# Patient Record
Sex: Female | Born: 1950 | Race: White | Hispanic: No | Marital: Married | Smoking: Never smoker
Health system: Southern US, Community
[De-identification: ages and names within clinical notes are randomized; demographics above are authoritative.]

## PROBLEM LIST (undated history)

## (undated) DIAGNOSIS — R112 Nausea with vomiting, unspecified: Secondary | ICD-10-CM

## (undated) DIAGNOSIS — Z860100 Personal history of colon polyps, unspecified: Secondary | ICD-10-CM

## (undated) DIAGNOSIS — M419 Scoliosis, unspecified: Secondary | ICD-10-CM

## (undated) DIAGNOSIS — G4733 Obstructive sleep apnea (adult) (pediatric): Secondary | ICD-10-CM

## (undated) DIAGNOSIS — Z9889 Other specified postprocedural states: Secondary | ICD-10-CM

## (undated) DIAGNOSIS — F329 Major depressive disorder, single episode, unspecified: Secondary | ICD-10-CM

## (undated) DIAGNOSIS — Z8619 Personal history of other infectious and parasitic diseases: Secondary | ICD-10-CM

## (undated) DIAGNOSIS — J690 Pneumonitis due to inhalation of food and vomit: Secondary | ICD-10-CM

## (undated) DIAGNOSIS — Z8669 Personal history of other diseases of the nervous system and sense organs: Secondary | ICD-10-CM

## (undated) DIAGNOSIS — K219 Gastro-esophageal reflux disease without esophagitis: Secondary | ICD-10-CM

## (undated) DIAGNOSIS — R519 Headache, unspecified: Secondary | ICD-10-CM

## (undated) DIAGNOSIS — I890 Lymphedema, not elsewhere classified: Secondary | ICD-10-CM

## (undated) DIAGNOSIS — F419 Anxiety disorder, unspecified: Secondary | ICD-10-CM

## (undated) DIAGNOSIS — F32A Depression, unspecified: Secondary | ICD-10-CM

## (undated) DIAGNOSIS — T7840XA Allergy, unspecified, initial encounter: Secondary | ICD-10-CM

## (undated) DIAGNOSIS — I10 Essential (primary) hypertension: Secondary | ICD-10-CM

## (undated) DIAGNOSIS — G35 Multiple sclerosis: Secondary | ICD-10-CM

## (undated) DIAGNOSIS — R51 Headache: Secondary | ICD-10-CM

## (undated) HISTORY — DX: Obstructive sleep apnea (adult) (pediatric): G47.33

## (undated) HISTORY — DX: Personal history of other infectious and parasitic diseases: Z86.19

## (undated) HISTORY — DX: Depression, unspecified: F32.A

## (undated) HISTORY — DX: Personal history of other diseases of the nervous system and sense organs: Z86.69

## (undated) HISTORY — DX: Major depressive disorder, single episode, unspecified: F32.9

## (undated) HISTORY — DX: Allergy, unspecified, initial encounter: T78.40XA

## (undated) HISTORY — DX: Headache, unspecified: R51.9

## (undated) HISTORY — DX: Multiple sclerosis: G35

## (undated) HISTORY — PX: BACK SURGERY: SHX140

## (undated) HISTORY — DX: Personal history of colonic polyps: Z86.010

## (undated) HISTORY — PX: CHOLECYSTECTOMY: SHX55

## (undated) HISTORY — DX: Headache: R51

## (undated) HISTORY — PX: HERNIA REPAIR: SHX51

## (undated) HISTORY — DX: Personal history of colon polyps, unspecified: Z86.0100

---

## 1999-12-18 ENCOUNTER — Ambulatory Visit (HOSPITAL_COMMUNITY): Admission: RE | Admit: 1999-12-18 | Discharge: 1999-12-18 | Payer: Self-pay | Admitting: Gastroenterology

## 2004-08-13 ENCOUNTER — Ambulatory Visit: Payer: Self-pay | Admitting: Unknown Physician Specialty

## 2005-06-26 ENCOUNTER — Ambulatory Visit: Payer: Self-pay | Admitting: Unknown Physician Specialty

## 2005-07-24 ENCOUNTER — Ambulatory Visit: Payer: Self-pay | Admitting: Unknown Physician Specialty

## 2005-09-30 ENCOUNTER — Ambulatory Visit: Payer: Self-pay | Admitting: Unknown Physician Specialty

## 2005-11-14 ENCOUNTER — Ambulatory Visit: Payer: Self-pay | Admitting: Unknown Physician Specialty

## 2006-10-13 HISTORY — PX: GALLBLADDER SURGERY: SHX652

## 2006-12-08 ENCOUNTER — Ambulatory Visit: Payer: Self-pay | Admitting: Unknown Physician Specialty

## 2007-08-13 ENCOUNTER — Ambulatory Visit: Payer: Self-pay | Admitting: Family Medicine

## 2007-08-13 ENCOUNTER — Inpatient Hospital Stay: Payer: Self-pay | Admitting: Surgery

## 2007-12-21 ENCOUNTER — Ambulatory Visit: Payer: Self-pay | Admitting: Family Medicine

## 2008-02-17 ENCOUNTER — Ambulatory Visit: Payer: Self-pay | Admitting: Surgery

## 2009-01-10 ENCOUNTER — Ambulatory Visit: Payer: Self-pay | Admitting: Unknown Physician Specialty

## 2009-10-13 DIAGNOSIS — G35 Multiple sclerosis: Secondary | ICD-10-CM

## 2009-10-13 HISTORY — DX: Multiple sclerosis: G35

## 2010-01-24 ENCOUNTER — Ambulatory Visit: Payer: Self-pay | Admitting: Unknown Physician Specialty

## 2011-03-04 ENCOUNTER — Ambulatory Visit: Payer: Self-pay | Admitting: Unknown Physician Specialty

## 2011-06-04 DIAGNOSIS — M79609 Pain in unspecified limb: Secondary | ICD-10-CM | POA: Insufficient documentation

## 2011-06-04 DIAGNOSIS — M545 Low back pain, unspecified: Secondary | ICD-10-CM | POA: Insufficient documentation

## 2011-06-05 DIAGNOSIS — G35 Multiple sclerosis: Secondary | ICD-10-CM | POA: Insufficient documentation

## 2011-07-15 ENCOUNTER — Ambulatory Visit: Payer: Self-pay | Admitting: Internal Medicine

## 2011-08-27 DIAGNOSIS — F32A Depression, unspecified: Secondary | ICD-10-CM | POA: Insufficient documentation

## 2012-01-28 DIAGNOSIS — Z0000 Encounter for general adult medical examination without abnormal findings: Secondary | ICD-10-CM | POA: Insufficient documentation

## 2012-01-28 DIAGNOSIS — E559 Vitamin D deficiency, unspecified: Secondary | ICD-10-CM | POA: Insufficient documentation

## 2012-01-28 DIAGNOSIS — E7800 Pure hypercholesterolemia, unspecified: Secondary | ICD-10-CM | POA: Insufficient documentation

## 2012-01-28 DIAGNOSIS — N951 Menopausal and female climacteric states: Secondary | ICD-10-CM | POA: Insufficient documentation

## 2012-02-25 LAB — HM PAP SMEAR: HM Pap smear: NEGATIVE

## 2012-03-04 ENCOUNTER — Ambulatory Visit: Payer: Self-pay | Admitting: Internal Medicine

## 2012-03-08 ENCOUNTER — Ambulatory Visit: Payer: Self-pay | Admitting: Internal Medicine

## 2012-03-31 DIAGNOSIS — E559 Vitamin D deficiency, unspecified: Secondary | ICD-10-CM | POA: Insufficient documentation

## 2012-07-28 ENCOUNTER — Ambulatory Visit: Payer: Self-pay | Admitting: Internal Medicine

## 2012-09-06 DIAGNOSIS — M549 Dorsalgia, unspecified: Secondary | ICD-10-CM | POA: Insufficient documentation

## 2012-09-06 DIAGNOSIS — I10 Essential (primary) hypertension: Secondary | ICD-10-CM | POA: Insufficient documentation

## 2012-10-05 ENCOUNTER — Other Ambulatory Visit: Payer: Self-pay | Admitting: Anesthesiology

## 2012-10-11 ENCOUNTER — Other Ambulatory Visit: Payer: Self-pay | Admitting: Neurosurgery

## 2012-10-11 ENCOUNTER — Ambulatory Visit
Admission: RE | Admit: 2012-10-11 | Discharge: 2012-10-11 | Disposition: A | Discharge status: Home / Self Care | Payer: BC Managed Care – PPO | Source: Ambulatory Visit | Attending: Neurosurgery | Admitting: Neurosurgery

## 2012-10-11 DIAGNOSIS — IMO0002 Reserved for concepts with insufficient information to code with codable children: Secondary | ICD-10-CM

## 2012-10-11 MED ORDER — IOHEXOL 300 MG/ML  SOLN
30.0000 mL | Freq: Once | INTRAMUSCULAR | Status: AC | PRN
  Administered 2012-10-11: 30 mL via ORAL

## 2012-10-11 MED ORDER — IOHEXOL 300 MG/ML  SOLN
100.0000 mL | Freq: Once | INTRAMUSCULAR | Status: AC | PRN
  Administered 2012-10-11: 100 mL via INTRAVENOUS

## 2012-10-13 HISTORY — PX: SPINE SURGERY: SHX786

## 2012-10-15 DIAGNOSIS — M4150 Other secondary scoliosis, site unspecified: Secondary | ICD-10-CM | POA: Insufficient documentation

## 2012-10-15 DIAGNOSIS — M51379 Other intervertebral disc degeneration, lumbosacral region without mention of lumbar back pain or lower extremity pain: Secondary | ICD-10-CM | POA: Insufficient documentation

## 2012-10-15 DIAGNOSIS — M5137 Other intervertebral disc degeneration, lumbosacral region: Secondary | ICD-10-CM | POA: Insufficient documentation

## 2012-12-03 DIAGNOSIS — Z419 Encounter for procedure for purposes other than remedying health state, unspecified: Secondary | ICD-10-CM | POA: Insufficient documentation

## 2012-12-31 ENCOUNTER — Encounter: Payer: Self-pay | Admitting: Neurosurgery

## 2013-01-03 ENCOUNTER — Ambulatory Visit: Payer: Self-pay | Admitting: Hematology and Oncology

## 2013-01-04 DIAGNOSIS — Z981 Arthrodesis status: Secondary | ICD-10-CM | POA: Insufficient documentation

## 2013-01-11 ENCOUNTER — Ambulatory Visit: Payer: Self-pay | Admitting: Hematology and Oncology

## 2013-01-11 ENCOUNTER — Encounter: Payer: Self-pay | Admitting: Neurosurgery

## 2013-02-10 ENCOUNTER — Ambulatory Visit: Payer: Self-pay | Admitting: Hematology and Oncology

## 2013-02-10 ENCOUNTER — Encounter: Payer: Self-pay | Admitting: Neurosurgery

## 2013-03-09 ENCOUNTER — Ambulatory Visit: Payer: Self-pay | Admitting: Internal Medicine

## 2013-03-11 ENCOUNTER — Ambulatory Visit: Payer: Self-pay | Admitting: Internal Medicine

## 2013-03-13 ENCOUNTER — Ambulatory Visit: Payer: Self-pay | Admitting: Hematology and Oncology

## 2013-03-13 ENCOUNTER — Encounter: Payer: Self-pay | Admitting: Neurosurgery

## 2013-03-15 DIAGNOSIS — R922 Inconclusive mammogram: Secondary | ICD-10-CM | POA: Insufficient documentation

## 2013-04-12 ENCOUNTER — Ambulatory Visit: Payer: Self-pay | Admitting: Hematology and Oncology

## 2013-04-12 ENCOUNTER — Encounter: Payer: Self-pay | Admitting: Neurosurgery

## 2013-05-13 ENCOUNTER — Ambulatory Visit: Payer: Self-pay | Admitting: Hematology and Oncology

## 2013-06-13 ENCOUNTER — Ambulatory Visit: Payer: Self-pay | Admitting: Hematology and Oncology

## 2013-07-13 ENCOUNTER — Ambulatory Visit: Payer: Self-pay | Admitting: Hematology and Oncology

## 2013-08-13 ENCOUNTER — Ambulatory Visit: Payer: Self-pay | Admitting: Hematology and Oncology

## 2013-09-12 ENCOUNTER — Ambulatory Visit: Payer: Self-pay | Admitting: Hematology and Oncology

## 2013-09-13 ENCOUNTER — Ambulatory Visit: Payer: Self-pay

## 2013-10-13 ENCOUNTER — Ambulatory Visit: Payer: Self-pay | Admitting: Hematology and Oncology

## 2013-10-13 HISTORY — PX: FOOT SURGERY: SHX648

## 2013-11-13 ENCOUNTER — Ambulatory Visit: Payer: Self-pay | Admitting: Hematology and Oncology

## 2013-12-11 ENCOUNTER — Ambulatory Visit: Payer: Self-pay | Admitting: Hematology and Oncology

## 2014-01-11 ENCOUNTER — Ambulatory Visit: Payer: Self-pay | Admitting: Hematology and Oncology

## 2014-01-30 LAB — POTASSIUM: Potassium: 3.7 mmol/L (ref 3.5–5.1)

## 2014-02-10 ENCOUNTER — Ambulatory Visit: Payer: Self-pay | Admitting: Hematology and Oncology

## 2014-03-13 ENCOUNTER — Ambulatory Visit: Payer: Self-pay | Admitting: Hematology and Oncology

## 2014-03-24 ENCOUNTER — Ambulatory Visit: Payer: Self-pay | Admitting: Family Medicine

## 2014-03-24 LAB — TSH: TSH: 1.97 u[IU]/mL (ref 0.41–5.90)

## 2014-03-24 LAB — LIPID PANEL
CHOLESTEROL: 274 mg/dL — AB (ref 0–200)
HDL: 75 mg/dL — AB (ref 35–70)
LDL CALC: 175 mg/dL
Triglycerides: 122 mg/dL (ref 40–160)

## 2014-03-24 LAB — HEPATIC FUNCTION PANEL
ALK PHOS: 72 U/L (ref 25–125)
ALT: 16 U/L (ref 7–35)
AST: 17 U/L (ref 13–35)
BILIRUBIN, TOTAL: 0.5 mg/dL

## 2014-03-24 LAB — BASIC METABOLIC PANEL
BUN: 9 mg/dL (ref 4–21)
Creatinine: 0.8 mg/dL (ref 0.5–1.1)
Glucose: 105 mg/dL
Potassium: 4.7 mmol/L (ref 3.4–5.3)
Sodium: 140 mmol/L (ref 137–147)

## 2014-03-24 LAB — HEMOGLOBIN A1C: Hgb A1c MFr Bld: 5.2 % (ref 4.0–6.0)

## 2014-03-24 LAB — HM MAMMOGRAPHY: HM Mammogram: NORMAL

## 2014-03-25 LAB — BASIC METABOLIC PANEL
BUN: 9 mg/dL (ref 4–21)
Creatinine: 0.8 mg/dL (ref 0.5–1.1)
Glucose: 105 mg/dL
POTASSIUM: 4.7 mmol/L (ref 3.4–5.3)
SODIUM: 140 mmol/L (ref 137–147)

## 2014-03-25 LAB — HEPATIC FUNCTION PANEL
ALK PHOS: 72 U/L (ref 25–125)
ALT: 16 U/L (ref 7–35)
AST: 17 U/L (ref 13–35)
BILIRUBIN, TOTAL: 0.5 mg/dL

## 2014-03-25 LAB — LIPID PANEL
Cholesterol: 274 mg/dL — AB (ref 0–200)
HDL: 75 mg/dL — AB (ref 35–70)
LDL CALC: 175 mg/dL
Triglycerides: 122 mg/dL (ref 40–160)

## 2014-03-25 LAB — HEMOGLOBIN A1C: Hgb A1c MFr Bld: 5.2 % (ref 4.0–6.0)

## 2014-03-25 LAB — TSH: TSH: 1.97 u[IU]/mL (ref 0.41–5.90)

## 2014-04-12 ENCOUNTER — Ambulatory Visit: Payer: Self-pay | Admitting: Hematology and Oncology

## 2014-05-13 ENCOUNTER — Ambulatory Visit: Payer: Self-pay | Admitting: Hematology and Oncology

## 2014-05-24 ENCOUNTER — Ambulatory Visit: Payer: Self-pay | Admitting: Hematology and Oncology

## 2014-06-13 ENCOUNTER — Ambulatory Visit: Payer: Self-pay | Admitting: Hematology and Oncology

## 2014-06-15 ENCOUNTER — Other Ambulatory Visit: Payer: Self-pay | Admitting: Psychiatry

## 2014-06-15 DIAGNOSIS — R413 Other amnesia: Secondary | ICD-10-CM

## 2014-06-16 ENCOUNTER — Ambulatory Visit
Admission: RE | Admit: 2014-06-16 | Discharge: 2014-06-16 | Disposition: A | Discharge status: Home / Self Care | Payer: BC Managed Care – PPO | Source: Ambulatory Visit | Attending: Psychiatry | Admitting: Psychiatry

## 2014-06-16 DIAGNOSIS — R413 Other amnesia: Secondary | ICD-10-CM

## 2014-06-16 MED ORDER — GADOBENATE DIMEGLUMINE 529 MG/ML IV SOLN
15.0000 mL | Freq: Once | INTRAVENOUS | Status: AC | PRN
  Administered 2014-06-16: 15 mL via INTRAVENOUS

## 2014-06-23 ENCOUNTER — Other Ambulatory Visit: Payer: BC Managed Care – PPO

## 2014-07-13 ENCOUNTER — Ambulatory Visit: Payer: Self-pay

## 2014-07-13 ENCOUNTER — Ambulatory Visit: Payer: Self-pay | Admitting: Hematology and Oncology

## 2014-08-02 LAB — BASIC METABOLIC PANEL
BUN: 7 mg/dL (ref 4–21)
Creatinine: 0.7 mg/dL (ref 0.5–1.1)
Glucose: 105 mg/dL
Potassium: 4.5 mmol/L (ref 3.4–5.3)
Sodium: 141 mmol/L (ref 137–147)

## 2014-08-02 LAB — HEPATIC FUNCTION PANEL
ALK PHOS: 80 U/L (ref 25–125)
ALT: 21 U/L (ref 7–35)
AST: 21 U/L (ref 13–35)
Bilirubin, Total: 0.3 mg/dL

## 2014-08-13 ENCOUNTER — Ambulatory Visit: Payer: Self-pay | Admitting: Hematology and Oncology

## 2014-08-13 ENCOUNTER — Ambulatory Visit: Payer: Self-pay

## 2014-08-30 ENCOUNTER — Ambulatory Visit: Payer: BC Managed Care – PPO | Admitting: Internal Medicine

## 2014-09-12 ENCOUNTER — Ambulatory Visit: Payer: Self-pay | Admitting: Hematology and Oncology

## 2014-09-18 LAB — COMPREHENSIVE METABOLIC PANEL
ANION GAP: 10 (ref 7–16)
AST: 23 U/L (ref 15–37)
Albumin: 4 g/dL (ref 3.4–5.0)
Alkaline Phosphatase: 161 U/L — ABNORMAL HIGH
BUN: 9 mg/dL (ref 7–18)
Bilirubin,Total: 0.3 mg/dL (ref 0.2–1.0)
CREATININE: 0.83 mg/dL (ref 0.60–1.30)
Calcium, Total: 9.5 mg/dL (ref 8.5–10.1)
Chloride: 101 mmol/L (ref 98–107)
Co2: 27 mmol/L (ref 21–32)
EGFR (African American): >60
EGFR (Non-African Amer.): >60
GLUCOSE: 88 mg/dL (ref 65–99)
Osmolality: 274 (ref 275–301)
Potassium: 4 mmol/L (ref 3.5–5.1)
SGPT (ALT): 42 U/L
Sodium: 138 mmol/L (ref 136–145)
TOTAL PROTEIN: 7.8 g/dL (ref 6.4–8.2)

## 2014-09-18 LAB — CBC CANCER CENTER
Basophil #: 0 x10 3/mm (ref 0.0–0.1)
Basophil %: 0.6 %
EOS PCT: 4.2 %
Eosinophil #: 0.3 x10 3/mm (ref 0.0–0.7)
HCT: 41.1 % (ref 35.0–47.0)
HGB: 13.6 g/dL (ref 12.0–16.0)
Lymphocyte #: 2.9 x10 3/mm (ref 1.0–3.6)
Lymphocyte %: 38.4 %
MCH: 28.1 pg (ref 26.0–34.0)
MCHC: 33.1 g/dL (ref 32.0–36.0)
MCV: 85 fL (ref 80–100)
MONO ABS: 0.6 x10 3/mm (ref 0.2–0.9)
Monocyte %: 7.8 %
NEUTROS ABS: 3.7 x10 3/mm (ref 1.4–6.5)
Neutrophil %: 49 %
PLATELETS: 264 x10 3/mm (ref 150–440)
RBC: 4.84 10*6/uL (ref 3.80–5.20)
RDW: 15.1 % — ABNORMAL HIGH (ref 11.5–14.5)
WBC: 7.6 x10 3/mm (ref 3.6–11.0)

## 2014-09-18 LAB — CREATININE, SERUM: CREATINE, SERUM: 0.83

## 2014-09-18 LAB — TSH: Thyroid Stimulating Horm: 1.75 u[IU]/mL

## 2014-09-23 LAB — AEROBIC CULTURE

## 2014-10-09 LAB — CULTURE, FUNGUS WITHOUT SMEAR

## 2014-10-13 ENCOUNTER — Ambulatory Visit: Payer: Self-pay | Admitting: Hematology and Oncology

## 2014-11-13 ENCOUNTER — Ambulatory Visit: Payer: Self-pay | Admitting: Hematology and Oncology

## 2014-11-14 ENCOUNTER — Encounter (INDEPENDENT_AMBULATORY_CARE_PROVIDER_SITE_OTHER): Payer: Self-pay

## 2014-11-14 ENCOUNTER — Ambulatory Visit (INDEPENDENT_AMBULATORY_CARE_PROVIDER_SITE_OTHER): Payer: BC Managed Care – PPO | Admitting: Internal Medicine

## 2014-11-14 ENCOUNTER — Encounter: Payer: Self-pay | Admitting: Internal Medicine

## 2014-11-14 VITALS — BP 138/90 | HR 100 | Temp 98.5°F | Ht 63.0 in | Wt 165.0 lb

## 2014-11-14 DIAGNOSIS — G35 Multiple sclerosis: Secondary | ICD-10-CM

## 2014-11-14 DIAGNOSIS — R2681 Unsteadiness on feet: Secondary | ICD-10-CM

## 2014-11-14 DIAGNOSIS — R51 Headache: Secondary | ICD-10-CM

## 2014-11-14 DIAGNOSIS — S42251D Displaced fracture of greater tuberosity of right humerus, subsequent encounter for fracture with routine healing: Secondary | ICD-10-CM

## 2014-11-14 DIAGNOSIS — R519 Headache, unspecified: Secondary | ICD-10-CM

## 2014-11-14 DIAGNOSIS — F32A Depression, unspecified: Secondary | ICD-10-CM

## 2014-11-14 DIAGNOSIS — R42 Dizziness and giddiness: Secondary | ICD-10-CM

## 2014-11-14 DIAGNOSIS — F329 Major depressive disorder, single episode, unspecified: Secondary | ICD-10-CM

## 2014-11-14 NOTE — Progress Notes (Signed)
Pre visit review using our clinic review tool, if applicable. No additional management support is needed unless otherwise documented below in the visit note. 

## 2014-11-18 ENCOUNTER — Encounter: Payer: Self-pay | Admitting: Internal Medicine

## 2014-11-18 ENCOUNTER — Telehealth: Payer: Self-pay | Admitting: Internal Medicine

## 2014-11-18 DIAGNOSIS — R2681 Unsteadiness on feet: Secondary | ICD-10-CM | POA: Insufficient documentation

## 2014-11-18 DIAGNOSIS — G35 Multiple sclerosis: Secondary | ICD-10-CM | POA: Insufficient documentation

## 2014-11-18 DIAGNOSIS — R269 Unspecified abnormalities of gait and mobility: Secondary | ICD-10-CM | POA: Insufficient documentation

## 2014-11-18 DIAGNOSIS — R519 Headache, unspecified: Secondary | ICD-10-CM | POA: Insufficient documentation

## 2014-11-18 DIAGNOSIS — R42 Dizziness and giddiness: Secondary | ICD-10-CM | POA: Insufficient documentation

## 2014-11-18 DIAGNOSIS — S42253A Displaced fracture of greater tuberosity of unspecified humerus, initial encounter for closed fracture: Secondary | ICD-10-CM | POA: Insufficient documentation

## 2014-11-18 DIAGNOSIS — F339 Major depressive disorder, recurrent, unspecified: Secondary | ICD-10-CM | POA: Insufficient documentation

## 2014-11-18 DIAGNOSIS — R51 Headache: Secondary | ICD-10-CM

## 2014-11-18 NOTE — Assessment & Plan Note (Signed)
She relates to the wellbutrin.  States is better since tapering.  Declines any further testing or scanning at this point.  Wants to see how she feels off the wellbutrin.

## 2014-11-18 NOTE — Assessment & Plan Note (Signed)
Recent fracture s/p fall.  Seeing ortho.

## 2014-11-18 NOTE — Telephone Encounter (Signed)
Pt needs a f/u appt with me in 2 months.  (please scheduled her for 01/17/15 at 11:30).  Thanks.

## 2014-11-18 NOTE — Assessment & Plan Note (Signed)
Sees Dr Renford Dills.   With the unsteady gait and recent falls, need to make sure her MS not contributing.  Get an earlier appt with Dr Dellis Filbert for evaluation.  She declines any further testing at this point.

## 2014-11-18 NOTE — Assessment & Plan Note (Signed)
She relates to the wellbutrin.  Discussed at length.  She declines further testing or scanning.  Wants to see if symptoms improve with stopping the wellbutrin.  Will get an earlier appt with Dr Dellis Filbert.

## 2014-11-18 NOTE — Assessment & Plan Note (Signed)
She relates this to the wellbutrin.  She is tapering the dose.  Discussed at length with her today.  Discussed my concern over another possible etiology (i.e., MS, other neurological event, etc).  She declines further w/up or scanning at this point.  Due to stop the wellbutrin today.  Will call Dr Nicolasa Ducking in a few days with an update.  Will get an earlier appt with Dr Dellis Filbert.

## 2014-11-18 NOTE — Progress Notes (Signed)
Patient ID: Carly [name], female   DOB: [birth year], 64 y.o.   MRN: [id number]   Subjective:    Patient ID: Carly [name], female    DOB: 15-May-1951, 64 y.o.   MRN: [id number]  HPI  Patient here to establish care.  She has a history of depression and multiple sclerosis.  Previously followed by Dr Dear.  Also followed by Dr Nicolasa Ducking for her depression.  On zoloft.  Recently had wellbutrin added to  Her medication regimen.  States went up to 300mg  on 10/09/14.  On 10/13/14 - fell.  Fractured her right shoulder (greater tuberosity of humerus).  She also fell Thanksgiving.  [place] again this past week.  States she feels unsteady.  No room spinning dizziness.  Does report problems if she turns fast.  No head injury.  She feels her current symptoms are related to the wellbutrin.  She discussed with Dr Nicolasa Ducking and she is tapering the dose.  This is the last day.  She is supposed to call her with an update in a few days.  She has MS.  Followed by Dr Dellis Filbert.   States she discussed falls (and current symptoms) with him and that he did not feel was related to her MS.  Was initially diagnosed at age 67.  She does report some headache.  Again she attributes to the wellbutrin.  Some nausea, but is eating.  Sees Dr Richardson Landry.     Past Medical History  Diagnosis Date  . Depression   . History of chicken pox   . Frequent headaches     H/O  . Allergy   . Hx of migraines   . History of colon polyps   . Multiple sclerosis     Outpatient Encounter Prescriptions as of 11/14/2014  Medication Sig  . acetaminophen (TYLENOL) 500 MG tablet Take 500 mg by mouth every 6 (six) hours as needed.  . ALPRAZolam (XANAX) 0.5 MG tablet Take by mouth.  . Ascorbic Acid (VITAMIN C PO) Take by mouth daily.  . Calcium Carbonate-Vitamin D 600-400 MG-UNIT per tablet Take by mouth.  . Cholecalciferol (VITAMIN D-3) 1000 UNITS CAPS Take by mouth daily.  . Coenzyme Q10 (CO Q 10) 100 MG CAPS Take by mouth daily.  Marland Kitchen dalfampridine 10 MG  TB12 Take by mouth.  . diphenhydrAMINE (BENADRYL) 25 mg capsule Take 25 mg by mouth as needed.  Marland Kitchen estradiol (VIVELLE-DOT) 0.0375 MG/24HR   . ibuprofen (ADVIL,MOTRIN) 200 MG tablet Take 200 mg by mouth every 6 (six) hours as needed.  . Lactobacillus-Inulin ([place] PO) Take by mouth daily.  . Multiple Vitamins-Minerals (CENTRUM ADULTS PO) Take by mouth.  . natalizumab (TYSABRI) 300 MG/15ML injection Inject into the vein.  . progesterone (PROMETRIUM) 100 MG capsule   . pseudoephedrine (SUDAFED) 30 MG tablet Take 30 mg by mouth every 4 (four) hours as needed for congestion.  . sertraline (ZOLOFT) 100 MG tablet Take 200 mg by mouth daily.  . vitamin B-12 (CYANOCOBALAMIN) 1000 MCG tablet Take 1,000 mcg by mouth daily.    Review of Systems  Constitutional: Positive for fatigue. Negative for appetite change and unexpected weight change.  HENT: Negative for congestion, sinus pressure and sore throat.   Eyes: Negative for pain and visual disturbance.  Respiratory: Negative for cough, chest tightness and shortness of breath.   Cardiovascular: Negative for chest pain, palpitations and leg swelling.  Gastrointestinal: Positive for nausea. Negative for vomiting, abdominal pain, diarrhea and constipation.  States she is eating well.    Genitourinary: Negative for frequency and difficulty urinating.  Musculoskeletal: Negative for joint swelling.       Reports right shoulder pain s/p fall.  S/p fracture.    Skin: Negative for color change and rash.  Neurological: Positive for dizziness (states started when she started the wellbutrin.  recent falls.  ), light-headedness and headaches.  Hematological: Negative for adenopathy. Does not bruise/bleed easily.  Psychiatric/Behavioral: Negative for agitation. The patient is nervous/anxious.        Has a history of depression.  On zoloft.  Recently had wellbutrin added.  Feels currently symptoms related to the wellbutrin.  Tapering.  To  stop today.         Objective:     Blood pressure recheck:  130/86  Physical Exam  Constitutional: She is oriented to person, place, and time. She appears well-developed and well-nourished.  HENT:  Nose: Nose normal.  Mouth/Throat: Oropharynx is clear and moist.  Eyes: Right eye exhibits no discharge. Left eye exhibits no discharge. No scleral icterus.  Neck: Neck supple. No thyromegaly present.  Cardiovascular: Normal rate and regular rhythm.   Pulmonary/Chest: Breath sounds normal. No accessory muscle usage. No tachypnea. No respiratory distress. She has no decreased breath sounds. She has no wheezes. She has no rhonchi. Right breast exhibits no inverted nipple, no mass, no nipple discharge and no tenderness (no axillary adenopathy). Left breast exhibits no inverted nipple, no mass, no nipple discharge and no tenderness (no axilarry adenopathy).  Abdominal: Soft. Bowel sounds are normal. There is no tenderness.  Musculoskeletal: She exhibits no edema or tenderness.  Lymphadenopathy:    She has no cervical adenopathy.  Neurological: She is alert and oriented to person, place, and time.  Unsteady gait.  Motor strength appears to be equal bilateral lower extremities.  No focal weakness found.    Skin: Skin is warm. No rash noted.  Psychiatric: She has a normal mood and affect. Her behavior is normal.    BP 138/90 mmHg  Pulse 100  Temp(Src) 98.5 F (36.9 C) (Oral)  Ht 5\' 3"  (1.6 m)  Wt 165 lb (74.844 kg)  BMI 29.24 kg/m2  SpO2 97% Wt Readings from Last 3 Encounters:  11/14/14 165 lb (74.844 kg)     Lab Results  Component Value Date   CHOL 274* 03/25/2014   TRIG 122 03/25/2014   HDL 75* 03/25/2014   LDLCALC 175 03/25/2014   ALT 16 03/25/2014   AST 17 03/25/2014   NA 140 03/25/2014   K 4.7 03/25/2014   CREATININE 0.8 03/25/2014   BUN 9 03/25/2014   TSH 1.97 03/25/2014   HGBA1C 5.2 03/25/2014    Mr Brain W Wo Contrast  06/17/2014   CLINICAL DATA:  Memory loss.   History of multiple sclerosis.  EXAM: MRI HEAD WITHOUT AND WITH CONTRAST  TECHNIQUE: Multiplanar, multiecho pulse sequences of the brain and surrounding structures were obtained without and with intravenous contrast.  CONTRAST:  69mL MULTIHANCE GADOBENATE DIMEGLUMINE 529 MG/ML IV SOLN  COMPARISON:  01/07/2011  FINDINGS: Partially empty sella is again noted. There is no evidence of acute infarct, intracranial hemorrhage, mass, midline shift, or extra-axial fluid collection. Extensive, confluent T2 hyperintensities throughout the bilateral cerebral white matter, greatest in the periventricular regions, do not appear significantly changed from the prior study. Multiple foci of prominent T1 hypointensity are seen in these regions. Abnormal T2 hyperintensity is also present involving the corpus callosum and along the callososeptal interface. Patchy T2  hyperintensity is present in the brainstem, also not significantly changed. Mild to moderate cerebral atrophy is unchanged. No enhancing lesions are identified.  Orbits are unremarkable. Paranasal sinuses and mastoid air cells are clear. Major intracranial vascular flow voids are preserved.  IMPRESSION: 1. No acute intracranial abnormality or mass. 2. Unchanged, extensive white matter disease consistent with multiple sclerosis. No enhancing lesions seen to suggest active demyelination.   Electronically Signed   By: Logan Bores   On: 06/17/2014 13:33       Assessment & Plan:   Problem List Items Addressed This Visit    Depression    On zoloft.  Seeing Dr Nicolasa Ducking.  Tapering off wellbutrin.  Discussed with her today the above clinical presentation.  She is to call with update in a few days.        Relevant Medications   ALPRAZolam (XANAX) 0.5 MG tablet   sertraline (ZOLOFT) 100 MG tablet   Dizziness - Primary    She relates to the wellbutrin.  Discussed at length.  She declines further testing or scanning.  Wants to see if symptoms improve with stopping the  wellbutrin.  Will get an earlier appt with Dr Dellis Filbert.        Relevant Orders   CBC with Differential/Platelet   Comprehensive metabolic panel   TSH   Ambulatory referral to Neurology   Greater tuberosity of humerus fracture    Recent fracture s/p fall.  Seeing ortho.        Headache    She relates to the wellbutrin.  States is better since tapering.  Declines any further testing or scanning at this point.  Wants to see how she feels off the wellbutrin.        Relevant Medications   sertraline (ZOLOFT) 100 MG tablet   ibuprofen (ADVIL,MOTRIN) 200 MG tablet   acetaminophen (TYLENOL) 500 MG tablet   Multiple sclerosis    Sees Dr Renford Dills.   With the unsteady gait and recent falls, need to make sure her MS not contributing.  Get an earlier appt with Dr Dellis Filbert for evaluation.  She declines any further testing at this point.        Relevant Medications   natalizumab (TYSABRI) 300 MG/15ML injection   dalfampridine 10 MG TB12   Other Relevant Orders   Ambulatory referral to Neurology   Unsteady gait    She relates this to the wellbutrin.  She is tapering the dose.  Discussed at length with her today.  Discussed my concern over another possible etiology (i.e., MS, other neurological event, etc).  She declines further w/up or scanning at this point.  Due to stop the wellbutrin today.  Will call Dr Nicolasa Ducking in a few days with an update.  Will get an earlier appt with Dr Dellis Filbert.        Relevant Orders   Ambulatory referral to Neurology     I spent 45 minutes with the patient and more than 50% of the time was spent in consultation regarding the above.     Einar Pheasant, MD

## 2014-11-18 NOTE — Assessment & Plan Note (Signed)
On zoloft.  Seeing Dr Nicolasa Ducking.  Tapering off wellbutrin.  Discussed with her today the above clinical presentation.  She is to call with update in a few days.

## 2014-12-12 ENCOUNTER — Ambulatory Visit
Admit: 2014-12-12 | Disposition: A | Discharge status: Home / Self Care | Payer: Self-pay | Attending: Hematology and Oncology | Admitting: Hematology and Oncology

## 2015-01-01 ENCOUNTER — Encounter: Payer: Self-pay | Admitting: Internal Medicine

## 2015-01-12 ENCOUNTER — Ambulatory Visit
Admit: 2015-01-12 | Disposition: A | Discharge status: Home / Self Care | Payer: Self-pay | Attending: Family Medicine | Admitting: Family Medicine

## 2015-01-12 ENCOUNTER — Ambulatory Visit
Admit: 2015-01-12 | Disposition: A | Discharge status: Home / Self Care | Payer: Self-pay | Attending: Hematology and Oncology | Admitting: Hematology and Oncology

## 2015-01-17 ENCOUNTER — Ambulatory Visit: Payer: BC Managed Care – PPO | Admitting: Internal Medicine

## 2015-01-25 DIAGNOSIS — K635 Polyp of colon: Secondary | ICD-10-CM | POA: Insufficient documentation

## 2015-01-25 DIAGNOSIS — M949 Disorder of cartilage, unspecified: Secondary | ICD-10-CM

## 2015-01-25 DIAGNOSIS — M899 Disorder of bone, unspecified: Secondary | ICD-10-CM | POA: Insufficient documentation

## 2015-02-14 ENCOUNTER — Inpatient Hospital Stay: Payer: BC Managed Care – PPO | Attending: Hematology and Oncology

## 2015-02-14 ENCOUNTER — Other Ambulatory Visit: Payer: Self-pay | Admitting: Family Medicine

## 2015-02-14 VITALS — BP 116/61 | HR 81 | Temp 97.2°F | Resp 20

## 2015-02-14 DIAGNOSIS — Z79899 Other long term (current) drug therapy: Secondary | ICD-10-CM | POA: Insufficient documentation

## 2015-02-14 DIAGNOSIS — G35 Multiple sclerosis: Secondary | ICD-10-CM | POA: Diagnosis not present

## 2015-02-14 MED ORDER — NATALIZUMAB 300 MG/15ML IV CONC
300.0000 mg | Freq: Once | INTRAVENOUS | Status: AC
  Administered 2015-02-14: 300 mg via INTRAVENOUS
  Filled 2015-02-14: qty 15

## 2015-02-14 MED ORDER — ACETAMINOPHEN 500 MG PO TABS
1000.0000 mg | ORAL_TABLET | Freq: Once | ORAL | Status: AC
  Administered 2015-02-14: 1000 mg via ORAL
  Filled 2015-02-14: qty 2

## 2015-02-14 MED ORDER — SODIUM CHLORIDE 0.9 % IV SOLN
Freq: Once | INTRAVENOUS | Status: AC
  Administered 2015-02-14: 12:00:00 via INTRAVENOUS
  Filled 2015-02-14: qty 250

## 2015-03-06 ENCOUNTER — Telehealth: Payer: Self-pay | Admitting: Internal Medicine

## 2015-03-06 ENCOUNTER — Ambulatory Visit (INDEPENDENT_AMBULATORY_CARE_PROVIDER_SITE_OTHER): Payer: BC Managed Care – PPO | Admitting: Internal Medicine

## 2015-03-06 ENCOUNTER — Encounter: Payer: Self-pay | Admitting: Internal Medicine

## 2015-03-06 VITALS — BP 130/90 | HR 110 | Temp 98.6°F | Ht 63.0 in | Wt 167.1 lb

## 2015-03-06 DIAGNOSIS — J329 Chronic sinusitis, unspecified: Secondary | ICD-10-CM | POA: Diagnosis not present

## 2015-03-06 DIAGNOSIS — F329 Major depressive disorder, single episode, unspecified: Secondary | ICD-10-CM

## 2015-03-06 DIAGNOSIS — F32A Depression, unspecified: Secondary | ICD-10-CM

## 2015-03-06 DIAGNOSIS — G35 Multiple sclerosis: Secondary | ICD-10-CM | POA: Diagnosis not present

## 2015-03-06 NOTE — Telephone Encounter (Signed)
Pt has appt today with Dr. Nicki Reaper @ 2:30.

## 2015-03-06 NOTE — Telephone Encounter (Signed)
Please advise 

## 2015-03-06 NOTE — Patient Instructions (Signed)
Saline nasal spray- flush nose at least 2-3x/day  flonase nasal spray (or nasacort nasal spray) - two sprays each nostril one time per day.  Do this in the evening.    Mucinex in the am and robitussin in the evening.

## 2015-03-06 NOTE — Telephone Encounter (Signed)
Nurse Assessment Nurse: Donalynn Furlong, RN, Myna Hidalgo Date/Time Eilene Ghazi Time): 03/06/2015 9:39:14 AM Confirm and document reason for call. If symptomatic, describe symptoms. ---caller has had a sinus infection - is worse today - has nasal congestion (has had it for 2 weeks) 98.1 orally. Pt has facial pain, and is very pain Has the patient traveled out of the country within the last 30 days? ---No Does the patient require triage? ---Yes Related visit to physician within the last 2 weeks? ---No Does the PT have any chronic conditions? (i.e. diabetes, asthma, etc.) ---Yes List chronic conditions. ---MS, depression Guidelines Guideline Title Affirmed Question Affirmed Notes Nurse Date/Time Eilene Ghazi Time) Sinus Pain or Congestion [1] SEVERE pain AND [2] not improved 2 hours after pain medicine Donalynn Furlong, RN, Myna Hidalgo 03/06/2015 9:38:54 AM Disp. Time Eilene Ghazi Time) Disposition Final User 03/06/2015 9:45:07 AM See Physician within 4 Hours (or PCP triage) Yes Donalynn Furlong, RN, Myna Hidalgo Caller Understands: Yes Disagree/Comply: Comply

## 2015-03-06 NOTE — Progress Notes (Signed)
Pre visit review using our clinic review tool, if applicable. No additional management support is needed unless otherwise documented below in the visit note. 

## 2015-03-12 ENCOUNTER — Encounter: Payer: Self-pay | Admitting: Internal Medicine

## 2015-03-12 DIAGNOSIS — J329 Chronic sinusitis, unspecified: Secondary | ICD-10-CM | POA: Insufficient documentation

## 2015-03-12 NOTE — Progress Notes (Signed)
Patient ID: Carly [name], female   DOB: [birth year], 64 y.o.   MRN: [id number]   Subjective:    Patient ID: Carly [name], female    DOB: 12-02-1950, 64 y.o.   MRN: [id number]  HPI  Patient here as an acute visit with concerns regarding increased sinus congestion.  States symptoms started three weeks ago.  Last week had body aches.  Increased sinus pressure and increased drainage.  Tmax previously 99.  Feels similar to previous sinus infections.  Is some better.  Dizziness is better.  Has been taking sudafed, claritin and benadryl.  Was also questioning if her cymbalta need to be adjusted. We discussed the need to talk with Dr Nicolasa Ducking about changing the dose.  Feels her MS may be a little worse.  Sees Dr Gorden Harms.     Past Medical History  Diagnosis Date  . Depression   . History of chicken pox   . Frequent headaches     H/O  . Allergy   . Hx of migraines   . History of colon polyps   . Multiple sclerosis   . Diabetes mellitus without complication     Current Outpatient Prescriptions on File Prior to Visit  Medication Sig Dispense Refill  . acetaminophen (TYLENOL) 325 MG tablet Take 650 mg by mouth every 4 (four) hours as needed.    . dalfampridine 10 MG TB12 Take 10 mg by mouth every 12 (twelve) hours.    . Diphenhyd-Hydrocort-Nystatin (FIRST-DUKES MOUTHWASH) SUSP Use as directed 1 Dose in the mouth or throat every 6 (six) hours.    . progesterone (PROMETRIUM) 100 MG capsule Take 100 mg by mouth daily.    . pseudoephedrine (SUDAFED) 30 MG tablet Take 30 mg by mouth every 6 (six) hours as needed for congestion.    . sertraline (ZOLOFT) 100 MG tablet Take 200 mg by mouth daily.    . valACYclovir (VALTREX) 500 MG tablet Take 500 mg by mouth 2 (two) times daily.     No current facility-administered medications on file prior to visit.    Review of Systems  Constitutional: Negative for appetite change and unexpected weight change.  HENT: Positive for congestion, postnasal  drip and sinus pressure.   Respiratory: Negative for cough, chest tightness and shortness of breath.   Cardiovascular: Negative for chest pain, palpitations and leg swelling.  Gastrointestinal: Negative for nausea, vomiting, abdominal pain and diarrhea.  Skin: Negative for color change and rash.  Neurological: Negative for headaches.       Light headedness better.   Hematological: Negative for adenopathy. Does not bruise/bleed easily.       Objective:     Pulse 92  Physical Exam  Constitutional: No distress.  HENT:  Mouth/Throat: Oropharynx is clear and moist.  Nares - slightly erythematous turbinates.   Neck: Neck supple. No thyromegaly present.  Cardiovascular: Normal rate and regular rhythm.   Pulmonary/Chest: Breath sounds normal. No respiratory distress. She has no wheezes.  Abdominal: Soft. There is no tenderness.  Musculoskeletal: She exhibits no edema or tenderness.  Lymphadenopathy:    She has no cervical adenopathy.  Skin: No rash noted. No erythema.    BP 130/90 mmHg  Pulse 110  Temp(Src) 98.6 F (37 C) (Oral)  Ht 5\' 3"  (1.6 m)  Wt 167 lb 2 oz (75.807 kg)  BMI 29.61 kg/m2  SpO2 96% Wt Readings from Last 3 Encounters:  03/06/15 167 lb 2 oz (75.807 kg)  11/14/14 165 lb (74.844 kg)  Lab Results  Component Value Date   WBC 7.6 09/18/2014   HGB 13.6 09/18/2014   HCT 41.1 09/18/2014   PLT 264 09/18/2014   GLUCOSE 88 09/18/2014   CHOL 274* 03/25/2014   TRIG 122 03/25/2014   HDL 75* 03/25/2014   LDLCALC 175 03/25/2014   ALT 42 09/18/2014   AST 23 09/18/2014   NA 138 09/18/2014   K 4.0 09/18/2014   CL 101 09/18/2014   CREATININE 0.83 09/18/2014   BUN 9 09/18/2014   CO2 27 09/18/2014   TSH 1.97 03/25/2014   HGBA1C 5.2 03/25/2014       Assessment & Plan:   Problem List Items Addressed This Visit    Depression    Was questioning changing dose of cymbalta.  Also on zoloft.  Informed to discuss with Dr Nicolasa Ducking.        Relevant Medications    DULoxetine (CYMBALTA) 30 MG capsule   Multiple sclerosis - Primary    Sees Dr Gorden Harms.  Encouraged f/u.       Sinusitis    Appears to be some better.  Still with congestion as outlined.  Treat with mucinex/robitussin as outlined.  Saline nasal spray and nasacort nasal spray as outlined.  Follow.  Do not feel abx are warranted at this time.  Follow.            Einar Pheasant, MD

## 2015-03-12 NOTE — Assessment & Plan Note (Signed)
Appears to be some better.  Still with congestion as outlined.  Treat with mucinex/robitussin as outlined.  Saline nasal spray and nasacort nasal spray as outlined.  Follow.  Do not feel abx are warranted at this time.  Follow.

## 2015-03-12 NOTE — Assessment & Plan Note (Signed)
Sees Dr Gorden Harms.  Encouraged f/u.

## 2015-03-12 NOTE — Assessment & Plan Note (Signed)
Was questioning changing dose of cymbalta.  Also on zoloft.  Informed to discuss with Dr Nicolasa Ducking.

## 2015-03-14 ENCOUNTER — Ambulatory Visit: Payer: BC Managed Care – PPO | Admitting: Hematology and Oncology

## 2015-03-14 ENCOUNTER — Ambulatory Visit: Payer: BC Managed Care – PPO

## 2015-03-15 ENCOUNTER — Inpatient Hospital Stay: Payer: BC Managed Care – PPO | Attending: Hematology and Oncology

## 2015-03-15 VITALS — BP 151/72 | HR 89 | Temp 97.0°F | Resp 20

## 2015-03-15 DIAGNOSIS — G35 Multiple sclerosis: Secondary | ICD-10-CM | POA: Diagnosis present

## 2015-03-15 DIAGNOSIS — E119 Type 2 diabetes mellitus without complications: Secondary | ICD-10-CM | POA: Diagnosis not present

## 2015-03-15 DIAGNOSIS — Z5111 Encounter for antineoplastic chemotherapy: Secondary | ICD-10-CM | POA: Diagnosis not present

## 2015-03-15 DIAGNOSIS — F329 Major depressive disorder, single episode, unspecified: Secondary | ICD-10-CM | POA: Insufficient documentation

## 2015-03-15 DIAGNOSIS — Z86010 Personal history of colonic polyps: Secondary | ICD-10-CM | POA: Insufficient documentation

## 2015-03-15 DIAGNOSIS — Z79899 Other long term (current) drug therapy: Secondary | ICD-10-CM | POA: Insufficient documentation

## 2015-03-15 MED ORDER — NATALIZUMAB 300 MG/15ML IV CONC
300.0000 mg | Freq: Once | INTRAVENOUS | Status: AC
  Administered 2015-03-15: 300 mg via INTRAVENOUS
  Filled 2015-03-15: qty 15

## 2015-03-15 MED ORDER — ACETAMINOPHEN 500 MG PO TABS
1000.0000 mg | ORAL_TABLET | Freq: Once | ORAL | Status: DC
  Filled 2015-03-15: qty 2

## 2015-03-15 MED ORDER — SODIUM CHLORIDE 0.9 % IV SOLN
Freq: Once | INTRAVENOUS | Status: AC
  Administered 2015-03-15: 11:00:00 via INTRAVENOUS
  Filled 2015-03-15: qty 1000

## 2015-03-15 NOTE — Progress Notes (Signed)
Pt here for Tysabri today. Pt having severe pain/ inflammation in L leg. 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 Taking Tylenol prn. Cancelled MD appt here yest d/t severity of leg pain.

## 2015-03-22 ENCOUNTER — Ambulatory Visit: Payer: BC Managed Care – PPO | Admitting: Internal Medicine

## 2015-04-09 ENCOUNTER — Other Ambulatory Visit: Payer: BC Managed Care – PPO

## 2015-04-11 ENCOUNTER — Inpatient Hospital Stay: Payer: BC Managed Care – PPO | Attending: Hematology and Oncology

## 2015-04-11 ENCOUNTER — Ambulatory Visit: Payer: BC Managed Care – PPO | Admitting: Hematology and Oncology

## 2015-04-11 ENCOUNTER — Inpatient Hospital Stay: Payer: BC Managed Care – PPO

## 2015-04-11 ENCOUNTER — Inpatient Hospital Stay (HOSPITAL_BASED_OUTPATIENT_CLINIC_OR_DEPARTMENT_OTHER): Payer: BC Managed Care – PPO | Admitting: Hematology and Oncology

## 2015-04-11 ENCOUNTER — Ambulatory Visit: Payer: BC Managed Care – PPO | Admitting: Internal Medicine

## 2015-04-11 ENCOUNTER — Other Ambulatory Visit: Payer: Self-pay | Admitting: *Deleted

## 2015-04-11 VITALS — BP 107/62 | HR 80

## 2015-04-11 VITALS — BP 139/79 | HR 100 | Temp 97.0°F | Wt 164.0 lb

## 2015-04-11 DIAGNOSIS — Z79899 Other long term (current) drug therapy: Secondary | ICD-10-CM | POA: Diagnosis not present

## 2015-04-11 DIAGNOSIS — G35 Multiple sclerosis: Secondary | ICD-10-CM

## 2015-04-11 MED ORDER — ACETAMINOPHEN 500 MG PO TABS
1000.0000 mg | ORAL_TABLET | Freq: Once | ORAL | Status: AC
  Administered 2015-04-11: 1000 mg via ORAL
  Filled 2015-04-11: qty 2

## 2015-04-11 MED ORDER — SODIUM CHLORIDE 0.9 % IV SOLN
300.0000 mg | Freq: Once | INTRAVENOUS | Status: AC
  Administered 2015-04-11: 300 mg via INTRAVENOUS
  Filled 2015-04-11: qty 15

## 2015-04-11 MED ORDER — SODIUM CHLORIDE 0.9 % IV SOLN
Freq: Once | INTRAVENOUS | Status: AC
  Administered 2015-04-11: 11:00:00 via INTRAVENOUS
  Filled 2015-04-11: qty 1000

## 2015-04-11 NOTE — Progress Notes (Signed)
Pt is here for her next tysabri treatment. Using a cane in clinic today. Pt states she usually doesn't need one but carries it in case. Has some mild foot swelling that occurs in the heat and from previous foot surgeries. Her MS affects her L leg. Has L leg weakness. She also has heat intolerance.

## 2015-04-11 NOTE — Progress Notes (Signed)
Carly [name] Clinic day:  04/11/2015  Chief Complaint: Carly [name] is a 64 y.o. female with multiple sclerosis who is seen for reassessment prior to continuation of Tysabri (natalizumab).  HPI:  The patient states that she was diagnosed with multiple sclerosis at the age of 55. She describes getting hot at the beach and couldn't function. She was initially diagnosed with a sciatic nerve issue. She was limping on the left side. She denied any numbness or tingling.  She has been followed by Dr. Gorden Harms in neurology. She sees him every 3 months. She notes that she is JC virus negative. She receives her Tysabri infusions every month.  She denies any problems with her infusions.  Recently she has had foot and back surgery. She fell on her right shoulder and shipped a bone. She has had no infections. She denies any urinary symptoms. She has some constipation.  Past Medical History  Diagnosis Date  . Depression   . History of chicken pox   . Frequent headaches     H/O  . Allergy   . Hx of migraines   . History of colon polyps   . Multiple sclerosis   . Diabetes mellitus without complication     Past Surgical History  Procedure Laterality Date  . Gallbladder surgery  2008  . Spine surgery  2014  . Foot surgery  2015    Family History  Problem Relation Age of Onset  . Arthritis Mother   . Hypertension Mother   . Hypertension Father   . Hyperlipidemia Father   . Heart disease Maternal Grandfather   . Diabetes Maternal Grandfather   . Kidney disease Paternal Grandmother     Social History:  reports that she has never smoked. She has never used smokeless tobacco. She reports that she does not drink alcohol or use illicit drugs.  The patient is alone today.  Allergies:  Allergies  Allergen Reactions  . Asa [Aspirin]   . Buspirone Other (See Comments)  . Levofloxacin Other (See Comments)    "weakness"  . Motrin [Ibuprofen] Swelling   . Penicillins   . Sulfa Antibiotics   . Ceftin [Cefuroxime Axetil] Rash    Current Medications: Current Outpatient Prescriptions  Medication Sig Dispense Refill  . acetaminophen (TYLENOL) 325 MG tablet Take 650 mg by mouth every 4 (four) hours as needed.    . dalfampridine 10 MG TB12 Take 10 mg by mouth every 12 (twelve) hours.    . DULoxetine (CYMBALTA) 30 MG capsule Take 60 mg by mouth daily.  0  . progesterone (PROMETRIUM) 100 MG capsule Take 100 mg by mouth daily.    . pseudoephedrine (SUDAFED) 30 MG tablet Take 30 mg by mouth every 6 (six) hours as needed for congestion.    . sertraline (ZOLOFT) 100 MG tablet Take 200 mg by mouth daily.    . Diphenhyd-Hydrocort-Nystatin (FIRST-DUKES MOUTHWASH) SUSP Use as directed 1 Dose in the mouth or throat every 6 (six) hours.    . valACYclovir (VALTREX) 500 MG tablet Take 500 mg by mouth 2 (two) times daily.     No current facility-administered medications for this visit.    Review of Systems:  GENERAL:  Feels ok.  No fevers, sweats or weight loss. PERFORMANCE STATUS (ECOG):  1-2 HEENT:  No visual changes, runny nose, sore throat, mouth sores or tenderness. Lungs: No shortness of breath or cough.  No hemoptysis. Cardiac:  No chest pain, palpitations, orthopnea, or PND.  Breasts:  Dense.  Denies any lumps or skin changes.  Notes last mammogram 09/2014. GI:  Constipation.  No nausea, vomiting, diarrhea, melena or hematochezia.  Polyps.  Colonoscopy due in 1 year. GU:  No urgency, frequency, dysuria, or hematuria. Musculoskeletal:  No back pain.  No joint pain.  No muscle tenderness. Extremities:  No pain or swelling. Skin:  No rashes or skin changes. Neuro:  No headache, numbness or weakness, balance or coordination issues. Endocrine:  No diabetes, thyroid issues, hot flashes or night sweats. Psych:  No mood changes, depression or anxiety. Pain:  No focal pain. Review of systems:  All other systems reviewed and found to be negative.    Physical Exam: Blood pressure 139/79, pulse 100, temperature 97 F (36.1 C), temperature source Tympanic, weight 164 lb (74.39 kg), SpO2 94 %. GENERAL:  Well developed, well nourished, sitting comfortably in the exam room in no acute distress.  She has a cane at her side. MENTAL STATUS:  Alert and oriented to person, place and time. HEAD:  Short brown hair.  Normocephalic, atraumatic, face symmetric, no Cushingoid features. EYES:  Brown eyes.  Pupils equal round and reactive to light and accomodation.  No conjunctivitis or scleral icterus. ENT:  Oropharynx clear without lesion.  Patchy tongue. Mucous membranes moist.  RESPIRATORY:  Clear to auscultation without rales, wheezes or rhonchi. CARDIOVASCULAR:  Regular rate and rhythm without murmur, rub or gallop. ABDOMEN:  Soft, non-tender, with active bowel sounds, and no hepatosplenomegaly.  No masses. SKIN:  No rashes, ulcers or lesions. EXTREMITIES: Trace bilateral ankle edema.  No skin discoloration or tenderness.  No palpable cords. LYMPH NODES: No palpable cervical, supraclavicular, axillary or inguinal adenopathy  NEUROLOGICAL: Alert & oriented, cranial nerves II-XII intact; motor strength 5/5 throughout except left leg; sensation intact; finger to nose shaky; 2++ bilateral patellar reflexes. PSYCH:  Appropriate.  No visits with results within 3 Day(s) from this visit. Latest known visit with results is:  Abstract on 01/25/2015  Component Date Value Ref Range Status  . Creatine, Serum 09/18/2014 0.83   Final    Assessment:  Carly [name] is a 64 y.o. female with multiple sclerosis for 4 years.  She receives Tysabri monthly.  She is tolerating her treatment well.  Per patient report, she is doing well.  Plan: 1.  Review entire medical history, diagnosis and treatment of multiple sclerosis. 2.  Tysabri today. 3.  RTC monthly for Tysabri. 4.  Follow-up every 3 months with Dr. Gorden Harms. 5.  RTC for MD assessment and Tysabri  every 6 months.   Lequita Asal, MD  04/11/2015, 11:17 PM

## 2015-05-09 ENCOUNTER — Inpatient Hospital Stay: Payer: BC Managed Care – PPO | Attending: Hematology and Oncology

## 2015-05-09 VITALS — BP 119/68 | HR 86 | Temp 97.7°F | Resp 20

## 2015-05-09 DIAGNOSIS — G35 Multiple sclerosis: Secondary | ICD-10-CM | POA: Insufficient documentation

## 2015-05-09 DIAGNOSIS — Z79899 Other long term (current) drug therapy: Secondary | ICD-10-CM | POA: Insufficient documentation

## 2015-05-09 MED ORDER — ACETAMINOPHEN 500 MG PO TABS
1000.0000 mg | ORAL_TABLET | Freq: Once | ORAL | Status: AC
  Administered 2015-05-09: 1000 mg via ORAL
  Filled 2015-05-09: qty 2

## 2015-05-09 MED ORDER — SODIUM CHLORIDE 0.9 % IV SOLN
Freq: Once | INTRAVENOUS | Status: AC
  Administered 2015-05-09: 11:00:00 via INTRAVENOUS
  Filled 2015-05-09: qty 1000

## 2015-05-09 MED ORDER — SODIUM CHLORIDE 0.9 % IV SOLN
300.0000 mg | Freq: Once | INTRAVENOUS | Status: AC
  Administered 2015-05-09: 300 mg via INTRAVENOUS
  Filled 2015-05-09: qty 15

## 2015-06-06 ENCOUNTER — Inpatient Hospital Stay: Payer: BC Managed Care – PPO

## 2015-06-11 ENCOUNTER — Inpatient Hospital Stay: Payer: BC Managed Care – PPO | Attending: Hematology and Oncology

## 2015-06-11 VITALS — BP 132/69 | HR 86 | Temp 97.0°F | Resp 20

## 2015-06-11 DIAGNOSIS — Z79899 Other long term (current) drug therapy: Secondary | ICD-10-CM | POA: Diagnosis not present

## 2015-06-11 DIAGNOSIS — G35 Multiple sclerosis: Secondary | ICD-10-CM | POA: Diagnosis present

## 2015-06-11 MED ORDER — NATALIZUMAB 300 MG/15ML IV CONC
300.0000 mg | Freq: Once | INTRAVENOUS | Status: AC
  Administered 2015-06-11: 300 mg via INTRAVENOUS
  Filled 2015-06-11: qty 15

## 2015-06-11 MED ORDER — SODIUM CHLORIDE 0.9 % IV SOLN
Freq: Once | INTRAVENOUS | Status: AC
  Administered 2015-06-11: 11:00:00 via INTRAVENOUS
  Filled 2015-06-11: qty 1000

## 2015-06-22 LAB — LIPID PANEL
Cholesterol: 217 mg/dL — AB (ref 0–200)
HDL: 97 mg/dL — AB (ref 35–70)
LDL CALC: 96 mg/dL
Triglycerides: 120 mg/dL (ref 40–160)

## 2015-06-22 LAB — HEMOGLOBIN A1C: Hgb A1c MFr Bld: 5.7 % (ref 4.0–6.0)

## 2015-07-04 ENCOUNTER — Inpatient Hospital Stay: Payer: BC Managed Care – PPO

## 2015-07-06 ENCOUNTER — Encounter: Payer: Self-pay | Admitting: Internal Medicine

## 2015-07-09 ENCOUNTER — Encounter: Payer: Self-pay | Admitting: Hematology and Oncology

## 2015-07-09 ENCOUNTER — Ambulatory Visit: Payer: BC Managed Care – PPO

## 2015-07-10 ENCOUNTER — Other Ambulatory Visit: Payer: BC Managed Care – PPO

## 2015-07-11 ENCOUNTER — Inpatient Hospital Stay: Payer: BC Managed Care – PPO | Attending: Hematology and Oncology

## 2015-07-11 VITALS — BP 141/65 | HR 85 | Temp 97.0°F | Resp 20

## 2015-07-11 DIAGNOSIS — G35 Multiple sclerosis: Secondary | ICD-10-CM | POA: Diagnosis not present

## 2015-07-11 DIAGNOSIS — Z79899 Other long term (current) drug therapy: Secondary | ICD-10-CM | POA: Diagnosis not present

## 2015-07-11 MED ORDER — ACETAMINOPHEN 500 MG PO TABS: 1000.0000 mg | ORAL_TABLET | Freq: Once | ORAL | Status: DC

## 2015-07-11 MED ORDER — SODIUM CHLORIDE 0.9 % IV SOLN
300.0000 mg | Freq: Once | INTRAVENOUS | Status: AC
  Administered 2015-07-11: 300 mg via INTRAVENOUS
  Filled 2015-07-11: qty 15

## 2015-07-11 MED ORDER — SODIUM CHLORIDE 0.9 % IV SOLN
Freq: Once | INTRAVENOUS | Status: AC
  Administered 2015-07-11: 10:00:00 via INTRAVENOUS
  Filled 2015-07-11: qty 1000

## 2015-07-12 ENCOUNTER — Ambulatory Visit: Payer: BC Managed Care – PPO | Admitting: Internal Medicine

## 2015-07-17 ENCOUNTER — Ambulatory Visit: Payer: BC Managed Care – PPO | Admitting: Physical Therapy

## 2015-07-23 ENCOUNTER — Other Ambulatory Visit: Payer: BC Managed Care – PPO

## 2015-07-24 ENCOUNTER — Ambulatory Visit: Payer: BC Managed Care – PPO | Admitting: Physical Therapy

## 2015-07-24 ENCOUNTER — Other Ambulatory Visit: Payer: BC Managed Care – PPO

## 2015-07-26 ENCOUNTER — Ambulatory Visit: Payer: BC Managed Care – PPO | Admitting: Physical Therapy

## 2015-07-30 ENCOUNTER — Ambulatory Visit: Payer: BC Managed Care – PPO | Admitting: Physical Therapy

## 2015-08-01 ENCOUNTER — Inpatient Hospital Stay: Payer: Medicare Other

## 2015-08-01 ENCOUNTER — Other Ambulatory Visit: Payer: Medicare Other

## 2015-08-01 ENCOUNTER — Telehealth: Payer: Self-pay | Admitting: Internal Medicine

## 2015-08-01 ENCOUNTER — Ambulatory Visit: Payer: Medicare Other | Admitting: Physical Therapy

## 2015-08-01 NOTE — Telephone Encounter (Signed)
She just had cholesterol and metabolic panel checked in 06/2015.  Too early for labs.  Let me know if there was something specific she was needing.

## 2015-08-01 NOTE — Telephone Encounter (Signed)
Pt would like to get her labs done at [facility] on [place]. Pt would like the lab form to be sent to that location. Thank You!

## 2015-08-06 ENCOUNTER — Ambulatory Visit: Payer: BC Managed Care – PPO

## 2015-08-07 ENCOUNTER — Ambulatory Visit: Payer: BC Managed Care – PPO | Admitting: Physical Therapy

## 2015-08-08 ENCOUNTER — Ambulatory Visit: Payer: Medicare Other

## 2015-08-09 ENCOUNTER — Ambulatory Visit: Payer: BC Managed Care – PPO | Admitting: Physical Therapy

## 2015-08-09 ENCOUNTER — Inpatient Hospital Stay: Payer: Medicare Other | Attending: Hematology and Oncology

## 2015-08-09 VITALS — BP 123/73 | HR 87 | Temp 98.1°F | Resp 18

## 2015-08-09 DIAGNOSIS — Z79899 Other long term (current) drug therapy: Secondary | ICD-10-CM | POA: Insufficient documentation

## 2015-08-09 DIAGNOSIS — G35 Multiple sclerosis: Secondary | ICD-10-CM | POA: Insufficient documentation

## 2015-08-09 MED ORDER — ACETAMINOPHEN 500 MG PO TABS
1000.0000 mg | ORAL_TABLET | Freq: Once | ORAL | Status: AC
  Administered 2015-08-09: 1000 mg via ORAL
  Filled 2015-08-09: qty 2

## 2015-08-09 MED ORDER — SODIUM CHLORIDE 0.9 % IV SOLN
Freq: Once | INTRAVENOUS | Status: AC
  Administered 2015-08-09: 10:00:00 via INTRAVENOUS
  Filled 2015-08-09: qty 1000

## 2015-08-09 MED ORDER — SODIUM CHLORIDE 0.9 % IV SOLN
300.0000 mg | Freq: Once | INTRAVENOUS | Status: AC
  Administered 2015-08-09: 300 mg via INTRAVENOUS
  Filled 2015-08-09: qty 15

## 2015-08-22 ENCOUNTER — Ambulatory Visit: Payer: Medicare Other | Attending: Neurology

## 2015-08-22 DIAGNOSIS — R2681 Unsteadiness on feet: Secondary | ICD-10-CM | POA: Insufficient documentation

## 2015-08-22 DIAGNOSIS — R531 Weakness: Secondary | ICD-10-CM

## 2015-08-23 NOTE — Patient Instructions (Signed)
[email or web address] Heel raises 2x10 Standing hip abduction 2x10

## 2015-08-23 NOTE — Therapy (Signed)
[place] MAIN [facility] SERVICES [address] [place], [place], [address] Phone: [phone]   Fax:  [phone]  Physical Therapy Evaluation  Patient Details  Name: Carly [name] MRN: [id number] Date of Birth: [birth year] Referring Provider: Dr. Manuella Ghazi  Encounter Date: 08/22/2015      PT End of Session - 08/23/15 1125    Visit Number 1   Number of Visits 9   Date for PT Re-Evaluation 09/19/15   Authorization Type 1/10 g codes   PT Start Time 1015   PT Stop Time 1115   PT Time Calculation (min) 60 min   Equipment Utilized During Treatment Gait belt   Activity Tolerance Patient tolerated treatment well   Behavior During Therapy Anxious      Past Medical History  Diagnosis Date  . Depression   . History of chicken pox   . Frequent headaches     H/O  . Allergy   . Hx of migraines   . History of colon polyps   . Multiple sclerosis ([place])   . Diabetes mellitus without complication [facility])     Past Surgical History  Procedure Laterality Date  . Gallbladder surgery  2008  . Spine surgery  2014  . Foot surgery  2015    There were no vitals filed for this visit.  Visit Diagnosis:  Unsteadiness on feet - Plan: PT plan of care cert/re-cert  Weakness generalized - Plan: PT plan of care cert/re-cert      Subjective Assessment - 08/23/15 1117    Subjective Pt presents to PT with chief complaint of fear of falling.  Pt relates she was independent and ambulating without an assistive after having back and foot surgery until January 1st of this year when she fell and fractured her right shoulder.  Since then, she has had severe fear of falling.  She received PT in April for 3 months but was not able to discontinue use of rolling walker due to fear of falling. She transitioned to using cane in June.  Pt had to start using her walker last week after feeling weak and unsteady from acquiring a UTI.  Pt currently feels weaker and unsteady  compared to what she felt a few weeks ago.  Pt notes she feels better with exercise and uses stationary bike at home and pilates machine and goes to yoga.  Pt feels when she doesn't exercise and has not gone to yoga in the past two weeks due to feeling ill from UTI.  Pt currently denies any pain but notes that her left LE is the limb affect by MS.     Patient Stated Goals Pt would like to improve her confidence and be able to walk again without her AD.     Currently in Pain? No/denies   Pain Score 0-No pain            [facility] PT Assessment - 08/23/15 1120    Assessment   Medical Diagnosis Imbalance   Referring Provider Dr. Manuella Ghazi   Onset Date/Surgical Date 10/13/14   Hand Dominance Right   Next MD Visit 09/28/15   Prior Therapy yes in April for 3 months   Precautions   Precautions Fall   Restrictions   Weight Bearing Restrictions No   Balance Screen   Has the patient fallen in the past 6 months No   Has the patient had a decrease in activity level because of a fear of falling?  Yes   Is  the patient reluctant to leave their home because of a fear of falling?  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 for tasks assessed   Sensation   Light Touch Appears Intact   Coordination   Gross Motor Movements are Fluid and Coordinated Yes       PAIN: 0/10  POSTURE: Slumped sitting with forward head posture   AROM: Bilateral LE WNL STRENGTH:  Graded on a 0-5 scale Muscle Group Left Right  Elbow    Wrist/hand    Hip Flex 4- 4-  Hip  Abd 2+ 2+  Hip Add    Hip Ext (modified position with bridge) 3 3  Hip IR/ER    Knee Flex 4+ 5  Knee Ext 5 5  Ankle DF 4 5  Ankle PF 2 2   SENSATION: Bilateral LEs WNL to light touch   Reflex: Knee jerk: Right: 2+ Left: 3+ Ankle jerk:  Right: +1  Left: absent    FUNCTIONAL MOBILITY: Pt hesitant to perform transfers and reaches for assistance due to fear of falling Pt requires extra time to perform bed mobility   BALANCE: Pt is unsteady throughout session and requires CGA for safety   GAIT: Pt ambulates with single point cane on R and with short stride length with minimal foot clearance bilaterally, pt has slow cadence, minimal arm swing and is anxious with gait.   OUTCOME MEASURES: TEST Outcome Interpretation  5 times sit<>stand Unable to complete due  >30 yo, >15 sec indicates increased risk for falls  10 meter walk test               0.55  m/s <1.0 m/s indicates increased risk for falls; limited community ambulator  Timed up and Go          36       sec <14 sec indicates increased risk for falls  6 minute walk test                Feet 1000 feet is community Water quality scientist  <36/56 (100% risk for falls), 37-45 (80% risk for falls); 46-51 (>50% risk for falls); 52-55 (lower risk <25% of falls)  ABC scale 41.25%-100%    There ex: Heel raises 2x10 in //bars Hip abduction 2x10 in //bars Educated pt how decreased physical activity leads to weakness and how that contributes to increase risk of falls                       PT Education - 08/23/15 1125    Education provided Yes   Education Details plan of care and HEP   Person(s) Educated Patient   Methods Explanation   Comprehension Verbalized understanding             PT Long Term Goals - 08/23/15 1136    PT LONG TERM GOAL #1   Title pt's TUG time will improve by 15 seconds with LRAD  indicating decreased risk of falls  Baseline 36 seconds on 11/09 with single point cane    Time 4   Period Weeks   Status New   PT LONG TERM GOAL #2   Title pt's gait speed will be at least 0.8 m/s with LRAD for full community ambulation   Baseline 0.55 m/s with single point cane on eval (11/09)   Time 4   Period Weeks   Status New   PT LONG TERM GOAL #3   Title pt's ABC scale score will improve by at least 10 points indicating improved confidence with functional movements    Baseline 41.25% on eval (11/09)   Time 4   Period Weeks   Status New               Plan - 09/10/2015 1126    Clinical Impression Statement Pt is a pleasant 64 year old female who presents with fear of falling, unsteadiness and LE weakness.  Pt has fallen once in the past 10 months where she fractured her right shoulder and since then has had fear of falling.  Pt is unsteady throughout exam and her performance today indicates she is at risk of falls.  Pt's confidence and performance improves in the //bars versus in the gym.   pt demonstrates impaired LE strength, impaired gait, and balance . pt is motivated to reduce fall risk and become stronger. pt is of high fall risk at this time and would benefit from skilled PT services to improve upon impairments and reduce fall risk to maximize functional mobility.     Pt will benefit from skilled therapeutic intervention in order to improve on the following deficits Abnormal gait;Difficulty walking;Postural dysfunction;Decreased strength;Decreased mobility;Decreased balance   Rehab Potential Fair   PT Frequency 2x / week   PT Duration 4 weeks   PT Treatment/Interventions Aquatic Therapy;Therapeutic exercise;Therapeutic activities;Functional mobility training;Stair training;Gait training;Balance training;Neuromuscular re-education;Manual techniques;Vestibular   PT Next Visit Plan progress HEP          G-Codes - 09-10-15 1154    Functional Assessment Tool Used outcome measures, clinical judgment, history    Functional Limitation Mobility: Walking and moving  around   Mobility: Walking and Moving Around Current Status [phone]) At least 60 percent but less than 80 percent impaired, limited or restricted   Mobility: Walking and Moving Around Goal Status [phone]) At least 40 percent but less than 60 percent impaired, limited or restricted       Problem List Patient Active Problem List   Diagnosis Date Noted  . Sinusitis 03/12/2015  . Colon polyp 01/25/2015  . Bone/cartilage disorder 01/25/2015  . Headache 11/18/2014  . Depression 11/18/2014  . Greater tuberosity of humerus fracture 11/18/2014  . Multiple sclerosis ([place]) 11/18/2014  . Unsteady gait 11/18/2014  . Dizziness 11/18/2014  . Inconclusive mammogram 03/15/2013  . Back ache 09/06/2012  . BP (high blood pressure) 09/06/2012  . HLD (hyperlipidemia) 01/28/2012  . Climacteric 01/28/2012  . Routine general medical examination at a health care facility 01/28/2012  . Avitaminosis D 01/28/2012   Renford Dills, SPT This entire session was performed under direct supervision and direction of a licensed therapist/therapist assistant . I have personally read, edited and approve of the note as written. Gorden Harms. Tortorici, PT, DPT [phone]  Tortorici,Ashley 2015/09/10, 4:42 PM  [place] MAIN [facility] SERVICES [address] [place], [place], [address] Phone: [phone]   Fax:  [phone]  Name: Carly [name] MRN: [id number] Date of Birth: [birth year]

## 2015-08-29 ENCOUNTER — Inpatient Hospital Stay: Payer: BC Managed Care – PPO

## 2015-08-29 ENCOUNTER — Ambulatory Visit: Payer: Medicare Other

## 2015-08-29 DIAGNOSIS — R2681 Unsteadiness on feet: Secondary | ICD-10-CM | POA: Diagnosis not present

## 2015-08-29 DIAGNOSIS — R531 Weakness: Secondary | ICD-10-CM

## 2015-08-30 NOTE — Therapy (Signed)
[place] MAIN [facility] SERVICES [address] [place], [place], [address] Phone: [phone]   Fax:  [phone]  Physical Therapy Treatment  Patient Details  Name: Carly [name] MRN: [id number] Date of Birth: [birth year] Referring Provider: Dr. Manuella Ghazi  Encounter Date: 08/29/2015      PT End of Session - 08/30/15 1319    Visit Number 2   Number of Visits 9   Date for PT Re-Evaluation 09/19/15   Authorization Type 2/10 g codes   PT Start Time 1500   PT Stop Time 1545   PT Time Calculation (min) 45 min   Equipment Utilized During Treatment Gait belt   Activity Tolerance Patient tolerated treatment well   Behavior During Therapy Anxious      Past Medical History  Diagnosis Date  . Depression   . History of chicken pox   . Frequent headaches     H/O  . Allergy   . Hx of migraines   . History of colon polyps   . Multiple sclerosis ([place])   . Diabetes mellitus without complication [facility])     Past Surgical History  Procedure Laterality Date  . Gallbladder surgery  2008  . Spine surgery  2014  . Foot surgery  2015    There were no vitals filed for this visit.  Visit Diagnosis:  Unsteadiness on feet  Weakness generalized      Subjective Assessment - 08/30/15 1317    Subjective pt relates has been able to ambulate better and furhter after performing her HEP.  She feels unsteady today and thinks it is due to taking medication for headache.  pt is excited to progress her HEP   Patient Stated Goals Pt would like to improve her confidence and be able to walk again without her AD.     Currently in Pain? No/denies   Pain Score 0-No pain      There ex: Standing hip flexion/extension/abduction in 2x10 each LE in //bars Side stepping in //bars x2 laps with +1 UE assist  Marching in //bars x2 laps with +1 UE assist Heel raises 2x10 L TKE with red band 2x10, pt required visual and tactile cues for correct technique Quad sets 2x10  with 3 sec hold SLR 3x5, pt required cues to maintain left knee extended throughout exercise Pt required SBA while performing standing exercises and cues for correct exercise technique Pt ambulated around the gym with single point cane and CGA for safety                           PT Education - 08/30/15 1318    Education provided Yes   Education Details new strength exercises   Person(s) Educated Patient   Methods Explanation   Comprehension Verbalized understanding             PT Long Term Goals - 08/23/15 1136    PT LONG TERM GOAL #1   Title pt's TUG time will improve by 15 seconds with LRAD  indicating decreased risk of falls   Baseline 36 seconds on 11/09 with single point cane   Time 4   Period Weeks   Status New   PT LONG TERM GOAL #2   Title pt's gait speed will be at least 0.8 m/s with LRAD for full community ambulation   Baseline 0.55 m/s with single point cane on eval (11/09)   Time 4   Period Weeks  Status New   PT LONG TERM GOAL #3   Title pt's ABC scale score will improve by at least 10 points indicating improved confidence with functional movements    Baseline 41.25% on eval (11/09)   Time 4   Period Weeks   Status New               Plan - 08/30/15 1319    Clinical Impression Statement Pt did really well today and was able to ambulate around the gym with less anxiety and did not experience an increase in pain with strength progression.  Pt demonstrates decreased left quad strength and, session focused on increasing LE strength, especially on the L. pt demonstrates decreased stance time on left and drags her R occasionally when performing the exercises    Pt will benefit from skilled therapeutic intervention in order to improve on the following deficits Abnormal gait;Difficulty walking;Postural dysfunction;Decreased strength;Decreased mobility;Decreased balance   Rehab Potential Fair   PT Frequency 2x / week   PT Duration 4  weeks   PT Treatment/Interventions Aquatic Therapy;Therapeutic exercise;Therapeutic activities;Functional mobility training;Stair training;Gait training;Balance training;Neuromuscular re-education;Manual techniques;Vestibular   PT Next Visit Plan progress HEP        Problem List Patient Active Problem List   Diagnosis Date Noted  . Sinusitis 03/12/2015  . Colon polyp 01/25/2015  . Bone/cartilage disorder 01/25/2015  . Headache 11/18/2014  . Depression 11/18/2014  . Greater tuberosity of humerus fracture 11/18/2014  . Multiple sclerosis ([place]) 11/18/2014  . Unsteady gait 11/18/2014  . Dizziness 11/18/2014  . Inconclusive mammogram 03/15/2013  . Back ache 09/06/2012  . BP (high blood pressure) 09/06/2012  . HLD (hyperlipidemia) 01/28/2012  . Climacteric 01/28/2012  . Routine general medical examination at a health care facility 01/28/2012  . Avitaminosis D 01/28/2012   Renford Dills, SPT This entire session was performed under direct supervision and direction of a licensed therapist/therapist assistant . I have personally read, edited and approve of the note as written. Gorden Harms. Tortorici, PT, DPT [phone]  Tortorici,Ashley 08/30/2015, 4:01 PM  [place] MAIN [facility] SERVICES [address] [place], [place], [address] Phone: [phone]   Fax:  [phone]  Name: Carly [name] MRN: [id number] Date of Birth: [birth year]

## 2015-08-30 NOTE — Patient Instructions (Signed)
[email or web address]  Quad set 2x10 with 3 sec hold SLR 2x10

## 2015-09-03 ENCOUNTER — Ambulatory Visit: Payer: Medicare Other

## 2015-09-03 ENCOUNTER — Ambulatory Visit: Payer: BC Managed Care – PPO

## 2015-09-03 DIAGNOSIS — R2681 Unsteadiness on feet: Secondary | ICD-10-CM

## 2015-09-03 DIAGNOSIS — R531 Weakness: Secondary | ICD-10-CM

## 2015-09-03 NOTE — Patient Instructions (Signed)
[email or web address] Standing lumbar extension over counter top x 10 / hr Prone on elbows 3 min x 3 daily Posture ed

## 2015-09-03 NOTE — Therapy (Signed)
[place] MAIN [facility] SERVICES [address] [place], [place], [address] Phone: [phone]   Fax:  [phone]  Physical Therapy Treatment  Patient Details  Name: Carly [name] MRN: [id number] Date of Birth: 06-11-1951 Referring Provider: Dr. Manuella Ghazi  Encounter Date: 09/03/2015      PT End of Session - 09/03/15 1741    Visit Number 3   Number of Visits 9   Date for PT Re-Evaluation 09/19/15   Authorization Type 3/10   PT Start Time 1545   PT Stop Time 1630   PT Time Calculation (min) 45 min   Equipment Utilized During Treatment Gait belt   Activity Tolerance Patient tolerated treatment well   Behavior During Therapy Anxious      Past Medical History  Diagnosis Date  . Depression   . History of chicken pox   . Frequent headaches     H/O  . Allergy   . Hx of migraines   . History of colon polyps   . Multiple sclerosis ([place])   . Diabetes mellitus without complication [facility])     Past Surgical History  Procedure Laterality Date  . Gallbladder surgery  2008  . Spine surgery  2014  . Foot surgery  2015    There were no vitals filed for this visit.  Visit Diagnosis:  Unsteadiness on feet  Weakness generalized      Subjective Assessment - 09/03/15 1739    Subjective pt reports she is nearly unable to walk today due to LLE weakness with associated lower back pain. pt reports she has these episodes, she is unsure if this is due to the exercises or her MS.    Patient Stated Goals Pt would like to improve her confidence and be able to walk again without her AD.     Currently in Pain? Yes   Pain Score 4    Pain Location --  lower back and LLE hip       PT exam of lower back: mytomes : impaired L 3-4 on the LLE Dermatomes WNL bilaterally + clonus on RLE initially pt required assist to transfer form WC to table due to LLE weakness "feeling like it will give out" Repeated lumbar flexion peripheralized pain from LBP to  the L knee Prone on elbows and mini PPU x 10 yielded no LLE pain and pt was able to ambulate with out assistance and pt reported 75% improvement in "leg stability" Improved myotome motor loss at recheck following treatment Sitting with lumbar roll during pt education and HEP review x 5 min  Prone lying with ice on lower back x 5 min no charge at end of session                          PT Education - 09/03/15 1740    Education provided Yes   Education Details mechanical therapy with extension preference. PT exam findings. HEP   Person(s) Educated Patient   Methods Explanation   Comprehension Verbalized understanding;Returned demonstration             PT Long Term Goals - 08/23/15 1136    PT LONG TERM GOAL #1   Title pt's TUG time will improve by 15 seconds with LRAD  indicating decreased risk of falls   Baseline 36 seconds on 11/09 with single point cane   Time 4   Period Weeks   Status New   PT LONG TERM GOAL #  2   Title pt's gait speed will be at least 0.8 m/s with LRAD for full community ambulation   Baseline 0.55 m/s with single point cane on eval (11/09)   Time 4   Period Weeks   Status New   PT LONG TERM GOAL #3   Title pt's ABC scale score will improve by at least 10 points indicating improved confidence with functional movements    Baseline 41.25% on eval (11/09)   Time 4   Period Weeks   Status New               Plan - 09/03/15 1741    Clinical Impression Statement pt presented today with dramatic increased LLE weakness found to be along L3-4 myotome dramatically reducing her ability to ambulate. PT exam revealed likely lumbar derangement with extension preference. extension based exercises today yielded a great result and return in ability for pt to ambulate out of clinic using SPC. pt advised to continue extension based exercises at home per instructions, but to stop if peripheral symtpoms/ worsen.   Pt will benefit from skilled  therapeutic intervention in order to improve on the following deficits Abnormal gait;Difficulty walking;Postural dysfunction;Decreased strength;Decreased mobility;Decreased balance   Rehab Potential Fair   PT Frequency 2x / week   PT Duration 4 weeks   PT Treatment/Interventions Aquatic Therapy;Therapeutic exercise;Therapeutic activities;Functional mobility training;Stair training;Gait training;Balance training;Neuromuscular re-education;Manual techniques;Vestibular   PT Next Visit Plan progress HEP        Problem List Patient Active Problem List   Diagnosis Date Noted  . Sinusitis 03/12/2015  . Colon polyp 01/25/2015  . Bone/cartilage disorder 01/25/2015  . Headache 11/18/2014  . Depression 11/18/2014  . Greater tuberosity of humerus fracture 11/18/2014  . Multiple sclerosis ([place]) 11/18/2014  . Unsteady gait 11/18/2014  . Dizziness 11/18/2014  . Inconclusive mammogram 03/15/2013  . Back ache 09/06/2012  . BP (high blood pressure) 09/06/2012  . HLD (hyperlipidemia) 01/28/2012  . Climacteric 01/28/2012  . Routine general medical examination at a health care facility 01/28/2012  . Avitaminosis D 01/28/2012   Gorden Harms. Porchea Charrier, PT, DPT [phone]  Amiee Wiley 09/03/2015, 5:44 PM  [place] MAIN [facility] SERVICES [address] [place], [place], [address] Phone: [phone]   Fax:  [phone]  Name: Carly [name] MRN: [id number] Date of Birth: 05-18-1951

## 2015-09-05 ENCOUNTER — Ambulatory Visit: Payer: Medicare Other

## 2015-09-10 ENCOUNTER — Inpatient Hospital Stay: Payer: Medicare Other | Attending: Hematology and Oncology

## 2015-09-10 VITALS — BP 125/74 | HR 83 | Temp 97.5°F | Resp 18

## 2015-09-10 DIAGNOSIS — Z79899 Other long term (current) drug therapy: Secondary | ICD-10-CM | POA: Insufficient documentation

## 2015-09-10 DIAGNOSIS — G35 Multiple sclerosis: Secondary | ICD-10-CM | POA: Insufficient documentation

## 2015-09-10 MED ORDER — ACETAMINOPHEN 500 MG PO TABS: 1000.0000 mg | ORAL_TABLET | Freq: Once | ORAL | Status: DC

## 2015-09-10 MED ORDER — SODIUM CHLORIDE 0.9 % IV SOLN
Freq: Once | INTRAVENOUS | Status: AC
  Administered 2015-09-10: 11:00:00 via INTRAVENOUS
  Filled 2015-09-10: qty 1000

## 2015-09-10 MED ORDER — SODIUM CHLORIDE 0.9 % IV SOLN
300.0000 mg | Freq: Once | INTRAVENOUS | Status: AC
  Administered 2015-09-10: 300 mg via INTRAVENOUS
  Filled 2015-09-10: qty 15

## 2015-09-13 ENCOUNTER — Ambulatory Visit: Payer: Medicare Other

## 2015-09-17 ENCOUNTER — Ambulatory Visit: Payer: Medicare Other

## 2015-09-19 ENCOUNTER — Ambulatory Visit: Payer: Medicare Other | Attending: Neurology

## 2015-09-19 DIAGNOSIS — R2681 Unsteadiness on feet: Secondary | ICD-10-CM | POA: Diagnosis not present

## 2015-09-19 DIAGNOSIS — R531 Weakness: Secondary | ICD-10-CM | POA: Diagnosis present

## 2015-09-19 NOTE — Patient Instructions (Signed)
Walking program: walk with rollator hourly

## 2015-09-19 NOTE — Therapy (Signed)
[place] MAIN [facility] SERVICES [address] [place], [place], [address] Phone: [phone]   Fax:  [phone]  Physical Therapy Treatment  Patient Details  Name: Carly [name] MRN: [id number] Date of Birth: 09-17-1951 Referring Provider: Dr. Manuella Ghazi  Encounter Date: 09/19/2015      PT End of Session - 09/19/15 1531    Visit Number 4   Number of Visits 9   Date for PT Re-Evaluation 09/19/15   Authorization Type 4/10   PT Start Time 1415   PT Stop Time 1455   PT Time Calculation (min) 40 min   Equipment Utilized During Treatment Gait belt   Activity Tolerance Patient tolerated treatment well   Behavior During Therapy Anxious      Past Medical History  Diagnosis Date  . Depression   . History of chicken pox   . Frequent headaches     H/O  . Allergy   . Hx of migraines   . History of colon polyps   . Multiple sclerosis ([place])   . Diabetes mellitus without complication [facility])     Past Surgical History  Procedure Laterality Date  . Gallbladder surgery  2008  . Spine surgery  2014  . Foot surgery  2015    There were no vitals filed for this visit.  Visit Diagnosis:  Unsteadiness on feet  Weakness generalized      Subjective Assessment - 09/19/15 1518    Subjective pt reports she has been very sick with a respiratory infection the past week. she reports the back exercises issued last time helped her a lot and her sciatica is much better. pt reports having trouble getting around because she has been so sick and is now weak.   Patient Stated Goals Pt would like to improve her confidence and be able to walk again without her AD.     Currently in Pain? No/denies         Therex: Nustep L 3 x 3 min no charge LAQ 2x10 Sit to stand 3x10 with max cues and CGA. Pt tends to lean posteriorly  Standing weight shift fwd/retro and side to side 2x2 min each  Gait training: intially with SPC and HHA. Cues for sequencing with SPC  and LLE and increaed step length on the R. Pt was unsteady and requiring assist to keep from falling x 10 min Gait training with rollator 2x264ft cues for increased step length on the R and increased foot clearance on the L.                              PT Long Term Goals - 08/23/15 1136    PT LONG TERM GOAL #1   Title pt's TUG time will improve by 15 seconds with LRAD  indicating decreased risk of falls   Baseline 36 seconds on 11/09 with single point cane   Time 4   Period Weeks   Status New   PT LONG TERM GOAL #2   Title pt's gait speed will be at least 0.8 m/s with LRAD for full community ambulation   Baseline 0.55 m/s with single point cane on eval (11/09)   Time 4   Period Weeks   Status New   PT LONG TERM GOAL #3   Title pt's ABC scale score will improve by at least 10 points indicating improved confidence with functional movements    Baseline 41.25% on eval (11/09)  Time 4   Period Weeks   Status New               Plan - 09/19/15 1531    Clinical Impression Statement pt demonstrates decreased mobility and more unsteadiness following her illness last week. PT recommends pt walk with rollator for safety at home. pt did respond well to lumbar extension exercises with less sciatic symptoms. pt had regression of exercise difficulty due to reduced activity tolerance.    Pt will benefit from skilled therapeutic intervention in order to improve on the following deficits Abnormal gait;Difficulty walking;Postural dysfunction;Decreased strength;Decreased mobility;Decreased balance   Rehab Potential Fair   PT Frequency 2x / week   PT Duration 4 weeks   PT Treatment/Interventions Aquatic Therapy;Therapeutic exercise;Therapeutic activities;Functional mobility training;Stair training;Gait training;Balance training;Neuromuscular re-education;Manual techniques;Vestibular   PT Next Visit Plan progress HEP        Problem List Patient Active Problem List    Diagnosis Date Noted  . Sinusitis 03/12/2015  . Colon polyp 01/25/2015  . Bone/cartilage disorder 01/25/2015  . Headache 11/18/2014  . Depression 11/18/2014  . Greater tuberosity of humerus fracture 11/18/2014  . Multiple sclerosis ([place]) 11/18/2014  . Unsteady gait 11/18/2014  . Dizziness 11/18/2014  . Inconclusive mammogram 03/15/2013  . Back ache 09/06/2012  . BP (high blood pressure) 09/06/2012  . HLD (hyperlipidemia) 01/28/2012  . Climacteric 01/28/2012  . Routine general medical examination at a health care facility 01/28/2012  . Avitaminosis D 01/28/2012   Gorden Harms. Kindle Strohmeier, PT, DPT [phone]  Valeta Paz 09/19/2015, 3:33 PM  [place] MAIN [facility] SERVICES [address] [place], [place], [address] Phone: [phone]   Fax:  [phone]  Name: Carly [name] MRN: [id number] Date of Birth: 07-29-1951

## 2015-09-24 ENCOUNTER — Ambulatory Visit: Payer: Medicare Other

## 2015-09-26 ENCOUNTER — Ambulatory Visit: Payer: Medicare Other

## 2015-09-26 DIAGNOSIS — R531 Weakness: Secondary | ICD-10-CM

## 2015-09-26 DIAGNOSIS — R2681 Unsteadiness on feet: Secondary | ICD-10-CM

## 2015-09-26 NOTE — Therapy (Signed)
[place] MAIN [facility] SERVICES [address] [place], [place], [address] Phone: [phone]   Fax:  [phone]  Physical Therapy Treatment Physical Therapy Progress Note 08/23/15 to 09/26/15  Patient Details  Name: Carly [name] MRN: [id number] Date of Birth: [birth year] Referring Provider: Dr. Manuella Ghazi  Encounter Date: 09/26/2015      PT End of Session - 09/26/15 1533    Visit Number 5   Number of Visits 9   Date for PT Re-Evaluation 10/24/15   Authorization Type 1/10   PT Start Time 1415   PT Stop Time 1505   PT Time Calculation (min) 50 min   Equipment Utilized During Treatment Gait belt   Activity Tolerance Patient tolerated treatment well   Behavior During Therapy Anxious      Past Medical History  Diagnosis Date  . Depression   . History of chicken pox   . Frequent headaches     H/O  . Allergy   . Hx of migraines   . History of colon polyps   . Multiple sclerosis ([place])   . Diabetes mellitus without complication [facility])     Past Surgical History  Procedure Laterality Date  . Gallbladder surgery  2008  . Spine surgery  2014  . Foot surgery  2015    There were no vitals filed for this visit.  Visit Diagnosis:  Unsteadiness on feet - Plan: PT plan of care cert/re-cert  Weakness generalized - Plan: PT plan of care cert/re-cert      Subjective Assessment - 09/26/15 1532    Subjective pt reports she seems to be having more trouble walking due to weakness after illness and sciatica.    Patient Stated Goals Pt would like to improve her confidence and be able to walk again without her AD.     Currently in Pain? Yes   Pain Score 3    Pain Location --  L leg             [facility] PT Assessment - 09/26/15 0001    Standardized Balance Assessment   Standardized Balance Assessment Berg Balance Test   Berg Balance Test   Sit to Stand Able to stand  independently using hands   Standing Unsupported Able to stand safely 2  minutes   Sitting with Back Unsupported but Feet Supported on Floor or Stool Able to sit safely and securely 2 minutes   Stand to Sit Controls descent by using hands   Transfers Able to transfer safely, definite need of hands   Standing Unsupported with Eyes Closed Able to stand 10 seconds with supervision   Standing Ubsupported with Feet Together Needs help to attain position but able to stand for 30 seconds with feet together   From Standing, Reach Forward with Outstretched Arm Can reach forward >5 cm safely (2")   From Standing Position, Pick up Object from Floor Able to pick up shoe, needs supervision   From Standing Position, Turn to Look Behind Over each Shoulder Turn sideways only but maintains balance   Turn 360 Degrees Needs close supervision or verbal cueing   Standing Unsupported, Alternately Place Feet on Step/Stool Able to complete >2 steps/needs minimal assist   Standing Unsupported, One Foot in Front Able to take small step independently and hold 30 seconds   Standing on One Leg Tries to lift leg/unable to hold 3 seconds but remains standing independently   Total Score 33     tug: 32s 56mwalk .2100m  Pt needs mod cues to stay on task during out come measures. Measures had to be repeated due to pt pausing to talk .                         PT Education - Oct 17, 2015 1533    Education provided Yes   Education Details plan of care, regression in mobility, berg balance result    Person(s) Educated Patient   Methods Explanation   Comprehension Verbalized understanding             PT Long Term Goals - 10/17/15 1546    PT LONG TERM GOAL #1   Title pt's TUG time will improve by 15 seconds with LRAD  indicating decreased risk of falls   Baseline 36 seconds on 11/09 with single point cane, 32s   Time 4   Period Weeks   Status Partially Met   PT LONG TERM GOAL #2   Title pt's gait speed will be at least 0.8 m/s with LRAD for full community ambulation    Baseline 0.55 m/s with single point cane on eval (11/09), 0.25s 2023/10/17   Time 6   Period Weeks   Status On-going   PT LONG TERM GOAL #3   Title pt's ABC scale score will improve by at least 10 points indicating improved confidence with functional movements    Baseline 41.25% on eval (11/09), 42% 17-Oct-2023   Time 4   Period Weeks   Status On-going               Plan - 10-17-2015 1540    Clinical Impression Statement pt has been in PT for 1 month, however has missed numerous days due to illness. Pts functional mobility and gait have declined likely due to this illness. pt scored 33/56 on berg balance test indicating a high fall risk. pt would benefit from continued skilled PT services to improve gait, balance, to maximize mobility. If pt does not make progress over next 4 weeks she will be DC.. pt is taking 2 weeks off of therapy per her request, but was instructed to be compliance with HEP and walking program.    Pt will benefit from skilled therapeutic intervention in order to improve on the following deficits Abnormal gait;Difficulty walking;Postural dysfunction;Decreased strength;Decreased mobility;Decreased balance   Rehab Potential Fair   PT Frequency 2x / week   PT Duration 6 weeks   PT Treatment/Interventions Aquatic Therapy;Therapeutic exercise;Therapeutic activities;Functional mobility training;Stair training;Gait training;Balance training;Neuromuscular re-education;Manual techniques;Vestibular   PT Next Visit Plan progress HEP          G-Codes - 10/17/15 1548    Functional Assessment Tool Used outcome measures, clinical judgment, history    Functional Limitation Mobility: Walking and moving around   Mobility: Walking and Moving Around Current Status [phone]) At least 60 percent but less than 80 percent impaired, limited or restricted   Mobility: Walking and Moving Around Goal Status [phone]) At least 40 percent but less than 60 percent impaired, limited or restricted       Problem List Patient Active Problem List   Diagnosis Date Noted  . Sinusitis 03/12/2015  . Colon polyp 01/25/2015  . Bone/cartilage disorder 01/25/2015  . Headache 11/18/2014  . Depression 11/18/2014  . Greater tuberosity of humerus fracture 11/18/2014  . Multiple sclerosis ([place]) 11/18/2014  . Unsteady gait 11/18/2014  . Dizziness 11/18/2014  . Inconclusive mammogram 03/15/2013  . Back ache 09/06/2012  . BP (high blood pressure) 09/06/2012  .  HLD (hyperlipidemia) 01/28/2012  . Climacteric 01/28/2012  . Routine general medical examination at a health care facility 01/28/2012  . Avitaminosis D 01/28/2012   Gorden Harms. Nalaya Wojdyla, PT, DPT [phone]   Finlee Concepcion 09/26/2015, 3:50 PM  [place] MAIN [facility] SERVICES [address] [place], [place], [address] Phone: [phone]   Fax:  [phone]  Name: SULLY MANZI MRN: [id number] Date of Birth: September 29, 1951

## 2015-09-28 ENCOUNTER — Ambulatory Visit: Payer: BC Managed Care – PPO | Admitting: Internal Medicine

## 2015-09-28 ENCOUNTER — Telehealth: Payer: Self-pay | Admitting: *Deleted

## 2015-09-28 DIAGNOSIS — Z0289 Encounter for other administrative examinations: Secondary | ICD-10-CM

## 2015-09-28 NOTE — Telephone Encounter (Signed)
FYI

## 2015-09-28 NOTE — Telephone Encounter (Signed)
-----   Message from Ivonne Andrew, NT sent at 09/28/2015  2:02 PM EST ----- Regarding: Patient Cancellation Patient stated that she took a double dose of gabapentin, and now feels as if she shouldn't come in because of her dizziness.

## 2015-10-01 ENCOUNTER — Ambulatory Visit: Payer: BC Managed Care – PPO | Admitting: Family Medicine

## 2015-10-01 ENCOUNTER — Ambulatory Visit: Payer: BC Managed Care – PPO

## 2015-10-03 ENCOUNTER — Ambulatory Visit: Payer: Medicare Other

## 2015-10-03 ENCOUNTER — Ambulatory Visit: Payer: Medicare Other | Admitting: Hematology and Oncology

## 2015-10-10 ENCOUNTER — Ambulatory Visit: Payer: Medicare Other | Admitting: Hematology and Oncology

## 2015-10-10 ENCOUNTER — Ambulatory Visit: Payer: Medicare Other

## 2015-10-16 ENCOUNTER — Encounter: Payer: Self-pay | Admitting: Internal Medicine

## 2015-10-16 DIAGNOSIS — Z0000 Encounter for general adult medical examination without abnormal findings: Secondary | ICD-10-CM | POA: Insufficient documentation

## 2015-10-16 DIAGNOSIS — N83202 Unspecified ovarian cyst, left side: Secondary | ICD-10-CM

## 2015-10-16 DIAGNOSIS — N83201 Unspecified ovarian cyst, right side: Secondary | ICD-10-CM | POA: Insufficient documentation

## 2015-10-17 ENCOUNTER — Ambulatory Visit: Payer: Medicare Other

## 2015-10-17 ENCOUNTER — Encounter: Payer: Self-pay | Admitting: Hematology and Oncology

## 2015-10-17 ENCOUNTER — Inpatient Hospital Stay: Payer: Medicare Other

## 2015-10-17 ENCOUNTER — Inpatient Hospital Stay: Payer: Medicare Other | Attending: Hematology and Oncology | Admitting: Hematology and Oncology

## 2015-10-17 VITALS — BP 128/67 | HR 87 | Temp 98.6°F | Resp 18 | Ht 63.0 in

## 2015-10-17 VITALS — BP 160/81 | HR 64

## 2015-10-17 DIAGNOSIS — E119 Type 2 diabetes mellitus without complications: Secondary | ICD-10-CM | POA: Insufficient documentation

## 2015-10-17 DIAGNOSIS — Z86010 Personal history of colonic polyps: Secondary | ICD-10-CM | POA: Insufficient documentation

## 2015-10-17 DIAGNOSIS — G35 Multiple sclerosis: Secondary | ICD-10-CM | POA: Diagnosis not present

## 2015-10-17 DIAGNOSIS — F329 Major depressive disorder, single episode, unspecified: Secondary | ICD-10-CM | POA: Diagnosis not present

## 2015-10-17 DIAGNOSIS — Z79899 Other long term (current) drug therapy: Secondary | ICD-10-CM

## 2015-10-17 MED ORDER — SODIUM CHLORIDE 0.9 % IV SOLN
Freq: Once | INTRAVENOUS | Status: AC
  Administered 2015-10-17: 12:00:00 via INTRAVENOUS
  Filled 2015-10-17: qty 1000

## 2015-10-17 MED ORDER — ACETAMINOPHEN 500 MG PO TABS: 1000.0000 mg | ORAL_TABLET | Freq: Once | ORAL | Status: DC

## 2015-10-17 MED ORDER — SODIUM CHLORIDE 0.9 % IV SOLN
300.0000 mg | Freq: Once | INTRAVENOUS | Status: AC
  Administered 2015-10-17: 300 mg via INTRAVENOUS
  Filled 2015-10-17: qty 15

## 2015-10-17 MED ORDER — ACETAMINOPHEN 500 MG PO TABS
1000.0000 mg | ORAL_TABLET | Freq: Once | ORAL | Status: AC
  Administered 2015-10-17: 1000 mg via ORAL
  Filled 2015-10-17: qty 2

## 2015-10-17 NOTE — Progress Notes (Signed)
[place] [facility] day:  10/17/2015  Chief Complaint: Carly [name] is a 65 y.o. female with multiple sclerosis who is seen for 6 month assessment prior to continuation of monthly Tysabri (natalizumab).  HPI:  The patient was last seen by me in the medical oncology clinic on 04/10/2105.  At that time, she was seen for initial evaluation by me.  She was tolerating her Tysabri well.  She was followed by Dr. Gorden Harms, neurologist, who saw her every 3 months. She received her Tysabri infusions every month.  She denies any problems with her infusions.  She has continued her Tysabri infusions monthly (06/29, 07/27, 08/29, 09/28, 10/27, 09/10/2015).  During the interim, she has done well. She noes getting sick with "bronchial stuff" before Thanksgiving.  She has had no other issues with infections.  She notes that she is 10 days late for her Tysabri infusion.  She notes subsequently getting weak.  When she gets weak, she has balance problems.  Past Medical History  Diagnosis Date  . Depression   . History of chicken pox   . Frequent headaches     H/O  . Allergy   . Hx of migraines   . History of colon polyps   . Multiple sclerosis ([place])   . Diabetes mellitus without complication [facility])     Past Surgical History  Procedure Laterality Date  . Gallbladder surgery  2008  . Spine surgery  2014  . Foot surgery  2015    Family History  Problem Relation Age of Onset  . Arthritis Mother   . Hypertension Mother   . Hypertension Father   . Hyperlipidemia Father   . Heart disease Maternal Grandfather   . Diabetes Maternal Grandfather   . Kidney disease Paternal Grandmother     Social History:  reports that she has never smoked. She has never used smokeless tobacco. She reports that she does not drink alcohol or use illicit drugs.  The patient is alone today.  Allergies:  Allergies  Allergen Reactions  . Asa [Aspirin]   . Buspirone Other (See  Comments)  . Levofloxacin Other (See Comments)    "weakness"  . Motrin [Ibuprofen] Swelling  . Penicillins   . Sulfa Antibiotics   . Ceftin [Cefuroxime Axetil] Rash    Current Medications: Current Outpatient Prescriptions  Medication Sig Dispense Refill  . acetaminophen (TYLENOL) 325 MG tablet Take 650 mg by mouth every 4 (four) hours as needed.    . dalfampridine 10 MG TB12 Take 10 mg by mouth every 12 (twelve) hours.    . Diphenhyd-Hydrocort-Nystatin (FIRST-DUKES MOUTHWASH) SUSP Use as directed 1 Dose in the mouth or throat every 6 (six) hours.    . DULoxetine (CYMBALTA) 30 MG capsule Take 60 mg by mouth daily.  0  . progesterone (PROMETRIUM) 100 MG capsule Take 100 mg by mouth daily.    . pseudoephedrine (SUDAFED) 30 MG tablet Take 30 mg by mouth every 6 (six) hours as needed for congestion.    . sertraline (ZOLOFT) 100 MG tablet Take 200 mg by mouth daily.    . valACYclovir (VALTREX) 500 MG tablet Take 500 mg by mouth 2 (two) times daily.     No current facility-administered medications for this visit.    Review of Systems:  GENERAL:  Weak.  No fevers, sweats or weight loss. PERFORMANCE STATUS (ECOG):  1-2 HEENT:  Sinus headache.  No visual changes, runny nose, sore throat, mouth sores or tenderness. Lungs:  No shortness of breath or cough.  No hemoptysis. Cardiac:  No chest pain, palpitations, orthopnea, or PND. Breasts:  Denies any lumps or skin changes.  Notes up to date on mammogram. GI:  No nausea, vomiting, diarrhea, constipation, melena or hematochezia.  Polyps.  Notes up to date on colonoscopy. GU:  No urgency, frequency, dysuria, or hematuria. Musculoskeletal:  No back pain.  No joint pain.  No muscle tenderness. Extremities:  No pain or swelling. Skin:  No rashes or skin changes. Neuro:  General weakness and subsequent balnace issues.  No headache, numbness or focal weakness. Endocrine:  No diabetes, thyroid issues, hot flashes or night sweats. Psych:  No mood  changes, depression or anxiety. Pain:  No focal pain. Review of systems:  All other systems reviewed and found to be negative.   Physical Exam: Blood pressure 128/67, pulse 87, temperature 98.6 F (37 C), temperature source Tympanic, resp. rate 18, height 5\' 3"  (1.6 m). GENERAL:  Well developed, well nourished, sitting comfortably in the infusion area in no acute distress. MENTAL STATUS:  Alert and oriented to person, place and time. HEAD:  Short brown hair.  Normocephalic, atraumatic, face symmetric, no Cushingoid features. EYES:  Brown eyes.  Pupils equal round and reactive to light and accomodation.  No conjunctivitis or scleral icterus. ENT:  Oropharynx clear without lesion.  Patchy tongue. Mucous membranes moist.  RESPIRATORY:  Clear to auscultation without rales, wheezes or rhonchi. CARDIOVASCULAR:  Regular rate and rhythm without murmur, rub or gallop. ABDOMEN:  Soft, non-tender, with active bowel sounds, and no hepatosplenomegaly.  No masses. SKIN:  No rashes, ulcers or lesions. EXTREMITIES: Trace bilateral ankle edema.  No skin discoloration or tenderness.  No palpable cords. LYMPH NODES: No palpable cervical, supraclavicular, axillary or inguinal adenopathy  NEUROLOGICAL: Alert & oriented, cranial nerves II-XII intact; motor strength symmetric; sensation intact; finger to nose shaky; 2++ bilateral patellar reflexes. PSYCH:  Appropriate.  No visits with results within 3 Day(s) from this visit. Latest known visit with results is:  Abstract on 07/06/2015  Component Date Value Ref Range Status  . Triglycerides 06/22/2015 120  40 - 160 mg/dL Final  . Cholesterol 06/22/2015 217* 0 - 200 mg/dL Final  . HDL 06/22/2015 97* 35 - 70 mg/dL Final  . LDL Cholesterol 06/22/2015 96   Final  . Hgb A1c MFr Bld 06/22/2015 5.7  4.0 - 6.0 % Final    Assessment:  Carly [name] is a 65 y.o. female with multiple sclerosis for 4 years.  She receives Tysabri monthly.  She is tolerating her  infusions well.  She is 10 days late for her Tysabri infusion (last 09/10/2015).  She has subsequently experienced generalized weakness.  Plan: 1.  Tysabri today. 2.  RTC monthly for Tysabri. 3.  Discuss health maintenance issues (colonoscopy and mammogram). 4.  Follow-up as scheduled with Dr. Gorden Harms. 5.  RTC in 6 months for MD assessment and Tysabri.   Lequita Asal, MD  10/17/2015, 11:22 AM

## 2015-10-22 ENCOUNTER — Ambulatory Visit: Payer: Medicare Other

## 2015-10-24 ENCOUNTER — Ambulatory Visit: Payer: Medicare Other

## 2015-10-25 ENCOUNTER — Ambulatory Visit: Payer: Medicare Other | Attending: Neurology

## 2015-10-25 DIAGNOSIS — R2681 Unsteadiness on feet: Secondary | ICD-10-CM | POA: Diagnosis not present

## 2015-10-25 DIAGNOSIS — R531 Weakness: Secondary | ICD-10-CM | POA: Diagnosis present

## 2015-10-25 NOTE — Therapy (Signed)
[place] MAIN [facility] SERVICES [address] [place], [place], [address] Phone: [phone]   Fax:  [phone]  Physical Therapy Treatment  Patient Details  Name: Carly [name] MRN: [id number] Date of Birth: 1951/05/06 Referring Provider: Dr. Manuella Ghazi  Encounter Date: 10/25/2015      PT End of Session - 10/25/15 1607    Visit Number 6   Number of Visits 17   Date for PT Re-Evaluation 11/22/15   Authorization Type 1/10   PT Start Time 1430   PT Stop Time 1520   PT Time Calculation (min) 50 min   Equipment Utilized During Treatment Gait belt   Activity Tolerance Patient tolerated treatment well   Behavior During Therapy Anxious      Past Medical History  Diagnosis Date  . Depression   . History of chicken pox   . Frequent headaches     H/O  . Allergy   . Hx of migraines   . History of colon polyps   . Multiple sclerosis ([place])   . Diabetes mellitus without complication [facility])     Past Surgical History  Procedure Laterality Date  . Gallbladder surgery  2008  . Spine surgery  2014  . Foot surgery  2015    There were no vitals filed for this visit.  Visit Diagnosis:  Unsteadiness on feet - Plan: PT plan of care cert/re-cert  Weakness generalized - Plan: PT plan of care cert/re-cert      Subjective Assessment - 10/25/15 1606    Subjective pt reports she feels like she is ready to resume therapy. she had a fall yesterday in the bathroom   Patient Stated Goals Pt would like to improve her confidence and be able to walk again without her AD.     Currently in Pain? Yes   Pain Score 3    Pain Location --  R hip      Therex: SPT performed outcome measures:      [facility] PT Assessment - 10/25/15 1443    Standardized Balance Assessment   Standardized Balance Assessment Timed Up and Go Test;10 meter walk test   10 Meter Walk 0.24   Berg Balance Test   Sit to Stand Able to stand  independently using hands   Standing  Unsupported Able to stand safely 2 minutes   Sitting with Back Unsupported but Feet Supported on Floor or Stool Able to sit safely and securely 2 minutes   Stand to Sit Sits safely with minimal use of hands   Transfers Able to transfer with verbal cueing and /or supervision   Standing Unsupported with Eyes Closed Able to stand 10 seconds safely   Standing Ubsupported with Feet Together Able to place feet together independently and stand for 1 minute with supervision   From Standing, Reach Forward with Outstretched Arm Can reach forward >5 cm safely (2")   From Standing Position, Pick up Object from Floor Able to pick up shoe, needs supervision   From Standing Position, Turn to Look Behind Over each Shoulder Turn sideways only but maintains balance   Turn 360 Degrees Needs close supervision or verbal cueing   Standing Unsupported, Alternately Place Feet on Step/Stool Able to complete >2 steps/needs minimal assist   Standing Unsupported, One Foot in Front Able to plae foot ahead of the other independently and hold 30 seconds   Standing on One Leg Tries to lift leg/unable to hold 3 seconds but remains standing independently   Total  Score 37   Timed Up and Go Test   Normal TUG (seconds) 30                             PT Education - 11/20/2015 1607    Education provided Yes   Education Details results of outcome measures, POC, enforced HEP   Person(s) Educated Patient   Methods Explanation   Comprehension Verbalized understanding             PT Long Term Goals - 20-Nov-2015 1609    PT LONG TERM GOAL #1   Title pt's TUG time will improve by 15 seconds with LRAD  indicating decreased risk of falls   Time 4   Period Weeks   Status Partially Met   PT LONG TERM GOAL #2   Title pt's gait speed will be at least 0.8 m/s with LRAD for full community ambulation   Time 6   Period Weeks   Status On-going   PT LONG TERM GOAL #3   Title pt's ABC scale score will improve by  at least 10 points indicating improved confidence with functional movements    Time 4   Period Weeks   Status On-going   PT LONG TERM GOAL #4   Title pt will score >46/56 on berg to reduce risk of falls.    Time 4   Period Weeks   Status New               Plan - Nov 20, 2015 1608    Clinical Impression Statement pt has returned from her break from PT. she reports she feels she is ready to resume therapy and be compliant with appointments and HEP. pts outcome measure and not significantly different vs 1 month ago. She still demonstrates impaired gait, mobilty and balance and is a high fall risk. pt would benefit from continued skilled PT services to address LE strength, balance gait to maximize mobility and reduce fall risk.    Pt will benefit from skilled therapeutic intervention in order to improve on the following deficits Abnormal gait;Difficulty walking;Postural dysfunction;Decreased strength;Decreased mobility;Decreased balance   Rehab Potential Fair   PT Frequency 2x / week   PT Duration 6 weeks   PT Treatment/Interventions Aquatic Therapy;Therapeutic exercise;Therapeutic activities;Functional mobility training;Stair training;Gait training;Balance training;Neuromuscular re-education;Manual techniques;Vestibular   PT Next Visit Plan progress HEP          G-Codes - 11-20-15 1610    Functional Assessment Tool Used outcome measures, clinical judgment, history    Functional Limitation Mobility: Walking and moving around   Mobility: Walking and Moving Around Current Status [phone]) At least 60 percent but less than 80 percent impaired, limited or restricted   Mobility: Walking and Moving Around Goal Status [phone]) At least 40 percent but less than 60 percent impaired, limited or restricted      Problem List Patient Active Problem List   Diagnosis Date Noted  . Bilateral ovarian cysts 10/16/2015  . Health care maintenance 10/16/2015  . Sinusitis 03/12/2015  . Colon polyp  01/25/2015  . Bone/cartilage disorder 01/25/2015  . Headache 11/18/2014  . Depression 11/18/2014  . Greater tuberosity of humerus fracture 11/18/2014  . Multiple sclerosis ([place]) 11/18/2014  . Unsteady gait 11/18/2014  . Dizziness 11/18/2014  . Inconclusive mammogram 03/15/2013  . Back ache 09/06/2012  . BP (high blood pressure) 09/06/2012  . HLD (hyperlipidemia) 01/28/2012  . Climacteric 01/28/2012  . Routine general medical examination at a  health care facility 01/28/2012  . Avitaminosis D 01/28/2012   Gorden Harms. Jahmarion Popoff, PT, DPT [phone]  Peyten Weare 10/25/2015, 4:11 PM  [place] MAIN [facility] SERVICES [address] [place], [place], [address] Phone: [phone]   Fax:  [phone]  Name: Carly [name] MRN: [id number] Date of Birth: 04/15/1951

## 2015-10-29 ENCOUNTER — Ambulatory Visit: Payer: BC Managed Care – PPO

## 2015-10-29 ENCOUNTER — Ambulatory Visit: Payer: Medicare Other

## 2015-10-29 DIAGNOSIS — R2681 Unsteadiness on feet: Secondary | ICD-10-CM

## 2015-10-29 DIAGNOSIS — R531 Weakness: Secondary | ICD-10-CM

## 2015-10-30 NOTE — Therapy (Signed)
[place] MAIN [facility] SERVICES [address] [place], [place], [address] Phone: [phone]   Fax:  [phone]  Physical Therapy Treatment  Patient Details  Name: Carly [name] MRN: [id number] Date of Birth: 03/12/1951 Referring Provider: Dr. Manuella Ghazi  Encounter Date: 10/29/2015      PT End of Session - 10/30/15 1054    Visit Number 7   Number of Visits 17   Date for PT Re-Evaluation 11/22/15   Authorization Type 2/10   PT Start Time 1434   PT Stop Time 1515   PT Time Calculation (min) 41 min   Equipment Utilized During Treatment Gait belt   Activity Tolerance Patient tolerated treatment well   Behavior During Therapy Anxious      Past Medical History  Diagnosis Date  . Depression   . History of chicken pox   . Frequent headaches     H/O  . Allergy   . Hx of migraines   . History of colon polyps   . Multiple sclerosis ([place])   . Diabetes mellitus without complication [facility])     Past Surgical History  Procedure Laterality Date  . Gallbladder surgery  2008  . Spine surgery  2014  . Foot surgery  2015    There were no vitals filed for this visit.  Visit Diagnosis:  Unsteadiness on feet  Weakness generalized      Subjective Assessment - 10/29/15 1850    Subjective Patient reports she continues to have a high fear of falling, no indiciation of any falls since previous session. She brought HEP with her for modificaitons.    Patient Stated Goals Pt would like to improve her confidence and be able to walk again without her AD.     Currently in Pain? --  Patient reports of chronic R hip problem, does not rate pain or state it limits her whatsoever in this sesison.        Assessed safety and efficacy of  Standing hip abductions 1 set x 10 repetitions with bodyweight (more challenging on L side-down)  Standing heel raises x 10 with 3" holds (no hand hold assistance required, challenging for her). 2 sets   Forward gait  in // bars, retro gait in // bars -- proivded retro gait to increase stride length (noticeable change in stride length in // bars without AD).   Side stepping with yellow t-band 2 sets x2 rounds in // bars, 1 set without use of UEs for balance assistance.   Marching in place with HHA bilaterally progressing to 2 finger assistance. 2 sets x 10  Sit to stand (leaning forward) x 5 for 2 sets with cuing to bring her trunk forward and use hands as needed to complete.                           PT Education - 10/30/15 1054    Education provided Yes   Education Details Educated patient on HEP activities to complete as well as appropriate technique.    Person(s) Educated Patient   Methods Explanation;Demonstration;Verbal cues;Handout   Comprehension Verbalized understanding;Returned demonstration             PT Long Term Goals - 10/25/15 1609    PT LONG TERM GOAL #1   Title pt's TUG time will improve by 15 seconds with LRAD  indicating decreased risk of falls   Time 4   Period Weeks   Status Partially  Met   PT LONG TERM GOAL #2   Title pt's gait speed will be at least 0.8 m/s with LRAD for full community ambulation   Time 6   Period Weeks   Status On-going   PT LONG TERM GOAL #3   Title pt's ABC scale score will improve by at least 10 points indicating improved confidence with functional movements    Time 4   Period Weeks   Status On-going   PT LONG TERM GOAL #4   Title pt will score >46/56 on berg to reduce risk of falls.    Time 4   Period Weeks   Status New               Plan - 10/30/15 1055    Clinical Impression Statement Patient continues to demonstrate deficits in LE strength as reflected by difficulty with sit to stand transfer. She improves stride length with multiple bouts of retro ambulation to encourage increased hip extension. Patient provided with updated HEP and assessed for safety, she would benefit from additional therapy to focus on  static/dynamic balance deficits and stride length deficits.    Pt will benefit from skilled therapeutic intervention in order to improve on the following deficits Abnormal gait;Difficulty walking;Postural dysfunction;Decreased strength;Decreased mobility;Decreased balance   Rehab Potential Fair   PT Frequency 2x / week   PT Duration 6 weeks   PT Treatment/Interventions Aquatic Therapy;Therapeutic exercise;Therapeutic activities;Functional mobility training;Stair training;Gait training;Balance training;Neuromuscular re-education;Manual techniques;Vestibular   PT Next Visit Plan Static/dynamic balance, retro ambulation, LE strengthening.    Consulted and Agree with Plan of Care Patient        Problem List Patient Active Problem List   Diagnosis Date Noted  . Bilateral ovarian cysts 10/16/2015  . Health care maintenance 10/16/2015  . Sinusitis 03/12/2015  . Colon polyp 01/25/2015  . Bone/cartilage disorder 01/25/2015  . Headache 11/18/2014  . Depression 11/18/2014  . Greater tuberosity of humerus fracture 11/18/2014  . Multiple sclerosis ([place]) 11/18/2014  . Unsteady gait 11/18/2014  . Dizziness 11/18/2014  . Inconclusive mammogram 03/15/2013  . Back ache 09/06/2012  . BP (high blood pressure) 09/06/2012  . HLD (hyperlipidemia) 01/28/2012  . Climacteric 01/28/2012  . Routine general medical examination at a health care facility 01/28/2012  . Avitaminosis D 01/28/2012    Kerman Passey, PT, DPT    10/30/2015, 1:05 PM  [place] MAIN [facility] SERVICES [address] [place], [place], [address] Phone: [phone]   Fax:  [phone]  Name: Carly [name] MRN: [id number] Date of Birth: 02/28/1951

## 2015-10-31 ENCOUNTER — Ambulatory Visit: Payer: Medicare Other | Admitting: Physical Therapy

## 2015-10-31 ENCOUNTER — Ambulatory Visit: Payer: Medicare Other

## 2015-11-01 ENCOUNTER — Ambulatory Visit: Payer: Medicare Other

## 2015-11-01 DIAGNOSIS — R2681 Unsteadiness on feet: Secondary | ICD-10-CM

## 2015-11-01 DIAGNOSIS — R531 Weakness: Secondary | ICD-10-CM

## 2015-11-01 NOTE — Therapy (Signed)
[place] MAIN [facility] SERVICES [address] [place], [place], [address] Phone: [phone]   Fax:  [phone]  Physical Therapy Treatment  Patient Details  Name: Carly [name] MRN: [id number] Date of Birth: Dec 20, 1950 Referring Provider: Dr. Manuella Ghazi  Encounter Date: 11/01/2015      PT End of Session - 11/01/15 1620    Visit Number 8   Number of Visits 17   Date for PT Re-Evaluation 11/22/15   Authorization Type 3/10   PT Start Time 1515   PT Stop Time 1600   PT Time Calculation (min) 45 min   Equipment Utilized During Treatment Gait belt   Activity Tolerance Patient tolerated treatment well   Behavior During Therapy Anxious;WFL for tasks assessed/performed      Past Medical History  Diagnosis Date  . Depression   . History of chicken pox   . Frequent headaches     H/O  . Allergy   . Hx of migraines   . History of colon polyps   . Multiple sclerosis ([place])   . Diabetes mellitus without complication [facility])     Past Surgical History  Procedure Laterality Date  . Gallbladder surgery  2008  . Spine surgery  2014  . Foot surgery  2015    There were no vitals filed for this visit.  Visit Diagnosis:  Unsteadiness on feet  Weakness generalized      Subjective Assessment - 11/01/15 1605    Subjective pt reports experiencing continuing L side abdominal pain over past 3 days into today. patient reports being compliant with HEP and being able to move from sit to stand better.   Patient Stated Goals Pt would like to improve her confidence and be able to walk again without her AD.     Currently in Pain? Yes   Pain Score 3    Pain Location Abdomen          Therapeutic exercise: Forward step ups on 6 in step 2 sets x 10 reps  Bilateral side step ups on 6 in step 2 sets x 10 reps  Pt required Mod cues for one hand UE assistance with cues for upright posture  Neuromuscular re ed: Weight shift AP on airex 30 sec bouts x 3  sets Weight shift lateral on airex 30 sec bouts x 3 sets NBOS stance on airex in // bars with ball touch to rails with ball 15 reps x 2 sets with min cues for ball watching NBOS Forward and retro gait in // bars 4 laps x 2 with mod cues in increase step length with forward gaze Pt required mod cues for upright posture            PT Long Term Goals - 10/25/15 1609    PT LONG TERM GOAL #1   Title pt's TUG time will improve by 15 seconds with LRAD  indicating decreased risk of falls   Time 4   Period Weeks   Status Partially Met   PT LONG TERM GOAL #2   Title pt's gait speed will be at least 0.8 m/s with LRAD for full community ambulation   Time 6   Period Weeks   Status On-going   PT LONG TERM GOAL #3   Title pt's ABC scale score will improve by at least 10 points indicating improved confidence with functional movements    Time 4   Period Weeks   Status On-going   PT LONG TERM GOAL #4  Title pt will score >46/56 on berg to reduce risk of falls.    Time 4   Period Weeks   Status New               Plan - 11/01/15 1621    Clinical Impression Statement pt demonstrated improved carry over from previous session in sit to stand transfers. pt still demonstrates uncertainty in ambulation without UE assistance. pt will benefit from continued adherance to HEP and continued therapy.   Pt will benefit from skilled therapeutic intervention in order to improve on the following deficits Abnormal gait;Difficulty walking;Postural dysfunction;Decreased strength;Decreased mobility;Decreased balance   Rehab Potential Fair   PT Frequency 2x / week   PT Duration 6 weeks   PT Treatment/Interventions Aquatic Therapy;Therapeutic exercise;Therapeutic activities;Functional mobility training;Stair training;Gait training;Balance training;Neuromuscular re-education;Manual techniques;Vestibular   PT Next Visit Plan Static/dynamic balance, retro ambulation, LE strengthening.    Consulted and Agree  with Plan of Care Patient        Problem List Patient Active Problem List   Diagnosis Date Noted  . Bilateral ovarian cysts 10/16/2015  . Health care maintenance 10/16/2015  . Sinusitis 03/12/2015  . Colon polyp 01/25/2015  . Bone/cartilage disorder 01/25/2015  . Headache 11/18/2014  . Depression 11/18/2014  . Greater tuberosity of humerus fracture 11/18/2014  . Multiple sclerosis ([place]) 11/18/2014  . Unsteady gait 11/18/2014  . Dizziness 11/18/2014  . Inconclusive mammogram 03/15/2013  . Back ache 09/06/2012  . BP (high blood pressure) 09/06/2012  . HLD (hyperlipidemia) 01/28/2012  . Climacteric 01/28/2012  . Routine general medical examination at a health care facility 01/28/2012  . Avitaminosis D 01/28/2012   Domingo Pulse, SPT This entire session was performed under direct supervision and direction of a licensed therapist/therapist assistant . I have personally read, edited and approve of the note as written. Gorden Harms. Tortorici, PT, DPT [phone]  Tortorici,Ashley 11/01/2015, 5:22 PM  [place] MAIN [facility] SERVICES [address] [place], [place], [address] Phone: [phone]   Fax:  [phone]  Name: Carly [name] MRN: [id number] Date of Birth: 30-Dec-1950

## 2015-11-05 ENCOUNTER — Inpatient Hospital Stay: Payer: Medicare Other

## 2015-11-05 ENCOUNTER — Ambulatory Visit: Payer: Medicare Other

## 2015-11-05 DIAGNOSIS — R531 Weakness: Secondary | ICD-10-CM

## 2015-11-05 DIAGNOSIS — R2681 Unsteadiness on feet: Secondary | ICD-10-CM

## 2015-11-05 NOTE — Therapy (Signed)
[place] MAIN [facility] SERVICES [address] [place], [place], [address] Phone: [phone]   Fax:  [phone]  Physical Therapy Treatment  Patient Details  Name: Carly [name] MRN: [id number] Date of Birth: Jun 10, 1951 Referring Provider: Dr. Manuella Ghazi  Encounter Date: 11/05/2015      PT End of Session - 11/05/15 1704    Visit Number 9   Number of Visits 17   Date for PT Re-Evaluation 11/22/15   Authorization Type 4/10   PT Start Time 6568   PT Stop Time 1517   PT Time Calculation (min) 40 min   Equipment Utilized During Treatment Gait belt   Activity Tolerance Patient tolerated treatment well   Behavior During Therapy Anxious;WFL for tasks assessed/performed      Past Medical History  Diagnosis Date  . Depression   . History of chicken pox   . Frequent headaches     H/O  . Allergy   . Hx of migraines   . History of colon polyps   . Multiple sclerosis ([place])   . Diabetes mellitus without complication [facility])     Past Surgical History  Procedure Laterality Date  . Gallbladder surgery  2008  . Spine surgery  2014  . Foot surgery  2015    There were no vitals filed for this visit.  Visit Diagnosis:  Unsteadiness on feet  Weakness generalized      Subjective Assessment - 11/05/15 1646    Subjective pt reports waking up with slight L leg pain stating she didnt think it was caused by activity. pt reports compliance with HEP and working on hip strengthening.    Patient Stated Goals Pt would like to improve her confidence and be able to walk again without her AD.     Pain Score 2    Pain Location --  L leg   Pain Type Acute pain   Pain Onset Today   Multiple Pain Sites No      Pt performed standing repeated lumbar extension - no charge     Gait training:  Ambulation in // bars without UE assistance x 6 laps pt required cues in maintaining level hip with CGA   Neuromuscular re ed:  NBOS AP weight shift on airex  30 sec x 3 sets pt required min cues for heel and toe shifting with CGA NBOS lateral weight shift on airex 30 sec x 3 sets pt required min cues for full weight shift on each leg with CGA D2 UE pattern with ball in staggered stance on airex R to L and L to R 10 reps x 2 sets pt required min assistance in balance Heel to toe rocker with heel lift on airex 10 reps x 3 sets with 2 finger UE support pt required min cues for full heel lift and 2 finger support with CGA          PT Education - 11/05/15 1703    Education provided Yes   Education Details pt educated in glut med contraction in gait to prevent foot drag   Person(s) Educated Patient   Methods Explanation;Verbal cues;Tactile cues   Comprehension Verbalized understanding;Verbal cues required             PT Long Term Goals - 10/25/15 1609    PT LONG TERM GOAL #1   Title pt's TUG time will improve by 15 seconds with LRAD  indicating decreased risk of falls   Time 4   Period Weeks  Status Partially Met   PT LONG TERM GOAL #2   Title pt's gait speed will be at least 0.8 m/s with LRAD for full community ambulation   Time 6   Period Weeks   Status On-going   PT LONG TERM GOAL #3   Title pt's ABC scale score will improve by at least 10 points indicating improved confidence with functional movements    Time 4   Period Weeks   Status On-going   PT LONG TERM GOAL #4   Title pt will score >46/56 on berg to reduce risk of falls.    Time 4   Period Weeks   Status New               Plan - 11/05/15 1707    Clinical Impression Statement SPT monitired LLE pain throughout session which did not worsen. pt continues to demonstrate uncertainty in ambulation without UE support with short step length and high guarding of the UEs.  pt demonstrates improved balance in static balance activities evidenced by requiring less assist when standing with NBOS.    Pt will benefit from skilled therapeutic intervention in order to improve  on the following deficits Abnormal gait;Difficulty walking;Postural dysfunction;Decreased strength;Decreased mobility;Decreased balance   Rehab Potential Fair   PT Frequency 2x / week   PT Duration 6 weeks   PT Treatment/Interventions Aquatic Therapy;Therapeutic exercise;Therapeutic activities;Functional mobility training;Stair training;Gait training;Balance training;Neuromuscular re-education;Manual techniques;Vestibular   PT Next Visit Plan Static/dynamic balance, retro ambulation, LE strengthening.    Consulted and Agree with Plan of Care Patient        Problem List Patient Active Problem List   Diagnosis Date Noted  . Bilateral ovarian cysts 10/16/2015  . Health care maintenance 10/16/2015  . Sinusitis 03/12/2015  . Colon polyp 01/25/2015  . Bone/cartilage disorder 01/25/2015  . Headache 11/18/2014  . Depression 11/18/2014  . Greater tuberosity of humerus fracture 11/18/2014  . Multiple sclerosis ([place]) 11/18/2014  . Unsteady gait 11/18/2014  . Dizziness 11/18/2014  . Inconclusive mammogram 03/15/2013  . Back ache 09/06/2012  . BP (high blood pressure) 09/06/2012  . HLD (hyperlipidemia) 01/28/2012  . Climacteric 01/28/2012  . Routine general medical examination at a health care facility 01/28/2012  . Avitaminosis D 01/28/2012   Domingo Pulse, SPT This entire session was performed under direct supervision and direction of a licensed therapist/therapist assistant . I have personally read, edited and approve of the note as written. Gorden Harms. Tortorici, PT, DPT [phone]   Tortorici,Ashley 11/05/2015, 6:40 PM  [place] MAIN [facility] SERVICES [address] [place], [place], [address] Phone: [phone]   Fax:  [phone]  Name: LASHALA LASER MRN: [id number] Date of Birth: Jan 06, 1951

## 2015-11-07 ENCOUNTER — Ambulatory Visit: Payer: Medicare Other

## 2015-11-08 ENCOUNTER — Ambulatory Visit: Payer: Medicare Other

## 2015-11-08 DIAGNOSIS — R531 Weakness: Secondary | ICD-10-CM

## 2015-11-08 DIAGNOSIS — R2681 Unsteadiness on feet: Secondary | ICD-10-CM

## 2015-11-08 NOTE — Therapy (Signed)
[place] MAIN [facility] SERVICES [address] [place], [place], [address] Phone: [phone]   Fax:  [phone]  Physical Therapy Treatment  Patient Details  Name: Carly [name] MRN: [id number] Date of Birth: [birth year] Referring Provider: Dr. Manuella Ghazi  Encounter Date: 11/08/2015      PT End of Session - 11/08/15 1706    Visit Number 10   Number of Visits 17   Date for PT Re-Evaluation 11/22/15   Authorization Type 5/10   PT Start Time 1300   PT Stop Time 1346   PT Time Calculation (min) 46 min   Equipment Utilized During Treatment Gait belt   Activity Tolerance Patient tolerated treatment well   Behavior During Therapy Anxious;WFL for tasks assessed/performed      Past Medical History  Diagnosis Date  . Depression   . History of chicken pox   . Frequent headaches     H/O  . Allergy   . Hx of migraines   . History of colon polyps   . Multiple sclerosis ([place])   . Diabetes mellitus without complication [facility])     Past Surgical History  Procedure Laterality Date  . Gallbladder surgery  2008  . Spine surgery  2014  . Foot surgery  2015    There were no vitals filed for this visit.  Visit Diagnosis:  Unsteadiness on feet  Weakness generalized      Subjective Assessment - 11/08/15 1643    Subjective pt reports she had little sleep the last night due to her dog's sickness thus feeling fatigued today.   Patient Stated Goals Pt would like to improve her confidence and be able to walk again without her AD.     Currently in Pain? No/denies   Pain Score 0-No pain   Pain Onset Today      Therapeutic exercise:  6 in froward step ups in // bars from airex 10 x 2 sets pt requires CGA for safety with min cues for proper form 6 in bilateral lateral step ups in // bars from airex 10 x 2 sets pt requires min assist for safety with min cues for proper form Bilateral leg lunges in // bars 10 x 2 sets pt requires CGA for safety with  min cues for form  Neuromuscular re ed:  AP weight shifting on airex in // bars 45 sec x 3 sets Lateral weight shifting on airex // bars 45 sec x sets Ambulation in // bars 8 laps with 1 hand UE support pt requires max cues for longer step length, upright posture and larger BOS with CGA          PT Long Term Goals - 10/25/15 1609    PT LONG TERM GOAL #1   Title pt's TUG time will improve by 15 seconds with LRAD  indicating decreased risk of falls   Time 4   Period Weeks   Status Partially Met   PT LONG TERM GOAL #2   Title pt's gait speed will be at least 0.8 m/s with LRAD for full community ambulation   Time 6   Period Weeks   Status On-going   PT LONG TERM GOAL #3   Title pt's ABC scale score will improve by at least 10 points indicating improved confidence with functional movements    Time 4   Period Weeks   Status On-going   PT LONG TERM GOAL #4   Title pt will score >46/56 on berg to reduce  risk of falls.    Time 4   Period Weeks   Status New               Plan - 11/08/15 1707    Clinical Impression Statement pt demonstrates fair carry over in ambulation with step length and upright posture, needing further instruction and cues. pt demonstrates improved tolerance to activities, needing less rest breaks. pt will benefit from continued physical therapy to maximize functional mobility.    Pt will benefit from skilled therapeutic intervention in order to improve on the following deficits Abnormal gait;Difficulty walking;Postural dysfunction;Decreased strength;Decreased mobility;Decreased balance   Rehab Potential Fair   PT Frequency 2x / week   PT Duration 6 weeks   PT Treatment/Interventions Aquatic Therapy;Therapeutic exercise;Therapeutic activities;Functional mobility training;Stair training;Gait training;Balance training;Neuromuscular re-education;Manual techniques;Vestibular   PT Next Visit Plan Static/dynamic balance, retro ambulation, LE strengthening.     Consulted and Agree with Plan of Care Patient        Problem List Patient Active Problem List   Diagnosis Date Noted  . Bilateral ovarian cysts 10/16/2015  . Health care maintenance 10/16/2015  . Sinusitis 03/12/2015  . Colon polyp 01/25/2015  . Bone/cartilage disorder 01/25/2015  . Headache 11/18/2014  . Depression 11/18/2014  . Greater tuberosity of humerus fracture 11/18/2014  . Multiple sclerosis ([facility]) 11/18/2014  . Unsteady gait 11/18/2014  . Dizziness 11/18/2014  . Inconclusive mammogram 03/15/2013  . Back ache 09/06/2012  . BP (high blood pressure) 09/06/2012  . HLD (hyperlipidemia) 01/28/2012  . Climacteric 01/28/2012  . Routine general medical examination at a health care facility 01/28/2012  . Avitaminosis D 01/28/2012    , SPT This entire session was performed under direct supervision and direction of a licensed therapist/therapist assistant . I have personally read, edited and approve of the note as written. Ashley C. Tortorici, PT, DPT #[id number]  Tortorici,Ashley 11/08/2015, 5:23 PM  [facility] [facility] MAIN REHAB SERVICES [address] , [place], [address] Phone: [phone]   Fax:  [phone]  Name: Ziona M Bosserman MRN: [id number] Date of Birth: 04/10/1951     

## 2015-11-12 ENCOUNTER — Ambulatory Visit: Payer: Medicare Other

## 2015-11-13 ENCOUNTER — Ambulatory Visit: Payer: Medicare Other

## 2015-11-13 DIAGNOSIS — R2681 Unsteadiness on feet: Secondary | ICD-10-CM

## 2015-11-13 DIAGNOSIS — R531 Weakness: Secondary | ICD-10-CM

## 2015-11-13 NOTE — Therapy (Signed)
[place] MAIN [facility] SERVICES [address] [place], [place], [address] Phone: [phone]   Fax:  [phone]  Physical Therapy Treatment  Patient Details  Name: Carly [name] MRN: [id number] Date of Birth: Apr 29, 1951 Referring Provider: Dr. Manuella Ghazi  Encounter Date: 11/13/2015      PT End of Session - 11/13/15 1746    Visit Number 11   Number of Visits 17   Date for PT Re-Evaluation 11/22/15   Authorization Type 6/10   PT Start Time 1646   PT Stop Time 1735   PT Time Calculation (min) 49 min   Equipment Utilized During Treatment Gait belt   Activity Tolerance Patient tolerated treatment well   Behavior During Therapy Anxious;WFL for tasks assessed/performed      Past Medical History  Diagnosis Date  . Depression   . History of chicken pox   . Frequent headaches     H/O  . Allergy   . Hx of migraines   . History of colon polyps   . Multiple sclerosis ([place])   . Diabetes mellitus without complication [facility])     Past Surgical History  Procedure Laterality Date  . Gallbladder surgery  2008  . Spine surgery  2014  . Foot surgery  2015    There were no vitals filed for this visit.  Visit Diagnosis:  Unsteadiness on feet  Weakness generalized      Subjective Assessment - 11/13/15 1739    Subjective pt reports she had been feeling sick the last few days and was unable to do HEP.   Patient Stated Goals Pt would like to improve her confidence and be able to walk again without her AD.     Currently in Pain? No/denies   Pain Score 0-No pain        Therapeutic exercise: Squats in // bars with UE support 10 x 3 sets Lateral band walks in // bars with UE support 5 laps Lunges onto  BOSU ball in // bars with 1 hand UE support pt required min cues for step length and focusing weight through heel   Neuromuscular re ed: Staggered stance on airex with weight shifts 30 sec x 3 sets Staggered stance on airex with eyes closed  30 sec x 3 sets Bilateral and step overs in // bars 1 min x 3 sets Ambulation in // bars x 8 laps pt required mod cues for step length, hip control to prevent foot drag, stepping width and upright posture  Patient required CGA for all activities         PT Long Term Goals - 10/25/15 1609    PT LONG TERM GOAL #1   Title pt's TUG time will improve by 15 seconds with LRAD  indicating decreased risk of falls   Time 4   Period Weeks   Status Partially Met   PT LONG TERM GOAL #2   Title pt's gait speed will be at least 0.8 m/s with LRAD for full community ambulation   Time 6   Period Weeks   Status On-going   PT LONG TERM GOAL #3   Title pt's ABC scale score will improve by at least 10 points indicating improved confidence with functional movements    Time 4   Period Weeks   Status On-going   PT LONG TERM GOAL #4   Title pt will score >46/56 on berg to reduce risk of falls.    Time 4   Period Weeks  Status New               Plan - 11/13/15 1747    Clinical Impression Statement pt fatigued by the end of session due to being sick the past few days. pt demonstrated good carry over in gait but still requires cues in form and prefers to stay in parallel bars with UE assistance. pt needs to continue HEP to improve stength and  endurance   Pt will benefit from skilled therapeutic intervention in order to improve on the following deficits Abnormal gait;Difficulty walking;Postural dysfunction;Decreased strength;Decreased mobility;Decreased balance   Rehab Potential Fair   PT Frequency 2x / week   PT Duration 6 weeks   PT Treatment/Interventions Aquatic Therapy;Therapeutic exercise;Therapeutic activities;Functional mobility training;Stair training;Gait training;Balance training;Neuromuscular re-education;Manual techniques;Vestibular   PT Next Visit Plan Static/dynamic balance, retro ambulation, LE strengthening.    Consulted and Agree with Plan of Care Patient        Problem  List Patient Active Problem List   Diagnosis Date Noted  . Bilateral ovarian cysts 10/16/2015  . Health care maintenance 10/16/2015  . Sinusitis 03/12/2015  . Colon polyp 01/25/2015  . Bone/cartilage disorder 01/25/2015  . Headache 11/18/2014  . Depression 11/18/2014  . Greater tuberosity of humerus fracture 11/18/2014  . Multiple sclerosis ([place]) 11/18/2014  . Unsteady gait 11/18/2014  . Dizziness 11/18/2014  . Inconclusive mammogram 03/15/2013  . Back ache 09/06/2012  . BP (high blood pressure) 09/06/2012  . HLD (hyperlipidemia) 01/28/2012  . Climacteric 01/28/2012  . Routine general medical examination at a health care facility 01/28/2012  . Avitaminosis D 01/28/2012   Domingo Pulse, SPT  Tortorici,Ashley 11/13/2015, 6:31 PM This entire session was performed under direct supervision and direction of a licensed therapist/therapist assistant . I have personally read, edited and approve of the note as written. Gorden Harms. Tortorici, PT, DPT [phone]  [place] MAIN [facility] SERVICES [address] [place], [place], [address] Phone: [phone]   Fax:  [phone]  Name: Carly [name] MRN: [id number] Date of Birth: 1951-04-09

## 2015-11-14 ENCOUNTER — Ambulatory Visit: Payer: Medicare Other | Attending: Neurology

## 2015-11-14 ENCOUNTER — Ambulatory Visit: Payer: Medicare Other

## 2015-11-14 DIAGNOSIS — R2681 Unsteadiness on feet: Secondary | ICD-10-CM | POA: Diagnosis not present

## 2015-11-14 DIAGNOSIS — R531 Weakness: Secondary | ICD-10-CM | POA: Diagnosis present

## 2015-11-15 NOTE — Therapy (Signed)
[place] MAIN [facility] SERVICES [address] [place], [place], [address] Phone: [phone]   Fax:  [phone]  Physical Therapy Treatment  Patient Details  Name: Carly [name] MRN: [id number] Date of Birth: 17-Aug-1951 Referring Provider: Dr. Manuella Ghazi  Encounter Date: 11/14/2015      PT End of Session - 11/15/15 0844    Visit Number 12   Number of Visits 17   Date for PT Re-Evaluation 11/22/15   Authorization Type 7/10   PT Start Time 1745   PT Stop Time 1832   PT Time Calculation (min) 47 min   Equipment Utilized During Treatment Gait belt   Activity Tolerance Patient tolerated treatment well   Behavior During Therapy Anxious;WFL for tasks assessed/performed      Past Medical History  Diagnosis Date  . Depression   . History of chicken pox   . Frequent headaches     H/O  . Allergy   . Hx of migraines   . History of colon polyps   . Multiple sclerosis ([place])   . Diabetes mellitus without complication [facility])     Past Surgical History  Procedure Laterality Date  . Gallbladder surgery  2008  . Spine surgery  2014  . Foot surgery  2015    There were no vitals filed for this visit.  Visit Diagnosis:  Unsteadiness on feet  Weakness generalized      Subjective Assessment - 11/15/15 0834    Subjective pt reports feeling like she can go up and down her stairs with hand use better than before.   Patient Stated Goals Pt would like to improve her confidence and be able to walk again without her AD.     Currently in Pain? No/denies   Pain Score 0-No pain       Therapeutic exercise:  Forward step up on 6 in step in // bars 10 x 2 sets with UE support pt required CGA assist for safety Bilateral step ups on 6 in step ups in // bars 10 x 2 sets with UE support pt required mod cues for left hip flexion and DF for foot elevation and CGA for safety Bilateral hip hikes in // bars on 6 in step for glut med activation reduced to hip  hikes on airex due to difficulty 10 x 2 sets pt required UE support and CGA for safety  Neuromuscular re ed:  AP weight shifting on airex in staggered stance on // bars with no UE support pt required CGA for safety Ambulation in // bars x 10 laps pt required mod cues for no UE use, glut med activation and step width with CGA for safety SLS in // bars with 1 UE support 30 sec x 2 bilaterally pt required max cues for correct form and glut med activation with CGA for safety          PT Long Term Goals - 10/25/15 1609    PT LONG TERM GOAL #1   Title pt's TUG time will improve by 15 seconds with LRAD  indicating decreased risk of falls   Time 4   Period Weeks   Status Partially Met   PT LONG TERM GOAL #2   Title pt's gait speed will be at least 0.8 m/s with LRAD for full community ambulation   Time 6   Period Weeks   Status On-going   PT LONG TERM GOAL #3   Title pt's ABC scale score will improve by at  least 10 points indicating improved confidence with functional movements    Time 4   Period Weeks   Status On-going   PT LONG TERM GOAL #4   Title pt will score >46/56 on berg to reduce risk of falls.    Time 4   Period Weeks   Status New               Plan - 11/15/15 0845    Clinical Impression Statement pt continues to struggle with glut med activation during gait with moderate carry over from each repetition. pt demonstrates more confidence in ambulatory exercises.   Pt will benefit from skilled therapeutic intervention in order to improve on the following deficits Abnormal gait;Difficulty walking;Postural dysfunction;Decreased strength;Decreased mobility;Decreased balance   Rehab Potential Fair   PT Frequency 2x / week   PT Duration 6 weeks   PT Treatment/Interventions Aquatic Therapy;Therapeutic exercise;Therapeutic activities;Functional mobility training;Stair training;Gait training;Balance training;Neuromuscular re-education;Manual techniques;Vestibular   PT Next  Visit Plan Static/dynamic balance, retro ambulation, LE strengthening.    Consulted and Agree with Plan of Care Patient        Problem List Patient Active Problem List   Diagnosis Date Noted  . Bilateral ovarian cysts 10/16/2015  . Health care maintenance 10/16/2015  . Sinusitis 03/12/2015  . Colon polyp 01/25/2015  . Bone/cartilage disorder 01/25/2015  . Headache 11/18/2014  . Depression 11/18/2014  . Greater tuberosity of humerus fracture 11/18/2014  . Multiple sclerosis ([place]) 11/18/2014  . Unsteady gait 11/18/2014  . Dizziness 11/18/2014  . Inconclusive mammogram 03/15/2013  . Back ache 09/06/2012  . BP (high blood pressure) 09/06/2012  . HLD (hyperlipidemia) 01/28/2012  . Climacteric 01/28/2012  . Routine general medical examination at a health care facility 01/28/2012  . Avitaminosis D 01/28/2012   Domingo Pulse, SPT This entire session was performed under direct supervision and direction of a licensed therapist/therapist assistant . I have personally read, edited and approve of the note as written. Gorden Harms. Tortorici, PT, DPT [id number]  Tortorici,Ashley 11/15/2015, 9:15 AM  [place] MAIN [facility] SERVICES [address] [place], [place], [address] Phone: [phone]   Fax:  [phone]  Name: MEENA BARRANTES MRN: [id number] Date of Birth: Mar 25, 1951

## 2015-11-19 ENCOUNTER — Ambulatory Visit: Payer: Medicare Other

## 2015-11-20 ENCOUNTER — Ambulatory Visit: Payer: Medicare Other

## 2015-11-20 DIAGNOSIS — R2681 Unsteadiness on feet: Secondary | ICD-10-CM

## 2015-11-20 DIAGNOSIS — R531 Weakness: Secondary | ICD-10-CM

## 2015-11-20 NOTE — Therapy (Signed)
[place] MAIN [facility] SERVICES [address] [place], [place], [address] Phone: [phone]   Fax:  [phone]  Physical Therapy Treatment  Patient Details  Name: Carly [name] MRN: [id number] Date of Birth: [birth year] Referring Provider: Dr. Manuella Ghazi  Encounter Date: 11/20/2015      PT End of Session - 11/20/15 1840    Visit Number 13   Number of Visits 17   Date for PT Re-Evaluation 11/22/15   Authorization Type 8/10   PT Start Time 1747   PT Stop Time 1832   PT Time Calculation (min) 45 min   Equipment Utilized During Treatment Gait belt   Activity Tolerance Patient tolerated treatment well   Behavior During Therapy Anxious;WFL for tasks assessed/performed      Past Medical History  Diagnosis Date  . Depression   . History of chicken pox   . Frequent headaches     H/O  . Allergy   . Hx of migraines   . History of colon polyps   . Multiple sclerosis ([place])   . Diabetes mellitus without complication [facility])     Past Surgical History  Procedure Laterality Date  . Gallbladder surgery  2008  . Spine surgery  2014  . Foot surgery  2015    There were no vitals filed for this visit.  Visit Diagnosis:  Unsteadiness on feet  Weakness generalized      Subjective Assessment - 11/20/15 1838    Subjective pt reports feeling fatigued due to doing yoga earlier in the day.    Patient Stated Goals Pt would like to improve her confidence and be able to walk again without her AD.     Currently in Pain? No/denies   Pain Score 0-No pain     Gait training:  Pt ambulated 40 ft on flat surface with use of cane pt required mod cues for heel strike and glut activation for foot clearance CGA required for safety Ambulation in // bars with 1 UE support x 10 with mod cues for upright posture, heel strike and foot clearance  Neuromuscular re ed:  Bilateral lateral step overs in // bars with UE support 1 min x 3 sets pt required min cues for  hip flexion for foot clearance with CGA for safety Step ups to airex and weight shift in // bars with UE support bilaterally 10 x 2 sets pt required CGA for safety  Therapeutic exercise: Lateral band walks in // bars with yellow band x 2 laps pt required mod cues for correct form with CGA for safety         PT Long Term Goals - 10/25/15 1609    PT LONG TERM GOAL #1   Title pt's TUG time will improve by 15 seconds with LRAD  indicating decreased risk of falls   Time 4   Period Weeks   Status Partially Met   PT LONG TERM GOAL #2   Title pt's gait speed will be at least 0.8 m/s with LRAD for full community ambulation   Time 6   Period Weeks   Status On-going   PT LONG TERM GOAL #3   Title pt's ABC scale score will improve by at least 10 points indicating improved confidence with functional movements    Time 4   Period Weeks   Status On-going   PT LONG TERM GOAL #4   Title pt will score >46/56 on berg to reduce risk of falls.    Time  4   Period Weeks   Status New               Plan - 11/20/15 1841    Clinical Impression Statement pt ambulated on flat surface with use of cane for 65f without rest and demonstrated improved glut activation and less foot drag. pt had good carryover of correct postural position and gait sequencing. Pt was given more rest between sets due to fatigue from previous activity. pt will continue to benefit from gait training in therapy.   Pt will benefit from skilled therapeutic intervention in order to improve on the following deficits Abnormal gait;Difficulty walking;Postural dysfunction;Decreased strength;Decreased mobility;Decreased balance   Rehab Potential Fair   PT Frequency 2x / week   PT Duration 6 weeks   PT Treatment/Interventions Aquatic Therapy;Therapeutic exercise;Therapeutic activities;Functional mobility training;Stair training;Gait training;Balance training;Neuromuscular re-education;Manual techniques;Vestibular   PT Next Visit Plan  Static/dynamic balance, retro ambulation, LE strengthening.    Consulted and Agree with Plan of Care Patient        Problem List Patient Active Problem List   Diagnosis Date Noted  . Bilateral ovarian cysts 10/16/2015  . Health care maintenance 10/16/2015  . Sinusitis 03/12/2015  . Colon polyp 01/25/2015  . Bone/cartilage disorder 01/25/2015  . Headache 11/18/2014  . Depression 11/18/2014  . Greater tuberosity of humerus fracture 11/18/2014  . [place] ([place] 11/18/2014  . Unsteady gait 11/18/2014  . Dizziness 11/18/2014  . Inconclusive mammogram 03/15/2013  . Back ache 09/06/2012  . BP (high blood pressure) 09/06/2012  . HLD (hyperlipidemia) 01/28/2012  . Climacteric 01/28/2012  . Routine general medical examination at a health care facility 01/28/2012  . Avitaminosis D 01/28/2012   CDomingo Pulse SPT This entire session was performed under direct supervision and direction of a licensed therapist/therapist assistant . I have personally read, edited and approve of the note as written. AGorden Harms Tortorici, PT, DPT #[id number] Tortorici,Ashley 11/21/2015, 8:09 AM  [place] [facility] [address] [address]Phone: [phone]  Fax:  [phone] Name: Carly RENNINGERMRN: [id number] of Birth: 1Apr 04, 1952

## 2015-11-21 ENCOUNTER — Inpatient Hospital Stay: Payer: Medicare Other | Attending: Hematology and Oncology

## 2015-11-21 VITALS — BP 122/85 | HR 78 | Temp 98.1°F | Resp 20

## 2015-11-21 DIAGNOSIS — G35 Multiple sclerosis: Secondary | ICD-10-CM

## 2015-11-21 DIAGNOSIS — Z79899 Other long term (current) drug therapy: Secondary | ICD-10-CM | POA: Insufficient documentation

## 2015-11-21 MED ORDER — SODIUM CHLORIDE 0.9 % IV SOLN
Freq: Once | INTRAVENOUS | Status: AC
  Administered 2015-11-21: 11:00:00 via INTRAVENOUS
  Filled 2015-11-21: qty 1000

## 2015-11-21 MED ORDER — ACETAMINOPHEN 500 MG PO TABS
1000.0000 mg | ORAL_TABLET | Freq: Once | ORAL | Status: DC
  Filled 2015-11-21: qty 2

## 2015-11-21 MED ORDER — SODIUM CHLORIDE 0.9 % IV SOLN
300.0000 mg | Freq: Once | INTRAVENOUS | Status: AC
  Administered 2015-11-21: 300 mg via INTRAVENOUS
  Filled 2015-11-21: qty 15

## 2015-11-22 ENCOUNTER — Ambulatory Visit: Payer: Medicare Other

## 2015-11-22 DIAGNOSIS — R531 Weakness: Secondary | ICD-10-CM

## 2015-11-22 DIAGNOSIS — R2681 Unsteadiness on feet: Secondary | ICD-10-CM | POA: Diagnosis not present

## 2015-11-22 NOTE — Therapy (Signed)
[place] MAIN [facility] SERVICES [address] [place], [place], [address] Phone: [phone]   Fax:  [phone]  Physical Therapy Treatment/Physical Therapy Progress Note 10/25/15 to 11/22/15   Patient Details  Name: Carly [name] MRN: [id number] Date of Birth: 1950-12-30 Referring Provider: Dr. Manuella Ghazi  Encounter Date: 11/22/2015      PT End of Session - 11/22/15 1723    Visit Number 14   Number of Visits 25   Date for PT Re-Evaluation 12/20/15   Authorization Type 1/10   PT Start Time 1515   PT Stop Time 1605   PT Time Calculation (min) 50 min   Equipment Utilized During Treatment Gait belt   Activity Tolerance Patient limited by fatigue   Behavior During Therapy Anxious;WFL for tasks assessed/performed      Past Medical History  Diagnosis Date  . Depression   . History of chicken pox   . Frequent headaches     H/O  . Allergy   . Hx of migraines   . History of colon polyps   . Multiple sclerosis ([place])   . Diabetes mellitus without complication [facility])     Past Surgical History  Procedure Laterality Date  . Gallbladder surgery  2008  . Spine surgery  2014  . Foot surgery  2015    There were no vitals filed for this visit.  Visit Diagnosis:  Unsteadiness on feet - Plan: PT plan of care cert/re-cert  Weakness generalized - Plan: PT plan of care cert/re-cert      Subjective Assessment - 11/22/15 1719    Subjective pt reports feeling "weakness" in muscles which has gotten progressively worse over past 2 days. pt states she is going to doctor tomorrow and will ask to check her blood sugar.   Patient Stated Goals Pt would like to improve her confidence and be able to walk again without her AD.     Currently in Pain? No/denies   Pain Score 0-No pain     Therapeutic exercise:  SPT reassessed outcome measures and pt goals:  Berg balance: 39/56 81mwalk: 0.2 m/s ABC-S: 33/45 - 73%         PT Education - 11/22/15  1722    Education provided Yes   Education Details outcome measure results, HEP importance, plan of care   Person(s) Educated Patient   Methods Explanation   Comprehension Verbalized understanding             PT Long Term Goals - 11/22/15 1814    PT LONG TERM GOAL #1   Title pt's TUG time will improve by 15 seconds with LRAD  indicating decreased risk of falls   Time 4   Period Weeks   Status On-going   PT LONG TERM GOAL #2   Title pt's gait speed will be at least 0.8 m/s with LRAD for full community ambulation   Time 6   Period Weeks   Status On-going   PT LONG TERM GOAL #3   Title pt's ABC scale score will improve by at least 10 points indicating improved confidence with functional movements    Time 4   Period Weeks   Status Achieved   PT LONG TERM GOAL #4   Title pt will score >46/56 on berg to reduce risk of falls.    Time 4   Period Weeks   Status Partially Met               Plan -  12/07/15 1726    Clinical Impression Statement pt was limited by fatigue and weakness with todays progress note likely due to MS. pt required multiple rest breaks and decreased in performance of outcome measures as therapy session progressed.pts outcome measures are likely skewed due to her fatigue. 8mwalk time with cane use recorded as .2 m/s and pt scored a 39/56 on the berg. pt will benefit from continued therapy to continue improvement with balance and gait in order to increase pt independence and reduce fall risk.    Pt will benefit from skilled therapeutic intervention in order to improve on the following deficits Abnormal gait;Difficulty walking;Postural dysfunction;Decreased strength;Decreased mobility;Decreased balance   Rehab Potential Fair   PT Frequency 2x / week   PT Duration 4 weeks   PT Treatment/Interventions Aquatic Therapy;Therapeutic exercise;Therapeutic activities;Functional mobility training;Stair training;Gait training;Balance training;Neuromuscular  re-education;Manual techniques;Vestibular   PT Next Visit Plan Static/dynamic balance, retro ambulation, LE strengthening.    Consulted and Agree with Plan of Care Patient          G-Codes - [id number]    Functional Assessment Tool Used BERG, 161malk   Functional Limitation Mobility: Walking and moving around   Mobility: Walking and Moving Around Current Status ([id number] least 40 percent but less than 60 percent impaired, limited or restricted   Mobility: Walking and Moving Around Goal Status ([id number] least 40 percent but less than 60 percent impaired, limited or restricted      Problem List Patient Active Problem List   Diagnosis Date Noted  . Bilateral ovarian cysts 10/16/2015  . Health care maintenance 10/16/2015  . Sinusitis 03/12/2015  . Colon polyp 01/25/2015  . Bone/cartilage disorder 01/25/2015  . Headache 11/18/2014  . Depression 11/18/2014  . Greater tuberosity of humerus fracture 11/18/2014  . Multiple sclerosis ([place]/03/2015  . Unsteady gait 11/18/2014  . Dizziness 11/18/2014  . Inconclusive mammogram 03/15/2013  . Back ache 09/06/2012  . BP (high blood pressure) 09/06/2012  . HLD (hyperlipidemia) 01/28/2012  . Climacteric 01/28/2012  . Routine general medical examination at a health care facility 01/28/2012  . Avitaminosis D 01/28/2012   This entire session was performed under direct supervision and direction of a licensed therapist/therapist assistant . I have personally read, edited and approve of the note as written. Andrell Bergeson ThMarcello MooresSPT  Ashley C. Tortorici, PT, DPT #[id number],Ashley 2/02-24-176:19 PM  [place] [address]hone: [phone] Fax:  [phone]: Carly [name]: [id number] of Birth: [birth year]

## 2015-11-23 ENCOUNTER — Ambulatory Visit (INDEPENDENT_AMBULATORY_CARE_PROVIDER_SITE_OTHER): Payer: Medicare Other | Admitting: Internal Medicine

## 2015-11-23 ENCOUNTER — Encounter: Payer: Self-pay | Admitting: Internal Medicine

## 2015-11-23 VITALS — BP 120/80 | HR 97 | Temp 98.1°F | Resp 18 | Ht 63.0 in | Wt 167.2 lb

## 2015-11-23 DIAGNOSIS — R2681 Unsteadiness on feet: Secondary | ICD-10-CM | POA: Diagnosis not present

## 2015-11-23 DIAGNOSIS — G35 Multiple sclerosis: Secondary | ICD-10-CM

## 2015-11-23 DIAGNOSIS — K635 Polyp of colon: Secondary | ICD-10-CM | POA: Diagnosis not present

## 2015-11-23 DIAGNOSIS — F329 Major depressive disorder, single episode, unspecified: Secondary | ICD-10-CM

## 2015-11-23 DIAGNOSIS — E785 Hyperlipidemia, unspecified: Secondary | ICD-10-CM

## 2015-11-23 DIAGNOSIS — R739 Hyperglycemia, unspecified: Secondary | ICD-10-CM

## 2015-11-23 DIAGNOSIS — F32A Depression, unspecified: Secondary | ICD-10-CM

## 2015-11-23 DIAGNOSIS — N83202 Unspecified ovarian cyst, left side: Secondary | ICD-10-CM

## 2015-11-23 DIAGNOSIS — N83201 Unspecified ovarian cyst, right side: Secondary | ICD-10-CM | POA: Diagnosis not present

## 2015-11-23 NOTE — Progress Notes (Signed)
Patient ID: Carly [name], female   DOB: 01/29/1951, 65 y.o.   MRN: [id number]   Subjective:    Patient ID: Carly [name], female    DOB: [birth year], 65 y.o.   MRN: [id number]  HPI  Patient with past history of hypercholesterolemia, multiple sclerosis and depression/anxiety.  She comes in today to follow up on these issues.  She states she has been doing ok.  Seeing Dr Mike Gip for her MS treatment.  Feels stable.  Has been working with therapy for her gait.  Eating and drinking well.  No chest pain or tightness.  Breathing stable.  Some minimal constipation.  Better if adjust her diet.  Just saw gyn.  Planning for f/u ultrasound in 6 months.    Past Medical History  Diagnosis Date  . Depression   . History of chicken pox   . Frequent headaches     H/O  . Allergy   . Hx of migraines   . History of colon polyps   . Multiple sclerosis ([place])   . Diabetes mellitus without complication [facility])    Past Surgical History  Procedure Laterality Date  . Gallbladder surgery  2008  . Spine surgery  2014  . Foot surgery  2015   Family History  Problem Relation Age of Onset  . Arthritis Mother   . Hypertension Mother   . Hypertension Father   . Hyperlipidemia Father   . Heart disease Maternal Grandfather   . Diabetes Maternal Grandfather   . Kidney disease Paternal Grandmother    Social History   Social History  . Marital Status: Married    Spouse Name: N/A  . Number of Children: N/A  . Years of Education: N/A   Social History Main Topics  . Smoking status: Never Smoker   . Smokeless tobacco: Never Used  . Alcohol Use: No  . Drug Use: No  . Sexual Activity: Not Asked   Other Topics Concern  . None   Social History Narrative    Outpatient Encounter Prescriptions as of 11/23/2015  Medication Sig  . acetaminophen (TYLENOL) 325 MG tablet Take 650 mg by mouth every 4 (four) hours as needed.  . ARIPiprazole (ABILIFY) 5 MG tablet Take 2.5 mg by mouth daily.  . B Complex  Vitamins (VITAMIN B COMPLEX PO) Take by mouth.  Marland Kitchen CALCIUM PO Take 400 mg by mouth daily.  . Calcium-Phosphorus-Vitamin D (CITRACAL +D3 PO) Take by mouth. 2 tablets twice a day  . Cholecalciferol (D3-1000 PO) Take by mouth.  . Coenzyme Q10 (CO Q10) 100 MG CAPS Take by mouth.  . dalfampridine 10 MG TB12 Take 10 mg by mouth every 12 (twelve) hours.  . DULoxetine (CYMBALTA) 30 MG capsule Take 60 mg by mouth daily.  Marland Kitchen gabapentin (NEURONTIN) 100 MG capsule 2 (two) times daily.  . Multiple Vitamins-Minerals (CENTRUM SILVER ULTRA WOMENS PO) Take by mouth.  . natalizumab (TYSABRI) 300 MG/15ML injection Inject into the vein.  . progesterone (PROMETRIUM) 100 MG capsule Take 100 mg by mouth daily.  . pseudoephedrine (SUDAFED) 30 MG tablet Take 30 mg by mouth every 6 (six) hours as needed for congestion.  . sertraline (ZOLOFT) 100 MG tablet Take 200 mg by mouth daily.  . valACYclovir (VALTREX) 500 MG tablet Take 500 mg by mouth 2 (two) times daily.  . vitamin B-12 (CYANOCOBALAMIN) 1000 MCG tablet Take 1,000 mcg by mouth daily.  . [DISCONTINUED] Diphenhyd-Hydrocort-Nystatin (FIRST-DUKES MOUTHWASH) SUSP Use as directed 1 Dose in the mouth or throat every  6 (six) hours.   No facility-administered encounter medications on file as of 11/23/2015.    Review of Systems  Constitutional: Negative for appetite change and unexpected weight change.  HENT: Negative for congestion and sinus pressure.   Respiratory: Negative for cough, chest tightness and shortness of breath.   Cardiovascular: Negative for chest pain, palpitations and leg swelling.  Gastrointestinal: Positive for constipation. Negative for nausea, vomiting, abdominal pain and diarrhea.  Genitourinary: Negative for dysuria and difficulty urinating.  Musculoskeletal: Negative for back pain and joint swelling.  Skin: Negative for color change and rash.  Neurological: Negative for dizziness, light-headedness and headaches.  Psychiatric/Behavioral:        Increased anxiety.  New to coming to the office.  Gets anxious when coming in.  Feeling better after we talked.        Objective:     Blood pressure rechecked by me:  114/78  Physical Exam  Constitutional: She appears well-developed and well-nourished. She appears distressed.  HENT:  Nose: Nose normal.  Mouth/Throat: Oropharynx is clear and moist.  Eyes: Conjunctivae are normal. Right eye exhibits no discharge. Left eye exhibits no discharge.  Neck: Neck supple. No thyromegaly present.  Cardiovascular: Normal rate and regular rhythm.   Pulmonary/Chest: Breath sounds normal. No respiratory distress. She has no wheezes.  Abdominal: Soft. Bowel sounds are normal. There is no tenderness.  Musculoskeletal: She exhibits no edema or tenderness.  Lymphadenopathy:    She has no cervical adenopathy.  Skin: No rash noted. No erythema.  Psychiatric: She has a normal mood and affect. Her behavior is normal.    BP 120/80 mmHg  Pulse 97  Temp(Src) 98.1 F (36.7 C) (Oral)  Resp 18  Ht 5\' 3"  (1.6 m)  Wt 167 lb 4 oz (75.864 kg)  BMI 29.63 kg/m2  SpO2 95% Wt Readings from Last 3 Encounters:  11/23/15 167 lb 4 oz (75.864 kg)  04/11/15 164 lb (74.39 kg)  03/06/15 167 lb 2 oz (75.807 kg)     Lab Results  Component Value Date   WBC 7.6 09/18/2014   HGB 13.6 09/18/2014   HCT 41.1 09/18/2014   PLT 264 09/18/2014   GLUCOSE 88 09/18/2014   CHOL 217* 06/22/2015   TRIG 120 06/22/2015   HDL 97* 06/22/2015   LDLCALC 96 06/22/2015   ALT 42 09/18/2014   AST 23 09/18/2014   NA 138 09/18/2014   K 4.0 09/18/2014   CL 101 09/18/2014   CREATININE 0.83 09/18/2014   BUN 9 09/18/2014   CO2 27 09/18/2014   TSH 1.75 09/18/2014   HGBA1C 5.7 06/22/2015       Assessment & Plan:   Problem List Items Addressed This Visit    Bilateral ovarian cysts    Followed by gyn.  Had ultrasound 10/25/15.  Recommended 6 month f/u ultrasound off HRT.        Colon polyp - Primary    Last colonoscopy 2012.   Due f/u colonoscopy in 2017.        Depression    Followed by Dr Nicolasa Ducking.  Stable.  Still has some anxiety.  Overall controlled.  Follow.        Relevant Orders   CBC with Differential/Platelet   TSH   HLD (hyperlipidemia)    Low cholesterol diet and exercise.  Follow lipid panel.        Relevant Orders   Lipid panel   Hepatic function panel   Multiple sclerosis ([place])    Seeing Dr Mike Gip.  See her note for details.  Follow.  Overall appears to be stable.        Relevant Medications   natalizumab (TYSABRI) 300 MG/15ML injection   Other Relevant Orders   Basic metabolic panel   Unsteady gait    Undergoing therapy.  Overall stable.  Follow.        Other Visit Diagnoses    Hyperglycemia        Relevant Orders    Hemoglobin A1c        Einar Pheasant, MD

## 2015-11-23 NOTE — Progress Notes (Signed)
Pre-visit discussion using our clinic review tool. No additional management support is needed unless otherwise documented below in the visit note.  

## 2015-11-24 ENCOUNTER — Encounter: Payer: Self-pay | Admitting: Internal Medicine

## 2015-11-24 NOTE — Assessment & Plan Note (Signed)
Seeing Dr Mike Gip.  See her note for details.  Follow.  Overall appears to be stable.

## 2015-11-24 NOTE — Assessment & Plan Note (Signed)
Undergoing therapy.  Overall stable.  Follow.

## 2015-11-24 NOTE — Assessment & Plan Note (Signed)
Last colonoscopy 2012.  Due f/u colonoscopy in 2017.

## 2015-11-24 NOTE — Assessment & Plan Note (Signed)
Followed by gyn.  Had ultrasound 10/25/15.  Recommended 6 month f/u ultrasound off HRT.

## 2015-11-24 NOTE — Assessment & Plan Note (Signed)
Followed by Dr Nicolasa Ducking.  Stable.  Still has some anxiety.  Overall controlled.  Follow.

## 2015-11-24 NOTE — Assessment & Plan Note (Signed)
Low cholesterol diet and exercise.  Follow lipid panel.   

## 2015-11-26 ENCOUNTER — Ambulatory Visit: Payer: BC Managed Care – PPO

## 2015-11-28 ENCOUNTER — Ambulatory Visit: Payer: Medicare Other

## 2015-11-28 DIAGNOSIS — R2681 Unsteadiness on feet: Secondary | ICD-10-CM

## 2015-11-28 DIAGNOSIS — R531 Weakness: Secondary | ICD-10-CM

## 2015-11-28 NOTE — Therapy (Signed)
[place] MAIN [facility] SERVICES [address] [place], [place], [address] Phone: [phone]   Fax:  [phone]  Physical Therapy Treatment  Patient Details  Name: Carly [name] MRN: [id number] Date of Birth: [birth year] Referring Provider: Dr. Manuella Ghazi  Encounter Date: 11/28/2015      PT End of Session - 11/28/15 1706    Visit Number 15   Number of Visits 25   Date for PT Re-Evaluation 12/20/15   Authorization Type 2/10   PT Start Time 1435   PT Stop Time 1515   PT Time Calculation (min) 40 min   Equipment Utilized During Treatment Gait belt   Activity Tolerance Patient limited by fatigue   Behavior During Therapy Anxious;WFL for tasks assessed/performed      Past Medical History  Diagnosis Date  . Depression   . History of chicken pox   . Frequent headaches     H/O  . Allergy   . Hx of migraines   . History of colon polyps   . Multiple sclerosis ([place])   . Diabetes mellitus without complication [facility])     Past Surgical History  Procedure Laterality Date  . Gallbladder surgery  2008  . Spine surgery  2014  . Foot surgery  2015    There were no vitals filed for this visit.  Visit Diagnosis:  Unsteadiness on feet  Weakness generalized      Subjective Assessment - 11/28/15 1704    Subjective pt reports she is feeling better since last week. she has been doing HEP   Patient Stated Goals Pt would like to improve her confidence and be able to walk again without her AD.     Currently in Pain? No/denies   Pain Onset Today      Gait training: Toe taps onto 4inch step no UE 2x20, cues for weight shift, CGA -min A for safety Side stepping yellow band in // bars x 5 laps max cues for weight shift to clear alt foot Heel/toe rock/shift in // bars single and no UE support x 10 min with mod/ max cues for weight shifting Fwd amb in // bars x 5 laps with mod-max cues for weight transfer and step length started with single UE then  to no UE Fwd ambulation in // bars with SPC cues for weight transfer L>R , CGA for safety, pt then began to increase foot drag likely due to fatigue.                             PT Education - 11/28/15 1706    Education provided Yes   Education Details Improving weight transfer espeically to the L   Person(s) Educated Patient   Methods Explanation   Comprehension Verbalized understanding             PT Long Term Goals - 11/22/15 1814    PT LONG TERM GOAL #1   Title pt's TUG time will improve by 15 seconds with LRAD  indicating decreased risk of falls   Time 4   Period Weeks   Status On-going   PT LONG TERM GOAL #2   Title pt's gait speed will be at least 0.8 m/s with LRAD for full community ambulation   Time 6   Period Weeks   Status On-going   PT LONG TERM GOAL #3   Title pt's ABC scale score will improve by at least 10 points indicating  improved confidence with functional movements    Time 4   Period Weeks   Status Achieved   PT LONG TERM GOAL #4   Title pt will score >46/56 on berg to reduce risk of falls.    Time 4   Period Weeks   Status Partially Met               Plan - 11/28/15 1707    Clinical Impression Statement session focused on weight transfer to the LLE in particular in order to reduce foot drag and improve step length. pt did well most of the session, but fatiguing more towards the end of the session. pt needs mod-max cues however to promote weight transfer.    Pt will benefit from skilled therapeutic intervention in order to improve on the following deficits Abnormal gait;Difficulty walking;Postural dysfunction;Decreased strength;Decreased mobility;Decreased balance   Rehab Potential Fair   PT Frequency 2x / week   PT Duration 4 weeks   PT Treatment/Interventions Aquatic Therapy;Therapeutic exercise;Therapeutic activities;Functional mobility training;Stair training;Gait training;Balance training;Neuromuscular  re-education;Manual techniques;Vestibular   PT Next Visit Plan Static/dynamic balance, retro ambulation, LE strengthening.    Consulted and Agree with Plan of Care Patient        Problem List Patient Active Problem List   Diagnosis Date Noted  . Bilateral ovarian cysts 10/16/2015  . Health care maintenance 10/16/2015  . Sinusitis 03/12/2015  . Colon polyp 01/25/2015  . Bone/cartilage disorder 01/25/2015  . Headache 11/18/2014  . Depression 11/18/2014  . Greater tuberosity of humerus fracture 11/18/2014  . Multiple sclerosis ([place]) 11/18/2014  . Unsteady gait 11/18/2014  . Dizziness 11/18/2014  . Inconclusive mammogram 03/15/2013  . Back ache 09/06/2012  . BP (high blood pressure) 09/06/2012  . HLD (hyperlipidemia) 01/28/2012  . Climacteric 01/28/2012  . Routine general medical examination at a health care facility 01/28/2012  . Avitaminosis D 01/28/2012   Gorden Harms. [place]ene [place], PT, DPT [phone]  Garnell Begeman 11/28/2015, 5:11 PM  [place] MAIN [facility] SERVICES [address] [place], [place], [address] Phone: [phone]   Fax:  [phone]  Name: Carly [name] MRN: [id number] Date of Birth: [birth year]

## 2015-12-03 ENCOUNTER — Ambulatory Visit: Payer: Medicare Other

## 2015-12-03 ENCOUNTER — Inpatient Hospital Stay: Payer: Medicare Other

## 2015-12-05 ENCOUNTER — Ambulatory Visit: Payer: Medicare Other

## 2015-12-06 ENCOUNTER — Ambulatory Visit: Payer: Medicare Other

## 2015-12-06 DIAGNOSIS — R2681 Unsteadiness on feet: Secondary | ICD-10-CM | POA: Diagnosis not present

## 2015-12-06 DIAGNOSIS — R531 Weakness: Secondary | ICD-10-CM

## 2015-12-06 NOTE — Therapy (Signed)
[place] MAIN [facility] SERVICES [address] [place], [place], [address] Phone: [phone]   Fax:  [phone]  Physical Therapy Treatment  Patient Details  Name: Carly [name] MRN: [id number] Date of Birth: 1950/10/17 Referring Provider: Dr. Manuella Ghazi  Encounter Date: 12/06/2015      PT End of Session - 12/06/15 1807    Visit Number 16   Number of Visits 25   Date for PT Re-Evaluation 12/20/15   Authorization Type 3/10   PT Start Time 1648   PT Stop Time 1739   PT Time Calculation (min) 51 min   Equipment Utilized During Treatment Gait belt   Activity Tolerance Patient limited by fatigue   Behavior During Therapy Anxious;WFL for tasks assessed/performed      Past Medical History  Diagnosis Date  . Depression   . History of chicken pox   . Frequent headaches     H/O  . Allergy   . Hx of migraines   . History of colon polyps   . Multiple sclerosis ([place])   . Diabetes mellitus without complication [facility])     Past Surgical History  Procedure Laterality Date  . Gallbladder surgery  2008  . Spine surgery  2014  . Foot surgery  2015    There were no vitals filed for this visit.  Visit Diagnosis:  Unsteadiness on feet  Weakness generalized      Subjective Assessment - 12/06/15 1805    Subjective pt reports that she has been feeling sick the last few days and is feeling weak. pt states that she wanted to try using the walk aid to see if it will help.   Patient Stated Goals Pt would like to improve her confidence and be able to walk again without her AD.     Currently in Pain? No/denies   Pain Onset Today      Gait training:  AP weight shifts in // bars on airex 30 sec x 3 sets Lateral weight shifts in // bars on airex 30 sec x sets Weight shifts in stepping pattern in // bars 2 min x 4 bouts  with cues for hip extension, knee flexion and heel strike and to increase step length Ambulation in // bars x 8 laps with UE  support with cues for hip extension, knee flexion and heel strike and to increase step length Retro ambulation in // bars x 5 laps with UE support with cues for hip extension, knee flexion and heel strike and to increase step length        PT Education - 12/06/15 1806    Education provided Yes   Education Details pt educated gait sequence and hip extension during gait and purpose of the walk aid device   Person(s) Educated Patient   Methods Explanation;Demonstration;Tactile cues;Verbal cues;Handout   Comprehension Verbalized understanding;Returned demonstration;Verbal cues required;Tactile cues required             PT Long Term Goals - 11/22/15 1814    PT LONG TERM GOAL #1   Title pt's TUG time will improve by 15 seconds with LRAD  indicating decreased risk of falls   Time 4   Period Weeks   Status On-going   PT LONG TERM GOAL #2   Title pt's gait speed will be at least 0.8 m/s with LRAD for full community ambulation   Time 6   Period Weeks   Status On-going   PT LONG TERM GOAL #3   Title  pt's ABC scale score will improve by at least 10 points indicating improved confidence with functional movements    Time 4   Period Weeks   Status Achieved   PT LONG TERM GOAL #4   Title pt will score >46/56 on berg to reduce risk of falls.    Time 4   Period Weeks   Status Partially Met               Plan - 12/06/15 1809    Clinical Impression Statement pt expressed emotion during session due to feeling overwhelmed with variables related to MS. pt was able to demonstrate improved hip extension, knee flexion and step length by end of session with slowed gait in parallel bars. pt was better able to shift weight in ambuation with verbal and tactile cues. pt will benefit from skilled therapy in order to improve gait, balance and strength.   Pt will benefit from skilled therapeutic intervention in order to improve on the following deficits Abnormal gait;Difficulty walking;Postural  dysfunction;Decreased strength;Decreased mobility;Decreased balance   Rehab Potential Fair   PT Frequency 2x / week   PT Duration 4 weeks   PT Treatment/Interventions Aquatic Therapy;Therapeutic exercise;Therapeutic activities;Functional mobility training;Stair training;Gait training;Balance training;Neuromuscular re-education;Manual techniques;Vestibular   PT Next Visit Plan Static/dynamic balance, retro ambulation, LE strengthening.    Consulted and Agree with Plan of Care Patient        Problem List Patient Active Problem List   Diagnosis Date Noted  . Bilateral ovarian cysts 10/16/2015  . Health care maintenance 10/16/2015  . Sinusitis 03/12/2015  . Colon polyp 01/25/2015  . Bone/cartilage disorder 01/25/2015  . Headache 11/18/2014  . Depression 11/18/2014  . Greater tuberosity of humerus fracture 11/18/2014  . Multiple sclerosis ([place]) 11/18/2014  . Unsteady gait 11/18/2014  . Dizziness 11/18/2014  . Inconclusive mammogram 03/15/2013  . Back ache 09/06/2012  . BP (high blood pressure) 09/06/2012  . HLD (hyperlipidemia) 01/28/2012  . Climacteric 01/28/2012  . Routine general medical examination at a health care facility 01/28/2012  . Avitaminosis D 01/28/2012   Domingo Pulse, SPT This entire session was performed under direct supervision and direction of a licensed therapist/therapist assistant . I have personally read, edited and approve of the note as written. Gorden Harms. Tortorici, PT, DPT [phone]  Tortorici,Ashley 12/06/2015, 6:35 PM  [place] MAIN [facility] SERVICES [address] [place], [place], [address] Phone: [phone]   Fax:  [phone]  Name: Carly [name] MRN: [id number] Date of Birth: July 14, 1951

## 2015-12-11 ENCOUNTER — Ambulatory Visit: Payer: Medicare Other

## 2015-12-11 ENCOUNTER — Other Ambulatory Visit: Payer: Medicare Other

## 2015-12-11 DIAGNOSIS — R531 Weakness: Secondary | ICD-10-CM

## 2015-12-11 DIAGNOSIS — R2681 Unsteadiness on feet: Secondary | ICD-10-CM | POA: Diagnosis not present

## 2015-12-11 NOTE — Therapy (Signed)
[place] MAIN [facility] SERVICES [address] [place], [place], [address] Phone: [phone]   Fax:  [phone]  Physical Therapy Treatment  Patient Details  Name: Carly [name] MRN: [id number] Date of Birth: 11-23-1950 Referring Provider: Dr. Manuella Ghazi  Encounter Date: 12/11/2015      PT End of Session - 12/11/15 1723    Visit Number 17   Number of Visits 25   Date for PT Re-Evaluation 12/20/15   Authorization Type 4/10   PT Start Time 1430   PT Stop Time 1535   PT Time Calculation (min) 65 min   Equipment Utilized During Treatment Gait belt   Activity Tolerance Patient limited by fatigue   Behavior During Therapy Anxious;WFL for tasks assessed/performed      Past Medical History  Diagnosis Date  . Depression   . History of chicken pox   . Frequent headaches     H/O  . Allergy   . Hx of migraines   . History of colon polyps   . Multiple sclerosis ([place])   . Diabetes mellitus without complication [facility])     Past Surgical History  Procedure Laterality Date  . Gallbladder surgery  2008  . Spine surgery  2014  . Foot surgery  2015    There were no vitals filed for this visit.  Visit Diagnosis:  Unsteadiness on feet  Weakness generalized      Subjective Assessment - 12/11/15 1722    Subjective pt reports she is nervous about using the walkaid   Patient Stated Goals Pt would like to improve her confidence and be able to walk again without her AD.     Currently in Pain? No/denies   Pain Onset Today      Gait training: PT performed fitting of walk aid onto the L LE. PT performed gait analysis and walk aid configuration and training using "walkanalyst" soft ware Gait training with walk aid and without alternating 131f x 25 min to compare usage with and without the device, using rollator, CGA for safety. Pt required mod cues to increase step length and hip extension                           PT  Education - 12/11/15 1722    Education provided Yes   Education Details walkaid usage/indications. importance of gait mechanics to maximize benefit of walkaid   Person(s) Educated Patient;Spouse   Methods Explanation   Comprehension Verbalized understanding             PT Long Term Goals - 11/22/15 1814    PT LONG TERM GOAL #1   Title pt's TUG time will improve by 15 seconds with LRAD  indicating decreased risk of falls   Time 4   Period Weeks   Status On-going   PT LONG TERM GOAL #2   Title pt's gait speed will be at least 0.8 m/s with LRAD for full community ambulation   Time 6   Period Weeks   Status On-going   PT LONG TERM GOAL #3   Title pt's ABC scale score will improve by at least 10 points indicating improved confidence with functional movements    Time 4   Period Weeks   Status Achieved   PT LONG TERM GOAL #4   Title pt will score >46/56 on berg to reduce risk of falls.    Time 4   Period Weeks   Status  Partially Met               Plan - 12/11/15 1723    Clinical Impression Statement pt did fair with walk-aid usage today. It did improve her foot clearance and ability to take longer more consistance steps, however pt needs mod cues to keep longer step length bilaterally and CGA for safety. will perform further trial in future visits, however pt needs to improve general gait mechaincs IE safety, hip extension and toe off to maximize benefit of the device.    Pt will benefit from skilled therapeutic intervention in order to improve on the following deficits Abnormal gait;Difficulty walking;Postural dysfunction;Decreased strength;Decreased mobility;Decreased balance   Rehab Potential Fair   PT Frequency 2x / week   PT Duration 4 weeks   PT Treatment/Interventions Aquatic Therapy;Therapeutic exercise;Therapeutic activities;Functional mobility training;Stair training;Gait training;Balance training;Neuromuscular re-education;Manual techniques;Vestibular   PT Next  Visit Plan Static/dynamic balance, retro ambulation, LE strengthening.    Consulted and Agree with Plan of Care Patient        Problem List Patient Active Problem List   Diagnosis Date Noted  . Bilateral ovarian cysts 10/16/2015  . Health care maintenance 10/16/2015  . Sinusitis 03/12/2015  . Colon polyp 01/25/2015  . Bone/cartilage disorder 01/25/2015  . Headache 11/18/2014  . Depression 11/18/2014  . Greater tuberosity of humerus fracture 11/18/2014  . Multiple sclerosis ([place]) 11/18/2014  . Unsteady gait 11/18/2014  . Dizziness 11/18/2014  . Inconclusive mammogram 03/15/2013  . Back ache 09/06/2012  . BP (high blood pressure) 09/06/2012  . HLD (hyperlipidemia) 01/28/2012  . Climacteric 01/28/2012  . Routine general medical examination at a health care facility 01/28/2012  . Avitaminosis D 01/28/2012   Gorden Harms. Freddye Cardamone, PT, DPT [phone]  Shoshana Johal 12/11/2015, 5:25 PM  [place] MAIN [facility] SERVICES [address] [place], [place], [address] Phone: [phone]   Fax:  [phone]  Name: DULCEMARIA BULA MRN: [id number] Date of Birth: [birth year]

## 2015-12-17 ENCOUNTER — Ambulatory Visit: Payer: Medicare Other

## 2015-12-18 ENCOUNTER — Ambulatory Visit: Payer: Medicare Other | Attending: Neurology

## 2015-12-18 DIAGNOSIS — R531 Weakness: Secondary | ICD-10-CM

## 2015-12-18 DIAGNOSIS — R2681 Unsteadiness on feet: Secondary | ICD-10-CM | POA: Diagnosis not present

## 2015-12-19 ENCOUNTER — Inpatient Hospital Stay: Payer: Medicare Other | Attending: Hematology and Oncology

## 2015-12-19 VITALS — BP 128/72 | HR 76 | Temp 98.2°F | Resp 24

## 2015-12-19 DIAGNOSIS — Z5111 Encounter for antineoplastic chemotherapy: Secondary | ICD-10-CM | POA: Diagnosis not present

## 2015-12-19 DIAGNOSIS — G35 Multiple sclerosis: Secondary | ICD-10-CM | POA: Diagnosis not present

## 2015-12-19 MED ORDER — SODIUM CHLORIDE 0.9 % IV SOLN
300.0000 mg | Freq: Once | INTRAVENOUS | Status: AC
  Administered 2015-12-19: 300 mg via INTRAVENOUS
  Filled 2015-12-19: qty 15

## 2015-12-19 MED ORDER — SODIUM CHLORIDE 0.9 % IV SOLN
Freq: Once | INTRAVENOUS | Status: AC
  Administered 2015-12-19: 11:00:00 via INTRAVENOUS
  Filled 2015-12-19: qty 1000

## 2015-12-19 NOTE — Therapy (Signed)
[place] MAIN [facility] SERVICES [address] [place], [place], [address] Phone: [phone]   Fax:  [phone]  Physical Therapy Treatment  Patient Details  Name: Carly [name] MRN: [id number] Date of Birth: Jan 04, 1951 Referring Provider: Dr. Manuella Ghazi  Encounter Date: 12/18/2015      PT End of Session - 12/19/15 0940    Visit Number 18   Number of Visits 25   Date for PT Re-Evaluation 12/20/15   Authorization Type 5/10   PT Start Time 1515   PT Stop Time 1600   PT Time Calculation (min) 45 min   Equipment Utilized During Treatment Gait belt   Activity Tolerance Patient limited by fatigue   Behavior During Therapy Anxious;WFL for tasks assessed/performed      Past Medical History  Diagnosis Date  . Depression   . History of chicken pox   . Frequent headaches     H/O  . Allergy   . Hx of migraines   . History of colon polyps   . Multiple sclerosis ([place])   . Diabetes mellitus without complication [facility])     Past Surgical History  Procedure Laterality Date  . Gallbladder surgery  2008  . Spine surgery  2014  . Foot surgery  2015    There were no vitals filed for this visit.  Visit Diagnosis:  Unsteadiness on feet  Weakness generalized      Subjective Assessment - 12/18/15 1524    Subjective pt reports she went to yoga before coming to PT. she reports her LLE was hurting, but now feels a little numb. she reports she did not feel too fatigued after last session   Patient Stated Goals Pt would like to improve her confidence and be able to walk again without her AD.     Currently in Pain? No/denies   Pain Onset Today      Gait training: Heel/toe rock progressing to no UE in // bars. Cues for promoting hip extension toe off and knee flexion. Pt needed verbal and tactile cues L>R side x 25 min Heel toe rock progressed to using SPC in // bars then progressing to over ground 2x14f. Pt needs mod verbal , tactile cues and  CGA. Pt still quite anxious needing frequent re instruction.                            PT Education - 12/19/15 0940    Education provided Yes   Education Details extensive education on gait mechanics, to promote hip exension, toe off and knee flexion during swing    Person(s) Educated Patient   Methods Explanation   Comprehension Verbalized understanding;Returned demonstration;Verbal cues required;Tactile cues required;Need further instruction             PT Long Term Goals - 11/22/15 1814    PT LONG TERM GOAL #1   Title pt's TUG time will improve by 15 seconds with LRAD  indicating decreased risk of falls   Time 4   Period Weeks   Status On-going   PT LONG TERM GOAL #2   Title pt's gait speed will be at least 0.8 m/s with LRAD for full community ambulation   Time 6   Period Weeks   Status On-going   PT LONG TERM GOAL #3   Title pt's ABC scale score will improve by at least 10 points indicating improved confidence with functional movements    Time 4  Period Weeks   Status Achieved   PT LONG TERM GOAL #4   Title pt will score >46/56 on berg to reduce risk of falls.    Time 4   Period Weeks   Status Partially Met               Plan - 12/19/15 0940    Clinical Impression Statement extensive time spent today wtih weight shifting with education to promote improved hip extension, toe  off and knee flexion during gait, which should help better utalize the walk aid. pt had fair carry over into over ground at end of session, but needs CGA for balance and is still anxious about taking longer strides.    Pt will benefit from skilled therapeutic intervention in order to improve on the following deficits Abnormal gait;Difficulty walking;Postural dysfunction;Decreased strength;Decreased mobility;Decreased balance   Rehab Potential Fair   PT Frequency 2x / week   PT Duration 4 weeks   PT Treatment/Interventions Aquatic Therapy;Therapeutic  exercise;Therapeutic activities;Functional mobility training;Stair training;Gait training;Balance training;Neuromuscular re-education;Manual techniques;Vestibular   PT Next Visit Plan Static/dynamic balance, retro ambulation, LE strengthening.    Consulted and Agree with Plan of Care Patient        Problem List Patient Active Problem List   Diagnosis Date Noted  . Bilateral ovarian cysts 10/16/2015  . Health care maintenance 10/16/2015  . Sinusitis 03/12/2015  . Colon polyp 01/25/2015  . Bone/cartilage disorder 01/25/2015  . Headache 11/18/2014  . Depression 11/18/2014  . Greater tuberosity of humerus fracture 11/18/2014  . Multiple sclerosis ([place]) 11/18/2014  . Unsteady gait 11/18/2014  . Dizziness 11/18/2014  . Inconclusive mammogram 03/15/2013  . Back ache 09/06/2012  . BP (high blood pressure) 09/06/2012  . HLD (hyperlipidemia) 01/28/2012  . Climacteric 01/28/2012  . Routine general medical examination at a health care facility 01/28/2012  . Avitaminosis D 01/28/2012   Gorden Harms. Clarissa Laird, PT, DPT [phone]  Winona Sison 12/19/2015, 9:44 AM  [place] MAIN [facility] SERVICES [address] [place], [place], [address] Phone: [phone]   Fax:  [phone]  Name: RHYANN BERTON MRN: [id number] Date of Birth: Nov 24, 1950

## 2015-12-20 ENCOUNTER — Ambulatory Visit: Payer: Medicare Other

## 2015-12-20 ENCOUNTER — Other Ambulatory Visit (INDEPENDENT_AMBULATORY_CARE_PROVIDER_SITE_OTHER): Payer: Medicare Other

## 2015-12-20 DIAGNOSIS — F329 Major depressive disorder, single episode, unspecified: Secondary | ICD-10-CM

## 2015-12-20 DIAGNOSIS — G35 Multiple sclerosis: Secondary | ICD-10-CM | POA: Diagnosis not present

## 2015-12-20 DIAGNOSIS — R739 Hyperglycemia, unspecified: Secondary | ICD-10-CM | POA: Diagnosis not present

## 2015-12-20 DIAGNOSIS — F32A Depression, unspecified: Secondary | ICD-10-CM

## 2015-12-20 DIAGNOSIS — R2681 Unsteadiness on feet: Secondary | ICD-10-CM

## 2015-12-20 DIAGNOSIS — R531 Weakness: Secondary | ICD-10-CM

## 2015-12-20 DIAGNOSIS — E785 Hyperlipidemia, unspecified: Secondary | ICD-10-CM

## 2015-12-20 LAB — CBC WITH DIFFERENTIAL/PLATELET
BASOS ABS: 0.1 10*3/uL (ref 0.0–0.1)
Basophils Relative: 0.7 % (ref 0.0–3.0)
EOS ABS: 0.3 10*3/uL (ref 0.0–0.7)
Eosinophils Relative: 4.2 % (ref 0.0–5.0)
HCT: 39.7 % (ref 36.0–46.0)
Hemoglobin: 13.6 g/dL (ref 12.0–15.0)
LYMPHS ABS: 2.9 10*3/uL (ref 0.7–4.0)
Lymphocytes Relative: 38.7 % (ref 12.0–46.0)
MCHC: 34.3 g/dL (ref 30.0–36.0)
MCV: 82.2 fl (ref 78.0–100.0)
MONO ABS: 0.5 10*3/uL (ref 0.1–1.0)
Monocytes Relative: 6.1 % (ref 3.0–12.0)
NEUTROS PCT: 50.3 % (ref 43.0–77.0)
Neutro Abs: 3.8 10*3/uL (ref 1.4–7.7)
Platelets: 285 10*3/uL (ref 150.0–400.0)
RBC: 4.82 Mil/uL (ref 3.87–5.11)
RDW: 16.7 % — ABNORMAL HIGH (ref 11.5–15.5)
WBC: 7.6 10*3/uL (ref 4.0–10.5)

## 2015-12-20 LAB — HEPATIC FUNCTION PANEL
ALT: 27 U/L (ref 0–35)
AST: 23 U/L (ref 0–37)
Albumin: 4.7 g/dL (ref 3.5–5.2)
Alkaline Phosphatase: 83 U/L (ref 39–117)
BILIRUBIN TOTAL: 0.5 mg/dL (ref 0.2–1.2)
Bilirubin, Direct: 0.1 mg/dL (ref 0.0–0.3)
Total Protein: 7.1 g/dL (ref 6.0–8.3)

## 2015-12-20 LAB — LIPID PANEL
Cholesterol: 259 mg/dL — ABNORMAL HIGH (ref 0–200)
HDL: 64.3 mg/dL (ref >39.00)
NonHDL: 194.88
TRIGLYCERIDES: 205 mg/dL — AB (ref 0.0–149.0)
Total CHOL/HDL Ratio: 4
VLDL: 41 mg/dL — ABNORMAL HIGH (ref 0.0–40.0)

## 2015-12-20 LAB — BASIC METABOLIC PANEL
BUN: 9 mg/dL (ref 6–23)
CO2: 29 mEq/L (ref 19–32)
CREATININE: 0.69 mg/dL (ref 0.40–1.20)
Calcium: 10.2 mg/dL (ref 8.4–10.5)
Chloride: 104 mEq/L (ref 96–112)
GFR: 90.63 mL/min (ref >60.00)
Glucose, Bld: 104 mg/dL — ABNORMAL HIGH (ref 70–99)
Potassium: 4.6 mEq/L (ref 3.5–5.1)
Sodium: 143 mEq/L (ref 135–145)

## 2015-12-20 LAB — HEMOGLOBIN A1C: HEMOGLOBIN A1C: 5.8 % (ref 4.6–6.5)

## 2015-12-20 LAB — TSH: TSH: 1.31 u[IU]/mL (ref 0.35–4.50)

## 2015-12-20 LAB — LDL CHOLESTEROL, DIRECT: Direct LDL: 138 mg/dL

## 2015-12-20 NOTE — Therapy (Addendum)
[place] MAIN [facility] SERVICES [address] [place], [place], [address] Phone: [phone]   Fax:  [phone]  Physical Therapy Treatment  Patient Details  Name: Carly [name] MRN: [id number] Date of Birth: 1951/06/19 Referring Provider: Dr. Manuella Ghazi  Encounter Date: 12/20/2015      PT End of Session - 12/20/15 1523    Visit Number 19   Number of Visits 25   Date for PT Re-Evaluation 01/17/16   Authorization Type 1/10   PT Start Time 1430   PT Stop Time 1515   PT Time Calculation (min) 45 min   Equipment Utilized During Treatment Gait belt   Activity Tolerance Patient limited by fatigue   Behavior During Therapy Anxious;WFL for tasks assessed/performed      Past Medical History  Diagnosis Date  . Depression   . History of chicken pox   . Frequent headaches     H/O  . Allergy   . Hx of migraines   . History of colon polyps   . Multiple sclerosis ([place])   . Diabetes mellitus without complication [facility])     Past Surgical History  Procedure Laterality Date  . Gallbladder surgery  2008  . Spine surgery  2014  . Foot surgery  2015    There were no vitals filed for this visit.  Visit Diagnosis:  Unsteadiness on feet  Weakness generalized      Subjective Assessment - 12/20/15 1521    Subjective pt reports she is having more anxiety about walking. she reports practicing her weight shift at home.    Patient Stated Goals Pt would like to improve her confidence and be able to walk again without her AD.     Currently in Pain? No/denies   Pain Onset Today       gait training: Heel/toe rock 20 rocks x 5 each leg Over ground gait training with initially SPC x 57f. Pt was quite anxious  Over ground gait training with rollator (no walk aide ) 1073fx 2 With walk aide 10064f 4  Pt required mod cues for increased step length, increased L foot clearance and increased toe off to promote hip extension.  Pt requires CGA with SPC,  SBA with rollator. Pt advised to use rollator for safety at all times.                           PT Education - 12/20/15 1522    Education provided Yes   Education Details need for tibial tilt with walk aide   Person(s) Educated Patient   Methods Explanation   Comprehension Verbalized understanding             PT Long Term Goals - 12/20/15 1526    PT LONG TERM GOAL #1   Title pt's TUG time will improve by 15 seconds with LRAD  indicating decreased risk of falls   Time 4   Period Weeks   Status On-going   PT LONG TERM GOAL #2   Title pt's gait speed will be at least 0.8 m/s with LRAD for full community ambulation   Time 6   Period Weeks   Status On-going   PT LONG TERM GOAL #3   Title pt's ABC scale score will improve by at least 10 points indicating improved confidence with functional movements    Time 4   Period Weeks   Status Achieved   PT LONG TERM GOAL #  4   Title pt will score >46/56 on berg to reduce risk of falls.    Time 4   Period Weeks   Status Partially Met               Plan - 2016/01/02 1523    Clinical Impression Statement pt did somewhat better with carry over of increasing hip extension and toe off during gait. pts gait quaility is much improved when walking with rollator like due to a combination of anxiety and improved stability. Walkaid better facilitates ankle DF more consistantly than without.    Pt will benefit from skilled therapeutic intervention in order to improve on the following deficits Abnormal gait;Difficulty walking;Postural dysfunction;Decreased strength;Decreased mobility;Decreased balance   Rehab Potential Fair   PT Frequency 2x / week   PT Duration 4 weeks   PT Treatment/Interventions Aquatic Therapy;Therapeutic exercise;Therapeutic activities;Functional mobility training;Stair training;Gait training;Balance training;Neuromuscular re-education;Manual techniques;Vestibular   PT Next Visit Plan Static/dynamic  balance, retro ambulation, LE strengthening.    Consulted and Agree with Plan of Care Patient          G-Codes - 2016-01-02 1526    Functional Assessment Tool Used clinical judgement,gait, Berg   Functional Limitation Mobility: Walking and moving around   Mobility: Walking and Moving Around Current Status [phone]) At least 40 percent but less than 60 percent impaired, limited or restricted   Mobility: Walking and Moving Around Goal Status [phone]) At least 20 percent but less than 40 percent impaired, limited or restricted      Problem List Patient Active Problem List   Diagnosis Date Noted  . Bilateral ovarian cysts 10/16/2015  . Health care maintenance 10/16/2015  . Sinusitis 03/12/2015  . Colon polyp 01/25/2015  . Bone/cartilage disorder 01/25/2015  . Headache 11/18/2014  . Depression 11/18/2014  . Greater tuberosity of humerus fracture 11/18/2014  . Multiple sclerosis ([place]) 11/18/2014  . Unsteady gait 11/18/2014  . Dizziness 11/18/2014  . Inconclusive mammogram 03/15/2013  . Back ache 09/06/2012  . BP (high blood pressure) 09/06/2012  . HLD (hyperlipidemia) 01/28/2012  . Climacteric 01/28/2012  . Routine general medical examination at a health care facility 01/28/2012  . Avitaminosis D 01/28/2012   Gorden Harms. Ellarose Brandi, PT, DPT [phone]  [name] 01-02-2016, 3:27 PM  [place] MAIN [facility] SERVICES [address] [place], [place], [address] Phone: [phone]   Fax:  [phone]  Name: Carly [name] MRN: [id number] Date of Birth: [birth year]

## 2015-12-21 ENCOUNTER — Encounter: Payer: Self-pay | Admitting: *Deleted

## 2015-12-24 ENCOUNTER — Ambulatory Visit: Payer: Medicare Other

## 2015-12-24 ENCOUNTER — Ambulatory Visit: Payer: BC Managed Care – PPO

## 2015-12-24 DIAGNOSIS — R2681 Unsteadiness on feet: Secondary | ICD-10-CM | POA: Diagnosis not present

## 2015-12-24 DIAGNOSIS — R531 Weakness: Secondary | ICD-10-CM

## 2015-12-24 NOTE — Addendum Note (Signed)
Addended by: Mariane Masters on: 12/24/2015 10:27 AM   Modules accepted: Orders

## 2015-12-24 NOTE — Therapy (Signed)
[place] MAIN [facility] SERVICES [address] [place], [place], [address] Phone: [phone]   Fax:  [phone]  Physical Therapy Treatment  Patient Details  Name: Carly [name] MRN: [id number] Date of Birth: [birth year] Referring Provider: Dr. Manuella Ghazi  Encounter Date: 12/24/2015      PT End of Session - 12/24/15 1523    Visit Number 20   Number of Visits 25   Date for PT Re-Evaluation 01/17/16   Authorization Type 2/10   PT Start Time 7078   PT Stop Time 1515   PT Time Calculation (min) 38 min   Equipment Utilized During Treatment Gait belt   Activity Tolerance Patient limited by fatigue   Behavior During Therapy Anxious;WFL for tasks assessed/performed      Past Medical History  Diagnosis Date  . Depression   . History of chicken pox   . Frequent headaches     H/O  . Allergy   . Hx of migraines   . History of colon polyps   . Multiple sclerosis ([place])   . Diabetes mellitus without complication [facility])     Past Surgical History  Procedure Laterality Date  . Gallbladder surgery  2008  . Spine surgery  2014  . Foot surgery  2015    There were no vitals filed for this visit.  Visit Diagnosis:  Unsteadiness on feet  Weakness generalized      Subjective Assessment - 12/24/15 1523    Subjective pt reports she has been practicing walking at home with rollator. pt also reports she will be attending counciling for her anxiety about walking and fear of falling.    Patient Stated Goals Pt would like to improve her confidence and be able to walk again without her AD.     Currently in Pain? No/denies   Pain Onset Today      NMR: Over ground ambulation with rollator, min cues for increased step length, pt had good carry over from prev. Sessions Walk aid fitting: PT unable to find motor point with nerve stimulator to facilitate sufficient DF. ( no charge) On [employer]RE[employer]: standing wide BOS 1 min x 3 Standing wide BOS with arms  overhead 2x10 Standing wide BOS with ball toss onto wall 2x15 Standing wide BOS with trunk twists (ball and off) x 10 each side  pt requires CGA for safety on balance exercises                                  PT Long Term Goals - 12/20/15 1526    PT LONG TERM GOAL #1   Title pt's TUG time will improve by 15 seconds with LRAD  indicating decreased risk of falls   Time 4   Period Weeks   Status On-going   PT LONG TERM GOAL #2   Title pt's gait speed will be at least 0.8 m/s with LRAD for full community ambulation   Time 6   Period Weeks   Status On-going   PT LONG TERM GOAL #3   Title pt's ABC scale score will improve by at least 10 points indicating improved confidence with functional movements    Time 4   Period Weeks   Status Achieved   PT LONG TERM GOAL #4   Title pt will score >46/56 on berg to reduce risk of falls.    Time 4   Period Weeks   Status Partially  Met               Plan - 12/24/15 1524    Clinical Impression Statement PT unable to find motor point with walkaid today, may be due to walkaid equipment failure. pt progressed static and dynamic balance today as her balance still is quite poor. she would benefit from imrpoved balance in order to directly have a positive effect on her gait in addition to gait training.    Pt will benefit from skilled therapeutic intervention in order to improve on the following deficits Abnormal gait;Difficulty walking;Postural dysfunction;Decreased strength;Decreased mobility;Decreased balance   Rehab Potential Fair   PT Frequency 2x / week   PT Duration 4 weeks   PT Treatment/Interventions Aquatic Therapy;Therapeutic exercise;Therapeutic activities;Functional mobility training;Stair training;Gait training;Balance training;Neuromuscular re-education;Manual techniques;Vestibular   PT Next Visit Plan Static/dynamic balance, retro ambulation, LE strengthening.    Consulted and Agree with Plan of Care  Patient        Problem List Patient Active Problem List   Diagnosis Date Noted  . Bilateral ovarian cysts 10/16/2015  . Health care maintenance 10/16/2015  . Sinusitis 03/12/2015  . Colon polyp 01/25/2015  . Bone/cartilage disorder 01/25/2015  . Headache 11/18/2014  . Depression 11/18/2014  . Greater tuberosity of humerus fracture 11/18/2014  . Multiple sclerosis ([place]) 11/18/2014  . Unsteady gait 11/18/2014  . Dizziness 11/18/2014  . Inconclusive mammogram 03/15/2013  . Back ache 09/06/2012  . BP (high blood pressure) 09/06/2012  . HLD (hyperlipidemia) 01/28/2012  . Climacteric 01/28/2012  . Routine general medical examination at a health care facility 01/28/2012  . Avitaminosis D 01/28/2012   Gorden Harms. Aylinn Rydberg, PT, DPT [phone]  Isaia Hassell 12/24/2015, 3:25 PM  [place] MAIN [facility] SERVICES [address] [place], [place], [address] Phone: [phone]   Fax:  [phone]  Name: Carly [name] MRN: [id number] Date of Birth: 15-Sep-1951

## 2015-12-26 ENCOUNTER — Ambulatory Visit: Payer: Medicare Other

## 2015-12-27 ENCOUNTER — Ambulatory Visit: Payer: Medicare Other

## 2015-12-31 ENCOUNTER — Inpatient Hospital Stay: Payer: Medicare Other

## 2016-01-02 ENCOUNTER — Ambulatory Visit: Payer: Medicare Other

## 2016-01-02 DIAGNOSIS — R2681 Unsteadiness on feet: Secondary | ICD-10-CM | POA: Diagnosis not present

## 2016-01-02 DIAGNOSIS — R531 Weakness: Secondary | ICD-10-CM

## 2016-01-02 NOTE — Therapy (Signed)
[place] MAIN [facility] SERVICES [address] [place], [place], [address] Phone: [phone]   Fax:  [phone]  Physical Therapy Treatment  Patient Details  Name: Carly [name] MRN: [id number] Date of Birth: Aug 06, 1951 Referring Provider: Dr. Manuella Ghazi  Encounter Date: 01/02/2016      PT End of Session - 01/02/16 1658    Visit Number 21   Number of Visits 25   Date for PT Re-Evaluation 01/17/16   Authorization Type 3/10   PT Start Time 1615   PT Stop Time 1650   PT Time Calculation (min) 35 min   Equipment Utilized During Treatment Gait belt   Activity Tolerance Patient limited by fatigue   Behavior During Therapy Anxious;WFL for tasks assessed/performed      Past Medical History  Diagnosis Date  . Depression   . History of chicken pox   . Frequent headaches     H/O  . Allergy   . Hx of migraines   . History of colon polyps   . Multiple sclerosis ([place])   . Diabetes mellitus without complication [facility])     Past Surgical History  Procedure Laterality Date  . Gallbladder surgery  2008  . Spine surgery  2014  . Foot surgery  2015    There were no vitals filed for this visit.  Visit Diagnosis:  Unsteadiness on feet  Weakness generalized      Subjective Assessment - 01/02/16 1657    Subjective pt reports she took an allergy pill which has made her very sleepy   Patient Stated Goals Pt would like to improve her confidence and be able to walk again without her AD.     Currently in Pain? No/denies   Pain Onset Today         gait training with walk aid x 20 min. Using rollator, with and without walk aide on intensity level 3, cues for toe off , SBA for safety short rest every 17f  NMR  Standing with ball toss on mirror 3x20 Standing NBOS with trunk turns 3x10 Standing balance with EO/EC 10s x 8  pt requires CGA for safety on balance exercises                               PT Long Term  Goals - 12/20/15 1526    PT LONG TERM GOAL #1   Title pt's TUG time will improve by 15 seconds with LRAD  indicating decreased risk of falls   Time 4   Period Weeks   Status On-going   PT LONG TERM GOAL #2   Title pt's gait speed will be at least 0.8 m/s with LRAD for full community ambulation   Time 6   Period Weeks   Status On-going   PT LONG TERM GOAL #3   Title pt's ABC scale score will improve by at least 10 points indicating improved confidence with functional movements    Time 4   Period Weeks   Status Achieved   PT LONG TERM GOAL #4   Title pt will score >46/56 on berg to reduce risk of falls.    Time 4   Period Weeks   Status Partially Met               Plan - 01/02/16 1658    Clinical Impression Statement PT session shortened today due to pts drowsiness. pt has some benefit from using  the walk aid, however at times her gait is so inconsistent that it does not provide much benefit, also at times it produces clonus. pt has been seeing psychologist regarding her anxiety   Pt will benefit from skilled therapeutic intervention in order to improve on the following deficits Abnormal gait;Difficulty walking;Postural dysfunction;Decreased strength;Decreased mobility;Decreased balance   Rehab Potential Fair   PT Frequency 2x / week   PT Duration 4 weeks   PT Treatment/Interventions Aquatic Therapy;Therapeutic exercise;Therapeutic activities;Functional mobility training;Stair training;Gait training;Balance training;Neuromuscular re-education;Manual techniques;Vestibular   PT Next Visit Plan Static/dynamic balance, retro ambulation, LE strengthening.    Consulted and Agree with Plan of Care Patient        Problem List Patient Active Problem List   Diagnosis Date Noted  . Bilateral ovarian cysts 10/16/2015  . Health care maintenance 10/16/2015  . Sinusitis 03/12/2015  . Colon polyp 01/25/2015  . Bone/cartilage disorder 01/25/2015  . Headache 11/18/2014  . Depression  11/18/2014  . Greater tuberosity of humerus fracture 11/18/2014  . Multiple sclerosis ([place]) 11/18/2014  . Unsteady gait 11/18/2014  . Dizziness 11/18/2014  . Inconclusive mammogram 03/15/2013  . Back ache 09/06/2012  . BP (high blood pressure) 09/06/2012  . HLD (hyperlipidemia) 01/28/2012  . Climacteric 01/28/2012  . Routine general medical examination at a health care facility 01/28/2012  . Avitaminosis D 01/28/2012   Gorden Harms. Cainan Trull, PT, DPT [phone] Dacari Beckstrand 01/02/2016, 5:01 PM  [place] MAIN [facility] SERVICES [address] [place], [place], [address] Phone: [phone]   Fax:  [phone]  Name: Carly [name] MRN: [id number] Date of Birth: Nov 12, 1950

## 2016-01-03 ENCOUNTER — Ambulatory Visit: Payer: Medicare Other

## 2016-01-07 ENCOUNTER — Ambulatory Visit (INDEPENDENT_AMBULATORY_CARE_PROVIDER_SITE_OTHER): Payer: Medicare Other | Admitting: Internal Medicine

## 2016-01-07 ENCOUNTER — Encounter: Payer: Self-pay | Admitting: Internal Medicine

## 2016-01-07 ENCOUNTER — Ambulatory Visit: Payer: Medicare Other

## 2016-01-07 VITALS — BP 120/80 | HR 88 | Temp 98.3°F | Resp 18 | Ht 63.0 in | Wt 169.0 lb

## 2016-01-07 DIAGNOSIS — F329 Major depressive disorder, single episode, unspecified: Secondary | ICD-10-CM | POA: Diagnosis not present

## 2016-01-07 DIAGNOSIS — R2681 Unsteadiness on feet: Secondary | ICD-10-CM | POA: Diagnosis not present

## 2016-01-07 DIAGNOSIS — G35 Multiple sclerosis: Secondary | ICD-10-CM

## 2016-01-07 DIAGNOSIS — E785 Hyperlipidemia, unspecified: Secondary | ICD-10-CM | POA: Diagnosis not present

## 2016-01-07 DIAGNOSIS — R600 Localized edema: Secondary | ICD-10-CM

## 2016-01-07 DIAGNOSIS — F32A Depression, unspecified: Secondary | ICD-10-CM

## 2016-01-07 NOTE — Progress Notes (Signed)
Pre-visit discussion using our clinic review tool. No additional management support is needed unless otherwise documented below in the visit note.  

## 2016-01-09 ENCOUNTER — Ambulatory Visit: Payer: Medicare Other

## 2016-01-13 ENCOUNTER — Encounter: Payer: Self-pay | Admitting: Internal Medicine

## 2016-01-13 DIAGNOSIS — R600 Localized edema: Secondary | ICD-10-CM | POA: Insufficient documentation

## 2016-01-13 NOTE — Assessment & Plan Note (Signed)
Low cholesterol diet and exercise.  Follow lipid panel.   

## 2016-01-13 NOTE — Progress Notes (Signed)
Patient ID: Carly [name], female   DOB: 01/19/1951, 65 y.o.   MRN: [id number]   Subjective:    Patient ID: Carly [name], female    DOB: October 01, 1951, 65 y.o.   MRN: [id number]  HPI  Patient here for a scheduled follow up.  She is concerned that the abilify is causing weight gain and swelling.  Felt swelling was worse with 5mg  of abilify.  She spoke to Dr Nicolasa Ducking.  She decreased abilify back down to 2.5mg .  Describes lower extremity swelling.  Worse as the day progresses.  No chest pain.  No sob.  No acid reflux.  No abdominal pain or cramping.  Bowels stable.  Increased anxiety.  Has been doing better.    Past Medical History  Diagnosis Date  . Depression   . History of chicken pox   . Frequent headaches     H/O  . Allergy   . Hx of migraines   . History of colon polyps   . Multiple sclerosis ([place])   . Diabetes mellitus without complication [facility])    Past Surgical History  Procedure Laterality Date  . Gallbladder surgery  2008  . Spine surgery  2014  . Foot surgery  2015   Family History  Problem Relation Age of Onset  . Arthritis Mother   . Hypertension Mother   . Hypertension Father   . Hyperlipidemia Father   . Heart disease Maternal Grandfather   . Diabetes Maternal Grandfather   . Kidney disease Paternal Grandmother    Social History   Social History  . Marital Status: Married    Spouse Name: N/A  . Number of Children: N/A  . Years of Education: N/A   Social History Main Topics  . Smoking status: Never Smoker   . Smokeless tobacco: Never Used  . Alcohol Use: No  . Drug Use: No  . Sexual Activity: Not Asked   Other Topics Concern  . None   Social History Narrative    Outpatient Encounter Prescriptions as of 01/07/2016  Medication Sig  . acetaminophen (TYLENOL) 325 MG tablet Take 650 mg by mouth every 4 (four) hours as needed.  . ARIPiprazole (ABILIFY) 5 MG tablet Take 2.5 mg by mouth daily.  . B Complex Vitamins (VITAMIN B COMPLEX PO) Take by  mouth.  Marland Kitchen CALCIUM PO Take 400 mg by mouth daily.  . Calcium-Phosphorus-Vitamin D (CITRACAL +D3 PO) Take by mouth. 2 tablets twice a day  . Cholecalciferol (D3-1000 PO) Take by mouth.  . Coenzyme Q10 (CO Q10) 100 MG CAPS Take by mouth.  . dalfampridine 10 MG TB12 Take 10 mg by mouth every 12 (twelve) hours.  . DULoxetine (CYMBALTA) 30 MG capsule Take 60 mg by mouth daily.  Marland Kitchen gabapentin (NEURONTIN) 100 MG capsule 2 (two) times daily.  . Multiple Vitamins-Minerals (CENTRUM SILVER ULTRA WOMENS PO) Take by mouth.  . natalizumab (TYSABRI) 300 MG/15ML injection Inject into the vein.  . progesterone (PROMETRIUM) 100 MG capsule Take 100 mg by mouth daily.  . pseudoephedrine (SUDAFED) 30 MG tablet Take 30 mg by mouth every 6 (six) hours as needed for congestion.  . sertraline (ZOLOFT) 100 MG tablet Take 200 mg by mouth daily.  . valACYclovir (VALTREX) 500 MG tablet Take 500 mg by mouth 2 (two) times daily.  . vitamin B-12 (CYANOCOBALAMIN) 1000 MCG tablet Take 1,000 mcg by mouth daily.   No facility-administered encounter medications on file as of 01/07/2016.    Review of Systems  Constitutional: Negative for  appetite change and unexpected weight change.  HENT: Negative for congestion and sinus pressure.   Respiratory: Negative for cough, chest tightness and shortness of breath.   Cardiovascular: Positive for leg swelling. Negative for chest pain and palpitations.  Gastrointestinal: Negative for nausea, vomiting, abdominal pain and diarrhea.  Genitourinary: Negative for dysuria and difficulty urinating.  Musculoskeletal: Negative for back pain and joint swelling.  Skin: Negative for color change and rash.  Neurological: Negative for dizziness, light-headedness and headaches.  Psychiatric/Behavioral: Negative for agitation.       Increased anxiety.         Objective:    Physical Exam  Constitutional: She appears well-developed and well-nourished. No distress.  HENT:  Nose: Nose normal.    Mouth/Throat: Oropharynx is clear and moist.  Neck: Neck supple. No thyromegaly present.  Cardiovascular: Normal rate and regular rhythm.   Pulmonary/Chest: Breath sounds normal. No respiratory distress. She has no wheezes.  Abdominal: Soft. Bowel sounds are normal. There is no tenderness.  Musculoskeletal: She exhibits no edema or tenderness.  Lymphadenopathy:    She has no cervical adenopathy.  Skin: No rash noted. No erythema.  Psychiatric: She has a normal mood and affect. Her behavior is normal.    BP 120/80 mmHg  Pulse 88  Temp(Src) 98.3 F (36.8 C) (Oral)  Resp 18  Ht 5\' 3"  (1.6 m)  Wt 169 lb (76.658 kg)  BMI 29.94 kg/m2  SpO2 96% Wt Readings from Last 3 Encounters:  01/07/16 169 lb (76.658 kg)  11/23/15 167 lb 4 oz (75.864 kg)  04/11/15 164 lb (74.39 kg)     Lab Results  Component Value Date   WBC 7.6 12/20/2015   HGB 13.6 12/20/2015   HCT 39.7 12/20/2015   PLT 285.0 12/20/2015   GLUCOSE 104* 12/20/2015   CHOL 259* 12/20/2015   TRIG 205.0* 12/20/2015   HDL 64.30 12/20/2015   LDLDIRECT 138.0 12/20/2015   LDLCALC 96 06/22/2015   ALT 27 12/20/2015   AST 23 12/20/2015   NA 143 12/20/2015   K 4.6 12/20/2015   CL 104 12/20/2015   CREATININE 0.69 12/20/2015   BUN 9 12/20/2015   CO2 29 12/20/2015   TSH 1.31 12/20/2015   HGBA1C 5.8 12/20/2015       Assessment & Plan:   Problem List Items Addressed This Visit    Depression - Primary    Followed by Dr Nicolasa Ducking.  Still with increased anxiety.  Concerned about abilify contributing to swelling.  She plans to discuss with Dr Nicolasa Ducking.        HLD (hyperlipidemia)    Low cholesterol diet and exercise.  Follow lipid panel.        Lower extremity edema    No significant increased swelling noted on exam today.  Minimal pedal edema.  Compression hose.  Avoid increased salt intake.  Leg elevation.        Multiple sclerosis ([place])    Seeing Dr Mike Gip.  See her note for details.  Overall stable.        Unsteady  gait    Undergoing therapy.  Worse with anxiety.  Follow.            Einar Pheasant, MD

## 2016-01-13 NOTE — Assessment & Plan Note (Signed)
Seeing Dr Mike Gip.  See her note for details.  Overall stable.

## 2016-01-13 NOTE — Assessment & Plan Note (Signed)
No significant increased swelling noted on exam today.  Minimal pedal edema.  Compression hose.  Avoid increased salt intake.  Leg elevation.

## 2016-01-13 NOTE — Assessment & Plan Note (Signed)
Undergoing therapy.  Worse with anxiety.  Follow.

## 2016-01-13 NOTE — Assessment & Plan Note (Signed)
Followed by Dr Nicolasa Ducking.  Still with increased anxiety.  Concerned about abilify contributing to swelling.  She plans to discuss with Dr Nicolasa Ducking.

## 2016-01-14 ENCOUNTER — Ambulatory Visit: Payer: Medicare Other

## 2016-01-16 ENCOUNTER — Inpatient Hospital Stay: Payer: Medicare Other | Attending: Hematology and Oncology

## 2016-01-16 ENCOUNTER — Inpatient Hospital Stay: Payer: Medicare Other

## 2016-01-16 VITALS — BP 134/70 | HR 80 | Temp 98.6°F | Resp 18

## 2016-01-16 DIAGNOSIS — G35 Multiple sclerosis: Secondary | ICD-10-CM | POA: Insufficient documentation

## 2016-01-16 DIAGNOSIS — Z79899 Other long term (current) drug therapy: Secondary | ICD-10-CM | POA: Diagnosis not present

## 2016-01-16 MED ORDER — SODIUM CHLORIDE 0.9 % IV SOLN: 300.0000 mg | Freq: Once | INTRAVENOUS | Status: DC

## 2016-01-16 MED ORDER — SODIUM CHLORIDE 0.9 % IV SOLN
Freq: Once | INTRAVENOUS | Status: AC
  Administered 2016-01-16: 11:00:00 via INTRAVENOUS
  Filled 2016-01-16: qty 1000

## 2016-01-16 MED ORDER — ACETAMINOPHEN 500 MG PO TABS
1000.0000 mg | ORAL_TABLET | Freq: Once | ORAL | Status: DC
  Filled 2016-01-16: qty 2

## 2016-01-16 MED ORDER — SODIUM CHLORIDE 0.9 % IV SOLN
300.0000 mg | Freq: Once | INTRAVENOUS | Status: AC
  Administered 2016-01-16: 300 mg via INTRAVENOUS
  Filled 2016-01-16: qty 15

## 2016-01-17 ENCOUNTER — Ambulatory Visit: Payer: Medicare Other

## 2016-01-21 ENCOUNTER — Ambulatory Visit: Payer: Medicare Other

## 2016-01-23 ENCOUNTER — Ambulatory Visit: Payer: Medicare Other

## 2016-01-28 ENCOUNTER — Inpatient Hospital Stay: Payer: Medicare Other

## 2016-02-11 ENCOUNTER — Ambulatory Visit: Payer: Medicare Other

## 2016-02-12 ENCOUNTER — Encounter (HOSPITAL_BASED_OUTPATIENT_CLINIC_OR_DEPARTMENT_OTHER): Payer: Self-pay | Admitting: *Deleted

## 2016-02-13 ENCOUNTER — Other Ambulatory Visit: Payer: Self-pay | Admitting: Orthopedic Surgery

## 2016-02-13 ENCOUNTER — Inpatient Hospital Stay: Payer: Medicare Other | Attending: Hematology and Oncology

## 2016-02-13 VITALS — BP 123/76 | HR 73 | Temp 97.7°F | Resp 20

## 2016-02-13 DIAGNOSIS — G35 Multiple sclerosis: Secondary | ICD-10-CM | POA: Diagnosis not present

## 2016-02-13 DIAGNOSIS — Z79899 Other long term (current) drug therapy: Secondary | ICD-10-CM | POA: Diagnosis not present

## 2016-02-13 MED ORDER — SODIUM CHLORIDE 0.9 % IV SOLN: 300.0000 mg | Freq: Once | INTRAVENOUS | Status: DC

## 2016-02-13 MED ORDER — SODIUM CHLORIDE 0.9 % IV SOLN
Freq: Once | INTRAVENOUS | Status: AC
  Administered 2016-02-13: 12:00:00 via INTRAVENOUS
  Filled 2016-02-13: qty 1000

## 2016-02-13 MED ORDER — NATALIZUMAB 300 MG/15ML IV CONC
300.0000 mg | Freq: Once | INTRAVENOUS | Status: AC
  Administered 2016-02-13: 300 mg via INTRAVENOUS
  Filled 2016-02-13: qty 15

## 2016-02-14 ENCOUNTER — Ambulatory Visit (HOSPITAL_BASED_OUTPATIENT_CLINIC_OR_DEPARTMENT_OTHER)
Admission: RE | Admit: 2016-02-14 | Discharge: 2016-02-14 | Disposition: A | Discharge status: Home / Self Care | Payer: Medicare Other | Source: Ambulatory Visit | Attending: Orthopedic Surgery | Admitting: Orthopedic Surgery

## 2016-02-14 ENCOUNTER — Encounter (HOSPITAL_BASED_OUTPATIENT_CLINIC_OR_DEPARTMENT_OTHER)
Admission: RE | Disposition: A | Discharge status: Home / Self Care | Payer: Self-pay | Source: Ambulatory Visit | Attending: Orthopedic Surgery

## 2016-02-14 ENCOUNTER — Encounter (HOSPITAL_BASED_OUTPATIENT_CLINIC_OR_DEPARTMENT_OTHER): Payer: Self-pay | Admitting: Anesthesiology

## 2016-02-14 ENCOUNTER — Ambulatory Visit (HOSPITAL_BASED_OUTPATIENT_CLINIC_OR_DEPARTMENT_OTHER): Payer: Medicare Other | Admitting: Certified Registered"

## 2016-02-14 DIAGNOSIS — Z880 Allergy status to penicillin: Secondary | ICD-10-CM | POA: Insufficient documentation

## 2016-02-14 DIAGNOSIS — M79672 Pain in left foot: Secondary | ICD-10-CM | POA: Insufficient documentation

## 2016-02-14 DIAGNOSIS — T8484XA Pain due to internal orthopedic prosthetic devices, implants and grafts, initial encounter: Secondary | ICD-10-CM | POA: Diagnosis not present

## 2016-02-14 DIAGNOSIS — Z9889 Other specified postprocedural states: Secondary | ICD-10-CM

## 2016-02-14 DIAGNOSIS — G35 Multiple sclerosis: Secondary | ICD-10-CM | POA: Insufficient documentation

## 2016-02-14 DIAGNOSIS — Y838 Other surgical procedures as the cause of abnormal reaction of the patient, or of later complication, without mention of misadventure at the time of the procedure: Secondary | ICD-10-CM | POA: Diagnosis not present

## 2016-02-14 HISTORY — DX: Other specified postprocedural states: R11.2

## 2016-02-14 HISTORY — PX: HARDWARE REMOVAL: SHX979

## 2016-02-14 HISTORY — DX: Other specified postprocedural states: Z98.890

## 2016-02-14 SURGERY — REMOVAL, HARDWARE
Anesthesia: General | Site: Foot | Laterality: Left

## 2016-02-14 MED ORDER — VANCOMYCIN HCL IN DEXTROSE 1-5 GM/200ML-% IV SOLN
INTRAVENOUS | Status: AC
  Filled 2016-02-14: qty 200

## 2016-02-14 MED ORDER — SCOPOLAMINE 1 MG/3DAYS TD PT72: 1.0000 | MEDICATED_PATCH | Freq: Once | TRANSDERMAL | Status: DC | PRN

## 2016-02-14 MED ORDER — GLYCOPYRROLATE 0.2 MG/ML IJ SOLN: 0.2000 mg | Freq: Once | INTRAMUSCULAR | Status: DC | PRN

## 2016-02-14 MED ORDER — LACTATED RINGERS IV SOLN: INTRAVENOUS | Status: DC

## 2016-02-14 MED ORDER — BUPIVACAINE-EPINEPHRINE 0.5% -1:200000 IJ SOLN
INTRAMUSCULAR | Status: DC | PRN
  Administered 2016-02-14: 1.5 mL

## 2016-02-14 MED ORDER — VANCOMYCIN HCL IN DEXTROSE 1-5 GM/200ML-% IV SOLN
1000.0000 mg | INTRAVENOUS | Status: AC
  Administered 2016-02-14: 1000 mg via INTRAVENOUS

## 2016-02-14 MED ORDER — PROPOFOL 10 MG/ML IV BOLUS
INTRAVENOUS | Status: AC
  Filled 2016-02-14: qty 20

## 2016-02-14 MED ORDER — 0.9 % SODIUM CHLORIDE (POUR BTL) OPTIME
TOPICAL | Status: DC | PRN
  Administered 2016-02-14: 120 mL

## 2016-02-14 MED ORDER — LIDOCAINE 2% (20 MG/ML) 5 ML SYRINGE
INTRAMUSCULAR | Status: AC
  Filled 2016-02-14: qty 5

## 2016-02-14 MED ORDER — ONDANSETRON HCL 4 MG/2ML IJ SOLN
INTRAMUSCULAR | Status: AC
  Filled 2016-02-14: qty 2

## 2016-02-14 MED ORDER — MIDAZOLAM HCL 2 MG/2ML IJ SOLN
INTRAMUSCULAR | Status: AC
  Filled 2016-02-14: qty 2

## 2016-02-14 MED ORDER — MIDAZOLAM HCL 2 MG/2ML IJ SOLN
1.0000 mg | INTRAMUSCULAR | Status: DC | PRN
  Administered 2016-02-14: 1 mg via INTRAVENOUS

## 2016-02-14 MED ORDER — FENTANYL CITRATE (PF) 100 MCG/2ML IJ SOLN: 25.0000 ug | INTRAMUSCULAR | Status: DC | PRN

## 2016-02-14 MED ORDER — FENTANYL CITRATE (PF) 100 MCG/2ML IJ SOLN
50.0000 ug | INTRAMUSCULAR | Status: AC | PRN
Start: 2016-02-14 — End: 2016-02-14
  Administered 2016-02-14: 50 ug via INTRAVENOUS
  Administered 2016-02-14 (×2): 25 ug via INTRAVENOUS

## 2016-02-14 MED ORDER — CHLORHEXIDINE GLUCONATE 4 % EX LIQD: 60.0000 mL | Freq: Once | CUTANEOUS | Status: DC

## 2016-02-14 MED ORDER — BUPIVACAINE-EPINEPHRINE (PF) 0.5% -1:200000 IJ SOLN
INTRAMUSCULAR | Status: AC
  Filled 2016-02-14: qty 30

## 2016-02-14 MED ORDER — PROPOFOL 10 MG/ML IV BOLUS
INTRAVENOUS | Status: DC | PRN
  Administered 2016-02-14 (×2): 20 mg via INTRAVENOUS
  Administered 2016-02-14: 50 mg via INTRAVENOUS

## 2016-02-14 MED ORDER — SODIUM CHLORIDE 0.9 % IV SOLN
INTRAVENOUS | Status: DC
Start: 2016-02-14 — End: 2016-02-14

## 2016-02-14 MED ORDER — LACTATED RINGERS IV SOLN
INTRAVENOUS | Status: DC
  Administered 2016-02-14: 11:00:00 via INTRAVENOUS

## 2016-02-14 MED ORDER — FENTANYL CITRATE (PF) 100 MCG/2ML IJ SOLN
INTRAMUSCULAR | Status: AC
  Filled 2016-02-14: qty 2

## 2016-02-14 MED ORDER — LIDOCAINE HCL (CARDIAC) 20 MG/ML IV SOLN
INTRAVENOUS | Status: DC | PRN
  Administered 2016-02-14: 50 mg via INTRAVENOUS

## 2016-02-14 SURGICAL SUPPLY — 69 items
APL SKNCLS STERI-STRIP NONHPOA (GAUZE/BANDAGES/DRESSINGS)
BANDAGE ACE 4X5 VEL STRL LF (GAUZE/BANDAGES/DRESSINGS) IMPLANT
BANDAGE ESMARK 6X9 LF (GAUZE/BANDAGES/DRESSINGS) IMPLANT
BENZOIN TINCTURE PRP APPL 2/3 (GAUZE/BANDAGES/DRESSINGS) IMPLANT
BLADE SURG 15 STRL LF DISP TIS (BLADE) ×2 IMPLANT
BLADE SURG 15 STRL SS (BLADE) ×4
BNDG CMPR 9X4 STRL LF SNTH (GAUZE/BANDAGES/DRESSINGS) ×1
BNDG CMPR 9X6 STRL LF SNTH (GAUZE/BANDAGES/DRESSINGS)
BNDG COHESIVE 4X5 TAN STRL (GAUZE/BANDAGES/DRESSINGS) ×2 IMPLANT
BNDG COHESIVE 6X5 TAN STRL LF (GAUZE/BANDAGES/DRESSINGS) IMPLANT
BNDG CONFORM 2 STRL LF (GAUZE/BANDAGES/DRESSINGS) ×2 IMPLANT
BNDG ESMARK 4X9 LF (GAUZE/BANDAGES/DRESSINGS) ×2 IMPLANT
BNDG ESMARK 6X9 LF (GAUZE/BANDAGES/DRESSINGS)
CHLORAPREP W/TINT 26ML (MISCELLANEOUS) ×2 IMPLANT
COVER BACK TABLE 60X90IN (DRAPES) ×2 IMPLANT
CUFF TOURNIQUET SINGLE 34IN LL (TOURNIQUET CUFF) IMPLANT
DECANTER SPIKE VIAL GLASS SM (MISCELLANEOUS) IMPLANT
DRAPE EXTREMITY T 121X128X90 (DRAPE) ×2 IMPLANT
DRAPE OEC MINIVIEW 54X84 (DRAPES) ×2 IMPLANT
DRAPE SURG 17X23 STRL (DRAPES) IMPLANT
DRAPE U-SHAPE 47X51 STRL (DRAPES) ×2 IMPLANT
DRSG MEPITEL 4X7.2 (GAUZE/BANDAGES/DRESSINGS) ×2 IMPLANT
DRSG PAD ABDOMINAL 8X10 ST (GAUZE/BANDAGES/DRESSINGS) IMPLANT
ELECT REM PT RETURN 9FT ADLT (ELECTROSURGICAL) ×2
ELECTRODE REM PT RTRN 9FT ADLT (ELECTROSURGICAL) ×1 IMPLANT
GAUZE SPONGE 4X4 12PLY STRL (GAUZE/BANDAGES/DRESSINGS) ×2 IMPLANT
GLOVE BIO SURGEON STRL SZ8 (GLOVE) ×2 IMPLANT
GLOVE BIOGEL M STRL SZ7.5 (GLOVE) ×2 IMPLANT
GLOVE BIOGEL PI IND STRL 7.0 (GLOVE) ×1 IMPLANT
GLOVE BIOGEL PI IND STRL 8 (GLOVE) ×3 IMPLANT
GLOVE BIOGEL PI INDICATOR 7.0 (GLOVE) ×1
GLOVE BIOGEL PI INDICATOR 8 (GLOVE) ×3
GLOVE ECLIPSE 6.5 STRL STRAW (GLOVE) ×2 IMPLANT
GLOVE ECLIPSE 7.5 STRL STRAW (GLOVE) ×2 IMPLANT
GLOVE EXAM NITRILE MD LF STRL (GLOVE) ×2 IMPLANT
GOWN STRL REUS W/ TWL LRG LVL3 (GOWN DISPOSABLE) ×1 IMPLANT
GOWN STRL REUS W/ TWL XL LVL3 (GOWN DISPOSABLE) ×3 IMPLANT
GOWN STRL REUS W/TWL LRG LVL3 (GOWN DISPOSABLE) ×2
GOWN STRL REUS W/TWL XL LVL3 (GOWN DISPOSABLE) ×6
NEEDLE HYPO 22GX1.5 SAFETY (NEEDLE) IMPLANT
NEEDLE HYPO 25X1 1.5 SAFETY (NEEDLE) ×2 IMPLANT
PACK BASIN DAY SURGERY FS (CUSTOM PROCEDURE TRAY) ×2 IMPLANT
PAD CAST 4YDX4 CTTN HI CHSV (CAST SUPPLIES) ×1 IMPLANT
PADDING CAST ABS 4INX4YD NS (CAST SUPPLIES)
PADDING CAST ABS COTTON 4X4 ST (CAST SUPPLIES) IMPLANT
PADDING CAST COTTON 4X4 STRL (CAST SUPPLIES) ×2
PADDING CAST COTTON 6X4 STRL (CAST SUPPLIES) IMPLANT
PENCIL BUTTON HOLSTER BLD 10FT (ELECTRODE) ×2 IMPLANT
SANITIZER HAND PURELL 535ML FO (MISCELLANEOUS) ×2 IMPLANT
SHEET MEDIUM DRAPE 40X70 STRL (DRAPES) ×2 IMPLANT
SLEEVE SCD COMPRESS KNEE MED (MISCELLANEOUS) ×2 IMPLANT
SPLINT FAST PLASTER 5X30 (CAST SUPPLIES)
SPLINT PLASTER CAST FAST 5X30 (CAST SUPPLIES) IMPLANT
SPONGE LAP 18X18 X RAY DECT (DISPOSABLE) ×2 IMPLANT
STOCKINETTE 6  STRL (DRAPES) ×1
STOCKINETTE 6 STRL (DRAPES) ×1 IMPLANT
STRIP CLOSURE SKIN 1/2X4 (GAUZE/BANDAGES/DRESSINGS) IMPLANT
SUCTION FRAZIER HANDLE 10FR (MISCELLANEOUS)
SUCTION TUBE FRAZIER 10FR DISP (MISCELLANEOUS) IMPLANT
SUT ETHILON 3 0 PS 1 (SUTURE) ×2 IMPLANT
SUT MNCRL AB 3-0 PS2 18 (SUTURE) ×2 IMPLANT
SUT VIC AB 0 SH 27 (SUTURE) IMPLANT
SUT VIC AB 2-0 SH 27 (SUTURE)
SUT VIC AB 2-0 SH 27XBRD (SUTURE) IMPLANT
SYR BULB 3OZ (MISCELLANEOUS) ×2 IMPLANT
SYR CONTROL 10ML LL (SYRINGE) ×2 IMPLANT
TOWEL OR 17X24 6PK STRL BLUE (TOWEL DISPOSABLE) ×4 IMPLANT
TUBE CONNECTING 20X1/4 (TUBING) IMPLANT
UNDERPAD 30X30 (UNDERPADS AND DIAPERS) ×2 IMPLANT

## 2016-02-14 NOTE — H&P (Signed)
Carly [name] is an 65 y.o. female.   Chief Complaint: left hallux pain HPI: 65 y/o female with left hallux pain from hardware after IP joint fusion in 2015.  SHe presents today for removal of the deep implant.  Past Medical History  Diagnosis Date  . Depression   . History of chicken pox   . Frequent headaches     H/O  . Allergy   . Hx of migraines   . History of colon polyps   . Multiple sclerosis ([place])   . PONV (postoperative nausea and vomiting)     Past Surgical History  Procedure Laterality Date  . Gallbladder surgery  2008  . Spine surgery  2014  . Foot surgery  2015    Family History  Problem Relation Age of Onset  . Arthritis Mother   . Hypertension Mother   . Hypertension Father   . Hyperlipidemia Father   . Heart disease Maternal Grandfather   . Diabetes Maternal Grandfather   . Kidney disease Paternal Grandmother    Social History:  reports that she has never smoked. She has never used smokeless tobacco. She reports that she does not drink alcohol or use illicit drugs.  Allergies:  Allergies  Allergen Reactions  . Asa [Aspirin] Swelling  . Buspirone Other (See Comments)    Headaches    . Levofloxacin Other (See Comments)    "weakness"  . Lexapro [Escitalopram Oxalate] Other (See Comments)    Weakness   . Motrin [Ibuprofen] Swelling  . Ceftin [Cefuroxime Axetil] Rash  . Penicillins Rash  . Sulfa Antibiotics Rash    Medications Prior to Admission  Medication Sig Dispense Refill  . acetaminophen (TYLENOL) 325 MG tablet Take 650 mg by mouth every 4 (four) hours as needed.    . ARIPiprazole (ABILIFY) 5 MG tablet Take 2.5 mg by mouth daily.  3  . B Complex Vitamins (VITAMIN B COMPLEX PO) Take by mouth.    Marland Kitchen CALCIUM PO Take 400 mg by mouth daily.    . Calcium-Phosphorus-Vitamin D (CITRACAL +D3 PO) Take by mouth. 2 tablets twice a day    . Cholecalciferol (D3-1000 PO) Take by mouth.    . Coenzyme Q10 (CO Q10) 100 MG CAPS Take by mouth.    .  dalfampridine 10 MG TB12 Take 10 mg by mouth every 12 (twelve) hours.    . DULoxetine (CYMBALTA) 30 MG capsule Take 60 mg by mouth daily.  0  . gabapentin (NEURONTIN) 100 MG capsule 2 (two) times daily.    . Multiple Vitamins-Minerals (CENTRUM SILVER ULTRA WOMENS PO) Take by mouth.    . natalizumab (TYSABRI) 300 MG/15ML injection Inject into the vein.    . pseudoephedrine (SUDAFED) 30 MG tablet Take 30 mg by mouth every 6 (six) hours as needed for congestion.    . sertraline (ZOLOFT) 100 MG tablet Take 200 mg by mouth daily.    . vitamin B-12 (CYANOCOBALAMIN) 1000 MCG tablet Take 1,000 mcg by mouth daily.      No results found for this or any previous visit (from the past 48 hour(s)). No results found.  ROS  No recent f/c/n/v/wt loss.  Blood pressure 141/74, pulse 90, temperature 98.2 F (36.8 C), temperature source Oral, resp. rate 18, height 5\' 3"  (1.6 m), weight 74.844 kg (165 lb), SpO2 97 %. Physical Exam  wn wd woman in nad.  A and O x 4.  Mood and affect normal.  EOMI.  resp unlabored.  L hallux with healed  surgical incisions.  No signs of infection.  Brisk cap refill at the toes.  No lymphadenopathy.  5/5 strength in PF and DF of the ankle.  Assessment/Plan L hallux painful hardware - to OR for removal of deep implants.  The risks and benefits of the alternative treatment options have been discussed in detail.  The patient wishes to proceed with surgery and specifically understands risks of bleeding, infection, nerve damage, blood clots, need for additional surgery, amputation and death.   [name], MD 02/28/2016, 12:20 PM

## 2016-02-14 NOTE — Anesthesia Preprocedure Evaluation (Addendum)
Anesthesia Evaluation  Patient identified by MRN, date of birth, ID band Patient awake    Reviewed: Allergy & Precautions, H&P , NPO status , Patient's Chart, lab work & pertinent test results  History of Anesthesia Complications (+) PONV  Airway Mallampati: II  TM Distance: >3 FB Neck ROM: full    Dental no notable dental hx. (+) Dental Advisory Given, Teeth Intact   Pulmonary neg pulmonary ROS,    Pulmonary exam normal breath sounds clear to auscultation       Cardiovascular Exercise Tolerance: Good negative cardio ROS Normal cardiovascular exam Rhythm:regular Rate:Normal     Neuro/Psych  Headaches, Depression Multiple sclerosis negative psych ROS   GI/Hepatic negative GI ROS, Neg liver ROS,   Endo/Other  negative endocrine ROS  Renal/GU negative Renal ROS  negative genitourinary   Musculoskeletal   Abdominal   Peds  Hematology negative hematology ROS (+)   Anesthesia Other Findings   Reproductive/Obstetrics negative OB ROS                             Anesthesia Physical Anesthesia Plan  ASA: III  Anesthesia Plan: MAC   Post-op Pain Management:    Induction:   Airway Management Planned:   Additional Equipment:   Intra-op Plan:   Post-operative Plan:   Informed Consent: I have reviewed the patients History and Physical, chart, labs and discussed the procedure including the risks, benefits and alternatives for the proposed anesthesia with the patient or authorized representative who has indicated his/her understanding and acceptance.   Dental Advisory Given  Plan Discussed with: CRNA and Surgeon  Anesthesia Plan Comments:        Anesthesia Quick Evaluation

## 2016-02-14 NOTE — Transfer of Care (Signed)
Immediate Anesthesia Transfer of Care Note  Patient: Carly [name]  Procedure(s) Performed: Procedure(s): LEFT FOOT REMOVAL DEEP IMPLANT (Left)  Patient Location: PACU  Anesthesia Type:MAC  Level of Consciousness: awake, sedated and patient cooperative  Airway & Oxygen Therapy: Patient Spontanous Breathing and Patient connected to face mask oxygen  Post-op Assessment: Report given to RN and Post -op Vital signs reviewed and stable  Post vital signs: Reviewed and stable  Last Vitals:  Filed Vitals:   02/14/16 1050  BP: 141/74  Pulse: 90  Temp: 36.8 C  Resp: 18    Last Pain:  Filed Vitals:   02/14/16 1053  PainSc: 6       Patients Stated Pain Goal: 3 ([id number])  Complications: No apparent anesthesia complications

## 2016-02-14 NOTE — Discharge Instructions (Addendum)
Carly Simmer, MD [place]  Please read the following information regarding your care after surgery.  Medications  You only need a prescription for the narcotic pain medicine (ex. oxycodone, Percocet, Norco).  All of the other medicines listed below are available over the counter. X acetominophen (Tylenol) 650 mg every 4-6 hours as you need for minor pain  Weight Bearing X Bear weight when you are able on your operated leg or foot.   Cast / Splint / Dressing X Remove your dressing 3 days after surgery and cover the incisions with dry dressings.    After your dressing, cast or splint is removed; you may shower, but do not soak or scrub the wound.  Allow the water to run over it, and then gently pat it dry.  Swelling It is normal for you to have swelling where you had surgery.  To reduce swelling and pain, keep your toes above your nose for at least 3 days after surgery.  It may be necessary to keep your foot or leg elevated for several weeks.  If it hurts, it should be elevated.  Follow Up Call my office at [phone] when you are discharged from the hospital or surgery center to schedule an appointment to be seen two weeks after surgery.  Call my office at [phone] if you develop a fever >101.5 F, nausea, vomiting, bleeding from the surgical site or severe pain.     Post Anesthesia Home Care Instructions  Activity: Get plenty of rest for the remainder of the day. A responsible adult should stay with you for 24 hours following the procedure.  For the next 24 hours, DO NOT: -Drive a car -Paediatric nurse -Drink alcoholic beverages -Take any medication unless instructed by your physician -Make any legal decisions or sign important papers.  Meals: Start with liquid foods such as gelatin or soup. Progress to regular foods as tolerated. Avoid greasy, spicy, heavy foods. If nausea and/or vomiting occur, drink only clear liquids until the nausea and/or vomiting  subsides. Call your physician if vomiting continues.  Special Instructions/Symptoms: Your throat may feel dry or sore from the anesthesia or the breathing tube placed in your throat during surgery. If this causes discomfort, gargle with warm salt water. The discomfort should disappear within 24 hours.  If you had a scopolamine patch placed behind your ear for the management of post- operative nausea and/or vomiting:  1. The medication in the patch is effective for 72 hours, after which it should be removed.  Wrap patch in a tissue and discard in the trash. Wash hands thoroughly with soap and water. 2. You may remove the patch earlier than 72 hours if you experience unpleasant side effects which may include dry mouth, dizziness or visual disturbances. 3. Avoid touching the patch. Wash your hands with soap and water after contact with the patch.

## 2016-02-14 NOTE — Op Note (Signed)
Carly [name], BAIRD           ACCOUNT NO.:  [id number]  MEDICAL RECORD NO.:  [id number]  LOCATION:                                 FACILITY:  PHYSICIAN:  [name], MD        DATE OF BIRTH:  11/01/1950  DATE OF PROCEDURE:  02/14/2016 DATE OF DISCHARGE:                              OPERATIVE REPORT   PREOPERATIVE DIAGNOSIS:  Painful hardware, left hallux.  POSTOPERATIVE DIAGNOSIS:  Painful hardware, left hallux.  PROCEDURE: 1. Left hallux removal of deep implant. 2. AP and lateral radiographs of the left foot.  SURGEON:  [name], MD.  ASSISTANT:  Mechele Claude, PA-C.  ANESTHESIA:  Local, MAC.  ESTIMATED BLOOD LOSS:  Minimal.  TOURNIQUET TIME:  Zero.  COMPLICATIONS:  None apparent.  DISPOSITION:  Extubated, awake, and stable to recovery.  INDICATION FOR PROCEDURE:  The patient is a 65 year old woman with past medical history significant for multiple sclerosis.  She underwent hallux IP joint arthrodesis in 2015.  She complains of pain at the hallux and desires removal of the screw from that toe.  She has failed nonoperative treatment to date including activity modification, oral anti-inflammatories, and shoe wear modification.  She understands the risks and benefits, the alternative treatment options, and elects surgical treatment.  She specifically understands risks of bleeding, infection, nerve damage, blood clots, need for additional surgery, amputation, and death.  PROCEDURE IN DETAIL:  After preoperative consent was obtained and the correct operative site was identified, the patient was brought to the operating room and placed supine on the operating table.  IV antibiotics were administered.  Surgical time-out was taken.  IV sedation was administered.  A digital block was then performed at the left hallux with 0.5% Marcaine with epinephrine.  The left lower extremity was prepped and draped in standard sterile fashion.  A guide pin was inserted at the  tip of the toe and the head of the screw was localized. A small transverse incision was made, and a screwdriver was used to remove the screw in its entirety.  After clearing the head of all soft tissues.  AP and lateral radiographs confirmed complete removal of the screw.  The wound was irrigated and closed with nylon.  Sterile dressings were applied.  The patient was awakened from anesthesia and transported to the recovery room in stable condition.  FOLLOWUP PLAN:  The patient will be weightbearing as tolerated on her left foot in a regular shoe.  She will follow up with me in the office in 2 weeks for suture removal.  RADIOGRAPHS:  AP and lateral radiographs of the left foot were obtained intraoperatively.  These show interval removal of the cannulated lag screw from the left hallux.  No other acute injuries are noted.     [name], MD     JH/MEDQ  D:  02/14/2016  T:  02/14/2016  Job:  [id number]

## 2016-02-14 NOTE — Anesthesia Postprocedure Evaluation (Signed)
Anesthesia Post Note  Patient: Carly [name]  Procedure(s) Performed: Procedure(s) (LRB): LEFT FOOT REMOVAL DEEP IMPLANT (Left)  Patient location during evaluation: PACU Anesthesia Type: General Level of consciousness: awake and alert Pain management: pain level controlled Vital Signs Assessment: post-procedure vital signs reviewed and stable Respiratory status: spontaneous breathing, nonlabored ventilation and respiratory function stable Cardiovascular status: blood pressure returned to baseline and stable Postop Assessment: no signs of nausea or vomiting Anesthetic complications: no Comments: Pt may have aspirated a small amount of gastric liquid during the case to cause a laryngospasm even without general anesthesia.  She was had a non productive cough in the PACU with a feeling of rattling at first.  Prior to discharge she had improved but still had a mild cough.  I didn't think obtaining a CXR would show anything that was not evident clinically.  She wanted to go home but was reminded to call us or her surgeon if she developed any fever or chills or if her respiratory symptoms increased in severity. Her husband understood and agreed with my plan.    Last Vitals:  Filed Vitals:   02/14/16 1430 02/14/16 1509  BP: 139/62 143/75  Pulse: 91 96  Temp:  37.3 C  Resp: 22 20    Last Pain:  Filed Vitals:   02/14/16 1531  PainSc: 0-No pain                 Mellony Danziger A

## 2016-02-14 NOTE — Brief Op Note (Signed)
02/14/2016  1:15 PM  PATIENT:  Carly [name]  65 y.o. female  PRE-OPERATIVE DIAGNOSIS:  LEFT HALLUX RETAINED HARDWARE  POST-OPERATIVE DIAGNOSIS:  same  Procedure(s): 1.  LEFT FOOT REMOVAL DEEP IMPLANT 2.  AP and lateral xrays of the left foot  SURGEON:  [name], MD  ASSISTANT: Mechele Claude, PA-C  ANESTHESIA:   Local, MAC  EBL:  minimal   TOURNIQUET:  none  COMPLICATIONS:  None apparent  DISPOSITION:  Extubated, awake and stable to recovery.  DICTATION ID:  [id number]

## 2016-02-17 ENCOUNTER — Encounter (HOSPITAL_BASED_OUTPATIENT_CLINIC_OR_DEPARTMENT_OTHER): Payer: Self-pay | Admitting: Orthopedic Surgery

## 2016-02-20 ENCOUNTER — Ambulatory Visit: Payer: Medicare Other

## 2016-02-25 ENCOUNTER — Inpatient Hospital Stay: Payer: Medicare Other

## 2016-03-04 ENCOUNTER — Other Ambulatory Visit: Payer: Self-pay | Admitting: Obstetrics and Gynecology

## 2016-03-04 ENCOUNTER — Other Ambulatory Visit: Payer: Self-pay | Admitting: Psychiatry

## 2016-03-04 DIAGNOSIS — G35 Multiple sclerosis: Secondary | ICD-10-CM

## 2016-03-04 DIAGNOSIS — Z1231 Encounter for screening mammogram for malignant neoplasm of breast: Secondary | ICD-10-CM

## 2016-03-06 ENCOUNTER — Other Ambulatory Visit: Payer: Self-pay | Admitting: Obstetrics and Gynecology

## 2016-03-06 ENCOUNTER — Ambulatory Visit
Admission: RE | Admit: 2016-03-06 | Discharge: 2016-03-06 | Disposition: A | Discharge status: Home / Self Care | Payer: Medicare Other | Source: Ambulatory Visit | Attending: Obstetrics and Gynecology | Admitting: Obstetrics and Gynecology

## 2016-03-06 DIAGNOSIS — Z1231 Encounter for screening mammogram for malignant neoplasm of breast: Secondary | ICD-10-CM

## 2016-03-10 ENCOUNTER — Ambulatory Visit: Payer: Medicare Other

## 2016-03-12 ENCOUNTER — Ambulatory Visit
Admission: RE | Admit: 2016-03-12 | Discharge: 2016-03-12 | Disposition: A | Discharge status: Home / Self Care | Payer: Medicare Other | Source: Ambulatory Visit | Attending: Psychiatry | Admitting: Psychiatry

## 2016-03-12 ENCOUNTER — Inpatient Hospital Stay: Payer: Medicare Other

## 2016-03-12 DIAGNOSIS — G35 Multiple sclerosis: Secondary | ICD-10-CM

## 2016-03-12 MED ORDER — GADOBENATE DIMEGLUMINE 529 MG/ML IV SOLN
10.0000 mL | Freq: Once | INTRAVENOUS | Status: AC | PRN
  Administered 2016-03-12: 10 mL via INTRAVENOUS

## 2016-03-14 ENCOUNTER — Ambulatory Visit: Payer: Medicare Other

## 2016-03-17 ENCOUNTER — Telehealth: Payer: Self-pay | Admitting: Internal Medicine

## 2016-03-17 NOTE — Telephone Encounter (Signed)
Pt called and was dx with shingles and wanted to know if she should still come in on 06/08?   Call pt @  [phone]. Thank you!

## 2016-03-18 ENCOUNTER — Telehealth: Payer: Self-pay | Admitting: Internal Medicine

## 2016-03-18 DIAGNOSIS — E2839 Other primary ovarian failure: Secondary | ICD-10-CM

## 2016-03-18 NOTE — Telephone Encounter (Signed)
I called pt and she cancelled her appt, pt stated her feet was feeling better but she isn't.

## 2016-03-18 NOTE — Telephone Encounter (Signed)
Pt called stating she needs to have a bone density test and wants to go to [facility] across from [facility]   Call pt @ [phone]. Thank you!

## 2016-03-18 NOTE — Telephone Encounter (Signed)
If she has been being treating it is fine to keep her appt. thanks

## 2016-03-18 NOTE — Telephone Encounter (Signed)
Per the chart last screening was in 2013.  Please advise for order? thanks

## 2016-03-19 ENCOUNTER — Ambulatory Visit: Payer: Medicare Other

## 2016-03-19 NOTE — Telephone Encounter (Signed)
Order placed for bone density.  See her phone message.  Please schedule.  Thanks

## 2016-03-20 ENCOUNTER — Ambulatory Visit: Payer: Medicare Other | Admitting: Internal Medicine

## 2016-03-24 ENCOUNTER — Inpatient Hospital Stay: Payer: Medicare Other

## 2016-03-31 ENCOUNTER — Telehealth: Payer: Self-pay | Admitting: Internal Medicine

## 2016-03-31 NOTE — Telephone Encounter (Signed)
Pt is getting over the shingles. She is suppose to get an infusion. She is wondering if she can have one. Please call her at home.

## 2016-03-31 NOTE — Telephone Encounter (Signed)
Patient came down with the shingles June 2.  Patients infusion is scheduled 30June17.  Patients states that the lesions have dried up and is having some residual pain. Is it safe to receive the infusion this soon. Please advise

## 2016-03-31 NOTE — Telephone Encounter (Signed)
She gets her infusions through oncology.  She needs to contact them to see if they recommend holding on infusion.  Tell her to let us know what they say and if any problems.

## 2016-04-01 NOTE — Telephone Encounter (Signed)
Pt.notified

## 2016-04-07 ENCOUNTER — Ambulatory Visit: Payer: Medicare Other

## 2016-04-09 ENCOUNTER — Inpatient Hospital Stay: Payer: Medicare Other | Attending: Hematology and Oncology | Admitting: Hematology and Oncology

## 2016-04-09 ENCOUNTER — Encounter: Payer: Self-pay | Admitting: Hematology and Oncology

## 2016-04-09 ENCOUNTER — Inpatient Hospital Stay: Payer: Medicare Other

## 2016-04-09 VITALS — BP 130/88 | HR 101 | Temp 97.2°F | Resp 17 | Ht 63.0 in | Wt 168.3 lb

## 2016-04-09 DIAGNOSIS — Z5112 Encounter for antineoplastic immunotherapy: Secondary | ICD-10-CM | POA: Insufficient documentation

## 2016-04-09 DIAGNOSIS — Z79899 Other long term (current) drug therapy: Secondary | ICD-10-CM | POA: Diagnosis not present

## 2016-04-09 DIAGNOSIS — G35 Multiple sclerosis: Secondary | ICD-10-CM

## 2016-04-09 MED ORDER — ACETAMINOPHEN 325 MG PO TABS: 650.0000 mg | ORAL_TABLET | Freq: Once | ORAL | Status: DC

## 2016-04-09 MED ORDER — METHYLPREDNISOLONE SODIUM SUCC 125 MG IJ SOLR
100.0000 mg | Freq: Once | INTRAMUSCULAR | Status: DC
  Filled 2016-04-09: qty 2

## 2016-04-09 MED ORDER — SODIUM CHLORIDE 0.9 % IV SOLN: 300.0000 mg | Freq: Once | INTRAVENOUS | Status: DC

## 2016-04-09 MED ORDER — DIPHENHYDRAMINE HCL 50 MG/ML IJ SOLN: 25.0000 mg | Freq: Once | INTRAMUSCULAR | Status: DC

## 2016-04-09 NOTE — Progress Notes (Addendum)
[facility] day:  04/09/2016  Chief Complaint: Carly [name] is a 65 y.o. female with relapsing multiple sclerosis who is seen for 6 month assessment prior to initiation of Ocrevus (ocrelizumb).  HPI:  The patient was last seen by me in the medical oncology clinic on 10/27/2015.  At that time, she was doing well.  She received Tysabri.  She has continued to receive Tysabri every month (last 02/13/2016).  JC virus testing was negative in 11/2015.  She notes getting the shingles on 06/702/2017. She was on Valtrex for 7 days.  She notes the decision to change to Ocrevus per Dr. Effie Shy.  She was last seen in clinic on 03/03/2016.  Labs on 03/03/2016 revealed a hematocrit of 42.3, hemoglobin 14.0, platelets 357,000, WBC 8200 with an ANC of 3800.  CD4 helper was 1612 with a 37.4%  CD4 positive lymphocytes.  CD4/CD8 ratio was 2.01 (0.92-3.72).  Hepatitis C virus antibody was 0.1 (negative).  Hepatitis B surface antigen screen was negative.  Hepatitis B surface antibody was negative.  Hepatitis B core antibody total and core IgM was negative.  CMP was normal with a creatinine of 0.68.  Liver function tests were normal.  Symptomatically, she denies any loss of function.   Past Medical History  Diagnosis Date  . Depression   . History of chicken pox   . Frequent headaches     H/O  . Allergy   . Hx of migraines   . History of colon polyps   . Multiple sclerosis ([place])   . PONV (postoperative nausea and vomiting)     Past Surgical History  Procedure Laterality Date  . Gallbladder surgery  2008  . Spine surgery  2014  . Foot surgery  2015  . Hardware removal Left 02/14/2016    Procedure: LEFT FOOT REMOVAL DEEP IMPLANT;  Surgeon: [name], MD;  Location: [facility];  Service: Orthopedics;  Laterality: Left;    Family History  Problem Relation Age of Onset  . Arthritis Mother   . Hypertension Mother   .  Hypertension Father   . Hyperlipidemia Father   . Heart disease Maternal Grandfather   . Diabetes Maternal Grandfather   . Kidney disease Paternal Grandmother     Social History:  reports that she has never smoked. She has never used smokeless tobacco. She reports that she does not drink alcohol or use illicit drugs.  The patient is accompanied by her husband, Abe People, today.  Allergies:  Allergies  Allergen Reactions  . Asa [Aspirin] Swelling  . Buspirone Other (See Comments)    Headaches    . Levofloxacin Other (See Comments)    "weakness"  . Lexapro [Escitalopram Oxalate] Other (See Comments)    Weakness   . Motrin [Ibuprofen] Swelling  . Ceftin [Cefuroxime Axetil] Rash  . Penicillins Rash  . Sulfa Antibiotics Rash    Current Medications: Current Outpatient Prescriptions  Medication Sig Dispense Refill  . acetaminophen (TYLENOL) 325 MG tablet Take 650 mg by mouth every 4 (four) hours as needed.    . ARIPiprazole (ABILIFY) 5 MG tablet Take 2.5 mg by mouth daily.  3  . B Complex Vitamins (VITAMIN B COMPLEX PO) Take by mouth.    Marland Kitchen CALCIUM PO Take 400 mg by mouth daily.    . Calcium-Phosphorus-Vitamin D (CITRACAL +D3 PO) Take by mouth. 2 tablets twice a day    . Cholecalciferol (D3-1000 PO) Take by mouth.    Marland Kitchen  Coenzyme Q10 (CO Q10) 100 MG CAPS Take by mouth.    . dalfampridine 10 MG TB12 Take 10 mg by mouth every 12 (twelve) hours.    . DULoxetine (CYMBALTA) 30 MG capsule Take 60 mg by mouth daily.  0  . fluticasone (FLONASE) 50 MCG/ACT nasal spray     . Multiple Vitamins-Minerals (CENTRUM SILVER ULTRA WOMENS PO) Take by mouth.    . pseudoephedrine (SUDAFED) 30 MG tablet Take 30 mg by mouth every 6 (six) hours as needed for congestion.    . sertraline (ZOLOFT) 100 MG tablet Take 200 mg by mouth daily.    . sodium chloride (V-R NASAL SPRAY SALINE) 0.65 % nasal spray Place into the nose.    . vitamin B-12 (CYANOCOBALAMIN) 1000 MCG tablet Take 1,000 mcg by mouth daily.    Marland Kitchen  ALPRAZolam (XANAX) 0.5 MG tablet Take by mouth. Reported on 04/09/2016     No current facility-administered medications for this visit.    Review of Systems:  GENERAL:  Weak and fatigued.  No fevers, sweats or weight loss. PERFORMANCE STATUS (ECOG):  2 HEENT:  Sinus headache.  No visual changes, runny nose, sore throat, mouth sores or tenderness. Lungs: No shortness of breath or cough.  No hemoptysis. Cardiac:  No chest pain, palpitations, orthopnea, or PND. Breasts:  Denies any lumps or skin changes.  Notes up to date on mammogram. GI:  No nausea, vomiting, diarrhea, constipation, melena or hematochezia.  Polyps.  Notes up to date on colonoscopy. GU:  No urgency, frequency, dysuria, or hematuria. Musculoskeletal:  Back problems due to scoliosis.  No joint pain.  No muscle tenderness. Extremities:  No pain or swelling. Skin:  No rashes or skin changes. Neuro:  General weakness and subsequent balance issues.  Ambulates with a cane.  No headache, numbness or focal weakness. Endocrine:  No diabetes, thyroid issues, hot flashes or night sweats. Psych:  Anxiety.  No depression. Pain:  No focal pain. Review of systems:  All other systems reviewed and found to be negative.   Physical Exam: Blood pressure 130/88, pulse 101, temperature 97.2 F (36.2 C), temperature source Tympanic, resp. rate 17, height 5\' 3"  (1.6 m), weight 168 lb 5.1 oz (76.35 kg). GENERAL:  Well developed, well nourished, sitting comfortably in a wheelchair the exam room  in no acute distress.  She needs a 2 prson assist onto the exam table. MENTAL STATUS:  Alert and oriented to person, place and time. HEAD:  Short styled brown hair.  Normocephalic, atraumatic, face symmetric, no Cushingoid features. EYES:  Brown eyes.  Pupils equal round and reactive to light and accomodation.  No conjunctivitis or scleral icterus. ENT:  Oropharynx clear without lesion.  Patchy tongue. Mucous membranes moist.  RESPIRATORY:  Clear to  auscultation without rales, wheezes or rhonchi. CARDIOVASCULAR:  Regular rate and rhythm without murmur, rub or gallop. ABDOMEN:  Soft, non-tender, with active bowel sounds, and no hepatosplenomegaly.  No masses. SKIN:  Right posterior lower thoracic scarring secondary to herpes zoster.  EXTREMITIES: Trace bilateral ankle edema.  No skin discoloration or tenderness.  No palpable cords. LYMPH NODES: No palpable cervical, supraclavicular, axillary or inguinal adenopathy  NEUROLOGICAL: Alert & oriented, cranial nerves II-XII intact; motor strength symmetric; sensation intact; finger to nose shaky; 2++ bilateral patellar reflexes. PSYCH:  Appropriate.  No visits with results within 3 Day(s) from this visit. Latest known visit with results is:  Lab on 12/20/2015  Component Date Value Ref Range Status  . WBC 12/20/2015 7.6  4.0 - 10.5 K/uL Final  . RBC 12/20/2015 4.82  3.87 - 5.11 Mil/uL Final  . Hemoglobin 12/20/2015 13.6  12.0 - 15.0 g/dL Final  . HCT 12/20/2015 39.7  36.0 - 46.0 % Final  . MCV 12/20/2015 82.2  78.0 - 100.0 fl Final  . MCHC 12/20/2015 34.3  30.0 - 36.0 g/dL Final  . RDW 12/20/2015 16.7* 11.5 - 15.5 % Final  . Platelets 12/20/2015 285.0  150.0 - 400.0 K/uL Final  . Neutrophils Relative % 12/20/2015 50.3  43.0 - 77.0 % Final  . Lymphocytes Relative 12/20/2015 38.7  12.0 - 46.0 % Final  . Monocytes Relative 12/20/2015 6.1  3.0 - 12.0 % Final  . Eosinophils Relative 12/20/2015 4.2  0.0 - 5.0 % Final  . Basophils Relative 12/20/2015 0.7  0.0 - 3.0 % Final  . Neutro Abs 12/20/2015 3.8  1.4 - 7.7 K/uL Final  . Lymphs Abs 12/20/2015 2.9  0.7 - 4.0 K/uL Final  . Monocytes Absolute 12/20/2015 0.5  0.1 - 1.0 K/uL Final  . Eosinophils Absolute 12/20/2015 0.3  0.0 - 0.7 K/uL Final  . Basophils Absolute 12/20/2015 0.1  0.0 - 0.1 K/uL Final  . TSH 12/20/2015 1.31  0.35 - 4.50 uIU/mL Final  . Cholesterol 12/20/2015 259* 0 - 200 mg/dL Final   ATP III Classification       Desirable:  <  200 mg/dL               Borderline High:  200 - 239 mg/dL          High:  > = 240 mg/dL  . Triglycerides 12/20/2015 205.0* 0.0 - 149.0 mg/dL Final   Normal:  <150 mg/dLBorderline High:  150 - 199 mg/dL  . HDL 12/20/2015 64.30  >39.00 mg/dL Final  . VLDL 12/20/2015 41.0* 0.0 - 40.0 mg/dL Final  . Total CHOL/HDL Ratio 12/20/2015 4   Final                  Men          Women1/2 Average Risk     3.4          3.3Average Risk          5.0          4.42X Average Risk          9.6          7.13X Average Risk          15.0          11.0                      . NonHDL 12/20/2015 194.88   Final   NOTE:  Non-HDL goal should be 30 mg/dL higher than patient's LDL goal (i.e. LDL goal of < 70 mg/dL, would have non-HDL goal of < 100 mg/dL)  . Total Bilirubin 12/20/2015 0.5  0.2 - 1.2 mg/dL Final  . Bilirubin, Direct 12/20/2015 0.1  0.0 - 0.3 mg/dL Final  . Alkaline Phosphatase 12/20/2015 83  39 - 117 U/L Final  . AST 12/20/2015 23  0 - 37 U/L Final  . ALT 12/20/2015 27  0 - 35 U/L Final  . Total Protein 12/20/2015 7.1  6.0 - 8.3 g/dL Final  . Albumin 12/20/2015 4.7  3.5 - 5.2 g/dL Final  . Sodium 12/20/2015 143  135 - 145 mEq/L Final  . Potassium 12/20/2015 4.6  3.5 - 5.1 mEq/L Final  .  Chloride 12/20/2015 104  96 - 112 mEq/L Final  . CO2 12/20/2015 29  19 - 32 mEq/L Final  . Glucose, Bld 12/20/2015 104* 70 - 99 mg/dL Final  . BUN 12/20/2015 9  6 - 23 mg/dL Final  . Creatinine, Ser 12/20/2015 0.69  0.40 - 1.20 mg/dL Final  . Calcium 12/20/2015 10.2  8.4 - 10.5 mg/dL Final  . GFR 12/20/2015 90.63  >60.00 mL/min Final  . Hgb A1c MFr Bld 12/20/2015 5.8  4.6 - 6.5 % Final   Glycemic Control Guidelines for People with Diabetes:Non Diabetic:  <6%Goal of Therapy: <7%Additional Action Suggested:  >8%   . Direct LDL 12/20/2015 138.0   Final   Optimal:  <100 mg/dLNear or Above Optimal:  100-129 mg/dLBorderline High:  130-159 mg/dLHigh:  160-189 mg/dLVery High:  >190 mg/dL    Assessment:  Carly [name] is a  65 y.o. female with relapsing multiple sclerosis for 4 years.  She received Tysabri monthly (last 02/13/2016).  She has had stability in function.  She developed herpes zoster on 03/14/2016.  The infection has resolved.  Decision has been made to switch to Ocrevus (ocrelizumb).  Hepatitis testing was negative.  Plan: 1. Discuss plan to switch to Ocrevus.  Discuss potential side effects associated with therapy.  Drug is similar to Rituxan (anti-CD20 antibody).  Discuss administration (300 mg day 1 and 15 then 600 mg in 6 months then every 6 months).  Discuss checking hepatitis serologies (risk of reactivation).  Discuss potential infusion reactions and premeds.  Discuss recent herpes zoster infection and reports of increased herpes infection on treatment (consider prophylactic acyclovir/valacyclovir).  Discuss increased risk of malignancy (breast cancer seen in 0.8% patients on clinical trial as compared to placebo).  Discuss ongoing screening mammogram (UTD per patient).  Discuss risk of PML.  Patient consented to treatment.  2.  Obtain copy of labs-done. 3.  Phone follow-up with Dr. Nathaneil Canary Jeffery's office-done. 4.  RTC on 04/11/2016 for day 1 of Ocrevus. 5.  RTC on 04/25/2016 for day 15 Ocrevus 6.  RTC in 6 months for MD assess and Ocrevus.   Lequita Asal, MD  04/09/2016, 10:46 AM

## 2016-04-09 NOTE — Progress Notes (Signed)
Pt reports she is changing medications this visit related to the new medication has promising effects.  Did not have her last Tysabri infusion related to having shingles.  Reports she has more weakness now related to not having her last infusion.  Only complaints of pain is a headache today.

## 2016-04-11 ENCOUNTER — Inpatient Hospital Stay: Payer: Medicare Other

## 2016-04-11 ENCOUNTER — Telehealth: Payer: Self-pay

## 2016-04-11 VITALS — BP 125/82 | HR 82 | Temp 98.9°F | Resp 19

## 2016-04-11 DIAGNOSIS — Z5112 Encounter for antineoplastic immunotherapy: Secondary | ICD-10-CM | POA: Diagnosis not present

## 2016-04-11 DIAGNOSIS — G35 Multiple sclerosis: Secondary | ICD-10-CM

## 2016-04-11 MED ORDER — SODIUM CHLORIDE 0.9 % IV SOLN
300.0000 mg | Freq: Once | INTRAVENOUS | Status: AC
  Administered 2016-04-11: 300 mg via INTRAVENOUS
  Filled 2016-04-11: qty 10

## 2016-04-11 MED ORDER — ACETAMINOPHEN 325 MG PO TABS: 650.0000 mg | ORAL_TABLET | Freq: Once | ORAL | Status: DC

## 2016-04-11 MED ORDER — METHYLPREDNISOLONE SODIUM SUCC 125 MG IJ SOLR
100.0000 mg | Freq: Once | INTRAMUSCULAR | Status: AC
Start: 2016-04-11 — End: 2016-04-11
  Administered 2016-04-11: 100 mg via INTRAVENOUS

## 2016-04-11 MED ORDER — SODIUM CHLORIDE 0.9 % IV SOLN
300.0000 mg | Freq: Once | INTRAVENOUS | Status: DC
  Filled 2016-04-11: qty 10

## 2016-04-11 MED ORDER — DIPHENHYDRAMINE HCL 50 MG/ML IJ SOLN
25.0000 mg | Freq: Once | INTRAMUSCULAR | Status: AC
  Administered 2016-04-11: 25 mg via INTRAVENOUS

## 2016-04-11 MED ORDER — METHYLPREDNISOLONE SODIUM SUCC 125 MG IJ SOLR
100.0000 mg | Freq: Once | INTRAMUSCULAR | Status: DC
  Filled 2016-04-11: qty 2

## 2016-04-11 MED ORDER — DIPHENHYDRAMINE HCL 50 MG/ML IJ SOLN
25.0000 mg | Freq: Once | INTRAMUSCULAR | Status: DC
  Filled 2016-04-11: qty 1

## 2016-04-11 NOTE — Telephone Encounter (Addendum)
Phone call returned from Neurology today.  They will be prescribing prophylactic Vacyclovir and per their office pt aware and script was sent in on 6/29 to pt's pharmacy.  No other concerns noted.  Number to call back if needed [phone] opt 3

## 2016-04-11 NOTE — Progress Notes (Signed)
Patient here for 1st infusion of Ocrevus. She tolerated treatment well. Patient was informed that it is possible to have an infusion reaction up to 24 hours post infusion. She was instructed to call her neurologist for any concerns. Patient is on valtrex prophylactic Post shingles  Outbreak.

## 2016-04-21 ENCOUNTER — Inpatient Hospital Stay: Payer: Medicare Other

## 2016-04-23 ENCOUNTER — Ambulatory Visit: Payer: Medicare Other | Admitting: Hematology and Oncology

## 2016-04-23 ENCOUNTER — Ambulatory Visit: Payer: Medicare Other

## 2016-04-25 ENCOUNTER — Inpatient Hospital Stay: Payer: Medicare Other | Attending: Hematology and Oncology

## 2016-04-25 VITALS — BP 140/80 | HR 90 | Temp 97.0°F | Resp 17

## 2016-04-25 DIAGNOSIS — G35 Multiple sclerosis: Secondary | ICD-10-CM

## 2016-04-25 DIAGNOSIS — Z79899 Other long term (current) drug therapy: Secondary | ICD-10-CM | POA: Diagnosis not present

## 2016-04-25 MED ORDER — SODIUM CHLORIDE 0.9 % IV SOLN
INTRAVENOUS | Status: AC
  Administered 2016-04-25: 09:00:00 via INTRAVENOUS
  Filled 2016-04-25: qty 1000

## 2016-04-25 MED ORDER — SODIUM CHLORIDE 0.9 % IV SOLN
300.0000 mg | Freq: Once | INTRAVENOUS | Status: AC
  Administered 2016-04-25: 300 mg via INTRAVENOUS
  Filled 2016-04-25: qty 10

## 2016-04-25 MED ORDER — ACETAMINOPHEN 325 MG PO TABS: 650.0000 mg | ORAL_TABLET | Freq: Once | ORAL | Status: AC

## 2016-04-25 MED ORDER — DIPHENHYDRAMINE HCL 50 MG/ML IJ SOLN
25.0000 mg | Freq: Once | INTRAMUSCULAR | Status: AC
  Administered 2016-04-25: 25 mg via INTRAVENOUS

## 2016-04-25 MED ORDER — METHYLPREDNISOLONE SODIUM SUCC 125 MG IJ SOLR
100.0000 mg | Freq: Once | INTRAMUSCULAR | Status: AC
  Administered 2016-04-25: 100 mg via INTRAVENOUS

## 2016-04-28 ENCOUNTER — Telehealth: Payer: Self-pay | Admitting: Internal Medicine

## 2016-04-28 NOTE — Telephone Encounter (Signed)
Pt called about needing more explanation regarding her lab results.   Call pt @ [phone]. Thank you!

## 2016-04-28 NOTE — Telephone Encounter (Signed)
If having persistent pain from shingles, needs to be evaluated.  Changing to B12 injections would not make a difference in the pain she is experiencing from shingles.

## 2016-04-28 NOTE — Telephone Encounter (Signed)
Pt is scheduled for 10/5 @ 3pm.

## 2016-04-28 NOTE — Telephone Encounter (Signed)
Can you call and schedule a follow up for her Shingles, thanks

## 2016-04-28 NOTE — Telephone Encounter (Signed)
Patient wanted further explanation of her labs that were mailed to her.  Advised of them and requested a copy of a low cholesterol diet to be mailed to her.    She also wanted to let Dr. Nicki Reaper know that she is on week 5 of shingles and is still miserable, she is taking a OTC vitamin B12 5000mg  and wanted to know if she felt a b12 injection would help?  I explained that she would most likely request labs to check a level to start regardless.  Please advise. thanks

## 2016-05-05 ENCOUNTER — Ambulatory Visit: Payer: Medicare Other

## 2016-05-07 ENCOUNTER — Ambulatory Visit: Payer: Medicare Other

## 2016-05-07 ENCOUNTER — Ambulatory Visit: Payer: Medicare Other | Admitting: Hematology and Oncology

## 2016-05-07 ENCOUNTER — Inpatient Hospital Stay: Payer: Medicare Other

## 2016-05-14 ENCOUNTER — Ambulatory Visit: Payer: Medicare Other

## 2016-05-14 ENCOUNTER — Ambulatory Visit: Payer: Medicare Other | Admitting: Hematology and Oncology

## 2016-05-16 ENCOUNTER — Encounter: Payer: Self-pay | Admitting: Internal Medicine

## 2016-06-02 ENCOUNTER — Ambulatory Visit: Payer: Medicare Other

## 2016-06-06 DIAGNOSIS — Z86010 Personal history of colonic polyps: Secondary | ICD-10-CM | POA: Insufficient documentation

## 2016-06-10 ENCOUNTER — Ambulatory Visit
Admission: RE | Admit: 2016-06-10 | Discharge: 2016-06-10 | Disposition: A | Discharge status: Home / Self Care | Payer: Medicare Other | Source: Ambulatory Visit | Attending: Internal Medicine | Admitting: Internal Medicine

## 2016-06-10 ENCOUNTER — Encounter: Payer: Self-pay | Admitting: Physical Therapy

## 2016-06-10 ENCOUNTER — Ambulatory Visit: Payer: Medicare Other | Attending: Unknown Physician Specialty | Admitting: Physical Therapy

## 2016-06-10 DIAGNOSIS — Z780 Asymptomatic menopausal state: Secondary | ICD-10-CM | POA: Insufficient documentation

## 2016-06-10 DIAGNOSIS — R262 Difficulty in walking, not elsewhere classified: Secondary | ICD-10-CM | POA: Insufficient documentation

## 2016-06-10 DIAGNOSIS — M6281 Muscle weakness (generalized): Secondary | ICD-10-CM | POA: Insufficient documentation

## 2016-06-10 DIAGNOSIS — M8580 Other specified disorders of bone density and structure, unspecified site: Secondary | ICD-10-CM | POA: Insufficient documentation

## 2016-06-10 DIAGNOSIS — Z13820 Encounter for screening for osteoporosis: Secondary | ICD-10-CM | POA: Diagnosis not present

## 2016-06-10 DIAGNOSIS — E2839 Other primary ovarian failure: Secondary | ICD-10-CM

## 2016-06-11 NOTE — Therapy (Signed)
[facility] [address], [place], [address] Phone: [phone]   Fax:  [phone]  Physical Therapy Evaluation  Patient Details  Name: Carly [name] MRN: [id number] Date of Birth: April 12, 1951 Referring Provider: Effie Shy MD  Encounter Date: 06/10/2016      PT End of Session - 06/10/16 1832    Visit Number 1   Number of Visits 12   Date for PT Re-Evaluation 07/22/16   Authorization Type 1   Authorization Time Period 10 (G code)   PT Start Time [id number]   PT Stop Time 1845   PT Time Calculation (min) 50 min   Activity Tolerance Patient limited by fatigue   Behavior During Therapy Anxious;WFL for tasks assessed/performed      Past Medical History:  Diagnosis Date  . Allergy   . Depression   . Frequent headaches    H/O  . History of chicken pox   . History of colon polyps   . Hx of migraines   . Multiple sclerosis ([place])   . PONV (postoperative nausea and vomiting)     Past Surgical History:  Procedure Laterality Date  . FOOT SURGERY  2015  . GALLBLADDER SURGERY  2008  . HARDWARE REMOVAL Left 02/14/2016   Procedure: LEFT FOOT REMOVAL DEEP IMPLANT;  Surgeon: [name], MD;  Location: [facility];  Service: Orthopedics;  Laterality: Left;  . SPINE SURGERY  2014    There were no vitals filed for this visit.       Subjective Assessment - 06/10/16 1816    Subjective Patient reports she feels weak and fatigues easily since having shingles. Currently she feels she is improving slowly with energy levels.    Pertinent History shingles recently, MS x 15 years.    Limitations Standing;Walking   How long can you stand comfortably? 15 min.   How long can you walk comfortably? 10 min.   Patient Stated Goals Patient would like to improve confidence and walk without AD   Currently in Pain? No/denies            [facility] PT Assessment - 06/10/16 1758      Assessment    Medical Diagnosis Multiple Sclerosis   Referring Provider Effie Shy MD   Onset Date/Surgical Date 03/13/16   Hand Dominance Right   Next MD Visit unknown   Prior Therapy none for this episode of weakness     Precautions   Precautions None     Balance Screen   Has the patient fallen in the past 6 months No   Has the patient had a decrease in activity level because of a fear of falling?  No   Is the patient reluctant to leave their home because of a fear of falling?  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   Leisure socialize, read, go out  and travel to [employer]   Overall Cognitive Status Within Functional Limits for tasks assessed     Objective: Gait; ambulating into clinic with support of husband and using SPC for balance Balance standing: unable to standing without support Strength;  LE's: left hip flexion 3-/5, abduction 3-/5, ER 3-/5, knee extension 4-/5 knee flexion 4-/5 (all with decreased endurance and increased difficulty with multiple repetitions Core control decreased for lateral flexion bilaterally, decreased ability to perform static and dynamic stabilization  Outcome measures; LEFS 10MW ABC scale 50% decreased confidence with functional daily activities  Treatment: Therapeutic exercise:patient performed exercises with guidance, verbal and tactile cues and demonstration of  PT: Sitting: Hip adduction with ball and glute sets x 15 reps Hip abduction with resistive band x 15 reps Roll ball under foot x 2 min. Scapular retraction x 10 Side to side reach for weight shifting  Sit to stand with weight shifting, right LE on airex pad 10 reps  Patient response to treatment; patient required moderate constant cuing to perform exercises with correct alignment and technique,technique improved with repetition          PT Education - 06/10/16 1845    Education provided Yes   Education Details HEP: hip adduction with ball glute sets, hip abduction with resistive band sitting, ball under foot roll outs   Person(s) Educated Patient   Methods Demonstration;Explanation;Verbal cues;Handout   Comprehension Verbalized understanding;Verbal cues required;Returned demonstration             PT Long Term Goals - 06/10/16 1855      PT LONG TERM GOAL #1   Title pt's gait speed will be at least 0.8 m/s with LRAD for full community ambulation by 07/22/2016   Baseline to be assessed   Status New     PT LONG TERM GOAL #2   Title pt's ABC scale score will improve by at least 10 points by 07/22/2016 indicating improved confidence with daily tasks at home and in community   Baseline 50%   Status New     PT LONG TERM GOAL #3   Title Patient will be independent with home program for strength and endurance in order to transition to self management once discharged from physical therapy 07/22/2016   Baseline requires maximal cuing and assistance to perform exercises   Status New               Plan - 06/10/16 1850    Clinical Impression Statement Patient is a 65 year old right hand dominant female who presents with weakness and difficulty walking following Shingles. She has weakness left LE >right LE and decreased balance and endurance. Her confidence levle is 50% and she has limitations with household chores, community ambulation. She will benefit from physical therapy  intervention to imrpove strength and endurance for improved function with household chores, community activities.    Rehab Potential Fair   Clinical Impairments Affecting Rehab Potential (+) family support, motivated (-) MS, decreased confidence, co morbidities   PT Frequency 2x / week   PT Duration 6 weeks   PT Treatment/Interventions Patient/family education;Gait training;Neuromuscular re-education;Therapeutic exercise;Manual techniques   PT Next Visit Plan exercises for strength, endurance   PT Home Exercise Plan strengthening exercises for LE's, core control   Consulted and Agree with Plan of Care Patient      Patient will benefit from skilled therapeutic intervention in order to improve the following deficits  and impairments:  Decreased strength, Decreased balance, Decreased activity tolerance, Decreased endurance, Difficulty walking  Visit Diagnosis: Muscle weakness (generalized) - Plan: PT plan of care cert/re-cert  Difficulty in walking, not elsewhere classified - Plan: PT plan of care cert/re-cert      G-Codes - [id number]    Functional Assessment Tool Used ABC scalel, strength deficits, clinical judgment   Functional Limitation Mobility: Walking and moving around   Mobility: Walking and Moving Around Current Status [phone]) At least 40 percent but less than 60 percent impaired, limited or restricted   Mobility: Walking and Moving Around Goal Status [phone]) At least 20 percent but less than 40 percent impaired, limited or restricted       Problem List Patient Active Problem List   Diagnosis Date Noted  . Lower extremity edema 01/13/2016  . Bilateral ovarian cysts 10/16/2015  . Health care maintenance 10/16/2015  . Sinusitis 03/12/2015  . Colon polyp 01/25/2015  . Bone/cartilage disorder 01/25/2015  . Headache 11/18/2014  . Depression 11/18/2014  . Greater tuberosity of humerus fracture 11/18/2014  . Multiple sclerosis ([place]) 11/18/2014  . Unsteady gait 11/18/2014   . Dizziness 11/18/2014  . Inconclusive mammogram 03/15/2013  . Back ache 09/06/2012  . BP (high blood pressure) 09/06/2012  . HLD (hyperlipidemia) 01/28/2012  . Climacteric 01/28/2012  . Routine general medical examination at a health care facility 01/28/2012  . Avitaminosis D 01/28/2012    Jomarie Longs PT 06/11/2016, 10:41 PM  [facility] [address] [place], [address] Phone: [phone]   Fax:  [phone]  Name: Carly [name] MRN: [id number] Date of Birth: 1951-03-21

## 2016-06-17 ENCOUNTER — Encounter: Payer: Self-pay | Admitting: Physical Therapy

## 2016-06-17 ENCOUNTER — Ambulatory Visit: Payer: Medicare Other | Attending: Unknown Physician Specialty | Admitting: Physical Therapy

## 2016-06-17 DIAGNOSIS — M6281 Muscle weakness (generalized): Secondary | ICD-10-CM | POA: Diagnosis not present

## 2016-06-17 DIAGNOSIS — R262 Difficulty in walking, not elsewhere classified: Secondary | ICD-10-CM | POA: Diagnosis present

## 2016-06-17 NOTE — Therapy (Signed)
[name] [address], [place], [address] Phone: [phone]   Fax:  [phone]  Physical Therapy Treatment  Patient Details  Name: Carly [name] MRN: [id number] Date of Birth: 1951-09-13 Referring Provider: Effie Shy MD  Encounter Date: 06/17/2016      PT End of Session - 06/17/16 0953    Visit Number 2   Number of Visits 12   Date for PT Re-Evaluation 07/22/16   Authorization Type 2   Authorization Time Period 10 (G code)   PT Start Time [phone]   PT Stop Time 1031   PT Time Calculation (min) 43 min   Activity Tolerance Patient limited by fatigue   Behavior During Therapy Anxious;WFL for tasks assessed/performed      Past Medical History:  Diagnosis Date  . Allergy   . Depression   . Frequent headaches    H/O  . History of chicken pox   . History of colon polyps   . Hx of migraines   . Multiple sclerosis ([place])   . PONV (postoperative nausea and vomiting)     Past Surgical History:  Procedure Laterality Date  . FOOT SURGERY  2015  . GALLBLADDER SURGERY  2008  . HARDWARE REMOVAL Left 02/14/2016   Procedure: LEFT FOOT REMOVAL DEEP IMPLANT;  Surgeon: [name], MD;  Location: [facility];  Service: Orthopedics;  Laterality: Left;  . SPINE SURGERY  2014    There were no vitals filed for this visit.      Subjective Assessment - 06/17/16 0951    Subjective Patient reports she is sore from exercises. She is sore in abdominal muscles.    Limitations Standing;Walking   Patient Stated Goals Patient would like to improve confidence and walk without AD   Currently in Pain? No/denies      Objective: Gait: ambulating with SPC and assistance of one for support (walking on left side)  Treatment: Therapeutic exercise:patient performed exercises with guidance, verbal and tactile cues and demonstration of PT: Sitting: Roll ball out/back with 2# weight on ankle 25x each  LE Hip flexion 10x right 2-3x 5 reps with assistance of therapist on left Hip abduction with green band 2 x 10 Knee extension 2# ankle weight x 15 reps each LE Knee flexion green resistive band x 15 right LE, 10x partial ROM left LE Sit to stand on airex pad and shift weight side to side and forward/back x 1 min. Each with UE support on chair and contact guard of therapist Tap balance stones x 15 reps each Reach out to side with weight shifting in sitting on treatment table x 15 reps Toss ball x 25 reps sitting on treatment table Sit to stand with right LE on balance stone to encourage weight shift to left LE 3 x 5 reps off high table with contact guard x 1 and use of chair for UE support  Patient response to treatment:  Improved ability to stand from sitting with less difficulty and improve weight shifting, requires assistance and contact guarding for safety, fatigue at end of session required wheelchair to return to car.          PT Education - 06/17/16 [phone]    Education provided Yes   Education Details HEP: re assessed all exercises   Person(s) Educated Patient   Methods Explanation;Demonstration;Verbal cues   Comprehension Returned demonstration;Verbal cues required;Verbalized understanding  PT Long Term Goals - 06/10/16 1855      PT LONG TERM GOAL #1   Title pt's gait speed will be at least 0.8 m/s with LRAD for full community ambulation by 07/22/2016   Baseline to be assessed   Status New     PT LONG TERM GOAL #2   Title pt's ABC scale score will improve by at least 10 points by 07/22/2016 indicating improved confidence with daily tasks at home and in community   Baseline 50%   Status New     PT LONG TERM GOAL #3   Title Patient will be independent with home program for strength and endurance in order to transition to self management once discharged from physical therapy 07/22/2016   Baseline requires maximal cuing and assistance to perform exercises    Status New               Plan - 06/17/16 1052    Clinical Impression Statement Patient demonstrated improved gait and endurance today and was able to tolerate increased intenstiy of exercise. She did fatigue at end of session and was unable to walk to car due to continued weakness in LE's. She will benefit from continued physical therapy intervention to progress exercises and achieve goals.    Rehab Potential Fair   PT Frequency 2x / week   PT Duration 6 weeks   PT Treatment/Interventions Patient/family education;Gait training;Neuromuscular re-education;Therapeutic exercise;Manual techniques   PT Next Visit Plan exercises for strength, endurance   PT Home Exercise Plan strengthening exercises for LE's, core control      Patient will benefit from skilled therapeutic intervention in order to improve the following deficits and impairments:  Decreased strength, Decreased balance, Decreased activity tolerance, Decreased endurance, Difficulty walking  Visit Diagnosis: Muscle weakness (generalized)  Difficulty in walking, not elsewhere classified     Problem List Patient Active Problem List   Diagnosis Date Noted  . Lower extremity edema 01/13/2016  . Bilateral ovarian cysts 10/16/2015  . Health care maintenance 10/16/2015  . Sinusitis 03/12/2015  . Colon polyp 01/25/2015  . Bone/cartilage disorder 01/25/2015  . Headache 11/18/2014  . Depression 11/18/2014  . Greater tuberosity of humerus fracture 11/18/2014  . Multiple sclerosis ([place]) 11/18/2014  . Unsteady gait 11/18/2014  . Dizziness 11/18/2014  . Inconclusive mammogram 03/15/2013  . Back ache 09/06/2012  . BP (high blood pressure) 09/06/2012  . HLD (hyperlipidemia) 01/28/2012  . Climacteric 01/28/2012  . Routine general medical examination at a health care facility 01/28/2012  . Avitaminosis D 01/28/2012    Carly [name] PT 06/17/2016, 4:07 PM  [facility] [address], [place], [address] Phone: [phone]   Fax:  [phone]  Name: Carly [name] MRN: [id number] Date of Birth: June 02, 1951

## 2016-06-23 ENCOUNTER — Ambulatory Visit: Payer: Medicare Other | Admitting: Physical Therapy

## 2016-06-23 ENCOUNTER — Encounter: Payer: Self-pay | Admitting: Physical Therapy

## 2016-06-23 DIAGNOSIS — M6281 Muscle weakness (generalized): Secondary | ICD-10-CM | POA: Diagnosis not present

## 2016-06-23 DIAGNOSIS — R262 Difficulty in walking, not elsewhere classified: Secondary | ICD-10-CM

## 2016-06-23 NOTE — Therapy (Signed)
[facility] [address], [place], [address] Phone: [phone]   Fax:  [phone]  Physical Therapy Treatment  Patient Details  Name: Carly [name] MRN: [id number] Date of Birth: [birth year] Referring Provider: Effie Shy MD  Encounter Date: 06/23/2016      PT End of Session - 06/23/16 1236    Visit Number 3   Number of Visits 12   Date for PT Re-Evaluation 07/22/16   Authorization Type 3   Authorization Time Period 10 (G code)   PT Start Time 1008   PT Stop Time 1050   PT Time Calculation (min) 42 min   Activity Tolerance Patient limited by fatigue   Behavior During Therapy Anxious;WFL for tasks assessed/performed      Past Medical History:  Diagnosis Date  . Allergy   . Depression   . Frequent headaches    H/O  . History of chicken pox   . History of colon polyps   . Hx of migraines   . Multiple sclerosis ([place])   . PONV (postoperative nausea and vomiting)     Past Surgical History:  Procedure Laterality Date  . FOOT SURGERY  2015  . GALLBLADDER SURGERY  2008  . HARDWARE REMOVAL Left 02/14/2016   Procedure: LEFT FOOT REMOVAL DEEP IMPLANT;  Surgeon: [name], MD;  Location: [facility];  Service: Orthopedics;  Laterality: Left;  . SPINE SURGERY  2014    There were no vitals filed for this visit.      Subjective Assessment - 06/23/16 1013    Subjective Patient reports intermittent swelling in left foot. She reports she is noticing improvement in strength which allows her to walk with less difficulty around the home and in familiar places. She still lacks confidence with walking outside or in open spaces.   Limitations Standing;Walking   Patient Stated Goals Patient would like to improve confidence and walk without AD   Currently in Pain? No/denies      Objective: Gait: using SPC on right and support of husband (on left side): forward flexed at hips, knees,  IR at hips  Treatment: Therapeutic exercise:patient performed exercises with guidance, verbal and tactile cues and demonstration of PT: Sitting: Roll ball out/back with 2# weight on ankle 25x each LE Hip flexion with hold end range Left hip with eccentric control 2 sets x 5 reps Hip abduction/ER with green band 2 x 10 Knee extension 2# ankle weight  2 x 15 reps each LE Knee flexion green resistive band  2 x 15 - 25 reps each LE Lumbar extension with red resistive band around trunk x 25 reps with patient seated on table, instructed to perform in chair through partial ROM Reach out to side with weight shifting in sitting on treatment table x 15 reps Sit to stand with right LE on balance pad to encourage weight shift to left LE 3 x 5 reps off high table with close supervision, non CG x 1; then sit to stand without balance pad with controlled lowering x 5 reps and minimal assistance of therapist for balance and support to stand  Patient response to treatment:  Patient demonstrated improved ability to perform sit to stand with equal weight bearing right/left LE, required moderate verbal cuing to perform all exercises with correct alignment of trunk/LE's, she required decreased assistance to walk as compared to previous session       PT Education - 06/23/16 1159  Education provided Yes   Education Details HEP; added lumbar extension with resistive band   Person(s) Educated Patient   Methods Demonstration;Verbal cues;Explanation   Comprehension Verbalized understanding;Returned demonstration;Verbal cues required             PT Long Term Goals - 06/10/16 1855      PT LONG TERM GOAL #1   Title pt's gait speed will be at least 0.8 m/s with LRAD for full community ambulation by 07/22/2016   Baseline to be assessed   Status New     PT LONG TERM GOAL #2   Title pt's ABC scale score will improve by at least 10 points by 07/22/2016 indicating improved confidence with daily tasks at  home and in community   Baseline 50%   Status New     PT LONG TERM GOAL #3   Title Patient will be independent with home program for strength and endurance in order to transition to self management once discharged from physical therapy 07/22/2016   Baseline requires maximal cuing and assistance to perform exercises   Status New               Plan - 06/23/16 1055    Clinical Impression Statement Patient is progressing with improved strength and endurance for exercise and ambulation. She continues with decreased confidence with walking and will benefit from additional physical therapy intervention to improve household and community ambulation.   Rehab Potential Fair   PT Frequency 2x / week   PT Duration 6 weeks   PT Treatment/Interventions Patient/family education;Gait training;Neuromuscular re-education;Therapeutic exercise;Manual techniques   PT Next Visit Plan exercises for strength, endurance   PT Home Exercise Plan strengthening exercises for LE's, core control      Patient will benefit from skilled therapeutic intervention in order to improve the following deficits and impairments:  Decreased strength, Decreased balance, Decreased activity tolerance, Decreased endurance, Difficulty walking  Visit Diagnosis: Difficulty in walking, not elsewhere classified  Muscle weakness (generalized)     Problem List Patient Active Problem List   Diagnosis Date Noted  . Lower extremity edema 01/13/2016  . Bilateral ovarian cysts 10/16/2015  . Health care maintenance 10/16/2015  . Sinusitis 03/12/2015  . Colon polyp 01/25/2015  . Bone/cartilage disorder 01/25/2015  . Headache 11/18/2014  . Depression 11/18/2014  . Greater tuberosity of humerus fracture 11/18/2014  . Multiple sclerosis ([place]) 11/18/2014  . Unsteady gait 11/18/2014  . Dizziness 11/18/2014  . Inconclusive mammogram 03/15/2013  . Back ache 09/06/2012  . BP (high blood pressure) 09/06/2012  . HLD  (hyperlipidemia) 01/28/2012  . Climacteric 01/28/2012  . Routine general medical examination at a health care facility 01/28/2012  . Avitaminosis D 01/28/2012    Jomarie Longs PT 06/24/2016, 10:59 AM  [facility] [address], [place], [address] Phone: [phone]   Fax:  [phone]  Name: AMRIE HUNSTAD MRN: [id number] Date of Birth: 1950/11/29

## 2016-06-25 ENCOUNTER — Encounter: Payer: Self-pay | Admitting: Physical Therapy

## 2016-06-25 ENCOUNTER — Ambulatory Visit: Payer: Medicare Other | Admitting: Physical Therapy

## 2016-06-25 DIAGNOSIS — M6281 Muscle weakness (generalized): Secondary | ICD-10-CM

## 2016-06-25 DIAGNOSIS — R262 Difficulty in walking, not elsewhere classified: Secondary | ICD-10-CM

## 2016-06-25 NOTE — Therapy (Signed)
[facility] [address], [place], [address] Phone: [phone]   Fax:  [phone]  Physical Therapy Treatment  Patient Details  Name: Carly [name] MRN: [id number] Date of Birth: May 02, 1951 Referring Provider: Effie Shy MD  Encounter Date: 06/25/2016      PT End of Session - 06/25/16 1100    Visit Number 4   Number of Visits 12   Date for PT Re-Evaluation 07/22/16   Authorization Type 4   Authorization Time Period 10 (G code)   PT Start Time [id number]   PT Stop Time 1045   PT Time Calculation (min) 43 min   Activity Tolerance Patient limited by fatigue   Behavior During Therapy Anxious;WFL for tasks assessed/performed      Past Medical History:  Diagnosis Date  . Allergy   . Depression   . Frequent headaches    H/O  . History of chicken pox   . History of colon polyps   . Hx of migraines   . Multiple sclerosis ([place])   . PONV (postoperative nausea and vomiting)     Past Surgical History:  Procedure Laterality Date  . FOOT SURGERY  2015  . GALLBLADDER SURGERY  2008  . HARDWARE REMOVAL Left 02/14/2016   Procedure: LEFT FOOT REMOVAL DEEP IMPLANT;  Surgeon: [name], MD;  Location: [facility];  Service: Orthopedics;  Laterality: Left;  . SPINE SURGERY  2014    There were no vitals filed for this visit.      Subjective Assessment - 06/25/16 1004    Subjective exercised on bike at home after 2x/day exercises yesterday and today has increased swelling in left ankle and foot today. "i haven't been this tired until after having Shingles" (July 2017)   Limitations Standing;Walking   Patient Stated Goals Patient would like to improve confidence and walk without AD   Currently in Pain? No/denies      Objective;  Gait; ambulating slow cadence, more erect posture than previous session  Treatment: Therapeutic exercise: patient performed exercises with guidance, verbal  and tactile cues and demonstration of PT: Supine lying; Stretching left LE hip SLR x 3, hip ER/IR x 5 reps with hold end ranges LTR through short arc x 15 reps Core control exercises hook lying: Marching x 10  Bilateral overhead flexion x 10 Bridging x 10  Dead bug exercise opposite arm flexion with marching x 10  Patient response to treatment: improved flexibility in hip and improved ability to walk with less stiffness and improved posture and cadence. Good return demonstration of exercises following instruction and with guidance and assistance and verbal cuing       PT Education - 06/25/16 1045    Education provided Yes   Education Details HEP: supine lying exercises for core control, strengthening LE's   Person(s) Educated Patient   Methods Explanation;Demonstration;Verbal cues;Handout   Comprehension Verbalized understanding;Returned demonstration;Verbal cues required             PT Long Term Goals - 06/10/16 1855      PT LONG TERM GOAL #1   Title pt's gait speed will be at least 0.8 m/s with LRAD for full community ambulation by 07/22/2016   Baseline to be assessed   Status New     PT LONG TERM GOAL #2   Title pt's ABC scale score will improve by at least 10 points by 07/22/2016 indicating improved confidence with daily tasks at home and in  community   Baseline 50%   Status New     PT LONG TERM GOAL #3   Title Patient will be independent with home program for strength and endurance in order to transition to self management once discharged from physical therapy 07/22/2016   Baseline requires maximal cuing and assistance to perform exercises   Status New               Plan - 06/25/16 1200    Clinical Impression Statement Modified exercises to lying down due to excessive fatigue. Patient is progressing slowly with improving strength and balance s/p shingles due to co morbidity of multiple sclerosis. She has been trying to exercise more at home and became  excessively fatigued. She continues with weakness and requires assistance and guidance to perform exercises wtih good technique.   Rehab Potential Fair   PT Frequency 2x / week   PT Duration 6 weeks   PT Treatment/Interventions Patient/family education;Gait training;Neuromuscular re-education;Therapeutic exercise;Manual techniques   PT Next Visit Plan exercises for strength, endurance   PT Home Exercise Plan strengthening exercises for LE's, core control      Patient will benefit from skilled therapeutic intervention in order to improve the following deficits and impairments:  Decreased strength, Decreased balance, Decreased activity tolerance, Decreased endurance, Difficulty walking  Visit Diagnosis: Muscle weakness (generalized)  Difficulty in walking, not elsewhere classified     Problem List Patient Active Problem List   Diagnosis Date Noted  . Lower extremity edema 01/13/2016  . Bilateral ovarian cysts 10/16/2015  . Health care maintenance 10/16/2015  . Sinusitis 03/12/2015  . Colon polyp 01/25/2015  . Bone/cartilage disorder 01/25/2015  . Headache 11/18/2014  . Depression 11/18/2014  . Greater tuberosity of humerus fracture 11/18/2014  . Multiple sclerosis ([place]) 11/18/2014  . Unsteady gait 11/18/2014  . Dizziness 11/18/2014  . Inconclusive mammogram 03/15/2013  . Back ache 09/06/2012  . BP (high blood pressure) 09/06/2012  . HLD (hyperlipidemia) 01/28/2012  . Climacteric 01/28/2012  . Routine general medical examination at a health care facility 01/28/2012  . Avitaminosis D 01/28/2012    Jomarie Longs PT 06/26/2016, 12:46 PM  [facility] [address] [place], [address] Phone: [phone]   Fax:  [phone]  Name: [name] MRN: [id number] Date of Birth: 05/21/1951

## 2016-06-30 ENCOUNTER — Ambulatory Visit: Payer: Medicare Other

## 2016-07-02 ENCOUNTER — Encounter: Payer: Self-pay | Admitting: Physical Therapy

## 2016-07-02 ENCOUNTER — Ambulatory Visit: Payer: Medicare Other | Admitting: Physical Therapy

## 2016-07-02 DIAGNOSIS — R262 Difficulty in walking, not elsewhere classified: Secondary | ICD-10-CM

## 2016-07-02 DIAGNOSIS — M6281 Muscle weakness (generalized): Secondary | ICD-10-CM | POA: Diagnosis not present

## 2016-07-02 NOTE — Therapy (Signed)
[facility] [address] [place], [address] Phone: [phone]   Fax:  [phone]  Physical Therapy Treatment  Patient Details  Name: Carly [name] MRN: [id number] Date of Birth: 09/21/1951 Referring Provider: Effie Shy MD  Encounter Date: 07/02/2016      PT End of Session - 07/02/16 1039    Visit Number 5   Number of Visits 12   Date for PT Re-Evaluation 07/22/16   Authorization Type 5   Authorization Time Period 10 (G code)   PT Start Time [phone]   PT Stop Time 1030   PT Time Calculation (min) 47 min   Activity Tolerance Patient tolerated treatment well   Behavior During Therapy Anxious;WFL for tasks assessed/performed      Past Medical History:  Diagnosis Date  . Allergy   . Depression   . Frequent headaches    H/O  . History of chicken pox   . History of colon polyps   . Hx of migraines   . Multiple sclerosis ([place])   . PONV (postoperative nausea and vomiting)     Past Surgical History:  Procedure Laterality Date  . FOOT SURGERY  2015  . GALLBLADDER SURGERY  2008  . HARDWARE REMOVAL Left 02/14/2016   Procedure: LEFT FOOT REMOVAL DEEP IMPLANT;  Surgeon: [name], MD;  Location: [facility];  Service: Orthopedics;  Laterality: Left;  . SPINE SURGERY  2014    There were no vitals filed for this visit.      Subjective Assessment - 07/02/16 0952    Subjective Less swelling in her left LE and ankle. She is still having anxiety about falling and is seeing someone for this. She reports she is much steadier at home with walking than out in public and is working on not being so anxious when outside her home.   Limitations Standing;Walking   Patient Stated Goals Patient would like to improve confidence and walk without AD   Currently in Pain? No/denies      Objective: Gait: ambulating with SPC on right and assistance of husband for balance, security on left Tone:  increased left LE extensor tone on left with walking, stress/anxiety  Treatment: Therapeutic exercise:patient performed exercises with guidance, verbal and tactile cues and demonstration of PT: Supine lying: Stretching left LE rotations and flexion of hip x 5 reps Hip fall outs left and right hip for ER in hook lying position, 15 reps each Bridging with ball between knees x 10 reps with cuing for stabilization Sitting: Knee extension 3# ankle weight  1 x 15 reps each LE Knee flexion red resistive band  1 x  25 reps each LE Lumbar extension with red resistive band around trunk x 25 reps with patient seated on table Red resistive band for scapular rows low and straight arm pull downs x 15 reps each Toss ball 2 x 15 reps with patient seated with unsupported trunk on treatment table Reach out to side with weight shifting in sitting on treatment table x 15 reps Sit to stand from high table x 5 reps with close supervision, verbal cuing Transfer from treatment table to chair 3' away using SPC and verbal cues, CG as needed for safety to perform with correct sequence and to be safe with sitting; stand in front of chair, don't reach before she gets to chair Standing at back of chair (for support) performed weight shifting: marching, side to side stepping, forward to tapping  3-5 reps each   Patient response to treatment:  Patient demonstrated improved transfers with increased safety awareness with repeated attempts to perform. She requires cuing and demonstration for most exercises to perform with good alignment and proper sequence/technique          PT Education - 07/02/16 1038    Education provided Yes   Education Details sit to stand and transfer weight to each LE, walk to/from chair with good sequencing and safety   Person(s) Educated Patient   Methods Explanation;Demonstration;Verbal cues   Comprehension Verbalized understanding;Returned demonstration;Verbal cues required              PT Long Term Goals - 06/10/16 1855      PT LONG TERM GOAL #1   Title pt's gait speed will be at least 0.8 m/s with LRAD for full community ambulation by 07/22/2016   Baseline to be assessed   Status New     PT LONG TERM GOAL #2   Title pt's ABC scale score will improve by at least 10 points by 07/22/2016 indicating improved confidence with daily tasks at home and in community   Baseline 50%   Status New     PT LONG TERM GOAL #3   Title Patient will be independent with home program for strength and endurance in order to transition to self management once discharged from physical therapy 07/22/2016   Baseline requires maximal cuing and assistance to perform exercises   Status New               Plan - 07/02/16 1040    Clinical Impression Statement Patient's anxiety contributes to decreased ability to walk without fear of falling. Her LE tone increases with her anxiety. She is able to perform exercises for weight shifting and marching while supporting self on chair back or at a counter. She continues to require cuing for safety with standing, walking and to perform exercises with correct posture and technique.   Rehab Potential Fair   PT Frequency 2x / week   PT Duration 6 weeks   PT Treatment/Interventions Patient/family education;Gait training;Neuromuscular re-education;Therapeutic exercise;Manual techniques   PT Next Visit Plan exercises for strength, endurance, walking   PT Home Exercise Plan strengthening exercises for LE's, core control      Patient will benefit from skilled therapeutic intervention in order to improve the following deficits and impairments:  Decreased strength, Decreased balance, Decreased activity tolerance, Decreased endurance, Difficulty walking  Visit Diagnosis: Muscle weakness (generalized)  Difficulty in walking, not elsewhere classified     Problem List Patient Active Problem List   Diagnosis Date Noted  . Lower extremity edema  01/13/2016  . Bilateral ovarian cysts 10/16/2015  . Health care maintenance 10/16/2015  . Sinusitis 03/12/2015  . Colon polyp 01/25/2015  . Bone/cartilage disorder 01/25/2015  . Headache 11/18/2014  . Depression 11/18/2014  . Greater tuberosity of humerus fracture 11/18/2014  . Multiple sclerosis ([place]) 11/18/2014  . Unsteady gait 11/18/2014  . Dizziness 11/18/2014  . Inconclusive mammogram 03/15/2013  . Back ache 09/06/2012  . BP (high blood pressure) 09/06/2012  . HLD (hyperlipidemia) 01/28/2012  . Climacteric 01/28/2012  . Routine general medical examination at a health care facility 01/28/2012  . Avitaminosis D 01/28/2012    Jomarie Longs PT 07/02/2016, 10:44 AM  [facility] [address] [place], [address] Phone: [phone]   Fax:  [phone]  Name: CHERYL-ANN OAKLEY MRN: [id number] Date of Birth: 24-Aug-1951

## 2016-07-07 ENCOUNTER — Ambulatory Visit: Payer: Medicare Other | Admitting: Physical Therapy

## 2016-07-08 ENCOUNTER — Encounter: Payer: Medicare Other | Admitting: Physical Therapy

## 2016-07-09 ENCOUNTER — Ambulatory Visit (INDEPENDENT_AMBULATORY_CARE_PROVIDER_SITE_OTHER): Payer: Medicare Other | Admitting: Internal Medicine

## 2016-07-09 ENCOUNTER — Encounter: Payer: Self-pay | Admitting: Internal Medicine

## 2016-07-09 ENCOUNTER — Encounter: Payer: Medicare Other | Admitting: Physical Therapy

## 2016-07-09 VITALS — BP 132/80 | HR 98 | Temp 98.2°F | Ht 63.0 in | Wt 168.8 lb

## 2016-07-09 DIAGNOSIS — R2681 Unsteadiness on feet: Secondary | ICD-10-CM | POA: Diagnosis not present

## 2016-07-09 DIAGNOSIS — R51 Headache: Secondary | ICD-10-CM | POA: Diagnosis not present

## 2016-07-09 DIAGNOSIS — G35 Multiple sclerosis: Secondary | ICD-10-CM

## 2016-07-09 DIAGNOSIS — F329 Major depressive disorder, single episode, unspecified: Secondary | ICD-10-CM

## 2016-07-09 DIAGNOSIS — R739 Hyperglycemia, unspecified: Secondary | ICD-10-CM

## 2016-07-09 DIAGNOSIS — R519 Headache, unspecified: Secondary | ICD-10-CM

## 2016-07-09 DIAGNOSIS — E7800 Pure hypercholesterolemia, unspecified: Secondary | ICD-10-CM

## 2016-07-09 DIAGNOSIS — R531 Weakness: Secondary | ICD-10-CM

## 2016-07-09 DIAGNOSIS — F32A Depression, unspecified: Secondary | ICD-10-CM

## 2016-07-09 DIAGNOSIS — R197 Diarrhea, unspecified: Secondary | ICD-10-CM

## 2016-07-09 DIAGNOSIS — N898 Other specified noninflammatory disorders of vagina: Secondary | ICD-10-CM

## 2016-07-09 DIAGNOSIS — K635 Polyp of colon: Secondary | ICD-10-CM

## 2016-07-09 MED ORDER — NYSTATIN 100000 UNIT/GM EX CREA: 1.0000 "application " | TOPICAL_CREAM | Freq: Two times a day (BID) | CUTANEOUS | 0 refills | Status: DC

## 2016-07-09 NOTE — Progress Notes (Signed)
Patient ID: Carly [name], female   DOB: Mar 11, 1951, 65 y.o.   MRN: [id number]   Subjective:    Patient ID: Carly [name], female    DOB: 03-08-1951, 65 y.o.   MRN: [id number]  HPI  Patient here for a scheduled follow up.  Recently had shingles.  Weak from this.  Going to physical therapy.  She is seeing oncology for her MS.  Receiving infustions - ocrevus.  She is having some irritation in her vaginal area.  Localized to the outer vaginal area.  Noticed some previous diarrhea.  Will have some abdominal cramping.  Has a bowel movement, cramping resolves.  Was having 2-3 soft stools per day.  Discussed taking a probiotic. Saw GI.  Planning for colonoscopy.  Had to postpone until 09/2016 - since weak.     Past Medical History:  Diagnosis Date  . Allergy   . Depression   . Frequent headaches    H/O  . History of chicken pox   . History of colon polyps   . Hx of migraines   . Multiple sclerosis ([place])   . PONV (postoperative nausea and vomiting)    Past Surgical History:  Procedure Laterality Date  . FOOT SURGERY  2015  . GALLBLADDER SURGERY  2008  . HARDWARE REMOVAL Left 02/14/2016   Procedure: LEFT FOOT REMOVAL DEEP IMPLANT;  Surgeon: [name], MD;  Location: [facility];  Service: Orthopedics;  Laterality: Left;  . SPINE SURGERY  2014   Family History  Problem Relation Age of Onset  . Arthritis Mother   . Hypertension Mother   . Hypertension Father   . Hyperlipidemia Father   . Heart disease Maternal Grandfather   . Diabetes Maternal Grandfather   . Kidney disease Paternal Grandmother    Social History   Social History  . Marital status: Married    Spouse name: N/A  . Number of children: N/A  . Years of education: N/A   Social History Main Topics  . Smoking status: Never Smoker  . Smokeless tobacco: Never Used  . Alcohol use No  . Drug use: No  . Sexual activity: Not Asked   Other Topics Concern  . None   Social History Narrative  .  None    Outpatient Encounter Prescriptions as of 07/09/2016  Medication Sig  . acetaminophen (TYLENOL) 325 MG tablet Take 650 mg by mouth every 4 (four) hours as needed.  . ALPRAZolam (XANAX) 0.5 MG tablet Take by mouth. Reported on 04/09/2016  . ARIPiprazole (ABILIFY) 5 MG tablet Take 2.5 mg by mouth daily.  . B Complex Vitamins (VITAMIN B COMPLEX PO) Take by mouth.  Marland Kitchen CALCIUM PO Take 400 mg by mouth daily.  . Calcium-Phosphorus-Vitamin D (CITRACAL +D3 PO) Take by mouth. 2 tablets twice a day  . Cholecalciferol (D3-1000 PO) Take by mouth.  . Coenzyme Q10 (CO Q10) 100 MG CAPS Take by mouth.  . dalfampridine 10 MG TB12 Take 10 mg by mouth every 12 (twelve) hours.  . DULoxetine (CYMBALTA) 30 MG capsule Take 60 mg by mouth daily.  . fluticasone (FLONASE) 50 MCG/ACT nasal spray   . Multiple Vitamins-Minerals (CENTRUM SILVER ULTRA WOMENS PO) Take by mouth.  . pseudoephedrine (SUDAFED) 30 MG tablet Take 30 mg by mouth every 6 (six) hours as needed for congestion.  . sertraline (ZOLOFT) 100 MG tablet Take 200 mg by mouth daily.  . sodium chloride (V-R NASAL SPRAY SALINE) 0.65 % nasal spray Place into the nose.  Marland Kitchen  vitamin B-12 (CYANOCOBALAMIN) 1000 MCG tablet Take 1,000 mcg by mouth daily.  Marland Kitchen nystatin cream (MYCOSTATIN) Apply 1 application topically 2 (two) times daily.  Marland Kitchen ocrelizumab (OCREVUS) 300 MG/10ML injection Inject into the vein every 6 (six) months.   Facility-Administered Encounter Medications as of 07/09/2016  Medication  . 0.9 %  sodium chloride infusion  . acetaminophen (TYLENOL) tablet 650 mg    Review of Systems  Constitutional: Positive for fatigue. Negative for appetite change and unexpected weight change.  HENT: Negative for congestion and sinus pressure.   Respiratory: Negative for cough, chest tightness and shortness of breath.   Cardiovascular: Negative for chest pain, palpitations and leg swelling.  Gastrointestinal: Positive for diarrhea. Negative for abdominal pain,  nausea and vomiting.  Genitourinary: Negative for difficulty urinating and dysuria.  Musculoskeletal: Negative for back pain and joint swelling.  Skin: Negative for color change and rash.  Neurological: Negative for dizziness, light-headedness and headaches.  Psychiatric/Behavioral: Negative for agitation and dysphoric mood.       Objective:    Physical Exam  Constitutional: She appears well-developed and well-nourished. No distress.  HENT:  Nose: Nose normal.  Mouth/Throat: Oropharynx is clear and moist.  Neck: Neck supple. No thyromegaly present.  Cardiovascular: Normal rate and regular rhythm.   Pulmonary/Chest: Breath sounds normal. No respiratory distress. She has no wheezes.  Abdominal: Soft. Bowel sounds are normal. There is no tenderness.  Genitourinary:  Genitourinary Comments: Some perivaginal erythema - minimal.    Musculoskeletal: She exhibits no edema or tenderness.  Lymphadenopathy:    She has no cervical adenopathy.  Skin: No rash noted. No erythema.  Psychiatric: She has a normal mood and affect.    BP 132/80   Pulse 98   Temp 98.2 F (36.8 C) (Oral)   Ht 5\' 3"  (1.6 m)   Wt 168 lb 12.8 oz (76.6 kg)   SpO2 96%   BMI 29.90 kg/m  Wt Readings from Last 3 Encounters:  07/09/16 168 lb 12.8 oz (76.6 kg)  04/09/16 168 lb 5.1 oz (76.4 kg)  02/14/16 165 lb (74.8 kg)     Lab Results  Component Value Date   WBC 7.6 12/20/2015   HGB 13.6 12/20/2015   HCT 39.7 12/20/2015   PLT 285.0 12/20/2015   GLUCOSE 106 (H) 07/15/2016   CHOL 255 (H) 07/15/2016   TRIG 173.0 (H) 07/15/2016   HDL 73.10 07/15/2016   LDLDIRECT 138.0 12/20/2015   LDLCALC 148 (H) 07/15/2016   ALT 19 07/15/2016   AST 20 07/15/2016   NA 141 07/15/2016   K 4.1 07/15/2016   CL 104 07/15/2016   CREATININE 0.69 07/15/2016   BUN 8 07/15/2016   CO2 27 07/15/2016   TSH 1.31 12/20/2015   HGBA1C 5.6 07/15/2016    Dg Bone Density  Result Date: 06/10/2016 EXAM: DUAL X-RAY ABSORPTIOMETRY (DXA)  FOR BONE MINERAL DENSITY IMPRESSION: Dear Dr.Caroline Longie, Your patient Carly [name] completed a BMD test on 06/10/2016 using the [place] (analysis version: 14.10) manufactured by [employer]. The following summarizes the results of our evaluation. PATIENT BIOGRAPHICAL: Name: Aylee, Vasta Patient ID: [id number] Birth Date: 02-Jul-1951 Height: 62.5 in. Gender: Female Exam Date: 06/10/2016 Weight: 168.8 lbs. Indications: Caucasian, Height Loss, History of Fracture (Adult), History of Spinal Surgery, MS, Postmenopausal, Scoliosis Fractures: Right humerus Treatments: CALCIUM VIT D, Flonase, Multi-Vitamin with calcium, Ocrevus ASSESSMENT: The BMD measured at Femur Neck Right is 0.784 g/cm2 with a T-score of -1.8. This patient is considered osteopenic according to World  Health Organization [facility]) criteria. Lumbar spine was not utilized due to surgical hardware. Site Region Measured Measured WHO Young Adult BMD Date       Age      Classification T-score DualFemur Neck Right 06/10/2016 65.9 Osteopenia -1.8 0.784 g/cm2 DualFemur Neck Right 07/28/2012 62.0 Osteopenia -1.6 0.818 g/cm2 DualFemur Neck Right 07/15/2011 60.9 Osteopenia -1.6 0.820 g/cm2 Left Forearm Radius 33% 06/10/2016 65.9 Normal -0.3 0.850 g/cm2 Left Forearm Radius 33% 07/28/2012 62.0 Normal -0.2 0.857 g/cm2 Left Forearm Radius 33% 07/15/2011 60.9 Normal -0.3 0.852 g/cm2 World Health Organization [facility]) criteria for post-menopausal, Caucasian Women: Normal:       T-score at or above -1 SD Osteopenia:   T-score between -1 and -2.5 SD Osteoporosis: T-score at or below -2.5 SD RECOMMENDATIONS: [facility] recommends that FDA-approved medical therapies be considered in postmenopausal women and men age 81 or older with a: 1. Hip or vertebral (clinical or morphometric) fracture. 2. T-score of < -2.5 at the spine or hip. 3. Ten-year fracture probability by FRAX of 3% or greater for hip fracture or 20% or greater for major  osteoporotic fracture. All treatment decisions require clinical judgment and consideration of individual patient factors, including patient preferences, co-morbidities, previous drug use, risk factors not captured in the FRAX model (e.g. falls, vitamin D deficiency, increased bone turnover, interval significant decline in bone density) and possible under - or over-estimation of fracture risk by FRAX. All patients should ensure an adequate intake of dietary calcium (1200 mg/d) and vitamin D (800 IU daily) unless contraindicated. FOLLOW-UP: People with diagnosed cases of osteoporosis or at high risk for fracture should have regular bone mineral density tests. For patients eligible for Medicare, routine testing is allowed once every 2 years. The testing frequency can be increased to one year for patients who have rapidly progressing disease, those who are receiving or discontinuing medical therapy to restore bone mass, or have additional risk factors. I have reviewed this report, and agree with the above findings. [facility] Dear Dr.Dorreen Valiente, Your patient RAKIYA BUCHBERGER completed a FRAX assessment on 06/10/2016 using the [place] (analysis version: 14.10) manufactured by [employer]. The following summarizes the results of our evaluation. PATIENT BIOGRAPHICAL: Name: Lennox, Kantor Patient ID: [id number] Birth Date: [birth year] Height:    62.5 in. Gender:     Female    [age]       Weight:    168.8 lbs. Ethnicity:  White                            Exam Date: 06/10/2016 FRAX* RESULTS:  (version: 3.5) 10-year Probability of Fracture1 Major Osteoporotic Fracture2 Hip Fracture 16.3% 2.2% Population: [place] (Caucasian) Risk Factors: History of Fracture (Adult) Based on Femur (Right) Neck BMD 1 -The 10-year probability of fracture may be lower than reported if the patient has received treatment. 2 -Major Osteoporotic Fracture: Clinical Spine, Forearm, Hip or Shoulder *FRAX is a Materials engineer  of the [employer] for Metabolic Bone Disease, a [place] (WHO) [facility]. ASSESSMENT: The probability of a major osteoporotic fracture is 16.3% within the next ten years. The probability of a hip fracture is 2.2% within the next ten years. . Electronically Signed   By: Lowella Grip III M.D.   On: 06/10/2016 11:15       Assessment & Plan:   Problem List Items Addressed  This Visit    Colon polyp    Colonoscopy 2012.  Recommended f/u in 2017.  Saw GI.  Planning for colonoscopy in 09/2016.       Depression    Followed by Dr Nicolasa Ducking.  Stable.       Headache    No a significant issue for her now.  Follow.        Hypercholesterolemia    Low cholesterol diet and exercise.  Follow lipid panel.        Relevant Orders   Lipid panel   Multiple sclerosis ([place])    Followed by Dr Mike Gip.  Receiving ocrevus.       Relevant Medications   ocrelizumab (OCREVUS) 300 MG/10ML injection   Unsteady gait    Going to therapy.  Appears to be doing better.  Follow.         Other Visit Diagnoses    Weakness    -  Primary   goinig to therapy.  gait has improved.  check B12.  follow.     Relevant Orders   Hepatic function panel   Basic metabolic panel   Vitamin [id number]   Diarrhea, unspecified type       soft stool as outlined.  probiotic as directed.  follow.  saw GI.  planning for colonoscopy.     Vaginal irritation       nystatin cream as directed.     Hyperglycemia       Relevant Orders   Hemoglobin A1c       Einar Pheasant, MD

## 2016-07-09 NOTE — Patient Instructions (Signed)
Probiotics:  Align, florastor, or culturelle

## 2016-07-09 NOTE — Progress Notes (Signed)
Pre visit review using our clinic review tool, if applicable. No additional management support is needed unless otherwise documented below in the visit note. 

## 2016-07-11 ENCOUNTER — Telehealth: Payer: Self-pay | Admitting: Internal Medicine

## 2016-07-11 ENCOUNTER — Ambulatory Visit: Payer: Medicare Other | Admitting: Physical Therapy

## 2016-07-11 NOTE — Telephone Encounter (Signed)
I would continue the probiotic.  If she is still having diarrhea, I can order stool studies and/or refer to GI.  Let me know if agreeable.

## 2016-07-11 NOTE — Telephone Encounter (Signed)
Patient stated that she believes her stomach issue is viral.  She would also like to know when she should stop the nystatin. Pt contact [phone].  Please return pt call at 4 pm

## 2016-07-11 NOTE — Telephone Encounter (Signed)
Left  Message for patient to return call to office. 

## 2016-07-11 NOTE — Telephone Encounter (Signed)
Patient feels the diarrhea is viral , and would like to wait until Monday, and see if symptoms resolve. Patient ask how long to nystatin advised usually until the rash clears, patient stated rash better but has not completely cleared but was advised to call back on Monday if no better. FYI

## 2016-07-11 NOTE — Telephone Encounter (Signed)
  Patient having 2 -3 times daily loose stools , patient has recently start cultural,  Patient seen by PCP on 07/09/16.  The stools are not watery, and no change in color no change in odor. Patient would like to know what she can take to help patient has been taking Pepto bismol.

## 2016-07-11 NOTE — Telephone Encounter (Signed)
Pt called about now having loose stool and wanted to know what can she take? It started on Monday 09/25. After she eats breakfast it starts but pt stated that it's the same food that she always eats. Pt has a headache today with it. No vomiting.  Please advise?  Call pt @ [phone]. Thank you!

## 2016-07-14 ENCOUNTER — Encounter: Payer: Self-pay | Admitting: Physical Therapy

## 2016-07-14 ENCOUNTER — Telehealth: Payer: Self-pay

## 2016-07-14 ENCOUNTER — Ambulatory Visit: Payer: Medicare Other | Attending: Psychiatry | Admitting: Physical Therapy

## 2016-07-14 DIAGNOSIS — M6281 Muscle weakness (generalized): Secondary | ICD-10-CM | POA: Diagnosis not present

## 2016-07-14 DIAGNOSIS — R262 Difficulty in walking, not elsewhere classified: Secondary | ICD-10-CM

## 2016-07-14 DIAGNOSIS — R739 Hyperglycemia, unspecified: Secondary | ICD-10-CM

## 2016-07-14 DIAGNOSIS — E7800 Pure hypercholesterolemia, unspecified: Secondary | ICD-10-CM

## 2016-07-14 NOTE — Telephone Encounter (Signed)
Pt coming for fasting labs 07/15/2016. Please place future orders. Thank you.

## 2016-07-14 NOTE — Telephone Encounter (Signed)
Orders placed for labs

## 2016-07-14 NOTE — Therapy (Signed)
[facility] [address] [place], [address] Phone: [phone]   Fax:  [phone]  Physical Therapy Treatment  Patient Details  Name: Carly [name] MRN: [id number] Date of Birth: 1950/12/19 Referring Provider: Effie Shy MD  Encounter Date: 07/14/2016      PT End of Session - 07/14/16 1036    Visit Number 6   Number of Visits 12   Authorization Type 6   Authorization Time Period 10 (G code)   PT Start Time 1031   PT Stop Time 1115   PT Time Calculation (min) 44 min   Activity Tolerance Patient tolerated treatment well   Behavior During Therapy Anxious;WFL for tasks assessed/performed      Past Medical History:  Diagnosis Date  . Allergy   . Depression   . Frequent headaches    H/O  . History of chicken pox   . History of colon polyps   . Hx of migraines   . Multiple sclerosis ([place])   . PONV (postoperative nausea and vomiting)     Past Surgical History:  Procedure Laterality Date  . FOOT SURGERY  2015  . GALLBLADDER SURGERY  2008  . HARDWARE REMOVAL Left 02/14/2016   Procedure: LEFT FOOT REMOVAL DEEP IMPLANT;  Surgeon: [name], MD;  Location: [facility];  Service: Orthopedics;  Laterality: Left;  . SPINE SURGERY  2014    There were no vitals filed for this visit.      Subjective Assessment - 07/14/16 1033    Subjective Patient reports she was able to walk without cane all day yesterday at home and then her back was hurting her.    Limitations Standing;Walking   Patient Stated Goals Patient would like to improve confidence and walk without AD   Currently in Pain? No/denies      Objective: Gait: ambulating into clinic using SPC in right UE and husband supporting her on let side Tone: increased tone left LE with effort  Treatment: Therapeutic exercise:patient performed exercises with guidance, verbal and tactile cues and demonstration of PT: Supine  lying: physioball (45 cm)  for hip and knee flexion and LTR with assistance of therapist x 20 reps each Hip fall outs left and right hip for ER in hook lying position, 15 reps each Bridging with ball between knees 2 x 10 reps with cuing for stabilization Bilateral shoulder flexion with 3# weight overhead 2 x 10 Sitting: Hip adduction with glute sets x 10 Hip abduction with manual resistance given by therapist, moderate resistance, x 10 with 5 second holds end range Knee extension 2# ankle weight 1 x 15 reps each LE Knee flexion red resistive band 1 x  25 reps each LE Red resistive band for scapular rows low and straight arm pull downs 2 x 15 reps each Reach out to side with weight shifting in sitting on treatment table x 15 reps Standing at back of chair (for support) performed weight shifting: marching, side to side stepping,  X 1 min  Patient response to treatment:  Patient demonstrated improved posture and technique with repeated VC and demonstration, mild fatigue noted with exercises           PT Education - 07/14/16 1035    Education provided Yes   Education Details modification of walking and using resistive bands at home to not fatigue her muslces to the point she is having increased pain and decreased mobility   Person(s) Educated Patient  Methods Explanation;Demonstration;Verbal cues   Comprehension Verbalized understanding;Verbal cues required;Returned demonstration             PT Long Term Goals - 06/10/16 1855      PT LONG TERM GOAL #1   Title pt's gait speed will be at least 0.8 m/s with LRAD for full community ambulation by 07/22/2016   Baseline to be assessed   Status New     PT LONG TERM GOAL #2   Title pt's ABC scale score will improve by at least 10 points by 07/22/2016 indicating improved confidence with daily tasks at home and in community   Baseline 50%   Status New     PT LONG TERM GOAL #3   Title Patient will be independent with home  program for strength and endurance in order to transition to self management once discharged from physical therapy 07/22/2016   Baseline requires maximal cuing and assistance to perform exercises   Status New               Plan - 07/14/16 1115    Clinical Impression Statement Patient demonstrated improved endurance and strength as indicated with incresaed repetitions and resistance with exercises. She continues with limited strength, increased tone in left LE and will require additional physical therapy intervention to achive goals.    Rehab Potential Fair   PT Frequency 2x / week   PT Duration 6 weeks   PT Treatment/Interventions Patient/family education;Gait training;Neuromuscular re-education;Therapeutic exercise;Manual techniques   PT Next Visit Plan exercises for strength, endurance, walking   PT Home Exercise Plan strengthening exercises for LE's, core control      Patient will benefit from skilled therapeutic intervention in order to improve the following deficits and impairments:  Decreased strength, Decreased balance, Decreased activity tolerance, Decreased endurance, Difficulty walking  Visit Diagnosis: Muscle weakness (generalized)  Difficulty in walking, not elsewhere classified     Problem List Patient Active Problem List   Diagnosis Date Noted  . Lower extremity edema 01/13/2016  . Bilateral ovarian cysts 10/16/2015  . Health care maintenance 10/16/2015  . Sinusitis 03/12/2015  . Colon polyp 01/25/2015  . Bone/cartilage disorder 01/25/2015  . Headache 11/18/2014  . Depression 11/18/2014  . Greater tuberosity of humerus fracture 11/18/2014  . Multiple sclerosis ([place]) 11/18/2014  . Unsteady gait 11/18/2014  . Dizziness 11/18/2014  . Inconclusive mammogram 03/15/2013  . Back ache 09/06/2012  . BP (high blood pressure) 09/06/2012  . HLD (hyperlipidemia) 01/28/2012  . Climacteric 01/28/2012  . Routine general medical examination at a health care facility  01/28/2012  . Avitaminosis D 01/28/2012    Jomarie Longs PT 07/14/2016, 2:35 PM  [facility] [address], [place], [address] Phone: [phone]   Fax:  [phone]  Name: TAYTUM LEGACY MRN: [id number] Date of Birth: 1950-12-17

## 2016-07-15 ENCOUNTER — Other Ambulatory Visit (INDEPENDENT_AMBULATORY_CARE_PROVIDER_SITE_OTHER): Payer: Medicare Other

## 2016-07-15 DIAGNOSIS — E7800 Pure hypercholesterolemia, unspecified: Secondary | ICD-10-CM | POA: Diagnosis not present

## 2016-07-15 DIAGNOSIS — R739 Hyperglycemia, unspecified: Secondary | ICD-10-CM | POA: Diagnosis not present

## 2016-07-15 LAB — LIPID PANEL
CHOL/HDL RATIO: 3
CHOLESTEROL: 255 mg/dL — AB (ref 0–200)
HDL: 73.1 mg/dL (ref >39.00)
LDL CALC: 148 mg/dL — AB (ref 0–99)
NonHDL: 182.21
TRIGLYCERIDES: 173 mg/dL — AB (ref 0.0–149.0)
VLDL: 34.6 mg/dL (ref 0.0–40.0)

## 2016-07-15 LAB — BASIC METABOLIC PANEL
BUN: 8 mg/dL (ref 6–23)
CALCIUM: 9.6 mg/dL (ref 8.4–10.5)
CO2: 27 mEq/L (ref 19–32)
Chloride: 104 mEq/L (ref 96–112)
Creatinine, Ser: 0.69 mg/dL (ref 0.40–1.20)
GFR: 90.47 mL/min (ref >60.00)
Glucose, Bld: 106 mg/dL — ABNORMAL HIGH (ref 70–99)
POTASSIUM: 4.1 meq/L (ref 3.5–5.1)
SODIUM: 141 meq/L (ref 135–145)

## 2016-07-15 LAB — HEPATIC FUNCTION PANEL
ALBUMIN: 4.5 g/dL (ref 3.5–5.2)
ALT: 19 U/L (ref 0–35)
AST: 20 U/L (ref 0–37)
Alkaline Phosphatase: 73 U/L (ref 39–117)
Bilirubin, Direct: 0.1 mg/dL (ref 0.0–0.3)
TOTAL PROTEIN: 7.3 g/dL (ref 6.0–8.3)
Total Bilirubin: 0.4 mg/dL (ref 0.2–1.2)

## 2016-07-15 LAB — HEMOGLOBIN A1C: HEMOGLOBIN A1C: 5.6 % (ref 4.6–6.5)

## 2016-07-16 ENCOUNTER — Encounter: Payer: Self-pay | Admitting: Physical Therapy

## 2016-07-16 ENCOUNTER — Ambulatory Visit: Payer: Medicare Other | Admitting: Physical Therapy

## 2016-07-16 ENCOUNTER — Other Ambulatory Visit: Payer: Self-pay | Admitting: Internal Medicine

## 2016-07-16 ENCOUNTER — Telehealth: Payer: Self-pay | Admitting: Internal Medicine

## 2016-07-16 DIAGNOSIS — M6281 Muscle weakness (generalized): Secondary | ICD-10-CM | POA: Diagnosis not present

## 2016-07-16 DIAGNOSIS — E7800 Pure hypercholesterolemia, unspecified: Secondary | ICD-10-CM

## 2016-07-16 DIAGNOSIS — R262 Difficulty in walking, not elsewhere classified: Secondary | ICD-10-CM

## 2016-07-16 MED ORDER — ROSUVASTATIN CALCIUM 5 MG PO TABS: 5.0000 mg | ORAL_TABLET | Freq: Every day | ORAL | 1 refills | Status: DC

## 2016-07-16 NOTE — Progress Notes (Signed)
Order placed for f/u liver panel.  

## 2016-07-16 NOTE — Telephone Encounter (Signed)
Pt called returning your call. Thank you!  Call pt @ [phone]

## 2016-07-16 NOTE — Progress Notes (Signed)
rx sent in for crestor 5mg  #30 with one refill.

## 2016-07-17 ENCOUNTER — Ambulatory Visit: Payer: Medicare Other | Admitting: Internal Medicine

## 2016-07-17 NOTE — Therapy (Signed)
[facility] [address] [place], [address] Phone: [phone]   Fax:  [phone]  Physical Therapy Treatment  Patient Details  Name: Carly [name] MRN: [id number] Date of Birth: September 04, 1951 Referring Provider: Effie Shy MD  Encounter Date: 07/16/2016      PT End of Session - 07/16/16 1100    Visit Number 7   Number of Visits 12   Date for PT Re-Evaluation 07/22/16   Authorization Type 7   Authorization Time Period 10 (G code)   PT Start Time 1030   PT Stop Time 1100   PT Time Calculation (min) 30 min   Activity Tolerance Patient limited by fatigue   Behavior During Therapy Anxious;WFL for tasks assessed/performed      Past Medical History:  Diagnosis Date  . Allergy   . Depression   . Frequent headaches    H/O  . History of chicken pox   . History of colon polyps   . Hx of migraines   . Multiple sclerosis ([place])   . PONV (postoperative nausea and vomiting)     Past Surgical History:  Procedure Laterality Date  . FOOT SURGERY  2015  . GALLBLADDER SURGERY  2008  . HARDWARE REMOVAL Left 02/14/2016   Procedure: LEFT FOOT REMOVAL DEEP IMPLANT;  Surgeon: [name], MD;  Location: [facility];  Service: Orthopedics;  Laterality: Left;  . SPINE SURGERY  2014    There were no vitals filed for this visit.      Subjective Assessment - 07/16/16 1033    Subjective Patient reports she was sore and tired yesterday, like the flu. She is still not feeling completely well today.    Limitations Standing;Walking   Patient Stated Goals Patient would like to improve confidence and walk without AD   Currently in Pain? No/denies     Objective: Gait; ambulating into clinic, slow cadence, unsteady gait, leaning on husband and using SPC in opposite hand (right) Temperature taken orally pre treatment: 98.5 degrees   Treatment:  Therapeutic exercise: patient performed exercises  with guidance, verbal and tactile cues and demonstration of PT: Sitting;  Hip adduction with ball between knees with glute sets x 10 Roll ball underfoot for AAROM knees 25 x each LE Supine lying: Hook lying: ER short arc each LE x 10 reps (controlled knee fall outs) Hamstring stretch with assistance of PT; 3 x 20 seconds each LE AAROM; each LE hip flexion, ER, IR 10 reps each LTR with and without ball x 20 reps each Isometric ER in hook lying with manual resistance of therapist moderate intensity x 10 reps each LE Ball held between hands: bilateral flexion overhead with TrA contraction, hook lying   Patient response to treatment: improved flexibility in both LE's and patient reported being able to walk with less stiffness and decreased support of therapist at end of session         PT Education - 07/16/16 1055    Education provided Yes   Education Details HEP; educated to work on stretches, ROM when fatigued and just tired. if sick then rest   Person(s) Educated Patient   Methods Explanation;Demonstration;Verbal cues   Comprehension Verbalized understanding;Returned demonstration;Verbal cues required             PT Long Term Goals - 06/10/16 1855      PT LONG TERM GOAL #1   Title pt's gait speed will be at least 0.8 m/s with  LRAD for full community ambulation by 07/22/2016   Baseline to be assessed   Status New     PT LONG TERM GOAL #2   Title pt's ABC scale score will improve by at least 10 points by 07/22/2016 indicating improved confidence with daily tasks at home and in community   Baseline 50%   Status New     PT LONG TERM GOAL #3   Title Patient will be independent with home program for strength and endurance in order to transition to self management once discharged from physical therapy 07/22/2016   Baseline requires maximal cuing and assistance to perform exercises   Status New               Plan - 07/16/16 1102    Clinical Impression Statement  Limited session due to fatigue and patient reporting not feeling well. She was able to complete all exercises with assistance of therapist and demonstrated improved gait pattern at end of session. She verbalized good understanding of modifications to exercise when she is tired versus sick.   Rehab Potential Fair   PT Frequency 2x / week   PT Duration 6 weeks   PT Next Visit Plan exercises for strength, endurance, walking   PT Home Exercise Plan strengthening exercises for LE's, core control      Patient will benefit from skilled therapeutic intervention in order to improve the following deficits and impairments:  Decreased strength, Decreased balance, Decreased activity tolerance, Decreased endurance, Difficulty walking  Visit Diagnosis: Muscle weakness (generalized)  Difficulty in walking, not elsewhere classified     Problem List Patient Active Problem List   Diagnosis Date Noted  . Lower extremity edema 01/13/2016  . Bilateral ovarian cysts 10/16/2015  . Health care maintenance 10/16/2015  . Sinusitis 03/12/2015  . Colon polyp 01/25/2015  . Bone/cartilage disorder 01/25/2015  . Headache 11/18/2014  . Depression 11/18/2014  . Greater tuberosity of humerus fracture 11/18/2014  . Multiple sclerosis ([place]) 11/18/2014  . Unsteady gait 11/18/2014  . Dizziness 11/18/2014  . Inconclusive mammogram 03/15/2013  . Back ache 09/06/2012  . BP (high blood pressure) 09/06/2012  . HLD (hyperlipidemia) 01/28/2012  . Climacteric 01/28/2012  . Routine general medical examination at a health care facility 01/28/2012  . Avitaminosis D 01/28/2012    Jomarie Longs PT 07/17/2016, 2:42 PM  [facility] [address] [place], [address] Phone: [phone]   Fax:  [phone]  Name: Carly [name] MRN: [id number] Date of Birth: 1951-05-11

## 2016-07-18 ENCOUNTER — Telehealth: Payer: Self-pay | Admitting: Internal Medicine

## 2016-07-18 NOTE — Telephone Encounter (Signed)
Pt called and was wondering if Dr. Nicki Reaper would hold off on prescribing Crestor. Pt says that she has been unable to exercise because she has shingles.  Also worried regarding the side effects with the medication causing walking problems. Is there possibly a nature route to go? Please advise. Thank you!  Call pt @ [phone]

## 2016-07-18 NOTE — Telephone Encounter (Signed)
Ok.  Can hold on crestor for now.  Work on diet and exercise.  Ok to cancel rx.  We will follow.

## 2016-07-18 NOTE — Telephone Encounter (Signed)
Patient notified

## 2016-07-18 NOTE — Telephone Encounter (Signed)
Please advise 

## 2016-07-20 ENCOUNTER — Encounter: Payer: Self-pay | Admitting: Internal Medicine

## 2016-07-20 NOTE — Assessment & Plan Note (Signed)
No a significant issue for her now.  Follow.

## 2016-07-20 NOTE — Assessment & Plan Note (Signed)
Low cholesterol diet and exercise.  Follow lipid panel.   

## 2016-07-20 NOTE — Assessment & Plan Note (Signed)
Colonoscopy 2012.  Recommended f/u in 2017.  Saw GI.  Planning for colonoscopy in 09/2016.

## 2016-07-20 NOTE — Assessment & Plan Note (Signed)
Followed by Dr Kapur.  Stable.   

## 2016-07-20 NOTE — Assessment & Plan Note (Signed)
Followed by Dr Corcoran.  Receiving ocrevus.   

## 2016-07-20 NOTE — Assessment & Plan Note (Signed)
Going to therapy.  Appears to be doing better.  Follow.

## 2016-07-21 ENCOUNTER — Encounter: Payer: Medicare Other | Admitting: Physical Therapy

## 2016-07-22 ENCOUNTER — Encounter: Payer: Medicare Other | Admitting: Physical Therapy

## 2016-07-23 ENCOUNTER — Encounter: Payer: Self-pay | Admitting: Physical Therapy

## 2016-07-23 ENCOUNTER — Ambulatory Visit: Payer: Medicare Other | Admitting: Physical Therapy

## 2016-07-23 DIAGNOSIS — M6281 Muscle weakness (generalized): Secondary | ICD-10-CM

## 2016-07-23 DIAGNOSIS — R262 Difficulty in walking, not elsewhere classified: Secondary | ICD-10-CM

## 2016-07-24 NOTE — Therapy (Signed)
[facility] [address], [place], [address] Phone: [phone]   Fax:  [phone]  Physical Therapy Treatment  Patient Details  Name: Carly [name] MRN: [id number] Date of Birth: [birth year] Referring Provider: Effie Shy MD  Encounter Date: 07/23/2016      PT End of Session - 07/23/16 0948    Visit Number 8   Number of Visits 24   Date for PT Re-Evaluation 09/04/16   Authorization Type 8   Authorization Time Period 10 (G code)   PT Start Time 0908   PT Stop Time 0940   PT Time Calculation (min) 32 min   Activity Tolerance Patient limited by fatigue   Behavior During Therapy Anxious;WFL for tasks assessed/performed      Past Medical History:  Diagnosis Date  . Allergy   . Depression   . Frequent headaches    H/O  . History of chicken pox   . History of colon polyps   . Hx of migraines   . Multiple sclerosis ([place])   . PONV (postoperative nausea and vomiting)     Past Surgical History:  Procedure Laterality Date  . FOOT SURGERY  2015  . GALLBLADDER SURGERY  2008  . HARDWARE REMOVAL Left 02/14/2016   Procedure: LEFT FOOT REMOVAL DEEP IMPLANT;  Surgeon: [name], MD;  Location: [facility];  Service: Orthopedics;  Laterality: Left;  . SPINE SURGERY  2014    There were no vitals filed for this visit.      Subjective Assessment - 07/23/16 0909    Subjective hot and humid weather challenges this morning, Has to leave in 30 min. due to husband having appointment at 10 this morning.    Limitations Standing;Walking   Patient Stated Goals Patient would like to improve confidence and walk without AD   Currently in Pain? No/denies      Objective: Gait; ambulating into clinic, slow cadence, unsteady gait, leaning on husband and using SPC in opposite hand (right) Tone: increased left LE    Treatment:  Therapeutic exercise: patient performed exercises with  guidance, verbal and tactile cues and demonstration of PT: Sitting;  Hip adduction with ball between knees with glute sets x 10 Roll ball underfoot for AAROM knees 25 x right LE, 15x left LE with increased tone limiting additional repetitions Supine lying: Hamstring stretch with assistance of PT; 3 x 20 seconds each LE AAROM; each LE hip flexion, ER, IR and hip abduction/adduction 10 reps each LTR with and with ball x 20 reps each Hip and knee flexion with 45cm ball and assistance of therapist Hook lying with ball between knees and pelvic tilts x 10 Isometric ER in hook lying with manual resistance of therapist moderate intensity x 10 reps each LE Ball held between hands: bilateral flexion overhead with TrA contraction, hook lying x 15 reps   Patient response to treatment: Patient demonstrated improved hip flexibility, hamstring flexibility and strength in LE's. She continued with difficulty with walking due to tone changes in left LE with standing and moving        PT Education - 07/23/16 0910    Education provided Yes   Education Details HEP: discussed resting and performing more stretching rather than stregthening when dealing with hot, humid weather   Person(s) Educated Patient   Methods Explanation;Demonstration;Verbal cues   Comprehension Verbalized understanding;Returned demonstration;Verbal cues required             PT Long  Term Goals - 07/23/16 1000      PT LONG TERM GOAL #1   Title pt's gait speed will be at least 0.8 m/s with LRAD for full community ambulation by 09/04/2016   Baseline to be assessed; deferred due to increased fatigue   Status Revised     PT LONG TERM GOAL #2   Title pt's ABC scale score will improve by at least 10 points by 09/04/2016 indicating improved confidence with daily tasks at home and in community   Baseline 50%   Status Revised     PT LONG TERM GOAL #3   Title Patient will be independent with home program for strength and endurance  in order to transition to self management once discharged from physical therapy 09/04/2016   Baseline requires moderate cuing and assistance to perform exercises and guidance for modifications   Status Revised               Plan - 07/23/16 0945    Clinical Impression Statement Session limited due to time contraints, patient with increased fatigue today most likely due to hot, humid weather affecting her MS. Overall she is improving strength and is able to walk better at home and continues to require assistance for safetly and balance when outside the home. She is progressing slowly due to s/p shingles weakness, MS.    Rehab Potential Fair   PT Frequency 2x / week   PT Duration 6 weeks   PT Treatment/Interventions Patient/family education;Gait training;Neuromuscular re-education;Therapeutic exercise;Manual techniques   PT Next Visit Plan exercises for strength, endurance, walking; re assess goals, outcome measures    PT Home Exercise Plan strengthening exercises for LE's, core control      Patient will benefit from skilled therapeutic intervention in order to improve the following deficits and impairments:  Decreased strength, Decreased balance, Decreased activity tolerance, Decreased endurance, Difficulty walking  Visit Diagnosis: Muscle weakness (generalized) - Plan: PT plan of care cert/re-cert  Difficulty in walking, not elsewhere classified - Plan: PT plan of care cert/re-cert     Problem List Patient Active Problem List   Diagnosis Date Noted  . Lower extremity edema 01/13/2016  . Bilateral ovarian cysts 10/16/2015  . Health care maintenance 10/16/2015  . Sinusitis 03/12/2015  . Colon polyp 01/25/2015  . Bone/cartilage disorder 01/25/2015  . Headache 11/18/2014  . Depression 11/18/2014  . Greater tuberosity of humerus fracture 11/18/2014  . Multiple sclerosis ([place]) 11/18/2014  . Unsteady gait 11/18/2014  . Dizziness 11/18/2014  . Inconclusive mammogram 03/15/2013   . Back ache 09/06/2012  . BP (high blood pressure) 09/06/2012  . Hypercholesterolemia 01/28/2012  . Climacteric 01/28/2012  . Routine general medical examination at a health care facility 01/28/2012  . Avitaminosis D 01/28/2012    Jomarie Longs PT 07/24/2016, 4:33 PM  [facility] [address], [place], [address] Phone: [phone]   Fax:  [phone]  Name: NEISA FUGATE MRN: [id number] Date of Birth: 1951-05-26

## 2016-07-25 ENCOUNTER — Ambulatory Visit: Payer: Medicare Other | Admitting: Physical Therapy

## 2016-07-25 ENCOUNTER — Encounter: Payer: Self-pay | Admitting: Physical Therapy

## 2016-07-25 DIAGNOSIS — M6281 Muscle weakness (generalized): Secondary | ICD-10-CM

## 2016-07-25 DIAGNOSIS — R262 Difficulty in walking, not elsewhere classified: Secondary | ICD-10-CM

## 2016-07-25 NOTE — Therapy (Signed)
[facility] [address], [place], [address] Phone: [phone]   Fax:  [phone]  Physical Therapy Treatment  Patient Details  Name: Carly [name] MRN: [id number] Date of Birth: 1950-11-19 Referring Provider: Effie Shy MD  Encounter Date: 07/25/2016      PT End of Session - 07/25/16 1300    Visit Number 9   Number of Visits 24   Date for PT Re-Evaluation 09/04/16   Authorization Type 9   Authorization Time Period 10 (G code)   PT Start Time 1156   PT Stop Time 1243   PT Time Calculation (min) 47 min   Activity Tolerance Patient limited by fatigue   Behavior During Therapy Anxious;WFL for tasks assessed/performed      Past Medical History:  Diagnosis Date  . Allergy   . Depression   . Frequent headaches    H/O  . History of chicken pox   . History of colon polyps   . Hx of migraines   . Multiple sclerosis ([place])   . PONV (postoperative nausea and vomiting)     Past Surgical History:  Procedure Laterality Date  . FOOT SURGERY  2015  . GALLBLADDER SURGERY  2008  . HARDWARE REMOVAL Left 02/14/2016   Procedure: LEFT FOOT REMOVAL DEEP IMPLANT;  Surgeon: [name], MD;  Location: [facility];  Service: Orthopedics;  Laterality: Left;  . SPINE SURGERY  2014    There were no vitals filed for this visit.      Subjective Assessment - 07/25/16 1201    Subjective Patient reports she is having increased swelling in left lower leg and foot today. She feels this may be due to eating salty food yesterday; she will monitor this and discuss with MD as needed    Limitations Standing;Walking   Patient Stated Goals Patient would like to improve confidence and walk without AD   Currently in Pain? Yes   Pain Score 3    Pain Location Foot   Pain Orientation Left   Pain Descriptors / Indicators Aching   Pain Type Acute pain   Pain Onset 1 to 4 weeks ago   Pain Frequency  Intermittent      Objective: Gait; with assistance for balance/support x 1, using SPC right hand, slow cautious speed Tone: increased left LE    Treatment:  Therapeutic exercise: patient performed exercises with verbal, tactile cues and demonstration of therapist: Sitting;  Hip adduction with ball between knees with glute sets x 10 Resistive band scapular rows x 15 reps, straight arm pull downs x 15 reps Supine lying: Hamstring stretch with assistance of PT; 3 x 20 seconds each LE AAROM; each LE hip flexion and extension with isometric knee flexion at end rang x 10 reps each LE LTR  with ball x 20 reps each Hip and knee flexion with 45cm ball and assistance of therapist x 20 reps, with both LE's on ball performed SAQ x 10 each LE Hook lying with ball between knees and pelvic tilts x 10 Isometric ER in hook lying with manual resistance of therapist moderate intensity x 10 reps each LE Hip abduction with isometric hold end range x 10 reps each LE Ball held between hands: bilateral flexion overhead with TrA contraction, hook lying x 15 reps  Patient response to treatment: patient demonstrated improved technique with exercises with minimal VC for correct alignment. Patient with improved ability to walk with decreased assistance at end  of session. Increased tone noted in left LE with repetition of knee flexion, extension exercises. No increased pain reported during session        PT Education - 07/25/16 1203    Education provided Yes   Education Details HEP: continue with core strengthening exercise and ROM exercises   Person(s) Educated Patient   Methods Explanation;Demonstration;Tactile cues   Comprehension Verbalized understanding;Returned demonstration;Verbal cues required             PT Long Term Goals - 07/23/16 1000      PT LONG TERM GOAL #1   Title pt's gait speed will be at least 0.8 m/s with LRAD for full community ambulation by 09/04/2016   Baseline to be  assessed; deferred due to increased fatigue   Status Revised     PT LONG TERM GOAL #2   Title pt's ABC scale score will improve by at least 10 points by 09/04/2016 indicating improved confidence with daily tasks at home and in community   Baseline 50%   Status Revised     PT LONG TERM GOAL #3   Title Patient will be independent with home program for strength and endurance in order to transition to self management once discharged from physical therapy 09/04/2016   Baseline requires moderate cuing and assistance to perform exercises and guidance for modifications   Status Revised               Plan - 07/25/16 1300    Clinical Impression Statement Patient demonstrated improved strength and endurance with exercises. modified exercises for patient tolerance. overall patient demonstrates improved strength and is able to walk with improved steadiness as indicated by using less assistance of husband/therapist.    Rehab Potential Fair   PT Frequency 2x / week   PT Duration 6 weeks   PT Treatment/Interventions Patient/family education;Gait training;Neuromuscular re-education;Therapeutic exercise;Manual techniques   PT Next Visit Plan exercises for strength, endurance, walking   PT Home Exercise Plan strengthening exercises for LE's, core control      Patient will benefit from skilled therapeutic intervention in order to improve the following deficits and impairments:  Decreased strength, Decreased balance, Decreased activity tolerance, Decreased endurance, Difficulty walking  Visit Diagnosis: Muscle weakness (generalized)  Difficulty in walking, not elsewhere classified     Problem List Patient Active Problem List   Diagnosis Date Noted  . Lower extremity edema 01/13/2016  . Bilateral ovarian cysts 10/16/2015  . Health care maintenance 10/16/2015  . Sinusitis 03/12/2015  . Colon polyp 01/25/2015  . Bone/cartilage disorder 01/25/2015  . Headache 11/18/2014  . Depression  11/18/2014  . Greater tuberosity of humerus fracture 11/18/2014  . Multiple sclerosis ([place]) 11/18/2014  . Unsteady gait 11/18/2014  . Dizziness 11/18/2014  . Inconclusive mammogram 03/15/2013  . Back ache 09/06/2012  . BP (high blood pressure) 09/06/2012  . Hypercholesterolemia 01/28/2012  . Climacteric 01/28/2012  . Routine general medical examination at a health care facility 01/28/2012  . Avitaminosis D 01/28/2012    Jomarie Longs PT 07/25/2016, 10:36 PM  [facility] [address] [place], [address] Phone: [phone]   Fax:  [phone]  Name: CAEDENCE WALBORN MRN: [id number] Date of Birth: [birth year]

## 2016-07-28 ENCOUNTER — Ambulatory Visit: Payer: Medicare Other

## 2016-07-29 ENCOUNTER — Ambulatory Visit: Payer: Medicare Other | Admitting: Physical Therapy

## 2016-07-29 ENCOUNTER — Encounter: Payer: Self-pay | Admitting: Physical Therapy

## 2016-07-29 DIAGNOSIS — M6281 Muscle weakness (generalized): Secondary | ICD-10-CM

## 2016-07-29 DIAGNOSIS — R262 Difficulty in walking, not elsewhere classified: Secondary | ICD-10-CM

## 2016-07-29 NOTE — Therapy (Signed)
[facility] [address] [place], [address] Phone: [phone]   Fax:  [phone]  Physical Therapy Treatment/Progress Report  Patient Details  Name: Carly [name] MRN: [id number] Date of Birth: September 12, 1951 Referring Provider: Effie Shy MD  Encounter Date: 07/29/2016      PT End of Session - 07/29/16 1029    Visit Number 10   Number of Visits 24   Date for PT Re-Evaluation 09/04/16   Authorization Type 10   Authorization Time Period 10 (G code)   PT Start Time 0935   PT Stop Time 1020   PT Time Calculation (min) 45 min   Activity Tolerance Patient limited by fatigue   Behavior During Therapy Anxious;WFL for tasks assessed/performed      Past Medical History:  Diagnosis Date  . Allergy   . Depression   . Frequent headaches    H/O  . History of chicken pox   . History of colon polyps   . Hx of migraines   . Multiple sclerosis ([place])   . PONV (postoperative nausea and vomiting)     Past Surgical History:  Procedure Laterality Date  . FOOT SURGERY  2015  . GALLBLADDER SURGERY  2008  . HARDWARE REMOVAL Left 02/14/2016   Procedure: LEFT FOOT REMOVAL DEEP IMPLANT;  Surgeon: [name], MD;  Location: [facility];  Service: Orthopedics;  Laterality: Left;  . SPINE SURGERY  2014    There were no vitals filed for this visit.      Subjective Assessment - 07/29/16 0942    Subjective Patient reports she is stiff today.   Limitations Standing;Walking   Patient Stated Goals Patient would like to improve confidence and walk without AD   Currently in Pain? No/denies        Objective: Gait; ambulating with SPC and min A x 1 using husband/therapist for balance on left side, slow cautious speed Tone: increased left LE extensor tone noted with exercises Strength: right LE 5/5 major muscle groups; left LE hip flexion 4/5, knee extension 4/5, knee flexion 4-/5 with increased  tone noted with repetitions,  Outcome measures: ABC scale 50% confidence level with walking, daily activities 10MW deferred due to fatigue/stiffness (plan next visit)  Treatment:  Therapeutic exercise: patient performed exercises with verbal, tactile cues and demonstration of therapist: Sitting;  Hip adduction with ball between knees with glute sets x 10 Resistive red band scapular rows x 15 reps, straight arm pull downs x 15 reps Hip abduction with resistive band (double red) x 20 reps Knee flexion with red resistive band x 25 right LE, 15 left LE Sit to stand off elevated surface (25") with guided movement to decrease retropulsion and using LE's for support 2 x 5 reps Supine lying: Hamstring stretch with assistance of PT; 3 x 20 seconds each LE LTR with assistance for short arc motion x 20 reps each Hook lying with ball between knees and pelvic tilts/bridges x 10 Isometric ER in hook lying with manual resistance of therapist moderate intensity x 10 reps each LE Hip abduction with isometric hold end range x 10 reps each LE Ball held between hands: bilateral flexion overhead with TrA contraction, hook lying x 15 reps  Patient response to treatment: Patient demonstrated improved endurance with exercises, continues with decreased confidence with sit to stand and walking. Patient required minimal VC and assistance to perform exercises with good technique and hip/trunk alignment. Patient with increased extensor tone  with knee flexion exercises        PT Education - 07/29/16 1027    Education provided Yes   Education Details HEP: continue with exercises as insructed   Person(s) Educated Patient   Methods Explanation;Demonstration;Verbal cues   Comprehension Verbalized understanding;Returned demonstration;Verbal cues required             PT Long Term Goals - 08-22-2016 1135      PT LONG TERM GOAL #1   Title pt's gait speed will be at least 0.8 m/s with LRAD for full community  ambulation by 09/04/2016   Baseline to be assessed; deferred due to increased fatigue   Status On-going     PT LONG TERM GOAL #2   Title pt's ABC scale score will improve by at least 10 points by 09/04/2016 indicating improved confidence with daily tasks at home and in community   Baseline 50%   Status On-going     PT LONG TERM GOAL #3   Title Patient will be independent with home program for strength and endurance in order to transition to self management once discharged from physical therapy 09/04/2016   Baseline requires moderate cuing and assistance to perform exercises and guidance for modifications   Status On-going               Plan - 07/29/16 1028    Clinical Impression Statement Patient demonstrates improvement with strength and endurance with exercises. She requires assistance and cuing to perform exercises with correct technique and alignment. She continues with decreased confidence and functional limitations 50% and will benefit from additional physical therapy intervention to achieve goals.   PT Frequency 2x / week   PT Duration 6 weeks   PT Treatment/Interventions Patient/family education;Gait training;Neuromuscular re-education;Therapeutic exercise;Manual techniques   PT Next Visit Plan exercises for strength, endurance, walking   PT Home Exercise Plan strengthening exercises for LE's, core control      Patient will benefit from skilled therapeutic intervention in order to improve the following deficits and impairments:  Decreased strength, Decreased balance, Decreased activity tolerance, Decreased endurance, Difficulty walking  Visit Diagnosis: Muscle weakness (generalized)  Difficulty in walking, not elsewhere classified       G-Codes - 08/22/2016 1134    Functional Assessment Tool Used ABC scalel, strength deficits, clinical judgment   Functional Limitation Mobility: Walking and moving around   Mobility: Walking and Moving Around Current Status [phone])  At least 40 percent but less than 60 percent impaired, limited or restricted   Mobility: Walking and Moving Around Goal Status [phone]) At least 20 percent but less than 40 percent impaired, limited or restricted      Problem List Patient Active Problem List   Diagnosis Date Noted  . Lower extremity edema 01/13/2016  . Bilateral ovarian cysts 10/16/2015  . Health care maintenance 10/16/2015  . Sinusitis 03/12/2015  . Colon polyp 01/25/2015  . Bone/cartilage disorder 01/25/2015  . Headache 11/18/2014  . Depression 11/18/2014  . Greater tuberosity of humerus fracture 11/18/2014  . Multiple sclerosis ([place]) 11/18/2014  . Unsteady gait 11/18/2014  . Dizziness 11/18/2014  . Inconclusive mammogram 03/15/2013  . Back ache 09/06/2012  . BP (high blood pressure) 09/06/2012  . Hypercholesterolemia 01/28/2012  . Climacteric 01/28/2012  . Routine general medical examination at a health care facility 01/28/2012  . Avitaminosis D 01/28/2012    Jomarie Longs PT 08/22/16, 11:36 AM  [facility] [address], [place], [address] Phone:  [phone]   Fax:  [phone]  Name: Carly [name] MRN: [id number] Date of Birth: 10-21-1950

## 2016-08-11 ENCOUNTER — Ambulatory Visit: Payer: Medicare Other | Admitting: Physical Therapy

## 2016-08-12 ENCOUNTER — Encounter: Payer: Medicare Other | Admitting: Physical Therapy

## 2016-08-14 ENCOUNTER — Ambulatory Visit: Payer: Medicare Other | Admitting: Physical Therapy

## 2016-08-18 ENCOUNTER — Ambulatory Visit: Payer: Medicare Other | Admitting: Physical Therapy

## 2016-08-22 ENCOUNTER — Ambulatory Visit: Payer: Medicare Other | Attending: Psychiatry | Admitting: Physical Therapy

## 2016-08-22 ENCOUNTER — Encounter: Payer: Self-pay | Admitting: Physical Therapy

## 2016-08-22 DIAGNOSIS — R262 Difficulty in walking, not elsewhere classified: Secondary | ICD-10-CM

## 2016-08-22 DIAGNOSIS — M6281 Muscle weakness (generalized): Secondary | ICD-10-CM | POA: Diagnosis not present

## 2016-08-23 NOTE — Therapy (Signed)
[facility] [address] [place], [address] Phone: [phone]   Fax:  [phone]  Physical Therapy Treatment  Patient Details  Name: Carly [name] MRN: [id number] Date of Birth: Oct 16, 1950 Referring Provider: Effie Shy MD  Encounter Date: 08/22/2016      PT End of Session - 08/22/16 1220    Visit Number 11   Number of Visits 24   Date for PT Re-Evaluation 09/04/16   Authorization Type 11   Authorization Time Period 20 (G code)   PT Start Time 1212   PT Stop Time 1255   PT Time Calculation (min) 43 min   Activity Tolerance Patient tolerated treatment well   Behavior During Therapy [facility] for tasks assessed/performed      Past Medical History:  Diagnosis Date  . Allergy   . Depression   . Frequent headaches    H/O  . History of chicken pox   . History of colon polyps   . Hx of migraines   . Multiple sclerosis ([place])   . PONV (postoperative nausea and vomiting)     Past Surgical History:  Procedure Laterality Date  . FOOT SURGERY  2015  . GALLBLADDER SURGERY  2008  . HARDWARE REMOVAL Left 02/14/2016   Procedure: LEFT FOOT REMOVAL DEEP IMPLANT;  Surgeon: [name], MD;  Location: [facility];  Service: Orthopedics;  Laterality: Left;  . SPINE SURGERY  2014    There were no vitals filed for this visit.      Subjective Assessment - 08/22/16 1217    Subjective Patient reports she stopped taking Abilify ~10 days ago and has seen a great difference in her anxiety level and abiltiy to function with less difficulty. She reports her swelling in left foot is improving as well. She is less tired now and has more energy since comming off of Abilify. She has not been exercising regularly due to working through changes in medication and prednisone.   Limitations Standing;Walking   Patient Stated Goals Patient would like to improve confidence and walk without AD   Currently in  Pain? No/denies      Objective: Gait; ambulating with SPC and min A x 1 using husband/therapist for balance on left side, slow cadence with decreased hip/knee flexion, shuffling gait pattern Tone: increased tone in both LE's  Outcome measures: 10MW: 19.5, 23.5 seconds (.52m/s) used rolator for support TUG: 31, 34 seconds (<14 seconds WNL for decreased fall risk)   Treatment:  Therapeutic exercise: patient performed exercises with verbal, tactile cues and demonstration of therapist: Sitting;  Rhythmic stabilization flexion, extension x 10 reps Manual resisted extension x 10 reps mild resistance given Supine lying: Hamstring stretch with assistance of PT; 3 x 20 seconds each LE LTR with assistance and use of 45cm ball x 20 reps Hip and knee flexion with ball x 15  Isometric ER in hook lying with manual resistance of therapist moderate intensity x 10 reps each LE Hip abduction with isometric hold end range x 10 reps each LE Ball held between hands: bilateral flexion overhead with TrA contraction, hook lying x 15 reps Walking with Rolator with concentration and VC for controlled hip and knee flexion and improved step length  Patient response to treatment: Patient demonstrated improved technique with exercises with minimal VC for correct alignment.Patient required assistance for balance with walking exercises, improve gait pattern with controlled motion and VC.  PT Education - 08/22/16 1301    Education provided Yes   Education Details HEP: ROM, LTR, core control exercises   Person(s) Educated Patient   Methods Explanation;Demonstration;Tactile cues   Comprehension Verbalized understanding;Returned demonstration;Verbal cues required             PT Long Term Goals - 07/30/16 1135      PT LONG TERM GOAL #1   Title pt's gait speed will be at least 0.8 m/s with LRAD for full community ambulation by 09/04/2016   Baseline to be assessed; deferred due to increased  fatigue   Status On-going     PT LONG TERM GOAL #2   Title pt's ABC scale score will improve by at least 10 points by 09/04/2016 indicating improved confidence with daily tasks at home and in community   Baseline 50%   Status On-going     PT LONG TERM GOAL #3   Title Patient will be independent with home program for strength and endurance in order to transition to self management once discharged from physical therapy 09/04/2016   Baseline requires moderate cuing and assistance to perform exercises and guidance for modifications   Status On-going               Plan - 08/22/16 1222    Clinical Impression Statement Patient demonstrated improved abiltiy to perform exercises and less fatigue. She demonstrated improved motor control with improved gait pattern; increased hip/knee flexion and step length. Requires assistance to perform all exercises and will benefit from additional physical therapy intervention to achieve maximal gains/function.    Rehab Potential Fair   PT Frequency 2x / week   PT Duration 6 weeks   PT Treatment/Interventions Patient/family education;Gait training;Neuromuscular re-education;Therapeutic exercise;Manual techniques   PT Next Visit Plan exercises for strength, endurance, walking   PT Home Exercise Plan strengthening exercises for LE's, core control      Patient will benefit from skilled therapeutic intervention in order to improve the following deficits and impairments:  Decreased strength, Decreased balance, Decreased activity tolerance, Decreased endurance, Difficulty walking  Visit Diagnosis: Muscle weakness (generalized)  Difficulty in walking, not elsewhere classified     Problem List Patient Active Problem List   Diagnosis Date Noted  . Lower extremity edema 01/13/2016  . Bilateral ovarian cysts 10/16/2015  . Health care maintenance 10/16/2015  . Sinusitis 03/12/2015  . Colon polyp 01/25/2015  . Bone/cartilage disorder 01/25/2015  .  Headache 11/18/2014  . Depression 11/18/2014  . Greater tuberosity of humerus fracture 11/18/2014  . Multiple sclerosis ([place]) 11/18/2014  . Unsteady gait 11/18/2014  . Dizziness 11/18/2014  . Inconclusive mammogram 03/15/2013  . Back ache 09/06/2012  . BP (high blood pressure) 09/06/2012  . Hypercholesterolemia 01/28/2012  . Climacteric 01/28/2012  . Routine general medical examination at a health care facility 01/28/2012  . Avitaminosis D 01/28/2012    Jomarie Longs PT 08/23/2016, 2:04 PM  [facility] [address], [place], [address] Phone: [phone]   Fax:  [phone]  Name: ANALAYA MILKO MRN: [id number] Date of Birth: 04/20/1951

## 2016-08-25 ENCOUNTER — Encounter: Payer: Self-pay | Admitting: Physical Therapy

## 2016-08-25 ENCOUNTER — Ambulatory Visit: Payer: Medicare Other | Admitting: Physical Therapy

## 2016-08-25 DIAGNOSIS — M6281 Muscle weakness (generalized): Secondary | ICD-10-CM

## 2016-08-25 DIAGNOSIS — R262 Difficulty in walking, not elsewhere classified: Secondary | ICD-10-CM

## 2016-08-26 NOTE — Therapy (Signed)
[facility] [address], [place], [address] Phone: [phone]   Fax:  [phone]  Physical Therapy Treatment  Patient Details  Name: Carly [name] MRN: [id number] Date of Birth: [birth year] Referring Provider: Effie Shy MD  Encounter Date: 08/25/2016      PT End of Session - 08/25/16 1238    Visit Number 12   Number of Visits 24   Date for PT Re-Evaluation 09/04/16   Authorization Type 12   Authorization Time Period 20 (G code)   PT Start Time [id number]   PT Stop Time 1214   PT Time Calculation (min) 40 min   Activity Tolerance Patient limited by pain   Behavior During Therapy [facility] for tasks assessed/performed;Anxious      Past Medical History:  Diagnosis Date  . Allergy   . Depression   . Frequent headaches    H/O  . History of chicken pox   . History of colon polyps   . Hx of migraines   . Multiple sclerosis ([place])   . PONV (postoperative nausea and vomiting)     Past Surgical History:  Procedure Laterality Date  . FOOT SURGERY  2015  . GALLBLADDER SURGERY  2008  . HARDWARE REMOVAL Left 02/14/2016   Procedure: LEFT FOOT REMOVAL DEEP IMPLANT;  Surgeon: [name], MD;  Location: [facility];  Service: Orthopedics;  Laterality: Left;  . SPINE SURGERY  2014    There were no vitals filed for this visit.      Subjective Assessment - 08/25/16 1137    Subjective Patient reports she is still having withdrawals from weaning off Abilify and is taking sudafed today, and has a bad headache and is shaking.    Limitations Standing;Walking   Patient Stated Goals Patient would like to improve confidence and walk without AD   Currently in Pain? Yes   Pain Score 5    Pain Location Head   Pain Orientation Other (Comment)  neck into frontal region   Pain Descriptors / Indicators Aching;Spasm   Pain Type Acute pain   Pain Onset In the past 7 days   Pain Frequency Constant      Objective: Observation: patient arrived in clinic with husband's assistance, using SPC and holding onto his arm for balance. Observed hands shaking when held in front of her, eyes partially closed due to headache pain Palpation; moderate pain, tenderness posterior cervical spine, paraspinal muscles and upper trapezius muscles.   Treatment:  Manual therapy: cervical spine, upper trapezius muscles STM with patient seated in chair with back supported, STM superficial techniques: pain control, reduction of spasms Modalities: Moist heat packs applied to both shoulders, upper trapezius muscles with patient seated x 10 min. Prior to exercises; goal: pain, reduce spasms; no adverse reactions noted following moist heat  Therapeutic exercise: patient performed exercises with verbal, tactile cues and demonstration of therapist: Sitting: Hip adduction with glute sets x 10 Hip abduction without resistance x 15 Knee extension active only x 15 reps each LE Hip flexion x 15 reps each LE Ankle DF/PF x 20 reps each LE  Patient response to treatment: patient demonstrated improved technique with exercises with minimal VC for sequence. Patient with decreased pain from  5/10 to  2/10. Patient with decreased spasms by 50% following STM. Improved ability to walk with less assistance at end of session.        PT Education - 08/25/16 1236    Education provided  Yes   Education Details HEP: modified exercises to work in chair with AROM to avoid increased pain, to tolerate some exercise even with headache, spasms    Person(s) Educated Patient   Methods Explanation;Demonstration;Verbal cues   Comprehension Returned demonstration;Verbal cues required;Verbalized understanding             PT Long Term Goals - 07/30/16 1135      PT LONG TERM GOAL #1   Title pt's gait speed will be at least 0.8 m/s with LRAD for full community ambulation by 09/04/2016   Baseline to be assessed; deferred due to increased  fatigue   Status On-going     PT LONG TERM GOAL #2   Title pt's ABC scale score will improve by at least 10 points by 09/04/2016 indicating improved confidence with daily tasks at home and in community   Baseline 50%   Status On-going     PT LONG TERM GOAL #3   Title Patient will be independent with home program for strength and endurance in order to transition to self management once discharged from physical therapy 09/04/2016   Baseline requires moderate cuing and assistance to perform exercises and guidance for modifications   Status On-going               Plan - 08/25/16 1230    Clinical Impression Statement Patient limited by headache, spasms and shaking today, not feeling well in general. She did respond well to STM, heat for cervical spine/headache and was able to perform modified exercises seated in chair. She continues with limited flexibility and strength and will continue to benefit from additional physical therapy in order to achieve maximal function and transition to independent home program.    Rehab Potential Fair   PT Frequency 2x / week   PT Duration 6 weeks   PT Treatment/Interventions Patient/family education;Gait training;Neuromuscular re-education;Therapeutic exercise;Manual techniques   PT Next Visit Plan exercises for strength, endurance, walking   PT Home Exercise Plan strengthening exercises for LE's, core control      Patient will benefit from skilled therapeutic intervention in order to improve the following deficits and impairments:  Decreased strength, Decreased balance, Decreased activity tolerance, Decreased endurance, Difficulty walking  Visit Diagnosis: Muscle weakness (generalized)  Difficulty in walking, not elsewhere classified     Problem List Patient Active Problem List   Diagnosis Date Noted  . Lower extremity edema 01/13/2016  . Bilateral ovarian cysts 10/16/2015  . Health care maintenance 10/16/2015  . Sinusitis 03/12/2015  .  Colon polyp 01/25/2015  . Bone/cartilage disorder 01/25/2015  . Headache 11/18/2014  . Depression 11/18/2014  . Greater tuberosity of humerus fracture 11/18/2014  . Multiple sclerosis ([place]) 11/18/2014  . Unsteady gait 11/18/2014  . Dizziness 11/18/2014  . Inconclusive mammogram 03/15/2013  . Back ache 09/06/2012  . BP (high blood pressure) 09/06/2012  . Hypercholesterolemia 01/28/2012  . Climacteric 01/28/2012  . Routine general medical examination at a health care facility 01/28/2012  . Avitaminosis D 01/28/2012    Jomarie Longs PT 08/26/2016, 2:41 PM  [facility] [address] [place], [address] Phone: [phone]   Fax:  [phone]  Name: Carly [name] MRN: [id number] Date of Birth: 14-Jun-1951

## 2016-08-29 ENCOUNTER — Ambulatory Visit: Payer: Medicare Other | Admitting: Physical Therapy

## 2016-08-29 ENCOUNTER — Encounter: Payer: Self-pay | Admitting: Physical Therapy

## 2016-08-29 DIAGNOSIS — R262 Difficulty in walking, not elsewhere classified: Secondary | ICD-10-CM

## 2016-08-29 DIAGNOSIS — M6281 Muscle weakness (generalized): Secondary | ICD-10-CM

## 2016-08-30 NOTE — Therapy (Signed)
[facility] [address] [place], [address] Phone: [phone]   Fax:  [phone]  Physical Therapy Treatment  Patient Details  Name: Carly [name] MRN: [id number] Date of Birth: 07-Aug-1951 Referring Provider: Effie Shy MD  Encounter Date: 08/29/2016      PT End of Session - 08/29/16 1142    Visit Number 13   Number of Visits 24   Date for PT Re-Evaluation 09/04/16   Authorization Type 13   Authorization Time Period 20 (G code)   PT Start Time 1133   PT Stop Time 1205   PT Time Calculation (min) 32 min   Activity Tolerance Patient limited by pain   Behavior During Therapy [facility] for tasks assessed/performed;Anxious      Past Medical History:  Diagnosis Date  . Allergy   . Depression   . Frequent headaches    H/O  . History of chicken pox   . History of colon polyps   . Hx of migraines   . Multiple sclerosis ([place])   . PONV (postoperative nausea and vomiting)     Past Surgical History:  Procedure Laterality Date  . FOOT SURGERY  2015  . GALLBLADDER SURGERY  2008  . HARDWARE REMOVAL Left 02/14/2016   Procedure: LEFT FOOT REMOVAL DEEP IMPLANT;  Surgeon: [name], MD;  Location: [facility];  Service: Orthopedics;  Laterality: Left;  . SPINE SURGERY  2014    There were no vitals filed for this visit.      Subjective Assessment - 08/29/16 1136    Subjective Paitent reports she is having pain in left leg today. She is having intermittent headaches as well, since weaning off Abilify.    Limitations Standing;Walking   Patient Stated Goals Patient would like to improve confidence and walk without AD   Currently in Pain? Yes   Pain Location Leg   Pain Orientation Left   Pain Descriptors / Indicators Tightness   Pain Type Acute pain   Pain Onset In the past 7 days   Pain Frequency Constant     Objective; Gait: ambulating into clinic using SPC and using minimal  support of one; decreased trunk rotation, decreased hip/knee flexion Tone: increased extensor tone both LE's, left>right  Treatment: Therapeutic exercise: patient performed exercises with verbal, tactile cues and demonstration of therapist: Sitting;  Rhythmic stabilization flexion x 10 reps Manual resisted extension x 10 reps mild resistance given Supine lying: Hamstring stretch with assistance of PT; 3 x 20 seconds each LE LTR with assistance and use of 45cm ball x 20 reps Hip and knee flexion with ball each LE  x 15  Isometric ER in hook lying with manual resistance of therapist moderate intensity x 10 reps each LE Hip abduction with isometric hold end range x 10 reps each LE Ball (2#) held between hands: bilateral flexion overhead with TrA contraction, hook lying x 15 reps   Patient response to treatment: Patient demonstrated improved technique with exercises with minimal VC and assistance for correct alignment and performance. Patient with improved flexibility in LE's with stretching and exercises. Improved ability to walk with less difficulty and improved confidence at end of session.        PT Education - 08/29/16 1200    Education provided Yes   Education Details HEP: ROM, exercises with less intensity to avoid exacerbation of symptoms   Person(s) Educated Patient   Methods Explanation;Demonstration;Verbal cues   Comprehension Verbalized understanding;Returned  demonstration;Verbal cues required             PT Long Term Goals - 07/30/16 1135      PT LONG TERM GOAL #1   Title pt's gait speed will be at least 0.8 m/s with LRAD for full community ambulation by 09/04/2016   Baseline to be assessed; deferred due to increased fatigue   Status On-going     PT LONG TERM GOAL #2   Title pt's ABC scale score will improve by at least 10 points by 09/04/2016 indicating improved confidence with daily tasks at home and in community   Baseline 50%   Status On-going     PT  LONG TERM GOAL #3   Title Patient will be independent with home program for strength and endurance in order to transition to self management once discharged from physical therapy 09/04/2016   Baseline requires moderate cuing and assistance to perform exercises and guidance for modifications   Status On-going               Plan - 08/29/16 1208    Clinical Impression Statement Patient demonstrated improved ability to perform exercises with repetition and guidance with VC and assisance for correct technique and intensity. She continues with decreased confidence with walking and decreased endurance with increased tone in LE's and will benefit from additional physical therapy intervention to achieve goals for maximal function and independent home program.    Rehab Potential Fair   PT Frequency 2x / week   PT Duration 6 weeks   PT Treatment/Interventions Patient/family education;Gait training;Neuromuscular re-education;Therapeutic exercise;Manual techniques   PT Next Visit Plan exercises for strength, endurance, walking   PT Home Exercise Plan strengthening exercises for LE's, core control      Patient will benefit from skilled therapeutic intervention in order to improve the following deficits and impairments:  Decreased strength, Decreased balance, Decreased activity tolerance, Decreased endurance, Difficulty walking  Visit Diagnosis: Muscle weakness (generalized)  Difficulty in walking, not elsewhere classified     Problem List Patient Active Problem List   Diagnosis Date Noted  . Lower extremity edema 01/13/2016  . Bilateral ovarian cysts 10/16/2015  . Health care maintenance 10/16/2015  . Sinusitis 03/12/2015  . Colon polyp 01/25/2015  . Bone/cartilage disorder 01/25/2015  . Headache 11/18/2014  . Depression 11/18/2014  . Greater tuberosity of humerus fracture 11/18/2014  . Multiple sclerosis ([place]) 11/18/2014  . Unsteady gait 11/18/2014  . Dizziness 11/18/2014  .  Inconclusive mammogram 03/15/2013  . Back ache 09/06/2012  . BP (high blood pressure) 09/06/2012  . Hypercholesterolemia 01/28/2012  . Climacteric 01/28/2012  . Routine general medical examination at a health care facility 01/28/2012  . Avitaminosis D 01/28/2012    Jomarie Longs PT 08/30/2016, 5:28 PM  [facility] [address] [place], [address] Phone: [phone]   Fax:  [phone]  Name: ATHENEA SNITKER MRN: [id number] Date of Birth: [birth year]

## 2016-09-02 ENCOUNTER — Ambulatory Visit
Admission: EM | Admit: 2016-09-02 | Discharge: 2016-09-02 | Disposition: A | Discharge status: Home / Self Care | Payer: Medicare Other | Attending: Family Medicine | Admitting: Family Medicine

## 2016-09-02 ENCOUNTER — Encounter: Payer: Self-pay | Admitting: *Deleted

## 2016-09-02 ENCOUNTER — Telehealth: Payer: Self-pay | Admitting: Internal Medicine

## 2016-09-02 DIAGNOSIS — M62838 Other muscle spasm: Secondary | ICD-10-CM

## 2016-09-02 MED ORDER — TIZANIDINE HCL 4 MG PO CAPS: 4.0000 mg | ORAL_CAPSULE | Freq: Three times a day (TID) | ORAL | 0 refills | Status: DC

## 2016-09-02 NOTE — ED Triage Notes (Signed)
Pt here with c/o headache today. Pt has hx of MS, was on Abilify for 14 months. Recently told to stop taking that med. Has had intermittent headache for 10 days. Pain is right parietal, occipital, and neck pain.

## 2016-09-02 NOTE — ED Provider Notes (Signed)
CSN: [id number]     Arrival date & time 09/02/16  1119 History   First MD Initiated Contact with Patient 09/02/16 1307     Chief Complaint  Patient presents with  . Headache   (Consider location/radiation/quality/duration/timing/severity/associated sxs/prior Treatment) HPI  This a 65 year old female accompanied by her husband presents with a right-sided headache that she has had waxing and waning for the last 10 days. States that she was awakened from sleep last night about 3 AM with a headache being hydrogenated and out of 10 in severity. Arriving in our office however it has a decreased to a 3/10. She has multiple comorbidities including MS. She was recently cold - [place] off of medication , Abilify and since that time has noticed increased headaches has had some positive changes with the decrease of her pedal edema. This was stopped about 3 weeks prior. He states that she's been under a great deal of family stress recently which may account for some of her headache. Prominent with her headache is muscle spasm and pain in the back of her neck and radiate towards her occipital area and frontal. She was concerned she was going to have a stroke but has had no other neurological symptoms. She's had no visual disturbances paresthesias numbness other than her normal MS.       Past Medical History:  Diagnosis Date  . Allergy   . Depression   . Frequent headaches    H/O  . History of chicken pox   . History of colon polyps   . Hx of migraines   . Multiple sclerosis ([place])   . PONV (postoperative nausea and vomiting)    Past Surgical History:  Procedure Laterality Date  . FOOT SURGERY  2015  . GALLBLADDER SURGERY  2008  . HARDWARE REMOVAL Left 02/14/2016   Procedure: LEFT FOOT REMOVAL DEEP IMPLANT;  Surgeon: [name], MD;  Location: [facility];  Service: Orthopedics;  Laterality: Left;  . SPINE SURGERY  2014   Family History  Problem Relation Age of Onset  . Arthritis  Mother   . Hypertension Mother   . Hypertension Father   . Hyperlipidemia Father   . Heart disease Maternal Grandfather   . Diabetes Maternal Grandfather   . Kidney disease Paternal Grandmother    Social History  Substance Use Topics  . Smoking status: Never Smoker  . Smokeless tobacco: Never Used  . Alcohol use No   OB History    No data available     Review of Systems  Constitutional: Positive for activity change. Negative for appetite change, chills, fatigue and fever.  Neurological: Positive for headaches. Negative for tremors, seizures, syncope, speech difficulty, weakness and numbness.  Psychiatric/Behavioral: Negative for dysphoric mood. The patient is not nervous/anxious.   All other systems reviewed and are negative.   Allergies  Asa [aspirin]; Buspirone; Levofloxacin; Lexapro [escitalopram oxalate]; Motrin [ibuprofen]; Ceftin [cefuroxime axetil]; Penicillins; and Sulfa antibiotics  Home Medications   Prior to Admission medications   Medication Sig Start Date End Date Taking? Authorizing Provider  acetaminophen (TYLENOL) 325 MG tablet Take 650 mg by mouth every 4 (four) hours as needed.   Yes Historical Provider, MD  ALPRAZolam Duanne Moron) 0.5 MG tablet Take by mouth. Reported on 04/09/2016   Yes Historical Provider, MD  B Complex Vitamins (VITAMIN B COMPLEX PO) Take by mouth.   Yes Historical Provider, MD  CALCIUM PO Take 400 mg by mouth daily.   Yes Historical Provider, MD  Calcium-Phosphorus-Vitamin D (  CITRACAL +D3 PO) Take by mouth. 2 tablets twice a day   Yes Historical Provider, MD  Cholecalciferol (D3-1000 PO) Take by mouth.   Yes Historical Provider, MD  Coenzyme Q10 (CO Q10) 100 MG CAPS Take by mouth.   Yes Historical Provider, MD  dalfampridine 10 MG TB12 Take 10 mg by mouth every 12 (twelve) hours.   Yes Historical Provider, MD  DULoxetine (CYMBALTA) 30 MG capsule Take 60 mg by mouth daily. 02/27/15  Yes Historical Provider, MD  Multiple Vitamins-Minerals  (CENTRUM SILVER ULTRA WOMENS PO) Take by mouth.   Yes Historical Provider, MD  pseudoephedrine (SUDAFED) 30 MG tablet Take 30 mg by mouth every 6 (six) hours as needed for congestion.   Yes Historical Provider, MD  sertraline (ZOLOFT) 100 MG tablet Take 200 mg by mouth daily.   Yes Historical Provider, MD  sodium chloride (V-R NASAL SPRAY SALINE) 0.65 % nasal spray Place into the nose.   Yes Historical Provider, MD  vitamin B-12 (CYANOCOBALAMIN) 1000 MCG tablet Take 1,000 mcg by mouth daily.   Yes Historical Provider, MD  nystatin cream (MYCOSTATIN) Apply 1 application topically 2 (two) times daily. 07/09/16   Einar Pheasant, MD  ocrelizumab (OCREVUS) 300 MG/10ML injection Inject into the vein every 6 (six) months.    Historical Provider, MD  tiZANidine (ZANAFLEX) 4 MG capsule Take 1 capsule (4 mg total) by mouth 3 (three) times daily. 09/02/16   Lorin Picket, PA-C   Meds Ordered and Administered this Visit  Medications - No data to display  BP (!) 146/86 (BP Location: Left Arm)   Pulse 94   Temp 98.6 F (37 C) (Oral)   Resp 16   Ht 5\' 3"  (1.6 m)   Wt 168 lb (76.2 kg)   SpO2 98%   BMI 29.76 kg/m  No data found.   Physical Exam  Constitutional: She is oriented to person, place, and time. She appears well-developed and well-nourished. No distress.  HENT:  Head: Normocephalic and atraumatic.  Eyes: EOM are normal. Pupils are equal, round, and reactive to light.  Neck: Neck supple.  Examination of the cervical spine shows good range of motion with discomfort at the extremes of right rotation and right lateral flexion and extension. There is no occipital tenderness today. Has no rash on the scalp. Cranial nerves are intact to clinical testing. Upper extremity  strength is intact as is upper extremity sensation.  Musculoskeletal: She exhibits no edema, tenderness or deformity.  Lymphadenopathy:    She has no cervical adenopathy.  Neurological: She is alert and oriented to person,  place, and time.  Skin: Skin is warm and dry. She is not diaphoretic.  Psychiatric: She has a normal mood and affect. Her behavior is normal. Judgment and thought content normal.  Nursing note and vitals reviewed.   Urgent Care Course   Clinical Course     Procedures (including critical care time)  Labs Review Labs Reviewed - No data to display  Imaging Review No results found.   Visual Acuity Review  Right Eye Distance:   Left Eye Distance:   Bilateral Distance:    Right Eye Near:   Left Eye Near:    Bilateral Near:         MDM   1. Cervical paraspinous muscle spasm    New Prescriptions   TIZANIDINE (ZANAFLEX) 4 MG CAPSULE    Take 1 capsule (4 mg total) by mouth 3 (three) times daily.  Is decided to take Advil at  home the counter and I recommended 600 mg 3 times a day with food. I also recommended that she use Prilosec or Prevacid as a gastric protective measure. I warned her on the use of these Zanaflex regarding activities requiring concentration and judgment and not to drive while taking the medication. They are leaving to [facility] for the Thanksgiving holidays have recommended that she contact her primary care physician for further evaluation upon her return. The headaches increase or become unbearable she should be seen in the emergency room.    Lorin Picket, PA-C 09/02/16 1323

## 2016-09-02 NOTE — Telephone Encounter (Signed)
Informed patient she would need to be seen, we did not have any open appointments today. Per Dr.Scott advised patient to try [facility] or walk in clinic

## 2016-09-02 NOTE — Telephone Encounter (Signed)
Pt called and stated that she has been having headaches for about a week now and thinks that it may be a migraine and it goes down into her neck. Pt is leaving to go to [facility] tomorrow and wants to know if you could prescribe her anything?  Pharmacy - [id number] - [place], [place]  Call pt @ [phone]

## 2016-09-08 ENCOUNTER — Other Ambulatory Visit: Payer: Medicare Other

## 2016-09-10 ENCOUNTER — Ambulatory Visit: Payer: Medicare Other | Admitting: Physical Therapy

## 2016-09-10 ENCOUNTER — Encounter: Payer: Self-pay | Admitting: Physical Therapy

## 2016-09-10 DIAGNOSIS — R262 Difficulty in walking, not elsewhere classified: Secondary | ICD-10-CM

## 2016-09-10 DIAGNOSIS — M6281 Muscle weakness (generalized): Secondary | ICD-10-CM | POA: Diagnosis not present

## 2016-09-11 NOTE — Therapy (Signed)
[facility] [address] [place], [address] Phone: [phone]   Fax:  [phone]  Physical Therapy Treatment  Patient Details  Name: Carly [name] MRN: [id number] Date of Birth: 1951-06-07 Referring Provider: Effie Shy MD  Encounter Date: 09/10/2016      PT End of Session - 09/10/16 1456    Visit Number 14   Number of Visits 24   Date for PT Re-Evaluation 10/08/16   Authorization Type 14   Authorization Time Period 20 (G code)   PT Start Time 1450   PT Stop Time 1537   PT Time Calculation (min) 47 min   Activity Tolerance Patient tolerated treatment well   Behavior During Therapy [facility] for tasks assessed/performed;Anxious      Past Medical History:  Diagnosis Date  . Allergy   . Depression   . Frequent headaches    H/O  . History of chicken pox   . History of colon polyps   . Hx of migraines   . Multiple sclerosis ([place])   . PONV (postoperative nausea and vomiting)     Past Surgical History:  Procedure Laterality Date  . FOOT SURGERY  2015  . GALLBLADDER SURGERY  2008  . HARDWARE REMOVAL Left 02/14/2016   Procedure: LEFT FOOT REMOVAL DEEP IMPLANT;  Surgeon: [name], MD;  Location: [facility];  Service: Orthopedics;  Laterality: Left;  . SPINE SURGERY  2014    There were no vitals filed for this visit.      Subjective Assessment - 09/10/16 1454    Subjective Patient reports she is walking better and able to walk without cane in the home at times now. She is seeing improvement overall with mobility since coming off Abilify. She still lacks confidence with walking without walker/cane and feels additional physical therapy would help with strength, endurance and confidence to walk in house and community with less difficulty and allow return to yoga.    Limitations Standing;Walking   Patient Stated Goals Patient would like to improve confidence and walk without AD    Currently in Pain? No/denies     Objective: Gait: ambulating with SPC and assist of husband/therapist arm for balance/support; able to walk with Rolator independently with VC Tone: increased left LE>right with decreased hip rotations and abduction Outcome measures: Using Rolator for both:  10MW 19 seconds (.91m/s) TUG 30 seconds (in need of intervention)   Treatment:  Therapeutic exercise: patient performed exercises with verbal, tactile cues and demonstration of therapist: Supine lying: Hamstring stretch with assistance of PT; 3 x 20 seconds each LE LTR with assistance for short arc motion x 20 reps each Hook lying  pelvic tilts/bridges x 10 Isometric ER in hook lying with manual resistance of therapist moderate intensity x 10 reps each LE Hip abduction with isometric hold end range x 10 reps each LE Ball (2#) held between hands: bilateral flexion overhead with TrA contraction, hook lying x 15 reps  Patient response to treatment: patient demonstrated improved technique with exercises with assistance and VC for correct alignment/performance of most exercises/stretches. Patient continues with decreased confidence with sit to stand and walking.        PT Education - 09/10/16 1456    Education provided Yes   Education Details HEP: Stretches, lower trunk rotation, walking with improved pattern with increased step length and controlled motion   Person(s) Educated Patient   Methods Explanation;Demonstration;Verbal cues   Comprehension Verbalized understanding;Returned demonstration;Verbal cues  required             PT Long Term Goals - 09/10/16 1600      PT LONG TERM GOAL #1   Title pt's gait speed will be at least 0.8 m/s with LRAD for full community ambulation by 10/08/2016   Baseline 09/10/2016 = 19 seconds (.69m/s)   Status Revised     PT LONG TERM GOAL #2   Title pt's ABC scale score will improve by at least 10 points by 10/08/2016 indicating improved confidence with  daily tasks at home and in community   Baseline 50%   Status Revised     PT LONG TERM GOAL #3   Title Patient will be independent with home program for strength and endurance in order to transition to self management once discharged from physical therapy 10/08/2016   Baseline requires moderate cuing and assistance to perform exercises and guidance for modifications   Status Revised     PT LONG TERM GOAL #4   Title Patient will score 15 seconds or less on TUG demonstrating decreased fall risk, improved ambulation by 10/08/2016   Baseline TUG 30 seconds   Status New               Plan - 09/10/16 1458    Clinical Impression Statement Patient demonstrates improvement with decreased stiffness and improving strength. She continues with decreased confidence with walking and decreased motor control with exercise and ambulation. She should improve strength, flexibility and gait pattern with additional physical therpay intervention. Her current impairment is 40% based on 10MW 19 seconds, TUG 30 seconds (both decreased for functional ambulation and decreased fall risk: in need of intervention).    Rehab Potential Fair   Clinical Impairments Affecting Rehab Potential (+) family support, motivated (-) MS, decreased confidence, co morbidities   PT Frequency 2x / week   PT Duration 6 weeks   PT Treatment/Interventions Patient/family education;Gait training;Neuromuscular re-education;Therapeutic exercise;Manual techniques   PT Next Visit Plan exercises for strength, endurance, walking   PT Home Exercise Plan strengthening exercises for LE's, core control; walking daily with increased step length and controlled motion   Consulted and Agree with Plan of Care Patient      Patient will benefit from skilled therapeutic intervention in order to improve the following deficits and impairments:  Decreased strength, Decreased balance, Decreased activity tolerance, Decreased endurance, Difficulty  walking  Visit Diagnosis: Muscle weakness (generalized) - Plan: PT plan of care cert/re-cert  Difficulty in walking, not elsewhere classified - Plan: PT plan of care cert/re-cert     Problem List Patient Active Problem List   Diagnosis Date Noted  . Lower extremity edema 01/13/2016  . Bilateral ovarian cysts 10/16/2015  . Health care maintenance 10/16/2015  . Sinusitis 03/12/2015  . Colon polyp 01/25/2015  . Bone/cartilage disorder 01/25/2015  . Headache 11/18/2014  . Depression 11/18/2014  . Greater tuberosity of humerus fracture 11/18/2014  . Multiple sclerosis ([place]) 11/18/2014  . Unsteady gait 11/18/2014  . Dizziness 11/18/2014  . Inconclusive mammogram 03/15/2013  . Back ache 09/06/2012  . BP (high blood pressure) 09/06/2012  . Hypercholesterolemia 01/28/2012  . Climacteric 01/28/2012  . Routine general medical examination at a health care facility 01/28/2012  . Avitaminosis D 01/28/2012    Jomarie Longs PT 09/11/2016, 9:13 PM  [facility] [address], [place], [address] Phone: [phone]   Fax:  [phone]  Name: BREEANN REYES MRN: [id number] Date of  Birth: 1951/01/04

## 2016-09-15 ENCOUNTER — Encounter: Payer: Medicare Other | Admitting: Physical Therapy

## 2016-09-16 ENCOUNTER — Encounter: Payer: Medicare Other | Admitting: Physical Therapy

## 2016-09-17 ENCOUNTER — Ambulatory Visit: Payer: Medicare Other | Admitting: Physical Therapy

## 2016-09-18 ENCOUNTER — Ambulatory Visit: Payer: Medicare Other | Admitting: Physical Therapy

## 2016-09-22 ENCOUNTER — Ambulatory Visit: Payer: Medicare Other | Attending: Psychiatry | Admitting: Physical Therapy

## 2016-09-22 ENCOUNTER — Encounter: Payer: Self-pay | Admitting: Physical Therapy

## 2016-09-22 DIAGNOSIS — M6281 Muscle weakness (generalized): Secondary | ICD-10-CM | POA: Diagnosis not present

## 2016-09-22 DIAGNOSIS — R262 Difficulty in walking, not elsewhere classified: Secondary | ICD-10-CM | POA: Diagnosis present

## 2016-09-23 NOTE — Therapy (Signed)
[facility] [address], [place], [address] Phone: [phone]   Fax:  [phone]  Physical Therapy Treatment  Patient Details  Name: Carly [name] MRN: [id number] Date of Birth: 10-06-1951 Referring Provider: Effie Shy MD  Encounter Date: 09/22/2016      PT End of Session - 09/22/16 1615    Visit Number 15   Number of Visits 24   Date for PT Re-Evaluation 10/08/16   Authorization Type 15   Authorization Time Period 20 (G code)   PT Start Time [id number]   PT Stop Time 1520   PT Time Calculation (min) 35 min   Activity Tolerance Patient tolerated treatment well;Treatment limited secondary to agitation   Behavior During Therapy [facility] for tasks assessed/performed;Anxious      Past Medical History:  Diagnosis Date  . Allergy   . Depression   . Frequent headaches    H/O  . History of chicken pox   . History of colon polyps   . Hx of migraines   . Multiple sclerosis ([place])   . PONV (postoperative nausea and vomiting)     Past Surgical History:  Procedure Laterality Date  . FOOT SURGERY  2015  . GALLBLADDER SURGERY  2008  . HARDWARE REMOVAL Left 02/14/2016   Procedure: LEFT FOOT REMOVAL DEEP IMPLANT;  Surgeon: [name], MD;  Location: [facility];  Service: Orthopedics;  Laterality: Left;  . SPINE SURGERY  2014    There were no vitals filed for this visit.      Subjective Assessment - 09/22/16 1457    Subjective Patient reports she is walking better and able to walk without cane in the home at times now. she is stressed today and is not feeling well (nauseous).    Limitations Standing;Walking   Patient Stated Goals Patient would like to improve confidence and walk without AD   Currently in Pain? No/denies      Objective: Gait: ambulating with SPC and moderate assist of husband/therapist arm for balance/support Tone:increased extensor tone left LE>right    Treatment:  Therapeutic exercise: patient performed exercises with verbal, tactile cues and demonstration of therapist: Supine lying: Hamstring stretch with assistance of PT; 3 x 20 seconds each LE; AAROM both hips flexion, rotations and abduction x 10 reps each with stretch end ranges LTR with assistance for short arc motion x 20 reps each LTR with ball (45cm) x 15 reps  Hook lying  pelvic tilts/bridgesx 15 Isometric ER in hook lying with manual resistance of therapist moderate intensity x 10 reps each LE Supine hip abduction x 10 left LE Sitting rocker board for DF/PF (both feet) x 2 min. Each    Patient response to treatment: Patient anxious throughout session which limited exercises performed. Patient demonstrated improved ROM with improved flexibility in both hips following session.           PT Education - 09/22/16 1600    Education provided Yes   Education Details HEP: relaxation, rotation, gentle stretching, continue with pilates exercises as able   Person(s) Educated Patient   Methods Explanation   Comprehension Verbalized understanding             PT Long Term Goals - 09/10/16 1600      PT LONG TERM GOAL #1   Title pt's gait speed will be at least 0.8 m/s with LRAD for full community ambulation by 10/08/2016   Baseline 09/10/2016 = 19 seconds (.66m/s)  Status Revised     PT LONG TERM GOAL #2   Title pt's ABC scale score will improve by at least 10 points by 10/08/2016 indicating improved confidence with daily tasks at home and in community   Baseline 50%   Status Revised     PT LONG TERM GOAL #3   Title Patient will be independent with home program for strength and endurance in order to transition to self management once discharged from physical therapy 10/08/2016   Baseline requires moderate cuing and assistance to perform exercises and guidance for modifications   Status Revised     PT LONG TERM GOAL #4   Title Patient will score 15 seconds or less on  TUG demonstrating decreased fall risk, improved ambulation by 10/08/2016   Baseline TUG 30 seconds   Status New               Plan - 09/22/16 1400    Clinical Impression Statement Patient limited by anxiousness and reported increased stress today. She is exercising more at home indicating improved endurance and strength. Her increased tone with increased stress limit exercises and ability to walk.    Rehab Potential Fair   PT Frequency 2x / week   PT Duration 6 weeks   PT Treatment/Interventions Patient/family education;Gait training;Neuromuscular re-education;Therapeutic exercise;Manual techniques   PT Next Visit Plan exercises for strength, endurance, walking   PT Home Exercise Plan strengthening exercises for LE's, core control; walking daily with increased step length and controlled motion      Patient will benefit from skilled therapeutic intervention in order to improve the following deficits and impairments:  Decreased strength, Decreased balance, Decreased activity tolerance, Decreased endurance, Difficulty walking  Visit Diagnosis: Muscle weakness (generalized)  Difficulty in walking, not elsewhere classified     Problem List Patient Active Problem List   Diagnosis Date Noted  . Lower extremity edema 01/13/2016  . Bilateral ovarian cysts 10/16/2015  . Health care maintenance 10/16/2015  . Sinusitis 03/12/2015  . Colon polyp 01/25/2015  . Bone/cartilage disorder 01/25/2015  . Headache 11/18/2014  . Depression 11/18/2014  . Greater tuberosity of humerus fracture 11/18/2014  . Multiple sclerosis ([place]) 11/18/2014  . Unsteady gait 11/18/2014  . Dizziness 11/18/2014  . Inconclusive mammogram 03/15/2013  . Back ache 09/06/2012  . BP (high blood pressure) 09/06/2012  . Hypercholesterolemia 01/28/2012  . Climacteric 01/28/2012  . Routine general medical examination at a health care facility 01/28/2012  . Avitaminosis D 01/28/2012    Jomarie Longs  PT 09/23/2016, 12:35 PM  [facility] [address] [place], [address] Phone: [phone]   Fax:  [phone]  Name: Carly [name] MRN: [id number] Date of Birth: December 15, 1950

## 2016-09-24 ENCOUNTER — Encounter: Payer: Medicare Other | Admitting: Physical Therapy

## 2016-09-25 ENCOUNTER — Encounter: Payer: Medicare Other | Admitting: Physical Therapy

## 2016-09-29 ENCOUNTER — Ambulatory Visit: Payer: Medicare Other | Admitting: Physical Therapy

## 2016-10-02 ENCOUNTER — Ambulatory Visit: Payer: Medicare Other | Admitting: Physical Therapy

## 2016-10-08 ENCOUNTER — Ambulatory Visit: Payer: Medicare Other

## 2016-10-08 ENCOUNTER — Ambulatory Visit: Payer: Medicare Other | Admitting: Hematology and Oncology

## 2016-10-14 ENCOUNTER — Encounter: Payer: Medicare Other | Admitting: Internal Medicine

## 2016-10-15 ENCOUNTER — Inpatient Hospital Stay: Payer: Medicare Other

## 2016-10-15 ENCOUNTER — Inpatient Hospital Stay: Payer: Medicare Other | Attending: Hematology and Oncology | Admitting: Hematology and Oncology

## 2016-10-15 ENCOUNTER — Other Ambulatory Visit: Payer: Self-pay | Admitting: Hematology and Oncology

## 2016-10-15 VITALS — BP 132/91 | HR 84 | Temp 98.6°F | Resp 18 | Wt 162.0 lb

## 2016-10-15 VITALS — BP 130/84 | HR 109 | Temp 99.2°F | Resp 18

## 2016-10-15 DIAGNOSIS — Z79899 Other long term (current) drug therapy: Secondary | ICD-10-CM | POA: Diagnosis not present

## 2016-10-15 DIAGNOSIS — G35 Multiple sclerosis: Secondary | ICD-10-CM

## 2016-10-15 DIAGNOSIS — Z86010 Personal history of colonic polyps: Secondary | ICD-10-CM | POA: Diagnosis not present

## 2016-10-15 DIAGNOSIS — F329 Major depressive disorder, single episode, unspecified: Secondary | ICD-10-CM | POA: Diagnosis not present

## 2016-10-15 MED ORDER — DIPHENHYDRAMINE HCL 50 MG/ML IJ SOLN
25.0000 mg | Freq: Once | INTRAMUSCULAR | Status: AC
  Administered 2016-10-15: 25 mg via INTRAVENOUS

## 2016-10-15 MED ORDER — SODIUM CHLORIDE 0.9 % IV SOLN
600.0000 mg | Freq: Once | INTRAVENOUS | Status: AC
  Administered 2016-10-15: 600 mg via INTRAVENOUS
  Filled 2016-10-15: qty 20

## 2016-10-15 MED ORDER — METHYLPREDNISOLONE SODIUM SUCC 125 MG IJ SOLR
100.0000 mg | Freq: Once | INTRAMUSCULAR | Status: AC
  Administered 2016-10-15: 100 mg via INTRAVENOUS

## 2016-10-15 MED ORDER — ACETAMINOPHEN 500 MG PO TABS
1000.0000 mg | ORAL_TABLET | Freq: Once | ORAL | Status: AC
  Administered 2016-10-15: 1000 mg via ORAL

## 2016-10-15 NOTE — Progress Notes (Signed)
[facility] day:  10/15/16  Chief Complaint: Carly [name] is a 66 y.o. female with relapsing multiple sclerosis who is seen for 6 month assessment prior to cycle #2 Ocrevus (ocrelizumb).  HPI:  The patient was last seen by me in the medical oncology clinic on 04/09/2016.  At that time, she was seen for initial assessment prior to initiation of Octrevus.  She denied any loss of function.  She received Octrevus 300 mg IV on 04/11/2016 and 04/25/2016.  Premedications included Solumedrol 100 mg and Benadryl 25 mg.  She tolerated her infusions well.  During the interim, she notes that she has more energy.  She is walking better.  She comments that on Christmas day, she could hardly do anything and hardly walk.  She denies any sinus symptoms.  She has a follow-up appointment with Dr. Effie Shy, her neurologist, on 11/07/2016.   Past Medical History:  Diagnosis Date  . Allergy   . Depression   . Frequent headaches    H/O  . History of chicken pox   . History of colon polyps   . Hx of migraines   . Multiple sclerosis ([place])   . PONV (postoperative nausea and vomiting)     Past Surgical History:  Procedure Laterality Date  . FOOT SURGERY  2015  . GALLBLADDER SURGERY  2008  . HARDWARE REMOVAL Left 02/14/2016   Procedure: LEFT FOOT REMOVAL DEEP IMPLANT;  Surgeon: [name], MD;  Location: [facility];  Service: Orthopedics;  Laterality: Left;  . SPINE SURGERY  2014    Family History  Problem Relation Age of Onset  . Arthritis Mother   . Hypertension Mother   . Hypertension Father   . Hyperlipidemia Father   . Heart disease Maternal Grandfather   . Diabetes Maternal Grandfather   . Kidney disease Paternal Grandmother     Social History:  reports that she has never smoked. She has never used smokeless tobacco. She reports that she does not drink alcohol or use drugs.  She lives in [place].  The patient is alone  today.  Allergies:  Allergies  Allergen Reactions  . Asa [Aspirin] Swelling  . Buspirone Other (See Comments)    Headaches    . Levofloxacin Other (See Comments)    "weakness"  . Lexapro [Escitalopram Oxalate] Other (See Comments)    Weakness   . Motrin [Ibuprofen] Swelling  . Ceftin [Cefuroxime Axetil] Rash  . Penicillins Rash  . Sulfa Antibiotics Rash    Current Medications: Current Outpatient Prescriptions  Medication Sig Dispense Refill  . acetaminophen (TYLENOL) 325 MG tablet Take 650 mg by mouth every 4 (four) hours as needed.    . ALPRAZolam (XANAX) 0.5 MG tablet Take by mouth. Reported on 04/09/2016    . B Complex Vitamins (VITAMIN B COMPLEX PO) Take by mouth.    Marland Kitchen CALCIUM PO Take 400 mg by mouth daily.    . Calcium-Phosphorus-Vitamin D (CITRACAL +D3 PO) Take by mouth. 2 tablets twice a day    . Cholecalciferol (D3-1000 PO) Take by mouth.    . Coenzyme Q10 (CO Q10) 100 MG CAPS Take by mouth.    . dalfampridine 10 MG TB12 Take 10 mg by mouth every 12 (twelve) hours.    . DULoxetine (CYMBALTA) 30 MG capsule Take 60 mg by mouth daily.  0  . Multiple Vitamins-Minerals (CENTRUM SILVER ULTRA WOMENS PO) Take by mouth.    . nystatin cream (MYCOSTATIN) Apply  1 application topically 2 (two) times daily. 30 g 0  . ocrelizumab (OCREVUS) 300 MG/10ML injection Inject into the vein every 6 (six) months.    . pseudoephedrine (SUDAFED) 30 MG tablet Take 30 mg by mouth every 6 (six) hours as needed for congestion.    . sertraline (ZOLOFT) 100 MG tablet Take 200 mg by mouth daily.    . sodium chloride (V-R NASAL SPRAY SALINE) 0.65 % nasal spray Place into the nose.    Marland Kitchen tiZANidine (ZANAFLEX) 4 MG capsule Take 1 capsule (4 mg total) by mouth 3 (three) times daily. 21 capsule 0  . vitamin B-12 (CYANOCOBALAMIN) 1000 MCG tablet Take 1,000 mcg by mouth daily.     No current facility-administered medications for this visit.    Facility-Administered Medications Ordered in Other Visits   Medication Dose Route Frequency Provider Last Rate Last Dose  . 0.9 %  sodium chloride infusion   Intravenous Continuous Lequita Asal, MD 10 mL/hr at 04/25/16 0855    . acetaminophen (TYLENOL) tablet 650 mg  650 mg Oral Once Lequita Asal, MD      . diphenhydrAMINE (BENADRYL) injection 25 mg  25 mg Intravenous Once Lequita Asal, MD      . ocrelizumab (OCREVUS) 600 mg in sodium chloride 0.9 % 500 mL  600 mg Intravenous Once Lequita Asal, MD        Review of Systems:  GENERAL:  More energy.  No fevers or sweats.  Weight loss 6 pounds. PERFORMANCE STATUS (ECOG):  2 HEENT:  No visual changes, runny nose, sore throat, mouth sores or tenderness. Lungs: No shortness of breath or cough.  No hemoptysis. Cardiac:  No chest pain, palpitations, orthopnea, or PND. GI:  Constipation.  No nausea, vomiting, diarrhea, melena or hematochezia.  Up-to-date on colonoscopy. GU:  No urgency, frequency, dysuria, or hematuria. Musculoskeletal:  Back problems due to scoliosis.  Neck pain.  No joint pain.  No muscle tenderness. Extremities:  No pain or swelling. Skin:  No rashes or skin changes. Neuro:  General weakness.  Unsteady gait.  No headache, numbness or focal weakness. Endocrine:  No diabetes, thyroid issues, hot flashes or night sweats. Psych:  Anxiety.  No depression. Pain:  No focal pain. Review of systems:  All other systems reviewed and found to be negative.   Physical Exam: Blood pressure 140/85, pulse 88, temperature 98.6 F (37.0 C), temperature source Tympanic, resp. rate 18, height 5\' 3"  (1.6 m), weight 162 lb (73.5 kg). GENERAL:  Well developed, well nourished, sitting comfortably in the infusion room in no acute distress. MENTAL STATUS:  Alert and oriented to person, place and time. HEAD:  Short styled brown hair.  Normocephalic, atraumatic, face symmetric, no Cushingoid features. EYES:  Brown eyes.  Pupils equal round and reactive to light and accomodation.  No  conjunctivitis or scleral icterus. ENT:  Oropharynx clear without lesion.  Patchy tongue. Mucous membranes moist.  RESPIRATORY:  Clear to auscultation without rales, wheezes or rhonchi. CARDIOVASCULAR:  Regular rate and rhythm without murmur, rub or gallop. ABDOMEN:  Soft, non-tender, with active bowel sounds, and no hepatosplenomegaly.  No masses. SKIN:  No rashes or ulcers.  EXTREMITIES: Trace bilateral ankle edema.  No skin discoloration or tenderness.  No palpable cords. LYMPH NODES: No palpable cervical, supraclavicular, axillary or inguinal adenopathy  NEUROLOGICAL: Alert & oriented, cranial nerves II-XII intact; motor strength symmetric; sensation intact. PSYCH:  Appropriate.  No visits with results within 3 Day(s) from this visit.  Latest known visit with results is:  Lab on 07/15/2016  Component Date Value Ref Range Status  . Cholesterol 07/15/2016 255* 0 - 200 mg/dL Final  . Triglycerides 07/15/2016 173.0* 0.0 - 149.0 mg/dL Final  . HDL 07/15/2016 73.10  >39.00 mg/dL Final  . VLDL 07/15/2016 34.6  0.0 - 40.0 mg/dL Final  . LDL Cholesterol 07/15/2016 148* 0 - 99 mg/dL Final  . Total CHOL/HDL Ratio 07/15/2016 3   Final  . NonHDL 07/15/2016 182.21   Final  . Total Bilirubin 07/15/2016 0.4  0.2 - 1.2 mg/dL Final  . Bilirubin, Direct 07/15/2016 0.1  0.0 - 0.3 mg/dL Final  . Alkaline Phosphatase 07/15/2016 73  39 - 117 U/L Final  . AST 07/15/2016 20  0 - 37 U/L Final  . ALT 07/15/2016 19  0 - 35 U/L Final  . Total Protein 07/15/2016 7.3  6.0 - 8.3 g/dL Final  . Albumin 07/15/2016 4.5  3.5 - 5.2 g/dL Final  . Hgb A1c MFr Bld 07/15/2016 5.6  4.6 - 6.5 % Final  . Sodium 07/15/2016 141  135 - 145 mEq/L Final  . Potassium 07/15/2016 4.1  3.5 - 5.1 mEq/L Final  . Chloride 07/15/2016 104  96 - 112 mEq/L Final  . CO2 07/15/2016 27  19 - 32 mEq/L Final  . Glucose, Bld 07/15/2016 106* 70 - 99 mg/dL Final  . BUN 07/15/2016 8  6 - 23 mg/dL Final  . Creatinine, Ser 07/15/2016 0.69  0.40 -  1.20 mg/dL Final  . Calcium 07/15/2016 9.6  8.4 - 10.5 mg/dL Final  . GFR 07/15/2016 90.47  >60.00 mL/min Final    Assessment:  Charrie TURQUOISE BATTISTA is a 66 y.o. female with relapsing multiple sclerosis for 4 years.  She received Tysabri monthly (last 02/13/2016).  She has had stability in function.  She began Ocrevus on 04/11/2016.  Hepatitis testing was negative.  She developed herpes zoster on 03/14/2016.   Symptomatically, she denies any new complaints.  Exam is stable.  Plan: 1.  Discuss interim events.  Discuss plan to continue Octrevus per Dr. Effie Shy.  She will receive Octrevus every 6 months.  She will receive the same premedications. 2.  Ocrevus 600 mg IV today. 3.  Follow-up as scheduled with Dr. Jacqulynn Cadet. 4.  RTC in 6 months for MD assessment and Ocrevus.   Lequita Asal, MD  10/15/2016, 11:08 AM

## 2016-10-15 NOTE — Patient Instructions (Signed)
Completed Ocrevus infusion without any infusion reaction while in clinic. Instructed to call her neurologist for any side effects or concerns post treatment.

## 2016-10-24 ENCOUNTER — Telehealth: Payer: Self-pay | Admitting: *Deleted

## 2016-10-24 NOTE — Telephone Encounter (Signed)
Called patient back and informed her to notify her neurologist or PCP with her "feeling bad" after her MS infusion as they would be aware of the drug information and side effects and that she may have something else going on.

## 2016-11-17 ENCOUNTER — Telehealth: Payer: Self-pay | Admitting: Internal Medicine

## 2016-11-17 ENCOUNTER — Encounter: Payer: Medicare Other | Admitting: Internal Medicine

## 2016-11-17 NOTE — Telephone Encounter (Signed)
FYI - pt spouse called and cancelled appt. PT has been up all night with some stomach problems.

## 2016-12-09 ENCOUNTER — Encounter: Payer: Self-pay | Admitting: Hematology and Oncology

## 2016-12-12 ENCOUNTER — Ambulatory Visit: Payer: Medicare Other

## 2016-12-15 ENCOUNTER — Ambulatory Visit (INDEPENDENT_AMBULATORY_CARE_PROVIDER_SITE_OTHER): Payer: Medicare Other | Admitting: Internal Medicine

## 2016-12-15 ENCOUNTER — Encounter: Payer: Self-pay | Admitting: Internal Medicine

## 2016-12-15 ENCOUNTER — Other Ambulatory Visit (HOSPITAL_COMMUNITY)
Admission: RE | Admit: 2016-12-15 | Discharge: 2016-12-15 | Disposition: A | Discharge status: Home / Self Care | Payer: Medicare Other | Source: Ambulatory Visit | Attending: Internal Medicine | Admitting: Internal Medicine

## 2016-12-15 VITALS — BP 128/78 | HR 99 | Temp 98.5°F | Wt 165.5 lb

## 2016-12-15 DIAGNOSIS — E7800 Pure hypercholesterolemia, unspecified: Secondary | ICD-10-CM | POA: Diagnosis not present

## 2016-12-15 DIAGNOSIS — Z01419 Encounter for gynecological examination (general) (routine) without abnormal findings: Secondary | ICD-10-CM | POA: Insufficient documentation

## 2016-12-15 DIAGNOSIS — Z23 Encounter for immunization: Secondary | ICD-10-CM

## 2016-12-15 DIAGNOSIS — F32A Depression, unspecified: Secondary | ICD-10-CM

## 2016-12-15 DIAGNOSIS — G35 Multiple sclerosis: Secondary | ICD-10-CM

## 2016-12-15 DIAGNOSIS — Z1151 Encounter for screening for human papillomavirus (HPV): Secondary | ICD-10-CM | POA: Insufficient documentation

## 2016-12-15 DIAGNOSIS — F329 Major depressive disorder, single episode, unspecified: Secondary | ICD-10-CM

## 2016-12-15 DIAGNOSIS — Z0000 Encounter for general adult medical examination without abnormal findings: Secondary | ICD-10-CM

## 2016-12-15 NOTE — Progress Notes (Signed)
Pre visit review using our clinic review tool, if applicable. No additional management support is needed unless otherwise documented below in the visit note. 

## 2016-12-15 NOTE — Assessment & Plan Note (Addendum)
Physical today 12/15/16.  PAP 2013 - negative with negative HPV.  Mammogram 03/07/16 - Birads I.

## 2016-12-15 NOTE — Progress Notes (Signed)
Patient ID: Carly [name], female   DOB: [birth year], 66 y.o.   MRN: [id number]   Subjective:    Patient ID: Carly [name], female    DOB: 08/03/1951, 66 y.o.   MRN: [id number]  HPI  Patient here for her physical exam.  Has MS.  Sees Dr Mike Gip.  On octrevus q 6 momths.  Is evaluated q 6 months. Stable.  She is now seeing [name].  Used to see her for years.  Back to seeing her again.  She is off abilify.  Is in the process of adjusting her medications.  Taking 30mg  of cymbalta.  On zoloft.  No chest pain.  Breathing stable.  States if she could get her psych meds adjusted, she feels she is doing well.  No abdominal pain.  Bowels moving.  Gets anxious when comes in for appts.  More calm now.     Past Medical History:  Diagnosis Date  . Allergy   . Depression   . Frequent headaches    H/O  . History of chicken pox   . History of colon polyps   . Hx of migraines   . Multiple sclerosis ([place])   . PONV (postoperative nausea and vomiting)    Past Surgical History:  Procedure Laterality Date  . FOOT SURGERY  2015  . GALLBLADDER SURGERY  2008  . HARDWARE REMOVAL Left 02/14/2016   Procedure: LEFT FOOT REMOVAL DEEP IMPLANT;  Surgeon: [name], MD;  Location: [facility];  Service: Orthopedics;  Laterality: Left;  . SPINE SURGERY  2014   Family History  Problem Relation Age of Onset  . Arthritis Mother   . Hypertension Mother   . Hypertension Father   . Hyperlipidemia Father   . Heart disease Maternal Grandfather   . Diabetes Maternal Grandfather   . Kidney disease Paternal Grandmother    Social History   Social History  . Marital status: Married    Spouse name: N/A  . Number of children: N/A  . Years of education: N/A   Social History Main Topics  . Smoking status: Never Smoker  . Smokeless tobacco: Never Used  . Alcohol use No  . Drug use: No  . Sexual activity: Not Asked   Other Topics Concern  . None   Social History Narrative  . None     Outpatient Encounter Prescriptions as of 12/15/2016  Medication Sig  . acetaminophen (TYLENOL) 325 MG tablet Take 650 mg by mouth every 4 (four) hours as needed.  . ALPRAZolam (XANAX) 0.5 MG tablet Take by mouth. Reported on 04/09/2016  . ARIPiprazole (ABILIFY) 5 MG tablet   . B Complex Vitamins (VITAMIN B COMPLEX PO) Take by mouth.  Marland Kitchen CALCIUM PO Take 400 mg by mouth daily.  . Calcium-Phosphorus-Vitamin D (CITRACAL +D3 PO) Take by mouth. 2 tablets twice a day  . Cholecalciferol (D3-1000 PO) Take by mouth.  . Coenzyme Q10 (CO Q10) 100 MG CAPS Take by mouth.  . dalfampridine 10 MG TB12 Take 10 mg by mouth every 12 (twelve) hours.  . Diphenhyd-Hydrocort-Nystatin (FIRST-DUKES MOUTHWASH) SUSP SWISH AND SPIT 10 ML 4 TIMES A DAY AS NEEDED FOR MOUTH AND THROAT IRRITATION  . DULoxetine (CYMBALTA) 30 MG capsule Take 60 mg by mouth daily.  . fluticasone (FLONASE) 50 MCG/ACT nasal spray   . Multiple Vitamins-Minerals (CENTRUM SILVER ULTRA WOMENS PO) Take by mouth.  . nystatin cream (MYCOSTATIN) Apply 1 application topically 2 (two) times daily.  Marland Kitchen ocrelizumab (OCREVUS) 300 MG/10ML  injection Inject into the vein every 6 (six) months.  Marland Kitchen omeprazole (PRILOSEC) 20 MG capsule Take 20 mg by mouth daily.  . pseudoephedrine (SUDAFED) 30 MG tablet Take 30 mg by mouth every 6 (six) hours as needed for congestion.  . sertraline (ZOLOFT) 100 MG tablet Take 200 mg by mouth daily.  . sodium chloride (V-R NASAL SPRAY SALINE) 0.65 % nasal spray Place into the nose.  Marland Kitchen tiZANidine (ZANAFLEX) 4 MG capsule Take 1 capsule (4 mg total) by mouth 3 (three) times daily.  . vitamin B-12 (CYANOCOBALAMIN) 1000 MCG tablet Take 1,000 mcg by mouth daily.   Facility-Administered Encounter Medications as of 12/15/2016  Medication  . 0.9 %  sodium chloride infusion  . acetaminophen (TYLENOL) tablet 650 mg    Review of Systems  Constitutional: Negative for appetite change and unexpected weight change.  HENT: Negative for  congestion and sinus pressure.   Eyes: Negative for pain and visual disturbance.  Respiratory: Negative for cough, chest tightness and shortness of breath.   Cardiovascular: Negative for chest pain, palpitations and leg swelling.  Gastrointestinal: Negative for abdominal pain, diarrhea, nausea and vomiting.  Genitourinary: Negative for difficulty urinating and dysuria.  Musculoskeletal: Negative for back pain and joint swelling.  Skin: Negative for color change and rash.  Neurological: Negative for dizziness, light-headedness and headaches.  Hematological: Negative for adenopathy. Does not bruise/bleed easily.  Psychiatric/Behavioral: Negative for agitation and dysphoric mood.        Has anxiety issues as outlined.         Objective:     Blood pressure rechecked by me:  128/78  Physical Exam  Constitutional: She is oriented to person, place, and time. She appears well-developed and well-nourished. No distress.  HENT:  Nose: Nose normal.  Mouth/Throat: Oropharynx is clear and moist.  Eyes: Right eye exhibits no discharge. Left eye exhibits no discharge. No scleral icterus.  Neck: Neck supple. No thyromegaly present.  Cardiovascular: Normal rate and regular rhythm.   Pulmonary/Chest: Breath sounds normal. No accessory muscle usage. No tachypnea. No respiratory distress. She has no decreased breath sounds. She has no wheezes. She has no rhonchi. Right breast exhibits no inverted nipple, no mass, no nipple discharge and no tenderness (no axillary adenopathy). Left breast exhibits no inverted nipple, no mass, no nipple discharge and no tenderness (no axilarry adenopathy).  Abdominal: Soft. Bowel sounds are normal. There is no tenderness.  Musculoskeletal: She exhibits no edema or tenderness.  Lymphadenopathy:    She has no cervical adenopathy.  Neurological: She is alert and oriented to person, place, and time.  Skin: Skin is warm. No rash noted. No erythema.  Psychiatric: She has a  normal mood and affect. Her behavior is normal.    BP 128/78   Pulse 99   Temp 98.5 F (36.9 C) (Oral)   Wt 165 lb 8 oz (75.1 kg)   SpO2 99%   BMI 29.32 kg/m  Wt Readings from Last 3 Encounters:  12/15/16 165 lb 8 oz (75.1 kg)  10/15/16 162 lb (73.5 kg)  09/02/16 168 lb (76.2 kg)     Lab Results  Component Value Date   WBC 7.6 12/20/2015   HGB 13.6 12/20/2015   HCT 39.7 12/20/2015   PLT 285.0 12/20/2015   GLUCOSE 106 (H) 07/15/2016   CHOL 255 (H) 07/15/2016   TRIG 173.0 (H) 07/15/2016   HDL 73.10 07/15/2016   LDLDIRECT 138.0 12/20/2015   LDLCALC 148 (H) 07/15/2016   ALT 19 07/15/2016  AST 20 07/15/2016   NA 141 07/15/2016   K 4.1 07/15/2016   CL 104 07/15/2016   CREATININE 0.69 07/15/2016   BUN 8 07/15/2016   CO2 27 07/15/2016   TSH 1.31 12/20/2015   HGBA1C 5.6 07/15/2016       Assessment & Plan:   Problem List Items Addressed This Visit    Depression    Seeing [name].  In the process of adjusting medication.  Follow.        Health care maintenance    Physical today 12/15/16.  PAP 2013 - negative with negative HPV.  Mammogram 03/07/16 - [facility] I.        Relevant Orders   Cytology - PAP (Completed)   Hypercholesterolemia    Low cholesterol diet and exercise.  Follow lipid panel.        Multiple sclerosis ([place])    Followed by Dr Mike Gip.  Receiving ocrevus.         Other Visit Diagnoses    Need for pneumococcal vaccination    -  Primary       Einar Pheasant, MD

## 2016-12-19 LAB — CYTOLOGY - PAP
DIAGNOSIS: NEGATIVE
HPV: NOT DETECTED

## 2016-12-21 ENCOUNTER — Encounter: Payer: Self-pay | Admitting: Internal Medicine

## 2016-12-21 NOTE — Assessment & Plan Note (Signed)
Followed by Dr Mike Gip.  Receiving ocrevus.

## 2016-12-21 NOTE — Assessment & Plan Note (Signed)
Seeing [name].  In the process of adjusting medication.  Follow.

## 2016-12-21 NOTE — Assessment & Plan Note (Signed)
Low cholesterol diet and exercise.  Follow lipid panel.   

## 2017-01-28 ENCOUNTER — Ambulatory Visit: Payer: Medicare Other

## 2017-01-28 ENCOUNTER — Other Ambulatory Visit: Payer: Medicare Other

## 2017-02-24 ENCOUNTER — Ambulatory Visit: Payer: Medicare Other

## 2017-02-24 ENCOUNTER — Telehealth: Payer: Self-pay | Admitting: Internal Medicine

## 2017-02-24 ENCOUNTER — Emergency Department
Admission: EM | Admit: 2017-02-24 | Discharge: 2017-02-24 | Disposition: A | Discharge status: Home / Self Care | Payer: Medicare Other | Attending: Emergency Medicine | Admitting: Emergency Medicine

## 2017-02-24 ENCOUNTER — Encounter: Payer: Self-pay | Admitting: Emergency Medicine

## 2017-02-24 ENCOUNTER — Emergency Department: Payer: Medicare Other

## 2017-02-24 ENCOUNTER — Other Ambulatory Visit: Payer: Medicare Other

## 2017-02-24 DIAGNOSIS — Z79899 Other long term (current) drug therapy: Secondary | ICD-10-CM | POA: Diagnosis not present

## 2017-02-24 DIAGNOSIS — R51 Headache: Secondary | ICD-10-CM | POA: Diagnosis not present

## 2017-02-24 DIAGNOSIS — R519 Headache, unspecified: Secondary | ICD-10-CM

## 2017-02-24 HISTORY — DX: Anxiety disorder, unspecified: F41.9

## 2017-02-24 LAB — CBC WITH DIFFERENTIAL/PLATELET
BASOS ABS: 0.1 10*3/uL (ref 0–0.1)
BASOS PCT: 1 %
EOS ABS: 0.1 10*3/uL (ref 0–0.7)
EOS PCT: 1 %
HCT: 44 % (ref 35.0–47.0)
Hemoglobin: 14.9 g/dL (ref 12.0–16.0)
Lymphocytes Relative: 17 %
Lymphs Abs: 1.5 10*3/uL (ref 1.0–3.6)
MCH: 28.2 pg (ref 26.0–34.0)
MCHC: 33.9 g/dL (ref 32.0–36.0)
MCV: 83.1 fL (ref 80.0–100.0)
Monocytes Absolute: 0.6 10*3/uL (ref 0.2–0.9)
Monocytes Relative: 7 %
Neutro Abs: 6.5 10*3/uL (ref 1.4–6.5)
Neutrophils Relative %: 74 %
PLATELETS: 314 10*3/uL (ref 150–440)
RBC: 5.3 MIL/uL — ABNORMAL HIGH (ref 3.80–5.20)
RDW: 14.7 % — AB (ref 11.5–14.5)
WBC: 8.8 10*3/uL (ref 3.6–11.0)

## 2017-02-24 LAB — URINALYSIS, COMPLETE (UACMP) WITH MICROSCOPIC
BILIRUBIN URINE: NEGATIVE
Bacteria, UA: NONE SEEN
GLUCOSE, UA: NEGATIVE mg/dL
HGB URINE DIPSTICK: NEGATIVE
KETONES UR: NEGATIVE mg/dL
LEUKOCYTES UA: NEGATIVE
NITRITE: NEGATIVE
PH: 7 (ref 5.0–8.0)
Protein, ur: NEGATIVE mg/dL
Specific Gravity, Urine: 1.004 — ABNORMAL LOW (ref 1.005–1.030)

## 2017-02-24 LAB — COMPREHENSIVE METABOLIC PANEL
ALBUMIN: 4.8 g/dL (ref 3.5–5.0)
ALT: 21 U/L (ref 14–54)
AST: 27 U/L (ref 15–41)
Alkaline Phosphatase: 81 U/L (ref 38–126)
Anion gap: 8 (ref 5–15)
BUN: 7 mg/dL (ref 6–20)
CALCIUM: 10.2 mg/dL (ref 8.9–10.3)
CO2: 26 mmol/L (ref 22–32)
Chloride: 104 mmol/L (ref 101–111)
Creatinine, Ser: 0.64 mg/dL (ref 0.44–1.00)
GFR calc Af Amer: >60 mL/min (ref >60)
GFR calc non Af Amer: >60 mL/min (ref >60)
Glucose, Bld: 108 mg/dL — ABNORMAL HIGH (ref 65–99)
POTASSIUM: 3.9 mmol/L (ref 3.5–5.1)
Sodium: 138 mmol/L (ref 135–145)
TOTAL PROTEIN: 7.6 g/dL (ref 6.5–8.1)
Total Bilirubin: 0.3 mg/dL (ref 0.3–1.2)

## 2017-02-24 NOTE — Telephone Encounter (Signed)
Pt called about needing someone to talk to she feels like she's going to loose her mind. Pt asked if she can be admitted into the hospital to drain out the medication that's in her system. Pt was crying on the phone and she feels like she's going loose it. Medication name is DULoxetine (CYMBALTA) 30 MG capsule and ARIPiprazole (ABILIFY) 5 MG tablet. She stopped taking these meds. Pt states she does not know where to turn to. Please advise? Pt was transferred to [facility].  Call pt @ [phone]. Thank you!

## 2017-02-24 NOTE — Telephone Encounter (Signed)
Patient stat`ed she will see Dr. Caryl Bis, but really would like to see Dr. Nicki Reaper Patient staetd since coming off Abilify and Cymbalta and has had nausea and headache and just  Feels she cannot handle the feelings , she feels full of anxiety, saw Dr. [name] and was prescribed Aplenzin (Buproprion) she feels like she needs to be in the hospital to get these medications out of her.  Patient  Stated she is not going to harm herself but she wants the medications out of her. Right now patient is only taking Zoloft. Started Aplenzin on March 7 th and just stopped 3 days ago. The headaches are like electricity going through her head, and the nausea is separate from the headache.

## 2017-02-24 NOTE — ED Provider Notes (Signed)
[facility] Emergency Department Provider Note  ____________________________________________   First MD Initiated Contact with Patient 02/24/17 1842     (approximate)  I have reviewed the triage vital signs and the nursing notes.   HISTORY  Chief Complaint Headache   HPI Carly [name] is a 66 y.o. female with a history of multiple sclerosis as well as depression who is been having on and off headaches over the past several months. She says that she has had adjustments in several of her psychiatric medications including Abilify and Cymbalta. She says that when she has been taken off of both of these medications she isn't up with headaches to the front of her head. She denies any headache, nausea or anxiety at this time. However, she says that her symptoms seem to be worsening as time has gone on over the past several months. She thinks it is related to her medications. She was sent to the emergency department today for further evaluation. She says when the headaches occur they can occur gradually and are to the front of the head. She describes them as a burning type of pain. Does not report any blurred vision or temporal pain.   Past Medical History:  Diagnosis Date  . Allergy   . Anxiety   . Depression   . Frequent headaches    H/O  . History of chicken pox   . History of colon polyps   . Hx of migraines   . Multiple sclerosis ([place])   . PONV (postoperative nausea and vomiting)     Patient Active Problem List   Diagnosis Date Noted  . Lower extremity edema 01/13/2016  . Bilateral ovarian cysts 10/16/2015  . Health care maintenance 10/16/2015  . Sinusitis 03/12/2015  . Colon polyp 01/25/2015  . Bone/cartilage disorder 01/25/2015  . Headache 11/18/2014  . Depression 11/18/2014  . Greater tuberosity of humerus fracture 11/18/2014  . Multiple sclerosis ([place]) 11/18/2014  . Unsteady gait 11/18/2014  . Dizziness 11/18/2014  . Inconclusive  mammogram 03/15/2013  . Back ache 09/06/2012  . BP (high blood pressure) 09/06/2012  . Hypercholesterolemia 01/28/2012  . Climacteric 01/28/2012  . Routine general medical examination at a health care facility 01/28/2012  . Avitaminosis D 01/28/2012    Past Surgical History:  Procedure Laterality Date  . CHOLECYSTECTOMY    . FOOT SURGERY  2015  . GALLBLADDER SURGERY  2008  . HARDWARE REMOVAL Left 02/14/2016   Procedure: LEFT FOOT REMOVAL DEEP IMPLANT;  Surgeon: [name], MD;  Location: [facility];  Service: Orthopedics;  Laterality: Left;  . SPINE SURGERY  2014    Prior to Admission medications   Medication Sig Start Date End Date Taking? Authorizing Provider  acetaminophen (TYLENOL) 325 MG tablet Take 650 mg by mouth every 4 (four) hours as needed.    [provider]  ALPRAZolam Duanne Moron) 0.5 MG tablet Take by mouth. Reported on 04/09/2016    [provider]  ARIPiprazole (ABILIFY) 5 MG tablet  09/21/16   [provider]  B Complex Vitamins (VITAMIN B COMPLEX PO) Take by mouth.    [provider]  CALCIUM PO Take 400 mg by mouth daily.    [provider]  Calcium-Phosphorus-Vitamin D (CITRACAL +D3 PO) Take by mouth. 2 tablets twice a day    [provider]  Cholecalciferol (D3-1000 PO) Take by mouth.    [provider]  Coenzyme Q10 (CO Q10) 100 MG CAPS Take by mouth.  [provider]  dalfampridine 10 MG TB12 Take 10 mg by mouth every 12 (twelve) hours.    [provider]  Diphenhyd-Hydrocort-Nystatin (FIRST-DUKES MOUTHWASH) SUSP SWISH AND SPIT 10 ML 4 TIMES A DAY AS NEEDED FOR MOUTH AND THROAT IRRITATION 08/28/16   [provider]  DULoxetine (CYMBALTA) 30 MG capsule Take 60 mg by mouth daily. 02/27/15   [provider]  fluticasone Asencion Islam) 50 MCG/ACT nasal spray  09/07/16   [provider]  Multiple Vitamins-Minerals (CENTRUM SILVER ULTRA WOMENS PO) Take by  mouth.    [provider]  nystatin cream (MYCOSTATIN) Apply 1 application topically 2 (two) times daily. 07/09/16   Einar Pheasant, MD  ocrelizumab (OCREVUS) 300 MG/10ML injection Inject into the vein every 6 (six) months.    [provider]  omeprazole (PRILOSEC) 20 MG capsule Take 20 mg by mouth daily. 08/25/16   [provider]  pseudoephedrine (SUDAFED) 30 MG tablet Take 30 mg by mouth every 6 (six) hours as needed for congestion.    [provider]  sertraline (ZOLOFT) 100 MG tablet Take 200 mg by mouth daily.    [provider]  sodium chloride (V-R NASAL SPRAY SALINE) 0.65 % nasal spray Place into the nose.    [provider]  tiZANidine (ZANAFLEX) 4 MG capsule Take 1 capsule (4 mg total) by mouth 3 (three) times daily. 09/02/16   Lorin Picket, PA-C  vitamin B-12 (CYANOCOBALAMIN) 1000 MCG tablet Take 1,000 mcg by mouth daily.    [provider]    Allergies Asa [aspirin]; Buspirone; Levofloxacin; Lexapro [escitalopram oxalate]; Motrin [ibuprofen]; Soy allergy; Ceftin [cefuroxime axetil]; Penicillins; and Sulfa antibiotics  Family History  Problem Relation Age of Onset  . Arthritis Mother   . Hypertension Mother   . Hypertension Father   . Hyperlipidemia Father   . Heart disease Maternal Grandfather   . Diabetes Maternal Grandfather   . Kidney disease Paternal Grandmother     Social History Social History  Substance Use Topics  . Smoking status: Never Smoker  . Smokeless tobacco: Never Used  . Alcohol use No    Review of Systems  Constitutional: No fever/chills Eyes: No visual changes. ENT: No sore throat. Cardiovascular: Denies chest pain. Respiratory: Denies shortness of breath. Gastrointestinal: No abdominal pain.  No nausea, no vomiting.  No diarrhea.  No constipation. Genitourinary: Negative for dysuria. Musculoskeletal: Negative for back pain. Skin: Negative for rash. Neurological: Negative  for focal weakness or numbness.   ____________________________________________   PHYSICAL EXAM:  VITAL SIGNS: ED Triage Vitals  Enc Vitals Group     BP 02/24/17 1636 (!) 158/76     Pulse Rate 02/24/17 1636 97     Resp 02/24/17 1636 16     Temp 02/24/17 1636 98.7 F (37.1 C)     Temp Source 02/24/17 1636 Oral     SpO2 02/24/17 1636 96 %     Weight 02/24/17 1638 160 lb (72.6 kg)     Height 02/24/17 1638 5\' 3"  (1.6 m)     Head Circumference --      Peak Flow --      Pain Score 02/24/17 1637 0     Pain Loc --      Pain Edu? --      Excl. in [place]? --     Constitutional: Alert and oriented. Well appearing and in no acute distress. Eyes: Conjunctivae are normal.  Head: Atraumatic. No tenderness to palpation or nodularity along the distribution  of the bilateral temporal arteries. Nose: No congestion/rhinnorhea. Mouth/Throat: Mucous membranes are moist.  Neck: No stridor.  No tenderness to palpation. Ranges freely. Cardiovascular: Normal rate, regular rhythm. Grossly normal heart sounds.   Respiratory: Normal respiratory effort.  No retractions. Lungs CTAB. Gastrointestinal: Soft and nontender. No distention. Musculoskeletal: No lower extremity tenderness nor edema.  No joint effusions. Neurologic:  Normal speech and language. No gross focal neurologic deficits are appreciated. Skin:  Skin is warm, dry and intact. No rash noted. Psychiatric: Mood and affect are normal. Speech and behavior are normal.  ____________________________________________   LABS (all labs ordered are listed, but only abnormal results are displayed)  Labs Reviewed  CBC WITH DIFFERENTIAL/PLATELET - Abnormal; Notable for the following:       Result Value   RBC 5.30 (*)    RDW 14.7 (*)    All other components within normal limits  COMPREHENSIVE METABOLIC PANEL - Abnormal; Notable for the following:    Glucose, Bld 108 (*)    All other components within normal limits  URINALYSIS, COMPLETE (UACMP) WITH  MICROSCOPIC - Abnormal; Notable for the following:    Color, Urine STRAW (*)    APPearance CLEAR (*)    Specific Gravity, Urine 1.004 (*)    Squamous Epithelial / LPF 0-5 (*)    All other components within normal limits   ____________________________________________  EKG   ____________________________________________  RADIOLOGY No change from previous. Findings consistent with multiple sclerosis.  ____________________________________________   PROCEDURES  Procedure(s) performed:   Procedures  Critical Care performed:   ____________________________________________   INITIAL IMPRESSION / ASSESSMENT AND PLAN / ED COURSE  Pertinent labs & imaging results that were available during my care of the patient were reviewed by me and considered in my medical decision making (see chart for details).  ----------------------------------------- 8:11 PM on 02/24/2017 -----------------------------------------  Patient continues to be a symptomatic. Reassuring workup here in the emergency department. Will follow up as an outpatient for further medication adjustments. Patient is understanding of the plan as well as the results and is willing to comply.  Highly unlikely to be giant cell arteritis. Ongoing headaches for months without any visual changes or temporal pain.      ____________________________________________   FINAL CLINICAL IMPRESSION(S) / ED DIAGNOSES  Headache.    NEW MEDICATIONS STARTED DURING THIS VISIT:  New Prescriptions   No medications on file     Note:  This document was prepared using Dragon voice recognition software and may include unintentional dictation errors.     Orbie Pyo, MD 02/24/17 2012

## 2017-02-24 NOTE — Telephone Encounter (Signed)
fyi

## 2017-02-24 NOTE — Telephone Encounter (Signed)
Patient is going to ER for evaluation and ER has been notified she is coming by car husband to bring. Canceled appointment with Dr. Caryl Bis.

## 2017-02-24 NOTE — Telephone Encounter (Signed)
[facility] Patient Name: Carly [name] DOB: October 18, 1950 Initial Comment Caller states, [name] from the office- patient is having mental changes, confusion, loosing mind due to and rx. Cymbalta and Abilfy. Nurse Assessment Nurse: Markus Daft, RN, Sherre Poot Date/Time (Eastern Time): 02/24/2017 11:14:02 AM Confirm and document reason for call. If symptomatic, describe symptoms. ---Caller states that she seems to be having withdrawals, nausea daily and headache off/on (denies one right now), and states that she feels frustrated. Feels r/t Cymbalta and Abilify. She's been off of Abilify for last 2 months (she had been on it for 14 months), and Cymbalta stopped 6-7 wks ago (she had been on it for 4-5 yrs). She wants to know what she can do to get these drugs "out of her system faster?" Aplenzin (Buproprion) on currently but stopped the Aplenzin today as her heart was racing. - RN advised that it can take longer to get these meds out of her system because of how long she has been on them, but there isn't anything that can be done to rush the process. Does the patient have any new or worsening symptoms? ---Yes Will a triage be completed? ---Yes Related visit to physician within the last 2 weeks? ---No Does the PT have any chronic conditions? (i.e. diabetes, asthma, etc.) ---Yes List chronic conditions. ---MS, Depression - Zoloft and Aplenzin (Buproprion) Is this a behavioral health or substance abuse call? ---No Guidelines Guideline Title Affirmed Question Affirmed Notes Anxiety and Panic Attack Symptoms interfere with work or school Final Disposition User See Physician within [place], Therapist, sports, [place] Comments Current s/s: nausea, anxiety. Denies heart palpiations and HA right now. Nothing available with Dr. Nicki Reaper. She really wants to see Dr. Nicki Reaper, but did accept an appt with Dr. Lenis Dickinson  for 1:15 pm Tomorrow.  Please call her if you can work her in to see Dr. Nicki Reaper. Referrals REFERRED TO PCP OFFICE Disagree/Comply: Comply

## 2017-02-24 NOTE — Telephone Encounter (Signed)
Checked patients chart she is at ED now.

## 2017-02-24 NOTE — Telephone Encounter (Signed)
See other message

## 2017-02-24 NOTE — Telephone Encounter (Signed)
She sees psychiatry for these medications and has seen psychiatry for years.  With the symptoms she is describing, I think she needs to notify her psychiatrist and f/u with psychiatry.  If acute issues today, then ok with ER evaluation by behavioral intake nurse.  Dr Chucky May is her psychiatrist in [place].  Needs to get in touch with her today.

## 2017-02-24 NOTE — ED Triage Notes (Signed)
Patient states she takes a lot of medication and has a lot of side effects.  Recently stopped taking Cymbalta and Abilify.  Began tapering off Abilify in January.  Patient has had headaches, neck aches, and nausea.  In March, began tapering off of Cymbalta.  Again patient experienced Headaches, neck aches, and nausea.  Has appointment with Dr. Nicki Reaper tomorrow, who recommended patient be seen through ED to medically clear.

## 2017-02-25 ENCOUNTER — Ambulatory Visit: Payer: Self-pay | Admitting: Family Medicine

## 2017-02-26 ENCOUNTER — Ambulatory Visit: Payer: Medicare Other

## 2017-03-06 ENCOUNTER — Other Ambulatory Visit: Payer: Self-pay | Admitting: Internal Medicine

## 2017-03-06 DIAGNOSIS — Z1231 Encounter for screening mammogram for malignant neoplasm of breast: Secondary | ICD-10-CM

## 2017-03-10 ENCOUNTER — Other Ambulatory Visit (INDEPENDENT_AMBULATORY_CARE_PROVIDER_SITE_OTHER): Payer: Medicare Other

## 2017-03-10 ENCOUNTER — Ambulatory Visit (INDEPENDENT_AMBULATORY_CARE_PROVIDER_SITE_OTHER): Payer: Medicare Other

## 2017-03-10 ENCOUNTER — Telehealth: Payer: Self-pay | Admitting: Radiology

## 2017-03-10 ENCOUNTER — Other Ambulatory Visit: Payer: Self-pay | Admitting: Internal Medicine

## 2017-03-10 VITALS — BP 136/82 | HR 97 | Temp 98.3°F | Resp 12 | Ht 63.0 in | Wt 164.1 lb

## 2017-03-10 DIAGNOSIS — F32A Depression, unspecified: Secondary | ICD-10-CM

## 2017-03-10 DIAGNOSIS — Z0000 Encounter for general adult medical examination without abnormal findings: Secondary | ICD-10-CM

## 2017-03-10 DIAGNOSIS — R531 Weakness: Secondary | ICD-10-CM | POA: Diagnosis not present

## 2017-03-10 DIAGNOSIS — F329 Major depressive disorder, single episode, unspecified: Secondary | ICD-10-CM

## 2017-03-10 DIAGNOSIS — E7800 Pure hypercholesterolemia, unspecified: Secondary | ICD-10-CM

## 2017-03-10 DIAGNOSIS — Z1159 Encounter for screening for other viral diseases: Secondary | ICD-10-CM

## 2017-03-10 DIAGNOSIS — R739 Hyperglycemia, unspecified: Secondary | ICD-10-CM | POA: Diagnosis not present

## 2017-03-10 LAB — BASIC METABOLIC PANEL
BUN: 13 mg/dL (ref 6–23)
CALCIUM: 10.2 mg/dL (ref 8.4–10.5)
CO2: 26 mEq/L (ref 19–32)
CREATININE: 0.79 mg/dL (ref 0.40–1.20)
Chloride: 105 mEq/L (ref 96–112)
GFR: 77.24 mL/min (ref >60.00)
Glucose, Bld: 115 mg/dL — ABNORMAL HIGH (ref 70–99)
Potassium: 4.8 mEq/L (ref 3.5–5.1)
Sodium: 142 mEq/L (ref 135–145)

## 2017-03-10 LAB — LIPID PANEL
Cholesterol: 254 mg/dL — ABNORMAL HIGH (ref 0–200)
HDL: 70.9 mg/dL (ref >39.00)
NonHDL: 182.63
Total CHOL/HDL Ratio: 4
Triglycerides: 201 mg/dL — ABNORMAL HIGH (ref 0.0–149.0)
VLDL: 40.2 mg/dL — ABNORMAL HIGH (ref 0.0–40.0)

## 2017-03-10 LAB — TSH: TSH: 1.43 u[IU]/mL (ref 0.35–4.50)

## 2017-03-10 LAB — HEPATIC FUNCTION PANEL
ALK PHOS: 73 U/L (ref 39–117)
ALT: 24 U/L (ref 0–35)
AST: 25 U/L (ref 0–37)
Albumin: 5 g/dL (ref 3.5–5.2)
BILIRUBIN DIRECT: 0.1 mg/dL (ref 0.0–0.3)
BILIRUBIN TOTAL: 0.4 mg/dL (ref 0.2–1.2)
Total Protein: 7.2 g/dL (ref 6.0–8.3)

## 2017-03-10 LAB — HEMOGLOBIN A1C: Hgb A1c MFr Bld: 5.4 % (ref 4.6–6.5)

## 2017-03-10 LAB — HEPATITIS C ANTIBODY: HCV Ab: NEGATIVE

## 2017-03-10 LAB — VITAMIN D 25 HYDROXY (VIT D DEFICIENCY, FRACTURES): VITD: 49.41 ng/mL (ref 30.00–100.00)

## 2017-03-10 LAB — VITAMIN B12: Vitamin B-12: 801 pg/mL (ref 211–911)

## 2017-03-10 LAB — LDL CHOLESTEROL, DIRECT: LDL DIRECT: 138 mg/dL

## 2017-03-10 NOTE — Telephone Encounter (Signed)
I have placed the orders for the labs.

## 2017-03-10 NOTE — Telephone Encounter (Signed)
PT seen Denisa for AWV and then had labs drawn. PT asked if she could have her TSH and VIT D drawn as well? Thank you.

## 2017-03-10 NOTE — Progress Notes (Signed)
Subjective:   Carly [name] is a 66 y.o. female who presents for an Initial Medicare Annual Wellness Visit.  Review of Systems    No ROS.  Medicare Wellness Visit.  Cardiac Risk Factors include: advanced age (>16men, >10 women);hypertension     Objective:    Today's Vitals   03/10/17 0938  BP: 136/82  Pulse: 97  Resp: 12  Temp: 98.3 F (36.8 C)  TempSrc: Oral  SpO2: 95%  Weight: 164 lb 1.9 oz (74.4 kg)  Height: 5\' 3"  (1.6 m)   Body mass index is 29.07 kg/m.   Current Medications (verified) Outpatient Encounter Prescriptions as of 03/10/2017  Medication Sig  . acetaminophen (TYLENOL) 325 MG tablet Take 650 mg by mouth every 4 (four) hours as needed.  . ALPRAZolam (XANAX) 0.5 MG tablet Take by mouth. Reported on 04/09/2016  . B Complex Vitamins (VITAMIN B COMPLEX PO) Take by mouth.  Marland Kitchen CALCIUM PO Take 400 mg by mouth daily.  . Calcium-Phosphorus-Vitamin D (CITRACAL +D3 PO) Take by mouth. 2 tablets twice a day  . Cholecalciferol (D3-1000 PO) Take by mouth.  . Coenzyme Q10 (CO Q10) 100 MG CAPS Take by mouth.  . dalfampridine 10 MG TB12 Take 10 mg by mouth every 12 (twelve) hours.  . fluticasone (FLONASE) 50 MCG/ACT nasal spray   . mirtazapine (REMERON) 15 MG tablet Take 15 mg by mouth at bedtime.  . Multiple Vitamins-Minerals (CENTRUM SILVER ULTRA WOMENS PO) Take by mouth.  Marland Kitchen ocrelizumab (OCREVUS) 300 MG/10ML injection Inject into the vein every 6 (six) months.  . pseudoephedrine (SUDAFED) 30 MG tablet Take 30 mg by mouth every 6 (six) hours as needed for congestion.  . sertraline (ZOLOFT) 100 MG tablet Take 200 mg by mouth daily.  . sodium chloride (V-R NASAL SPRAY SALINE) 0.65 % nasal spray Place into the nose.  Marland Kitchen tiZANidine (ZANAFLEX) 4 MG capsule Take 1 capsule (4 mg total) by mouth 3 (three) times daily.  . vitamin B-12 (CYANOCOBALAMIN) 1000 MCG tablet Take 1,000 mcg by mouth daily.  . [DISCONTINUED] ARIPiprazole (ABILIFY) 5 MG tablet   . [DISCONTINUED]  Diphenhyd-Hydrocort-Nystatin (FIRST-DUKES MOUTHWASH) SUSP SWISH AND SPIT 10 ML 4 TIMES A DAY AS NEEDED FOR MOUTH AND THROAT IRRITATION  . [DISCONTINUED] DULoxetine (CYMBALTA) 30 MG capsule Take 60 mg by mouth daily.  . [DISCONTINUED] omeprazole (PRILOSEC) 20 MG capsule Take 20 mg by mouth daily.  Marland Kitchen nystatin cream (MYCOSTATIN) Apply 1 application topically 2 (two) times daily. (Patient not taking: Reported on 03/10/2017)   Facility-Administered Encounter Medications as of 03/10/2017  Medication  . 0.9 %  sodium chloride infusion  . acetaminophen (TYLENOL) tablet 650 mg    Allergies (verified) Asa [aspirin]; Buspirone; Levofloxacin; Lexapro [escitalopram oxalate]; Motrin [ibuprofen]; Soy allergy; Ceftin [cefuroxime axetil]; Penicillins; and Sulfa antibiotics   History: Past Medical History:  Diagnosis Date  . Allergy   . Anxiety   . Depression   . Frequent headaches    H/O  . History of chicken pox   . History of colon polyps   . Hx of migraines   . Multiple sclerosis ([place])   . PONV (postoperative nausea and vomiting)    Past Surgical History:  Procedure Laterality Date  . CHOLECYSTECTOMY    . FOOT SURGERY  2015  . GALLBLADDER SURGERY  2008  . HARDWARE REMOVAL Left 02/14/2016   Procedure: LEFT FOOT REMOVAL DEEP IMPLANT;  Surgeon: [name], MD;  Location: [facility];  Service: Orthopedics;  Laterality: Left;  .  SPINE SURGERY  2014   Family History  Problem Relation Age of Onset  . Arthritis Mother   . Hypertension Mother   . Macular degeneration Mother   . Hypertension Father   . Hyperlipidemia Father   . Heart disease Maternal Grandfather   . Diabetes Maternal Grandfather   . Kidney disease Paternal Grandmother    Social History   Occupational History  . Not on file.   Social History Main Topics  . Smoking status: Never Smoker  . Smokeless tobacco: Never Used  . Alcohol use No  . Drug use: No  . Sexual activity: Not Currently    Tobacco  Counseling Counseling given: Not Answered   Activities of Daily Living In your present state of health, do you have any difficulty performing the following activities: 03/10/2017  Hearing? N  Vision? N  Difficulty concentrating or making decisions? N  Walking or climbing stairs? Y  Dressing or bathing? N  Doing errands, shopping? N  Preparing Food and eating ? Y  Using the Toilet? N  In the past six months, have you accidently leaked urine? N  Do you have problems with loss of bowel control? N  Managing your Medications? N  Managing your Finances? Y  Housekeeping or managing your Housekeeping? Y  Some recent data might be hidden    Immunizations and Health Maintenance Immunization History  Administered Date(s) Administered  . Influenza-Unspecified 08/27/2014, 08/14/2015  . Pneumococcal Conjugate-13 12/15/2016   Health Maintenance Due  Topic Date Due  . FOOT EXAM  07[birth year]15/1961  . OPHTHALMOLOGY EXAM  07/15/1960  . URINE MICROALBUMIN  07/15/1960  . TETANUS/TDAP  07/15/1969  . COLONOSCOPY  07/15/2000  . HEMOGLOBIN A1C  01/13/2017    Patient Care Team: Einar Pheasant, MD as PCP - General (Internal Medicine) Jacky Kindle., MD as Referring Physician (Psychiatry)  Indicate any recent Medical Services you may have received from other than [facility] providers in the past year (date may be approximate).     Assessment:   This is a routine wellness examination for Carly [name]. The goal of the wellness visit is to assist the patient how to close the gaps in care and create a preventative care plan for the patient.   Taking calcium VIT D as appropriate/Osteoporosis risk reviewed.  Medications reviewed; taking without issues or barriers.  Safety issues reviewed; smoke detectors in the home. No firearms in the home.  Wears seatbelts when driving or riding with others. Patient does wear sunscreen or protective clothing when in direct sunlight. No violence in the home.  Patient  is alert, normal appearance, oriented to person/place/and time. Correctly identified the president of the [place], recall of 3/3 words, and performing simple calculations.  Patient displays appropriate judgement and can read correct time from watch face.  No new identified risk were noted.  No failures at ADL's or IADL's.   BMI- discussed the importance of a healthy diet, water intake and exercise. Educational material provided.   24 hour diet: Breakfast: Oatmeal, fruit Lunch: Deli sandwich Dinner: [employer], green vegetable Daily fluid intake: 2 cups of caffeine, 4 cups of water  HTN- followed by PCP.  Dental- every 12 months.  Dr. Juleen China.  Sleep patterns- Sleeps 7-8 hours at night.  Wakes feeling rested.   Labs completed.  Hepatitis C Screening add on.  Hepatitis C screening discussed. Educational material provided.   TDAP vaccine deferred per patient preference.  Follow up with insurance.  Educational material provided.  Patient Concerns: None at  this time. Follow up with PCP as needed.  Hearing/Vision screen Hearing Screening Comments: Patient is able to hear conversational tones without difficulty.  No issues reported.   Vision Screening Comments: [facility] Wears corrective lenses Last OV 2017 Visual acuity not assessed per patient preference since they have regular follow up with the ophthalmologist  Dietary issues and exercise activities discussed: Current Exercise Habits: Home exercise routine, Type of exercise: stretching;calisthenics (Home physical therapy), Time (Minutes): 60, Frequency (Times/Week): 2, Weekly Exercise (Minutes/Week): 120, Intensity: Mild  Goals    . Increase physical activity          As tolerated      Depression Screen PHQ 2/9 Scores 03/10/2017 07/09/2016  PHQ - 2 Score 2 0  PHQ- 9 Score 2 -    Fall Risk Fall Risk  03/10/2017 07/09/2016  Falls in the past year? No No    Cognitive Function: MMSE - Mini Mental  State Exam 03/10/2017  Orientation to time 5  Orientation to Place 5  Registration 3  Attention/ Calculation 5  Recall 3  Language- name 2 objects 2  Language- repeat 1  Language- follow 3 step command 3  Language- read & follow direction 1  Write a sentence 1  Copy design 1  Total score 30        Screening Tests Health Maintenance  Topic Date Due  . FOOT EXAM  07/15[birth year]1960  . OPHTHALMOLOGY EXAM  07/15/1960  . URINE MICROALBUMIN  07/15/1960  . TETANUS/TDAP  07/15/1969  . COLONOSCOPY  07/15/2000  . HEMOGLOBIN A1C  01/13/2017  . INFLUENZA VACCINE  01/08/2018 (Originally 05/13/2017)  . PNA vac Low Risk Adult (2 of 2 - PPSV23) 12/15/2017  . MAMMOGRAM  03/06/2018  . DEXA SCAN  Completed  . Hepatitis C Screening  Completed      Plan:    End of life planning; Advance aging; Advanced directives discussed. Copy of current HCPOA/Living Will requested.    I have personally reviewed and noted the following in the patient's chart:   . Medical and social history . Use of alcohol, tobacco or illicit drugs  . Current medications and supplements . Functional ability and status . Nutritional status . Physical activity . Advanced directives . List of other physicians . Hospitalizations, surgeries, and ER visits in previous 12 months . Vitals . Screenings to include cognitive, depression, and falls . Referrals and appointments  In addition, I have reviewed and discussed with patient certain preventive protocols, quality metrics, and best practice recommendations. A written personalized care plan for preventive services as well as general preventive health recommendations were provided to patient.     Varney Biles, LPN   4/56/2563    Reviewed above information.  Agree with plan.  Dr Nicki Reaper

## 2017-03-10 NOTE — Progress Notes (Signed)
Orders placed for labs

## 2017-03-10 NOTE — Patient Instructions (Addendum)
  Carly [name] , Thank you for taking time to come for your Medicare Wellness Visit. I appreciate your ongoing commitment to your health goals. Please review the following plan we discussed and let me know if I can assist you in the future.   Follow up with Dr. Nicki Reaper as needed.    Bring a copy of your [place] and/or Living Will to be scanned into chart.  Have a great day!  These are the goals we discussed: Goals    . Increase physical activity          As tolerated       This is a list of the screening recommended for you and due dates:  Health Maintenance  Topic Date Due  . Complete foot exam   07/15/1960  . Eye exam for diabetics  07/15/1960  . Urine Protein Check  07/15/1960  . Tetanus Vaccine  07/15/1969  . Colon Cancer Screening  07/15/2000  . Hemoglobin A1C  01/13/2017  . Flu Shot  01/08/2018*  . Pneumonia vaccines (2 of 2 - PPSV23) 12/15/2017  . Mammogram  03/06/2018  . DEXA scan (bone density measurement)  Completed  .  Hepatitis C: One time screening is recommended by Center for Disease Control  (CDC) for  adults born from [birth year] through 1966.   Completed  *Topic was postponed. The date shown is not the original due date.

## 2017-04-21 MED ORDER — ACETAMINOPHEN 325 MG PO TABS
650.0000 mg | ORAL_TABLET | Freq: Once | ORAL | Status: AC
  Administered 2017-04-22: 650 mg via ORAL

## 2017-04-21 MED ORDER — DIPHENHYDRAMINE HCL 50 MG/ML IJ SOLN
25.0000 mg | Freq: Once | INTRAMUSCULAR | Status: AC
  Administered 2017-04-22: 25 mg via INTRAVENOUS

## 2017-04-21 MED ORDER — SODIUM CHLORIDE 0.9 % IV SOLN
600.0000 mg | Freq: Once | INTRAVENOUS | Status: AC
  Administered 2017-04-22: 600 mg via INTRAVENOUS
  Filled 2017-04-21: qty 20

## 2017-04-21 MED ORDER — METHYLPREDNISOLONE SODIUM SUCC 125 MG IJ SOLR
100.0000 mg | Freq: Once | INTRAMUSCULAR | Status: AC
  Administered 2017-04-22: 100 mg via INTRAVENOUS

## 2017-04-22 ENCOUNTER — Inpatient Hospital Stay: Payer: Medicare Other

## 2017-04-22 ENCOUNTER — Inpatient Hospital Stay: Payer: Medicare Other | Attending: Hematology and Oncology | Admitting: Hematology and Oncology

## 2017-04-22 ENCOUNTER — Encounter: Payer: Self-pay | Admitting: Hematology and Oncology

## 2017-04-22 VITALS — BP 122/82 | HR 94 | Temp 97.1°F | Resp 18 | Wt 163.0 lb

## 2017-04-22 VITALS — BP 117/67 | HR 95 | Temp 97.0°F | Resp 20

## 2017-04-22 DIAGNOSIS — R600 Localized edema: Secondary | ICD-10-CM | POA: Diagnosis not present

## 2017-04-22 DIAGNOSIS — Z5112 Encounter for antineoplastic immunotherapy: Secondary | ICD-10-CM | POA: Insufficient documentation

## 2017-04-22 DIAGNOSIS — G35 Multiple sclerosis: Secondary | ICD-10-CM | POA: Diagnosis not present

## 2017-04-22 DIAGNOSIS — Z7982 Long term (current) use of aspirin: Secondary | ICD-10-CM | POA: Diagnosis not present

## 2017-04-22 DIAGNOSIS — Z86010 Personal history of colonic polyps: Secondary | ICD-10-CM | POA: Insufficient documentation

## 2017-04-22 DIAGNOSIS — Z79899 Other long term (current) drug therapy: Secondary | ICD-10-CM | POA: Diagnosis not present

## 2017-04-22 DIAGNOSIS — Z880 Allergy status to penicillin: Secondary | ICD-10-CM | POA: Insufficient documentation

## 2017-04-22 DIAGNOSIS — M419 Scoliosis, unspecified: Secondary | ICD-10-CM | POA: Insufficient documentation

## 2017-04-22 DIAGNOSIS — R634 Abnormal weight loss: Secondary | ICD-10-CM

## 2017-04-22 DIAGNOSIS — Z886 Allergy status to analgesic agent status: Secondary | ICD-10-CM | POA: Diagnosis not present

## 2017-04-22 DIAGNOSIS — F419 Anxiety disorder, unspecified: Secondary | ICD-10-CM | POA: Insufficient documentation

## 2017-04-22 MED ORDER — OCRELIZUMAB 300 MG/10ML IV SOLN: 600.0000 mg | INTRAVENOUS | 0 refills | Status: DC

## 2017-04-22 MED ORDER — SODIUM CHLORIDE 0.9 % IV SOLN
Freq: Once | INTRAVENOUS | Status: AC
  Administered 2017-04-22: 11:00:00 via INTRAVENOUS
  Filled 2017-04-22: qty 1000

## 2017-04-22 NOTE — Progress Notes (Signed)
[facility] day:  04/22/17  Chief Complaint: Carly [name] is a 66 y.o. female with relapsing multiple sclerosis who is seen for 6 month assessment prior to cycle #3 Ocrevus (ocrelizumb).  HPI:  The patient was last seen in the medical oncology clinic on 10/15/2016.  At that time, she denied any complaint.  Exam was stable.  She received cycle #2 Ocrevus.  Premedications included Solumedrol 100 mg and Benadryl 25 mg.  She tolerated her infusion well.  She had a basal cell carcinoma removed from her left upper lip.  During the interim, she has been "ok".  She notes that her symptoms wax and wane.  She tolerated her infusion well.  She felt a little achy.  She has a follow-up appointment with Dr. Effie Shy, her neurologist, in 05/2017.  She was last seen in 01/2017.   Past Medical History:  Diagnosis Date  . Allergy   . Anxiety   . Depression   . Frequent headaches    H/O  . History of chicken pox   . History of colon polyps   . Hx of migraines   . Multiple sclerosis ([place])   . PONV (postoperative nausea and vomiting)     Past Surgical History:  Procedure Laterality Date  . CHOLECYSTECTOMY    . FOOT SURGERY  2015  . GALLBLADDER SURGERY  2008  . HARDWARE REMOVAL Left 02/14/2016   Procedure: LEFT FOOT REMOVAL DEEP IMPLANT;  Surgeon: [name], MD;  Location: [facility];  Service: Orthopedics;  Laterality: Left;  . SPINE SURGERY  2014    Family History  Problem Relation Age of Onset  . Arthritis Mother   . Hypertension Mother   . Macular degeneration Mother   . Hypertension Father   . Hyperlipidemia Father   . Heart disease Maternal Grandfather   . Diabetes Maternal Grandfather   . Kidney disease Paternal Grandmother     Social History:  reports that she has never smoked. She has never used smokeless tobacco. She reports that she does not drink alcohol or use drugs.  She lives in [place].  The patient is  alone today.  Allergies:  Allergies  Allergen Reactions  . Asa [Aspirin] Swelling  . Buspirone Other (See Comments)    Headaches    . Levofloxacin Other (See Comments)    "weakness"  . Lexapro [Escitalopram Oxalate] Other (See Comments)    Weakness   . Motrin [Ibuprofen] Swelling and Other (See Comments)  . Soy Allergy Other (See Comments)  . Ceftin [Cefuroxime Axetil] Rash  . Penicillins Rash  . Sulfa Antibiotics Rash    Current Medications: Current Outpatient Prescriptions  Medication Sig Dispense Refill  . acetaminophen (TYLENOL) 325 MG tablet Take 650 mg by mouth every 4 (four) hours as needed.    . ALPRAZolam (XANAX) 0.5 MG tablet Take by mouth. Reported on 04/09/2016    . B Complex Vitamins (VITAMIN B COMPLEX PO) Take by mouth.    Marland Kitchen CALCIUM PO Take 400 mg by mouth daily.    . Calcium-Phosphorus-Vitamin D (CITRACAL +D3 PO) Take by mouth. 2 tablets twice a day    . Cholecalciferol (D3-1000 PO) Take by mouth.    . Coenzyme Q10 (CO Q10) 100 MG CAPS Take by mouth.    . dalfampridine 10 MG TB12 Take 10 mg by mouth every 12 (twelve) hours.    . fluticasone (FLONASE) 50 MCG/ACT nasal spray     . mirtazapine (  REMERON) 30 MG tablet Take 30 mg by mouth at bedtime.  2  . Multiple Vitamins-Minerals (CENTRUM SILVER ULTRA WOMENS PO) Take by mouth.    . nystatin cream (MYCOSTATIN) Apply 1 application topically 2 (two) times daily. 30 g 0  . ocrelizumab (OCREVUS) 300 MG/10ML injection Inject into the vein every 6 (six) months.    . pseudoephedrine (SUDAFED) 30 MG tablet Take 30 mg by mouth every 6 (six) hours as needed for congestion.    . sertraline (ZOLOFT) 100 MG tablet Take 200 mg by mouth daily.    . sodium chloride (V-R NASAL SPRAY SALINE) 0.65 % nasal spray Place into the nose.    Marland Kitchen tiZANidine (ZANAFLEX) 4 MG capsule Take 1 capsule (4 mg total) by mouth 3 (three) times daily. 21 capsule 0  . vitamin B-12 (CYANOCOBALAMIN) 1000 MCG tablet Take 1,000 mcg by mouth daily.     No  current facility-administered medications for this visit.    Facility-Administered Medications Ordered in Other Visits  Medication Dose Route Frequency Provider Last Rate Last Dose  . 0.9 %  sodium chloride infusion   Intravenous Continuous Lequita Asal, MD 10 mL/hr at 04/25/16 0855    . acetaminophen (TYLENOL) tablet 650 mg  650 mg Oral Once Lequita Asal, MD      . acetaminophen (TYLENOL) tablet 650 mg  650 mg Oral Once Lequita Asal, MD      . diphenhydrAMINE (BENADRYL) injection 25 mg  25 mg Intravenous Once Corcoran, Melissa C, MD      . methylPREDNISolone sodium succinate (SOLU-MEDROL) 125 mg/2 mL injection 100 mg  100 mg Intravenous Once Corcoran, Melissa C, MD      . ocrelizumab (OCREVUS) 600 mg in sodium chloride 0.9 % 500 mL  600 mg Intravenous Once Lequita Asal, MD        Review of Systems:  GENERAL: Feels "ok".  No fevers or sweats.  Weight stable. PERFORMANCE STATUS (ECOG):  2 HEENT:  No visual changes, runny nose, sore throat, mouth sores or tenderness. Lungs: No shortness of breath or cough.  No hemoptysis. Cardiac:  No chest pain, palpitations, orthopnea, or PND. GI:  Constipation.  No nausea, vomiting, diarrhea, melena or hematochezia.  Up-to-date on colonoscopy. GU:  No urgency, frequency, dysuria, or hematuria. Musculoskeletal:  Back problems due to scoliosis.  Neck pain.  No joint pain.  No muscle tenderness. Extremities:  No pain or swelling. Skin:  Basal cell carcinoma removed today.  No rashes or skin changes. Neuro:  General weakness.  Unsteady gait.  No headache, numbness or focal weakness. Endocrine:  No diabetes, thyroid issues, hot flashes or night sweats. Psych:  Anxiety.  No depression. Pain:  No focal pain. Review of systems:  All other systems reviewed and found to be negative.   Physical Exam: Blood pressure 140/85, pulse 88, temperature 98.6 F (37.0 C), temperature source Tympanic, resp. rate 18, height 5\' 3"  (1.6 m), weight  162 lb (73.5 kg). GENERAL:  Well developed, well nourished, woman sitting comfortably in the infusion room in no acute distress. MENTAL STATUS:  Alert and oriented to person, place and time. HEAD:  Short styled brown hair.  Normocephalic, atraumatic, face symmetric, no Cushingoid features. EYES:  Brown eyes.  Pupils equal round and reactive to light and accomodation.  No conjunctivitis or scleral icterus. ENT:  Left upper lip puffy.  Small bandage in place.  Oropharynx clear without lesion.  Patchy tongue. Mucous membranes moist.  RESPIRATORY:  Clear to  auscultation without rales, wheezes or rhonchi. CARDIOVASCULAR:  Regular rate and rhythm without murmur, rub or gallop. ABDOMEN:  Soft, non-tender, with active bowel sounds, and no hepatosplenomegaly.  No masses. SKIN:  No rashes or ulcers.  EXTREMITIES: Trace bilateral ankle edema.  No skin discoloration or tenderness.  No palpable cords. LYMPH NODES: No palpable cervical, supraclavicular, axillary or inguinal adenopathy  NEUROLOGICAL: Alert & oriented, cranial nerves II-XII intact; motor strength symmetric; sensation intact. PSYCH:  Appropriate.   No visits with results within 3 Day(s) from this visit.  Latest known visit with results is:  Appointment on 03/10/2017  Component Date Value Ref Range Status  . TSH 03/10/2017 1.43  0.35 - 4.50 uIU/mL Final  . VITD 03/10/2017 49.41  30.00 - 100.00 ng/mL Final    Assessment:  Carly [name] is a 66 y.o. female with relapsing multiple sclerosis for 4 years.  She received Tysabri monthly (last 02/13/2016).  She has had stability in function.  She began Ocrevus on 04/11/2016 (last 10/15/2016).  Hepatitis testing was negative.  She developed herpes zoster on 03/14/2016.   Symptomatically, she denies any new complaints.  Exam is stable.  Plan: 1.  Discuss interim events.  Discuss plan to continue Octrevus per Dr. Effie Shy.  She will continue Octrevus every 6 months as prescribed by  Dr. Jacqulynn Cadet.  She will receive the same premedications. 2.  Ocrevus 600 mg IV today. 3.  Follow-up as scheduled in 05/2017 with Dr. Jacqulynn Cadet. 4.  RTC in 6 months for MD assessment and Ocrevus.   Lequita Asal, MD  04/22/2017, 10:46 AM

## 2017-04-22 NOTE — Patient Instructions (Signed)
Ocrelizumab injection What is this medicine? OCRELIZUMAB (ok re LIZ ue mab) treats multiple sclerosis. It helps to decrease the number of multiple sclerosis relapses. It is not a cure. This medicine may be used for other purposes; ask your health care provider or pharmacist if you have questions. COMMON BRAND NAME(S): OCREVUS What should I tell my health care provider before I take this medicine? They need to know if you have any of these conditions: -cancer -hepatitis B infection -other infection (especially a virus infection such as chickenpox, cold sores, or herpes) -an unusual or allergic reaction to ocrelizumab, other medicines, foods, dyes or preservatives -pregnant or trying to get pregnant -breast-feeding How should I use this medicine? This medicine is for infusion into a vein. It is given by a health care professional in a hospital or clinic setting. Talk to your pediatrician regarding the use of this medicine in children. Special care may be needed. Overdosage: If you think you have taken too much of this medicine contact a poison control center or emergency room at once. NOTE: This medicine is only for you. Do not share this medicine with others. What if I miss a dose? Keep appointments for follow-up doses as directed. It is important not to miss your dose. Call your doctor or health care professional if you are unable to keep an appointment. What may interact with this medicine? -alemtuzumab -daclizumab -dimethyl fumarate -fingolimod -glatiramer -interferon beta -live virus vaccines -mitoxantrone -natalizumab -peginterferon beta -rituximab -steroid medicines like prednisone or cortisone -teriflunomide This list may not describe all possible interactions. Give your health care provider a list of all the medicines, herbs, non-prescription drugs, or dietary supplements you use. Also tell them if you smoke, drink alcohol, or use illegal drugs. Some items may interact with  your medicine. What should I watch for while using this medicine? Tell your doctor or healthcare professional if your symptoms do not start to get better or if they get worse. This medicine can cause serious allergic reactions. To reduce your risk you may need to take medicine before treatment with this medicine. Take your medicine as directed. Women should inform their doctor if they wish to become pregnant or think they might be pregnant. There is a potential for serious side effects to an unborn child. Talk to your health care professional or pharmacist for more information. Female patients should use effective birth control methods while receiving this medicine and for 6 months after the last dose. Call your doctor or health care professional for advice if you get a fever, chills or sore throat, or other symptoms of a cold or flu. Do not treat yourself. This drug decreases your body's ability to fight infections. Try to avoid being around people who are sick. If you have a hepatitis B infection or a history of a hepatitis B infection, talk to your doctor. The symptoms of hepatitis B may get worse if you take this medicine. In some patients, this medicine may cause a serious brain infection that may cause death. If you have any problems seeing, thinking, speaking, walking, or standing, tell your doctor right away. If you cannot reach your doctor, urgently seek other source of medical care. This medicine can decrease the response to a vaccine. If you need to get vaccinated, tell your healthcare professional if you have received this medicine. Extra booster doses may be needed. Talk to your doctor to see if a different vaccination schedule is needed. Talk to your doctor about your risk of cancer.   You may be more at risk for certain types of cancers if you take this medicine. What side effects may I notice from receiving this medicine? Side effects that you should report to your doctor or health care  professional as soon as possible: -allergic reactions like skin rash, itching or hives, swelling of the face, lips, or tongue -breathing problems -facial flushing -fast, irregular heartbeat -lump or soreness in the breast -signs and symptoms of herpes such as cold sore, shingles, or genital sores -signs and symptoms of infection like fever or chills, cough, sore throat, pain or trouble passing urine -signs and symptoms of low blood pressure like dizziness; feeling faint or lightheaded, falls; unusually weak or tired -signs and symptoms of progressive multifocal leukoencephalopathy (PML) like changes in vision; clumsiness; confusion; personality changes; weakness on one side of the body -swelling of the ankles, feet, hands Side effects that usually do not require medical attention (report these to your doctor or health care professional if they continue or are bothersome): -back pain -depressed mood -diarrhea -pain, redness, or irritation at site where injected This list may not describe all possible side effects. Call your doctor for medical advice about side effects. You may report side effects to FDA at 1-800-FDA-1088. Where should I keep my medicine? This drug is given in a hospital or clinic and will not be stored at home. NOTE: This sheet is a summary. It may not cover all possible information. If you have questions about this medicine, talk to your doctor, pharmacist, or health care provider.  2018 Elsevier/Gold Standard (2016-01-15 09:40:25)  

## 2017-04-22 NOTE — Progress Notes (Signed)
[facility] day:  04/22/17  Chief Complaint: Carly [name] is a 66 y.o. female with relapsing multiple sclerosis who is seen for 6 month assessment prior to cycle #3 Ocrevus (ocrelizumb).  HPI:  The patient was last seen in the medical oncology clinic on 10/15/2016.  At that time, she denied any complaint.  Exam was stable.  She received cycle #2 Ocrevus.  Premedications included Solumedrol 100 mg and Benadryl 25 mg.  She tolerated her infusion well.  During the interim,   She has a follow-up appointment with Dr. Effie Shy, her neurologist, on 11/07/2016.   Past Medical History:  Diagnosis Date  . Allergy   . Anxiety   . Depression   . Frequent headaches    H/O  . History of chicken pox   . History of colon polyps   . Hx of migraines   . Multiple sclerosis ([place])   . PONV (postoperative nausea and vomiting)     Past Surgical History:  Procedure Laterality Date  . CHOLECYSTECTOMY    . FOOT SURGERY  2015  . GALLBLADDER SURGERY  2008  . HARDWARE REMOVAL Left 02/14/2016   Procedure: LEFT FOOT REMOVAL DEEP IMPLANT;  Surgeon: [name], MD;  Location: [facility];  Service: Orthopedics;  Laterality: Left;  . SPINE SURGERY  2014    Family History  Problem Relation Age of Onset  . Arthritis Mother   . Hypertension Mother   . Macular degeneration Mother   . Hypertension Father   . Hyperlipidemia Father   . Heart disease Maternal Grandfather   . Diabetes Maternal Grandfather   . Kidney disease Paternal Grandmother     Social History:  reports that she has never smoked. She has never used smokeless tobacco. She reports that she does not drink alcohol or use drugs.  She lives in [place].  The patient is alone today.  Allergies:  Allergies  Allergen Reactions  . Asa [Aspirin] Swelling  . Buspirone Other (See Comments)    Headaches    . Levofloxacin Other (See Comments)    "weakness"  . Lexapro  [Escitalopram Oxalate] Other (See Comments)    Weakness   . Motrin [Ibuprofen] Swelling and Other (See Comments)  . Soy Allergy Other (See Comments)  . Ceftin [Cefuroxime Axetil] Rash  . Penicillins Rash  . Sulfa Antibiotics Rash    Current Medications: Current Outpatient Prescriptions  Medication Sig Dispense Refill  . acetaminophen (TYLENOL) 325 MG tablet Take 650 mg by mouth every 4 (four) hours as needed.    . ALPRAZolam (XANAX) 0.5 MG tablet Take by mouth. Reported on 04/09/2016    . B Complex Vitamins (VITAMIN B COMPLEX PO) Take by mouth.    Marland Kitchen CALCIUM PO Take 400 mg by mouth daily.    . Calcium-Phosphorus-Vitamin D (CITRACAL +D3 PO) Take by mouth. 2 tablets twice a day    . Cholecalciferol (D3-1000 PO) Take by mouth.    . Coenzyme Q10 (CO Q10) 100 MG CAPS Take by mouth.    . dalfampridine 10 MG TB12 Take 10 mg by mouth every 12 (twelve) hours.    . fluticasone (FLONASE) 50 MCG/ACT nasal spray     . mirtazapine (REMERON) 15 MG tablet Take 15 mg by mouth at bedtime.    . Multiple Vitamins-Minerals (CENTRUM SILVER ULTRA WOMENS PO) Take by mouth.    . nystatin cream (MYCOSTATIN) Apply 1 application topically 2 (two) times daily. (Patient not taking: Reported  on 03/10/2017) 30 g 0  . ocrelizumab (OCREVUS) 300 MG/10ML injection Inject into the vein every 6 (six) months.    . pseudoephedrine (SUDAFED) 30 MG tablet Take 30 mg by mouth every 6 (six) hours as needed for congestion.    . sertraline (ZOLOFT) 100 MG tablet Take 200 mg by mouth daily.    . sodium chloride (V-R NASAL SPRAY SALINE) 0.65 % nasal spray Place into the nose.    Marland Kitchen tiZANidine (ZANAFLEX) 4 MG capsule Take 1 capsule (4 mg total) by mouth 3 (three) times daily. 21 capsule 0  . vitamin B-12 (CYANOCOBALAMIN) 1000 MCG tablet Take 1,000 mcg by mouth daily.     No current facility-administered medications for this visit.    Facility-Administered Medications Ordered in Other Visits  Medication Dose Route Frequency Provider  Last Rate Last Dose  . 0.9 %  sodium chloride infusion   Intravenous Continuous Lequita Asal, MD 10 mL/hr at 04/25/16 0855    . acetaminophen (TYLENOL) tablet 650 mg  650 mg Oral Once Lequita Asal, MD      . acetaminophen (TYLENOL) tablet 650 mg  650 mg Oral Once Lequita Asal, MD      . diphenhydrAMINE (BENADRYL) injection 25 mg  25 mg Intravenous Once Corcoran, Melissa C, MD      . methylPREDNISolone sodium succinate (SOLU-MEDROL) 125 mg/2 mL injection 100 mg  100 mg Intravenous Once Corcoran, Melissa C, MD      . ocrelizumab (OCREVUS) 600 mg in sodium chloride 0.9 % 500 mL  600 mg Intravenous Once Lequita Asal, MD        Review of Systems:  GENERAL:  More energy.  No fevers or sweats.  Weight loss 6 pounds. PERFORMANCE STATUS (ECOG):  2 HEENT:  No visual changes, runny nose, sore throat, mouth sores or tenderness. Lungs: No shortness of breath or cough.  No hemoptysis. Cardiac:  No chest pain, palpitations, orthopnea, or PND. GI:  Constipation.  No nausea, vomiting, diarrhea, melena or hematochezia.  Up-to-date on colonoscopy. GU:  No urgency, frequency, dysuria, or hematuria. Musculoskeletal:  Back problems due to scoliosis.  Neck pain.  No joint pain.  No muscle tenderness. Extremities:  No pain or swelling. Skin:  No rashes or skin changes. Neuro:  General weakness.  Unsteady gait.  No headache, numbness or focal weakness. Endocrine:  No diabetes, thyroid issues, hot flashes or night sweats. Psych:  Anxiety.  No depression. Pain:  No focal pain. Review of systems:  All other systems reviewed and found to be negative.   Physical Exam: Blood pressure 140/85, pulse 88, temperature 98.6 F (37.0 C), temperature source Tympanic, resp. rate 18, height 5\' 3"  (1.6 m), weight 162 lb (73.5 kg). GENERAL:  Well developed, well nourished, sitting comfortably in the infusion room in no acute distress. MENTAL STATUS:  Alert and oriented to person, place and  time. HEAD:  Short styled brown hair.  Normocephalic, atraumatic, face symmetric, no Cushingoid features. EYES:  Brown eyes.  Pupils equal round and reactive to light and accomodation.  No conjunctivitis or scleral icterus. ENT:  Oropharynx clear without lesion.  Patchy tongue. Mucous membranes moist.  RESPIRATORY:  Clear to auscultation without rales, wheezes or rhonchi. CARDIOVASCULAR:  Regular rate and rhythm without murmur, rub or gallop. ABDOMEN:  Soft, non-tender, with active bowel sounds, and no hepatosplenomegaly.  No masses. SKIN:  No rashes or ulcers.  EXTREMITIES: Trace bilateral ankle edema.  No skin discoloration or tenderness.  No palpable  cords. LYMPH NODES: No palpable cervical, supraclavicular, axillary or inguinal adenopathy  NEUROLOGICAL: Alert & oriented, cranial nerves II-XII intact; motor strength symmetric; sensation intact. PSYCH:  Appropriate.  No visits with results within 3 Day(s) from this visit.  Latest known visit with results is:  Appointment on 03/10/2017  Component Date Value Ref Range Status  . TSH 03/10/2017 1.43  0.35 - 4.50 uIU/mL Final  . VITD 03/10/2017 49.41  30.00 - 100.00 ng/mL Final    Assessment:  HALIEY ROMBERG is a 66 y.o. female with relapsing multiple sclerosis for 4 years.  She received Tysabri monthly (last 02/13/2016).  She has had stability in function.  She began Ocrevus on 04/11/2016 (last 10/15/2016).  Hepatitis testing was negative.  She developed herpes zoster on 03/14/2016.   Symptomatically, she denies any new complaints.  Exam is stable.  Plan: 1.  Discuss interim events.  Discuss plan to continue Octrevus per Dr. Effie Shy.  She will receive Octrevus every 6 months.  She will receive the same premedications. 2.  Ocrevus 600 mg IV today. 3.  Follow-up as scheduled with Dr. Jacqulynn Cadet. 4.  RTC in 6 months for MD assessment and Ocrevus.   Lequita Asal, MD  04/22/2017, 3:07 AM

## 2017-04-22 NOTE — Progress Notes (Signed)
Patient offers no complaints today. 

## 2017-05-05 ENCOUNTER — Ambulatory Visit
Admission: RE | Admit: 2017-05-05 | Discharge: 2017-05-05 | Disposition: A | Discharge status: Home / Self Care | Payer: Medicare Other | Source: Ambulatory Visit | Attending: Internal Medicine | Admitting: Internal Medicine

## 2017-05-05 DIAGNOSIS — Z1231 Encounter for screening mammogram for malignant neoplasm of breast: Secondary | ICD-10-CM | POA: Insufficient documentation

## 2017-05-15 ENCOUNTER — Encounter: Payer: Self-pay | Admitting: *Deleted

## 2017-05-18 ENCOUNTER — Ambulatory Visit: Payer: Medicare Other | Admitting: Certified Registered"

## 2017-05-18 ENCOUNTER — Encounter
Admission: RE | Disposition: A | Discharge status: Home / Self Care | Payer: Self-pay | Source: Ambulatory Visit | Attending: Unknown Physician Specialty

## 2017-05-18 ENCOUNTER — Ambulatory Visit
Admission: RE | Admit: 2017-05-18 | Discharge: 2017-05-18 | Disposition: A | Discharge status: Home / Self Care | Payer: Medicare Other | Source: Ambulatory Visit | Attending: Unknown Physician Specialty | Admitting: Unknown Physician Specialty

## 2017-05-18 DIAGNOSIS — F329 Major depressive disorder, single episode, unspecified: Secondary | ICD-10-CM | POA: Diagnosis not present

## 2017-05-18 DIAGNOSIS — G35 Multiple sclerosis: Secondary | ICD-10-CM | POA: Insufficient documentation

## 2017-05-18 DIAGNOSIS — K640 First degree hemorrhoids: Secondary | ICD-10-CM | POA: Diagnosis not present

## 2017-05-18 DIAGNOSIS — K5730 Diverticulosis of large intestine without perforation or abscess without bleeding: Secondary | ICD-10-CM | POA: Insufficient documentation

## 2017-05-18 DIAGNOSIS — K219 Gastro-esophageal reflux disease without esophagitis: Secondary | ICD-10-CM | POA: Diagnosis not present

## 2017-05-18 DIAGNOSIS — D124 Benign neoplasm of descending colon: Secondary | ICD-10-CM | POA: Insufficient documentation

## 2017-05-18 DIAGNOSIS — D123 Benign neoplasm of transverse colon: Secondary | ICD-10-CM | POA: Insufficient documentation

## 2017-05-18 DIAGNOSIS — Z79899 Other long term (current) drug therapy: Secondary | ICD-10-CM | POA: Diagnosis not present

## 2017-05-18 DIAGNOSIS — F419 Anxiety disorder, unspecified: Secondary | ICD-10-CM | POA: Insufficient documentation

## 2017-05-18 DIAGNOSIS — Z1211 Encounter for screening for malignant neoplasm of colon: Secondary | ICD-10-CM | POA: Insufficient documentation

## 2017-05-18 DIAGNOSIS — D120 Benign neoplasm of cecum: Secondary | ICD-10-CM | POA: Diagnosis not present

## 2017-05-18 DIAGNOSIS — Z86010 Personal history of colonic polyps: Secondary | ICD-10-CM | POA: Insufficient documentation

## 2017-05-18 DIAGNOSIS — D122 Benign neoplasm of ascending colon: Secondary | ICD-10-CM | POA: Diagnosis not present

## 2017-05-18 DIAGNOSIS — Z7982 Long term (current) use of aspirin: Secondary | ICD-10-CM | POA: Insufficient documentation

## 2017-05-18 HISTORY — DX: Pneumonitis due to inhalation of food and vomit: J69.0

## 2017-05-18 HISTORY — PX: COLONOSCOPY WITH PROPOFOL: SHX5780

## 2017-05-18 HISTORY — DX: Gastro-esophageal reflux disease without esophagitis: K21.9

## 2017-05-18 LAB — HM COLONOSCOPY

## 2017-05-18 SURGERY — COLONOSCOPY WITH PROPOFOL
Anesthesia: General

## 2017-05-18 MED ORDER — PROPOFOL 500 MG/50ML IV EMUL
INTRAVENOUS | Status: DC | PRN
  Administered 2017-05-18: 125 ug/kg/min via INTRAVENOUS

## 2017-05-18 MED ORDER — ONDANSETRON HCL 4 MG/2ML IJ SOLN
INTRAMUSCULAR | Status: DC | PRN
  Administered 2017-05-18: 4 mg via INTRAVENOUS

## 2017-05-18 MED ORDER — PROPOFOL 500 MG/50ML IV EMUL
INTRAVENOUS | Status: AC
  Filled 2017-05-18: qty 50

## 2017-05-18 MED ORDER — SODIUM CHLORIDE 0.9 % IV SOLN: INTRAVENOUS | Status: DC

## 2017-05-18 MED ORDER — SODIUM CHLORIDE 0.9 % IV SOLN
INTRAVENOUS | Status: DC
  Administered 2017-05-18: 10:00:00 via INTRAVENOUS

## 2017-05-18 MED ORDER — LIDOCAINE HCL (CARDIAC) 20 MG/ML IV SOLN
INTRAVENOUS | Status: DC | PRN
  Administered 2017-05-18: 50 mg via INTRAVENOUS

## 2017-05-18 MED ORDER — PROPOFOL 10 MG/ML IV BOLUS
INTRAVENOUS | Status: DC | PRN
  Administered 2017-05-18: 50 mg via INTRAVENOUS

## 2017-05-18 NOTE — Anesthesia Procedure Notes (Signed)
Performed by: Peniel Hass Pre-anesthesia Checklist: Patient identified, Emergency Drugs available, Suction available, Patient being monitored and Timeout performed Patient Re-evaluated:Patient Re-evaluated prior to induction Oxygen Delivery Method: Nasal cannula Preoxygenation: Pre-oxygenation with 100% oxygen Induction Type: IV induction       

## 2017-05-18 NOTE — H&P (Signed)
Primary Care Physician:  Einar Pheasant, MD Primary Gastroenterologist:  Dr. Vira Agar  Pre-Procedure History & Physical: HPI:  Carly [name] is a 66 y.o. female is here for an colonoscopy.   Past Medical History:  Diagnosis Date  . Allergy   . Anxiety   . Aspiration pneumonia ([place])   . Depression   . Frequent headaches    H/O  . GERD (gastroesophageal reflux disease)   . History of chicken pox   . History of colon polyps   . Hx of migraines   . Multiple sclerosis ([place])   . PONV (postoperative nausea and vomiting)     Past Surgical History:  Procedure Laterality Date  . BACK SURGERY    . CHOLECYSTECTOMY    . FOOT SURGERY  2015  . GALLBLADDER SURGERY  2008  . HARDWARE REMOVAL Left 02/14/2016   Procedure: LEFT FOOT REMOVAL DEEP IMPLANT;  Surgeon: [name], MD;  Location: [facility];  Service: Orthopedics;  Laterality: Left;  . HERNIA REPAIR     Inguinal Hernia Repair  . SPINE SURGERY  2014    Prior to Admission medications   Medication Sig Start Date End Date Taking? Authorizing Provider  acetaminophen (TYLENOL) 325 MG tablet Take 650 mg by mouth every 4 (four) hours as needed.   Yes [provider]  ALPRAZolam Duanne Moron) 0.5 MG tablet Take by mouth. Reported on 04/09/2016   Yes [provider]  dalfampridine 10 MG TB12 Take 10 mg by mouth every 12 (twelve) hours.   Yes [provider]  fluticasone Asencion Islam) 50 MCG/ACT nasal spray  09/07/16  Yes [provider]  mirtazapine (REMERON) 30 MG tablet Take 35 mg by mouth at bedtime.  04/16/17  Yes [provider]  ocrelizumab (OCREVUS) 300 MG/10ML injection Inject 20 mLs (600 mg total) into the vein every 6 (six) months. 04/22/17  Yes Corcoran, Melissa C, MD  sertraline (ZOLOFT) 100 MG tablet Take 200 mg by mouth at bedtime.    Yes [provider]  sodium chloride (V-R NASAL SPRAY SALINE) 0.65 % nasal spray Place into the nose.   Yes [provider]   B Complex Vitamins (VITAMIN B COMPLEX PO) Take by mouth.    [provider]  Calcium-Phosphorus-Vitamin D (CITRACAL +D3 PO) Take by mouth. 2 tablets twice a day    [provider]  Cholecalciferol (D3-1000 PO) Take by mouth.    [provider]  Coenzyme Q10 (CO Q10) 100 MG CAPS Take by mouth.    [provider]  Multiple Vitamins-Minerals (CENTRUM SILVER ULTRA WOMENS PO) Take by mouth.    [provider]    Allergies as of 03/25/2017 - Review Complete 03/10/2017  Allergen Reaction Noted  . Asa [aspirin] Swelling 11/14/2014  . Buspirone Other (See Comments) 11/14/2014  . Levofloxacin Other (See Comments) 11/14/2014  . Lexapro [escitalopram oxalate] Other (See Comments) 02/14/2016  . Motrin [ibuprofen] Swelling and Other (See Comments) 01/25/2015  . Soy allergy Other (See Comments) 06/25/2015  . Ceftin [cefuroxime axetil] Rash 01/25/2015  . Penicillins Rash 11/14/2014  . Sulfa antibiotics Rash 11/14/2014    Family History  Problem Relation Age of Onset  . Arthritis Mother   . Hypertension Mother   . Macular degeneration Mother   . Hypertension Father   . Hyperlipidemia Father   . Heart disease Maternal Grandfather   . Diabetes Maternal Grandfather   . Kidney disease Paternal Grandmother     Social History   Social History  .  Marital status: Married    Spouse name: N/A  . Number of children: N/A  . Years of education: N/A   Occupational History  . Not on file.   Social History Main Topics  . Smoking status: Never Smoker  . Smokeless tobacco: Never Used  . Alcohol use No  . Drug use: No  . Sexual activity: Not Currently   Other Topics Concern  . Not on file   Social History Narrative  . No narrative on file    Review of Systems: See HPI, otherwise negative ROS  Physical Exam: BP 134/82   Pulse 84   Temp 99 F (37.2 C) (Tympanic)   Resp 16   Ht 5\' 3"  (1.6 m)   Wt 68.9 kg (152 lb)   SpO2 97%   BMI 26.93  kg/m  General:   Alert,  pleasant and cooperative in NAD Head:  Normocephalic and atraumatic. Neck:  Supple; no masses or thyromegaly. Lungs:  Clear throughout to auscultation.    Heart:  Regular rate and rhythm. Abdomen:  Soft, nontender and nondistended. Normal bowel sounds, without guarding, and without rebound.   Neurologic:  Alert and  oriented x4;  grossly normal neurologically.  Impression/Plan: Carly [name] is here for an colonoscopy to be performed for [facility] colon polyps.  Risks, benefits, limitations, and alternatives regarding  colonoscopy have been reviewed with the patient.  Questions have been answered.  All parties agreeable.   Gaylyn Cheers, MD  05/18/2017, 10:35 AM

## 2017-05-18 NOTE — Op Note (Signed)
[facility] Gastroenterology Patient Name: Carly [name] Procedure Date: 05/18/2017 10:15 AM MRN: [id number] Account #: [id number] Date of Birth: [birth year] Admit Type: Outpatient Age: 66 Room: [facility] ENDO ROOM 3 Gender: Female Note Status: Finalized Procedure:            Colonoscopy Indications:          High risk colon cancer surveillance: Personal history                        of colonic polyps Providers:            [name], MD Referring MD:         Einar Pheasant, MD (Referring MD) Medicines:            Propofol per Anesthesia Complications:        No immediate complications. Procedure:            Pre-Anesthesia Assessment:                       - After reviewing the risks and benefits, the patient                        was deemed in satisfactory condition to undergo the                        procedure.                       After obtaining informed consent, the colonoscope was                        passed under direct vision. Throughout the procedure,                        the patient's blood pressure, pulse, and oxygen                        saturations were monitored continuously. The                        Colonoscope was introduced through the anus and                        advanced to the the cecum, identified by appendiceal                        orifice and ileocecal valve. The colonoscopy was                        somewhat difficult due to significant looping and a                        tortuous colon. The patient tolerated the procedure                        well. The quality of the bowel preparation was good. Findings:      A 6 mm polyp was found in the proximal descending colon. The polyp was       sessile. The polyp was removed with a hot snare. Resection and retrieval       were complete.  A small polyp was found in the cecum. The polyp was sessile. The polyp       was removed with a hot snare. Resection and retrieval  were complete.      A diminutive polyp was found in the cecum. The polyp was sessile. The       polyp was removed with a cold biopsy forceps. Resection and retrieval       were complete.      A diminutive polyp was found in the ascending colon. The polyp was       sessile. The polyp was removed with a jumbo cold forceps. Resection and       retrieval were complete.      A diminutive polyp was found in the descending colon. The polyp was       sessile. The polyp was removed with a jumbo cold forceps. Resection and       retrieval were complete.      Internal hemorrhoids were found during endoscopy. The hemorrhoids were       small and Grade I (internal hemorrhoids that do not prolapse).      Multiple small-mouthed diverticula were found in the sigmoid colon. Impression:           - One 6 mm polyp in the proximal descending colon,                        removed with a hot snare. Resected and retrieved.                       - One small polyp in the cecum, removed with a hot                        snare. Resected and retrieved.                       - One diminutive polyp in the cecum, removed with a                        cold biopsy forceps. Resected and retrieved.                       - One diminutive polyp in the ascending colon, removed                        with a jumbo cold forceps. Resected and retrieved.                       - One diminutive polyp in the descending colon, removed                        with a jumbo cold forceps. Resected and retrieved.                       - Internal hemorrhoids.                       - Diverticulosis in the sigmoid colon. Recommendation:       - Await pathology results. [name], MD 05/18/2017 11:21:22 AM This report has been signed electronically. Number of Addenda: 0 Note Initiated On: 05/18/2017 10:15 AM Scope Withdrawal Time: 0 hours 10 minutes 23 seconds  Total Procedure Duration: 0 hours 33 minutes 18 seconds       [facility]

## 2017-05-18 NOTE — Anesthesia Post-op Follow-up Note (Cosign Needed)
Anesthesia QCDR form completed.        

## 2017-05-18 NOTE — Anesthesia Preprocedure Evaluation (Signed)
Anesthesia Evaluation  Patient identified by MRN, date of birth, ID band Patient awake    Reviewed: Allergy & Precautions, H&P , NPO status , Patient's Chart, lab work & pertinent test results  History of Anesthesia Complications (+) PONV and history of anesthetic complications  Airway Mallampati: III  TM Distance: <3 FB Neck ROM: limited    Dental  (+) Poor Dentition, Chipped, Caps   Pulmonary neg shortness of breath, pneumonia,           Cardiovascular Exercise Tolerance: Good hypertension, (-) angina(-) Past MI and (-) DOE      Neuro/Psych  Headaches, PSYCHIATRIC DISORDERS Anxiety Depression    GI/Hepatic Neg liver ROS, GERD  Controlled and Medicated,  Endo/Other  negative endocrine ROS  Renal/GU negative Renal ROS  negative genitourinary   Musculoskeletal   Abdominal   Peds  Hematology negative hematology ROS (+)   Anesthesia Other Findings Past Medical History: No date: Allergy No date: Anxiety No date: Aspiration pneumonia ([facility]) No date: Depression No date: Frequent headaches     Comment:  H/O No date: GERD (gastroesophageal reflux disease) No date: History of chicken pox No date: History of colon polyps No date: Hx of migraines No date: Multiple sclerosis ([place]) No date: PONV (postoperative nausea and vomiting)  Past Surgical History: No date: BACK SURGERY No date: CHOLECYSTECTOMY 2015: FOOT SURGERY 2008: GALLBLADDER SURGERY 02/14/2016: HARDWARE REMOVAL; Left     Comment:  Procedure: LEFT FOOT REMOVAL DEEP IMPLANT;  Surgeon:               [name], MD;  Location: [place];                Service: Orthopedics;  Laterality: Left; No date: HERNIA REPAIR     Comment:  Inguinal Hernia Repair 2014: SPINE SURGERY  BMI    Body Mass Index:  26.93 kg/m      Reproductive/Obstetrics negative OB ROS                             Anesthesia Physical Anesthesia  Plan  ASA: III  Anesthesia Plan: General   Post-op Pain Management:    Induction: Intravenous  PONV Risk Score and Plan:   Airway Management Planned: Natural Airway and Nasal Cannula  Additional Equipment:   Intra-op Plan:   Post-operative Plan:   Informed Consent: I have reviewed the patients History and Physical, chart, labs and discussed the procedure including the risks, benefits and alternatives for the proposed anesthesia with the patient or authorized representative who has indicated his/her understanding and acceptance.   Dental Advisory Given  Plan Discussed with: Anesthesiologist, CRNA and Surgeon  Anesthesia Plan Comments: (Patient has a history of aspiration with previous orthopedic procedure in which she was not intubated.  Upon reading the record I am not sure if this was a true aspiration or just a laryngospasm vs bronchospasm.  She reports many previous anesthetics without any problems.  She is NPO appropriate, reports no problems with symptomatic GERD at this time and no dysphagia symptoms.  At this time I feel that she is safe to receive an anesthetic for this procedure from our team without intubation.  She was consented that with her history of aspiration that she may have an increased risk for aspiration during this procedure, however, I am not sure how much her risk is increased and I feel that it is still very low.  This was explained  to patient and husband who voiced understanding.   Patient consented for risks of anesthesia including but not limited to:  - adverse reactions to medications - risk of intubation if required - damage to teeth, lips or other oral mucosa - sore throat or hoarseness - Damage to heart, brain, lungs or loss of life  Patient voiced understanding.)        Anesthesia Quick Evaluation

## 2017-05-18 NOTE — Transfer of Care (Signed)
Immediate Anesthesia Transfer of Care Note  Patient: Carly [name]  Procedure(s) Performed: Procedure(s): COLONOSCOPY WITH PROPOFOL (N/A)  Patient Location: PACU  Anesthesia Type:General  Level of Consciousness: awake and responds to stimulation  Airway & Oxygen Therapy: Patient Spontanous Breathing and Patient connected to nasal cannula oxygen  Post-op Assessment: Report given to RN and Post -op Vital signs reviewed and stable  Post vital signs: Reviewed and stable  Last Vitals:  Vitals:   05/18/17 0950 05/18/17 1126  BP: 134/82 (!) 138/99  Pulse: 84 82  Resp: 16 10  Temp: 37.2 C     Last Pain:  Vitals:   05/18/17 0950  TempSrc: Tympanic         Complications: No apparent anesthesia complications

## 2017-05-18 NOTE — Anesthesia Postprocedure Evaluation (Signed)
Anesthesia Post Note  Patient: Carly [name]  Procedure(s) Performed: Procedure(s) (LRB): COLONOSCOPY WITH PROPOFOL (N/A)  Patient location during evaluation: Endoscopy Anesthesia Type: General Level of consciousness: awake and alert Pain management: pain level controlled Vital Signs Assessment: post-procedure vital signs reviewed and stable Respiratory status: spontaneous breathing, nonlabored ventilation, respiratory function stable and patient connected to nasal cannula oxygen Cardiovascular status: blood pressure returned to baseline and stable Postop Assessment: no signs of nausea or vomiting Comments: Patient had 1 episode of small volume vomitus during case.  Patient was in lateral position and kept in lateral position during the episode.  Mouth immediately suctioned.  Patient maintained airway reflexes during this time and coughed after episode.  Lungs clear afterwards and patient maintained her 02 saturation.  I Spoke with patient and husband in GI PACU and I explained to them what happened. Patient now saturating 100 % on RA.  Lungs clear to ascultation.  Patient has no shortness of breath.  The patient is not interested in spending the night in the hospital.  I feel that patient should be safe for discharge home at this time. Patient and husband instructed that if she has any new symptoms such as shortness of breath or any other concerns that she should contact a doctor immediately.  They voiced understanding.     Last Vitals:  Vitals:   05/18/17 1150 05/18/17 1200  BP: 138/80 (!) 109/94  Pulse:    Resp:    Temp:      Last Pain:  Vitals:   05/18/17 0950  TempSrc: Tympanic                 Precious Haws Caliope Ruppert

## 2017-05-19 ENCOUNTER — Encounter: Payer: Self-pay | Admitting: Unknown Physician Specialty

## 2017-05-19 LAB — SURGICAL PATHOLOGY

## 2017-06-23 ENCOUNTER — Telehealth: Payer: Self-pay | Admitting: Internal Medicine

## 2017-06-23 DIAGNOSIS — Z23 Encounter for immunization: Secondary | ICD-10-CM

## 2017-06-23 MED ORDER — ZOSTER VAC RECOMB ADJUVANTED 50 MCG/0.5ML IM SUSR: 0.5000 mL | Freq: Once | INTRAMUSCULAR | 0 refills | Status: AC

## 2017-06-23 NOTE — Telephone Encounter (Signed)
Please confirm with pt that she has had chicken pox.  Also, let her know that she does not need rx.  May save her a trip over here to pick up rx.

## 2017-06-23 NOTE — Telephone Encounter (Signed)
Pt called and stated that she needs an rx for the shingles vaccine so that she can take it to the pharmacy. Please advise, thank you!  Call pt @ [phone]

## 2017-06-23 NOTE — Telephone Encounter (Signed)
Script printed in your blue folder

## 2017-06-23 NOTE — Telephone Encounter (Signed)
Pt confirmed she had chicken pox. Informed she does not need rx.

## 2017-10-07 ENCOUNTER — Ambulatory Visit: Payer: Medicare Other | Admitting: Hematology and Oncology

## 2017-10-07 ENCOUNTER — Ambulatory Visit: Payer: Medicare Other

## 2017-10-13 NOTE — Progress Notes (Addendum)
[facility] day:  10/13/17  Chief Complaint: Carly [name] is a 67 y.o. female with relapsing multiple sclerosis who is seen for 6 month assessment prior to cycle #3 Ocrevus (ocrelizumb).  HPI:  The patient was last seen in the medical oncology clinic on 04/22/2017.  At that time, she denied any complaint.  Exam was stable.  She received cycle #2 Ocrevus.  Premedications included Solumedrol 100 mg and Benadryl 25 mg.  She tolerated her infusion well.  Patient had routine colonoscopy on 05/18/2017 done by Dr. Vira Agar.  A total of 5 polyps were resected during the procedure. Pathology suggested tubular adenomas with no evidence of high-grade dysplasia or malignancy. Colonoscopy demonstrated internal hemorrhoids,diverticulosis within the sigmoid colon.  She was seen in the ED at [facility] on 09/16/2017 for complaints of intermittent dizziness, frequent urination nausea, and lower back pain. At that time patient was tapering off Sertraline, and felt that her symptoms were secondary to withdrawal. EKG demonstrated NSR at a rate of 82 with no evidence of ST segment elevation or depression. Normal labs included CBC with diff, troponin, and urinalysis. BMP was also normal with the exception of an elevated calcium, which was 10.4.  Patient was treated with 1L NS IV before ultimately being released home to follow up with her primary care physician.  She was seen in the ED at [facility] ([place] on 10/01/2017 for lower back pain and rash. Patient was concerned about having the shingles, however this was ruled out by the ED provider. Patient was discharged home to follow up with her primary care physician.  During the interim, she has felt weaker.  She describes a lack of energy.  She tapered off of Zoloft.  She is trying to get off of antidepressants.  She feels she has had withdrawlas secondary to this change in her medications.    She saw her neurologist, Dr Effie Shy, on  09/14/2017.  She states that "everything was fine".   Past Medical History:  Diagnosis Date  . Allergy   . Anxiety   . Aspiration pneumonia ([place])   . Depression   . Frequent headaches    H/O  . GERD (gastroesophageal reflux disease)   . History of chicken pox   . History of colon polyps   . Hx of migraines   . Multiple sclerosis ([place])   . PONV (postoperative nausea and vomiting)     Past Surgical History:  Procedure Laterality Date  . BACK SURGERY    . CHOLECYSTECTOMY    . COLONOSCOPY WITH PROPOFOL N/A 05/18/2017   Procedure: COLONOSCOPY WITH PROPOFOL;  Surgeon: [name], MD;  Location: [facility] ENDOSCOPY;  Service: Endoscopy;  Laterality: N/A;  . FOOT SURGERY  2015  . GALLBLADDER SURGERY  2008  . HARDWARE REMOVAL Left 02/14/2016   Procedure: LEFT FOOT REMOVAL DEEP IMPLANT;  Surgeon: [name], MD;  Location: [place];  Service: Orthopedics;  Laterality: Left;  . HERNIA REPAIR     Inguinal Hernia Repair  . SPINE SURGERY  2014    Family History  Problem Relation Age of Onset  . Arthritis Mother   . Hypertension Mother   . Macular degeneration Mother   . Hypertension Father   . Hyperlipidemia Father   . Heart disease Maternal Grandfather   . Diabetes Maternal Grandfather   . Kidney disease Paternal Grandmother     Social History:  reports that  has never smoked. she has never used smokeless  tobacco. She reports that she does not drink alcohol or use drugs.  She lives in [place].  The patient is acompanied by her husband today.  Allergies:  Allergies  Allergen Reactions  . Asa [Aspirin] Swelling  . Buspirone Other (See Comments)    Headaches    . Levofloxacin Other (See Comments)    "weakness"  . Lexapro [Escitalopram Oxalate] Other (See Comments)    Weakness   . Motrin [Ibuprofen] Swelling and Other (See Comments)  . Soy Allergy Other (See Comments)  . Ceftin [Cefuroxime Axetil] Rash  . Penicillins Rash  . Sulfa Antibiotics Rash     Current Medications: Current Outpatient Medications  Medication Sig Dispense Refill  . acetaminophen (TYLENOL) 325 MG tablet Take 650 mg by mouth every 4 (four) hours as needed.    . ALPRAZolam (XANAX) 0.5 MG tablet Take by mouth. Reported on 04/09/2016    . B Complex Vitamins (VITAMIN B COMPLEX PO) Take by mouth.    . Calcium-Phosphorus-Vitamin D (CITRACAL +D3 PO) Take by mouth. 2 tablets twice a day    . Cholecalciferol (D3-1000 PO) Take by mouth.    . Coenzyme Q10 (CO Q10) 100 MG CAPS Take by mouth.    . dalfampridine 10 MG TB12 Take 10 mg by mouth every 12 (twelve) hours.    . fluticasone (FLONASE) 50 MCG/ACT nasal spray     . mirtazapine (REMERON) 30 MG tablet Take 35 mg by mouth at bedtime.   2  . Multiple Vitamins-Minerals (CENTRUM SILVER ULTRA WOMENS PO) Take by mouth.    Marland Kitchen ocrelizumab (OCREVUS) 300 MG/10ML injection Inject 20 mLs (600 mg total) into the vein every 6 (six) months. 20 mL 0  . sertraline (ZOLOFT) 100 MG tablet Take 200 mg by mouth at bedtime.     . sodium chloride (V-R NASAL SPRAY SALINE) 0.65 % nasal spray Place into the nose.     No current facility-administered medications for this visit.    Facility-Administered Medications Ordered in Other Visits  Medication Dose Route Frequency Provider Last Rate Last Dose  . 0.9 %  sodium chloride infusion   Intravenous Continuous Lequita Asal, MD 10 mL/hr at 04/25/16 0855    . acetaminophen (TYLENOL) tablet 650 mg  650 mg Oral Once Lequita Asal, MD        Review of Systems:  GENERAL:  Difficulties coming off anti-depressants.  No fevers or sweats.  Weight up 3 pounds. PERFORMANCE STATUS (ECOG):  2 HEENT:  No visual changes, runny nose, sore throat, mouth sores or tenderness. Lungs: No shortness of breath or cough.  No hemoptysis. Cardiac:  No chest pain, palpitations, orthopnea, or PND. GI:  Constipation.  No nausea, vomiting, diarrhea, melena or hematochezia.  Up-to-date on colonoscopy. GU:  No  urgency, frequency, dysuria, or hematuria. Musculoskeletal:  Back problems due to scoliosis.  Neck pain.  No joint pain.  No muscle tenderness. Extremities:  No pain or swelling. Skin:  No rashes or skin changes. Neuro:  General weakness.  Unsteady gait.  No headache, numbness or focal weakness. Endocrine:  No diabetes, thyroid issues, hot flashes or night sweats. Psych:  Anxiety.  No depression. Pain:  No focal pain. Review of systems:  All other systems reviewed and found to be negative.   Physical Exam: Blood pressure 140/85, pulse 88, temperature 98.6 F (37.0 C), temperature source Tympanic, resp. rate 18, height 5\' 3"  (1.6 m), weight 162 lb (73.5 kg). GENERAL:  Well developed, well nourished, sitting comfortably in a  wheelchair in the exam room in no acute distress. MENTAL STATUS:  Alert and oriented to person, place and time. HEAD:  Styled brown hair.  Normocephalic, atraumatic, face symmetric, no Cushingoid features. EYES:  Brown eyes.  Pupils equal round and reactive to light and accomodation.  No conjunctivitis or scleral icterus. ENT:  Oropharynx clear without lesion.  Patchy tongue. Mucous membranes moist.  RESPIRATORY:  Clear to auscultation without rales, wheezes or rhonchi. CARDIOVASCULAR:  Regular rate and rhythm without murmur, rub or gallop. ABDOMEN:  Soft, non-tender, with active bowel sounds, and no hepatosplenomegaly.  No masses. SKIN:  Decreased RNLF secondary to lip (Moh's surgery).  No rashes or ulcers.  EXTREMITIES: Mild left foot edema.  No skin discoloration or tenderness.  No palpable cords. LYMPH NODES: No palpable cervical, supraclavicular, axillary or inguinal adenopathy  NEUROLOGICAL: Alert & oriented, cranial nerves II-XII intact; motor strength symmetric; sensation intact.  Dysmetria/shaky with finger to nose on left.  Reflexes 2++ left patellar and 2+ right patellar. PSYCH:  Appropriate.  Tearful at times.   No visits with results within 3 Day(s) from  this visit.  Latest known visit with results is:  Abstract on 06/05/2017  Component Date Value Ref Range Status  . HM Colonoscopy 05/18/2017 See Report (in chart)  See Report (in chart), Patient Reported Final   repot in chart repeat 8/21    Assessment:  Carly [name] is a 67 y.o. female with relapsing multiple sclerosis for 4 years.  She received Tysabri monthly (last 02/13/2016).  She has had stability in function.  She began Ocrevus on 04/11/2016 (last 04/22/2017).  Hepatitis testing was negative.  She developed herpes zoster on 03/14/2016.   Symptomatically, she is tapering off her anti-depressants.  Exam reveals left sided dysmetria and brisk reflexes.  Plan: 1.  Discuss interim events.  Discuss plan to continue Octrevus per Dr. Effie Shy.  She will receive Ocrevus every 6 months.  She will receive the same premedications. 2.  Ocrevus 600 mg IV today. 3.  Follow-up as scheduled with Dr. Jacqulynn Cadet. 4.  RTC in 6 months for MD assessment and Ocrevus.   Nolon Stalls, MD 10/14/2017,4:06 AM

## 2017-10-14 ENCOUNTER — Inpatient Hospital Stay: Payer: Medicare Other | Attending: Hematology and Oncology | Admitting: Hematology and Oncology

## 2017-10-14 ENCOUNTER — Inpatient Hospital Stay: Payer: Medicare Other

## 2017-10-14 ENCOUNTER — Other Ambulatory Visit: Payer: Self-pay | Admitting: Hematology and Oncology

## 2017-10-14 VITALS — BP 138/89 | HR 88 | Temp 98.0°F | Resp 20 | Wt 166.0 lb

## 2017-10-14 VITALS — BP 152/88 | HR 99 | Temp 97.6°F | Resp 20

## 2017-10-14 DIAGNOSIS — G35 Multiple sclerosis: Secondary | ICD-10-CM | POA: Diagnosis not present

## 2017-10-14 DIAGNOSIS — B029 Zoster without complications: Secondary | ICD-10-CM | POA: Diagnosis not present

## 2017-10-14 DIAGNOSIS — Z79899 Other long term (current) drug therapy: Secondary | ICD-10-CM | POA: Diagnosis not present

## 2017-10-14 MED ORDER — METHYLPREDNISOLONE SODIUM SUCC 125 MG IJ SOLR
INTRAMUSCULAR | Status: AC
  Filled 2017-10-14: qty 2

## 2017-10-14 MED ORDER — ACETAMINOPHEN 325 MG PO TABS
ORAL_TABLET | ORAL | Status: AC
  Filled 2017-10-14: qty 2

## 2017-10-14 MED ORDER — DIPHENHYDRAMINE HCL 50 MG/ML IJ SOLN
INTRAMUSCULAR | Status: AC
  Filled 2017-10-14: qty 1

## 2017-10-14 MED ORDER — SODIUM CHLORIDE 0.9 % IV SOLN
Freq: Once | INTRAVENOUS | Status: AC
  Administered 2017-10-14: 10:00:00 via INTRAVENOUS
  Filled 2017-10-14: qty 1000

## 2017-10-14 MED ORDER — SODIUM CHLORIDE 0.9 % IV SOLN
600.0000 mg | Freq: Once | INTRAVENOUS | Status: AC
  Administered 2017-10-14: 600 mg via INTRAVENOUS
  Filled 2017-10-14: qty 20

## 2017-10-14 MED ORDER — ACETAMINOPHEN 325 MG PO TABS
650.0000 mg | ORAL_TABLET | Freq: Once | ORAL | Status: AC
  Administered 2017-10-14: 650 mg via ORAL

## 2017-10-14 MED ORDER — DIPHENHYDRAMINE HCL 50 MG/ML IJ SOLN
25.0000 mg | Freq: Once | INTRAMUSCULAR | Status: AC
  Administered 2017-10-14: 50 mg via INTRAVENOUS

## 2017-10-14 MED ORDER — METHYLPREDNISOLONE SODIUM SUCC 125 MG IJ SOLR
100.0000 mg | Freq: Once | INTRAMUSCULAR | Status: AC
  Administered 2017-10-14: 100 mg via INTRAVENOUS

## 2017-10-14 NOTE — Progress Notes (Signed)
Patient states she has stopped taking Zoloft.  She is having withdrawals from the medication.  She is having nausea, not sleeping well and decreased appetite.   Also states she is moody and feels stressed because of a skin cancer on her face.

## 2017-10-14 NOTE — Patient Instructions (Signed)
Ocrelizumab injection What is this medicine? OCRELIZUMAB (ok re LIZ ue mab) treats multiple sclerosis. It helps to decrease the number of multiple sclerosis relapses. It is not a cure. This medicine may be used for other purposes; ask your health care provider or pharmacist if you have questions. COMMON BRAND NAME(S): OCREVUS What should I tell my health care provider before I take this medicine? They need to know if you have any of these conditions: -cancer -hepatitis B infection -other infection (especially a virus infection such as chickenpox, cold sores, or herpes) -an unusual or allergic reaction to ocrelizumab, other medicines, foods, dyes or preservatives -pregnant or trying to get pregnant -breast-feeding How should I use this medicine? This medicine is for infusion into a vein. It is given by a health care professional in a hospital or clinic setting. Talk to your pediatrician regarding the use of this medicine in children. Special care may be needed. Overdosage: If you think you have taken too much of this medicine contact a poison control center or emergency room at once. NOTE: This medicine is only for you. Do not share this medicine with others. What if I miss a dose? Keep appointments for follow-up doses as directed. It is important not to miss your dose. Call your doctor or health care professional if you are unable to keep an appointment. What may interact with this medicine? -alemtuzumab -daclizumab -dimethyl fumarate -fingolimod -glatiramer -interferon beta -live virus vaccines -mitoxantrone -natalizumab -peginterferon beta -rituximab -steroid medicines like prednisone or cortisone -teriflunomide This list may not describe all possible interactions. Give your health care provider a list of all the medicines, herbs, non-prescription drugs, or dietary supplements you use. Also tell them if you smoke, drink alcohol, or use illegal drugs. Some items may interact with  your medicine. What should I watch for while using this medicine? Tell your doctor or healthcare professional if your symptoms do not start to get better or if they get worse. This medicine can cause serious allergic reactions. To reduce your risk you may need to take medicine before treatment with this medicine. Take your medicine as directed. Women should inform their doctor if they wish to become pregnant or think they might be pregnant. There is a potential for serious side effects to an unborn child. Talk to your health care professional or pharmacist for more information. Female patients should use effective birth control methods while receiving this medicine and for 6 months after the last dose. Call your doctor or health care professional for advice if you get a fever, chills or sore throat, or other symptoms of a cold or flu. Do not treat yourself. This drug decreases your body's ability to fight infections. Try to avoid being around people who are sick. If you have a hepatitis B infection or a history of a hepatitis B infection, talk to your doctor. The symptoms of hepatitis B may get worse if you take this medicine. In some patients, this medicine may cause a serious brain infection that may cause death. If you have any problems seeing, thinking, speaking, walking, or standing, tell your doctor right away. If you cannot reach your doctor, urgently seek other source of medical care. This medicine can decrease the response to a vaccine. If you need to get vaccinated, tell your healthcare professional if you have received this medicine. Extra booster doses may be needed. Talk to your doctor to see if a different vaccination schedule is needed. Talk to your doctor about your risk of cancer.   You may be more at risk for certain types of cancers if you take this medicine. What side effects may I notice from receiving this medicine? Side effects that you should report to your doctor or health care  professional as soon as possible: -allergic reactions like skin rash, itching or hives, swelling of the face, lips, or tongue -breathing problems -facial flushing -fast, irregular heartbeat -lump or soreness in the breast -signs and symptoms of herpes such as cold sore, shingles, or genital sores -signs and symptoms of infection like fever or chills, cough, sore throat, pain or trouble passing urine -signs and symptoms of low blood pressure like dizziness; feeling faint or lightheaded, falls; unusually weak or tired -signs and symptoms of progressive multifocal leukoencephalopathy (PML) like changes in vision; clumsiness; confusion; personality changes; weakness on one side of the body -swelling of the ankles, feet, hands Side effects that usually do not require medical attention (report these to your doctor or health care professional if they continue or are bothersome): -back pain -depressed mood -diarrhea -pain, redness, or irritation at site where injected This list may not describe all possible side effects. Call your doctor for medical advice about side effects. You may report side effects to FDA at 1-800-FDA-1088. Where should I keep my medicine? This drug is given in a hospital or clinic and will not be stored at home. NOTE: This sheet is a summary. It may not cover all possible information. If you have questions about this medicine, talk to your doctor, pharmacist, or health care provider.  2018 Elsevier/Gold Standard (2016-01-15 09:40:25)  

## 2017-10-14 NOTE — Progress Notes (Signed)
Patient was tearful today and reports that she is weening off of Zoloft with physician guidance.  She complains of back discomfort and nausea.  She was assisted to the bathroom several times today and then back to her chair.  She was concerned that the Ocrevus was causing her emotions to be unsteady but she was tearful during her MD visit prior to infusion.  Patient is to return in July for her next appointment.

## 2017-11-01 ENCOUNTER — Encounter: Payer: Self-pay | Admitting: Hematology and Oncology

## 2017-11-16 ENCOUNTER — Ambulatory Visit: Payer: Self-pay | Admitting: *Deleted

## 2017-11-16 ENCOUNTER — Ambulatory Visit (INDEPENDENT_AMBULATORY_CARE_PROVIDER_SITE_OTHER): Payer: Medicare Other

## 2017-11-16 ENCOUNTER — Encounter: Payer: Self-pay | Admitting: Internal Medicine

## 2017-11-16 ENCOUNTER — Ambulatory Visit: Payer: Medicare Other | Admitting: Internal Medicine

## 2017-11-16 VITALS — BP 142/80 | HR 106 | Temp 98.7°F | Resp 18 | Wt 169.8 lb

## 2017-11-16 DIAGNOSIS — I10 Essential (primary) hypertension: Secondary | ICD-10-CM

## 2017-11-16 DIAGNOSIS — G35 Multiple sclerosis: Secondary | ICD-10-CM

## 2017-11-16 DIAGNOSIS — K5900 Constipation, unspecified: Secondary | ICD-10-CM | POA: Diagnosis not present

## 2017-11-16 DIAGNOSIS — F329 Major depressive disorder, single episode, unspecified: Secondary | ICD-10-CM

## 2017-11-16 DIAGNOSIS — F32A Depression, unspecified: Secondary | ICD-10-CM

## 2017-11-16 NOTE — Telephone Encounter (Signed)
I will see her and evaluate and see what we can do for her here to help.

## 2017-11-16 NOTE — Telephone Encounter (Signed)
Noted.  Please call and confirm no vomiting or severe abdominal pain.  If vomiting and acute symptoms, let me know.

## 2017-11-16 NOTE — Telephone Encounter (Signed)
Patient aware.

## 2017-11-16 NOTE — Telephone Encounter (Signed)
Pt's husband states this patient has not had a bm in over a week. She is having abd pain at times. She has taken laxatives and used suppositories but has only had like a small bm. She has a hx of MS and was started on a new medicine Trintellix and this causes nausea and whatever she take for nausea causes her to be constipated.  Colonoscopy was done last August which showed internal hemorrhoids.  Appointment made for today with Dr. Nicki Reaper.  Reason for Disposition . [1] Rectal pain or fullness from fecal impaction (rectum full of stool) AND [2] NOT better after SITZ bath, suppository or enema  Answer Assessment - Initial Assessment Questions 1. STOOL PATTERN OR FREQUENCY: "How often do you pass bowel movements (BMs)?"  (Normal range: tid to q 3 days)  "When was the last BM passed?"       Normally every 3rd day. Last bm has been over a week. 2. STRAINING: "Do you have to strain to have a BM?"      yes 3. RECTAL PAIN: "Does your rectum hurt when the stool comes out?" If so, ask: "Do you have hemorrhoids? How bad is the pain?"  (Scale 1-10; or mild, moderate, severe)     Hemorrhoids and pain being 7 or 8 4. STOOL COMPOSITION: "Are the stools hard?"      yes 5. BLOOD ON STOOLS: "Has there been any blood on the toilet tissue or on the surface of the BM?" If so, ask: "When was the last time?"      Not seen any blood 6. CHRONIC CONSTIPATION: "Is this a new problem for you?"  If no, ask: How long have you had this problem?" (days, weeks, months)      yes 7. CHANGES IN DIET: "Have there been any recent changes in your diet?"      no 8. MEDICATIONS: "Have you been taking any new medications?"     Trintellix 10mg  daily 9. LAXATIVES: "Have you been using any laxatives or enemas?"  If yes, ask "What, how often, and when was the last time?"     Laxatives and suppositories  10. CAUSE: "What do you think is causing the constipation?"        New med 11. OTHER SYMPTOMS: "Do you have any other symptoms?"  (e.g., abdominal pain, fever, vomiting)       Abdominal pain, vomited once last week 12. PREGNANCY: "Is there any chance you are pregnant?" "When was your last menstrual period?"       no  Protocols used: CONSTIPATION-A-AH

## 2017-11-16 NOTE — Progress Notes (Signed)
Patient ID: Carly [name], female   DOB: 1951/07/22, 67 y.o.   MRN: [id number]   Subjective:    Patient ID: Carly [name], female    DOB: 10-Apr-1951, 67 y.o.   MRN: [id number]  HPI  Patient here as a work in with concerns regarding constipation.  She is accompanied by her husband.  history obtained from both of them.  States she has had problems with constipation for most of her life.  Has worsened recently.  States she started trintillex just a little over two weeks ago.  Also tapering her nefazodone.  Was questioning if this could be contributing.  Had to take mag citrate two weeks ago.  Had good results.  She has been eating prunes, increased pineapple, apples and pears.  Has tried suppositories.  No bowels movement in over one week.  She is eating, but reports decreased appetite.  No vomiting.  Some nausea.  Taking zofran and pepto bismol.  No increased abdominal pain.  Is stressed about her bowels. Husband reports that this is increasing her anxiety.     Past Medical History:  Diagnosis Date  . Allergy   . Anxiety   . Aspiration pneumonia ([place])   . Depression   . Frequent headaches    H/O  . GERD (gastroesophageal reflux disease)   . History of chicken pox   . History of colon polyps   . Hx of migraines   . Multiple sclerosis ([place])   . PONV (postoperative nausea and vomiting)    Past Surgical History:  Procedure Laterality Date  . BACK SURGERY    . CHOLECYSTECTOMY    . COLONOSCOPY WITH PROPOFOL N/A 05/18/2017   Procedure: COLONOSCOPY WITH PROPOFOL;  Surgeon: [name], MD;  Location: [facility] ENDOSCOPY;  Service: Endoscopy;  Laterality: N/A;  . FOOT SURGERY  2015  . GALLBLADDER SURGERY  2008  . HARDWARE REMOVAL Left 02/14/2016   Procedure: LEFT FOOT REMOVAL DEEP IMPLANT;  Surgeon: [name], MD;  Location: [place];  Service: Orthopedics;  Laterality: Left;  . HERNIA REPAIR     Inguinal Hernia Repair  . SPINE SURGERY  2014   Family History    Problem Relation Age of Onset  . Arthritis Mother   . Hypertension Mother   . Macular degeneration Mother   . Hypertension Father   . Hyperlipidemia Father   . Heart disease Maternal Grandfather   . Diabetes Maternal Grandfather   . Kidney disease Paternal Grandmother    Social History   Socioeconomic History  . Marital status: Married    Spouse name: None  . Number of children: None  . Years of education: None  . Highest education level: None  Social Needs  . Financial resource strain: None  . Food insecurity - worry: None  . Food insecurity - inability: None  . Transportation needs - medical: None  . Transportation needs - non-medical: None  Occupational History  . None  Tobacco Use  . Smoking status: Never Smoker  . Smokeless tobacco: Never Used  Substance and Sexual Activity  . Alcohol use: No    Alcohol/week: 0.0 oz  . Drug use: No  . Sexual activity: Not Currently  Other Topics Concern  . None  Social History Narrative  . None    Outpatient Encounter Medications as of 11/16/2017  Medication Sig  . acetaminophen (TYLENOL) 325 MG tablet Take 650 mg by mouth every 4 (four) hours as needed.  . ALPRAZolam (XANAX) 0.5 MG tablet  Take by mouth. Reported on 04/09/2016  . B Complex Vitamins (VITAMIN B COMPLEX PO) Take by mouth.  . Calcium-Phosphorus-Vitamin D (CITRACAL +D3 PO) Take by mouth. 2 tablets twice a day  . Cholecalciferol (D3-1000 PO) Take by mouth.  . Coenzyme Q10 (CO Q10) 100 MG CAPS Take by mouth.  . dalfampridine 10 MG TB12 Take 10 mg by mouth every 12 (twelve) hours.  . Multiple Vitamins-Minerals (CENTRUM SILVER ULTRA WOMENS PO) Take by mouth.  Marland Kitchen ocrelizumab (OCREVUS) 300 MG/10ML injection Inject 20 mLs (600 mg total) into the vein every 6 (six) months.  . sodium chloride (V-R NASAL SPRAY SALINE) 0.65 % nasal spray Place into the nose.  . fluticasone (FLONASE) 50 MCG/ACT nasal spray   . mirtazapine (REMERON) 30 MG tablet Take 35 mg by mouth at bedtime.    . nefazodone (SERZONE) 100 MG tablet Take 200 mg by mouth at bedtime.  . sertraline (ZOLOFT) 100 MG tablet Take 200 mg by mouth at bedtime.    Facility-Administered Encounter Medications as of 11/16/2017  Medication  . 0.9 %  sodium chloride infusion  . acetaminophen (TYLENOL) tablet 650 mg    Review of Systems  Constitutional: Negative for unexpected weight change.       Some decreased appetite.  Weight is up.   HENT: Negative for congestion and sinus pressure.   Respiratory: Negative for chest tightness and shortness of breath.   Cardiovascular: Negative for chest pain and leg swelling.  Gastrointestinal: Positive for constipation and nausea. Negative for abdominal pain, diarrhea and vomiting.  Genitourinary: Positive for frequency. Negative for difficulty urinating and dysuria.  Musculoskeletal: Negative for myalgias.  Skin: Negative for rash.  Neurological: Negative for dizziness and headaches.  Psychiatric/Behavioral:       Increased anxiety as outlined.         Objective:    Physical Exam  Constitutional: She appears well-developed and well-nourished. No distress.  Neck: Neck supple.  Cardiovascular: Normal rate and regular rhythm.  Pulmonary/Chest: Breath sounds normal. No respiratory distress. She has no wheezes.  Abdominal: Soft. Bowel sounds are normal. There is no tenderness.  Genitourinary:  Genitourinary Comments: Rectal exam:  Stool present in the anal canal.  Removed. Pt tolerated well. No pain.    Musculoskeletal: She exhibits no edema or tenderness.  Lymphadenopathy:    She has no cervical adenopathy.  Skin: No rash noted. No erythema.  Psychiatric: Her behavior is normal.    BP (!) 142/80 (BP Location: Right Arm, Patient Position: Sitting, Cuff Size: Large)   Pulse (!) 106   Temp 98.7 F (37.1 C) (Oral)   Resp 18   Wt 169 lb 12.8 oz (77 kg)   SpO2 97%   BMI 30.08 kg/m  Wt Readings from Last 3 Encounters:  11/16/17 169 lb 12.8 oz (77 kg)  10/14/17  166 lb (75.3 kg)  05/18/17 152 lb (68.9 kg)     Lab Results  Component Value Date   WBC 8.8 02/24/2017   HGB 14.9 02/24/2017   HCT 44.0 02/24/2017   PLT 314 02/24/2017   GLUCOSE 115 (H) 03/10/2017   CHOL 254 (H) 03/10/2017   TRIG 201.0 (H) 03/10/2017   HDL 70.90 03/10/2017   LDLDIRECT 138.0 03/10/2017   LDLCALC 148 (H) 07/15/2016   ALT 24 03/10/2017   AST 25 03/10/2017   NA 142 03/10/2017   K 4.8 03/10/2017   CL 105 03/10/2017   CREATININE 0.79 03/10/2017   BUN 13 03/10/2017   CO2 26 03/10/2017  TSH 1.43 03/10/2017   HGBA1C 5.4 03/10/2017    Mm Screening Breast Tomo Bilateral  Result Date: 05/05/2017 CLINICAL DATA:  Screening. EXAM: 2D DIGITAL SCREENING BILATERAL MAMMOGRAM WITH CAD AND ADJUNCT TOMO COMPARISON:  Previous exam(s). ACR Breast Density Category c: The breast tissue is heterogeneously dense, which may obscure small masses. FINDINGS: There are no findings suspicious for malignancy. Images were processed with CAD. IMPRESSION: No mammographic evidence of malignancy. A result letter of this screening mammogram will be mailed directly to the patient. RECOMMENDATION: Screening mammogram in one year. (Code:SM-B-01Y) BI-RADS CATEGORY  1: Negative. Electronically Signed   By: Fidela Salisbury M.D.   On: 05/05/2017 14:29       Assessment & Plan:   Problem List Items Addressed This Visit    BP (high blood pressure)    Elevated today - slightly.  Feel related to current symptoms and problems with constipation.  Follow.  Hold on additional medication.        Constipation - Primary    Increased constipation as outlined.  Has had problems most of her life, but has worsened recently.  Stool removed today as outlined.  Hopefully this will stimulate her bowels.  Check abdominal xray.  Abdomen soft.  Bowels sounds are present.  Will have her use enema tonight as directed.  Call in am.  GI evaluation.  Also d/w [name] regarding her medications.        Relevant Orders   DG  Abd 1 View   Depression    Followed by [name].  Just had trintillex added.  Tapering serzone.  Had questions if this could be contributing to her constipation.  Increased stress related to the constipation.  Discussed with her today.  Will d/w [name].        Multiple sclerosis ([place])    Followed by Dr Mike Gip.  On octrevus.  Follow.           Einar Pheasant, MD

## 2017-11-16 NOTE — Telephone Encounter (Signed)
Patient has tried prunes, pears, stool softeners, supppositories, smooth move, magnesium citrate. Patient says  She has not vomited at all but has been very nauseous. She has taken zofran, zantac, xanax. Patient says it has been atleast a week since she has had a good BM. She says she has no appetite. She thinks she may be impacted.

## 2017-11-16 NOTE — Telephone Encounter (Signed)
Patient is on schedule for 4:00 today.

## 2017-11-17 ENCOUNTER — Telehealth: Payer: Self-pay | Admitting: Internal Medicine

## 2017-11-17 ENCOUNTER — Encounter: Payer: Self-pay | Admitting: Internal Medicine

## 2017-11-17 ENCOUNTER — Emergency Department: Payer: Medicare Other

## 2017-11-17 ENCOUNTER — Ambulatory Visit: Payer: Self-pay

## 2017-11-17 ENCOUNTER — Other Ambulatory Visit: Payer: Self-pay

## 2017-11-17 ENCOUNTER — Emergency Department
Admission: EM | Admit: 2017-11-17 | Discharge: 2017-11-17 | Disposition: A | Discharge status: Home / Self Care | Payer: Medicare Other | Attending: Student in an Organized Health Care Education/Training Program | Admitting: Student in an Organized Health Care Education/Training Program

## 2017-11-17 DIAGNOSIS — K5900 Constipation, unspecified: Secondary | ICD-10-CM | POA: Diagnosis not present

## 2017-11-17 DIAGNOSIS — G35 Multiple sclerosis: Secondary | ICD-10-CM | POA: Diagnosis not present

## 2017-11-17 DIAGNOSIS — Z79899 Other long term (current) drug therapy: Secondary | ICD-10-CM | POA: Insufficient documentation

## 2017-11-17 DIAGNOSIS — F329 Major depressive disorder, single episode, unspecified: Secondary | ICD-10-CM | POA: Insufficient documentation

## 2017-11-17 DIAGNOSIS — R110 Nausea: Secondary | ICD-10-CM | POA: Diagnosis present

## 2017-11-17 MED ORDER — METOCLOPRAMIDE HCL 5 MG/ML IJ SOLN
10.0000 mg | Freq: Once | INTRAMUSCULAR | Status: DC
  Filled 2017-11-17: qty 2

## 2017-11-17 MED ORDER — METOCLOPRAMIDE HCL 10 MG PO TABS: 10.0000 mg | ORAL_TABLET | Freq: Four times a day (QID) | ORAL | 0 refills | Status: DC | PRN

## 2017-11-17 MED ORDER — METOCLOPRAMIDE HCL 10 MG PO TABS
10.0000 mg | ORAL_TABLET | Freq: Once | ORAL | Status: AC
  Administered 2017-11-17: 10 mg via ORAL
  Filled 2017-11-17: qty 1

## 2017-11-17 MED ORDER — LACTULOSE 10 GM/15ML PO SOLN
30.0000 g | Freq: Once | ORAL | Status: AC
  Administered 2017-11-17: 30 g via ORAL
  Filled 2017-11-17: qty 60

## 2017-11-17 MED ORDER — LACTULOSE 10 G PO PACK: 10.0000 g | PACK | Freq: Every day | ORAL | 0 refills | Status: DC

## 2017-11-17 NOTE — Assessment & Plan Note (Signed)
Followed by [name].  Just had trintillex added.  Tapering serzone.  Had questions if this could be contributing to her constipation.  Increased stress related to the constipation.  Discussed with her today.  Will d/w [name].

## 2017-11-17 NOTE — Telephone Encounter (Signed)
I am ok with her trying another enema, but if she is not eating and having worsening problems, ok for ER evaluation.  I have called GI to see about work in.  If increased problems, she may need CT scan, etc and further w/up in ER.

## 2017-11-17 NOTE — ED Triage Notes (Addendum)
Pt c/o constipation, states she was seen at [facility] yesterday and then attempted to digitally remove stool with minimal success and did 2 enemas at home with only passed 6-7 hard pellets. States she is feeling nausea with no relief from the constipation. Abd xray were performed yesterday.

## 2017-11-17 NOTE — Telephone Encounter (Unsigned)
Copied from [place] [phone]. Topic: Quick Communication - See Telephone Encounter >> Nov 17, 2017  9:40 AM Hewitt Shorts wrote: CRM for notification. See Telephone encounter for:  Pt saw Dr. Nicki Reaper yesterday and and felt better before she left but now she is feeling nauseated and she wants to know if she needs to be seen again or do another enama best number [phone]  Pt could not eat again this morning  11/17/17.

## 2017-11-17 NOTE — ED Notes (Signed)
Pt presents today for constipation. Pt was seen at Dr Nicki Reaper yesterday whom did a fecal impaction removal but pt states it didn't work. Pt has taken 2 fleet enemas as well last night as per instructions. Pt is NAD with family at bedside. Awaiting EDP.

## 2017-11-17 NOTE — Telephone Encounter (Signed)
Please advise 

## 2017-11-17 NOTE — Assessment & Plan Note (Signed)
Followed by Dr Mike Gip.  On octrevus.  Follow.

## 2017-11-17 NOTE — Assessment & Plan Note (Signed)
Increased constipation as outlined.  Has had problems most of her life, but has worsened recently.  Stool removed today as outlined.  Hopefully this will stimulate her bowels.  Check abdominal xray.  Abdomen soft.  Bowels sounds are present.  Will have her use enema tonight as directed.  Call in am.  GI evaluation.  Also d/w [name] regarding her medications.

## 2017-11-17 NOTE — Assessment & Plan Note (Signed)
Elevated today - slightly.  Feel related to current symptoms and problems with constipation.  Follow.  Hold on additional medication.

## 2017-11-17 NOTE — Telephone Encounter (Signed)
Pt. Called again. States she had very small results with her Fleets enema last night. Instructed she could try another Fleets today. States she wants to wait and see what Dr. Nicki Reaper thinks. If she should try the enema "or go to the ED to get cleaned out." States she still does not have an appetite.

## 2017-11-17 NOTE — Telephone Encounter (Signed)
See telephone encounter for today.

## 2017-11-17 NOTE — Discharge Instructions (Signed)
Take medications as prescribed.  Follow-up with PCP and GI.  He can try mixing 1 tablespoon  of mineral oil with 8 ounces of prune juice to help loosen stools.  Be sure to drink plenty of fluids.  Return for worsening pain or for any additional questions or concerns.

## 2017-11-17 NOTE — Telephone Encounter (Signed)
Patient is going to ED for further eval. She is aware of x-ray results and has tried 2 enemas last night with no relief. She stated she thought she felt better when she left the office but symptoms are back. Not worsening but persistent. Advised patient to go to ED.

## 2017-11-17 NOTE — Telephone Encounter (Signed)
FYI

## 2017-11-17 NOTE — ED Provider Notes (Signed)
[facility] Emergency Department Provider Note    None    (approximate)  I have reviewed the triage vital signs and the nursing notes.   HISTORY  Chief Complaint Constipation    HPI Carly [name] is a 67 y.o. female history of MS as well as anxiety depression the patient presents for evaluation of nausea constipation failing home management.  Patient was seen in PCPs office yesterday and had disimpaction performed.  She went home and had symptoms and tried doing 2 enemas at home without improvement.  Denies any fevers.  No vomiting.  Denies any weight loss.  No chest pain or shortness of breath.  States that the symptoms are moderate in severity.  No radiation.  Past Medical History:  Diagnosis Date  . Allergy   . Anxiety   . Aspiration pneumonia ([place])   . Depression   . Frequent headaches    H/O  . GERD (gastroesophageal reflux disease)   . History of chicken pox   . History of colon polyps   . Hx of migraines   . Multiple sclerosis ([place])   . PONV (postoperative nausea and vomiting)    Family History  Problem Relation Age of Onset  . Arthritis Mother   . Hypertension Mother   . Macular degeneration Mother   . Hypertension Father   . Hyperlipidemia Father   . Heart disease Maternal Grandfather   . Diabetes Maternal Grandfather   . Kidney disease Paternal Grandmother    Past Surgical History:  Procedure Laterality Date  . BACK SURGERY    . CHOLECYSTECTOMY    . COLONOSCOPY WITH PROPOFOL N/A 05/18/2017   Procedure: COLONOSCOPY WITH PROPOFOL;  Surgeon: [name], MD;  Location: [facility] ENDOSCOPY;  Service: Endoscopy;  Laterality: N/A;  . FOOT SURGERY  2015  . GALLBLADDER SURGERY  2008  . HARDWARE REMOVAL Left 02/14/2016   Procedure: LEFT FOOT REMOVAL DEEP IMPLANT;  Surgeon: [name], MD;  Location: [place];  Service: Orthopedics;  Laterality: Left;  . HERNIA REPAIR     Inguinal Hernia Repair  . [place] SURGERY   2014   Patient Active Problem List   Diagnosis Date Noted  . Constipation 11/17/2017  . Lower extremity edema 01/13/2016  . Bilateral ovarian cysts 10/16/2015  . Health care maintenance 10/16/2015  . Colon polyp 01/25/2015  . Bone/cartilage disorder 01/25/2015  . Headache 11/18/2014  . Depression 11/18/2014  . Greater tuberosity of humerus fracture 11/18/2014  . Multiple sclerosis ([place]) 11/18/2014  . Unsteady gait 11/18/2014  . Dizziness 11/18/2014  . Inconclusive mammogram 03/15/2013  . Back ache 09/06/2012  . BP (high blood pressure) 09/06/2012  . Back pain 09/06/2012  . Hypercholesterolemia 01/28/2012  . Climacteric 01/28/2012  . Routine general medical examination at a health care facility 01/28/2012  . Avitaminosis D 01/28/2012      Prior to Admission medications   Medication Sig Start Date End Date Taking? Authorizing Provider  acetaminophen (TYLENOL) 325 MG tablet Take 650 mg by mouth every 4 (four) hours as needed.    [provider]  ALPRAZolam Duanne Moron) 0.5 MG tablet Take by mouth. Reported on 04/09/2016    [provider]  B Complex Vitamins (VITAMIN B COMPLEX PO) Take by mouth.    [provider]  Calcium-Phosphorus-Vitamin D (CITRACAL +D3 PO) Take by mouth. 2 tablets twice a day    [provider]  Cholecalciferol (D3-1000 PO) Take by mouth.    [provider]  Coenzyme Q10 (CO Q10) 100 MG CAPS Take by mouth.    [provider]  dalfampridine 10 MG TB12 Take 10 mg by mouth every 12 (twelve) hours.    [provider]  fluticasone Asencion Islam) 50 MCG/ACT nasal spray  09/07/16   [provider]  lactulose (CEPHULAC) 10 g packet Take 1 packet (10 g total) by mouth daily at 6 (six) AM. You may take up to three times daily depending on severity of constipation.  Hold for diarrhea 11/17/17   Merlyn Lot, MD  metoCLOPramide (REGLAN) 10 MG tablet Take 1 tablet (10 mg total) by mouth every 6 (six) hours as  needed for nausea. 11/17/17 11/17/18  Merlyn Lot, MD  mirtazapine (REMERON) 30 MG tablet Take 35 mg by mouth at bedtime.  04/16/17   [provider]  Multiple Vitamins-Minerals (CENTRUM SILVER ULTRA WOMENS PO) Take by mouth.    [provider]  nefazodone (SERZONE) 100 MG tablet Take 200 mg by mouth at bedtime. 09/15/17   [provider]  ocrelizumab (OCREVUS) 300 MG/10ML injection Inject 20 mLs (600 mg total) into the vein every 6 (six) months. 04/22/17   Lequita Asal, MD  sertraline (ZOLOFT) 100 MG tablet Take 200 mg by mouth at bedtime.     [provider]  sodium chloride (V-R NASAL SPRAY SALINE) 0.65 % nasal spray Place into the nose.    [provider]    Allergies Asa [aspirin]; Buspirone; Levofloxacin; Lexapro [escitalopram oxalate]; Motrin [ibuprofen]; Soy allergy; Ceftin [cefuroxime axetil]; Penicillins; and Sulfa antibiotics    Social History Social History   Tobacco Use  . Smoking status: Never Smoker  . Smokeless tobacco: Never Used  Substance Use Topics  . Alcohol use: No    Alcohol/week: 0.0 oz  . Drug use: No    Review of Systems Patient denies headaches, rhinorrhea, blurry vision, numbness, shortness of breath, chest pain, edema, cough, abdominal pain, nausea, vomiting, diarrhea, dysuria, fevers, rashes or hallucinations unless otherwise stated above in HPI. ____________________________________________   PHYSICAL EXAM:  VITAL SIGNS: Vitals:   11/17/17 1502  BP: 138/78  Pulse: 97  Resp: 18  Temp: 97.7 F (36.5 C)  SpO2: 97%    Constitutional: Alert and oriented. Well appearing and in no acute distress. Eyes: Conjunctivae are normal.  Head: Atraumatic. Nose: No congestion/rhinnorhea. Mouth/Throat: Mucous membranes are moist.   Neck: Painless ROM.  Cardiovascular:   Good peripheral circulation. Respiratory: Normal respiratory effort.  No retractions.  Gastrointestinal: Soft and nontender.    Musculoskeletal: No lower extremity tenderness .  No joint effusions. Neurologic:  Normal speech and language. No gross focal neurologic deficits are appreciated.  Skin:  Skin is warm, dry and intact. No rash noted. Psychiatric: Mood and affect are normal. Speech and behavior are normal.  ____________________________________________   LABS (all labs ordered are listed, but only abnormal results are displayed)  No results found for this or any previous visit (from the past 24 hour(s)). ____________________________________________  ____________________________________________  CVELFYBOF  I personally reviewed all radiographic images ordered to evaluate for the above acute complaints and reviewed radiology reports and findings.  These findings were personally discussed with the patient.  Please see medical record for radiology report.  ____________________________________________   PROCEDURES  Procedure(s) performed:  Procedures    Critical Care performed: no ____________________________________________   INITIAL IMPRESSION / ASSESSMENT AND PLAN / ED COURSE  Pertinent labs & imaging results that were available during my care of the patient were reviewed by me  and considered in my medical decision making (see chart for details).  DDX: impaction, sbo, mass, volvulus, constipation, ileus  Carly [name] is a 67 y.o. who presents to the ED with symptoms as described above.  CT imaging ordered for the above differential shows no acute abnormality but moderate stool burden.  No evidence of rectal impaction.  Patient does not seem to be optimizing her oral medication and diet management for constipation.  Have recommended over-the-counter stool softeners including previous and mineral oil.  Will also prescribe lactulose.  May also have a component of slow transit thing given her nausea will give trial of Reglan.  This point did not feel that further diagnostic testing clinically  indicated as her abdominal exam is soft and benign.  Stable appropriate for trial of outpatient management.      ____________________________________________   FINAL CLINICAL IMPRESSION(S) / ED DIAGNOSES  Final diagnoses:  Constipation, unspecified constipation type      NEW MEDICATIONS STARTED DURING THIS VISIT:  New Prescriptions   LACTULOSE ([place]) 10 G PACKET    Take 1 packet (10 g total) by mouth daily at 6 (six) AM. You may take up to three times daily depending on severity of constipation.  Hold for diarrhea   METOCLOPRAMIDE (REGLAN) 10 MG TABLET    Take 1 tablet (10 mg total) by mouth every 6 (six) hours as needed for nausea.     Note:  This document was prepared using Dragon voice recognition software and may include unintentional dictation errors.     Merlyn Lot, MD 11/17/17 [phone]

## 2017-12-07 ENCOUNTER — Telehealth: Payer: Self-pay

## 2017-12-07 NOTE — Telephone Encounter (Signed)
Copied from [place] [id number]. Topic: Referral - Request >> Dec 07, 2017  4:09 PM Oliver Pila B wrote: Reason for CRM: pt wants a referral to see Dr. Kerman Passey neurologist @ [facility]; pt is needing a closer Dr for her MS

## 2017-12-08 ENCOUNTER — Other Ambulatory Visit: Payer: Self-pay | Admitting: Internal Medicine

## 2017-12-08 DIAGNOSIS — G35 Multiple sclerosis: Secondary | ICD-10-CM

## 2017-12-08 NOTE — Telephone Encounter (Signed)
Order placed for neurology referral to Dr Felecia Shelling.

## 2017-12-08 NOTE — Progress Notes (Signed)
Order placed for neurology referral to Dr Felecia Shelling.

## 2017-12-08 NOTE — Telephone Encounter (Signed)
Please advise 

## 2017-12-20 ENCOUNTER — Emergency Department: Payer: Medicare Other

## 2017-12-20 DIAGNOSIS — Z79899 Other long term (current) drug therapy: Secondary | ICD-10-CM | POA: Diagnosis not present

## 2017-12-20 DIAGNOSIS — R51 Headache: Secondary | ICD-10-CM | POA: Diagnosis not present

## 2017-12-20 DIAGNOSIS — I10 Essential (primary) hypertension: Secondary | ICD-10-CM | POA: Insufficient documentation

## 2017-12-20 DIAGNOSIS — T43225A Adverse effect of selective serotonin reuptake inhibitors, initial encounter: Secondary | ICD-10-CM | POA: Diagnosis not present

## 2017-12-20 LAB — BASIC METABOLIC PANEL
Anion gap: 10 (ref 5–15)
BUN: 7 mg/dL (ref 6–20)
CHLORIDE: 103 mmol/L (ref 101–111)
CO2: 23 mmol/L (ref 22–32)
CREATININE: 0.57 mg/dL (ref 0.44–1.00)
Calcium: 9.4 mg/dL (ref 8.9–10.3)
GFR calc Af Amer: >60 mL/min (ref >60)
GFR calc non Af Amer: >60 mL/min (ref >60)
GLUCOSE: 139 mg/dL — AB (ref 65–99)
POTASSIUM: 4.1 mmol/L (ref 3.5–5.1)
Sodium: 136 mmol/L (ref 135–145)

## 2017-12-20 LAB — CBC
HEMATOCRIT: 42.9 % (ref 35.0–47.0)
Hemoglobin: 14.5 g/dL (ref 12.0–16.0)
MCH: 28.6 pg (ref 26.0–34.0)
MCHC: 33.8 g/dL (ref 32.0–36.0)
MCV: 84.6 fL (ref 80.0–100.0)
PLATELETS: 350 10*3/uL (ref 150–440)
RBC: 5.07 MIL/uL (ref 3.80–5.20)
RDW: 14.4 % (ref 11.5–14.5)
WBC: 9.8 10*3/uL (ref 3.6–11.0)

## 2017-12-20 LAB — TROPONIN I: Troponin I: <0.03 ng/mL (ref <0.03)

## 2017-12-20 NOTE — ED Triage Notes (Signed)
Patient c/o hypertension. Patient reports recent change on antidepressant. Patient has hx of MS. Patient c/o headache. Patient reports 192/89 at home today.

## 2017-12-21 ENCOUNTER — Emergency Department: Payer: Medicare Other

## 2017-12-21 ENCOUNTER — Emergency Department
Admission: EM | Admit: 2017-12-21 | Discharge: 2017-12-21 | Disposition: A | Discharge status: Home / Self Care | Payer: Medicare Other | Attending: Emergency Medicine | Admitting: Emergency Medicine

## 2017-12-21 DIAGNOSIS — R519 Headache, unspecified: Secondary | ICD-10-CM

## 2017-12-21 DIAGNOSIS — R51 Headache: Secondary | ICD-10-CM

## 2017-12-21 DIAGNOSIS — T887XXA Unspecified adverse effect of drug or medicament, initial encounter: Secondary | ICD-10-CM

## 2017-12-21 DIAGNOSIS — I10 Essential (primary) hypertension: Secondary | ICD-10-CM

## 2017-12-21 MED ORDER — BUTALBITAL-APAP-CAFFEINE 50-325-40 MG PO TABS: 1.0000 | ORAL_TABLET | Freq: Four times a day (QID) | ORAL | 0 refills | Status: DC | PRN

## 2017-12-21 MED ORDER — BUTALBITAL-APAP-CAFFEINE 50-325-40 MG PO TABS
2.0000 | ORAL_TABLET | Freq: Once | ORAL | Status: AC
Start: 2017-12-21 — End: 2017-12-21
  Administered 2017-12-21: 2 via ORAL
  Filled 2017-12-21: qty 2

## 2017-12-21 MED ORDER — SODIUM CHLORIDE 0.9 % IV BOLUS (SEPSIS)
1000.0000 mL | Freq: Once | INTRAVENOUS | Status: AC
  Administered 2017-12-21: 1000 mL via INTRAVENOUS

## 2017-12-21 MED ORDER — MAGNESIUM SULFATE 2 GM/50ML IV SOLN
2.0000 g | Freq: Once | INTRAVENOUS | Status: AC
  Administered 2017-12-21: 2 g via INTRAVENOUS
  Filled 2017-12-21: qty 50

## 2017-12-21 NOTE — ED Notes (Signed)
Pt states she has been on antidepressants since 1991. Pt states she recently has had an increase in antidepressant dose. Pt states she was lying in bed tonight when she checked her blood pressure and it was 198/103. Pt states she has had "top of the head" ache for 6 days. Pt states she has been intermittently dizzy. Pt states she feel like her antidepressant is causing these symptoms. Pt with history of MS. Skin normal color warm and dry. resps unlabored. Pt denies nausea.

## 2017-12-21 NOTE — ED Notes (Signed)
Pt states would like her thyroid checked. md notified no new orders received. Report to rebecca, rn.

## 2017-12-21 NOTE — ED Provider Notes (Signed)
[facility] Emergency Department Provider Note   ____________________________________________   First MD Initiated Contact with Patient 12/21/17 [phone]     (approximate)  I have reviewed the triage vital signs and the nursing notes.   HISTORY  Chief Complaint Hypertension    HPI Carly [name] is a 67 y.o. female who comes into the hospital today with a headache and elevated blood pressure.  The patient states that her blood pressure shot up and is never done that before.  The patient has been taking Lexapro which is a new medication.  She reports that she has been taking it for the past 2 weeks and they have been titrating the dose up.  The patient reports that she started having the symptoms of the headache ever since she started taking the Lexapro.  Today her husband decided to check her blood pressure because her headache was so severe and it was 182/89.  The patient has been taking tizanidine to treat the headaches but is concerned that this is due to the Lexapro.  The patient has had some nausea and states that the headache goes around her neck and her head.  The patient rates her headache currently a 7 out of 10 in intensity.  The patient is really concerned that she needs to go down on her Lexapro.  Past Medical History:  Diagnosis Date  . Allergy   . Anxiety   . Aspiration pneumonia ([place])   . Depression   . Frequent headaches    H/O  . GERD (gastroesophageal reflux disease)   . History of chicken pox   . History of colon polyps   . Hx of migraines   . Multiple sclerosis ([place])   . PONV (postoperative nausea and vomiting)     Patient Active Problem List   Diagnosis Date Noted  . Constipation 11/17/2017  . Lower extremity edema 01/13/2016  . Bilateral ovarian cysts 10/16/2015  . Health care maintenance 10/16/2015  . Colon polyp 01/25/2015  . Bone/cartilage disorder 01/25/2015  . Headache 11/18/2014  . Depression 11/18/2014  . Greater  tuberosity of humerus fracture 11/18/2014  . Multiple sclerosis ([place]) 11/18/2014  . Unsteady gait 11/18/2014  . Dizziness 11/18/2014  . Inconclusive mammogram 03/15/2013  . Back ache 09/06/2012  . BP (high blood pressure) 09/06/2012  . Back pain 09/06/2012  . Hypercholesterolemia 01/28/2012  . Climacteric 01/28/2012  . Routine general medical examination at a health care facility 01/28/2012  . Avitaminosis D 01/28/2012    Past Surgical History:  Procedure Laterality Date  . BACK SURGERY    . CHOLECYSTECTOMY    . COLONOSCOPY WITH PROPOFOL N/A 05/18/2017   Procedure: COLONOSCOPY WITH PROPOFOL;  Surgeon: [name], MD;  Location: [facility] ENDOSCOPY;  Service: Endoscopy;  Laterality: N/A;  . FOOT SURGERY  2015  . GALLBLADDER SURGERY  2008  . HARDWARE REMOVAL Left 02/14/2016   Procedure: LEFT FOOT REMOVAL DEEP IMPLANT;  Surgeon: [name], MD;  Location: [place];  Service: Orthopedics;  Laterality: Left;  . HERNIA REPAIR     Inguinal Hernia Repair  . SPINE SURGERY  2014    Prior to Admission medications   Medication Sig Start Date End Date Taking? Authorizing Provider  acetaminophen (TYLENOL) 325 MG tablet Take 650 mg by mouth every 4 (four) hours as needed.    [provider]  ALPRAZolam Duanne Moron) 0.5 MG tablet Take by mouth. Reported on 04/09/2016    [provider]  B Complex Vitamins (  VITAMIN B COMPLEX PO) Take by mouth.    [provider]  butalbital-acetaminophen-caffeine Emelda Brothers, ESGIC) [phone] MG tablet Take 1-2 tablets by mouth every 6 (six) hours as needed for headache. 12/21/17 12/21/18  Loney Hering, MD  Calcium-Phosphorus-Vitamin D (CITRACAL +D3 PO) Take by mouth. 2 tablets twice a day    [provider]  Cholecalciferol (D3-1000 PO) Take by mouth.    [provider]  Coenzyme Q10 (CO Q10) 100 MG CAPS Take by mouth.    [provider]  dalfampridine 10 MG TB12 Take 10 mg by mouth every 12  (twelve) hours.    [provider]  fluticasone Asencion Islam) 50 MCG/ACT nasal spray  09/07/16   [provider]  lactulose (CEPHULAC) 10 g packet Take 1 packet (10 g total) by mouth daily at 6 (six) AM. You may take up to three times daily depending on severity of constipation.  Hold for diarrhea 11/17/17   Merlyn Lot, MD  metoCLOPramide (REGLAN) 10 MG tablet Take 1 tablet (10 mg total) by mouth every 6 (six) hours as needed for nausea. 11/17/17 11/17/18  Merlyn Lot, MD  mirtazapine (REMERON) 30 MG tablet Take 35 mg by mouth at bedtime.  04/16/17   [provider]  Multiple Vitamins-Minerals (CENTRUM SILVER ULTRA WOMENS PO) Take by mouth.    [provider]  nefazodone (SERZONE) 100 MG tablet Take 200 mg by mouth at bedtime. 09/15/17   [provider]  ocrelizumab (OCREVUS) 300 MG/10ML injection Inject 20 mLs (600 mg total) into the vein every 6 (six) months. 04/22/17   Lequita Asal, MD  sertraline (ZOLOFT) 100 MG tablet Take 200 mg by mouth at bedtime.     [provider]  sodium chloride (V-R NASAL SPRAY SALINE) 0.65 % nasal spray Place into the nose.    [provider]    Allergies Asa [aspirin]; Buspirone; Levofloxacin; Lexapro [escitalopram oxalate]; Motrin [ibuprofen]; Soy allergy; Ceftin [cefuroxime axetil]; Penicillins; and Sulfa antibiotics  Family History  Problem Relation Age of Onset  . Arthritis Mother   . Hypertension Mother   . Macular degeneration Mother   . Hypertension Father   . Hyperlipidemia Father   . Heart disease Maternal Grandfather   . Diabetes Maternal Grandfather   . Kidney disease Paternal Grandmother     Social History Social History   Tobacco Use  . Smoking status: Never Smoker  . Smokeless tobacco: Never Used  Substance Use Topics  . Alcohol use: No    Alcohol/week: 0.0 oz  . Drug use: No    Review of Systems  Constitutional: No fever/chills Eyes: No visual changes. ENT:  No sore throat. Cardiovascular: Denies chest pain. Respiratory: Intermittent shortness of breath Gastrointestinal: No abdominal pain.  No nausea, no vomiting.  No diarrhea.  No constipation. Genitourinary: Negative for dysuria. Musculoskeletal: Negative for back pain. Skin: Negative for rash. Neurological: Headache Psych: Anxiety  ____________________________________________   PHYSICAL EXAM:  VITAL SIGNS: ED Triage Vitals [12/20/17 2127]  Enc Vitals Group     BP 127/81     Pulse Rate (!) 102     Resp 17     Temp 99.1 F (37.3 C)     Temp Source Oral     SpO2 96 %     Weight 169 lb (76.7 kg)     Height      Head Circumference      Peak Flow      Pain Score 5     Pain  Loc      Pain Edu?      Excl. in [place]?     Constitutional: Alert and oriented. Well appearing and in mild distress. Eyes: Conjunctivae are normal. PERRL. EOMI. Head: Atraumatic. Nose: No congestion/rhinnorhea. Mouth/Throat: Mucous membranes are moist.  Oropharynx non-erythematous. Cardiovascular: Normal rate, regular rhythm. Grossly normal heart sounds.  Good peripheral circulation. Respiratory: Normal respiratory effort.  No retractions. Lungs CTAB. Gastrointestinal: Soft and nontender. No distention.  Positive bowel sounds Musculoskeletal: No lower extremity tenderness nor edema.   Neurologic:  Normal speech and language.  Patient with some mild left-sided weakness and left facial droop which is chronic Skin:  Skin is warm, dry and intact.  Psychiatric: Mood and affect are normal.   ____________________________________________   LABS (all labs ordered are listed, but only abnormal results are displayed)  Labs Reviewed  BASIC METABOLIC PANEL - Abnormal; Notable for the following components:      Result Value   Glucose, Bld 139 (*)    All other components within normal limits  CBC  TROPONIN I   ____________________________________________  EKG  ED ECG REPORT I, Loney Hering, the  attending physician, personally viewed and interpreted this ECG.   Date: 12/20/2017  EKG Time: 2129  Rate: 95  Rhythm: normal sinus rhythm  Axis: normal  Intervals:none  ST&T Change: none  ____________________________________________  RADIOLOGY  ED MD interpretation:  CXR: NAD  CT head: No new intracranial pathology seen, mild to moderate cortical volume loss.  Official radiology report(s): Dg Chest 2 View  Result Date: 12/20/2017 CLINICAL DATA:  Hypertension and headaches EXAM: CHEST - 2 VIEW COMPARISON:  None. FINDINGS: Cardiac shadow is within normal limits. Mild aortic calcifications are seen. The lungs are clear bilaterally. Postsurgical changes in the lumbar spine are seen. Scoliosis concave to the left is noted in the lower thoracic spine. IMPRESSION: No active cardiopulmonary disease. Electronically Signed   By: Inez Catalina M.D.   On: 12/20/2017 22:51   Ct Head Wo Contrast  Result Date: 12/21/2017 CLINICAL DATA:  Acute onset of high blood pressure. Headache and dizziness. EXAM: CT HEAD WITHOUT CONTRAST TECHNIQUE: Contiguous axial images were obtained from the base of the skull through the vertex without intravenous contrast. COMPARISON:  CT of the head performed 02/24/2017, and MRI of the brain performed 03/12/2016 FINDINGS: Brain: No evidence of acute infarction, hemorrhage, hydrocephalus, extra-axial collection or mass lesion / mass effect. Prominence of the ventricles and sulci reflects mild to moderate cortical volume loss. Diffuse white matter hypoattenuation reflects the patient's multiple sclerosis, as previously noted. The brainstem and fourth ventricle are within normal limits. The basal ganglia are unremarkable in appearance. No mass effect or midline shift is seen. Vascular: No hyperdense vessel or unexpected calcification. Skull: There is no evidence of fracture; visualized osseous structures are unremarkable in appearance. Sinuses/Orbits: The visualized portions of the  orbits are within normal limits. The paranasal sinuses and mastoid air cells are well-aerated. Other: No significant soft tissue abnormalities are seen. IMPRESSION: 1. No new intracranial pathology seen on CT. 2. Mild to moderate cortical volume loss. Diffuse changes of the patient's multiple sclerosis again noted. Electronically Signed   By: Garald Balding M.D.   On: 12/21/2017 03:47    ____________________________________________   PROCEDURES  Procedure(s) performed: None  Procedures  Critical Care performed: No  ____________________________________________   INITIAL IMPRESSION / ASSESSMENT AND PLAN / ED COURSE  As part of my medical decision making, I reviewed the following data within the  electronic MEDICAL RECORD NUMBER Notes from prior ED visits and [place] Controlled Substance Database   This is a 67 year old female who comes into the hospital today with some headache and elevated blood pressure.  By the time the patient came into the emergency department room the patient's blood pressure was improved to the 140s over 90s.  The patient was very adamant about needing advice as to whether she should go back down on her Lexapro as she feels that these are causing her symptoms.  I did inform the patient that 1 of the side effects of the Lexapro could be headaches but she would need to discuss coming off of the medication or going on a new medication with her psychiatrist.  The patient did seem very frustrated since I would not tell her to stop her medication.  I will perform a CT scan of the patient's head and we did check a CBC, BMP, troponin and chest x-ray on the patient which was all unremarkable.  The patient will receive a dose of magnesium sulfate and a liter of normal saline and Fioricet for her headache.  She will be reassessed.  The patient did inform the nurse that she wanted to get her thyroid checked.  I did have the nurse inform the patient that that would be something to be done  appropriately at her primary care physician's office and she was not having any thyroid related complaints.     The patient's headache is improved after the medication.  She will be discharged home.  She again does do well on her Lexapro and how to wean off of it but again I did refer her back to her primary care physician to have them wean her off of the medication and determine if she needs to be on another antidepressant.  The patient otherwise has no other complaints.  She states that she does feel some pulling in her leg but it is likely from being flat on the stretcher.  The patient will be discharged to follow-up.  She has no other complaints. ____________________________________________   FINAL CLINICAL IMPRESSION(S) / ED DIAGNOSES  Final diagnoses:  Hypertension, unspecified type  Acute nonintractable headache, unspecified headache type  Medication side effect     ED Discharge Orders        Ordered    butalbital-acetaminophen-caffeine (FIORICET, ESGIC) 50-325-40 MG tablet  Every 6 hours PRN     12/21/17 0416       Note:  This document was prepared using Dragon voice recognition software and may include unintentional dictation errors.    Loney Hering, MD 12/21/17 [phone]

## 2017-12-21 NOTE — Discharge Instructions (Signed)
Please follow-up with your primary care physician to determine the appropriate way to taper off of your anti-depressant.  Please continue taking the headache medicine as needed for any headaches.  Please ensure that you are staying hydrated.

## 2018-01-01 ENCOUNTER — Telehealth: Payer: Self-pay

## 2018-01-01 ENCOUNTER — Telehealth: Payer: Self-pay | Admitting: Internal Medicine

## 2018-01-01 ENCOUNTER — Encounter: Payer: Self-pay | Admitting: *Deleted

## 2018-01-01 NOTE — Telephone Encounter (Signed)
Please advise 

## 2018-01-01 NOTE — Telephone Encounter (Signed)
The change in medication possibly could have created some increased anxiety, etc which could elevate blood pressure.  I can see her next week if she feels she needs to be seen.  If anything acute over weekend - to acute care.

## 2018-01-01 NOTE — Telephone Encounter (Signed)
Patient notified

## 2018-01-01 NOTE — Telephone Encounter (Addendum)
Patient has stopped Lexapro recently and started Remeron and this was the first and wondered if this had anything to do with change in BP which this afternoon is 159/84 pulse of 99. Dr. Toy Care patient psychiatrist prescribes the Remeron. Patient BP spike s were before she started the remeron but started during the time she was off Lexapro.

## 2018-01-01 NOTE — Telephone Encounter (Signed)
If possible, I would like to keep the appt on Wednesday.  Ok to check labs.  I will place the order at her visit and will draw after I see her.  Note states head feeling better.  How is blood pressure running now?   I would hold on refilling fioricet.  Can use tylenol arthritis for pain.

## 2018-01-01 NOTE — Telephone Encounter (Signed)
Patient is calling because she would like to know if Dr.Scott would be able to give her a refill on her Butalbital-acetaminophen-caffeine 50-325-40 mg medication that she received from the hospital. Stated that she is still having lower back pain and would like the medication to be sent to the [facility] in [place] [address] [phone]. She can be reached at [phone] or [phone]

## 2018-01-01 NOTE — Telephone Encounter (Signed)
Pt called back and told her about the appt on 01/06/18. Pt wants to also have lab work done at the time of this appt. And will come fasting so if Dr. Nicki Reaper is ok with ordering labs she will be ready to have them done.

## 2018-01-01 NOTE — Telephone Encounter (Signed)
Pt would also like to know if she could be seen Monday or Tuesday so she doesn't have to reschedule an appt she already has on Wednesday.

## 2018-01-01 NOTE — Telephone Encounter (Addendum)
Patient needs ED follow up hypertension. Scheduled for 01/06/18, patient went to ER for severe lower back pain. and headache when she went to ER they advised her it was hypertension. The headache is better but the back pain has returned  Patient stated they gave her fioricet patient ask if she could be given fioricet until appointment?

## 2018-01-01 NOTE — Telephone Encounter (Signed)
This encounter was created in error - please disregard.

## 2018-01-01 NOTE — Telephone Encounter (Signed)
Copied from [place]. Topic: Appointment Scheduling - Prior Auth Required for Appointment >> Jan 01, 2018 10:19 AM Ahmed Prima L wrote: No appointment has been scheduled. Patient is requesting a ER follow up from Sunday 3/17, Per scheduling protocol, this appointment requires a prior authorization prior to scheduling. Please call pt at [phone]  Route to department's PEC pool.

## 2018-01-01 NOTE — Telephone Encounter (Signed)
Copied from [place]. Topic: Appointment Scheduling - Prior Auth Required for Appointment >> Jan 01, 2018 10:19 AM Ahmed Prima L wrote: No appointment has been scheduled. Patient is requesting a ER follow up from Sunday 3/17, Per scheduling protocol, this appointment requires a prior authorization prior to scheduling. Please call pt at [phone]  Route to department's PEC pool.

## 2018-01-04 NOTE — Telephone Encounter (Signed)
See other phone note

## 2018-01-06 ENCOUNTER — Telehealth: Payer: Self-pay

## 2018-01-06 ENCOUNTER — Ambulatory Visit: Payer: Medicare Other | Admitting: Internal Medicine

## 2018-01-06 VITALS — BP 140/90 | HR 98 | Temp 98.3°F | Resp 18 | Wt 167.4 lb

## 2018-01-06 DIAGNOSIS — R51 Headache: Secondary | ICD-10-CM | POA: Diagnosis not present

## 2018-01-06 DIAGNOSIS — R5383 Other fatigue: Secondary | ICD-10-CM | POA: Diagnosis not present

## 2018-01-06 DIAGNOSIS — G35 Multiple sclerosis: Secondary | ICD-10-CM | POA: Diagnosis not present

## 2018-01-06 DIAGNOSIS — F330 Major depressive disorder, recurrent, mild: Secondary | ICD-10-CM

## 2018-01-06 DIAGNOSIS — I10 Essential (primary) hypertension: Secondary | ICD-10-CM | POA: Diagnosis not present

## 2018-01-06 DIAGNOSIS — E7800 Pure hypercholesterolemia, unspecified: Secondary | ICD-10-CM

## 2018-01-06 DIAGNOSIS — R519 Headache, unspecified: Secondary | ICD-10-CM

## 2018-01-06 LAB — SEDIMENTATION RATE: SED RATE: 8 mm/h (ref 0–30)

## 2018-01-06 LAB — TSH: TSH: 1.8 u[IU]/mL (ref 0.35–4.50)

## 2018-01-06 NOTE — Telephone Encounter (Signed)
Patient had TSH drawn

## 2018-01-06 NOTE — Progress Notes (Signed)
Patient ID: STAPHANIE HARBISON, female   DOB: 1951/09/14, 67 y.o.   MRN: [id number]   Subjective:    Patient ID: [name], female    DOB: 01-Dec-1950, 67 y.o.   MRN: [id number]  HPI  Patient here for ER follow up.  She is accompanied by her husband.  History obtained from both of them.  She was seen in the ER 12/21/17 with c/o elevated blood pressure and headache.  Was concerned the lexapro was contributing to her problems.  Note reviewed.  Blood pressure better on checks in ER.  She was given magnesium, hydrated and discharged home with fioricet.  CT negatvie.  She stopped lexapro and was off for 15 days.  Anxiety worsened.  Had some persistent headache.  Appears to be more related to increased muscle tension in her neck.  Was started on remeron by her psychiatrist.  Has been on for five days.  Starting to feel better.  Anxiety is improving.  Headache not as severe.  Saw ENT yesterday.  Had cerumen impaction.  Was placed on astelin.  No significant sinus pressure now.  Also saw her dentist yesterday.  She feels a lot of her issues are related to increased stress and anxiety.  She has been seeing Dr Gorden Harms for her MS.  He is cutting back.  Plans to start seeing Dr Kerman Passey.  Has appt next week.  Had lots of questions about her medication and her anxiety.  Constipation resolved.     Past Medical History:  Diagnosis Date  . Allergy   . Anxiety   . Aspiration pneumonia ([place])   . Depression   . Frequent headaches    H/O  . GERD (gastroesophageal reflux disease)   . History of chicken pox   . History of colon polyps   . Hx of migraines   . Multiple sclerosis ([place])   . PONV (postoperative nausea and vomiting)    Past Surgical History:  Procedure Laterality Date  . BACK SURGERY    . CHOLECYSTECTOMY    . COLONOSCOPY WITH PROPOFOL N/A 05/18/2017   Procedure: COLONOSCOPY WITH PROPOFOL;  Surgeon: [name], MD;  [facility] END[facility]COPY;  Service: Endoscopy;  Laterality: N/A;  . FOOT  SURGERY  2015  . GALLBLADDER SURGERY  2008  . HARDWARE REMOVAL Left 02/14/2016   Procedure: LEFT FOOT REMOVAL DEEP IMPLANT;  Surgeon: [name], MD;  Location: [facility];  Service: Orthopedics;  Laterality: Left;  . HERNIA REPAIR     Inguinal Hernia Repair  . SPINE SURGERY  2014   Family History  Problem Relation Age of Onset  . Arthritis Mother   . Hypertension Mother   . Macular degeneration Mother   . Hypertension Father   . Hyperlipidemia Father   . Heart disease Maternal Grandfather   . Diabetes Maternal Grandfather   . Kidney disease Paternal Grandmother    Social History   Socioeconomic History  . Marital status: Married    Spouse name: Not on file  . Number of children: Not on file  . Years of education: Not on file  . Highest education level: Not on file  Occupational History  . Not on file  Social Needs  . Financial resource strain: Not on file  . Food insecurity:    Worry: Not on file    Inability: Not on file  . Transportation needs:    Medical: Not on file    Non-medical: Not on file  Tobacco Use  .  Smoking status: Never Smoker  . Smokeless tobacco: Never Used  Substance and Sexual Activity  . Alcohol use: No    Alcohol/week: 0.0 oz  . Drug use: No  . Sexual activity: Not Currently  Lifestyle  . Physical activity:    Days per week: Not on file    Minutes per session: Not on file  . Stress: Not on file  Relationships  . Social connections:    Talks on phone: Not on file    Gets together: Not on file    Attends religious service: Not on file    Active member of club or organization: Not on file    Attends meetings of clubs or organizations: Not on file    Relationship status: Not on file  Other Topics Concern  . Not on file  Social History Narrative  . Not on file    Outpatient Encounter Medications as of 01/06/2018  Medication Sig  . acetaminophen (TYLENOL) 325 MG tablet Take 650 mg by mouth every 4 (four) hours as needed.    . ALPRAZolam (XANAX) 0.5 MG tablet Take by mouth. Reported on 04/09/2016  . B Complex Vitamins (VITAMIN B COMPLEX PO) Take by mouth.  . Calcium-Phosphorus-Vitamin D (CITRACAL +D3 PO) Take by mouth. 2 tablets twice a day  . Cholecalciferol (D3-1000 PO) Take by mouth.  . Coenzyme Q10 (CO Q10) 100 MG CAPS Take by mouth.  . dalfampridine 10 MG TB12 Take 10 mg by mouth every 12 (twelve) hours.  . fluticasone (FLONASE) 50 MCG/ACT nasal spray   . mirtazapine (REMERON) 30 MG tablet Take 35 mg by mouth at bedtime.   . Multiple Vitamins-Minerals (CENTRUM SILVER ULTRA WOMENS PO) Take by mouth.  Marland Kitchen ocrelizumab (OCREVUS) 300 MG/10ML injection Inject 20 mLs (600 mg total) into the vein every 6 (six) months.  . sodium chloride (V-R NASAL SPRAY SALINE) 0.65 % nasal spray Place into the nose.  . [DISCONTINUED] lactulose (CEPHULAC) 10 g packet Take 1 packet (10 g total) by mouth daily at 6 (six) AM. You may take up to three times daily depending on severity of constipation.  Hold for diarrhea  . azelastine (ASTELIN) 0.1 % nasal spray   . butalbital-acetaminophen-caffeine (FIORICET, ESGIC) 50-325-40 MG tablet Take 1-2 tablets by mouth every 6 (six) hours as needed for headache.  . magnesium oxide (MAG-OX) 400 MG tablet Take 1 tablet (400 mg total) by mouth daily.  . [DISCONTINUED] metoCLOPramide (REGLAN) 10 MG tablet Take 1 tablet (10 mg total) by mouth every 6 (six) hours as needed for nausea. (Patient not taking: Reported on 01/06/2018)  . [DISCONTINUED] nefazodone (SERZONE) 100 MG tablet Take 200 mg by mouth at bedtime.  . [DISCONTINUED] sertraline (ZOLOFT) 100 MG tablet Take 200 mg by mouth at bedtime.    Facility-Administered Encounter Medications as of 01/06/2018  Medication  . 0.9 %  sodium chloride infusion  . acetaminophen (TYLENOL) tablet 650 mg    Review of Systems  Constitutional: Negative for appetite change and unexpected weight change.  HENT: Negative for congestion and sinus pressure.    Respiratory: Negative for cough, chest tightness and shortness of breath.   Cardiovascular: Negative for chest pain, palpitations and leg swelling.  Gastrointestinal: Negative for abdominal pain, diarrhea, nausea and vomiting.  Genitourinary: Negative for difficulty urinating and dysuria.  Musculoskeletal: Positive for neck stiffness. Negative for joint swelling and myalgias.  Skin: Negative for color change and rash.  Neurological: Positive for headaches. Negative for dizziness and light-headedness.  Psychiatric/Behavioral:  Increased anxiety and stress as outlined.  Seeing psychiatry.  Medication being adjusted.         Objective:     Blood pressure rechecked by me:  140/88-90  Physical Exam  Constitutional: She appears well-developed and well-nourished. No distress.  HENT:  Nose: Nose normal.  Mouth/Throat: Oropharynx is clear and moist.  Neck: Neck supple. No thyromegaly present.  Cardiovascular: Normal rate and regular rhythm.  Pulmonary/Chest: Breath sounds normal. No respiratory distress. She has no wheezes.  Abdominal: Soft. Bowel sounds are normal. There is no tenderness.  Musculoskeletal: She exhibits no edema or tenderness.  No significant pain to palpation over the back.  Some minimal pulling sensation with rotation of head from right to left.  Appears to be more msk.    Lymphadenopathy:    She has no cervical adenopathy.  Skin: No rash noted. No erythema.  Psychiatric: She has a normal mood and affect. Her behavior is normal.    BP 140/90   Pulse 98   Temp 98.3 F (36.8 C) (Oral)   Resp 18   Wt 167 lb 6.4 oz (75.9 kg)   SpO2 99%   BMI 29.65 kg/m  Wt Readings from Last 3 Encounters:  01/06/18 167 lb 6.4 oz (75.9 kg)  12/20/17 169 lb (76.7 kg)  11/17/17 169 lb (76.7 kg)     Lab Results  Component Value Date   WBC 9.8 12/20/2017   HGB 14.5 12/20/2017   HCT 42.9 12/20/2017   PLT 350 12/20/2017   GLUCOSE 139 (H) 12/20/2017   CHOL 254 (H)  03/10/2017   TRIG 201.0 (H) 03/10/2017   HDL 70.90 03/10/2017   LDLDIRECT 138.0 03/10/2017   LDLCALC 148 (H) 07/15/2016   ALT 24 03/10/2017   AST 25 03/10/2017   NA 136 12/20/2017   K 4.1 12/20/2017   CL 103 12/20/2017   CREATININE 0.57 12/20/2017   BUN 7 12/20/2017   CO2 23 12/20/2017   TSH 1.80 01/06/2018   HGBA1C 5.4 03/10/2017    Ct Head Wo Contrast  Result Date: 12/21/2017 CLINICAL DATA:  Acute onset of high blood pressure. Headache and dizziness. EXAM: CT HEAD WITHOUT CONTRAST TECHNIQUE: Contiguous axial images were obtained from the base of the skull through the vertex without intravenous contrast. COMPARISON:  CT of the head performed 02/24/2017, and MRI of the brain performed 03/12/2016 FINDINGS: Brain: No evidence of acute infarction, hemorrhage, hydrocephalus, extra-axial collection or mass lesion / mass effect. Prominence of the ventricles and sulci reflects mild to moderate cortical volume loss. Diffuse white matter hypoattenuation reflects the patient's multiple sclerosis, as previously noted. The brainstem and fourth ventricle are within normal limits. The basal ganglia are unremarkable in appearance. No mass effect or midline shift is seen. Vascular: No hyperdense vessel or unexpected calcification. Skull: There is no evidence of fracture; visualized osseous structures are unremarkable in appearance. Sinuses/Orbits: The visualized portions of the orbits are within normal limits. The paranasal sinuses and mastoid air cells are well-aerated. Other: No significant soft tissue abnormalities are seen. IMPRESSION: 1. No new intracranial pathology seen on CT. 2. Mild to moderate cortical volume loss. Diffuse changes of the patient's multiple sclerosis again noted. Electronically Signed   By: Garald Balding M.D.   On: 12/21/2017 03:47       Assessment & Plan:   Problem List Items Addressed This Visit    BP (high blood pressure)    Discussed at length with her today.  Discussed  treatment.  Does not have  a history of high blood pressure.  With increased stress and anxiety recently. Just started on remeron and this is helping.  Will hold on starting blood pressure medication at this time.  Follow pressures closely.  Treat anxiety and headache.  Get her back in soon to reassess.  Explained if persistent elevation, will need medication.        Depression    Followed by [name].  Has tried multiple medications.  On remeron now.  Just started, but feels she is starting to feel better.  Husband agrees.  Continue f/u with [name].        Headache - Primary    Headaches as outlined.  Is better today.  Recently evaluated in ER as outlined.  CT negative.  She feels related to increased stress.  Treat stress.  Hold on further scanning.  Continue remeron.  Plans to discuss with [name] regarding increasing the dose.  Has tizanidine if needed.  Start magnesium.  Check esr.  Plans to d/w neurology next week.        Relevant Orders   Sedimentation rate (Completed)   Hypercholesterolemia    Low cholesterol diet and exercise.  Follow lipid panel.       Multiple sclerosis ([place])    Planning to see Dr Kerman Passey next week.         Other Visit Diagnoses    Other fatigue       Relevant Orders   TSH (Completed)      I spent 45 minutes with the patient and more than 50% of the time was spent in consultation regarding the above.  Time spent discussing her current issues and ER evaluation.  Time also spent discussing plans for further treatment and evaluation.     Einar Pheasant, MD

## 2018-01-06 NOTE — Telephone Encounter (Signed)
Copied from [place] [id number]. Topic: Inquiry >> Jan 05, 2018 12:46 PM Conception Chancy, NT wrote: Patient is calling and states she would like to get a thyroid panel at her appt tomorrow 01/06/18

## 2018-01-07 ENCOUNTER — Telehealth: Payer: Self-pay | Admitting: Internal Medicine

## 2018-01-07 MED ORDER — MAGNESIUM OXIDE 400 MG PO TABS: 400.0000 mg | ORAL_TABLET | Freq: Every day | ORAL | 1 refills | Status: DC

## 2018-01-07 NOTE — Telephone Encounter (Signed)
Patient notified and voiced understanding.

## 2018-01-07 NOTE — Telephone Encounter (Signed)
Sent rx for magoxide 400mg .  Sorry for the inconvenience.

## 2018-01-07 NOTE — Telephone Encounter (Signed)
I do not see magnesium in char for patient or mentioned in notes from visit.

## 2018-01-07 NOTE — Telephone Encounter (Signed)
Copied from [place]. Topic: Quick Communication - See Telephone Encounter >> Jan 07, 2018 12:55 PM Lolita Rieger, RMA wrote: CRM for notification. See Telephone encounter for: 01/07/18.pt called and stated that she was told during her visit that magnesium would be sent to pharmacy pt stated that it is not there

## 2018-01-09 ENCOUNTER — Encounter: Payer: Self-pay | Admitting: Internal Medicine

## 2018-01-09 NOTE — Assessment & Plan Note (Addendum)
Headaches as outlined.  Is better today.  Recently evaluated in ER as outlined.  CT negative.  She feels related to increased stress.  Treat stress.  Hold on further scanning.  Continue remeron.  Plans to discuss with [name] regarding increasing the dose.  Has tizanidine if needed.  Start magnesium.  Check esr.  Plans to d/w neurology next week.

## 2018-01-09 NOTE — Assessment & Plan Note (Signed)
Discussed at length with her today.  Discussed treatment.  Does not have a history of high blood pressure.  With increased stress and anxiety recently. Just started on remeron and this is helping.  Will hold on starting blood pressure medication at this time.  Follow pressures closely.  Treat anxiety and headache.  Get her back in soon to reassess.  Explained if persistent elevation, will need medication.

## 2018-01-09 NOTE — Assessment & Plan Note (Signed)
Planning to see Dr Kerman Passey next week.

## 2018-01-09 NOTE — Assessment & Plan Note (Signed)
Low cholesterol diet and exercise.  Follow lipid panel.   

## 2018-01-09 NOTE — Assessment & Plan Note (Signed)
Followed by [name].  Has tried multiple medications.  On remeron now.  Just started, but feels she is starting to feel better.  Husband agrees.  Continue f/u with [name].

## 2018-01-12 ENCOUNTER — Ambulatory Visit: Payer: Medicare Other | Admitting: Neurology

## 2018-01-22 ENCOUNTER — Ambulatory Visit: Payer: Self-pay | Admitting: *Deleted

## 2018-01-22 ENCOUNTER — Telehealth: Payer: Self-pay | Admitting: Internal Medicine

## 2018-01-22 NOTE — Telephone Encounter (Signed)
Patient stated that she took Remeron last year and her legs and feet were swollen. She is trying the brand name Remeron this year and feet/legs are starting to swell again and becoming painful to walk. She does not have any redness or warm to touch. No other acute symptoms. Requesting a fluid pill to see if that would help.

## 2018-01-22 NOTE — Telephone Encounter (Signed)
Would hold on fluid pill without evaluation.  Are legs better in the am?  Needs to elevate legs when sitting.  Make sure she is not eating a lot of high sodium foods.  If persistent problem, needs to be evaluation.  I can see her Monday at 9:30.

## 2018-01-22 NOTE — Telephone Encounter (Signed)
Copied from [place] [phone]. Topic: General - Other >> Jan 22, 2018  8:35 AM Yvette Rack wrote: Reason for CRM: patient stating that she need a low dose fluid pill because of the medicine Remeron she states that it makes her feet swell pt would like a call back from [place]

## 2018-01-22 NOTE — Telephone Encounter (Signed)
Opened in error

## 2018-01-22 NOTE — Telephone Encounter (Signed)
Patient stated she cannot come on Monday. She sees neurology. Patient stated she will continue to have her legs elevated and avoid high sodium foods.

## 2018-01-26 ENCOUNTER — Ambulatory Visit: Payer: Medicare Other | Admitting: Neurology

## 2018-02-03 ENCOUNTER — Ambulatory Visit (INDEPENDENT_AMBULATORY_CARE_PROVIDER_SITE_OTHER): Payer: Medicare Other | Admitting: Internal Medicine

## 2018-02-03 ENCOUNTER — Encounter: Payer: Self-pay | Admitting: Internal Medicine

## 2018-02-03 DIAGNOSIS — E7800 Pure hypercholesterolemia, unspecified: Secondary | ICD-10-CM

## 2018-02-03 DIAGNOSIS — G35 Multiple sclerosis: Secondary | ICD-10-CM | POA: Diagnosis not present

## 2018-02-03 DIAGNOSIS — M545 Low back pain, unspecified: Secondary | ICD-10-CM

## 2018-02-03 DIAGNOSIS — R600 Localized edema: Secondary | ICD-10-CM | POA: Diagnosis not present

## 2018-02-03 DIAGNOSIS — F418 Other specified anxiety disorders: Secondary | ICD-10-CM

## 2018-02-03 MED ORDER — FLUTICASONE PROPIONATE 50 MCG/ACT NA SUSP: 2.0000 | Freq: Every day | NASAL | 4 refills | Status: DC

## 2018-02-03 NOTE — Progress Notes (Signed)
Patient ID: Carly [name], female   DOB: Jun 14, 1951, 67 y.o.   MRN: [id number]   Subjective:    Patient ID: Carly [name], female    DOB: 19-Mar-1951, 67 y.o.   MRN: [id number]  HPI  Patient here for a scheduled follow up.  She is accompanied by her husband.  History obtained from both of them.  Has a history of anxiety and has been having problems with increased anxiety recently.  Sees psychiatry.  On remeron.  Had problems with increasing the dose of remeron.  Back on 15mg  now.  Also recently started zoloft.  On 50mg  q day.  Husband states that she is some better.  She feels she needs to adjust medication.  Has f/u with neurology tomorrow.  Seeing Dr Kerman Passey for the first time.  He will follow her MS.  Has questions about her medication that she plans to discuss with him.  No chest pain.  Breathing stable.  No headache.  Eating.  No abdominal pain.  Bowels moving.  Blood pressure doing better.  Swelling in lower extremities - better.  Some increased back pain.  Flares at times.  Takes zanaflex prn.  Request refill.     Past Medical History:  Diagnosis Date  . Allergy   . Anxiety   . Aspiration pneumonia ([place])   . Depression   . Frequent headaches    H/O  . GERD (gastroesophageal reflux disease)   . History of chicken pox   . History of colon polyps   . Hx of migraines   . Multiple sclerosis ([place])   . PONV (postoperative nausea and vomiting)    Past Surgical History:  Procedure Laterality Date  . BACK SURGERY    . CHOLECYSTECTOMY    . COLONOSCOPY WITH PROPOFOL N/A 05/18/2017   Procedure: COLONOSCOPY WITH PROPOFOL;  Surgeon: [name], MD;  Location: [facility] ENDOSCOPY;  Service: Endoscopy;  Laterality: N/A;  . FOOT SURGERY  2015  . GALLBLADDER SURGERY  2008  . HARDWARE REMOVAL Left 02/14/2016   Procedure: LEFT FOOT REMOVAL DEEP IMPLANT;  Surgeon: [name], MD;  Location: ;  Service: Orthopedics;  Laterality: Left;  . HERNIA REPAIR     Inguinal  Hernia Repair  . SPINE SURGERY  2014   Family History  Problem Relation Age of Onset  . Arthritis Mother   . Hypertension Mother   . Macular degeneration Mother   . Hypertension Father   . Hyperlipidemia Father   . Heart disease Maternal Grandfather   . Diabetes Maternal Grandfather   . Kidney disease Paternal Grandmother    Social History   Socioeconomic History  . Marital status: Married    Spouse name: Not on file  . Number of children: Not on file  . Years of education: Not on file  . Highest education level: Not on file  Occupational History  . Not on file  Social Needs  . Financial resource strain: Not on file  . Food insecurity:    Worry: Not on file    Inability: Not on file  . Transportation needs:    Medical: Not on file    Non-medical: Not on file  Tobacco Use  . Smoking status: Never Smoker  . Smokeless tobacco: Never Used  Substance and Sexual Activity  . Alcohol use: No    Alcohol/week: 0.0 oz  . Drug use: No  . Sexual activity: Not Currently  Lifestyle  . Physical activity:    Days per  week: Not on file    Minutes per session: Not on file  . Stress: Not on file  Relationships  . Social connections:    Talks on phone: Not on file    Gets together: Not on file    Attends religious service: Not on file    Active member of club or organization: Not on file    Attends meetings of clubs or organizations: Not on file    Relationship status: Not on file  Other Topics Concern  . Not on file  Social History Narrative  . Not on file    Outpatient Encounter Medications as of 02/03/2018  Medication Sig  . acetaminophen (TYLENOL) 325 MG tablet Take 650 mg by mouth every 4 (four) hours as needed.  . ALPRAZolam (XANAX) 0.5 MG tablet Take by mouth. Reported on 04/09/2016  . azelastine (ASTELIN) 0.1 % nasal spray   . B Complex Vitamins (VITAMIN B COMPLEX PO) Take by mouth.  . butalbital-acetaminophen-caffeine (FIORICET, ESGIC) 50-325-40 MG tablet Take 1-2  tablets by mouth every 6 (six) hours as needed for headache. (Patient not taking: Reported on 02/04/2018)  . Calcium-Phosphorus-Vitamin D (CITRACAL +D3 PO) Take by mouth. 2 tablets twice a day  . Cholecalciferol (D3-1000 PO) Take by mouth.  . Coenzyme Q10 (CO Q10) 100 MG CAPS Take by mouth.  . dalfampridine 10 MG TB12 Take 10 mg by mouth every 12 (twelve) hours.  . fluticasone (FLONASE) 50 MCG/ACT nasal spray Place 2 sprays into both nostrils daily.  . magnesium oxide (MAG-OX) 400 MG tablet Take 1 tablet (400 mg total) by mouth daily.  . mirtazapine (REMERON) 30 MG tablet Take 35 mg by mouth at bedtime.   . Multiple Vitamins-Minerals (CENTRUM SILVER ULTRA WOMENS PO) Take by mouth.  Marland Kitchen ocrelizumab (OCREVUS) 300 MG/10ML injection Inject 20 mLs (600 mg total) into the vein every 6 (six) months.  . REMERON 15 MG tablet   . sertraline (ZOLOFT) 50 MG tablet Take 50 mg by mouth every morning.  . sodium chloride (V-R NASAL SPRAY SALINE) 0.65 % nasal spray Place into the nose.  . [DISCONTINUED] fluticasone (FLONASE) 50 MCG/ACT nasal spray    Facility-Administered Encounter Medications as of 02/03/2018  Medication  . 0.9 %  sodium chloride infusion  . acetaminophen (TYLENOL) tablet 650 mg    Review of Systems  Constitutional: Negative for appetite change and unexpected weight change.  HENT: Negative for congestion and sneezing.   Respiratory: Negative for cough, chest tightness and shortness of breath.   Cardiovascular: Negative for chest pain and palpitations.       Leg swelling improved.    Gastrointestinal: Negative for abdominal pain, diarrhea, nausea and vomiting.       Bowels moving better.    Genitourinary: Negative for difficulty urinating and dysuria.  Musculoskeletal: Positive for back pain. Negative for joint swelling.  Skin: Negative for color change and rash.  Neurological: Negative for dizziness, light-headedness and headaches.  Psychiatric/Behavioral:       Increased anxiety.   Discussed at length with her today.  Discussed medications.  Discussed holding on making changes in her medication, especially given just recently started zoloft.         Objective:     Blood pressure rechecked by me:  128/84  Physical Exam  Constitutional: She appears well-developed and well-nourished. No distress.  HENT:  Nose: Nose normal.  Mouth/Throat: Oropharynx is clear and moist.  Neck: Neck supple. No thyromegaly present.  Cardiovascular: Normal rate and regular rhythm.  Pulmonary/Chest: Breath sounds normal. No respiratory distress. She has no wheezes.  Abdominal: Soft. Bowel sounds are normal. There is no tenderness.  Musculoskeletal: She exhibits no tenderness.  Improved lower extremity swelling.  Minimal pedal edema.  No erythema.    Lymphadenopathy:    She has no cervical adenopathy.  Skin: No rash noted. No erythema.  Psychiatric: Her behavior is normal.    BP 128/84   Pulse 95   Temp 98 F (36.7 C) (Oral)   Resp 18   Wt 170 lb (77.1 kg)   SpO2 96%   BMI 30.11 kg/m  Wt Readings from Last 3 Encounters:  02/04/18 170 lb (77.1 kg)  02/03/18 170 lb (77.1 kg)  01/06/18 167 lb 6.4 oz (75.9 kg)     Lab Results  Component Value Date   WBC 9.8 12/20/2017   HGB 14.5 12/20/2017   HCT 42.9 12/20/2017   PLT 350 12/20/2017   GLUCOSE 139 (H) 12/20/2017   CHOL 254 (H) 03/10/2017   TRIG 201.0 (H) 03/10/2017   HDL 70.90 03/10/2017   LDLDIRECT 138.0 03/10/2017   LDLCALC 148 (H) 07/15/2016   ALT 24 03/10/2017   AST 25 03/10/2017   NA 136 12/20/2017   K 4.1 12/20/2017   CL 103 12/20/2017   CREATININE 0.57 12/20/2017   BUN 7 12/20/2017   CO2 23 12/20/2017   TSH 1.80 01/06/2018   HGBA1C 5.4 03/10/2017    Ct Head Wo Contrast  Result Date: 12/21/2017 CLINICAL DATA:  Acute onset of high blood pressure. Headache and dizziness. EXAM: CT HEAD WITHOUT CONTRAST TECHNIQUE: Contiguous axial images were obtained from the base of the skull through the vertex without  intravenous contrast. COMPARISON:  CT of the head performed 02/24/2017, and MRI of the brain performed 03/12/2016 FINDINGS: Brain: No evidence of acute infarction, hemorrhage, hydrocephalus, extra-axial collection or mass lesion / mass effect. Prominence of the ventricles and sulci reflects mild to moderate cortical volume loss. Diffuse white matter hypoattenuation reflects the patient's multiple sclerosis, as previously noted. The brainstem and fourth ventricle are within normal limits. The basal ganglia are unremarkable in appearance. No mass effect or midline shift is seen. Vascular: No hyperdense vessel or unexpected calcification. Skull: There is no evidence of fracture; visualized osseous structures are unremarkable in appearance. Sinuses/Orbits: The visualized portions of the orbits are within normal limits. The paranasal sinuses and mastoid air cells are well-aerated. Other: No significant soft tissue abnormalities are seen. IMPRESSION: 1. No new intracranial pathology seen on CT. 2. Mild to moderate cortical volume loss. Diffuse changes of the patient's multiple sclerosis again noted. Electronically Signed   By: Garald Balding M.D.   On: 12/21/2017 03:47       Assessment & Plan:   Problem List Items Addressed This Visit    Back pain    Back pain as outlined.  Flares intermittently.  Refilled zanaflex.  Helps.  Rarely takes.  Follow.       Depression with anxiety    Followed by [name].  Just started zoloft.  On remeron.  Discussed holding on making changes in medication since just started.  Follow. Keep f/u with psychiatry.        Relevant Medications   REMERON 15 MG tablet   sertraline (ZOLOFT) 50 MG tablet   Hypercholesterolemia    Low cholesterol diet and exercise.  Follow lipid panel.       Lower extremity edema    Improved.  Discussed recommendation for compression hose.  Multiple sclerosis ([facility])    Seeing Dr Kerman Passey tomorrow.  Plans to discuss medications.            Einar Pheasant, MD

## 2018-02-04 ENCOUNTER — Other Ambulatory Visit: Payer: Self-pay

## 2018-02-04 ENCOUNTER — Encounter: Payer: Self-pay | Admitting: Neurology

## 2018-02-04 ENCOUNTER — Ambulatory Visit: Payer: Medicare Other | Admitting: Neurology

## 2018-02-04 VITALS — BP 146/93 | HR 97 | Resp 16 | Ht 63.0 in | Wt 170.0 lb

## 2018-02-04 DIAGNOSIS — F325 Major depressive disorder, single episode, in full remission: Secondary | ICD-10-CM | POA: Insufficient documentation

## 2018-02-04 DIAGNOSIS — F418 Other specified anxiety disorders: Secondary | ICD-10-CM | POA: Diagnosis not present

## 2018-02-04 DIAGNOSIS — E7529 Other sphingolipidosis: Secondary | ICD-10-CM | POA: Diagnosis not present

## 2018-02-04 DIAGNOSIS — G35 Multiple sclerosis: Secondary | ICD-10-CM | POA: Diagnosis not present

## 2018-02-04 DIAGNOSIS — R2681 Unsteadiness on feet: Secondary | ICD-10-CM | POA: Diagnosis not present

## 2018-02-04 DIAGNOSIS — R4689 Other symptoms and signs involving appearance and behavior: Secondary | ICD-10-CM | POA: Diagnosis not present

## 2018-02-04 DIAGNOSIS — R4189 Other symptoms and signs involving cognitive functions and awareness: Secondary | ICD-10-CM | POA: Diagnosis not present

## 2018-02-04 DIAGNOSIS — F419 Anxiety disorder, unspecified: Secondary | ICD-10-CM | POA: Insufficient documentation

## 2018-02-04 MED ORDER — LAMOTRIGINE 100 MG PO TABS: 100.0000 mg | ORAL_TABLET | Freq: Two times a day (BID) | ORAL | 5 refills | Status: DC

## 2018-02-04 MED ORDER — ARIPIPRAZOLE 5 MG PO TABS: 5.0000 mg | ORAL_TABLET | Freq: Every day | ORAL | 5 refills | Status: DC

## 2018-02-04 NOTE — Progress Notes (Signed)
[facility]  PATIENT: Carly [name] DOB: 07-16-1951  REFERRING DOCTOR OR PCP:  Einar Pheasant SOURCE: Patient, husband, records from primary care and previous neurologist, MRI reports and MRI images on PACS.  _________________________________   HISTORICAL  CHIEF COMPLAINT:  Chief Complaint  Patient presents with  . Multiple Sclerosis    Sts. dx. with MS around 2012.   Sts. MS was an incidental finding, after MRI brain, c-spine to investigate scoliosis/back pain.  No LP done. Sts. she was initially seen by Dr. Pamalee Leyden and dx. with PPMS. so not put on any medication.  She saw Dr. Jacqulynn Cadet a yr. later for a 2nd opinion and sts. 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 and tolerated it well, with no progression of sx., and no new MS lesions that she is aware of.  When Ocrevus came to market, sts. Dr. Jacqulynn Cadet switched her. Last Ocrevus i  . Gait Disturbance    infusion was 10/15/17, at [facility] in [place].  Sts. she feels anxiety and depression have been worse since switching from [place] to [place]. Sts. she intermittently uses a cane or rolling walker. Sts. she has had more lower back and right leg pain since sitting at a dinner table for 2 yrs. with legs bent.  When she got up from the table, her right leg was numb and she had pain/fim    HISTORY OF PRESENT ILLNESS:  I had the pleasure of seeing your patient, Carly [name], accompanied by her husband for a neurologic consultation regarding her diagnosis of multiple sclerosis.  She was being evaluated for scoliosis in 2011-2012 when she was found to have severe white matter changes on the MRI of the brain.    Beginning many years before that MRI she had difficulties with her left leg including a limp for many years that was worse when she was tired or hot.  Although there may have been mild slow progression of her leg issues, she mostly fluctuated more than progressed.    Her initial MRI of the  cervical spine was 12/19/2010.  I personally reviewed the images.  She has a focus within the spinal cord best seen on the sagittal images.  Additionally, adjacent to C3-C4 she has a more subtle focus.  Adjacent to C5-C6, there is hyperintensity within the spinal cord in the anterior horn region bilaterally which is an appearance more worrisome for myelopathy than demyelination.  The MRI was followed up with an MRI of the brain 01/07/2011.  Additionally I reviewed an MRI of the brain from 03/12/2016.  There is a stable pattern of severe white matter hyperintense signal changes with some involvement of the anterior temporal lobes, external capsules and basal ganglia.  The pattern is fairly symmetric.  She also has moderate cortical atrophy.  Currently, she has weakness in the left leg but feels that her right leg is strong.  There is also mild left leg spasticity and clumsiness.  She denies any symptoms in the arms.  She does not have significant urinary issues.  She does not have any visual problems.  Initially, after her MRI was performed she saw Dr. Pamalee Leyden of [facility] who diagnosed her with primary progressive MS.  Due to that diagnosis no medication was noted.  Couple years later she started to see Dr. Dellis Filbert who diagnosed her with a relapsing form of MS and placed her on Tysabri and then on ocrelizumab after it was approved last year.  She never had a  lumbar puncture for CSF analysis.  Her last infusion was January.  Her anxiety and overall well-being seems to be worse on the Ocrevus that it was on Tysabri.  However, she really has not had progression even when she was off of medications.      She has a long history of depression.  She has severe anxiety.  This is been a long-standing issue worse over the last decade.  For a while, while she was on Remeron, she did better.  However, she has had multiple changes in her psychiatric medications.  She feels that Abilify may also have helped her.   However, when combined with the duloxetine she had mild parkinsonian features.  She has had more difficulty with focus/attention and memory the past few years.    She denies cognitive issues but the husband feels that there has been mild progression of cognitive changes over the last decade.  She has headaches once a month but has neck pain daily.     Interestingly, her father has had some progressive neurologic symptoms and also has had some neuropsychiatric issues.  REVIEW OF SYSTEMS: Constitutional: No fevers, chills, sweats, or change in appetite.  She has fatigue Eyes: No visual changes, double vision, eye pain Ear, nose and throat: No hearing loss, ear pain, nasal congestion, sore throat Cardiovascular: No chest pain, palpitations Respiratory: No shortness of breath at rest or with exertion.   No wheezes GastrointestinaI: No nausea, vomiting, diarrhea, abdominal pain, fecal incontinence Genitourinary: No dysuria, urinary retention or frequency.  No nocturia. Musculoskeletal: No neck pain, back pain Integumentary: No rash, pruritus, skin lesions Neurological: as above Psychiatric: See above Endocrine: No palpitations, diaphoresis, change in appetite, change in weigh or increased thirst Hematologic/Lymphatic: No anemia, purpura, petechiae. Allergic/Immunologic: No itchy/runny eyes, nasal congestion, recent allergic reactions, rashes  ALLERGIES: Allergies  Allergen Reactions  . Asa [Aspirin] Swelling  . Buspirone Other (See Comments)    Headaches    . Levofloxacin Other (See Comments)    "weakness"  . Lexapro [Escitalopram Oxalate] Other (See Comments)    Weakness   . Motrin [Ibuprofen] Swelling and Other (See Comments)  . Soy Allergy Other (See Comments)  . Ceftin [Cefuroxime Axetil] Rash  . Penicillins Rash  . Sulfa Antibiotics Rash    HOME MEDICATIONS:  Current Outpatient Medications:  .  acetaminophen (TYLENOL) 325 MG tablet, Take 650 mg by mouth every 4 (four)  hours as needed., Disp: , Rfl:  .  ALPRAZolam (XANAX) 0.5 MG tablet, Take by mouth. Reported on 04/09/2016, Disp: , Rfl:  .  azelastine (ASTELIN) 0.1 % nasal spray, , Disp: , Rfl:  .  B Complex Vitamins (VITAMIN B COMPLEX PO), Take by mouth., Disp: , Rfl:  .  Calcium-Phosphorus-Vitamin D (CITRACAL +D3 PO), Take by mouth. 2 tablets twice a day, Disp: , Rfl:  .  Cholecalciferol (D3-1000 PO), Take by mouth., Disp: , Rfl:  .  Coenzyme Q10 (CO Q10) 100 MG CAPS, Take by mouth., Disp: , Rfl:  .  dalfampridine 10 MG TB12, Take 10 mg by mouth every 12 (twelve) hours., Disp: , Rfl:  .  fluticasone (FLONASE) 50 MCG/ACT nasal spray, Place 2 sprays into both nostrils daily., Disp: 16 g, Rfl: 4 .  magnesium oxide (MAG-OX) 400 MG tablet, Take 1 tablet (400 mg total) by mouth daily., Disp: 30 tablet, Rfl: 1 .  mirtazapine (REMERON) 30 MG tablet, Take 35 mg by mouth at bedtime. , Disp: , Rfl: 2 .  Multiple Vitamins-Minerals (CENTRUM SILVER  ULTRA WOMENS PO), Take by mouth., Disp: , Rfl:  .  ocrelizumab (OCREVUS) 300 MG/10ML injection, Inject 20 mLs (600 mg total) into the vein every 6 (six) months., Disp: 20 mL, Rfl: 0 .  REMERON 15 MG tablet, , Disp: , Rfl:  .  sertraline (ZOLOFT) 50 MG tablet, Take 50 mg by mouth every morning., Disp: , Rfl: 1 .  sodium chloride (V-R NASAL SPRAY SALINE) 0.65 % nasal spray, Place into the nose., Disp: , Rfl:  .  ARIPiprazole (ABILIFY) 5 MG tablet, Take 1 tablet (5 mg total) by mouth daily., Disp: 30 tablet, Rfl: 5 .  butalbital-acetaminophen-caffeine (FIORICET, ESGIC) 50-325-40 MG tablet, Take 1-2 tablets by mouth every 6 (six) hours as needed for headache. (Patient not taking: Reported on 02/04/2018), Disp: 20 tablet, Rfl: 0 No current facility-administered medications for this visit.   Facility-Administered Medications Ordered in Other Visits:  .  0.9 %  sodium chloride infusion, , Intravenous, Continuous, Corcoran, Melissa C, MD, Last Rate: 10 mL/hr at 04/25/16 0855 .   acetaminophen (TYLENOL) tablet 650 mg, 650 mg, Oral, Once, Corcoran, Drue Second, MD  PAST MEDICAL HISTORY: Past Medical History:  Diagnosis Date  . Allergy   . Anxiety   . Aspiration pneumonia ([place])   . Depression   . Frequent headaches    H/O  . GERD (gastroesophageal reflux disease)   . History of chicken pox   . History of colon polyps   . Hx of migraines   . Multiple sclerosis ([place])   . PONV (postoperative nausea and vomiting)     PAST SURGICAL HISTORY: Past Surgical History:  Procedure Laterality Date  . BACK SURGERY    . CHOLECYSTECTOMY    . COLONOSCOPY WITH PROPOFOL N/A 05/18/2017   Procedure: COLONOSCOPY WITH PROPOFOL;  Surgeon: [name], MD;  Location: [facility] ENDOSCOPY;  Service: Endoscopy;  Laterality: N/A;  . FOOT SURGERY  2015  . GALLBLADDER SURGERY  2008  . HARDWARE REMOVAL Left 02/14/2016   Procedure: LEFT FOOT REMOVAL DEEP IMPLANT;  Surgeon: [name], MD;  Location: [facility];  Service: Orthopedics;  Laterality: Left;  . HERNIA REPAIR     Inguinal Hernia Repair  . SPINE SURGERY  2014    FAMILY HISTORY: Family History  Problem Relation Age of Onset  . Arthritis Mother   . Hypertension Mother   . Macular degeneration Mother   . Hypertension Father   . Hyperlipidemia Father   . Heart disease Maternal Grandfather   . Diabetes Maternal Grandfather   . Kidney disease Paternal Grandmother     SOCIAL HISTORY:  Social History   Socioeconomic History  . Marital status: Married    Spouse name: Not on file  . Number of children: Not on file  . Years of education: Not on file  . Highest education level: Not on file  Occupational History  . Not on file  Social Needs  . Financial resource strain: Not on file  . Food insecurity:    Worry: Not on file    Inability: Not on file  . Transportation needs:    Medical: Not on file    Non-medical: Not on file  Tobacco Use  . Smoking status: Never Smoker  . Smokeless tobacco: Never Used   Substance and Sexual Activity  . Alcohol use: No    Alcohol/week: 0.0 oz  . Drug use: No  . Sexual activity: Not Currently  Lifestyle  . Physical activity:    Days per week: Not  on file    Minutes per session: Not on file  . Stress: Not on file  Relationships  . Social connections:    Talks on phone: Not on file    Gets together: Not on file    Attends religious service: Not on file    Active member of club or organization: Not on file    Attends meetings of clubs or organizations: Not on file    Relationship status: Not on file  . Intimate partner violence:    Fear of current or ex partner: Not on file    Emotionally abused: Not on file    Physically abused: Not on file    Forced sexual activity: Not on file  Other Topics Concern  . Not on file  Social History Narrative  . Not on file     PHYSICAL EXAM  Vitals:   02/04/18 1341  BP: (!) 146/93  Pulse: 97  Resp: 16  Weight: 170 lb (77.1 kg)  Height: 5\' 3"  (1.6 m)    Body mass index is 30.11 kg/m.   General: The patient is well-developed and well-nourished and in no acute distress  Eyes:  Funduscopic exam shows normal optic discs and retinal vessels.  Neck: The neck is supple, no carotid bruits are noted.  The neck is nontender.  Cardiovascular: The heart has a regular rate and rhythm with a normal S1 and S2. There were no murmurs, gallops or rubs. Lungs are clear to auscultation.  Skin: Extremities are without significant edema.  Musculoskeletal:  Back is nontender  Neurologic Exam  Mental status: She is very anxious.  The patient is alert and oriented x 3 at the time of the examination. The patient has apparent normal recent and remote memory, with an apparently normal attention span and concentration ability.   Speech is normal.  Cranial nerves: Extraocular movements are full. Pupils are equal, round, and reactive to light and accomodation.  Visual fields are full.  Facial symmetry is present. There is  good facial sensation to soft touch bilaterally.Facial strength is normal.  Trapezius and sternocleidomastoid strength is normal. No dysarthria is noted.  The tongue is midline, and the patient has symmetric elevation of the soft palate. No obvious hearing deficits are noted.  Motor:  Muscle bulk is normal.   Tone is normal. Strength is  5 / 5 in all 4 extremities.   Sensory: Sensory testing is intact to pinprick, soft touch and vibration sensation in all 4 extremities.  Coordination: Cerebellar testing reveals good finger-nose-finger but reduced left heel-to-shin.  Gait and station: Station is normal.   Her gait is wide and she drags the left leg.  She has difficulty with tandem gait.  Romberg is negative.   Reflexes: Deep tendon reflexes are symmetric and normal in the arm but the left knee reflex is greater than the right and she has spread.  Ankle reflexes were 1+ and symmetric.Marland Kitchen   Plantar responses are flexor.    DIAGNOSTIC DATA (LABS, IMAGING, TESTING) - I reviewed patient records, labs, notes, testing and imaging myself where available.  Lab Results  Component Value Date   WBC 9.8 12/20/2017   HGB 14.5 12/20/2017   HCT 42.9 12/20/2017   MCV 84.6 12/20/2017   PLT 350 12/20/2017      Component Value Date/Time   NA 136 12/20/2017 2130   NA 138 09/18/2014 1048   K 4.1 12/20/2017 2130   K 4.0 09/18/2014 1048   CL 103 12/20/2017 2130  CL 101 09/18/2014 1048   CO2 23 12/20/2017 2130   CO2 27 09/18/2014 1048   GLUCOSE 139 (H) 12/20/2017 2130   GLUCOSE 88 09/18/2014 1048   BUN 7 12/20/2017 2130   BUN 9 09/18/2014 1048   CREATININE 0.57 12/20/2017 2130   CREATININE 0.83 09/18/2014 1048   CALCIUM 9.4 12/20/2017 2130   CALCIUM 9.5 09/18/2014 1048   PROT 7.2 03/10/2017 0952   PROT 7.8 09/18/2014 1048   ALBUMIN 5.0 03/10/2017 0952   ALBUMIN 4.0 09/18/2014 1048   AST 25 03/10/2017 0952   AST 23 09/18/2014 1048   ALT 24 03/10/2017 0952   ALT 42 09/18/2014 1048   ALKPHOS 73  03/10/2017 0952   ALKPHOS 161 (H) 09/18/2014 1048   BILITOT 0.4 03/10/2017 0952   BILITOT 0.3 09/18/2014 1048   GFRNONAA >60 12/20/2017 2130   GFRNONAA >60 09/18/2014 1048   GFRAA >60 12/20/2017 2130   GFRAA >60 09/18/2014 1048   Lab Results  Component Value Date   CHOL 254 (H) 03/10/2017   HDL 70.90 03/10/2017   LDLCALC 148 (H) 07/15/2016   LDLDIRECT 138.0 03/10/2017   TRIG 201.0 (H) 03/10/2017   CHOLHDL 4 03/10/2017   Lab Results  Component Value Date   HGBA1C 5.4 03/10/2017   Lab Results  Component Value Date   IPJASNKN39 767 03/10/2017   Lab Results  Component Value Date   TSH 1.80 01/06/2018       ASSESSMENT AND PLAN  Multiple sclerosis ([place]) - Plan: Stratify JCV Antibody Test ([facility])  Unsteady gait - Plan: Arylsulfatase A Deficiency, Alpha galactosidase  Leukodystrophy - Plan: Arylsulfatase A Deficiency, Alpha galactosidase  Depression with anxiety  Cognitive and behavioral changes   In summary, Mrs. Estrada is a 67 year old woman with a long history of left leg weakness and stiffness who also has had severe psychiatric issues that has not responded well to multiple medications.  She was diagnosed with MS based on her left leg symptoms and very abnormal MRI.  That diagnosis is certainly possible especially since she appears to have focus in the spinal cord as well.  However, the pattern in the brain is more consistent with leukodystrophy than with multiple sclerosis therefore, I feel we need to be more certain of the diagnosis before continuing her on disease modifying therapies that may have significant risk.  Some of the white matter changes involve the anterior temporal lobe and the external capsule, a finding that can be seen with CADASIL  I will check arylsulfatase a, alpha galactosidase and CADASIL genetic test.   Interestingly, her father has also had some progressive neurologic issues and a similar neuro psychiatric profile according to the  husband.  Blood test are normal, I will recommend that we proceed with CSF analysis to determine if she has oligoclonal bands and elevated IgG index.  If abnormalities are present, then MS is more likely and I would recommend that she continue on the medication.  She would prefer to be on Tysabri rather than ocrelizumab.  Additionally, she has depression, anxiety and agitation.  I will start Abilify 5 mg.  If she does not get better within a month then, consider lamotrigine as a mood stabilizing agent.  She will return to see me in a couple months after the results return.      Leanza Shepperson A. Felecia Shelling, MD, [facility] 3/41/9379, 0:24 PM Certified in Neurology, Clinical Neurophysiology, Sleep Medicine, Pain Medicine and Neuroimaging  [facility] [address] [place], [place] [address] [phone]  [phone] 

## 2018-02-05 ENCOUNTER — Telehealth: Payer: Self-pay | Admitting: Neurology

## 2018-02-05 NOTE — Telephone Encounter (Signed)
Spoke with mr. Carly [name] and explained that RAS was looking at pt's actual images yesterday--this was not a study sent by [facility], but one that was done there, and already in her medical records, so that RAS was able to view it.  He verbalized understanding of same/fim

## 2018-02-05 NOTE — Telephone Encounter (Signed)
Pt's husband called said he and the pt think GI gave Dr Felecia Shelling the wrong images. Please call to discuss

## 2018-02-06 ENCOUNTER — Encounter: Payer: Self-pay | Admitting: Internal Medicine

## 2018-02-06 NOTE — Assessment & Plan Note (Signed)
Low cholesterol diet and exercise.  Follow lipid panel.   

## 2018-02-06 NOTE — Assessment & Plan Note (Signed)
Back pain as outlined.  Flares intermittently.  Refilled zanaflex.  Helps.  Rarely takes.  Follow.

## 2018-02-06 NOTE — Assessment & Plan Note (Signed)
Improved.  Discussed recommendation for compression hose.

## 2018-02-06 NOTE — Assessment & Plan Note (Signed)
Followed by [name].  Just started zoloft.  On remeron.  Discussed holding on making changes in medication since just started.  Follow. Keep f/u with psychiatry.

## 2018-02-06 NOTE — Assessment & Plan Note (Signed)
Seeing Dr Kerman Passey tomorrow.  Plans to discuss medications.

## 2018-02-08 ENCOUNTER — Telehealth: Payer: Self-pay | Admitting: Neurology

## 2018-02-08 NOTE — Telephone Encounter (Signed)
Pts husband requesting a call back stating that he has additional information for [name]. Regarding CT scans and pts headaches, Bill did not want to go into more detail with me. please call to advise

## 2018-02-08 NOTE — Telephone Encounter (Signed)
Spoke with Carly [name] and explained RAS is awaiting lab results that will help him make a definitive dx., whether it is MS or something else.  At that time, if he needs to speak with Dr. Jacqulynn Cadet, he will call him then.  But for now, even if he spoke with Dr. Jacqulynn Cadet today, he would still need to wait on the lab results.  [name] verbalized understanding of same/fim

## 2018-02-08 NOTE — Telephone Encounter (Signed)
Pt's husband called back states the pt talked with Mindy at Dr Verita Lamb and she suggested Dr Felecia Shelling call Dr Jacqulynn Cadet to discuss the MRI's and why Dr Felecia Shelling does not think she has MS.  He said the pt is upset and thinking what else could be wrong with her if it is not MS.

## 2018-02-12 LAB — ARYLSULFATASE A DEFICIENCY: ARYLSULFATASE A ACTIVITY: 47.9 nmol/h/mg{prot} (ref 25.0–90.0)

## 2018-02-25 ENCOUNTER — Ambulatory Visit: Payer: Self-pay

## 2018-02-25 NOTE — Telephone Encounter (Signed)
Patient called in with c/o "lower abdominal pain." She says "it hurts to my right lower quadrant. It started 2 nights ago, then went away. It came back today, but the pain is eased down right now, to a 1-2. It is a constant nagging pain. My bowels moved 2 days ago after using a suppository and it was hard balls, only a few. I have chronic constipation any way, but I wonder if this is the problem." I ask does the pain move anywhere else, she says "I felt like it moved into my back today at some point, but not now." I asked about other symptoms, she says "just constipation." According to protocol, see PCP within 24 hours, no availability with PCP, appointment scheduled for tomorrow at 1500 with Mable Paris, FNP, care advice given, patient verbalized understanding.   Reason for Disposition . [1] MILD pain (e.g., does not interfere with normal activities) AND [2] pain comes and goes (cramps) AND [3] present > 48 hours  Answer Assessment - Initial Assessment Questions 1. LOCATION: "Where does it hurt?"      Lower right quadrant 2. RADIATION: "Does the pain shoot anywhere else?" (e.g., chest, back)     Into the back a minute ago 3. ONSET: "When did the pain begin?" (e.g., minutes, hours or days ago)      2 nights ago, but skipped yesterday, then back today 4. SUDDEN: "Gradual or sudden onset?"     Gradual 5. PATTERN "Does the pain come and go, or is it constant?"    - If constant: "Is it getting better, staying the same, or worsening?"      (Note: Constant means the pain never goes away completely; most serious pain is constant and it progresses)     - If intermittent: "How long does it last?" "Do you have pain now?"     (Note: Intermittent means the pain goes away completely between bouts)     Come and go 6. SEVERITY: "How bad is the pain?"  (e.g., Scale 1-10; mild, moderate, or severe)   - MILD (1-3): doesn't interfere with normal activities, abdomen soft and not tender to touch    - MODERATE  (4-7): interferes with normal activities or awakens from sleep, tender to touch    - SEVERE (8-10): excruciating pain, doubled over, unable to do any normal activities      1-2 7. RECURRENT SYMPTOM: "Have you ever had this type of abdominal pain before?" If so, ask: "When was the last time?" and "What happened that time?"      Yes with the gallbladder; removed 6 years ago 8. CAUSE: "What do you think is causing the abdominal pain?"     I don't know 9. RELIEVING/AGGRAVATING FACTORS: "What makes it better or worse?" (e.g., movement, antacids, bowel movement)     Sitting down 10. OTHER SYMPTOMS: "Has there been any vomiting, diarrhea, constipation, or urine problems?"       Constipation; Last BM 2 days ago and had to use a suppository 11. PREGNANCY: "Is there any chance you are pregnant?" "When was your last menstrual period?"       No  Protocols used: ABDOMINAL PAIN - [facility]

## 2018-02-26 ENCOUNTER — Encounter: Payer: Self-pay | Admitting: Family

## 2018-02-26 ENCOUNTER — Ambulatory Visit: Payer: Medicare Other | Admitting: Family

## 2018-02-26 VITALS — BP 136/78 | HR 96 | Temp 97.9°F | Resp 16

## 2018-02-26 DIAGNOSIS — R0981 Nasal congestion: Secondary | ICD-10-CM

## 2018-02-26 DIAGNOSIS — R1031 Right lower quadrant pain: Secondary | ICD-10-CM

## 2018-02-26 NOTE — Progress Notes (Signed)
Subjective:    Patient ID: Carly [name], female    DOB: [birth year], 67 y.o.   MRN: [id number]  CC: Carly [name] is a 67 y.o. female who presents today for an acute visit.    HPI: Complains cough, sore throat 10 days ago, unchanged. Accompanied by husband who notes recently had a 'head cold.'   Endorses chills ( resolved), body aches, sinus pressure.  Just started mucinex which is helping congestion.  On flonase, zyrtec, affrin with relief. Congestion is clear.  No  Lung disease. No wheezing, sob, ear pain.   She also complains of RLQ pain, which started 10 days ago, unchanged.  Onset correlates with cough. Comes and goes. Not migratory. Notes mild pain today. None while in exam room. When pain occurs, describes as 'dull ache.'  Bending bothers pain. If still, pain isn't noticeable. Doesn't recall any heavy lifting. Tried heat with relief, xanaflex helps.   BM yesterday. Used a suppository yesterday to have a bowel movement.   R/o hernia repair surgery, cholecystectomy    UTD colonoscopy; Diverticulosis noted on colonoscopy. Polpys.   No h/o diverticulitis.    Anxiety- follows with psychiatry  MS- follows with Dr Lauris Chroman last note; pending work up for Leukodystrophy   HISTORY:  Past Medical History:  Diagnosis Date  . Allergy   . Anxiety   . Aspiration pneumonia ([place])   . Depression   . Frequent headaches    H/O  . GERD (gastroesophageal reflux disease)   . History of chicken pox   . History of colon polyps   . Hx of migraines   . Multiple sclerosis ([place])   . PONV (postoperative nausea and vomiting)    Past Surgical History:  Procedure Laterality Date  . BACK SURGERY    . CHOLECYSTECTOMY    . COLONOSCOPY WITH PROPOFOL N/A 05/18/2017   Procedure: COLONOSCOPY WITH PROPOFOL;  Surgeon: Carly Silvas, MD;  Location: [facility] ENDOSCOPY;  Service: Endoscopy;  Laterality: N/A;  . FOOT SURGERY  2015  . GALLBLADDER SURGERY  2008  . HARDWARE REMOVAL Left  02/14/2016   Procedure: LEFT FOOT REMOVAL DEEP IMPLANT;  Surgeon: Carly Simmer, MD;  Location: [place];  Service: Orthopedics;  Laterality: Left;  . HERNIA REPAIR     Inguinal Hernia Repair  . SPINE SURGERY  2014   Family History  Problem Relation Age of Onset  . Arthritis Mother   . Hypertension Mother   . Macular degeneration Mother   . Hypertension Father   . Hyperlipidemia Father   . Heart disease Maternal Grandfather   . Diabetes Maternal Grandfather   . Kidney disease Paternal Grandmother     Allergies: Asa [aspirin]; Buspirone; Levofloxacin; Lexapro [escitalopram oxalate]; Motrin [ibuprofen]; Soy allergy; Ceftin [cefuroxime axetil]; Penicillins; and Sulfa antibiotics Current Outpatient Medications on File Prior to Visit  Medication Sig Dispense Refill  . acetaminophen (TYLENOL) 325 MG tablet Take 650 mg by mouth every 4 (four) hours as needed.    . ALPRAZolam (XANAX) 0.5 MG tablet Take by mouth. Reported on 04/09/2016    . ARIPiprazole (ABILIFY) 5 MG tablet Take 1 tablet (5 mg total) by mouth daily. 30 tablet 5  . azelastine (ASTELIN) 0.1 % nasal spray     . B Complex Vitamins (VITAMIN B COMPLEX PO) Take by mouth.    . butalbital-acetaminophen-caffeine (FIORICET, ESGIC) 50-325-40 MG tablet Take 1-2 tablets by mouth every 6 (six) hours as needed for headache. (Patient not taking: Reported on 02/04/2018) 20 tablet  0  . Calcium-Phosphorus-Vitamin D (CITRACAL +D3 PO) Take by mouth. 2 tablets twice a day    . Cholecalciferol (D3-1000 PO) Take by mouth.    . Coenzyme Q10 (CO Q10) 100 MG CAPS Take by mouth.    . dalfampridine 10 MG TB12 Take 10 mg by mouth every 12 (twelve) hours.    . fluticasone (FLONASE) 50 MCG/ACT nasal spray Place 2 sprays into both nostrils daily. 16 g 4  . magnesium oxide (MAG-OX) 400 MG tablet Take 1 tablet (400 mg total) by mouth daily. 30 tablet 1  . mirtazapine (REMERON) 30 MG tablet Take 35 mg by mouth at bedtime.   2  . Multiple  Vitamins-Minerals (CENTRUM SILVER ULTRA WOMENS PO) Take by mouth.    Marland Kitchen ocrelizumab (OCREVUS) 300 MG/10ML injection Inject 20 mLs (600 mg total) into the vein every 6 (six) months. 20 mL 0  . REMERON 15 MG tablet     . sertraline (ZOLOFT) 50 MG tablet Take 50 mg by mouth every morning.  1  . sodium chloride (V-R NASAL SPRAY SALINE) 0.65 % nasal spray Place into the nose.     Current Facility-Administered Medications on File Prior to Visit  Medication Dose Route Frequency Provider Last Rate Last Dose  . 0.9 %  sodium chloride infusion   Intravenous Continuous Carly Asal, MD 10 mL/hr at 04/25/16 0855    . acetaminophen (TYLENOL) tablet 650 mg  650 mg Oral Once Carly Asal, MD        Social History   Tobacco Use  . Smoking status: Never Smoker  . Smokeless tobacco: Never Used  Substance Use Topics  . Alcohol use: No    Alcohol/week: 0.0 oz  . Drug use: No    Review of Systems  Constitutional: Negative for chills and fever.  HENT: Positive for congestion, sinus pain, sore throat and trouble swallowing. Negative for ear pain.   Respiratory: Positive for cough. Negative for shortness of breath and wheezing.   Cardiovascular: Negative for chest pain and palpitations.  Gastrointestinal: Positive for abdominal pain and constipation (chronic). Negative for abdominal distention, nausea and vomiting.  Musculoskeletal: Positive for myalgias. Negative for arthralgias.  Neurological: Positive for weakness (chronic LE) and headaches. Negative for dizziness and numbness.      Objective:    BP 136/78 (BP Location: Left Arm, Patient Position: Sitting, Cuff Size: Normal)   Pulse 96   Temp 97.9 F (36.6 C) (Oral)   Resp 16   SpO2 97%    Physical Exam  Constitutional: She appears well-developed and well-nourished.  HENT:  Head: Normocephalic and atraumatic.  Right Ear: Hearing, tympanic membrane, external ear and ear canal normal. No drainage, swelling or tenderness. No  foreign bodies. Tympanic membrane is not erythematous and not bulging. No middle ear effusion. No decreased hearing is noted.  Left Ear: Hearing, tympanic membrane, external ear and ear canal normal. No drainage, swelling or tenderness. No foreign bodies. Tympanic membrane is not erythematous and not bulging.  No middle ear effusion. No decreased hearing is noted.  Nose: Nose normal. No rhinorrhea. Right sinus exhibits no maxillary sinus tenderness and no frontal sinus tenderness. Left sinus exhibits no maxillary sinus tenderness and no frontal sinus tenderness.  Mouth/Throat: Uvula is midline, oropharynx is clear and moist and mucous membranes are normal. No oropharyngeal exudate, posterior oropharyngeal edema, posterior oropharyngeal erythema or tonsillar abscesses.  Eyes: Conjunctivae are normal.  Cardiovascular: Normal rate, regular rhythm, normal heart sounds and normal pulses.  Pulmonary/Chest:  Effort normal and breath sounds normal. She has no wheezes. She has no rhonchi. She has no rales.  Abdominal: Soft. Normal appearance and bowel sounds are normal. She exhibits no distension, no fluid wave, no ascites and no mass. There is no tenderness. There is no rigidity, no rebound, no guarding and no CVA tenderness.  Lymphadenopathy:       Head (right side): No submental, no submandibular, no tonsillar, no preauricular, no posterior auricular and no occipital adenopathy present.       Head (left side): No submental, no submandibular, no tonsillar, no preauricular, no posterior auricular and no occipital adenopathy present.    She has no cervical adenopathy.  Neurological: She is alert.  Skin: Skin is warm and dry.  Psychiatric: She has a normal mood and affect. Her speech is normal and behavior is normal. Thought content normal.  Vitals reviewed.      Assessment & Plan:   1. Sinus congestion, myalgias Patient is afebrile and nontoxic in appearance.  Duration 10 days.  I offered her an  antibiotic today is based on duration of symptoms, she may have acquired a secondary bacterial infection.  Working diagnosis however I suspect viral etiology as primary.   She was strep negative.  At this time, patient and husband and I felt comfortable with close vigilance. patient would continue Mucinex, use Delsym for cough.  She would remain hypervigilant let me know of any new or worsening symptoms.  Particularly if over the weekend, symptoms were to worsen, patient agreed to go to emergency room  2. Right lower quadrant pain Etiology of pain is nonspecific. Bending is bothersome.  She is not experiencing any pain in exam room nor was able to elicit any pain on exam.  I am reassured by her benign abdominal exam.  Discussed with patient differentials which would be at most worrisome include diverticulitis, appendicitis however patient is not septic in appearance and again I cannot elicit any abdominal tenderness, rebound.  She politely declines a CT abdomen and pelvis at this time.   Again, patient and husband felt comfortable with watching closely especially since over the weekend.  We also discussed a likely differential including muscular strain due to coughing.  I am reassured that pain has responded to heat, Zanaflex.  I advised her that she may continue these therapies at home and let me know if worsens.  Again any new or worsening symptoms over the weekend, patient understands to go to emergency room.    I am having Israa M. Arbutus Ped maintain her acetaminophen, dalfampridine, Multiple Vitamins-Minerals (CENTRUM SILVER ULTRA WOMENS PO), Co Q10, Calcium-Phosphorus-Vitamin D (CITRACAL +D3 PO), B Complex Vitamins (VITAMIN B COMPLEX PO), Cholecalciferol (D3-1000 PO), ALPRAZolam, sodium chloride, mirtazapine, ocrelizumab, butalbital-acetaminophen-caffeine, azelastine, magnesium oxide, REMERON, sertraline, fluticasone, and ARIPiprazole.   No orders of the defined types were placed in this  encounter.   Return precautions given.   Risks, benefits, and alternatives of the medications and treatment plan prescribed today were discussed, and patient expressed understanding.   Education regarding symptom management and diagnosis given to patient on AVS.  Continue to follow with Einar Pheasant, MD for routine health maintenance.   [name] and I agreed with plan.   Mable Paris, FNP

## 2018-02-26 NOTE — Patient Instructions (Addendum)
As discussed at great length today, the etiology of your symptoms is non specific  at this time.  Most important, I want you to stay very vigilant.   I suspect a viral or bacterial component or sinus congestion.  I know you declined antibiotics today however I want to you to let me know if you not improve on your own.  I would advise Mucinex , delsym.   As also discussed, I suspect from the coughing, you may have pulled a muscle in the lower abdomen.  I am reassured that it improves with heat, Zanaflex.   may continue using heat, and Zanaflex as needed.  may also try over-the-counter topical agent such as Aspercreme which is good for muscles.  Please let me know if any change or worsening of your symptoms.    Again I want you to stay vigilant.

## 2018-03-01 ENCOUNTER — Other Ambulatory Visit: Payer: Self-pay | Admitting: Family

## 2018-03-01 ENCOUNTER — Other Ambulatory Visit (INDEPENDENT_AMBULATORY_CARE_PROVIDER_SITE_OTHER): Payer: Medicare Other

## 2018-03-01 ENCOUNTER — Telehealth: Payer: Self-pay | Admitting: Family

## 2018-03-01 ENCOUNTER — Ambulatory Visit
Admission: RE | Admit: 2018-03-01 | Discharge: 2018-03-01 | Disposition: A | Discharge status: Home / Self Care | Payer: Medicare Other | Source: Ambulatory Visit | Attending: Family | Admitting: Family

## 2018-03-01 DIAGNOSIS — K449 Diaphragmatic hernia without obstruction or gangrene: Secondary | ICD-10-CM | POA: Insufficient documentation

## 2018-03-01 DIAGNOSIS — N135 Crossing vessel and stricture of ureter without hydronephrosis: Secondary | ICD-10-CM | POA: Diagnosis not present

## 2018-03-01 DIAGNOSIS — R109 Unspecified abdominal pain: Secondary | ICD-10-CM

## 2018-03-01 DIAGNOSIS — Z9049 Acquired absence of other specified parts of digestive tract: Secondary | ICD-10-CM | POA: Insufficient documentation

## 2018-03-01 DIAGNOSIS — R1031 Right lower quadrant pain: Secondary | ICD-10-CM

## 2018-03-01 DIAGNOSIS — R8290 Unspecified abnormal findings in urine: Secondary | ICD-10-CM

## 2018-03-01 DIAGNOSIS — M791 Myalgia, unspecified site: Secondary | ICD-10-CM

## 2018-03-01 DIAGNOSIS — R0981 Nasal congestion: Secondary | ICD-10-CM

## 2018-03-01 DIAGNOSIS — Z9889 Other specified postprocedural states: Secondary | ICD-10-CM | POA: Insufficient documentation

## 2018-03-01 LAB — CBC WITH DIFFERENTIAL/PLATELET
BASOS PCT: 1 % (ref 0.0–3.0)
Basophils Absolute: 0.1 10*3/uL (ref 0.0–0.1)
EOS PCT: 3.6 % (ref 0.0–5.0)
Eosinophils Absolute: 0.2 10*3/uL (ref 0.0–0.7)
HCT: 44.3 % (ref 36.0–46.0)
Hemoglobin: 15.1 g/dL — ABNORMAL HIGH (ref 12.0–15.0)
LYMPHS ABS: 1.9 10*3/uL (ref 0.7–4.0)
Lymphocytes Relative: 31.1 % (ref 12.0–46.0)
MCHC: 34 g/dL (ref 30.0–36.0)
MCV: 84.5 fl (ref 78.0–100.0)
MONO ABS: 0.4 10*3/uL (ref 0.1–1.0)
MONOS PCT: 7.2 % (ref 3.0–12.0)
NEUTROS ABS: 3.5 10*3/uL (ref 1.4–7.7)
NEUTROS PCT: 57.1 % (ref 43.0–77.0)
PLATELETS: 331 10*3/uL (ref 150.0–400.0)
RBC: 5.24 Mil/uL — AB (ref 3.87–5.11)
RDW: 14.2 % (ref 11.5–15.5)
WBC: 6.1 10*3/uL (ref 4.0–10.5)

## 2018-03-01 LAB — COMPREHENSIVE METABOLIC PANEL
ALK PHOS: 68 U/L (ref 39–117)
ALT: 33 U/L (ref 0–35)
AST: 33 U/L (ref 0–37)
Albumin: 4.4 g/dL (ref 3.5–5.2)
BUN: 9 mg/dL (ref 6–23)
CHLORIDE: 103 meq/L (ref 96–112)
CO2: 24 meq/L (ref 19–32)
Calcium: 9.7 mg/dL (ref 8.4–10.5)
Creatinine, Ser: 0.72 mg/dL (ref 0.40–1.20)
GFR: 85.71 mL/min (ref >60.00)
GLUCOSE: 150 mg/dL — AB (ref 70–99)
POTASSIUM: 3.9 meq/L (ref 3.5–5.1)
Sodium: 137 mEq/L (ref 135–145)
TOTAL PROTEIN: 7 g/dL (ref 6.0–8.3)
Total Bilirubin: 0.3 mg/dL (ref 0.2–1.2)

## 2018-03-01 LAB — URINALYSIS, ROUTINE W REFLEX MICROSCOPIC
BILIRUBIN URINE: NEGATIVE
Hgb urine dipstick: NEGATIVE
Ketones, ur: NEGATIVE
NITRITE: NEGATIVE
PH: 6 (ref 5.0–8.0)
RBC / HPF: NONE SEEN (ref 0–?)
Specific Gravity, Urine: 1.01 (ref 1.000–1.030)
TOTAL PROTEIN, URINE-UPE24: NEGATIVE
Urine Glucose: NEGATIVE
Urobilinogen, UA: 0.2 (ref 0.0–1.0)

## 2018-03-01 LAB — POCT I-STAT CREATININE: CREATININE: 0.7 mg/dL (ref 0.44–1.00)

## 2018-03-01 MED ORDER — AZITHROMYCIN 250 MG PO TABS: ORAL_TABLET | ORAL | 0 refills | Status: DC

## 2018-03-01 MED ORDER — IOPAMIDOL (ISOVUE-300) INJECTION 61%
100.0000 mL | Freq: Once | INTRAVENOUS | Status: AC | PRN
  Administered 2018-03-01: 100 mL via INTRAVENOUS

## 2018-03-01 NOTE — Telephone Encounter (Addendum)
Spoke with patient , still coughing and aching , symptoms came back last night , hurting in right  right lower quadrant  Tender touch  States she is going to [place] today if you don't think she should go please advise.   Wants you to go ahead and order CT scan and call in antibiotic.  Coming in for labs and urine.

## 2018-03-01 NOTE — Telephone Encounter (Signed)
Spoke with patient regarding urine Pending culture No dysuria  No rlq abdominal pain. Feels better today. Body aches improved. Afebrile.   Discussed CT. 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  Advised pt to discuss cholesterol with PCP and notified pcp of this.   Low suspicion ovarian cyst causing pain, however pt reports h/o cysts. Referral to GYN for consult. Patient aware of this.   Congestion and cough continue - would like antibiotic. In the past has done well on azithromycin.  Based on duration of symptoms, appropriate to start zpak. Patient to call us tomorrow and let us know how she is doing.

## 2018-03-01 NOTE — Telephone Encounter (Signed)
Call pt  How she is doing?  We didn't discuss urinary complaints when she was here? Does she have h/o of those?Would she like to come in for urine specimen?  If symptoms not improved or certainly worsening, I would like to labs as wlel

## 2018-03-01 NOTE — Telephone Encounter (Signed)
Patient advised to come in for labs, Melissa scheduled for CT scan and going after labs . Patient aware.

## 2018-03-01 NOTE — Telephone Encounter (Signed)
Call pt  Labs ordered, urine studies, stat CT ab and pelvis with contrast   Please offer re- evaluation  Melissa, can we get CT scheduled??

## 2018-03-02 ENCOUNTER — Encounter: Payer: Self-pay | Admitting: Neurology

## 2018-03-02 ENCOUNTER — Telehealth: Payer: Self-pay | Admitting: Internal Medicine

## 2018-03-02 NOTE — Telephone Encounter (Signed)
Copied from [place] [id number]. Topic: Quick Communication - Lab Results >> Mar 01, 2018  4:50 PM Bare, Patriciaann Clan, CMA wrote: Called patient to inform them of  lab results. When patient returns call, triage nurse may disclose results.   Pt calling back for lab work. CB# [phone]

## 2018-03-02 NOTE — Telephone Encounter (Signed)
Charted in result notes. 

## 2018-03-02 NOTE — Telephone Encounter (Signed)
Calling back again, call back [phone]

## 2018-03-05 ENCOUNTER — Telehealth: Payer: Self-pay | Admitting: Family

## 2018-03-05 DIAGNOSIS — N135 Crossing vessel and stricture of ureter without hydronephrosis: Secondary | ICD-10-CM

## 2018-03-05 NOTE — Telephone Encounter (Signed)
Left voice mail to call back ok for PEC to speak to patient 

## 2018-03-05 NOTE — Telephone Encounter (Signed)
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 Referral placed  Ensure too that she has an appt with GYN  Let us know if symptoms are not improving on antibiotic.

## 2018-03-05 NOTE — Telephone Encounter (Signed)
Patient advised of below .   She doesn't have appointment with Gyn yet.  Right lower quadrant pain is better no pain today. She thinks she just pulled something when she coughed. Cough is better, no fever of chills.   Just fatigue and weak due to antibiotic.  She states antibiotic always does this when she takes this.  Still taking in fluids and appetite fair.   She will make appointment with gyn.  Patient is wondering why she needs to gyn ?

## 2018-03-09 NOTE — Telephone Encounter (Signed)
Pt's husband called he said the pt will be going to [facility] on 6/3 for a 2nd opinion. He is wanting to let RN know and to discuss this with her today or tomorrow if possible. Please call to advise.

## 2018-03-09 NOTE — Telephone Encounter (Signed)
Spoke with [name] RAS has expressed that pt. may not have MS, they have decided to go to [facility] for a 2nd opinion.  I have explained this is always encouraged when a pt. has questions, and we wish her the best of luck.  He verbalized understanding of same, sts. he will ask that notes from the appt. at [facility] be forwarded to Dr. Arlean Hopping

## 2018-03-10 NOTE — Telephone Encounter (Signed)
Call pt Clarify for her Referral to GYN due to ovarian cysts seen on CT abdomen and pelvis Let us know if she hasnt an appt yet .  She needs GYN AND urology CONSULT. Referrals placed for both  Ensure she is clear on this.

## 2018-03-11 ENCOUNTER — Ambulatory Visit: Payer: Medicare Other

## 2018-03-11 ENCOUNTER — Telehealth: Payer: Self-pay | Admitting: Family

## 2018-03-11 NOTE — Telephone Encounter (Signed)
Patient advised of below and verbalized understanding. She has appointment with gyn 5/231/19

## 2018-03-11 NOTE — Telephone Encounter (Signed)
Call pt  reviweing chart and realize that urine culture was not processed I am so that was overlooked. Does she have any urinary complaints?  We would need to repeat the urine if so?   Again, please apologize on my behalf

## 2018-03-12 ENCOUNTER — Other Ambulatory Visit: Payer: Medicare Other

## 2018-03-12 ENCOUNTER — Ambulatory Visit (INDEPENDENT_AMBULATORY_CARE_PROVIDER_SITE_OTHER): Payer: Medicare Other | Admitting: Obstetrics & Gynecology

## 2018-03-12 ENCOUNTER — Encounter: Payer: Self-pay | Admitting: Obstetrics & Gynecology

## 2018-03-12 VITALS — BP 140/90 | Ht 63.0 in | Wt 172.0 lb

## 2018-03-12 DIAGNOSIS — N83201 Unspecified ovarian cyst, right side: Secondary | ICD-10-CM

## 2018-03-12 NOTE — Progress Notes (Signed)
HPI: Patient is a 67 y.o. with No LMP recorded. Patient is postmenopausal., presents today for a problem visit.  She complains of recent findings of Right ovarion cyst by CT - Pelvis.  Pt has had symptoms of achy type pain in RLQ,  no bloating nausea or weight changes.  She also sufers from [place] and has several other medical conscenrs that overlap these sx's of pain.  Marland Kitchen  Previous evaluation: 2014. Prior Diagnosis: ovarian cyst L>R then; monitored w normal CA125 and decreasing size over time.  ALso had laparoscopy for ovarian cyst 20 years ago.. Previous Treatment: none other than prior surgery.  PMHx: She  has a past medical history of Allergy, Anxiety, Aspiration pneumonia ([place]), Depression, Frequent headaches, GERD (gastroesophageal reflux disease), History of chicken pox, History of colon polyps, migraines, Multiple sclerosis ([place]), and PONV (postoperative nausea and vomiting). Also,  has a past surgical history that includes Gallbladder surgery (2008); Spine surgery (2014); Foot surgery (2015); Hardware Removal (Left, 02/14/2016); Cholecystectomy; Back surgery; Hernia repair; and Colonoscopy with propofol (N/A, 05/18/2017)., family history includes Arthritis in her mother; Diabetes in her maternal grandfather; Heart disease in her maternal grandfather; Hyperlipidemia in her father; Hypertension in her father and mother; Kidney disease in her paternal grandmother; Macular degeneration in her mother.,  reports that she has never smoked. She has never used smokeless tobacco. She reports that she does not drink alcohol or use drugs.  She has a current medication list which includes the following prescription(s): acetaminophen, alprazolam, aripiprazole, azelastine, azithromycin, b complex vitamins, butalbital-acetaminophen-caffeine, calcium-phosphorus-vitamin d, cholecalciferol, co q10, dalfampridine, fluticasone, magnesium oxide, mirtazapine, multiple vitamins-minerals, ocrelizumab, remeron, sertraline, and  sodium chloride, and the following Facility-Administered Medications: sodium chloride and acetaminophen. Also, is allergic to asa [aspirin]; buspirone; levofloxacin; lexapro [escitalopram oxalate]; motrin [ibuprofen]; soy allergy; ceftin [cefuroxime axetil]; penicillins; and sulfa antibiotics.  Review of Systems  Constitutional: Negative for chills, fever and malaise/fatigue.  HENT: Negative for congestion, sinus pain and sore throat.   Eyes: Negative for blurred vision and pain.  Respiratory: Negative for cough and wheezing.   Cardiovascular: Negative for chest pain and leg swelling.  Gastrointestinal: Negative for abdominal pain, constipation, diarrhea, heartburn, nausea and vomiting.  Genitourinary: Negative for dysuria, frequency, hematuria and urgency.  Musculoskeletal: Negative for back pain, joint pain, myalgias and neck pain.  Skin: Negative for itching and rash.  Neurological: Negative for dizziness, tremors and weakness.  Endo/Heme/Allergies: Does not bruise/bleed easily.  Psychiatric/Behavioral: Negative for depression. The patient is not nervous/anxious and does not have insomnia.    Objective: BP 140/90   Ht 5\' 3"  (1.6 m)   Wt 172 lb (78 kg)   BMI 30.47 kg/m  Physical Exam  Constitutional: She is oriented to person, place, and time. She appears well-developed and well-nourished. No distress.  Abdominal: Soft. Normal appearance and bowel sounds are normal. There is tenderness in the right lower quadrant. There is no rigidity, no rebound, no guarding, no tenderness at McBurney's point and negative Murphy's sign.  Mild RLQ T  Musculoskeletal: Normal range of motion.  Neurological: She is alert and oriented to person, place, and time.  Skin: Skin is warm and dry.  Psychiatric: She has a normal mood and affect.  Vitals reviewed.  ASSESSMENT/PLAN:     Ovarian cyst, right    -  Primary   Relevant Orders   US PELVIS TRANSVANGINAL NON-OB (TV ONLY)    Concerns for cancer low  and discussed Will characterize by [place] as CT as limited information and value  Barnett Applebaum, MD, [facility], [facility] 03/12/2018  4:46 PM

## 2018-03-12 NOTE — Telephone Encounter (Addendum)
Patient advised of below and verbalized understanding.   She denied having any urinary symptoms at this time Advised her if she wanted to come in and repeat urine in office she could . Will discuss with gyn today at appointment .

## 2018-03-12 NOTE — Patient Instructions (Signed)
Ovarian Cyst An ovarian cyst is a fluid-filled sac that forms on an ovary. The ovaries are small organs that produce eggs in women. Various types of cysts can form on the ovaries. Some may cause symptoms and require treatment. Most ovarian cysts go away on their own, are not cancerous (are benign), and do not cause problems. Common types of ovarian cysts include:  Functional (follicle) cysts. ? Occur during the menstrual cycle, and usually go away with the next menstrual cycle if you do not get pregnant. ? Usually cause no symptoms.  Endometriomas. ? Are cysts that form from the tissue that lines the uterus (endometrium). ? Are sometimes called "chocolate cysts" because they become filled with blood that turns brown. ? Can cause pain in the lower abdomen during intercourse and during your period.  Cystadenoma cysts. ? Develop from cells on the outside surface of the ovary. ? Can get very large and cause lower abdomen pain and pain with intercourse. ? Can cause severe pain if they twist or break open (rupture).  Dermoid cysts. ? Are sometimes found in both ovaries. ? May contain different kinds of body tissue, such as skin, teeth, hair, or cartilage. ? Usually do not cause symptoms unless they get very big.  Theca lutein cysts. ? Occur when too much of a certain hormone (human chorionic gonadotropin) is produced and overstimulates the ovaries to produce an egg. ? Are most common after having procedures used to assist with the conception of a baby (in vitro fertilization).  What are the causes? Ovarian cysts may be caused by:  Ovarian hyperstimulation syndrome. This is a condition that can develop from taking fertility medicines. It causes multiple large ovarian cysts to form.  Polycystic ovarian syndrome (PCOS). This is a common hormonal disorder that can cause ovarian cysts, as well as problems with your period or fertility.  What increases the risk? The following factors may make  you more likely to develop ovarian cysts:  Being overweight or obese.  Taking fertility medicines.  Taking certain forms of hormonal birth control.  Smoking.  What are the signs or symptoms? Many ovarian cysts do not cause symptoms. If symptoms are present, they may include:  Pelvic pain or pressure.  Pain in the lower abdomen.  Pain during sex.  Abdominal swelling.  Abnormal menstrual periods.  Increasing pain with menstrual periods.  How is this diagnosed? These cysts are commonly found during a routine pelvic exam. You may have tests to find out more about the cyst, such as:  Ultrasound.  X-ray of the pelvis.  CT scan.  MRI.  Blood tests.  How is this treated? Many ovarian cysts go away on their own without treatment. Your health care provider may want to check your cyst regularly for 2-3 months to see if it changes. If you are in menopause, it is especially important to have your cyst monitored closely because menopausal women have a higher rate of ovarian cancer. When treatment is needed, it may include:  Medicines to help relieve pain.  A procedure to drain the cyst (aspiration).  Surgery to remove the whole cyst.  Hormone treatment or birth control pills. These methods are sometimes used to help dissolve a cyst.  Follow these instructions at home:  Take over-the-counter and prescription medicines only as told by your health care provider.  Do not drive or use heavy machinery while taking prescription pain medicine.  Get regular pelvic exams and Pap tests as often as told by your health care   provider.  Return to your normal activities as told by your health care provider. Ask your health care provider what activities are safe for you.  Do not use any products that contain nicotine or tobacco, such as cigarettes and e-cigarettes. If you need help quitting, ask your health care provider.  Keep all follow-up visits as told by your health care provider.  This is important. Contact a health care provider if:  Your periods are late, irregular, or painful, or they stop.  You have pelvic pain that does not go away.  You have pressure on your bladder or trouble emptying your bladder completely.  You have pain during sex.  You have any of the following in your abdomen: ? A feeling of fullness. ? Pressure. ? Discomfort. ? Pain that does not go away. ? Swelling.  You feel generally ill.  You become constipated.  You lose your appetite.  You develop severe acne.  You start to have more body hair and facial hair.  You are gaining weight or losing weight without changing your exercise and eating habits.  You think you may be pregnant. Get help right away if:  You have abdominal pain that is severe or gets worse.  You cannot eat or drink without vomiting.  You suddenly develop a fever.  Your menstrual period is much heavier than usual. This information is not intended to replace advice given to you by your health care provider. Make sure you discuss any questions you have with your health care provider. Document Released: 09/29/2005 Document Revised: 04/18/2016 Document Reviewed: 03/02/2016 Elsevier Interactive Patient Education  2018 Elsevier Inc.  

## 2018-03-12 NOTE — Addendum Note (Signed)
Addended by: Leeanne Rio on: 03/12/2018 05:07 PM   Modules accepted: Orders

## 2018-03-18 ENCOUNTER — Other Ambulatory Visit: Payer: Self-pay | Admitting: *Deleted

## 2018-03-18 DIAGNOSIS — G35 Multiple sclerosis: Secondary | ICD-10-CM

## 2018-03-18 DIAGNOSIS — R9089 Other abnormal findings on diagnostic imaging of central nervous system: Secondary | ICD-10-CM

## 2018-03-18 NOTE — Telephone Encounter (Signed)
Pt's husband request a call back from Rn to discuss the appt at [facility].

## 2018-03-18 NOTE — Telephone Encounter (Signed)
Spoke with Mr. Ligman. He sts. pt. had a 2nd opinion at [facility] this wk and the physician there agreed with [name] , that pt. likely does not have MS, and rec. LP, f/u MRI brain and c-spine to assess for MS changes.  Pt. would like to proceed with LP and f/u appt. to discuss results.  LP ordered, f/u appt. given in July/fim

## 2018-03-18 NOTE — Progress Notes (Signed)
LP ordered, with CSF labs to r/o MS/fim

## 2018-03-19 ENCOUNTER — Other Ambulatory Visit: Payer: Self-pay | Admitting: Internal Medicine

## 2018-03-19 DIAGNOSIS — Z1231 Encounter for screening mammogram for malignant neoplasm of breast: Secondary | ICD-10-CM

## 2018-03-22 NOTE — Telephone Encounter (Signed)
Spoke with Carly [name] and explained [name] will order MRI's at pt's pending appt.   He asked if we have results of the [employer] tests back yet, but we do not.  He wanted me to review Dr. Janalee Dane notes with him but I do not have access to them at this time and he doesn't either./fim

## 2018-03-22 NOTE — Telephone Encounter (Signed)
Pt and pts husband requesting a call to discuss putting in an order for the pts MRI. Please call to advise

## 2018-03-23 ENCOUNTER — Telehealth: Payer: Self-pay | Admitting: Neurology

## 2018-03-23 NOTE — Telephone Encounter (Signed)
Kathlee Nations called back to inform they want to know if pt can take 4 mg of Medrol dose pak please call [phone] xt [phone]

## 2018-03-23 NOTE — Telephone Encounter (Signed)
Kathlee Nations with [facility] calling to see if it is alright for the patient to take Prednisone. She has LP scheduled for next Monday. Please call and advise. Kathlee Nations can be reached at [phone] Ext.[phone].

## 2018-03-23 NOTE — Telephone Encounter (Signed)
LMOM for Carly [name] to call back with dose of Prednisone pt. would be taking/fim

## 2018-03-24 ENCOUNTER — Telehealth: Payer: Self-pay | Admitting: Neurology

## 2018-03-24 ENCOUNTER — Other Ambulatory Visit: Payer: Self-pay | Admitting: *Deleted

## 2018-03-24 ENCOUNTER — Telehealth: Payer: Self-pay | Admitting: *Deleted

## 2018-03-24 DIAGNOSIS — G35 Multiple sclerosis: Secondary | ICD-10-CM

## 2018-03-24 DIAGNOSIS — G959 Disease of spinal cord, unspecified: Secondary | ICD-10-CM

## 2018-03-24 NOTE — Telephone Encounter (Signed)
Have spoken with husband regarding this.  RAS will eval and order mri's at pending ov.  LP has been ordered/fim

## 2018-03-24 NOTE — Telephone Encounter (Signed)
LMOM for Carly [name] that it is ok for pt. to take Medrol 4mg  dose pk/fim

## 2018-03-24 NOTE — Telephone Encounter (Signed)
Patient's husband is calling. She saw Dr. Dorthea Cove at [facility] on June 3 for a second opinion. Dr. Dorthea Cove requests [name]  put in a new order for the patient to have MRI's of neck and spine. Please call and discuss.

## 2018-03-24 NOTE — Telephone Encounter (Signed)
[employer] order sent to GI. No auth they will reach out to the pt to schedule.  °

## 2018-03-24 NOTE — Telephone Encounter (Signed)
Spoke with Carly [name] who sts. he spoke with Dr. Delane Ginger in [place] again, and he wants [name] to order MRI's.  RAS reviewed Dr. Robb Matar note and will order MRI cervical and thoracic spines, with and without contrast/fim

## 2018-03-25 ENCOUNTER — Ambulatory Visit (INDEPENDENT_AMBULATORY_CARE_PROVIDER_SITE_OTHER): Payer: Medicare Other

## 2018-03-25 ENCOUNTER — Encounter: Payer: Self-pay | Admitting: Obstetrics & Gynecology

## 2018-03-25 ENCOUNTER — Ambulatory Visit: Payer: Medicare Other | Admitting: Obstetrics & Gynecology

## 2018-03-25 ENCOUNTER — Other Ambulatory Visit: Payer: Medicare Other

## 2018-03-25 VITALS — BP 130/90 | Ht 63.0 in | Wt 164.0 lb

## 2018-03-25 DIAGNOSIS — N83201 Unspecified ovarian cyst, right side: Secondary | ICD-10-CM

## 2018-03-25 NOTE — Progress Notes (Signed)
  HPI: Pt noted to have cyst on ovary by CT.  Denies pain, bloating, weight changes, other sx's related to pelvis of cysts.  Ultrasound demonstrates cyst seen 1 cm right ovary, simple, and no FF  These findings are  non-worrisome  PMHx: She  has a past medical history of Allergy, Anxiety, Aspiration pneumonia ([place]), Depression, Frequent headaches, GERD (gastroesophageal reflux disease), History of chicken pox, History of colon polyps, migraines, Multiple sclerosis ([place]), and PONV (postoperative nausea and vomiting). Also,  has a past surgical history that includes Gallbladder surgery (2008); Spine surgery (2014); Foot surgery (2015); Hardware Removal (Left, 02/14/2016); Cholecystectomy; Back surgery; Hernia repair; and Colonoscopy with propofol (N/A, 05/18/2017)., family history includes Arthritis in her mother; Diabetes in her maternal grandfather; Heart disease in her maternal grandfather; Hyperlipidemia in her father; Hypertension in her father and mother; Kidney disease in her paternal grandmother; Macular degeneration in her mother.,  reports that she has never smoked. She has never used smokeless tobacco. She reports that she does not drink alcohol or use drugs.  She has a current medication list which includes the following prescription(s): acetaminophen, alprazolam, aripiprazole, azelastine, azithromycin, b complex vitamins, butalbital-acetaminophen-caffeine, calcium-phosphorus-vitamin d, cholecalciferol, co q10, dalfampridine, fluticasone, magnesium oxide, mirtazapine, multiple vitamins-minerals, ocrelizumab, remeron, sertraline, and sodium chloride, and the following Facility-Administered Medications: sodium chloride and acetaminophen. Also, is allergic to asa [aspirin]; buspirone; levofloxacin; lexapro [escitalopram oxalate]; motrin [ibuprofen]; soy allergy; ceftin [cefuroxime axetil]; penicillins; and sulfa antibiotics.  Review of Systems  All other systems reviewed and are  negative.  Objective: BP 130/90   Ht 5\' 3"  (1.6 m)   Wt 164 lb (74.4 kg)   BMI 29.05 kg/m   Physical examination Constitutional NAD, Conversant  Skin No rashes, lesions or ulceration.   Extremities: Moves all appropriately.  Normal ROM for age. No lymphadenopathy.  Neuro: Grossly intact  Psych: Oriented to PPT.  Normal mood. Normal affect.   Assessment:  Ovarian cyst, right Monitor for sx's.  No need for removal.  Low liklihood for cancer. F/u based on sx's; [place] 2 years if no sx's for re-check.  A total of 15 minutes were spent face-to-face with the patient during this encounter and over half of that time dealt with counseling and coordination of care.  Barnett Applebaum, MD, [facility], [facility] 03/25/2018  3:56 PM

## 2018-03-29 ENCOUNTER — Ambulatory Visit
Admission: RE | Admit: 2018-03-29 | Discharge: 2018-03-29 | Disposition: A | Discharge status: Home / Self Care | Payer: Medicare Other | Source: Ambulatory Visit | Attending: Neurology | Admitting: Neurology

## 2018-03-29 DIAGNOSIS — R9089 Other abnormal findings on diagnostic imaging of central nervous system: Secondary | ICD-10-CM

## 2018-03-29 DIAGNOSIS — G35 Multiple sclerosis: Secondary | ICD-10-CM

## 2018-03-29 NOTE — Discharge Instructions (Signed)

## 2018-03-29 NOTE — Progress Notes (Signed)
Blood obtained from pt's L AC for lumbar puncture lab work. Pt tolerated procedure well. Site is unremarkable.

## 2018-03-30 ENCOUNTER — Telehealth: Payer: Self-pay | Admitting: Neurology

## 2018-03-30 ENCOUNTER — Ambulatory Visit: Payer: Self-pay | Admitting: Urology

## 2018-03-30 NOTE — Telephone Encounter (Signed)
Scott@ [facility] called to find out if labs were ordered from Dr Felecia Shelling if so please call [phone] please reference# [id number]

## 2018-03-30 NOTE — Telephone Encounter (Signed)
Spoke with Carly [name] at [place]. had LP at [facility] yesterday, and csf was sent to [facility] for testing. [facility] received demographic info, but not what tests were to be completed.   Imaging has a specific sheet they forward lab orders on.  I have given Carly [name] the phone# for [place] and will also ask Raquel Sarna to reach out to the appropriate person at [facility] and ask them to send lab orders to [facility]/fim

## 2018-03-30 NOTE — Telephone Encounter (Signed)
I called and spoke with Danae Chen at [facility] and gave verbal orders for  cell ct. with diff (quest code 398, glucose (quest code 468), IgG index (IgG Synthesis rate0 (quest code 7598), Oligoclonal bands plus serum (quest code 674), protein (quest code 755), VDRL (quest code 6659).  I also faxed these same orders to [place], fax# [phone], with fax confirmation received/fim

## 2018-03-31 ENCOUNTER — Other Ambulatory Visit: Payer: Medicare Other

## 2018-04-01 ENCOUNTER — Other Ambulatory Visit: Payer: Medicare Other

## 2018-04-02 ENCOUNTER — Ambulatory Visit
Admission: RE | Admit: 2018-04-02 | Discharge: 2018-04-02 | Disposition: A | Discharge status: Home / Self Care | Payer: Medicare Other | Source: Ambulatory Visit | Attending: Neurology | Admitting: Neurology

## 2018-04-02 DIAGNOSIS — G959 Disease of spinal cord, unspecified: Secondary | ICD-10-CM

## 2018-04-02 MED ORDER — GADOBENATE DIMEGLUMINE 529 MG/ML IV SOLN
15.0000 mL | Freq: Once | INTRAVENOUS | Status: AC | PRN
  Administered 2018-04-02: 15 mL via INTRAVENOUS

## 2018-04-06 LAB — CSF CELL COUNT WITH DIFFERENTIAL
RBC Count, CSF: 3 cells/uL (ref 0–10)
WBC, CSF: 1 cells/uL (ref 0–5)

## 2018-04-06 LAB — CNS IGG SYNTHESIS RATE, CSF+BLOOD
ALBUMIN SERUM: 4.6 g/dL (ref 3.2–4.6)
ALBUMIN,CSF: 19.9 mg/dL (ref 8.0–42.0)
CNS-IgG Synthesis Rate: 2.7 mg/24 h (ref <=3.3)
IGG TOTAL CSF: 2 mg/dL (ref 0.8–7.7)
IGG-INDEX: 0.85 — AB (ref <0.66)
IgG (Immunoglobin G), Serum: 541 mg/dL — ABNORMAL LOW (ref 694–1618)

## 2018-04-06 LAB — PROTEIN, CSF: Total Protein, CSF: 36 mg/dL (ref 15–60)

## 2018-04-06 LAB — GLUCOSE, CSF: GLUCOSE CSF: 71 mg/dL (ref 40–80)

## 2018-04-06 LAB — VDRL, CSF: SYPHILIS VDRL QUANT CSF: NONREACTIVE

## 2018-04-06 LAB — OLIGOCLONAL BANDS, CSF + SERM

## 2018-04-07 ENCOUNTER — Telehealth: Payer: Self-pay | Admitting: Neurology

## 2018-04-07 NOTE — Telephone Encounter (Signed)
Patient requesting MRI and LP results.

## 2018-04-07 NOTE — Telephone Encounter (Signed)
I discussed the results of the cerebrospinal fluid and the MRIs of the spine with Mr. and Mrs. Mahrt.  The MRI of the cervical spine shows 4 lesions consistent with MS.  The MRI of the thoracic spine also shows 4 lesions consistent with MS.  She has some degenerative changes in the cervical spine with mild progression compared to the 2012 MRI.  There are no changes in the number or appearance of the cervical spinal cord lesions between 2012 and this week.  The CSF showed > 5 oligoclonal bands and the IgG index was elevated.  The CADASIL test was negative.  Taken together, I do believe that she has multiple sclerosis.  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  We discussed that the only FDA approved treatment for primary progressive MS is Ocrevus.  However, because she has not noted any stability over the last 2 years (feels walking is significantly worse) and there are risks associated with this use I would recommend that we stop the ocrelizumab at this time.  She has an appointment to see me in 2 weeks and I will go over the MRI with her at that time.

## 2018-04-14 ENCOUNTER — Ambulatory Visit: Payer: Medicare Other

## 2018-04-14 ENCOUNTER — Ambulatory Visit: Payer: Medicare Other | Admitting: Hematology and Oncology

## 2018-04-19 ENCOUNTER — Ambulatory Visit: Payer: Self-pay | Admitting: Neurology

## 2018-04-20 ENCOUNTER — Telehealth: Payer: Self-pay | Admitting: Neurology

## 2018-04-20 NOTE — Telephone Encounter (Signed)
Spoke with Mr. Lepage and explained pt. does not need to wean off of Ocrevus.  Her last infusion was due a couple of wks ago, so most of it should be out of her system anyway.  Pt had to cancel her appt. with RAS yesterday, but has r/s for later this month, and can discuss further at appt.  She has a pending appt. with her psychiatrist to discuss depression, and I have advised that if he believes she has SI/HI, he should take her to the ER for eval.  He verbalized understanding of same/fim

## 2018-04-20 NOTE — Telephone Encounter (Signed)
Pt's husband Bill/DPR is wanting to discuss how to wean her off the ocrevus and what to do about pain and side effects. He also said she is extremely depressed. Please call to advise

## 2018-04-21 ENCOUNTER — Ambulatory Visit: Payer: Medicare Other | Admitting: Internal Medicine

## 2018-04-27 ENCOUNTER — Ambulatory Visit: Payer: Self-pay | Admitting: Neurology

## 2018-04-30 ENCOUNTER — Other Ambulatory Visit: Payer: Self-pay | Admitting: Internal Medicine

## 2018-05-04 ENCOUNTER — Ambulatory Visit: Payer: Self-pay | Admitting: Urology

## 2018-05-06 ENCOUNTER — Ambulatory Visit
Admission: RE | Admit: 2018-05-06 | Discharge: 2018-05-06 | Disposition: A | Discharge status: Home / Self Care | Payer: Medicare Other | Source: Ambulatory Visit | Attending: Internal Medicine | Admitting: Internal Medicine

## 2018-05-06 DIAGNOSIS — Z1231 Encounter for screening mammogram for malignant neoplasm of breast: Secondary | ICD-10-CM | POA: Diagnosis not present

## 2018-05-10 ENCOUNTER — Ambulatory Visit: Payer: Self-pay | Admitting: Neurology

## 2018-06-16 ENCOUNTER — Ambulatory Visit: Payer: Self-pay | Admitting: Internal Medicine

## 2018-06-16 ENCOUNTER — Telehealth: Payer: Self-pay

## 2018-06-16 NOTE — Telephone Encounter (Signed)
Spoken to patient informed her we were unable to accommodate her appointment change for today. Patient stated that was fine she can come in tomorrow.

## 2018-06-16 NOTE — Telephone Encounter (Signed)
Pt called with C/O diarrhea after taking dulcolax for constipation.  Pt has office visit tomorrow  with NP L. Guse at 0830 but would like the appointment today. Pt states she took two doses of dulcolax yesterday. Had watery diarrhea x 3 and today has had cramping and 1 mushy stool. Pt report having ongoing nausea after stopping Rexulti over the past month. Was unable to schedule with [place] and was offered a search with another [employer] office but pt declined. Will keep scheduled appointment tomorrow. Pt advised to seek immediate attention for worsening of symptoms. Verbalized understanding of all instructions. Reason for Disposition . [1] MODERATE diarrhea (e.g., 4-6 times / day more than normal) AND [2] present > 48 hours (2 days)    Stools 1-3 times today Pt requesting to see PCP today  Answer Assessment - Initial Assessment Questions 1. DIARRHEA SEVERITY: "How bad is the diarrhea?" "How many extra stools have you had in the past 24 hours than normal?"    - NO DIARRHEA (SCALE 0)   - MILD (SCALE 1-3): Few loose or mushy BMs; increase of 1-3 stools over normal daily number of stools; mild increase in ostomy output.   -  MODERATE (SCALE 4-7): Increase of 4-6 stools daily over normal; moderate increase in ostomy output. * SEVERE (SCALE 8-10; OR 'WORST POSSIBLE'): Increase of 7 or more stools daily over normal; moderate increase in ostomy output; incontinence.     mild 2. ONSET: "When did the diarrhea begin?"      Sunday after dulcolax 3. BM CONSISTENCY: "How loose or watery is the diarrhea?"      Mushy yesterday watery 4. VOMITING: "Are you also vomiting?" If so, ask: "How many times in the past 24 hours?"      No just nausea  5. ABDOMINAL PAIN: "Are you having any abdominal pain?" If yes: "What does it feel like?" (e.g., crampy, dull, intermittent, constant)      Lower abdomine cramping 6. ABDOMINAL PAIN SEVERITY: If present, ask: "How bad is the pain?"  (e.g., Scale 1-10; mild, moderate, or  severe)   - MILD (1-3): doesn't interfere with normal activities, abdomen soft and not tender to touch    - MODERATE (4-7): interferes with normal activities or awakens from sleep, tender to touch    - SEVERE (8-10): excruciating pain, doubled over, unable to do any normal activities       Mild but constant 7. ORAL INTAKE: If vomiting, "Have you been able to drink liquids?" "How much fluids have you had in the past 24 hours?"     Taking liquid 8. HYDRATION: "Any signs of dehydration?" (e.g., dry mouth [not just dry lips], too weak to stand, dizziness, new weight loss) "When did you last urinate?"     Dizzy when I stand 9. EXPOSURE: "Have you traveled to a foreign country recently?" "Have you been exposed to anyone with diarrhea?" "Could you have eaten any food that was spoiled?"     no 10. ANTIBIOTIC USE: "Are you taking antibiotics now or have you taken antibiotics in the past 2 months?"       no 11. OTHER SYMPTOMS: "Do you have any other symptoms?" (e.g., fever, blood in stool)      no  Protocols used: DIARRHEA-A-AH

## 2018-06-16 NOTE — Telephone Encounter (Signed)
Copied from [place] [id number]. Topic: Appointment Scheduling - Scheduling Inquiry for Clinic >> Jun 16, 2018  8:41 AM Sheran Luz wrote: Reason for CRM: Pt called stating that she does not think she can wait until her appointment tomorrow to be seen. Pt wanted to know if there is any way to work her in with any of the providers today. Pt has an appointment scheduled tomorrow with Philis Nettle. Pt is requesting a call back at [phone]. >> Jun 16, 2018 10:52 AM Bud Face F wrote: Waiting on response from the provider.

## 2018-06-17 ENCOUNTER — Ambulatory Visit: Payer: Medicare Other | Admitting: Family Medicine

## 2018-06-17 ENCOUNTER — Encounter: Payer: Self-pay | Admitting: Family Medicine

## 2018-06-17 VITALS — BP 138/88 | HR 106 | Temp 98.7°F | Ht 63.0 in | Wt 172.8 lb

## 2018-06-17 DIAGNOSIS — R197 Diarrhea, unspecified: Secondary | ICD-10-CM

## 2018-06-17 DIAGNOSIS — Z23 Encounter for immunization: Secondary | ICD-10-CM

## 2018-06-17 DIAGNOSIS — K5900 Constipation, unspecified: Secondary | ICD-10-CM | POA: Diagnosis not present

## 2018-06-17 LAB — COMPREHENSIVE METABOLIC PANEL
ALT: 24 U/L (ref 0–35)
AST: 24 U/L (ref 0–37)
Albumin: 4.7 g/dL (ref 3.5–5.2)
Alkaline Phosphatase: 85 U/L (ref 39–117)
BILIRUBIN TOTAL: 0.4 mg/dL (ref 0.2–1.2)
BUN: 8 mg/dL (ref 6–23)
CALCIUM: 9.7 mg/dL (ref 8.4–10.5)
CHLORIDE: 103 meq/L (ref 96–112)
CO2: 27 meq/L (ref 19–32)
CREATININE: 0.7 mg/dL (ref 0.40–1.20)
GFR: 88.47 mL/min (ref >60.00)
GLUCOSE: 123 mg/dL — AB (ref 70–99)
Potassium: 4 mEq/L (ref 3.5–5.1)
Sodium: 138 mEq/L (ref 135–145)
TOTAL PROTEIN: 7.3 g/dL (ref 6.0–8.3)

## 2018-06-17 LAB — CBC
HCT: 43.8 % (ref 36.0–46.0)
HEMOGLOBIN: 14.7 g/dL (ref 12.0–15.0)
MCHC: 33.5 g/dL (ref 30.0–36.0)
MCV: 84.9 fl (ref 78.0–100.0)
PLATELETS: 324 10*3/uL (ref 150.0–400.0)
RBC: 5.16 Mil/uL — ABNORMAL HIGH (ref 3.87–5.11)
RDW: 14.5 % (ref 11.5–15.5)
WBC: 6.4 10*3/uL (ref 4.0–10.5)

## 2018-06-17 NOTE — Progress Notes (Signed)
Subjective:    Patient ID: Carly [name], female    DOB: [birth year], 67 y.o.   MRN: [id number]  HPI  Patient presents to clinic c/o constipation for 3-4 weeks, then took dulcolax and has had diarrhea off and on for 4 days.  Patient states she has had issues with constipation off and on for months, was taking a probiotic supplement and this seemed to help regulate system.  Also 4 weeks ago patient stopped taking Rexulti, states she did not wean down off of this medication just stopped taking cold [place].  At the same time she was started on Deplin, which is a medication classified as a medical food meant to supplement Zoloft for better effect.  She has also had nausea for the past 4 weeks, but is unsure of her to related to constipation, or medication changes.  She does take Colace 2 times daily regularly.  Patient Active Problem List   Diagnosis Date Noted  . 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 Depression with anxiety 02/04/2018  . Cognitive and behavioral changes 02/04/2018  . Constipation 11/17/2017  . Lower extremity edema 01/13/2016  . Bilateral ovarian cysts 10/16/2015  . Health care maintenance 10/16/2015  . Colon polyp 01/25/2015  . Bone/cartilage disorder 01/25/2015  . Headache 11/18/2014  . Depression 11/18/2014  . Greater tuberosity of humerus fracture 11/18/2014  . Multiple sclerosis ([place]) 11/18/2014  . Unsteady gait 11/18/2014  . Dizziness 11/18/2014  . Inconclusive mammogram 03/15/2013  . Back ache 09/06/2012  . BP (high blood pressure) 09/06/2012  . Back pain 09/06/2012  . Hypercholesterolemia 01/28/2012  . Climacteric 01/28/2012  . Routine general medical examination at a health care facility 01/28/2012  . Avitaminosis D 01/28/2012   Social History   Tobacco Use  . Smoking status: Never Smoker  . Smokeless tobacco: Never Used  Substance Use Topics  . Alcohol use: No    Alcohol/week: 0.0 standard drinks   Review of Systems  Constitutional: Negative for  chills, fatigue and fever.  HENT: Negative for congestion, ear pain, sinus pain and sore throat.   Eyes: Negative.   Respiratory: Negative for cough, shortness of breath and wheezing.   Cardiovascular: Negative for chest pain, palpitations and leg swelling.  Gastrointestinal: +constipation, diarrhea. No vomiting.  Genitourinary: Negative for dysuria, frequency and urgency.  Musculoskeletal: Negative for arthralgias and myalgias.  Skin: Negative for color change, pallor and rash.  Neurological: Negative for syncope, light-headedness and headaches.  Psychiatric/Behavioral: The patient is not nervous/anxious.       Objective:   Physical Exam  Constitutional: She is oriented to person, place, and time. She appears well-developed and well-nourished. No distress.  HENT:  Head: Normocephalic and atraumatic.  Eyes: Conjunctivae and EOM are normal. No scleral icterus.  Neck: Neck supple. No tracheal deviation present.  Cardiovascular: Normal rate and regular rhythm.  Pulmonary/Chest: Effort normal and breath sounds normal. No respiratory distress.  Abdominal: Soft. Bowel sounds are normal. She exhibits no distension. There is no tenderness. There is no rebound and no guarding.  Musculoskeletal: She exhibits no edema.  Neurological: She is alert and oriented to person, place, and time.  Uses wheeled walker with seat to walk/or sit in and be pushed in order to get around.   Skin: Skin is warm and dry. No pallor.  Psychiatric: She has a normal mood and affect. Her behavior is normal.  Nursing note and vitals reviewed.   Vitals:   06/17/18 0841  BP: 138/88  Pulse: (!) 106  Temp: 98.7  F (37.1 C)  SpO2: 93%      Assessment & Plan:    Constipation - patient will continue Colace 2 times daily.  She will also add Metamucil at least every other day, encouraged to start daily, but she is fearful of it causing too much bloating so is agreeable to every other day.  Patient also given  information about constipation and foods to eat to help improve bowel movements, & encouraged to increase water intake.  Patient's husband states they have supply of Metamucil at home and do not require a new prescription at this time.  Diarrhea - I suspect episodes of diarrhea the past few days are directly related to use of Dulcolax.  CBC and CMP lab work collected in clinic today.  Nausea - unclear if nausea is related to medication changes constipation.  Patient advised that often times medications will have stomach upset as a side effect, but this tends to improve after the first few weeks on the medication.  If nausea does not improve after next few weeks, I would recommend discussing this artifact with prescriber of new medication Deplin.  Also discussed using ginger ale, or eating saltine crackers to calm nausea  Keep upcoming follow-up visit in October 2019 as already scheduled.  You may return to clinic sooner if current symptoms persist or worsen.

## 2018-06-17 NOTE — Patient Instructions (Signed)
Continue colace 2 times per day  Take metamucil at least every other day  Reserve dulcolax for only as needed when metamucil and colace have not helped to keep stools soft   Constipation, Adult Constipation is when a person:  Poops (has a bowel movement) fewer times in a week than normal.  Has a hard time pooping.  Has poop that is dry, hard, or bigger than normal.  Follow these instructions at home: Eating and drinking   Eat foods that have a lot of fiber, such as: ? Fresh fruits and vegetables. ? Whole grains. ? Beans.  Eat less of foods that are high in fat, low in fiber, or overly processed, such as: ? [place] fries. ? Hamburgers. ? Cookies. ? Candy. ? Soda.  Drink enough fluid to keep your pee (urine) clear or pale yellow. General instructions  Exercise regularly or as told by your doctor.  Go to the restroom when you feel like you need to poop. Do not hold it in.  Take over-the-counter and prescription medicines only as told by your doctor. These include any fiber supplements.  Do pelvic floor retraining exercises, such as: ? Doing deep breathing while relaxing your lower belly (abdomen). ? Relaxing your pelvic floor while pooping.  Watch your condition for any changes.  Keep all follow-up visits as told by your doctor. This is important. Contact a doctor if:  You have pain that gets worse.  You have a fever.  You have not pooped for 4 days.  You throw up (vomit).  You are not hungry.  You lose weight.  You are bleeding from the anus.  You have thin, pencil-like poop (stool). Get help right away if:  You have a fever, and your symptoms suddenly get worse.  You leak poop or have blood in your poop.  Your belly feels hard or bigger than normal (is bloated).  You have very bad belly pain.  You feel dizzy or you faint. This information is not intended to replace advice given to you by your health care provider. Make sure you discuss any  questions you have with your health care provider. Document Released: 03/17/2008 Document Revised: 04/18/2016 Document Reviewed: 03/19/2016 Elsevier Interactive Patient Education  2018 [employer].

## 2018-06-18 ENCOUNTER — Telehealth: Payer: Self-pay | Admitting: Internal Medicine

## 2018-06-18 NOTE — Telephone Encounter (Signed)
Copied from [place] [id number]. Topic: Quick Communication - See Telephone Encounter >> Jun 18, 2018  4:11 PM Gardiner Ramus wrote: CRM for notification. See Telephone encounter for: 06/18/18. Pt called and stated that she would like somone to give her a call regarding labs done 06/17/18

## 2018-06-18 NOTE — Telephone Encounter (Signed)
Lab results

## 2018-06-22 NOTE — Telephone Encounter (Signed)
Patient was given lab results

## 2018-07-06 ENCOUNTER — Ambulatory Visit: Payer: Self-pay | Admitting: Urology

## 2018-08-05 ENCOUNTER — Encounter: Payer: Medicare Other | Admitting: Internal Medicine

## 2018-08-09 ENCOUNTER — Encounter: Payer: Medicare Other | Admitting: Internal Medicine

## 2018-08-09 LAB — BASIC METABOLIC PANEL
BUN: 7 (ref 4–21)
Creatinine: 0.9 (ref 0.5–1.1)
GLUCOSE: 103
POTASSIUM: 4.2 (ref 3.4–5.3)
SODIUM: 140 (ref 137–147)

## 2018-08-09 LAB — CBC AND DIFFERENTIAL
HCT: 46 (ref 36–46)
Hemoglobin: 14.7 (ref 12.0–16.0)
Platelets: 306 (ref 150–399)
WBC: 7.5

## 2018-08-09 LAB — HEPATIC FUNCTION PANEL
ALT: 20 (ref 7–35)
AST: 21 (ref 13–35)
Alkaline Phosphatase: 74 (ref 25–125)
BILIRUBIN, TOTAL: 0.6

## 2018-08-10 ENCOUNTER — Telehealth: Payer: Self-pay | Admitting: Internal Medicine

## 2018-08-10 NOTE — Telephone Encounter (Signed)
Patient is not currently having symptoms. Advised that Dr. Nicki Reaper is not in the office to give orders for her to come by and drop off a urine. Also advised that if she is having urinary symptoms, I can schedule her for an office visit to see another provider in the office. Patient stated she would call in the morning if she felt she needed to come in for appt.

## 2018-08-10 NOTE — Telephone Encounter (Signed)
Copied from [place] [id number]. Topic: General - Inquiry >> Aug 10, 2018  2:36 PM Selinda Flavin B, NT wrote: Reason for CRM: Patient calling and states that she started Miralax yesterday (08/09/18) and states that she was up and down all night last night going to the bathroom to urinate. States that she does not think it is a UTI, but would like to stop by the office and bring a sample. Please advise. CB#: [phone]

## 2018-08-11 ENCOUNTER — Ambulatory Visit: Payer: Self-pay | Admitting: Urology

## 2018-08-11 ENCOUNTER — Ambulatory Visit: Payer: Medicare Other | Admitting: Internal Medicine

## 2018-08-11 ENCOUNTER — Encounter: Payer: Self-pay | Admitting: Internal Medicine

## 2018-08-11 VITALS — BP 132/78 | HR 93 | Temp 98.2°F | Ht 63.0 in

## 2018-08-11 DIAGNOSIS — R350 Frequency of micturition: Secondary | ICD-10-CM | POA: Diagnosis not present

## 2018-08-11 DIAGNOSIS — N3000 Acute cystitis without hematuria: Secondary | ICD-10-CM | POA: Diagnosis not present

## 2018-08-11 DIAGNOSIS — G35 Multiple sclerosis: Secondary | ICD-10-CM | POA: Diagnosis not present

## 2018-08-11 DIAGNOSIS — R110 Nausea: Secondary | ICD-10-CM | POA: Diagnosis not present

## 2018-08-11 MED ORDER — ONDANSETRON 4 MG PO TBDP: 4.0000 mg | ORAL_TABLET | Freq: Three times a day (TID) | ORAL | 0 refills | Status: DC | PRN

## 2018-08-11 MED ORDER — NITROFURANTOIN MONOHYD MACRO 100 MG PO CAPS: 100.0000 mg | ORAL_CAPSULE | Freq: Two times a day (BID) | ORAL | 0 refills | Status: DC

## 2018-08-11 MED ORDER — CIPROFLOXACIN HCL 250 MG PO TABS: 250.0000 mg | ORAL_TABLET | Freq: Two times a day (BID) | ORAL | 0 refills | Status: DC

## 2018-08-11 NOTE — Addendum Note (Signed)
Addended by: Leeanne Rio on: 08/11/2018 03:09 PM   Modules accepted: Orders

## 2018-08-11 NOTE — Patient Instructions (Addendum)
Broth, crackers, applesauce, toast, bananas, rice plain Stay hydrated water, ginger ale   Urinary Tract Infection, Adult A urinary tract infection (UTI) is an infection of any part of the urinary tract, which includes the kidneys, ureters, bladder, and urethra. These organs make, store, and get rid of urine in the body. UTI can be a bladder infection (cystitis) or kidney infection (pyelonephritis). What are the causes? This infection may be caused by fungi, viruses, or bacteria. Bacteria are the most common cause of UTIs. This condition can also be caused by repeated incomplete emptying of the bladder during urination. What increases the risk? This condition is more likely to develop if:  You ignore your need to urinate or hold urine for long periods of time.  You do not empty your bladder completely during urination.  You wipe back to front after urinating or having a bowel movement, if you are female.  You are uncircumcised, if you are female.  You are constipated.  You have a urinary catheter that stays in place (indwelling).  You have a weak defense (immune) system.  You have a medical condition that affects your bowels, kidneys, or bladder.  You have diabetes.  You take antibiotic medicines frequently or for long periods of time, and the antibiotics no longer work well against certain types of infections (antibiotic resistance).  You take medicines that irritate your urinary tract.  You are exposed to chemicals that irritate your urinary tract.  You are female.  What are the signs or symptoms? Symptoms of this condition include:  Fever.  Frequent urination or passing small amounts of urine frequently.  Needing to urinate urgently.  Pain or burning with urination.  Urine that smells bad or unusual.  Cloudy urine.  Pain in the lower abdomen or back.  Trouble urinating.  Blood in the urine.  Vomiting or being less hungry than normal.  Diarrhea or abdominal  pain.  Vaginal discharge, if you are female.  How is this diagnosed? This condition is diagnosed with a medical history and physical exam. You will also need to provide a urine sample to test your urine. Other tests may be done, including:  Blood tests.  Sexually transmitted disease (STD) testing.  If you have had more than one UTI, a cystoscopy or imaging studies may be done to determine the cause of the infections. How is this treated? Treatment for this condition often includes a combination of two or more of the following:  Antibiotic medicine.  Other medicines to treat less common causes of UTI.  Over-the-counter medicines to treat pain.  Drinking enough water to stay hydrated.  Follow these instructions at home:  Take over-the-counter and prescription medicines only as told by your health care provider.  If you were prescribed an antibiotic, take it as told by your health care provider. Do not stop taking the antibiotic even if you start to feel better.  Avoid alcohol, caffeine, tea, and carbonated beverages. They can irritate your bladder.  Drink enough fluid to keep your urine clear or pale yellow.  Keep all follow-up visits as told by your health care provider. This is important.  Make sure to: ? Empty your bladder often and completely. Do not hold urine for long periods of time. ? Empty your bladder before and after sex. ? Wipe from front to back after a bowel movement if you are female. Use each tissue one time when you wipe. Contact a health care provider if:  You have back pain.  You  have a fever.  You feel nauseous or vomit.  Your symptoms do not get better after 3 days.  Your symptoms go away and then return. Get help right away if:  You have severe back pain or lower abdominal pain.  You are vomiting and cannot keep down any medicines or water. This information is not intended to replace advice given to you by your health care provider. Make sure  you discuss any questions you have with your health care provider. Document Released: 07/09/2005 Document Revised: 03/12/2016 Document Reviewed: 08/20/2015 Elsevier Interactive Patient Education  2018 [place] Diet A bland diet consists of foods that do not have a lot of fat or fiber. Foods without fat or fiber are easier for the body to digest. They are also less likely to irritate your mouth, throat, stomach, and other parts of your gastrointestinal tract. A bland diet is sometimes called a BRAT diet. What is my plan? Your health care provider or dietitian may recommend specific changes to your diet to prevent and treat your symptoms, such as:  Eating small meals often.  Cooking food until it is soft enough to chew easily.  Chewing your food well.  Drinking fluids slowly.  Not eating foods that are very spicy, sour, or fatty.  Not eating citrus fruits, such as oranges and grapefruit.  What do I need to know about this diet?  Eat a variety of foods from the bland diet food list.  Do not follow a bland diet longer than you have to.  Ask your health care provider whether you should take vitamins. What foods can I eat? Grains  Hot cereals, such as cream of wheat. Bread, crackers, or tortillas made from refined white flour. Rice. Vegetables Canned or cooked vegetables. Mashed or boiled potatoes. Fruits Bananas. Applesauce. Other types of cooked or canned fruit with the skin and seeds removed, such as canned peaches or pears. Meats and Other Protein Sources Scrambled eggs. Creamy peanut butter or other nut butters. Lean, well-cooked meats, such as chicken or fish. Tofu. Soups or broths. Dairy Low-fat dairy products, such as milk, cottage cheese, or yogurt. Beverages Water. Herbal tea. Apple juice. Sweets and Desserts Pudding. Custard. Fruit gelatin. Ice cream. Fats and Oils Mild salad dressings. Canola or olive oil. The items listed above may not be a complete  list of allowed foods or beverages. Contact your dietitian for more options. What foods are not recommended? Foods and ingredients that are often not recommended include:  Spicy foods, such as hot sauce or salsa.  Fried foods.  Sour foods, such as pickled or fermented foods.  Raw vegetables or fruits, especially citrus or berries.  Caffeinated drinks.  Alcohol.  Strongly flavored seasonings or condiments.  The items listed above may not be a complete list of foods and beverages that are not allowed. Contact your dietitian for more information. This information is not intended to replace advice given to you by your health care provider. Make sure you discuss any questions you have with your health care provider. Document Released: 01/21/2016 Document Revised: 03/06/2016 Document Reviewed: 10/11/2014 Elsevier Interactive Patient Education  2018 [employer].   Nausea, Adult Nausea is the feeling of an upset stomach or having to vomit. Nausea on its own is not usually a serious concern, but it may be an early sign of a more serious medical problem. As nausea gets worse, it can lead to vomiting. If vomiting develops, or if you are not able to drink  enough fluids, you are at risk of becoming dehydrated. Dehydration can make you tired and thirsty, cause you to have a dry mouth, and decrease how often you urinate. Older adults and people with other diseases or a weak immune system are at higher risk for dehydration. The main goals of treating your nausea are:  To limit repeated nausea episodes.  To prevent vomiting and dehydration.  Follow these instructions at home: Follow instructions from your health care provider about how to care for yourself at home. Eating and drinking Follow these recommendations as told by your health care provider:  Take an oral rehydration solution (ORS). This is a drink that is sold at pharmacies and retail stores.  Drink clear fluids in small amounts as  you are able. Clear fluids include water, ice chips, diluted fruit juice, and low-calorie sports drinks.  Eat bland, easy-to-digest foods in small amounts as you are able. These foods include bananas, applesauce, rice, lean meats, toast, and crackers.  Avoid drinking fluids that contain a lot of sugar or caffeine, such as energy drinks, sports drinks, and soda.  Avoid alcohol.  Avoid spicy or fatty foods.  General instructions  Drink enough fluid to keep your urine clear or pale yellow.  Wash your hands often. If soap and water are not available, use hand sanitizer.  Make sure that all people in your household wash their hands well and often.  Rest at home while you recover.  Take over-the-counter and prescription medicines only as told by your health care provider.  Breathe slowly and deeply when you feel nauseous.  Watch your condition for any changes.  Keep all follow-up visits as told by your health care provider. This is important. Contact a health care provider if:  You have a headache.  You have new symptoms.  Your nausea gets worse.  You have a fever.  You feel light-headed or dizzy.  You vomit.  You cannot keep fluids down. Get help right away if:  You have pain in your chest, neck, arm, or jaw.  You feel extremely weak or you faint.  You have vomit that is bright red or looks like coffee grounds.  You have bloody or black stools or stools that look like tar.  You have a severe headache, a stiff neck, or both.  You have severe pain, cramping, or bloating in your abdomen.  You have a rash.  You have difficulty breathing or are breathing very quickly.  Your heart is beating very quickly.  Your skin feels cold and clammy.  You feel confused.  You have pain when you urinate.  You have signs of dehydration, such as: ? Dark urine, very little, or no urine. ? Cracked lips. ? Dry mouth. ? Sunken eyes. ? Sleepiness. ? Weakness. These symptoms  may represent a serious problem that is an emergency. Do not wait to see if the symptoms will go away. Get medical help right away. Call your local emergency services (911 in the [place] Do not drive yourself to the hospital. This information is not intended to replace advice given to you by your health care provider. Make sure you discuss any questions you have with your health care provider. Document Released: 11/06/2004 Document Revised: 03/03/2016 Document Reviewed: 06/05/2015 Elsevier Interactive Patient Education  2018 [employer].  Constipation, Adult Constipation is when a person has fewer bowel movements in a week than normal, has difficulty having a bowel movement, or has stools that are dry, hard, or larger than normal.  Constipation may be caused by an underlying condition. It may become worse with age if a person takes certain medicines and does not take in enough fluids. Follow these instructions at home: Eating and drinking   Eat foods that have a lot of fiber, such as fresh fruits and vegetables, whole grains, and beans.  Limit foods that are high in fat, low in fiber, or overly processed, such as french fries, hamburgers, cookies, candies, and soda.  Drink enough fluid to keep your urine clear or pale yellow. General instructions  Exercise regularly or as told by your health care provider.  Go to the restroom when you have the urge to go. Do not hold it in.  Take over-the-counter and prescription medicines only as told by your health care provider. These include any fiber supplements.  Practice pelvic floor retraining exercises, such as deep breathing while relaxing the lower abdomen and pelvic floor relaxation during bowel movements.  Watch your condition for any changes.  Keep all follow-up visits as told by your health care provider. This is important. Contact a health care provider if:  You have pain that gets worse.  You have a fever.  You do not have a bowel  movement after 4 days.  You vomit.  You are not hungry.  You lose weight.  You are bleeding from the anus.  You have thin, pencil-like stools. Get help right away if:  You have a fever and your symptoms suddenly get worse.  You leak stool or have blood in your stool.  Your abdomen is bloated.  You have severe pain in your abdomen.  You feel dizzy or you faint. This information is not intended to replace advice given to you by your health care provider. Make sure you discuss any questions you have with your health care provider. Document Released: 06/27/2004 Document Revised: 04/18/2016 Document Reviewed: 03/19/2016 Elsevier Interactive Patient Education  2018 [employer].

## 2018-08-11 NOTE — Progress Notes (Addendum)
Chief Complaint  Patient presents with  . Urinary Frequency   F/u  1. UTI concern sx's Monday/Tues with urinary frequency, back pain lower and lower ab pressure She tried AZO pyridium otc. Denies dysuria   2. Nausea x 2 months with several different med changes off Ocrevus for MS, off remeron tapered off as of 2-3 weeks ago, on Deplin 15 mg qd, Trazadone 50 mg qhs sleep, on Valium 2.5 mg qd  3. MS of note off Ocrevus per MS Dr. Ramond Marrow at [facility] per husband had MS since 1980s but dx'ed in 2012 also there were seeing Dr. Rana Snare.   4. Constipation tried Miralax, Prune juice, Ducloax and mineral oil and she thinks she may have contaminated herself wiping and caused #1    Review of Systems  Gastrointestinal: Positive for abdominal pain and nausea.  Genitourinary: Positive for frequency and urgency. Negative for dysuria.  Neurological:       +MS   Past Medical History:  Diagnosis Date  . Allergy   . Anxiety   . Aspiration pneumonia ([place])   . Depression   . Frequent headaches    H/O  . GERD (gastroesophageal reflux disease)   . History of chicken pox   . History of colon polyps   . Hx of migraines   . Multiple sclerosis ([place])   . PONV (postoperative nausea and vomiting)    Past Surgical History:  Procedure Laterality Date  . BACK SURGERY    . CHOLECYSTECTOMY    . COLONOSCOPY WITH PROPOFOL N/A 05/18/2017   Procedure: COLONOSCOPY WITH PROPOFOL;  Surgeon: [name], MD;  Location: [facility] ENDOSCOPY;  Service: Endoscopy;  Laterality: N/A;  . FOOT SURGERY  2015  . GALLBLADDER SURGERY  2008  . HARDWARE REMOVAL Left 02/14/2016   Procedure: LEFT FOOT REMOVAL DEEP IMPLANT;  Surgeon: [name], MD;  Location: [facility];  Service: Orthopedics;  Laterality: Left;  . HERNIA REPAIR     Inguinal Hernia Repair  . SPINE SURGERY  2014   Family History  Problem Relation Age of Onset  . Arthritis Mother   . Hypertension Mother   . Macular degeneration  Mother   . Hypertension Father   . Hyperlipidemia Father   . Heart disease Maternal Grandfather   . Diabetes Maternal Grandfather   . Kidney disease Paternal Grandmother    Social History   Socioeconomic History  . Marital status: Married    Spouse name: Not on file  . Number of children: Not on file  . Years of education: Not on file  . Highest education level: Not on file  Occupational History  . Not on file  Social Needs  . Financial resource strain: Not on file  . Food insecurity:    Worry: Not on file    Inability: Not on file  . Transportation needs:    Medical: Not on file    Non-medical: Not on file  Tobacco Use  . Smoking status: Never Smoker  . Smokeless tobacco: Never Used  Substance and Sexual Activity  . Alcohol use: No    Alcohol/week: 0.0 standard drinks  . Drug use: No  . Sexual activity: Not Currently  Lifestyle  . Physical activity:    Days per week: Not on file    Minutes per session: Not on file  . Stress: Not on file  Relationships  . Social connections:    Talks on phone: Not on file    Gets together: Not  on file    Attends religious service: Not on file    Active member of club or organization: Not on file    Attends meetings of clubs or organizations: Not on file    Relationship status: Not on file  . Intimate partner violence:    Fear of current or ex partner: Not on file    Emotionally abused: Not on file    Physically abused: Not on file    Forced sexual activity: Not on file  Other Topics Concern  . Not on file  Social History Narrative   Married    Current Meds  Medication Sig  . acetaminophen (TYLENOL) 325 MG tablet Take 650 mg by mouth every 4 (four) hours as needed.  . B Complex Vitamins (VITAMIN B COMPLEX PO) Take by mouth.  . Calcium-Phosphorus-Vitamin D (CITRACAL +D3 PO) Take by mouth. 2 tablets twice a day  . Cholecalciferol (D3-1000 PO) Take by mouth.  . Coenzyme Q10 (CO Q10) 100 MG CAPS Take by mouth.  .  dalfampridine 10 MG TB12 Take 10 mg by mouth every 12 (twelve) hours.  . fluticasone (FLONASE) 50 MCG/ACT nasal spray Place 2 sprays into both nostrils daily.  Marland Kitchen L-Methylfolate-Algae (DEPLIN 15 PO) Take 15 mg by mouth 1 day or 1 dose.  . magnesium oxide (MAG-OX) 400 MG tablet TAKE 1 TABLET BY MOUTH EVERY DAY  . sertraline (ZOLOFT) 100 MG tablet Take 200 mg by mouth every morning.   . sodium chloride (V-R NASAL SPRAY SALINE) 0.65 % nasal spray Place into the nose.  . [DISCONTINUED] ocrelizumab (OCREVUS) 300 MG/10ML injection Inject 20 mLs (600 mg total) into the vein every 6 (six) months.  . [DISCONTINUED] REMERON 15 MG tablet    Allergies  Allergen Reactions  . Asa [Aspirin] Swelling  . Buspirone Other (See Comments)    Headaches    . Levofloxacin Other (See Comments)    "weakness"  . Lexapro [Escitalopram Oxalate] Other (See Comments)    Weakness   . Motrin [Ibuprofen] Swelling and Other (See Comments)  . Soy Allergy Other (See Comments)  . Ceftin [Cefuroxime Axetil] Rash  . Penicillins Rash  . Sulfa Antibiotics Rash   Recent Results (from the past 2160 hour(s))  CBC     Status: Abnormal   Collection Time: 06/17/18  9:15 AM  Result Value Ref Range   WBC 6.4 4.0 - 10.5 K/uL   RBC 5.16 (H) 3.87 - 5.11 Mil/uL   Platelets 324.0 150.0 - 400.0 K/uL   Hemoglobin 14.7 12.0 - 15.0 g/dL   HCT 43.8 36.0 - 46.0 %   MCV 84.9 78.0 - 100.0 fl   MCHC 33.5 30.0 - 36.0 g/dL   RDW 14.5 11.5 - 15.5 %  Comp Met (CMET)     Status: Abnormal   Collection Time: 06/17/18  9:15 AM  Result Value Ref Range   Sodium 138 135 - 145 mEq/L   Potassium 4.0 3.5 - 5.1 mEq/L   Chloride 103 96 - 112 mEq/L   CO2 27 19 - 32 mEq/L   Glucose, Bld 123 (H) 70 - 99 mg/dL   BUN 8 6 - 23 mg/dL   Creatinine, Ser 0.70 0.40 - 1.20 mg/dL   Total Bilirubin 0.4 0.2 - 1.2 mg/dL   Alkaline Phosphatase 85 39 - 117 U/L   AST 24 0 - 37 U/L   ALT 24 0 - 35 U/L   Total Protein 7.3 6.0 - 8.3 g/dL   Albumin 4.7 3.5 - 5.2  g/dL   Calcium 9.7 8.4 - 10.5 mg/dL   GFR 88.47 >60.00 mL/min   Objective  Body mass index is 30.61 kg/m. Wt Readings from Last 3 Encounters:  06/17/18 172 lb 12.8 oz (78.4 kg)  03/25/18 164 lb (74.4 kg)  03/12/18 172 lb (78 kg)   Temp Readings from Last 3 Encounters:  08/11/18 98.2 F (36.8 C) (Oral)  06/17/18 98.7 F (37.1 C) (Oral)  02/26/18 97.9 F (36.6 C) (Oral)   BP Readings from Last 3 Encounters:  08/11/18 132/78  06/17/18 138/88  03/25/18 130/90   Pulse Readings from Last 3 Encounters:  08/11/18 93  06/17/18 (!) 106  02/26/18 96    Physical Exam  Constitutional: She is oriented to person, place, and time. Vital signs are normal. She appears well-developed and well-nourished. She is cooperative.  HENT:  Head: Normocephalic and atraumatic.  Mouth/Throat: Oropharynx is clear and moist and mucous membranes are normal.  Eyes: Pupils are equal, round, and reactive to light. Conjunctivae are normal.  Cardiovascular: Normal rate, regular rhythm and normal heart sounds.  Pulmonary/Chest: Effort normal and breath sounds normal.  Abdominal: Soft. Bowel sounds are normal. There is tenderness in the right lower quadrant, suprapubic area and left lower quadrant. There is no CVA tenderness.  Neurological: She is alert and oriented to person, place, and time. Gait normal.  Wheelchair used today   Skin: Skin is warm, dry and intact.  Psychiatric: She has a normal mood and affect. Her speech is normal and behavior is normal. Judgment and thought content normal. Cognition and memory are normal.  Nursing note and vitals reviewed.   Assessment   1. C/w UTI  2. MS stable  3. Chronic nausea x 2 months ? Etiology pt initially thought was 2/2 medication changes and withdrawal from d/c remeron but has been off x 2-3 weeks  4. Constipation  Plan   1. UA and culture  Per pt she has taken cipro prev and tolerated 250 bid x 5 days with food d/c macrobid for now  F/u with PCP in 1  week  Disc proper wiping techniques  2. F/u [facility] Dr. Ramond Marrow and f/u Dr. Rana Snare if she and husband choose  3. If not better by f/u with PCP needs further w/u consider zoloft as cause she is on high dose SSIs can cause nausea  Prn zofran odt refilled per husband another doctor did not want her taking phernergan though pt wants to try   4. Disc benefiber otc can try  See med changes as noted in HPI and medication list can review with PCP in future  Provider: Dr. Olivia Mackie McLean-[facility]oc[facility]

## 2018-08-13 ENCOUNTER — Telehealth: Payer: Self-pay | Admitting: Internal Medicine

## 2018-08-13 LAB — URINALYSIS, ROUTINE W REFLEX MICROSCOPIC

## 2018-08-13 LAB — URINE CULTURE

## 2018-08-13 NOTE — Telephone Encounter (Signed)
Copied from [place] [id number]. Topic: Quick Communication - See Telephone Encounter >> Aug 13, 2018 10:09 AM Antonieta Iba C wrote: CRM for notification. See Telephone encounter for: 08/13/18.  Pt and spouse called in to follow up on results, pt is taking Cipro and want to know if it is okay for her to take pepto bismol also?    CB: [phone]

## 2018-08-13 NOTE — Telephone Encounter (Signed)
Answered patients questions about her taking the new medication that is otc and explained that she does not have a UTI per the culture but she does have the bacteria.

## 2018-08-13 NOTE — Telephone Encounter (Signed)
Patient is requesting a call back from [place] in regards to a better understanding. Please advise

## 2018-08-16 ENCOUNTER — Telehealth: Payer: Self-pay | Admitting: Radiology

## 2018-08-16 ENCOUNTER — Ambulatory Visit: Payer: Medicare Other | Admitting: Internal Medicine

## 2018-08-16 ENCOUNTER — Encounter: Payer: Self-pay | Admitting: Internal Medicine

## 2018-08-16 VITALS — BP 130/82 | HR 92 | Temp 98.9°F | Resp 18

## 2018-08-16 DIAGNOSIS — N83202 Unspecified ovarian cyst, left side: Secondary | ICD-10-CM

## 2018-08-16 DIAGNOSIS — R350 Frequency of micturition: Secondary | ICD-10-CM

## 2018-08-16 DIAGNOSIS — K5900 Constipation, unspecified: Secondary | ICD-10-CM

## 2018-08-16 DIAGNOSIS — R110 Nausea: Secondary | ICD-10-CM

## 2018-08-16 DIAGNOSIS — I10 Essential (primary) hypertension: Secondary | ICD-10-CM

## 2018-08-16 DIAGNOSIS — F330 Major depressive disorder, recurrent, mild: Secondary | ICD-10-CM

## 2018-08-16 DIAGNOSIS — G35 Multiple sclerosis: Secondary | ICD-10-CM

## 2018-08-16 DIAGNOSIS — F418 Other specified anxiety disorders: Secondary | ICD-10-CM

## 2018-08-16 DIAGNOSIS — N83201 Unspecified ovarian cyst, right side: Secondary | ICD-10-CM | POA: Diagnosis not present

## 2018-08-16 NOTE — Telephone Encounter (Signed)
Order placed for urine and culture.  Thanks

## 2018-08-16 NOTE — Progress Notes (Signed)
Patient ID: Carly [name], female   DOB: 09-19-1951, 67 y.o.   MRN: [id number]   Subjective:    Patient ID: Carly [name], female    DOB: 06-09-1951, 67 y.o.   MRN: [id number]  HPI  Patient here as a work in for f/u of a recent acute visit with Dr Terese Door.  She was seen for urinary frequency, back pain and lower abdominal discomfort.  Also nausea.  Has MS.  This diagnosis was in question recently.  Saw Dr Ramond Marrow at [facility] and is now followed by Dr Pearletha Forge at [facility].  Aft further w/up, it was felt the diagnosis of MS was confirmed.  Recommended continued treatment with high dose biotin.  Stopped ocrevus.  She is also seeing Dr Chales Salmon at [facility] - neuropsychiatrist.  He is adjusting her psych medications.  Off remeron.  Taking zoloft and Depin.  Some increased anxiety with this transition and with the increased visits and w/up recently.  Discussed with her and her husband today.  Reports starting last week, she noticed some increased urinary frequency.  Some abdominal discomfort.  Some increased issues with constipation.  Was evaluated for urgent visit as outlined.  Diagnosed with uti.  Placed on cipro.  Had to stop after 3 days secondary to intolerance.  States nausea is better today.  Ate better today and last night.  Has had some decreased appetite previously.  Abdominal discomfort better today.  Had a bowel movement.  Discussed using miralax to help with bowel movements.  No vomiting. No chest pain.  Breathing stable.  Trying to start exercising.      Past Medical History:  Diagnosis Date  . Allergy   . Anxiety   . Aspiration pneumonia ([place])   . Depression   . Frequent headaches    H/O  . GERD (gastroesophageal reflux disease)   . History of chicken pox   . History of colon polyps   . Hx of migraines   . Multiple sclerosis ([place])   . PONV (postoperative nausea and vomiting)    Past Surgical History:  Procedure Laterality Date  . BACK SURGERY    .  CHOLECYSTECTOMY    . COLONOSCOPY WITH PROPOFOL N/A 05/18/2017   Procedure: COLONOSCOPY WITH PROPOFOL;  Surgeon: [name], MD;  Location: [facility] ENDOSCOPY;  Service: Endoscopy;  Laterality: N/A;  . FOOT SURGERY  2015  . GALLBLADDER SURGERY  2008  . HARDWARE REMOVAL Left 02/14/2016   Procedure: LEFT FOOT REMOVAL DEEP IMPLANT;  Surgeon: [name], MD;  Location: [place];  Service: Orthopedics;  Laterality: Left;  . HERNIA REPAIR     Inguinal Hernia Repair  . SPINE SURGERY  2014   Family History  Problem Relation Age of Onset  . Arthritis Mother   . Hypertension Mother   . Macular degeneration Mother   . Hypertension Father   . Hyperlipidemia Father   . Heart disease Maternal Grandfather   . Diabetes Maternal Grandfather   . Kidney disease Paternal Grandmother    Social History   Socioeconomic History  . Marital status: Married    Spouse name: Not on file  . Number of children: Not on file  . Years of education: Not on file  . Highest education level: Not on file  Occupational History  . Not on file  Social Needs  . Financial resource strain: Not on file  . Food insecurity:    Worry: Not on file    Inability: Not  on file  . Transportation needs:    Medical: Not on file    Non-medical: Not on file  Tobacco Use  . Smoking status: Never Smoker  . Smokeless tobacco: Never Used  Substance and Sexual Activity  . Alcohol use: No    Alcohol/week: 0.0 standard drinks  . Drug use: No  . Sexual activity: Not Currently  Lifestyle  . Physical activity:    Days per week: Not on file    Minutes per session: Not on file  . Stress: Not on file  Relationships  . Social connections:    Talks on phone: Not on file    Gets together: Not on file    Attends religious service: Not on file    Active member of club or organization: Not on file    Attends meetings of clubs or organizations: Not on file    Relationship status: Not on file  Other Topics Concern  .  Not on file  Social History Narrative   Married     Outpatient Encounter Medications as of 08/16/2018  Medication Sig  . acetaminophen (TYLENOL) 325 MG tablet Take 650 mg by mouth every 4 (four) hours as needed.  . B Complex Vitamins (VITAMIN B COMPLEX PO) Take by mouth.  . Calcium-Phosphorus-Vitamin D (CITRACAL +D3 PO) Take by mouth. 2 tablets twice a day  . Cholecalciferol (D3-1000 PO) Take by mouth.  . Coenzyme Q10 (CO Q10) 100 MG CAPS Take by mouth.  . dalfampridine 10 MG TB12 Take 10 mg by mouth every 12 (twelve) hours.  . diazepam (VALIUM) 5 MG tablet TAKE 1/2 TO 1 TABLET BY MOUTH AT BEDTIME (**STOP LORAZEPAM**)  . fluticasone (FLONASE) 50 MCG/ACT nasal spray Place 2 sprays into both nostrils daily.  Marland Kitchen L-Methylfolate-Algae (DEPLIN 15 PO) Take 15 mg by mouth 1 day or 1 dose.  . magnesium oxide (MAG-OX) 400 MG tablet TAKE 1 TABLET BY MOUTH EVERY DAY  . ondansetron (ZOFRAN ODT) 4 MG disintegrating tablet Take 1 tablet (4 mg total) by mouth every 8 (eight) hours as needed for nausea or vomiting.  . sertraline (ZOLOFT) 100 MG tablet Take 200 mg by mouth every morning.   . sodium chloride (V-R NASAL SPRAY SALINE) 0.65 % nasal spray Place into the nose.  . traZODone (DESYREL) 50 MG tablet Take 50 mg by mouth at bedtime.   . [DISCONTINUED] ciprofloxacin (CIPRO) 250 MG tablet Take 1 tablet (250 mg total) by mouth 2 (two) times daily. X 5 days   Facility-Administered Encounter Medications as of 08/16/2018  Medication  . 0.9 %  sodium chloride infusion  . acetaminophen (TYLENOL) tablet 650 mg    Review of Systems  Constitutional: Positive for appetite change and fatigue.  HENT: Negative for congestion and sinus pressure.   Respiratory: Negative for cough, chest tightness and shortness of breath.   Cardiovascular: Negative for chest pain and leg swelling.  Gastrointestinal: Positive for constipation and nausea. Negative for diarrhea and vomiting.       Some lower abdominal discomfort as  outlined.    Genitourinary: Positive for frequency. Negative for dysuria and vaginal discharge.  Musculoskeletal: Negative for joint swelling.       Some previous lower back discomfort.    Skin: Negative for color change and rash.  Neurological: Negative for dizziness and headaches.  Psychiatric/Behavioral:       Increased anxiety as outlined.         Objective:    Physical Exam  Constitutional: She appears well-developed and  well-nourished. No distress.  HENT:  Nose: Nose normal.  Mouth/Throat: Oropharynx is clear and moist.  Neck: Neck supple.  Cardiovascular: Normal rate and regular rhythm.  Pulmonary/Chest: Breath sounds normal. No respiratory distress. She has no wheezes.  Abdominal: Soft. Bowel sounds are normal.  No significant tenderness to palpation.    Musculoskeletal: She exhibits no edema or tenderness.  Lymphadenopathy:    She has no cervical adenopathy.  Skin: No rash noted. No erythema.  Psychiatric: She has a normal mood and affect. Her behavior is normal.  Some increased anxiety.      BP 130/82   Pulse 92   Temp 98.9 F (37.2 C) (Oral)   Resp 18   SpO2 98%  Wt Readings from Last 3 Encounters:  06/17/18 172 lb 12.8 oz (78.4 kg)  03/25/18 164 lb (74.4 kg)  03/12/18 172 lb (78 kg)     Lab Results  Component Value Date   WBC 6.4 06/17/2018   HGB 14.7 06/17/2018   HCT 43.8 06/17/2018   PLT 324.0 06/17/2018   GLUCOSE 123 (H) 06/17/2018   CHOL 254 (H) 03/10/2017   TRIG 201.0 (H) 03/10/2017   HDL 70.90 03/10/2017   LDLDIRECT 138.0 03/10/2017   LDLCALC 148 (H) 07/15/2016   ALT 24 06/17/2018   AST 24 06/17/2018   NA 138 06/17/2018   K 4.0 06/17/2018   CL 103 06/17/2018   CREATININE 0.70 06/17/2018   BUN 8 06/17/2018   CO2 27 06/17/2018   TSH 1.80 01/06/2018   HGBA1C 5.4 03/10/2017    Mm 3d Screen Breast Bilateral  Result Date: 05/07/2018 CLINICAL DATA:  Screening. EXAM: DIGITAL SCREENING BILATERAL MAMMOGRAM WITH TOMO AND CAD COMPARISON:   Previous exam(s). ACR Breast Density Category c: The breast tissue is heterogeneously dense, which may obscure small masses. FINDINGS: There are no findings suspicious for malignancy. Images were processed with CAD. IMPRESSION: No mammographic evidence of malignancy. A result letter of this screening mammogram will be mailed directly to the patient. RECOMMENDATION: Screening mammogram in one year. (Code:SM-B-01Y) BI-RADS CATEGORY  1: Negative. Electronically Signed   By: Franki Cabot M.D.   On: 05/07/2018 08:31       Assessment & Plan:   Problem List Items Addressed This Visit    Bilateral ovarian cysts    Being followed by gyn.  Last evaluated by Dr Kenton Kingfisher 02/2018.  S/p pelvic ultrasound - simple right ovarian cyst.        BP (high blood pressure)    Blood pressure on recheck wnl.  Follow.  Hold on additional medication.        Relevant Orders   Lipid panel   TSH   Constipation    Has been a persistent intermittent issue.  Had bowel movement recently.  Stomach feels better today.  Discussed miralax.        Depression    Is now being followed by Dr Chales Salmon [facility].  Medication being adjusted.        Depression with anxiety    Now being followed by Dr Deatra Ina [facility].  Medication being adjusted.        Multiple sclerosis ([place])    Now being followed by Dr Pearletha Forge.  Medication being adjusted.  Notes reviewed.        Relevant Orders   Hepatic function panel   CBC with Differential/Platelet   Basic metabolic panel   Nausea    Recently treated for uti.  Increased constipation.  Discussed miralax.  On no abx now.  Recheck urine to confirm no current infection.  Nausea better today.  Keep bowels moving.  Just had CT abdomen and pelvis 02/2018.  Reviewed.         Other Visit Diagnoses    Urinary frequency    -  Primary   Recheck urine to confirm no infection.     Relevant Orders   Urinalysis, Routine w reflex microscopic (Completed)   Urine Culture  (Completed)      I spent 40 minutes with the patient and more than 50% of the time was spent in consultation regarding the above.  Time spent discussing her current symptoms and concerns.  Time also spent discussing recent MD visit, further evaluation and treatment plan.     Einar Pheasant, MD

## 2018-08-16 NOTE — Telephone Encounter (Signed)
Pt husband dropped off urine specimen. Pt was seen today but no orders placed. See phone note from 08/13/18

## 2018-08-17 LAB — URINALYSIS, ROUTINE W REFLEX MICROSCOPIC
BILIRUBIN URINE: NEGATIVE
Hgb urine dipstick: NEGATIVE
KETONES UR: NEGATIVE
LEUKOCYTES UA: NEGATIVE
Nitrite: NEGATIVE
RBC / HPF: NONE SEEN (ref 0–?)
SPECIFIC GRAVITY, URINE: 1.01 (ref 1.000–1.030)
TOTAL PROTEIN, URINE-UPE24: NEGATIVE
URINE GLUCOSE: NEGATIVE
UROBILINOGEN UA: 0.2 (ref 0.0–1.0)
WBC, UA: NONE SEEN (ref 0–?)
pH: 7 (ref 5.0–8.0)

## 2018-08-17 LAB — URINE CULTURE
MICRO NUMBER:: 91324144
SPECIMEN QUALITY:: ADEQUATE

## 2018-08-18 ENCOUNTER — Ambulatory Visit: Payer: Self-pay

## 2018-08-18 ENCOUNTER — Encounter: Payer: Self-pay | Admitting: Internal Medicine

## 2018-08-18 ENCOUNTER — Telehealth: Payer: Self-pay | Admitting: Internal Medicine

## 2018-08-18 NOTE — Telephone Encounter (Signed)
Discussed with pt regarding the urine culture results and her persistent symptoms.  She feels may be related to her bowels.  States has not had a bowel movement in 3 days.  Took miralax yesterday.  Will try enema this pm.  Discussed miralax on a regular basis.  She was requesting earlier appt with GI.  Called GI.  appt made for 08/19/18 at 8:30. Pt comfortable with this plan.  Does not feel needs evaluation today.

## 2018-08-18 NOTE — Telephone Encounter (Signed)
There is another note on this pt.

## 2018-08-18 NOTE — Telephone Encounter (Signed)
Pt called c/o lower abdomen to pelvic pain (pressure). The pain began last Wednesday. Pt stated initially she thought she had a UTI (pt also had urinary frequency) and was seen by PCP 08/16/18. Pt was negative for UTI. Pt stated that she is still having urinary urgency and frequency but no burning.  Pt thinks it could be her irritable bowel syndrome. Pt stated that she is bloated and her last BM was 3 days ago. Pt stated she has tried Magnesium Citrate, MiraLax, senna and enema. Pt stated that she has had constipation since she was on Remeron and after being taken off her antidepressants. Pt stated that she feels very constipated and has been nauseated for the past 2 months. Pt disposition is to see provider within 4 hours. Pt refused to make an appointment because she stated that she was just seen and examined. Pt has a GI doctor and advised pt to call and try and get an appointment. Pt stated she would like to know if Dr Nicki Reaper could call to get her in to GI faster and she thought Dr Nicki Reaper was the only one who could make the appointment. Sending note to PCP now, high priority. Pt is requesting call back. Reason for Disposition . Age > 60 years  Additional Information . Negative: [1] MODERATE pain (e.g., interferes with normal activities) AND [2] pain comes and goes (cramps) AND [3] present > 24 hours  (Exception: pain with Vomiting or Diarrhea - see that Guideline)    Pain is constant  Answer Assessment - Initial Assessment Questions 1. LOCATION: "Where does it hurt?"     Lower abdomen to pelvic pain 2. RADIATION: "Does the pain shoot anywhere else?" (e.g., chest, back)     no 3. ONSET: "When did the pain begin?" (e.g., minutes, hours or days ago)      Last Wednesday 4. SUDDEN: "Gradual or sudden onset?"     gradual 5. PATTERN "Does the pain come and go, or is it constant?"    - If constant: "Is it getting better, staying the same, or worsening?"      (Note: Constant means the pain never goes  away completely; most serious pain is constant and it progresses)     - If intermittent: "How long does it last?" "Do you have pain now?"     (Note: Intermittent means the pain goes away completely between bouts)     Constant -staying the same 6. SEVERITY: "How bad is the pain?"  (e.g., Scale 1-10; mild, moderate, or severe)   - MILD (1-3): doesn't interfere with normal activities, abdomen soft and not tender to touch    - MODERATE (4-7): interferes with normal activities or awakens from sleep, tender to touch    - SEVERE (8-10): excruciating pain, doubled over, unable to do any normal activities      moderate 7. RECURRENT SYMPTOM: "Have you ever had this type of abdominal pain before?" If so, ask: "When was the last time?" and "What happened that time?"      Yes but not constant- was coming off two antidepressants 8. CAUSE: "What do you think is causing the abdominal pain?"     Irritable Bowel Syndrome 9. RELIEVING/AGGRAVATING FACTORS: "What makes it better or worse?" (e.g., movement, antacids, bowel movement)     Heating pad 10. OTHER SYMPTOMS: "Has there been any vomiting, diarrhea, constipation, or urine problems?"       Constipated, nausea for 2 months 11. PREGNANCY: "Is there any chance you are pregnant?" "  When was your last menstrual period?"       n/a  Protocols used: ABDOMINAL PAIN - [facility]

## 2018-08-18 NOTE — Telephone Encounter (Signed)
See note

## 2018-08-18 NOTE — Telephone Encounter (Signed)
Copied from [place] [id number]. Topic: Quick Communication - See Telephone Encounter >> Aug 18, 2018 12:15 PM Antonieta Iba C wrote: CRM for notification. See Telephone encounter for: 08/18/18.  Pt says that she have a referral for GI for pelvic pressure. Pt says that she is unable to get in until 09/30/18, pt would like to be advised further because she said that she is in pain and cant wait that long.   Please advise.

## 2018-08-19 ENCOUNTER — Encounter: Payer: Self-pay | Admitting: Internal Medicine

## 2018-08-19 NOTE — Assessment & Plan Note (Signed)
Now being followed by Dr Pearletha Forge.  Medication being adjusted.  Notes reviewed.

## 2018-08-19 NOTE — Assessment & Plan Note (Signed)
Is now being followed by Dr Chales Salmon [facility].  Medication being adjusted.

## 2018-08-19 NOTE — Assessment & Plan Note (Signed)
Blood pressure on recheck wnl.  Follow.  Hold on additional medication.

## 2018-08-19 NOTE — Assessment & Plan Note (Signed)
Recently treated for uti.  Increased constipation.  Discussed miralax.  On no abx now.  Recheck urine to confirm no current infection.  Nausea better today.  Keep bowels moving.  Just had CT abdomen and pelvis 02/2018.  Reviewed.

## 2018-08-19 NOTE — Assessment & Plan Note (Signed)
Being followed by gyn.  Last evaluated by Dr Kenton Kingfisher 02/2018.  S/p pelvic ultrasound - simple right ovarian cyst.

## 2018-08-19 NOTE — Assessment & Plan Note (Signed)
Has been a persistent intermittent issue.  Had bowel movement recently.  Stomach feels better today.  Discussed miralax.

## 2018-08-19 NOTE — Assessment & Plan Note (Signed)
Now being followed by Dr Deatra Ina [facility].  Medication being adjusted.

## 2018-08-30 ENCOUNTER — Other Ambulatory Visit: Payer: Medicare Other

## 2018-08-31 ENCOUNTER — Ambulatory Visit (INDEPENDENT_AMBULATORY_CARE_PROVIDER_SITE_OTHER): Payer: Medicare Other | Admitting: Urology

## 2018-08-31 ENCOUNTER — Encounter: Payer: Self-pay | Admitting: Urology

## 2018-08-31 VITALS — BP 116/74 | HR 106 | Wt 165.0 lb

## 2018-08-31 DIAGNOSIS — N1330 Unspecified hydronephrosis: Secondary | ICD-10-CM

## 2018-08-31 NOTE — Progress Notes (Signed)
08/31/2018 2:07 PM   Carly [name] [birth year] [id number]  Referring provider: Einar Pheasant, [address] [place], [place] [address]  Chief Complaint  Patient presents with  . Ureteropelvic Junction    HPI: 67 year old female with personal history of her presents a for further evaluation of right hydronephrosis.  She underwent a CT scan of the abdomen and pelvis with contrast on 03/01/2018 which per the report was read as a mild chronic right UPJ obstruction.  Cr 0.9  She denies any flank pain or kidney problems.  No history of stones.  Today, she reports that she has had urinary issues as well as bowel issues since being taken off her Remeron.  She believes that she is going through withdrawal from this medication.  Her symptoms include irritability, urinary urgency and frequency, bloating and constipation.  She was seen and evaluated by her primary care physician for these issues on 08/16/2018 at which time her urinalysis was negative without evidence of UTI.  Her symptoms are slowly improving as more time passes after her cessation of this medication.  PMH: Past Medical History:  Diagnosis Date  . Allergy   . Anxiety   . Aspiration pneumonia ([place])   . Depression   . Frequent headaches    H/O  . GERD (gastroesophageal reflux disease)   . History of chicken pox   . History of colon polyps   . Hx of migraines   . Multiple sclerosis ([place])   . PONV (postoperative nausea and vomiting)     Surgical History: Past Surgical History:  Procedure Laterality Date  . BACK SURGERY    . CHOLECYSTECTOMY    . COLONOSCOPY WITH PROPOFOL N/A 05/18/2017   Procedure: COLONOSCOPY WITH PROPOFOL;  Surgeon: [name], MD;  Location[facility]COPY;  Service: Endoscopy;  Laterality: N/A;  . FOOT SURGERY  2015  . GALLBLADDER SURGERY  2008  . HARDWARE REMOVAL Left 02/14/2016   Procedure: LEFT FOOT REMOVAL DEEP IMPLANT;  Surgeon: [name], MD;  Location:  [facility];  Service: Orthopedics;  Laterality: Left;  . HERNIA REPAIR     Inguinal Hernia Repair  . SPINE SURGERY  2014    Home Medications:  Allergies as of 08/31/2018      Reactions   Sulfamethoxazole-trimethoprim Itching   Asa [aspirin] Swelling   Buspirone Other (See Comments)   Headaches   Levofloxacin Other (See Comments)   "weakness"   Lexapro [escitalopram Oxalate] Other (See Comments)   Weakness   Motrin [ibuprofen] Swelling, Other (See Comments)   Pollen Extract Hives   Soy Allergy Other (See Comments)   Ceftin [cefuroxime Axetil] Rash   Penicillins Rash   Sulfa Antibiotics Rash      Medication List        Accurate as of 08/31/18 11:59 PM. Always use your most recent med list.          acetaminophen 325 MG tablet Commonly known as:  TYLENOL Take 650 mg by mouth every 4 (four) hours as needed.   CITRACAL +D3 PO Take by mouth. 2 tablets twice a day   Co Q10 100 MG Caps Take by mouth.   D3-1000 PO Take by mouth.   dalfampridine 10 MG Tb12 Take 10 mg by mouth every 12 (twelve) hours.   DEPLIN 15 PO Take 15 mg by mouth 1 day or 1 dose.   diazepam 5 MG tablet Commonly known as:  VALIUM TAKE 1/2 TO 1 TABLET BY MOUTH AT  BEDTIME (**STOP LORAZEPAM**)   fluticasone 50 MCG/ACT nasal spray Commonly known as:  FLONASE Place 2 sprays into both nostrils daily.   magnesium oxide 400 MG tablet Commonly known as:  MAG-OX TAKE 1 TABLET BY MOUTH EVERY DAY   ondansetron 4 MG disintegrating tablet Commonly known as:  ZOFRAN-ODT Take 1 tablet (4 mg total) by mouth every 8 (eight) hours as needed for nausea or vomiting.   sertraline 100 MG tablet Commonly known as:  ZOLOFT Take 200 mg by mouth every morning.   traZODone 50 MG tablet Commonly known as:  DESYREL Take 50 mg by mouth at bedtime.   V-R NASAL SPRAY SALINE 0.65 % nasal spray Generic drug:  sodium chloride Place into the nose.   VITAMIN B COMPLEX PO Take by mouth.        Allergies:  Allergies  Allergen Reactions  . Sulfamethoxazole-Trimethoprim Itching  . Asa [Aspirin] Swelling  . Buspirone Other (See Comments)    Headaches    . Levofloxacin Other (See Comments)    "weakness"  . Lexapro [Escitalopram Oxalate] Other (See Comments)    Weakness   . Motrin [Ibuprofen] Swelling and Other (See Comments)  . Pollen Extract Hives  . Soy Allergy Other (See Comments)  . Ceftin [Cefuroxime Axetil] Rash  . Penicillins Rash  . Sulfa Antibiotics Rash    Family History: Family History  Problem Relation Age of Onset  . Arthritis Mother   . Hypertension Mother   . Macular degeneration Mother   . Hypertension Father   . Hyperlipidemia Father   . Heart disease Maternal Grandfather   . Diabetes Maternal Grandfather   . Kidney disease Paternal Grandmother     Social History:  reports that she has never smoked. She has never used smokeless tobacco. She reports that she does not drink alcohol or use drugs.  ROS: UROLOGY Frequent Urination?: Yes Hard to postpone urination?: No Burning/pain with urination?: No Get up at night to urinate?: Yes Leakage of urine?: No Urine stream starts and stops?: No Trouble starting stream?: No Do you have to strain to urinate?: No Blood in urine?: No Urinary tract infection?: No Sexually transmitted disease?: No Injury to kidneys or bladder?: No Painful intercourse?: No Weak stream?: No Currently pregnant?: No Vaginal bleeding?: No Last menstrual period?: n  Gastrointestinal Nausea?: Yes Vomiting?: No Indigestion/heartburn?: Yes Diarrhea?: No Constipation?: Yes  Constitutional Fever: No Night sweats?: No Weight loss?: No Fatigue?: Yes  Skin Skin rash/lesions?: No Itching?: No  Eyes Blurred vision?: Yes Double vision?: No  Ears/Nose/Throat Sore throat?: No Sinus problems?: No  Hematologic/Lymphatic Swollen glands?: No Easy bruising?: No  Cardiovascular Leg swelling?: No Chest pain?:  No  Respiratory Cough?: No Shortness of breath?: No  Endocrine Excessive thirst?: No  Musculoskeletal Back pain?: No Joint pain?: No  Neurological Headaches?: No Dizziness?: No  Psychologic Depression?: Yes Anxiety?: Yes  Physical Exam: BP 116/74   Pulse (!) 106   Wt 165 lb (74.8 kg)   BMI 29.23 kg/m   Constitutional:  Alert and oriented, No acute distress.  In wheelchair today.  Accompanied by husband. HEENT: [place] AT, moist mucus membranes.  Trachea midline, no masses. Cardiovascular: No clubbing, cyanosis, or edema. Respiratory: Normal respiratory effort, no increased work of breathing. GI: Abdomen is soft, nontender, nondistended, no abdominal masses GU: No CVA tenderness Skin: No rashes, bruises or suspicious lesions. Neurologic: Grossly intact, no focal deficits, moving all 4 extremities. Psychiatric: Normal mood and affect.  Laboratory Data: Lab Results  Component Value  Date   WBC 7.5 08/09/2018   HGB 14.7 08/09/2018   HCT 46 08/09/2018   MCV 84.9 06/17/2018   PLT 306 08/09/2018    Lab Results  Component Value Date   CREATININE 0.9 08/09/2018    Lab Results  Component Value Date   HGBA1C 5.4 03/10/2017    Urinalysis    Component Value Date/Time   COLORURINE YELLOW 08/16/2018 1633   APPEARANCEUR CLEAR 08/16/2018 1633   LABSPEC 1.010 08/16/2018 1633   PHURINE 7.0 08/16/2018 1633   GLUCOSEU NEGATIVE 08/16/2018 1633   HGBUR NEGATIVE 08/16/2018 1633   BILIRUBINUR NEGATIVE 08/16/2018 1633   KETONESUR NEGATIVE 08/16/2018 1633   PROTEINUR CANCELED 08/11/2018 1509   PROTEINUR NEGATIVE 02/24/2017 1640   UROBILINOGEN 0.2 08/16/2018 1633   NITRITE NEGATIVE 08/16/2018 1633   LEUKOCYTESUR NEGATIVE 08/16/2018 1633    Pertinent Imaging: CLINICAL DATA:  Right lower quadrant abdominal pain for 10 days.  EXAM: CT ABDOMEN AND PELVIS WITH CONTRAST  TECHNIQUE: Multidetector CT imaging of the abdomen and pelvis was performed using the standard  protocol following bolus administration of intravenous contrast.  CONTRAST:  164mL ISOVUE-300 IOPAMIDOL (ISOVUE-300) INJECTION 61%  COMPARISON:  CT scan 11/17/2017  FINDINGS: Lower chest: The lung bases are clear of acute process. No pleural effusion or pulmonary lesions. The heart is normal in size. No pericardial effusion. The distal esophagus and aorta are unremarkable. Very small hiatal hernia.  Hepatobiliary: No focal hepatic lesions or intrahepatic biliary dilatation. The gallbladder is surgically absent. No common bile duct dilatation.  Pancreas: No mass, inflammation or ductal dilatation.  Spleen: Normal size.  No focal lesions.  Adrenals/Urinary Tract: The adrenal glands and kidneys are unremarkable. No renal, ureteral or bladder calculi or mass. Mild stable chronic UPJ obstruction on the right. No worrisome renal or bladder lesions.  Stomach/Bowel: The stomach, duodenum, small bowel and colon are unremarkable. No acute inflammatory changes, mass lesions or obstructive findings. The terminal ileum is normal. The appendix is normal.  Vascular/Lymphatic: Advanced atherosclerotic calcifications involving the aorta and iliac arteries. No aneurysm or dissection. The major venous structures are patent.  Small scattered mesenteric and retroperitoneal lymph nodes but no mass or adenopathy.  Reproductive: The uterus and ovaries are unremarkable. Right-sided ovarian cyst is noted and left-sided paraovarian cysts.  Other: No pelvic mass or adenopathy. No free pelvic fluid collections. Anterior abdominal wall hernia repair with mesh. No recurrent hernia.  Musculoskeletal: No significant bony findings. Extensive lumbar fusion hardware noted  IMPRESSION: 1. No acute abdominal/pelvic findings, mass lesions or adenopathy. 2. Status post cholecystectomy.  No biliary dilatation. 3. Small hiatal hernia. 4. Mild chronic right UPJ obstruction. 5. Anterior  abdominal wall hernia repair with mesh. No recurrent hernia.   Electronically Signed   By: Marijo Sanes M.D.   On: 03/01/2018 13:06  CT scan was personally reviewed today with the patient.  I do not agree with the interpretation that this is related to a UPJ obstruction as the proximal ureter remains dilated beyond the UPJ itself.    Multiple additional previous CT scans dating away back to 2012 are also reviewed.  There is been minimal to no change in the degree of dilation within the right collecting system in the right proximal ureter, essentially unchanged over the years.  There is no parenchymal thinning.  Assessment & Plan:    1. Hydronephrosis of right kidney Asymptomatic incidental mild right hydroureteronephrosis stable since at least 2012 without any sequela including no evidence of renal compromise, parenchymal thinning, or  renal insufficiency  Based on review of imaging, I do not think that this is secondary to the UPJ obstruction, likely rather some other anatomic anomaly within the proximal/mid ureter which has remained stable and has not progressed.  Minimal to no concern for malignancy given lack of change in almost 8 years.  At this point, would not recommend any further diagnostic evaluation or intervention.  Warning symptoms including flank pain, infections amongst others were reviewed.  She was advised to return if she develops any of these.  All questions answered.  F/u prn  Hollice Espy, MD  [facility] [address], [place], [place] [address] [phone]

## 2018-09-17 ENCOUNTER — Other Ambulatory Visit: Payer: Self-pay | Admitting: Internal Medicine

## 2018-09-17 DIAGNOSIS — G35 Multiple sclerosis: Secondary | ICD-10-CM

## 2018-09-17 MED ORDER — MAGNESIUM OXIDE 400 MG PO TABS: 1.0000 | ORAL_TABLET | Freq: Every day | ORAL | 1 refills | Status: DC

## 2018-09-17 NOTE — Telephone Encounter (Signed)
Copied from [place] [id number]. Topic: Quick Communication - Rx Refill/Question >> Sep 17, 2018  9:18 AM Keene Breath wrote: Medication: magnesium oxide (MAG-OX) 400 MG tablet  Patient called to request a refill for the above medication  Preferred Pharmacy (with phone number or street name): [place], [place] - [address] [phone] (Phone) [phone] (Fax)

## 2018-09-17 NOTE — Telephone Encounter (Signed)
Magnesium lab ordered.  rx ok'd.

## 2018-09-17 NOTE — Telephone Encounter (Signed)
Requested medication (s) are due for refill today: yes  Requested medication (s) are on the active medication list: yes  Last refill:  04/30/18  Future visit scheduled: yes  Notes to clinic:  No Mg level. Sent back per protocol.   Requested Prescriptions  Pending Prescriptions Disp Refills   magnesium oxide (MAG-OX) 400 MG tablet 30 tablet 1    Sig: Take 1 tablet (400 mg total) by mouth daily.     Endocrinology:  Minerals - Magnesium Supplementation Failed - 09/17/2018  9:22 AM      Failed - Mg Level in normal range and within 360 days    No results found for: MG       Passed - Valid encounter within last 12 months    Recent Outpatient Visits          1 month ago Urinary frequency   [place] Scott, [place], MD   1 month ago Acute cystitis without hematuria   [place] McLean-Scocuzza, Nino Glow, MD   3 months ago Diarrhea, unspecified type   [place] Guse, Jacquelynn Cree, FNP   6 months ago Sinus congestion   [facility] [place], Yvetta Coder, FNP   7 months ago Midline low back pain without sciatica, unspecified chronicity    Primary Care [place], Randell Patient, MD      Future Appointments            In 2 months Einar Pheasant, MD [facility]

## 2018-09-17 NOTE — Progress Notes (Signed)
Added magnesium level to labs ordered.

## 2018-09-21 ENCOUNTER — Telehealth: Payer: Self-pay

## 2018-09-21 NOTE — Telephone Encounter (Signed)
Copied from [place] [id number]. Topic: Appointment Scheduling - Scheduling Inquiry for Clinic >> Sep 21, 2018 10:45 AM Sheran Luz wrote: Reason for CRM: Patient is requesting to come in earlier to do labs on day of CPE 02/07.

## 2018-09-23 NOTE — Telephone Encounter (Signed)
Pt scheduled  

## 2018-09-27 ENCOUNTER — Ambulatory Visit: Payer: Medicare Other | Admitting: Family

## 2018-09-27 ENCOUNTER — Encounter: Payer: Self-pay | Admitting: Family

## 2018-09-27 DIAGNOSIS — K649 Unspecified hemorrhoids: Secondary | ICD-10-CM | POA: Diagnosis not present

## 2018-09-27 MED ORDER — HYDROCORTISONE ACETATE 25 MG RE SUPP: 25.0000 mg | Freq: Two times a day (BID) | RECTAL | 0 refills | Status: DC

## 2018-09-27 NOTE — Progress Notes (Signed)
Subjective:    Patient ID: Carly [name], female    DOB: [birth year], 67 y.o.   MRN: [id number]  CC: [name] is a 67 y.o. female who presents today for an acute visit.    HPI: Here today for concern for hemorrhoids.  She has a history of hemorrhoids, first noticed in pregnancy.  Using anu sol cream with intermittent improvement. Took  miralax twice yesterday. Has not been taking everyday. Taking colace daily.     No itching or blood. Feels it there, not tender.  She can appreciate when hemorrhoid can be felt rectum, other times it is not there.  Always had constipation. Last BM today. Formed, brown. No blood.    Following with psychiatry changed from zolft to cymbalta. Nausea resolved. Suspects from zoloft .  Seen by PCP 1//2019 , recent UTI.    Dr Pearletha Forge -f multiple sclerosis.  Dr Erlene Quan 08/31/18 right hydronephrosis 08/19/2018 [facility] for constipation.   Colonoscopy Dr Vira Agar 2018;lnternal hemorrhoids  HISTORY:  Past Medical History:  Diagnosis Date  . Allergy   . Anxiety   . Aspiration pneumonia ([place])   . Depression   . Frequent headaches    H/O  . GERD (gastroesophageal reflux disease)   . History of chicken pox   . History of colon polyps   . Hx of migraines   . Multiple sclerosis ([place])   . PONV (postoperative nausea and vomiting)    Past Surgical History:  Procedure Laterality Date  . BACK SURGERY    . CHOLECYSTECTOMY    . COLONOSCOPY WITH PROPOFOL N/A 05/18/2017   Procedure: COLONOSCOPY WITH PROPOFOL;  Surgeon: [name], MD;  Location: [facility] ENDOSCOPY;  Service: Endoscopy;  Laterality: N/A;  . FOOT SURGERY  2015  . GALLBLADDER SURGERY  2008  . HARDWARE REMOVAL Left 02/14/2016   Procedure: LEFT FOOT REMOVAL DEEP IMPLANT;  Surgeon: [name], MD;  Location: [facility];  Service: Orthopedics;  Laterality: Left;  . HERNIA REPAIR     Inguinal Hernia Repair  . SPINE SURGERY  2014   Family History  Problem  Relation Age of Onset  . Arthritis Mother   . Hypertension Mother   . Macular degeneration Mother   . Hypertension Father   . Hyperlipidemia Father   . Heart disease Maternal Grandfather   . Diabetes Maternal Grandfather   . Kidney disease Paternal Grandmother     Allergies: Sulfamethoxazole-trimethoprim; Asa [aspirin]; Buspirone; Levofloxacin; Lexapro [escitalopram oxalate]; Motrin [ibuprofen]; Pollen extract; Soy allergy; Ceftin [cefuroxime axetil]; Penicillins; and Sulfa antibiotics Current Outpatient Medications on File Prior to Visit  Medication Sig Dispense Refill  . acetaminophen (TYLENOL) 325 MG tablet Take 650 mg by mouth every 4 (four) hours as needed.    . B Complex Vitamins (VITAMIN B COMPLEX PO) Take by mouth.    . Calcium-Phosphorus-Vitamin D (CITRACAL +D3 PO) Take by mouth. 2 tablets twice a day    . Cholecalciferol (D3-1000 PO) Take by mouth.    . Coenzyme Q10 (CO Q10) 100 MG CAPS Take by mouth.    . dalfampridine 10 MG TB12 Take 10 mg by mouth every 12 (twelve) hours.    . diazepam (VALIUM) 5 MG tablet TAKE 1/2 TO 1 TABLET BY MOUTH AT BEDTIME (**STOP LORAZEPAM**)  5  . fluticasone (FLONASE) 50 MCG/ACT nasal spray Place 2 sprays into both nostrils daily. 16 g 4  . L-Methylfolate-Algae (DEPLIN 15 PO) Take 15 mg by mouth 1 day or 1 dose.    Marland Kitchen  magnesium oxide (MAG-OX) 400 MG tablet Take 1 tablet (400 mg total) by mouth daily. 30 tablet 1  . ondansetron (ZOFRAN ODT) 4 MG disintegrating tablet Take 1 tablet (4 mg total) by mouth every 8 (eight) hours as needed for nausea or vomiting. 30 tablet 0  . sertraline (ZOLOFT) 100 MG tablet Take 200 mg by mouth every morning.   1  . sodium chloride (V-R NASAL SPRAY SALINE) 0.65 % nasal spray Place into the nose.    . traZODone (DESYREL) 50 MG tablet Take 50 mg by mouth at bedtime.   12   Current Facility-Administered Medications on File Prior to Visit  Medication Dose Route Frequency Provider Last Rate Last Dose  . 0.9 %  sodium  chloride infusion   Intravenous Continuous Lequita Asal, MD 10 mL/hr at 04/25/16 0855    . acetaminophen (TYLENOL) tablet 650 mg  650 mg Oral Once Lequita Asal, MD        Social History   Tobacco Use  . Smoking status: Never Smoker  . Smokeless tobacco: Never Used  Substance Use Topics  . Alcohol use: No    Alcohol/week: 0.0 standard drinks  . Drug use: No    Review of Systems  Constitutional: Negative for chills and fever.  Respiratory: Negative for cough.   Cardiovascular: Negative for chest pain and palpitations.  Gastrointestinal: Positive for constipation (chronic). Negative for abdominal pain, anal bleeding, blood in stool, diarrhea, nausea, rectal pain and vomiting.      Objective:    BP 128/82 (BP Location: Left Arm, Patient Position: Sitting, Cuff Size: Large)   Pulse (!) 107   Temp 98.1 F (36.7 C)   SpO2 95%    Physical Exam Vitals signs reviewed.  Constitutional:      Appearance: She is well-developed.  Eyes:     Conjunctiva/sclera: Conjunctivae normal.  Cardiovascular:     Rate and Rhythm: Normal rate and regular rhythm.     Pulses: Normal pulses.     Heart sounds: Normal heart sounds.  Pulmonary:     Effort: Pulmonary effort is normal.     Breath sounds: Normal breath sounds. No wheezing, rhonchi or rales.  Genitourinary:    Rectum: No tenderness, anal fissure or external hemorrhoid.       Comments: Skin tag noted as marked on diagram Skin:    General: Skin is warm and dry.  Neurological:     Mental Status: She is alert.  Psychiatric:        Speech: Speech normal.        Behavior: Behavior normal.        Thought Content: Thought content normal.     Comments: In wheelchair        Assessment & Plan:   Problem List Items Addressed This Visit      Cardiovascular and Mediastinum   Hemorrhoid    Symptoms consistent with hemorrhoids although unable to appreciate them on exam today.  Patient is already established with GI and I  encouraged her to follow back up with [facility] for further treatment modalities.  In the interim, I refilled her Anusol.  Education provided on treatment of constipation which explained to patient is important in regard to hemorrhoids.  Colonoscopy is up-to-date.  Pending stool cards.           I am having Jenissa M. Turski start on hydrocortisone. I am also having her maintain her acetaminophen, dalfampridine, Co Q10, Calcium-Phosphorus-Vitamin D (CITRACAL +D3 PO), B Complex  Vitamins (VITAMIN B COMPLEX PO), Cholecalciferol (D3-1000 PO), sodium chloride, sertraline, fluticasone, L-Methylfolate-Algae (DEPLIN 15 PO), diazepam, traZODone, ondansetron, and magnesium oxide.   Meds ordered this encounter  Medications  . hydrocortisone (ANUSOL-HC) 25 MG suppository    Sig: Place 1 suppository (25 mg total) rectally 2 (two) times daily.    Dispense:  12 suppository    Refill:  0    Order Specific Question:   Supervising Provider    Answer:   Crecencio Mc [[id number]]    Return precautions given.   Risks, benefits, and alternatives of the medications and treatment plan prescribed today were discussed, and patient expressed understanding.   Education regarding symptom management and diagnosis given to patient on AVS.  Continue to follow with Einar Pheasant, MD for routine health maintenance.   [name] and I agreed with plan.   Mable Paris, FNP

## 2018-09-27 NOTE — Patient Instructions (Signed)
Suspect internal hemorrhoids.  Please bring back stool cards to our office.  Please ensure you follow-up with Dawson Bills at GI and ask her to evaluate your rectum as well  in regards to hemorrhoids, as you may need surgical procedure.  Constipation plan  1) Take Miralax once to twice per day until you have a bowel movement. You will end up titrating the use of Miralax. For example, you  may find that using the medication every other day or three times a week is a good bowel regimen for you. Or perhaps, twice weekly.   2)  If you do not get results with Miralax alone, you may then add Bisacodyl suppository daily to regimen until you get desired bowel results.  3) Take Colace ( stool softener) twice daily every day.   It is MOST important to drink LOTS of water and follow a HIGH fiber diet to keep foods moving through the gut. You may add Metamucil to a beverage that you drink.  Information on prevention of constipation as well as acute treatment for constipation as included below.  If there is no improvement in your symptoms, or if there is any worsening of symptoms, or if you have any additional concerns, please return to this clinic for re-evaluation; or, if we are closed, consider going to the Emergency Room for evaluation.    Constipation Prevention What is Constipation? Constipation is hard, dry bowel movements or the inability to have a bowel movement.  You can also feel like you need to have a bowel movement but not be able to.  It can also be painful when you strain to have a bowel movement.  Taking narcotic pain medicine after surgery can make you constipated, even if you have never had a problem with constipation. What Do I Need To Do? The best thing to do for constipation is to keep it from happening.  This can be done by: Adding laxatives to your daily routine, when taking prescription pain medicines after surgery. Add 17 gm Miralax daily or 100 mg Colace once or twice daily.  (Miralax is mixed in water. Colace is a pill). They soften your bowel movements to make them easier to pass and hurt less. Drink plenty of water to help flush your bowels.  (Eight, 8 ounce glasses daily) Eat foods high in fiber such as whole grains, vegetables, cereals, fruits, and prune juice (5-7 servings a day or 25 grams).  If you do not know how much fiber a food has in it you can look on the label under dietary fiber.  If you have trouble getting enough fiber in your diet you may want to consider a fiber supplement such as Metamucil or Citrucel.  Also, be aware that eating fiber without drinking enough water can make constipation worse. If you do become constipated some medications that may help are: Bisacodyl (Dulcolax) is available in tablet form or a suppository. Glycerin suppositories are also a good choice if you need a fast acting medication. Everybody is different and may have different results.  Talk to your pharmacist or health care provider about your specific problems. They can help you choose the best product for you.  Why Is It Important for Me To Do This? Being constipated is not something you have to live with.  There are many things you can do to help.   Feeling bad can interfere with your recovery after surgery.  If constipation goes on for too long it can become a very  serious medical problem. You may need to visit your doctor or go to the hospital.  That is why it is very important to drink lots of water, eat enough fiber, and keep it from happening.  Ask Questions We want to answer all of your questions and concerns.  Thats why we encourage you to use a program called Ask Me 3, created by the [employer].  By using Ask Me 3 you are encouraged to ask 3 simple (yet, potentially life saving questions) whenever you are talking with your physician, nurse or pharmacist: What is my main problem? What do I need to do? Why is it important for me to  do this? By understanding the answer to these three questions and any other questions you may have, you have the knowledge necessary to manage your health. Please feel very comfortable asking any questions. Healthcare is complicated, so if you hear an answer you do not understand, please ask your health care team to explain again.   Sources: [employer] Medline Plus 08-01-10 N   Hemorrhoids Hemorrhoids are swollen veins in and around the rectum or anus. There are two types of hemorrhoids:  Internal hemorrhoids. These occur in the veins that are just inside the rectum. They may poke through to the outside and become irritated and painful.  External hemorrhoids. These occur in the veins that are outside of the anus and can be felt as a painful swelling or hard lump near the anus.  Most hemorrhoids do not cause serious problems, and they can be managed with home treatments such as diet and lifestyle changes. If home treatments do not help your symptoms, procedures can be done to shrink or remove the hemorrhoids. What are the causes? This condition is caused by increased pressure in the anal area. This pressure may result from various things, including:  Constipation.  Straining to have a bowel movement.  Diarrhea.  Pregnancy.  Obesity.  Sitting for long periods of time.  Heavy lifting or other activity that causes you to strain.  Anal sex.  What are the signs or symptoms? Symptoms of this condition include:  Pain.  Anal itching or irritation.  Rectal bleeding.  Leakage of stool (feces).  Anal swelling.  One or more lumps around the anus.  How is this diagnosed? This condition can often be diagnosed through a visual exam. Other exams or tests may also be done, such as:  Examination of the rectal area with a gloved hand (digital rectal exam).  Examination of the anal canal using a small tube (anoscope).  A blood test, if you have lost a significant amount of  blood.  A test to look inside the colon (sigmoidoscopy or colonoscopy).  How is this treated? This condition can usually be treated at home. However, various procedures may be done if dietary changes, lifestyle changes, and other home treatments do not help your symptoms. These procedures can help make the hemorrhoids smaller or remove them completely. Some of these procedures involve surgery, and others do not. Common procedures include:  Rubber band ligation. Rubber bands are placed at the base of the hemorrhoids to cut off the blood supply to them.  Sclerotherapy. Medicine is injected into the hemorrhoids to shrink them.  Infrared coagulation. A type of light energy is used to get rid of the hemorrhoids.  Hemorrhoidectomy surgery. The hemorrhoids are surgically removed, and the veins that supply them are tied off.  Stapled hemorrhoidopexy surgery. A circular stapling device is  used to remove the hemorrhoids and use staples to cut off the blood supply to them.  Follow these instructions at home: Eating and drinking  Eat foods that have a lot of fiber in them, such as whole grains, beans, nuts, fruits, and vegetables. Ask your health care provider about taking products that have added fiber (fiber supplements).  Drink enough fluid to keep your urine clear or pale yellow. Managing pain and swelling  Take warm sitz baths for 20 minutes, 3-4 times a day to ease pain and discomfort.  If directed, apply ice to the affected area. Using ice packs between sitz baths may be helpful. ? Put ice in a plastic bag. ? Place a towel between your skin and the bag. ? Leave the ice on for 20 minutes, 2-3 times a day. General instructions  Take over-the-counter and prescription medicines only as told by your health care provider.  Use medicated creams or suppositories as told.  Exercise regularly.  Go to the bathroom when you have the urge to have a bowel movement. Do not wait.  Avoid straining  to have bowel movements.  Keep the anal area dry and clean. Use wet toilet paper or moist towelettes after a bowel movement.  Do not sit on the toilet for long periods of time. This increases blood pooling and pain. Contact a health care provider if:  You have increasing pain and swelling that are not controlled by treatment or medicine.  You have uncontrolled bleeding.  You have difficulty having a bowel movement, or you are unable to have a bowel movement.  You have pain or inflammation outside the area of the hemorrhoids. This information is not intended to replace advice given to you by your health care provider. Make sure you discuss any questions you have with your health care provider. Document Released: 09/26/2000 Document Revised: 02/27/2016 Document Reviewed: 06/13/2015 Elsevier Interactive Patient Education  [employer].

## 2018-09-27 NOTE — Assessment & Plan Note (Signed)
Symptoms consistent with hemorrhoids although unable to appreciate them on exam today.  Patient is already established with GI and I encouraged her to follow back up with Dawson Bills for further treatment modalities.  In the interim, I refilled her Anusol.  Education provided on treatment of constipation which explained to patient is important in regard to hemorrhoids.  Colonoscopy is up-to-date.  Pending stool cards.

## 2018-09-28 ENCOUNTER — Encounter: Payer: Medicare Other | Admitting: Internal Medicine

## 2018-10-04 ENCOUNTER — Ambulatory Visit: Payer: Self-pay | Admitting: *Deleted

## 2018-10-04 NOTE — Telephone Encounter (Signed)
Would hold essential oils if feeling more anxious since starting, etc.  Let us know if persistent problems.

## 2018-10-04 NOTE — Telephone Encounter (Signed)
Message from Conception Chancy, NT sent at 10/04/2018 1:42 PM EST   Patient is calling and states she has been using essential oils and she has been really nervous since then. She states she has not checked her blood pressure but she is going someone to check it here shortly. She would like to talk to a nurse about if essential oils can make you BP go up/   Attempted to call the pt but no answer at this time.Left message on voicemail for pt to return call to the office.

## 2018-10-04 NOTE — Telephone Encounter (Signed)
Please advise & will return patient's call?

## 2018-10-04 NOTE — Telephone Encounter (Signed)
I tried to call patient at both numbers listed & LMTCB to advise on below.

## 2018-10-04 NOTE — Telephone Encounter (Signed)
Attempted to contact pt; left message on voicemail [phone].

## 2018-10-05 ENCOUNTER — Ambulatory Visit: Payer: Self-pay

## 2018-10-05 NOTE — Telephone Encounter (Signed)
Pt called stating that she uses essential oil diffuser in her home.  Her son developed a red face.  She stopped the diffuser and the symptoms went away. She was inquiring if some people may be allergic to essential oils. NT advised pt that some people will react to essential oils and recommended that she not use them in her home.  Pt verbalized understanding of all instructions.  Reason for Disposition . Caller has medication question only, adult not sick, and triager answers question  Answer Assessment - Initial Assessment Questions 1. SYMPTOMS: "Do you have any symptoms?"     Son had redness on face 2. SEVERITY: If symptoms are present, ask "Are they mild, moderate or severe?"     Diffuser in room of son with essential oil. Was conderned that he was allergic.  Protocols used: MEDICATION QUESTION CALL-A-AH

## 2018-10-05 NOTE — Telephone Encounter (Signed)
I spoke with patient & stated that she would hold off on the essential oils. She would call back if she had worsening symptoms.

## 2018-10-29 ENCOUNTER — Telehealth: Payer: Self-pay | Admitting: *Deleted

## 2018-10-29 NOTE — Telephone Encounter (Signed)
Copied from [place] [id number]. Topic: Appointment Scheduling - Scheduling Inquiry for Clinic >> Oct 29, 2018  9:16 AM Margot Ables wrote: Reason for CRM: Pt stating her son has surgery consultation on 11/19/2018 at 3:00pm which is the same time she is scheduled for CPE. Pt husband does all the driving so he cannot bring her at the same time he takes his son. Pt is asking if Dr. Nicki Reaper can see her another time close to this date for her physical. The next available on the schedule is 02/07/2019. Please call to advise.

## 2018-11-02 NOTE — Telephone Encounter (Signed)
appt and labs moved. Pt requested to have appt in march.

## 2018-11-05 DIAGNOSIS — K641 Second degree hemorrhoids: Secondary | ICD-10-CM | POA: Diagnosis not present

## 2018-11-17 ENCOUNTER — Other Ambulatory Visit: Payer: Medicare Other

## 2018-11-19 ENCOUNTER — Encounter: Payer: Medicare Other | Admitting: Internal Medicine

## 2018-11-30 DIAGNOSIS — H6123 Impacted cerumen, bilateral: Secondary | ICD-10-CM | POA: Diagnosis not present

## 2018-11-30 DIAGNOSIS — J019 Acute sinusitis, unspecified: Secondary | ICD-10-CM | POA: Diagnosis not present

## 2018-11-30 DIAGNOSIS — K141 Geographic tongue: Secondary | ICD-10-CM | POA: Diagnosis not present

## 2018-11-30 DIAGNOSIS — J069 Acute upper respiratory infection, unspecified: Secondary | ICD-10-CM | POA: Diagnosis not present

## 2018-12-08 ENCOUNTER — Telehealth: Payer: Self-pay | Admitting: Internal Medicine

## 2018-12-08 NOTE — Telephone Encounter (Signed)
Santiago Glad from [facility] called stating that Mrs. Tibbs has PCS coming out to her home ([place]) but they do not do PT. Mrs. Palladino contacted her to see if they can come out to do PT. Santiago Glad just needs an order sent to her before they can go out to see her for this. Karen's efax is [phone]. I'm more than happy to fax it to her if you are agreeable. Thanks! Melissa

## 2018-12-08 NOTE — Telephone Encounter (Signed)
Ok to give verbal order.

## 2018-12-08 NOTE — Telephone Encounter (Signed)
Carly [name] states that she needs PT for balance and gait. She said that the office note from December will work for this order since it is within the 90 days.

## 2018-12-08 NOTE — Telephone Encounter (Signed)
Spoke with you about this

## 2018-12-08 NOTE — Telephone Encounter (Signed)
I am ok with PT working with pt if needed, need to document the reason.

## 2018-12-09 NOTE — Telephone Encounter (Signed)
Noted! Thank you

## 2018-12-09 NOTE — Telephone Encounter (Signed)
Santiago Glad called from [facility] called back this morning regarding order for PT. Gave verbal order to [facility], [facility], [facility] her [facility]. They will begin care asap.

## 2018-12-10 ENCOUNTER — Telehealth: Payer: Self-pay | Admitting: Internal Medicine

## 2018-12-10 NOTE — Telephone Encounter (Signed)
Copied from [place] [phone]. Topic: Quick Communication - See Telephone Encounter >> Dec 10, 2018  2:09 PM Blase Mess A wrote: CRM for notification. See Telephone encounter for: 12/10/18.  Kathlee Nations calling from [facility] is calling to get the start date approved to Monday December 13, 2018. The Patient is not feeling well. Please advise [phone]

## 2018-12-10 NOTE — Telephone Encounter (Signed)
Verbal given to Carly [name] to start PT on 12/13/18. Patient said she did not feel well today and wanted to start on Monday

## 2018-12-13 ENCOUNTER — Telehealth: Payer: Self-pay | Admitting: Internal Medicine

## 2018-12-13 DIAGNOSIS — F329 Major depressive disorder, single episode, unspecified: Secondary | ICD-10-CM | POA: Diagnosis not present

## 2018-12-13 DIAGNOSIS — F419 Anxiety disorder, unspecified: Secondary | ICD-10-CM | POA: Diagnosis not present

## 2018-12-13 DIAGNOSIS — Z96652 Presence of left artificial knee joint: Secondary | ICD-10-CM | POA: Diagnosis not present

## 2018-12-13 DIAGNOSIS — Z90710 Acquired absence of both cervix and uterus: Secondary | ICD-10-CM | POA: Diagnosis not present

## 2018-12-13 DIAGNOSIS — E119 Type 2 diabetes mellitus without complications: Secondary | ICD-10-CM | POA: Diagnosis not present

## 2018-12-13 DIAGNOSIS — Z9689 Presence of other specified functional implants: Secondary | ICD-10-CM | POA: Diagnosis not present

## 2018-12-13 DIAGNOSIS — D510 Vitamin B12 deficiency anemia due to intrinsic factor deficiency: Secondary | ICD-10-CM | POA: Diagnosis not present

## 2018-12-13 DIAGNOSIS — G43909 Migraine, unspecified, not intractable, without status migrainosus: Secondary | ICD-10-CM | POA: Diagnosis not present

## 2018-12-13 DIAGNOSIS — M5441 Lumbago with sciatica, right side: Secondary | ICD-10-CM | POA: Diagnosis not present

## 2018-12-13 DIAGNOSIS — M48061 Spinal stenosis, lumbar region without neurogenic claudication: Secondary | ICD-10-CM | POA: Diagnosis not present

## 2018-12-13 DIAGNOSIS — M4316 Spondylolisthesis, lumbar region: Secondary | ICD-10-CM | POA: Diagnosis not present

## 2018-12-13 DIAGNOSIS — B199 Unspecified viral hepatitis without hepatic coma: Secondary | ICD-10-CM | POA: Diagnosis not present

## 2018-12-13 DIAGNOSIS — K648 Other hemorrhoids: Secondary | ICD-10-CM | POA: Diagnosis not present

## 2018-12-13 DIAGNOSIS — N1330 Unspecified hydronephrosis: Secondary | ICD-10-CM | POA: Diagnosis not present

## 2018-12-13 DIAGNOSIS — Z86010 Personal history of colonic polyps: Secondary | ICD-10-CM | POA: Diagnosis not present

## 2018-12-13 DIAGNOSIS — Z87440 Personal history of urinary (tract) infections: Secondary | ICD-10-CM | POA: Diagnosis not present

## 2018-12-13 DIAGNOSIS — M75102 Unspecified rotator cuff tear or rupture of left shoulder, not specified as traumatic: Secondary | ICD-10-CM | POA: Diagnosis not present

## 2018-12-13 DIAGNOSIS — E785 Hyperlipidemia, unspecified: Secondary | ICD-10-CM | POA: Diagnosis not present

## 2018-12-13 DIAGNOSIS — Z9049 Acquired absence of other specified parts of digestive tract: Secondary | ICD-10-CM | POA: Diagnosis not present

## 2018-12-13 DIAGNOSIS — E669 Obesity, unspecified: Secondary | ICD-10-CM | POA: Diagnosis not present

## 2018-12-13 DIAGNOSIS — G8929 Other chronic pain: Secondary | ICD-10-CM | POA: Diagnosis not present

## 2018-12-13 DIAGNOSIS — G35 Multiple sclerosis: Secondary | ICD-10-CM | POA: Diagnosis not present

## 2018-12-13 DIAGNOSIS — K219 Gastro-esophageal reflux disease without esophagitis: Secondary | ICD-10-CM | POA: Diagnosis not present

## 2018-12-13 DIAGNOSIS — K5900 Constipation, unspecified: Secondary | ICD-10-CM | POA: Diagnosis not present

## 2018-12-13 DIAGNOSIS — Z87891 Personal history of nicotine dependence: Secondary | ICD-10-CM | POA: Diagnosis not present

## 2018-12-13 NOTE — Telephone Encounter (Signed)
Copied from [place] [id number]. Topic: Quick Communication - See Telephone Encounter >> Dec 13, 2018  4:32 PM Bea Graff, NT wrote: CRM for notification. See Telephone encounter for: 12/13/18. Kathlee Nations with [facility] calling to let the office know that the pt will be seen 2 times a week for PT. CB#: [phone]

## 2018-12-13 NOTE — Telephone Encounter (Signed)
Left message for Kathlee Nations

## 2018-12-14 DIAGNOSIS — K140 Glossitis: Secondary | ICD-10-CM | POA: Diagnosis not present

## 2018-12-14 DIAGNOSIS — J019 Acute sinusitis, unspecified: Secondary | ICD-10-CM | POA: Diagnosis not present

## 2018-12-14 DIAGNOSIS — K648 Other hemorrhoids: Secondary | ICD-10-CM | POA: Diagnosis not present

## 2018-12-14 NOTE — Telephone Encounter (Signed)
PT confirmed VM received.

## 2018-12-16 DIAGNOSIS — G35 Multiple sclerosis: Secondary | ICD-10-CM | POA: Diagnosis not present

## 2018-12-16 DIAGNOSIS — R110 Nausea: Secondary | ICD-10-CM | POA: Diagnosis not present

## 2018-12-16 DIAGNOSIS — D802 Selective deficiency of immunoglobulin A [IgA]: Secondary | ICD-10-CM | POA: Diagnosis not present

## 2018-12-16 DIAGNOSIS — E559 Vitamin D deficiency, unspecified: Secondary | ICD-10-CM | POA: Diagnosis not present

## 2018-12-21 DIAGNOSIS — G8929 Other chronic pain: Secondary | ICD-10-CM | POA: Diagnosis not present

## 2018-12-21 DIAGNOSIS — E119 Type 2 diabetes mellitus without complications: Secondary | ICD-10-CM | POA: Diagnosis not present

## 2018-12-21 DIAGNOSIS — E669 Obesity, unspecified: Secondary | ICD-10-CM | POA: Diagnosis not present

## 2018-12-21 DIAGNOSIS — M5441 Lumbago with sciatica, right side: Secondary | ICD-10-CM | POA: Diagnosis not present

## 2018-12-21 DIAGNOSIS — F329 Major depressive disorder, single episode, unspecified: Secondary | ICD-10-CM | POA: Diagnosis not present

## 2018-12-21 DIAGNOSIS — G35 Multiple sclerosis: Secondary | ICD-10-CM | POA: Diagnosis not present

## 2018-12-25 DIAGNOSIS — F329 Major depressive disorder, single episode, unspecified: Secondary | ICD-10-CM | POA: Diagnosis not present

## 2018-12-25 DIAGNOSIS — E119 Type 2 diabetes mellitus without complications: Secondary | ICD-10-CM | POA: Diagnosis not present

## 2018-12-25 DIAGNOSIS — E669 Obesity, unspecified: Secondary | ICD-10-CM | POA: Diagnosis not present

## 2018-12-25 DIAGNOSIS — G35 Multiple sclerosis: Secondary | ICD-10-CM | POA: Diagnosis not present

## 2018-12-25 DIAGNOSIS — G8929 Other chronic pain: Secondary | ICD-10-CM | POA: Diagnosis not present

## 2018-12-25 DIAGNOSIS — M5441 Lumbago with sciatica, right side: Secondary | ICD-10-CM | POA: Diagnosis not present

## 2018-12-29 DIAGNOSIS — M5441 Lumbago with sciatica, right side: Secondary | ICD-10-CM | POA: Diagnosis not present

## 2018-12-29 DIAGNOSIS — E669 Obesity, unspecified: Secondary | ICD-10-CM | POA: Diagnosis not present

## 2018-12-29 DIAGNOSIS — E119 Type 2 diabetes mellitus without complications: Secondary | ICD-10-CM | POA: Diagnosis not present

## 2018-12-29 DIAGNOSIS — F329 Major depressive disorder, single episode, unspecified: Secondary | ICD-10-CM | POA: Diagnosis not present

## 2018-12-29 DIAGNOSIS — G8929 Other chronic pain: Secondary | ICD-10-CM | POA: Diagnosis not present

## 2018-12-29 DIAGNOSIS — G35 Multiple sclerosis: Secondary | ICD-10-CM | POA: Diagnosis not present

## 2019-01-04 ENCOUNTER — Telehealth: Payer: Self-pay

## 2019-01-04 NOTE — Telephone Encounter (Signed)
Copied from [place] [phone]. Topic: General - Other >> Jan 04, 2019  2:32 PM Leward Quan A wrote: Reason for CRM: Patient called to ask Dr Nicki Reaper can she just come in and have her labs in the morning. Would like a call back with an answer today please.  Ph# [phone]

## 2019-01-04 NOTE — Telephone Encounter (Signed)
Pt scheduled for virtual visit 

## 2019-01-04 NOTE — Telephone Encounter (Signed)
See other note

## 2019-01-04 NOTE — Telephone Encounter (Signed)
Copied from [place] [phone]. Topic: Appointment Scheduling - Scheduling Inquiry for Clinic >> Jan 04, 2019  3:02 PM Rutherford Nail, [place] wrote: Reason for CRM: Patient calling and states that she was on the phone with someone regarding her appointment on 01/07/2019 and was discussing a "skype" visit. Would like a call back regarding this. Please advise.

## 2019-01-05 ENCOUNTER — Other Ambulatory Visit (INDEPENDENT_AMBULATORY_CARE_PROVIDER_SITE_OTHER): Payer: Medicare Other

## 2019-01-05 ENCOUNTER — Other Ambulatory Visit: Payer: Self-pay

## 2019-01-05 DIAGNOSIS — I10 Essential (primary) hypertension: Secondary | ICD-10-CM

## 2019-01-05 DIAGNOSIS — G35 Multiple sclerosis: Secondary | ICD-10-CM | POA: Diagnosis not present

## 2019-01-05 LAB — HEPATIC FUNCTION PANEL
ALT: 55 U/L — ABNORMAL HIGH (ref 0–35)
AST: 39 U/L — ABNORMAL HIGH (ref 0–37)
Albumin: 4.5 g/dL (ref 3.5–5.2)
Alkaline Phosphatase: 107 U/L (ref 39–117)
Bilirubin, Direct: 0.1 mg/dL (ref 0.0–0.3)
Total Bilirubin: 0.5 mg/dL (ref 0.2–1.2)
Total Protein: 6.6 g/dL (ref 6.0–8.3)

## 2019-01-05 LAB — CBC WITH DIFFERENTIAL/PLATELET
Basophils Absolute: 0.1 10*3/uL (ref 0.0–0.1)
Basophils Relative: 1 % (ref 0.0–3.0)
Eosinophils Absolute: 0.2 10*3/uL (ref 0.0–0.7)
Eosinophils Relative: 3.1 % (ref 0.0–5.0)
HCT: 45 % (ref 36.0–46.0)
Hemoglobin: 15 g/dL (ref 12.0–15.0)
Lymphocytes Relative: 29.9 % (ref 12.0–46.0)
Lymphs Abs: 2 10*3/uL (ref 0.7–4.0)
MCHC: 33.4 g/dL (ref 30.0–36.0)
MCV: 85.9 fl (ref 78.0–100.0)
Monocytes Absolute: 0.3 10*3/uL (ref 0.1–1.0)
Monocytes Relative: 5.1 % (ref 3.0–12.0)
Neutro Abs: 4 10*3/uL (ref 1.4–7.7)
Neutrophils Relative %: 60.9 % (ref 43.0–77.0)
Platelets: 295 10*3/uL (ref 150.0–400.0)
RBC: 5.24 Mil/uL — AB (ref 3.87–5.11)
RDW: 14.7 % (ref 11.5–15.5)
WBC: 6.6 10*3/uL (ref 4.0–10.5)

## 2019-01-05 LAB — BASIC METABOLIC PANEL
BUN: 8 mg/dL (ref 6–23)
CO2: 23 mEq/L (ref 19–32)
CREATININE: 0.63 mg/dL (ref 0.40–1.20)
Calcium: 9.7 mg/dL (ref 8.4–10.5)
Chloride: 104 mEq/L (ref 96–112)
GFR: 93.84 mL/min (ref >60.00)
Glucose, Bld: 119 mg/dL — ABNORMAL HIGH (ref 70–99)
Potassium: 4 mEq/L (ref 3.5–5.1)
Sodium: 141 mEq/L (ref 135–145)

## 2019-01-05 LAB — LDL CHOLESTEROL, DIRECT: LDL DIRECT: 145 mg/dL

## 2019-01-05 LAB — LIPID PANEL
CHOLESTEROL: 265 mg/dL — AB (ref 0–200)
HDL: 70 mg/dL (ref >39.00)
NonHDL: 195.43
Total CHOL/HDL Ratio: 4
Triglycerides: 205 mg/dL — ABNORMAL HIGH (ref 0.0–149.0)
VLDL: 41 mg/dL — ABNORMAL HIGH (ref 0.0–40.0)

## 2019-01-05 LAB — MAGNESIUM: Magnesium: 2 mg/dL (ref 1.5–2.5)

## 2019-01-05 LAB — TSH: TSH: 1.77 u[IU]/mL (ref 0.35–4.50)

## 2019-01-06 DIAGNOSIS — G8929 Other chronic pain: Secondary | ICD-10-CM | POA: Diagnosis not present

## 2019-01-06 DIAGNOSIS — M5441 Lumbago with sciatica, right side: Secondary | ICD-10-CM | POA: Diagnosis not present

## 2019-01-06 DIAGNOSIS — G35 Multiple sclerosis: Secondary | ICD-10-CM | POA: Diagnosis not present

## 2019-01-06 DIAGNOSIS — E119 Type 2 diabetes mellitus without complications: Secondary | ICD-10-CM | POA: Diagnosis not present

## 2019-01-06 DIAGNOSIS — E669 Obesity, unspecified: Secondary | ICD-10-CM | POA: Diagnosis not present

## 2019-01-06 DIAGNOSIS — F329 Major depressive disorder, single episode, unspecified: Secondary | ICD-10-CM | POA: Diagnosis not present

## 2019-01-07 ENCOUNTER — Telehealth: Payer: Self-pay

## 2019-01-07 ENCOUNTER — Ambulatory Visit (INDEPENDENT_AMBULATORY_CARE_PROVIDER_SITE_OTHER): Payer: Medicare Other | Admitting: Internal Medicine

## 2019-01-07 ENCOUNTER — Ambulatory Visit: Payer: Medicare Other | Admitting: Internal Medicine

## 2019-01-07 DIAGNOSIS — Z472 Encounter for removal of internal fixation device: Secondary | ICD-10-CM

## 2019-01-07 DIAGNOSIS — E7800 Pure hypercholesterolemia, unspecified: Secondary | ICD-10-CM

## 2019-01-07 DIAGNOSIS — M48061 Spinal stenosis, lumbar region without neurogenic claudication: Secondary | ICD-10-CM

## 2019-01-07 DIAGNOSIS — Z86010 Personal history of colonic polyps: Secondary | ICD-10-CM

## 2019-01-07 DIAGNOSIS — D510 Vitamin B12 deficiency anemia due to intrinsic factor deficiency: Secondary | ICD-10-CM

## 2019-01-07 DIAGNOSIS — B199 Unspecified viral hepatitis without hepatic coma: Secondary | ICD-10-CM

## 2019-01-07 DIAGNOSIS — F339 Major depressive disorder, recurrent, unspecified: Secondary | ICD-10-CM | POA: Diagnosis not present

## 2019-01-07 DIAGNOSIS — Z96652 Presence of left artificial knee joint: Secondary | ICD-10-CM

## 2019-01-07 DIAGNOSIS — M4316 Spondylolisthesis, lumbar region: Secondary | ICD-10-CM

## 2019-01-07 DIAGNOSIS — R945 Abnormal results of liver function studies: Secondary | ICD-10-CM | POA: Diagnosis not present

## 2019-01-07 DIAGNOSIS — K5900 Constipation, unspecified: Secondary | ICD-10-CM | POA: Diagnosis not present

## 2019-01-07 DIAGNOSIS — G35 Multiple sclerosis: Secondary | ICD-10-CM

## 2019-01-07 DIAGNOSIS — K648 Other hemorrhoids: Secondary | ICD-10-CM

## 2019-01-07 DIAGNOSIS — Z87891 Personal history of nicotine dependence: Secondary | ICD-10-CM

## 2019-01-07 DIAGNOSIS — Z9049 Acquired absence of other specified parts of digestive tract: Secondary | ICD-10-CM

## 2019-01-07 DIAGNOSIS — M75102 Unspecified rotator cuff tear or rupture of left shoulder, not specified as traumatic: Secondary | ICD-10-CM

## 2019-01-07 DIAGNOSIS — R7989 Other specified abnormal findings of blood chemistry: Secondary | ICD-10-CM

## 2019-01-07 DIAGNOSIS — G43909 Migraine, unspecified, not intractable, without status migrainosus: Secondary | ICD-10-CM

## 2019-01-07 DIAGNOSIS — M5441 Lumbago with sciatica, right side: Secondary | ICD-10-CM

## 2019-01-07 DIAGNOSIS — Z90710 Acquired absence of both cervix and uterus: Secondary | ICD-10-CM

## 2019-01-07 DIAGNOSIS — F329 Major depressive disorder, single episode, unspecified: Secondary | ICD-10-CM

## 2019-01-07 DIAGNOSIS — K649 Unspecified hemorrhoids: Secondary | ICD-10-CM | POA: Diagnosis not present

## 2019-01-07 DIAGNOSIS — N1330 Unspecified hydronephrosis: Secondary | ICD-10-CM

## 2019-01-07 DIAGNOSIS — Z87440 Personal history of urinary (tract) infections: Secondary | ICD-10-CM

## 2019-01-07 DIAGNOSIS — Z9689 Presence of other specified functional implants: Secondary | ICD-10-CM

## 2019-01-07 DIAGNOSIS — E785 Hyperlipidemia, unspecified: Secondary | ICD-10-CM

## 2019-01-07 DIAGNOSIS — G8929 Other chronic pain: Secondary | ICD-10-CM

## 2019-01-07 DIAGNOSIS — Z6827 Body mass index (BMI) 27.0-27.9, adult: Secondary | ICD-10-CM

## 2019-01-07 DIAGNOSIS — E119 Type 2 diabetes mellitus without complications: Secondary | ICD-10-CM

## 2019-01-07 DIAGNOSIS — E669 Obesity, unspecified: Secondary | ICD-10-CM

## 2019-01-07 DIAGNOSIS — K219 Gastro-esophageal reflux disease without esophagitis: Secondary | ICD-10-CM

## 2019-01-07 DIAGNOSIS — F419 Anxiety disorder, unspecified: Secondary | ICD-10-CM

## 2019-01-07 DIAGNOSIS — Z981 Arthrodesis status: Secondary | ICD-10-CM

## 2019-01-07 NOTE — Telephone Encounter (Signed)
Web ex in progress

## 2019-01-07 NOTE — Telephone Encounter (Signed)
Copied from [place] [phone]. Topic: General - Other >> Jan 07, 2019 10:26 AM Leward Quan A wrote: Reason for CRM: Patient son called to say that E mail given before not working so please use [email or web address] and send the link please.

## 2019-01-09 ENCOUNTER — Encounter: Payer: Self-pay | Admitting: Internal Medicine

## 2019-01-09 DIAGNOSIS — R7989 Other specified abnormal findings of blood chemistry: Secondary | ICD-10-CM | POA: Insufficient documentation

## 2019-01-09 DIAGNOSIS — R945 Abnormal results of liver function studies: Secondary | ICD-10-CM | POA: Insufficient documentation

## 2019-01-09 NOTE — Assessment & Plan Note (Signed)
Bowels doing better.  Hemorrhoids better.  Follow.

## 2019-01-09 NOTE — Assessment & Plan Note (Signed)
Being followed by Dr Pearletha Forge.  Stable.

## 2019-01-09 NOTE — Assessment & Plan Note (Signed)
Low cholesterol diet and exercise.  Follow lipid panel.   

## 2019-01-09 NOTE — Assessment & Plan Note (Signed)
Recent liver panel revealed elevated liver enzymes.  Discussed with pt and husband.  Discussed possible fatty liver.  Discussed diet and exercise.  Follow liver function tests.

## 2019-01-09 NOTE — Assessment & Plan Note (Signed)
Bowels doing better.  Feels better.  Follow.  Seeing GI.

## 2019-01-09 NOTE — Assessment & Plan Note (Signed)
Seeing [name].  On cymbalta and remeron.  Overall doing better.  Follow.

## 2019-01-09 NOTE — Progress Notes (Addendum)
Patient ID: Carly [name], female   DOB: 07-23-1951, 68 y.o.   MRN: [id number] Virtual Visit via Video Note  I connected with [name] on 01/07/19 at 11:30 AM EDT by a video enabled telemedicine application and verified that I am speaking with the correct person using two identifiers.  Location patient: home Location provider:work Persons participating in the virtual visit: patient, provider and Ms Bertling's husband.   I discussed the limitations of evaluation and management by telemedicine.  This visit type was conducted due to national recommendations for restrictions regarding the COVID-19 pandemic.  This format is felt to be most appropriate for this patient at this time.   The patient expressed understanding and agreed to proceed.   HPI: She reports she is doing relatively well.  Increased stress and anxiety.  Some stress related to her son's health issues.  Also with her health issues.  She is seeing [name].  Back on remeron.  Also taking cymbalta and lorazepam.  Overall she feels she is doing better.  Husband agrees.  No chest pain.  Breathing stable.  Seeing GI.  Hemorrhoids better.  Taking omeprazole.  Feels stomach and bowels doing better.  Physical therapy has helped.  Getting around ok.  Overall doing better.     ROS: See pertinent positives and negatives per HPI.   Past Medical History:  Diagnosis Date  . Allergy   . Anxiety   . Aspiration pneumonia ([place])   . Depression   . Frequent headaches    H/O  . GERD (gastroesophageal reflux disease)   . History of chicken pox   . History of colon polyps   . Hx of migraines   . Multiple sclerosis ([place])   . PONV (postoperative nausea and vomiting)     Past Surgical History:  Procedure Laterality Date  . BACK SURGERY    . CHOLECYSTECTOMY    . COLONOSCOPY WITH PROPOFOL N/A 05/18/2017   Procedure: COLONOSCOPY WITH PROPOFOL;  Surgeon: [name], MD;  Location: [facility] ENDOSCOPY;  Service: Endoscopy;  Laterality:  N/A;  . FOOT SURGERY  2015  . GALLBLADDER SURGERY  2008  . HARDWARE REMOVAL Left 02/14/2016   Procedure: LEFT FOOT REMOVAL DEEP IMPLANT;  Surgeon: [name], MD;  Location: [facility];  Service: Orthopedics;  Laterality: Left;  . HERNIA REPAIR     Inguinal Hernia Repair  . SPINE SURGERY  2014    Family History  Problem Relation Age of Onset  . Arthritis Mother   . Hypertension Mother   . Macular degeneration Mother   . Hypertension Father   . Hyperlipidemia Father   . Heart disease Maternal Grandfather   . Diabetes Maternal Grandfather   . Kidney disease Paternal Grandmother     SOCIAL HX: reviewed.    Current Outpatient Medications:  .  DULoxetine (CYMBALTA) 60 MG capsule, Take 60 mg by mouth daily., Disp: , Rfl:  .  acetaminophen (TYLENOL) 325 MG tablet, Take 650 mg by mouth every 4 (four) hours as needed., Disp: , Rfl:  .  B Complex Vitamins (VITAMIN B COMPLEX PO), Take by mouth., Disp: , Rfl:  .  Calcium-Phosphorus-Vitamin D (CITRACAL +D3 PO), Take by mouth. 2 tablets twice a day, Disp: , Rfl:  .  Cholecalciferol (D3-1000 PO), Take by mouth., Disp: , Rfl:  .  Coenzyme Q10 (CO Q10) 100 MG CAPS, Take by mouth., Disp: , Rfl:  .  dalfampridine 10 MG TB12, Take 10 mg by mouth every 12 (twelve)  hours., Disp: , Rfl:  .  fluticasone (FLONASE) 50 MCG/ACT nasal spray, Place 2 sprays into both nostrils daily., Disp: 16 g, Rfl: 4 .  hydrocortisone (ANUSOL-HC) 25 MG suppository, Place 1 suppository (25 mg total) rectally 2 (two) times daily., Disp: 12 suppository, Rfl: 0 .  L-Methylfolate-Algae (DEPLIN 15 PO), Take 15 mg by mouth 1 day or 1 dose., Disp: , Rfl:  .  LORazepam (ATIVAN) 0.5 MG tablet, Take 0.5 mg by mouth every 8 (eight) hours as needed., Disp: , Rfl:  .  magnesium oxide (MAG-OX) 400 MG tablet, Take 1 tablet (400 mg total) by mouth daily., Disp: 30 tablet, Rfl: 1 .  mirtazapine (REMERON) 30 MG tablet, Take 30 mg by mouth daily., Disp: , Rfl:  .  ondansetron  (ZOFRAN ODT) 4 MG disintegrating tablet, Take 1 tablet (4 mg total) by mouth every 8 (eight) hours as needed for nausea or vomiting., Disp: 30 tablet, Rfl: 0 .  sodium chloride (V-R NASAL SPRAY SALINE) 0.65 % nasal spray, Place into the nose., Disp: , Rfl:  .  tiZANidine (ZANAFLEX) 4 MG tablet, Take 1 tablet by mouth 3 (three) times daily as needed., Disp: , Rfl:  .  traZODone (DESYREL) 50 MG tablet, Take 50 mg by mouth at bedtime. , Disp: , Rfl: 12 No current facility-administered medications for this visit.   Facility-Administered Medications Ordered in Other Visits:  .  0.9 %  sodium chloride infusion, , Intravenous, Continuous, Corcoran, Melissa C, MD, Last Rate: 10 mL/hr at 04/25/16 0855 .  acetaminophen (TYLENOL) tablet 650 mg, 650 mg, Oral, Once, Corcoran, Drue Second, MD   EXAM:  No physical exam performed given nature of visit.     ASSESSMENT AND PLAN:  Discussed the following assessment and plan:  Constipation, unspecified constipation type  Depression, recurrent (HCC)  Hemorrhoids, unspecified hemorrhoid type  Hypercholesterolemia  Multiple sclerosis ([place])  Abnormal liver function tests - Plan: Hepatic function panel     I discussed the assessment and treatment plan with the patient. The patient was provided an opportunity to ask questions and all were answered. The patient agreed with the plan and demonstrated an understanding of the instructions.   The patient was advised to call back or seek an in-person evaluation if the symptoms worsen or if the condition fails to improve as anticipated.  I provided 25 minutes of non-face-to-face time during this encounter.   Einar Pheasant, MD

## 2019-01-09 NOTE — Addendum Note (Signed)
Addended by: Alisa Graff on: 01/09/2019 01:42 PM   Modules accepted: Orders

## 2019-01-11 DIAGNOSIS — E669 Obesity, unspecified: Secondary | ICD-10-CM | POA: Diagnosis not present

## 2019-01-11 DIAGNOSIS — G8929 Other chronic pain: Secondary | ICD-10-CM | POA: Diagnosis not present

## 2019-01-11 DIAGNOSIS — M5441 Lumbago with sciatica, right side: Secondary | ICD-10-CM | POA: Diagnosis not present

## 2019-01-11 DIAGNOSIS — G35 Multiple sclerosis: Secondary | ICD-10-CM | POA: Diagnosis not present

## 2019-01-11 DIAGNOSIS — E119 Type 2 diabetes mellitus without complications: Secondary | ICD-10-CM | POA: Diagnosis not present

## 2019-01-11 DIAGNOSIS — F329 Major depressive disorder, single episode, unspecified: Secondary | ICD-10-CM | POA: Diagnosis not present

## 2019-01-27 ENCOUNTER — Other Ambulatory Visit: Payer: Self-pay | Admitting: Internal Medicine

## 2019-01-27 ENCOUNTER — Telehealth: Payer: Self-pay

## 2019-01-27 DIAGNOSIS — R2681 Unsteadiness on feet: Secondary | ICD-10-CM

## 2019-01-27 DIAGNOSIS — G35 Multiple sclerosis: Secondary | ICD-10-CM

## 2019-01-27 NOTE — Telephone Encounter (Signed)
Pt requesting home health referral due to mobility issues caused by MS. Do you want to do a visit with her prior to placing referral?

## 2019-01-27 NOTE — Telephone Encounter (Signed)
Order placed for home health referral.  

## 2019-01-27 NOTE — Telephone Encounter (Signed)
Copied from [place] [phone]. Topic: Referral - Request for Referral >> Jan 27, 2019  1:09 PM Ivar Drape wrote: Has patient seen PCP for this complaint?   YES *If NO, is insurance requiring patient see PCP for this issue before PCP can refer them? Referral for which specialty:   [place] Preferred provider/office: [facility]  [phone] Reason for referral:  Problems using leg because of having MS

## 2019-01-27 NOTE — Progress Notes (Signed)
Order placed for home health referral.  

## 2019-01-28 NOTE — Telephone Encounter (Signed)
Pt is aware.  

## 2019-01-31 NOTE — Progress Notes (Signed)
Pt stated [facility] told her they had not received any orders.  [facility] phone # is [phone]/ please advise

## 2019-01-31 NOTE — Progress Notes (Signed)
Advised pt that it may take 7-10 business days to process referrals. Pt stated that was fine.

## 2019-02-03 ENCOUNTER — Telehealth: Payer: Self-pay | Admitting: Internal Medicine

## 2019-02-03 ENCOUNTER — Other Ambulatory Visit: Payer: Medicare Other

## 2019-02-03 DIAGNOSIS — E7800 Pure hypercholesterolemia, unspecified: Secondary | ICD-10-CM | POA: Diagnosis not present

## 2019-02-03 DIAGNOSIS — R2681 Unsteadiness on feet: Secondary | ICD-10-CM | POA: Diagnosis not present

## 2019-02-03 DIAGNOSIS — F339 Major depressive disorder, recurrent, unspecified: Secondary | ICD-10-CM | POA: Diagnosis not present

## 2019-02-03 DIAGNOSIS — G35 Multiple sclerosis: Secondary | ICD-10-CM | POA: Diagnosis not present

## 2019-02-03 NOTE — Telephone Encounter (Signed)
Copied from [place] [phone]. Topic: Quick Communication - Home Health Verbal Orders >> Feb 03, 2019  4:28 PM Yvette Rack wrote: Caller/Agency: Elder Cyphers with [facility]  Callback Number: [phone] with secure voicemail Requesting OT/PT/Skilled Nursing/Social Work/Speech Therapy: PT Frequency: 2 times a week for 4 weeks

## 2019-02-04 NOTE — Telephone Encounter (Signed)
Called and spoke to [employer] w/ [facility] gave verbal orders for PT as requested.

## 2019-02-07 ENCOUNTER — Encounter: Payer: Medicare Other | Admitting: Internal Medicine

## 2019-02-07 ENCOUNTER — Telehealth: Payer: Self-pay | Admitting: Internal Medicine

## 2019-02-07 DIAGNOSIS — F339 Major depressive disorder, recurrent, unspecified: Secondary | ICD-10-CM | POA: Diagnosis not present

## 2019-02-07 DIAGNOSIS — G35 Multiple sclerosis: Secondary | ICD-10-CM | POA: Diagnosis not present

## 2019-02-07 DIAGNOSIS — R2681 Unsteadiness on feet: Secondary | ICD-10-CM | POA: Diagnosis not present

## 2019-02-07 DIAGNOSIS — E7800 Pure hypercholesterolemia, unspecified: Secondary | ICD-10-CM | POA: Diagnosis not present

## 2019-02-07 NOTE — Telephone Encounter (Signed)
Carly [name] with [facility] physical therapist stated patient was in more pain today than usual with back pain, neck and headache , and that patient BP was pre therapy 160/90 pulse 90 and post therapy BP was 150/100 pulse 90-95 , Patient BP last week 138/80 pulse 72.   Back pain  for about 6 days has taken advil,400 mg 3-4 times per day . The advil makes her retain fluid she says she swells, patient in the past has taken Zanaflex in the past for back pain by a Historical provider this seems to help patient  More. I have scheduled patient for tomorrow at 12 on your schedule due to patient admitted she taking some OTC medications she needs to discuss with you,

## 2019-02-07 NOTE — Telephone Encounter (Signed)
Copied from [place] [phone]. Topic: Quick Communication - See Telephone Encounter >> Feb 07, 2019 12:14 PM Burchel, Abbi R wrote: CRM for notification. See Telephone encounter for: 02/07/19.  Jeani Hawking ([facility]) called to notify Dr Nicki Reaper that pt's BP was outside normal range during visit today.   BP (resting) 160/98, (post-PT) 150/100  Lynn: [phone]

## 2019-02-07 NOTE — Telephone Encounter (Signed)
Discussed.  Will wait until appt to refill.

## 2019-02-07 NOTE — Telephone Encounter (Signed)
She wanted to know if you would fill the Zanaflex?

## 2019-02-07 NOTE — Telephone Encounter (Signed)
Ok.  Let me know if she needs anything prior.

## 2019-02-08 ENCOUNTER — Ambulatory Visit (INDEPENDENT_AMBULATORY_CARE_PROVIDER_SITE_OTHER): Payer: Medicare Other | Admitting: Internal Medicine

## 2019-02-08 ENCOUNTER — Encounter: Payer: Self-pay | Admitting: Internal Medicine

## 2019-02-08 ENCOUNTER — Other Ambulatory Visit: Payer: Self-pay

## 2019-02-08 DIAGNOSIS — F339 Major depressive disorder, recurrent, unspecified: Secondary | ICD-10-CM | POA: Diagnosis not present

## 2019-02-08 DIAGNOSIS — G35 Multiple sclerosis: Secondary | ICD-10-CM | POA: Diagnosis not present

## 2019-02-08 DIAGNOSIS — R945 Abnormal results of liver function studies: Secondary | ICD-10-CM

## 2019-02-08 DIAGNOSIS — I10 Essential (primary) hypertension: Secondary | ICD-10-CM | POA: Diagnosis not present

## 2019-02-08 DIAGNOSIS — F418 Other specified anxiety disorders: Secondary | ICD-10-CM | POA: Diagnosis not present

## 2019-02-08 DIAGNOSIS — R7989 Other specified abnormal findings of blood chemistry: Secondary | ICD-10-CM

## 2019-02-08 DIAGNOSIS — M545 Low back pain, unspecified: Secondary | ICD-10-CM

## 2019-02-08 DIAGNOSIS — E7800 Pure hypercholesterolemia, unspecified: Secondary | ICD-10-CM | POA: Diagnosis not present

## 2019-02-08 NOTE — Progress Notes (Signed)
Patient ID: Carly [name], female   DOB: 09/03/1951, 68 y.o.   MRN: [id number] Virtual Visit via Video Note  This visit type was conducted due to national recommendations for restrictions regarding the COVID-19 pandemic (e.g. social distancing).  This format is felt to be most appropriate for this patient at this time.  All issues noted in this document were discussed and addressed.  No physical exam was performed (except for noted visual exam findings with Video Visits).   I connected  Carly [name] on 02/08/19 at 12:00 PM EDT by a video enabled telemedicine application and verified that I am speaking with the correct person using two identifiers. Location patient: home Location provider: work Persons participating in the virtual visit: patient, provider and pts husband.    I discussed the limitations, risks, security and privacy concerns of performing an evaluation and management service by video and the availability of in person appointments. The patient expressed understanding and agreed to proceed.   Reason for visit: work in appt - acute visit.    HPI: Was notified by physical therapy that Carly [name] was in more pain yesterday with PT.  Noticed some increased in back and neck pain.  Blood pressure was elevated 150-160/90-100.  Blood pressure previously 138/80.  States she started having low back pain one week ago.  States had flare.  Has been taking ibuprofen a few times per day.  Back is better.  She started taking tizanidine. Tolerating.  Has helped.  Pain much better.  No chest pain.  No sob.  No known COVID exposure.  No fever.  No acid reflux.  On omeprazole. Bowels stabe.  Has been seeing Carly [name].  Stable on current regimen.     ROS: See pertinent positives and negatives per HPI.  Past Medical History:  Diagnosis Date  . Allergy   . Anxiety   . Aspiration pneumonia ([place])   . Depression   . Frequent headaches    H/O  . GERD (gastroesophageal reflux disease)   .  History of chicken pox   . History of colon polyps   . Hx of migraines   . Multiple sclerosis ([place])   . PONV (postoperative nausea and vomiting)     Past Surgical History:  Procedure Laterality Date  . BACK SURGERY    . CHOLECYSTECTOMY    . COLONOSCOPY WITH PROPOFOL N/A 05/18/2017   Procedure: COLONOSCOPY WITH PROPOFOL;  Surgeon: Carly Silvas, MD;  Location[facility] ENDOSCOPY;  Service: Endoscopy;  Laterality: N/A;  . FOOT SURGERY  2015  . GALLBLADDER SURGERY  2008  . HARDWARE REMOVAL Left 02/14/2016   Procedure: LEFT FOOT REMOVAL DEEP IMPLANT;  Surgeon: Carly Simmer, MD;  Location: [place];  Service: Orthopedics;  Laterality: Left;  . HERNIA REPAIR     Inguinal Hernia Repair  . SPINE SURGERY  2014    Family History  Problem Relation Age of Onset  . Arthritis Mother   . Hypertension Mother   . Macular degeneration Mother   . Hypertension Father   . Hyperlipidemia Father   . Heart disease Maternal Grandfather   . Diabetes Maternal Grandfather   . Kidney disease Paternal Grandmother     SOCIAL HX: reviewed.     Current Outpatient Medications:  .  acetaminophen (TYLENOL) 325 MG tablet, Take 650 mg by mouth every 4 (four) hours as needed., Disp: , Rfl:  .  B Complex Vitamins (VITAMIN B COMPLEX PO), Take by mouth., Disp: , Rfl:  .  Calcium-Phosphorus-Vitamin D (CITRACAL +D3 PO), Take by mouth. 2 tablets twice a day, Disp: , Rfl:  .  Cholecalciferol (D3-1000 PO), Take by mouth., Disp: , Rfl:  .  Coenzyme Q10 (CO Q10) 100 MG CAPS, Take by mouth., Disp: , Rfl:  .  dalfampridine 10 MG TB12, Take 10 mg by mouth every 12 (twelve) hours., Disp: , Rfl:  .  DULoxetine (CYMBALTA) 60 MG capsule, Take 60 mg by mouth daily., Disp: , Rfl:  .  fluticasone (FLONASE) 50 MCG/ACT nasal spray, Place 2 sprays into both nostrils daily., Disp: 16 g, Rfl: 4 .  hydrocortisone (ANUSOL-HC) 25 MG suppository, Place 1 suppository (25 mg total) rectally 2 (two) times daily., Disp: 12  suppository, Rfl: 0 .  L-Methylfolate-Algae (DEPLIN 15 PO), Take 15 mg by mouth 1 day or 1 dose., Disp: , Rfl:  .  LORazepam (ATIVAN) 0.5 MG tablet, Take 0.5 mg by mouth every 8 (eight) hours as needed., Disp: , Rfl:  .  magnesium oxide (MAG-OX) 400 MG tablet, Take 1 tablet (400 mg total) by mouth daily., Disp: 30 tablet, Rfl: 1 .  mirtazapine (REMERON) 30 MG tablet, Take 30 mg by mouth daily., Disp: , Rfl:  .  ondansetron (ZOFRAN ODT) 4 MG disintegrating tablet, Take 1 tablet (4 mg total) by mouth every 8 (eight) hours as needed for nausea or vomiting., Disp: 30 tablet, Rfl: 0 .  sodium chloride (V-R NASAL SPRAY SALINE) 0.65 % nasal spray, Place into the nose., Disp: , Rfl:  .  tiZANidine (ZANAFLEX) 4 MG tablet, Take 1 tablet by mouth 3 (three) times daily as needed., Disp: , Rfl:  .  traZODone (DESYREL) 50 MG tablet, Take 50 mg by mouth at bedtime. , Disp: , Rfl: 12 No current facility-administered medications for this visit.   Facility-Administered Medications Ordered in Other Visits:  .  0.9 %  sodium chloride infusion, , Intravenous, Continuous, Corcoran, Melissa C, MD, Last Rate: 10 mL/hr at 04/25/16 0855 .  acetaminophen (TYLENOL) tablet 650 mg, 650 mg, Oral, Once, Corcoran, Carly Second, MD  EXAM:  GENERAL: alert, oriented, appears well and in no acute distress  HEENT: atraumatic, conjunttiva clear, no obvious abnormalities on inspection of external nose and ears  NECK: normal movements of the head and neck  LUNGS: on inspection no signs of respiratory distress, breathing rate appears normal, no obvious gross SOB, gasping or wheezing  CV: no obvious cyanosis  PSYCH/NEURO: pleasant and cooperative, no obvious depression or anxiety, speech and thought processing grossly intact  ASSESSMENT AND PLAN:  Discussed the following assessment and plan:  Abnormal liver function tests  Midline low back pain without sciatica, unspecified chronicity  Depression with anxiety  Depression,  recurrent (HCC)  Hypercholesterolemia - Plan: Hepatic function panel, Lipid panel  Multiple sclerosis (HCC)  Hypertension, unspecified type - Plan: Basic metabolic panel  Abnormal liver function tests Follow liver function tests.    Back pain Back pain as outlined.  Improved.  On tizanidine.  Stop ibuprofen.  Can take tylenol as directed.  Since better wants to monitor. Notify me if symptoms change or worsen.    Depression with anxiety Followed by Carly [name].  Stable.    Depression, recurrent ([place]) Followed by Carly [name].  Stable on current regimen.    Hypercholesterolemia Low cholesterol diet and exercise.  Follow lipid panel.    Multiple sclerosis Saw Carly Pearletha Forge. Stable.  Working with therapy.    BP (high blood pressure) Blood pressure elevated per physical therapy.  Have her  stop the increased antiinflammatory medication.  Treat the back.  Follow pressures.  Follow metabolic panel.  Send in readings over the next few weeks.      I discussed the assessment and treatment plan with the patient. The patient was provided an opportunity to ask questions and all were answered. The patient agreed with the plan and demonstrated an understanding of the instructions.   The patient was advised to call back or seek an in-person evaluation if the symptoms worsen or if the condition fails to improve as anticipated.    Einar Pheasant, MD

## 2019-02-10 ENCOUNTER — Other Ambulatory Visit: Payer: BC Managed Care – PPO

## 2019-02-10 DIAGNOSIS — G35 Multiple sclerosis: Secondary | ICD-10-CM | POA: Diagnosis not present

## 2019-02-10 DIAGNOSIS — R2681 Unsteadiness on feet: Secondary | ICD-10-CM | POA: Diagnosis not present

## 2019-02-10 DIAGNOSIS — E7800 Pure hypercholesterolemia, unspecified: Secondary | ICD-10-CM | POA: Diagnosis not present

## 2019-02-10 DIAGNOSIS — F339 Major depressive disorder, recurrent, unspecified: Secondary | ICD-10-CM | POA: Diagnosis not present

## 2019-02-12 NOTE — Assessment & Plan Note (Signed)
Follow liver function tests.   

## 2019-02-12 NOTE — Assessment & Plan Note (Signed)
Low cholesterol diet and exercise.  Follow lipid panel.   

## 2019-02-12 NOTE — Assessment & Plan Note (Signed)
Saw Dr Pearletha Forge. Stable.  Working with therapy.

## 2019-02-12 NOTE — Assessment & Plan Note (Signed)
Blood pressure elevated per physical therapy.  Have her stop the increased antiinflammatory medication.  Treat the back.  Follow pressures.  Follow metabolic panel.  Send in readings over the next few weeks.

## 2019-02-12 NOTE — Assessment & Plan Note (Signed)
Followed by Dr Kaur.  Stable.  

## 2019-02-12 NOTE — Assessment & Plan Note (Signed)
Back pain as outlined.  Improved.  On tizanidine.  Stop ibuprofen.  Can take tylenol as directed.  Since better wants to monitor. Notify me if symptoms change or worsen.

## 2019-02-12 NOTE — Assessment & Plan Note (Signed)
Followed by [name].  Stable on current regimen.

## 2019-02-14 ENCOUNTER — Ambulatory Visit: Payer: Medicare Other | Admitting: Internal Medicine

## 2019-02-14 DIAGNOSIS — G35 Multiple sclerosis: Secondary | ICD-10-CM | POA: Diagnosis not present

## 2019-02-14 DIAGNOSIS — E7800 Pure hypercholesterolemia, unspecified: Secondary | ICD-10-CM | POA: Diagnosis not present

## 2019-02-14 DIAGNOSIS — R2681 Unsteadiness on feet: Secondary | ICD-10-CM | POA: Diagnosis not present

## 2019-02-14 DIAGNOSIS — F339 Major depressive disorder, recurrent, unspecified: Secondary | ICD-10-CM | POA: Diagnosis not present

## 2019-02-15 ENCOUNTER — Other Ambulatory Visit: Payer: Self-pay | Admitting: Internal Medicine

## 2019-02-15 ENCOUNTER — Telehealth: Payer: Self-pay | Admitting: Internal Medicine

## 2019-02-15 NOTE — Telephone Encounter (Signed)
Advised per Dr. Nicki Reaper that she could take tylenol ES 1000 mg PRN 2-3 times a day or tylenol arthritis. She is going to let us know if no relief

## 2019-02-15 NOTE — Telephone Encounter (Signed)
Pt saw Dr. Nicki Reaper on 4/28 for her back. Pt's back is still hurting, she really would like to take Advil. Pt was told not to take, she would like to know if there is something else she could take.

## 2019-02-17 DIAGNOSIS — E7800 Pure hypercholesterolemia, unspecified: Secondary | ICD-10-CM

## 2019-02-17 DIAGNOSIS — F419 Anxiety disorder, unspecified: Secondary | ICD-10-CM | POA: Diagnosis not present

## 2019-02-17 DIAGNOSIS — R2681 Unsteadiness on feet: Secondary | ICD-10-CM | POA: Diagnosis not present

## 2019-02-17 DIAGNOSIS — K219 Gastro-esophageal reflux disease without esophagitis: Secondary | ICD-10-CM

## 2019-02-17 DIAGNOSIS — F339 Major depressive disorder, recurrent, unspecified: Secondary | ICD-10-CM | POA: Diagnosis not present

## 2019-02-17 DIAGNOSIS — G35 Multiple sclerosis: Secondary | ICD-10-CM | POA: Diagnosis not present

## 2019-02-21 DIAGNOSIS — E7800 Pure hypercholesterolemia, unspecified: Secondary | ICD-10-CM | POA: Diagnosis not present

## 2019-02-21 DIAGNOSIS — G35 Multiple sclerosis: Secondary | ICD-10-CM | POA: Diagnosis not present

## 2019-02-21 DIAGNOSIS — F339 Major depressive disorder, recurrent, unspecified: Secondary | ICD-10-CM | POA: Diagnosis not present

## 2019-02-21 DIAGNOSIS — R2681 Unsteadiness on feet: Secondary | ICD-10-CM | POA: Diagnosis not present

## 2019-02-23 DIAGNOSIS — E7800 Pure hypercholesterolemia, unspecified: Secondary | ICD-10-CM | POA: Diagnosis not present

## 2019-02-23 DIAGNOSIS — R2681 Unsteadiness on feet: Secondary | ICD-10-CM | POA: Diagnosis not present

## 2019-02-23 DIAGNOSIS — G35 Multiple sclerosis: Secondary | ICD-10-CM | POA: Diagnosis not present

## 2019-02-23 DIAGNOSIS — F339 Major depressive disorder, recurrent, unspecified: Secondary | ICD-10-CM | POA: Diagnosis not present

## 2019-03-02 DIAGNOSIS — R2681 Unsteadiness on feet: Secondary | ICD-10-CM | POA: Diagnosis not present

## 2019-03-02 DIAGNOSIS — F339 Major depressive disorder, recurrent, unspecified: Secondary | ICD-10-CM | POA: Diagnosis not present

## 2019-03-02 DIAGNOSIS — G35 Multiple sclerosis: Secondary | ICD-10-CM | POA: Diagnosis not present

## 2019-03-02 DIAGNOSIS — E7800 Pure hypercholesterolemia, unspecified: Secondary | ICD-10-CM | POA: Diagnosis not present

## 2019-03-04 DIAGNOSIS — G35 Multiple sclerosis: Secondary | ICD-10-CM | POA: Diagnosis not present

## 2019-03-04 DIAGNOSIS — R2681 Unsteadiness on feet: Secondary | ICD-10-CM | POA: Diagnosis not present

## 2019-03-04 DIAGNOSIS — F339 Major depressive disorder, recurrent, unspecified: Secondary | ICD-10-CM | POA: Diagnosis not present

## 2019-03-04 DIAGNOSIS — E7800 Pure hypercholesterolemia, unspecified: Secondary | ICD-10-CM | POA: Diagnosis not present

## 2019-03-05 DIAGNOSIS — E7800 Pure hypercholesterolemia, unspecified: Secondary | ICD-10-CM | POA: Diagnosis not present

## 2019-03-05 DIAGNOSIS — G35 Multiple sclerosis: Secondary | ICD-10-CM | POA: Diagnosis not present

## 2019-03-05 DIAGNOSIS — F339 Major depressive disorder, recurrent, unspecified: Secondary | ICD-10-CM | POA: Diagnosis not present

## 2019-03-05 DIAGNOSIS — R2681 Unsteadiness on feet: Secondary | ICD-10-CM | POA: Diagnosis not present

## 2019-03-08 ENCOUNTER — Telehealth: Payer: Self-pay | Admitting: Internal Medicine

## 2019-03-08 NOTE — Telephone Encounter (Signed)
Called and spoke with Carly [name] at [facility].  Gave verbal orders for pt as requested.

## 2019-03-08 NOTE — Telephone Encounter (Signed)
Copied from [place] [id number]. Topic: Quick Communication - Home Health Verbal Orders >> Mar 08, 2019  9:52 AM Erick Blinks wrote: Caller/Agency: [facility] Number: [phone] okay to leave message. Please advise Requesting OT/PT/Skilled Nursing/Social Work/Speech Therapy: To continue home health pt  Frequency: 1 week 1  2 week 1 1 a week for 3 weeks

## 2019-03-10 DIAGNOSIS — G35 Multiple sclerosis: Secondary | ICD-10-CM | POA: Diagnosis not present

## 2019-03-10 DIAGNOSIS — R2681 Unsteadiness on feet: Secondary | ICD-10-CM | POA: Diagnosis not present

## 2019-03-10 DIAGNOSIS — E7800 Pure hypercholesterolemia, unspecified: Secondary | ICD-10-CM | POA: Diagnosis not present

## 2019-03-10 DIAGNOSIS — F339 Major depressive disorder, recurrent, unspecified: Secondary | ICD-10-CM | POA: Diagnosis not present

## 2019-03-14 ENCOUNTER — Telehealth: Payer: Self-pay | Admitting: *Deleted

## 2019-03-14 ENCOUNTER — Telehealth: Payer: Self-pay | Admitting: Family Medicine

## 2019-03-14 ENCOUNTER — Ambulatory Visit (INDEPENDENT_AMBULATORY_CARE_PROVIDER_SITE_OTHER): Payer: Medicare Other | Admitting: Family Medicine

## 2019-03-14 ENCOUNTER — Other Ambulatory Visit: Payer: Self-pay

## 2019-03-14 ENCOUNTER — Ambulatory Visit: Payer: BC Managed Care – PPO | Admitting: Family Medicine

## 2019-03-14 ENCOUNTER — Other Ambulatory Visit: Payer: BC Managed Care – PPO

## 2019-03-14 DIAGNOSIS — J3489 Other specified disorders of nose and nasal sinuses: Secondary | ICD-10-CM | POA: Diagnosis not present

## 2019-03-14 DIAGNOSIS — Z20822 Contact with and (suspected) exposure to covid-19: Secondary | ICD-10-CM

## 2019-03-14 DIAGNOSIS — R05 Cough: Secondary | ICD-10-CM

## 2019-03-14 DIAGNOSIS — Z20828 Contact with and (suspected) exposure to other viral communicable diseases: Secondary | ICD-10-CM

## 2019-03-14 DIAGNOSIS — R481 Agnosia: Secondary | ICD-10-CM

## 2019-03-14 DIAGNOSIS — R6889 Other general symptoms and signs: Secondary | ICD-10-CM

## 2019-03-14 DIAGNOSIS — R059 Cough, unspecified: Secondary | ICD-10-CM

## 2019-03-14 DIAGNOSIS — R0981 Nasal congestion: Secondary | ICD-10-CM

## 2019-03-14 MED ORDER — AZITHROMYCIN 250 MG PO TABS: ORAL_TABLET | ORAL | 0 refills | Status: DC

## 2019-03-14 NOTE — Telephone Encounter (Signed)
Carly [name]  [birth year]  [id number]  Reason for test: cough, congestion, loss of taste. Pt has [place],  [id number]

## 2019-03-14 NOTE — Progress Notes (Signed)
Patient ID: Carly [name], female   DOB: 1950-12-10, 68 y.o.   MRN: [id number]    Virtual Visit via video Note  This visit type was conducted due to national recommendations for restrictions regarding the COVID-19 pandemic (e.g. social distancing).  This format is felt to be most appropriate for this patient at this time.  All issues noted in this document were discussed and addressed.  No physical exam was performed (except for noted visual exam findings with Video Visits).   I connected with Carly [name] today at  9:20 AM EDT by a video enabled telemedicine application and verified that I am speaking with the correct person using two identifiers. Location patient: home Location provider: [facility] Persons participating in the virtual visit: patient, provider  I discussed the limitations, risks, security and privacy concerns of performing an evaluation and management service by video and the availability of in person appointments. I also discussed with the patient that there may be a patient responsible charge related to this service. The patient expressed understanding and agreed to proceed.  HPI: Patient and I connected via video due to complaints of cough and nasal congestion.  Patient states the nasal congestion has been present about 2 to 3 weeks.  The cough seems to have gotten worse over the last week, and also reports decrease in appetite, feeling more tired and also loss of taste.  Patient has not gone anywhere in the past many weeks.  She has been remaining home due to the coronavirus pandemic.  Denies fever or chills.  Denies shortness of breath or wheezing.  Denies chest pain.  No nausea, vomiting or diarrhea. States sometimes when she blows her nose it is a little white, but mainly the drainage is clear.  Does have a history of seasonal allergies and has been using Flonase and Claritin to help reduce congestion.   ROS: See pertinent positives and negatives per HPI.   Past Medical History:  Diagnosis Date  . Allergy   . Anxiety   . Aspiration pneumonia ([place])   . Depression   . Frequent headaches    H/O  . GERD (gastroesophageal reflux disease)   . History of chicken pox   . History of colon polyps   . Hx of migraines   . Multiple sclerosis ([place])   . PONV (postoperative nausea and vomiting)     Past Surgical History:  Procedure Laterality Date  . BACK SURGERY    . CHOLECYSTECTOMY    . COLONOSCOPY WITH PROPOFOL N/A 05/18/2017   Procedure: COLONOSCOPY WITH PROPOFOL;  Surgeon: [name], MD;  Location: [facility] ENDOSCOPY;  Service: Endoscopy;  Laterality: N/A;  . FOOT SURGERY  2015  . GALLBLADDER SURGERY  2008  . HARDWARE REMOVAL Left 02/14/2016   Procedure: LEFT FOOT REMOVAL DEEP IMPLANT;  Surgeon: [name], MD;  Location: [place];  Service: Orthopedics;  Laterality: Left;  . HERNIA REPAIR     Inguinal Hernia Repair  . SPINE SURGERY  2014    Family History  Problem Relation Age of Onset  . Arthritis Mother   . Hypertension Mother   . Macular degeneration Mother   . Hypertension Father   . Hyperlipidemia Father   . Heart disease Maternal Grandfather   . Diabetes Maternal Grandfather   . Kidney disease Paternal Grandmother    Social History   Tobacco Use  . Smoking status: Never Smoker  . Smokeless tobacco: Never Used  Substance Use Topics  . Alcohol use:  No    Alcohol/week: 0.0 standard drinks    Current Outpatient Medications:  .  acetaminophen (TYLENOL) 325 MG tablet, Take 650 mg by mouth every 4 (four) hours as needed., Disp: , Rfl:  .  B Complex Vitamins (VITAMIN B COMPLEX PO), Take by mouth., Disp: , Rfl:  .  Calcium-Phosphorus-Vitamin D (CITRACAL +D3 PO), Take by mouth. 2 tablets twice a day, Disp: , Rfl:  .  Cholecalciferol (D3-1000 PO), Take by mouth., Disp: , Rfl:  .  Coenzyme Q10 (CO Q10) 100 MG CAPS, Take by mouth., Disp: , Rfl:  .  dalfampridine 10 MG TB12, Take 10 mg by mouth every 12  (twelve) hours., Disp: , Rfl:  .  DULoxetine (CYMBALTA) 60 MG capsule, Take 60 mg by mouth daily., Disp: , Rfl:  .  fluticasone (FLONASE) 50 MCG/ACT nasal spray, Place 2 sprays into both nostrils daily., Disp: 16 g, Rfl: 4 .  hydrocortisone (ANUSOL-HC) 25 MG suppository, Place 1 suppository (25 mg total) rectally 2 (two) times daily., Disp: 12 suppository, Rfl: 0 .  L-Methylfolate-Algae (DEPLIN 15 PO), Take 15 mg by mouth 1 day or 1 dose., Disp: , Rfl:  .  LORazepam (ATIVAN) 0.5 MG tablet, Take 0.5 mg by mouth every 8 (eight) hours as needed., Disp: , Rfl:  .  magnesium oxide (MAG-OX) 400 MG tablet, TAKE ONE TABLET BY MOUTH DAILY, Disp: 30 tablet, Rfl: 1 .  mirtazapine (REMERON) 30 MG tablet, Take 30 mg by mouth daily., Disp: , Rfl:  .  ondansetron (ZOFRAN ODT) 4 MG disintegrating tablet, Take 1 tablet (4 mg total) by mouth every 8 (eight) hours as needed for nausea or vomiting., Disp: 30 tablet, Rfl: 0 .  sodium chloride (V-R NASAL SPRAY SALINE) 0.65 % nasal spray, Place into the nose., Disp: , Rfl:  .  tiZANidine (ZANAFLEX) 4 MG tablet, Take 1 tablet by mouth 3 (three) times daily as needed., Disp: , Rfl:  .  traZODone (DESYREL) 50 MG tablet, Take 50 mg by mouth at bedtime. , Disp: , Rfl: 12 No current facility-administered medications for this visit.   Facility-Administered Medications Ordered in Other Visits:  .  0.9 %  sodium chloride infusion, , Intravenous, Continuous, Corcoran, Melissa C, MD, Last Rate: 10 mL/hr at 04/25/16 0855 .  acetaminophen (TYLENOL) tablet 650 mg, 650 mg, Oral, Once, Corcoran, Drue Second, MD  EXAM:  GENERAL: alert, oriented, appears well and in no acute distress  HEENT: atraumatic, conjunttiva clear, no obvious abnormalities on inspection of external nose and ears  NECK: normal movements of the head and neck  LUNGS: on inspection no signs of respiratory distress, breathing rate appears normal, no obvious gross SOB, gasping or wheezing  CV: no obvious cyanosis   MS: moves all visible extremities without noticeable abnormality  PSYCH/NEURO: pleasant and cooperative, no obvious depression or anxiety, speech and thought processing grossly intact  ASSESSMENT AND PLAN:  Discussed the following assessment and plan:  Suspected 2019 Novel Coronavirus Infection  Sinus congestion - Plan: azithromycin (ZITHROMAX) 250 MG tablet  Cough  Nasal drainage  Loss of perception for taste  Patient advised that her symptoms potentially could be related to COVID-19 and I highly recommend she be tested.  Patient first seemed resistant to being tested because she has not gone anywhere however after discussion importance of testing and tracking COVID-19 she is agreeable to go get tested.  I will place orders and patient is aware someone will be calling her to set up appointment time to go  over to the testing facility.  She is also been advised to continue to remain home with she has been other than going to get her COVID-19 test, keep up with fluid intake, get plenty of rest, use Tylenol as needed for any aches or pains, do good handwashing.  She can continue to use Flonase and allergy medication for nasal congestion.  Advise she can use over-the-counter Robitussin as needed for cough and we will also cover her with a Z-Pak to try any sort of sinus/respiratory infection that could be underlying.  She is aware that we will contact her with results of her COVID-19 test and reach out to her in the next few days to follow-up and see how she is feeling.  Advised if symptoms worsen in any way to call office right away for further advice on next steps.   I discussed the assessment and treatment plan with the patient. The patient was provided an opportunity to ask questions and all were answered. The patient agreed with the plan and demonstrated an understanding of the instructions.   The patient was advised to call back or seek an in-person evaluation if the symptoms worsen or if  the condition fails to improve as anticipated.  Jodelle Green, FNP

## 2019-03-14 NOTE — Telephone Encounter (Signed)
Patient called and scheduled for COVID-19 testing as requested by Ander Purpura, NP. Appt on 03/14/19 at 3:45 pm at the [facility] testing site. Pt advised that anyone in the car will need to wear a mask and to remain in the car at the time of the appt. Understanding verbalized.

## 2019-03-14 NOTE — Telephone Encounter (Signed)
Pt called and scheduled for testing on 6/1/0 at the [facility] site.

## 2019-03-15 ENCOUNTER — Encounter: Payer: Self-pay | Admitting: Family Medicine

## 2019-03-15 LAB — NOVEL CORONAVIRUS, NAA: SARS-CoV-2, NAA: NOT DETECTED

## 2019-03-17 DIAGNOSIS — E7800 Pure hypercholesterolemia, unspecified: Secondary | ICD-10-CM | POA: Diagnosis not present

## 2019-03-17 DIAGNOSIS — R2681 Unsteadiness on feet: Secondary | ICD-10-CM | POA: Diagnosis not present

## 2019-03-17 DIAGNOSIS — F339 Major depressive disorder, recurrent, unspecified: Secondary | ICD-10-CM | POA: Diagnosis not present

## 2019-03-17 DIAGNOSIS — G35 Multiple sclerosis: Secondary | ICD-10-CM | POA: Diagnosis not present

## 2019-03-18 DIAGNOSIS — R2681 Unsteadiness on feet: Secondary | ICD-10-CM | POA: Diagnosis not present

## 2019-03-18 DIAGNOSIS — E7800 Pure hypercholesterolemia, unspecified: Secondary | ICD-10-CM | POA: Diagnosis not present

## 2019-03-18 DIAGNOSIS — G35 Multiple sclerosis: Secondary | ICD-10-CM | POA: Diagnosis not present

## 2019-03-18 DIAGNOSIS — F339 Major depressive disorder, recurrent, unspecified: Secondary | ICD-10-CM | POA: Diagnosis not present

## 2019-03-22 DIAGNOSIS — E7800 Pure hypercholesterolemia, unspecified: Secondary | ICD-10-CM | POA: Diagnosis not present

## 2019-03-22 DIAGNOSIS — G35 Multiple sclerosis: Secondary | ICD-10-CM | POA: Diagnosis not present

## 2019-03-22 DIAGNOSIS — F339 Major depressive disorder, recurrent, unspecified: Secondary | ICD-10-CM | POA: Diagnosis not present

## 2019-03-22 DIAGNOSIS — R2681 Unsteadiness on feet: Secondary | ICD-10-CM | POA: Diagnosis not present

## 2019-03-23 DIAGNOSIS — R05 Cough: Secondary | ICD-10-CM | POA: Diagnosis not present

## 2019-03-23 DIAGNOSIS — H6123 Impacted cerumen, bilateral: Secondary | ICD-10-CM | POA: Diagnosis not present

## 2019-03-29 DIAGNOSIS — Z86010 Personal history of colonic polyps: Secondary | ICD-10-CM | POA: Diagnosis not present

## 2019-03-29 DIAGNOSIS — D802 Selective deficiency of immunoglobulin A [IgA]: Secondary | ICD-10-CM | POA: Diagnosis not present

## 2019-03-29 DIAGNOSIS — K5900 Constipation, unspecified: Secondary | ICD-10-CM | POA: Diagnosis not present

## 2019-03-29 DIAGNOSIS — R110 Nausea: Secondary | ICD-10-CM | POA: Diagnosis not present

## 2019-03-29 DIAGNOSIS — R748 Abnormal levels of other serum enzymes: Secondary | ICD-10-CM | POA: Diagnosis not present

## 2019-04-04 DIAGNOSIS — R748 Abnormal levels of other serum enzymes: Secondary | ICD-10-CM | POA: Diagnosis not present

## 2019-04-05 ENCOUNTER — Other Ambulatory Visit: Payer: Self-pay | Admitting: Nurse Practitioner

## 2019-04-05 DIAGNOSIS — R748 Abnormal levels of other serum enzymes: Secondary | ICD-10-CM

## 2019-04-07 DIAGNOSIS — L821 Other seborrheic keratosis: Secondary | ICD-10-CM | POA: Diagnosis not present

## 2019-04-07 DIAGNOSIS — D2230 Melanocytic nevi of unspecified part of face: Secondary | ICD-10-CM | POA: Diagnosis not present

## 2019-04-07 DIAGNOSIS — D1800 Hemangioma unspecified site: Secondary | ICD-10-CM | POA: Diagnosis not present

## 2019-04-07 DIAGNOSIS — D225 Melanocytic nevi of trunk: Secondary | ICD-10-CM | POA: Diagnosis not present

## 2019-04-07 DIAGNOSIS — B353 Tinea pedis: Secondary | ICD-10-CM | POA: Diagnosis not present

## 2019-04-07 DIAGNOSIS — Z1283 Encounter for screening for malignant neoplasm of skin: Secondary | ICD-10-CM | POA: Diagnosis not present

## 2019-04-07 DIAGNOSIS — Z85828 Personal history of other malignant neoplasm of skin: Secondary | ICD-10-CM | POA: Diagnosis not present

## 2019-04-07 DIAGNOSIS — D2270 Melanocytic nevi of unspecified lower limb, including hip: Secondary | ICD-10-CM | POA: Diagnosis not present

## 2019-04-07 DIAGNOSIS — D2260 Melanocytic nevi of unspecified upper limb, including shoulder: Secondary | ICD-10-CM | POA: Diagnosis not present

## 2019-04-07 DIAGNOSIS — L812 Freckles: Secondary | ICD-10-CM | POA: Diagnosis not present

## 2019-04-11 ENCOUNTER — Ambulatory Visit
Admission: RE | Admit: 2019-04-11 | Discharge: 2019-04-11 | Disposition: A | Discharge status: Home / Self Care | Payer: Medicare Other | Source: Ambulatory Visit | Attending: Nurse Practitioner | Admitting: Nurse Practitioner

## 2019-04-11 ENCOUNTER — Other Ambulatory Visit: Payer: Self-pay

## 2019-04-11 DIAGNOSIS — R748 Abnormal levels of other serum enzymes: Secondary | ICD-10-CM | POA: Diagnosis not present

## 2019-04-11 DIAGNOSIS — R945 Abnormal results of liver function studies: Secondary | ICD-10-CM | POA: Diagnosis not present

## 2019-04-12 ENCOUNTER — Other Ambulatory Visit: Payer: Self-pay | Admitting: Internal Medicine

## 2019-04-12 DIAGNOSIS — Z1231 Encounter for screening mammogram for malignant neoplasm of breast: Secondary | ICD-10-CM

## 2019-04-19 DIAGNOSIS — J301 Allergic rhinitis due to pollen: Secondary | ICD-10-CM | POA: Diagnosis not present

## 2019-04-19 DIAGNOSIS — R51 Headache: Secondary | ICD-10-CM | POA: Diagnosis not present

## 2019-04-21 DIAGNOSIS — H2513 Age-related nuclear cataract, bilateral: Secondary | ICD-10-CM | POA: Diagnosis not present

## 2019-04-28 ENCOUNTER — Telehealth: Payer: Self-pay

## 2019-04-28 NOTE — Telephone Encounter (Signed)
Did you call this pt?

## 2019-04-28 NOTE — Telephone Encounter (Signed)
Copied from [place] [phone]. Topic: General - Other >> Apr 28, 2019  3:44 PM Leward Quan A wrote: Reason for CRM: Patient called stated that she had a call from an Ebony Hail and was returning the call. Can be reached at Ph# [phone]

## 2019-04-29 NOTE — Telephone Encounter (Signed)
Yes I did to schedule AWV with Denisa at [facility].  7.17.20-LVM again for pt to call me back at [phone]  Thank you

## 2019-05-02 DIAGNOSIS — M7918 Myalgia, other site: Secondary | ICD-10-CM | POA: Diagnosis not present

## 2019-05-02 DIAGNOSIS — M5413 Radiculopathy, cervicothoracic region: Secondary | ICD-10-CM | POA: Diagnosis not present

## 2019-05-02 DIAGNOSIS — M9901 Segmental and somatic dysfunction of cervical region: Secondary | ICD-10-CM | POA: Diagnosis not present

## 2019-05-02 DIAGNOSIS — M5412 Radiculopathy, cervical region: Secondary | ICD-10-CM | POA: Diagnosis not present

## 2019-05-02 DIAGNOSIS — M542 Cervicalgia: Secondary | ICD-10-CM | POA: Diagnosis not present

## 2019-05-03 ENCOUNTER — Telehealth: Payer: Self-pay

## 2019-05-03 ENCOUNTER — Ambulatory Visit: Payer: Medicare Other

## 2019-05-03 DIAGNOSIS — M5412 Radiculopathy, cervical region: Secondary | ICD-10-CM | POA: Diagnosis not present

## 2019-05-03 DIAGNOSIS — M5413 Radiculopathy, cervicothoracic region: Secondary | ICD-10-CM | POA: Diagnosis not present

## 2019-05-03 DIAGNOSIS — M542 Cervicalgia: Secondary | ICD-10-CM | POA: Diagnosis not present

## 2019-05-03 DIAGNOSIS — M7918 Myalgia, other site: Secondary | ICD-10-CM | POA: Diagnosis not present

## 2019-05-03 DIAGNOSIS — M9901 Segmental and somatic dysfunction of cervical region: Secondary | ICD-10-CM | POA: Diagnosis not present

## 2019-05-03 NOTE — Telephone Encounter (Signed)
Connected with patient for annual wellness visit. Phone call was disconnected. Please call nurse health advisor [phone].

## 2019-05-03 NOTE — Progress Notes (Signed)
Subjective:   Carly [name] is a 68 y.o. female who presents for Medicare Annual (Subsequent) preventive examination.  Review of Systems:  No ROS.  Medicare Wellness Virtual Visit.  Visual/audio telehealth visit, UTA vital signs.   See social history for additional risk factors.         Objective:     Vitals: There were no vitals taken for this visit.  There is no height or weight on file to calculate BMI.  Advanced Directives 12/20/2017 11/17/2017 10/14/2017 05/18/2017 04/22/2017 03/10/2017 02/24/2017  Does Patient Have a Medical Advance Directive? Yes Yes Yes Yes Yes Yes No  Type of Paramedic of [place];Living will Living will;Healthcare Power of [place];Living will - [place];Living will -  Does patient want to make changes to medical advance directive? No - Patient declined No - Patient declined - - - No - Patient declined -  Copy of [place] in Chart? - No - copy requested - No - copy requested - No - copy requested -  Would patient like information on creating a medical advance directive? - - - - - - No - Patient declined    Tobacco Social History   Tobacco Use  Smoking Status Never Smoker  Smokeless Tobacco Never Used     Counseling given: Not Answered   Clinical Intake:                       Past Medical History:  Diagnosis Date  . Allergy   . Anxiety   . Aspiration pneumonia ([place])   . Depression   . Frequent headaches    H/O  . GERD (gastroesophageal reflux disease)   . History of chicken pox   . History of colon polyps   . Hx of migraines   . Multiple sclerosis ([place])   . PONV (postoperative nausea and vomiting)    Past Surgical History:  Procedure Laterality Date  . BACK SURGERY    . CHOLECYSTECTOMY    . COLONOSCOPY WITH PROPOFOL N/A 05/18/2017   Procedure: COLONOSCOPY WITH PROPOFOL;  Surgeon: [name], MD;  Location: [facility] ENDOSCOPY;   Service: Endoscopy;  Laterality: N/A;  . FOOT SURGERY  2015  . GALLBLADDER SURGERY  2008  . HARDWARE REMOVAL Left 02/14/2016   Procedure: LEFT FOOT REMOVAL DEEP IMPLANT;  Surgeon: [name], MD;  Location: [facility];  Service: Orthopedics;  Laterality: Left;  . HERNIA REPAIR     Inguinal Hernia Repair  . SPINE SURGERY  2014   Family History  Problem Relation Age of Onset  . Arthritis Mother   . Hypertension Mother   . Macular degeneration Mother   . Hypertension Father   . Hyperlipidemia Father   . Heart disease Maternal Grandfather   . Diabetes Maternal Grandfather   . Kidney disease Paternal Grandmother    Social History   Socioeconomic History  . Marital status: Married    Spouse name: Not on file  . Number of children: Not on file  . Years of education: Not on file  . Highest education level: Not on file  Occupational History  . Not on file  Social Needs  . Financial resource strain: Not on file  . Food insecurity    Worry: Not on file    Inability: Not on file  . Transportation needs    Medical: Not on file    Non-medical: Not on  file  Tobacco Use  . Smoking status: Never Smoker  . Smokeless tobacco: Never Used  Substance and Sexual Activity  . Alcohol use: No    Alcohol/week: 0.0 standard drinks  . Drug use: No  . Sexual activity: Not Currently  Lifestyle  . Physical activity    Days per week: Not on file    Minutes per session: Not on file  . Stress: Not on file  Relationships  . Social Herbalist on phone: Not on file    Gets together: Not on file    Attends religious service: Not on file    Active member of club or organization: Not on file    Attends meetings of clubs or organizations: Not on file    Relationship status: Not on file  Other Topics Concern  . Not on file  Social History Narrative   Married     Outpatient Encounter Medications as of 05/03/2019  Medication Sig  . acetaminophen (TYLENOL) 325 MG tablet  Take 650 mg by mouth every 4 (four) hours as needed.  Marland Kitchen azithromycin (ZITHROMAX) 250 MG tablet Take 2 tablets on day 1, take 1 tablet on days 2-5  . B Complex Vitamins (VITAMIN B COMPLEX PO) Take by mouth.  . Calcium-Phosphorus-Vitamin D (CITRACAL +D3 PO) Take by mouth. 2 tablets twice a day  . Cholecalciferol (D3-1000 PO) Take by mouth.  . Coenzyme Q10 (CO Q10) 100 MG CAPS Take by mouth.  . dalfampridine 10 MG TB12 Take 10 mg by mouth every 12 (twelve) hours.  . DULoxetine (CYMBALTA) 60 MG capsule Take 60 mg by mouth daily.  . fluticasone (FLONASE) 50 MCG/ACT nasal spray Place 2 sprays into both nostrils daily.  . hydrocortisone (ANUSOL-HC) 25 MG suppository Place 1 suppository (25 mg total) rectally 2 (two) times daily.  Marland Kitchen L-Methylfolate-Algae (DEPLIN 15 PO) Take 15 mg by mouth 1 day or 1 dose.  Marland Kitchen LORazepam (ATIVAN) 0.5 MG tablet Take 0.5 mg by mouth every 8 (eight) hours as needed.  . magnesium oxide (MAG-OX) 400 MG tablet TAKE ONE TABLET BY MOUTH DAILY  . mirtazapine (REMERON) 30 MG tablet Take 30 mg by mouth daily.  . ondansetron (ZOFRAN ODT) 4 MG disintegrating tablet Take 1 tablet (4 mg total) by mouth every 8 (eight) hours as needed for nausea or vomiting.  . sodium chloride (V-R NASAL SPRAY SALINE) 0.65 % nasal spray Place into the nose.  Marland Kitchen tiZANidine (ZANAFLEX) 4 MG tablet Take 1 tablet by mouth 3 (three) times daily as needed.  . traZODone (DESYREL) 50 MG tablet Take 50 mg by mouth at bedtime.    Facility-Administered Encounter Medications as of 05/03/2019  Medication  . 0.9 %  sodium chloride infusion  . acetaminophen (TYLENOL) tablet 650 mg    Activities of Daily Living No flowsheet data found.  Patient Care Team: Einar Pheasant, MD as PCP - General (Internal Medicine) Jacky Kindle., MD as Referring Physician (Psychiatry)    Assessment:   This is a routine wellness examination for Carly [name].  I connected with patient 05/03/19 at 10:00 AM EDT by an audio enabled  telemedicine application and verified that I am speaking with the correct person using two identifiers. Patient stated full name and DOB. Patient gave permission to continue with virtual visit. Patient's location was at home and Nurse's location was at [place] office.   Unable to complete today's annual wellness visit.  Patient stated she was going to the restroom and call disconnected.  Patient  called back to reschedule for another day. Tentatively scheduled to complete annual wellness visit 05/05/19 at 1130.          Varney Biles, LPN  9/[id number]/[id number]   Reviewed above information.  Appt rescheduled.    Dr Nicki Reaper

## 2019-05-04 ENCOUNTER — Other Ambulatory Visit: Payer: Self-pay

## 2019-05-04 ENCOUNTER — Ambulatory Visit: Payer: Medicare Other

## 2019-05-05 ENCOUNTER — Ambulatory Visit: Payer: Self-pay

## 2019-05-05 DIAGNOSIS — M542 Cervicalgia: Secondary | ICD-10-CM | POA: Diagnosis not present

## 2019-05-05 DIAGNOSIS — M5412 Radiculopathy, cervical region: Secondary | ICD-10-CM | POA: Diagnosis not present

## 2019-05-05 DIAGNOSIS — M5413 Radiculopathy, cervicothoracic region: Secondary | ICD-10-CM | POA: Diagnosis not present

## 2019-05-05 DIAGNOSIS — M9901 Segmental and somatic dysfunction of cervical region: Secondary | ICD-10-CM | POA: Diagnosis not present

## 2019-05-05 DIAGNOSIS — M7918 Myalgia, other site: Secondary | ICD-10-CM | POA: Diagnosis not present

## 2019-05-10 DIAGNOSIS — M7918 Myalgia, other site: Secondary | ICD-10-CM | POA: Diagnosis not present

## 2019-05-10 DIAGNOSIS — K5904 Chronic idiopathic constipation: Secondary | ICD-10-CM | POA: Diagnosis not present

## 2019-05-10 DIAGNOSIS — M9901 Segmental and somatic dysfunction of cervical region: Secondary | ICD-10-CM | POA: Diagnosis not present

## 2019-05-10 DIAGNOSIS — F419 Anxiety disorder, unspecified: Secondary | ICD-10-CM | POA: Diagnosis not present

## 2019-05-10 DIAGNOSIS — M5412 Radiculopathy, cervical region: Secondary | ICD-10-CM | POA: Diagnosis not present

## 2019-05-10 DIAGNOSIS — M5413 Radiculopathy, cervicothoracic region: Secondary | ICD-10-CM | POA: Diagnosis not present

## 2019-05-10 DIAGNOSIS — M542 Cervicalgia: Secondary | ICD-10-CM | POA: Diagnosis not present

## 2019-05-16 DIAGNOSIS — M7918 Myalgia, other site: Secondary | ICD-10-CM | POA: Diagnosis not present

## 2019-05-16 DIAGNOSIS — M5413 Radiculopathy, cervicothoracic region: Secondary | ICD-10-CM | POA: Diagnosis not present

## 2019-05-16 DIAGNOSIS — M542 Cervicalgia: Secondary | ICD-10-CM | POA: Diagnosis not present

## 2019-05-16 DIAGNOSIS — M5412 Radiculopathy, cervical region: Secondary | ICD-10-CM | POA: Diagnosis not present

## 2019-05-16 DIAGNOSIS — M9901 Segmental and somatic dysfunction of cervical region: Secondary | ICD-10-CM | POA: Diagnosis not present

## 2019-05-17 DIAGNOSIS — K5901 Slow transit constipation: Secondary | ICD-10-CM | POA: Diagnosis not present

## 2019-05-17 DIAGNOSIS — Z86010 Personal history of colonic polyps: Secondary | ICD-10-CM | POA: Diagnosis not present

## 2019-05-17 DIAGNOSIS — R768 Other specified abnormal immunological findings in serum: Secondary | ICD-10-CM | POA: Diagnosis not present

## 2019-05-17 DIAGNOSIS — E669 Obesity, unspecified: Secondary | ICD-10-CM | POA: Diagnosis not present

## 2019-05-17 DIAGNOSIS — K760 Fatty (change of) liver, not elsewhere classified: Secondary | ICD-10-CM | POA: Diagnosis not present

## 2019-05-18 DIAGNOSIS — M5413 Radiculopathy, cervicothoracic region: Secondary | ICD-10-CM | POA: Diagnosis not present

## 2019-05-18 DIAGNOSIS — M9901 Segmental and somatic dysfunction of cervical region: Secondary | ICD-10-CM | POA: Diagnosis not present

## 2019-05-18 DIAGNOSIS — M542 Cervicalgia: Secondary | ICD-10-CM | POA: Diagnosis not present

## 2019-05-18 DIAGNOSIS — M5412 Radiculopathy, cervical region: Secondary | ICD-10-CM | POA: Diagnosis not present

## 2019-05-18 DIAGNOSIS — M7918 Myalgia, other site: Secondary | ICD-10-CM | POA: Diagnosis not present

## 2019-05-19 ENCOUNTER — Telehealth: Payer: Self-pay | Admitting: Internal Medicine

## 2019-05-19 ENCOUNTER — Ambulatory Visit
Admission: RE | Admit: 2019-05-19 | Discharge: 2019-05-19 | Disposition: A | Discharge status: Home / Self Care | Payer: Medicare Other | Source: Ambulatory Visit | Attending: Internal Medicine | Admitting: Internal Medicine

## 2019-05-19 DIAGNOSIS — M5412 Radiculopathy, cervical region: Secondary | ICD-10-CM | POA: Diagnosis not present

## 2019-05-19 DIAGNOSIS — Z1231 Encounter for screening mammogram for malignant neoplasm of breast: Secondary | ICD-10-CM | POA: Diagnosis not present

## 2019-05-19 DIAGNOSIS — M9901 Segmental and somatic dysfunction of cervical region: Secondary | ICD-10-CM | POA: Diagnosis not present

## 2019-05-19 DIAGNOSIS — M542 Cervicalgia: Secondary | ICD-10-CM | POA: Diagnosis not present

## 2019-05-19 DIAGNOSIS — M7918 Myalgia, other site: Secondary | ICD-10-CM | POA: Diagnosis not present

## 2019-05-19 DIAGNOSIS — M5413 Radiculopathy, cervicothoracic region: Secondary | ICD-10-CM | POA: Diagnosis not present

## 2019-05-19 NOTE — Telephone Encounter (Signed)
PT's husband called. He is concerned about his wife, showing dementia. Would like to speak to Dr. Nicki Reaper. Does not want to bring his wife into office wants to speak to her by himself.

## 2019-05-19 NOTE — Telephone Encounter (Signed)
Error

## 2019-05-20 ENCOUNTER — Other Ambulatory Visit: Payer: Self-pay | Admitting: Internal Medicine

## 2019-05-20 DIAGNOSIS — N6489 Other specified disorders of breast: Secondary | ICD-10-CM

## 2019-05-20 DIAGNOSIS — R928 Other abnormal and inconclusive findings on diagnostic imaging of breast: Secondary | ICD-10-CM

## 2019-05-20 NOTE — Telephone Encounter (Signed)
Please return call to husband only at [phone].  Please do not call home number.

## 2019-05-20 NOTE — Telephone Encounter (Signed)
Left message for husband to call back.

## 2019-05-20 NOTE — Telephone Encounter (Signed)
Husband has scheduled appt to discuss pts memory

## 2019-05-20 NOTE — Telephone Encounter (Signed)
LMTCB

## 2019-05-23 ENCOUNTER — Telehealth: Payer: Self-pay

## 2019-05-23 NOTE — Telephone Encounter (Signed)
Pt called back concerned about her recent mammogram.  Pt is concerned about the findings and wanted to know why she needed to be scheduled a f/u ultrasound.  Pt is requesting a call back from Dr. Nicki Reaper.    Pt said that she is having anxiety about waiting until her appt on 8/[facility].  Recommended that [facility] call [facility] to see about moving appt up or getting on a cancellation list if she wants to get in sooner.  Pt would like for Dr. Nicki Reaper to call her.

## 2019-05-24 DIAGNOSIS — M5413 Radiculopathy, cervicothoracic region: Secondary | ICD-10-CM | POA: Diagnosis not present

## 2019-05-24 DIAGNOSIS — M7918 Myalgia, other site: Secondary | ICD-10-CM | POA: Diagnosis not present

## 2019-05-24 DIAGNOSIS — M9901 Segmental and somatic dysfunction of cervical region: Secondary | ICD-10-CM | POA: Diagnosis not present

## 2019-05-24 DIAGNOSIS — M5412 Radiculopathy, cervical region: Secondary | ICD-10-CM | POA: Diagnosis not present

## 2019-05-24 DIAGNOSIS — M542 Cervicalgia: Secondary | ICD-10-CM | POA: Diagnosis not present

## 2019-05-25 ENCOUNTER — Ambulatory Visit
Admission: RE | Admit: 2019-05-25 | Discharge: 2019-05-25 | Disposition: A | Discharge status: Home / Self Care | Payer: Medicare Other | Source: Ambulatory Visit | Attending: Internal Medicine | Admitting: Internal Medicine

## 2019-05-25 ENCOUNTER — Encounter: Payer: Self-pay | Admitting: Internal Medicine

## 2019-05-25 ENCOUNTER — Ambulatory Visit (INDEPENDENT_AMBULATORY_CARE_PROVIDER_SITE_OTHER): Payer: Medicare Other | Admitting: Internal Medicine

## 2019-05-25 ENCOUNTER — Other Ambulatory Visit: Payer: Self-pay

## 2019-05-25 DIAGNOSIS — N6489 Other specified disorders of breast: Secondary | ICD-10-CM | POA: Diagnosis not present

## 2019-05-25 DIAGNOSIS — F418 Other specified anxiety disorders: Secondary | ICD-10-CM

## 2019-05-25 DIAGNOSIS — R928 Other abnormal and inconclusive findings on diagnostic imaging of breast: Secondary | ICD-10-CM | POA: Diagnosis not present

## 2019-05-25 DIAGNOSIS — Z710 Person encountering health services to consult on behalf of another person: Secondary | ICD-10-CM

## 2019-05-25 DIAGNOSIS — R2681 Unsteadiness on feet: Secondary | ICD-10-CM

## 2019-05-25 DIAGNOSIS — F339 Major depressive disorder, recurrent, unspecified: Secondary | ICD-10-CM

## 2019-05-25 DIAGNOSIS — R413 Other amnesia: Secondary | ICD-10-CM

## 2019-05-25 DIAGNOSIS — R922 Inconclusive mammogram: Secondary | ICD-10-CM | POA: Diagnosis not present

## 2019-05-25 NOTE — Telephone Encounter (Signed)
Pt had mammogram done this morning. Husband stated to disregard.

## 2019-05-25 NOTE — Progress Notes (Signed)
Patient ID: Carly [name], female   DOB: 02-17-1951, 68 y.o.   MRN: [id number]   Virtual Visit via telephone Note  This visit type was conducted due to national recommendations for restrictions regarding the COVID-19 pandemic (e.g. social distancing).  This format is felt to be most appropriate for this patient at this time.  All issues noted in this document were discussed and addressed.  No physical exam was performed (except for noted visual exam findings with Video Visits).   I connected with Carly [name] today by telephone and verified that I am speaking with the correct person using two identifiers. Location patient: home Location provider: work Persons participating in the telephone visit: patient, provider  I discussed the limitations, risks, security and privacy concerns of performing an evaluation and management service by telephone and the availability of in person appointments.v The patient expressed understanding and agreed to proceed.   Reason for visit: work in appt.   HPI: Carly [name] called wanting an appointment to discuss some concerns about his Carly [name].  He reports her memory is "not good".  Husband takes care of most things around the house.  Takes care of her medications.  She repeats questions.  Not driving.  She experiences increased anxiety with covid, restrictions and her health.  Has MS.  Some days more limited with movement.  Anxiety worsens this.  Was having increased head and neck pain.  Went to chiropractor and had acupuncture.  Improved.  She has fallen a couple of times.  Last two weeks ago. He had questions about moving to [facility], etc.  Discussed with him.  Discussed further evaluation and w/up.     ROS: See pertinent positives and negatives per HPI.  Past Medical History:  Diagnosis Date  . Allergy   . Anxiety   . Aspiration pneumonia ([place])   . Depression   . Frequent headaches    H/O  . GERD (gastroesophageal reflux disease)   . History of  chicken pox   . History of colon polyps   . Hx of migraines   . Multiple sclerosis ([place])   . PONV (postoperative nausea and vomiting)     Past Surgical History:  Procedure Laterality Date  . BACK SURGERY    . CHOLECYSTECTOMY    . COLONOSCOPY WITH PROPOFOL N/A 05/18/2017   Procedure: COLONOSCOPY WITH PROPOFOL;  Surgeon: [name], MD;  Location: [facility] ENDOSCOPY;  Service: Endoscopy;  Laterality: N/A;  . FOOT SURGERY  2015  . GALLBLADDER SURGERY  2008  . HARDWARE REMOVAL Left 02/14/2016   Procedure: LEFT FOOT REMOVAL DEEP IMPLANT;  Surgeon: [name], MD;  Location: [facility];  Service: Orthopedics;  Laterality: Left;  . HERNIA REPAIR     Inguinal Hernia Repair  . SPINE SURGERY  2014    Family History  Problem Relation Age of Onset  . Arthritis Mother   . Hypertension Mother   . Macular degeneration Mother   . Hypertension Father   . Hyperlipidemia Father   . Heart disease Maternal Grandfather   . Diabetes Maternal Grandfather   . Kidney disease Paternal Grandmother     SOCIAL HX: reviewed.    Current Outpatient Medications:  .  acetaminophen (TYLENOL) 325 MG tablet, Take 650 mg by mouth every 4 (four) hours as needed., Disp: , Rfl:  .  azithromycin (ZITHROMAX) 250 MG tablet, Take 2 tablets on day 1, take 1 tablet on days 2-5, Disp: 6 tablet, Rfl: 0 .  B  Complex Vitamins (VITAMIN B COMPLEX PO), Take by mouth., Disp: , Rfl:  .  Calcium-Phosphorus-Vitamin D (CITRACAL +D3 PO), Take by mouth. 2 tablets twice a day, Disp: , Rfl:  .  Cholecalciferol (D3-1000 PO), Take by mouth., Disp: , Rfl:  .  Coenzyme Q10 (CO Q10) 100 MG CAPS, Take by mouth., Disp: , Rfl:  .  dalfampridine 10 MG TB12, Take 10 mg by mouth every 12 (twelve) hours., Disp: , Rfl:  .  DULoxetine (CYMBALTA) 60 MG capsule, Take 60 mg by mouth daily., Disp: , Rfl:  .  fluticasone (FLONASE) 50 MCG/ACT nasal spray, Place 2 sprays into both nostrils daily., Disp: 16 g, Rfl: 4 .  hydrocortisone  (ANUSOL-HC) 25 MG suppository, Place 1 suppository (25 mg total) rectally 2 (two) times daily., Disp: 12 suppository, Rfl: 0 .  L-Methylfolate-Algae (DEPLIN 15 PO), Take 15 mg by mouth 1 day or 1 dose., Disp: , Rfl:  .  LORazepam (ATIVAN) 0.5 MG tablet, Take 0.5 mg by mouth every 8 (eight) hours as needed., Disp: , Rfl:  .  magnesium oxide (MAG-OX) 400 MG tablet, TAKE ONE TABLET BY MOUTH DAILY, Disp: 30 tablet, Rfl: 1 .  mirtazapine (REMERON) 30 MG tablet, Take 30 mg by mouth daily., Disp: , Rfl:  .  ondansetron (ZOFRAN ODT) 4 MG disintegrating tablet, Take 1 tablet (4 mg total) by mouth every 8 (eight) hours as needed for nausea or vomiting., Disp: 30 tablet, Rfl: 0 .  sodium chloride (V-R NASAL SPRAY SALINE) 0.65 % nasal spray, Place into the nose., Disp: , Rfl:  .  tiZANidine (ZANAFLEX) 4 MG tablet, Take 1 tablet by mouth 3 (three) times daily as needed., Disp: , Rfl:  .  traZODone (DESYREL) 50 MG tablet, Take 50 mg by mouth at bedtime. , Disp: , Rfl: 12 No current facility-administered medications for this visit.   Facility-Administered Medications Ordered in Other Visits:  .  0.9 %  sodium chloride infusion, , Intravenous, Continuous, Corcoran, Melissa C, MD, Last Rate: 10 mL/hr at 04/25/16 0855 .  acetaminophen (TYLENOL) tablet 650 mg, 650 mg, Oral, Once, Corcoran, Drue Second, MD  EXAM:  No exam given nature of visit.  Carly Nanney requested visit with Carly [name] not present.    ASSESSMENT AND PLAN:  Discussed the following assessment and plan:  Unsteady gait Has been to therapy. Discussed exercise.    Depression, recurrent ([place]) Followed by [name].  Discussed importance of continued follow up.    Depression with anxiety Increased anxiety recently.  Being followed by psychiatry ([name]).  Plans to f/u with her.   Memory change Memory change as outlined.  Discussed with her husband today.  Discussed further w/up and evaluation.  She has an upcoming appt with me.  Will d/w her then.   He wants to hold on further testing until I can speak with her.      I discussed the assessment and treatment plan with the patient. The patient was provided an opportunity to ask questions and all were answered. The patient agreed with the plan and demonstrated an understanding of the instructions.   The patient was advised to call back or seek an in-person evaluation if the symptoms worsen or if the condition fails to improve as anticipated.  I provided 25 minutes of non-face-to-face time during this encounter.   Einar Pheasant, MD

## 2019-05-26 DIAGNOSIS — M5413 Radiculopathy, cervicothoracic region: Secondary | ICD-10-CM | POA: Diagnosis not present

## 2019-05-26 DIAGNOSIS — M5412 Radiculopathy, cervical region: Secondary | ICD-10-CM | POA: Diagnosis not present

## 2019-05-26 DIAGNOSIS — M9901 Segmental and somatic dysfunction of cervical region: Secondary | ICD-10-CM | POA: Diagnosis not present

## 2019-05-26 DIAGNOSIS — M542 Cervicalgia: Secondary | ICD-10-CM | POA: Diagnosis not present

## 2019-05-26 DIAGNOSIS — M7918 Myalgia, other site: Secondary | ICD-10-CM | POA: Diagnosis not present

## 2019-05-29 ENCOUNTER — Encounter: Payer: Self-pay | Admitting: Internal Medicine

## 2019-05-29 DIAGNOSIS — R413 Other amnesia: Secondary | ICD-10-CM | POA: Insufficient documentation

## 2019-05-29 NOTE — Assessment & Plan Note (Signed)
Increased anxiety recently.  Being followed by psychiatry ([name]).  Plans to f/u with her.

## 2019-05-29 NOTE — Assessment & Plan Note (Signed)
Followed by [name].  Discussed importance of continued follow up.

## 2019-05-29 NOTE — Assessment & Plan Note (Signed)
Memory change as outlined.  Discussed with her husband today.  Discussed further w/up and evaluation.  She has an upcoming appt with me.  Will d/w her then.  He wants to hold on further testing until I can speak with her.

## 2019-05-29 NOTE — Assessment & Plan Note (Signed)
Has been to therapy. Discussed exercise.

## 2019-05-30 NOTE — Addendum Note (Signed)
Addended by: Alisa Graff on: 05/30/2019 01:19 PM   Modules accepted: Level of Service

## 2019-06-02 ENCOUNTER — Other Ambulatory Visit: Payer: Medicare Other

## 2019-06-02 ENCOUNTER — Encounter: Payer: Self-pay | Admitting: Internal Medicine

## 2019-06-02 ENCOUNTER — Other Ambulatory Visit: Payer: Self-pay

## 2019-06-02 ENCOUNTER — Ambulatory Visit (INDEPENDENT_AMBULATORY_CARE_PROVIDER_SITE_OTHER): Payer: Medicare Other | Admitting: Internal Medicine

## 2019-06-02 ENCOUNTER — Telehealth: Payer: Self-pay | Admitting: *Deleted

## 2019-06-02 ENCOUNTER — Ambulatory Visit: Payer: Medicare Other

## 2019-06-02 VITALS — Ht 63.0 in | Wt 165.0 lb

## 2019-06-02 DIAGNOSIS — Z20822 Contact with and (suspected) exposure to covid-19: Secondary | ICD-10-CM

## 2019-06-02 DIAGNOSIS — R059 Cough, unspecified: Secondary | ICD-10-CM

## 2019-06-02 DIAGNOSIS — R05 Cough: Secondary | ICD-10-CM | POA: Diagnosis not present

## 2019-06-02 NOTE — Progress Notes (Signed)
Patient ID: Carly [name], female   DOB: 1951/06/28, 68 y.o.   MRN: [id number]   Virtual Visit via video Note  This visit type was conducted due to national recommendations for restrictions regarding the COVID-19 pandemic (e.g. social distancing).  This format is felt to be most appropriate for this patient at this time.  All issues noted in this document were discussed and addressed.  No physical exam was performed (except for noted visual exam findings with Video Visits).   I connected with [name] by a video enabled telemedicine application and verified that I am speaking with the correct person using two identifiers. Location patient: home Location provider: work Persons participating in the video visit: patient, provider  I discussed the limitations, risks, security and privacy concerns of performing an evaluation and management service by video and the availability of in person appointments.  The patient expressed understanding and agreed to proceed.   Reason for visit: acute visit  HPI: Reports that starting 3-4 days ago, she developed some increased cough.  No fever.  Some drainage with increased sinus pressure and nasal congestion.  No sore throat.  No earache.  No headache.  No chest congestion or sob.  No chest pain.  No abdominal pain.  Still has issues with constipation.  Has had bowel movement the last two days.  Wants to be tested for covid.     ROS: See pertinent positives and negatives per HPI.  Past Medical History:  Diagnosis Date  . Allergy   . Anxiety   . Aspiration pneumonia ([place])   . Depression   . Frequent headaches    H/O  . GERD (gastroesophageal reflux disease)   . History of chicken pox   . History of colon polyps   . Hx of migraines   . Multiple sclerosis ([place])   . PONV (postoperative nausea and vomiting)     Past Surgical History:  Procedure Laterality Date  . BACK SURGERY    . CHOLECYSTECTOMY    . COLONOSCOPY WITH PROPOFOL N/A  05/18/2017   Procedure: COLONOSCOPY WITH PROPOFOL;  Surgeon: [name], MD;  Location[facility] ENDOSCOPY;  Service: Endoscopy;  Laterality: N/A;  . FOOT SURGERY  2015  . GALLBLADDER SURGERY  2008  . HARDWARE REMOVAL Left 02/14/2016   Procedure: LEFT FOOT REMOVAL DEEP IMPLANT;  Surgeon: [name], MD;  Location: [facility];  Service: Orthopedics;  Laterality: Left;  . HERNIA REPAIR     Inguinal Hernia Repair  . SPINE SURGERY  2014    Family History  Problem Relation Age of Onset  . Arthritis Mother   . Hypertension Mother   . Macular degeneration Mother   . Hypertension Father   . Hyperlipidemia Father   . Heart disease Maternal Grandfather   . Diabetes Maternal Grandfather   . Kidney disease Paternal Grandmother     SOCIAL HX: reviewed.    Current Outpatient Medications:  .  acetaminophen (TYLENOL) 325 MG tablet, Take 650 mg by mouth every 4 (four) hours as needed., Disp: , Rfl:  .  azithromycin (ZITHROMAX) 250 MG tablet, Take 2 tablets on day 1, take 1 tablet on days 2-5, Disp: 6 tablet, Rfl: 0 .  B Complex Vitamins (VITAMIN B COMPLEX PO), Take by mouth., Disp: , Rfl:  .  Calcium-Phosphorus-Vitamin D (CITRACAL +D3 PO), Take by mouth. 2 tablets twice a day, Disp: , Rfl:  .  Cholecalciferol (D3-1000 PO), Take by mouth., Disp: , Rfl:  .  Coenzyme  Q10 (CO Q10) 100 MG CAPS, Take by mouth., Disp: , Rfl:  .  dalfampridine 10 MG TB12, Take 10 mg by mouth every 12 (twelve) hours., Disp: , Rfl:  .  DULoxetine (CYMBALTA) 60 MG capsule, Take 60 mg by mouth daily., Disp: , Rfl:  .  fluticasone (FLONASE) 50 MCG/ACT nasal spray, Place 2 sprays into both nostrils daily., Disp: 16 g, Rfl: 4 .  hydrocortisone (ANUSOL-HC) 25 MG suppository, Place 1 suppository (25 mg total) rectally 2 (two) times daily., Disp: 12 suppository, Rfl: 0 .  L-Methylfolate-Algae (DEPLIN 15 PO), Take 15 mg by mouth 1 day or 1 dose., Disp: , Rfl:  .  magnesium oxide (MAG-OX) 400 MG tablet, TAKE ONE  TABLET BY MOUTH DAILY, Disp: 30 tablet, Rfl: 1 .  mirtazapine (REMERON) 30 MG tablet, Take 30 mg by mouth daily., Disp: , Rfl:  .  ondansetron (ZOFRAN ODT) 4 MG disintegrating tablet, Take 1 tablet (4 mg total) by mouth every 8 (eight) hours as needed for nausea or vomiting., Disp: 30 tablet, Rfl: 0 .  oxazepam (SERAX) 10 MG capsule, Take 10 mg by mouth at bedtime as needed for sleep or anxiety., Disp: , Rfl:  .  sodium chloride (V-R NASAL SPRAY SALINE) 0.65 % nasal spray, Place into the nose., Disp: , Rfl:  .  tiZANidine (ZANAFLEX) 4 MG tablet, Take 1 tablet by mouth 3 (three) times daily as needed., Disp: , Rfl:  .  traZODone (DESYREL) 50 MG tablet, Take 50 mg by mouth at bedtime. , Disp: , Rfl: 12 No current facility-administered medications for this visit.   Facility-Administered Medications Ordered in Other Visits:  .  0.9 %  sodium chloride infusion, , Intravenous, Continuous, Corcoran, Melissa C, MD, Last Rate: 10 mL/hr at 04/25/16 0855 .  acetaminophen (TYLENOL) tablet 650 mg, 650 mg, Oral, Once, Corcoran, Melissa C, MD  EXAM:  GENERAL: alert, oriented, appears well and in no acute distress  HEENT: atraumatic, conjunttiva clear, no obvious abnormalities on inspection of external nose and ears  NECK: normal movements of the head and neck  LUNGS: on inspection no signs of respiratory distress, breathing rate appears normal, no obvious gross SOB, gasping or wheezing  CV: no obvious cyanosis  PSYCH/NEURO: pleasant and cooperative, no obvious depression or anxiety, speech and thought processing grossly intact  ASSESSMENT AND PLAN:  Discussed the following assessment and plan:  Cough Cough and congestion as outlined.  No fever.  Some sinus pressure and drainage.  No deep chest congestion or sob.  Feel more related to allergies.  Pt wants covid testing.  Discussed testing and the need to self quarantine.  She is staying in now.  Discussed following symptoms and the need for  evaluation if symptoms change or worsen.  Treat with saline nasal spray and nasacort nasal spray as directed.  Robitussin as directed.  Hold abx.  Follow.      I discussed the assessment and treatment plan with the patient. The patient was provided an opportunity to ask questions and all were answered. The patient agreed with the plan and demonstrated an understanding of the instructions.   The patient was advised to call back or seek an in-person evaluation if the symptoms worsen or if the condition fails to improve as anticipated.   Einar Pheasant, MD

## 2019-06-02 NOTE — Telephone Encounter (Signed)
Patient dry cough, no fever,chills or body aches,  Just has cough x 2 times, has no known exposure. Patient is having fatigue, and muscle aches in legs, sense of taste and smell normal. Scheduled with PCP Doxy @ 2 today.

## 2019-06-02 NOTE — Telephone Encounter (Signed)
Copied from [place] [id number]. Topic: General - Other >> Jun 02, 2019  9:52 AM Carly [name] wrote: Reason for CRM: pt has been coughing for 3 days and want to have a covid test

## 2019-06-03 LAB — NOVEL CORONAVIRUS, NAA: SARS-CoV-2, NAA: NOT DETECTED

## 2019-06-04 ENCOUNTER — Encounter: Payer: Self-pay | Admitting: Internal Medicine

## 2019-06-06 ENCOUNTER — Encounter: Payer: Self-pay | Admitting: Internal Medicine

## 2019-06-06 DIAGNOSIS — M9900 Segmental and somatic dysfunction of head region: Secondary | ICD-10-CM | POA: Diagnosis not present

## 2019-06-06 DIAGNOSIS — M5413 Radiculopathy, cervicothoracic region: Secondary | ICD-10-CM | POA: Diagnosis not present

## 2019-06-06 DIAGNOSIS — M9901 Segmental and somatic dysfunction of cervical region: Secondary | ICD-10-CM | POA: Diagnosis not present

## 2019-06-06 DIAGNOSIS — M7918 Myalgia, other site: Secondary | ICD-10-CM | POA: Diagnosis not present

## 2019-06-06 DIAGNOSIS — R05 Cough: Secondary | ICD-10-CM | POA: Insufficient documentation

## 2019-06-06 DIAGNOSIS — M542 Cervicalgia: Secondary | ICD-10-CM | POA: Diagnosis not present

## 2019-06-06 DIAGNOSIS — R059 Cough, unspecified: Secondary | ICD-10-CM | POA: Insufficient documentation

## 2019-06-06 DIAGNOSIS — R51 Headache: Secondary | ICD-10-CM | POA: Diagnosis not present

## 2019-06-06 DIAGNOSIS — M5412 Radiculopathy, cervical region: Secondary | ICD-10-CM | POA: Diagnosis not present

## 2019-06-06 NOTE — Assessment & Plan Note (Signed)
Cough and congestion as outlined.  No fever.  Some sinus pressure and drainage.  No deep chest congestion or sob.  Feel more related to allergies.  Pt wants covid testing.  Discussed testing and the need to self quarantine.  She is staying in now.  Discussed following symptoms and the need for evaluation if symptoms change or worsen.  Treat with saline nasal spray and nasacort nasal spray as directed.  Robitussin as directed.  Hold abx.  Follow.

## 2019-06-07 ENCOUNTER — Encounter: Payer: Self-pay | Admitting: Family Medicine

## 2019-06-07 ENCOUNTER — Ambulatory Visit (INDEPENDENT_AMBULATORY_CARE_PROVIDER_SITE_OTHER): Payer: Medicare Other | Admitting: Family Medicine

## 2019-06-07 ENCOUNTER — Other Ambulatory Visit: Payer: Self-pay

## 2019-06-07 VITALS — Ht 63.0 in | Wt 165.0 lb

## 2019-06-07 DIAGNOSIS — J029 Acute pharyngitis, unspecified: Secondary | ICD-10-CM

## 2019-06-07 DIAGNOSIS — J302 Other seasonal allergic rhinitis: Secondary | ICD-10-CM | POA: Diagnosis not present

## 2019-06-07 DIAGNOSIS — R51 Headache: Secondary | ICD-10-CM | POA: Diagnosis not present

## 2019-06-07 DIAGNOSIS — R519 Headache, unspecified: Secondary | ICD-10-CM

## 2019-06-07 MED ORDER — FLUTICASONE PROPIONATE 50 MCG/ACT NA SUSP: 1.0000 | Freq: Every day | NASAL | 2 refills | Status: DC

## 2019-06-07 MED ORDER — FEXOFENADINE HCL 180 MG PO TABS: 180.0000 mg | ORAL_TABLET | Freq: Every day | ORAL | 2 refills | Status: DC

## 2019-06-07 NOTE — Progress Notes (Signed)
Patient ID: Carly [name], female   DOB: December 12, 1950, 68 y.o.   MRN: [id number]    Virtual Visit via video Note  This visit type was conducted due to national recommendations for restrictions regarding the COVID-19 pandemic (e.g. social distancing).  This format is felt to be most appropriate for this patient at this time.  All issues noted in this document were discussed and addressed.  No physical exam was performed (except for noted visual exam findings with Video Visits).   I connected with Carly [name] today at  9:20 AM EDT by a video enabled telemedicine application or telephone and verified that I am speaking with the correct person using two identifiers. Location patient: home Location provider: work or home office Persons participating in the virtual visit: patient, provider  I discussed the limitations, risks, security and privacy concerns of performing an evaluation and management service by video and the availability of in person appointments. I also discussed with the patient that there may be a patient responsible charge related to this service. The patient expressed understanding and agreed to proceed.   HPI:   Patient and I connected via video due to complaint of continued sore throat and sinus pressure/headache.   She was tested for COVID 19 on 06/02/2019 and result was NEGATIVE; had these similar symptoms in June 2020 and COVID 19 test was NEGATIVE then as well.  Denies fever or chills.  States she did have a slight cough, but seems better today.  Main complaint is sinus congestion, sinus headache across forehead, sinus drainage down back of throat.  States drainage mainly is clear.  She does not take any sort of antihistamine or nasal spray regularly.  States she has been taking Sudafed every day for the past 5 days, but this only makes things to drain more and has not dried anything out.  Denies shortness of breath or wheezing.  Denies chest pain.  Denies nausea,  vomiting or diarrhea.  Appetite has been normal, been trying to keep up good fluid intake.  ROS: See pertinent positives and negatives per HPI.  Past Medical History:  Diagnosis Date  . Allergy   . Anxiety   . Aspiration pneumonia ([place])   . Depression   . Frequent headaches    H/O  . GERD (gastroesophageal reflux disease)   . History of chicken pox   . History of colon polyps   . Hx of migraines   . Multiple sclerosis ([place])   . PONV (postoperative nausea and vomiting)     Past Surgical History:  Procedure Laterality Date  . BACK SURGERY    . CHOLECYSTECTOMY    . COLONOSCOPY WITH PROPOFOL N/A 05/18/2017   Procedure: COLONOSCOPY WITH PROPOFOL;  Surgeon: [name], MD;  Location: [facility] ENDOSCOPY;  Service: Endoscopy;  Laterality: N/A;  . FOOT SURGERY  2015  . GALLBLADDER SURGERY  2008  . HARDWARE REMOVAL Left 02/14/2016   Procedure: LEFT FOOT REMOVAL DEEP IMPLANT;  Surgeon: [name], MD;  Location: [place];  Service: Orthopedics;  Laterality: Left;  . HERNIA REPAIR     Inguinal Hernia Repair  . SPINE SURGERY  2014    Family History  Problem Relation Age of Onset  . Arthritis Mother   . Hypertension Mother   . Macular degeneration Mother   . Hypertension Father   . Hyperlipidemia Father   . Heart disease Maternal Grandfather   . Diabetes Maternal Grandfather   . Kidney disease Paternal Grandmother  Social History   Tobacco Use  . Smoking status: Never Smoker  . Smokeless tobacco: Never Used  Substance Use Topics  . Alcohol use: No    Alcohol/week: 0.0 standard drinks    Current Outpatient Medications:  .  acetaminophen (TYLENOL) 325 MG tablet, Take 650 mg by mouth every 4 (four) hours as needed., Disp: , Rfl:  .  B Complex Vitamins (VITAMIN B COMPLEX PO), Take by mouth., Disp: , Rfl:  .  Calcium-Phosphorus-Vitamin D (CITRACAL +D3 PO), Take by mouth. 2 tablets twice a day, Disp: , Rfl:  .  Cholecalciferol (D3-1000 PO), Take by mouth.,  Disp: , Rfl:  .  Coenzyme Q10 (CO Q10) 100 MG CAPS, Take by mouth., Disp: , Rfl:  .  dalfampridine 10 MG TB12, Take 10 mg by mouth every 12 (twelve) hours., Disp: , Rfl:  .  DULoxetine (CYMBALTA) 60 MG capsule, Take 60 mg by mouth daily., Disp: , Rfl:  .  fluticasone (FLONASE) 50 MCG/ACT nasal spray, Place 2 sprays into both nostrils daily., Disp: 16 g, Rfl: 4 .  hydrocortisone (ANUSOL-HC) 25 MG suppository, Place 1 suppository (25 mg total) rectally 2 (two) times daily., Disp: 12 suppository, Rfl: 0 .  L-Methylfolate-Algae (DEPLIN 15 PO), Take 15 mg by mouth 1 day or 1 dose., Disp: , Rfl:  .  magnesium oxide (MAG-OX) 400 MG tablet, TAKE ONE TABLET BY MOUTH DAILY, Disp: 30 tablet, Rfl: 1 .  mirtazapine (REMERON) 30 MG tablet, Take 30 mg by mouth daily., Disp: , Rfl:  .  ondansetron (ZOFRAN ODT) 4 MG disintegrating tablet, Take 1 tablet (4 mg total) by mouth every 8 (eight) hours as needed for nausea or vomiting., Disp: 30 tablet, Rfl: 0 .  oxazepam (SERAX) 10 MG capsule, Take 10 mg by mouth at bedtime as needed for sleep or anxiety., Disp: , Rfl:  .  sodium chloride (V-R NASAL SPRAY SALINE) 0.65 % nasal spray, Place into the nose., Disp: , Rfl:  .  tiZANidine (ZANAFLEX) 4 MG tablet, Take 1 tablet by mouth 3 (three) times daily as needed., Disp: , Rfl:  .  traZODone (DESYREL) 50 MG tablet, Take 50 mg by mouth at bedtime. , Disp: , Rfl: 12 No current facility-administered medications for this visit.   Facility-Administered Medications Ordered in Other Visits:  .  0.9 %  sodium chloride infusion, , Intravenous, Continuous, Corcoran, Melissa C, MD, Last Rate: 10 mL/hr at 04/25/16 0855 .  acetaminophen (TYLENOL) tablet 650 mg, 650 mg, Oral, Once, Corcoran, Melissa C, MD  EXAM:   GENERAL: alert, oriented, appears well and in no acute distress  HEENT: atraumatic, conjunttiva clear, no obvious abnormalities on inspection of external nose and ears  NECK: normal movements of the head and neck   LUNGS: on inspection no signs of respiratory distress, breathing rate appears normal, no obvious gross SOB, gasping or wheezing  CV: no obvious cyanosis  MS: moves all visible extremities without noticeable abnormality  PSYCH/NEURO: pleasant and cooperative, no obvious depression or anxiety, speech and thought processing grossly intact.   ASSESSMENT AND PLAN:  Discussed the following assessment and plan:  Seasonal allergies/sinus headache - strongly suspect patient's sinus congestion, drainage, sinus headache and sore throat are all related to seasonal allergies.  Advised to take oral antihistamine daily and use Flonase nasal spray daily.  I will send in a prescription for Allegra 180 mg and Flonase nasal spray.  Patient encouraged to be consistent with these medicines to help keep allergy symptoms at [place].  Also advised to keep up good fluid intake, do good handwashing and continue to practice good precautions due to COVID-19 pandemic and to wear mask whenever going outside of the home.  Advised patient that allergies are a chronic issue and often are a lifelong problem that we have to battle with different allergy medications to keep under control.  Advised to not take Sudafed every day as she had been doing, advised to reserve Sudafed only for times only when she is very very congested. Advised patient that I feel she does not need antibiotics.   Sore throat -- Covid 19 test negative, resulted on 06/04/2019; test performed 06/07/2019. Was also tested for Covid 19 in June 2020, negative then also. Feel sore throat related to allergies.    I discussed the assessment and treatment plan with the patient. The patient was provided an opportunity to ask questions and all were answered. The patient agreed with the plan and demonstrated an understanding of the instructions.   The patient was advised to call back or seek an in-person evaluation if the symptoms worsen or if the condition fails to improve as  anticipated.   Jodelle Green, FNP

## 2019-06-08 DIAGNOSIS — M5413 Radiculopathy, cervicothoracic region: Secondary | ICD-10-CM | POA: Diagnosis not present

## 2019-06-08 DIAGNOSIS — M7918 Myalgia, other site: Secondary | ICD-10-CM | POA: Diagnosis not present

## 2019-06-08 DIAGNOSIS — M9900 Segmental and somatic dysfunction of head region: Secondary | ICD-10-CM | POA: Diagnosis not present

## 2019-06-08 DIAGNOSIS — M542 Cervicalgia: Secondary | ICD-10-CM | POA: Diagnosis not present

## 2019-06-08 DIAGNOSIS — M9901 Segmental and somatic dysfunction of cervical region: Secondary | ICD-10-CM | POA: Diagnosis not present

## 2019-06-08 DIAGNOSIS — M5412 Radiculopathy, cervical region: Secondary | ICD-10-CM | POA: Diagnosis not present

## 2019-06-08 DIAGNOSIS — R51 Headache: Secondary | ICD-10-CM | POA: Diagnosis not present

## 2019-06-15 DIAGNOSIS — R51 Headache: Secondary | ICD-10-CM | POA: Diagnosis not present

## 2019-06-15 DIAGNOSIS — M542 Cervicalgia: Secondary | ICD-10-CM | POA: Diagnosis not present

## 2019-06-15 DIAGNOSIS — M9900 Segmental and somatic dysfunction of head region: Secondary | ICD-10-CM | POA: Diagnosis not present

## 2019-06-15 DIAGNOSIS — M7918 Myalgia, other site: Secondary | ICD-10-CM | POA: Diagnosis not present

## 2019-06-15 DIAGNOSIS — M5412 Radiculopathy, cervical region: Secondary | ICD-10-CM | POA: Diagnosis not present

## 2019-06-15 DIAGNOSIS — M9901 Segmental and somatic dysfunction of cervical region: Secondary | ICD-10-CM | POA: Diagnosis not present

## 2019-06-15 DIAGNOSIS — M5413 Radiculopathy, cervicothoracic region: Secondary | ICD-10-CM | POA: Diagnosis not present

## 2019-06-17 ENCOUNTER — Telehealth: Payer: Self-pay | Admitting: Internal Medicine

## 2019-06-17 NOTE — Telephone Encounter (Signed)
Melissa calling in with [facility]. They faxed over a attending physician form and also requested the pt's last ov notes. It was faxed on 06/06/19, she will refax. Melissa would like a status on form/confirm receipt?   CB: [phone]  Fax: [phone]  Please include the claim number:  Claim number [id number]

## 2019-06-19 DIAGNOSIS — R350 Frequency of micturition: Secondary | ICD-10-CM | POA: Diagnosis not present

## 2019-06-21 NOTE — Telephone Encounter (Signed)
Called patients husband to make sure that they requested this. It looks like they are needing office notes and your signature. This is to try to get them some help at home through their insurance

## 2019-06-21 NOTE — Telephone Encounter (Signed)
It is in the forms folder.

## 2019-06-21 NOTE — Telephone Encounter (Signed)
Is the form in the folder - holding for their appt.  I do not see where I have form.

## 2019-06-22 DIAGNOSIS — R42 Dizziness and giddiness: Secondary | ICD-10-CM | POA: Diagnosis not present

## 2019-06-22 DIAGNOSIS — H6123 Impacted cerumen, bilateral: Secondary | ICD-10-CM | POA: Diagnosis not present

## 2019-06-22 NOTE — Telephone Encounter (Signed)
Form completed and placed in box.  

## 2019-06-23 NOTE — Telephone Encounter (Signed)
Faxed

## 2019-06-28 DIAGNOSIS — M7918 Myalgia, other site: Secondary | ICD-10-CM | POA: Diagnosis not present

## 2019-06-28 DIAGNOSIS — M5413 Radiculopathy, cervicothoracic region: Secondary | ICD-10-CM | POA: Diagnosis not present

## 2019-06-28 DIAGNOSIS — M542 Cervicalgia: Secondary | ICD-10-CM | POA: Diagnosis not present

## 2019-06-28 DIAGNOSIS — M5412 Radiculopathy, cervical region: Secondary | ICD-10-CM | POA: Diagnosis not present

## 2019-06-28 DIAGNOSIS — M9900 Segmental and somatic dysfunction of head region: Secondary | ICD-10-CM | POA: Diagnosis not present

## 2019-06-28 DIAGNOSIS — M9901 Segmental and somatic dysfunction of cervical region: Secondary | ICD-10-CM | POA: Diagnosis not present

## 2019-06-28 DIAGNOSIS — R51 Headache: Secondary | ICD-10-CM | POA: Diagnosis not present

## 2019-07-05 DIAGNOSIS — R51 Headache: Secondary | ICD-10-CM | POA: Diagnosis not present

## 2019-07-05 DIAGNOSIS — M5413 Radiculopathy, cervicothoracic region: Secondary | ICD-10-CM | POA: Diagnosis not present

## 2019-07-05 DIAGNOSIS — M5412 Radiculopathy, cervical region: Secondary | ICD-10-CM | POA: Diagnosis not present

## 2019-07-05 DIAGNOSIS — M7918 Myalgia, other site: Secondary | ICD-10-CM | POA: Diagnosis not present

## 2019-07-05 DIAGNOSIS — M9901 Segmental and somatic dysfunction of cervical region: Secondary | ICD-10-CM | POA: Diagnosis not present

## 2019-07-05 DIAGNOSIS — M542 Cervicalgia: Secondary | ICD-10-CM | POA: Diagnosis not present

## 2019-07-05 DIAGNOSIS — M9900 Segmental and somatic dysfunction of head region: Secondary | ICD-10-CM | POA: Diagnosis not present

## 2019-07-07 ENCOUNTER — Ambulatory Visit: Payer: Medicare Other | Admitting: Dietician

## 2019-07-07 DIAGNOSIS — M7918 Myalgia, other site: Secondary | ICD-10-CM | POA: Diagnosis not present

## 2019-07-07 DIAGNOSIS — M9901 Segmental and somatic dysfunction of cervical region: Secondary | ICD-10-CM | POA: Diagnosis not present

## 2019-07-07 DIAGNOSIS — M9900 Segmental and somatic dysfunction of head region: Secondary | ICD-10-CM | POA: Diagnosis not present

## 2019-07-07 DIAGNOSIS — M5413 Radiculopathy, cervicothoracic region: Secondary | ICD-10-CM | POA: Diagnosis not present

## 2019-07-07 DIAGNOSIS — M5412 Radiculopathy, cervical region: Secondary | ICD-10-CM | POA: Diagnosis not present

## 2019-07-07 DIAGNOSIS — R51 Headache: Secondary | ICD-10-CM | POA: Diagnosis not present

## 2019-07-07 DIAGNOSIS — M542 Cervicalgia: Secondary | ICD-10-CM | POA: Diagnosis not present

## 2019-07-08 ENCOUNTER — Other Ambulatory Visit: Payer: Self-pay

## 2019-07-08 ENCOUNTER — Other Ambulatory Visit (INDEPENDENT_AMBULATORY_CARE_PROVIDER_SITE_OTHER): Payer: Medicare Other

## 2019-07-08 DIAGNOSIS — Z23 Encounter for immunization: Secondary | ICD-10-CM | POA: Diagnosis not present

## 2019-07-08 DIAGNOSIS — E7800 Pure hypercholesterolemia, unspecified: Secondary | ICD-10-CM

## 2019-07-08 DIAGNOSIS — I10 Essential (primary) hypertension: Secondary | ICD-10-CM

## 2019-07-08 LAB — BASIC METABOLIC PANEL
BUN: 9 mg/dL (ref 6–23)
CO2: 25 mEq/L (ref 19–32)
Calcium: 10.1 mg/dL (ref 8.4–10.5)
Chloride: 105 mEq/L (ref 96–112)
Creatinine, Ser: 0.71 mg/dL (ref 0.40–1.20)
GFR: 81.63 mL/min (ref >60.00)
Glucose, Bld: 111 mg/dL — ABNORMAL HIGH (ref 70–99)
Potassium: 4.2 mEq/L (ref 3.5–5.1)
Sodium: 141 mEq/L (ref 135–145)

## 2019-07-08 LAB — LIPID PANEL
Cholesterol: 245 mg/dL — ABNORMAL HIGH (ref 0–200)
HDL: 72.4 mg/dL (ref >39.00)
NonHDL: 172.28
Total CHOL/HDL Ratio: 3
Triglycerides: 219 mg/dL — ABNORMAL HIGH (ref 0.0–149.0)
VLDL: 43.8 mg/dL — ABNORMAL HIGH (ref 0.0–40.0)

## 2019-07-08 LAB — LDL CHOLESTEROL, DIRECT: Direct LDL: 134 mg/dL

## 2019-07-08 LAB — HEPATIC FUNCTION PANEL
ALT: 28 U/L (ref 0–35)
AST: 24 U/L (ref 0–37)
Albumin: 4.7 g/dL (ref 3.5–5.2)
Alkaline Phosphatase: 91 U/L (ref 39–117)
Bilirubin, Direct: 0.1 mg/dL (ref 0.0–0.3)
Total Bilirubin: 0.5 mg/dL (ref 0.2–1.2)
Total Protein: 6.8 g/dL (ref 6.0–8.3)

## 2019-07-12 ENCOUNTER — Other Ambulatory Visit: Payer: Self-pay

## 2019-07-12 ENCOUNTER — Ambulatory Visit (INDEPENDENT_AMBULATORY_CARE_PROVIDER_SITE_OTHER): Payer: Medicare Other | Admitting: Internal Medicine

## 2019-07-12 DIAGNOSIS — G35 Multiple sclerosis: Secondary | ICD-10-CM

## 2019-07-12 DIAGNOSIS — R7989 Other specified abnormal findings of blood chemistry: Secondary | ICD-10-CM

## 2019-07-12 DIAGNOSIS — R945 Abnormal results of liver function studies: Secondary | ICD-10-CM | POA: Diagnosis not present

## 2019-07-12 DIAGNOSIS — E7800 Pure hypercholesterolemia, unspecified: Secondary | ICD-10-CM | POA: Diagnosis not present

## 2019-07-12 DIAGNOSIS — K5900 Constipation, unspecified: Secondary | ICD-10-CM | POA: Diagnosis not present

## 2019-07-12 DIAGNOSIS — Z9109 Other allergy status, other than to drugs and biological substances: Secondary | ICD-10-CM | POA: Diagnosis not present

## 2019-07-12 DIAGNOSIS — R739 Hyperglycemia, unspecified: Secondary | ICD-10-CM

## 2019-07-12 DIAGNOSIS — F339 Major depressive disorder, recurrent, unspecified: Secondary | ICD-10-CM | POA: Diagnosis not present

## 2019-07-12 NOTE — Progress Notes (Signed)
Patient ID: Carly [name], female   DOB: November 03, 1950, 68 y.o.   MRN: [id number]   Virtual Visit via video Note  This visit type was conducted due to national recommendations for restrictions regarding the COVID-19 pandemic (e.g. social distancing).  This format is felt to be most appropriate for this patient at this time.  All issues noted in this document were discussed and addressed.  No physical exam was performed (except for noted visual exam findings with Video Visits).   I connected with Carly [name] by a video enabled telemedicine application or telephone and verified that I am speaking with the correct person using two identifiers. Location patient: home Location provider: work  Persons participating in the virtual visit: patient, provider and pts husband Data processing manager).    I discussed the limitations, risks, security and privacy concerns of performing an evaluation and management service by video and the availability of in person appointments.  The patient expressed understanding and agreed to proceed.   Reason for visit: scheduled follow up.   HPI: Saw Lauren Guse 06/07/19.  Diagnosed with allergies.  Placed on allegra.  She received flu shot recently.  Noticed since the flu shot, she has not felt as well.  Flu shot was a couple of days ago.  Today starting to feel better.  She is seeing a Restaurant manager, fast food. Helped her neck and shoulders.  Overall has been feeling better.  Getting around better.  Eating.  No nausea or vomiting.  Bowels moving.  No abdominal pain.  Discussed anxiety.  Overall feels is better.  Discussed long term care.  I also discussed with her regarding the possibility of moving to [facility], etc.  Had discussed with husband.  Wants to get this with the long term care taken care of first.  Overall she feels she is doing well at home.  Followed by neurology for MS.    ROS: See pertinent positives and negatives per HPI.  Past Medical History:  Diagnosis Date  . Allergy    . Anxiety   . Aspiration pneumonia ([place])   . Depression   . Frequent headaches    H/O  . GERD (gastroesophageal reflux disease)   . History of chicken pox   . History of colon polyps   . Hx of migraines   . Multiple sclerosis ([place])   . PONV (postoperative nausea and vomiting)     Past Surgical History:  Procedure Laterality Date  . BACK SURGERY    . CHOLECYSTECTOMY    . COLONOSCOPY WITH PROPOFOL N/A 05/18/2017   Procedure: COLONOSCOPY WITH PROPOFOL;  Surgeon: [name], MD;  Location: [facility] ENDOSCOPY;  Service: Endoscopy;  Laterality: N/A;  . FOOT SURGERY  2015  . GALLBLADDER SURGERY  2008  . HARDWARE REMOVAL Left 02/14/2016   Procedure: LEFT FOOT REMOVAL DEEP IMPLANT;  Surgeon: [name], MD;  Location: [place];  Service: Orthopedics;  Laterality: Left;  . HERNIA REPAIR     Inguinal Hernia Repair  . SPINE SURGERY  2014    Family History  Problem Relation Age of Onset  . Arthritis Mother   . Hypertension Mother   . Macular degeneration Mother   . Hypertension Father   . Hyperlipidemia Father   . Heart disease Maternal Grandfather   . Diabetes Maternal Grandfather   . Kidney disease Paternal Grandmother     SOCIAL HX: reviewed.    Current Outpatient Medications:  .  acetaminophen (TYLENOL) 325 MG tablet, Take 650 mg by mouth every  4 (four) hours as needed., Disp: , Rfl:  .  B Complex Vitamins (VITAMIN B COMPLEX PO), Take by mouth., Disp: , Rfl:  .  Calcium-Phosphorus-Vitamin D (CITRACAL +D3 PO), Take by mouth. 2 tablets twice a day, Disp: , Rfl:  .  Cholecalciferol (D3-1000 PO), Take by mouth., Disp: , Rfl:  .  Coenzyme Q10 (CO Q10) 100 MG CAPS, Take by mouth., Disp: , Rfl:  .  dalfampridine 10 MG TB12, Take 10 mg by mouth every 12 (twelve) hours., Disp: , Rfl:  .  DULoxetine (CYMBALTA) 60 MG capsule, Take 60 mg by mouth daily., Disp: , Rfl:  .  fexofenadine (ALLEGRA ALLERGY) 180 MG tablet, Take 1 tablet (180 mg total) by mouth daily., Disp:  30 tablet, Rfl: 2 .  fluticasone (FLONASE) 50 MCG/ACT nasal spray, Place 1 spray into both nostrils daily., Disp: 16 g, Rfl: 2 .  hydrocortisone (ANUSOL-HC) 25 MG suppository, Place 1 suppository (25 mg total) rectally 2 (two) times daily., Disp: 12 suppository, Rfl: 0 .  L-Methylfolate-Algae (DEPLIN 15 PO), Take 15 mg by mouth 1 day or 1 dose., Disp: , Rfl:  .  LORazepam (ATIVAN) 0.5 MG tablet, Take 0.5 mg by mouth at bedtime., Disp: , Rfl:  .  magnesium oxide (MAG-OX) 400 MG tablet, TAKE ONE TABLET BY MOUTH DAILY, Disp: 30 tablet, Rfl: 1 .  mirtazapine (REMERON) 30 MG tablet, Take 30 mg by mouth daily., Disp: , Rfl:  .  ondansetron (ZOFRAN ODT) 4 MG disintegrating tablet, Take 1 tablet (4 mg total) by mouth every 8 (eight) hours as needed for nausea or vomiting., Disp: 30 tablet, Rfl: 0 .  oxazepam (SERAX) 10 MG capsule, Take 10 mg by mouth at bedtime as needed for sleep or anxiety., Disp: , Rfl:  .  sodium chloride (V-R NASAL SPRAY SALINE) 0.65 % nasal spray, Place into the nose., Disp: , Rfl:  .  tiZANidine (ZANAFLEX) 4 MG tablet, Take 1 tablet by mouth 3 (three) times daily as needed., Disp: , Rfl:  .  traZODone (DESYREL) 50 MG tablet, Take 50 mg by mouth at bedtime. , Disp: , Rfl: 12 No current facility-administered medications for this visit.   Facility-Administered Medications Ordered in Other Visits:  .  0.9 %  sodium chloride infusion, , Intravenous, Continuous, Corcoran, Melissa C, MD, Last Rate: 10 mL/hr at 04/25/16 0855 .  acetaminophen (TYLENOL) tablet 650 mg, 650 mg, Oral, Once, Corcoran, Melissa C, MD  EXAM:  GENERAL: alert, oriented, appears well and in no acute distress  HEENT: atraumatic, conjunttiva clear, no obvious abnormalities on inspection of external nose and ears  NECK: normal movements of the head and neck  LUNGS: on inspection no signs of respiratory distress, breathing rate appears normal, no obvious gross SOB, gasping or wheezing  CV: no obvious cyanosis   PSYCH/NEURO: pleasant and cooperative, no obvious depression or anxiety, speech and thought processing grossly intact  ASSESSMENT AND PLAN:  Discussed the following assessment and plan:  Abnormal liver function tests Follow liver function tests.  Recent liver panel wnl.   Constipation Bowels better.  Doing well on current regimen.    Depression, recurrent ([place]) Followed by psychiatry.  Stable.    Hypercholesterolemia Low cholesterol diet and exercise.  Discussed calculated cholesterol risk.  Discussed starting a cholesterol medication.  She wants to hold starting right now.  Follow lipid panel. Low cholesterol diet and exercise.    Multiple sclerosis Followed by neurology.   Environmental allergies Has been taking allegra.  With some  congestion now.  Just recent took her flu shot.  Discussed using saline nasal spray and robitussin.  Follow.  Is feeling better today.  Call with update.   Hyperglycemia Low carb diet and exercise.  Follow met b and a1c.     I discussed the assessment and treatment plan with the patient. The patient was provided an opportunity to ask questions and all were answered. The patient agreed with the plan and demonstrated an understanding of the instructions.   The patient was advised to call back or seek an in-person evaluation if the symptoms worsen or if the condition fails to improve as anticipated.    Einar Pheasant, MD

## 2019-07-13 ENCOUNTER — Telehealth: Payer: Self-pay

## 2019-07-13 NOTE — Telephone Encounter (Signed)
Copied from [place] [id number]. Topic: Appointment Scheduling - Scheduling Inquiry for Clinic >> Jul 13, 2019  1:48 PM Rutherford Nail, [place] wrote: Reason for CRM: Patient calling and states that she had a virtual visit with Dr Nicki Reaper 07/12/2019 and told her that she had the flu shot 07/08/2019 and has not been feeling great since them. States that she has been feeling weakness and some blurred vision. States that Dr Nicki Reaper told her to call back if she is not feeling any better. Patient would like to know if she could see Dr Nicki Reaper on Friday 07/15/2019? No available appointments until 07/27/2019. Please advise.

## 2019-07-14 NOTE — Telephone Encounter (Signed)
She was doing relatively well at her visit.  Starting to feel some better after getting the flu shot.  I am ok with monitoring if things are stable.  Agree with staying hydrated, encouraging increased po intake, getting outside some, etc.  If persistent symptoms or if she feels needs virtual visit - ok.

## 2019-07-14 NOTE — Telephone Encounter (Signed)
Can you fit patient into your schedule or do you recommend Urgent Care?  Please advise.

## 2019-07-14 NOTE — Telephone Encounter (Signed)
Spoke with pt. Confirmed that she is not feeling any worse than she was the other day at her visit. She is feeling weak and just overall not feeling well. No fever, SOB, chills, etc. She is not having any new symptoms and confirmed that she is not having blurred vision at this time. Patient stated that she was mainly calling to give update and let Dr Nicki Reaper know that she isn't feeling better. Advised that she cannot come into the office and we could do virtual if she wants to. Patient stated she doesn't know if virtual is needed yet because she is about the same as she was earlier in the week. Pt stated When she called she was feeling anxious. Confirmed she is eating and drinking plenty of fluids. Advised if she starts feeling worse she will need to be re-evaluated. Pt would like Dr Bary Leriche opinion about needing another visit or continuing to monitor.

## 2019-07-14 NOTE — Telephone Encounter (Signed)
Pt is calling again about appt. She states that she is unbelievably tired and has no energy, generally feeling very poorly,  and is still waiting for a reply about an office visit. She is willing to do a virtual. Pls FU at [phone]

## 2019-07-14 NOTE — Telephone Encounter (Signed)
Patient is aware and is going to monitor.

## 2019-07-17 ENCOUNTER — Encounter: Payer: Self-pay | Admitting: Internal Medicine

## 2019-07-17 DIAGNOSIS — R739 Hyperglycemia, unspecified: Secondary | ICD-10-CM | POA: Insufficient documentation

## 2019-07-17 DIAGNOSIS — Z9109 Other allergy status, other than to drugs and biological substances: Secondary | ICD-10-CM | POA: Insufficient documentation

## 2019-07-17 NOTE — Assessment & Plan Note (Signed)
Has been taking allegra.  With some congestion now.  Just recent took her flu shot.  Discussed using saline nasal spray and robitussin.  Follow.  Is feeling better today.  Call with update.

## 2019-07-17 NOTE — Assessment & Plan Note (Signed)
Followed by neurology.   

## 2019-07-17 NOTE — Assessment & Plan Note (Signed)
Followed by psychiatry.  Stable.  

## 2019-07-17 NOTE — Assessment & Plan Note (Signed)
Bowels better.  Doing well on current regimen.

## 2019-07-17 NOTE — Assessment & Plan Note (Signed)
Follow liver function tests.  Recent liver panel wnl.

## 2019-07-17 NOTE — Assessment & Plan Note (Signed)
Low carb diet and exercise.  Follow met b and a1c.  

## 2019-07-17 NOTE — Assessment & Plan Note (Signed)
Low cholesterol diet and exercise.  Discussed calculated cholesterol risk.  Discussed starting a cholesterol medication.  She wants to hold starting right now.  Follow lipid panel. Low cholesterol diet and exercise.

## 2019-07-21 ENCOUNTER — Other Ambulatory Visit: Payer: Self-pay | Admitting: Internal Medicine

## 2019-07-25 DIAGNOSIS — M9901 Segmental and somatic dysfunction of cervical region: Secondary | ICD-10-CM | POA: Diagnosis not present

## 2019-07-25 DIAGNOSIS — R519 Headache, unspecified: Secondary | ICD-10-CM | POA: Diagnosis not present

## 2019-07-25 DIAGNOSIS — R109 Unspecified abdominal pain: Secondary | ICD-10-CM | POA: Diagnosis not present

## 2019-07-25 DIAGNOSIS — M5412 Radiculopathy, cervical region: Secondary | ICD-10-CM | POA: Diagnosis not present

## 2019-07-25 DIAGNOSIS — M542 Cervicalgia: Secondary | ICD-10-CM | POA: Diagnosis not present

## 2019-07-25 DIAGNOSIS — M9900 Segmental and somatic dysfunction of head region: Secondary | ICD-10-CM | POA: Diagnosis not present

## 2019-07-25 DIAGNOSIS — M7918 Myalgia, other site: Secondary | ICD-10-CM | POA: Diagnosis not present

## 2019-07-25 DIAGNOSIS — M5413 Radiculopathy, cervicothoracic region: Secondary | ICD-10-CM | POA: Diagnosis not present

## 2019-07-25 DIAGNOSIS — K5901 Slow transit constipation: Secondary | ICD-10-CM | POA: Diagnosis not present

## 2019-07-26 DIAGNOSIS — R109 Unspecified abdominal pain: Secondary | ICD-10-CM | POA: Diagnosis not present

## 2019-07-28 DIAGNOSIS — M542 Cervicalgia: Secondary | ICD-10-CM | POA: Diagnosis not present

## 2019-07-28 DIAGNOSIS — M5413 Radiculopathy, cervicothoracic region: Secondary | ICD-10-CM | POA: Diagnosis not present

## 2019-07-28 DIAGNOSIS — R519 Headache, unspecified: Secondary | ICD-10-CM | POA: Diagnosis not present

## 2019-07-28 DIAGNOSIS — M5412 Radiculopathy, cervical region: Secondary | ICD-10-CM | POA: Diagnosis not present

## 2019-07-28 DIAGNOSIS — M9901 Segmental and somatic dysfunction of cervical region: Secondary | ICD-10-CM | POA: Diagnosis not present

## 2019-07-28 DIAGNOSIS — M7918 Myalgia, other site: Secondary | ICD-10-CM | POA: Diagnosis not present

## 2019-07-28 DIAGNOSIS — M9900 Segmental and somatic dysfunction of head region: Secondary | ICD-10-CM | POA: Diagnosis not present

## 2019-08-03 ENCOUNTER — Ambulatory Visit: Payer: Medicare Other | Admitting: Dietician

## 2019-08-03 ENCOUNTER — Telehealth: Payer: Self-pay

## 2019-08-03 DIAGNOSIS — K5909 Other constipation: Secondary | ICD-10-CM | POA: Diagnosis not present

## 2019-08-03 DIAGNOSIS — G35 Multiple sclerosis: Secondary | ICD-10-CM

## 2019-08-03 DIAGNOSIS — K760 Fatty (change of) liver, not elsewhere classified: Secondary | ICD-10-CM | POA: Diagnosis not present

## 2019-08-03 DIAGNOSIS — K219 Gastro-esophageal reflux disease without esophagitis: Secondary | ICD-10-CM | POA: Diagnosis not present

## 2019-08-03 DIAGNOSIS — R748 Abnormal levels of other serum enzymes: Secondary | ICD-10-CM | POA: Diagnosis not present

## 2019-08-03 DIAGNOSIS — M545 Low back pain, unspecified: Secondary | ICD-10-CM

## 2019-08-03 NOTE — Telephone Encounter (Signed)
Copied from [place] [id number]. Topic: Referral - Request for Referral >> Aug 03, 2019 10:07 AM Alanda Slim E wrote: Has patient seen PCP for this complaint? Yes  *If NO, is insurance requiring patient see PCP for this issue before PCP can refer them? Referral for which specialty: physical therapy  [facility]/ Lyn Theda Sers is the PT  fax# [phone] Reason for referral: back pain /MS and scoliosis

## 2019-08-03 NOTE — Telephone Encounter (Signed)
Order placed for home health.  See message.

## 2019-08-03 NOTE — Telephone Encounter (Signed)
Pt wanting to restart PT with [facility]

## 2019-08-04 DIAGNOSIS — M5413 Radiculopathy, cervicothoracic region: Secondary | ICD-10-CM | POA: Diagnosis not present

## 2019-08-04 DIAGNOSIS — M542 Cervicalgia: Secondary | ICD-10-CM | POA: Diagnosis not present

## 2019-08-04 DIAGNOSIS — R519 Headache, unspecified: Secondary | ICD-10-CM | POA: Diagnosis not present

## 2019-08-04 DIAGNOSIS — M7918 Myalgia, other site: Secondary | ICD-10-CM | POA: Diagnosis not present

## 2019-08-04 DIAGNOSIS — M5412 Radiculopathy, cervical region: Secondary | ICD-10-CM | POA: Diagnosis not present

## 2019-08-04 DIAGNOSIS — M9900 Segmental and somatic dysfunction of head region: Secondary | ICD-10-CM | POA: Diagnosis not present

## 2019-08-04 DIAGNOSIS — M9901 Segmental and somatic dysfunction of cervical region: Secondary | ICD-10-CM | POA: Diagnosis not present

## 2019-08-08 DIAGNOSIS — G43909 Migraine, unspecified, not intractable, without status migrainosus: Secondary | ICD-10-CM | POA: Diagnosis not present

## 2019-08-08 DIAGNOSIS — Z9181 History of falling: Secondary | ICD-10-CM | POA: Diagnosis not present

## 2019-08-08 DIAGNOSIS — F419 Anxiety disorder, unspecified: Secondary | ICD-10-CM | POA: Diagnosis not present

## 2019-08-08 DIAGNOSIS — Z8701 Personal history of pneumonia (recurrent): Secondary | ICD-10-CM | POA: Diagnosis not present

## 2019-08-08 DIAGNOSIS — F339 Major depressive disorder, recurrent, unspecified: Secondary | ICD-10-CM | POA: Diagnosis not present

## 2019-08-08 DIAGNOSIS — M545 Low back pain: Secondary | ICD-10-CM | POA: Diagnosis not present

## 2019-08-08 DIAGNOSIS — E7800 Pure hypercholesterolemia, unspecified: Secondary | ICD-10-CM | POA: Diagnosis not present

## 2019-08-08 DIAGNOSIS — R739 Hyperglycemia, unspecified: Secondary | ICD-10-CM | POA: Diagnosis not present

## 2019-08-08 DIAGNOSIS — G35 Multiple sclerosis: Secondary | ICD-10-CM | POA: Diagnosis not present

## 2019-08-08 DIAGNOSIS — K219 Gastro-esophageal reflux disease without esophagitis: Secondary | ICD-10-CM | POA: Diagnosis not present

## 2019-08-08 DIAGNOSIS — M419 Scoliosis, unspecified: Secondary | ICD-10-CM | POA: Diagnosis not present

## 2019-08-08 DIAGNOSIS — J302 Other seasonal allergic rhinitis: Secondary | ICD-10-CM | POA: Diagnosis not present

## 2019-08-09 ENCOUNTER — Telehealth: Payer: Self-pay | Admitting: *Deleted

## 2019-08-09 NOTE — Telephone Encounter (Signed)
Verbals given to Carly [name].  ?

## 2019-08-09 NOTE — Telephone Encounter (Signed)
Copied from [place] [id number]. Topic: Quick Communication - Home Health Verbal Orders >> Aug 09, 2019  8:07 AM Carolyn Stare wrote: [facility] Red with [facility]   Callback Number [id number]  PT Frequency  2   x  8

## 2019-08-11 DIAGNOSIS — F419 Anxiety disorder, unspecified: Secondary | ICD-10-CM | POA: Diagnosis not present

## 2019-08-11 DIAGNOSIS — M5412 Radiculopathy, cervical region: Secondary | ICD-10-CM | POA: Diagnosis not present

## 2019-08-11 DIAGNOSIS — M7918 Myalgia, other site: Secondary | ICD-10-CM | POA: Diagnosis not present

## 2019-08-11 DIAGNOSIS — M542 Cervicalgia: Secondary | ICD-10-CM | POA: Diagnosis not present

## 2019-08-11 DIAGNOSIS — M5413 Radiculopathy, cervicothoracic region: Secondary | ICD-10-CM | POA: Diagnosis not present

## 2019-08-11 DIAGNOSIS — R519 Headache, unspecified: Secondary | ICD-10-CM | POA: Diagnosis not present

## 2019-08-11 DIAGNOSIS — M545 Low back pain: Secondary | ICD-10-CM | POA: Diagnosis not present

## 2019-08-11 DIAGNOSIS — M419 Scoliosis, unspecified: Secondary | ICD-10-CM | POA: Diagnosis not present

## 2019-08-11 DIAGNOSIS — G43909 Migraine, unspecified, not intractable, without status migrainosus: Secondary | ICD-10-CM | POA: Diagnosis not present

## 2019-08-11 DIAGNOSIS — F339 Major depressive disorder, recurrent, unspecified: Secondary | ICD-10-CM | POA: Diagnosis not present

## 2019-08-11 DIAGNOSIS — G35 Multiple sclerosis: Secondary | ICD-10-CM | POA: Diagnosis not present

## 2019-08-11 DIAGNOSIS — M9901 Segmental and somatic dysfunction of cervical region: Secondary | ICD-10-CM | POA: Diagnosis not present

## 2019-08-11 DIAGNOSIS — M9900 Segmental and somatic dysfunction of head region: Secondary | ICD-10-CM | POA: Diagnosis not present

## 2019-08-15 DIAGNOSIS — K760 Fatty (change of) liver, not elsewhere classified: Secondary | ICD-10-CM | POA: Insufficient documentation

## 2019-08-15 DIAGNOSIS — R748 Abnormal levels of other serum enzymes: Secondary | ICD-10-CM | POA: Insufficient documentation

## 2019-08-15 DIAGNOSIS — K219 Gastro-esophageal reflux disease without esophagitis: Secondary | ICD-10-CM | POA: Insufficient documentation

## 2019-08-15 DIAGNOSIS — K5909 Other constipation: Secondary | ICD-10-CM | POA: Insufficient documentation

## 2019-08-16 DIAGNOSIS — F339 Major depressive disorder, recurrent, unspecified: Secondary | ICD-10-CM | POA: Diagnosis not present

## 2019-08-16 DIAGNOSIS — M419 Scoliosis, unspecified: Secondary | ICD-10-CM | POA: Diagnosis not present

## 2019-08-16 DIAGNOSIS — G35 Multiple sclerosis: Secondary | ICD-10-CM | POA: Diagnosis not present

## 2019-08-16 DIAGNOSIS — G43909 Migraine, unspecified, not intractable, without status migrainosus: Secondary | ICD-10-CM | POA: Diagnosis not present

## 2019-08-16 DIAGNOSIS — M545 Low back pain: Secondary | ICD-10-CM | POA: Diagnosis not present

## 2019-08-16 DIAGNOSIS — F419 Anxiety disorder, unspecified: Secondary | ICD-10-CM | POA: Diagnosis not present

## 2019-08-18 DIAGNOSIS — L309 Dermatitis, unspecified: Secondary | ICD-10-CM | POA: Diagnosis not present

## 2019-08-19 DIAGNOSIS — M419 Scoliosis, unspecified: Secondary | ICD-10-CM | POA: Diagnosis not present

## 2019-08-19 DIAGNOSIS — F419 Anxiety disorder, unspecified: Secondary | ICD-10-CM | POA: Diagnosis not present

## 2019-08-19 DIAGNOSIS — G35 Multiple sclerosis: Secondary | ICD-10-CM | POA: Diagnosis not present

## 2019-08-19 DIAGNOSIS — M545 Low back pain: Secondary | ICD-10-CM | POA: Diagnosis not present

## 2019-08-19 DIAGNOSIS — G43909 Migraine, unspecified, not intractable, without status migrainosus: Secondary | ICD-10-CM | POA: Diagnosis not present

## 2019-08-19 DIAGNOSIS — F339 Major depressive disorder, recurrent, unspecified: Secondary | ICD-10-CM | POA: Diagnosis not present

## 2019-08-22 DIAGNOSIS — M9901 Segmental and somatic dysfunction of cervical region: Secondary | ICD-10-CM | POA: Diagnosis not present

## 2019-08-22 DIAGNOSIS — M542 Cervicalgia: Secondary | ICD-10-CM | POA: Diagnosis not present

## 2019-08-22 DIAGNOSIS — R519 Headache, unspecified: Secondary | ICD-10-CM | POA: Diagnosis not present

## 2019-08-22 DIAGNOSIS — M5413 Radiculopathy, cervicothoracic region: Secondary | ICD-10-CM | POA: Diagnosis not present

## 2019-08-22 DIAGNOSIS — M7918 Myalgia, other site: Secondary | ICD-10-CM | POA: Diagnosis not present

## 2019-08-22 DIAGNOSIS — M9900 Segmental and somatic dysfunction of head region: Secondary | ICD-10-CM | POA: Diagnosis not present

## 2019-08-22 DIAGNOSIS — M5412 Radiculopathy, cervical region: Secondary | ICD-10-CM | POA: Diagnosis not present

## 2019-08-23 DIAGNOSIS — M545 Low back pain: Secondary | ICD-10-CM | POA: Diagnosis not present

## 2019-08-23 DIAGNOSIS — F419 Anxiety disorder, unspecified: Secondary | ICD-10-CM | POA: Diagnosis not present

## 2019-08-23 DIAGNOSIS — G43909 Migraine, unspecified, not intractable, without status migrainosus: Secondary | ICD-10-CM | POA: Diagnosis not present

## 2019-08-23 DIAGNOSIS — F339 Major depressive disorder, recurrent, unspecified: Secondary | ICD-10-CM | POA: Diagnosis not present

## 2019-08-23 DIAGNOSIS — G35 Multiple sclerosis: Secondary | ICD-10-CM | POA: Diagnosis not present

## 2019-08-23 DIAGNOSIS — M419 Scoliosis, unspecified: Secondary | ICD-10-CM | POA: Diagnosis not present

## 2019-08-24 DIAGNOSIS — K760 Fatty (change of) liver, not elsewhere classified: Secondary | ICD-10-CM | POA: Diagnosis not present

## 2019-08-25 DIAGNOSIS — M545 Low back pain: Secondary | ICD-10-CM | POA: Diagnosis not present

## 2019-08-25 DIAGNOSIS — G43909 Migraine, unspecified, not intractable, without status migrainosus: Secondary | ICD-10-CM | POA: Diagnosis not present

## 2019-08-25 DIAGNOSIS — G35 Multiple sclerosis: Secondary | ICD-10-CM | POA: Diagnosis not present

## 2019-08-25 DIAGNOSIS — M419 Scoliosis, unspecified: Secondary | ICD-10-CM | POA: Diagnosis not present

## 2019-08-25 DIAGNOSIS — F339 Major depressive disorder, recurrent, unspecified: Secondary | ICD-10-CM | POA: Diagnosis not present

## 2019-08-25 DIAGNOSIS — F419 Anxiety disorder, unspecified: Secondary | ICD-10-CM | POA: Diagnosis not present

## 2019-08-25 DIAGNOSIS — K5909 Other constipation: Secondary | ICD-10-CM | POA: Diagnosis not present

## 2019-08-25 DIAGNOSIS — K219 Gastro-esophageal reflux disease without esophagitis: Secondary | ICD-10-CM | POA: Diagnosis not present

## 2019-08-30 DIAGNOSIS — G43909 Migraine, unspecified, not intractable, without status migrainosus: Secondary | ICD-10-CM | POA: Diagnosis not present

## 2019-08-30 DIAGNOSIS — H6123 Impacted cerumen, bilateral: Secondary | ICD-10-CM | POA: Diagnosis not present

## 2019-08-30 DIAGNOSIS — F339 Major depressive disorder, recurrent, unspecified: Secondary | ICD-10-CM | POA: Diagnosis not present

## 2019-08-30 DIAGNOSIS — K219 Gastro-esophageal reflux disease without esophagitis: Secondary | ICD-10-CM | POA: Diagnosis not present

## 2019-08-30 DIAGNOSIS — G35 Multiple sclerosis: Secondary | ICD-10-CM | POA: Diagnosis not present

## 2019-08-30 DIAGNOSIS — J301 Allergic rhinitis due to pollen: Secondary | ICD-10-CM | POA: Diagnosis not present

## 2019-08-30 DIAGNOSIS — F419 Anxiety disorder, unspecified: Secondary | ICD-10-CM | POA: Diagnosis not present

## 2019-08-30 DIAGNOSIS — M419 Scoliosis, unspecified: Secondary | ICD-10-CM | POA: Diagnosis not present

## 2019-08-30 DIAGNOSIS — M545 Low back pain: Secondary | ICD-10-CM | POA: Diagnosis not present

## 2019-09-01 DIAGNOSIS — M7918 Myalgia, other site: Secondary | ICD-10-CM | POA: Diagnosis not present

## 2019-09-01 DIAGNOSIS — G35 Multiple sclerosis: Secondary | ICD-10-CM | POA: Diagnosis not present

## 2019-09-01 DIAGNOSIS — M5412 Radiculopathy, cervical region: Secondary | ICD-10-CM | POA: Diagnosis not present

## 2019-09-01 DIAGNOSIS — G43909 Migraine, unspecified, not intractable, without status migrainosus: Secondary | ICD-10-CM | POA: Diagnosis not present

## 2019-09-01 DIAGNOSIS — M542 Cervicalgia: Secondary | ICD-10-CM | POA: Diagnosis not present

## 2019-09-01 DIAGNOSIS — M5413 Radiculopathy, cervicothoracic region: Secondary | ICD-10-CM | POA: Diagnosis not present

## 2019-09-01 DIAGNOSIS — F419 Anxiety disorder, unspecified: Secondary | ICD-10-CM | POA: Diagnosis not present

## 2019-09-01 DIAGNOSIS — F339 Major depressive disorder, recurrent, unspecified: Secondary | ICD-10-CM | POA: Diagnosis not present

## 2019-09-01 DIAGNOSIS — M9901 Segmental and somatic dysfunction of cervical region: Secondary | ICD-10-CM | POA: Diagnosis not present

## 2019-09-01 DIAGNOSIS — R519 Headache, unspecified: Secondary | ICD-10-CM | POA: Diagnosis not present

## 2019-09-01 DIAGNOSIS — M419 Scoliosis, unspecified: Secondary | ICD-10-CM | POA: Diagnosis not present

## 2019-09-01 DIAGNOSIS — M9900 Segmental and somatic dysfunction of head region: Secondary | ICD-10-CM | POA: Diagnosis not present

## 2019-09-01 DIAGNOSIS — M545 Low back pain: Secondary | ICD-10-CM | POA: Diagnosis not present

## 2019-09-06 DIAGNOSIS — M545 Low back pain: Secondary | ICD-10-CM | POA: Diagnosis not present

## 2019-09-06 DIAGNOSIS — F419 Anxiety disorder, unspecified: Secondary | ICD-10-CM | POA: Diagnosis not present

## 2019-09-06 DIAGNOSIS — F339 Major depressive disorder, recurrent, unspecified: Secondary | ICD-10-CM | POA: Diagnosis not present

## 2019-09-06 DIAGNOSIS — M419 Scoliosis, unspecified: Secondary | ICD-10-CM | POA: Diagnosis not present

## 2019-09-06 DIAGNOSIS — G35 Multiple sclerosis: Secondary | ICD-10-CM | POA: Diagnosis not present

## 2019-09-06 DIAGNOSIS — G43909 Migraine, unspecified, not intractable, without status migrainosus: Secondary | ICD-10-CM | POA: Diagnosis not present

## 2019-09-07 DIAGNOSIS — K219 Gastro-esophageal reflux disease without esophagitis: Secondary | ICD-10-CM | POA: Diagnosis not present

## 2019-09-07 DIAGNOSIS — F419 Anxiety disorder, unspecified: Secondary | ICD-10-CM | POA: Diagnosis not present

## 2019-09-07 DIAGNOSIS — R739 Hyperglycemia, unspecified: Secondary | ICD-10-CM | POA: Diagnosis not present

## 2019-09-07 DIAGNOSIS — G43909 Migraine, unspecified, not intractable, without status migrainosus: Secondary | ICD-10-CM | POA: Diagnosis not present

## 2019-09-07 DIAGNOSIS — M545 Low back pain: Secondary | ICD-10-CM | POA: Diagnosis not present

## 2019-09-07 DIAGNOSIS — F339 Major depressive disorder, recurrent, unspecified: Secondary | ICD-10-CM | POA: Diagnosis not present

## 2019-09-07 DIAGNOSIS — J302 Other seasonal allergic rhinitis: Secondary | ICD-10-CM | POA: Diagnosis not present

## 2019-09-07 DIAGNOSIS — Z9181 History of falling: Secondary | ICD-10-CM | POA: Diagnosis not present

## 2019-09-07 DIAGNOSIS — M419 Scoliosis, unspecified: Secondary | ICD-10-CM | POA: Diagnosis not present

## 2019-09-07 DIAGNOSIS — Z8701 Personal history of pneumonia (recurrent): Secondary | ICD-10-CM | POA: Diagnosis not present

## 2019-09-07 DIAGNOSIS — E7800 Pure hypercholesterolemia, unspecified: Secondary | ICD-10-CM | POA: Diagnosis not present

## 2019-09-07 DIAGNOSIS — G35 Multiple sclerosis: Secondary | ICD-10-CM | POA: Diagnosis not present

## 2019-09-09 DIAGNOSIS — F339 Major depressive disorder, recurrent, unspecified: Secondary | ICD-10-CM | POA: Diagnosis not present

## 2019-09-09 DIAGNOSIS — G35 Multiple sclerosis: Secondary | ICD-10-CM | POA: Diagnosis not present

## 2019-09-09 DIAGNOSIS — M545 Low back pain: Secondary | ICD-10-CM | POA: Diagnosis not present

## 2019-09-09 DIAGNOSIS — M419 Scoliosis, unspecified: Secondary | ICD-10-CM | POA: Diagnosis not present

## 2019-09-09 DIAGNOSIS — G43909 Migraine, unspecified, not intractable, without status migrainosus: Secondary | ICD-10-CM | POA: Diagnosis not present

## 2019-09-09 DIAGNOSIS — F419 Anxiety disorder, unspecified: Secondary | ICD-10-CM | POA: Diagnosis not present

## 2019-09-13 DIAGNOSIS — M7918 Myalgia, other site: Secondary | ICD-10-CM | POA: Diagnosis not present

## 2019-09-13 DIAGNOSIS — M9900 Segmental and somatic dysfunction of head region: Secondary | ICD-10-CM | POA: Diagnosis not present

## 2019-09-13 DIAGNOSIS — M419 Scoliosis, unspecified: Secondary | ICD-10-CM | POA: Diagnosis not present

## 2019-09-13 DIAGNOSIS — R519 Headache, unspecified: Secondary | ICD-10-CM | POA: Diagnosis not present

## 2019-09-13 DIAGNOSIS — M545 Low back pain: Secondary | ICD-10-CM | POA: Diagnosis not present

## 2019-09-13 DIAGNOSIS — M5413 Radiculopathy, cervicothoracic region: Secondary | ICD-10-CM | POA: Diagnosis not present

## 2019-09-13 DIAGNOSIS — M9901 Segmental and somatic dysfunction of cervical region: Secondary | ICD-10-CM | POA: Diagnosis not present

## 2019-09-13 DIAGNOSIS — F419 Anxiety disorder, unspecified: Secondary | ICD-10-CM | POA: Diagnosis not present

## 2019-09-13 DIAGNOSIS — M5412 Radiculopathy, cervical region: Secondary | ICD-10-CM | POA: Diagnosis not present

## 2019-09-13 DIAGNOSIS — G35 Multiple sclerosis: Secondary | ICD-10-CM | POA: Diagnosis not present

## 2019-09-13 DIAGNOSIS — F339 Major depressive disorder, recurrent, unspecified: Secondary | ICD-10-CM | POA: Diagnosis not present

## 2019-09-13 DIAGNOSIS — M542 Cervicalgia: Secondary | ICD-10-CM | POA: Diagnosis not present

## 2019-09-13 DIAGNOSIS — G43909 Migraine, unspecified, not intractable, without status migrainosus: Secondary | ICD-10-CM | POA: Diagnosis not present

## 2019-09-16 DIAGNOSIS — M419 Scoliosis, unspecified: Secondary | ICD-10-CM | POA: Diagnosis not present

## 2019-09-16 DIAGNOSIS — G43909 Migraine, unspecified, not intractable, without status migrainosus: Secondary | ICD-10-CM | POA: Diagnosis not present

## 2019-09-16 DIAGNOSIS — G35 Multiple sclerosis: Secondary | ICD-10-CM | POA: Diagnosis not present

## 2019-09-16 DIAGNOSIS — F339 Major depressive disorder, recurrent, unspecified: Secondary | ICD-10-CM | POA: Diagnosis not present

## 2019-09-16 DIAGNOSIS — F419 Anxiety disorder, unspecified: Secondary | ICD-10-CM | POA: Diagnosis not present

## 2019-09-16 DIAGNOSIS — M545 Low back pain: Secondary | ICD-10-CM | POA: Diagnosis not present

## 2019-09-19 ENCOUNTER — Encounter: Payer: Self-pay | Admitting: Dietician

## 2019-09-19 DIAGNOSIS — M545 Low back pain: Secondary | ICD-10-CM | POA: Diagnosis not present

## 2019-09-19 DIAGNOSIS — F419 Anxiety disorder, unspecified: Secondary | ICD-10-CM | POA: Diagnosis not present

## 2019-09-19 DIAGNOSIS — F339 Major depressive disorder, recurrent, unspecified: Secondary | ICD-10-CM | POA: Diagnosis not present

## 2019-09-19 DIAGNOSIS — M419 Scoliosis, unspecified: Secondary | ICD-10-CM | POA: Diagnosis not present

## 2019-09-19 DIAGNOSIS — G35 Multiple sclerosis: Secondary | ICD-10-CM | POA: Diagnosis not present

## 2019-09-19 DIAGNOSIS — G43909 Migraine, unspecified, not intractable, without status migrainosus: Secondary | ICD-10-CM | POA: Diagnosis not present

## 2019-09-19 NOTE — Progress Notes (Signed)
Have not heard back from patient to reschedule her second cancelled appointment on 07/29/19. Sent notification to referring provider.

## 2019-09-22 DIAGNOSIS — R519 Headache, unspecified: Secondary | ICD-10-CM | POA: Diagnosis not present

## 2019-09-22 DIAGNOSIS — M7918 Myalgia, other site: Secondary | ICD-10-CM | POA: Diagnosis not present

## 2019-09-22 DIAGNOSIS — M9901 Segmental and somatic dysfunction of cervical region: Secondary | ICD-10-CM | POA: Diagnosis not present

## 2019-09-22 DIAGNOSIS — M5413 Radiculopathy, cervicothoracic region: Secondary | ICD-10-CM | POA: Diagnosis not present

## 2019-09-22 DIAGNOSIS — M542 Cervicalgia: Secondary | ICD-10-CM | POA: Diagnosis not present

## 2019-09-22 DIAGNOSIS — M5412 Radiculopathy, cervical region: Secondary | ICD-10-CM | POA: Diagnosis not present

## 2019-09-22 DIAGNOSIS — M9900 Segmental and somatic dysfunction of head region: Secondary | ICD-10-CM | POA: Diagnosis not present

## 2019-09-23 DIAGNOSIS — G35 Multiple sclerosis: Secondary | ICD-10-CM | POA: Diagnosis not present

## 2019-09-23 DIAGNOSIS — F419 Anxiety disorder, unspecified: Secondary | ICD-10-CM | POA: Diagnosis not present

## 2019-09-23 DIAGNOSIS — M545 Low back pain: Secondary | ICD-10-CM | POA: Diagnosis not present

## 2019-09-23 DIAGNOSIS — G43909 Migraine, unspecified, not intractable, without status migrainosus: Secondary | ICD-10-CM | POA: Diagnosis not present

## 2019-09-23 DIAGNOSIS — M419 Scoliosis, unspecified: Secondary | ICD-10-CM | POA: Diagnosis not present

## 2019-09-23 DIAGNOSIS — F339 Major depressive disorder, recurrent, unspecified: Secondary | ICD-10-CM | POA: Diagnosis not present

## 2019-09-26 DIAGNOSIS — F419 Anxiety disorder, unspecified: Secondary | ICD-10-CM | POA: Diagnosis not present

## 2019-09-26 DIAGNOSIS — G35 Multiple sclerosis: Secondary | ICD-10-CM | POA: Diagnosis not present

## 2019-09-26 DIAGNOSIS — M545 Low back pain: Secondary | ICD-10-CM | POA: Diagnosis not present

## 2019-09-26 DIAGNOSIS — M419 Scoliosis, unspecified: Secondary | ICD-10-CM | POA: Diagnosis not present

## 2019-09-26 DIAGNOSIS — G43909 Migraine, unspecified, not intractable, without status migrainosus: Secondary | ICD-10-CM | POA: Diagnosis not present

## 2019-09-26 DIAGNOSIS — F339 Major depressive disorder, recurrent, unspecified: Secondary | ICD-10-CM | POA: Diagnosis not present

## 2019-09-29 DIAGNOSIS — M419 Scoliosis, unspecified: Secondary | ICD-10-CM | POA: Diagnosis not present

## 2019-09-29 DIAGNOSIS — G43909 Migraine, unspecified, not intractable, without status migrainosus: Secondary | ICD-10-CM | POA: Diagnosis not present

## 2019-09-29 DIAGNOSIS — F419 Anxiety disorder, unspecified: Secondary | ICD-10-CM | POA: Diagnosis not present

## 2019-09-29 DIAGNOSIS — G35 Multiple sclerosis: Secondary | ICD-10-CM | POA: Diagnosis not present

## 2019-09-29 DIAGNOSIS — F339 Major depressive disorder, recurrent, unspecified: Secondary | ICD-10-CM | POA: Diagnosis not present

## 2019-09-29 DIAGNOSIS — M545 Low back pain: Secondary | ICD-10-CM | POA: Diagnosis not present

## 2019-10-20 DIAGNOSIS — M5412 Radiculopathy, cervical region: Secondary | ICD-10-CM | POA: Diagnosis not present

## 2019-10-20 DIAGNOSIS — R519 Headache, unspecified: Secondary | ICD-10-CM | POA: Diagnosis not present

## 2019-10-20 DIAGNOSIS — M7918 Myalgia, other site: Secondary | ICD-10-CM | POA: Diagnosis not present

## 2019-10-20 DIAGNOSIS — M542 Cervicalgia: Secondary | ICD-10-CM | POA: Diagnosis not present

## 2019-10-20 DIAGNOSIS — M5413 Radiculopathy, cervicothoracic region: Secondary | ICD-10-CM | POA: Diagnosis not present

## 2019-10-20 DIAGNOSIS — M9900 Segmental and somatic dysfunction of head region: Secondary | ICD-10-CM | POA: Diagnosis not present

## 2019-10-20 DIAGNOSIS — M9901 Segmental and somatic dysfunction of cervical region: Secondary | ICD-10-CM | POA: Diagnosis not present

## 2019-11-01 ENCOUNTER — Telehealth: Payer: Self-pay | Admitting: *Deleted

## 2019-11-01 NOTE — Telephone Encounter (Signed)
Pt and husband could not get registered on waitlist. I registered them this morning but now appts are being taken fast so I called them back to schedule for him and Kerah  at same time due to her handicap. appt 2/10 5:15, verbalized understanding.

## 2019-11-08 DIAGNOSIS — H6123 Impacted cerumen, bilateral: Secondary | ICD-10-CM | POA: Diagnosis not present

## 2019-11-11 DIAGNOSIS — Z23 Encounter for immunization: Secondary | ICD-10-CM | POA: Diagnosis not present

## 2019-11-12 ENCOUNTER — Ambulatory Visit: Payer: Medicare Other

## 2019-11-17 ENCOUNTER — Ambulatory Visit: Payer: Medicare Other

## 2019-11-23 ENCOUNTER — Ambulatory Visit: Payer: Medicare Other

## 2019-11-24 DIAGNOSIS — K649 Unspecified hemorrhoids: Secondary | ICD-10-CM | POA: Diagnosis not present

## 2019-11-24 DIAGNOSIS — K5909 Other constipation: Secondary | ICD-10-CM | POA: Diagnosis not present

## 2019-11-24 DIAGNOSIS — K648 Other hemorrhoids: Secondary | ICD-10-CM | POA: Diagnosis not present

## 2019-11-24 DIAGNOSIS — K760 Fatty (change of) liver, not elsewhere classified: Secondary | ICD-10-CM | POA: Diagnosis not present

## 2019-11-24 DIAGNOSIS — Z79899 Other long term (current) drug therapy: Secondary | ICD-10-CM | POA: Diagnosis not present

## 2019-11-24 DIAGNOSIS — G35 Multiple sclerosis: Secondary | ICD-10-CM | POA: Diagnosis not present

## 2019-11-24 DIAGNOSIS — K219 Gastro-esophageal reflux disease without esophagitis: Secondary | ICD-10-CM | POA: Diagnosis not present

## 2019-11-28 ENCOUNTER — Other Ambulatory Visit: Payer: Medicare Other

## 2019-11-30 ENCOUNTER — Encounter: Payer: Medicare Other | Admitting: Internal Medicine

## 2019-12-02 DIAGNOSIS — Z23 Encounter for immunization: Secondary | ICD-10-CM | POA: Diagnosis not present

## 2019-12-05 ENCOUNTER — Other Ambulatory Visit (INDEPENDENT_AMBULATORY_CARE_PROVIDER_SITE_OTHER): Payer: Medicare Other

## 2019-12-05 ENCOUNTER — Other Ambulatory Visit: Payer: Self-pay

## 2019-12-05 DIAGNOSIS — R739 Hyperglycemia, unspecified: Secondary | ICD-10-CM

## 2019-12-05 DIAGNOSIS — E7800 Pure hypercholesterolemia, unspecified: Secondary | ICD-10-CM | POA: Diagnosis not present

## 2019-12-05 LAB — HEPATIC FUNCTION PANEL
ALT: 29 U/L (ref 0–35)
AST: 26 U/L (ref 0–37)
Albumin: 4.7 g/dL (ref 3.5–5.2)
Alkaline Phosphatase: 113 U/L (ref 39–117)
Bilirubin, Direct: 0 mg/dL (ref 0.0–0.3)
Total Bilirubin: 0.4 mg/dL (ref 0.2–1.2)
Total Protein: 7.2 g/dL (ref 6.0–8.3)

## 2019-12-05 LAB — CBC WITH DIFFERENTIAL/PLATELET
Basophils Absolute: 0.1 10*3/uL (ref 0.0–0.1)
Basophils Relative: 1 % (ref 0.0–3.0)
Eosinophils Absolute: 0.2 10*3/uL (ref 0.0–0.7)
Eosinophils Relative: 3 % (ref 0.0–5.0)
HCT: 42.9 % (ref 36.0–46.0)
Hemoglobin: 14 g/dL (ref 12.0–15.0)
Lymphocytes Relative: 27.2 % (ref 12.0–46.0)
Lymphs Abs: 1.9 10*3/uL (ref 0.7–4.0)
MCHC: 32.7 g/dL (ref 30.0–36.0)
MCV: 84.3 fl (ref 78.0–100.0)
Monocytes Absolute: 0.5 10*3/uL (ref 0.1–1.0)
Monocytes Relative: 7.1 % (ref 3.0–12.0)
Neutro Abs: 4.2 10*3/uL (ref 1.4–7.7)
Neutrophils Relative %: 61.7 % (ref 43.0–77.0)
Platelets: 299 10*3/uL (ref 150.0–400.0)
RBC: 5.08 Mil/uL (ref 3.87–5.11)
RDW: 15.5 % (ref 11.5–15.5)
WBC: 6.8 10*3/uL (ref 4.0–10.5)

## 2019-12-05 LAB — BASIC METABOLIC PANEL
BUN: 9 mg/dL (ref 6–23)
CO2: 25 mEq/L (ref 19–32)
Calcium: 10 mg/dL (ref 8.4–10.5)
Chloride: 105 mEq/L (ref 96–112)
Creatinine, Ser: 0.72 mg/dL (ref 0.40–1.20)
GFR: 80.22 mL/min (ref >60.00)
Glucose, Bld: 118 mg/dL — ABNORMAL HIGH (ref 70–99)
Potassium: 3.9 mEq/L (ref 3.5–5.1)
Sodium: 140 mEq/L (ref 135–145)

## 2019-12-05 LAB — LIPID PANEL
Cholesterol: 253 mg/dL — ABNORMAL HIGH (ref 0–200)
HDL: 67.8 mg/dL (ref >39.00)
NonHDL: 185.21
Total CHOL/HDL Ratio: 4
Triglycerides: 204 mg/dL — ABNORMAL HIGH (ref 0.0–149.0)
VLDL: 40.8 mg/dL — ABNORMAL HIGH (ref 0.0–40.0)

## 2019-12-05 LAB — LDL CHOLESTEROL, DIRECT: Direct LDL: 140 mg/dL

## 2019-12-05 LAB — HEMOGLOBIN A1C: Hgb A1c MFr Bld: 5.9 % (ref 4.6–6.5)

## 2019-12-09 ENCOUNTER — Ambulatory Visit (INDEPENDENT_AMBULATORY_CARE_PROVIDER_SITE_OTHER): Payer: Medicare Other | Admitting: Internal Medicine

## 2019-12-09 ENCOUNTER — Encounter: Payer: Self-pay | Admitting: Internal Medicine

## 2019-12-09 ENCOUNTER — Other Ambulatory Visit: Payer: Self-pay

## 2019-12-09 DIAGNOSIS — R739 Hyperglycemia, unspecified: Secondary | ICD-10-CM | POA: Diagnosis not present

## 2019-12-09 DIAGNOSIS — K5900 Constipation, unspecified: Secondary | ICD-10-CM

## 2019-12-09 DIAGNOSIS — F339 Major depressive disorder, recurrent, unspecified: Secondary | ICD-10-CM | POA: Diagnosis not present

## 2019-12-09 DIAGNOSIS — R945 Abnormal results of liver function studies: Secondary | ICD-10-CM | POA: Diagnosis not present

## 2019-12-09 DIAGNOSIS — E7800 Pure hypercholesterolemia, unspecified: Secondary | ICD-10-CM | POA: Diagnosis not present

## 2019-12-09 DIAGNOSIS — R7989 Other specified abnormal findings of blood chemistry: Secondary | ICD-10-CM

## 2019-12-09 DIAGNOSIS — Z9109 Other allergy status, other than to drugs and biological substances: Secondary | ICD-10-CM | POA: Diagnosis not present

## 2019-12-09 DIAGNOSIS — G35 Multiple sclerosis: Secondary | ICD-10-CM | POA: Diagnosis not present

## 2019-12-09 NOTE — Progress Notes (Signed)
Patient ID: Carly [name], female   DOB: November 06, 1950, 69 y.o.   MRN: [id number]   Virtual Visit via video Note  This visit type was conducted due to national recommendations for restrictions regarding the COVID-19 pandemic (e.g. social distancing).  This format is felt to be most appropriate for this patient at this time.  All issues noted in this document were discussed and addressed.  No physical exam was performed (except for noted visual exam findings with Video Visits).   I connected with Carly [name] by a video enabled telemedicine application or telephone and verified that I am speaking with the correct person using two identifiers. Location patient: home Location provider: work  Persons participating in the virtual visit: patient, provider and pts husband.   The limitations, risks, security and privacy concerns of performing an evaluation and management service by video and the availability of in person appointments have been discussed. The patient expressed understanding and agreed to proceed.   Reason for visit: scheduled follow up.    HPI: She is accompanied by her husband.  History obtained from both of them.  She has been seeing GI for evaluation and treatment of constipation.  Recommended colace and miralax - scheduled.  Has seen a chiropractor for her neck and shoulders - better.  Has turned her sunroom into a gym.  Is exercising.  She has also cut down on her carbonated drinks.  Husband reports 75% less.  Also decrease intake of french fries, bread, etc.  Feels better.  Doing better.  Feels stronger.  No chest pain reported.  Breathing stable.  Eating.  No increased abdominal pain reported.  Received covid vaccine.     ROS: See pertinent positives and negatives per HPI.  Past Medical History:  Diagnosis Date  . Allergy   . Anxiety   . Aspiration pneumonia ([place])   . Depression   . Frequent headaches    H/O  . GERD (gastroesophageal reflux disease)   . History of  chicken pox   . History of colon polyps   . Hx of migraines   . Multiple sclerosis ([place])   . PONV (postoperative nausea and vomiting)     Past Surgical History:  Procedure Laterality Date  . BACK SURGERY    . CHOLECYSTECTOMY    . COLONOSCOPY WITH PROPOFOL N/A 05/18/2017   Procedure: COLONOSCOPY WITH PROPOFOL;  Surgeon: Carly Silvas, MD;  Location: [facility] ENDOSCOPY;  Service: Endoscopy;  Laterality: N/A;  . FOOT SURGERY  2015  . GALLBLADDER SURGERY  2008  . HARDWARE REMOVAL Left 02/14/2016   Procedure: LEFT FOOT REMOVAL DEEP IMPLANT;  Surgeon: Carly Simmer, MD;  Location: [facility];  Service: Orthopedics;  Laterality: Left;  . HERNIA REPAIR     Inguinal Hernia Repair  . SPINE SURGERY  2014    Family History  Problem Relation Age of Onset  . Arthritis Mother   . Hypertension Mother   . Macular degeneration Mother   . Hypertension Father   . Hyperlipidemia Father   . Heart disease Maternal Grandfather   . Diabetes Maternal Grandfather   . Kidney disease Paternal Grandmother     SOCIAL HX: reviewed.    Current Outpatient Medications:  .  acetaminophen (TYLENOL) 325 MG tablet, Take 650 mg by mouth every 4 (four) hours as needed., Disp: , Rfl:  .  B Complex Vitamins (VITAMIN B COMPLEX PO), Take by mouth., Disp: , Rfl:  .  Calcium-Phosphorus-Vitamin D (CITRACAL +D3 PO), Take by mouth.  2 tablets twice a day, Disp: , Rfl:  .  Cholecalciferol (D3-1000 PO), Take by mouth., Disp: , Rfl:  .  Coenzyme Q10 (CO Q10) 100 MG CAPS, Take by mouth., Disp: , Rfl:  .  dalfampridine 10 MG TB12, Take 10 mg by mouth every 12 (twelve) hours., Disp: , Rfl:  .  DULoxetine (CYMBALTA) 60 MG capsule, Take 60 mg by mouth daily., Disp: , Rfl:  .  fexofenadine (ALLEGRA ALLERGY) 180 MG tablet, Take 1 tablet (180 mg total) by mouth daily., Disp: 30 tablet, Rfl: 2 .  fluticasone (FLONASE) 50 MCG/ACT nasal spray, Place 1 spray into both nostrils daily., Disp: 16 g, Rfl: 2 .  hydrocortisone  (ANUSOL-HC) 25 MG suppository, Place 1 suppository (25 mg total) rectally 2 (two) times daily., Disp: 12 suppository, Rfl: 0 .  L-Methylfolate-Algae (DEPLIN 15 PO), Take 15 mg by mouth 1 day or 1 dose., Disp: , Rfl:  .  LORazepam (ATIVAN) 0.5 MG tablet, Take 0.5 mg by mouth at bedtime., Disp: , Rfl:  .  magnesium oxide (MAG-OX) 400 MG tablet, TAKE ONE TABLET EVERY DAY, Disp: 30 tablet, Rfl: 5 .  mirtazapine (REMERON) 30 MG tablet, Take 30 mg by mouth daily., Disp: , Rfl:  .  ondansetron (ZOFRAN ODT) 4 MG disintegrating tablet, Take 1 tablet (4 mg total) by mouth every 8 (eight) hours as needed for nausea or vomiting., Disp: 30 tablet, Rfl: 0 .  oxazepam (SERAX) 10 MG capsule, Take 10 mg by mouth at bedtime as needed for sleep or anxiety., Disp: , Rfl:  .  sodium chloride (V-R NASAL SPRAY SALINE) 0.65 % nasal spray, Place into the nose., Disp: , Rfl:  .  tiZANidine (ZANAFLEX) 4 MG tablet, Take 1 tablet by mouth 3 (three) times daily as needed., Disp: , Rfl:  .  traZODone (DESYREL) 50 MG tablet, Take 50 mg by mouth at bedtime. , Disp: , Rfl: 12 No current facility-administered medications for this visit.  Facility-Administered Medications Ordered in Other Visits:  .  0.9 %  sodium chloride infusion, , Intravenous, Continuous, Corcoran, Carly C, MD, Last Rate: 10 mL/hr at 04/25/16 0855, New Bag at 04/25/16 0855 .  acetaminophen (TYLENOL) tablet 650 mg, 650 mg, Oral, Once, Corcoran, Carly C, MD  EXAM:  GENERAL: alert, oriented, appears well and in no acute distress  HEENT: atraumatic, conjunttiva clear, no obvious abnormalities on inspection of external nose and ears  NECK: normal movements of the head and neck  LUNGS: on inspection no signs of respiratory distress, breathing rate appears normal, no obvious gross SOB, gasping or wheezing  CV: no obvious cyanosis  PSYCH/NEURO: pleasant and cooperative, no obvious depression or anxiety, speech and thought processing grossly  intact  ASSESSMENT AND PLAN:  Discussed the following assessment and plan:  Constipation Being followed by GI.  Recommended colace and miralax. She has modified her diet.  Appears to be doing better.  Follow.    Abnormal liver function tests Recent liver panel wnl.   Depression, recurrent [facility]) Being followed by psychiatry.  Continues on cymbalta.    Environmental allergies Stable.   Hypercholesterolemia Discussed recent labs.  Discussed calculated cholesterol risk.  She is feeling better.  Wants to hold on adding medication at this time.  Follow lipid panel.  Low cholesterol diet and exercise.    Hyperglycemia Low carb diet and exercise.  Follow met b and a1c.    Multiple sclerosis Followed by neurology.  Continues on dalfampridine and tizanidine.     Orders  Placed This Encounter  Procedures  . Hemoglobin A1c    Standing Status:   Future    Standing Expiration Date:   12/17/2020  . Hepatic function panel    Standing Status:   Future    Standing Expiration Date:   12/17/2020  . Lipid panel    Standing Status:   Future    Standing Expiration Date:   12/17/2020  . TSH    Standing Status:   Future    Standing Expiration Date:   12/17/2020  . Basic metabolic panel    Standing Status:   Future    Standing Expiration Date:   12/17/2020     I discussed the assessment and treatment plan with the patient. The patient was provided an opportunity to ask questions and all were answered. The patient agreed with the plan and demonstrated an understanding of the instructions.   The patient was advised to call back or seek an in-person evaluation if the symptoms worsen or if the condition fails to improve as anticipated.    Einar Pheasant, MD

## 2019-12-18 NOTE — Assessment & Plan Note (Signed)
Followed by neurology.  Continues on dalfampridine and tizanidine.

## 2019-12-18 NOTE — Assessment & Plan Note (Signed)
Low carb diet and exercise.  Follow met b and a1c.   

## 2019-12-18 NOTE — Assessment & Plan Note (Signed)
Being followed by GI.  Recommended colace and miralax. She has modified her diet.  Appears to be doing better.  Follow.

## 2019-12-18 NOTE — Assessment & Plan Note (Signed)
Stable

## 2019-12-18 NOTE — Assessment & Plan Note (Signed)
Being followed by psychiatry.  Continues on cymbalta.

## 2019-12-18 NOTE — Assessment & Plan Note (Signed)
Discussed recent labs.  Discussed calculated cholesterol risk.  She is feeling better.  Wants to hold on adding medication at this time.  Follow lipid panel.  Low cholesterol diet and exercise.

## 2019-12-18 NOTE — Assessment & Plan Note (Signed)
Recent liver panel wnl.  

## 2019-12-19 DIAGNOSIS — M542 Cervicalgia: Secondary | ICD-10-CM | POA: Diagnosis not present

## 2019-12-19 DIAGNOSIS — M9900 Segmental and somatic dysfunction of head region: Secondary | ICD-10-CM | POA: Diagnosis not present

## 2019-12-19 DIAGNOSIS — M5412 Radiculopathy, cervical region: Secondary | ICD-10-CM | POA: Diagnosis not present

## 2019-12-19 DIAGNOSIS — R519 Headache, unspecified: Secondary | ICD-10-CM | POA: Diagnosis not present

## 2019-12-19 DIAGNOSIS — M5413 Radiculopathy, cervicothoracic region: Secondary | ICD-10-CM | POA: Diagnosis not present

## 2019-12-19 DIAGNOSIS — M7918 Myalgia, other site: Secondary | ICD-10-CM | POA: Diagnosis not present

## 2019-12-19 DIAGNOSIS — M9901 Segmental and somatic dysfunction of cervical region: Secondary | ICD-10-CM | POA: Diagnosis not present

## 2019-12-29 ENCOUNTER — Telehealth: Payer: Self-pay | Admitting: Internal Medicine

## 2019-12-29 NOTE — Telephone Encounter (Signed)
Called pt for more info. Patient stated only symptom she is having is about 2 loose, watery stools first thing in the morning for the last week. No decreased appetite. No nausea, vomiting, abdominal pain. No other acute symptoms noted. Her and her husband ate dinner together and then he had diarrhea for about 4 days. Patient was eating mashed potatoes when I spoke with her. Advised that I could schedule her for a virtual on Tuesday 3/23 but if symptoms worsen or if she develops new symptoms over the weekend she should be evaluated at acute care. Pt agreed.

## 2019-12-29 NOTE — Telephone Encounter (Signed)
Reviewed.  Agree with need for f/u appt if persistent.  Can start a probiotic daily.  Let us know if diarrhea persists or if develops any other symptoms.  Confirm no abdominal pain, fever.

## 2019-12-29 NOTE — Telephone Encounter (Signed)
Pt called and said that she has been having Diarrhea for the last 7 days  only in the morning time when she wakes up.. pt said that her husband also had this but his only lasted about 4 day  Pt is requesting a VV

## 2019-12-30 NOTE — Telephone Encounter (Signed)
Confirmed no abd pain, no fever. Patient stated she is feeling better. She is going to f/u with [place] on 3/23 virtually

## 2020-01-03 ENCOUNTER — Telehealth: Payer: Medicare Other | Admitting: Internal Medicine

## 2020-01-09 DIAGNOSIS — M542 Cervicalgia: Secondary | ICD-10-CM | POA: Diagnosis not present

## 2020-01-09 DIAGNOSIS — R519 Headache, unspecified: Secondary | ICD-10-CM | POA: Diagnosis not present

## 2020-01-09 DIAGNOSIS — M9901 Segmental and somatic dysfunction of cervical region: Secondary | ICD-10-CM | POA: Diagnosis not present

## 2020-01-09 DIAGNOSIS — M9900 Segmental and somatic dysfunction of head region: Secondary | ICD-10-CM | POA: Diagnosis not present

## 2020-01-09 DIAGNOSIS — M5412 Radiculopathy, cervical region: Secondary | ICD-10-CM | POA: Diagnosis not present

## 2020-01-09 DIAGNOSIS — M5413 Radiculopathy, cervicothoracic region: Secondary | ICD-10-CM | POA: Diagnosis not present

## 2020-01-09 DIAGNOSIS — M7918 Myalgia, other site: Secondary | ICD-10-CM | POA: Diagnosis not present

## 2020-01-17 DIAGNOSIS — K648 Other hemorrhoids: Secondary | ICD-10-CM | POA: Diagnosis not present

## 2020-01-18 DIAGNOSIS — H25813 Combined forms of age-related cataract, bilateral: Secondary | ICD-10-CM | POA: Diagnosis not present

## 2020-01-23 DIAGNOSIS — M542 Cervicalgia: Secondary | ICD-10-CM | POA: Diagnosis not present

## 2020-01-23 DIAGNOSIS — M5413 Radiculopathy, cervicothoracic region: Secondary | ICD-10-CM | POA: Diagnosis not present

## 2020-01-23 DIAGNOSIS — R519 Headache, unspecified: Secondary | ICD-10-CM | POA: Diagnosis not present

## 2020-01-23 DIAGNOSIS — M9900 Segmental and somatic dysfunction of head region: Secondary | ICD-10-CM | POA: Diagnosis not present

## 2020-01-23 DIAGNOSIS — M7918 Myalgia, other site: Secondary | ICD-10-CM | POA: Diagnosis not present

## 2020-01-23 DIAGNOSIS — M5412 Radiculopathy, cervical region: Secondary | ICD-10-CM | POA: Diagnosis not present

## 2020-01-23 DIAGNOSIS — M9901 Segmental and somatic dysfunction of cervical region: Secondary | ICD-10-CM | POA: Diagnosis not present

## 2020-01-26 DIAGNOSIS — M5413 Radiculopathy, cervicothoracic region: Secondary | ICD-10-CM | POA: Diagnosis not present

## 2020-01-26 DIAGNOSIS — R519 Headache, unspecified: Secondary | ICD-10-CM | POA: Diagnosis not present

## 2020-01-26 DIAGNOSIS — K648 Other hemorrhoids: Secondary | ICD-10-CM | POA: Diagnosis not present

## 2020-01-26 DIAGNOSIS — M542 Cervicalgia: Secondary | ICD-10-CM | POA: Diagnosis not present

## 2020-01-26 DIAGNOSIS — M5412 Radiculopathy, cervical region: Secondary | ICD-10-CM | POA: Diagnosis not present

## 2020-01-26 DIAGNOSIS — M9900 Segmental and somatic dysfunction of head region: Secondary | ICD-10-CM | POA: Diagnosis not present

## 2020-01-26 DIAGNOSIS — M7918 Myalgia, other site: Secondary | ICD-10-CM | POA: Diagnosis not present

## 2020-01-26 DIAGNOSIS — M9901 Segmental and somatic dysfunction of cervical region: Secondary | ICD-10-CM | POA: Diagnosis not present

## 2020-02-07 DIAGNOSIS — R519 Headache, unspecified: Secondary | ICD-10-CM | POA: Diagnosis not present

## 2020-02-07 DIAGNOSIS — J019 Acute sinusitis, unspecified: Secondary | ICD-10-CM | POA: Diagnosis not present

## 2020-02-07 DIAGNOSIS — M26629 Arthralgia of temporomandibular joint, unspecified side: Secondary | ICD-10-CM | POA: Diagnosis not present

## 2020-02-07 DIAGNOSIS — H6123 Impacted cerumen, bilateral: Secondary | ICD-10-CM | POA: Diagnosis not present

## 2020-02-27 DIAGNOSIS — M9900 Segmental and somatic dysfunction of head region: Secondary | ICD-10-CM | POA: Diagnosis not present

## 2020-02-27 DIAGNOSIS — M542 Cervicalgia: Secondary | ICD-10-CM | POA: Diagnosis not present

## 2020-02-27 DIAGNOSIS — M7918 Myalgia, other site: Secondary | ICD-10-CM | POA: Diagnosis not present

## 2020-02-27 DIAGNOSIS — M9901 Segmental and somatic dysfunction of cervical region: Secondary | ICD-10-CM | POA: Diagnosis not present

## 2020-02-27 DIAGNOSIS — M5413 Radiculopathy, cervicothoracic region: Secondary | ICD-10-CM | POA: Diagnosis not present

## 2020-02-27 DIAGNOSIS — M5412 Radiculopathy, cervical region: Secondary | ICD-10-CM | POA: Diagnosis not present

## 2020-02-27 DIAGNOSIS — R519 Headache, unspecified: Secondary | ICD-10-CM | POA: Diagnosis not present

## 2020-03-01 ENCOUNTER — Telehealth: Payer: Self-pay | Admitting: Internal Medicine

## 2020-03-01 NOTE — Telephone Encounter (Signed)
Pt called wanted Dr.Scott to send in a order to [facility] for physical therapy said she had done it before for her.

## 2020-03-01 NOTE — Telephone Encounter (Signed)
I am ok to place order for referral.  Please confirm with [facility] - does she need office visit.  Do they need verbal order or official referral?

## 2020-03-01 NOTE — Telephone Encounter (Signed)
Patient would like to have another referral for to [facility] for PT. Pt says it helps with her mobility.

## 2020-03-05 DIAGNOSIS — M542 Cervicalgia: Secondary | ICD-10-CM | POA: Diagnosis not present

## 2020-03-05 DIAGNOSIS — R519 Headache, unspecified: Secondary | ICD-10-CM | POA: Diagnosis not present

## 2020-03-05 DIAGNOSIS — M5412 Radiculopathy, cervical region: Secondary | ICD-10-CM | POA: Diagnosis not present

## 2020-03-05 DIAGNOSIS — M5413 Radiculopathy, cervicothoracic region: Secondary | ICD-10-CM | POA: Diagnosis not present

## 2020-03-05 DIAGNOSIS — M9900 Segmental and somatic dysfunction of head region: Secondary | ICD-10-CM | POA: Diagnosis not present

## 2020-03-05 DIAGNOSIS — M9901 Segmental and somatic dysfunction of cervical region: Secondary | ICD-10-CM | POA: Diagnosis not present

## 2020-03-05 DIAGNOSIS — M7918 Myalgia, other site: Secondary | ICD-10-CM | POA: Diagnosis not present

## 2020-03-07 NOTE — Telephone Encounter (Signed)
Yes.  It sounds like we need to visit to be able to order the home health.  Ok to move appt up.  Thanks

## 2020-03-07 NOTE — Telephone Encounter (Signed)
Called [facility]. They state that since it has been since 09/2019 since they have seen the patient this will require a new referral and office visit.   They will then need the referral faxed to [phone].  The direct call line for referrals is [phone].   Patient will be seen 04/18/20 for a physical. Patient has not had a physical for over a year. Would you like to move Patient's physical appointment up? Last labs were 12/05/19 so lab appointment can be moved up also?   Please advise

## 2020-03-08 DIAGNOSIS — M9900 Segmental and somatic dysfunction of head region: Secondary | ICD-10-CM | POA: Diagnosis not present

## 2020-03-08 DIAGNOSIS — M7918 Myalgia, other site: Secondary | ICD-10-CM | POA: Diagnosis not present

## 2020-03-08 DIAGNOSIS — M5412 Radiculopathy, cervical region: Secondary | ICD-10-CM | POA: Diagnosis not present

## 2020-03-08 DIAGNOSIS — M542 Cervicalgia: Secondary | ICD-10-CM | POA: Diagnosis not present

## 2020-03-08 DIAGNOSIS — M9901 Segmental and somatic dysfunction of cervical region: Secondary | ICD-10-CM | POA: Diagnosis not present

## 2020-03-08 DIAGNOSIS — R519 Headache, unspecified: Secondary | ICD-10-CM | POA: Diagnosis not present

## 2020-03-08 DIAGNOSIS — M5413 Radiculopathy, cervicothoracic region: Secondary | ICD-10-CM | POA: Diagnosis not present

## 2020-03-12 IMAGING — MG DIGITAL DIAGNOSTIC UNILATERAL LEFT MAMMOGRAM WITH TOMO AND CAD
8 series · 8 of 24 positions shown · non-contrast
Comparison: Previous exams including recent screening mammogram
dated 05/19/2019.

CLINICAL DATA: Patient returns today to evaluate a possible LEFT
breast asymmetry questioned on recent screening mammogram.

EXAM:
DIGITAL DIAGNOSTIC UNILATERAL LEFT MAMMOGRAM WITH CAD AND TOMO

[L MLO synth-2D]
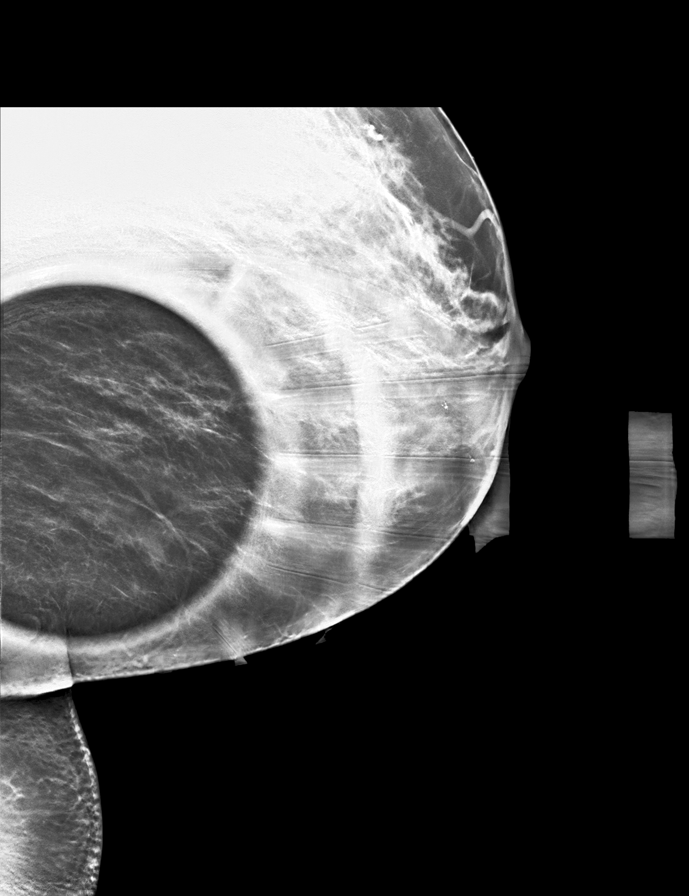

[L XCCM synth-2D]
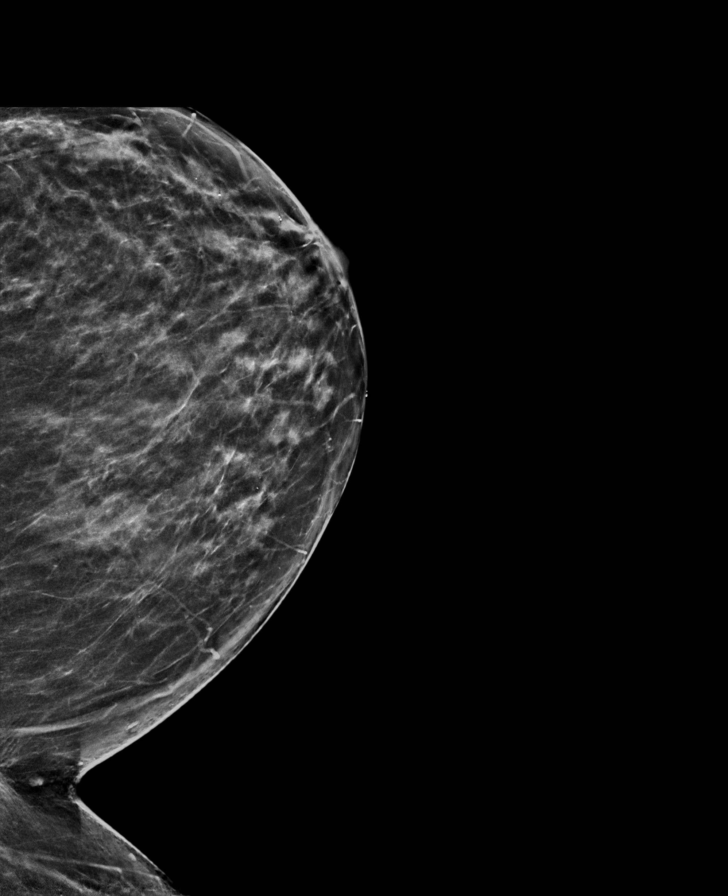

[L CC synth-2D (1 of 2)]
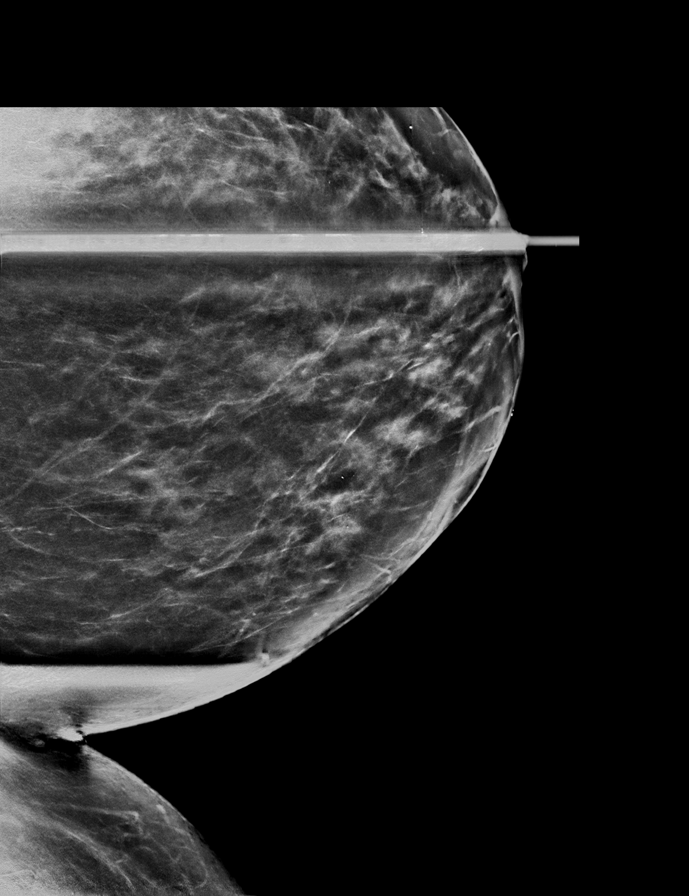

[L CC synth-2D (2 of 2)]
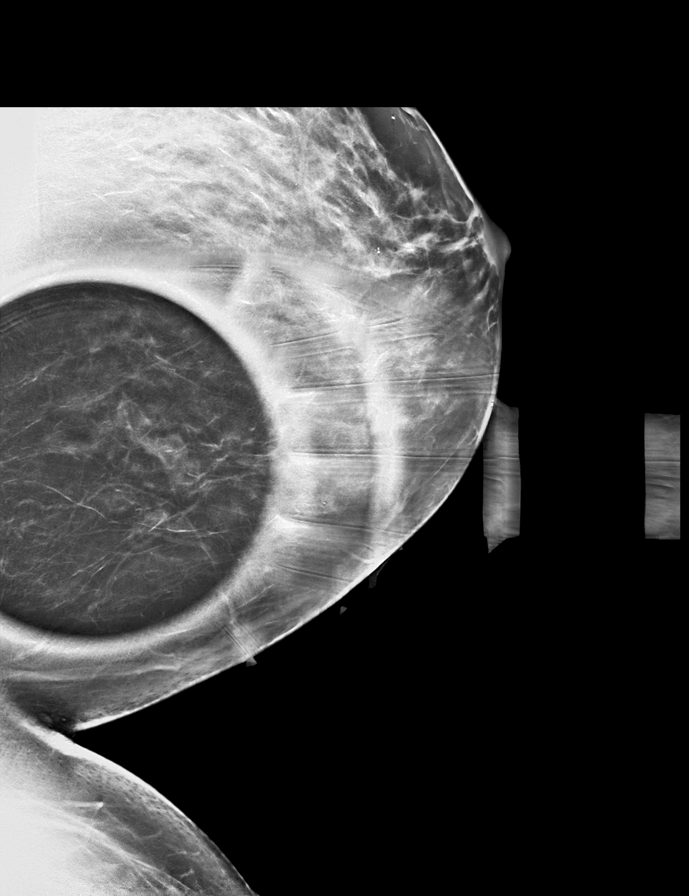

[L MLO tomo · tomo slice 28/55.0]
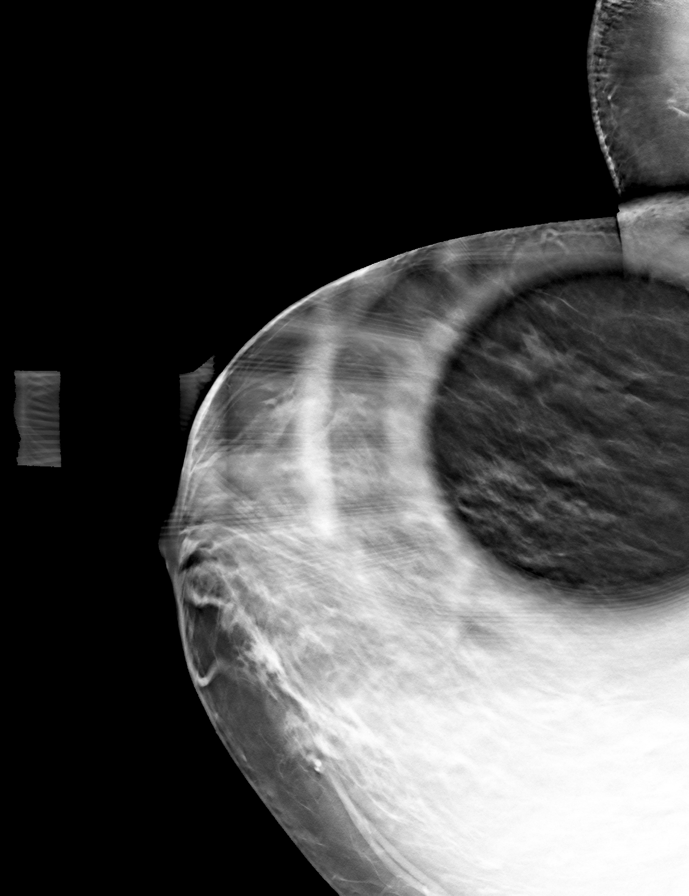

[L XCCM BREAST TOMOSYNTHESIS IMAGE tomo · tomo slice 33/65.0]
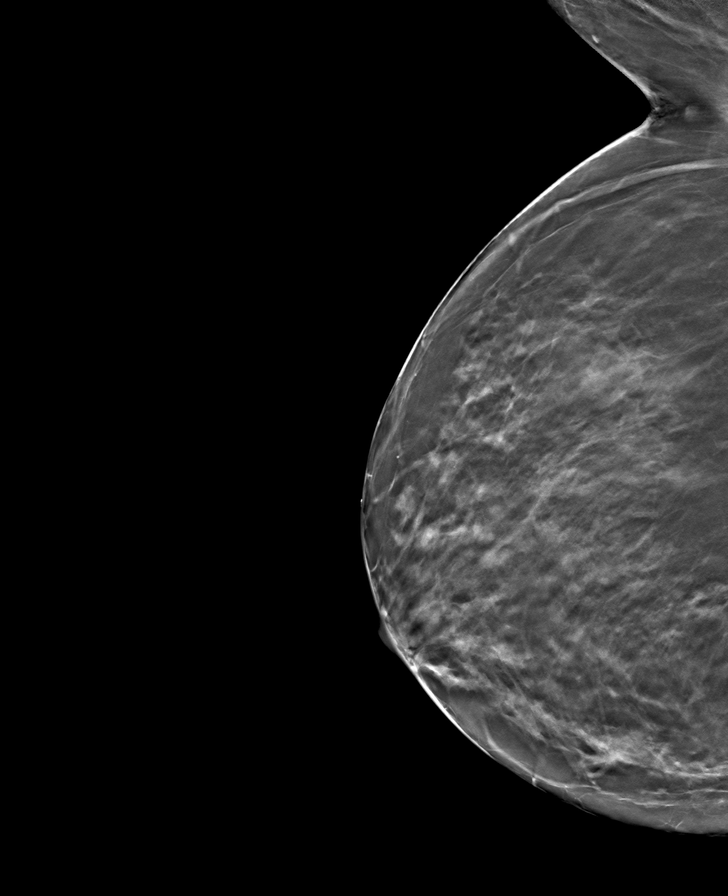

[L CC tomo (1 of 2) · tomo slice 31/62.0]
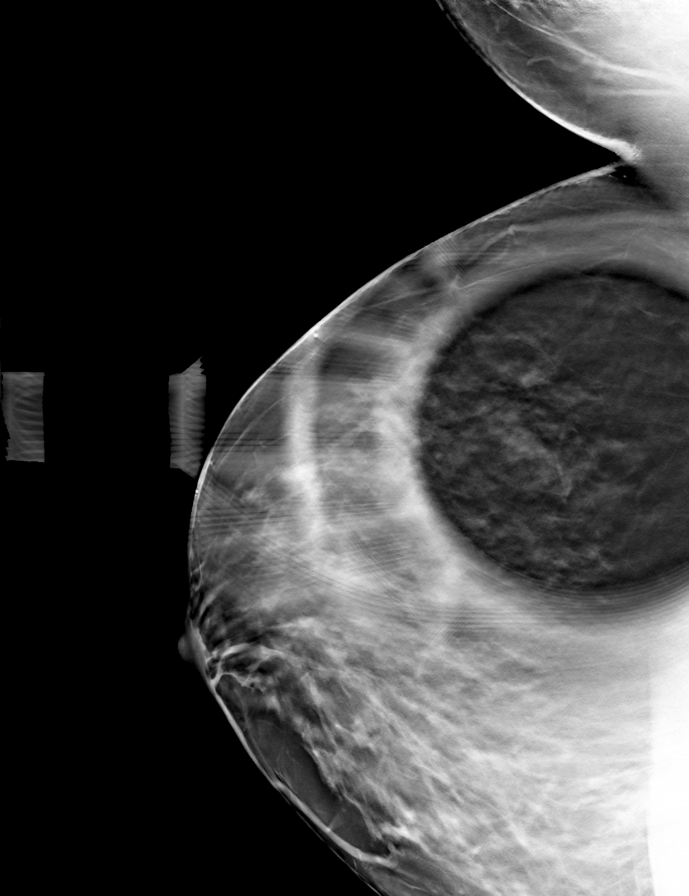

[L CC tomo (2 of 2) · tomo slice 38/75.0]
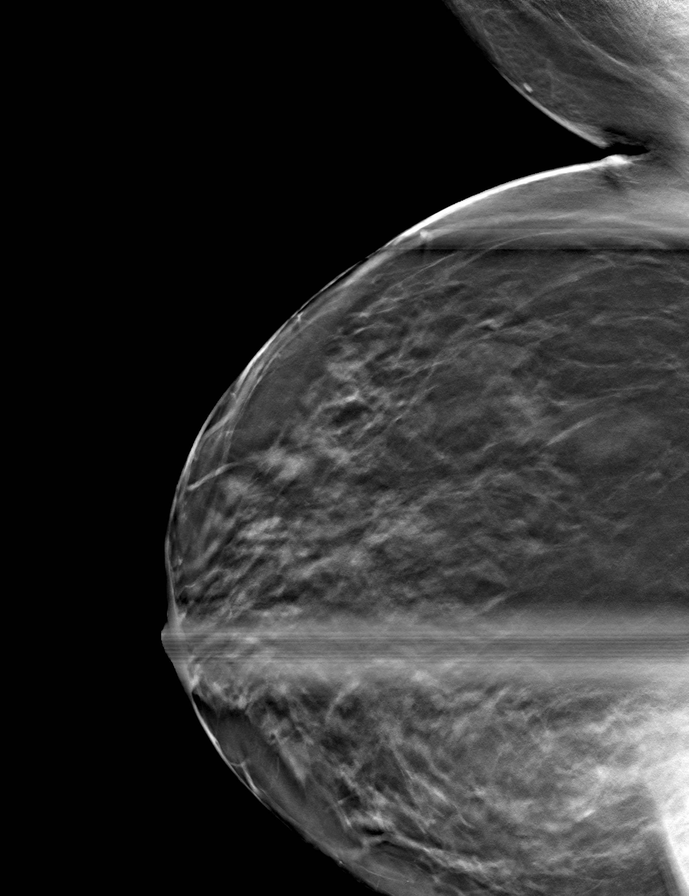

[8 of 24 positions shown; findings below may reference images not displayed]

ACR Breast Density Category c: The breast tissue is heterogeneously
dense, which may obscure small masses.
FINDINGS: On today's additional diagnostic views, including spot compression
with 3D tomosynthesis, there is no persistent asymmetry within the
LEFT breast indicating superimposition of normal fibroglandular
tissues. There are no new dominant masses, suspicious calcifications
or secondary signs of malignancy identified on today's exam.

Mammographic images were processed with CAD.
IMPRESSION: No evidence of malignancy. Patient may return to routine annual
bilateral screening mammogram schedule.

RECOMMENDATION:
Screening mammogram in one year.(Code:[id number])

I have discussed the findings and recommendations with the patient.
Results were also provided in writing at the conclusion of the
visit. If applicable, a reminder letter will be sent to the patient
regarding the next appointment.

BI-RADS CATEGORY  1: Negative.

## 2020-03-13 DIAGNOSIS — M542 Cervicalgia: Secondary | ICD-10-CM | POA: Diagnosis not present

## 2020-03-13 DIAGNOSIS — M9900 Segmental and somatic dysfunction of head region: Secondary | ICD-10-CM | POA: Diagnosis not present

## 2020-03-13 DIAGNOSIS — M5412 Radiculopathy, cervical region: Secondary | ICD-10-CM | POA: Diagnosis not present

## 2020-03-13 DIAGNOSIS — M9901 Segmental and somatic dysfunction of cervical region: Secondary | ICD-10-CM | POA: Diagnosis not present

## 2020-03-13 DIAGNOSIS — R519 Headache, unspecified: Secondary | ICD-10-CM | POA: Diagnosis not present

## 2020-03-13 DIAGNOSIS — M7918 Myalgia, other site: Secondary | ICD-10-CM | POA: Diagnosis not present

## 2020-03-13 DIAGNOSIS — M5413 Radiculopathy, cervicothoracic region: Secondary | ICD-10-CM | POA: Diagnosis not present

## 2020-03-13 NOTE — Telephone Encounter (Signed)
Patient stated that she may be having neck surgery and that she is going to postpone her PT until she gets everything else settled first

## 2020-03-19 DIAGNOSIS — M47816 Spondylosis without myelopathy or radiculopathy, lumbar region: Secondary | ICD-10-CM | POA: Diagnosis not present

## 2020-03-19 DIAGNOSIS — R29890 Loss of height: Secondary | ICD-10-CM | POA: Diagnosis not present

## 2020-03-19 DIAGNOSIS — M4185 Other forms of scoliosis, thoracolumbar region: Secondary | ICD-10-CM | POA: Diagnosis not present

## 2020-03-19 DIAGNOSIS — Z981 Arthrodesis status: Secondary | ICD-10-CM | POA: Diagnosis not present

## 2020-03-19 DIAGNOSIS — M4180 Other forms of scoliosis, site unspecified: Secondary | ICD-10-CM | POA: Diagnosis not present

## 2020-03-20 DIAGNOSIS — R519 Headache, unspecified: Secondary | ICD-10-CM | POA: Diagnosis not present

## 2020-03-20 DIAGNOSIS — M5412 Radiculopathy, cervical region: Secondary | ICD-10-CM | POA: Diagnosis not present

## 2020-03-20 DIAGNOSIS — M9901 Segmental and somatic dysfunction of cervical region: Secondary | ICD-10-CM | POA: Diagnosis not present

## 2020-03-20 DIAGNOSIS — M9900 Segmental and somatic dysfunction of head region: Secondary | ICD-10-CM | POA: Diagnosis not present

## 2020-03-20 DIAGNOSIS — M7918 Myalgia, other site: Secondary | ICD-10-CM | POA: Diagnosis not present

## 2020-03-20 DIAGNOSIS — M5413 Radiculopathy, cervicothoracic region: Secondary | ICD-10-CM | POA: Diagnosis not present

## 2020-03-20 DIAGNOSIS — M542 Cervicalgia: Secondary | ICD-10-CM | POA: Diagnosis not present

## 2020-03-22 DIAGNOSIS — M542 Cervicalgia: Secondary | ICD-10-CM | POA: Diagnosis not present

## 2020-03-22 DIAGNOSIS — M5413 Radiculopathy, cervicothoracic region: Secondary | ICD-10-CM | POA: Diagnosis not present

## 2020-03-22 DIAGNOSIS — M7918 Myalgia, other site: Secondary | ICD-10-CM | POA: Diagnosis not present

## 2020-03-22 DIAGNOSIS — M9901 Segmental and somatic dysfunction of cervical region: Secondary | ICD-10-CM | POA: Diagnosis not present

## 2020-03-22 DIAGNOSIS — M5412 Radiculopathy, cervical region: Secondary | ICD-10-CM | POA: Diagnosis not present

## 2020-03-22 DIAGNOSIS — M9900 Segmental and somatic dysfunction of head region: Secondary | ICD-10-CM | POA: Diagnosis not present

## 2020-03-22 DIAGNOSIS — R519 Headache, unspecified: Secondary | ICD-10-CM | POA: Diagnosis not present

## 2020-03-27 ENCOUNTER — Telehealth: Payer: Self-pay | Admitting: Internal Medicine

## 2020-03-27 DIAGNOSIS — M7918 Myalgia, other site: Secondary | ICD-10-CM | POA: Diagnosis not present

## 2020-03-27 DIAGNOSIS — M9900 Segmental and somatic dysfunction of head region: Secondary | ICD-10-CM | POA: Diagnosis not present

## 2020-03-27 DIAGNOSIS — M5413 Radiculopathy, cervicothoracic region: Secondary | ICD-10-CM | POA: Diagnosis not present

## 2020-03-27 DIAGNOSIS — M5412 Radiculopathy, cervical region: Secondary | ICD-10-CM | POA: Diagnosis not present

## 2020-03-27 DIAGNOSIS — M542 Cervicalgia: Secondary | ICD-10-CM | POA: Diagnosis not present

## 2020-03-27 DIAGNOSIS — R519 Headache, unspecified: Secondary | ICD-10-CM | POA: Diagnosis not present

## 2020-03-27 DIAGNOSIS — M9901 Segmental and somatic dysfunction of cervical region: Secondary | ICD-10-CM | POA: Diagnosis not present

## 2020-03-27 NOTE — Telephone Encounter (Signed)
Left message for patient to call back and schedule Medicare Annual Wellness Visit (AWV) either virtually or audio only.  Last AWV 03/10/17; please schedule at anytime with Denisa O'Brien-Blaney at [facility].

## 2020-03-29 DIAGNOSIS — M9900 Segmental and somatic dysfunction of head region: Secondary | ICD-10-CM | POA: Diagnosis not present

## 2020-03-29 DIAGNOSIS — M5413 Radiculopathy, cervicothoracic region: Secondary | ICD-10-CM | POA: Diagnosis not present

## 2020-03-29 DIAGNOSIS — M542 Cervicalgia: Secondary | ICD-10-CM | POA: Diagnosis not present

## 2020-03-29 DIAGNOSIS — R519 Headache, unspecified: Secondary | ICD-10-CM | POA: Diagnosis not present

## 2020-03-29 DIAGNOSIS — M9901 Segmental and somatic dysfunction of cervical region: Secondary | ICD-10-CM | POA: Diagnosis not present

## 2020-03-29 DIAGNOSIS — M5412 Radiculopathy, cervical region: Secondary | ICD-10-CM | POA: Diagnosis not present

## 2020-03-29 DIAGNOSIS — M7918 Myalgia, other site: Secondary | ICD-10-CM | POA: Diagnosis not present

## 2020-04-06 DIAGNOSIS — R070 Pain in throat: Secondary | ICD-10-CM | POA: Diagnosis not present

## 2020-04-06 DIAGNOSIS — K219 Gastro-esophageal reflux disease without esophagitis: Secondary | ICD-10-CM | POA: Diagnosis not present

## 2020-04-06 DIAGNOSIS — H6123 Impacted cerumen, bilateral: Secondary | ICD-10-CM | POA: Diagnosis not present

## 2020-04-11 ENCOUNTER — Other Ambulatory Visit: Payer: Medicare Other

## 2020-04-11 ENCOUNTER — Other Ambulatory Visit: Payer: Self-pay

## 2020-04-11 DIAGNOSIS — R739 Hyperglycemia, unspecified: Secondary | ICD-10-CM

## 2020-04-11 DIAGNOSIS — E7800 Pure hypercholesterolemia, unspecified: Secondary | ICD-10-CM

## 2020-04-11 NOTE — Addendum Note (Signed)
Addended by: Elpidio Galea T on: 04/11/2020 09:23 AM   Modules accepted: Orders

## 2020-04-12 ENCOUNTER — Other Ambulatory Visit: Payer: Medicare Other

## 2020-04-12 LAB — UNLABELED: Test Ordered On Req: 16802

## 2020-04-13 LAB — BASIC METABOLIC PANEL
BUN: 8 mg/dL (ref 7–25)
CO2: 22 mmol/L (ref 20–32)
Calcium: 9.8 mg/dL (ref 8.6–10.4)
Chloride: 104 mmol/L (ref 98–110)
Creat: 0.71 mg/dL (ref 0.50–0.99)
Glucose, Bld: 118 mg/dL — ABNORMAL HIGH (ref 65–99)
Potassium: 4.2 mmol/L (ref 3.5–5.3)
Sodium: 140 mmol/L (ref 135–146)

## 2020-04-13 LAB — HEPATIC FUNCTION PANEL
AG Ratio: 2.1 (calc) (ref 1.0–2.5)
ALT: 19 U/L (ref 6–29)
AST: 19 U/L (ref 10–35)
Albumin: 4.5 g/dL (ref 3.6–5.1)
Alkaline phosphatase (APISO): 96 U/L (ref 37–153)
Bilirubin, Direct: 0.1 mg/dL (ref 0.0–0.2)
Globulin: 2.1 g/dL (calc) (ref 1.9–3.7)
Indirect Bilirubin: 0.3 mg/dL (calc) (ref 0.2–1.2)
Total Bilirubin: 0.4 mg/dL (ref 0.2–1.2)
Total Protein: 6.6 g/dL (ref 6.1–8.1)

## 2020-04-13 LAB — LIPID PANEL
Cholesterol: 253 mg/dL — ABNORMAL HIGH (ref <200)
HDL: 68 mg/dL (ref >=50)
LDL Cholesterol (Calc): 154 mg/dL (calc) — ABNORMAL HIGH
Non-HDL Cholesterol (Calc): 185 mg/dL (calc) — ABNORMAL HIGH (ref <130)
Total CHOL/HDL Ratio: 3.7 (calc) (ref <5.0)
Triglycerides: 174 mg/dL — ABNORMAL HIGH (ref <150)

## 2020-04-13 LAB — PAT ID TIQ DOC: Test Affected: 16802

## 2020-04-13 LAB — TSH: TSH: 1.98 mIU/L (ref 0.40–4.50)

## 2020-04-17 DIAGNOSIS — M9900 Segmental and somatic dysfunction of head region: Secondary | ICD-10-CM | POA: Diagnosis not present

## 2020-04-17 DIAGNOSIS — M7918 Myalgia, other site: Secondary | ICD-10-CM | POA: Diagnosis not present

## 2020-04-17 DIAGNOSIS — R519 Headache, unspecified: Secondary | ICD-10-CM | POA: Diagnosis not present

## 2020-04-17 DIAGNOSIS — M5413 Radiculopathy, cervicothoracic region: Secondary | ICD-10-CM | POA: Diagnosis not present

## 2020-04-17 DIAGNOSIS — M5412 Radiculopathy, cervical region: Secondary | ICD-10-CM | POA: Diagnosis not present

## 2020-04-17 DIAGNOSIS — M9901 Segmental and somatic dysfunction of cervical region: Secondary | ICD-10-CM | POA: Diagnosis not present

## 2020-04-17 DIAGNOSIS — M542 Cervicalgia: Secondary | ICD-10-CM | POA: Diagnosis not present

## 2020-04-18 ENCOUNTER — Ambulatory Visit (INDEPENDENT_AMBULATORY_CARE_PROVIDER_SITE_OTHER): Payer: Medicare Other | Admitting: Internal Medicine

## 2020-04-18 ENCOUNTER — Other Ambulatory Visit: Payer: Self-pay

## 2020-04-18 VITALS — BP 130/78 | HR 98 | Temp 98.3°F | Resp 16 | Ht 63.0 in | Wt 174.0 lb

## 2020-04-18 DIAGNOSIS — Z0000 Encounter for general adult medical examination without abnormal findings: Secondary | ICD-10-CM

## 2020-04-18 DIAGNOSIS — F339 Major depressive disorder, recurrent, unspecified: Secondary | ICD-10-CM | POA: Diagnosis not present

## 2020-04-18 DIAGNOSIS — G35 Multiple sclerosis: Secondary | ICD-10-CM

## 2020-04-18 DIAGNOSIS — M545 Low back pain, unspecified: Secondary | ICD-10-CM

## 2020-04-18 DIAGNOSIS — K649 Unspecified hemorrhoids: Secondary | ICD-10-CM

## 2020-04-18 DIAGNOSIS — E7800 Pure hypercholesterolemia, unspecified: Secondary | ICD-10-CM | POA: Diagnosis not present

## 2020-04-18 DIAGNOSIS — R739 Hyperglycemia, unspecified: Secondary | ICD-10-CM | POA: Diagnosis not present

## 2020-04-18 DIAGNOSIS — R945 Abnormal results of liver function studies: Secondary | ICD-10-CM

## 2020-04-18 DIAGNOSIS — R7989 Other specified abnormal findings of blood chemistry: Secondary | ICD-10-CM

## 2020-04-18 LAB — POCT GLYCOSYLATED HEMOGLOBIN (HGB A1C): Hemoglobin A1C: 5.8 % — AB (ref 4.0–5.6)

## 2020-04-18 NOTE — Progress Notes (Signed)
Patient ID: Carly [name], female   DOB: 07-05-1951, 69 y.o.   MRN: [id number]   Subjective:    Patient ID: Carly [name], female    DOB: October 29, 1950, 69 y.o.   MRN: [id number]  HPI This visit occurred during the SARS-CoV-2 public health emergency.  Safety protocols were in place, including screening questions prior to the visit, additional usage of staff PPE, and extensive cleaning of exam room while observing appropriate contact time as indicated for disinfecting solutions.  Patient with past history of MS, hypercholesterolemia and back pain.  She comes in today to follow up on these issues as well as for a complete physical exam.  She is accompanied by her husband.  History obtained from both of them.  Is s/p posterior lumbar interbody fusion L2-S1 11/2012.  Did well until several months ago.  Has been having problems with increased pain.  She saw Edythe Clarity (NP - ortho).  Planning for MRI and f/u with Dr Harl Bowie - 04/25/20.  Having pain back, arm, neck and headaches.  Seeing Dr Birdie Sons - chiropractor.  Has helped.  She saw him yesterday.  Symptoms better today.  Bowels are moving better.  Saw Dr Peyton Najjar for hemorrhoids.  S/p band ligation.  Doing better.  Sees psychiatry.  Taking lorazepam and remeron.  Overall stable.  Discussed labs.  a1c 5.8.  Other than her back, overall things appear to be stable.    Past Medical History:  Diagnosis Date  . Allergy   . Anxiety   . Aspiration pneumonia ([place])   . Depression   . Frequent headaches    H/O  . GERD (gastroesophageal reflux disease)   . History of chicken pox   . History of colon polyps   . Hx of migraines   . Multiple sclerosis ([place])   . PONV (postoperative nausea and vomiting)    Past Surgical History:  Procedure Laterality Date  . BACK SURGERY    . CHOLECYSTECTOMY    . COLONOSCOPY WITH PROPOFOL N/A 05/18/2017   Procedure: COLONOSCOPY WITH PROPOFOL;  Surgeon: [name], MD;  Location: [facility] ENDOSCOPY;  Service:  Endoscopy;  Laterality: N/A;  . FOOT SURGERY  2015  . GALLBLADDER SURGERY  2008  . HARDWARE REMOVAL Left 02/14/2016   Procedure: LEFT FOOT REMOVAL DEEP IMPLANT;  Surgeon: [name], MD;  Location: [place];  Service: Orthopedics;  Laterality: Left;  . HERNIA REPAIR     Inguinal Hernia Repair  . SPINE SURGERY  2014   Family History  Problem Relation Age of Onset  . Arthritis Mother   . Hypertension Mother   . Macular degeneration Mother   . Hypertension Father   . Hyperlipidemia Father   . Heart disease Maternal Grandfather   . Diabetes Maternal Grandfather   . Kidney disease Paternal Grandmother    Social History   Socioeconomic History  . Marital status: Married    Spouse name: Not on file  . Number of children: Not on file  . Years of education: Not on file  . Highest education level: Not on file  Occupational History  . Not on file  Tobacco Use  . Smoking status: Never Smoker  . Smokeless tobacco: Never Used  Vaping Use  . Vaping Use: Never used  Substance and Sexual Activity  . Alcohol use: No    Alcohol/week: 0.0 standard drinks  . Drug use: No  . Sexual activity: Not Currently  Other Topics Concern  . Not on file  Social History Narrative   Married    Investment banker, operational of Radio broadcast assistant Strain:   . Difficulty of Paying Living Expenses:   Food Insecurity:   . Worried About Charity fundraiser in the Last Year:   . Arboriculturist in the Last Year:   Transportation Needs:   . Film/video editor (Medical):   Marland Kitchen Lack of Transportation (Non-Medical):   Physical Activity:   . Days of Exercise per Week:   . Minutes of Exercise per Session:   Stress:   . Feeling of Stress :   Social Connections:   . Frequency of Communication with Friends and Family:   . Frequency of Social Gatherings with Friends and Family:   . Attends Religious Services:   . Active Member of Clubs or Organizations:   . Attends Archivist  Meetings:   Marland Kitchen Marital Status:     Outpatient Encounter Medications as of 04/18/2020  Medication Sig  . acetaminophen (TYLENOL) 325 MG tablet Take 650 mg by mouth every 4 (four) hours as needed.  . B Complex Vitamins (VITAMIN B COMPLEX PO) Take by mouth.  . Calcium-Phosphorus-Vitamin D (CITRACAL +D3 PO) Take by mouth. 2 tablets twice a day  . Cholecalciferol (D3-1000 PO) Take by mouth.  . Coenzyme Q10 (CO Q10) 100 MG CAPS Take by mouth.  . dalfampridine 10 MG TB12 Take 10 mg by mouth every 12 (twelve) hours.  . DULoxetine (CYMBALTA) 60 MG capsule Take 60 mg by mouth daily.  . fexofenadine (ALLEGRA ALLERGY) 180 MG tablet Take 1 tablet (180 mg total) by mouth daily.  . fluticasone (FLONASE) 50 MCG/ACT nasal spray Place 1 spray into both nostrils daily.  . hydrocortisone (ANUSOL-HC) 25 MG suppository Place 1 suppository (25 mg total) rectally 2 (two) times daily.  Marland Kitchen L-Methylfolate-Algae (DEPLIN 15 PO) Take 15 mg by mouth 1 day or 1 dose.  Marland Kitchen LORazepam (ATIVAN) 0.5 MG tablet Take 0.5 mg by mouth at bedtime.  . magnesium oxide (MAG-OX) 400 MG tablet TAKE ONE TABLET EVERY DAY  . mirtazapine (REMERON) 30 MG tablet Take 30 mg by mouth daily.  . ondansetron (ZOFRAN ODT) 4 MG disintegrating tablet Take 1 tablet (4 mg total) by mouth every 8 (eight) hours as needed for nausea or vomiting.  Marland Kitchen oxazepam (SERAX) 10 MG capsule Take 10 mg by mouth at bedtime as needed for sleep or anxiety.  . sodium chloride (V-R NASAL SPRAY SALINE) 0.65 % nasal spray Place into the nose.  Marland Kitchen tiZANidine (ZANAFLEX) 4 MG tablet Take 1 tablet by mouth 3 (three) times daily as needed.  . traZODone (DESYREL) 50 MG tablet Take 50 mg by mouth at bedtime.    Facility-Administered Encounter Medications as of 04/18/2020  Medication  . 0.9 %  sodium chloride infusion  . acetaminophen (TYLENOL) tablet 650 mg    Review of Systems  Constitutional: Negative for appetite change and unexpected weight change.  HENT: Negative for congestion  and sinus pressure.   Eyes: Negative for pain and visual disturbance.  Respiratory: Negative for cough, chest tightness and shortness of breath.   Cardiovascular: Negative for chest pain, palpitations and leg swelling.  Gastrointestinal: Negative for abdominal pain, diarrhea, nausea and vomiting.  Genitourinary: Negative for difficulty urinating and dysuria.  Musculoskeletal: Positive for back pain. Negative for joint swelling.  Skin: Negative for color change and rash.  Neurological: Negative for dizziness, light-headedness and headaches.  Hematological: Negative for adenopathy. Does not bruise/bleed easily.  Psychiatric/Behavioral:  Negative for agitation and dysphoric mood.       Objective:    Physical Exam Vitals reviewed.  Constitutional:      General: She is not in acute distress.    Appearance: Normal appearance. She is well-developed.  HENT:     Head: Normocephalic and atraumatic.     Right Ear: External ear normal.     Left Ear: External ear normal.  Eyes:     General: No scleral icterus.       Right eye: No discharge.        Left eye: No discharge.     Conjunctiva/sclera: Conjunctivae normal.  Neck:     Thyroid: No thyromegaly.  Cardiovascular:     Rate and Rhythm: Normal rate and regular rhythm.  Pulmonary:     Effort: No tachypnea, accessory muscle usage or respiratory distress.     Breath sounds: Normal breath sounds. No decreased breath sounds or wheezing.  Chest:     Breasts:        Right: No inverted nipple, mass, nipple discharge or tenderness (no axillary adenopathy).        Left: No inverted nipple, mass, nipple discharge or tenderness (no axilarry adenopathy).  Abdominal:     General: Bowel sounds are normal.     Palpations: Abdomen is soft.     Tenderness: There is no abdominal tenderness.  Musculoskeletal:        General: No swelling or tenderness.     Cervical back: Neck supple. No tenderness.  Lymphadenopathy:     Cervical: No cervical  adenopathy.  Skin:    Findings: No erythema or rash.  Neurological:     Mental Status: She is alert and oriented to person, place, and time.  Psychiatric:        Mood and Affect: Mood normal.        Behavior: Behavior normal.     BP 130/78   Pulse 98   Temp 98.3 F (36.8 C)   Resp 16   Ht _0  (1.6 m)   Wt 174 lb (78.9 kg)   SpO2 97%   BMI 30.82 kg/m  Wt Readings from Last 3 Encounters:  04/18/20 174 lb (78.9 kg)  12/09/19 165 lb (74.8 kg)  07/12/19 165 lb (74.8 kg)     Lab Results  Component Value Date   WBC 6.8 12/05/2019   HGB 14.0 12/05/2019   HCT 42.9 12/05/2019   PLT 299.0 12/05/2019   GLUCOSE 118 (H) 04/11/2020   CHOL 253 (H) 04/11/2020   TRIG 174 (H) 04/11/2020   HDL 68 04/11/2020   LDLDIRECT 140.0 12/05/2019   LDLCALC 154 (H) 04/11/2020   ALT 19 04/11/2020   AST 19 04/11/2020   NA 140 04/11/2020   K 4.2 04/11/2020   CL 104 04/11/2020   CREATININE 0.71 04/11/2020   BUN 8 04/11/2020   CO2 22 04/11/2020   TSH 1.98 04/11/2020   HGBA1C 5.8 (A) 04/18/2020    MM DIAG BREAST TOMO UNI LEFT  Result Date: 05/25/2019 CLINICAL DATA:  Patient returns today to evaluate a possible LEFT breast asymmetry questioned on recent screening mammogram. EXAM: DIGITAL DIAGNOSTIC UNILATERAL LEFT MAMMOGRAM WITH CAD AND TOMO COMPARISON:  Previous exams including recent screening mammogram dated 05/19/2019. ACR Breast Density Category c: The breast tissue is heterogeneously dense, which may obscure small masses. FINDINGS: On today's additional diagnostic views, including spot compression with 3D tomosynthesis, there is no persistent asymmetry within the LEFT breast indicating superimposition of normal fibroglandular  tissues. There are no new dominant masses, suspicious calcifications or secondary signs of malignancy identified on today's exam. Mammographic images were processed with CAD. IMPRESSION: No evidence of malignancy. Patient may return to routine annual bilateral screening  mammogram schedule. RECOMMENDATION: Screening mammogram in one year.(Code:[id number]-B-01Y) I have discussed the findings and recommendations with the patient. Results were also provided in writing at the conclusion of the visit. If applicable, a reminder letter will be sent to the patient regarding the next appointment. BI-RADS CATEGORY  1: Negative. Electronically Signed   By: Franki Cabot M.D.   On: 05/25/2019 11:10       Assessment & Plan:   Problem List Items Addressed This Visit    Abnormal liver function tests    Liver function tests just checked wnl.       Back pain    Increased back pain as outlined.  Planning for MRI and f/u with Dr Harl Bowie - 04/25/20.  Chiropractor helped.        Depression, recurrent [facility])    Seeing psychiatry - [name].  Continue remeron, lorazepam and cymbalta.  Stable.       Health care maintenance    Physical today 04/18/20.  Mammogram 05/19/19 birads 0.  F/u left breast mammogram 05/24/20 - birads I.  Recommended f/u mammogram in one year.  Colonoscopy 05/2017 - multiple polyps.  Will need to contact GI when due f/u colonoscopy.        Hemorrhoid    S/p band ligation.  Saw Dr Peyton Najjar.  Follow.  Doing better.       Hypercholesterolemia    Has wanted to hold on cholesterol medication.  Low cholesterol diet and exercise.  Follow lipid panel.   Lab Results  Component Value Date   CHOL 253 (H) 04/11/2020   HDL 68 04/11/2020   LDLCALC 154 (H) 04/11/2020   LDLDIRECT 140.0 12/05/2019   TRIG 174 (H) 04/11/2020   CHOLHDL 3.7 04/11/2020        Relevant Orders   Hepatic function panel   Lipid panel   Hyperglycemia - Primary    Low carb diet and exercise.  Follow met b and a1c.  Recent check 5.8.        Relevant Orders   POCT HgB A1C (Completed)   Hemoglobin F3O   Basic metabolic panel   Multiple sclerosis ([place])    Followed by neurology.  On dalfampridine and tizanidine.  Stable.            Einar Pheasant, MD

## 2020-04-22 ENCOUNTER — Encounter: Payer: Self-pay | Admitting: Internal Medicine

## 2020-04-22 NOTE — Assessment & Plan Note (Signed)
Seeing psychiatry - [name].  Continue remeron, lorazepam and cymbalta.  Stable.

## 2020-04-22 NOTE — Assessment & Plan Note (Signed)
Low carb diet and exercise.  Follow met b and a1c.  Recent check 5.8.

## 2020-04-22 NOTE — Assessment & Plan Note (Signed)
Increased back pain as outlined.  Planning for MRI and f/u with Dr Harl Bowie - 04/25/20.  Chiropractor helped.

## 2020-04-22 NOTE — Assessment & Plan Note (Signed)
Has wanted to hold on cholesterol medication.  Low cholesterol diet and exercise.  Follow lipid panel.   Lab Results  Component Value Date   CHOL 253 (H) 04/11/2020   HDL 68 04/11/2020   LDLCALC 154 (H) 04/11/2020   LDLDIRECT 140.0 12/05/2019   TRIG 174 (H) 04/11/2020   CHOLHDL 3.7 04/11/2020

## 2020-04-22 NOTE — Assessment & Plan Note (Addendum)
Physical today 04/18/20.  Mammogram 05/19/19 birads 0.  F/u left breast mammogram 05/24/20 - birads I.  Recommended f/u mammogram in one year.  Colonoscopy 05/2017 - multiple polyps.  Will need to contact GI when due f/u colonoscopy.

## 2020-04-22 NOTE — Assessment & Plan Note (Signed)
Liver function tests just checked wnl.

## 2020-04-22 NOTE — Assessment & Plan Note (Signed)
S/p band ligation.  Saw Dr Peyton Najjar.  Follow.  Doing better.

## 2020-04-22 NOTE — Assessment & Plan Note (Signed)
Followed by neurology.  On dalfampridine and tizanidine.  Stable.

## 2020-04-23 DIAGNOSIS — M7918 Myalgia, other site: Secondary | ICD-10-CM | POA: Diagnosis not present

## 2020-04-23 DIAGNOSIS — R519 Headache, unspecified: Secondary | ICD-10-CM | POA: Diagnosis not present

## 2020-04-23 DIAGNOSIS — M5413 Radiculopathy, cervicothoracic region: Secondary | ICD-10-CM | POA: Diagnosis not present

## 2020-04-23 DIAGNOSIS — M9900 Segmental and somatic dysfunction of head region: Secondary | ICD-10-CM | POA: Diagnosis not present

## 2020-04-23 DIAGNOSIS — M5412 Radiculopathy, cervical region: Secondary | ICD-10-CM | POA: Diagnosis not present

## 2020-04-23 DIAGNOSIS — M542 Cervicalgia: Secondary | ICD-10-CM | POA: Diagnosis not present

## 2020-04-23 DIAGNOSIS — M9901 Segmental and somatic dysfunction of cervical region: Secondary | ICD-10-CM | POA: Diagnosis not present

## 2020-04-25 DIAGNOSIS — M48061 Spinal stenosis, lumbar region without neurogenic claudication: Secondary | ICD-10-CM | POA: Diagnosis not present

## 2020-04-25 DIAGNOSIS — M4185 Other forms of scoliosis, thoracolumbar region: Secondary | ICD-10-CM | POA: Diagnosis not present

## 2020-04-25 DIAGNOSIS — M47816 Spondylosis without myelopathy or radiculopathy, lumbar region: Secondary | ICD-10-CM | POA: Diagnosis not present

## 2020-04-25 DIAGNOSIS — Z981 Arthrodesis status: Secondary | ICD-10-CM | POA: Diagnosis not present

## 2020-04-25 DIAGNOSIS — G35 Multiple sclerosis: Secondary | ICD-10-CM | POA: Diagnosis not present

## 2020-04-25 DIAGNOSIS — M4186 Other forms of scoliosis, lumbar region: Secondary | ICD-10-CM | POA: Diagnosis not present

## 2020-04-26 DIAGNOSIS — M7918 Myalgia, other site: Secondary | ICD-10-CM | POA: Diagnosis not present

## 2020-04-26 DIAGNOSIS — M5412 Radiculopathy, cervical region: Secondary | ICD-10-CM | POA: Diagnosis not present

## 2020-04-26 DIAGNOSIS — R519 Headache, unspecified: Secondary | ICD-10-CM | POA: Diagnosis not present

## 2020-04-26 DIAGNOSIS — M9900 Segmental and somatic dysfunction of head region: Secondary | ICD-10-CM | POA: Diagnosis not present

## 2020-04-26 DIAGNOSIS — M9901 Segmental and somatic dysfunction of cervical region: Secondary | ICD-10-CM | POA: Diagnosis not present

## 2020-04-26 DIAGNOSIS — M5413 Radiculopathy, cervicothoracic region: Secondary | ICD-10-CM | POA: Diagnosis not present

## 2020-04-26 DIAGNOSIS — M542 Cervicalgia: Secondary | ICD-10-CM | POA: Diagnosis not present

## 2020-05-01 DIAGNOSIS — M9900 Segmental and somatic dysfunction of head region: Secondary | ICD-10-CM | POA: Diagnosis not present

## 2020-05-01 DIAGNOSIS — M7918 Myalgia, other site: Secondary | ICD-10-CM | POA: Diagnosis not present

## 2020-05-01 DIAGNOSIS — M5413 Radiculopathy, cervicothoracic region: Secondary | ICD-10-CM | POA: Diagnosis not present

## 2020-05-01 DIAGNOSIS — M542 Cervicalgia: Secondary | ICD-10-CM | POA: Diagnosis not present

## 2020-05-01 DIAGNOSIS — M5412 Radiculopathy, cervical region: Secondary | ICD-10-CM | POA: Diagnosis not present

## 2020-05-01 DIAGNOSIS — R519 Headache, unspecified: Secondary | ICD-10-CM | POA: Diagnosis not present

## 2020-05-01 DIAGNOSIS — M9901 Segmental and somatic dysfunction of cervical region: Secondary | ICD-10-CM | POA: Diagnosis not present

## 2020-05-08 DIAGNOSIS — M9900 Segmental and somatic dysfunction of head region: Secondary | ICD-10-CM | POA: Diagnosis not present

## 2020-05-08 DIAGNOSIS — M5412 Radiculopathy, cervical region: Secondary | ICD-10-CM | POA: Diagnosis not present

## 2020-05-08 DIAGNOSIS — M542 Cervicalgia: Secondary | ICD-10-CM | POA: Diagnosis not present

## 2020-05-08 DIAGNOSIS — R519 Headache, unspecified: Secondary | ICD-10-CM | POA: Diagnosis not present

## 2020-05-08 DIAGNOSIS — M9901 Segmental and somatic dysfunction of cervical region: Secondary | ICD-10-CM | POA: Diagnosis not present

## 2020-05-08 DIAGNOSIS — M7918 Myalgia, other site: Secondary | ICD-10-CM | POA: Diagnosis not present

## 2020-05-08 DIAGNOSIS — M5413 Radiculopathy, cervicothoracic region: Secondary | ICD-10-CM | POA: Diagnosis not present

## 2020-05-15 DIAGNOSIS — M5413 Radiculopathy, cervicothoracic region: Secondary | ICD-10-CM | POA: Diagnosis not present

## 2020-05-15 DIAGNOSIS — M7918 Myalgia, other site: Secondary | ICD-10-CM | POA: Diagnosis not present

## 2020-05-15 DIAGNOSIS — M9901 Segmental and somatic dysfunction of cervical region: Secondary | ICD-10-CM | POA: Diagnosis not present

## 2020-05-15 DIAGNOSIS — M5412 Radiculopathy, cervical region: Secondary | ICD-10-CM | POA: Diagnosis not present

## 2020-05-15 DIAGNOSIS — M542 Cervicalgia: Secondary | ICD-10-CM | POA: Diagnosis not present

## 2020-05-15 DIAGNOSIS — R519 Headache, unspecified: Secondary | ICD-10-CM | POA: Diagnosis not present

## 2020-05-15 DIAGNOSIS — M9900 Segmental and somatic dysfunction of head region: Secondary | ICD-10-CM | POA: Diagnosis not present

## 2020-05-17 DIAGNOSIS — R296 Repeated falls: Secondary | ICD-10-CM | POA: Diagnosis not present

## 2020-05-17 DIAGNOSIS — Z981 Arthrodesis status: Secondary | ICD-10-CM | POA: Diagnosis not present

## 2020-05-17 DIAGNOSIS — G35 Multiple sclerosis: Secondary | ICD-10-CM | POA: Diagnosis not present

## 2020-05-17 DIAGNOSIS — M25512 Pain in left shoulder: Secondary | ICD-10-CM | POA: Diagnosis not present

## 2020-05-17 DIAGNOSIS — R262 Difficulty in walking, not elsewhere classified: Secondary | ICD-10-CM | POA: Diagnosis not present

## 2020-05-17 DIAGNOSIS — M4180 Other forms of scoliosis, site unspecified: Secondary | ICD-10-CM | POA: Diagnosis not present

## 2020-05-17 DIAGNOSIS — M25511 Pain in right shoulder: Secondary | ICD-10-CM | POA: Diagnosis not present

## 2020-05-21 DIAGNOSIS — H25813 Combined forms of age-related cataract, bilateral: Secondary | ICD-10-CM | POA: Diagnosis not present

## 2020-05-24 ENCOUNTER — Ambulatory Visit (INDEPENDENT_AMBULATORY_CARE_PROVIDER_SITE_OTHER): Payer: Medicare Other | Admitting: Dermatology

## 2020-05-24 ENCOUNTER — Other Ambulatory Visit: Payer: Self-pay

## 2020-05-24 DIAGNOSIS — L814 Other melanin hyperpigmentation: Secondary | ICD-10-CM

## 2020-05-24 DIAGNOSIS — D1800 Hemangioma unspecified site: Secondary | ICD-10-CM | POA: Diagnosis not present

## 2020-05-24 DIAGNOSIS — L821 Other seborrheic keratosis: Secondary | ICD-10-CM

## 2020-05-24 DIAGNOSIS — D229 Melanocytic nevi, unspecified: Secondary | ICD-10-CM

## 2020-05-24 DIAGNOSIS — L578 Other skin changes due to chronic exposure to nonionizing radiation: Secondary | ICD-10-CM

## 2020-05-24 DIAGNOSIS — Z1283 Encounter for screening for malignant neoplasm of skin: Secondary | ICD-10-CM

## 2020-05-24 DIAGNOSIS — L820 Inflamed seborrheic keratosis: Secondary | ICD-10-CM | POA: Diagnosis not present

## 2020-05-24 DIAGNOSIS — L304 Erythema intertrigo: Secondary | ICD-10-CM | POA: Diagnosis not present

## 2020-05-24 DIAGNOSIS — L853 Xerosis cutis: Secondary | ICD-10-CM

## 2020-05-24 DIAGNOSIS — Z85828 Personal history of other malignant neoplasm of skin: Secondary | ICD-10-CM

## 2020-05-24 NOTE — Progress Notes (Signed)
   Follow-Up Visit   Subjective  Carly [name] is a 69 y.o. female who presents for the following: TBSE and skin cancer screening and rash  Patient presents today for TBSE and skin cancer screening. She does have an area of concern on her lower abdomen, thinks she may have a heat rash, patient has been using gold bond powder that she would like to have evaluated. She has a history of BCC on her nose.   The following portions of the chart were reviewed this encounter and updated as appropriate:  Tobacco  Allergies  Meds  Problems  Med Hx  Surg Hx  Fam Hx      Review of Systems:  No other skin or systemic complaints except as noted in HPI or Assessment and Plan.  Objective  Well appearing patient in no apparent distress; mood and affect are within normal limits.  A full examination was performed including scalp, head, eyes, ears, nose, lips, neck, chest, axillae, abdomen, back, buttocks, bilateral upper extremities, bilateral lower extremities, hands, feet, fingers, toes, fingernails, and toenails. All findings within normal limits unless otherwise noted below.  Objective  Left Shoulder - Anterior: Erythematous keratotic or waxy stuck-on papule or plaque.   Objective  Right Inguinal Area: Macerated erythematous patch   Assessment & Plan  Inflamed seborrheic keratosis Left Shoulder - Anterior  Benign-appearing.  Observation.  Call clinic for new or changing lesions.  Recommend daily use of broad spectrum spf 30+ sunscreen to sun-exposed areas.    Erythema intertrigo Right Inguinal Area  Start ketoconazole 2 % cream  apply to affected area twice daily until clear Start Hydrocortisone 2.5% cream apply to affected area  twice daily for up two 2 weeks until clear Recommend Zeasorb powder   Lentigines - Scattered tan macules - Discussed due to sun exposure - Benign, observe - Call for any changes  Seborrheic Keratoses - Stuck-on, waxy, tan-brown papules and  plaques  - Discussed benign etiology and prognosis. - Observe - Call for any changes  Melanocytic Nevi - Tan-brown and/or pink-flesh-colored symmetric macules and papules - Benign appearing on exam today - Observation - Call clinic for new or changing moles - Recommend daily use of broad spectrum spf 30+ sunscreen to sun-exposed areas.   Hemangiomas - Red papules - Discussed benign nature - Observe - Call for any changes  Actinic Damage - diffuse scaly erythematous macules with underlying dyspigmentation - Recommend daily broad spectrum sunscreen SPF 30+ to sun-exposed areas, reapply every 2 hours as needed.  - Call for new or changing lesions.  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  - Call if any new or changing lesions are noted between office visits   Skin cancer screening performed today.   Return for TBSE.  I, Donzetta Kohut, CMA, am acting as scribe for Forest Gleason, MD .  Documentation: I have reviewed the above documentation for accuracy and completeness, and I agree with the above.  Forest Gleason, MD

## 2020-05-24 NOTE — Patient Instructions (Addendum)
Recommend daily broad spectrum sunscreen SPF 30+ to sun-exposed areas, reapply every 2 hours as needed. Call for new or changing lesions.  Gentle Skin Care Guide  1. Bathe no more than once a day.  2. Avoid bathing in hot water  3. Use a mild soap like Dove, Vanicream, Cetaphil, CeraVe. Can use Lever 2000 or Cetaphil antibacterial soap  4. Use soap only where you need it. On most days, use it under your arms, between your legs, and on your feet. Let the water rinse other areas unless visibly dirty.  5. When you get out of the bath/shower, use a towel to gently blot your skin dry, don't rub it.  6. While your skin is still a little damp, apply a moisturizing cream such as Vanicream, CeraVe, Cetaphil, Eucerin, Sarna lotion or plain Vaseline Jelly. For hands apply Neutrogena [place] Hand Cream or Excipial Hand Cream.  7. Reapply moisturizer any time you start to itch or feel dry.  8. Sometimes using free and clear laundry detergents can be helpful. Fabric softener sheets should be avoided. Downy Free & Gentle liquid, or any liquid fabric softener that is free of dyes and perfumes, it acceptable to use  9. If your doctor has given you prescription creams you may apply moisturizers over them     Topical steroids (such as triamcinolone, fluocinolone, fluocinonide, mometasone, clobetasol, halobetasol, betamethasone, hydrocortisone) can cause thinning and lightening of the skin if they are used for too long in the same area. Your physician has selected the right strength medicine for your problem and area affected on the body. Please use your medication only as directed by your physician to prevent side effects.    [facility]

## 2020-05-29 DIAGNOSIS — M25511 Pain in right shoulder: Secondary | ICD-10-CM | POA: Diagnosis not present

## 2020-05-29 DIAGNOSIS — M25512 Pain in left shoulder: Secondary | ICD-10-CM | POA: Diagnosis not present

## 2020-05-29 DIAGNOSIS — M4180 Other forms of scoliosis, site unspecified: Secondary | ICD-10-CM | POA: Diagnosis not present

## 2020-05-29 DIAGNOSIS — R262 Difficulty in walking, not elsewhere classified: Secondary | ICD-10-CM | POA: Diagnosis not present

## 2020-05-29 DIAGNOSIS — G35 Multiple sclerosis: Secondary | ICD-10-CM | POA: Diagnosis not present

## 2020-05-29 DIAGNOSIS — R296 Repeated falls: Secondary | ICD-10-CM | POA: Diagnosis not present

## 2020-05-30 DIAGNOSIS — M9900 Segmental and somatic dysfunction of head region: Secondary | ICD-10-CM | POA: Diagnosis not present

## 2020-05-30 DIAGNOSIS — M9901 Segmental and somatic dysfunction of cervical region: Secondary | ICD-10-CM | POA: Diagnosis not present

## 2020-05-30 DIAGNOSIS — R519 Headache, unspecified: Secondary | ICD-10-CM | POA: Diagnosis not present

## 2020-05-30 DIAGNOSIS — M5412 Radiculopathy, cervical region: Secondary | ICD-10-CM | POA: Diagnosis not present

## 2020-05-30 DIAGNOSIS — M7918 Myalgia, other site: Secondary | ICD-10-CM | POA: Diagnosis not present

## 2020-05-30 DIAGNOSIS — M542 Cervicalgia: Secondary | ICD-10-CM | POA: Diagnosis not present

## 2020-05-30 DIAGNOSIS — M5413 Radiculopathy, cervicothoracic region: Secondary | ICD-10-CM | POA: Diagnosis not present

## 2020-05-31 ENCOUNTER — Ambulatory Visit: Payer: Medicare Other | Admitting: Dermatology

## 2020-05-31 DIAGNOSIS — M25512 Pain in left shoulder: Secondary | ICD-10-CM | POA: Diagnosis not present

## 2020-05-31 DIAGNOSIS — G35 Multiple sclerosis: Secondary | ICD-10-CM | POA: Diagnosis not present

## 2020-05-31 DIAGNOSIS — R262 Difficulty in walking, not elsewhere classified: Secondary | ICD-10-CM | POA: Diagnosis not present

## 2020-05-31 DIAGNOSIS — M4180 Other forms of scoliosis, site unspecified: Secondary | ICD-10-CM | POA: Diagnosis not present

## 2020-05-31 DIAGNOSIS — R296 Repeated falls: Secondary | ICD-10-CM | POA: Diagnosis not present

## 2020-05-31 DIAGNOSIS — M25511 Pain in right shoulder: Secondary | ICD-10-CM | POA: Diagnosis not present

## 2020-06-05 DIAGNOSIS — R262 Difficulty in walking, not elsewhere classified: Secondary | ICD-10-CM | POA: Diagnosis not present

## 2020-06-05 DIAGNOSIS — M25512 Pain in left shoulder: Secondary | ICD-10-CM | POA: Diagnosis not present

## 2020-06-05 DIAGNOSIS — G35 Multiple sclerosis: Secondary | ICD-10-CM | POA: Diagnosis not present

## 2020-06-05 DIAGNOSIS — M25511 Pain in right shoulder: Secondary | ICD-10-CM | POA: Diagnosis not present

## 2020-06-05 DIAGNOSIS — M4180 Other forms of scoliosis, site unspecified: Secondary | ICD-10-CM | POA: Diagnosis not present

## 2020-06-05 DIAGNOSIS — R29898 Other symptoms and signs involving the musculoskeletal system: Secondary | ICD-10-CM | POA: Diagnosis not present

## 2020-06-05 DIAGNOSIS — Z79899 Other long term (current) drug therapy: Secondary | ICD-10-CM | POA: Diagnosis not present

## 2020-06-05 DIAGNOSIS — R296 Repeated falls: Secondary | ICD-10-CM | POA: Diagnosis not present

## 2020-06-07 DIAGNOSIS — M4180 Other forms of scoliosis, site unspecified: Secondary | ICD-10-CM | POA: Diagnosis not present

## 2020-06-07 DIAGNOSIS — R262 Difficulty in walking, not elsewhere classified: Secondary | ICD-10-CM | POA: Diagnosis not present

## 2020-06-07 DIAGNOSIS — M25512 Pain in left shoulder: Secondary | ICD-10-CM | POA: Diagnosis not present

## 2020-06-07 DIAGNOSIS — R296 Repeated falls: Secondary | ICD-10-CM | POA: Diagnosis not present

## 2020-06-07 DIAGNOSIS — M25511 Pain in right shoulder: Secondary | ICD-10-CM | POA: Diagnosis not present

## 2020-06-07 DIAGNOSIS — G35 Multiple sclerosis: Secondary | ICD-10-CM | POA: Diagnosis not present

## 2020-06-08 ENCOUNTER — Encounter: Payer: Self-pay | Admitting: Dermatology

## 2020-06-08 MED ORDER — HYDROCORTISONE 2.5 % EX CREA: TOPICAL_CREAM | Freq: Two times a day (BID) | CUTANEOUS | 11 refills | Status: DC | PRN

## 2020-06-08 MED ORDER — KETOCONAZOLE 2 % EX CREA: 1.0000 "application " | TOPICAL_CREAM | Freq: Two times a day (BID) | CUTANEOUS | 11 refills | Status: DC

## 2020-06-11 DIAGNOSIS — H6123 Impacted cerumen, bilateral: Secondary | ICD-10-CM | POA: Diagnosis not present

## 2020-06-11 DIAGNOSIS — R0602 Shortness of breath: Secondary | ICD-10-CM | POA: Diagnosis not present

## 2020-06-11 DIAGNOSIS — J301 Allergic rhinitis due to pollen: Secondary | ICD-10-CM | POA: Diagnosis not present

## 2020-06-12 DIAGNOSIS — R296 Repeated falls: Secondary | ICD-10-CM | POA: Diagnosis not present

## 2020-06-12 DIAGNOSIS — M25511 Pain in right shoulder: Secondary | ICD-10-CM | POA: Diagnosis not present

## 2020-06-12 DIAGNOSIS — M25512 Pain in left shoulder: Secondary | ICD-10-CM | POA: Diagnosis not present

## 2020-06-12 DIAGNOSIS — R262 Difficulty in walking, not elsewhere classified: Secondary | ICD-10-CM | POA: Diagnosis not present

## 2020-06-12 DIAGNOSIS — M4180 Other forms of scoliosis, site unspecified: Secondary | ICD-10-CM | POA: Diagnosis not present

## 2020-06-12 DIAGNOSIS — G35 Multiple sclerosis: Secondary | ICD-10-CM | POA: Diagnosis not present

## 2020-06-13 ENCOUNTER — Telehealth: Payer: Self-pay

## 2020-06-13 NOTE — Telephone Encounter (Signed)
Called patient and she will have her husband come by the office to get a  release of information to sign, then I will sent it to Dr. Elveria Rising office

## 2020-06-14 ENCOUNTER — Ambulatory Visit: Payer: Medicare Other | Admitting: Dermatology

## 2020-06-14 DIAGNOSIS — Z981 Arthrodesis status: Secondary | ICD-10-CM | POA: Diagnosis not present

## 2020-06-14 DIAGNOSIS — M25511 Pain in right shoulder: Secondary | ICD-10-CM | POA: Diagnosis not present

## 2020-06-14 DIAGNOSIS — G35 Multiple sclerosis: Secondary | ICD-10-CM | POA: Diagnosis not present

## 2020-06-14 DIAGNOSIS — M25512 Pain in left shoulder: Secondary | ICD-10-CM | POA: Diagnosis not present

## 2020-06-14 DIAGNOSIS — R262 Difficulty in walking, not elsewhere classified: Secondary | ICD-10-CM | POA: Diagnosis not present

## 2020-06-14 DIAGNOSIS — M4180 Other forms of scoliosis, site unspecified: Secondary | ICD-10-CM | POA: Diagnosis not present

## 2020-06-14 DIAGNOSIS — R296 Repeated falls: Secondary | ICD-10-CM | POA: Diagnosis not present

## 2020-06-19 DIAGNOSIS — M25512 Pain in left shoulder: Secondary | ICD-10-CM | POA: Diagnosis not present

## 2020-06-19 DIAGNOSIS — M25511 Pain in right shoulder: Secondary | ICD-10-CM | POA: Diagnosis not present

## 2020-06-19 DIAGNOSIS — R262 Difficulty in walking, not elsewhere classified: Secondary | ICD-10-CM | POA: Diagnosis not present

## 2020-06-19 DIAGNOSIS — R296 Repeated falls: Secondary | ICD-10-CM | POA: Diagnosis not present

## 2020-06-19 DIAGNOSIS — G35 Multiple sclerosis: Secondary | ICD-10-CM | POA: Diagnosis not present

## 2020-06-19 DIAGNOSIS — M4180 Other forms of scoliosis, site unspecified: Secondary | ICD-10-CM | POA: Diagnosis not present

## 2020-06-21 DIAGNOSIS — G35 Multiple sclerosis: Secondary | ICD-10-CM | POA: Diagnosis not present

## 2020-06-21 DIAGNOSIS — M25512 Pain in left shoulder: Secondary | ICD-10-CM | POA: Diagnosis not present

## 2020-06-21 DIAGNOSIS — R262 Difficulty in walking, not elsewhere classified: Secondary | ICD-10-CM | POA: Diagnosis not present

## 2020-06-21 DIAGNOSIS — M25511 Pain in right shoulder: Secondary | ICD-10-CM | POA: Diagnosis not present

## 2020-06-21 DIAGNOSIS — M4180 Other forms of scoliosis, site unspecified: Secondary | ICD-10-CM | POA: Diagnosis not present

## 2020-06-21 DIAGNOSIS — R296 Repeated falls: Secondary | ICD-10-CM | POA: Diagnosis not present

## 2020-06-26 DIAGNOSIS — G35 Multiple sclerosis: Secondary | ICD-10-CM | POA: Diagnosis not present

## 2020-06-26 DIAGNOSIS — M4180 Other forms of scoliosis, site unspecified: Secondary | ICD-10-CM | POA: Diagnosis not present

## 2020-06-26 DIAGNOSIS — R262 Difficulty in walking, not elsewhere classified: Secondary | ICD-10-CM | POA: Diagnosis not present

## 2020-06-26 DIAGNOSIS — M25512 Pain in left shoulder: Secondary | ICD-10-CM | POA: Diagnosis not present

## 2020-06-26 DIAGNOSIS — R296 Repeated falls: Secondary | ICD-10-CM | POA: Diagnosis not present

## 2020-06-26 DIAGNOSIS — M25511 Pain in right shoulder: Secondary | ICD-10-CM | POA: Diagnosis not present

## 2020-06-28 DIAGNOSIS — M4180 Other forms of scoliosis, site unspecified: Secondary | ICD-10-CM | POA: Diagnosis not present

## 2020-06-28 DIAGNOSIS — M25511 Pain in right shoulder: Secondary | ICD-10-CM | POA: Diagnosis not present

## 2020-06-28 DIAGNOSIS — M25512 Pain in left shoulder: Secondary | ICD-10-CM | POA: Diagnosis not present

## 2020-06-28 DIAGNOSIS — R262 Difficulty in walking, not elsewhere classified: Secondary | ICD-10-CM | POA: Diagnosis not present

## 2020-06-28 DIAGNOSIS — R296 Repeated falls: Secondary | ICD-10-CM | POA: Diagnosis not present

## 2020-06-28 DIAGNOSIS — G35 Multiple sclerosis: Secondary | ICD-10-CM | POA: Diagnosis not present

## 2020-07-02 DIAGNOSIS — M542 Cervicalgia: Secondary | ICD-10-CM | POA: Diagnosis not present

## 2020-07-02 DIAGNOSIS — M7918 Myalgia, other site: Secondary | ICD-10-CM | POA: Diagnosis not present

## 2020-07-02 DIAGNOSIS — M9901 Segmental and somatic dysfunction of cervical region: Secondary | ICD-10-CM | POA: Diagnosis not present

## 2020-07-02 DIAGNOSIS — M5413 Radiculopathy, cervicothoracic region: Secondary | ICD-10-CM | POA: Diagnosis not present

## 2020-07-02 DIAGNOSIS — R519 Headache, unspecified: Secondary | ICD-10-CM | POA: Diagnosis not present

## 2020-07-02 DIAGNOSIS — M5412 Radiculopathy, cervical region: Secondary | ICD-10-CM | POA: Diagnosis not present

## 2020-07-02 DIAGNOSIS — M9900 Segmental and somatic dysfunction of head region: Secondary | ICD-10-CM | POA: Diagnosis not present

## 2020-07-03 ENCOUNTER — Other Ambulatory Visit: Payer: Self-pay | Admitting: Internal Medicine

## 2020-07-03 DIAGNOSIS — Z1231 Encounter for screening mammogram for malignant neoplasm of breast: Secondary | ICD-10-CM

## 2020-07-04 DIAGNOSIS — R519 Headache, unspecified: Secondary | ICD-10-CM | POA: Diagnosis not present

## 2020-07-04 DIAGNOSIS — M5413 Radiculopathy, cervicothoracic region: Secondary | ICD-10-CM | POA: Diagnosis not present

## 2020-07-04 DIAGNOSIS — M5412 Radiculopathy, cervical region: Secondary | ICD-10-CM | POA: Diagnosis not present

## 2020-07-04 DIAGNOSIS — M542 Cervicalgia: Secondary | ICD-10-CM | POA: Diagnosis not present

## 2020-07-04 DIAGNOSIS — M9901 Segmental and somatic dysfunction of cervical region: Secondary | ICD-10-CM | POA: Diagnosis not present

## 2020-07-04 DIAGNOSIS — M7918 Myalgia, other site: Secondary | ICD-10-CM | POA: Diagnosis not present

## 2020-07-04 DIAGNOSIS — M9900 Segmental and somatic dysfunction of head region: Secondary | ICD-10-CM | POA: Diagnosis not present

## 2020-07-05 DIAGNOSIS — R296 Repeated falls: Secondary | ICD-10-CM | POA: Diagnosis not present

## 2020-07-05 DIAGNOSIS — R262 Difficulty in walking, not elsewhere classified: Secondary | ICD-10-CM | POA: Diagnosis not present

## 2020-07-05 DIAGNOSIS — M25511 Pain in right shoulder: Secondary | ICD-10-CM | POA: Diagnosis not present

## 2020-07-05 DIAGNOSIS — M25512 Pain in left shoulder: Secondary | ICD-10-CM | POA: Diagnosis not present

## 2020-07-05 DIAGNOSIS — G35 Multiple sclerosis: Secondary | ICD-10-CM | POA: Diagnosis not present

## 2020-07-05 DIAGNOSIS — M4180 Other forms of scoliosis, site unspecified: Secondary | ICD-10-CM | POA: Diagnosis not present

## 2020-07-10 DIAGNOSIS — M25512 Pain in left shoulder: Secondary | ICD-10-CM | POA: Diagnosis not present

## 2020-07-10 DIAGNOSIS — R296 Repeated falls: Secondary | ICD-10-CM | POA: Diagnosis not present

## 2020-07-10 DIAGNOSIS — R262 Difficulty in walking, not elsewhere classified: Secondary | ICD-10-CM | POA: Diagnosis not present

## 2020-07-10 DIAGNOSIS — G35 Multiple sclerosis: Secondary | ICD-10-CM | POA: Diagnosis not present

## 2020-07-10 DIAGNOSIS — M25511 Pain in right shoulder: Secondary | ICD-10-CM | POA: Diagnosis not present

## 2020-07-10 DIAGNOSIS — M4180 Other forms of scoliosis, site unspecified: Secondary | ICD-10-CM | POA: Diagnosis not present

## 2020-07-17 DIAGNOSIS — Z981 Arthrodesis status: Secondary | ICD-10-CM | POA: Diagnosis not present

## 2020-07-17 DIAGNOSIS — R262 Difficulty in walking, not elsewhere classified: Secondary | ICD-10-CM | POA: Diagnosis not present

## 2020-07-17 DIAGNOSIS — M25512 Pain in left shoulder: Secondary | ICD-10-CM | POA: Diagnosis not present

## 2020-07-17 DIAGNOSIS — R296 Repeated falls: Secondary | ICD-10-CM | POA: Diagnosis not present

## 2020-07-17 DIAGNOSIS — G35 Multiple sclerosis: Secondary | ICD-10-CM | POA: Diagnosis not present

## 2020-07-17 DIAGNOSIS — M25511 Pain in right shoulder: Secondary | ICD-10-CM | POA: Diagnosis not present

## 2020-07-17 DIAGNOSIS — M4180 Other forms of scoliosis, site unspecified: Secondary | ICD-10-CM | POA: Diagnosis not present

## 2020-07-19 DIAGNOSIS — G35 Multiple sclerosis: Secondary | ICD-10-CM | POA: Diagnosis not present

## 2020-07-19 DIAGNOSIS — R262 Difficulty in walking, not elsewhere classified: Secondary | ICD-10-CM | POA: Diagnosis not present

## 2020-07-19 DIAGNOSIS — M25511 Pain in right shoulder: Secondary | ICD-10-CM | POA: Diagnosis not present

## 2020-07-19 DIAGNOSIS — R296 Repeated falls: Secondary | ICD-10-CM | POA: Diagnosis not present

## 2020-07-19 DIAGNOSIS — M25512 Pain in left shoulder: Secondary | ICD-10-CM | POA: Diagnosis not present

## 2020-07-19 DIAGNOSIS — M4180 Other forms of scoliosis, site unspecified: Secondary | ICD-10-CM | POA: Diagnosis not present

## 2020-07-24 DIAGNOSIS — M25511 Pain in right shoulder: Secondary | ICD-10-CM | POA: Diagnosis not present

## 2020-07-24 DIAGNOSIS — R262 Difficulty in walking, not elsewhere classified: Secondary | ICD-10-CM | POA: Diagnosis not present

## 2020-07-24 DIAGNOSIS — M4180 Other forms of scoliosis, site unspecified: Secondary | ICD-10-CM | POA: Diagnosis not present

## 2020-07-24 DIAGNOSIS — G35 Multiple sclerosis: Secondary | ICD-10-CM | POA: Diagnosis not present

## 2020-07-24 DIAGNOSIS — R296 Repeated falls: Secondary | ICD-10-CM | POA: Diagnosis not present

## 2020-07-24 DIAGNOSIS — M25512 Pain in left shoulder: Secondary | ICD-10-CM | POA: Diagnosis not present

## 2020-07-25 DIAGNOSIS — M9901 Segmental and somatic dysfunction of cervical region: Secondary | ICD-10-CM | POA: Diagnosis not present

## 2020-07-25 DIAGNOSIS — M5412 Radiculopathy, cervical region: Secondary | ICD-10-CM | POA: Diagnosis not present

## 2020-07-25 DIAGNOSIS — M5413 Radiculopathy, cervicothoracic region: Secondary | ICD-10-CM | POA: Diagnosis not present

## 2020-07-25 DIAGNOSIS — M7918 Myalgia, other site: Secondary | ICD-10-CM | POA: Diagnosis not present

## 2020-07-25 DIAGNOSIS — M9900 Segmental and somatic dysfunction of head region: Secondary | ICD-10-CM | POA: Diagnosis not present

## 2020-07-25 DIAGNOSIS — R519 Headache, unspecified: Secondary | ICD-10-CM | POA: Diagnosis not present

## 2020-07-25 DIAGNOSIS — M542 Cervicalgia: Secondary | ICD-10-CM | POA: Diagnosis not present

## 2020-07-26 DIAGNOSIS — R296 Repeated falls: Secondary | ICD-10-CM | POA: Diagnosis not present

## 2020-07-26 DIAGNOSIS — M4180 Other forms of scoliosis, site unspecified: Secondary | ICD-10-CM | POA: Diagnosis not present

## 2020-07-26 DIAGNOSIS — M25512 Pain in left shoulder: Secondary | ICD-10-CM | POA: Diagnosis not present

## 2020-07-26 DIAGNOSIS — G35 Multiple sclerosis: Secondary | ICD-10-CM | POA: Diagnosis not present

## 2020-07-26 DIAGNOSIS — R262 Difficulty in walking, not elsewhere classified: Secondary | ICD-10-CM | POA: Diagnosis not present

## 2020-07-26 DIAGNOSIS — M25511 Pain in right shoulder: Secondary | ICD-10-CM | POA: Diagnosis not present

## 2020-07-30 DIAGNOSIS — R519 Headache, unspecified: Secondary | ICD-10-CM | POA: Diagnosis not present

## 2020-07-30 DIAGNOSIS — M5413 Radiculopathy, cervicothoracic region: Secondary | ICD-10-CM | POA: Diagnosis not present

## 2020-07-30 DIAGNOSIS — M5412 Radiculopathy, cervical region: Secondary | ICD-10-CM | POA: Diagnosis not present

## 2020-07-30 DIAGNOSIS — M9901 Segmental and somatic dysfunction of cervical region: Secondary | ICD-10-CM | POA: Diagnosis not present

## 2020-07-30 DIAGNOSIS — M7918 Myalgia, other site: Secondary | ICD-10-CM | POA: Diagnosis not present

## 2020-07-30 DIAGNOSIS — M542 Cervicalgia: Secondary | ICD-10-CM | POA: Diagnosis not present

## 2020-07-30 DIAGNOSIS — M9900 Segmental and somatic dysfunction of head region: Secondary | ICD-10-CM | POA: Diagnosis not present

## 2020-08-02 DIAGNOSIS — M7918 Myalgia, other site: Secondary | ICD-10-CM | POA: Diagnosis not present

## 2020-08-02 DIAGNOSIS — M9900 Segmental and somatic dysfunction of head region: Secondary | ICD-10-CM | POA: Diagnosis not present

## 2020-08-02 DIAGNOSIS — M5412 Radiculopathy, cervical region: Secondary | ICD-10-CM | POA: Diagnosis not present

## 2020-08-02 DIAGNOSIS — M5413 Radiculopathy, cervicothoracic region: Secondary | ICD-10-CM | POA: Diagnosis not present

## 2020-08-02 DIAGNOSIS — M542 Cervicalgia: Secondary | ICD-10-CM | POA: Diagnosis not present

## 2020-08-02 DIAGNOSIS — M9901 Segmental and somatic dysfunction of cervical region: Secondary | ICD-10-CM | POA: Diagnosis not present

## 2020-08-02 DIAGNOSIS — R519 Headache, unspecified: Secondary | ICD-10-CM | POA: Diagnosis not present

## 2020-08-09 ENCOUNTER — Other Ambulatory Visit: Payer: Self-pay

## 2020-08-09 ENCOUNTER — Ambulatory Visit
Admission: RE | Admit: 2020-08-09 | Discharge: 2020-08-09 | Disposition: A | Discharge status: Home / Self Care | Payer: Medicare Other | Source: Ambulatory Visit | Attending: Internal Medicine | Admitting: Internal Medicine

## 2020-08-09 DIAGNOSIS — Z1231 Encounter for screening mammogram for malignant neoplasm of breast: Secondary | ICD-10-CM

## 2020-08-17 ENCOUNTER — Other Ambulatory Visit: Payer: Self-pay

## 2020-08-17 ENCOUNTER — Other Ambulatory Visit (INDEPENDENT_AMBULATORY_CARE_PROVIDER_SITE_OTHER): Payer: Medicare Other

## 2020-08-17 DIAGNOSIS — R739 Hyperglycemia, unspecified: Secondary | ICD-10-CM

## 2020-08-17 DIAGNOSIS — E7800 Pure hypercholesterolemia, unspecified: Secondary | ICD-10-CM | POA: Diagnosis not present

## 2020-08-17 LAB — HEPATIC FUNCTION PANEL
ALT: 24 U/L (ref 0–35)
AST: 20 U/L (ref 0–37)
Albumin: 4.4 g/dL (ref 3.5–5.2)
Alkaline Phosphatase: 104 U/L (ref 39–117)
Bilirubin, Direct: 0.1 mg/dL (ref 0.0–0.3)
Total Bilirubin: 0.3 mg/dL (ref 0.2–1.2)
Total Protein: 6.5 g/dL (ref 6.0–8.3)

## 2020-08-17 LAB — LIPID PANEL
Cholesterol: 228 mg/dL — ABNORMAL HIGH (ref 0–200)
HDL: 63.1 mg/dL (ref >39.00)
NonHDL: 165.04
Total CHOL/HDL Ratio: 4
Triglycerides: 206 mg/dL — ABNORMAL HIGH (ref 0.0–149.0)
VLDL: 41.2 mg/dL — ABNORMAL HIGH (ref 0.0–40.0)

## 2020-08-17 LAB — BASIC METABOLIC PANEL
BUN: 8 mg/dL (ref 6–23)
CO2: 24 mEq/L (ref 19–32)
Calcium: 9.4 mg/dL (ref 8.4–10.5)
Chloride: 106 mEq/L (ref 96–112)
Creatinine, Ser: 0.65 mg/dL (ref 0.40–1.20)
GFR: 89.41 mL/min (ref >60.00)
Glucose, Bld: 117 mg/dL — ABNORMAL HIGH (ref 70–99)
Potassium: 4.1 mEq/L (ref 3.5–5.1)
Sodium: 142 mEq/L (ref 135–145)

## 2020-08-17 LAB — HEMOGLOBIN A1C: Hgb A1c MFr Bld: 6.1 % (ref 4.6–6.5)

## 2020-08-17 LAB — LDL CHOLESTEROL, DIRECT: Direct LDL: 127 mg/dL

## 2020-08-20 ENCOUNTER — Other Ambulatory Visit: Payer: Self-pay

## 2020-08-20 ENCOUNTER — Telehealth (INDEPENDENT_AMBULATORY_CARE_PROVIDER_SITE_OTHER): Payer: Medicare Other | Admitting: Internal Medicine

## 2020-08-20 DIAGNOSIS — K5900 Constipation, unspecified: Secondary | ICD-10-CM

## 2020-08-20 DIAGNOSIS — G35 Multiple sclerosis: Secondary | ICD-10-CM | POA: Diagnosis not present

## 2020-08-20 DIAGNOSIS — M545 Low back pain, unspecified: Secondary | ICD-10-CM | POA: Diagnosis not present

## 2020-08-20 DIAGNOSIS — R739 Hyperglycemia, unspecified: Secondary | ICD-10-CM

## 2020-08-20 DIAGNOSIS — E7800 Pure hypercholesterolemia, unspecified: Secondary | ICD-10-CM

## 2020-08-20 DIAGNOSIS — R140 Abdominal distension (gaseous): Secondary | ICD-10-CM

## 2020-08-20 DIAGNOSIS — R7989 Other specified abnormal findings of blood chemistry: Secondary | ICD-10-CM

## 2020-08-20 DIAGNOSIS — R945 Abnormal results of liver function studies: Secondary | ICD-10-CM

## 2020-08-20 DIAGNOSIS — F339 Major depressive disorder, recurrent, unspecified: Secondary | ICD-10-CM | POA: Diagnosis not present

## 2020-08-20 NOTE — Assessment & Plan Note (Addendum)
The 10-year ASCVD risk score Mikey Bussing DC Brooke Bonito., et al., 2013) is: 22.6%   Values used to calculate the score:     Age: 69 years     Sex: Female     Is Non-Hispanic African American: No     Diabetic: Yes     Tobacco smoker: No     Systolic Blood Pressure: 284 mmHg     Is BP treated: No     HDL Cholesterol: 63.1 mg/dL     Total Cholesterol: 228 mg/dL   Discussed calculated cholesterol risk.  Discussed starting medication.  She wants to hold at this time.  Follow lipid panel.

## 2020-08-20 NOTE — Progress Notes (Signed)
Patient ID: Carly [name], female   DOB: Jul 02, 1951, 69 y.o.   MRN: [id number]   Virtual Visit via video Note  This visit type was conducted due to national recommendations for restrictions regarding the COVID-19 pandemic (e.g. social distancing).  This format is felt to be most appropriate for this patient at this time.  All issues noted in this document were discussed and addressed.  No physical exam was performed (except for noted visual exam findings with Video Visits).   I connected with Carly [name] by a video enabled telemedicine application and verified that I am speaking with the correct person using two identifiers. Location patient: home Location provider: work  Persons participating in the virtual visit: patient, provider and pts husband.   The limitations, risks, security and privacy concerns of performing an evaluation and management service by video and the availability of in person appointments have been discussed. It has also been discussed with the patient that there may be a patient responsible charge related to this service. The patient expressed understanding and agreed to proceed.   Reason for visit: scheduled follow up.   HPI: Is followed by neurology for her multiple sclerosis.  Stable.  Last seen 06/05/20.  Previously saw orthopedics/spine surgeon - recommended aquatic therapy/low impact exercises.  Her bowels are doing better.  No constipation.  She is scheduled for colonoscopy next month.  Starting approximately two months ago, reports bloating.  No nausea or vomiting. Discussed prilosec for reflux.  Main concern is the discomfort and bloating.  Is eating.  No chest pain or sob reported.      ROS: See pertinent positives and negatives per HPI.  Past Medical History:  Diagnosis Date  . Allergy   . Anxiety   . Aspiration pneumonia ([place])   . Depression   . Frequent headaches    H/O  . GERD (gastroesophageal reflux disease)   . History of chicken pox     . History of colon polyps   . Hx of migraines   . Multiple sclerosis ([place])   . PONV (postoperative nausea and vomiting)     Past Surgical History:  Procedure Laterality Date  . BACK SURGERY    . CHOLECYSTECTOMY    . COLONOSCOPY WITH PROPOFOL N/A 05/18/2017   Procedure: COLONOSCOPY WITH PROPOFOL;  Surgeon: [name], MD;  Location[facility] ENDOSCOPY;  Service: Endoscopy;  Laterality: N/A;  . FOOT SURGERY  2015  . GALLBLADDER SURGERY  2008  . HARDWARE REMOVAL Left 02/14/2016   Procedure: LEFT FOOT REMOVAL DEEP IMPLANT;  Surgeon: [name], MD;  Location: [place];  Service: Orthopedics;  Laterality: Left;  . HERNIA REPAIR     Inguinal Hernia Repair  . SPINE SURGERY  2014    Family History  Problem Relation Age of Onset  . Arthritis Mother   . Hypertension Mother   . Macular degeneration Mother   . Hypertension Father   . Hyperlipidemia Father   . Heart disease Maternal Grandfather   . Diabetes Maternal Grandfather   . Kidney disease Paternal Grandmother     SOCIAL HX: reviewed.    Current Outpatient Medications:  .  acetaminophen (TYLENOL) 325 MG tablet, Take 650 mg by mouth every 4 (four) hours as needed., Disp: , Rfl:  .  B Complex Vitamins (VITAMIN B COMPLEX PO), Take by mouth., Disp: , Rfl:  .  Calcium-Phosphorus-Vitamin D (CITRACAL +D3 PO), Take by mouth. 2 tablets twice a day, Disp: , Rfl:  .  Cholecalciferol (  D3-1000 PO), Take by mouth., Disp: , Rfl:  .  Coenzyme Q10 (CO Q10) 100 MG CAPS, Take by mouth., Disp: , Rfl:  .  dalfampridine 10 MG TB12, Take 10 mg by mouth every 12 (twelve) hours., Disp: , Rfl:  .  DULoxetine (CYMBALTA) 60 MG capsule, Take 60 mg by mouth daily., Disp: , Rfl:  .  fexofenadine (ALLEGRA ALLERGY) 180 MG tablet, Take 1 tablet (180 mg total) by mouth daily., Disp: 30 tablet, Rfl: 2 .  fluticasone (FLONASE) 50 MCG/ACT nasal spray, Place 1 spray into both nostrils daily., Disp: 16 g, Rfl: 2 .  hydrocortisone (ANUSOL-HC) 25 MG  suppository, Place 1 suppository (25 mg total) rectally 2 (two) times daily., Disp: 12 suppository, Rfl: 0 .  hydrocortisone 2.5 % cream, Apply topically 2 (two) times daily as needed (Rash)., Disp: 30 g, Rfl: 11 .  L-Methylfolate-Algae (DEPLIN 15 PO), Take 15 mg by mouth 1 day or 1 dose., Disp: , Rfl:  .  LORazepam (ATIVAN) 0.5 MG tablet, Take 0.5 mg by mouth at bedtime., Disp: , Rfl:  .  magnesium oxide (MAG-OX) 400 MG tablet, TAKE ONE TABLET EVERY DAY, Disp: 30 tablet, Rfl: 5 .  mirtazapine (REMERON) 30 MG tablet, Take 30 mg by mouth daily., Disp: , Rfl:  .  ondansetron (ZOFRAN ODT) 4 MG disintegrating tablet, Take 1 tablet (4 mg total) by mouth every 8 (eight) hours as needed for nausea or vomiting., Disp: 30 tablet, Rfl: 0 .  oxazepam (SERAX) 10 MG capsule, Take 10 mg by mouth at bedtime as needed for sleep or anxiety., Disp: , Rfl:  .  sodium chloride (V-R NASAL SPRAY SALINE) 0.65 % nasal spray, Place into the nose., Disp: , Rfl:  .  tiZANidine (ZANAFLEX) 4 MG tablet, Take 1 tablet by mouth 3 (three) times daily as needed., Disp: , Rfl:  .  traZODone (DESYREL) 50 MG tablet, Take 50 mg by mouth at bedtime. , Disp: , Rfl: 12 No current facility-administered medications for this visit.  Facility-Administered Medications Ordered in Other Visits:  .  0.9 %  sodium chloride infusion, , Intravenous, Continuous, Corcoran, Melissa C, MD, Last Rate: 10 mL/hr at 04/25/16 0855, New Bag at 04/25/16 0855 .  acetaminophen (TYLENOL) tablet 650 mg, 650 mg, Oral, Once, Corcoran, Melissa C, MD  EXAM:  GENERAL: alert, oriented, appears well and in no acute distress  HEENT: atraumatic, conjunttiva clear, no obvious abnormalities on inspection of external nose and ears  NECK: normal movements of the head and neck  LUNGS: on inspection no signs of respiratory distress, breathing rate appears normal, no obvious gross SOB, gasping or wheezing  CV: no obvious cyanosis  PSYCH/NEURO: pleasant and cooperative,  no obvious depression or anxiety, speech and thought processing grossly intact  ASSESSMENT AND PLAN:  Discussed the following assessment and plan:  Problem List Items Addressed This Visit    Multiple sclerosis ([place])    On dalfampridine and tizanidine.  Stable.  Continue f/u with neurology.        Hyperglycemia    Low carb diet and exercise.  Follow met b and a1c.       Hypercholesterolemia    The 10-year ASCVD risk score Mikey Bussing DC Jr., et al., 2013) is: 22.6%   Values used to calculate the score:     Age: 70 years     Sex: Female     Is Non-Hispanic African American: No     Diabetic: Yes     Tobacco smoker: No  Systolic Blood Pressure: 191 mmHg     Is BP treated: No     HDL Cholesterol: 63.1 mg/dL     Total Cholesterol: 228 mg/dL   Discussed calculated cholesterol risk.  Discussed starting medication.  She wants to hold at this time.  Follow lipid panel.       Depression, recurrent [facility])    Being followed by psychiatry - [name].  Continue cymbalta, remeron.       Constipation    Is seeing GI. Planning for colonoscopy.  Bowels moving better now.  Does have the increased bloating and discomfort as outlined.  D/w GI regarding EGD.  Prilosec.        Bloating    Bowels moving better.  Increased bloating.  Persistent intermittent bloating and discomfort.  Planning for colonoscopy next month.  Discussed CT.  prilosec for reflux.  May need EGD as well.  Monitor for triggers.  Will contact GI.       Back pain    Seeing surgery.  Had recent MRI.  Note reviewed.  Recommended aquatic therapy and exercises.       Abnormal liver function tests    Diet and exercise.  Follow liver function tests.            I discussed the assessment and treatment plan with the patient. The patient was provided an opportunity to ask questions and all were answered. The patient agreed with the plan and demonstrated an understanding of the instructions.   The patient was advised to call back  or seek an in-person evaluation if the symptoms worsen or if the condition fails to improve as anticipated.    Einar Pheasant, MD

## 2020-08-25 ENCOUNTER — Encounter: Payer: Self-pay | Admitting: Internal Medicine

## 2020-08-25 DIAGNOSIS — R140 Abdominal distension (gaseous): Secondary | ICD-10-CM | POA: Insufficient documentation

## 2020-08-25 NOTE — Assessment & Plan Note (Signed)
Diet and exercise.  Follow liver function tests.   

## 2020-08-25 NOTE — Assessment & Plan Note (Addendum)
Bowels moving better.  Increased bloating.  Persistent intermittent bloating and discomfort.  Planning for colonoscopy next month.  Discussed CT.  prilosec for reflux.  May need EGD as well.  Monitor for triggers.  Will contact GI.

## 2020-08-25 NOTE — Assessment & Plan Note (Addendum)
Being followed by psychiatry - [name].  Continue cymbalta, remeron.

## 2020-08-25 NOTE — Assessment & Plan Note (Signed)
Low carb diet and exercise.  Follow met b and a1c.  

## 2020-08-25 NOTE — Assessment & Plan Note (Addendum)
On dalfampridine and tizanidine.  Stable.  Continue f/u with neurology.

## 2020-08-25 NOTE — Assessment & Plan Note (Signed)
Seeing surgery.  Had recent MRI.  Note reviewed.  Recommended aquatic therapy and exercises.

## 2020-08-25 NOTE — Assessment & Plan Note (Signed)
Is seeing GI. Planning for colonoscopy.  Bowels moving better now.  Does have the increased bloating and discomfort as outlined.  D/w GI regarding EGD.  Prilosec.

## 2020-08-27 DIAGNOSIS — M542 Cervicalgia: Secondary | ICD-10-CM | POA: Diagnosis not present

## 2020-08-27 DIAGNOSIS — M7918 Myalgia, other site: Secondary | ICD-10-CM | POA: Diagnosis not present

## 2020-08-27 DIAGNOSIS — M5412 Radiculopathy, cervical region: Secondary | ICD-10-CM | POA: Diagnosis not present

## 2020-08-27 DIAGNOSIS — M5413 Radiculopathy, cervicothoracic region: Secondary | ICD-10-CM | POA: Diagnosis not present

## 2020-08-27 DIAGNOSIS — M9901 Segmental and somatic dysfunction of cervical region: Secondary | ICD-10-CM | POA: Diagnosis not present

## 2020-08-27 DIAGNOSIS — R519 Headache, unspecified: Secondary | ICD-10-CM | POA: Diagnosis not present

## 2020-08-27 DIAGNOSIS — M9900 Segmental and somatic dysfunction of head region: Secondary | ICD-10-CM | POA: Diagnosis not present

## 2020-08-31 ENCOUNTER — Telehealth: Payer: Self-pay

## 2020-08-31 NOTE — Telephone Encounter (Signed)
Pt states that she has stomach pain and wants to know if she should take 80mg  of Prilosec. Please advise

## 2020-09-03 NOTE — Telephone Encounter (Signed)
Pt states that 80mg  of Prilosec gave her diarrhea and pharmacy suggested that she only take 20mg . Juluis Rainier

## 2020-09-03 NOTE — Telephone Encounter (Signed)
LMTCB

## 2020-09-04 NOTE — Telephone Encounter (Signed)
LMTCB

## 2020-09-11 NOTE — Telephone Encounter (Signed)
Left detailed message to call back if she needs anything.

## 2020-09-14 ENCOUNTER — Other Ambulatory Visit: Payer: Self-pay | Admitting: Internal Medicine

## 2020-09-18 DIAGNOSIS — R1013 Epigastric pain: Secondary | ICD-10-CM | POA: Diagnosis not present

## 2020-09-18 DIAGNOSIS — K219 Gastro-esophageal reflux disease without esophagitis: Secondary | ICD-10-CM | POA: Diagnosis not present

## 2020-09-18 DIAGNOSIS — Z86010 Personal history of colonic polyps: Secondary | ICD-10-CM | POA: Diagnosis not present

## 2020-09-18 DIAGNOSIS — K5909 Other constipation: Secondary | ICD-10-CM | POA: Diagnosis not present

## 2020-09-18 DIAGNOSIS — R140 Abdominal distension (gaseous): Secondary | ICD-10-CM | POA: Diagnosis not present

## 2020-09-20 DIAGNOSIS — M5413 Radiculopathy, cervicothoracic region: Secondary | ICD-10-CM | POA: Diagnosis not present

## 2020-09-20 DIAGNOSIS — M9900 Segmental and somatic dysfunction of head region: Secondary | ICD-10-CM | POA: Diagnosis not present

## 2020-09-20 DIAGNOSIS — M9901 Segmental and somatic dysfunction of cervical region: Secondary | ICD-10-CM | POA: Diagnosis not present

## 2020-09-20 DIAGNOSIS — M542 Cervicalgia: Secondary | ICD-10-CM | POA: Diagnosis not present

## 2020-09-20 DIAGNOSIS — R519 Headache, unspecified: Secondary | ICD-10-CM | POA: Diagnosis not present

## 2020-09-20 DIAGNOSIS — M7918 Myalgia, other site: Secondary | ICD-10-CM | POA: Diagnosis not present

## 2020-09-20 DIAGNOSIS — M5412 Radiculopathy, cervical region: Secondary | ICD-10-CM | POA: Diagnosis not present

## 2020-09-27 ENCOUNTER — Telehealth: Payer: Self-pay | Admitting: Internal Medicine

## 2020-09-27 NOTE — Telephone Encounter (Signed)
Patient stated she has had swelling feet for the past month due to eating a lot of popcorn covered in salt and butter. Patient has had this issue previously. She has been using compression socks and elevating her feet. Swelling has not gone down. She has been taking B6 100mg  everyday because she was told it would help with swelling. It has not worked. She would like a medication to help with the swelling. She is not having any sx of heart palpitations, hypertension, blurry vision, lightheadedness, dizziness, chest pain, arm px, jaw px, and headaches.

## 2020-09-27 NOTE — Telephone Encounter (Signed)
Pt scheduled with Dr Scott 

## 2020-09-27 NOTE — Telephone Encounter (Signed)
Patient called about this morning's telephone call.

## 2020-09-27 NOTE — Telephone Encounter (Signed)
Needs to stop the increased sodium.  Continue leg elevation.  Can schedule an appt to discuss treatment options.  Need to determine what medication needed.

## 2020-09-27 NOTE — Telephone Encounter (Signed)
Patient called stated that her feet are swollen she stated that she ate to much salt on her popcorn swollen every since

## 2020-09-28 ENCOUNTER — Ambulatory Visit (INDEPENDENT_AMBULATORY_CARE_PROVIDER_SITE_OTHER): Payer: Medicare Other | Admitting: Internal Medicine

## 2020-09-28 ENCOUNTER — Encounter: Payer: Self-pay | Admitting: Internal Medicine

## 2020-09-28 ENCOUNTER — Other Ambulatory Visit: Payer: Self-pay

## 2020-09-28 DIAGNOSIS — G35 Multiple sclerosis: Secondary | ICD-10-CM | POA: Diagnosis not present

## 2020-09-28 DIAGNOSIS — R600 Localized edema: Secondary | ICD-10-CM

## 2020-09-28 DIAGNOSIS — R739 Hyperglycemia, unspecified: Secondary | ICD-10-CM

## 2020-09-28 DIAGNOSIS — K5900 Constipation, unspecified: Secondary | ICD-10-CM

## 2020-09-28 DIAGNOSIS — R609 Edema, unspecified: Secondary | ICD-10-CM

## 2020-09-28 DIAGNOSIS — F339 Major depressive disorder, recurrent, unspecified: Secondary | ICD-10-CM

## 2020-09-28 LAB — BASIC METABOLIC PANEL WITH GFR
BUN: 7 mg/dL (ref 6–23)
CO2: 26 meq/L (ref 19–32)
Calcium: 10.1 mg/dL (ref 8.4–10.5)
Chloride: 103 meq/L (ref 96–112)
Creatinine, Ser: 0.65 mg/dL (ref 0.40–1.20)
GFR: 89.34 mL/min
Glucose, Bld: 90 mg/dL (ref 70–99)
Potassium: 4 meq/L (ref 3.5–5.1)
Sodium: 140 meq/L (ref 135–145)

## 2020-09-28 LAB — HEPATIC FUNCTION PANEL
ALT: 28 U/L (ref 0–35)
AST: 26 U/L (ref 0–37)
Albumin: 4.7 g/dL (ref 3.5–5.2)
Alkaline Phosphatase: 102 U/L (ref 39–117)
Bilirubin, Direct: 0.1 mg/dL (ref 0.0–0.3)
Total Bilirubin: 0.5 mg/dL (ref 0.2–1.2)
Total Protein: 7 g/dL (ref 6.0–8.3)

## 2020-09-28 LAB — CBC WITH DIFFERENTIAL/PLATELET
Basophils Absolute: 0.1 10*3/uL (ref 0.0–0.1)
Basophils Relative: 0.6 % (ref 0.0–3.0)
Eosinophils Absolute: 0.2 10*3/uL (ref 0.0–0.7)
Eosinophils Relative: 1.9 % (ref 0.0–5.0)
HCT: 43.3 % (ref 36.0–46.0)
Hemoglobin: 14.2 g/dL (ref 12.0–15.0)
Lymphocytes Relative: 29.7 % (ref 12.0–46.0)
Lymphs Abs: 2.5 10*3/uL (ref 0.7–4.0)
MCHC: 32.7 g/dL (ref 30.0–36.0)
MCV: 83.2 fl (ref 78.0–100.0)
Monocytes Absolute: 0.5 10*3/uL (ref 0.1–1.0)
Monocytes Relative: 6.1 % (ref 3.0–12.0)
Neutro Abs: 5.2 10*3/uL (ref 1.4–7.7)
Neutrophils Relative %: 61.7 % (ref 43.0–77.0)
Platelets: 324 10*3/uL (ref 150.0–400.0)
RBC: 5.2 Mil/uL — ABNORMAL HIGH (ref 3.87–5.11)
RDW: 15.2 % (ref 11.5–15.5)
WBC: 8.5 10*3/uL (ref 4.0–10.5)

## 2020-09-28 LAB — TSH: TSH: 1.83 u[IU]/mL (ref 0.35–4.50)

## 2020-09-28 MED ORDER — FUROSEMIDE 20 MG PO TABS: 20.0000 mg | ORAL_TABLET | Freq: Every day | ORAL | 0 refills | Status: DC | PRN

## 2020-09-28 NOTE — Patient Instructions (Signed)
Take lasix one per day for three days.  Call with update beginning of week.

## 2020-09-28 NOTE — Progress Notes (Signed)
Patient ID: Carly Carly [name], female   DOB: Jul 28, 1951, 69 y.o.   MRN: [id number]   Subjective:    Patient ID: Carly Carly [name], female    DOB: 1951-06-12, 69 y.o.   MRN: [id number]  HPI This visit occurred during the SARS-CoV-2 public health emergency.  Safety protocols were in place, including screening questions prior to the visit, additional usage of staff PPE, and extensive cleaning of exam room while observing appropriate contact time as indicated for disinfecting solutions.  Patient here as a work in with concerns regarding lower extremity swelling.  She is accompanied by her husband.  History obtained from both of them.  Has had some issues with lower extremity swelling previously.  Over the last few weeks, her swelling has increased.  She has been eating an increased amount of salty popcorn and eating sandwiches with a high sodium content.  Lower extremity swelling has increased.  Increased pain in her joints from the swelling.  Affects her tying to bend over.  No chest pain or increased sob.  Able to lie down flat without increased sob.  No cough or congestion.     Past Medical History:  Diagnosis Date  . Allergy   . Anxiety   . Aspiration pneumonia ([place])   . Depression   . Frequent headaches    H/O  . GERD (gastroesophageal reflux disease)   . History of chicken pox   . History of colon polyps   . Hx of migraines   . Multiple sclerosis ([place])   . PONV (postoperative nausea and vomiting)    Past Surgical History:  Procedure Laterality Date  . BACK SURGERY    . CHOLECYSTECTOMY    . COLONOSCOPY WITH PROPOFOL N/A 05/18/2017   Procedure: COLONOSCOPY WITH PROPOFOL;  Surgeon: [name], MD;  Location: [facility] ENDOSCOPY;  Service: Endoscopy;  Laterality: N/A;  . FOOT SURGERY  2015  . GALLBLADDER SURGERY  2008  . HARDWARE REMOVAL Left 02/14/2016   Procedure: LEFT FOOT REMOVAL DEEP IMPLANT;  Surgeon: [name], MD;  Location: [facility];  Service: Orthopedics;   Laterality: Left;  . HERNIA REPAIR     Inguinal Hernia Repair  . SPINE SURGERY  2014   Family History  Problem Relation Age of Onset  . Arthritis Mother   . Hypertension Mother   . Macular degeneration Mother   . Hypertension Father   . Hyperlipidemia Father   . Heart disease Maternal Grandfather   . Diabetes Maternal Grandfather   . Kidney disease Paternal Grandmother    Social History   Socioeconomic History  . Marital status: Married    Spouse Carly [name]: Not on file  . Number of children: Not on file  . Years of education: Not on file  . Highest education level: Not on file  Occupational History  . Not on file  Tobacco Use  . Smoking status: Never Smoker  . Smokeless tobacco: Never Used  Vaping Use  . Vaping Use: Never used  Substance and Sexual Activity  . Alcohol use: No    Alcohol/week: 0.0 standard drinks  . Drug use: No  . Sexual activity: Not Currently  Other Topics Concern  . Not on file  Social History Narrative   Married    Social Determinants of Health   Financial Resource Strain: Not on file  Food Insecurity: Not on file  Transportation Needs: Not on file  Physical Activity: Not on file  Stress: Not on file  Social Connections: Not on  file    Outpatient Encounter Medications as of 09/28/2020  Medication Sig  . acetaminophen (TYLENOL) 325 MG tablet Take 650 mg by mouth every 4 (four) hours as needed.  . B Complex Vitamins (VITAMIN B COMPLEX PO) Take by mouth.  . Calcium-Phosphorus-Vitamin D (CITRACAL +D3 PO) Take by mouth. 2 tablets twice a day  . Cholecalciferol (D3-1000 PO) Take by mouth.  . Coenzyme Q10 (CO Q10) 100 MG CAPS Take by mouth.  . dalfampridine 10 MG TB12 Take 10 mg by mouth every 12 (twelve) hours.  . DULoxetine (CYMBALTA) 60 MG capsule Take 60 mg by mouth daily.  . fexofenadine (ALLEGRA ALLERGY) 180 MG tablet Take 1 tablet (180 mg total) by mouth daily.  . fluticasone (FLONASE) 50 MCG/ACT nasal spray Place 1 spray into both nostrils  daily.  . furosemide (LASIX) 20 MG tablet Take 1 tablet (20 mg total) by mouth daily as needed.  . hydrocortisone (ANUSOL-HC) 25 MG suppository Place 1 suppository (25 mg total) rectally 2 (two) times daily.  . hydrocortisone 2.5 % cream Apply topically 2 (two) times daily as needed (Rash).  Marland Kitchen L-Methylfolate-Algae (DEPLIN 15 PO) Take 15 mg by mouth 1 day or 1 dose.  Marland Kitchen LORazepam (ATIVAN) 0.5 MG tablet Take 0.5 mg by mouth at bedtime.  . magnesium oxide (MAG-OX) 400 MG tablet TAKE ONE TABLET EVERY DAY  . Magnesium Oxide 400 (240 Mg) MG TABS TAKE ONE TABLET EVERY DAY  . mirtazapine (REMERON) 30 MG tablet Take 30 mg by mouth daily.  . ondansetron (ZOFRAN ODT) 4 MG disintegrating tablet Take 1 tablet (4 mg total) by mouth every 8 (eight) hours as needed for nausea or vomiting.  Marland Kitchen oxazepam (SERAX) 10 MG capsule Take 10 mg by mouth at bedtime as needed for sleep or anxiety.  . sodium chloride (V-R NASAL SPRAY SALINE) 0.65 % nasal spray Place into the nose.  Marland Kitchen tiZANidine (ZANAFLEX) 4 MG tablet Take 1 tablet by mouth 3 (three) times daily as needed.  . traZODone (DESYREL) 50 MG tablet Take 50 mg by mouth at bedtime.    Facility-Administered Encounter Medications as of 09/28/2020  Medication  . 0.9 %  sodium chloride infusion  . acetaminophen (TYLENOL) tablet 650 mg    Review of Systems  Constitutional: Negative for appetite change and fever.  HENT: Negative for congestion and sinus pressure.   Respiratory: Negative for cough, chest tightness and shortness of breath.   Cardiovascular: Positive for leg swelling. Negative for chest pain and palpitations.  Gastrointestinal: Negative for abdominal pain, diarrhea, nausea and vomiting.  Genitourinary: Negative for difficulty urinating and dysuria.  Musculoskeletal:       Increased joint pain that she relates to the increased swelling.   Skin: Negative for color change and rash.  Neurological: Negative for dizziness, light-headedness and headaches.   Psychiatric/Behavioral: Negative for agitation and dysphoric mood.       Objective:    Physical Exam Vitals reviewed.  Constitutional:      General: She is not in acute distress.    Appearance: Normal appearance.  HENT:     Head: Normocephalic and atraumatic.     Right Ear: External ear normal.     Left Ear: External ear normal.     Mouth/Throat:     Mouth: Oropharynx is clear and moist.  Eyes:     General: No scleral icterus.       Right eye: No discharge.        Left eye: No discharge.  Conjunctiva/sclera: Conjunctivae normal.  Neck:     Thyroid: No thyromegaly.  Cardiovascular:     Rate and Rhythm: Normal rate and regular rhythm.  Pulmonary:     Effort: No respiratory distress.     Breath sounds: Normal breath sounds. No wheezing.  Abdominal:     General: Bowel sounds are normal.     Palpations: Abdomen is soft.     Tenderness: There is no abdominal tenderness.  Musculoskeletal:        General: No tenderness or edema.     Cervical back: Neck supple. No tenderness.     Comments: Increased pedal and lower extremity swelling - pitting - pedal and ankle edema.   Lymphadenopathy:     Cervical: No cervical adenopathy.  Skin:    Findings: No erythema or rash.  Neurological:     Mental Status: She is alert.  Psychiatric:        Mood and Affect: Mood normal.        Behavior: Behavior normal.     BP 118/70   Pulse 91   Temp 98.1 F (36.7 C) (Oral)   Resp 16   Wt 177 lb (80.3 kg)   SpO2 97%   BMI 31.35 kg/m  Wt Readings from Last 3 Encounters:  09/28/20 177 lb (80.3 kg)  08/20/20 174 lb (78.9 kg)  04/18/20 174 lb (78.9 kg)     Lab Results  Component Value Date   WBC 8.5 09/28/2020   HGB 14.2 09/28/2020   HCT 43.3 09/28/2020   PLT 324.0 09/28/2020   GLUCOSE 90 09/28/2020   CHOL 228 (H) 08/17/2020   TRIG 206.0 (H) 08/17/2020   HDL 63.10 08/17/2020   LDLDIRECT 127.0 08/17/2020   LDLCALC 154 (H) 04/11/2020   ALT 28 09/28/2020   AST 26 09/28/2020    NA 140 09/28/2020   K 4.0 09/28/2020   CL 103 09/28/2020   CREATININE 0.65 09/28/2020   BUN 7 09/28/2020   CO2 26 09/28/2020   TSH 1.83 09/28/2020   HGBA1C 6.1 08/17/2020    MM 3D SCREEN BREAST BILATERAL  Result Date: 08/10/2020 CLINICAL DATA:  Screening. EXAM: DIGITAL SCREENING BILATERAL MAMMOGRAM WITH TOMO AND CAD COMPARISON:  Previous exam(s). ACR Breast Density Category c: The breast tissue is heterogeneously dense, which may obscure small masses. FINDINGS: There are no findings suspicious for malignancy. Images were processed with CAD. IMPRESSION: No mammographic evidence of malignancy. A result letter of this screening mammogram will be mailed directly to the patient. RECOMMENDATION: Screening mammogram in one year. (Code:SM-B-01Y) BI-RADS CATEGORY  1: Negative. Electronically Signed   By: Everlean Alstrom M.D.   On: 08/10/2020 12:59       Assessment & Plan:   Problem List Items Addressed This Visit    Depression, recurrent ([place])    Has been on cymbalta and remeron.  Followed by psychiatry.        Multiple sclerosis ([place])    Has been on dalfampridine and tizanidine.  Stable. Continue f/u with neurology.        Lower extremity edema    Increased swelling as outlined.  EKG  - SR with no acute ischemic changes.  Discussed the need to limit sodium intake.  Also leg elevation.  Compression hose.  Check metabolic panel, albumin and tsh.  Lasix 20mg  q day for three days.  Call with update in a few days.  Follow.  Denies sob or chest pain       Constipation    Bowels  better        Hyperglycemia    Low carb diet and exercise.        Edema    Increased swelling as outlined.  EKG  - SR with no acute ischemic changes.  Discussed the need to limit sodium intake.  Also leg elevation.  Compression hose.  Check metabolic panel, albumin and tsh.  Lasix 20mg  q day for three days.  Call with update in a few days.  Follow.  Denies sob or chest pain       Relevant Orders   EKG  12-Lead (Completed)   CBC with Differential/Platelet (Completed)   TSH (Completed)   Hepatic function panel (Completed)   Basic metabolic panel (Completed)       Einar Pheasant, MD

## 2020-09-29 ENCOUNTER — Encounter: Payer: Self-pay | Admitting: Internal Medicine

## 2020-09-29 NOTE — Assessment & Plan Note (Signed)
Increased swelling as outlined.  EKG  - SR with no acute ischemic changes.  Discussed the need to limit sodium intake.  Also leg elevation.  Compression hose.  Check metabolic panel, albumin and tsh.  Lasix 20mg  q day for three days.  Call with update in a few days.  Follow.  Denies sob or chest pain

## 2020-09-29 NOTE — Assessment & Plan Note (Signed)
Has been on cymbalta and remeron.  Followed by psychiatry.

## 2020-09-29 NOTE — Assessment & Plan Note (Signed)
Bowels better

## 2020-09-29 NOTE — Assessment & Plan Note (Signed)
Low carb diet and exercise.   °

## 2020-09-29 NOTE — Assessment & Plan Note (Signed)
Has been on dalfampridine and tizanidine.  Stable.  Continue f/u with neurology.   

## 2020-10-02 ENCOUNTER — Telehealth: Payer: Self-pay | Admitting: Internal Medicine

## 2020-10-02 ENCOUNTER — Telehealth: Payer: Self-pay

## 2020-10-02 NOTE — Telephone Encounter (Signed)
LMTCB in regards to lab results.  

## 2020-10-02 NOTE — Telephone Encounter (Signed)
Waiting on Dr. Scott's response.  

## 2020-10-02 NOTE — Telephone Encounter (Signed)
See result note.  

## 2020-10-02 NOTE — Telephone Encounter (Signed)
Pt called back about lasix. She wants to know if she should be drinking more water and what can help with swelling-please advise

## 2020-10-02 NOTE — Telephone Encounter (Signed)
Patient called back about lasix pill  She stated that someone called her see missed the call

## 2020-10-02 NOTE — Telephone Encounter (Signed)
patient called in about fluid pill Dr.Scott put her wants a call back about this

## 2020-10-04 DIAGNOSIS — R42 Dizziness and giddiness: Secondary | ICD-10-CM | POA: Diagnosis not present

## 2020-10-04 DIAGNOSIS — H6123 Impacted cerumen, bilateral: Secondary | ICD-10-CM | POA: Diagnosis not present

## 2020-10-09 ENCOUNTER — Telehealth: Payer: Self-pay

## 2020-10-09 NOTE — Telephone Encounter (Signed)
Patient confirmed appointment on 10/09/20.

## 2020-10-09 NOTE — Telephone Encounter (Signed)
LM with appt date and time 10/17/20 @ 12pm And  to call back if it didn't work

## 2020-10-09 NOTE — Telephone Encounter (Signed)
Ok to schedule appt. If she wants to come in at 7:30 have 7:30 spots open or can schedule for 12:00 on 10/17/20. As in previous message, can use for a couple of more days and will have lasix then prn. Can discuss results at appt.

## 2020-10-09 NOTE — Telephone Encounter (Signed)
-----   Message from Dale Dilley, MD sent at 10/04/2020  4:15 AM EST ----- Ok to schedule appt.  If she wants to come in at 7:30 have 7:30 spots open or can schedule for 12:00 on 10/17/20.  As in previous message, can use for a couple of more days and will have lasix then prn.  Can discuss at appt.

## 2020-10-17 ENCOUNTER — Telehealth (INDEPENDENT_AMBULATORY_CARE_PROVIDER_SITE_OTHER): Payer: Medicare Other | Admitting: Internal Medicine

## 2020-10-17 DIAGNOSIS — R609 Edema, unspecified: Secondary | ICD-10-CM

## 2020-10-17 DIAGNOSIS — K5900 Constipation, unspecified: Secondary | ICD-10-CM

## 2020-10-17 NOTE — Progress Notes (Signed)
Patient ID: AALEAHYA KOLER, female   DOB: 1951-10-10, 70 y.o.   MRN: [id number]   Virtual Visit via video Note  This visit type was conducted due to national recommendations for restrictions regarding the COVID-19 pandemic (e.g. social distancing).  This format is felt to be most appropriate for this patient at this time.  All issues noted in this document were discussed and addressed.  No physical exam was performed (except for noted visual exam findings with Video Visits).   I connected with Geoffry Paradise by a video enabled telemedicine application and verified that I am speaking with the correct person using two identifiers. Location patient: home Location provider: work Persons participating in the virtual visit: patient, provider and pts husband  The limitations, risks, security and privacy concerns of performing an evaluation and management service by video and the availability of in person appointments have been discussed.  it has also been discussed with the patient that there may be a patient responsible charge related to this service. The patient expressed understanding and agreed to proceed.   Reason for visit: work in appt  HPI: She was recently seen for increased lower extremity swelling.  Was placed on lasix for a few days. See last note for details.  Swelling improved.  Over this past week, husband sick - head cold, fatigue.  Took two at home covid test - negative.  Approximately 5 days ago, she developed stomach cramping and pelvic pain.  Some minimal constipation.   Normal bowel movement today.  Ate lunch.  No nausea or vomiting.  Taking metamucil, magnesium and colace.  Mainly just noticing some abdominal cramping.  No chest congestion or increased cough reported.  No fever.  Leg swelling overall improved.  No increased sob reported.  Reported minimal headache - question sinus pressure - into teeth.  No significant congestion reported.    ROS: See pertinent positives and  negatives per HPI.  Past Medical History:  Diagnosis Date   Allergy    Anxiety    Aspiration pneumonia (HCC)    Depression    Frequent headaches    H/O   GERD (gastroesophageal reflux disease)    History of chicken pox    History of colon polyps    Hx of migraines    Multiple sclerosis (HCC)    PONV (postoperative nausea and vomiting)     Past Surgical History:  Procedure Laterality Date   BACK SURGERY     CHOLECYSTECTOMY     COLONOSCOPY WITH PROPOFOL N/A 05/18/2017   Procedure: COLONOSCOPY WITH PROPOFOL;  Surgeon: Scot Jun, MD;  Location: [facility];  Service: Endoscopy;  Laterality: N/A;   FOOT SURGERY  2015   GALLBLADDER SURGERY  2008   HARDWARE REMOVAL Left 02/14/2016   Procedure: LEFT FOOT REMOVAL DEEP IMPLANT;  Surgeon: Toni Arthurs, MD;  Location: [facility];  Service: Orthopedics;  Laterality: Left;   HERNIA REPAIR     Inguinal Hernia Repair   SPINE SURGERY  2014    Family History  Problem Relation Age of Onset   Arthritis Mother    Hypertension Mother    Macular degeneration Mother    Hypertension Father    Hyperlipidemia Father    Heart disease Maternal Grandfather    Diabetes Maternal Grandfather    Kidney disease Paternal Grandmother     SOCIAL HX: reviewed.    Current Outpatient Medications:    acetaminophen (TYLENOL) 325 MG tablet, Take 650 mg by mouth every 4 (four) hours  as needed., Disp: , Rfl:    B Complex Vitamins (VITAMIN B COMPLEX PO), Take by mouth., Disp: , Rfl:    Calcium-Phosphorus-Vitamin D (CITRACAL +D3 PO), Take by mouth. 2 tablets twice a day, Disp: , Rfl:    Cholecalciferol (D3-1000 PO), Take by mouth., Disp: , Rfl:    Coenzyme Q10 (CO Q10) 100 MG CAPS, Take by mouth., Disp: , Rfl:    dalfampridine 10 MG TB12, Take 10 mg by mouth every 12 (twelve) hours., Disp: , Rfl:    DULoxetine (CYMBALTA) 60 MG capsule, Take 60 mg by mouth daily., Disp: , Rfl:    fexofenadine (ALLEGRA  ALLERGY) 180 MG tablet, Take 1 tablet (180 mg total) by mouth daily., Disp: 30 tablet, Rfl: 2   fluticasone (FLONASE) 50 MCG/ACT nasal spray, Place 1 spray into both nostrils daily., Disp: 16 g, Rfl: 2   hydrocortisone (ANUSOL-HC) 25 MG suppository, Place 1 suppository (25 mg total) rectally 2 (two) times daily., Disp: 12 suppository, Rfl: 0   hydrocortisone 2.5 % cream, Apply topically 2 (two) times daily as needed (Rash)., Disp: 30 g, Rfl: 11   L-Methylfolate-Algae (DEPLIN 15 PO), Take 15 mg by mouth 1 day or 1 dose., Disp: , Rfl:    LORazepam (ATIVAN) 0.5 MG tablet, Take 0.5 mg by mouth at bedtime., Disp: , Rfl:    Magnesium Oxide 400 (240 Mg) MG TABS, TAKE ONE TABLET EVERY DAY, Disp: 30 tablet, Rfl: 1   mirtazapine (REMERON) 30 MG tablet, Take 30 mg by mouth daily., Disp: , Rfl:    ondansetron (ZOFRAN ODT) 4 MG disintegrating tablet, Take 1 tablet (4 mg total) by mouth every 8 (eight) hours as needed for nausea or vomiting., Disp: 30 tablet, Rfl: 0   oxazepam (SERAX) 10 MG capsule, Take 10 mg by mouth at bedtime as needed for sleep or anxiety., Disp: , Rfl:    sodium chloride (V-R NASAL SPRAY SALINE) 0.65 % nasal spray, Place into the nose., Disp: , Rfl:    tiZANidine (ZANAFLEX) 4 MG tablet, Take 1 tablet by mouth 3 (three) times daily as needed., Disp: , Rfl:    traZODone (DESYREL) 50 MG tablet, Take 50 mg by mouth at bedtime. , Disp: , Rfl: 12 No current facility-administered medications for this visit.  Facility-Administered Medications Ordered in Other Visits:    0.9 %  sodium chloride infusion, , Intravenous, Continuous, Corcoran, Melissa C, MD, Last Rate: 10 mL/hr at 04/25/16 0855, New Bag at 04/25/16 0855   acetaminophen (TYLENOL) tablet 650 mg, 650 mg, Oral, Once, Corcoran, Ferdie Ping, MD  EXAM:  GENERAL: alert, oriented, appears well and in no acute distress  HEENT: atraumatic, conjunttiva clear, no obvious abnormalities on inspection of external nose and  ears  NECK: normal movements of the head and neck  LUNGS: on inspection no signs of respiratory distress, breathing rate appears normal, no obvious gross SOB, gasping or wheezing  CV: no obvious cyanosis  PSYCH/NEURO: pleasant and cooperative, no obvious depression or anxiety, speech and thought processing grossly intact  ASSESSMENT AND PLAN:  Discussed the following assessment and plan:  Problem List Items Addressed This Visit    Constipation    Has not been an issue recently.  In the past week, noticed minimal constipation.  Taking metamucil, magnesium and colace.  Normal bowel movement today.  Continue current regimen.  Having some cramping.  Eating.  No vomiting reported.  Keep bowels moving.  Since had bowel movement today.  Monitor symptoms.  Call with update tomorrow.  Husband has been sick.  Discussed possible covid.  Discussed quarantine.  Call tomorrow with update.        Edema    Overall improved, but noticed still some increased swelling.  Discussed lasix.  Can try lasix x 2 days.  Discussed prn use with weight gain.  Compression hose.  Leg elevation.  Follow.            I discussed the assessment and treatment plan with the patient. The patient was provided an opportunity to ask questions and all were answered. The patient agreed with the plan and demonstrated an understanding of the instructions.   The patient was advised to call back or seek an in-person evaluation if the symptoms worsen or if the condition fails to improve as anticipated.    Einar Pheasant, MD

## 2020-10-18 ENCOUNTER — Other Ambulatory Visit: Admission: RE | Admit: 2020-10-18 | Payer: Medicare Other | Source: Ambulatory Visit

## 2020-10-22 ENCOUNTER — Encounter: Payer: Self-pay | Admitting: Internal Medicine

## 2020-10-22 ENCOUNTER — Telehealth (INDEPENDENT_AMBULATORY_CARE_PROVIDER_SITE_OTHER): Payer: Medicare Other | Admitting: Internal Medicine

## 2020-10-22 ENCOUNTER — Other Ambulatory Visit: Payer: Medicare Other

## 2020-10-22 ENCOUNTER — Telehealth: Payer: Self-pay | Admitting: Internal Medicine

## 2020-10-22 ENCOUNTER — Ambulatory Visit: Admit: 2020-10-22 | Payer: Medicare Other | Admitting: Internal Medicine

## 2020-10-22 DIAGNOSIS — R52 Pain, unspecified: Secondary | ICD-10-CM

## 2020-10-22 DIAGNOSIS — R600 Localized edema: Secondary | ICD-10-CM

## 2020-10-22 DIAGNOSIS — R059 Cough, unspecified: Secondary | ICD-10-CM

## 2020-10-22 DIAGNOSIS — G35 Multiple sclerosis: Secondary | ICD-10-CM | POA: Diagnosis not present

## 2020-10-22 SURGERY — COLONOSCOPY WITH PROPOFOL
Anesthesia: General

## 2020-10-22 NOTE — Telephone Encounter (Signed)
Patient called in requesting to be covid tested. Still having the abdominal cramping and over all not feeling well. Sounded congested over the phone. Are you ok with her just coming in to be tested or do we need to do another virtual visit? Patient is ok either way, she just wants to be tested.

## 2020-10-22 NOTE — Telephone Encounter (Signed)
Ms Alviar was sent last week.  Was having abdominal cramping.  Was supposed to call with update.  Please call and confirm doing ok.  Husband had been sick with "cold symptoms".  Need to see if she has developed any problems.

## 2020-10-22 NOTE — Telephone Encounter (Signed)
Pt is going to come in and be swabbed. She is having body aches and took a dose of theraflu. Do you want to order the swab for all 3 tests?

## 2020-10-22 NOTE — Telephone Encounter (Signed)
Yes test for all 3.

## 2020-10-22 NOTE — Addendum Note (Signed)
Addended by: Lars Masson on: 10/22/2020 01:10 PM   Modules accepted: Orders

## 2020-10-22 NOTE — Assessment & Plan Note (Signed)
Overall improved, but noticed still some increased swelling.  Discussed lasix.  Can try lasix x 2 days.  Discussed prn use with weight gain.  Compression hose.  Leg elevation.  Follow.

## 2020-10-22 NOTE — Telephone Encounter (Signed)
I am ok with her coming to get covid tested. Can schedule an appt this pm if needed.

## 2020-10-22 NOTE — Assessment & Plan Note (Addendum)
Has not been an issue recently.  In the past week, noticed minimal constipation.  Taking metamucil, magnesium and colace.  Normal bowel movement today.  Continue current regimen.  Having some cramping.  Eating.  No vomiting reported.  Keep bowels moving.  Since had bowel movement today.  Monitor symptoms.  Call with update tomorrow.  Husband has been sick.  Discussed possible covid.  Discussed quarantine.  Call tomorrow with update.

## 2020-10-22 NOTE — Telephone Encounter (Signed)
Tests ordered 

## 2020-10-22 NOTE — Progress Notes (Signed)
Patient ID: Carly [name], female   DOB: 07-Oct-1951, 70 y.o.   MRN: [id number]   Virtual Visit via video Note  This visit type was conducted due to national recommendations for restrictions regarding the COVID-19 pandemic (e.g. social distancing).  This format is felt to be most appropriate for this patient at this time.  All issues noted in this document were discussed and addressed.  No physical exam was performed (except for noted visual exam findings with Video Visits).   I connected with Carly [name] by a video enabled telemedicine application and verified that I am speaking with the correct person using two identifiers. Location patient: home Location provider: work  Persons participating in the virtual visit: patient, provider  The limitations, risks, security and privacy concerns of performing an evaluation and management service by video and the availability of in person appointments have been discussed.  It has also been discussed with the patient that there may be a patient responsible charge related to this service. The patient expressed understanding and agreed to proceed.   Reason for visit: work in appt.   HPI: Recently evaluated 10/17/20 with stomach cramping and pelvic pain.  Minimal constipation.  Reports abdominal cramping is better.  Has had bowel movement.  Some decreased appetite, but eating.  No nausea or vomiting.  No abdominal pain.  Legs are better. Recently evaluated for lower extremity swelling.  Took lasix for a few days.  Improved.  Recently developed cough - 5 days ago.  Reports stuffy nose.  No significant drainage.  No fever - temp 97.7.  No chest congestion, chest pain or increased sob reported. Took theraflu.     ROS: See pertinent positives and negatives per HPI.  Past Medical History:  Diagnosis Date  . Allergy   . Anxiety   . Aspiration pneumonia ([place])   . Depression   . Frequent headaches    H/O  . GERD (gastroesophageal reflux disease)    . History of chicken pox   . History of colon polyps   . Hx of migraines   . Multiple sclerosis ([place])   . PONV (postoperative nausea and vomiting)     Past Surgical History:  Procedure Laterality Date  . BACK SURGERY    . CHOLECYSTECTOMY    . COLONOSCOPY WITH PROPOFOL N/A 05/18/2017   Procedure: COLONOSCOPY WITH PROPOFOL;  Surgeon: [name], MD;  Location: [facility] END[facility]COPY;  Service: Endoscopy;  Laterality: N/A;  . FOOT SURGERY  2015  . GALLBLADDER SURGERY  2008  . HARDWARE REMOVAL Left 02/14/2016   Procedure: LEFT FOOT REMOVAL DEEP IMPLANT;  Surgeon: [name], MD;  Location: [place];  Service: Orthopedics;  Laterality: Left;  . HERNIA REPAIR     Inguinal Hernia Repair  . SPINE SURGERY  2014    Family History  Problem Relation Age of Onset  . Arthritis Mother   . Hypertension Mother   . Macular degeneration Mother   . Hypertension Father   . Hyperlipidemia Father   . Heart disease Maternal Grandfather   . Diabetes Maternal Grandfather   . Kidney disease Paternal Grandmother     SOCIAL HX: reviewed.    Current Outpatient Medications:  .  acetaminophen (TYLENOL) 325 MG tablet, Take 650 mg by mouth every 4 (four) hours as needed., Disp: , Rfl:  .  B Complex Vitamins (VITAMIN B COMPLEX PO), Take by mouth., Disp: , Rfl:  .  Calcium-Phosphorus-Vitamin D (CITRACAL +D3 PO), Take by mouth. 2 tablets twice  a day, Disp: , Rfl:  .  Cholecalciferol (D3-1000 PO), Take by mouth., Disp: , Rfl:  .  Coenzyme Q10 (CO Q10) 100 MG CAPS, Take by mouth., Disp: , Rfl:  .  dalfampridine 10 MG TB12, Take 10 mg by mouth every 12 (twelve) hours., Disp: , Rfl:  .  DULoxetine (CYMBALTA) 60 MG capsule, Take 60 mg by mouth daily., Disp: , Rfl:  .  fexofenadine (ALLEGRA ALLERGY) 180 MG tablet, Take 1 tablet (180 mg total) by mouth daily., Disp: 30 tablet, Rfl: 2 .  fluticasone (FLONASE) 50 MCG/ACT nasal spray, Place 1 spray into both nostrils daily., Disp: 16 g, Rfl: 2 .   hydrocortisone (ANUSOL-HC) 25 MG suppository, Place 1 suppository (25 mg total) rectally 2 (two) times daily., Disp: 12 suppository, Rfl: 0 .  hydrocortisone 2.5 % cream, Apply topically 2 (two) times daily as needed (Rash)., Disp: 30 g, Rfl: 11 .  L-Methylfolate-Algae (DEPLIN 15 PO), Take 15 mg by mouth 1 day or 1 dose., Disp: , Rfl:  .  LORazepam (ATIVAN) 0.5 MG tablet, Take 0.5 mg by mouth at bedtime., Disp: , Rfl:  .  Magnesium Oxide 400 (240 Mg) MG TABS, TAKE ONE TABLET EVERY DAY, Disp: 30 tablet, Rfl: 1 .  mirtazapine (REMERON) 30 MG tablet, Take 30 mg by mouth daily., Disp: , Rfl:  .  ondansetron (ZOFRAN ODT) 4 MG disintegrating tablet, Take 1 tablet (4 mg total) by mouth every 8 (eight) hours as needed for nausea or vomiting., Disp: 30 tablet, Rfl: 0 .  oxazepam (SERAX) 10 MG capsule, Take 10 mg by mouth at bedtime as needed for sleep or anxiety., Disp: , Rfl:  .  sodium chloride (V-R NASAL SPRAY SALINE) 0.65 % nasal spray, Place into the nose., Disp: , Rfl:  .  tiZANidine (ZANAFLEX) 4 MG tablet, Take 1 tablet by mouth 3 (three) times daily as needed., Disp: , Rfl:  .  traZODone (DESYREL) 50 MG tablet, Take 50 mg by mouth at bedtime. , Disp: , Rfl: 12 No current facility-administered medications for this visit.  Facility-Administered Medications Ordered in Other Visits:  .  0.9 %  sodium chloride infusion, , Intravenous, Continuous, Corcoran, Melissa C, MD, Last Rate: 10 mL/hr at 04/25/16 0855, New Bag at 04/25/16 0855 .  acetaminophen (TYLENOL) tablet 650 mg, 650 mg, Oral, Once, Corcoran, Melissa C, MD  EXAM:  GENERAL: alert, oriented, appears well and in no acute distress  HEENT: atraumatic, conjunttiva clear, no obvious abnormalities on inspection of external nose and ears  NECK: normal movements of the head and neck  LUNGS: on inspection no signs of respiratory distress, breathing rate appears normal, no obvious gross SOB, gasping or wheezing  CV: no obvious  cyanosis  PSYCH/NEURO: pleasant and cooperative, no obvious depression or anxiety, speech and thought processing grossly intact  ASSESSMENT AND PLAN:  Discussed the following assessment and plan:  Problem List Items Addressed This Visit    Cough    Symptoms as outlined - cough, stuffy nose as outlined.  Treat with saline nasal spray and steroid nasal spray as outlined.  Robitussin/mucinex as outlined.  Check covid swab.  Follow closely.  Call with update. Discussed quarantine.        Lower extremity edema    Swelling improved.  Has lasix to take prn.  Follow.        Multiple sclerosis ([place])    Has been on dalfampridine and tizanidine.  Stable.  Continue f/u with neurology.  I discussed the assessment and treatment plan with the patient. The patient was provided an opportunity to ask questions and all were answered. The patient agreed with the plan and demonstrated an understanding of the instructions.   The patient was advised to call back or seek an in-person evaluation if the symptoms worsen or if the condition fails to improve as anticipated.    Einar Pheasant, MD

## 2020-10-24 LAB — COVID-19, FLU A+B AND RSV
Influenza A, NAA: NOT DETECTED
Influenza B, NAA: NOT DETECTED
RSV, NAA: NOT DETECTED
SARS-CoV-2, NAA: NOT DETECTED

## 2020-10-28 ENCOUNTER — Encounter: Payer: Self-pay | Admitting: Internal Medicine

## 2020-10-28 NOTE — Assessment & Plan Note (Signed)
Has been on dalfampridine and tizanidine.  Stable.  Continue f/u with neurology.   

## 2020-10-28 NOTE — Assessment & Plan Note (Addendum)
Symptoms as outlined - cough, stuffy nose as outlined.  Treat with saline nasal spray and steroid nasal spray as outlined.  Robitussin/mucinex as outlined.  Check covid swab.  Follow closely.  Call with update. Discussed quarantine.

## 2020-10-28 NOTE — Assessment & Plan Note (Signed)
Swelling improved.  Has lasix to take prn.  Follow.

## 2020-11-02 ENCOUNTER — Ambulatory Visit (INDEPENDENT_AMBULATORY_CARE_PROVIDER_SITE_OTHER): Payer: Medicare Other

## 2020-11-02 VITALS — Ht 63.0 in | Wt 177.0 lb

## 2020-11-02 DIAGNOSIS — Z0000 Encounter for general adult medical examination without abnormal findings: Secondary | ICD-10-CM | POA: Diagnosis not present

## 2020-11-02 NOTE — Progress Notes (Addendum)
Subjective:   Carly [name] is a 70 y.o. female who presents for Medicare Annual (Subsequent) preventive examination.  Review of Systems    No ROS.  Medicare Wellness Virtual Visit.    Cardiac Risk Factors include: advanced age (>8men, >47 women);hypertension     Objective:    Today's Vitals   11/02/20 1241  Weight: 177 lb (80.3 kg)  Height: 5\' 3"  (1.6 m)   Body mass index is 31.35 kg/m.  Advanced Directives 11/02/2020 12/20/2017 11/17/2017 10/14/2017 05/18/2017 04/22/2017 03/10/2017  Does Patient Have a Medical Advance Directive? Yes Yes Yes Yes Yes Yes Yes  Type of Paramedic of [place];Living will [place];Living will Living will;Healthcare Power of [place];Living will - [place];Living will  Does patient want to make changes to medical advance directive? No - Patient declined No - Patient declined No - Patient declined - - - No - Patient declined  Copy of [place] in Chart? Yes - validated most recent copy scanned in chart (See row information) - No - copy requested - No - copy requested - No - copy requested  Would patient like information on creating a medical advance directive? - - - - - - -    Current Medications (verified) Outpatient Encounter Medications as of 11/02/2020  Medication Sig  . acetaminophen (TYLENOL) 325 MG tablet Take 650 mg by mouth every 4 (four) hours as needed.  . B Complex Vitamins (VITAMIN B COMPLEX PO) Take by mouth.  . Calcium-Phosphorus-Vitamin D (CITRACAL +D3 PO) Take by mouth. 2 tablets twice a day  . Cholecalciferol (D3-1000 PO) Take by mouth.  . Coenzyme Q10 (CO Q10) 100 MG CAPS Take by mouth.  . dalfampridine 10 MG TB12 Take 10 mg by mouth every 12 (twelve) hours.  . DULoxetine (CYMBALTA) 60 MG capsule Take 60 mg by mouth daily.  . fexofenadine (ALLEGRA ALLERGY) 180 MG tablet Take 1 tablet (180 mg total) by mouth daily.  .  fluticasone (FLONASE) 50 MCG/ACT nasal spray Place 1 spray into both nostrils daily.  . hydrocortisone (ANUSOL-HC) 25 MG suppository Place 1 suppository (25 mg total) rectally 2 (two) times daily.  . hydrocortisone 2.5 % cream Apply topically 2 (two) times daily as needed (Rash).  Marland Kitchen L-Methylfolate-Algae (DEPLIN 15 PO) Take 15 mg by mouth 1 day or 1 dose.  Marland Kitchen LORazepam (ATIVAN) 0.5 MG tablet Take 0.5 mg by mouth at bedtime.  . Magnesium Oxide 400 (240 Mg) MG TABS TAKE ONE TABLET EVERY DAY  . mirtazapine (REMERON) 30 MG tablet Take 30 mg by mouth daily.  . ondansetron (ZOFRAN ODT) 4 MG disintegrating tablet Take 1 tablet (4 mg total) by mouth every 8 (eight) hours as needed for nausea or vomiting.  Marland Kitchen oxazepam (SERAX) 10 MG capsule Take 10 mg by mouth at bedtime as needed for sleep or anxiety.  . sodium chloride (V-R NASAL SPRAY SALINE) 0.65 % nasal spray Place into the nose.  Marland Kitchen tiZANidine (ZANAFLEX) 4 MG tablet Take 1 tablet by mouth 3 (three) times daily as needed.  . traZODone (DESYREL) 50 MG tablet Take 50 mg by mouth at bedtime.    Facility-Administered Encounter Medications as of 11/02/2020  Medication  . 0.9 %  sodium chloride infusion  . acetaminophen (TYLENOL) tablet 650 mg    Allergies (verified) Sulfamethoxazole-trimethoprim, Asa [aspirin], Buspirone, Levofloxacin, Lexapro [escitalopram oxalate], Motrin [ibuprofen], Pollen extract, Soy allergy, Ceftin [cefuroxime axetil], Penicillins, and Sulfa antibiotics  History: Past Medical History:  Diagnosis Date  . Allergy   . Anxiety   . Aspiration pneumonia (HCC)   . Depression   . Frequent headaches    H/O  . GERD (gastroesophageal reflux disease)   . History of chicken pox   . History of colon polyps   . Hx of migraines   . Multiple sclerosis (HCC)   . PONV (postoperative nausea and vomiting)    Past Surgical History:  Procedure Laterality Date  . BACK SURGERY    . CHOLECYSTECTOMY    . COLONOSCOPY WITH PROPOFOL N/A  05/18/2017   Procedure: COLONOSCOPY WITH PROPOFOL;  Surgeon: Scot Jun, MD;  Location[facility] END[facility]COPY;  Service: Endoscopy;  Laterality: N/A;  . FOOT SURGERY  2015  . GALLBLADDER SURGERY  2008  . HARDWARE REMOVAL Left 02/14/2016   Procedure: LEFT FOOT REMOVAL DEEP IMPLANT;  Surgeon: Toni Arthurs, MD;  Location: [facility];  Service: Orthopedics;  Laterality: Left;  . HERNIA REPAIR     Inguinal Hernia Repair  . SPINE SURGERY  2014   Family History  Problem Relation Age of Onset  . Arthritis Mother   . Hypertension Mother   . Macular degeneration Mother   . Hypertension Father   . Hyperlipidemia Father   . Heart disease Maternal Grandfather   . Diabetes Maternal Grandfather   . Kidney disease Paternal Grandmother    Social History   Socioeconomic History  . Marital status: Married    Spouse name: Not on file  . Number of children: Not on file  . Years of education: Not on file  . Highest education level: Not on file  Occupational History  . Not on file  Tobacco Use  . Smoking status: Never Smoker  . Smokeless tobacco: Never Used  Vaping Use  . Vaping Use: Never used  Substance and Sexual Activity  . Alcohol use: No    Alcohol/week: 0.0 standard drinks  . Drug use: No  . Sexual activity: Not Currently  Other Topics Concern  . Not on file  Social History Narrative   Married    Social Determinants of Health   Financial Resource Strain: Low Risk   . Difficulty of Paying Living Expenses: Not hard at all  Food Insecurity: No Food Insecurity  . Worried About Programme researcher, broadcasting/film/video in the Last Year: Never true  . Ran Out of Food in the Last Year: Never true  Transportation Needs: No Transportation Needs  . Lack of Transportation (Medical): No  . Lack of Transportation (Non-Medical): No  Physical Activity: Not on file  Stress: No Stress Concern Present  . Feeling of Stress : Not at all  Social Connections: Unknown  . Frequency of Communication with  Friends and Family: Not on file  . Frequency of Social Gatherings with Friends and Family: Not on file  . Attends Religious Services: Not on file  . Active Member of Clubs or Organizations: Not on file  . Attends Banker Meetings: Not on file  . Marital Status: Married    Tobacco Counseling Counseling given: Not Answered   Clinical Intake:  Pre-visit preparation completed: Yes        Diabetes: No  How often do you need to have someone help you when you read instructions, pamphlets, or other written materials from your doctor or pharmacy?: 1 - Never    Interpreter Needed?: No      Activities of Daily Living In your present state of health,  do you have any difficulty performing the following activities: 11/02/2020  Hearing? N  Vision? N  Difficulty concentrating or making decisions? N  Comment Hx memory change  Walking or climbing stairs? Y  Comment Walker in use  Dressing or bathing? N  Doing errands, shopping? Y  Comment She does not run errands alone  Quarry manager and eating ? N  Using the Toilet? N  In the past six months, have you accidently leaked urine? Y  Comment Managed with daily pad  Do you have problems with loss of bowel control? N  Managing your Medications? N  Managing your Finances? N  Housekeeping or managing your Housekeeping? N  Some recent data might be hidden    Patient Care Team: Dale Hemphill, MD as PCP - General (Internal Medicine) Christoper Allegra., MD as Referring Physician (Psychiatry)  Indicate any recent Medical Services you may have received from other than [facility] providers in the past year (date may be approximate).     Assessment:   This is a routine wellness examination for Carly [name].  I connected with Carly [name] today by telephone and verified that I am speaking with the correct person using two identifiers. Location patient: home Location provider: work Persons participating in the virtual visit: patient,  Engineer, civil (consulting).    I discussed the limitations, risks, security and privacy concerns of performing an evaluation and management service by telephone and the availability of in person appointments. The patient expressed understanding and verbally consented to this telephonic visit.    Interactive audio and video telecommunications were attempted between this provider and patient, however failed, due to patient having technical difficulties OR patient did not have access to video capability.  We continued and completed visit with audio only.  Some vital signs may be absent or patient reported.   Hearing/Vision screen  Hearing Screening   125Hz  250Hz  500Hz  1000Hz  2000Hz  3000Hz  4000Hz  6000Hz  8000Hz   Right ear:           Left ear:           Comments: Patient is able to hear conversational tones without difficulty.  No issues reported.  Vision Screening Comments: Followed by [facility] Wears corrective lenses Last OV 2017 Visual acuity not assessed per patient preference since they have regular follow up with the ophthalmologist  Dietary issues and exercise activities discussed: Current Exercise Habits: The patient does not participate in regular exercise at present  Goals    . Increase physical activity     Start swimming again for exercise      Depression Screen PHQ 2/9 Scores 11/02/2020 07/12/2019 03/14/2019 03/10/2017 07/09/2016  PHQ - 2 Score 0 0 0 2 0  PHQ- 9 Score - 0 0 2 -    Fall Risk Fall Risk  11/02/2020 09/28/2020 03/14/2019 03/10/2017 07/09/2016  Falls in the past year? 0 0 0 No No  Number falls in past yr: 0 - 0 - -  Injury with Fall? 0 - 0 - -  Follow up Falls evaluation completed Falls evaluation completed Falls evaluation completed - -    FALL RISK PREVENTION PERTAINING TO THE HOME: Handrails in use when climbing stairs?Yes Home free of loose throw rugs in walkways, pet beds, electrical cords, etc? Yes  Adequate lighting in your home to reduce risk of falls? Yes    ASSISTIVE DEVICES UTILIZED TO PREVENT FALLS: Life alert? No  Use of a cane, walker or w/c? Yes  Grab bars in the bathroom? Yes  Shower chair  or bench in shower? Yes  Elevated toilet seat or a handicapped toilet? No   TIMED UP AND GO: Was the test performed? No .  Virtual visit.   Cognitive Function: Declined.  Patient is alert and oriented x3.  MMSE - Mini Mental State Exam 03/10/2017  Orientation to time 5  Orientation to Place 5  Registration 3  Attention/ Calculation 5  Recall 3  Language- name 2 objects 2  Language- repeat 1  Language- follow 3 step command 3  Language- read & follow direction 1  Write a sentence 1  Copy design 1  Total score 30        Immunizations Immunization History  Administered Date(s) Administered  . Fluad Quad(high Dose 65+) 07/08/2019  . Influenza, High Dose Seasonal PF 06/17/2018  . Influenza-Unspecified 09/03/2012, 08/27/2014, 08/14/2015, 08/07/2020  . PFIZER(Purple Top)SARS-COV-2 Vaccination 11/11/2019, 12/02/2019, 07/20/2020  . Pneumococcal Conjugate-13 12/15/2016  . Pneumococcal Polysaccharide-23 01/28/2012  . Zoster Recombinat (Shingrix) 08/11/2019    TDAP status: Due, Education has been provided regarding the importance of this vaccine. Advised may receive this vaccine at local pharmacy or Health Dept. Aware to provide a copy of the vaccination record if obtained from local pharmacy or Health Dept. Verbalized acceptance and understanding. Deferred.   Health Maintenance Health Maintenance  Topic Date Due  . FOOT EXAM  Never done  . URINE MICROALBUMIN  Never done  . TETANUS/TDAP  11/02/2021 (Originally 07/15/1970)  . PNA vac Low Risk Adult (2 of 2 - PPSV23) 11/02/2021 (Originally 12/15/2017)  . COVID-19 Vaccine (4 - Booster for Pfizer series) 01/18/2021  . HEMOGLOBIN A1C  02/14/2021  . OPHTHALMOLOGY EXAM  03/27/2021  . MAMMOGRAM  08/09/2022  . COLONOSCOPY (Pts 45-62yrs Insurance coverage will need to be confirmed)  05/19/2027   . INFLUENZA VACCINE  Completed  . DEXA SCAN  Completed  . Hepatitis C Screening  Completed   Colorectal cancer screening: Type of screening: Colonoscopy. Completed 05/18/17. Repeat every 5 years  Mammogram status: Completed 08/09/20. Repeat every year  Bone Density status: Completed 06/10/16. Results reflect: Bone density results: OSTEOPENIA. Repeat every 2-5 years.  Lung Cancer Screening: (Low Dose CT Chest recommended if Age 67-80 years, 30 pack-year currently smoking OR have quit w/in 15years.) does not qualify.   Hepatitis C Screening: Completed 03/10/17.  Vision Screening: Recommended annual ophthalmology exams for early detection of glaucoma and other disorders of the eye. Is the patient up to date with their annual eye exam?  Yes  Who is the provider or what is the name of the office in which the patient attends annual eye exams?   Dental Screening: Recommended annual dental exams for proper oral hygiene  Community Resource Referral / Chronic Care Management: CRR required this visit?  No   CCM required this visit?  No      Plan:   Keep all routine maintenance appointments.   Follow up 11/28/20 @ 11:30  Patient notes feet are holding fluid today. Plans to take 1 lasix as discussed with pcp previously for PRN. Denies pitting edema.  patient notes feet are holding fluid today. Plans to take 1 lasix as discussed with pcp previously for PRN. Denies pitting edema.    I have personally reviewed and noted the following in the patient's chart:   . Medical and social history . Use of alcohol, tobacco or illicit drugs  . Current medications and supplements . Functional ability and status . Nutritional status . Physical activity . Advanced directives . List of other  physicians . Hospitalizations, surgeries, and ER visits in previous 12 months . Vitals . Screenings to include cognitive, depression, and falls . Referrals and appointments  In addition, I have reviewed and  discussed with patient certain preventive protocols, quality metrics, and best practice recommendations. A written personalized care plan for preventive services as well as general preventive health recommendations were provided to patient via mychart.     Varney Biles, LPN   8/00/3491   Reviewed above information.  Agree with assessment and plan.  Agree with prn lasix.  Will follow up if persistent problems.    Dr Nicki Reaper

## 2020-11-02 NOTE — Patient Instructions (Addendum)
[name] , Thank you for taking time to come for your Medicare Wellness Visit. I appreciate your ongoing commitment to your health goals. Please review the following plan we discussed and let me know if I can assist you in the future.   These are the goals we discussed: Goals    . Increase physical activity     Start swimming again for exercise       This is a list of the screening recommended for you and due dates:  Health Maintenance  Topic Date Due  . Complete foot exam   Never done  . Urine Protein Check  Never done  . Tetanus Vaccine  11/02/2021*  . Pneumonia vaccines (2 of 2 - PPSV23) 11/02/2021*  . COVID-19 Vaccine (4 - Booster for Pfizer series) 01/18/2021  . Hemoglobin A1C  02/14/2021  . Eye exam for diabetics  03/27/2021  . Mammogram  08/09/2022  . Colon Cancer Screening  05/19/2027  . Flu Shot  Completed  . DEXA scan (bone density measurement)  Completed  .  Hepatitis C: One time screening is recommended by Center for Disease Control  (CDC) for  adults born from [birth year] through 1966.   Completed  *Topic was postponed. The date shown is not the original due date.     Immunizations Immunization History  Administered Date(s) Administered  . Fluad Quad(high Dose 65+) 07/08/2019  . Influenza, High Dose Seasonal PF 06/17/2018  . Influenza-Unspecified 09/03/2012, 08/27/2014, 08/14/2015, 08/07/2020  . PFIZER(Purple Top)SARS-COV-2 Vaccination 11/11/2019, 12/02/2019, 07/20/2020  . Pneumococcal Conjugate-13 12/15/2016  . Pneumococcal Polysaccharide-23 01/28/2012  . Zoster Recombinat (Shingrix) 08/11/2019   Keep all routine maintenance appointments.   Follow up 11/28/20 @ 11:30  Advanced directives: on file  Conditions/risks identified: patient notes feet are holding fluid today. Plans to take 1 lasix as discussed with pcp previously for PRN. Denies pitting edema.   Follow up in one year for your annual wellness visit    Preventive Care 65 Years and Older,  Female Preventive care refers to lifestyle choices and visits with your health care provider that can promote health and wellness. What does preventive care include?  A yearly physical exam. This is also called an annual well check.  Dental exams once or twice a year.  Routine eye exams. Ask your health care provider how often you should have your eyes checked.  Personal lifestyle choices, including:  Daily care of your teeth and gums.  Regular physical activity.  Eating a healthy diet.  Avoiding tobacco and drug use.  Limiting alcohol use.  Practicing safe sex.  Taking low-dose aspirin every day.  Taking vitamin and mineral supplements as recommended by your health care provider. What happens during an annual well check? The services and screenings done by your health care provider during your annual well check will depend on your age, overall health, lifestyle risk factors, and family history of disease. Counseling  Your health care provider may ask you questions about your:  Alcohol use.  Tobacco use.  Drug use.  Emotional well-being.  Home and relationship well-being.  Sexual activity.  Eating habits.  History of falls.  Memory and ability to understand (cognition).  Work and work Statistician.  Reproductive health. Screening  You may have the following tests or measurements:  Height, weight, and BMI.  Blood pressure.  Lipid and cholesterol levels. These may be checked every 5 years, or more frequently if you are over 48 years old.  Skin check.  Lung cancer screening. You  may have this screening every year starting at age 85 if you have a 30-pack-year history of smoking and currently smoke or have quit within the past 15 years.  Fecal occult blood test (FOBT) of the stool. You may have this test every year starting at age 97.  Flexible sigmoidoscopy or colonoscopy. You may have a sigmoidoscopy every 5 years or a colonoscopy every 10 years  starting at age 34.  Hepatitis C blood test.  Hepatitis B blood test.  Sexually transmitted disease (STD) testing.  Diabetes screening. This is done by checking your blood sugar (glucose) after you have not eaten for a while (fasting). You may have this done every 1-3 years.  Bone density scan. This is done to screen for osteoporosis. You may have this done starting at age 38.  Mammogram. This may be done every 1-2 years. Talk to your health care provider about how often you should have regular mammograms. Talk with your health care provider about your test results, treatment options, and if necessary, the need for more tests. Vaccines  Your health care provider may recommend certain vaccines, such as:  Influenza vaccine. This is recommended every year.  Tetanus, diphtheria, and acellular pertussis (Tdap, Td) vaccine. You may need a Td booster every 10 years.  Zoster vaccine. You may need this after age 20.  Pneumococcal 13-valent conjugate (PCV13) vaccine. One dose is recommended after age 60.  Pneumococcal polysaccharide (PPSV23) vaccine. One dose is recommended after age 34. Talk to your health care provider about which screenings and vaccines you need and how often you need them. This information is not intended to replace advice given to you by your health care provider. Make sure you discuss any questions you have with your health care provider. Document Released: 10/26/2015 Document Revised: 06/18/2016 Document Reviewed: 07/31/2015 Elsevier Interactive Patient Education  2017 [place] Prevention in the Home Falls can cause injuries. They can happen to people of all ages. There are many things you can do to make your home safe and to help prevent falls. What can I do on the outside of my home?  Regularly fix the edges of walkways and driveways and fix any cracks.  Remove anything that might make you trip as you walk through a door, such as a raised step or  threshold.  Trim any bushes or trees on the path to your home.  Use bright outdoor lighting.  Clear any walking paths of anything that might make someone trip, such as rocks or tools.  Regularly check to see if handrails are loose or broken. Make sure that both sides of any steps have handrails.  Any raised decks and porches should have guardrails on the edges.  Have any leaves, snow, or ice cleared regularly.  Use sand or salt on walking paths during winter.  Clean up any spills in your garage right away. This includes oil or grease spills. What can I do in the bathroom?  Use night lights.  Install grab bars by the toilet and in the tub and shower. Do not use towel bars as grab bars.  Use non-skid mats or decals in the tub or shower.  If you need to sit down in the shower, use a plastic, non-slip stool.  Keep the floor dry. Clean up any water that spills on the floor as soon as it happens.  Remove soap buildup in the tub or shower regularly.  Attach bath mats securely with double-sided non-slip rug tape.  Do  not have throw rugs and other things on the floor that can make you trip. What can I do in the bedroom?  Use night lights.  Make sure that you have a light by your bed that is easy to reach.  Do not use any sheets or blankets that are too big for your bed. They should not hang down onto the floor.  Have a firm chair that has side arms. You can use this for support while you get dressed.  Do not have throw rugs and other things on the floor that can make you trip. What can I do in the kitchen?  Clean up any spills right away.  Avoid walking on wet floors.  Keep items that you use a lot in easy-to-reach places.  If you need to reach something above you, use a strong step stool that has a grab bar.  Keep electrical cords out of the way.  Do not use floor polish or wax that makes floors slippery. If you must use wax, use non-skid floor wax.  Do not have  throw rugs and other things on the floor that can make you trip. What can I do with my stairs?  Do not leave any items on the stairs.  Make sure that there are handrails on both sides of the stairs and use them. Fix handrails that are broken or loose. Make sure that handrails are as long as the stairways.  Check any carpeting to make sure that it is firmly attached to the stairs. Fix any carpet that is loose or worn.  Avoid having throw rugs at the top or bottom of the stairs. If you do have throw rugs, attach them to the floor with carpet tape.  Make sure that you have a light switch at the top of the stairs and the bottom of the stairs. If you do not have them, ask someone to add them for you. What else can I do to help prevent falls?  Wear shoes that:  Do not have high heels.  Have rubber bottoms.  Are comfortable and fit you well.  Are closed at the toe. Do not wear sandals.  If you use a stepladder:  Make sure that it is fully opened. Do not climb a closed stepladder.  Make sure that both sides of the stepladder are locked into place.  Ask someone to hold it for you, if possible.  Clearly mark and make sure that you can see:  Any grab bars or handrails.  First and last steps.  Where the edge of each step is.  Use tools that help you move around (mobility aids) if they are needed. These include:  Canes.  Walkers.  Scooters.  Crutches.  Turn on the lights when you go into a dark area. Replace any light bulbs as soon as they burn out.  Set up your furniture so you have a clear path. Avoid moving your furniture around.  If any of your floors are uneven, fix them.  If there are any pets around you, be aware of where they are.  Review your medicines with your doctor. Some medicines can make you feel dizzy. This can increase your chance of falling. Ask your doctor what other things that you can do to help prevent falls. This information is not intended to  replace advice given to you by your health care provider. Make sure you discuss any questions you have with your health care provider. Document Released: 07/26/2009 Document Revised: 03/06/2016  Document Reviewed: 11/03/2014 Elsevier Interactive Patient Education  2017 [employer].

## 2020-11-05 DIAGNOSIS — M5413 Radiculopathy, cervicothoracic region: Secondary | ICD-10-CM | POA: Diagnosis not present

## 2020-11-05 DIAGNOSIS — M542 Cervicalgia: Secondary | ICD-10-CM | POA: Diagnosis not present

## 2020-11-05 DIAGNOSIS — M7918 Myalgia, other site: Secondary | ICD-10-CM | POA: Diagnosis not present

## 2020-11-05 DIAGNOSIS — M5412 Radiculopathy, cervical region: Secondary | ICD-10-CM | POA: Diagnosis not present

## 2020-11-05 DIAGNOSIS — M9900 Segmental and somatic dysfunction of head region: Secondary | ICD-10-CM | POA: Diagnosis not present

## 2020-11-05 DIAGNOSIS — R519 Headache, unspecified: Secondary | ICD-10-CM | POA: Diagnosis not present

## 2020-11-05 DIAGNOSIS — M9901 Segmental and somatic dysfunction of cervical region: Secondary | ICD-10-CM | POA: Diagnosis not present

## 2020-11-13 DIAGNOSIS — M9900 Segmental and somatic dysfunction of head region: Secondary | ICD-10-CM | POA: Diagnosis not present

## 2020-11-13 DIAGNOSIS — R519 Headache, unspecified: Secondary | ICD-10-CM | POA: Diagnosis not present

## 2020-11-13 DIAGNOSIS — M5413 Radiculopathy, cervicothoracic region: Secondary | ICD-10-CM | POA: Diagnosis not present

## 2020-11-13 DIAGNOSIS — M542 Cervicalgia: Secondary | ICD-10-CM | POA: Diagnosis not present

## 2020-11-13 DIAGNOSIS — M5412 Radiculopathy, cervical region: Secondary | ICD-10-CM | POA: Diagnosis not present

## 2020-11-13 DIAGNOSIS — M9901 Segmental and somatic dysfunction of cervical region: Secondary | ICD-10-CM | POA: Diagnosis not present

## 2020-11-13 DIAGNOSIS — M7918 Myalgia, other site: Secondary | ICD-10-CM | POA: Diagnosis not present

## 2020-11-15 ENCOUNTER — Encounter: Payer: BC Managed Care – PPO | Admitting: Dermatology

## 2020-11-28 ENCOUNTER — Telehealth (INDEPENDENT_AMBULATORY_CARE_PROVIDER_SITE_OTHER): Payer: Medicare Other | Admitting: Internal Medicine

## 2020-11-28 DIAGNOSIS — E7800 Pure hypercholesterolemia, unspecified: Secondary | ICD-10-CM | POA: Diagnosis not present

## 2020-11-28 DIAGNOSIS — R739 Hyperglycemia, unspecified: Secondary | ICD-10-CM | POA: Diagnosis not present

## 2020-11-28 DIAGNOSIS — R059 Cough, unspecified: Secondary | ICD-10-CM

## 2020-11-28 DIAGNOSIS — R600 Localized edema: Secondary | ICD-10-CM

## 2020-11-28 DIAGNOSIS — G35 Multiple sclerosis: Secondary | ICD-10-CM

## 2020-11-28 DIAGNOSIS — K5900 Constipation, unspecified: Secondary | ICD-10-CM | POA: Diagnosis not present

## 2020-11-28 DIAGNOSIS — F339 Major depressive disorder, recurrent, unspecified: Secondary | ICD-10-CM

## 2020-11-28 MED ORDER — AZELASTINE HCL 0.1 % NA SOLN: 1.0000 | Freq: Two times a day (BID) | NASAL | 1 refills | Status: DC

## 2020-11-28 NOTE — Progress Notes (Signed)
Patient ID: Carly [name], female   DOB: 10/11/1951, 70 y.o.   MRN: [id number]   Virtual Visit via video Note  This visit type was conducted due to national recommendations for restrictions regarding the COVID-19 pandemic (e.g. social distancing).  This format is felt to be most appropriate for this patient at this time.  All issues noted in this document were discussed and addressed.  No physical exam was performed (except for noted visual exam findings with Video Visits).   I connected withPaulette Rung by a video enabled telemedicine application and verified that I am speaking with the correct person using two identifiers. Location patient: home Location provider: work  Persons participating in the virtual visit: patient, provider  The limitations, risks, security and privacy concerns of performing an evaluation and management service by video and the availability of in person appointments have been discussed.  It has also been discussed with the patient that there may be a patient responsible charge related to this service. The patient expressed understanding and agreed to proceed.   Reason for visit: scheduled follow up.   HPI: Recently evaluated for stomach cramping and pelvic pain.  Also evaluated recently for cough and congestion.  Cough is better.  Was taking tussin DM.  Using humidifier.  No fever.  Still with some sinus congestion.  Drainage.  No chest congestion.  Some acid reflux.  No nausea or vomiting.  No diarrhea.  No abdominal pain or cramping now.  Bowels ok now.  Has not been wearing compression hose as regular recently.  Husband had surgery and has been unable to put on.  Plans to restart.  Swelling is better.  Had been using allegra D.  Felt dried her out too much.  Overall feels better.     ROS: See pertinent positives and negatives per HPI.  Past Medical History:  Diagnosis Date  . Allergy   . Anxiety   . Aspiration pneumonia ([place])   . Depression   .  Frequent headaches    H/O  . GERD (gastroesophageal reflux disease)   . History of chicken pox   . History of colon polyps   . Hx of migraines   . Multiple sclerosis ([place])   . PONV (postoperative nausea and vomiting)     Past Surgical History:  Procedure Laterality Date  . BACK SURGERY    . CHOLECYSTECTOMY    . COLONOSCOPY WITH PROPOFOL N/A 05/18/2017   Procedure: COLONOSCOPY WITH PROPOFOL;  Surgeon: [name], MD;  Location[facility] ENDOSCOPY;  Service: Endoscopy;  Laterality: N/A;  . FOOT SURGERY  2015  . GALLBLADDER SURGERY  2008  . HARDWARE REMOVAL Left 02/14/2016   Procedure: LEFT FOOT REMOVAL DEEP IMPLANT;  Surgeon: [name], MD;  Location: [facility];  Service: Orthopedics;  Laterality: Left;  . HERNIA REPAIR     Inguinal Hernia Repair  . SPINE SURGERY  2014    Family History  Problem Relation Age of Onset  . Arthritis Mother   . Hypertension Mother   . Macular degeneration Mother   . Hypertension Father   . Hyperlipidemia Father   . Heart disease Maternal Grandfather   . Diabetes Maternal Grandfather   . Kidney disease Paternal Grandmother     SOCIAL HX: reviewed.    Current Outpatient Medications:  .  azelastine (ASTELIN) 0.1 % nasal spray, Place 1 spray into both nostrils 2 (two) times daily. Use in each nostril as directed, Disp: 30 mL, Rfl: 1 .  acetaminophen (TYLENOL) 325 MG tablet, Take 650 mg by mouth every 4 (four) hours as needed., Disp: , Rfl:  .  B Complex Vitamins (VITAMIN B COMPLEX PO), Take by mouth., Disp: , Rfl:  .  Calcium-Phosphorus-Vitamin D (CITRACAL +D3 PO), Take by mouth. 2 tablets twice a day, Disp: , Rfl:  .  Cholecalciferol (D3-1000 PO), Take by mouth., Disp: , Rfl:  .  Coenzyme Q10 (CO Q10) 100 MG CAPS, Take by mouth., Disp: , Rfl:  .  dalfampridine 10 MG TB12, Take 10 mg by mouth every 12 (twelve) hours., Disp: , Rfl:  .  DULoxetine (CYMBALTA) 60 MG capsule, Take 60 mg by mouth daily., Disp: , Rfl:  .  fexofenadine  (ALLEGRA ALLERGY) 180 MG tablet, Take 1 tablet (180 mg total) by mouth daily., Disp: 30 tablet, Rfl: 2 .  fluticasone (FLONASE) 50 MCG/ACT nasal spray, Place 1 spray into both nostrils daily., Disp: 16 g, Rfl: 2 .  hydrocortisone (ANUSOL-HC) 25 MG suppository, Place 1 suppository (25 mg total) rectally 2 (two) times daily., Disp: 12 suppository, Rfl: 0 .  hydrocortisone 2.5 % cream, Apply topically 2 (two) times daily as needed (Rash)., Disp: 30 g, Rfl: 11 .  L-Methylfolate-Algae (DEPLIN 15 PO), Take 15 mg by mouth 1 day or 1 dose., Disp: , Rfl:  .  LORazepam (ATIVAN) 0.5 MG tablet, Take 0.5 mg by mouth at bedtime., Disp: , Rfl:  .  Magnesium Oxide 400 (240 Mg) MG TABS, TAKE ONE TABLET EVERY DAY, Disp: 30 tablet, Rfl: 1 .  mirtazapine (REMERON) 30 MG tablet, Take 30 mg by mouth daily., Disp: , Rfl:  .  ondansetron (ZOFRAN ODT) 4 MG disintegrating tablet, Take 1 tablet (4 mg total) by mouth every 8 (eight) hours as needed for nausea or vomiting., Disp: 30 tablet, Rfl: 0 .  oxazepam (SERAX) 10 MG capsule, Take 10 mg by mouth at bedtime as needed for sleep or anxiety., Disp: , Rfl:  .  sodium chloride (V-R NASAL SPRAY SALINE) 0.65 % nasal spray, Place into the nose., Disp: , Rfl:  .  tiZANidine (ZANAFLEX) 4 MG tablet, Take 1 tablet by mouth 3 (three) times daily as needed., Disp: , Rfl:  .  traZODone (DESYREL) 50 MG tablet, Take 50 mg by mouth at bedtime. , Disp: , Rfl: 12 No current facility-administered medications for this visit.  Facility-Administered Medications Ordered in Other Visits:  .  0.9 %  sodium chloride infusion, , Intravenous, Continuous, Corcoran, Melissa C, MD, Last Rate: 10 mL/hr at 04/25/16 0855, New Bag at 04/25/16 0855 .  acetaminophen (TYLENOL) tablet 650 mg, 650 mg, Oral, Once, Corcoran, Melissa C, MD  EXAM:  GENERAL: alert, oriented, appears well and in no acute distress  HEENT: atraumatic, conjunttiva clear, no obvious abnormalities on inspection of external nose and  ears  NECK: normal movements of the head and neck  LUNGS: on inspection no signs of respiratory distress, breathing rate appears normal, no obvious gross SOB, gasping or wheezing  CV: no obvious cyanosis  PSYCH/NEURO: pleasant and cooperative, no obvious depression or anxiety, speech and thought processing grossly intact  ASSESSMENT AND PLAN:  Discussed the following assessment and plan:  Problem List Items Addressed This Visit    Constipation    Bowels doing well now.  No abdominal pain.  Follow.       Cough    No chest congestion.  No increased sob.  Increased drainage.  Feel contributing to cough.  Continue flonase.  Trial of astelin nasal  spray.  Can continue tussin DM.  Treat acid reflux.       Depression, recurrent ([place])    Followed by psychiatry.  Stable.  Continues on cymbalta and remeron.       Hypercholesterolemia    Has wanted to hold cholesterol medication.  Low cholesterol diet and exercise.  Follow lipid panel.       Hyperglycemia    Low carb diet and exercise.  Follow met b and a1c.       Lower extremity edema    Overall improved.  Not taking lasix regularly. Has to use prn.  Continue compression hose. Plans to restart as outlined.        Multiple sclerosis ([place])    Has been on dalfampridine and tizanidine.  Stable.  Continue f/u with neurology.            I discussed the assessment and treatment plan with the patient. The patient was provided an opportunity to ask questions and all were answered. The patient agreed with the plan and demonstrated an understanding of the instructions.   The patient was advised to call back or seek an in-person evaluation if the symptoms worsen or if the condition fails to improve as anticipated.    Einar Pheasant, MD

## 2020-12-02 ENCOUNTER — Encounter: Payer: Self-pay | Admitting: Internal Medicine

## 2020-12-02 NOTE — Assessment & Plan Note (Signed)
Followed by psychiatry.  Stable.  Continues on cymbalta and remeron.

## 2020-12-02 NOTE — Assessment & Plan Note (Signed)
Has been on dalfampridine and tizanidine.  Stable.  Continue f/u with neurology.

## 2020-12-02 NOTE — Assessment & Plan Note (Signed)
Bowels doing well now.  No abdominal pain.  Follow.

## 2020-12-02 NOTE — Assessment & Plan Note (Signed)
Overall improved.  Not taking lasix regularly. Has to use prn.  Continue compression hose. Plans to restart as outlined.

## 2020-12-02 NOTE — Assessment & Plan Note (Signed)
No chest congestion.  No increased sob.  Increased drainage.  Feel contributing to cough.  Continue flonase.  Trial of astelin nasal spray.  Can continue tussin DM.  Treat acid reflux.

## 2020-12-02 NOTE — Assessment & Plan Note (Signed)
Low carb diet and exercise.  Follow met b and a1c.  

## 2020-12-02 NOTE — Assessment & Plan Note (Signed)
Has wanted to hold cholesterol medication.  Low cholesterol diet and exercise.  Follow lipid panel.

## 2020-12-05 DIAGNOSIS — M5413 Radiculopathy, cervicothoracic region: Secondary | ICD-10-CM | POA: Diagnosis not present

## 2020-12-05 DIAGNOSIS — M9901 Segmental and somatic dysfunction of cervical region: Secondary | ICD-10-CM | POA: Diagnosis not present

## 2020-12-05 DIAGNOSIS — M9900 Segmental and somatic dysfunction of head region: Secondary | ICD-10-CM | POA: Diagnosis not present

## 2020-12-05 DIAGNOSIS — M7918 Myalgia, other site: Secondary | ICD-10-CM | POA: Diagnosis not present

## 2020-12-05 DIAGNOSIS — R519 Headache, unspecified: Secondary | ICD-10-CM | POA: Diagnosis not present

## 2020-12-05 DIAGNOSIS — M5412 Radiculopathy, cervical region: Secondary | ICD-10-CM | POA: Diagnosis not present

## 2020-12-05 DIAGNOSIS — M542 Cervicalgia: Secondary | ICD-10-CM | POA: Diagnosis not present

## 2020-12-08 ENCOUNTER — Emergency Department: Payer: Medicare Other

## 2020-12-08 ENCOUNTER — Encounter: Payer: Self-pay | Admitting: Emergency Medicine

## 2020-12-08 ENCOUNTER — Inpatient Hospital Stay
Admission: EM | Admit: 2020-12-08 | Discharge: 2020-12-13 | DRG: 066 | Disposition: A | Discharge status: Rehabilitation Facility | Payer: Medicare Other | Attending: Internal Medicine | Admitting: Internal Medicine

## 2020-12-08 ENCOUNTER — Other Ambulatory Visit: Payer: Self-pay

## 2020-12-08 DIAGNOSIS — R000 Tachycardia, unspecified: Secondary | ICD-10-CM | POA: Diagnosis not present

## 2020-12-08 DIAGNOSIS — K5900 Constipation, unspecified: Secondary | ICD-10-CM | POA: Diagnosis present

## 2020-12-08 DIAGNOSIS — G8314 Monoplegia of lower limb affecting left nondominant side: Secondary | ICD-10-CM | POA: Diagnosis not present

## 2020-12-08 DIAGNOSIS — I6389 Other cerebral infarction: Secondary | ICD-10-CM | POA: Diagnosis present

## 2020-12-08 DIAGNOSIS — G35 Multiple sclerosis: Secondary | ICD-10-CM | POA: Diagnosis not present

## 2020-12-08 DIAGNOSIS — F411 Generalized anxiety disorder: Secondary | ICD-10-CM | POA: Diagnosis not present

## 2020-12-08 DIAGNOSIS — K219 Gastro-esophageal reflux disease without esophagitis: Secondary | ICD-10-CM | POA: Diagnosis present

## 2020-12-08 DIAGNOSIS — Z6831 Body mass index (BMI) 31.0-31.9, adult: Secondary | ICD-10-CM

## 2020-12-08 DIAGNOSIS — R29818 Other symptoms and signs involving the nervous system: Secondary | ICD-10-CM | POA: Diagnosis present

## 2020-12-08 DIAGNOSIS — I10 Essential (primary) hypertension: Secondary | ICD-10-CM | POA: Diagnosis not present

## 2020-12-08 DIAGNOSIS — G8929 Other chronic pain: Secondary | ICD-10-CM | POA: Diagnosis not present

## 2020-12-08 DIAGNOSIS — Z886 Allergy status to analgesic agent status: Secondary | ICD-10-CM | POA: Diagnosis not present

## 2020-12-08 DIAGNOSIS — F418 Other specified anxiety disorders: Secondary | ICD-10-CM | POA: Diagnosis present

## 2020-12-08 DIAGNOSIS — Z20822 Contact with and (suspected) exposure to covid-19: Secondary | ICD-10-CM | POA: Diagnosis present

## 2020-12-08 DIAGNOSIS — E669 Obesity, unspecified: Secondary | ICD-10-CM | POA: Diagnosis present

## 2020-12-08 DIAGNOSIS — I5030 Unspecified diastolic (congestive) heart failure: Secondary | ICD-10-CM | POA: Diagnosis not present

## 2020-12-08 DIAGNOSIS — I69351 Hemiplegia and hemiparesis following cerebral infarction affecting right dominant side: Secondary | ICD-10-CM | POA: Diagnosis not present

## 2020-12-08 DIAGNOSIS — E785 Hyperlipidemia, unspecified: Secondary | ICD-10-CM | POA: Diagnosis not present

## 2020-12-08 DIAGNOSIS — I639 Cerebral infarction, unspecified: Secondary | ICD-10-CM | POA: Diagnosis not present

## 2020-12-08 DIAGNOSIS — R531 Weakness: Secondary | ICD-10-CM | POA: Diagnosis not present

## 2020-12-08 DIAGNOSIS — R29898 Other symptoms and signs involving the musculoskeletal system: Secondary | ICD-10-CM

## 2020-12-08 DIAGNOSIS — R9431 Abnormal electrocardiogram [ECG] [EKG]: Secondary | ICD-10-CM | POA: Diagnosis not present

## 2020-12-08 DIAGNOSIS — Z981 Arthrodesis status: Secondary | ICD-10-CM

## 2020-12-08 DIAGNOSIS — R9082 White matter disease, unspecified: Secondary | ICD-10-CM | POA: Diagnosis not present

## 2020-12-08 DIAGNOSIS — F419 Anxiety disorder, unspecified: Secondary | ICD-10-CM | POA: Diagnosis present

## 2020-12-08 DIAGNOSIS — R29700 NIHSS score 0: Secondary | ICD-10-CM | POA: Diagnosis present

## 2020-12-08 DIAGNOSIS — I6782 Cerebral ischemia: Secondary | ICD-10-CM | POA: Diagnosis not present

## 2020-12-08 DIAGNOSIS — G319 Degenerative disease of nervous system, unspecified: Secondary | ICD-10-CM | POA: Diagnosis not present

## 2020-12-08 LAB — CBC WITH DIFFERENTIAL/PLATELET
Abs Immature Granulocytes: 0.02 10*3/uL (ref 0.00–0.07)
Basophils Absolute: 0.1 10*3/uL (ref 0.0–0.1)
Basophils Relative: 1 %
Eosinophils Absolute: 0.1 10*3/uL (ref 0.0–0.5)
Eosinophils Relative: 1 %
HCT: 41.3 % (ref 36.0–46.0)
Hemoglobin: 13.7 g/dL (ref 12.0–15.0)
Immature Granulocytes: 0 %
Lymphocytes Relative: 18 %
Lymphs Abs: 1.7 10*3/uL (ref 0.7–4.0)
MCH: 28.1 pg (ref 26.0–34.0)
MCHC: 33.2 g/dL (ref 30.0–36.0)
MCV: 84.8 fL (ref 80.0–100.0)
Monocytes Absolute: 0.4 10*3/uL (ref 0.1–1.0)
Monocytes Relative: 5 %
Neutro Abs: 7.1 10*3/uL (ref 1.7–7.7)
Neutrophils Relative %: 75 %
Platelets: 299 10*3/uL (ref 150–400)
RBC: 4.87 MIL/uL (ref 3.87–5.11)
RDW: 14.6 % (ref 11.5–15.5)
WBC: 9.3 10*3/uL (ref 4.0–10.5)
nRBC: 0 % (ref 0.0–0.2)

## 2020-12-08 LAB — COMPREHENSIVE METABOLIC PANEL
ALT: 26 U/L (ref 0–44)
AST: 29 U/L (ref 15–41)
Albumin: 4.4 g/dL (ref 3.5–5.0)
Alkaline Phosphatase: 100 U/L (ref 38–126)
Anion gap: 11 (ref 5–15)
BUN: 11 mg/dL (ref 8–23)
CO2: 23 mmol/L (ref 22–32)
Calcium: 9.3 mg/dL (ref 8.9–10.3)
Chloride: 104 mmol/L (ref 98–111)
Creatinine, Ser: 0.69 mg/dL (ref 0.44–1.00)
GFR, Estimated: >60 mL/min (ref >60)
Glucose, Bld: 149 mg/dL — ABNORMAL HIGH (ref 70–99)
Potassium: 3.8 mmol/L (ref 3.5–5.1)
Sodium: 138 mmol/L (ref 135–145)
Total Bilirubin: 0.7 mg/dL (ref 0.3–1.2)
Total Protein: 7.4 g/dL (ref 6.5–8.1)

## 2020-12-08 LAB — ETHANOL: Alcohol, Ethyl (B): <10 mg/dL (ref <10)

## 2020-12-08 LAB — TROPONIN I (HIGH SENSITIVITY): Troponin I (High Sensitivity): 5 ng/L (ref <18)

## 2020-12-08 LAB — RESP PANEL BY RT-PCR (FLU A&B, COVID) ARPGX2
Influenza A by PCR: NEGATIVE
Influenza B by PCR: NEGATIVE
SARS Coronavirus 2 by RT PCR: NEGATIVE

## 2020-12-08 LAB — CBG MONITORING, ED: Glucose-Capillary: 116 mg/dL — ABNORMAL HIGH (ref 70–99)

## 2020-12-08 MED ORDER — ACETAMINOPHEN 650 MG RE SUPP: 650.0000 mg | RECTAL | Status: DC | PRN

## 2020-12-08 MED ORDER — MIRTAZAPINE 15 MG PO TABS
30.0000 mg | ORAL_TABLET | Freq: Every day | ORAL | Status: DC
  Administered 2020-12-09 – 2020-12-10 (×3): 30 mg via ORAL
  Filled 2020-12-08 (×3): qty 2

## 2020-12-08 MED ORDER — SENNOSIDES-DOCUSATE SODIUM 8.6-50 MG PO TABS: 1.0000 | ORAL_TABLET | Freq: Every evening | ORAL | Status: DC | PRN

## 2020-12-08 MED ORDER — STROKE: EARLY STAGES OF RECOVERY BOOK: Freq: Once | Status: AC

## 2020-12-08 MED ORDER — DALFAMPRIDINE ER 10 MG PO TB12
10.0000 mg | ORAL_TABLET | Freq: Two times a day (BID) | ORAL | Status: DC
  Administered 2020-12-10 – 2020-12-13 (×6): 10 mg via ORAL
  Filled 2020-12-08 (×12): qty 1

## 2020-12-08 MED ORDER — ACETAMINOPHEN 325 MG PO TABS
650.0000 mg | ORAL_TABLET | ORAL | Status: DC | PRN
  Administered 2020-12-09 – 2020-12-12 (×8): 650 mg via ORAL
  Filled 2020-12-08 (×8): qty 2

## 2020-12-08 MED ORDER — ACETAMINOPHEN 160 MG/5ML PO SOLN
650.0000 mg | ORAL | Status: DC | PRN
  Filled 2020-12-08: qty 20.3

## 2020-12-08 MED ORDER — DULOXETINE HCL 30 MG PO CPEP
60.0000 mg | ORAL_CAPSULE | Freq: Every day | ORAL | Status: DC
  Administered 2020-12-09 – 2020-12-13 (×5): 60 mg via ORAL
  Filled 2020-12-08 (×5): qty 2

## 2020-12-08 MED ORDER — ENOXAPARIN SODIUM 40 MG/0.4ML ~~LOC~~ SOLN
0.5000 mg/kg | SUBCUTANEOUS | Status: DC
  Administered 2020-12-09 – 2020-12-12 (×4): 42.5 mg via SUBCUTANEOUS
  Filled 2020-12-08 (×4): qty 0.8

## 2020-12-08 MED ORDER — LORAZEPAM 0.5 MG PO TABS: 0.2500 mg | ORAL_TABLET | Freq: Every day | ORAL | Status: DC

## 2020-12-08 MED ORDER — FLUTICASONE PROPIONATE 50 MCG/ACT NA SUSP
1.0000 | Freq: Every day | NASAL | Status: DC
  Administered 2020-12-09 – 2020-12-13 (×6): 1 via NASAL
  Filled 2020-12-08: qty 16

## 2020-12-08 MED ORDER — LORAZEPAM 0.5 MG PO TABS: 0.5000 mg | ORAL_TABLET | Freq: Every day | ORAL | Status: DC

## 2020-12-08 NOTE — ED Provider Notes (Signed)
[facility] Emergency Department Provider Note    Event Date/Time   First MD Initiated Contact with Patient 12/08/20 1956     (approximate)  I have reviewed the triage vital signs and the nursing notes.   HISTORY  Chief Complaint Weakness    HPI Carly [name] is a 70 y.o. female below listed past medical history presents to the ER for evaluation of what initially started as an episode of generalized weakness last night where she felt dehydrated drink some fluid and was otherwise feeling better and then this morning around noon after she was exercising started having weakness bilateral extremities were primarily on the right where she is typically right-hand-dominant but was having trouble picking things up.  Felt so weak that she was about to pass out denying chest pain.  No abdominal pain.  Still having weakness of the right hand has improved.  No slurred speech.  Denies any neck pain.    Past Medical History:  Diagnosis Date  . Allergy   . Anxiety   . Aspiration pneumonia ([place])   . Depression   . Frequent headaches    H/O  . GERD (gastroesophageal reflux disease)   . History of chicken pox   . History of colon polyps   . Hx of migraines   . Multiple sclerosis ([place])   . PONV (postoperative nausea and vomiting)    Family History  Problem Relation Age of Onset  . Arthritis Mother   . Hypertension Mother   . Macular degeneration Mother   . Hypertension Father   . Hyperlipidemia Father   . Heart disease Maternal Grandfather   . Diabetes Maternal Grandfather   . Kidney disease Paternal Grandmother    Past Surgical History:  Procedure Laterality Date  . BACK SURGERY    . CHOLECYSTECTOMY    . COLONOSCOPY WITH PROPOFOL N/A 05/18/2017   Procedure: COLONOSCOPY WITH PROPOFOL;  Surgeon: [name], MD;  Location: [facility] ENDOSCOPY;  Service: Endoscopy;  Laterality: N/A;  . FOOT SURGERY  2015  . GALLBLADDER SURGERY  2008  . HARDWARE REMOVAL  Left 02/14/2016   Procedure: LEFT FOOT REMOVAL DEEP IMPLANT;  Surgeon: [name], MD;  Location: [facility];  Service: Orthopedics;  Laterality: Left;  . HERNIA REPAIR     Inguinal Hernia Repair  . [place] SURGERY  2014   Patient Active Problem List   Diagnosis Date Noted  . Acute focal neurological deficit 12/08/2020  . Edema 09/28/2020  . Bloating 08/25/2020  . Environmental allergies 07/17/2019  . Hyperglycemia 07/17/2019  . Cough 06/06/2019  . Memory change 05/29/2019  . Abnormal liver function tests 01/09/2019  . Hemorrhoid 09/27/2018  . Nausea 08/11/2018  . 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 Depression with anxiety 02/04/2018  . Cognitive and behavioral changes 02/04/2018  . Constipation 11/17/2017  . Hx of adenomatous colonic polyps 06/06/2016  . Lower extremity edema 01/13/2016  . Bilateral ovarian cysts 10/16/2015  . Health care maintenance 10/16/2015  . Colon polyp 01/25/2015  . Bone/cartilage disorder 01/25/2015  . Headache 11/18/2014  . Depression, recurrent ([place]) 11/18/2014  . Greater tuberosity of humerus fracture 11/18/2014  . Multiple sclerosis ([place]) 11/18/2014  . Unsteady gait 11/18/2014  . Dizziness 11/18/2014  . Inconclusive mammogram 03/15/2013  . S/P lumbar spinal fusion 01/04/2013  . Surgery, other elective 12/03/2012  . DDD (degenerative disc disease), lumbosacral 10/15/2012  . Back ache 09/06/2012  . BP (high blood pressure) 09/06/2012  . Back pain 09/06/2012  .  Hypercholesterolemia 01/28/2012  . Climacteric 01/28/2012  . Routine general medical examination at a health care facility 01/28/2012  . Avitaminosis D 01/28/2012      Prior to Admission medications   Medication Sig Start Date End Date Taking? Authorizing Provider  acetaminophen (TYLENOL) 325 MG tablet Take 650 mg by mouth every 4 (four) hours as needed.    [provider]  azelastine (ASTELIN) 0.1 % nasal spray Place 1 spray into both nostrils 2 (two) times  daily. Use in each nostril as directed 11/28/20   Einar Pheasant, MD  B Complex Vitamins (VITAMIN B COMPLEX PO) Take by mouth.    [provider]  Calcium-Phosphorus-Vitamin D (CITRACAL +D3 PO) Take by mouth. 2 tablets twice a day    [provider]  Cholecalciferol (D3-1000 PO) Take by mouth.    [provider]  Coenzyme Q10 (CO Q10) 100 MG CAPS Take by mouth.    [provider]  dalfampridine 10 MG TB12 Take 10 mg by mouth every 12 (twelve) hours.    [provider]  DULoxetine (CYMBALTA) 60 MG capsule Take 60 mg by mouth daily. 09/20/18   [provider]  fexofenadine (ALLEGRA ALLERGY) 180 MG tablet Take 1 tablet (180 mg total) by mouth daily. 06/07/19   Jodelle Green, FNP  fluticasone (FLONASE) 50 MCG/ACT nasal spray Place 1 spray into both nostrils daily. 06/07/19   Jodelle Green, FNP  hydrocortisone (ANUSOL-HC) 25 MG suppository Place 1 suppository (25 mg total) rectally 2 (two) times daily. 09/27/18   Burnard Hawthorne, FNP  hydrocortisone 2.5 % cream Apply topically 2 (two) times daily as needed (Rash). 06/08/20   Moye, [place], MD  L-Methylfolate-Algae (DEPLIN 15 PO) Take 15 mg by mouth 1 day or 1 dose.    [provider]  LORazepam (ATIVAN) 0.5 MG tablet Take 0.5 mg by mouth at bedtime. 06/23/19   [provider]  Magnesium Oxide 400 (240 Mg) MG TABS TAKE ONE TABLET EVERY DAY 09/14/20   Einar Pheasant, MD  mirtazapine (REMERON) 30 MG tablet Take 30 mg by mouth daily. 12/23/18   [provider]  ondansetron (ZOFRAN ODT) 4 MG disintegrating tablet Take 1 tablet (4 mg total) by mouth every 8 (eight) hours as needed for nausea or vomiting. 08/11/18   McLean-Scocuzza, Nino Glow, MD  oxazepam (SERAX) 10 MG capsule Take 10 mg by mouth at bedtime as needed for sleep or anxiety.    [provider]  sodium chloride (V-R NASAL SPRAY SALINE) 0.65 % nasal spray Place into the nose.    [provider]   tiZANidine (ZANAFLEX) 4 MG tablet Take 1 tablet by mouth 3 (three) times daily as needed. 10/21/18   [provider]  traZODone (DESYREL) 50 MG tablet Take 50 mg by mouth at bedtime.  08/01/18   [provider]    Allergies Sulfamethoxazole-trimethoprim, Asa [aspirin], Buspirone, Levofloxacin, Lexapro [escitalopram oxalate], Motrin [ibuprofen], Pollen extract, Soy allergy, Ceftin [cefuroxime axetil], Penicillins, and Sulfa antibiotics    Social History Social History   Tobacco Use  . Smoking status: Never Smoker  . Smokeless tobacco: Never Used  Vaping Use  . Vaping Use: Never used  Substance Use Topics  . Alcohol use: No    Alcohol/week: 0.0 standard drinks  . Drug use: No    Review of Systems Patient denies headaches, rhinorrhea, blurry vision, numbness, shortness of breath, chest pain, edema, cough, abdominal pain, nausea, vomiting, diarrhea, dysuria, fevers, rashes or hallucinations unless otherwise stated  above in HPI. ____________________________________________   PHYSICAL EXAM:  VITAL SIGNS: Vitals:   12/08/20 2130 12/08/20 2200  BP: (!) 160/97 (!) 163/94  Pulse: 87 94  Resp: 18 18  Temp:    SpO2: 96% 96%    Constitutional: Alert and oriented.  Eyes: Conjunctivae are normal.  Head: Atraumatic. Nose: No congestion/rhinnorhea. Mouth/Throat: Mucous membranes are moist.   Neck: No stridor. Painless ROM.  Cardiovascular: Normal rate, regular rhythm. Grossly normal heart sounds.  Good peripheral circulation. Respiratory: Normal respiratory effort.  No retractions. Lungs CTAB. Gastrointestinal: Soft and nontender. No distention. No abdominal bruits. No CVA tenderness. Genitourinary:  Musculoskeletal: No lower extremity tenderness nor edema.  No joint effusions. Neurologic:  CN- intact.  No facial droop, mild dysmetria of RUE,  + rue drift.  Decreased grip strength,.  Normal heel to shin.  Sensation intact bilaterally. Normal speech and  language. Skin:  Skin is warm, dry and intact. No rash noted. Psychiatric: Mood and affect are normal. Speech and behavior are normal.  ____________________________________________   LABS (all labs ordered are listed, but only abnormal results are displayed)  Results for orders placed or performed during the hospital encounter of 12/08/20 (from the past 24 hour(s))  Ethanol     Status: None   Collection Time: 12/08/20  7:43 PM  Result Value Ref Range   Alcohol, Ethyl (B) <10 <10 mg/dL  CBC WITH DIFFERENTIAL     Status: None   Collection Time: 12/08/20  7:43 PM  Result Value Ref Range   WBC 9.3 4.0 - 10.5 K/uL   RBC 4.87 3.87 - 5.11 MIL/uL   Hemoglobin 13.7 12.0 - 15.0 g/dL   HCT 41.3 36.0 - 46.0 %   MCV 84.8 80.0 - 100.0 fL   MCH 28.1 26.0 - 34.0 pg   MCHC 33.2 30.0 - 36.0 g/dL   RDW 14.6 11.5 - 15.5 %   Platelets 299 150 - 400 K/uL   nRBC 0.0 0.0 - 0.2 %   Neutrophils Relative % 75 %   Neutro Abs 7.1 1.7 - 7.7 K/uL   Lymphocytes Relative 18 %   Lymphs Abs 1.7 0.7 - 4.0 K/uL   Monocytes Relative 5 %   Monocytes Absolute 0.4 0.1 - 1.0 K/uL   Eosinophils Relative 1 %   Eosinophils Absolute 0.1 0.0 - 0.5 K/uL   Basophils Relative 1 %   Basophils Absolute 0.1 0.0 - 0.1 K/uL   Immature Granulocytes 0 %   Abs Immature Granulocytes 0.02 0.00 - 0.07 K/uL  Comprehensive metabolic panel     Status: Abnormal   Collection Time: 12/08/20  7:43 PM  Result Value Ref Range   Sodium 138 135 - 145 mmol/L   Potassium 3.8 3.5 - 5.1 mmol/L   Chloride 104 98 - 111 mmol/L   CO2 23 22 - 32 mmol/L   Glucose, Bld 149 (H) 70 - 99 mg/dL   BUN 11 8 - 23 mg/dL   Creatinine, Ser 0.69 0.44 - 1.00 mg/dL   Calcium 9.3 8.9 - 10.3 mg/dL   Total Protein 7.4 6.5 - 8.1 g/dL   Albumin 4.4 3.5 - 5.0 g/dL   AST 29 15 - 41 U/L   ALT 26 0 - 44 U/L   Alkaline Phosphatase 100 38 - 126 U/L   Total Bilirubin 0.7 0.3 - 1.2 mg/dL   GFR, Estimated >60 >60 mL/min   Anion gap 11 5 - 15  Troponin I (High  Sensitivity)     Status: None  Collection Time: 12/08/20  7:43 PM  Result Value Ref Range   Troponin I (High Sensitivity) 5 <18 ng/L  Resp Panel by RT-PCR (Flu A&B, Covid) Nasopharyngeal Swab     Status: None   Collection Time: 12/08/20  8:11 PM   Specimen: Nasopharyngeal Swab; Nasopharyngeal(NP) swabs in vial transport medium  Result Value Ref Range   SARS Coronavirus 2 by RT PCR NEGATIVE NEGATIVE   Influenza A by PCR NEGATIVE NEGATIVE   Influenza B by PCR NEGATIVE NEGATIVE  CBG monitoring, ED     Status: Abnormal   Collection Time: 12/08/20  8:41 PM  Result Value Ref Range   Glucose-Capillary 116 (H) 70 - 99 mg/dL   ____________________________________________  EKG My review and personal interpretation at Time: 19:46 Indication: weakness  Rate: 95  Rhythm: sinus Axis: normal Other: normal intervals, no stemi ____________________________________________  RADIOLOGY  I personally reviewed all radiographic images ordered to evaluate for the above acute complaints and reviewed radiology reports and findings.  These findings were personally discussed with the patient.  Please see medical record for radiology report.  ____________________________________________   PROCEDURES  Procedure(s) performed:  Procedures    Critical Care performed: no ____________________________________________   INITIAL IMPRESSION / ASSESSMENT AND PLAN / ED COURSE  Pertinent labs & imaging results that were available during my care of the patient were reviewed by me and considered in my medical decision making (see chart for details).   DDX: cva, tia, hypoglycemia, dehydration, electrolyte abnormality, dissection, sepsis   Jennessa JENNFIER ABDULLA is a 70 y.o. who presents to the ED with presentation as described above.  Patient is outside of the window for TPA therefore code stroke deferred but will proceed with stroke work-up CT head.  Will consult teleneurology.  Blood work sent for the above  differential.  Clinical Course as of 12/08/20 2257  Sat Dec 08, 2020  2009 Resp Panel by RT-PCR (Flu A&B, Covid) Nasopharyngeal Swab [PR]  2201 Patient evaluated by telemetry neuro.  Recommending MRI for further evaluation.  Given her presentation will discuss with hospitalist for admission. [PR]    Clinical Course User Index [PR] Merlyn Lot, MD    The patient was evaluated in Emergency Department today for the symptoms described in the history of present illness. He/she was evaluated in the context of the global COVID-19 pandemic, which necessitated consideration that the patient might be at risk for infection with the SARS-CoV-2 virus that causes COVID-19. Institutional protocols and algorithms that pertain to the evaluation of patients at risk for COVID-19 are in a state of rapid change based on information released by regulatory bodies including the CDC and federal and state organizations. These policies and algorithms were followed during the patient's care in the ED.  As part of my medical decision making, I reviewed the following data within the [place] notes reviewed and incorporated, Labs reviewed, notes from prior ED visits and [place] Controlled Substance Database   ____________________________________________   FINAL CLINICAL IMPRESSION(S) / ED DIAGNOSES  Final diagnoses:  RUE weakness      NEW MEDICATIONS STARTED DURING THIS VISIT:  New Prescriptions   No medications on file     Note:  This document was prepared using Dragon voice recognition software and may include unintentional dictation errors.    Merlyn Lot, MD 12/08/20 2257

## 2020-12-08 NOTE — ED Notes (Addendum)
Trilby Drummer, MD at bedside assessing patient.

## 2020-12-08 NOTE — H&P (Signed)
History and Physical   LEVY WELLMAN QIH:[id number] DOB: June 14, 1951 DOA: 12/08/2020  PCP: Einar Pheasant, MD   Patient coming from: Home  Chief Complaint: Weakness  HPI: Carly [name] is a 70 y.o. female with medical history significant of MS, hypertension, memory changes, degenerative disc disease status post lumbar fusion, depression and anxiety, history of adenomatous polyps, GERD, migraine who presents with 1 day of weakness.  Patient states that she was feeling generally weak last night and was feeling dehydrated at the time.  She states she drank some fluids and felt a little bit better this morning.  Then later today when she was exercising around noon she noticed that she had bilateral weakness especially in her grip strength right greater than left and she was having trouble picking things up with her right hand.  She also began to feel like she might pass out, but never actually fell and lay down on the floor instead.   Patient does report new onset he does have flatulence, she states she has a colonoscopy scheduled for this. She has fever, chills, chest pain, shortness breath, abdominal pain, constipation, diarrhea, nausea, vomiting.  ED Course: Vital signs in the ED were significant for blood pressure in the 875I systolic.  Lab work-up showed CMP with glucose 149.  CBC within normal limits.  Troponin negative x1 with repeat pending.  Rest were panel for flu and COVID negative pending ethanol level negative.  UDS and urinalysis pending.  Teleneurology was consulted by the ED due to possibility of stroke versus MS flare.  And they agree that this is possibly TIA versus CVA versus MS flare versus new lesion.  Recommend CT head and MRI with and without contrast.  Also recommend formal neuro consult in the morning.  Review of Systems: As per HPI otherwise all other systems reviewed and are negative.  Past Medical History:  Diagnosis Date  . Allergy   . Anxiety   . Aspiration  pneumonia ([place])   . Depression   . Frequent headaches    H/O  . GERD (gastroesophageal reflux disease)   . History of chicken pox   . History of colon polyps   . Hx of migraines   . Multiple sclerosis ([place])   . PONV (postoperative nausea and vomiting)     Past Surgical History:  Procedure Laterality Date  . BACK SURGERY    . CHOLECYSTECTOMY    . COLONOSCOPY WITH PROPOFOL N/A 05/18/2017   Procedure: COLONOSCOPY WITH PROPOFOL;  Surgeon: [name], MD;  Location: [facility];  Service: Endoscopy;  Laterality: N/A;  . FOOT SURGERY  2015  . GALLBLADDER SURGERY  2008  . HARDWARE REMOVAL Left 02/14/2016   Procedure: LEFT FOOT REMOVAL DEEP IMPLANT;  Surgeon: [name], MD;  Location: [place];  Service: Orthopedics;  Laterality: Left;  . HERNIA REPAIR     Inguinal Hernia Repair  . [place]  2014    Social History  reports that she has never smoked. She has never used smokeless tobacco. She reports that she does not drink alcohol and does not use drugs.  Allergies  Allergen Reactions  . Sulfamethoxazole-Trimethoprim Itching  . Asa [Aspirin] Swelling  . Buspirone Other (See Comments)    Headaches    . Levofloxacin Other (See Comments)    "weakness"  . Lexapro [Escitalopram Oxalate] Other (See Comments)    Weakness   . Motrin [Ibuprofen] Swelling and Other (See Comments)  . Pollen Extract Hives  . Soy  Allergy Other (See Comments)  . Ceftin [Cefuroxime Axetil] Rash  . Penicillins Rash  . Sulfa Antibiotics Rash    Family History  Problem Relation Age of Onset  . Arthritis Mother   . Hypertension Mother   . Macular degeneration Mother   . Hypertension Father   . Hyperlipidemia Father   . Heart disease Maternal Grandfather   . Diabetes Maternal Grandfather   . Kidney disease Paternal Grandmother   Reviewed on admission  Prior to Admission medications   Medication Sig Start Date End Date Taking? Authorizing Provider  acetaminophen  (TYLENOL) 325 MG tablet Take 650 mg by mouth every 4 (four) hours as needed.    [provider]  azelastine (ASTELIN) 0.1 % nasal spray Place 1 spray into both nostrils 2 (two) times daily. Use in each nostril as directed 11/28/20   Einar Pheasant, MD  B Complex Vitamins (VITAMIN B COMPLEX PO) Take by mouth.    [provider]  Calcium-Phosphorus-Vitamin D (CITRACAL +D3 PO) Take by mouth. 2 tablets twice a day    [provider]  Cholecalciferol (D3-1000 PO) Take by mouth.    [provider]  Coenzyme Q10 (CO Q10) 100 MG CAPS Take by mouth.    [provider]  dalfampridine 10 MG TB12 Take 10 mg by mouth every 12 (twelve) hours.    [provider]  DULoxetine (CYMBALTA) 60 MG capsule Take 60 mg by mouth daily. 09/20/18   [provider]  fexofenadine (ALLEGRA ALLERGY) 180 MG tablet Take 1 tablet (180 mg total) by mouth daily. 06/07/19   Jodelle Green, FNP  fluticasone (FLONASE) 50 MCG/ACT nasal spray Place 1 spray into both nostrils daily. 06/07/19   Jodelle Green, FNP  hydrocortisone (ANUSOL-HC) 25 MG suppository Place 1 suppository (25 mg total) rectally 2 (two) times daily. 09/27/18   Burnard Hawthorne, FNP  hydrocortisone 2.5 % cream Apply topically 2 (two) times daily as needed (Rash). 06/08/20   Moye, [place], MD  L-Methylfolate-Algae (DEPLIN 15 PO) Take 15 mg by mouth 1 day or 1 dose.    [provider]  LORazepam (ATIVAN) 0.5 MG tablet Take 0.5 mg by mouth at bedtime. 06/23/19   [provider]  Magnesium Oxide 400 (240 Mg) MG TABS TAKE ONE TABLET EVERY DAY 09/14/20   Einar Pheasant, MD  mirtazapine (REMERON) 30 MG tablet Take 30 mg by mouth daily. 12/23/18   [provider]  ondansetron (ZOFRAN ODT) 4 MG disintegrating tablet Take 1 tablet (4 mg total) by mouth every 8 (eight) hours as needed for nausea or vomiting. 08/11/18   McLean-Scocuzza, Nino Glow, MD  oxazepam (SERAX) 10 MG capsule Take 10 mg by  mouth at bedtime as needed for sleep or anxiety.    [provider]  sodium chloride (V-R NASAL SPRAY SALINE) 0.65 % nasal spray Place into the nose.    [provider]  tiZANidine (ZANAFLEX) 4 MG tablet Take 1 tablet by mouth 3 (three) times daily as needed. 10/21/18   [provider]  traZODone (DESYREL) 50 MG tablet Take 50 mg by mouth at bedtime.  08/01/18   [provider]    Physical Exam: Vitals:   12/08/20 1941 12/08/20 2030 12/08/20 2130 12/08/20 2200  BP:  (!) 165/99 (!) 160/97 (!) 163/94  Pulse:  97 87 94  Resp:  17 18 18   Temp:      TempSrc:      SpO2:  95% 96% 96%  Weight: 83.5  kg     Height: 5\' 3"  (1.6 m)      Physical Exam Constitutional:      General: She is not in acute distress.    Appearance: Normal appearance.  HENT:     Head: Normocephalic and atraumatic.     Mouth/Throat:     Mouth: Mucous membranes are moist.     Pharynx: Oropharynx is clear.  Eyes:     Extraocular Movements: Extraocular movements intact.     Pupils: Pupils are equal, round, and reactive to light.  Cardiovascular:     Rate and Rhythm: Normal rate and regular rhythm.     Pulses: Normal pulses.     Heart sounds: Normal heart sounds.  Pulmonary:     Effort: Pulmonary effort is normal. No respiratory distress.     Breath sounds: Normal breath sounds.  Abdominal:     General: Bowel sounds are normal. There is no distension.     Palpations: Abdomen is soft.     Tenderness: There is no abdominal tenderness.  Musculoskeletal:        General: No swelling or deformity.     Right lower leg: Edema present.     Left lower leg: Edema present.  Skin:    General: Skin is warm and dry.  Neurological:     Comments: Mental Status: Patient is awake, alert, oriented x3 No signs of aphasia or neglect Cranial Nerves: II: Pupils equal, round, and reactive to light.  III,IV, VI: EOMI without ptosis or diploplia.  V: Facial sensation is symmetric tolight  touch. VII: Facial movement is symmetric.  VIII: hearing is intact to voice X: Uvula elevates symmetrically XI: Shoulder shrug is symmetric. XII: tongue is midline without atrophy or fasciculations.  Motor: Good effort thorughout, at Least 4/5 right UE, 5/5 left UE; 5/5 bilateral lower extremities Sensory: Sensation is grossly intact bilateral UEs & LEs Cerebellar: Difficulty with Finger-Nose on right.     Labs on Admission: I have personally reviewed following labs and imaging studies  CBC: Recent Labs  Lab 12/08/20 1943  WBC 9.3  NEUTROABS 7.1  HGB 13.7  HCT 41.3  MCV 84.8  PLT 810    Basic Metabolic Panel: Recent Labs  Lab 12/08/20 1943  NA 138  K 3.8  CL 104  CO2 23  GLUCOSE 149*  BUN 11  CREATININE 0.69  CALCIUM 9.3   GFR: Estimated Creatinine Clearance: 66.9 mL/min (by C-G formula based on SCr of 0.69 mg/dL).  Liver Function Tests: Recent Labs  Lab 12/08/20 1943  AST 29  ALT 26  ALKPHOS 100  BILITOT 0.7  PROT 7.4  ALBUMIN 4.4   Urine analysis:    Component Value Date/Time   COLORURINE YELLOW 08/16/2018 [place] 08/16/2018 1633   LABSPEC 1.010 08/16/2018 1633   PHURINE 7.0 08/16/2018 1633   GLUCOSEU NEGATIVE 08/16/2018 1633   HGBUR NEGATIVE 08/16/2018 1633   BILIRUBINUR NEGATIVE 08/16/2018 1633   KETONESUR NEGATIVE 08/16/2018 1633   PROTEINUR CANCELED 08/11/2018 1509   PROTEINUR NEGATIVE 02/24/2017 1640   UROBILINOGEN 0.2 08/16/2018 1633   NITRITE NEGATIVE 08/16/2018 1633   LEUKOCYTESUR NEGATIVE 08/16/2018 1633   Radiological Exams on Admission: CT HEAD WO CONTRAST  Result Date: 12/08/2020 CLINICAL DATA:  Arm weakness. EXAM: CT HEAD WITHOUT CONTRAST TECHNIQUE: Contiguous axial images were obtained from the base of the skull through the vertex without intravenous contrast. COMPARISON:  December 21, 2017. FINDINGS: Brain: Mild diffuse cortical atrophy is noted. Moderate chronic ischemic white  matter disease is noted. No mass  effect or midline shift is noted. Ventricular size is within normal limits. There is no evidence of mass lesion, hemorrhage or acute infarction. Vascular: No hyperdense vessel or unexpected calcification. Skull: Normal. Negative for fracture or focal lesion. Sinuses/Orbits: No acute finding. Other: None. IMPRESSION: Mild diffuse cortical atrophy. Moderate chronic ischemic white matter disease. No acute intracranial abnormality seen. Electronically Signed   By: Marijo Conception M.D.   On: 12/08/2020 21:25   DG Chest Portable 1 View  Result Date: 12/08/2020 CLINICAL DATA:  Weakness EXAM: PORTABLE CHEST 1 VIEW COMPARISON:  December 20, 2017 FINDINGS: The heart size and mediastinal contours are within normal limits. Aortic knob calcifications. Both lungs are clear. The visualized skeletal structures are unremarkable. IMPRESSION: No active disease. Electronically Signed   By: Prudencio Pair M.D.   On: 12/08/2020 21:17   EKG: Independently reviewed.  Sinus rhythm at 94 bpm.  Assessment/Plan Active Problems:   Multiple sclerosis (HCC)   BP (high blood pressure)   Depression with anxiety   Acute focal neurological deficit  Acute focal neurologic deficit History of MS > ?TIA/CVA versus MS flare versus new lesion > Patient has history of MS, there was some initial question about this diagnosis due to the nature/locations of her lesions however oligoclonal band testing confirmed MS. > As per HPI she had episode of weakness of the upper extremities right greater than left.  Symptoms did improve some while in the ED.  Teleneurology recommend stroke and metabolic work-up and MRI to evaluate for possible MS flare worsening lesions. - Patient will need formal neurology consult in the morning - We will check TSH, mag, Phos her initial laboratory results were within normal limits. - Continue home Dalfamprimine for MS - Follow-up MRI with and without contrast - Allow for permissive HTN  - We will hold off on  antiplatelets, statins, echo, carotid evaluation pending work-up (MRI) - A1C  - Lipid panel  - Tele monitoring  - SLP eval - PT/OT  Hypertension - Permissive hypertension for now as above  Anxiety/depression - Continue home Cymbalta, Ativan, mirtazapine  DVT prophylaxis: Lovenox  Code Status:   Full Family Communication:  Husband updated at bedside Disposition Plan:   Patient is from:  Home  Anticipated DC to:  Home  Anticipated DC date:  1 to 2 days  Anticipated DC barriers: None  Consults called:  Teleneurology consult in ED, will need formal neurology consult in the morning  Admission status:  Observation, MedSurg with telemetry  Severity of Illness: The appropriate patient status for this patient is OBSERVATION. Observation status is judged to be reasonable and necessary in order to provide the required intensity of service to ensure the patient's safety. The patient's presenting symptoms, physical exam findings, and initial radiographic and laboratory data in the context of their medical condition is felt to place them at decreased risk for further clinical deterioration. Furthermore, it is anticipated that the patient will be medically stable for discharge from the hospital within 2 midnights of admission. The following factors support the patient status of observation.   " The patient's presenting symptoms include weakness right greater than left. " The physical exam findings include right upper extremity grip weakness, abnormal right finger-nose test. " The initial radiographic and laboratory data are lab work-up is stable, no acute changes on CT head, no acute acute [place] on chest x-ray, stable thus far.Marcelyn Bruins MD [facility]  How to contact the  [facility] Attending or Consulting provider [place] or covering provider during after hours [place], for this patient?   1. Check the care team in [facility] and look for a) attending/consulting [facility] provider listed and b)  the [facility] team listed 2. Log into [email or web address] and use [place]'s universal password to access. If you do not have the password, please contact the hospital operator. 3. Locate the [facility] provider you are looking for under [facility] and page to a number that you can be directly reached. 4. If you still have difficulty reaching the provider, please page the [facility] (Director on Call) for the Hospitalists listed on amion for assistance.  12/08/2020, 11:10 PM

## 2020-12-08 NOTE — ED Notes (Signed)
Teleneuro cart at bedside

## 2020-12-08 NOTE — ED Notes (Signed)
Patient to CT at this time

## 2020-12-08 NOTE — ED Triage Notes (Signed)
Patient arrives via EMS from home for weakness. Patient has hx of MS and has been feeling more weak than normal starting last night. Patient did fall at home PTA but did not hit head and is not on blood thinners. Patient AOx4 at this time.

## 2020-12-08 NOTE — ED Notes (Signed)
Vickki Muff, MD assessing patient via tele-neuro.

## 2020-12-08 NOTE — ED Notes (Signed)
Patient placed on pure wick °

## 2020-12-08 NOTE — Progress Notes (Signed)
PHARMACIST - PHYSICIAN COMMUNICATION  CONCERNING:  Enoxaparin (Lovenox) for DVT Prophylaxis    RECOMMENDATION: Patient was prescribed enoxaprin 40mg  q24 hours for VTE prophylaxis.   Filed Weights   12/08/20 1941  Weight: 83.5 kg (184 lb)    Body mass index is 32.59 kg/m.  Estimated Creatinine Clearance: 66.9 mL/min (by C-G formula based on SCr of 0.69 mg/dL).   Based on [place] patient is candidate for enoxaparin 0.5mg /kg TBW SQ every 24 hours based on BMI being >30.  DESCRIPTION: Pharmacy has adjusted enoxaparin dose per [facility] policy.  Patient is now receiving enoxaparin 0.5 mg/kg every 24 hours   Renda Rolls, PharmD, [facility] 12/08/2020 10:43 PM

## 2020-12-08 NOTE — Consult Note (Signed)
[facility] TeleNeurology Consult Services  Stat Consult  Date of Service:   12/08/2020 20:11:10  Diagnosis:     .  R53.1 - Weakness  Impression: Pt with a h/o MS presents with generalized weakness, numbness, paresthesias. Initially noted to have right sided weakness in the ER, but currently NIHSS =0. Suspect general medical issue (respiratory or infectious) causing generalized weakness, likely worse due to h/o MS. Ddx includes stroke, MS exacerbation or new lesion. Recommend medical w/u and CT head w/o followed by admission and MRI brain w/ and w/o to clarify etiology.  Our recommendations are outlined below.  Laboratory Studies: Recommend Lipid panel Hemoglobin A1c TSH  Nursing Recommendations: Telemetry, IV Fluids, avoid dextrose containing fluids, Maintain euglycemia Neuro checks q4 hrs x 24 hrs and then per shift Head of bed 30 degrees  Consultations: Recommend Speech therapy if failed dysphagia screen Physical therapy/Occupational therapy  DVT Prophylaxis: Choice of Primary Team  Disposition: Neurology will follow  Additional Recommendations:    Metrics: [facility] Notification Time: 12/08/2020 20:09:13 Stamp Time: 12/08/2020 20:11:10 Callback Response Time: 12/08/2020 20:12:36   ----------------------------------------------------------------------------------------------------  Chief Complaint: weakness  History of Present Illness: Patient is a 70 year old Female.  89F with a h/o of MS presents with weakness. She has chronic weakness of her left leg due to the MS. She noted bilateral weakness yesterday, generalized weakness- she states that she could barely stand up. This morning she initially felt normal however then went to exercise and she was able to exercise. She states that she was upset about where her husband was. Then she noted bilateral hand weakness and felt weak all over. Her hands were numb bilaterally and tingling.  This occurred at approximately 1300. She was able to walk with her walker. She has been having congestion and respiratory problems, feeling "run down" for 3 weeks. She takes ampyra for walking with MS. No immune therapy. She was initially noted to have more right sided weakness in the ER but the pt states she is having generalized weakness.   Past Medical History:     . There is NO history of Hypertension     . There is NO history of Diabetes Mellitus     . There is NO history of Hyperlipidemia     . There is NO history of Atrial Fibrillation     . There is NO history of Coronary Artery Disease     . There is NO history of Stroke     . There is NO history of Covid-19  Anticoagulant use:  No  Antiplatelet use: No   Examination: BP(163/93), Pulse(93), Blood Glucose(pending) 1A: Level of Consciousness - Alert; keenly responsive + 0 1B: Ask Month and Age - Both Questions Right + 0 1C: Blink Eyes & Squeeze Hands - Performs Both Tasks + 0 2: Test Horizontal Extraocular Movements - Normal + 0 3: Test Visual Fields - No Visual Loss + 0 4: Test Facial Palsy (Use [place]imace if Obtunded) - Normal symmetry + 0 5A: Test Left Arm Motor Drift - No Drift for 10 Seconds + 0 5B: Test Right Arm Motor Drift - No Drift for 10 Seconds + 0 6A: Test Left Leg Motor Drift - No Drift for 5 Seconds + 0 6B: Test Right Leg Motor Drift - No Drift for 5 Seconds + 0 7: Test Limb Ataxia (FNF/Heel-Shin) - No Ataxia + 0 8: Test Sensation - Normal; No sensory loss + 0 9: Test Language/Aphasia - Normal; No aphasia + 0 10:  Test Dysarthria - Normal + 0 11: Test Extinction/Inattention - No abnormality + 0  NIHSS Score: 0    Patient / Family was informed the Neurology Consult would occur via TeleHealth consult by way of interactive audio and video telecommunications and consented to receiving care in this manner.  Patient is being evaluated for possible acute neurologic impairment and high probability of imminent or  life - threatening deterioration.I spent total of 26 minutes providing care to this patient, including time for face to face visit via telemedicine, review of medical records, imaging studies and discussion of findings with providers, the patient and / or family.   Dr Jeralene Huff   [facility] [phone]  Case [id number]

## 2020-12-09 ENCOUNTER — Observation Stay: Payer: Medicare Other

## 2020-12-09 DIAGNOSIS — I639 Cerebral infarction, unspecified: Secondary | ICD-10-CM

## 2020-12-09 DIAGNOSIS — G35 Multiple sclerosis: Secondary | ICD-10-CM | POA: Diagnosis not present

## 2020-12-09 DIAGNOSIS — R29818 Other symptoms and signs involving the nervous system: Secondary | ICD-10-CM

## 2020-12-09 LAB — URINALYSIS, ROUTINE W REFLEX MICROSCOPIC
Bacteria, UA: NONE SEEN
Bilirubin Urine: NEGATIVE
Glucose, UA: NEGATIVE mg/dL
Hgb urine dipstick: NEGATIVE
Ketones, ur: NEGATIVE mg/dL
Leukocytes,Ua: NEGATIVE
Nitrite: NEGATIVE
Protein, ur: NEGATIVE mg/dL
Specific Gravity, Urine: 1.014 (ref 1.005–1.030)
WBC, UA: NONE SEEN WBC/hpf (ref 0–5)
pH: 5 (ref 5.0–8.0)

## 2020-12-09 LAB — URINE DRUG SCREEN, QUALITATIVE (ARMC ONLY)
Amphetamines, Ur Screen: NOT DETECTED
Barbiturates, Ur Screen: NOT DETECTED
Benzodiazepine, Ur Scrn: NOT DETECTED
Cannabinoid 50 Ng, Ur ~~LOC~~: NOT DETECTED
Cocaine Metabolite,Ur ~~LOC~~: NOT DETECTED
MDMA (Ecstasy)Ur Screen: NOT DETECTED
Methadone Scn, Ur: NOT DETECTED
Opiate, Ur Screen: NOT DETECTED
Phencyclidine (PCP) Ur S: NOT DETECTED
Tricyclic, Ur Screen: NOT DETECTED

## 2020-12-09 LAB — MAGNESIUM: Magnesium: 2.3 mg/dL (ref 1.7–2.4)

## 2020-12-09 LAB — TROPONIN I (HIGH SENSITIVITY): Troponin I (High Sensitivity): 5 ng/L (ref <18)

## 2020-12-09 LAB — PHOSPHORUS: Phosphorus: 3.5 mg/dL (ref 2.5–4.6)

## 2020-12-09 LAB — LIPID PANEL
Cholesterol: 243 mg/dL — ABNORMAL HIGH (ref 0–200)
HDL: 72 mg/dL (ref >40)
LDL Cholesterol: 143 mg/dL — ABNORMAL HIGH (ref 0–99)
Total CHOL/HDL Ratio: 3.4 RATIO
Triglycerides: 141 mg/dL (ref <150)
VLDL: 28 mg/dL (ref 0–40)

## 2020-12-09 LAB — HIV ANTIBODY (ROUTINE TESTING W REFLEX): HIV Screen 4th Generation wRfx: NONREACTIVE

## 2020-12-09 LAB — HEMOGLOBIN A1C
Hgb A1c MFr Bld: 5.7 % — ABNORMAL HIGH (ref 4.8–5.6)
Mean Plasma Glucose: 116.89 mg/dL

## 2020-12-09 LAB — TSH: TSH: 2.522 u[IU]/mL (ref 0.350–4.500)

## 2020-12-09 MED ORDER — GADOBUTROL 1 MMOL/ML IV SOLN
7.0000 mL | Freq: Once | INTRAVENOUS | Status: AC | PRN
  Administered 2020-12-09: 7.5 mL via INTRAVENOUS

## 2020-12-09 MED ORDER — LORAZEPAM 0.5 MG PO TABS
0.2500 mg | ORAL_TABLET | Freq: Every day | ORAL | Status: DC
  Administered 2020-12-09 – 2020-12-12 (×5): 0.25 mg via ORAL
  Filled 2020-12-09 (×5): qty 1

## 2020-12-09 NOTE — Progress Notes (Signed)
Inpatient Rehab Admissions Coordinator Note:   Per PT recommendation, pt was screened for CIR candidacy by Gayland Curry, MS, CCC-SLP.  Note pt is under observation status at this time; the pt may not have the medical necessity to warrant an inpatient rehab stay if they remain observation. If status were to change to inpatient, [facility] will screen for candidacy.      Gayland Curry, [place], [place] Admissions Coordinator [phone] 12/09/20 2:01 PM

## 2020-12-09 NOTE — Plan of Care (Signed)
  Problem: Education: Goal: Knowledge of General Education information will improve Description: Including pain rating scale, medication(s)/side effects and non-pharmacologic comfort measures Outcome: Progressing   Problem: Health Behavior/Discharge Planning: Goal: Ability to manage health-related needs will improve Outcome: Progressing   Problem: Clinical Measurements: Goal: Ability to maintain clinical measurements within normal limits will improve Outcome: Progressing Goal: Will remain free from infection Outcome: Progressing Goal: Diagnostic test results will improve Outcome: Progressing Goal: Respiratory complications will improve Outcome: Progressing Goal: Cardiovascular complication will be avoided Outcome: Progressing   Problem: Activity: Goal: Risk for activity intolerance will decrease Outcome: Progressing   Problem: Nutrition: Goal: Adequate nutrition will be maintained Outcome: Progressing   Problem: Coping: Goal: Level of anxiety will decrease Outcome: Progressing   Problem: Elimination: Goal: Will not experience complications related to bowel motility Outcome: Progressing Goal: Will not experience complications related to urinary retention Outcome: Progressing   Problem: Pain Managment: Goal: General experience of comfort will improve Outcome: Progressing   Problem: Safety: Goal: Ability to remain free from injury will improve Outcome: Progressing   Problem: Skin Integrity: Goal: Risk for impaired skin integrity will decrease Outcome: Progressing   Problem: Education: Goal: Knowledge of disease or condition will improve Outcome: Progressing Goal: Knowledge of secondary prevention will improve Outcome: Progressing Goal: Knowledge of patient specific risk factors addressed and post discharge goals established will improve Outcome: Progressing   

## 2020-12-09 NOTE — ED Notes (Signed)
Pt in MRI.

## 2020-12-09 NOTE — ED Notes (Signed)
This nurse contacted Lauralee Evener RN at this time to take report via secure chat

## 2020-12-09 NOTE — Consult Note (Signed)
Referring Physician: Dr. Dwyane Dee    Chief Complaint: Weakness  HPI: Carly [name] is an 70 y.o. female with a history of multiple sclerosis who presented to the ED yesterday evening via EMS with a chief complaint of weakness. The weakness initially began as generalized weakness on Friday night; being barely able to stand up. She felt dehydrated and drank some fluid, which resulted in her feeling better. On Saturday at about 1300, after she was exercising, she started to experience weakness in her bilateral extremities, right worse than left. For example, she was having trouble picking things up with her right hand. Her hands were numb bilaterally and tingling. She also felt as though she was about to pass out. On arrival to the ED, she was still having right hand weakness, but it was improving. She denied slurred speech, neck pain, CP or abdominal pain.  Of note, she has chronic weakness of her left leg due to her MS. There was some initial question about this diagnosis due to the nature/locations of her lesions on imaging; however, oligoclonal band testing confirmed MS. She has been on Solumedrol in the past, which was changed to Tysabri and then Ocrevus. She is not currently on any disease-modifying medications for her MS, although she does take dalfampridine (Ampyra) for her chronic gait dysfunction.   Teleneurology assessed the patient. NIHSS was 0. Admission for further work up including MRI brain was recommended.   An MRI brain was obtained, revealing an acute white matter infarction in the deep white matter of the left corona radiata. MRA revealed distal atherosclerotic irregularity diffusely, most severe in the PCA branches.   MRI brain with and without contrast: 1. Acute white matter infarction in the deep white matter of the left corona radiata region. The appearance is much more consistent with a white matter infarction than an acute MS plaque. No mass effect or acute hemorrhage. 2.  Extensive abnormal T2 and FLAIR signal throughout the brain as outlined above, similar to the study of 2017. The white matter disease could be due to a combination of small-vessel disease and chronic demyelinating disease. There is probably slight increase in central volume loss with the ventricles being slightly more prominent.  MRA brain: MR angiography does not show any correctable proximal stenosis or large vessel occlusion. Distal vessel atherosclerotic irregularity diffusely. This is most severe in the PCA branches.  LSN: Saturday at noon.  tPA Given: No: Out of the time window.   Past Medical History:  Diagnosis Date  . Allergy   . Anxiety   . Aspiration pneumonia ([place])   . Depression   . Frequent headaches    H/O  . GERD (gastroesophageal reflux disease)   . History of chicken pox   . History of colon polyps   . Hx of migraines   . Multiple sclerosis ([place])   . PONV (postoperative nausea and vomiting)     Past Surgical History:  Procedure Laterality Date  . BACK SURGERY    . CHOLECYSTECTOMY    . COLONOSCOPY WITH PROPOFOL N/A 05/18/2017   Procedure: COLONOSCOPY WITH PROPOFOL;  Surgeon: [name], MD;  [facility] ENDOSCOPY;  Service: Endoscopy;  Laterality: N/A;  . FOOT SURGERY  2015  . GALLBLADDER SURGERY  2008  . HARDWARE REMOVAL Left 02/14/2016   Procedure: LEFT FOOT REMOVAL DEEP IMPLANT;  Surgeon: [name], MD;  Location: [facility];  Service: Orthopedics;  Laterality: Left;  . HERNIA REPAIR     Inguinal Hernia Repair  .  SPINE SURGERY  2014    Family History  Problem Relation Age of Onset  . Arthritis Mother   . Hypertension Mother   . Macular degeneration Mother   . Hypertension Father   . Hyperlipidemia Father   . Heart disease Maternal Grandfather   . Diabetes Maternal Grandfather   . Kidney disease Paternal Grandmother    Social History:  reports that she has never smoked. She has never used smokeless tobacco. She reports  that she does not drink alcohol and does not use drugs.  Allergies:  Allergies  Allergen Reactions  . Sulfamethoxazole-Trimethoprim Itching  . Asa [Aspirin] Swelling  . Buspirone Other (See Comments)    Headaches    . Levofloxacin Other (See Comments)    "weakness"  . Lexapro [Escitalopram Oxalate] Other (See Comments)    Weakness   . Motrin [Ibuprofen] Swelling and Other (See Comments)  . Pollen Extract Hives  . Soy Allergy Other (See Comments)  . Ceftin [Cefuroxime Axetil] Rash  . Penicillins Rash  . Sulfa Antibiotics Rash    Medications:  Current Facility-Administered Medications on File Prior to Encounter  Medication Dose Route Frequency Provider Last Rate Last Admin  . 0.9 %  sodium chloride infusion   Intravenous Continuous Lequita Asal, MD 10 mL/hr at 04/25/16 0855 New Bag at 04/25/16 0855  . acetaminophen (TYLENOL) tablet 650 mg  650 mg Oral Once Lequita Asal, MD       Current Outpatient Medications on File Prior to Encounter  Medication Sig Dispense Refill  . acetaminophen (TYLENOL) 500 MG tablet Take 1,000 mg by mouth every 4 (four) hours as needed.    Marland Kitchen azelastine (ASTELIN) 0.1 % nasal spray Place 1 spray into both nostrils 2 (two) times daily. Use in each nostril as directed 30 mL 1  . Cholecalciferol (D3-1000 PO) Take 1,000 Units by mouth daily.    . Coenzyme Q10 (CO Q10) 100 MG CAPS Take 1 capsule by mouth daily.    Marland Kitchen dalfampridine 10 MG TB12 Take 10 mg by mouth every 12 (twelve) hours.    . DULoxetine (CYMBALTA) 60 MG capsule Take 60 mg by mouth daily.    . fexofenadine (ALLEGRA ALLERGY) 180 MG tablet Take 1 tablet (180 mg total) by mouth daily. 30 tablet 2  . fluticasone (FLONASE) 50 MCG/ACT nasal spray Place 1 spray into both nostrils daily. 16 g 2  . L-Methylfolate-Algae (DEPLIN 15 PO) Take 15 mg by mouth daily.    . Lactobacillus (PROBIOTIC ACIDOPHILUS PO) Take 1 capsule by mouth daily.    Marland Kitchen LORazepam (ATIVAN) 0.5 MG tablet Take 0.25 mg by  mouth at bedtime.    . Magnesium Oxide 400 (240 Mg) MG TABS TAKE ONE TABLET EVERY DAY 30 tablet 1  . mirtazapine (REMERON) 30 MG tablet Take 30 mg by mouth at bedtime.    Marland Kitchen omeprazole (PRILOSEC) 20 MG capsule Take 20 mg by mouth 2 (two) times daily before a meal.    . sodium chloride (OCEAN) 0.65 % nasal spray Place 1-2 sprays into the nose as needed.    . B Complex Vitamins (VITAMIN B COMPLEX PO) Take by mouth. (Patient not taking: No sig reported)    . Calcium-Phosphorus-Vitamin D (CITRACAL +D3 PO) Take by mouth. 2 tablets twice a day (Patient not taking: No sig reported)    . hydrocortisone (ANUSOL-HC) 25 MG suppository Place 1 suppository (25 mg total) rectally 2 (two) times daily. (Patient not taking: No sig reported) 12 suppository 0  .  hydrocortisone 2.5 % cream Apply topically 2 (two) times daily as needed (Rash). (Patient not taking: No sig reported) 30 g 11  . ondansetron (ZOFRAN ODT) 4 MG disintegrating tablet Take 1 tablet (4 mg total) by mouth every 8 (eight) hours as needed for nausea or vomiting. (Patient not taking: No sig reported) 30 tablet 0  . oxazepam (SERAX) 10 MG capsule Take 10 mg by mouth at bedtime as needed for sleep or anxiety. (Patient not taking: No sig reported)    . tiZANidine (ZANAFLEX) 4 MG tablet Take 1 tablet by mouth 3 (three) times daily as needed. (Patient not taking: No sig reported)    . traZODone (DESYREL) 50 MG tablet Take 50 mg by mouth at bedtime.  (Patient not taking: No sig reported)  12    Scheduled: .  stroke: mapping our early stages of recovery book   Does not apply Once  . dalfampridine  10 mg Oral Q12H  . DULoxetine  60 mg Oral Daily  . enoxaparin (LOVENOX) injection  0.5 mg/kg Subcutaneous Q24H  . fluticasone  1 spray Each Nare Daily  . LORazepam  0.25 mg Oral QHS  . mirtazapine  30 mg Oral Daily    ROS: As per HPI.   Physical Examination: Blood pressure (!) 148/74, pulse 90, temperature 98.5 F (36.9 C), temperature source Oral,  resp. rate 16, height 5\' 3"  (1.6 m), weight 83.5 kg, SpO2 98 %.  HEENT: [place]/AT Lungs: Respirations unlabored Ext: Bilateral pretibial pitting edema   Neurologic Examination: Mental Status: Awake and alert. Fully oriented. Thought content appropriate.  Speech fluent. Had some difficulty correctly naming a flashlight ("light"), but could name a thumb, pinky finger and pen.  Comprehension intact. Able to follow all commands without difficulty. No dysarthria.  Cranial Nerves: II:  Visual fields intact with no extinction to DSS. PERRL.  III,IV, VI: EOMI with saccadic quality to visual pursuits noted. No nystagmus. No ptosis.  V,VII: Smile symmetric, facial temp sensation decreased on the left.  VIII: Hearing intact to conversation.  IX,X: Palate rises symmetrically XI: Symmetric shoulder shrug XII: Midline tongue extension without atrophy or fasciculations Motor: RUE: 2/5 finger extension, 3/5 grip, 4-/5 biceps and triceps, 4/5 deltoid RLE: 4/5 hip flexion, 5/5 knee extension, 4/5 ADF and APF LUE: 5/5 proximally and distally LLE: 3/5 hip flexion, 4/5 knee extension, 4-/5 ADF and APF Positive for right pronator drift.  Increased tone to BLE that may be due to anxiety but does not resolve with encouragement Sensory: Temp and FT subjectively intact x 4. Positive for extinction to DSS on the left.  Deep Tendon Reflexes:  3+ bilateral brachioradialis, 2+ bilateral biceps Unable to elicit patellars due to increased tone 0 bilateral achilles Toes upgoing bilaterally Cerebellar: No ataxia with FNF on the left. Positive for ataxia with FNF on the right. Unable to perform H-S due to LLE pain and bilateral weakness.  Gait: Deferred   Results for orders placed or performed during the hospital encounter of 12/08/20 (from the past 48 hour(s))  Ethanol     Status: None   Collection Time: 12/08/20  7:43 PM  Result Value Ref Range   Alcohol, Ethyl (B) <10 <10 mg/dL    Comment: (NOTE) Lowest detectable  limit for serum alcohol is 10 mg/dL.  For medical purposes only. Performed at [facility], [address] [place], [place] [address]   CBC WITH DIFFERENTIAL     Status: None   Collection Time: 12/08/20  7:43 PM  Result  Value Ref Range   WBC 9.3 4.0 - 10.5 K/uL   RBC 4.87 3.87 - 5.11 MIL/uL   Hemoglobin 13.7 12.0 - 15.0 g/dL   HCT 41.3 36.0 - 46.0 %   MCV 84.8 80.0 - 100.0 fL   MCH 28.1 26.0 - 34.0 pg   MCHC 33.2 30.0 - 36.0 g/dL   RDW 14.6 11.5 - 15.5 %   Platelets 299 150 - 400 K/uL   nRBC 0.0 0.0 - 0.2 %   Neutrophils Relative % 75 %   Neutro Abs 7.1 1.7 - 7.7 K/uL   Lymphocytes Relative 18 %   Lymphs Abs 1.7 0.7 - 4.0 K/uL   Monocytes Relative 5 %   Monocytes Absolute 0.4 0.1 - 1.0 K/uL   Eosinophils Relative 1 %   Eosinophils Absolute 0.1 0.0 - 0.5 K/uL   Basophils Relative 1 %   Basophils Absolute 0.1 0.0 - 0.1 K/uL   Immature Granulocytes 0 %   Abs Immature Granulocytes 0.02 0.00 - 0.07 K/uL    Comment: Performed at [facility], [address] [place], [place] [address]  Comprehensive metabolic panel     Status: Abnormal   Collection Time: 12/08/20  7:43 PM  Result Value Ref Range   Sodium 138 135 - 145 mmol/L   Potassium 3.8 3.5 - 5.1 mmol/L   Chloride 104 98 - 111 mmol/L   CO2 23 22 - 32 mmol/L   Glucose, Bld 149 (H) 70 - 99 mg/dL    Comment: Glucose reference range applies only to samples taken after fasting for at least 8 hours.   BUN 11 8 - 23 mg/dL   Creatinine, Ser 0.69 0.44 - 1.00 mg/dL   Calcium 9.3 8.9 - 10.3 mg/dL   Total Protein 7.4 6.5 - 8.1 g/dL   Albumin 4.4 3.5 - 5.0 g/dL   AST 29 15 - 41 U/L   ALT 26 0 - 44 U/L   Alkaline Phosphatase 100 38 - 126 U/L   Total Bilirubin 0.7 0.3 - 1.2 mg/dL   GFR, Estimated >60 >60 mL/min    Comment: (NOTE) Calculated using the CKD-EPI Creatinine Equation (2021)    Anion gap 11 5 - 15    Comment: Performed at [facility], [place], [place] [address]  Troponin  I (High Sensitivity)     Status: None   Collection Time: 12/08/20  7:43 PM  Result Value Ref Range   Troponin I (High Sensitivity) 5 <18 ng/L    Comment: (NOTE) Elevated high sensitivity troponin I (hsTnI) values and significant  changes across serial measurements may suggest ACS but many other  chronic and acute conditions are known to elevate hsTnI results.  Refer to the "Links" section for chest pain algorithms and additional  guidance. Performed at [facility], [place], [place] [address]   Resp Panel by RT-PCR (Flu A&B, Covid) Nasopharyngeal Swab     Status: None   Collection Time: 12/08/20  8:11 PM   Specimen: Nasopharyngeal Swab; Nasopharyngeal(NP) swabs in vial transport medium  Result Value Ref Range   SARS Coronavirus 2 by RT PCR NEGATIVE NEGATIVE    Comment: (NOTE) SARS-CoV-2 target nucleic acids are NOT DETECTED.  The SARS-CoV-2 RNA is generally detectable in upper respiratory specimens during the acute phase of infection. The lowest concentration of SARS-CoV-2 viral copies this assay can detect is 138 copies/mL. A negative result does not preclude SARS-Cov-2 infection and should not be used as the  sole basis for treatment or other patient management decisions. A negative result may occur with  improper specimen collection/handling, submission of specimen other than nasopharyngeal swab, presence of viral mutation(s) within the areas targeted by this assay, and inadequate number of viral copies(<138 copies/mL). A negative result must be combined with clinical observations, patient history, and epidemiological information. The expected result is Negative.  Fact Sheet for Patients:  EntrepreneurPulse.com.au  Fact Sheet for Healthcare Providers:  IncredibleEmployment.be  This test is no t yet approved or cleared by the [place] FDA and  has been authorized for detection and/or diagnosis of SARS-CoV-2  by FDA under an Emergency Use Authorization (EUA). This EUA will remain  in effect (meaning this test can be used) for the duration of the COVID-19 declaration under Section 564(b)(1) of the Act, 21 U.S.C.section 360bbb-3(b)(1), unless the authorization is terminated  or revoked sooner.       Influenza A by PCR NEGATIVE NEGATIVE   Influenza B by PCR NEGATIVE NEGATIVE    Comment: (NOTE) The Xpert Xpress SARS-CoV-2/FLU/RSV plus assay is intended as an aid in the diagnosis of influenza from Nasopharyngeal swab specimens and should not be used as a sole basis for treatment. Nasal washings and aspirates are unacceptable for Xpert Xpress SARS-CoV-2/FLU/RSV testing.  Fact Sheet for Patients: EntrepreneurPulse.com.au  Fact Sheet for Healthcare Providers: IncredibleEmployment.be  This test is not yet approved or cleared by the [place] FDA and has been authorized for detection and/or diagnosis of SARS-CoV-2 by FDA under an Emergency Use Authorization (EUA). This EUA will remain in effect (meaning this test can be used) for the duration of the COVID-19 declaration under Section 564(b)(1) of the Act, 21 U.S.C. section 360bbb-3(b)(1), unless the authorization is terminated or revoked.  Performed at [facility], [address] [place], [place] [address]   CBG monitoring, ED     Status: Abnormal   Collection Time: 12/08/20  8:41 PM  Result Value Ref Range   Glucose-Capillary 116 (H) 70 - 99 mg/dL    Comment: Glucose reference range applies only to samples taken after fasting for at least 8 hours.  Urine Drug Screen, Qualitative     Status: None   Collection Time: 12/09/20  2:16 AM  Result Value Ref Range   Tricyclic, Ur Screen NONE DETECTED NONE DETECTED   Amphetamines, Ur Screen NONE DETECTED NONE DETECTED   MDMA (Ecstasy)Ur Screen NONE DETECTED NONE DETECTED   Cocaine Metabolite,Ur [place] NONE DETECTED NONE DETECTED   Opiate, Ur  Screen NONE DETECTED NONE DETECTED   Phencyclidine (PCP) Ur S NONE DETECTED NONE DETECTED   Cannabinoid 50 Ng, Ur [place] NONE DETECTED NONE DETECTED   Barbiturates, Ur Screen NONE DETECTED NONE DETECTED   Benzodiazepine, Ur Scrn NONE DETECTED NONE DETECTED   Methadone Scn, Ur NONE DETECTED NONE DETECTED    Comment: (NOTE) Tricyclics + metabolites, urine    Cutoff 1000 ng/mL Amphetamines + metabolites, urine  Cutoff 1000 ng/mL MDMA (Ecstasy), urine              Cutoff 500 ng/mL Cocaine Metabolite, urine          Cutoff 300 ng/mL Opiate + metabolites, urine        Cutoff 300 ng/mL Phencyclidine (PCP), urine         Cutoff 25 ng/mL Cannabinoid, urine                 Cutoff 50 ng/mL Barbiturates + metabolites, urine  Cutoff 200 ng/mL Benzodiazepine, urine  Cutoff 200 ng/mL Methadone, urine                   Cutoff 300 ng/mL  The urine drug screen provides only a preliminary, unconfirmed analytical test result and should not be used for non-medical purposes. Clinical consideration and professional judgment should be applied to any positive drug screen result due to possible interfering substances. A more specific alternate chemical method must be used in order to obtain a confirmed analytical result. Gas chromatography / mass spectrometry (GC/MS) is the preferred confirm atory method. Performed at [facility], [address] [place], [place] [address]   Urinalysis, Routine w reflex microscopic Urine, Clean Catch     Status: Abnormal   Collection Time: 12/09/20  2:16 AM  Result Value Ref Range   Color, Urine YELLOW (A) YELLOW   APPearance CLEAR (A) CLEAR   Specific Gravity, Urine 1.014 1.005 - 1.030   pH 5.0 5.0 - 8.0   Glucose, UA NEGATIVE NEGATIVE mg/dL   Hgb urine dipstick NEGATIVE NEGATIVE   Bilirubin Urine NEGATIVE NEGATIVE   Ketones, ur NEGATIVE NEGATIVE mg/dL   Protein, ur NEGATIVE NEGATIVE mg/dL   Nitrite NEGATIVE NEGATIVE   Leukocytes,Ua NEGATIVE  NEGATIVE   RBC / HPF 0-5 0 - 5 RBC/hpf   WBC, UA NONE SEEN 0 - 5 WBC/hpf   Bacteria, UA NONE SEEN NONE SEEN   Squamous Epithelial / LPF 0-5 0 - 5    Comment: Performed at [facility], [place], [place] [address]  Troponin I (High Sensitivity)     Status: None   Collection Time: 12/09/20  2:16 AM  Result Value Ref Range   Troponin I (High Sensitivity) 5 <18 ng/L    Comment: (NOTE) Elevated high sensitivity troponin I (hsTnI) values and significant  changes across serial measurements may suggest ACS but many other  chronic and acute conditions are known to elevate hsTnI results.  Refer to the "Links" section for chest pain algorithms and additional  guidance. Performed at [facility], [address] [place], [place] [address]   TSH     Status: None   Collection Time: 12/09/20  2:16 AM  Result Value Ref Range   TSH 2.522 0.350 - 4.500 uIU/mL    Comment: Performed by a 3rd Generation assay with a functional sensitivity of <=0.01 uIU/mL. Performed at [facility], [address], [place], [place] [address]   Magnesium     Status: None   Collection Time: 12/09/20  2:16 AM  Result Value Ref Range   Magnesium 2.3 1.7 - 2.4 mg/dL    Comment: Performed at [facility], [address] [place], [place] [address]  Phosphorus     Status: None   Collection Time: 12/09/20  2:16 AM  Result Value Ref Range   Phosphorus 3.5 2.5 - 4.6 mg/dL    Comment: Performed at [facility], [address] [place], [place] [address]  Lipid panel     Status: Abnormal   Collection Time: 12/09/20  5:34 AM  Result Value Ref Range   Cholesterol 243 (H) 0 - 200 mg/dL   Triglycerides 141 <150 mg/dL   HDL 72 >40 mg/dL   Total CHOL/HDL Ratio 3.4 RATIO   VLDL 28 0 - 40 mg/dL   LDL Cholesterol 143 (H) 0 - 99 mg/dL    Comment:        Total Cholesterol/HDL:CHD Risk Coronary Heart Disease Risk Table  Men   Women  1/2 Average  Risk   3.4   3.3  Average Risk       5.0   4.4  2 X Average Risk   9.6   7.1  3 X Average Risk  23.4   11.0        Use the calculated Patient Ratio above and the CHD Risk Table to determine the patient's CHD Risk.        ATP III CLASSIFICATION (LDL):  <100     mg/dL   Optimal  100-129  mg/dL   Near or Above                    Optimal  130-159  mg/dL   Borderline  160-189  mg/dL   High  >190     mg/dL   Very High Performed at [facility], [address] [place], [place] [address]    CT HEAD WO CONTRAST  Result Date: 12/08/2020 CLINICAL DATA:  Arm weakness. EXAM: CT HEAD WITHOUT CONTRAST TECHNIQUE: Contiguous axial images were obtained from the base of the skull through the vertex without intravenous contrast. COMPARISON:  December 21, 2017. FINDINGS: Brain: Mild diffuse cortical atrophy is noted. Moderate chronic ischemic white matter disease is noted. No mass effect or midline shift is noted. Ventricular size is within normal limits. There is no evidence of mass lesion, hemorrhage or acute infarction. Vascular: No hyperdense vessel or unexpected calcification. Skull: Normal. Negative for fracture or focal lesion. Sinuses/Orbits: No acute finding. Other: None. IMPRESSION: Mild diffuse cortical atrophy. Moderate chronic ischemic white matter disease. No acute intracranial abnormality seen. Electronically Signed   By: Marijo Conception M.D.   On: 12/08/2020 21:25   MR ANGIO HEAD WO CONTRAST  Result Date: 12/09/2020 CLINICAL DATA:  Multiple sclerosis.  TIA.  Memory disturbance. EXAM: MRI HEAD WITHOUT AND WITH CONTRAST MRA HEAD WITHOUT CONTRAST TECHNIQUE: Multiplanar, multiecho pulse sequences of the brain and surrounding structures were obtained without and with intravenous contrast. Angiographic images of the head were obtained using MRA technique without contrast. CONTRAST:  7.38mL GADAVIST GADOBUTROL 1 MMOL/ML IV SOLN COMPARISON:  Head CT earlier tonight.  MRI 03/12/2016 FINDINGS: MRI  HEAD FINDINGS Brain: Diffusion imaging shows a somewhat linear region restricted diffusion in the deep white matter of the left hemisphere in the corona radiography. The appearance is most suggestive of a white matter infarction. Acute multiple sclerosis lesions usually have a more round configuration. Abnormal T2 signal in the pons is stable since previous studies. Cerebral hemispheres show old small vessel infarctions of the thalami and basal ganglia. There is confluent abnormal T2 and FLAIR signal throughout white matter which could be due to a combination of small-vessel disease and chronic demyelinating disease. The majority of these findings appear similar to the study of 2017. There is been slightly more central volume loss with the ventricles being slightly larger. No mass, hemorrhage or extra-axial collection. There are foci of hemosiderin deposition associated with some of the old white matter insults. After contrast administration, no abnormal enhancement occurs. Vascular: Major vessels at the base of the brain show flow. Skull and upper cervical spine: Negative Sinuses/Orbits: Clear/normal Other: None MRA HEAD FINDINGS Both internal carotid arteries are patent through the skull base and siphon regions. Right internal carotid artery supplies the right middle cerebral artery territory, both anterior cerebral artery territories and large posterior communicating artery. Left internal carotid artery supplies only the middle cerebral artery territory. No proximal MCA  stenosis on either side. More distal branch vessels do show atherosclerotic narrowing and irregularity. Both vertebral arteries are patent to the basilar. No basilar stenosis. Posterior circulation branch vessels show flow. There is considerable atherosclerotic irregularity in the PCA branches. IMPRESSION: 1. Acute white matter infarction in the deep white matter of the left corona radiata region. The appearance is much more consistent with a  white matter infarction than an acute MS plaque. No mass effect or acute hemorrhage. 2. Extensive abnormal T2 and FLAIR signal throughout the brain as outlined above, similar to the study of 2017. The white matter disease could be due to a combination of small-vessel disease and chronic demyelinating disease. There is probably slight increase in central volume loss with the ventricles being slightly more prominent. 3. MR angiography does not show any correctable proximal stenosis or large vessel occlusion. Distal vessel atherosclerotic irregularity diffusely. This is most severe in the PCA branches. Electronically Signed   By: Nelson Chimes M.D.   On: 12/09/2020 01:23   MR Brain W and Wo Contrast  Result Date: 12/09/2020 CLINICAL DATA:  Multiple sclerosis.  TIA.  Memory disturbance. EXAM: MRI HEAD WITHOUT AND WITH CONTRAST MRA HEAD WITHOUT CONTRAST TECHNIQUE: Multiplanar, multiecho pulse sequences of the brain and surrounding structures were obtained without and with intravenous contrast. Angiographic images of the head were obtained using MRA technique without contrast. CONTRAST:  7.42mL GADAVIST GADOBUTROL 1 MMOL/ML IV SOLN COMPARISON:  Head CT earlier tonight.  MRI 03/12/2016 FINDINGS: MRI HEAD FINDINGS Brain: Diffusion imaging shows a somewhat linear region restricted diffusion in the deep white matter of the left hemisphere in the corona radiography. The appearance is most suggestive of a white matter infarction. Acute multiple sclerosis lesions usually have a more round configuration. Abnormal T2 signal in the pons is stable since previous studies. Cerebral hemispheres show old small vessel infarctions of the thalami and basal ganglia. There is confluent abnormal T2 and FLAIR signal throughout white matter which could be due to a combination of small-vessel disease and chronic demyelinating disease. The majority of these findings appear similar to the study of 2017. There is been slightly more central volume  loss with the ventricles being slightly larger. No mass, hemorrhage or extra-axial collection. There are foci of hemosiderin deposition associated with some of the old white matter insults. After contrast administration, no abnormal enhancement occurs. Vascular: Major vessels at the base of the brain show flow. Skull and upper cervical spine: Negative Sinuses/Orbits: Clear/normal Other: None MRA HEAD FINDINGS Both internal carotid arteries are patent through the skull base and siphon regions. Right internal carotid artery supplies the right middle cerebral artery territory, both anterior cerebral artery territories and large posterior communicating artery. Left internal carotid artery supplies only the middle cerebral artery territory. No proximal MCA stenosis on either side. More distal branch vessels do show atherosclerotic narrowing and irregularity. Both vertebral arteries are patent to the basilar. No basilar stenosis. Posterior circulation branch vessels show flow. There is considerable atherosclerotic irregularity in the PCA branches. IMPRESSION: 1. Acute white matter infarction in the deep white matter of the left corona radiata region. The appearance is much more consistent with a white matter infarction than an acute MS plaque. No mass effect or acute hemorrhage. 2. Extensive abnormal T2 and FLAIR signal throughout the brain as outlined above, similar to the study of 2017. The white matter disease could be due to a combination of small-vessel disease and chronic demyelinating disease. There is probably slight increase in central volume  loss with the ventricles being slightly more prominent. 3. MR angiography does not show any correctable proximal stenosis or large vessel occlusion. Distal vessel atherosclerotic irregularity diffusely. This is most severe in the PCA branches. Electronically Signed   By: Nelson Chimes M.D.   On: 12/09/2020 01:23   DG Chest Portable 1 View  Result Date: 12/08/2020 CLINICAL  DATA:  Weakness EXAM: PORTABLE CHEST 1 VIEW COMPARISON:  December 20, 2017 FINDINGS: The heart size and mediastinal contours are within normal limits. Aortic knob calcifications. Both lungs are clear. The visualized skeletal structures are unremarkable. IMPRESSION: No active disease. Electronically Signed   By: Prudencio Pair M.D.   On: 12/08/2020 21:17    Assessment: 70 y.o. female with a PMHx of multiple sclerosis presenting with an acute left corona radiata and periventricular white matter ischemic infarction. The infarction is in a small vessel distribution. 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  1. Exam reveals findings consistent with the diffuse white matter disease on MRI. Her RUE weakness is referable to the acute deep white matter acute infarction seen on MRI.  2. MRI brain: Acute white matter infarction in the deep white matter of the left corona radiata region. The appearance is much more consistent with a white matter infarction than an acute MS plaque. No mass effect or acute hemorrhage. A background of extensive abnormal T2 and FLAIR signal throughout the brain is noted, similar to the study of 2017. The white matter disease could be due to a combination of small-vessel disease and chronic demyelinating disease. There is probably slight increase in central volume loss with the ventricles being slightly more Prominent. 3. MRA head: Distal vessel atherosclerotic irregularity is present diffusely. This is most severe in the PCA branches. 4. EKG shows no atrial fibrillation.  5. Stroke Risk Factors - Patient age.   6. The patient has a listed allergy to aspirin.   Recommendations:  Stroke workup and management: 1. HgbA1c, fasting lipid panel 2. Carotid ultrasound 3. TTE 4. Cardiac telemetry 5. PT consult, OT consult, Speech consult 6. Start Plavix 75  mg po qd.  7. Atorvastatin 40 mg po qd. Obtain baseline CK level 8. Risk factor modification to include regular light exercise regimen as outpatient. Will need PT input for safety measures given her BLE weakness with increased risk for falls.  9. BP monitoring and management if necessary.  10. Frequent neuro checks  Multiple Sclerosis: 1. Presentation is not consistent with an exacerbation as stroke is seen on MRI 2. Will need outpatient follow up with her regular Neurologist   @Electronically  signed: Dr. Kerney Elbe  12/09/2020, 8:55 AM

## 2020-12-09 NOTE — ED Notes (Signed)
Pt placement called for bed status, left message

## 2020-12-09 NOTE — Progress Notes (Signed)
PROGRESS NOTE    Carly [name]  XLK:[id number] DOB: [birth year] DOA: 12/08/2020 PCP: Einar Pheasant, MD   Brief Narrative:  This 70 years old female with PMH significant for MS, HTN, memory problems, degenerative disc disease, s/p lumbar fusion, depression and anxiety, history of adenomatous polyps, GERD, migraine who presented in the emergency department with 1 day history of generalized weakness.  Patient reports while doing exercise she noticed decreased grip strength in the right hand and she was having trouble picking up things with his right hand she felt dizzy and felt that she is about to pass out.  Patient was admitted for possible stroke versus MS flare.  Neurology consulted, recommended CT head and MRI.  MRI consistent with white matter infarction.  Assessment & Plan:   Principal Problem:   Acute focal neurological deficit Active Problems:   Multiple sclerosis (HCC)   BP (high blood pressure)   Depression with anxiety   Acute focal neurologic deficit >> Improving Patient presents with intermittent weakness,  poor right hand grip, symptoms somewhat improved in ER. It could be TIA or MS flareup. Teleneurology recommend stroke and metabolic work-up and MRI to evaluate for possible MS flare worsening lesions. Neurology consulted,  awaiting recommendation. TSH, mag, Phos,  her initial laboratory results were within normal limits. Continue home Dalfamprimine for MS MRI brain consistent with white matter infarction. Allow for permissive HTN  Will hold off on antiplatelets, statins, echo, carotid evaluation pending work-up (MRI) -Hb A1C 5.7 - Lipid panel : LDL 143 - Tele monitoring  - SLP eval - PT/OT  Hypertension - Permissive hypertension for now as above.  Anxiety/depression - Continue home Cymbalta, Ativan, mirtazapine. -Patient seems very sad depressed and anxious. -Denies any suicidal homicidal ideations. -Psych consulted for evaluation.   DVT  prophylaxis: Lovenox Code Status:Full code. Family Communication: Husband at bed side. Disposition Plan:   Status is: Observation  The patient remains OBS appropriate and will d/c before 2 midnights.  Dispo: The patient is from: Home              Anticipated d/c is to: Home              Patient currently is not medically stable to d/c.   Difficult to place patient No   Consultants:    Neurology  Procedures:  CT head, MRI Brain Antimicrobials:  Anti-infectives (From admission, onward)   None     Subjective: Patient was seen and examined at bedside.  Overnight events noted. Patient reports having intermittent weakness which has been resolved.  She seems more depressed and anxious.  Objective: Vitals:   12/09/20 0242 12/09/20 0523 12/09/20 0759 12/09/20 1109  BP: (!) 162/90 139/87 (!) 148/74 (!) 154/75  Pulse: 82 85 90 85  Resp: 20 20 16 16   Temp:  97.8 F (36.6 C) 98.5 F (36.9 C) 98.5 F (36.9 C)  TempSrc:  Oral Oral Oral  SpO2: 95% 99% 98% 98%  Weight:      Height:        Intake/Output Summary (Last 24 hours) at 12/09/2020 1449 Last data filed at 12/09/2020 1330 Gross per 24 hour  Intake 360 ml  Output --  Net 360 ml   Filed Weights   12/08/20 1941  Weight: 83.5 kg    Examination:  General exam: Appears calm and comfortable, not in acute distress. Respiratory system: Clear to auscultation. Respiratory effort normal. Cardiovascular system: S1 & S2 heard, RRR. No JVD, murmurs, rubs, gallops or clicks. No  pedal edema. Gastrointestinal system: Abdomen is nondistended, soft and nontender. No organomegaly or masses felt. Normal bowel sounds heard. Central nervous system: Alert and oriented. No focal neurological deficits. Extremities: Symmetric 5 x 5 power.  No edema, no cyanosis, no clubbing. Skin: No rashes, lesions or ulcers Psychiatry: Judgement and insight appear normal. Mood & affect appropriate.     Data Reviewed: I have personally reviewed  following labs and imaging studies  CBC: Recent Labs  Lab 12/08/20 1943  WBC 9.3  NEUTROABS 7.1  HGB 13.7  HCT 41.3  MCV 84.8  PLT 811   Basic Metabolic Panel: Recent Labs  Lab 12/08/20 1943 12/09/20 0216  NA 138  --   K 3.8  --   CL 104  --   CO2 23  --   GLUCOSE 149*  --   BUN 11  --   CREATININE 0.69  --   CALCIUM 9.3  --   MG  --  2.3  PHOS  --  3.5   GFR: Estimated Creatinine Clearance: 66.9 mL/min (by C-G formula based on SCr of 0.69 mg/dL). Liver Function Tests: Recent Labs  Lab 12/08/20 1943  AST 29  ALT 26  ALKPHOS 100  BILITOT 0.7  PROT 7.4  ALBUMIN 4.4   No results for input(s): LIPASE, AMYLASE in the last 168 hours. No results for input(s): AMMONIA in the last 168 hours. Coagulation Profile: No results for input(s): INR, PROTIME in the last 168 hours. Cardiac Enzymes: No results for input(s): CKTOTAL, CKMB, CKMBINDEX, TROPONINI in the last 168 hours. BNP (last 3 results) No results for input(s): PROBNP in the last 8760 hours. HbA1C: Recent Labs    12/09/20 0534  HGBA1C 5.7*   CBG: Recent Labs  Lab 12/08/20 2041  GLUCAP 116*   Lipid Profile: Recent Labs    12/09/20 0534  CHOL 243*  HDL 72  LDLCALC 143*  TRIG 141  CHOLHDL 3.4   Thyroid Function Tests: Recent Labs    12/09/20 0216  TSH 2.522   Anemia Panel: No results for input(s): VITAMINB12, FOLATE, FERRITIN, TIBC, IRON, RETICCTPCT in the last 72 hours. Sepsis Labs: No results for input(s): PROCALCITON, LATICACIDVEN in the last 168 hours.  Recent Results (from the past 240 hour(s))  Resp Panel by RT-PCR (Flu A&B, Covid) Nasopharyngeal Swab     Status: None   Collection Time: 12/08/20  8:11 PM   Specimen: Nasopharyngeal Swab; Nasopharyngeal(NP) swabs in vial transport medium  Result Value Ref Range Status   SARS Coronavirus 2 by RT PCR NEGATIVE NEGATIVE Final    Comment: (NOTE) SARS-CoV-2 target nucleic acids are NOT DETECTED.  The SARS-CoV-2 RNA is generally  detectable in upper respiratory specimens during the acute phase of infection. The lowest concentration of SARS-CoV-2 viral copies this assay can detect is 138 copies/mL. A negative result does not preclude SARS-Cov-2 infection and should not be used as the sole basis for treatment or other patient management decisions. A negative result may occur with  improper specimen collection/handling, submission of specimen other than nasopharyngeal swab, presence of viral mutation(s) within the areas targeted by this assay, and inadequate number of viral copies(<138 copies/mL). A negative result must be combined with clinical observations, patient history, and epidemiological information. The expected result is Negative.  Fact Sheet for Patients:  EntrepreneurPulse.com.au  Fact Sheet for Healthcare Providers:  IncredibleEmployment.be  This test is no t yet approved or cleared by the [place] FDA and  has been authorized for detection and/or diagnosis of  SARS-CoV-2 by FDA under an Emergency Use Authorization (EUA). This EUA will remain  in effect (meaning this test can be used) for the duration of the COVID-19 declaration under Section 564(b)(1) of the Act, 21 U.S.C.section 360bbb-3(b)(1), unless the authorization is terminated  or revoked sooner.       Influenza A by PCR NEGATIVE NEGATIVE Final   Influenza B by PCR NEGATIVE NEGATIVE Final    Comment: (NOTE) The Xpert Xpress SARS-CoV-2/FLU/RSV plus assay is intended as an aid in the diagnosis of influenza from Nasopharyngeal swab specimens and should not be used as a sole basis for treatment. Nasal washings and aspirates are unacceptable for Xpert Xpress SARS-CoV-2/FLU/RSV testing.  Fact Sheet for Patients: EntrepreneurPulse.com.au  Fact Sheet for Healthcare Providers: IncredibleEmployment.be  This test is not yet approved or cleared by the [place] FDA  and has been authorized for detection and/or diagnosis of SARS-CoV-2 by FDA under an Emergency Use Authorization (EUA). This EUA will remain in effect (meaning this test can be used) for the duration of the COVID-19 declaration under Section 564(b)(1) of the Act, 21 U.S.C. section 360bbb-3(b)(1), unless the authorization is terminated or revoked.  Performed at [facility], [address], [place], [place] [address]     Radiology Studies: CT HEAD WO CONTRAST  Result Date: 12/08/2020 CLINICAL DATA:  Arm weakness. EXAM: CT HEAD WITHOUT CONTRAST TECHNIQUE: Contiguous axial images were obtained from the base of the skull through the vertex without intravenous contrast. COMPARISON:  December 21, 2017. FINDINGS: Brain: Mild diffuse cortical atrophy is noted. Moderate chronic ischemic white matter disease is noted. No mass effect or midline shift is noted. Ventricular size is within normal limits. There is no evidence of mass lesion, hemorrhage or acute infarction. Vascular: No hyperdense vessel or unexpected calcification. Skull: Normal. Negative for fracture or focal lesion. Sinuses/Orbits: No acute finding. Other: None. IMPRESSION: Mild diffuse cortical atrophy. Moderate chronic ischemic white matter disease. No acute intracranial abnormality seen. Electronically Signed   By: Marijo Conception M.D.   On: 12/08/2020 21:25   MR ANGIO HEAD WO CONTRAST  Result Date: 12/09/2020 CLINICAL DATA:  Multiple sclerosis.  TIA.  Memory disturbance. EXAM: MRI HEAD WITHOUT AND WITH CONTRAST MRA HEAD WITHOUT CONTRAST TECHNIQUE: Multiplanar, multiecho pulse sequences of the brain and surrounding structures were obtained without and with intravenous contrast. Angiographic images of the head were obtained using MRA technique without contrast. CONTRAST:  7.46mL GADAVIST GADOBUTROL 1 MMOL/ML IV SOLN COMPARISON:  Head CT earlier tonight.  MRI 03/12/2016 FINDINGS: MRI HEAD FINDINGS Brain: Diffusion imaging shows a  somewhat linear region restricted diffusion in the deep white matter of the left hemisphere in the corona radiography. The appearance is most suggestive of a white matter infarction. Acute multiple sclerosis lesions usually have a more round configuration. Abnormal T2 signal in the pons is stable since previous studies. Cerebral hemispheres show old small vessel infarctions of the thalami and basal ganglia. There is confluent abnormal T2 and FLAIR signal throughout white matter which could be due to a combination of small-vessel disease and chronic demyelinating disease. The majority of these findings appear similar to the study of 2017. There is been slightly more central volume loss with the ventricles being slightly larger. No mass, hemorrhage or extra-axial collection. There are foci of hemosiderin deposition associated with some of the old white matter insults. After contrast administration, no abnormal enhancement occurs. Vascular: Major vessels at the base of the brain show flow. Skull and upper cervical spine: Negative Sinuses/Orbits:  Clear/normal Other: None MRA HEAD FINDINGS Both internal carotid arteries are patent through the skull base and siphon regions. Right internal carotid artery supplies the right middle cerebral artery territory, both anterior cerebral artery territories and large posterior communicating artery. Left internal carotid artery supplies only the middle cerebral artery territory. No proximal MCA stenosis on either side. More distal branch vessels do show atherosclerotic narrowing and irregularity. Both vertebral arteries are patent to the basilar. No basilar stenosis. Posterior circulation branch vessels show flow. There is considerable atherosclerotic irregularity in the PCA branches. IMPRESSION: 1. Acute white matter infarction in the deep white matter of the left corona radiata region. The appearance is much more consistent with a white matter infarction than an acute MS plaque. No  mass effect or acute hemorrhage. 2. Extensive abnormal T2 and FLAIR signal throughout the brain as outlined above, similar to the study of 2017. The white matter disease could be due to a combination of small-vessel disease and chronic demyelinating disease. There is probably slight increase in central volume loss with the ventricles being slightly more prominent. 3. MR angiography does not show any correctable proximal stenosis or large vessel occlusion. Distal vessel atherosclerotic irregularity diffusely. This is most severe in the PCA branches. Electronically Signed   By: Nelson Chimes M.D.   On: 12/09/2020 01:23   MR Brain W and Wo Contrast  Result Date: 12/09/2020 CLINICAL DATA:  Multiple sclerosis.  TIA.  Memory disturbance. EXAM: MRI HEAD WITHOUT AND WITH CONTRAST MRA HEAD WITHOUT CONTRAST TECHNIQUE: Multiplanar, multiecho pulse sequences of the brain and surrounding structures were obtained without and with intravenous contrast. Angiographic images of the head were obtained using MRA technique without contrast. CONTRAST:  7.76mL GADAVIST GADOBUTROL 1 MMOL/ML IV SOLN COMPARISON:  Head CT earlier tonight.  MRI 03/12/2016 FINDINGS: MRI HEAD FINDINGS Brain: Diffusion imaging shows a somewhat linear region restricted diffusion in the deep white matter of the left hemisphere in the corona radiography. The appearance is most suggestive of a white matter infarction. Acute multiple sclerosis lesions usually have a more round configuration. Abnormal T2 signal in the pons is stable since previous studies. Cerebral hemispheres show old small vessel infarctions of the thalami and basal ganglia. There is confluent abnormal T2 and FLAIR signal throughout white matter which could be due to a combination of small-vessel disease and chronic demyelinating disease. The majority of these findings appear similar to the study of 2017. There is been slightly more central volume loss with the ventricles being slightly larger. No  mass, hemorrhage or extra-axial collection. There are foci of hemosiderin deposition associated with some of the old white matter insults. After contrast administration, no abnormal enhancement occurs. Vascular: Major vessels at the base of the brain show flow. Skull and upper cervical spine: Negative Sinuses/Orbits: Clear/normal Other: None MRA HEAD FINDINGS Both internal carotid arteries are patent through the skull base and siphon regions. Right internal carotid artery supplies the right middle cerebral artery territory, both anterior cerebral artery territories and large posterior communicating artery. Left internal carotid artery supplies only the middle cerebral artery territory. No proximal MCA stenosis on either side. More distal branch vessels do show atherosclerotic narrowing and irregularity. Both vertebral arteries are patent to the basilar. No basilar stenosis. Posterior circulation branch vessels show flow. There is considerable atherosclerotic irregularity in the PCA branches. IMPRESSION: 1. Acute white matter infarction in the deep white matter of the left corona radiata region. The appearance is much more consistent with a white matter infarction than  an acute MS plaque. No mass effect or acute hemorrhage. 2. Extensive abnormal T2 and FLAIR signal throughout the brain as outlined above, similar to the study of 2017. The white matter disease could be due to a combination of small-vessel disease and chronic demyelinating disease. There is probably slight increase in central volume loss with the ventricles being slightly more prominent. 3. MR angiography does not show any correctable proximal stenosis or large vessel occlusion. Distal vessel atherosclerotic irregularity diffusely. This is most severe in the PCA branches. Electronically Signed   By: Nelson Chimes M.D.   On: 12/09/2020 01:23   DG Chest Portable 1 View  Result Date: 12/08/2020 CLINICAL DATA:  Weakness EXAM: PORTABLE CHEST 1 VIEW  COMPARISON:  December 20, 2017 FINDINGS: The heart size and mediastinal contours are within normal limits. Aortic knob calcifications. Both lungs are clear. The visualized skeletal structures are unremarkable. IMPRESSION: No active disease. Electronically Signed   By: Prudencio Pair M.D.   On: 12/08/2020 21:17    Scheduled Meds: .  stroke: mapping our early stages of recovery book   Does not apply Once  . dalfampridine  10 mg Oral Q12H  . DULoxetine  60 mg Oral Daily  . enoxaparin (LOVENOX) injection  0.5 mg/kg Subcutaneous Q24H  . fluticasone  1 spray Each Nare Daily  . LORazepam  0.25 mg Oral QHS  . mirtazapine  30 mg Oral Daily   Continuous Infusions:   LOS: 0 days    Time spent: 35 mins    Thales Knipple, MD [facility]   If 7PM-7AM, please contact night-coverage

## 2020-12-09 NOTE — Evaluation (Signed)
Physical Therapy Evaluation Patient Details Name: Carly [name] MRN: [id number] DOB: Apr 04, 1951 Today's Date: 12/09/2020   History of Present Illness  Pt is a 70 y/o F admitted from home on 12/08/20 with c/c of weakness. At home while exercising pt felt bilateral weakness, especially in grip strength R>L & felt as though she would pass out & laid down on floor. Pt admitted for workup for TIA vs CVA vs MS flare up. 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 PMH: MS, HTN, memory changes, DDD s/p lumbar fusion, depression & anxiety, adenomatous polyps, GERD, migraine, aspiration pneumonia, hx of chicken pox    Clinical Impression  Pt seen for PT evaluation. Pt presents with R hemiplegia which impacts mobility. Pt requires max assist for bed mobility with hospital bed features & max assist +2 for stand pivot transfer with RW. Pt demonstrates impaired core/trunk control that affects sitting & standing balance and is also impacted by preexisting diagnosis of MS that primarily affects LLE. Pt is very eager to physically improve & would benefit from intensive therapies in CIR setting to maximize independence with mobility & reduce fall risk prior to return home. Will continue to see pt acutely to progress transfers & gait, as well as for NMR of RUE/RLE.   PT educated pt on strokes & stroke recovery.     Follow Up Recommendations CIR;Supervision for mobility/OOB    Equipment Recommendations   (TBD in next venue)    Recommendations for Other Services Rehab consult;OT consult     Precautions / Restrictions Precautions Precautions: Fall Precaution Comments: R hemi Restrictions Weight Bearing Restrictions: No      Mobility  Bed Mobility Overal bed mobility: Needs Assistance Bed Mobility: Supine to Sit     Supine to sit: Max assist;HOB elevated     General bed mobility comments: significantly extra time to complete movement, pt is  able to initiate supine>sit but ultimately requires max assist to move BLE to EOB & upright trunk to sit EOB    Transfers Overall transfer level: Needs assistance Equipment used: Rolling walker (2 wheeled) Transfers: Sit to/from [facility] Sit to Stand: Mod assist;From elevated surface (max cuing for safe hand placement & to push to standing but poor carryover during session, max cuing for hip extensor & glute activation & anterior pelvic shift) Stand pivot transfers: Max assist;+2 safety/equipment       General transfer comment: stand pivot bed>recliner on R with difficulty weight shifting L for increased ease of R foot clearance to step, pt with B hip/knee flexion and slowly starts to move lower despite max cuing for increased knee flexion & upright posture, ultimately requires +2 assist to safely sit in recliner seat  Ambulation/Gait                Stairs            Wheelchair Mobility    Modified Rankin (Stroke Patients Only)       Balance Overall balance assessment: Needs assistance Sitting-balance support: Bilateral upper extremity supported;Feet supported Sitting balance-Leahy Scale: Poor Sitting balance - Comments: up to max assist for static sitting balance, pt does endorse poor core strength at baseline Postural control: Posterior lean;Right lateral lean Standing balance support: Bilateral upper extremity supported;During functional activity Standing balance-Leahy Scale: Zero Standing balance comment: max assist +2 for stand pivot to recliner  Pertinent Vitals/Pain Pain Assessment: Faces Faces Pain Scale: Hurts even more Pain Location: back - chronic pain Pain Descriptors / Indicators: Aching;Discomfort Pain Intervention(s): Repositioned;Monitored during session    Home Living Family/patient expects to be discharged to:: Private residence Living Arrangements: Spouse/significant other (boyfriend  "Engineer, technical sales") Available Help at Discharge: Family;Available 24 hours/day Type of Home: House Home Access: Stairs to enter Entrance Stairs-Rails: Right Entrance Stairs-Number of Steps: 4 Home Layout: One level Home Equipment:  (rollator)      Prior Function Level of Independence: Independent with assistive device(s);Needs assistance   Gait / Transfers Assistance Needed: Ambulates household distances with rollator but pt & significant other also endorse pt sitting on rollator & Bill pushing her around. Requires assistance for balance with stair negotiation.  ADL's / Homemaking Assistance Needed: "cooks some" but not all meals. Is able to dress herself without assistance.  Comments: Pt has hx of MS & presents with rigid L knee (difficult to flex L knee even with PROM) & reports she has a L AFO to use 2/2 foot drop.     Hand Dominance   Dominant Hand: Right    Extremity/Trunk Assessment   Upper Extremity Assessment Upper Extremity Assessment: RUE deficits/detail (pt endorses numbness/tingling in RUE) RUE Deficits / Details: decreased grip strength, slight inattention to RUE during functional tasks, able to flex shoulder to at least 90 degrees    Lower Extremity Assessment Lower Extremity Assessment: LLE deficits/detail;RLE deficits/detail (BLE edema (L>R); pt endorses numbness/tingling in RLE; pt frequently refers to LLE as "my MS leg") RLE Deficits / Details: grossly 3-/5 as pt initially able to weight bear but with significant weakness as B knees begin to flex with inability to extend them in standing LLE Deficits / Details: LLE foot drop 2/2 hx of MS (pt reports she has an AFO at home she can use) but able to achieve neutral in standing with shoe donned, increased tone in LLE with difficulty performing L knee PROM flexion while pt is long sitting in recliner with pt reporting "it gets like that when I get nervous"       Communication      Cognition Arousal/Alertness:  Awake/alert Behavior During Therapy: Anxious Overall Cognitive Status: Within Functional Limits for tasks assessed                                 General Comments: Pt AxO x 4 but very anxious & tearful at times throughout session with PT providing encouragement.      General Comments General comments (skin integrity, edema, etc.): HR 83-120 bpm during session; pt incontinent of urine during session & PT & NT assists with changing into clean clothes (pt reports incontinence is new)    Exercises     Assessment/Plan    PT Assessment Patient needs continued PT services  PT Problem List Decreased strength;Decreased mobility;Decreased safety awareness;Impaired tone;Decreased range of motion;Decreased coordination;Decreased activity tolerance;Cardiopulmonary status limiting activity;Pain;Impaired sensation;Decreased balance;Decreased knowledge of use of DME       PT Treatment Interventions DME instruction;Therapeutic activities;Cognitive remediation;Modalities;Gait training;Therapeutic exercise;Patient/family education;Stair training;Balance training;Functional mobility training;Neuromuscular re-education;Manual techniques    PT Goals (Current goals can be found in the Care Plan section)  Acute Rehab PT Goals Patient Stated Goal: get better, improve physically PT Goal Formulation: With patient Time For Goal Achievement: 12/23/20 Potential to Achieve Goals: Good    Frequency 7X/week   Barriers to discharge Inaccessible home environment;Decreased caregiver support stairs to  enter home, unsure if significant other can provide necessary physical assist at d/c    Co-evaluation               AM-PAC PT "6 Clicks" Mobility  Outcome Measure Help needed turning from your back to your side while in a flat bed without using bedrails?: A Lot Help needed moving from lying on your back to sitting on the side of a flat bed without using bedrails?: Total Help needed moving to  and from a bed to a chair (including a wheelchair)?: Total Help needed standing up from a chair using your arms (e.g., wheelchair or bedside chair)?: Total Help needed to walk in hospital room?: Total Help needed climbing 3-5 steps with a railing? : Total 6 Click Score: 7    End of Session Equipment Utilized During Treatment: Gait belt Activity Tolerance: Patient tolerated treatment well Patient left: in chair;with call bell/phone within reach;with chair alarm set;with nursing/sitter in room Nurse Communication: Mobility status PT Visit Diagnosis: Unsteadiness on feet (R26.81);Other abnormalities of gait and mobility (R26.89);Muscle weakness (generalized) (M62.81);Difficulty in walking, not elsewhere classified (R26.2);Hemiplegia and hemiparesis Hemiplegia - Right/Left: Right Hemiplegia - dominant/non-dominant: Dominant Hemiplegia - caused by: Cerebral infarction    Time: 9163-8466 PT Time Calculation (min) (ACUTE ONLY): 32 min   Charges:   PT Evaluation $PT Eval Moderate Complexity: 1 Mod PT Treatments $Therapeutic Activity: 8-22 mins        Lavone Nian, PT, DPT 12/09/20, 3:08 PM   Waunita Schooner 12/09/2020, 1:14 PM

## 2020-12-10 ENCOUNTER — Observation Stay: Admit: 2020-12-10 | Payer: Medicare Other

## 2020-12-10 ENCOUNTER — Observation Stay: Payer: Medicare Other

## 2020-12-10 DIAGNOSIS — E669 Obesity, unspecified: Secondary | ICD-10-CM | POA: Diagnosis present

## 2020-12-10 DIAGNOSIS — G8314 Monoplegia of lower limb affecting left nondominant side: Secondary | ICD-10-CM | POA: Diagnosis not present

## 2020-12-10 DIAGNOSIS — K5900 Constipation, unspecified: Secondary | ICD-10-CM | POA: Diagnosis present

## 2020-12-10 DIAGNOSIS — I10 Essential (primary) hypertension: Secondary | ICD-10-CM | POA: Diagnosis present

## 2020-12-10 DIAGNOSIS — R29818 Other symptoms and signs involving the nervous system: Secondary | ICD-10-CM | POA: Diagnosis not present

## 2020-12-10 DIAGNOSIS — Z886 Allergy status to analgesic agent status: Secondary | ICD-10-CM | POA: Diagnosis not present

## 2020-12-10 DIAGNOSIS — G35 Multiple sclerosis: Secondary | ICD-10-CM | POA: Diagnosis present

## 2020-12-10 DIAGNOSIS — F32A Depression, unspecified: Secondary | ICD-10-CM | POA: Diagnosis not present

## 2020-12-10 DIAGNOSIS — K219 Gastro-esophageal reflux disease without esophagitis: Secondary | ICD-10-CM | POA: Diagnosis present

## 2020-12-10 DIAGNOSIS — M7989 Other specified soft tissue disorders: Secondary | ICD-10-CM | POA: Diagnosis not present

## 2020-12-10 DIAGNOSIS — R531 Weakness: Secondary | ICD-10-CM | POA: Diagnosis present

## 2020-12-10 DIAGNOSIS — F418 Other specified anxiety disorders: Secondary | ICD-10-CM | POA: Diagnosis present

## 2020-12-10 DIAGNOSIS — I69351 Hemiplegia and hemiparesis following cerebral infarction affecting right dominant side: Secondary | ICD-10-CM | POA: Diagnosis not present

## 2020-12-10 DIAGNOSIS — G8929 Other chronic pain: Secondary | ICD-10-CM | POA: Diagnosis not present

## 2020-12-10 DIAGNOSIS — E785 Hyperlipidemia, unspecified: Secondary | ICD-10-CM | POA: Diagnosis not present

## 2020-12-10 DIAGNOSIS — F411 Generalized anxiety disorder: Secondary | ICD-10-CM | POA: Diagnosis present

## 2020-12-10 DIAGNOSIS — Z981 Arthrodesis status: Secondary | ICD-10-CM | POA: Diagnosis not present

## 2020-12-10 DIAGNOSIS — Z6831 Body mass index (BMI) 31.0-31.9, adult: Secondary | ICD-10-CM | POA: Diagnosis not present

## 2020-12-10 DIAGNOSIS — I639 Cerebral infarction, unspecified: Secondary | ICD-10-CM | POA: Diagnosis not present

## 2020-12-10 DIAGNOSIS — F482 Pseudobulbar affect: Secondary | ICD-10-CM | POA: Diagnosis not present

## 2020-12-10 DIAGNOSIS — I6389 Other cerebral infarction: Secondary | ICD-10-CM | POA: Diagnosis present

## 2020-12-10 DIAGNOSIS — I5030 Unspecified diastolic (congestive) heart failure: Secondary | ICD-10-CM | POA: Diagnosis not present

## 2020-12-10 DIAGNOSIS — Z20822 Contact with and (suspected) exposure to covid-19: Secondary | ICD-10-CM | POA: Diagnosis present

## 2020-12-10 DIAGNOSIS — R29700 NIHSS score 0: Secondary | ICD-10-CM | POA: Diagnosis present

## 2020-12-10 DIAGNOSIS — F419 Anxiety disorder, unspecified: Secondary | ICD-10-CM | POA: Diagnosis not present

## 2020-12-10 MED ORDER — ALPRAZOLAM 0.25 MG PO TABS
0.2500 mg | ORAL_TABLET | Freq: Once | ORAL | Status: AC
  Administered 2020-12-10: 0.25 mg via ORAL
  Filled 2020-12-10: qty 1

## 2020-12-10 MED ORDER — CLOPIDOGREL BISULFATE 75 MG PO TABS
75.0000 mg | ORAL_TABLET | Freq: Every day | ORAL | Status: DC
  Administered 2020-12-10 – 2020-12-13 (×4): 75 mg via ORAL
  Filled 2020-12-10 (×4): qty 1

## 2020-12-10 NOTE — Progress Notes (Signed)
Patient husband brought me medication and I walked it down to pharmacy to be labeled in order to give patient med. Notified pharmacy about how patient takes med at home, 9am to 9pm. She is resting in bed with call bell in reach.

## 2020-12-10 NOTE — Progress Notes (Signed)
Chart reviewed, Pt visited. Pt speech was clear and appropriate. Has MS. Pt reports lunch was good and she has no trouble chewing or swallowing. NO ST needs at this time. Please notify speech therapy if any speech or swallowing problems are reported or observed.

## 2020-12-10 NOTE — Progress Notes (Signed)
Physical Therapy Treatment Patient Details Name: Carly [name] MRN: [id number] DOB: 03-Apr-1951 Today's Date: 12/10/2020    History of Present Illness Pt is a 70 y/o F admitted from home on 12/08/20 with c/c of weakness. At home while exercising pt felt bilateral weakness, especially in grip strength R>L & felt as though she would pass out & laid down on floor. Pt admitted for workup for TIA vs CVA vs MS flare up. 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 PMH: MS, HTN, memory changes, DDD s/p lumbar fusion, depression & anxiety, adenomatous polyps, GERD, migraine, aspiration pneumonia, hx of chicken pox    PT Comments    Pt received in handoff from OT with pt agreeable to PT tx. Pt with caregiver Sharyn Lull) present with Sharyn Lull reporting she provides 8 hours of care 3 days per week to pt, pt is sedentary but is less mobile than prior to admission. Pt was able to be transported into bathroom via w/c & complete transfers without assistance but did need help for clothing management. On this date pt continues to require max assist for bed mobility, max assist for sitting balance & max assist for sit<>stand with RW. Pt demonstrates impaired midline orientation and R lateral lean in standing with impaired coordination noted in RLE when cued to widen stance/BOS for increased balance in standing. Pt attempted marching in place but has difficulty weight shifting to L to allow increased clearance RLE. Employee then present to take pt to imaging so pt assisted to bed. Pt would benefit from ongoing PT services to maximize independence with bed mobility, transfers, & short distance gait to decrease caregiver burden & reduce fall risk.    Follow Up Recommendations  CIR;Supervision/Assistance - 24 hour     Equipment Recommendations   (TBD in next venue)    Recommendations for Other Services Rehab consult     Precautions / Restrictions  Precautions Precautions: Fall Precaution Comments: R hemi Restrictions Weight Bearing Restrictions: No    Mobility  Bed Mobility Overal bed mobility: Needs Assistance Bed Mobility: Supine to Sit;Sit to Supine     Supine to sit: Mod assist;HOB elevated Sit to supine: Max assist;HOB elevated   General bed mobility comments: Improved ability to attempt to use bed rail with RUE, assistance to move BLE off of bed & upright trunk    Transfers Overall transfer level: Needs assistance Equipment used: Rolling walker (2 wheeled) Transfers: Sit to/from Stand   Stand pivot transfers: Mod assist;Max assist       General transfer comment: Max cuing for hand placement, cuing for increased anterior pelvic shift for upright posture  Ambulation/Gait                 Stairs             Wheelchair Mobility    Modified Rankin (Stroke Patients Only)       Balance Overall balance assessment: Needs assistance Sitting-balance support: Bilateral upper extremity supported;Feet supported Sitting balance-Leahy Scale: Poor   Postural control: Posterior lean;Right lateral lean Standing balance support: Bilateral upper extremity supported;During functional activity Standing balance-Leahy Scale: Poor Standing balance comment: max assist +1 for standing EOB; pt with R lateral lean                            Cognition Arousal/Alertness: Awake/alert Behavior During Therapy: Anxious Overall Cognitive Status: Within Functional Limits for tasks assessed  Exercises      General Comments        Pertinent Vitals/Pain Pain Assessment: No/denies pain    Home Living                      Prior Function            PT Goals (current goals can now be found in the care plan section) Acute Rehab PT Goals Patient Stated Goal: get better, improve physically PT Goal Formulation: With patient Time For Goal  Achievement: 12/23/20 Potential to Achieve Goals: Fair Progress towards PT goals: Progressing toward goals    Frequency    7X/week      PT Plan Current plan remains appropriate    Co-evaluation              AM-PAC PT "6 Clicks" Mobility   Outcome Measure  Help needed turning from your back to your side while in a flat bed without using bedrails?: A Lot Help needed moving from lying on your back to sitting on the side of a flat bed without using bedrails?: A Lot Help needed moving to and from a bed to a chair (including a wheelchair)?: Total Help needed standing up from a chair using your arms (e.g., wheelchair or bedside chair)?: Total Help needed to walk in hospital room?: Total Help needed climbing 3-5 steps with a railing? : Total 6 Click Score: 8    End of Session Equipment Utilized During Treatment: Gait belt Activity Tolerance: Patient tolerated treatment well Patient left: in bed (in handoff to transportation)   PT Visit Diagnosis: Unsteadiness on feet (R26.81);Other abnormalities of gait and mobility (R26.89);Muscle weakness (generalized) (M62.81);Difficulty in walking, not elsewhere classified (R26.2);Hemiplegia and hemiparesis Hemiplegia - Right/Left: Right Hemiplegia - dominant/non-dominant: Dominant Hemiplegia - caused by: Cerebral infarction     Time: 6256-3893 PT Time Calculation (min) (ACUTE ONLY): 13 min  Charges:  $Therapeutic Activity: 8-22 mins                     Lavone Nian, PT, DPT 12/10/20, 10:52 AM    Waunita Schooner 12/10/2020, 10:49 AM

## 2020-12-10 NOTE — Evaluation (Signed)
Occupational Therapy Evaluation Patient Details Name: Carly [name] MRN: [id number] DOB: 16-Mar-1951 Today's Date: 12/10/2020    History of Present Illness Pt is a 70 y/o F admitted from home on 12/08/20 with c/c of weakness. At home while exercising pt felt bilateral weakness, especially in grip strength R>L & felt as though she would pass out & laid down on floor. Pt admitted for workup for TIA vs CVA vs MS flare up. 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 PMH: MS, HTN, memory changes, DDD s/p lumbar fusion, depression & anxiety, adenomatous polyps, GERD, migraine, aspiration pneumonia, hx of chicken pox   Clinical Impression   Pt seen for OT evaluation this date. Prior to admission, pt reports being independent with dressing and bathing, and receiving assistance from significant other and personal care assistant for cooking and cleaning. Pt was living on the first floor of a multi-level house with significant other and receiving care from personal care assistant 8 hrs/day 3x/week. Pt currently presents with R-sided weakness and impaired balance, requiring MOD A for dynamic sitting balance, MOD A for seated grooming tasks, and MOD-MAX A for bed mobility. Pt tearful at times during session; OT providing encouragement. Pt educated on role of OT, POC, and discharge recommendations, with pt verbalizing understanding. Pt would benefit from additional skilled OT services, including additional instruction in dressing and bathing techniques, with or without assistive devices, to support recall and carryover prior to discharge and ultimately to maximize safety, independence, and minimize falls risk and caregiver burden. Upon discharge, recommend CIR.      Follow Up Recommendations  CIR    Equipment Recommendations  Other (comment) (defer to next venue of care)       Precautions / Restrictions Precautions Precautions: Fall Precaution  Comments: R hemi Restrictions Weight Bearing Restrictions: No      Mobility Bed Mobility Overal bed mobility: Needs Assistance Bed Mobility: Supine to Sit;Sit to Supine     Supine to sit: Mod assist;HOB elevated;Max assist Sit to supine: Max assist;HOB elevated   General bed mobility comments: MOD A to exit R side of bed and MAX A to exit L side of bed       Balance Overall balance assessment: Needs assistance Sitting-balance support: Feet supported;No upper extremity supported Sitting balance-Leahy Scale: Poor Sitting balance - Comments: MOD A for dyanmic sitting balance with UE engaging in overhead grooming tasks                           ADL either performed or assessed with clinical judgement   ADL Overall ADL's : Needs assistance/impaired     Grooming: Brushing hair;Moderate assistance;Sitting Grooming Details (indicate cue type and reason): To wash hair with shower cap, dry hair with towel, and brush hair while sitting EOB. Required HHA and proximal RUE/trunk support to engage R UE in overhead movements             Lower Body Dressing: Maximal assistance;Bed level Lower Body Dressing Details (indicate cue type and reason): to don socks                                  Pertinent Vitals/Pain Pain Assessment: No/denies pain     Hand Dominance Right   Extremity/Trunk Assessment Upper Extremity Assessment Upper Extremity Assessment: RUE deficits/detail RUE Deficits / Details: Slight inattention  to RUE during functional tasks. Strength: 3/5 grip, 4-/5 elbow flexion/extension, and at least 3/5 shoulder flexion RUE Sensation: WNL   Lower Extremity Assessment Lower Extremity Assessment: Defer to PT evaluation       Communication Communication Communication: No difficulties   Cognition Arousal/Alertness: Awake/alert Behavior During Therapy: Anxious Overall Cognitive Status: Within Functional Limits for tasks assessed                                  General Comments: Pt AxO x 4. Tearful at times during session with OT providing encouragement.              Home Living Family/patient expects to be discharged to:: Private residence Living Arrangements: Spouse/significant other (boyfriend "Bill") Available Help at Discharge: Family;Available 24 hours/day;Personal care attendant;Available PRN/intermittently (PCA 8 hours/day 3x/week) Type of Home: House Home Access: Stairs to enter [employer] of Steps: 4 Entrance Stairs-Rails: Right Home Layout: Multi-level;Able to live on main level with bedroom/bathroom           Bathroom Accessibility: Yes How Accessible: Accessible via walker Home Equipment: Walker - 4 wheels;Grab bars - toilet;Grab bars - tub/shower (rollator)          Prior Functioning/Environment Level of Independence: Independent with assistive device(s);Needs assistance  Gait / Transfers Assistance Needed: Functional mobility of household distances with rollator but pt endorses sitting on rollator & being pushed around. Requires assistance for balance with stair negotiation. ADL's / Homemaking Assistance Needed: Pt reports being independent with dressing and bathing and that PCA assists with cooking and cleaning.   Comments: Pt has hx of MS & presents with rigid L knee (difficult to flex L knee even with PROM) & reports she has a L AFO to use 2/2 foot drop.        OT Problem List: Decreased strength;Decreased activity tolerance;Impaired balance (sitting and/or standing);Impaired UE functional use      OT Treatment/Interventions: Self-care/ADL training;Therapeutic exercise;Neuromuscular education;Energy conservation;DME and/or AE instruction;Therapeutic activities;Visual/perceptual remediation/compensation;Patient/family education;Balance training    OT Goals(Current goals can be found in the care plan section) Acute Rehab OT Goals Patient Stated Goal: to get back to  swimming OT Goal Formulation: With patient Time For Goal Achievement: 12/24/20 Potential to Achieve Goals: Fair ADL Goals Pt Will Perform Grooming: with set-up;with supervision;sitting Pt Will Perform Upper Body Dressing: with supervision;sitting Pt Will Transfer to Toilet: with min assist;stand pivot transfer;bedside commode  OT Frequency: Min 3X/week    AM-PAC OT "6 Clicks" Daily Activity     Outcome Measure Help from another person eating meals?: None Help from another person taking care of personal grooming?: A Lot Help from another person toileting, which includes using toliet, bedpan, or urinal?: A Lot Help from another person bathing (including washing, rinsing, drying)?: A Lot Help from another person to put on and taking off regular upper body clothing?: A Little Help from another person to put on and taking off regular lower body clothing?: A Lot 6 Click Score: 15   End of Session Nurse Communication: Mobility status  Activity Tolerance: Patient tolerated treatment well Patient left: in bed;with family/visitor present;Other (comment) (With PT)  OT Visit Diagnosis: Hemiplegia and hemiparesis;Muscle weakness (generalized) (M62.81) Hemiplegia - Right/Left: Right Hemiplegia - dominant/non-dominant: Dominant Hemiplegia - caused by: Cerebral infarction                Time: 1601-0932 OT Time Calculation (min): 43 min Charges:  OT General Charges $  OT Visit: 1 Visit OT Evaluation $OT Eval Moderate Complexity: 1 Mod OT Treatments $Self Care/Home Management : 23-37 mins  Fredirick Maudlin, OTR/L [place]

## 2020-12-10 NOTE — Progress Notes (Signed)
Report given to [facility], 1C.

## 2020-12-10 NOTE — Progress Notes (Signed)
Inpatient Rehabilitation Admissions Coordinator  Inpatient rehab consult received. I met with patient at bedside with her caregiver, Sharyn Lull. We discussed goals and expectations of a possible CIR admit. Prior to admit patient was 1 person transfers and mobilized by sitting on her seated rollator due to her MS. LLE with the most deficits from MS. Now right sided deficits. I will contact her spouse by phone and clarify tomorrow CIR bed availability this week.  Danne Baxter, RN, MSN Rehab Admissions Coordinator [phone] 12/10/2020 4:31 PM

## 2020-12-10 NOTE — Progress Notes (Signed)
PROGRESS NOTE    Carly [name]  UVO:[id number] DOB: Mar 25, 1951 DOA: 12/08/2020 PCP: Einar Pheasant, MD   Brief Narrative:  This 70 years old female with PMH significant for MS, HTN, memory problems, degenerative disc disease, s/p lumbar fusion, depression and anxiety, history of adenomatous polyps, GERD, migraine who presented in the emergency department with 1 day history of generalized weakness.  Patient reports while doing exercise she noticed decreased grip strength in the right hand and she was having trouble picking up things with his right hand she felt dizzy and felt that she is about to pass out.  Patient was admitted for possible stroke versus MS flare.  Neurology consulted, recommended CT head and MRI.  MRI consistent with white matter infarction. Patient is having stroke workup.  Assessment & Plan:   Principal Problem:   Acute focal neurological deficit Active Problems:   Multiple sclerosis (HCC)   BP (high blood pressure)   Depression with anxiety   Acute focal neurologic deficit >> Improving Patient presents with intermittent weakness,  poor right hand grip, symptoms somewhat improved in ER. It could be TIA or MS flareup. Teleneurology recommend stroke and metabolic work-up and MRI to evaluate for possible MS flare worsening lesions. Neurology consulted,  Recommended complete stroke workup TSH, mag, Phos,  her initial laboratory results were within normal limits. Continue home Dalfamprimine for MS MRI brain consistent with white matter infarction. Allow for permissive HTN  Carotid duplex : Unremarkable.  2D Echo: completed, report pending. Hb A1C 5.7 Lipid panel : LDL 143 ContinueTele monitoring  SLP eval PT/OT : recommended CIR.  Hypertension Permissive hypertension for now as above.  Anxiety/depression Continue home Cymbalta, Ativan, mirtazapine. -Patient seems very sad, depressed and anxious. -Denies any suicidal homicidal ideations. -Psych  consulted for evaluation. -No inpatient psych hospitalization needed.   DVT prophylaxis: Lovenox Code Status:Full code. Family Communication: Husband at bed side. Disposition Plan:  Status is: Observation  The patient remains OBS appropriate and will d/c before 2 midnights.  Dispo: The patient is from: Home              Anticipated d/c is to: CIR              Patient currently is not medically stable to d/c.   Difficult to place patient No   Consultants:    Neurology  Procedures:  CT head, MRI Brain Antimicrobials:  Anti-infectives (From admission, onward)   None     Subjective: Patient was seen and examined at bedside.  Overnight events noted.  Patient reports having intermittent weakness which has been resolved.  She seems more anxious and depressed, denies any suicidal/ homicidal ideations.  Objective: Vitals:   12/09/20 2001 12/10/20 0019 12/10/20 0323 12/10/20 0719  BP: (!) 141/75 137/84 138/70 132/81  Pulse: 97 93 92 86  Resp: 18 18 16 19   Temp: 98.7 F (37.1 C) 98.3 F (36.8 C) 97.7 F (36.5 C) 98.1 F (36.7 C)  TempSrc: Oral Oral Axillary Oral  SpO2: 97% 97% 97% 97%  Weight:   80.4 kg   Height:        Intake/Output Summary (Last 24 hours) at 12/10/2020 1334 Last data filed at 12/10/2020 1100 Gross per 24 hour  Intake 0 ml  Output 2101 ml  Net -2101 ml   Filed Weights   12/08/20 1941 12/10/20 0323  Weight: 83.5 kg 80.4 kg    Examination:  General exam: Appears calm and comfortable, not in acute distress. Respiratory system: Clear to  auscultation. Respiratory effort normal. Cardiovascular system: S1 & S2 heard, RRR. No JVD, murmurs, rubs, gallops or clicks. No pedal edema. Gastrointestinal system: Abdomen is nondistended, soft and nontender. No organomegaly or masses felt. Normal bowel sounds heard. Central nervous system: Alert and oriented. No focal neurological deficits. Extremities: Symmetric 5 x 5 power.  No edema, no cyanosis, no  clubbing. Skin: No rashes, lesions or ulcers Psychiatry: Judgement and insight appear normal. Mood & affect appropriate.     Data Reviewed: I have personally reviewed following labs and imaging studies  CBC: Recent Labs  Lab 12/08/20 1943  WBC 9.3  NEUTROABS 7.1  HGB 13.7  HCT 41.3  MCV 84.8  PLT 283   Basic Metabolic Panel: Recent Labs  Lab 12/08/20 1943 12/09/20 0216  NA 138  --   K 3.8  --   CL 104  --   CO2 23  --   GLUCOSE 149*  --   BUN 11  --   CREATININE 0.69  --   CALCIUM 9.3  --   MG  --  2.3  PHOS  --  3.5   GFR: Estimated Creatinine Clearance: 65.7 mL/min (by C-G formula based on SCr of 0.69 mg/dL). Liver Function Tests: Recent Labs  Lab 12/08/20 1943  AST 29  ALT 26  ALKPHOS 100  BILITOT 0.7  PROT 7.4  ALBUMIN 4.4   No results for input(s): LIPASE, AMYLASE in the last 168 hours. No results for input(s): AMMONIA in the last 168 hours. Coagulation Profile: No results for input(s): INR, PROTIME in the last 168 hours. Cardiac Enzymes: No results for input(s): CKTOTAL, CKMB, CKMBINDEX, TROPONINI in the last 168 hours. BNP (last 3 results) No results for input(s): PROBNP in the last 8760 hours. HbA1C: Recent Labs    12/09/20 0534  HGBA1C 5.7*   CBG: Recent Labs  Lab 12/08/20 2041  GLUCAP 116*   Lipid Profile: Recent Labs    12/09/20 0534  CHOL 243*  HDL 72  LDLCALC 143*  TRIG 141  CHOLHDL 3.4   Thyroid Function Tests: Recent Labs    12/09/20 0216  TSH 2.522   Anemia Panel: No results for input(s): VITAMINB12, FOLATE, FERRITIN, TIBC, IRON, RETICCTPCT in the last 72 hours. Sepsis Labs: No results for input(s): PROCALCITON, LATICACIDVEN in the last 168 hours.  Recent Results (from the past 240 hour(s))  Resp Panel by RT-PCR (Flu A&B, Covid) Nasopharyngeal Swab     Status: None   Collection Time: 12/08/20  8:11 PM   Specimen: Nasopharyngeal Swab; Nasopharyngeal(NP) swabs in vial transport medium  Result Value Ref Range  Status   SARS Coronavirus 2 by RT PCR NEGATIVE NEGATIVE Final    Comment: (NOTE) SARS-CoV-2 target nucleic acids are NOT DETECTED.  The SARS-CoV-2 RNA is generally detectable in upper respiratory specimens during the acute phase of infection. The lowest concentration of SARS-CoV-2 viral copies this assay can detect is 138 copies/mL. A negative result does not preclude SARS-Cov-2 infection and should not be used as the sole basis for treatment or other patient management decisions. A negative result may occur with  improper specimen collection/handling, submission of specimen other than nasopharyngeal swab, presence of viral mutation(s) within the areas targeted by this assay, and inadequate number of viral copies(<138 copies/mL). A negative result must be combined with clinical observations, patient history, and epidemiological information. The expected result is Negative.  Fact Sheet for Patients:  EntrepreneurPulse.com.au  Fact Sheet for Healthcare Providers:  IncredibleEmployment.be  This test is no t  yet approved or cleared by the [place] and  has been authorized for detection and/or diagnosis of SARS-CoV-2 by FDA under an Emergency Use Authorization (EUA). This EUA will remain  in effect (meaning this test can be used) for the duration of the COVID-19 declaration under Section 564(b)(1) of the Act, 21 U.S.C.section 360bbb-3(b)(1), unless the authorization is terminated  or revoked sooner.       Influenza A by PCR NEGATIVE NEGATIVE Final   Influenza B by PCR NEGATIVE NEGATIVE Final    Comment: (NOTE) The Xpert Xpress SARS-CoV-2/FLU/RSV plus assay is intended as an aid in the diagnosis of influenza from Nasopharyngeal swab specimens and should not be used as a sole basis for treatment. Nasal washings and aspirates are unacceptable for Xpert Xpress SARS-CoV-2/FLU/RSV testing.  Fact Sheet for  Patients: EntrepreneurPulse.com.au  Fact Sheet for Healthcare Providers: IncredibleEmployment.be  This test is not yet approved or cleared by the [place] FDA and has been authorized for detection and/or diagnosis of SARS-CoV-2 by FDA under an Emergency Use Authorization (EUA). This EUA will remain in effect (meaning this test can be used) for the duration of the COVID-19 declaration under Section 564(b)(1) of the Act, 21 U.S.C. section 360bbb-3(b)(1), unless the authorization is terminated or revoked.  Performed at [facility], [address], [place], [place] [address]     Radiology Studies: CT HEAD WO CONTRAST  Result Date: 12/08/2020 CLINICAL DATA:  Arm weakness. EXAM: CT HEAD WITHOUT CONTRAST TECHNIQUE: Contiguous axial images were obtained from the base of the skull through the vertex without intravenous contrast. COMPARISON:  December 21, 2017. FINDINGS: Brain: Mild diffuse cortical atrophy is noted. Moderate chronic ischemic white matter disease is noted. No mass effect or midline shift is noted. Ventricular size is within normal limits. There is no evidence of mass lesion, hemorrhage or acute infarction. Vascular: No hyperdense vessel or unexpected calcification. Skull: Normal. Negative for fracture or focal lesion. Sinuses/Orbits: No acute finding. Other: None. IMPRESSION: Mild diffuse cortical atrophy. Moderate chronic ischemic white matter disease. No acute intracranial abnormality seen. Electronically Signed   By: Marijo Conception M.D.   On: 12/08/2020 21:25   MR ANGIO HEAD WO CONTRAST  Result Date: 12/09/2020 CLINICAL DATA:  Multiple sclerosis.  TIA.  Memory disturbance. EXAM: MRI HEAD WITHOUT AND WITH CONTRAST MRA HEAD WITHOUT CONTRAST TECHNIQUE: Multiplanar, multiecho pulse sequences of the brain and surrounding structures were obtained without and with intravenous contrast. Angiographic images of the head were obtained using MRA  technique without contrast. CONTRAST:  7.80mL GADAVIST GADOBUTROL 1 MMOL/ML IV SOLN COMPARISON:  Head CT earlier tonight.  MRI 03/12/2016 FINDINGS: MRI HEAD FINDINGS Brain: Diffusion imaging shows a somewhat linear region restricted diffusion in the deep white matter of the left hemisphere in the corona radiography. The appearance is most suggestive of a white matter infarction. Acute multiple sclerosis lesions usually have a more round configuration. Abnormal T2 signal in the pons is stable since previous studies. Cerebral hemispheres show old small vessel infarctions of the thalami and basal ganglia. There is confluent abnormal T2 and FLAIR signal throughout white matter which could be due to a combination of small-vessel disease and chronic demyelinating disease. The majority of these findings appear similar to the study of 2017. There is been slightly more central volume loss with the ventricles being slightly larger. No mass, hemorrhage or extra-axial collection. There are foci of hemosiderin deposition associated with some of the old white matter insults. After contrast administration, no abnormal enhancement  occurs. Vascular: Major vessels at the base of the brain show flow. Skull and upper cervical spine: Negative Sinuses/Orbits: Clear/normal Other: None MRA HEAD FINDINGS Both internal carotid arteries are patent through the skull base and siphon regions. Right internal carotid artery supplies the right middle cerebral artery territory, both anterior cerebral artery territories and large posterior communicating artery. Left internal carotid artery supplies only the middle cerebral artery territory. No proximal MCA stenosis on either side. More distal branch vessels do show atherosclerotic narrowing and irregularity. Both vertebral arteries are patent to the basilar. No basilar stenosis. Posterior circulation branch vessels show flow. There is considerable atherosclerotic irregularity in the PCA branches.  IMPRESSION: 1. Acute white matter infarction in the deep white matter of the left corona radiata region. The appearance is much more consistent with a white matter infarction than an acute MS plaque. No mass effect or acute hemorrhage. 2. Extensive abnormal T2 and FLAIR signal throughout the brain as outlined above, similar to the study of 2017. The white matter disease could be due to a combination of small-vessel disease and chronic demyelinating disease. There is probably slight increase in central volume loss with the ventricles being slightly more prominent. 3. MR angiography does not show any correctable proximal stenosis or large vessel occlusion. Distal vessel atherosclerotic irregularity diffusely. This is most severe in the PCA branches. Electronically Signed   By: Nelson Chimes M.D.   On: 12/09/2020 01:23   MR Brain W and Wo Contrast  Result Date: 12/09/2020 CLINICAL DATA:  Multiple sclerosis.  TIA.  Memory disturbance. EXAM: MRI HEAD WITHOUT AND WITH CONTRAST MRA HEAD WITHOUT CONTRAST TECHNIQUE: Multiplanar, multiecho pulse sequences of the brain and surrounding structures were obtained without and with intravenous contrast. Angiographic images of the head were obtained using MRA technique without contrast. CONTRAST:  7.39mL GADAVIST GADOBUTROL 1 MMOL/ML IV SOLN COMPARISON:  Head CT earlier tonight.  MRI 03/12/2016 FINDINGS: MRI HEAD FINDINGS Brain: Diffusion imaging shows a somewhat linear region restricted diffusion in the deep white matter of the left hemisphere in the corona radiography. The appearance is most suggestive of a white matter infarction. Acute multiple sclerosis lesions usually have a more round configuration. Abnormal T2 signal in the pons is stable since previous studies. Cerebral hemispheres show old small vessel infarctions of the thalami and basal ganglia. There is confluent abnormal T2 and FLAIR signal throughout white matter which could be due to a combination of small-vessel  disease and chronic demyelinating disease. The majority of these findings appear similar to the study of 2017. There is been slightly more central volume loss with the ventricles being slightly larger. No mass, hemorrhage or extra-axial collection. There are foci of hemosiderin deposition associated with some of the old white matter insults. After contrast administration, no abnormal enhancement occurs. Vascular: Major vessels at the base of the brain show flow. Skull and upper cervical spine: Negative Sinuses/Orbits: Clear/normal Other: None MRA HEAD FINDINGS Both internal carotid arteries are patent through the skull base and siphon regions. Right internal carotid artery supplies the right middle cerebral artery territory, both anterior cerebral artery territories and large posterior communicating artery. Left internal carotid artery supplies only the middle cerebral artery territory. No proximal MCA stenosis on either side. More distal branch vessels do show atherosclerotic narrowing and irregularity. Both vertebral arteries are patent to the basilar. No basilar stenosis. Posterior circulation branch vessels show flow. There is considerable atherosclerotic irregularity in the PCA branches. IMPRESSION: 1. Acute white matter infarction in the deep white  matter of the left corona radiata region. The appearance is much more consistent with a white matter infarction than an acute MS plaque. No mass effect or acute hemorrhage. 2. Extensive abnormal T2 and FLAIR signal throughout the brain as outlined above, similar to the study of 2017. The white matter disease could be due to a combination of small-vessel disease and chronic demyelinating disease. There is probably slight increase in central volume loss with the ventricles being slightly more prominent. 3. MR angiography does not show any correctable proximal stenosis or large vessel occlusion. Distal vessel atherosclerotic irregularity diffusely. This is most severe  in the PCA branches. Electronically Signed   By: Nelson Chimes M.D.   On: 12/09/2020 01:23   US Carotid Bilateral  Result Date: 12/10/2020 CLINICAL DATA:  Stroke Hypertension EXAM: BILATERAL CAROTID DUPLEX ULTRASOUND TECHNIQUE: Pearline Cables scale imaging, color Doppler and duplex ultrasound were performed of bilateral carotid and vertebral arteries in the neck. COMPARISON:  None. FINDINGS: Criteria: Quantification of carotid stenosis is based on velocity parameters that correlate the residual internal carotid diameter with NASCET-based stenosis levels, using the diameter of the distal internal carotid lumen as the denominator for stenosis measurement. The following velocity measurements were obtained: RIGHT ICA: 72/17 cm/sec CCA: 28/76 cm/sec SYSTOLIC ICA/CCA RATIO:  1.0 ECA: 79 cm/sec LEFT ICA: 62/9 cm/sec CCA: 81/15 cm/sec SYSTOLIC ICA/CCA RATIO:  0.7 ECA: 66 cm/sec RIGHT CAROTID ARTERY: No significant stenosis of the internal carotid artery. RIGHT VERTEBRAL ARTERY:  Antegrade flow. LEFT CAROTID ARTERY: No significant stenosis of the internal carotid artery. LEFT VERTEBRAL ARTERY:  Antegrade flow. IMPRESSION: No significant stenosis of internal carotid arteries. Electronically Signed   By: Miachel Roux M.D.   On: 12/10/2020 11:13   DG Chest Portable 1 View  Result Date: 12/08/2020 CLINICAL DATA:  Weakness EXAM: PORTABLE CHEST 1 VIEW COMPARISON:  December 20, 2017 FINDINGS: The heart size and mediastinal contours are within normal limits. Aortic knob calcifications. Both lungs are clear. The visualized skeletal structures are unremarkable. IMPRESSION: No active disease. Electronically Signed   By: Prudencio Pair M.D.   On: 12/08/2020 21:17    Scheduled Meds: .  stroke: mapping our early stages of recovery book   Does not apply Once  . clopidogrel  75 mg Oral Daily  . dalfampridine  10 mg Oral Q12H  . DULoxetine  60 mg Oral Daily  . enoxaparin (LOVENOX) injection  0.5 mg/kg Subcutaneous Q24H  . fluticasone  1  spray Each Nare Daily  . LORazepam  0.25 mg Oral QHS  . mirtazapine  30 mg Oral Daily   Continuous Infusions:   LOS: 0 days    Time spent: 25 mins    Shawna Clamp, MD [facility]   If 7PM-7AM, please contact night-coverage

## 2020-12-11 ENCOUNTER — Inpatient Hospital Stay
Admit: 2020-12-11 | Discharge: 2020-12-11 | Disposition: A | Discharge status: Home / Self Care | Payer: Medicare Other | Attending: Neurology | Admitting: Neurology

## 2020-12-11 DIAGNOSIS — F411 Generalized anxiety disorder: Secondary | ICD-10-CM

## 2020-12-11 DIAGNOSIS — I639 Cerebral infarction, unspecified: Secondary | ICD-10-CM

## 2020-12-11 LAB — COMPREHENSIVE METABOLIC PANEL
ALT: 28 U/L (ref 0–44)
AST: 29 U/L (ref 15–41)
Albumin: 4.2 g/dL (ref 3.5–5.0)
Alkaline Phosphatase: 87 U/L (ref 38–126)
Anion gap: 8 (ref 5–15)
BUN: 13 mg/dL (ref 8–23)
CO2: 26 mmol/L (ref 22–32)
Calcium: 9.4 mg/dL (ref 8.9–10.3)
Chloride: 106 mmol/L (ref 98–111)
Creatinine, Ser: 0.73 mg/dL (ref 0.44–1.00)
GFR, Estimated: >60 mL/min (ref >60)
Glucose, Bld: 122 mg/dL — ABNORMAL HIGH (ref 70–99)
Potassium: 4.3 mmol/L (ref 3.5–5.1)
Sodium: 140 mmol/L (ref 135–145)
Total Bilirubin: 0.8 mg/dL (ref 0.3–1.2)
Total Protein: 7.1 g/dL (ref 6.5–8.1)

## 2020-12-11 LAB — ECHOCARDIOGRAM COMPLETE
AR max vel: 2.24 cm2
AV Area VTI: 2.61 cm2
AV Area mean vel: 2.44 cm2
AV Mean grad: 3 mmHg
AV Peak grad: 5.7 mmHg
Ao pk vel: 1.19 m/s
Area-P 1/2: 4.97 cm2
Height: 63 in
MV VTI: 1.99 cm2
Single Plane A4C EF: 62.5 %
Weight: 2846.58 oz

## 2020-12-11 LAB — CK: Total CK: 152 U/L (ref 38–234)

## 2020-12-11 LAB — PHOSPHORUS: Phosphorus: 4.7 mg/dL — ABNORMAL HIGH (ref 2.5–4.6)

## 2020-12-11 LAB — CBC
HCT: 42.8 % (ref 36.0–46.0)
Hemoglobin: 13.8 g/dL (ref 12.0–15.0)
MCH: 27.7 pg (ref 26.0–34.0)
MCHC: 32.2 g/dL (ref 30.0–36.0)
MCV: 85.9 fL (ref 80.0–100.0)
Platelets: 293 10*3/uL (ref 150–400)
RBC: 4.98 MIL/uL (ref 3.87–5.11)
RDW: 14.8 % (ref 11.5–15.5)
WBC: 8.5 10*3/uL (ref 4.0–10.5)
nRBC: 0 % (ref 0.0–0.2)

## 2020-12-11 LAB — MAGNESIUM: Magnesium: 2.1 mg/dL (ref 1.7–2.4)

## 2020-12-11 MED ORDER — ATORVASTATIN CALCIUM 20 MG PO TABS
80.0000 mg | ORAL_TABLET | Freq: Every day | ORAL | Status: DC
Start: 2020-12-11 — End: 2020-12-13
  Administered 2020-12-11 – 2020-12-12 (×2): 80 mg via ORAL
  Filled 2020-12-11 (×2): qty 4

## 2020-12-11 MED ORDER — PERFLUTREN LIPID MICROSPHERE
1.0000 mL | INTRAVENOUS | Status: AC | PRN
  Administered 2020-12-11: 2 mL via INTRAVENOUS
  Filled 2020-12-11: qty 10

## 2020-12-11 MED ORDER — ATORVASTATIN CALCIUM 20 MG PO TABS: 40.0000 mg | ORAL_TABLET | Freq: Every day | ORAL | Status: DC

## 2020-12-11 MED ORDER — MIRTAZAPINE 15 MG PO TABS
45.0000 mg | ORAL_TABLET | Freq: Every day | ORAL | Status: DC
Start: 2020-12-11 — End: 2020-12-13
  Administered 2020-12-11 – 2020-12-12 (×2): 45 mg via ORAL
  Filled 2020-12-11 (×2): qty 3

## 2020-12-11 NOTE — Progress Notes (Signed)
Alert and oriented x 4 with ocasional forgetfulness. C/o back pan. Tylenol given with relief. Right sided weakness. Needs 1-2 person assist with adls. Husband visiting at the beginning of shift, no concerns voiced. Pt desaturates to mid 5s when asleep. Supplemental oxygen 1 l/min initiated with good result. Will continue to monitor.

## 2020-12-11 NOTE — Progress Notes (Addendum)
PROGRESS NOTE    Carly [name]  EUM:[id number] DOB: 1951/04/28 DOA: 12/08/2020 PCP: Einar Pheasant, MD   Brief Narrative:  This 70 years old female with PMH significant for MS, HTN, memory problems, degenerative disc disease, s/p lumbar fusion, depression and anxiety, history of adenomatous polyps, GERD, migraine who presented in the emergency department with 1 day history of generalized weakness, more on right side.  Patient reports while doing exercise she noticed decreased grip strength in the right hand and she was having trouble picking up things with his right hand, she felt dizzy and felt that she is about to pass out.  Patient was admitted for possible stroke versus MS flare.  Neurology consulted, recommended CT head and MRI.  MRI consistent with white matter infarction. Patient is having stroke workup.  Assessment & Plan:   Principal Problem:   Generalized anxiety disorder Active Problems:   Multiple sclerosis (HCC)   BP (high blood pressure)   Depression with anxiety   Acute focal neurological deficit   Acute focal neurologic deficit/ Right Arm weakness >> Improving Patient presents with intermittent weakness,  poor right hand grip, symptoms somewhat improved in ER. It could be TIA or MS flareup. Teleneurology recommend stroke and metabolic work-up and MRI to evaluate for possible MS flare worsening lesions. Neurology consulted,  Recommended complete stroke workup TSH, mag, Phos,  her initial laboratory results were within normal limits. Continue home Dalfamprimine for MS MRI brain consistent with white matter infarction. Allow for permissive HTN  Carotid duplex : Unremarkable.  2D Echo: completed, report pending. Hb A1C 5.7 Lipid panel : LDL 143 ContinueTele monitoring  SLP eval PT/OT : recommended CIR.  Hypertension Permissive hypertension for now as above.  Anxiety/depression -Continue home Cymbalta, Ativan, mirtazapine. -Patient seems very sad,  depressed and anxious. -Denies any suicidal homicidal ideations. -Psych consulted for evaluation. -No inpatient psych hospitalization needed.   DVT prophylaxis: Lovenox Code Status:Full code. Family Communication: Husband at bed side. Disposition Plan:  Status is: Inpatient  Remains inpatient appropriate because:Inpatient level of care appropriate due to severity of illness   Dispo: The patient is from: Home              Anticipated d/c is to: CIR              Patient currently is not medically stable to d/c.   Difficult to place patient No    Consultants:    Neurology  Procedures:  CT head, MRI Brain Antimicrobials:  Anti-infectives (From admission, onward)   None     Subjective: Patient was seen and examined at bedside.  Overnight events noted.  Patient reports having intermittent weakness has improved She seems more anxious and depressed, denies any suicidal/ homicidal ideations.   Objective: Vitals:   12/11/20 0000 12/11/20 0501 12/11/20 0743 12/11/20 1150  BP: 127/86 (!) 141/85 (!) 146/91 (!) 146/91  Pulse: 85 91 83 89  Resp: 16 17 15 16   Temp: 98.5 F (36.9 C) 98.1 F (36.7 C) 98.2 F (36.8 C) 98.3 F (36.8 C)  TempSrc:   Oral Oral  SpO2: 96% 97% 99% 98%  Weight:  80.7 kg    Height:        Intake/Output Summary (Last 24 hours) at 12/11/2020 1443 Last data filed at 12/11/2020 1025 Gross per 24 hour  Intake 240 ml  Output --  Net 240 ml   Filed Weights   12/08/20 1941 12/10/20 0323 12/11/20 0501  Weight: 83.5 kg 80.4 kg 80.7 kg  Examination:  General exam: Appears calm and comfortable, not in acute distress. Respiratory system: Clear to auscultation. Respiratory effort normal. Cardiovascular system: S1 & S2 heard, RRR. No JVD, murmurs, rubs, gallops or clicks. No pedal edema. Gastrointestinal system: Abdomen is nondistended, soft and nontender. No organomegaly or masses felt. Normal bowel sounds heard. Central nervous system: Alert and  oriented. RUE 4/5, RLE 5/5, Normal LUE/LLE.Marland Kitchen Extremities:   No edema, no cyanosis, no clubbing. Skin: No rashes, lesions or ulcers Psychiatry: Judgement and insight appear normal. Mood & affect appropriate.     Data Reviewed: I have personally reviewed following labs and imaging studies  CBC: Recent Labs  Lab 12/08/20 1943 12/11/20 0457  WBC 9.3 8.5  NEUTROABS 7.1  --   HGB 13.7 13.8  HCT 41.3 42.8  MCV 84.8 85.9  PLT 299 144   Basic Metabolic Panel: Recent Labs  Lab 12/08/20 1943 12/09/20 0216 12/11/20 0457  NA 138  --  140  K 3.8  --  4.3  CL 104  --  106  CO2 23  --  26  GLUCOSE 149*  --  122*  BUN 11  --  13  CREATININE 0.69  --  0.73  CALCIUM 9.3  --  9.4  MG  --  2.3 2.1  PHOS  --  3.5 4.7*   GFR: Estimated Creatinine Clearance: 65.8 mL/min (by C-G formula based on SCr of 0.73 mg/dL). Liver Function Tests: Recent Labs  Lab 12/08/20 1943 12/11/20 0457  AST 29 29  ALT 26 28  ALKPHOS 100 87  BILITOT 0.7 0.8  PROT 7.4 7.1  ALBUMIN 4.4 4.2   No results for input(s): LIPASE, AMYLASE in the last 168 hours. No results for input(s): AMMONIA in the last 168 hours. Coagulation Profile: No results for input(s): INR, PROTIME in the last 168 hours. Cardiac Enzymes: Recent Labs  Lab 12/11/20 1350  CKTOTAL 152   BNP (last 3 results) No results for input(s): PROBNP in the last 8760 hours. HbA1C: Recent Labs    12/09/20 0534  HGBA1C 5.7*   CBG: Recent Labs  Lab 12/08/20 2041  GLUCAP 116*   Lipid Profile: Recent Labs    12/09/20 0534  CHOL 243*  HDL 72  LDLCALC 143*  TRIG 141  CHOLHDL 3.4   Thyroid Function Tests: Recent Labs    12/09/20 0216  TSH 2.522   Anemia Panel: No results for input(s): VITAMINB12, FOLATE, FERRITIN, TIBC, IRON, RETICCTPCT in the last 72 hours. Sepsis Labs: No results for input(s): PROCALCITON, LATICACIDVEN in the last 168 hours.  Recent Results (from the past 240 hour(s))  Resp Panel by RT-PCR (Flu A&B,  Covid) Nasopharyngeal Swab     Status: None   Collection Time: 12/08/20  8:11 PM   Specimen: Nasopharyngeal Swab; Nasopharyngeal(NP) swabs in vial transport medium  Result Value Ref Range Status   SARS Coronavirus 2 by RT PCR NEGATIVE NEGATIVE Final    Comment: (NOTE) SARS-CoV-2 target nucleic acids are NOT DETECTED.  The SARS-CoV-2 RNA is generally detectable in upper respiratory specimens during the acute phase of infection. The lowest concentration of SARS-CoV-2 viral copies this assay can detect is 138 copies/mL. A negative result does not preclude SARS-Cov-2 infection and should not be used as the sole basis for treatment or other patient management decisions. A negative result may occur with  improper specimen collection/handling, submission of specimen other than nasopharyngeal swab, presence of viral mutation(s) within the areas targeted by this assay, and inadequate number of viral  copies(<138 copies/mL). A negative result must be combined with clinical observations, patient history, and epidemiological information. The expected result is Negative.  Fact Sheet for Patients:  EntrepreneurPulse.com.au  Fact Sheet for Healthcare Providers:  IncredibleEmployment.be  This test is no t yet approved or cleared by the [place] FDA and  has been authorized for detection and/or diagnosis of SARS-CoV-2 by FDA under an Emergency Use Authorization (EUA). This EUA will remain  in effect (meaning this test can be used) for the duration of the COVID-19 declaration under Section 564(b)(1) of the Act, 21 U.S.C.section 360bbb-3(b)(1), unless the authorization is terminated  or revoked sooner.       Influenza A by PCR NEGATIVE NEGATIVE Final   Influenza B by PCR NEGATIVE NEGATIVE Final    Comment: (NOTE) The Xpert Xpress SARS-CoV-2/FLU/RSV plus assay is intended as an aid in the diagnosis of influenza from Nasopharyngeal swab specimens and should  not be used as a sole basis for treatment. Nasal washings and aspirates are unacceptable for Xpert Xpress SARS-CoV-2/FLU/RSV testing.  Fact Sheet for Patients: EntrepreneurPulse.com.au  Fact Sheet for Healthcare Providers: IncredibleEmployment.be  This test is not yet approved or cleared by the [place] FDA and has been authorized for detection and/or diagnosis of SARS-CoV-2 by FDA under an Emergency Use Authorization (EUA). This EUA will remain in effect (meaning this test can be used) for the duration of the COVID-19 declaration under Section 564(b)(1) of the Act, 21 U.S.C. section 360bbb-3(b)(1), unless the authorization is terminated or revoked.  Performed at [facility], [address] [place], [place] [address]     Radiology Studies: US Carotid Bilateral  Result Date: 12/10/2020 CLINICAL DATA:  Stroke Hypertension EXAM: BILATERAL CAROTID DUPLEX ULTRASOUND TECHNIQUE: Pearline Cables scale imaging, color Doppler and duplex ultrasound were performed of bilateral carotid and vertebral arteries in the neck. COMPARISON:  None. FINDINGS: Criteria: Quantification of carotid stenosis is based on velocity parameters that correlate the residual internal carotid diameter with NASCET-based stenosis levels, using the diameter of the distal internal carotid lumen as the denominator for stenosis measurement. The following velocity measurements were obtained: RIGHT ICA: 72/17 cm/sec CCA: 19/14 cm/sec SYSTOLIC ICA/CCA RATIO:  1.0 ECA: 79 cm/sec LEFT ICA: 62/9 cm/sec CCA: 78/29 cm/sec SYSTOLIC ICA/CCA RATIO:  0.7 ECA: 66 cm/sec RIGHT CAROTID ARTERY: No significant stenosis of the internal carotid artery. RIGHT VERTEBRAL ARTERY:  Antegrade flow. LEFT CAROTID ARTERY: No significant stenosis of the internal carotid artery. LEFT VERTEBRAL ARTERY:  Antegrade flow. IMPRESSION: No significant stenosis of internal carotid arteries. Electronically Signed   By: Miachel Roux  M.D.   On: 12/10/2020 11:13    Scheduled Meds: .  stroke: mapping our early stages of recovery book   Does not apply Once  . atorvastatin  40 mg Oral QHS  . clopidogrel  75 mg Oral Daily  . dalfampridine  10 mg Oral Q12H  . DULoxetine  60 mg Oral Daily  . enoxaparin (LOVENOX) injection  0.5 mg/kg Subcutaneous Q24H  . fluticasone  1 spray Each Nare Daily  . LORazepam  0.25 mg Oral QHS  . mirtazapine  45 mg Oral Daily   Continuous Infusions:   LOS: 1 day    Time spent: 25 mins    Shawna Clamp, MD [facility]   If 7PM-7AM, please contact night-coverage

## 2020-12-11 NOTE — Progress Notes (Signed)
SLP Cancellation Note  Patient Details Name: CYLINDA SANTOLI MRN: [id number] DOB: 1951-02-21   Cancelled treatment:       Reason Eval/Treat Not Completed:  (chart reviewed; consulted NSG) Per chart review, Neurology note stated "PMHx of multiple sclerosis presenting with an acute left corona radiata and periventricular white matter ischemic infarction. The infarction is in a small vessel distribution. Severechronicleukoaraiosis is also noted, appearing most consistent with either severe chronic small vessel ischemic changes, anatypical appearance of MS, or mixed pathology". Psychiatry is following for anxiety and depression, see note for details. NSG reported no deficits in swallowing today; pt communicating wants/needs to her adequately. No deficits of speech reported.  ST services will sign off w/ NSG/MD to reconsult if new needs arise. Recommend pt f/u w/ PCP post d/c for referral to outpatient ST services if any changes in her communication are apparent to her and family. None were noted in chart notes during her communications w/ MDs, [place] today.      Orinda Kenner, [place], CCC-SLP Speech Language Pathologist Rehab Services [phone] [facility] 12/11/2020, 6:12 PM

## 2020-12-11 NOTE — Progress Notes (Signed)
Brief Neuro Update:  Briefly, [name] is a 70 y.o. female with a PMHx of multiple sclerosis presenting with an acute left corona radiata and periventricular white matter ischemic infarction. The infarction is in a small vessel distribution. 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  Stroke workup with MRA with diffuse atherosclerotic irregularity. Carotid duplex with no significant stenosis. TTE with an EF of 60-65%, no shunt on color flow doppler. Lipid panel with an LDL of 143, started on Atorvastatin which I increased to 80mg  daily. She is allergic to aspirin and was therefore started on plavix 75mg  daily. HbA1c of 5.7.  The likely etiology of her stroke is small vessel disease.  Recs: - Continue plavix 75mg  daily and Atorvastatin 80mg  daily at discharge. - No further inpatient neurological workup recommended at this time. - Follow up with stroke clinic in 2-3 months.  [place] Pager Number [phone]

## 2020-12-11 NOTE — Progress Notes (Signed)
Inpatient Rehabilitation Admissions Coordinator  I spoke with patient's spouse by phone. We discussed goals and expectations of a possible CIR admit. He prefers CIR at this time. I await bed availability to admit pending medical workup completion. I feel she is a good candidate for CIR.  Danne Baxter, RN, MSN Rehab Admissions Coordinator [phone] 12/11/2020 1:09 PM

## 2020-12-11 NOTE — Consult Note (Signed)
Physical Medicine and Rehabilitation Consult Reason for Consult: Impaired mobility and ADLs Referring Physician: Shawna Clamp, MD   HPI: Carly [name] is a 70 y.o.right-handed female admitted from home on 12/08/20 with complaints of weakness. At home while exercising she felt bilateral weakness especially in right>left grip strength. She was found to have a white matter infarction. Current deficits include right upper extremity weakness and left lower extremity weakness. Physical Medicine & Rehabilitation was consulted to assess candidacy for CIR.    Review of Systems  Constitutional: Negative.   HENT: Negative.   Eyes: Negative.   Respiratory: Negative.   Cardiovascular: Negative.   Gastrointestinal: Negative.   Genitourinary: Negative.   Musculoskeletal: Negative.   Skin: Negative.   Neurological: Positive for weakness.  Endo/Heme/Allergies: Negative.   Psychiatric/Behavioral: Positive for depression. The patient is nervous/anxious.    Past Medical History:  Diagnosis Date  . Allergy   . Anxiety   . Aspiration pneumonia ([place])   . Depression   . Frequent headaches    H/O  . GERD (gastroesophageal reflux disease)   . History of chicken pox   . History of colon polyps   . Hx of migraines   . Multiple sclerosis ([place])   . PONV (postoperative nausea and vomiting)    Past Surgical History:  Procedure Laterality Date  . BACK SURGERY    . CHOLECYSTECTOMY    . COLONOSCOPY WITH PROPOFOL N/A 05/18/2017   Procedure: COLONOSCOPY WITH PROPOFOL;  Surgeon: [name], MD;  Location: [facility] ENDOSCOPY;  Service: Endoscopy;  Laterality: N/A;  . FOOT SURGERY  2015  . GALLBLADDER SURGERY  2008  . HARDWARE REMOVAL Left 02/14/2016   Procedure: LEFT FOOT REMOVAL DEEP IMPLANT;  Surgeon: [name], MD;  Location: [place];  Service: Orthopedics;  Laterality: Left;  . HERNIA REPAIR     Inguinal Hernia Repair  . SPINE SURGERY  2014   Family History   Problem Relation Age of Onset  . Arthritis Mother   . Hypertension Mother   . Macular degeneration Mother   . Hypertension Father   . Hyperlipidemia Father   . Heart disease Maternal Grandfather   . Diabetes Maternal Grandfather   . Kidney disease Paternal Grandmother    Social History:  reports that she has never smoked. She has never used smokeless tobacco. She reports that she does not drink alcohol and does not use drugs. Allergies:  Allergies  Allergen Reactions  . Sulfamethoxazole-Trimethoprim Itching  . Asa [Aspirin] Swelling  . Buspirone Other (See Comments)    Headaches    . Levofloxacin Other (See Comments)    "weakness"  . Lexapro [Escitalopram Oxalate] Other (See Comments)    Weakness   . Motrin [Ibuprofen] Swelling and Other (See Comments)  . Pollen Extract Hives  . Ceftin [Cefuroxime Axetil] Rash  . Penicillins Rash  . Sulfa Antibiotics Rash   Medications Prior to Admission  Medication Sig Dispense Refill  . acetaminophen (TYLENOL) 500 MG tablet Take 1,000 mg by mouth every 4 (four) hours as needed.    Marland Kitchen azelastine (ASTELIN) 0.1 % nasal spray Place 1 spray into both nostrils 2 (two) times daily. Use in each nostril as directed 30 mL 1  . Cholecalciferol (D3-1000 PO) Take 1,000 Units by mouth daily.    . Coenzyme Q10 (CO Q10) 100 MG CAPS Take 1 capsule by mouth daily.    Marland Kitchen dalfampridine 10 MG TB12 Take 10 mg by mouth every 12 (twelve) hours.    Marland Kitchen  DULoxetine (CYMBALTA) 60 MG capsule Take 60 mg by mouth daily.    . fexofenadine (ALLEGRA ALLERGY) 180 MG tablet Take 1 tablet (180 mg total) by mouth daily. 30 tablet 2  . fluticasone (FLONASE) 50 MCG/ACT nasal spray Place 1 spray into both nostrils daily. 16 g 2  . L-Methylfolate-Algae (DEPLIN 15 PO) Take 15 mg by mouth daily.    . Lactobacillus (PROBIOTIC ACIDOPHILUS PO) Take 1 capsule by mouth daily.    Marland Kitchen LORazepam (ATIVAN) 0.5 MG tablet Take 0.25 mg by mouth at bedtime.    . Magnesium Oxide 400 (240 Mg) MG TABS  TAKE ONE TABLET EVERY DAY 30 tablet 1  . mirtazapine (REMERON) 30 MG tablet Take 30 mg by mouth at bedtime.    Marland Kitchen omeprazole (PRILOSEC) 20 MG capsule Take 20 mg by mouth 2 (two) times daily before a meal.    . sodium chloride (OCEAN) 0.65 % nasal spray Place 1-2 sprays into the nose as needed.    . B Complex Vitamins (VITAMIN B COMPLEX PO) Take by mouth. (Patient not taking: No sig reported)    . Calcium-Phosphorus-Vitamin D (CITRACAL +D3 PO) Take by mouth. 2 tablets twice a day (Patient not taking: No sig reported)    . hydrocortisone (ANUSOL-HC) 25 MG suppository Place 1 suppository (25 mg total) rectally 2 (two) times daily. (Patient not taking: No sig reported) 12 suppository 0  . hydrocortisone 2.5 % cream Apply topically 2 (two) times daily as needed (Rash). (Patient not taking: No sig reported) 30 g 11  . ondansetron (ZOFRAN ODT) 4 MG disintegrating tablet Take 1 tablet (4 mg total) by mouth every 8 (eight) hours as needed for nausea or vomiting. (Patient not taking: No sig reported) 30 tablet 0  . oxazepam (SERAX) 10 MG capsule Take 10 mg by mouth at bedtime as needed for sleep or anxiety. (Patient not taking: No sig reported)    . tiZANidine (ZANAFLEX) 4 MG tablet Take 1 tablet by mouth 3 (three) times daily as needed. (Patient not taking: No sig reported)    . traZODone (DESYREL) 50 MG tablet Take 50 mg by mouth at bedtime.  (Patient not taking: No sig reported)  12    Home: Home Living Family/patient expects to be discharged to:: Private residence Living Arrangements: Spouse/significant other (boyfriend "Engineer, technical sales") Available Help at Discharge: Family,Available 24 hours/day,Personal care attendant,Available PRN/intermittently (PCA 8 hours/day 3x/week) Type of Home: House Home Access: Stairs to enter [employer] of Steps: 4 Entrance Stairs-Rails: Right Home Layout: Multi-level,Able to live on main level with bedroom/bathroom Bathroom Accessibility: Yes Home Equipment: Walker -  4 wheels,Grab bars - toilet,Grab bars - tub/shower (rollator)  Functional History: Prior Function Level of Independence: Independent with assistive device(s),Needs assistance Gait / Transfers Assistance Needed: Functional mobility of household distances with rollator but pt endorses sitting on rollator & being pushed around. Requires assistance for balance with stair negotiation. ADL's / Homemaking Assistance Needed: Pt reports being independent with dressing and bathing and that PCA assists with cooking and cleaning. Comments: Pt has hx of MS & presents with rigid L knee (difficult to flex L knee even with PROM) & reports she has a L AFO to use 2/2 foot drop. Functional Status:  Mobility: Bed Mobility Overal bed mobility: Needs Assistance Bed Mobility: Supine to Sit Supine to sit: Mod assist,Max assist,HOB elevated Sit to supine: Max assist,HOB elevated General bed mobility comments: Increased time to perform. Poor core strength with posterior/R lateral lean in sitting Transfers Overall transfer level: Needs  assistance Equipment used: Rolling walker (2 wheeled) Transfers: Sit to/from Stand Sit to Stand: Mod assist,From elevated surface Stand pivot transfers: Mod assist,Max assist General transfer comment: pt stood 3 x EOB prior to standing and taking steps to recliner. Pt has knee buckling and required mod-max assist to safely advance LEs. heavy use of RW for support Ambulation/Gait Ambulation/Gait assistance: Max assist Gait Distance (Feet): 5 Feet Assistive device: Rolling walker (2 wheeled) Gait Pattern/deviations: Step-to pattern,Ataxic,Trunk flexed General Gait Details: Pt was able to take ~ 5 steps from EOB to recliner wth max assist and vcs for progression. Poor RLE advancement with poor L wt shift Gait velocity: decreased    ADL: ADL Overall ADL's : Needs assistance/impaired Grooming: Brushing hair,Moderate assistance,Sitting Grooming Details (indicate cue type and  reason): To wash hair with shower cap, dry hair with towel, and brush hair while sitting EOB. Required HHA and proximal RUE/trunk support to engage R UE in overhead movements Lower Body Dressing: Maximal assistance,Bed level Lower Body Dressing Details (indicate cue type and reason): to don socks  Cognition: Cognition Overall Cognitive Status: Within Functional Limits for tasks assessed Orientation Level: Oriented X4 Cognition Arousal/Alertness: Awake/alert Behavior During Therapy: WFL for tasks assessed/performed Overall Cognitive Status: Within Functional Limits for tasks assessed General Comments: Pt is A and oriented however states she,"feels druged." Was able to follow commands consistently throughout.  Blood pressure (!) 146/91, pulse 83, temperature 98.2 F (36.8 C), temperature source Oral, resp. rate 15, height 5\' 3"  (1.6 m), weight 80.7 kg, SpO2 99 %. Physical Exam  Gen: no distress, normal appearing, family at bedside HEENT: oral mucosa pink and moist, NCAT Cardio: Reg rate Chest: normal effort, normal rate of breathing Abd: soft, non-distended Ext: no edema Psych: pleasant, normal affect Skin: intact Neuro: Alert and oriented x3. Delayed processing. 4/5 strength RUE and LUE, with the exception of 3/5 right WE and hand grip. 5/5 in other extremities.   Results for orders placed or performed during the hospital encounter of 12/08/20 (from the past 24 hour(s))  CBC     Status: None   Collection Time: 12/11/20  4:57 AM  Result Value Ref Range   WBC 8.5 4.0 - 10.5 K/uL   RBC 4.98 3.87 - 5.11 MIL/uL   Hemoglobin 13.8 12.0 - 15.0 g/dL   HCT 42.8 36.0 - 46.0 %   MCV 85.9 80.0 - 100.0 fL   MCH 27.7 26.0 - 34.0 pg   MCHC 32.2 30.0 - 36.0 g/dL   RDW 14.8 11.5 - 15.5 %   Platelets 293 150 - 400 K/uL   nRBC 0.0 0.0 - 0.2 %  Comprehensive metabolic panel     Status: Abnormal   Collection Time: 12/11/20  4:57 AM  Result Value Ref Range   Sodium 140 135 - 145 mmol/L    Potassium 4.3 3.5 - 5.1 mmol/L   Chloride 106 98 - 111 mmol/L   CO2 26 22 - 32 mmol/L   Glucose, Bld 122 (H) 70 - 99 mg/dL   BUN 13 8 - 23 mg/dL   Creatinine, Ser 0.73 0.44 - 1.00 mg/dL   Calcium 9.4 8.9 - 10.3 mg/dL   Total Protein 7.1 6.5 - 8.1 g/dL   Albumin 4.2 3.5 - 5.0 g/dL   AST 29 15 - 41 U/L   ALT 28 0 - 44 U/L   Alkaline Phosphatase 87 38 - 126 U/L   Total Bilirubin 0.8 0.3 - 1.2 mg/dL   GFR, Estimated >60 >60 mL/min  Anion gap 8 5 - 15  Magnesium     Status: None   Collection Time: 12/11/20  4:57 AM  Result Value Ref Range   Magnesium 2.1 1.7 - 2.4 mg/dL  Phosphorus     Status: Abnormal   Collection Time: 12/11/20  4:57 AM  Result Value Ref Range   Phosphorus 4.7 (H) 2.5 - 4.6 mg/dL   US Carotid Bilateral  Result Date: 12/10/2020 CLINICAL DATA:  Stroke Hypertension EXAM: BILATERAL CAROTID DUPLEX ULTRASOUND TECHNIQUE: Pearline Cables scale imaging, color Doppler and duplex ultrasound were performed of bilateral carotid and vertebral arteries in the neck. COMPARISON:  None. FINDINGS: Criteria: Quantification of carotid stenosis is based on velocity parameters that correlate the residual internal carotid diameter with NASCET-based stenosis levels, using the diameter of the distal internal carotid lumen as the denominator for stenosis measurement. The following velocity measurements were obtained: RIGHT ICA: 72/17 cm/sec CCA: 44/31 cm/sec SYSTOLIC ICA/CCA RATIO:  1.0 ECA: 79 cm/sec LEFT ICA: 62/9 cm/sec CCA: 54/00 cm/sec SYSTOLIC ICA/CCA RATIO:  0.7 ECA: 66 cm/sec RIGHT CAROTID ARTERY: No significant stenosis of the internal carotid artery. RIGHT VERTEBRAL ARTERY:  Antegrade flow. LEFT CAROTID ARTERY: No significant stenosis of the internal carotid artery. LEFT VERTEBRAL ARTERY:  Antegrade flow. IMPRESSION: No significant stenosis of internal carotid arteries. Electronically Signed   By: Miachel Roux M.D.   On: 12/10/2020 11:13     Assessment/Plan: Diagnosis: White matter infarction, left  side 1. Does the need for close, 24 hr/day medical supervision in concert with the patient's rehab needs make it unreasonable for this patient to be served in a less intensive setting? Yes 2. Co-Morbidities requiring supervision/potential complications:  1. Obesity (BMI 31.52): provided dietary education regarding this risk factor for stroke 2. MS: Discussed symptoms- predominantly LLE weakness.  3. HTN: Monitor BP TID 4. Depression 5. Anxiety 3. Due to bladder management, bowel management, safety, skin/wound care, disease management, medication administration, pain management and patient education, does the patient require 24 hr/day rehab nursing? Yes 4. Does the patient require coordinated care of a physician, rehab nurse, therapy disciplines of PT, OT, SLP to address physical and functional deficits in the context of the above medical diagnosis(es)? Yes Addressing deficits in the following areas: balance, endurance, locomotion, strength, transferring, bowel/bladder control, bathing, dressing, feeding, grooming, toileting and psychosocial support, cognition 5. Can the patient actively participate in an intensive therapy program of at least 3 hrs of therapy per day at least 5 days per week? Yes 6. The potential for patient to make measurable gains while on inpatient rehab is excellent 7. Anticipated functional outcomes upon discharge from inpatient rehab are S with PT, S with OT, S with SLP. 8. Estimated rehab length of stay to reach the above functional goals is: 12-16 days 9. Anticipated discharge destination: Home 10. Overall Rehab/Functional Prognosis: excellent  RECOMMENDATIONS: This patient's condition is appropriate for continued rehabilitative care in the following setting: CIR Patient has agreed to participate in recommended program. Yes Note that insurance prior authorization may be required for reimbursement for recommended care.  Comment: Thank you for this consult. Admission  coordinator to follow.   I have personally performed a face to face diagnostic evaluation, including, but not limited to relevant history and physical exam findings, of this patient and developed relevant assessment and plan.  Additionally, I have reviewed and concur with the physician assistant's documentation above.  Leeroy Cha, MD  [name], MD 12/11/2020

## 2020-12-11 NOTE — Progress Notes (Signed)
*  PRELIMINARY RESULTS* Echocardiogram 2D Echocardiogram has been performed.  Carly [name] 12/11/2020, 11:23 AM

## 2020-12-11 NOTE — Progress Notes (Signed)
Physical Therapy Treatment Patient Details Name: Carly [name] MRN: [id number] DOB: 01-Jun-1951 Today's Date: 12/11/2020    History of Present Illness Pt is a 70 y/o F admitted from home on 12/08/20 with c/c of weakness. At home while exercising pt felt bilateral weakness, especially in grip strength R>L & felt as though she would pass out & laid down on floor. Pt admitted for workup for TIA vs CVA vs MS flare up. 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 PMH: MS, HTN, memory changes, DDD s/p lumbar fusion, depression & anxiety, adenomatous polyps, GERD, migraine, aspiration pneumonia, hx of chicken pox    PT Comments    Pt was long sitting in bed frustrated about not having breakfast tray within reach. " I feel drugged." Pt was A and O x 4 but did have some episodes of lethargy. She was able to follow commands consistently throughout session but does require increased time to process. Was able top exit bed with mod-max assist of one + HOB elevated. Sat EOIB with min assist due to severe posterior R lateral lean. Constant vcs throughout session for L lateral wt shift. She stood from elevated bed height 3 x prior to taking ~ 5 steps to RW with max assist. L knee buckling noted with RLE advancement. Max assist to wt shift prior to advancing RLE. Pt does fatigue quickly with standing activity. She was repositioned in recliner and breakfast set up for pt to I'ly self feed. She will greatly benefit form CIR at DC to address deficits while assisting pt to PLOF. Pt was in recliner with chair alarm in place, call bell in reach, and purwick replaced. RN to contact author when pt wants to return to bed.     Follow Up Recommendations  CIR;Supervision/Assistance - 24 hour     Equipment Recommendations  Other (comment) (defer to next level of care)       Precautions / Restrictions Precautions Precautions: Fall Precaution Comments: R  hemi Restrictions Weight Bearing Restrictions: No    Mobility  Bed Mobility Overal bed mobility: Needs Assistance Bed Mobility: Supine to Sit     Supine to sit: Mod assist;Max assist;HOB elevated     General bed mobility comments: Increased time to perform. Poor core strength with posterior/R lateral lean in sitting    Transfers Overall transfer level: Needs assistance Equipment used: Rolling walker (2 wheeled) Transfers: Sit to/from Stand Sit to Stand: Mod assist;From elevated surface Stand pivot transfers: Mod assist;Max assist       General transfer comment: pt stood 3 x EOB prior to standing and taking steps to recliner. Pt has knee buckling and required mod-max assist to safely advance LEs. heavy use of RW for support  Ambulation/Gait Ambulation/Gait assistance: Max assist Gait Distance (Feet): 5 Feet Assistive device: Rolling walker (2 wheeled) Gait Pattern/deviations: Step-to pattern;Ataxic;Trunk flexed Gait velocity: decreased   General Gait Details: Pt was able to take ~ 5 steps from EOB to recliner wth max assist and vcs for progression. Poor RLE advancement with poor L wt shift       Balance Overall balance assessment: Needs assistance Sitting-balance support: Feet supported;No upper extremity supported Sitting balance-Leahy Scale: Poor Sitting balance - Comments: posterior R LOB Postural control: Posterior lean;Right lateral lean Standing balance support: Bilateral upper extremity supported;During functional activity Standing balance-Leahy Scale: Poor Standing balance comment: max assist +1 for standing EOB; pt with R lateral lean  Cognition Arousal/Alertness: Awake/alert Behavior During Therapy: WFL for tasks assessed/performed Overall Cognitive Status: Within Functional Limits for tasks assessed      General Comments: Pt is A and oriented however states she,"feels druged." Was able to follow commands consistently throughout.              Pertinent Vitals/Pain Pain Assessment: No/denies pain Faces Pain Scale: No hurt Pain Intervention(s): Limited activity within patient's tolerance;Monitored during session;Repositioned           PT Goals (current goals can now be found in the care plan section) Acute Rehab PT Goals Patient Stated Goal: go home after rehab Progress towards PT goals: Progressing toward goals    Frequency    7X/week      PT Plan Current plan remains appropriate       AM-PAC PT "6 Clicks" Mobility   Outcome Measure  Help needed turning from your back to your side while in a flat bed without using bedrails?: A Lot Help needed moving from lying on your back to sitting on the side of a flat bed without using bedrails?: A Lot Help needed moving to and from a bed to a chair (including a wheelchair)?: A Lot Help needed standing up from a chair using your arms (e.g., wheelchair or bedside chair)?: A Lot Help needed to walk in hospital room?: A Lot Help needed climbing 3-5 steps with a railing? : Total 6 Click Score: 11    End of Session Equipment Utilized During Treatment: Gait belt Activity Tolerance: Patient tolerated treatment well Patient left: in chair;with call bell/phone within reach;with chair alarm set Nurse Communication: Mobility status PT Visit Diagnosis: Unsteadiness on feet (R26.81);Other abnormalities of gait and mobility (R26.89);Muscle weakness (generalized) (M62.81);Difficulty in walking, not elsewhere classified (R26.2);Hemiplegia and hemiparesis Hemiplegia - Right/Left: Right Hemiplegia - dominant/non-dominant: Dominant Hemiplegia - caused by: Cerebral infarction     Time: 5732-2025 PT Time Calculation (min) (ACUTE ONLY): 28 min  Charges:  $Neuromuscular Re-education: 23-37 mins                     Julaine Fusi PTA 12/11/20, 9:37 AM

## 2020-12-11 NOTE — Consult Note (Signed)
Carly [name]   Reason for [name]: [name] for 70 year old woman with a history of chronic anxiety and depression currently in the hospital with a stroke Referring Physician: Dwyane Dee Patient Identification: Carly [name] MRN:  [id number] Principal Diagnosis: Generalized anxiety disorder Diagnosis:  Principal Problem:   Generalized anxiety disorder Active Problems:   Multiple sclerosis ([place])   BP (high blood pressure)   Depression with anxiety   Acute focal neurological deficit   Total Time spent with patient: 1 hour  Subjective:   Carly [name] is a 70 y.o. female patient admitted with "I get nervous".  HPI: Patient seen chart reviewed.  70 year old woman with a history of multiple sclerosis anxiety depression currently in the hospital with an acute stroke.  [name] about her anxiety and depression.  Patient reports that his specially since this hospitalization her affect anxiety has been worse but also she reports that she has longstanding chronic anxiety.  Everything makes her nervous.  Has bothered her husband and other family members for years.  Patient is aware of bed and regrets it.  Mildly dysphoric but not hopeless not suicidal.  No psychosis.  Patient's cognitive problems from the stroke seem to be gradually improving although she is still a little bit slowed down.  During the interview she has frequent outbursts of crying which are mood congruent but also seemed to have a bit of a character of being beyond her control.  No evidence of psychosis.  No substance abuse issues.  She is on chronic medication for anxiety and depression Cymbalta 60 mg/day, mirtazapine 30 mg at night, Ativan 0.5 mg at night.  She tells me she does not usually take the oxazepam that is listed in her medicine.  Patient lives with her husband.  3 adult children.  Reports good relationships with her family supportive.  Patient is familiar with appropriate treatment options and  modalities for anxiety and depression.  Past Psychiatric History: No history of hospitalization no history of suicide attempts.  Current medication is as far as she can remember the only medicine she has been on for mental health problems in the past.  Has seen therapist previously but not in the past couple years.  Tried to get into see somebody in the last couple years but COVID has made it difficult.  Risk to Self:   Risk to Others:   Prior Inpatient Therapy:   Prior Outpatient Therapy:    Past Medical History:  Past Medical History:  Diagnosis Date  . Allergy   . Anxiety   . Aspiration pneumonia ([place])   . Depression   . Frequent headaches    H/O  . GERD (gastroesophageal reflux disease)   . History of chicken pox   . History of colon polyps   . Hx of migraines   . Multiple sclerosis ([place])   . PONV (postoperative nausea and vomiting)     Past Surgical History:  Procedure Laterality Date  . BACK SURGERY    . CHOLECYSTECTOMY    . COLONOSCOPY WITH PROPOFOL N/A 05/18/2017   Procedure: COLONOSCOPY WITH PROPOFOL;  Surgeon: [name], MD;  Location: [facility] ENDOSCOPY;  Service: Endoscopy;  Laterality: N/A;  . FOOT SURGERY  2015  . GALLBLADDER SURGERY  2008  . HARDWARE REMOVAL Left 02/14/2016   Procedure: LEFT FOOT REMOVAL DEEP IMPLANT;  Surgeon: [name], MD;  Location: [facility];  Service: Orthopedics;  Laterality: Left;  . HERNIA REPAIR     Inguinal Hernia Repair  .  SPINE SURGERY  2014   Family History:  Family History  Problem Relation Age of Onset  . Arthritis Mother   . Hypertension Mother   . Macular degeneration Mother   . Hypertension Father   . Hyperlipidemia Father   . Heart disease Maternal Grandfather   . Diabetes Maternal Grandfather   . Kidney disease Paternal Grandmother    Family Psychiatric  History: Positive for multiple family members with anxiety issues Social History:  Social History   Substance and Sexual Activity  Alcohol  Use No  . Alcohol/week: 0.0 standard drinks     Social History   Substance and Sexual Activity  Drug Use No    Social History   Socioeconomic History  . Marital status: Married    Spouse name: Not on file  . Number of children: Not on file  . Years of education: Not on file  . Highest education level: Not on file  Occupational History  . Not on file  Tobacco Use  . Smoking status: Never Smoker  . Smokeless tobacco: Never Used  Vaping Use  . Vaping Use: Never used  Substance and Sexual Activity  . Alcohol use: No    Alcohol/week: 0.0 standard drinks  . Drug use: No  . Sexual activity: Not Currently  Other Topics Concern  . Not on file  Social History Narrative   Married    Social Determinants of Health   Financial Resource Strain: Low Risk   . Difficulty of Paying Living Expenses: Not hard at all  Food Insecurity: No Food Insecurity  . Worried About Charity fundraiser in the Last Year: Never true  . Ran Out of Food in the Last Year: Never true  Transportation Needs: No Transportation Needs  . Lack of Transportation (Medical): No  . Lack of Transportation (Non-Medical): No  Physical Activity: Not on file  Stress: No Stress Concern Present  . Feeling of Stress : Not at all  Social Connections: Unknown  . Frequency of Communication with Friends and Family: Not on file  . Frequency of Social Gatherings with Friends and Family: Not on file  . Attends Religious Services: Not on file  . Active Member of Clubs or Organizations: Not on file  . Attends Archivist Meetings: Not on file  . Marital Status: Married   Additional Social History:    Allergies:   Allergies  Allergen Reactions  . Sulfamethoxazole-Trimethoprim Itching  . Asa [Aspirin] Swelling  . Buspirone Other (See Comments)    Headaches    . Levofloxacin Other (See Comments)    "weakness"  . Lexapro [Escitalopram Oxalate] Other (See Comments)    Weakness   . Motrin [Ibuprofen] Swelling  and Other (See Comments)  . Pollen Extract Hives  . Ceftin [Cefuroxime Axetil] Rash  . Penicillins Rash  . Sulfa Antibiotics Rash    Labs:  Results for orders placed or performed during the hospital encounter of 12/08/20 (from the past 48 hour(s))  CBC     Status: None   Collection Time: 12/11/20  4:57 AM  Result Value Ref Range   WBC 8.5 4.0 - 10.5 K/uL   RBC 4.98 3.87 - 5.11 MIL/uL   Hemoglobin 13.8 12.0 - 15.0 g/dL   HCT 42.8 36.0 - 46.0 %   MCV 85.9 80.0 - 100.0 fL   MCH 27.7 26.0 - 34.0 pg   MCHC 32.2 30.0 - 36.0 g/dL   RDW 14.8 11.5 - 15.5 %   Platelets 293 150 -  400 K/uL   nRBC 0.0 0.0 - 0.2 %    Comment: Performed at [facility], [address] [place], [place] [address]  Comprehensive metabolic panel     Status: Abnormal   Collection Time: 12/11/20  4:57 AM  Result Value Ref Range   Sodium 140 135 - 145 mmol/L   Potassium 4.3 3.5 - 5.1 mmol/L   Chloride 106 98 - 111 mmol/L   CO2 26 22 - 32 mmol/L   Glucose, Bld 122 (H) 70 - 99 mg/dL    Comment: Glucose reference range applies only to samples taken after fasting for at least 8 hours.   BUN 13 8 - 23 mg/dL   Creatinine, Ser 0.73 0.44 - 1.00 mg/dL   Calcium 9.4 8.9 - 10.3 mg/dL   Total Protein 7.1 6.5 - 8.1 g/dL   Albumin 4.2 3.5 - 5.0 g/dL   AST 29 15 - 41 U/L   ALT 28 0 - 44 U/L   Alkaline Phosphatase 87 38 - 126 U/L   Total Bilirubin 0.8 0.3 - 1.2 mg/dL   GFR, Estimated >60 >60 mL/min    Comment: (NOTE) Calculated using the CKD-EPI Creatinine Equation (2021)    Anion gap 8 5 - 15    Comment: Performed at [facility], [address], [place], [place] [address]  Magnesium     Status: None   Collection Time: 12/11/20  4:57 AM  Result Value Ref Range   Magnesium 2.1 1.7 - 2.4 mg/dL    Comment: Performed at [facility], [address] [place], [place] [address]  Phosphorus     Status: Abnormal   Collection Time: 12/11/20  4:57 AM  Result Value Ref Range   Phosphorus 4.7 (H)  2.5 - 4.6 mg/dL    Comment: Performed at [facility], [address], [place], [place] [address]    Current Facility-Administered Medications  Medication Dose Route Frequency Provider Last Rate Last Admin  .  stroke: mapping our early stages of recovery book   Does not apply Once Marcelyn Bruins, MD      . acetaminophen (TYLENOL) tablet 650 mg  650 mg Oral Q4H PRN Marcelyn Bruins, MD   650 mg at 12/10/20 2007   Or  . acetaminophen (TYLENOL) 160 MG/5ML solution 650 mg  650 mg Per Tube Q4H PRN Marcelyn Bruins, MD       Or  . acetaminophen (TYLENOL) suppository 650 mg  650 mg Rectal Q4H PRN Marcelyn Bruins, MD      . clopidogrel (PLAVIX) tablet 75 mg  75 mg Oral Daily Donnetta Simpers, MD   75 mg at 12/11/20 1324  . dalfampridine TB12 10 mg  10 mg Oral Q12H Marcelyn Bruins, MD   10 mg at 12/10/20 2147  . DULoxetine (CYMBALTA) DR capsule 60 mg  60 mg Oral Daily Marcelyn Bruins, MD   60 mg at 12/11/20 4010  . enoxaparin (LOVENOX) injection 42.5 mg  0.5 mg/kg Subcutaneous Q24H Marcelyn Bruins, MD   42.5 mg at 12/10/20 2148  . fluticasone (FLONASE) 50 MCG/ACT nasal spray 1 spray  1 spray Each Nare Daily Marcelyn Bruins, MD   1 spray at 12/11/20 [phone]  . LORazepam (ATIVAN) tablet 0.25 mg  0.25 mg Oral QHS Marcelyn Bruins, MD   0.25 mg at 12/10/20 2147  . mirtazapine (REMERON) tablet 45 mg  45 mg Oral Daily Tarissa Kerin, Madie Reno, MD      .  senna-docusate (Senokot-S) tablet 1 tablet  1 tablet Oral QHS PRN Marcelyn Bruins, MD       Facility-Administered Medications Ordered in Other Encounters  Medication Dose Route Frequency Provider Last Rate Last Admin  . 0.9 %  sodium chloride infusion   Intravenous Continuous Lequita Asal, MD 10 mL/hr at 04/25/16 0855 New Bag at 04/25/16 0855  . acetaminophen (TYLENOL) tablet 650 mg  650 mg Oral Once Corcoran, Melissa C, MD      . perflutren lipid microspheres (DEFINITY) IV suspension  1-10 mL Intravenous PRN  Donnetta Simpers, MD   2 mL at 12/11/20 1035    Musculoskeletal: Strength & Muscle Tone: within normal limits Gait & Station: normal Patient leans: N/A  Psychiatric Specialty Exam: Physical Exam Vitals and nursing note reviewed.  Constitutional:      Appearance: She is well-developed and well-nourished.  HENT:     Head: Normocephalic and atraumatic.  Eyes:     Conjunctiva/sclera: Conjunctivae normal.     Pupils: Pupils are equal, round, and reactive to light.  Cardiovascular:     Heart sounds: Normal heart sounds.  Pulmonary:     Effort: Pulmonary effort is normal.  Abdominal:     Palpations: Abdomen is soft.  Musculoskeletal:        General: Normal range of motion.     Cervical back: Normal range of motion.  Skin:    General: Skin is warm and dry.  Neurological:     Mental Status: She is alert.  Psychiatric:        Attention and Perception: Attention normal.        Mood and Affect: Mood is anxious. Affect is tearful.        Speech: Speech is delayed.        Behavior: Behavior is slowed.        Thought Content: Thought content is not paranoid or delusional. Thought content does not include homicidal or suicidal ideation.        Cognition and Memory: Cognition is impaired.        Judgment: Judgment normal.     Review of Systems  Constitutional: Negative.   HENT: Negative.   Eyes: Negative.   Respiratory: Negative.   Cardiovascular: Negative.   Gastrointestinal: Negative.   Musculoskeletal: Negative.   Skin: Negative.   Neurological: Negative.   Psychiatric/Behavioral: The patient is nervous/anxious.     Blood pressure (!) 146/91, pulse 83, temperature 98.2 F (36.8 C), temperature source Oral, resp. rate 15, height 5\' 3"  (1.6 m), weight 80.7 kg, SpO2 99 %.Body mass index is 31.52 kg/m.  General Appearance: Casual  Eye Contact:  Good  Speech:  Clear and Coherent and Slow  Volume:  Decreased  Mood:  Anxious  Affect:  Congruent  Thought Process:  Goal  Directed  Orientation:  Full (Time, Place, and Person)  Thought Content:  Logical  Suicidal Thoughts:  No  Homicidal Thoughts:  No  Memory:  Immediate;   Fair Recent;   Fair Remote;   Fair  Judgement:  Fair  Insight:  Fair  Psychomotor Activity:  Decreased  Concentration:  Concentration: Fair  Recall:  [employer] of Knowledge:  Fair  Language:  Fair  Akathisia:  No  Handed:  Right  AIMS (if indicated):     Assets:  Communication Skills Desire for Improvement Financial Resources/Insurance Housing Resilience Social Support  ADL's:  Intact  Cognition:  Impaired,  Mild  Sleep:        Treatment  Plan Summary: Medication management and Plan Patient with chronic anxiety probably generalized anxiety disorder possibly dysthymia.  No psychosis.  No evidence of dangerousness.  Reviewed medications with her.  Discussed appropriate medication management of anxiety disorders.  Suggested we increase mirtazapine to 45 mg at night which is a more normal full-strength adult dosage of that medicine.  No change to other medicines for now.  Encourage patient to consider seeing a therapist after discharge.  A good option would be Miguel Dibble who takes Medicare and is here just around the corner from the hospital.  I will try and get her number into a note.  No need for psychiatric hospitalization.  Supportive counseling.  Patient agrees to plan.  Disposition: No evidence of imminent risk to self or others at present.   Patient does not meet criteria for psychiatric inpatient admission. Supportive therapy provided about ongoing stressors. Discussed crisis plan, support from social network, calling 911, coming to the Emergency Department, and calling Suicide Hotline.  Alethia Berthold, MD 12/11/2020 11:45 AM

## 2020-12-12 NOTE — Plan of Care (Signed)
  Problem: Education: Goal: Knowledge of General Education information will improve Description: Including pain rating scale, medication(s)/side effects and non-pharmacologic comfort measures Outcome: Progressing   Problem: Health Behavior/Discharge Planning: Goal: Ability to manage health-related needs will improve Outcome: Progressing   Problem: Clinical Measurements: Goal: Ability to maintain clinical measurements within normal limits will improve Outcome: Progressing Goal: Will remain free from infection Outcome: Progressing Goal: Diagnostic test results will improve Outcome: Progressing   Problem: Activity: Goal: Risk for activity intolerance will decrease Outcome: Progressing   Problem: Nutrition: Goal: Adequate nutrition will be maintained Outcome: Progressing   Problem: Coping: Goal: Level of anxiety will decrease Outcome: Progressing   Problem: Pain Managment: Goal: General experience of comfort will improve Outcome: Progressing   Problem: Safety: Goal: Ability to remain free from injury will improve Outcome: Progressing   

## 2020-12-12 NOTE — PMR Pre-admission (Signed)
PMR Admission Coordinator Pre-Admission Assessment  Patient: Carly [name] is an 70 y.o., female MRN: [id number] DOB: August 04, 1951 Height: 5\' 3"  (160 cm) Weight: 80.7 kg              Insurance Information HMO:  PPO:      PCP:      IPA:      80/20:      OTHER:  PRIMARY: Medicare a and b      Policy#: [id number]      Subscriber: pt Benefits:  Phone #: passport one online     Name: 12/12/2020 Eff. Date: 07/13/2009     Deduct: $1556      Out of Pocket Max: none      Life Max: none  CIR: 100%      SNF: 20 full days Outpatient: 80%     Co-Pay: 20% Home Health: 100%      Co-Pay: none DME: 80%     Co-Pay: 20% Providers: pt choice  SECONDARY: [place] [employer] [place]      Policy#: [id number]  Financial Counselor:       Phone#:   The "Data Collection Information Summary" for patients in Inpatient Rehabilitation Facilities with attached "Privacy Act [place] Records" was provided and verbally reviewed with: Patient and Family  Emergency Contact Information Contact Information    Name Relation Home Work Mobile   Munguia,Bill Spouse [phone]  [phone]     Current Medical History  Patient Admitting Diagnosis: CVA  History of Present Illness: 70 year old female with medical history significant for MS, HTN, memory changes, degenerative disease post lumbar fusion, depression , anxiety, GERD and migraines. Presented 12/08/2020 with 1 day history of generalized weakness, right more than left. Grip strength right hand and having trouble picking up things. Dizziness noted.   Neurology consulted and imaging revealed acute left corona radiata and periventricular white matter ischemic infarction. Small vessel distribution and severe chronic leukoaraiosis also noted. Appearing most consistent with either severe chronic small vessel ischemic changes , an atypical appearance for MS, or mixed pathology. MRA with diffuse atherosclerotic irregularity. Carotid duplex with no significant  stenosis. TTE with an EF of 60 to 65%. LDL of 143 and started on Atorvastatin. Allergic to ASA and therefore started on Plavix.  MS with chronic lower leg weakness. Currently not on any disease modifying meds. Patient does take Alphaprodine for he chronic gait dysfunction. Outpatient Neurology follow up recommended.    Complete NIHSS TOTAL: 3 Glasgow Coma Scale Score: 15  Past Medical History  Past Medical History:  Diagnosis Date  . Allergy   . Anxiety   . Aspiration pneumonia ([place])   . Depression   . Frequent headaches    H/O  . GERD (gastroesophageal reflux disease)   . History of chicken pox   . History of colon polyps   . Hx of migraines   . Multiple sclerosis ([place])   . PONV (postoperative nausea and vomiting)     Family History  family history includes Arthritis in her mother; Diabetes in her maternal grandfather; Heart disease in her maternal grandfather; Hyperlipidemia in her father; Hypertension in her father and mother; Kidney disease in her paternal grandmother; Macular degeneration in her mother.  Prior Rehab/Hospitalizations:  Has the patient had prior rehab or hospitalizations prior to admission? Yes  Has the patient had major surgery during 100 days prior to admission? No  Current Medications   Current Facility-Administered Medications:  .  acetaminophen (TYLENOL) tablet 650 mg, 650  mg, Oral, Q4H PRN, 650 mg at 12/12/20 1933 **OR** acetaminophen (TYLENOL) 160 MG/5ML solution 650 mg, 650 mg, Per Tube, Q4H PRN **OR** acetaminophen (TYLENOL) suppository 650 mg, 650 mg, Rectal, Q4H PRN, Marcelyn Bruins, MD .  atorvastatin (LIPITOR) tablet 80 mg, 80 mg, Oral, QHS, Donnetta Simpers, MD, 80 mg at 12/12/20 2108 .  clopidogrel (PLAVIX) tablet 75 mg, 75 mg, Oral, Daily, Donnetta Simpers, MD, 75 mg at 12/12/20 0908 .  dalfampridine TB12 10 mg, 10 mg, Oral, Q12H, Marcelyn Bruins, MD, 10 mg at 12/12/20 2128 .  DULoxetine (CYMBALTA) DR capsule 60 mg, 60 mg, Oral,  Daily, Marcelyn Bruins, MD, 60 mg at 12/12/20 0908 .  enoxaparin (LOVENOX) injection 42.5 mg, 0.5 mg/kg, Subcutaneous, Q24H, Marcelyn Bruins, MD, 42.5 mg at 12/12/20 2111 .  fluticasone (FLONASE) 50 MCG/ACT nasal spray 1 spray, 1 spray, Each Nare, Daily, Marcelyn Bruins, MD, 1 spray at 12/12/20 [phone] .  LORazepam (ATIVAN) tablet 0.25 mg, 0.25 mg, Oral, QHS, Marcelyn Bruins, MD, 0.25 mg at 12/12/20 2108 .  mirtazapine (REMERON) tablet 45 mg, 45 mg, Oral, Daily, Clapacs, John T, MD, 45 mg at 12/12/20 2108 .  senna-docusate (Senokot-S) tablet 1 tablet, 1 tablet, Oral, QHS PRN, Marcelyn Bruins, MD  Facility-Administered Medications Ordered in Other Encounters:  .  0.9 %  sodium chloride infusion, , Intravenous, Continuous, Corcoran, Melissa C, MD, Last Rate: 10 mL/hr at 04/25/16 0855, New Bag at 04/25/16 0855 .  acetaminophen (TYLENOL) tablet 650 mg, 650 mg, Oral, Once, Corcoran, Drue Second, MD  Patients Current Diet:  Diet Order            Diet Heart Room service appropriate? Yes; Fluid consistency: Thin  Diet effective now                 Precautions / Restrictions Precautions Precautions: Fall Precaution Comments: R hemi Restrictions Weight Bearing Restrictions: No   Has the patient had 2 or more falls or a fall with injury in the past year?No  Prior Activity Level Limited Community (1-2x/wk): limited over past 5 years with her MS dx. Spouse and aide report that she was on person transfer to seated rollator. Typically transfer only or few steps. They would mobilize her seated on the rollator. They are hopeful that she will be motivated to become more mobile and not so sedentary with intensive rehab  Prior Functional Level Prior Function Level of Independence: Independent with assistive device(s),Needs assistance Gait / Transfers Assistance Needed: Functional mobility of household distances with rollator but pt endorses sitting on rollator & being pushed around.  Requires assistance for balance with stair negotiation. ADL's / Homemaking Assistance Needed: her aide assists with all adls Comments: Pt has hx of MS & presents with rigid L knee (difficult to flex L knee even with PROM) & reports she has a L AFO to use 2/2 foot drop.  Self Care: Did the patient need help bathing, dressing, using the toilet or eating?  Needed some help  Indoor Mobility: Did the patient need assistance with walking from room to room (with or without device)? Needed some help  Stairs: Did the patient need assistance with internal or external stairs (with or without device)? Needed some help  Functional Cognition: Did the patient need help planning regular tasks such as shopping or remembering to take medications? Needed some help  Home Assistive Devices / [place] Devices/Equipment: Walker (specify type),Cane (specify quad or straight),Brace (specify type) (quad, 4 wheel walker,  brace to help walk) Home Equipment: Walker - 4 wheels,Grab bars - toilet,Grab bars - tub/shower (rollator)  Prior Device Use: Indicate devices/aids used by the patient prior to current illness, exacerbation or injury? Walker  Current Functional Level Cognition  Overall Cognitive Status: Within Functional Limits for tasks assessed Orientation Level: Oriented to person,Oriented to place,Oriented to time,Disoriented to situation General Comments: Motivated to participate in therapy throughout. Tearful at times and vocalizing concerns regarding regaining R UE/LE use overtime    Extremity Assessment (includes Sensation/Coordination)  Upper Extremity Assessment: RUE deficits/detail RUE Deficits / Details: Slight inattention to RUE during functional tasks. Strength: 3/5 grip, 4-/5 elbow flexion/extension, and at least 3/5 shoulder flexion RUE Sensation: WNL  Lower Extremity Assessment: Defer to PT evaluation RLE Deficits / Details: grossly 3-/5 as pt initially able to weight bear but  with significant weakness as B knees begin to flex with inability to extend them in standing LLE Deficits / Details: LLE foot drop 2/2 hx of MS (pt reports she has an AFO at home she can use) but able to achieve neutral in standing with shoe donned, increased tone in LLE with difficulty performing L knee PROM flexion while pt is long sitting in recliner with pt reporting "it gets like that when I get nervous"    ADLs  Overall ADL's : Needs assistance/impaired Grooming: Wash/dry face,Set up,Brushing hair,Minimal assistance,Sitting Grooming Details (indicate cue type and reason): Pt able to engage R UE in grooming task via HHA from L UE. Required increased assistance to brush back of head Lower Body Dressing: Maximal assistance,Bed level Lower Body Dressing Details (indicate cue type and reason): to don socks Functional mobility during ADLs: Moderate assistance,+2 for physical assistance (stand pivot transfer bed>chair) General ADL Comments: Upon arrival to room, pt receiving bed-level assistance from nurse tech for bathing and dressing    Mobility  Overal bed mobility: Needs Assistance Bed Mobility: Supine to Sit Supine to sit: Min assist,HOB elevated Sit to supine: Max assist,HOB elevated General bed mobility comments: Min A for trunk elevation with HOB slightly elevated    Transfers  Overall transfer level: Needs assistance Equipment used: None Transfers: Stand Pivot Transfers Sit to Stand: Mod assist Stand pivot transfers: Mod assist,+2 physical assistance General transfer comment: Pt required multimodal cues for sequencing LLE and safe hand placement during transfer    Ambulation / Gait / Stairs / Wheelchair Mobility  Ambulation/Gait Ambulation/Gait assistance: Mod assist Gait Distance (Feet): 8 Feet Assistive device: Rolling walker (2 wheeled) Gait Pattern/deviations: Trunk flexed,Narrow base of support General Gait Details: Pt ambulated 1 x 5 ft then 1 x 8 ft with RW + mod  assist. vcs for upright posture throughout. alot of cues for lateral wt shifting to allow opposite LE advancement Gait velocity: decreased    Posture / Balance Dynamic Sitting Balance Sitting balance - Comments: Able to sit EOB with feet supporting and RUE on bed for support Balance Overall balance assessment: Needs assistance Sitting-balance support: Feet supported,Single extremity supported Sitting balance-Leahy Scale: Fair Sitting balance - Comments: Able to sit EOB with feet supporting and RUE on bed for support Postural control: Posterior lean,Right lateral lean Standing balance support: No upper extremity supported,During functional activity Standing balance-Leahy Scale: Poor Standing balance comment: pt is at high fall risk due to balance deficits in standing    Special needs/care consideration    Previous Home Environment  Living Arrangements: Spouse/significant other  Lives With: Spouse Available Help at Discharge: Family,Available 24 hours/day,Personal care attendant,Available PRN/intermittently Type of  Home: House Home Layout: Multi-level,Able to live on main level with bedroom/bathroom Home Access: Stairs to enter Entrance Stairs-Rails: Right Entrance Stairs-Number of Steps: 4 Bathroom Shower/Tub: Optometrist: Yes How Accessible: Accessible via walker [place]: No Additional Comments: has aide 10a until 5p daily, [facility]  Discharge Living Setting Plans for Discharge Living Setting: Patient's home,Lives with (comment) (spouse) Type of Home at Discharge: House Discharge Home Layout: Two level,Able to live on main level with bedroom/bathroom Discharge Home Access: Stairs to enter Entrance Stairs-Rails: Right Entrance Stairs-Number of Steps: 4 Discharge Bathroom Shower/Tub: Tub/shower unit,Curtain Discharge Bathroom Toilet: Standard Discharge Bathroom Accessibility: Yes How Accessible: Accessible via  walker Does the patient have any problems obtaining your medications?: No  Social/Family/Support Systems Contact Information: spouse, Engineer, technical sales Anticipated Caregiver: spouse and hired Engineer, production, Advertising account planner Information: see above Ability/Limitations of Caregiver: min assist level Caregiver Availability: 24/7 Discharge Plan Discussed with Primary Caregiver: Yes Is Caregiver In Agreement with Plan?: Yes Does Caregiver/Family have Issues with Lodging/Transportation while Pt is in Rehab?: No  Goals Patient/Family Goal for Rehab: min assist with PT and OT Expected length of stay: ELOS 12 to 16 days Pt/Family Agrees to Admission and willing to participate: Yes Program Orientation Provided & Reviewed with Pt/Caregiver Including Roles  & Responsibilities: Yes  Decrease burden of Care through IP rehab admission: n/a  Possible need for SNF placement upon discharge:not anticipated  Patient Condition: This patient's condition remains as documented in the consult dated 12/11/2020, in which the Rehabilitation Physician determined and documented that the patient's condition is appropriate for intensive rehabilitative care in an inpatient rehabilitation facility. Will admit to inpatient rehab today.  Preadmission Screen Completed By:  Cleatrice Burke, RN, 12/13/2020 10:14 AM ______________________________________________________________________   Discussed status with Dr. Dagoberto Ligas on 12/13/2020 at  1015 and received approval for admission today.  Admission Coordinator:  Cleatrice Burke, time 2025 Date 12/13/2020

## 2020-12-12 NOTE — Progress Notes (Signed)
Physical Therapy Treatment Patient Details Name: Carly [name] MRN: [id number] DOB: Sep 24, 1951 Today's Date: 12/12/2020    History of Present Illness Pt is a 70 y/o F admitted from home on 12/08/20 with c/c of weakness. At home while exercising pt felt bilateral weakness, especially in grip strength R>L & felt as though she would pass out & laid down on floor. Pt admitted for workup for TIA vs CVA vs MS flare up. 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 PMH: MS, HTN, memory changes, DDD s/p lumbar fusion, depression & anxiety, adenomatous polyps, GERD, migraine, aspiration pneumonia, hx of chicken pox    PT Comments    Pt was long sitting in bed, struggling with eating breakfast due to deficits/ dexterity deficits. She was alert and oriented x 3 and cooperative and pleasant throughout. Very motivated to improve and return to PLOF. She requested OOB to [facility] then to sit up in recliner. Was able to exit R side of bed with mod assist + increased time and max vcs. Stood 2 x EOB to RW prior to ambulating ~ 5 ft to [facility]. Successfully urinated prior to standing and ambulating 8 ft with RW + mod assist. Pt requires max vcs throughout and assistance with wt shift to allow opposite LE advancement. Overall pt demonstrated improved core strength from prior day however continues to present with weakness. Pt was repositioned in recliner posts session with breakfast placed in a position for pt to easily eat meal. Overall demonstrated much improved safety and abilities from day prior. Will greatly benefit from CIR at DC to address deficits while assisting pt to PLOF. Pt was in recliner with chair alarm in place and RN aware of pt's abilities. Acute PT will continue to follow and progress as able per POC.        Follow Up Recommendations  CIR;Supervision/Assistance - 24 hour     Equipment Recommendations  Other (comment) (defer to next level of care)     Recommendations for Other Services Rehab consult     Precautions / Restrictions Precautions Precautions: Fall Precaution Comments: R hemi Restrictions Weight Bearing Restrictions: No    Mobility  Bed Mobility Overal bed mobility: Needs Assistance Bed Mobility: Supine to Sit     Supine to sit: Mod assist;HOB elevated     General bed mobility comments: Increased time required to exit bed with vcs for improved technique and sequencing    Transfers Overall transfer level: Needs assistance Equipment used: Rolling walker (2 wheeled) Transfers: Sit to/from Stand Sit to Stand: Mod assist         General transfer comment: Pt stood 2 x EOB, 1 x BSC, and 1 x from recliner with mod assist + use of RW. vcs for improved technique and safety  Ambulation/Gait Ambulation/Gait assistance: Mod assist Gait Distance (Feet): 8 Feet Assistive device: Rolling walker (2 wheeled) Gait Pattern/deviations: Trunk flexed;Narrow base of support Gait velocity: decreased   General Gait Details: Pt ambulated 1 x 5 ft then 1 x 8 ft with RW + mod assist. vcs for upright posture throughout. alot of cues for lateral wt shifting to allow opposite LE advancement       Balance Overall balance assessment: Needs assistance Sitting-balance support: Feet supported;Bilateral upper extremity supported Sitting balance-Leahy Scale: Fair Sitting balance - Comments: pt demonstrated improved sitting balance/core strength from previous date. she was able to sit EOB with feet supported on floor and BUE support with close  supervision. Postural control: Posterior lean;Right lateral lean Standing balance support: Bilateral upper extremity supported;During functional activity Standing balance-Leahy Scale: Poor Standing balance comment: pt is at high fall risk due to balance deficits in standing       Cognition Arousal/Alertness: Awake/alert Behavior During Therapy: WFL for tasks assessed/performed Overall  Cognitive Status: Within Functional Limits for tasks assessed        General Comments: Pt was alert and oriented x 3. Has poor insight of deficits however is very motivated and cooperative throughout         General Comments General comments (skin integrity, edema, etc.): pt educated on importance of stretching/ther ex to promote ROM and strengthening      Pertinent Vitals/Pain Pain Assessment: 0-10 Pain Score: 2  Faces Pain Scale: Hurts a little bit Pain Location: back - chronic pain Pain Intervention(s): Limited activity within patient's tolerance;Monitored during session           PT Goals (current goals can now be found in the care plan section) Acute Rehab PT Goals Patient Stated Goal: go home after rehab Progress towards PT goals: Progressing toward goals    Frequency    7X/week      PT Plan Current plan remains appropriate       AM-PAC PT "6 Clicks" Mobility   Outcome Measure  Help needed turning from your back to your side while in a flat bed without using bedrails?: A Lot Help needed moving from lying on your back to sitting on the side of a flat bed without using bedrails?: A Lot Help needed moving to and from a bed to a chair (including a wheelchair)?: A Lot Help needed standing up from a chair using your arms (e.g., wheelchair or bedside chair)?: A Lot Help needed to walk in hospital room?: A Lot Help needed climbing 3-5 steps with a railing? : A Lot 6 Click Score: 12    End of Session Equipment Utilized During Treatment: Gait belt Activity Tolerance: Patient tolerated treatment well Patient left: in chair;with call bell/phone within reach;with chair alarm set Nurse Communication: Mobility status PT Visit Diagnosis: Unsteadiness on feet (R26.81);Other abnormalities of gait and mobility (R26.89);Muscle weakness (generalized) (M62.81);Difficulty in walking, not elsewhere classified (R26.2);Hemiplegia and hemiparesis Hemiplegia - Right/Left:  Right Hemiplegia - dominant/non-dominant: Dominant Hemiplegia - caused by: Cerebral infarction     Time: 3094-0768 PT Time Calculation (min) (ACUTE ONLY): 27 min  Charges:  $Gait Training: 8-22 mins $Neuromuscular Re-education: 8-22 mins                     Julaine Fusi PTA 12/12/20, 9:14 AM

## 2020-12-12 NOTE — Progress Notes (Signed)
Inpatient Rehabilitation Admissions Coordinator  I do not have a CIR bed to admit her to today.  Danne Baxter, RN, MSN Rehab Admissions Coordinator [phone] 12/12/2020 12:25 PM

## 2020-12-12 NOTE — Plan of Care (Signed)
  Problem: Education: Goal: Knowledge of General Education information will improve Description: Including pain rating scale, medication(s)/side effects and non-pharmacologic comfort measures Outcome: Progressing   Problem: Health Behavior/Discharge Planning: Goal: Ability to manage health-related needs will improve Outcome: Progressing   Problem: Clinical Measurements: Goal: Ability to maintain clinical measurements within normal limits will improve Outcome: Progressing Goal: Will remain free from infection Outcome: Progressing Goal: Diagnostic test results will improve Outcome: Progressing Goal: Respiratory complications will improve Outcome: Progressing Goal: Cardiovascular complication will be avoided Outcome: Progressing   Problem: Activity: Goal: Risk for activity intolerance will decrease Outcome: Progressing   Problem: Nutrition: Goal: Adequate nutrition will be maintained Outcome: Progressing   Problem: Coping: Goal: Level of anxiety will decrease Outcome: Progressing   Problem: Elimination: Goal: Will not experience complications related to bowel motility Outcome: Progressing Goal: Will not experience complications related to urinary retention Outcome: Progressing   Problem: Pain Managment: Goal: General experience of comfort will improve Outcome: Progressing   Problem: Safety: Goal: Ability to remain free from injury will improve Outcome: Progressing   Problem: Skin Integrity: Goal: Risk for impaired skin integrity will decrease Outcome: Progressing   Problem: Education: Goal: Knowledge of disease or condition will improve Outcome: Progressing Goal: Knowledge of secondary prevention will improve Outcome: Progressing Goal: Knowledge of patient specific risk factors addressed and post discharge goals established will improve Outcome: Progressing   

## 2020-12-12 NOTE — Progress Notes (Signed)
Occupational Therapy Treatment Patient Details Name: Carly [name] MRN: [id number] DOB: [birth year] Today's Date: 12/12/2020    History of present illness Pt is a 70 y/o F admitted from home on 12/08/20 with c/c of weakness. At home while exercising pt felt bilateral weakness, especially in grip strength R>L & felt as though she would pass out & laid down on floor. Pt admitted for workup for TIA vs CVA vs MS flare up. 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 PMH: MS, HTN, memory changes, DDD s/p lumbar fusion, depression & anxiety, adenomatous polyps, GERD, migraine, aspiration pneumonia, hx of chicken pox   OT comments  Pt seen for OT treatment on this date. Upon arrival to room, pt receiving assistance from nurse tech for bed-level bathing and dressing. Pt was agreeable to OT treatment and OOB mobility. Pt is steadily progressing towards goals and currently requires MIN A for bed mobility and MOD Ax2 for stand pivot transfer. Pt provided education on the importance of continuing to use R UE/LE during functional tasks, with pt able to engage R UE to wash/dry face and brush hair with LUE providing HHA and OT providing MIN A to reach back of head. Pt instructed and provided HEP for RUE AROM/AAROM. Pt is making good progress toward goals and continues to benefit from skilled OT services to maximize return to PLOF and minimize risk of future falls, injury, caregiver burden, and readmission. Will continue to follow POC. Discharge recommendation remains appropriate.    Follow Up Recommendations  CIR    Equipment Recommendations  Other (comment) (defer)       Precautions / Restrictions Precautions Precautions: Fall Precaution Comments: R hemi Restrictions Weight Bearing Restrictions: No       Mobility Bed Mobility Overal bed mobility: Needs Assistance Bed Mobility: Supine to Sit     Supine to sit: Min assist;HOB elevated      General bed mobility comments: Min A for trunk elevation with HOB slightly elevated    Transfers Overall transfer level: Needs assistance Equipment used: None Transfers: Stand Pivot Transfers Sit to Stand: Mod assist Stand pivot transfers: Mod assist;+2 physical assistance       General transfer comment: Pt required multimodal cues for sequencing LLE and safe hand placement during transfer    Balance Overall balance assessment: Needs assistance Sitting-balance support: Feet supported;Single extremity supported Sitting balance-Leahy Scale: Fair Sitting balance - Comments: Able to sit EOB with feet supporting and RUE on bed for support Postural control: Posterior lean;Right lateral lean Standing balance support: No upper extremity supported;During functional activity Standing balance-Leahy Scale: Poor Standing balance comment: pt is at high fall risk due to balance deficits in standing                           ADL either performed or assessed with clinical judgement   ADL Overall ADL's : Needs assistance/impaired     Grooming: Wash/dry face;Set up;Brushing hair;Minimal assistance;Sitting Grooming Details (indicate cue type and reason): Pt able to engage R UE in grooming task via HHA from L UE. Required increased assistance to brush back of head                             Functional mobility during ADLs: Moderate assistance;+2 for physical assistance (stand pivot transfer bed>chair) General ADL Comments: Upon arrival to room, pt receiving bed-level  assistance from nurse tech for bathing and dressing               Cognition Arousal/Alertness: Awake/alert Behavior During Therapy: [facility] for tasks assessed/performed Overall Cognitive Status: Within Functional Limits for tasks assessed                                 General Comments: Motivated to participate in therapy throughout. Tearful at times and vocalizing concerns regarding  regaining R UE/LE use overtime        Exercises General Exercises - Upper Extremity Shoulder Flexion: AAROM;Right;10 reps;Seated Shoulder ABduction: Self ROM;Right;10 reps;Seated Shoulder Horizontal ABduction: Self ROM;Right;Seated;10 reps Elbow Flexion: AAROM;Right;10 reps;Seated Other Exercises Other Exercises: Pt provided education on weigh shifting to relieve pressure while sitting in chair. Pt verbalizing understanding      General Comments pt educated on importance of stretching/ther ex to promote ROM and strengthening    Pertinent Vitals/ Pain       Pain Assessment: No/denies pain Pain Score: 2  Faces Pain Scale: Hurts a little bit Pain Location: back - chronic pain Pain Intervention(s): Limited activity within patient's tolerance;Monitored during session         Frequency  Min 3X/week        Progress Toward Goals  OT Goals(current goals can now be found in the care plan section)  Progress towards OT goals: Progressing toward goals  Acute Rehab OT Goals Patient Stated Goal: to get strength back OT Goal Formulation: With patient Time For Goal Achievement: 12/24/20 Potential to Achieve Goals: Good  Plan Discharge plan remains appropriate;Frequency remains appropriate       AM-PAC OT "6 Clicks" Daily Activity     Outcome Measure   Help from another person eating meals?: None Help from another person taking care of personal grooming?: A Little Help from another person toileting, which includes using toliet, bedpan, or urinal?: A Lot Help from another person bathing (including washing, rinsing, drying)?: A Lot Help from another person to put on and taking off regular upper body clothing?: A Little Help from another person to put on and taking off regular lower body clothing?: A Lot 6 Click Score: 16    End of Session Equipment Utilized During Treatment: Gait belt  OT Visit Diagnosis: Hemiplegia and hemiparesis;Muscle weakness (generalized)  (M62.81) Hemiplegia - Right/Left: Right Hemiplegia - dominant/non-dominant: Dominant Hemiplegia - caused by: Cerebral infarction   Activity Tolerance Patient tolerated treatment well   Patient Left in chair;with call bell/phone within reach;with chair alarm set   Nurse Communication Mobility status        Time: 1030-1110 OT Time Calculation (min): 40 min  Charges: OT General Charges $OT Visit: 1 Visit OT Treatments $Self Care/Home Management : 23-37 mins $Therapeutic Activity: 8-22 mins  Fredirick Maudlin, OTR/L [place]

## 2020-12-12 NOTE — Progress Notes (Signed)
PROGRESS NOTE    Carly [name]  OVZ:[id number] DOB: [birth year] DOA: 12/08/2020 PCP: Einar Pheasant, MD    Brief Narrative:  70 year old female with history of multiple sclerosis, hypertension, depression and anxiety, GERD and migraine presented to the emergency room with 1 day history of generalized weakness, right more than left.  She noticed decreased grip strength on the right hand and was having trouble picking up things.  She felt dizzy and was about to pass out.  Does have history of multiple sclerosis and generalized weakness.   Assessment & Plan:   Principal Problem:   Generalized anxiety disorder Active Problems:   Multiple sclerosis (HCC)   BP (high blood pressure)   Depression with anxiety   Acute focal neurological deficit  Acute ischemic stroke, left corona radiata and periventricular white matter ischemic infarction: MRI: Multiple white matter ischemic infarction. Carotid duplex, no significant stenosis. 2D echocardiogram with ejection fraction 60-65%.  No embolism.  No shunt. LDL 143, started on Lipitor.  Increasing to high-dose Lipitor.  Hemoglobin A1c 5.7 .  No indication for treatment. Allergic to aspirin, started on Plavix that will be continued. Outpatient stroke unit follow-up. Continue aggressive therapies, speech, PT and OT.  Refer to inpatient rehab.  Essential hypertension: Blood pressures well.  No indication for permissive hypertension.  Resume all home medications.  Anxiety/depression: Patient on Cymbalta, Ativan and mirtazapine.  Home medications resumed.  Multiple sclerosis: Patient with chronic left lower leg weakness.  Currently not on any disease modifying medications for her multiple sclerosis.  Patient does take dalfampridine for her chronic gait dysfunction.  Outpatient neurology follow-up.  Continue to mobilize.  Refer to acute inpatient rehab.   DVT prophylaxis: enoxaparin (LOVENOX) injection 42.5 mg Start: 12/08/20 2230   Code  Status: Full code Family Communication: None present at the bedside Disposition Plan: Status is: Inpatient  Remains inpatient appropriate because:Unsafe d/c plan   Dispo: The patient is from: Home              Anticipated d/c is to: CIR              Patient currently is medically stable to d/c.   Difficult to place patient No         Consultants:   Neurology  Procedures:   None  Antimicrobials:   None   Subjective: Patient was seen and examined.  She was trying to work with occupational therapy.  She needed 2 people to help her get out of the bed.  Right hand is weak, however she has difficulty ambulating with left leg numbness.  She is looking forward to go to rehab.  Objective: Vitals:   12/12/20 0044 12/12/20 0422 12/12/20 0737 12/12/20 1100  BP: 136/83 135/78 139/76 134/77  Pulse: 91 91 94 95  Resp: 15 (!) 22 18 20   Temp: 98.3 F (36.8 C) 97.7 F (36.5 C) 97.8 F (36.6 C) 97.8 F (36.6 C)  TempSrc: Oral Oral  Oral  SpO2: 99% 99% 99% 99%  Weight:      Height:        Intake/Output Summary (Last 24 hours) at 12/12/2020 1526 Last data filed at 12/12/2020 1300 Gross per 24 hour  Intake 120 ml  Output 500 ml  Net -380 ml   Filed Weights   12/08/20 1941 12/10/20 0323 12/11/20 0501  Weight: 83.5 kg 80.4 kg 80.7 kg    Examination:  General exam: Appears calm and comfortable  Chronically sick looking but not in any distress. On  room air. Respiratory system: Clear to auscultation. Respiratory effort normal.  No added sounds. Cardiovascular system: S1 & S2 heard, RRR. No JVD, murmurs, rubs, gallops or clicks. No pedal edema. Gastrointestinal system: Abdomen is nondistended, soft and nontender. No organomegaly or masses felt. Normal bowel sounds heard. Central nervous system: Alert and oriented.  Right upper extremity 3/5. Lower extremity are both weak, left more than right. Cranial nerves with no deficits.    Data Reviewed: I have personally reviewed  following labs and imaging studies  CBC: Recent Labs  Lab 12/08/20 1943 12/11/20 0457  WBC 9.3 8.5  NEUTROABS 7.1  --   HGB 13.7 13.8  HCT 41.3 42.8  MCV 84.8 85.9  PLT 299 161   Basic Metabolic Panel: Recent Labs  Lab 12/08/20 1943 12/09/20 0216 12/11/20 0457  NA 138  --  140  K 3.8  --  4.3  CL 104  --  106  CO2 23  --  26  GLUCOSE 149*  --  122*  BUN 11  --  13  CREATININE 0.69  --  0.73  CALCIUM 9.3  --  9.4  MG  --  2.3 2.1  PHOS  --  3.5 4.7*   GFR: Estimated Creatinine Clearance: 65.8 mL/min (by C-G formula based on SCr of 0.73 mg/dL). Liver Function Tests: Recent Labs  Lab 12/08/20 1943 12/11/20 0457  AST 29 29  ALT 26 28  ALKPHOS 100 87  BILITOT 0.7 0.8  PROT 7.4 7.1  ALBUMIN 4.4 4.2   No results for input(s): LIPASE, AMYLASE in the last 168 hours. No results for input(s): AMMONIA in the last 168 hours. Coagulation Profile: No results for input(s): INR, PROTIME in the last 168 hours. Cardiac Enzymes: Recent Labs  Lab 12/11/20 1350  CKTOTAL 152   BNP (last 3 results) No results for input(s): PROBNP in the last 8760 hours. HbA1C: No results for input(s): HGBA1C in the last 72 hours. CBG: Recent Labs  Lab 12/08/20 2041  GLUCAP 116*   Lipid Profile: No results for input(s): CHOL, HDL, LDLCALC, TRIG, CHOLHDL, LDLDIRECT in the last 72 hours. Thyroid Function Tests: No results for input(s): TSH, T4TOTAL, FREET4, T3FREE, THYROIDAB in the last 72 hours. Anemia Panel: No results for input(s): VITAMINB12, FOLATE, FERRITIN, TIBC, IRON, RETICCTPCT in the last 72 hours. Sepsis Labs: No results for input(s): PROCALCITON, LATICACIDVEN in the last 168 hours.  Recent Results (from the past 240 hour(s))  Resp Panel by RT-PCR (Flu A&B, Covid) Nasopharyngeal Swab     Status: None   Collection Time: 12/08/20  8:11 PM   Specimen: Nasopharyngeal Swab; Nasopharyngeal(NP) swabs in vial transport medium  Result Value Ref Range Status   SARS Coronavirus 2  by RT PCR NEGATIVE NEGATIVE Final    Comment: (NOTE) SARS-CoV-2 target nucleic acids are NOT DETECTED.  The SARS-CoV-2 RNA is generally detectable in upper respiratory specimens during the acute phase of infection. The lowest concentration of SARS-CoV-2 viral copies this assay can detect is 138 copies/mL. A negative result does not preclude SARS-Cov-2 infection and should not be used as the sole basis for treatment or other patient management decisions. A negative result may occur with  improper specimen collection/handling, submission of specimen other than nasopharyngeal swab, presence of viral mutation(s) within the areas targeted by this assay, and inadequate number of viral copies(<138 copies/mL). A negative result must be combined with clinical observations, patient history, and epidemiological information. The expected result is Negative.  Fact Sheet for Patients:  EntrepreneurPulse.com.au  Fact Sheet for Healthcare Providers:  IncredibleEmployment.be  This test is no t yet approved or cleared by the [place] FDA and  has been authorized for detection and/or diagnosis of SARS-CoV-2 by FDA under an Emergency Use Authorization (EUA). This EUA will remain  in effect (meaning this test can be used) for the duration of the COVID-19 declaration under Section 564(b)(1) of the Act, 21 U.S.C.section 360bbb-3(b)(1), unless the authorization is terminated  or revoked sooner.       Influenza A by PCR NEGATIVE NEGATIVE Final   Influenza B by PCR NEGATIVE NEGATIVE Final    Comment: (NOTE) The Xpert Xpress SARS-CoV-2/FLU/RSV plus assay is intended as an aid in the diagnosis of influenza from Nasopharyngeal swab specimens and should not be used as a sole basis for treatment. Nasal washings and aspirates are unacceptable for Xpert Xpress SARS-CoV-2/FLU/RSV testing.  Fact Sheet for Patients: EntrepreneurPulse.com.au  Fact  Sheet for Healthcare Providers: IncredibleEmployment.be  This test is not yet approved or cleared by the [place] FDA and has been authorized for detection and/or diagnosis of SARS-CoV-2 by FDA under an Emergency Use Authorization (EUA). This EUA will remain in effect (meaning this test can be used) for the duration of the COVID-19 declaration under Section 564(b)(1) of the Act, 21 U.S.C. section 360bbb-3(b)(1), unless the authorization is terminated or revoked.  Performed at [facility], [address] [place], [place] [address]          Radiology Studies: ECHOCARDIOGRAM COMPLETE  Result Date: 12/11/2020    ECHOCARDIOGRAM REPORT   Patient Name:   Carly [name] Date of Exam: 12/11/2020 Medical Rec #:  [id number]           Height:       63.0 in Accession #:    [id number]          Weight:       177.9 lb Date of Birth:  [birth year]           BSA:          1.840 m Patient Age:    43 years            BP:           146/91 mmHg Patient Gender: F                   HR:           99 bpm. Exam Location:  [facility] Procedure: 2D Echo, Color Doppler, Cardiac Doppler and Intracardiac            Opacification Agent Indications:     I63.9 Stroke  History:         Patient has no prior history of Echocardiogram examinations.                  MS; Signs/Symptoms:Weakness.  Sonographer:     Charmayne Sheer RDCS (AE) Referring Phys:  [id number] [facility] Diagnosing Phys: Neoma Laming MD  Sonographer Comments: Technically difficult study due to poor echo windows. IMPRESSIONS  1. Left ventricular ejection fraction, by estimation, is 60 to 65%. The left ventricle has normal function. The left ventricle has no regional wall motion abnormalities. Left ventricular diastolic parameters are consistent with Grade I diastolic dysfunction (impaired relaxation).  2. Right ventricular systolic function is normal. The right ventricular size is normal.  3. Left atrial size was mildly dilated.  4.  Right atrial size was mildly dilated.  5. The mitral valve is normal in  structure. No evidence of mitral valve regurgitation. No evidence of mitral stenosis.  6. The aortic valve is normal in structure. Aortic valve regurgitation is not visualized. No aortic stenosis is present.  7. The inferior vena cava is normal in size with greater than 50% respiratory variability, suggesting right atrial pressure of 3 mmHg. FINDINGS  Left Ventricle: Left ventricular ejection fraction, by estimation, is 60 to 65%. The left ventricle has normal function. The left ventricle has no regional wall motion abnormalities. Definity contrast agent was given IV to delineate the left ventricular  endocardial borders. The left ventricular internal cavity size was normal in size. There is no left ventricular hypertrophy. Left ventricular diastolic parameters are consistent with Grade I diastolic dysfunction (impaired relaxation). Right Ventricle: The right ventricular size is normal. No increase in right ventricular wall thickness. Right ventricular systolic function is normal. Left Atrium: Left atrial size was mildly dilated. Right Atrium: Right atrial size was mildly dilated. Pericardium: There is no evidence of pericardial effusion. Mitral Valve: The mitral valve is normal in structure. No evidence of mitral valve regurgitation. No evidence of mitral valve stenosis. MV peak gradient, 4.8 mmHg. The mean mitral valve gradient is 2.0 mmHg. Tricuspid Valve: The tricuspid valve is normal in structure. Tricuspid valve regurgitation is not demonstrated. No evidence of tricuspid stenosis. Aortic Valve: The aortic valve is normal in structure. Aortic valve regurgitation is not visualized. No aortic stenosis is present. Aortic valve mean gradient measures 3.0 mmHg. Aortic valve peak gradient measures 5.7 mmHg. Aortic valve area, by VTI measures 2.61 cm. Pulmonic Valve: The pulmonic valve was normal in structure. Pulmonic valve regurgitation is not  visualized. No evidence of pulmonic stenosis. Aorta: The aortic root is normal in size and structure. Venous: The inferior vena cava is normal in size with greater than 50% respiratory variability, suggesting right atrial pressure of 3 mmHg. IAS/Shunts: No atrial level shunt detected by color flow Doppler.  LEFT VENTRICLE PLAX 2D LVOT diam:     1.90 cm     Diastology LV SV:         45          LV e' medial:    4.79 cm/s LV SV Index:   25          LV E/e' medial:  13.1 LVOT Area:     2.84 cm    LV e' lateral:   4.46 cm/s                            LV E/e' lateral: 14.1  LV Volumes (MOD) LV vol d, MOD A4C: 78.9 ml LV vol s, MOD A4C: 29.6 ml LV SV MOD A4C:     78.9 ml AORTIC VALVE                   PULMONIC VALVE AV Area (Vmax):    2.24 cm    PV Vmax:       0.78 m/s AV Area (Vmean):   2.44 cm    PV Vmean:      45.700 cm/s AV Area (VTI):     2.61 cm    PV VTI:        0.089 m AV Vmax:           119.00 cm/s PV Peak grad:  2.4 mmHg AV Vmean:          77.600 cm/s PV Mean grad:  1.0 mmHg AV VTI:  0.174 m AV Peak Grad:      5.7 mmHg AV Mean Grad:      3.0 mmHg LVOT Vmax:         94.00 cm/s LVOT Vmean:        66.800 cm/s LVOT VTI:          0.160 m LVOT/AV VTI ratio: 0.92  AORTA Ao Root diam: 3.40 cm MITRAL VALVE MV Area (PHT): 4.97 cm    SHUNTS MV Area VTI:   1.99 cm    Systemic VTI:  0.16 m MV Peak grad:  4.8 mmHg    Systemic Diam: 1.90 cm MV Mean grad:  2.0 mmHg MV Vmax:       1.10 m/s MV Vmean:      66.6 cm/s MV Decel Time: 153 msec MV E velocity: 62.80 cm/s MV A velocity: 90.60 cm/s MV E/A ratio:  0.69 Neoma Laming MD Electronically signed by Neoma Laming MD Signature Date/Time: 12/11/2020/3:45:40 PM    Final         Scheduled Meds: . atorvastatin  80 mg Oral QHS  . clopidogrel  75 mg Oral Daily  . dalfampridine  10 mg Oral Q12H  . DULoxetine  60 mg Oral Daily  . enoxaparin (LOVENOX) injection  0.5 mg/kg Subcutaneous Q24H  . fluticasone  1 spray Each Nare Daily  . LORazepam  0.25 mg Oral QHS  .  mirtazapine  45 mg Oral Daily   Continuous Infusions:   LOS: 2 days    Time spent: 30 minutes    Barb Merino, MD [facility] Pager [phone]

## 2020-12-13 ENCOUNTER — Other Ambulatory Visit: Payer: Self-pay

## 2020-12-13 ENCOUNTER — Encounter (HOSPITAL_COMMUNITY): Payer: Self-pay | Admitting: Physical Medicine & Rehabilitation

## 2020-12-13 ENCOUNTER — Inpatient Hospital Stay (HOSPITAL_COMMUNITY)
Admission: RE | Admit: 2020-12-13 | Discharge: 2020-12-27 | DRG: 057 | Disposition: A | Discharge status: Home Health | Payer: Medicare Other | Source: Other Acute Inpatient Hospital | Attending: Physical Medicine and Rehabilitation | Admitting: Physical Medicine and Rehabilitation

## 2020-12-13 DIAGNOSIS — E669 Obesity, unspecified: Secondary | ICD-10-CM | POA: Diagnosis present

## 2020-12-13 DIAGNOSIS — K219 Gastro-esophageal reflux disease without esophagitis: Secondary | ICD-10-CM | POA: Diagnosis present

## 2020-12-13 DIAGNOSIS — F482 Pseudobulbar affect: Secondary | ICD-10-CM

## 2020-12-13 DIAGNOSIS — Z8249 Family history of ischemic heart disease and other diseases of the circulatory system: Secondary | ICD-10-CM | POA: Diagnosis not present

## 2020-12-13 DIAGNOSIS — I639 Cerebral infarction, unspecified: Secondary | ICD-10-CM | POA: Diagnosis not present

## 2020-12-13 DIAGNOSIS — Z6830 Body mass index (BMI) 30.0-30.9, adult: Secondary | ICD-10-CM | POA: Diagnosis not present

## 2020-12-13 DIAGNOSIS — M21372 Foot drop, left foot: Secondary | ICD-10-CM | POA: Diagnosis present

## 2020-12-13 DIAGNOSIS — G8929 Other chronic pain: Secondary | ICD-10-CM | POA: Diagnosis present

## 2020-12-13 DIAGNOSIS — E785 Hyperlipidemia, unspecified: Secondary | ICD-10-CM | POA: Diagnosis present

## 2020-12-13 DIAGNOSIS — I69351 Hemiplegia and hemiparesis following cerebral infarction affecting right dominant side: Secondary | ICD-10-CM | POA: Diagnosis not present

## 2020-12-13 DIAGNOSIS — G479 Sleep disorder, unspecified: Secondary | ICD-10-CM | POA: Diagnosis present

## 2020-12-13 DIAGNOSIS — Z981 Arthrodesis status: Secondary | ICD-10-CM

## 2020-12-13 DIAGNOSIS — F419 Anxiety disorder, unspecified: Secondary | ICD-10-CM | POA: Diagnosis present

## 2020-12-13 DIAGNOSIS — Z8261 Family history of arthritis: Secondary | ICD-10-CM

## 2020-12-13 DIAGNOSIS — G8314 Monoplegia of lower limb affecting left nondominant side: Secondary | ICD-10-CM | POA: Diagnosis present

## 2020-12-13 DIAGNOSIS — Z79899 Other long term (current) drug therapy: Secondary | ICD-10-CM | POA: Diagnosis not present

## 2020-12-13 DIAGNOSIS — Z833 Family history of diabetes mellitus: Secondary | ICD-10-CM | POA: Diagnosis not present

## 2020-12-13 DIAGNOSIS — G35D Multiple sclerosis, unspecified: Secondary | ICD-10-CM

## 2020-12-13 DIAGNOSIS — F32A Depression, unspecified: Secondary | ICD-10-CM | POA: Diagnosis present

## 2020-12-13 DIAGNOSIS — G35 Multiple sclerosis: Secondary | ICD-10-CM | POA: Diagnosis present

## 2020-12-13 DIAGNOSIS — I10 Essential (primary) hypertension: Secondary | ICD-10-CM | POA: Diagnosis present

## 2020-12-13 DIAGNOSIS — M7989 Other specified soft tissue disorders: Secondary | ICD-10-CM | POA: Diagnosis present

## 2020-12-13 DIAGNOSIS — Z83438 Family history of other disorder of lipoprotein metabolism and other lipidemia: Secondary | ICD-10-CM | POA: Diagnosis not present

## 2020-12-13 DIAGNOSIS — I959 Hypotension, unspecified: Secondary | ICD-10-CM | POA: Diagnosis not present

## 2020-12-13 DIAGNOSIS — K644 Residual hemorrhoidal skin tags: Secondary | ICD-10-CM | POA: Diagnosis present

## 2020-12-13 DIAGNOSIS — K5900 Constipation, unspecified: Secondary | ICD-10-CM | POA: Diagnosis present

## 2020-12-13 DIAGNOSIS — M2550 Pain in unspecified joint: Secondary | ICD-10-CM | POA: Diagnosis not present

## 2020-12-13 DIAGNOSIS — Z7401 Bed confinement status: Secondary | ICD-10-CM | POA: Diagnosis not present

## 2020-12-13 DIAGNOSIS — M545 Low back pain, unspecified: Secondary | ICD-10-CM | POA: Diagnosis present

## 2020-12-13 DIAGNOSIS — G459 Transient cerebral ischemic attack, unspecified: Secondary | ICD-10-CM | POA: Diagnosis not present

## 2020-12-13 DIAGNOSIS — F411 Generalized anxiety disorder: Secondary | ICD-10-CM | POA: Diagnosis not present

## 2020-12-13 MED ORDER — GUAIFENESIN-DM 100-10 MG/5ML PO SYRP
5.0000 mL | ORAL_SOLUTION | Freq: Four times a day (QID) | ORAL | Status: DC | PRN
  Administered 2020-12-25: 10 mL via ORAL
  Filled 2020-12-13 (×4): qty 10

## 2020-12-13 MED ORDER — SACCHAROMYCES BOULARDII 250 MG PO CAPS
250.0000 mg | ORAL_CAPSULE | Freq: Two times a day (BID) | ORAL | Status: DC
  Administered 2020-12-13 – 2020-12-27 (×28): 250 mg via ORAL
  Filled 2020-12-13 (×28): qty 1

## 2020-12-13 MED ORDER — LORAZEPAM 0.5 MG PO TABS
0.2500 mg | ORAL_TABLET | Freq: Every day | ORAL | Status: DC
  Administered 2020-12-13 – 2020-12-23 (×11): 0.25 mg via ORAL
  Filled 2020-12-13 (×12): qty 1

## 2020-12-13 MED ORDER — SALINE SPRAY 0.65 % NA SOLN
1.0000 | NASAL | Status: DC | PRN
  Filled 2020-12-13: qty 44

## 2020-12-13 MED ORDER — ATORVASTATIN CALCIUM 80 MG PO TABS: 80.0000 mg | ORAL_TABLET | Freq: Every day | ORAL | Status: DC

## 2020-12-13 MED ORDER — MAGNESIUM OXIDE 400 (241.3 MG) MG PO TABS
400.0000 mg | ORAL_TABLET | Freq: Every day | ORAL | Status: DC
  Administered 2020-12-14 – 2020-12-27 (×14): 400 mg via ORAL
  Filled 2020-12-13 (×14): qty 1

## 2020-12-13 MED ORDER — CLOPIDOGREL BISULFATE 75 MG PO TABS: 75.0000 mg | ORAL_TABLET | Freq: Every day | ORAL | Status: DC

## 2020-12-13 MED ORDER — DIPHENHYDRAMINE HCL 12.5 MG/5ML PO ELIX: 12.5000 mg | ORAL_SOLUTION | Freq: Four times a day (QID) | ORAL | Status: DC | PRN

## 2020-12-13 MED ORDER — ATORVASTATIN CALCIUM 80 MG PO TABS
80.0000 mg | ORAL_TABLET | Freq: Every day | ORAL | Status: DC
  Administered 2020-12-13 – 2020-12-26 (×14): 80 mg via ORAL
  Filled 2020-12-13 (×14): qty 1

## 2020-12-13 MED ORDER — BISACODYL 10 MG RE SUPP
10.0000 mg | Freq: Every day | RECTAL | Status: DC | PRN
  Administered 2020-12-13: 10 mg via RECTAL
  Filled 2020-12-13: qty 1

## 2020-12-13 MED ORDER — TRAZODONE HCL 50 MG PO TABS
25.0000 mg | ORAL_TABLET | Freq: Every evening | ORAL | Status: DC | PRN
  Administered 2020-12-20 – 2020-12-26 (×6): 50 mg via ORAL
  Filled 2020-12-13 (×9): qty 1

## 2020-12-13 MED ORDER — FLUTICASONE PROPIONATE 50 MCG/ACT NA SUSP
1.0000 | Freq: Every day | NASAL | Status: DC
  Administered 2020-12-14 – 2020-12-27 (×13): 1 via NASAL
  Filled 2020-12-13: qty 16

## 2020-12-13 MED ORDER — DULOXETINE HCL 60 MG PO CPEP
60.0000 mg | ORAL_CAPSULE | Freq: Every day | ORAL | Status: DC
  Administered 2020-12-14 – 2020-12-27 (×14): 60 mg via ORAL
  Filled 2020-12-13 (×15): qty 1

## 2020-12-13 MED ORDER — ACETAMINOPHEN 325 MG PO TABS
325.0000 mg | ORAL_TABLET | ORAL | Status: DC | PRN
  Administered 2020-12-13 – 2020-12-15 (×5): 650 mg via ORAL
  Administered 2020-12-16: 325 mg via ORAL
  Administered 2020-12-16 – 2020-12-27 (×31): 650 mg via ORAL
  Filled 2020-12-13 (×36): qty 2

## 2020-12-13 MED ORDER — CLOPIDOGREL BISULFATE 75 MG PO TABS
75.0000 mg | ORAL_TABLET | Freq: Every day | ORAL | Status: DC
  Administered 2020-12-14 – 2020-12-27 (×14): 75 mg via ORAL
  Filled 2020-12-13 (×14): qty 1

## 2020-12-13 MED ORDER — PROCHLORPERAZINE MALEATE 5 MG PO TABS
5.0000 mg | ORAL_TABLET | Freq: Four times a day (QID) | ORAL | Status: DC | PRN
  Filled 2020-12-13 (×2): qty 2

## 2020-12-13 MED ORDER — MIRTAZAPINE 15 MG PO TABS
45.0000 mg | ORAL_TABLET | Freq: Every day | ORAL | Status: DC
  Administered 2020-12-13 – 2020-12-26 (×14): 45 mg via ORAL
  Filled 2020-12-13 (×15): qty 3

## 2020-12-13 MED ORDER — POLYETHYLENE GLYCOL 3350 17 G PO PACK
17.0000 g | PACK | Freq: Every day | ORAL | Status: DC | PRN
  Administered 2020-12-18 – 2020-12-24 (×4): 17 g via ORAL
  Filled 2020-12-13 (×5): qty 1

## 2020-12-13 MED ORDER — ENOXAPARIN SODIUM 40 MG/0.4ML ~~LOC~~ SOLN
40.0000 mg | SUBCUTANEOUS | Status: DC
  Administered 2020-12-13 – 2020-12-26 (×14): 40 mg via SUBCUTANEOUS
  Filled 2020-12-13 (×14): qty 0.4

## 2020-12-13 MED ORDER — FAMOTIDINE 20 MG PO TABS: 20.0000 mg | ORAL_TABLET | Freq: Two times a day (BID) | ORAL | 1 refills | Status: DC

## 2020-12-13 MED ORDER — FLEET ENEMA 7-19 GM/118ML RE ENEM: 1.0000 | ENEMA | Freq: Once | RECTAL | Status: DC | PRN

## 2020-12-13 MED ORDER — PROCHLORPERAZINE EDISYLATE 10 MG/2ML IJ SOLN: 5.0000 mg | Freq: Four times a day (QID) | INTRAMUSCULAR | Status: DC | PRN

## 2020-12-13 MED ORDER — ALUM & MAG HYDROXIDE-SIMETH 200-200-20 MG/5ML PO SUSP: 30.0000 mL | ORAL | Status: DC | PRN

## 2020-12-13 MED ORDER — DALFAMPRIDINE ER 10 MG PO TB12
10.0000 mg | ORAL_TABLET | Freq: Two times a day (BID) | ORAL | Status: DC
  Administered 2020-12-13 – 2020-12-27 (×28): 10 mg via ORAL
  Filled 2020-12-13 (×26): qty 1

## 2020-12-13 MED ORDER — ACETAMINOPHEN 500 MG PO TABS: 500.0000 mg | ORAL_TABLET | ORAL | 0 refills | Status: DC | PRN

## 2020-12-13 MED ORDER — PROCHLORPERAZINE 25 MG RE SUPP
12.5000 mg | Freq: Four times a day (QID) | RECTAL | Status: DC | PRN
  Filled 2020-12-13: qty 1

## 2020-12-13 NOTE — Progress Notes (Signed)
Suppository administered per pt request.  Had small brown BM. Tolerated well

## 2020-12-13 NOTE — Progress Notes (Signed)
At 2330, NT informed RN on redness on patient's labia. On assessment, no open areas seen and pt denies burning. Perineum cleansed and powder applied. Pt requested to have the [place] since she has trouble turning. Will monitor.  At 0600, pt complained burning sensation on her labia. Purewick removed. Area appears reddened and irritated but no open area. Cleansed and powder applied in perineum. Bedpan in the room. Pt informed to call when she has to void. NT updated.

## 2020-12-13 NOTE — H&P (Signed)
Physical Medicine and Rehabilitation Admission H&P    Chief Complaint  Patient presents with  . Functional deficits due to stroke.     HPI:  Carly [name] is a 70 year old female with history of MS with LLE weakness--on Ampyra for gait disorder, GERD, anxiety/depression, HA who was admitted on 12/08/20 with one day history of weakness with difficulty standing, dizziness, mental status changes and difficulty picking items with right hand. 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 MRA brain negative for stenosis or LVO but showed diffuse atherosclerotic irregularity --most severe in PCA branches.  2D echo showed EF 60-65% with no wall abnormalities.  Carotid dopplers negative for ICA stenosis. Neurology felt that stroke was due to small vessel disease and Plavix added. Psychiatry consulted for assistance with management of acute on chronic GAD with bouts of lability and recommended increasing mirtazapine to 45 mg/hs as well as follow up with therapist for counseling after d/c.    Patient with chronic left sided weakness and now with right sided weakness affecting balance, has right lateral lean with left knee bucking, needs increased time to processing as well as intermittent problems with recall. Therapy ongoing and CIR recommended due to functional decline.    Pt reports needs suppository to have BM this evening- doesn't need oral meds.  LBM 2 days ago- constipation is one of her biggest limiters.  Denies even her regular "MS" pain right now, even in her teeth like normal.  Had some rubbing of R labia due to Purewick last night- is somewhat better.  Usually takes a probiotic lately to go regularly. Voiding well.  NO aphasia, or dysphagia.     Review of Systems  Constitutional: Negative.   HENT: Negative.  Negative for ear pain and sore throat.   Eyes: Negative for  blurred vision and double vision.  Respiratory: Negative.  Negative for cough, hemoptysis and shortness of breath.   Cardiovascular: Positive for leg swelling.       Usually takes Lasix for last few weeks for LE edema- has taken "awhile" to work for her.   Gastrointestinal: Positive for constipation. Negative for blood in stool and diarrhea.       Hemorrhoids- bothersome- hx of banding in past  Genitourinary: Negative for dysuria, hematuria and urgency.       Has R labial irritation from [place] last night  Musculoskeletal: Positive for myalgias. Negative for back pain and joint pain.  Skin:       Skin irritation at labia from [place] last night  Neurological: Positive for focal weakness and weakness. Negative for tingling, tremors, speech change, seizures and loss of consciousness.  Endo/Heme/Allergies: Negative for environmental allergies. Bruises/bleeds easily.  Psychiatric/Behavioral: Negative for hallucinations and memory loss. The patient has insomnia.   All other systems reviewed and are negative.     Past Medical History:  Diagnosis Date  . Allergy   . Anxiety   . Aspiration pneumonia ([place])   . Depression   . Frequent headaches    H/O  . GERD (gastroesophageal reflux disease)   . History of chicken pox   . History of colon polyps   . Hx of migraines   . Multiple sclerosis ([place])   . PONV (postoperative nausea and vomiting)     Past Surgical History:  Procedure Laterality Date  . BACK SURGERY    . CHOLECYSTECTOMY    .  COLONOSCOPY WITH PROPOFOL N/A 05/18/2017   Procedure: COLONOSCOPY WITH PROPOFOL;  Surgeon: [name], MD;  Location: [facility] ENDOSCOPY;  Service: Endoscopy;  Laterality: N/A;  . FOOT SURGERY  2015  . GALLBLADDER SURGERY  2008  . HARDWARE REMOVAL Left 02/14/2016   Procedure: LEFT FOOT REMOVAL DEEP IMPLANT;  Surgeon: [name], MD;  Location: [facility];  Service: Orthopedics;  Laterality: Left;  . HERNIA REPAIR     Inguinal Hernia  Repair  . SPINE SURGERY  2014   Family History  Problem Relation Age of Onset  . Arthritis Mother   . Hypertension Mother   . Macular degeneration Mother   . Hypertension Father   . Hyperlipidemia Father   . Heart disease Maternal Grandfather   . Diabetes Maternal Grandfather   . Kidney disease Paternal Grandmother     Social History:  Married. She required assistance with transfers with limited ambulation--sat and rolled on her rollater to get around. She reports that she has never smoked. She has never used smokeless tobacco. She reports that she does not drink alcohol and does not use drugs.     Allergies  Allergen Reactions  . Sulfamethoxazole-Trimethoprim Itching  . Asa [Aspirin] Swelling  . Buspirone Other (See Comments)    Headaches    . Levofloxacin Other (See Comments)    "weakness"  . Lexapro [Escitalopram Oxalate] Other (See Comments)    Weakness   . Motrin [Ibuprofen] Swelling and Other (See Comments)  . Pollen Extract Hives  . Ceftin [Cefuroxime Axetil] Rash  . Penicillins Rash  . Sulfa Antibiotics Rash    Medications Prior to Admission  Medication Sig Dispense Refill  . acetaminophen (TYLENOL) 500 MG tablet Take 1 tablet (500 mg total) by mouth every 4 (four) hours as needed. 30 tablet 0  . atorvastatin (LIPITOR) 80 MG tablet Take 1 tablet (80 mg total) by mouth at bedtime.    Marland Kitchen azelastine (ASTELIN) 0.1 % nasal spray Place 1 spray into both nostrils 2 (two) times daily. Use in each nostril as directed 30 mL 1  . Cholecalciferol (D3-1000 PO) Take 1,000 Units by mouth daily.    . clopidogrel (PLAVIX) 75 MG tablet Take 1 tablet (75 mg total) by mouth daily.    . Coenzyme Q10 (CO Q10) 100 MG CAPS Take 1 capsule by mouth daily.    Marland Kitchen dalfampridine 10 MG TB12 Take 10 mg by mouth every 12 (twelve) hours.    . DULoxetine (CYMBALTA) 60 MG capsule Take 60 mg by mouth daily.    . famotidine (PEPCID) 20 MG tablet Take 1 tablet (20 mg total) by mouth 2 (two) times  daily. 60 tablet 1  . fexofenadine (ALLEGRA ALLERGY) 180 MG tablet Take 1 tablet (180 mg total) by mouth daily. 30 tablet 2  . fluticasone (FLONASE) 50 MCG/ACT nasal spray Place 1 spray into both nostrils daily. 16 g 2  . L-Methylfolate-Algae (DEPLIN 15 PO) Take 15 mg by mouth daily.    . Lactobacillus (PROBIOTIC ACIDOPHILUS PO) Take 1 capsule by mouth daily.    Marland Kitchen LORazepam (ATIVAN) 0.5 MG tablet Take 0.25 mg by mouth at bedtime.    . Magnesium Oxide 400 (240 Mg) MG TABS TAKE ONE TABLET EVERY DAY 30 tablet 1  . mirtazapine (REMERON) 30 MG tablet Take 30 mg by mouth at bedtime.    . sodium chloride (OCEAN) 0.65 % nasal spray Place 1-2 sprays into the nose as needed.      Drug Regimen Review  Drug regimen was reviewed and remains appropriate with no significant issues identified  Home:     Functional History:    Functional Status:  Mobility:          ADL:    Cognition: Cognition Orientation Level: Oriented X4     Blood pressure (!) 157/81, pulse 81, temperature 98.6 F (37 C), temperature source Oral, resp. rate 17, height 5\' 3"  (1.6 m), weight 78.4 kg, SpO2 96 %. Physical Exam Vitals and nursing note reviewed. Exam conducted with a chaperone present.  Constitutional:      Comments: Awake, alert, sitting up slightly in bed- supine, nurse and family in room, NAD- appropriate  HENT:     Head: Normocephalic and atraumatic.     Comments: Smile equal currently; tongue midline    Right Ear: External ear normal.     Left Ear: External ear normal.     Nose: Rhinorrhea present. No congestion.     Mouth/Throat:     Mouth: Mucous membranes are dry.     Pharynx: Oropharynx is clear. No oropharyngeal exudate.  Eyes:     General:        Right eye: No discharge.        Left eye: No discharge.     Extraocular Movements: Extraocular movements intact.  Cardiovascular:     Comments: RRR- no JVD Pulmonary:     Comments: CTA B/L- no W/R/R- good air movement Abdominal:      Comments: Soft, NT, ND, (+)BS - protuberant- normoactive  Genitourinary:    Comments: R labial irritation/redness- and surrounding area less red; per pt, due to [place]- is covered in powder in groin area, and under breasts Musculoskeletal:     Cervical back: Normal range of motion and neck supple.     Comments: RUE- Bicep, triceps, WE, 5-/5, grip 4+/5 and finger abd 4/5 LUE- biceps, triceps, and WE 5-/5, grip 4/5, and finger abd 4-/5 RLE- HF 4+/5, KE 4+/5, DF 4-/5, and PF 5-/5 LLE- HF 4+/5, KE 4/5, DF 2+/5, and PF 4/5 Has moderate L foot drop noted- from MS  Skin:    Comments: External hemorrhoids- stage II A few bruises on Arms B/L Backside no skin breakdown nor heels B/L   Neurological:     Comments: Ox3- and able to tell me next holiday- even day/date/month and year as well as location and president.  Intact to light touch in all 4 extremities MAS of 2-3 in LLE- no clonus- and maybe 1+-2 at L ankle, , but worse MAS (2-3) at L hip and knee. No spasticity seen on R side.    Psychiatric:     Comments: Appropriate, but had some slowed processing in answering questions, accessing memory.      No results found for this or any previous visit (from the past 48 hour(s)). No results found.     Medical Problem List and Plan: 1.  R hemiparesis secondary to L deep white matter/ corona radiata stroke in setting of L hemiparesis and L foot drop from MS  -patient may  shower  -ELOS/Goals: 12-16 days- min A to supervision 2.  Antithrombotics: -DVT/anticoagulation:  Pharmaceutical: Lovenox  -antiplatelet therapy: Plavix  3. Chronic LBP/Pain Management: Tylenol daily 4. Mood: Team to provide ego support.   -antipsychotic agents: N/A 5. Neuropsych: This patient is not fully capable of making decisions on her own behalf. 6. Skin/Wound Care: Routine pressure relief measures.  7. Fluids/Electrolytes/Nutrition: Monitor I/O. Check lytes in am.  8. H/o depression w/GAD:  Continue Ativan 0.25  mg/hs, Cymbalta 60 mg and Remeron 45 mg/hs 9. H/o MS: on no disease modifying agents; Continue Ampyra daily. Has some spasticity and L foot drop- might benefit from spasticity treatment as well as L AFO?  10. Dyslipidemia: Chol 243/LDL-143  --Continue Lipitor daily 11. Constipation- wants a suppository this evening- will order and also give prn; will add Probiotics per pt request.    Bary Leriche- PA-C 12/13/20  I have personally performed a face to face diagnostic evaluation of this patient and formulated the key components of the plan.  Additionally, I have personally reviewed laboratory data, imaging studies, as well as relevant notes and concur with the physician assistant's documentation above.   The patient's status has not changed from the original H&P.  Any changes in documentation from the acute care chart have been noted above.          Courtney Heys, MD 12/13/2020

## 2020-12-13 NOTE — Care Management Important Message (Signed)
Important Message  Patient Details  Name: Carly [name] MRN: [id number] Date of Birth: 14-Mar-1951   Medicare Important Message Given:  Yes  Talked with KANDISE, RIEHLE, husband by phone [phone] and reviewed the Important Message from Medicare. They are very happy with the discharge plan to CIR and I asked if he would like a copy of the form but he replied no.  I thanked for his time.  Juliann Pulse A Allmond 12/13/2020, 11:00 AM

## 2020-12-13 NOTE — Progress Notes (Signed)
Inpatient Rehabilitation Medication Review by a Pharmacist  A complete drug regimen review was completed for this patient to identify any potential clinically significant medication issues.  Clinically significant medication issues were identified:  No.  Check AMION for pharmacist assigned to patient if future medication questions/issues arise during this admission.  Pharmacist comments:   Time spent performing this drug regimen review (minutes):  10 min.   Blenda Nicely, [place] Pharmacist 12/13/2020 3:37 PM

## 2020-12-13 NOTE — Progress Notes (Signed)
Inpatient Rehabilitation  Patient information reviewed and entered into eRehab system by Lezly Rumpf M. Deondrick Searls, M.A., CCC/SLP, PPS Coordinator.  Information including medical coding, functional ability and quality indicators will be reviewed and updated through discharge.    

## 2020-12-13 NOTE — Discharge Summary (Signed)
Physician Discharge Summary  LULANI BOUR YDX:[id number] DOB: [birth year] DOA: 12/08/2020  PCP: Einar Pheasant, MD  Admit date: 12/08/2020 Discharge date: 12/13/2020  Admitted From: Home Disposition: Acute inpatient rehab  Recommendations for Outpatient Follow-up:  1. Follow up with PCP in 1-2 weeks after discharge 2. Schedule follow-up with neurology on discharge from rehab.  Home Health: Not applicable Equipment/Devices: Not applicable  Discharge Condition: Stable CODE STATUS: Full code Diet recommendation: Low-salt diet  Discharge summary: 70 year old female with history of multiple sclerosis, hypertension, depression and anxiety, GERD and migraine presented to the emergency room with 1 day history of generalized weakness, right more than left.  She noticed decreased grip strength on the right hand and was having trouble picking up things.  She felt dizzy and was about to pass out.  Does have history of multiple sclerosis and generalized weakness.  Patient was admitted to the hospital with acute ischemic stroke and treated for following conditions.  She is transferred to acute inpatient rehab today.  Assessment & Plan of care:   Acute ischemic stroke, left corona radiata and periventricular white matter ischemic infarction: MRI: Multiple white matter ischemic infarction. Carotid duplex, no significant stenosis. 2D echocardiogram with ejection fraction 60-65%.  No embolism.  No shunt. LDL 143, started on Lipitor.  Increasing to high-dose Lipitor.  Hemoglobin A1c 5.7 No indication for treatment. Allergic to aspirin, started on Plavix that will be continued. Outpatient stroke follow-up.  Please reschedule neurology follow-up on discharge from rehab. Aggressive PT OT and referral to rehab.  Essential hypertension: Blood pressures well controlled.  Anxiety/depression: Patient on Cymbalta, Ativan and mirtazapine.  Home medications resumed.  Multiple sclerosis: Patient with  chronic left lower leg weakness.  Currently not on any disease modifying medications for her multiple sclerosis.  Patient does take dalfampridine for her chronic gait dysfunction.  Outpatient neurology follow-up.  Patient is medically stable to transfer to acute inpatient rehab today.    Discharge Diagnoses:  Principal Problem:   Generalized anxiety disorder Active Problems:   Multiple sclerosis (HCC)   BP (high blood pressure)   Depression with anxiety   Acute focal neurological deficit    Discharge Instructions  Discharge Instructions    Diet - low sodium heart healthy   Complete by: As directed    Increase activity slowly   Complete by: As directed      Allergies as of 12/13/2020      Reactions   Sulfamethoxazole-trimethoprim Itching   Asa [aspirin] Swelling   Buspirone Other (See Comments)   Headaches   Levofloxacin Other (See Comments)   "weakness"   Lexapro [escitalopram Oxalate] Other (See Comments)   Weakness   Motrin [ibuprofen] Swelling, Other (See Comments)   Pollen Extract Hives   Ceftin [cefuroxime Axetil] Rash   Penicillins Rash   Sulfa Antibiotics Rash      Medication List    STOP taking these medications   omeprazole 20 MG capsule Commonly known as: PRILOSEC     TAKE these medications   acetaminophen 500 MG tablet Commonly known as: TYLENOL Take 1 tablet (500 mg total) by mouth every 4 (four) hours as needed. What changed: how much to take   atorvastatin 80 MG tablet Commonly known as: LIPITOR Take 1 tablet (80 mg total) by mouth at bedtime.   azelastine 0.1 % nasal spray Commonly known as: ASTELIN Place 1 spray into both nostrils 2 (two) times daily. Use in each nostril as directed   clopidogrel 75 MG tablet Commonly known as:  PLAVIX Take 1 tablet (75 mg total) by mouth daily.   Co Q10 100 MG Caps Take 1 capsule by mouth daily.   D3-1000 PO Take 1,000 Units by mouth daily.   dalfampridine 10 MG Tb12 Take 10 mg by mouth every  12 (twelve) hours.   DEPLIN 15 PO Take 15 mg by mouth daily.   DULoxetine 60 MG capsule Commonly known as: CYMBALTA Take 60 mg by mouth daily.   famotidine 20 MG tablet Commonly known as: PEPCID Take 1 tablet (20 mg total) by mouth 2 (two) times daily.   fexofenadine 180 MG tablet Commonly known as: Allegra Allergy Take 1 tablet (180 mg total) by mouth daily.   fluticasone 50 MCG/ACT nasal spray Commonly known as: FLONASE Place 1 spray into both nostrils daily.   LORazepam 0.5 MG tablet Commonly known as: ATIVAN Take 0.25 mg by mouth at bedtime.   Magnesium Oxide 400 (240 Mg) MG Tabs TAKE ONE TABLET EVERY DAY   mirtazapine 30 MG tablet Commonly known as: REMERON Take 30 mg by mouth at bedtime.   PROBIOTIC ACIDOPHILUS PO Take 1 capsule by mouth daily.   sodium chloride 0.65 % nasal spray Commonly known as: OCEAN Place 1-2 sprays into the nose as needed.       Follow-up Information    Einar Pheasant, MD Follow up in 2 week(s).   Specialty: Internal Medicine Contact information: [address] [place] [address] [phone]              Allergies  Allergen Reactions  . Sulfamethoxazole-Trimethoprim Itching  . Asa [Aspirin] Swelling  . Buspirone Other (See Comments)    Headaches    . Levofloxacin Other (See Comments)    "weakness"  . Lexapro [Escitalopram Oxalate] Other (See Comments)    Weakness   . Motrin [Ibuprofen] Swelling and Other (See Comments)  . Pollen Extract Hives  . Ceftin [Cefuroxime Axetil] Rash  . Penicillins Rash  . Sulfa Antibiotics Rash    Consultations:  Neurology   Procedures/Studies: CT HEAD WO CONTRAST  Result Date: 12/08/2020 CLINICAL DATA:  Arm weakness. EXAM: CT HEAD WITHOUT CONTRAST TECHNIQUE: Contiguous axial images were obtained from the base of the skull through the vertex without intravenous contrast. COMPARISON:  December 21, 2017. FINDINGS: Brain: Mild diffuse cortical atrophy is  noted. Moderate chronic ischemic white matter disease is noted. No mass effect or midline shift is noted. Ventricular size is within normal limits. There is no evidence of mass lesion, hemorrhage or acute infarction. Vascular: No hyperdense vessel or unexpected calcification. Skull: Normal. Negative for fracture or focal lesion. Sinuses/Orbits: No acute finding. Other: None. IMPRESSION: Mild diffuse cortical atrophy. Moderate chronic ischemic white matter disease. No acute intracranial abnormality seen. Electronically Signed   By: Marijo Conception M.D.   On: 12/08/2020 21:25   MR ANGIO HEAD WO CONTRAST  Result Date: 12/09/2020 CLINICAL DATA:  Multiple sclerosis.  TIA.  Memory disturbance. EXAM: MRI HEAD WITHOUT AND WITH CONTRAST MRA HEAD WITHOUT CONTRAST TECHNIQUE: Multiplanar, multiecho pulse sequences of the brain and surrounding structures were obtained without and with intravenous contrast. Angiographic images of the head were obtained using MRA technique without contrast. CONTRAST:  7.39mL GADAVIST GADOBUTROL 1 MMOL/ML IV SOLN COMPARISON:  Head CT earlier tonight.  MRI 03/12/2016 FINDINGS: MRI HEAD FINDINGS Brain: Diffusion imaging shows a somewhat linear region restricted diffusion in the deep white matter of the left hemisphere in the corona radiography. The appearance is most suggestive of a white matter  infarction. Acute multiple sclerosis lesions usually have a more round configuration. Abnormal T2 signal in the pons is stable since previous studies. Cerebral hemispheres show old small vessel infarctions of the thalami and basal ganglia. There is confluent abnormal T2 and FLAIR signal throughout white matter which could be due to a combination of small-vessel disease and chronic demyelinating disease. The majority of these findings appear similar to the study of 2017. There is been slightly more central volume loss with the ventricles being slightly larger. No mass, hemorrhage or extra-axial collection.  There are foci of hemosiderin deposition associated with some of the old white matter insults. After contrast administration, no abnormal enhancement occurs. Vascular: Major vessels at the base of the brain show flow. Skull and upper cervical spine: Negative Sinuses/Orbits: Clear/normal Other: None MRA HEAD FINDINGS Both internal carotid arteries are patent through the skull base and siphon regions. Right internal carotid artery supplies the right middle cerebral artery territory, both anterior cerebral artery territories and large posterior communicating artery. Left internal carotid artery supplies only the middle cerebral artery territory. No proximal MCA stenosis on either side. More distal branch vessels do show atherosclerotic narrowing and irregularity. Both vertebral arteries are patent to the basilar. No basilar stenosis. Posterior circulation branch vessels show flow. There is considerable atherosclerotic irregularity in the PCA branches. IMPRESSION: 1. Acute white matter infarction in the deep white matter of the left corona radiata region. The appearance is much more consistent with a white matter infarction than an acute MS plaque. No mass effect or acute hemorrhage. 2. Extensive abnormal T2 and FLAIR signal throughout the brain as outlined above, similar to the study of 2017. The white matter disease could be due to a combination of small-vessel disease and chronic demyelinating disease. There is probably slight increase in central volume loss with the ventricles being slightly more prominent. 3. MR angiography does not show any correctable proximal stenosis or large vessel occlusion. Distal vessel atherosclerotic irregularity diffusely. This is most severe in the PCA branches. Electronically Signed   By: Nelson Chimes M.D.   On: 12/09/2020 01:23   MR Brain W and Wo Contrast  Result Date: 12/09/2020 CLINICAL DATA:  Multiple sclerosis.  TIA.  Memory disturbance. EXAM: MRI HEAD WITHOUT AND WITH  CONTRAST MRA HEAD WITHOUT CONTRAST TECHNIQUE: Multiplanar, multiecho pulse sequences of the brain and surrounding structures were obtained without and with intravenous contrast. Angiographic images of the head were obtained using MRA technique without contrast. CONTRAST:  7.36mL GADAVIST GADOBUTROL 1 MMOL/ML IV SOLN COMPARISON:  Head CT earlier tonight.  MRI 03/12/2016 FINDINGS: MRI HEAD FINDINGS Brain: Diffusion imaging shows a somewhat linear region restricted diffusion in the deep white matter of the left hemisphere in the corona radiography. The appearance is most suggestive of a white matter infarction. Acute multiple sclerosis lesions usually have a more round configuration. Abnormal T2 signal in the pons is stable since previous studies. Cerebral hemispheres show old small vessel infarctions of the thalami and basal ganglia. There is confluent abnormal T2 and FLAIR signal throughout white matter which could be due to a combination of small-vessel disease and chronic demyelinating disease. The majority of these findings appear similar to the study of 2017. There is been slightly more central volume loss with the ventricles being slightly larger. No mass, hemorrhage or extra-axial collection. There are foci of hemosiderin deposition associated with some of the old white matter insults. After contrast administration, no abnormal enhancement occurs. Vascular: Major vessels at the base of the brain  show flow. Skull and upper cervical spine: Negative Sinuses/Orbits: Clear/normal Other: None MRA HEAD FINDINGS Both internal carotid arteries are patent through the skull base and siphon regions. Right internal carotid artery supplies the right middle cerebral artery territory, both anterior cerebral artery territories and large posterior communicating artery. Left internal carotid artery supplies only the middle cerebral artery territory. No proximal MCA stenosis on either side. More distal branch vessels do show  atherosclerotic narrowing and irregularity. Both vertebral arteries are patent to the basilar. No basilar stenosis. Posterior circulation branch vessels show flow. There is considerable atherosclerotic irregularity in the PCA branches. IMPRESSION: 1. Acute white matter infarction in the deep white matter of the left corona radiata region. The appearance is much more consistent with a white matter infarction than an acute MS plaque. No mass effect or acute hemorrhage. 2. Extensive abnormal T2 and FLAIR signal throughout the brain as outlined above, similar to the study of 2017. The white matter disease could be due to a combination of small-vessel disease and chronic demyelinating disease. There is probably slight increase in central volume loss with the ventricles being slightly more prominent. 3. MR angiography does not show any correctable proximal stenosis or large vessel occlusion. Distal vessel atherosclerotic irregularity diffusely. This is most severe in the PCA branches. Electronically Signed   By: Nelson Chimes M.D.   On: 12/09/2020 01:23   US Carotid Bilateral  Result Date: 12/10/2020 CLINICAL DATA:  Stroke Hypertension EXAM: BILATERAL CAROTID DUPLEX ULTRASOUND TECHNIQUE: Pearline Cables scale imaging, color Doppler and duplex ultrasound were performed of bilateral carotid and vertebral arteries in the neck. COMPARISON:  None. FINDINGS: Criteria: Quantification of carotid stenosis is based on velocity parameters that correlate the residual internal carotid diameter with NASCET-based stenosis levels, using the diameter of the distal internal carotid lumen as the denominator for stenosis measurement. The following velocity measurements were obtained: RIGHT ICA: 72/17 cm/sec CCA: 85/46 cm/sec SYSTOLIC ICA/CCA RATIO:  1.0 ECA: 79 cm/sec LEFT ICA: 62/9 cm/sec CCA: 27/03 cm/sec SYSTOLIC ICA/CCA RATIO:  0.7 ECA: 66 cm/sec RIGHT CAROTID ARTERY: No significant stenosis of the internal carotid artery. RIGHT VERTEBRAL  ARTERY:  Antegrade flow. LEFT CAROTID ARTERY: No significant stenosis of the internal carotid artery. LEFT VERTEBRAL ARTERY:  Antegrade flow. IMPRESSION: No significant stenosis of internal carotid arteries. Electronically Signed   By: Miachel Roux M.D.   On: 12/10/2020 11:13   DG Chest Portable 1 View  Result Date: 12/08/2020 CLINICAL DATA:  Weakness EXAM: PORTABLE CHEST 1 VIEW COMPARISON:  December 20, 2017 FINDINGS: The heart size and mediastinal contours are within normal limits. Aortic knob calcifications. Both lungs are clear. The visualized skeletal structures are unremarkable. IMPRESSION: No active disease. Electronically Signed   By: Prudencio Pair M.D.   On: 12/08/2020 21:17   ECHOCARDIOGRAM COMPLETE  Result Date: 12/11/2020    ECHOCARDIOGRAM REPORT   Patient Name:   SHAKELA DONATI Date of Exam: 12/11/2020 Medical Rec #:  [id number]           Height:       63.0 in Accession #:    [id number]          Weight:       177.9 lb Date of Birth:  [birth year]           BSA:          1.840 m Patient Age:    18 years            BP:  146/91 mmHg Patient Gender: F                   HR:           99 bpm. Exam Location:  [facility] Procedure: 2D Echo, Color Doppler, Cardiac Doppler and Intracardiac            Opacification Agent Indications:     I63.9 Stroke  History:         Patient has no prior history of Echocardiogram examinations.                  MS; Signs/Symptoms:Weakness.  Sonographer:     Charmayne Sheer RDCS (AE) Referring Phys:  [id number] [facility] Diagnosing Phys: Neoma Laming MD  Sonographer Comments: Technically difficult study due to poor echo windows. IMPRESSIONS  1. Left ventricular ejection fraction, by estimation, is 60 to 65%. The left ventricle has normal function. The left ventricle has no regional wall motion abnormalities. Left ventricular diastolic parameters are consistent with Grade I diastolic dysfunction (impaired relaxation).  2. Right ventricular systolic function is normal. The  right ventricular size is normal.  3. Left atrial size was mildly dilated.  4. Right atrial size was mildly dilated.  5. The mitral valve is normal in structure. No evidence of mitral valve regurgitation. No evidence of mitral stenosis.  6. The aortic valve is normal in structure. Aortic valve regurgitation is not visualized. No aortic stenosis is present.  7. The inferior vena cava is normal in size with greater than 50% respiratory variability, suggesting right atrial pressure of 3 mmHg. FINDINGS  Left Ventricle: Left ventricular ejection fraction, by estimation, is 60 to 65%. The left ventricle has normal function. The left ventricle has no regional wall motion abnormalities. Definity contrast agent was given IV to delineate the left ventricular  endocardial borders. The left ventricular internal cavity size was normal in size. There is no left ventricular hypertrophy. Left ventricular diastolic parameters are consistent with Grade I diastolic dysfunction (impaired relaxation). Right Ventricle: The right ventricular size is normal. No increase in right ventricular wall thickness. Right ventricular systolic function is normal. Left Atrium: Left atrial size was mildly dilated. Right Atrium: Right atrial size was mildly dilated. Pericardium: There is no evidence of pericardial effusion. Mitral Valve: The mitral valve is normal in structure. No evidence of mitral valve regurgitation. No evidence of mitral valve stenosis. MV peak gradient, 4.8 mmHg. The mean mitral valve gradient is 2.0 mmHg. Tricuspid Valve: The tricuspid valve is normal in structure. Tricuspid valve regurgitation is not demonstrated. No evidence of tricuspid stenosis. Aortic Valve: The aortic valve is normal in structure. Aortic valve regurgitation is not visualized. No aortic stenosis is present. Aortic valve mean gradient measures 3.0 mmHg. Aortic valve peak gradient measures 5.7 mmHg. Aortic valve area, by VTI measures 2.61 cm. Pulmonic Valve:  The pulmonic valve was normal in structure. Pulmonic valve regurgitation is not visualized. No evidence of pulmonic stenosis. Aorta: The aortic root is normal in size and structure. Venous: The inferior vena cava is normal in size with greater than 50% respiratory variability, suggesting right atrial pressure of 3 mmHg. IAS/Shunts: No atrial level shunt detected by color flow Doppler.  LEFT VENTRICLE PLAX 2D LVOT diam:     1.90 cm     Diastology LV SV:         45          LV e' medial:    4.79 cm/s LV SV Index:  25          LV E/e' medial:  13.1 LVOT Area:     2.84 cm    LV e' lateral:   4.46 cm/s                            LV E/e' lateral: 14.1  LV Volumes (MOD) LV vol d, MOD A4C: 78.9 ml LV vol s, MOD A4C: 29.6 ml LV SV MOD A4C:     78.9 ml AORTIC VALVE                   PULMONIC VALVE AV Area (Vmax):    2.24 cm    PV Vmax:       0.78 m/s AV Area (Vmean):   2.44 cm    PV Vmean:      45.700 cm/s AV Area (VTI):     2.61 cm    PV VTI:        0.089 m AV Vmax:           119.00 cm/s PV Peak grad:  2.4 mmHg AV Vmean:          77.600 cm/s PV Mean grad:  1.0 mmHg AV VTI:            0.174 m AV Peak Grad:      5.7 mmHg AV Mean Grad:      3.0 mmHg LVOT Vmax:         94.00 cm/s LVOT Vmean:        66.800 cm/s LVOT VTI:          0.160 m LVOT/AV VTI ratio: 0.92  AORTA Ao Root diam: 3.40 cm MITRAL VALVE MV Area (PHT): 4.97 cm    SHUNTS MV Area VTI:   1.99 cm    Systemic VTI:  0.16 m MV Peak grad:  4.8 mmHg    Systemic Diam: 1.90 cm MV Mean grad:  2.0 mmHg MV Vmax:       1.10 m/s MV Vmean:      66.6 cm/s MV Decel Time: 153 msec MV E velocity: 62.80 cm/s MV A velocity: 90.60 cm/s MV E/A ratio:  0.69 Neoma Laming MD Electronically signed by Neoma Laming MD Signature Date/Time: 12/11/2020/3:45:40 PM    Final     (Echo, Carotid, EGD, Colonoscopy, ERCP)    Subjective: Patient seen and examined.  Her family friend was at the bedside.  Husband on the speaker phone.  No overnight events. Patient was very motivated that her  right hand weakness is already improving and looking forward for rehab.   Discharge Exam: Vitals:   12/13/20 0259 12/13/20 0749  BP: (!) 156/94 136/76  Pulse: 93 89  Resp: 18 20  Temp: 97.9 F (36.6 C) 98.3 F (36.8 C)  SpO2: 95% 98%   Vitals:   12/12/20 1955 12/12/20 2335 12/13/20 0259 12/13/20 0749  BP: 127/74 (!) 148/82 (!) 156/94 136/76  Pulse: 91 90 93 89  Resp: 18 18 18 20   Temp: 98.6 F (37 C) 98.1 F (36.7 C) 97.9 F (36.6 C) 98.3 F (36.8 C)  TempSrc: Oral     SpO2: 97% 95% 95% 98%  Weight:      Height:        General: Pt is alert, awake, not in acute distress Cardiovascular: RRR, S1/S2 +, no rubs, no gallops Respiratory: CTA bilaterally, no wheezing, no rhonchi Abdominal: Soft, NT, ND, bowel sounds + Extremities:  no edema, no cyanosis Nervous system: Cranial nerves no deficit. Right upper extremity 4/5.  Sensory normal. She has generalized weakness on the lower extremities but mostly bilateral equal.    The results of significant diagnostics from this hospitalization (including imaging, microbiology, ancillary and laboratory) are listed below for reference.     Microbiology: Recent Results (from the past 240 hour(s))  Resp Panel by RT-PCR (Flu A&B, Covid) Nasopharyngeal Swab     Status: None   Collection Time: 12/08/20  8:11 PM   Specimen: Nasopharyngeal Swab; Nasopharyngeal(NP) swabs in vial transport medium  Result Value Ref Range Status   SARS Coronavirus 2 by RT PCR NEGATIVE NEGATIVE Final    Comment: (NOTE) SARS-CoV-2 target nucleic acids are NOT DETECTED.  The SARS-CoV-2 RNA is generally detectable in upper respiratory specimens during the acute phase of infection. The lowest concentration of SARS-CoV-2 viral copies this assay can detect is 138 copies/mL. A negative result does not preclude SARS-Cov-2 infection and should not be used as the sole basis for treatment or other patient management decisions. A negative result may occur with   improper specimen collection/handling, submission of specimen other than nasopharyngeal swab, presence of viral mutation(s) within the areas targeted by this assay, and inadequate number of viral copies(<138 copies/mL). A negative result must be combined with clinical observations, patient history, and epidemiological information. The expected result is Negative.  Fact Sheet for Patients:  EntrepreneurPulse.com.au  Fact Sheet for Healthcare Providers:  IncredibleEmployment.be  This test is no t yet approved or cleared by the [place] FDA and  has been authorized for detection and/or diagnosis of SARS-CoV-2 by FDA under an Emergency Use Authorization (EUA). This EUA will remain  in effect (meaning this test can be used) for the duration of the COVID-19 declaration under Section 564(b)(1) of the Act, 21 U.S.C.section 360bbb-3(b)(1), unless the authorization is terminated  or revoked sooner.       Influenza A by PCR NEGATIVE NEGATIVE Final   Influenza B by PCR NEGATIVE NEGATIVE Final    Comment: (NOTE) The Xpert Xpress SARS-CoV-2/FLU/RSV plus assay is intended as an aid in the diagnosis of influenza from Nasopharyngeal swab specimens and should not be used as a sole basis for treatment. Nasal washings and aspirates are unacceptable for Xpert Xpress SARS-CoV-2/FLU/RSV testing.  Fact Sheet for Patients: EntrepreneurPulse.com.au  Fact Sheet for Healthcare Providers: IncredibleEmployment.be  This test is not yet approved or cleared by the [place] FDA and has been authorized for detection and/or diagnosis of SARS-CoV-2 by FDA under an Emergency Use Authorization (EUA). This EUA will remain in effect (meaning this test can be used) for the duration of the COVID-19 declaration under Section 564(b)(1) of the Act, 21 U.S.C. section 360bbb-3(b)(1), unless the authorization is terminated  or revoked.  Performed at [facility], [address] [place], [place] [address]      Labs: BNP (last 3 results) No results for input(s): BNP in the last 8760 hours. Basic Metabolic Panel: Recent Labs  Lab 12/08/20 1943 12/09/20 0216 12/11/20 0457  NA 138  --  140  K 3.8  --  4.3  CL 104  --  106  CO2 23  --  26  GLUCOSE 149*  --  122*  BUN 11  --  13  CREATININE 0.69  --  0.73  CALCIUM 9.3  --  9.4  MG  --  2.3 2.1  PHOS  --  3.5 4.7*   Liver Function Tests: Recent Labs  Lab  12/08/20 1943 12/11/20 0457  AST 29 29  ALT 26 28  ALKPHOS 100 87  BILITOT 0.7 0.8  PROT 7.4 7.1  ALBUMIN 4.4 4.2   No results for input(s): LIPASE, AMYLASE in the last 168 hours. No results for input(s): AMMONIA in the last 168 hours. CBC: Recent Labs  Lab 12/08/20 1943 12/11/20 0457  WBC 9.3 8.5  NEUTROABS 7.1  --   HGB 13.7 13.8  HCT 41.3 42.8  MCV 84.8 85.9  PLT 299 293   Cardiac Enzymes: Recent Labs  Lab 12/11/20 1350  CKTOTAL 152   BNP: Invalid input(s): POCBNP CBG: Recent Labs  Lab 12/08/20 2041  GLUCAP 116*   D-Dimer No results for input(s): DDIMER in the last 72 hours. Hgb A1c No results for input(s): HGBA1C in the last 72 hours. Lipid Profile No results for input(s): CHOL, HDL, LDLCALC, TRIG, CHOLHDL, LDLDIRECT in the last 72 hours. Thyroid function studies No results for input(s): TSH, T4TOTAL, T3FREE, THYROIDAB in the last 72 hours.  Invalid input(s): FREET3 Anemia work up No results for input(s): VITAMINB12, FOLATE, FERRITIN, TIBC, IRON, RETICCTPCT in the last 72 hours. Urinalysis    Component Value Date/Time   COLORURINE YELLOW (A) 12/09/2020 0216   APPEARANCEUR CLEAR (A) 12/09/2020 0216   LABSPEC 1.014 12/09/2020 0216   PHURINE 5.0 12/09/2020 0216   GLUCOSEU NEGATIVE 12/09/2020 0216   GLUCOSEU NEGATIVE 08/16/2018 1633   HGBUR NEGATIVE 12/09/2020 0216   BILIRUBINUR NEGATIVE 12/09/2020 0216   KETONESUR NEGATIVE 12/09/2020 0216    PROTEINUR NEGATIVE 12/09/2020 0216   UROBILINOGEN 0.2 08/16/2018 1633   NITRITE NEGATIVE 12/09/2020 0216   LEUKOCYTESUR NEGATIVE 12/09/2020 0216   Sepsis Labs Invalid input(s): PROCALCITONIN,  WBC,  LACTICIDVEN Microbiology Recent Results (from the past 240 hour(s))  Resp Panel by RT-PCR (Flu A&B, Covid) Nasopharyngeal Swab     Status: None   Collection Time: 12/08/20  8:11 PM   Specimen: Nasopharyngeal Swab; Nasopharyngeal(NP) swabs in vial transport medium  Result Value Ref Range Status   SARS Coronavirus 2 by RT PCR NEGATIVE NEGATIVE Final    Comment: (NOTE) SARS-CoV-2 target nucleic acids are NOT DETECTED.  The SARS-CoV-2 RNA is generally detectable in upper respiratory specimens during the acute phase of infection. The lowest concentration of SARS-CoV-2 viral copies this assay can detect is 138 copies/mL. A negative result does not preclude SARS-Cov-2 infection and should not be used as the sole basis for treatment or other patient management decisions. A negative result may occur with  improper specimen collection/handling, submission of specimen other than nasopharyngeal swab, presence of viral mutation(s) within the areas targeted by this assay, and inadequate number of viral copies(<138 copies/mL). A negative result must be combined with clinical observations, patient history, and epidemiological information. The expected result is Negative.  Fact Sheet for Patients:  EntrepreneurPulse.com.au  Fact Sheet for Healthcare Providers:  IncredibleEmployment.be  This test is no t yet approved or cleared by the [place] FDA and  has been authorized for detection and/or diagnosis of SARS-CoV-2 by FDA under an Emergency Use Authorization (EUA). This EUA will remain  in effect (meaning this test can be used) for the duration of the COVID-19 declaration under Section 564(b)(1) of the Act, 21 U.S.C.section 360bbb-3(b)(1), unless the  authorization is terminated  or revoked sooner.       Influenza A by PCR NEGATIVE NEGATIVE Final   Influenza B by PCR NEGATIVE NEGATIVE Final    Comment: (NOTE) The Xpert Xpress SARS-CoV-2/FLU/RSV plus assay is intended as an  aid in the diagnosis of influenza from Nasopharyngeal swab specimens and should not be used as a sole basis for treatment. Nasal washings and aspirates are unacceptable for Xpert Xpress SARS-CoV-2/FLU/RSV testing.  Fact Sheet for Patients: EntrepreneurPulse.com.au  Fact Sheet for Healthcare Providers: IncredibleEmployment.be  This test is not yet approved or cleared by the [place] FDA and has been authorized for detection and/or diagnosis of SARS-CoV-2 by FDA under an Emergency Use Authorization (EUA). This EUA will remain in effect (meaning this test can be used) for the duration of the COVID-19 declaration under Section 564(b)(1) of the Act, 21 U.S.C. section 360bbb-3(b)(1), unless the authorization is terminated or revoked.  Performed at [facility], [address] [place], [place] [address]      Time coordinating discharge:  32 minutes  SIGNED:   Barb Merino, MD  [facility] 12/13/2020, 10:48 AM

## 2020-12-13 NOTE — Progress Notes (Signed)
Inpatient Rehabilitation Admissions Coordinator  I have CIR bed /Inpt rehab at [facility] available to admit patient to today. I spoke with spouse by phone and he is aware and in agreement. I have notified acute team, Dr. Sloan Leiter and TOC. I will make the arrangements to admit and call [facility] when bed is available at [facility] today.Dr. Dagoberto Ligas will be receiving MD and to room 5C07.  Danne Baxter, RN, MSN Rehab Admissions Coordinator [phone] 12/13/2020 10:18 AM

## 2020-12-13 NOTE — Progress Notes (Signed)
[name], MD  Physician  Physical Medicine and Rehabilitation  Consult Note     Signed  Date of Service:  12/11/2020 10:27 AM      Related encounter: ED to Hosp-Admission (Discharged) from 12/08/2020 in [place] (1C)       Signed      Expand All Collapse All     Show:Clear all [x] Manual[x] Template[] Copied  Added by: [x] Raulkar, Clide Deutscher, MD   [] Hover for details           Physical Medicine and Rehabilitation Consult Reason for Consult: Impaired mobility and ADLs Referring Physician: Shawna Clamp, MD     HPI: Carly [name] is a 70 y.o.right-handed female admitted from home on 12/08/20 with complaints of weakness. At home while exercising she felt bilateral weakness especially in right>left grip strength. She was found to have a white matter infarction. Current deficits include right upper extremity weakness and left lower extremity weakness. Physical Medicine & Rehabilitation was consulted to assess candidacy for CIR.      Review of Systems  Constitutional: Negative.   HENT: Negative.   Eyes: Negative.   Respiratory: Negative.   Cardiovascular: Negative.   Gastrointestinal: Negative.   Genitourinary: Negative.   Musculoskeletal: Negative.   Skin: Negative.   Neurological: Positive for weakness.  Endo/Heme/Allergies: Negative.   Psychiatric/Behavioral: Positive for depression. The patient is nervous/anxious.         Past Medical History:  Diagnosis Date  . Allergy    . Anxiety    . Aspiration pneumonia ([place])    . Depression    . Frequent headaches      H/O  . GERD (gastroesophageal reflux disease)    . History of chicken pox    . History of colon polyps    . Hx of migraines    . Multiple sclerosis ([place])    . PONV (postoperative nausea and vomiting)           Past Surgical History:  Procedure Laterality Date  . BACK SURGERY      . CHOLECYSTECTOMY      . COLONOSCOPY WITH PROPOFOL N/A 05/18/2017     Procedure: COLONOSCOPY WITH PROPOFOL;  Surgeon: [name], MD;  Location: [facility] ENDOSCOPY;  Service: Endoscopy;  Laterality: N/A;  . FOOT SURGERY   2015  . GALLBLADDER SURGERY   2008  . HARDWARE REMOVAL Left 02/14/2016    Procedure: LEFT FOOT REMOVAL DEEP IMPLANT;  Surgeon: [name], MD;  Location: [place];  Service: Orthopedics;  Laterality: Left;  . HERNIA REPAIR        Inguinal Hernia Repair  . SPINE SURGERY   2014         Family History  Problem Relation Age of Onset  . Arthritis Mother    . Hypertension Mother    . Macular degeneration Mother    . Hypertension Father    . Hyperlipidemia Father    . Heart disease Maternal Grandfather    . Diabetes Maternal Grandfather    . Kidney disease Paternal Grandmother      Social History:  reports that she has never smoked. She has never used smokeless tobacco. She reports that she does not drink alcohol and does not use drugs. Allergies:       Allergies  Allergen Reactions  . Sulfamethoxazole-Trimethoprim Itching  . Asa [Aspirin] Swelling  . Buspirone Other (See Comments)      Headaches      . Levofloxacin Other (  See Comments)      "weakness"  . Lexapro [Escitalopram Oxalate] Other (See Comments)      Weakness    . Motrin [Ibuprofen] Swelling and Other (See Comments)  . Pollen Extract Hives  . Ceftin [Cefuroxime Axetil] Rash  . Penicillins Rash  . Sulfa Antibiotics Rash          Medications Prior to Admission  Medication Sig Dispense Refill  . acetaminophen (TYLENOL) 500 MG tablet Take 1,000 mg by mouth every 4 (four) hours as needed.      Marland Kitchen azelastine (ASTELIN) 0.1 % nasal spray Place 1 spray into both nostrils 2 (two) times daily. Use in each nostril as directed 30 mL 1  . Cholecalciferol (D3-1000 PO) Take 1,000 Units by mouth daily.      . Coenzyme Q10 (CO Q10) 100 MG CAPS Take 1 capsule by mouth daily.      Marland Kitchen dalfampridine 10 MG TB12 Take 10 mg by mouth every 12 (twelve) hours.      .  DULoxetine (CYMBALTA) 60 MG capsule Take 60 mg by mouth daily.      . fexofenadine (ALLEGRA ALLERGY) 180 MG tablet Take 1 tablet (180 mg total) by mouth daily. 30 tablet 2  . fluticasone (FLONASE) 50 MCG/ACT nasal spray Place 1 spray into both nostrils daily. 16 g 2  . L-Methylfolate-Algae (DEPLIN 15 PO) Take 15 mg by mouth daily.      . Lactobacillus (PROBIOTIC ACIDOPHILUS PO) Take 1 capsule by mouth daily.      Marland Kitchen LORazepam (ATIVAN) 0.5 MG tablet Take 0.25 mg by mouth at bedtime.      . Magnesium Oxide 400 (240 Mg) MG TABS TAKE ONE TABLET EVERY DAY 30 tablet 1  . mirtazapine (REMERON) 30 MG tablet Take 30 mg by mouth at bedtime.      Marland Kitchen omeprazole (PRILOSEC) 20 MG capsule Take 20 mg by mouth 2 (two) times daily before a meal.      . sodium chloride (OCEAN) 0.65 % nasal spray Place 1-2 sprays into the nose as needed.      . B Complex Vitamins (VITAMIN B COMPLEX PO) Take by mouth. (Patient not taking: No sig reported)      . Calcium-Phosphorus-Vitamin D (CITRACAL +D3 PO) Take by mouth. 2 tablets twice a day (Patient not taking: No sig reported)      . hydrocortisone (ANUSOL-HC) 25 MG suppository Place 1 suppository (25 mg total) rectally 2 (two) times daily. (Patient not taking: No sig reported) 12 suppository 0  . hydrocortisone 2.5 % cream Apply topically 2 (two) times daily as needed (Rash). (Patient not taking: No sig reported) 30 g 11  . ondansetron (ZOFRAN ODT) 4 MG disintegrating tablet Take 1 tablet (4 mg total) by mouth every 8 (eight) hours as needed for nausea or vomiting. (Patient not taking: No sig reported) 30 tablet 0  . oxazepam (SERAX) 10 MG capsule Take 10 mg by mouth at bedtime as needed for sleep or anxiety. (Patient not taking: No sig reported)      . tiZANidine (ZANAFLEX) 4 MG tablet Take 1 tablet by mouth 3 (three) times daily as needed. (Patient not taking: No sig reported)      . traZODone (DESYREL) 50 MG tablet Take 50 mg by mouth at bedtime.  (Patient not taking: No sig  reported)   12      Home: Home Living Family/patient expects to be discharged to:: Private residence Living Arrangements: Spouse/significant other (boyfriend "Bill") Available Help at  Discharge: Family,Available 24 hours/day,Personal care attendant,Available PRN/intermittently (PCA 8 hours/day 3x/week) Type of Home: House Home Access: Stairs to enter [employer] of Steps: 4 Entrance Stairs-Rails: Right Home Layout: Multi-level,Able to live on main level with bedroom/bathroom Bathroom Accessibility: Yes Home Equipment: Walker - 4 wheels,Grab bars - toilet,Grab bars - tub/shower (rollator)  Functional History: Prior Function Level of Independence: Independent with assistive device(s),Needs assistance Gait / Transfers Assistance Needed: Functional mobility of household distances with rollator but pt endorses sitting on rollator & being pushed around. Requires assistance for balance with stair negotiation. ADL's / Homemaking Assistance Needed: Pt reports being independent with dressing and bathing and that PCA assists with cooking and cleaning. Comments: Pt has hx of MS & presents with rigid L knee (difficult to flex L knee even with PROM) & reports she has a L AFO to use 2/2 foot drop. Functional Status:  Mobility: Bed Mobility Overal bed mobility: Needs Assistance Bed Mobility: Supine to Sit Supine to sit: Mod assist,Max assist,HOB elevated Sit to supine: Max assist,HOB elevated General bed mobility comments: Increased time to perform. Poor core strength with posterior/R lateral lean in sitting Transfers Overall transfer level: Needs assistance Equipment used: Rolling walker (2 wheeled) Transfers: Sit to/from Stand Sit to Stand: Mod assist,From elevated surface Stand pivot transfers: Mod assist,Max assist General transfer comment: pt stood 3 x EOB prior to standing and taking steps to recliner. Pt has knee buckling and required mod-max assist to safely advance LEs. heavy  use of RW for support Ambulation/Gait Ambulation/Gait assistance: Max assist Gait Distance (Feet): 5 Feet Assistive device: Rolling walker (2 wheeled) Gait Pattern/deviations: Step-to pattern,Ataxic,Trunk flexed General Gait Details: Pt was able to take ~ 5 steps from EOB to recliner wth max assist and vcs for progression. Poor RLE advancement with poor L wt shift Gait velocity: decreased   ADL: ADL Overall ADL's : Needs assistance/impaired Grooming: Brushing hair,Moderate assistance,Sitting Grooming Details (indicate cue type and reason): To wash hair with shower cap, dry hair with towel, and brush hair while sitting EOB. Required HHA and proximal RUE/trunk support to engage R UE in overhead movements Lower Body Dressing: Maximal assistance,Bed level Lower Body Dressing Details (indicate cue type and reason): to don socks   Cognition: Cognition Overall Cognitive Status: Within Functional Limits for tasks assessed Orientation Level: Oriented X4 Cognition Arousal/Alertness: Awake/alert Behavior During Therapy: WFL for tasks assessed/performed Overall Cognitive Status: Within Functional Limits for tasks assessed General Comments: Pt is A and oriented however states she,"feels druged." Was able to follow commands consistently throughout.   Blood pressure (!) 146/91, pulse 83, temperature 98.2 F (36.8 C), temperature source Oral, resp. rate 15, height 5\' 3"  (1.6 m), weight 80.7 kg, SpO2 99 %. Physical Exam  Gen: no distress, normal appearing, family at bedside HEENT: oral mucosa pink and moist, NCAT Cardio: Reg rate Chest: normal effort, normal rate of breathing Abd: soft, non-distended Ext: no edema Psych: pleasant, normal affect Skin: intact Neuro: Alert and oriented x3. Delayed processing. 4/5 strength RUE and LUE, with the exception of 3/5 right WE and hand grip. 5/5 in other extremities.    Lab Results Last 24 Hours       Results for orders placed or performed during the  hospital encounter of 12/08/20 (from the past 24 hour(s))  CBC     Status: None    Collection Time: 12/11/20  4:57 AM  Result Value Ref Range    WBC 8.5 4.0 - 10.5 K/uL    RBC 4.98 3.87 -  5.11 MIL/uL    Hemoglobin 13.8 12.0 - 15.0 g/dL    HCT 42.8 36.0 - 46.0 %    MCV 85.9 80.0 - 100.0 fL    MCH 27.7 26.0 - 34.0 pg    MCHC 32.2 30.0 - 36.0 g/dL    RDW 14.8 11.5 - 15.5 %    Platelets 293 150 - 400 K/uL    nRBC 0.0 0.0 - 0.2 %  Comprehensive metabolic panel     Status: Abnormal    Collection Time: 12/11/20  4:57 AM  Result Value Ref Range    Sodium 140 135 - 145 mmol/L    Potassium 4.3 3.5 - 5.1 mmol/L    Chloride 106 98 - 111 mmol/L    CO2 26 22 - 32 mmol/L    Glucose, Bld 122 (H) 70 - 99 mg/dL    BUN 13 8 - 23 mg/dL    Creatinine, Ser 0.73 0.44 - 1.00 mg/dL    Calcium 9.4 8.9 - 10.3 mg/dL    Total Protein 7.1 6.5 - 8.1 g/dL    Albumin 4.2 3.5 - 5.0 g/dL    AST 29 15 - 41 U/L    ALT 28 0 - 44 U/L    Alkaline Phosphatase 87 38 - 126 U/L    Total Bilirubin 0.8 0.3 - 1.2 mg/dL    GFR, Estimated >60 >60 mL/min    Anion gap 8 5 - 15  Magnesium     Status: None    Collection Time: 12/11/20  4:57 AM  Result Value Ref Range    Magnesium 2.1 1.7 - 2.4 mg/dL  Phosphorus     Status: Abnormal    Collection Time: 12/11/20  4:57 AM  Result Value Ref Range    Phosphorus 4.7 (H) 2.5 - 4.6 mg/dL       Imaging Results (Last 48 hours)  US Carotid Bilateral   Result Date: 12/10/2020 CLINICAL DATA:  Stroke Hypertension EXAM: BILATERAL CAROTID DUPLEX ULTRASOUND TECHNIQUE: Pearline Cables scale imaging, color Doppler and duplex ultrasound were performed of bilateral carotid and vertebral arteries in the neck. COMPARISON:  None. FINDINGS: Criteria: Quantification of carotid stenosis is based on velocity parameters that correlate the residual internal carotid diameter with NASCET-based stenosis levels, using the diameter of the distal internal carotid lumen as the denominator for stenosis measurement. The  following velocity measurements were obtained: RIGHT ICA: 72/17 cm/sec CCA: 16/10 cm/sec SYSTOLIC ICA/CCA RATIO:  1.0 ECA: 79 cm/sec LEFT ICA: 62/9 cm/sec CCA: 96/04 cm/sec SYSTOLIC ICA/CCA RATIO:  0.7 ECA: 66 cm/sec RIGHT CAROTID ARTERY: No significant stenosis of the internal carotid artery. RIGHT VERTEBRAL ARTERY:  Antegrade flow. LEFT CAROTID ARTERY: No significant stenosis of the internal carotid artery. LEFT VERTEBRAL ARTERY:  Antegrade flow. IMPRESSION: No significant stenosis of internal carotid arteries. Electronically Signed   By: Miachel Roux M.D.   On: 12/10/2020 11:13         Assessment/Plan: Diagnosis: White matter infarction, left side 1. Does the need for close, 24 hr/day medical supervision in concert with the patient's rehab needs make it unreasonable for this patient to be served in a less intensive setting? Yes 2. Co-Morbidities requiring supervision/potential complications:  1. Obesity (BMI 31.52): provided dietary education regarding this risk factor for stroke 2. MS: Discussed symptoms- predominantly LLE weakness.  3. HTN: Monitor BP TID 4. Depression 5. Anxiety 3. Due to bladder management, bowel management, safety, skin/wound care, disease management, medication administration, pain management and patient education, does the patient require 24  hr/day rehab nursing? Yes 4. Does the patient require coordinated care of a physician, rehab nurse, therapy disciplines of PT, OT, SLP to address physical and functional deficits in the context of the above medical diagnosis(es)? Yes Addressing deficits in the following areas: balance, endurance, locomotion, strength, transferring, bowel/bladder control, bathing, dressing, feeding, grooming, toileting and psychosocial support, cognition 5. Can the patient actively participate in an intensive therapy program of at least 3 hrs of therapy per day at least 5 days per week? Yes 6. The potential for patient to make measurable gains while on  inpatient rehab is excellent 7. Anticipated functional outcomes upon discharge from inpatient rehab are S with PT, S with OT, S with SLP. 8. Estimated rehab length of stay to reach the above functional goals is: 12-16 days 9. Anticipated discharge destination: Home 10. Overall Rehab/Functional Prognosis: excellent   RECOMMENDATIONS: This patient's condition is appropriate for continued rehabilitative care in the following setting: CIR Patient has agreed to participate in recommended program. Yes Note that insurance prior authorization may be required for reimbursement for recommended care.   Comment: Thank you for this consult. Admission coordinator to follow.    I have personally performed a face to face diagnostic evaluation, including, but not limited to relevant history and physical exam findings, of this patient and developed relevant assessment and plan.  Additionally, I have reviewed and concur with the physician assistant's documentation above.   Leeroy Cha, MD   [name], MD 12/11/2020           Routing History              Note Details  Author Ranell Patrick, Clide Deutscher, MD File Time 12/11/2020  4:18 PM  Author Type Physician Status Signed  Last Editor [name], MD Service Physical Medicine and [place] # [id number] Admit Date 12/13/2020

## 2020-12-13 NOTE — Progress Notes (Signed)
Pt arrived to unit via EMS, pt is alert and oriented and able to make needs known, no c/o pain, generalized weakness with hx of MS for approx 14 years, oriented to rehab

## 2020-12-13 NOTE — Progress Notes (Signed)
Cristina Gong, RN  Rehab Admission Coordinator  Physical Medicine and Rehabilitation  PMR Pre-admission     Signed  Date of Service:  12/12/2020  4:49 PM      Related encounter: ED to Hosp-Admission (Discharged) from 12/08/2020 in [place] (1C)       Signed          Show:Clear all [x] Manual[x] Template[] Copied  Added by: [x] Cristina Gong, RN   [] Hover for details  PMR Admission Coordinator Pre-Admission Assessment   Patient: Carly [name] is an 70 y.o., female MRN: [id number] DOB: 25-Nov-1950 Height: 5\' 3"  (160 cm) Weight: 80.7 kg                                                                                                                                                  Insurance Information HMO:  PPO:      PCP:      IPA:      80/20:      OTHER:  PRIMARY: Medicare a and b      Policy#: [id number]      Subscriber: pt Benefits:  Phone #: passport one online     Name: 12/12/2020 Eff. Date: 07/13/2009     Deduct: $1556      Out of Pocket Max: none      Life Max: none  CIR: 100%      SNF: 20 full days Outpatient: 80%     Co-Pay: 20% Home Health: 100%      Co-Pay: none DME: 80%     Co-Pay: 20% Providers: pt choice  SECONDARY: [place] [employer] [place]      Policy#: [id number]   Financial Counselor:       Phone#:    The "Data Collection Information Summary" for patients in Inpatient Rehabilitation Facilities with attached "Privacy Act [place] Records" was provided and verbally reviewed with: Patient and Family   Emergency Contact Information         Contact Information     Name Relation Home Work Mobile    Rocque,Bill Spouse [phone]   [phone]       Current Medical History  Patient Admitting Diagnosis: CVA   History of Present Illness: 70 year old female with medical history significant for MS, HTN, memory changes, degenerative disease post lumbar fusion, depression , anxiety, GERD and migraines.  Presented 12/08/2020 with 1 day history of generalized weakness, right more than left. Grip strength right hand and having trouble picking up things. Dizziness noted.    Neurology consulted and imaging revealed acute left corona radiata and periventricular white matter ischemic infarction. Small vessel distribution and severe chronic leukoaraiosis also noted. Appearing most consistent with either severe chronic small vessel ischemic changes , an atypical appearance for MS, or mixed pathology. MRA with diffuse atherosclerotic irregularity. Carotid duplex with no significant stenosis.  TTE with an EF of 60 to 65%. LDL of 143 and started on Atorvastatin. Allergic to ASA and therefore started on Plavix.  MS with chronic lower leg weakness. Currently not on any disease modifying meds. Patient does take Alphaprodine for he chronic gait dysfunction. Outpatient Neurology follow up recommended.      Complete NIHSS TOTAL: 3 Glasgow Coma Scale Score: 15   Past Medical History      Past Medical History:  Diagnosis Date  . Allergy    . Anxiety    . Aspiration pneumonia ([place])    . Depression    . Frequent headaches      H/O  . GERD (gastroesophageal reflux disease)    . History of chicken pox    . History of colon polyps    . Hx of migraines    . Multiple sclerosis ([place])    . PONV (postoperative nausea and vomiting)        Family History  family history includes Arthritis in her mother; Diabetes in her maternal grandfather; Heart disease in her maternal grandfather; Hyperlipidemia in her father; Hypertension in her father and mother; Kidney disease in her paternal grandmother; Macular degeneration in her mother.   Prior Rehab/Hospitalizations:  Has the patient had prior rehab or hospitalizations prior to admission? Yes   Has the patient had major surgery during 100 days prior to admission? No   Current Medications    Current Facility-Administered Medications:  .  acetaminophen (TYLENOL) tablet  650 mg, 650 mg, Oral, Q4H PRN, 650 mg at 12/12/20 1933 **OR** acetaminophen (TYLENOL) 160 MG/5ML solution 650 mg, 650 mg, Per Tube, Q4H PRN **OR** acetaminophen (TYLENOL) suppository 650 mg, 650 mg, Rectal, Q4H PRN, Marcelyn Bruins, MD .  atorvastatin (LIPITOR) tablet 80 mg, 80 mg, Oral, QHS, Donnetta Simpers, MD, 80 mg at 12/12/20 2108 .  clopidogrel (PLAVIX) tablet 75 mg, 75 mg, Oral, Daily, Donnetta Simpers, MD, 75 mg at 12/12/20 0908 .  dalfampridine TB12 10 mg, 10 mg, Oral, Q12H, Marcelyn Bruins, MD, 10 mg at 12/12/20 2128 .  DULoxetine (CYMBALTA) DR capsule 60 mg, 60 mg, Oral, Daily, Marcelyn Bruins, MD, 60 mg at 12/12/20 0908 .  enoxaparin (LOVENOX) injection 42.5 mg, 0.5 mg/kg, Subcutaneous, Q24H, Marcelyn Bruins, MD, 42.5 mg at 12/12/20 2111 .  fluticasone (FLONASE) 50 MCG/ACT nasal spray 1 spray, 1 spray, Each Nare, Daily, Marcelyn Bruins, MD, 1 spray at 12/12/20 [phone] .  LORazepam (ATIVAN) tablet 0.25 mg, 0.25 mg, Oral, QHS, Marcelyn Bruins, MD, 0.25 mg at 12/12/20 2108 .  mirtazapine (REMERON) tablet 45 mg, 45 mg, Oral, Daily, Clapacs, John T, MD, 45 mg at 12/12/20 2108 .  senna-docusate (Senokot-S) tablet 1 tablet, 1 tablet, Oral, QHS PRN, Marcelyn Bruins, MD   Facility-Administered Medications Ordered in Other Encounters:  .  0.9 %  sodium chloride infusion, , Intravenous, Continuous, Corcoran, Melissa C, MD, Last Rate: 10 mL/hr at 04/25/16 0855, New Bag at 04/25/16 0855 .  acetaminophen (TYLENOL) tablet 650 mg, 650 mg, Oral, Once, Corcoran, Drue Second, MD   Patients Current Diet:     Diet Order                      Diet Heart Room service appropriate? Yes; Fluid consistency: Thin  Diet effective now                      Precautions / Restrictions Precautions Precautions: Fall Precaution Comments:  R hemi Restrictions Weight Bearing Restrictions: No    Has the patient had 2 or more falls or a fall with injury in the past year?No   Prior  Activity Level Limited Community (1-2x/wk): limited over past 5 years with her MS dx. Spouse and aide report that she was on person transfer to seated rollator. Typically transfer only or few steps. They would mobilize her seated on the rollator. They are hopeful that she will be motivated to become more mobile and not so sedentary with intensive rehab   Prior Functional Level Prior Function Level of Independence: Independent with assistive device(s),Needs assistance Gait / Transfers Assistance Needed: Functional mobility of household distances with rollator but pt endorses sitting on rollator & being pushed around. Requires assistance for balance with stair negotiation. ADL's / Homemaking Assistance Needed: her aide assists with all adls Comments: Pt has hx of MS & presents with rigid L knee (difficult to flex L knee even with PROM) & reports she has a L AFO to use 2/2 foot drop.   Self Care: Did the patient need help bathing, dressing, using the toilet or eating?  Needed some help   Indoor Mobility: Did the patient need assistance with walking from room to room (with or without device)? Needed some help   Stairs: Did the patient need assistance with internal or external stairs (with or without device)? Needed some help   Functional Cognition: Did the patient need help planning regular tasks such as shopping or remembering to take medications? Needed some help   Home Assistive Devices / [place]edy Devices/Equipment: Walker (specify type),Cane (specify quad or straight),Brace (specify type) (quad, 4 wheel walker, brace to help walk) Home Equipment: Walker - 4 wheels,Grab bars - toilet,Grab bars - tub/shower (rollator)   Prior Device Use: Indicate devices/aids used by the patient prior to current illness, exacerbation or injury? Walker   Current Functional Level Cognition   Overall Cognitive Status: Within Functional Limits for tasks assessed Orientation Level: Oriented to  person,Oriented to place,Oriented to time,Disoriented to situation General Comments: Motivated to participate in therapy throughout. Tearful at times and vocalizing concerns regarding regaining R UE/LE use overtime    Extremity Assessment (includes Sensation/Coordination)   Upper Extremity Assessment: RUE deficits/detail RUE Deficits / Details: Slight inattention to RUE during functional tasks. Strength: 3/5 grip, 4-/5 elbow flexion/extension, and at least 3/5 shoulder flexion RUE Sensation: WNL  Lower Extremity Assessment: Defer to PT evaluation RLE Deficits / Details: grossly 3-/5 as pt initially able to weight bear but with significant weakness as B knees begin to flex with inability to extend them in standing LLE Deficits / Details: LLE foot drop 2/2 hx of MS (pt reports she has an AFO at home she can use) but able to achieve neutral in standing with shoe donned, increased tone in LLE with difficulty performing L knee PROM flexion while pt is long sitting in recliner with pt reporting "it gets like that when I get nervous"     ADLs   Overall ADL's : Needs assistance/impaired Grooming: Wash/dry face,Set up,Brushing hair,Minimal assistance,Sitting Grooming Details (indicate cue type and reason): Pt able to engage R UE in grooming task via HHA from L UE. Required increased assistance to brush back of head Lower Body Dressing: Maximal assistance,Bed level Lower Body Dressing Details (indicate cue type and reason): to don socks Functional mobility during ADLs: Moderate assistance,+2 for physical assistance (stand pivot transfer bed>chair) General ADL Comments: Upon arrival to room, pt receiving bed-level assistance from  nurse tech for bathing and dressing     Mobility   Overal bed mobility: Needs Assistance Bed Mobility: Supine to Sit Supine to sit: Min assist,HOB elevated Sit to supine: Max assist,HOB elevated General bed mobility comments: Min A for trunk elevation with HOB slightly  elevated     Transfers   Overall transfer level: Needs assistance Equipment used: None Transfers: Stand Pivot Transfers Sit to Stand: Mod assist Stand pivot transfers: Mod assist,+2 physical assistance General transfer comment: Pt required multimodal cues for sequencing LLE and safe hand placement during transfer     Ambulation / Gait / Stairs / Wheelchair Mobility   Ambulation/Gait Ambulation/Gait assistance: Mod assist Gait Distance (Feet): 8 Feet Assistive device: Rolling walker (2 wheeled) Gait Pattern/deviations: Trunk flexed,Narrow base of support General Gait Details: Pt ambulated 1 x 5 ft then 1 x 8 ft with RW + mod assist. vcs for upright posture throughout. alot of cues for lateral wt shifting to allow opposite LE advancement Gait velocity: decreased     Posture / Balance Dynamic Sitting Balance Sitting balance - Comments: Able to sit EOB with feet supporting and RUE on bed for support Balance Overall balance assessment: Needs assistance Sitting-balance support: Feet supported,Single extremity supported Sitting balance-Leahy Scale: Fair Sitting balance - Comments: Able to sit EOB with feet supporting and RUE on bed for support Postural control: Posterior lean,Right lateral lean Standing balance support: No upper extremity supported,During functional activity Standing balance-Leahy Scale: Poor Standing balance comment: pt is at high fall risk due to balance deficits in standing     Special needs/care consideration      Previous Home Environment  Living Arrangements: Spouse/significant other  Lives With: Spouse Available Help at Discharge: Family,Available 24 hours/day,Personal care attendant,Available PRN/intermittently Type of Home: House Home Layout: Multi-level,Able to live on main level with bedroom/bathroom Home Access: Stairs to enter Entrance Stairs-Rails: Right Entrance Stairs-Number of Steps: 4 Bathroom Shower/Tub: Development worker, community: Yes How Accessible: Accessible via walker Home Care Services: No Additional Comments: has aide 10a until 5p daily, [facility]   Discharge Living Setting Plans for Discharge Living Setting: Patient's home,Lives with (comment) (spouse) Type of Home at Discharge: House Discharge Home Layout: Two level,Able to live on main level with bedroom/bathroom Discharge Home Access: Stairs to enter Entrance Stairs-Rails: Right Entrance Stairs-Number of Steps: 4 Discharge Bathroom Shower/Tub: Tub/shower unit,Curtain Discharge Bathroom Toilet: Standard Discharge Bathroom Accessibility: Yes How Accessible: Accessible via walker Does the patient have any problems obtaining your medications?: No   Social/Family/Support Systems Contact Information: spouse, Engineer, technical sales Anticipated Caregiver: spouse and hired Engineer, production, Advertising account planner Information: see above Ability/Limitations of Caregiver: min assist level Caregiver Availability: 24/7 Discharge Plan Discussed with Primary Caregiver: Yes Is Caregiver In Agreement with Plan?: Yes Does Caregiver/Family have Issues with Lodging/Transportation while Pt is in Rehab?: No   Goals Patient/Family Goal for Rehab: min assist with PT and OT Expected length of stay: ELOS 12 to 16 days Pt/Family Agrees to Admission and willing to participate: Yes Program Orientation Provided & Reviewed with Pt/Caregiver Including Roles  & Responsibilities: Yes   Decrease burden of Care through IP rehab admission: n/a   Possible need for SNF placement upon discharge:not anticipated   Patient Condition: This patient's condition remains as documented in the consult dated 12/11/2020, in which the Rehabilitation Physician determined and documented that the patient's condition is appropriate for intensive rehabilitative care in an inpatient rehabilitation facility. Will admit to inpatient rehab today.   Preadmission Screen  Completed By:   Cleatrice Burke, RN, 12/13/2020 10:14 AM ______________________________________________________________________   Discussed status with Dr. Dagoberto Ligas on 12/13/2020 at  1015 and received approval for admission today.   Admission Coordinator:  Cleatrice Burke, time 2409 Date 12/13/2020             Cosigned by: Courtney Heys, MD at 12/13/2020 10:17 AM    Revision History                             Note Details  Author Cristina Gong, RN File Time 12/13/2020 10:15 AM  Author Type Rehab Admission Coordinator Status Signed  Last Editor Cristina Gong, RN Service Physical Medicine and [place] # [id number] Admit Date 12/13/2020

## 2020-12-14 DIAGNOSIS — I639 Cerebral infarction, unspecified: Secondary | ICD-10-CM | POA: Diagnosis not present

## 2020-12-14 LAB — COMPREHENSIVE METABOLIC PANEL
ALT: 24 U/L (ref 0–44)
AST: 22 U/L (ref 15–41)
Albumin: 3.8 g/dL (ref 3.5–5.0)
Alkaline Phosphatase: 80 U/L (ref 38–126)
Anion gap: 10 (ref 5–15)
BUN: 12 mg/dL (ref 8–23)
CO2: 26 mmol/L (ref 22–32)
Calcium: 9.9 mg/dL (ref 8.9–10.3)
Chloride: 105 mmol/L (ref 98–111)
Creatinine, Ser: 0.75 mg/dL (ref 0.44–1.00)
GFR, Estimated: >60 mL/min (ref >60)
Glucose, Bld: 129 mg/dL — ABNORMAL HIGH (ref 70–99)
Potassium: 4.2 mmol/L (ref 3.5–5.1)
Sodium: 141 mmol/L (ref 135–145)
Total Bilirubin: 0.7 mg/dL (ref 0.3–1.2)
Total Protein: 6.9 g/dL (ref 6.5–8.1)

## 2020-12-14 LAB — CBC WITH DIFFERENTIAL/PLATELET
Abs Immature Granulocytes: 0.03 10*3/uL (ref 0.00–0.07)
Basophils Absolute: 0 10*3/uL (ref 0.0–0.1)
Basophils Relative: 0 %
Eosinophils Absolute: 0.3 10*3/uL (ref 0.0–0.5)
Eosinophils Relative: 3 %
HCT: 43.6 % (ref 36.0–46.0)
Hemoglobin: 14 g/dL (ref 12.0–15.0)
Immature Granulocytes: 0 %
Lymphocytes Relative: 35 %
Lymphs Abs: 3.2 10*3/uL (ref 0.7–4.0)
MCH: 27.7 pg (ref 26.0–34.0)
MCHC: 32.1 g/dL (ref 30.0–36.0)
MCV: 86.3 fL (ref 80.0–100.0)
Monocytes Absolute: 0.5 10*3/uL (ref 0.1–1.0)
Monocytes Relative: 6 %
Neutro Abs: 5 10*3/uL (ref 1.7–7.7)
Neutrophils Relative %: 56 %
Platelets: 330 10*3/uL (ref 150–400)
RBC: 5.05 MIL/uL (ref 3.87–5.11)
RDW: 14.7 % (ref 11.5–15.5)
WBC: 9 10*3/uL (ref 4.0–10.5)
nRBC: 0 % (ref 0.0–0.2)

## 2020-12-14 NOTE — Progress Notes (Signed)
Orthopedic Tech Progress Note Patient Details:  Carly [name] 21-Jul-1951 [id number] Called in order to [facility] for an AFO Patient ID: Carly [name], female   DOB: Feb 14, 1951, 70 y.o.   MRN: [id number]   Janit Pagan 12/14/2020, 8:58 AM

## 2020-12-14 NOTE — Evaluation (Signed)
Occupational Therapy Assessment and Plan  Patient Details  Name: Carly [name] MRN: [id number] Date of Birth: 09/24/1951  OT Diagnosis: abnormal posture, hemiplegia affecting dominant side, muscular wasting and disuse atrophy and muscle weakness (generalized) Rehab Potential: Rehab Potential (ACUTE ONLY): Good ELOS: 18-21 days   Today's Date: 12/14/2020 OT Individual Time: 3664-4034 OT Individual Time Calculation (min): 60 min     Hospital Problem: Principal Problem:   White matter periventricular infarction [facility])   Past Medical History:  Past Medical History:  Diagnosis Date  . Allergy   . Anxiety   . Aspiration pneumonia ([place])   . Depression   . Frequent headaches    H/O  . GERD (gastroesophageal reflux disease)   . History of chicken pox   . History of colon polyps   . Hx of migraines   . Multiple sclerosis ([place])   . PONV (postoperative nausea and vomiting)    Past Surgical History:  Past Surgical History:  Procedure Laterality Date  . BACK SURGERY    . CHOLECYSTECTOMY    . COLONOSCOPY WITH PROPOFOL N/A 05/18/2017   Procedure: COLONOSCOPY WITH PROPOFOL;  Surgeon: [name], MD;  Location: [facility] ENDOSCOPY;  Service: Endoscopy;  Laterality: N/A;  . FOOT SURGERY  2015  . GALLBLADDER SURGERY  2008  . HARDWARE REMOVAL Left 02/14/2016   Procedure: LEFT FOOT REMOVAL DEEP IMPLANT;  Surgeon: [name], MD;  Location: [place];  Service: Orthopedics;  Laterality: Left;  . HERNIA REPAIR     Inguinal Hernia Repair  . SPINE SURGERY  2014    Assessment & Plan Clinical Impression: Patient is a 70 y.o. year old female with history of MS with LLE weakness--on Ampyra for gait disorder, GERD, anxiety/depression, HA who was admitted on 12/08/20 with one day history of weakness with difficulty standing, dizziness, mental status changes and difficulty picking items with right hand. MRI brain done revealing acute white matter infarct in deep white matter left  corona radiata and extensive abnormal T2/Flair signal unchanged c/w 2017. MRA brain negative for stenosis or LVO but showed diffuse atherosclerotic irregularity --most severe in PCA branches.  2D echo showed EF 60-65% with no wall abnormalities.  Carotid dopplers negative for ICA stenosis. Neurology felt that stroke was due to small vessel disease and Plavix added. Psychiatry consulted for assistance with management of acute on chronic GAD with bouts of lability and recommended increasing mirtazapine to 45 mg/hs as well as follow up with therapist for counseling after d/c.    Patient with chronic left sided weakness and now with right sided weakness affecting balance, has right lateral lean with left knee bucking, needs increased time to processing as well as intermittent problems with recall. Therapy ongoing and CIR recommended due to functional decline. .  Patient transferred to CIR on 12/13/2020 .    Patient currently requires total with basic self-care skills secondary to muscle weakness, decreased cardiorespiratoy endurance, impaired timing and sequencing, unbalanced muscle activation and decreased coordination, decreased attention to right and decreased motor planning, decreased awareness, decreased problem solving and decreased memory and decreased sitting balance, decreased standing balance, decreased postural control, hemiplegia and decreased balance strategies.  Prior to hospitalization, patient could complete ADLs with assistance from one caregiver (unsure exact amount of assist pt required).  Patient will benefit from skilled intervention to decrease level of assist with basic self-care skills prior to discharge home with care partner.  Anticipate patient will require 24 hour supervision and minimal physical assistance and follow  up home health.  OT - End of Session Activity Tolerance: Tolerates 30+ min activity with multiple rests Endurance Deficit: Yes Endurance Deficit Description: requires  frequent rest breaks, increased balance deficits as pt fatigued OT Assessment Rehab Potential (ACUTE ONLY): Good OT Barriers to Discharge: Decreased caregiver support;Home environment access/layout OT Patient demonstrates impairments in the following area(s): Balance;Cognition;Endurance;Motor;Perception;Safety OT Basic ADL's Functional Problem(s): Grooming;Eating;Bathing;Dressing;Toileting OT Transfers Functional Problem(s): Toilet;Tub/Shower OT Additional Impairment(s): Fuctional Use of Upper Extremity OT Plan OT Intensity: Minimum of 1-2 x/day, 45 to 90 minutes OT Frequency: 5 out of 7 days OT Duration/Estimated Length of Stay: 18-21 days OT Treatment/Interventions: Balance/vestibular training;Cognitive remediation/compensation;Community reintegration;Discharge planning;Disease mangement/prevention;DME/adaptive equipment instruction;Functional electrical stimulation;Functional mobility training;Neuromuscular re-education;Pain management;Patient/family education;Psychosocial support;Self Care/advanced ADL retraining;Therapeutic Activities;Therapeutic Exercise;UE/LE Strength taining/ROM;UE/LE Coordination activities OT Self Feeding Anticipated Outcome(s): Supervision/setup OT Basic Self-Care Anticipated Outcome(s): Min assist OT Toileting Anticipated Outcome(s): Min assist OT Bathroom Transfers Anticipated Outcome(s): Min assist OT Recommendation Recommendations for Other Services: Neuropsych consult;Therapeutic Recreation consult Therapeutic Recreation Interventions:  (coping/anxiety) Patient destination: Home Follow Up Recommendations: Home health OT;24 hour supervision/assistance Equipment Recommended: To be determined   OT Evaluation Precautions/Restrictions  Precautions Precautions: Fall Precaution Comments: R hemi, has MS with LUE and LLE weakness General   Vital Signs Therapy Vitals Temp: 98.6 F (37 C) Temp Source: Oral Pulse Rate: 99 Resp: 17 BP: (!) 152/74 Patient  Position (if appropriate): Lying Oxygen Therapy SpO2: 96 % O2 Device: Room Air Pain Pain Assessment Pain Scale: 0-10 Pain Score: 0-No pain Home Living/Prior Functioning Home Living Available Help at Discharge: Family,Available 24 hours/day,Personal care attendant,Available PRN/intermittently (caregiver MWF 8 hr days) Type of Home: House Home Access: Stairs to enter [employer] of Steps: 4 (pt inconsistent with reports of number of stairs) Entrance Stairs-Rails: Right Home Layout: Multi-level,Able to live on main level with bedroom/bathroom Bathroom Shower/Tub: Walk-in shower (shower chair and grab bars) Bathroom Toilet: Standard Bathroom Accessibility: Yes Additional Comments: pt reports conflict with prior acute chart reports (will need to clarify with family)  Lives With: Spouse IADL History Homemaking Responsibilities: No Current License: No Prior Function Level of Independence: Requires assistive device for independence,Needs assistance with ADLs  Able to Take Stairs?: Yes Driving: No Comments: Pt has hx of MS & presents with rigid L knee (difficult to flex L knee even with PROM) & reports she has a L AFO to use 2/2 foot drop. Vision Baseline Vision/History: Wears glasses;Cataracts Wears Glasses: At all times Patient Visual Report: No change from baseline Vision Assessment?: Yes Eye Alignment: Within Functional Limits Ocular Range of Motion: Within Functional Limits Tracking/Visual Pursuits: Able to track stimulus in all quads without difficulty Perception  Perception: Within Functional Limits Praxis Praxis: Impaired Praxis Impairment Details: Ideation;Motor planning Cognition Overall Cognitive Status: Within Functional Limits for tasks assessed Arousal/Alertness: Awake/alert Orientation Level: Person;Place;Situation Person: Oriented Place: Oriented Situation: Oriented Year: 2022 Month: March Day of Week: Correct Memory: Appears intact Immediate  Memory Recall: Sock;Blue;Bed Memory Recall Sock: Not able to recall Memory Recall Blue: Without Cue Memory Recall Bed: Without Cue Attention: Sustained Sustained Attention: Appears intact Awareness: Impaired Problem Solving: Impaired Behaviors: Impulsive;Lability Safety/Judgment: Impaired Sensation Sensation Light Touch: Appears Intact Proprioception: Appears Intact Coordination Gross Motor Movements are Fluid and Coordinated: No Fine Motor Movements are Fluid and Coordinated: No Finger Nose Finger Test: limited on RUE, able to touch nose with effort Motor     Trunk/Postural Assessment     Balance Balance Balance Assessed: Yes Static Sitting Balance Static Sitting - Balance Support: Right upper extremity  supported;Feet supported Static Sitting - Level of Assistance: 4: Min assist Dynamic Sitting Balance Dynamic Sitting - Balance Support: During functional activity Dynamic Sitting - Level of Assistance: 3: Mod assist Dynamic Sitting Balance - Compensations: noted R lateral lean and frequent posterior lean during self-care tasks seated EOB Extremity/Trunk Assessment RUE Assessment RUE Assessment: Exceptions to WFL Passive Range of Motion (PROM) Comments: WNL Active Range of Motion (AROM) Comments: grossly 110* shoulder flexion, elbow WNL General Strength Comments: Slight inattention to RUE during functional tasks. Strength: 3/5 grip, 4-/5 elbow flexion/extension, and at least 3/5 shoulder flexion LUE Assessment LUE Assessment: Within Functional Limits Active Range of Motion (AROM) Comments: WNL General Strength Comments: grossly 4/5  Care Tool Care Tool Self Care Eating        Oral Care         Bathing   Body parts bathed by patient: Right arm;Chest;Abdomen;Face Body parts bathed by helper: Left arm;Front perineal area;Buttocks;Right upper leg;Left upper leg;Right lower leg;Left lower leg   Assist Level: Maximal Assistance - Patient 24 - 49%    Upper Body  Dressing(including orthotics)   What is the patient wearing?: Pull over shirt   Assist Level: Moderate Assistance - Patient 50 - 74%    Lower Body Dressing (excluding footwear)   What is the patient wearing?: Pants Assist for lower body dressing: Dependent - Patient 0% (sit > stand in Stedy)    Putting on/Taking off footwear   What is the patient wearing?: Non-skid slipper socks Assist for footwear: Total Assistance - Patient < 25%       Care Tool Toileting Toileting activity         Care Tool Bed Mobility Roll left and right activity   Roll left and right assist level: Moderate Assistance - Patient 50 - 74%    Sit to lying activity        Lying to sitting edge of bed activity   Lying to sitting edge of bed assist level: Moderate Assistance - Patient 50 - 74%     Care Tool Transfers Sit to stand transfer   Sit to stand assist level: Dependent - Patient 0%    Chair/bed transfer   Chair/bed transfer assist level: Dependent - mechanical lift     Toilet transfer   Assist Level: Dependent - Patient 0% (Stedy)     Care Tool Cognition Expression of Ideas and Wants Expression of Ideas and Wants: Some difficulty - exhibits some difficulty with expressing needs and ideas (e.g, some words or finishing thoughts) or speech is not clear   Understanding Verbal and Non-Verbal Content Understanding Verbal and Non-Verbal Content: Usually understands - understands most conversations, but misses some part/intent of message. Requires cues at times to understand   Memory/Recall Ability *first 3 days only Memory/Recall Ability *first 3 days only: Current season;That he or she is in a hospital/hospital unit;Staff names and faces    Refer to Care Plan for Long Term Goals  SHORT TERM GOAL WEEK 1 OT Short Term Goal 1 (Week 1): Pt will complete sit > stand with max assist of one caregiver to progress towards standing for LB self-care tasks OT Short Term Goal 2 (Week 1): Pt will complete LB  dressing with max assist of one caregiver OT Short Term Goal 3 (Week 1): Pt will complete toilet transfer with mod assist stand vs squat pivot OT Short Term Goal 4 (Week 1): Pt will complete bathing with mod assist  Recommendations for other services: Neuropsych and Therapeutic Recreation    Other coping/anxiety/leisure pursuits   Skilled Therapeutic Intervention OT eval completed with discussion of rehab process, OT purpose, POC, ELOS, and goals.  ADL assessment completed at EOB with focus on dynamic sitting balance and functional use of dynamic RUE.  Pt received supine in bed reporting sensation of needing to toilet, however just having attempted with nurse tech.  Pt agreeable to engaging in bathing and dressing.  Pt completed bed mobility with mod assist to roll and for sidelying to sitting.  Pt completed UB bathing with attempts to incorporate RUE, however demonstrating decreased motor control and weakness. Attempted sit > stand from EOB with pt unable to complete full upright stand due to heavy pulling on RW and lean to R.  Therapist introduced Stedy with pt able to complete sit > stand with mod assist fading to min assist throughout session.  Completed transfers and clothing management in Stedy.  Pt demonstrating increased R and posterior lean as she fatigued during session.  Pt returned to supine and left semi-reclined in bed with all needs in reach.  ADL ADL Upper Body Bathing: Minimal assistance Where Assessed-Upper Body Bathing: Edge of bed Lower Body Bathing: Maximal assistance Where Assessed-Lower Body Bathing: Edge of bed Upper Body Dressing: Moderate assistance Where Assessed-Upper Body Dressing: Edge of bed Lower Body Dressing: Dependent Where Assessed-Lower Body Dressing: Edge of bed Toileting: Dependent (Mod A sit > stand with Stedy) Where Assessed-Toileting: Bedside Commode Toilet Transfer: Dependent (Stedy) Toilet Transfer Method:  (Stedy) Toilet Transfer Equipment: Bedside  commode Mobility  Bed Mobility Bed Mobility: Rolling Right;Right Sidelying to Sit;Scooting to HOB Rolling Right: Minimal Assistance - Patient > 75% Right Sidelying to Sit: Moderate Assistance - Patient 50-74% Scooting to HOB: Moderate Assistance - Patient 50-74% Transfers Sit to Stand: Dependent - mechanical lift Stand to Sit: Moderate Assistance - Patient 50-74%   Discharge Criteria: Patient will be discharged from OT if patient refuses treatment 3 consecutive times without medical reason, if treatment goals not met, if there is a change in medical status, if patient makes no progress towards goals or if patient is discharged from hospital.  The above assessment, treatment plan, treatment alternatives and goals were discussed and mutually agreed upon: by patient  ,  12/14/2020, 3:38 PM  

## 2020-12-14 NOTE — Plan of Care (Signed)
  Problem: RH Balance Goal: LTG Patient will maintain dynamic sitting balance (PT) Description: LTG:  Patient will maintain dynamic sitting balance with assistance during mobility activities (PT) Flowsheets (Taken 12/14/2020 1637) LTG: Pt will maintain dynamic sitting balance during mobility activities with:: Independent with assistive device  Goal: LTG Patient will maintain dynamic standing balance (PT) Description: LTG:  Patient will maintain dynamic standing balance with assistance during mobility activities (PT) Flowsheets (Taken 12/14/2020 1637) LTG: Pt will maintain dynamic standing balance during mobility activities with:: Supervision/Verbal cueing   Problem: RH Bed Mobility Goal: LTG Patient will perform bed mobility with assist (PT) Description: LTG: Patient will perform bed mobility with assistance, with/without cues (PT). Flowsheets (Taken 12/14/2020 1637) LTG: Pt will perform bed mobility with assistance level of: Minimal Assistance - Patient > 75%   Problem: RH Bed to Chair Transfers Goal: LTG Patient will perform bed/chair transfers w/assist (PT) Description: LTG: Patient will perform bed to chair transfers with assistance (PT). Flowsheets (Taken 12/14/2020 1637) LTG: Pt will perform Bed to Chair Transfers with assistance level: Minimal Assistance - Patient > 75%   Problem: RH Car Transfers Goal: LTG Patient will perform car transfers with assist (PT) Description: LTG: Patient will perform car transfers with assistance (PT). Flowsheets (Taken 12/14/2020 1637) LTG: Pt will perform car transfers with assist:: Minimal Assistance - Patient > 75%   Problem: RH Furniture Transfers Goal: LTG Patient will perform furniture transfers w/assist (OT/PT) Description: LTG: Patient will perform furniture transfers  with assistance (OT/PT). Flowsheets (Taken 12/14/2020 1637) LTG: Pt will perform furniture transfers with assist:: Minimal Assistance - Patient > 75%   Problem: RH Ambulation Goal:  LTG Patient will ambulate in controlled environment (PT) Description: LTG: Patient will ambulate in a controlled environment, # of feet with assistance (PT). Flowsheets (Taken 12/14/2020 1637) LTG: Pt will ambulate in controlled environ  assist needed:: Minimal Assistance - Patient > 75% LTG: Ambulation distance in controlled environment: 10ft with LRAD Goal: LTG Patient will ambulate in home environment (PT) Description: LTG: Patient will ambulate in home environment, # of feet with assistance (PT). Flowsheets (Taken 12/14/2020 1637) LTG: Pt will ambulate in home environ  assist needed:: Minimal Assistance - Patient > 75% LTG: Ambulation distance in home environment: 85ft with LRAD   Problem: RH Wheelchair Mobility Goal: LTG Patient will propel w/c in controlled environment (PT) Description: LTG: Patient will propel wheelchair in controlled environment, # of feet with assist (PT) Flowsheets (Taken 12/14/2020 1637) LTG: Pt will propel w/c in controlled environ  assist needed:: Supervision/Verbal cueing LTG: Propel w/c distance in controlled environment: 170ft Goal: LTG Patient will propel w/c in home environment (PT) Description: LTG: Patient will propel wheelchair in home environment, # of feet with assistance (PT). Flowsheets (Taken 12/14/2020 1637) Distance: wheelchair distance in controlled environment: 50   Problem: RH Stairs Goal: LTG Patient will ambulate up and down stairs w/assist (PT) Description: LTG: Patient will ambulate up and down # of stairs with assistance (PT) Flowsheets (Taken 12/14/2020 1637) LTG: Pt will ambulate up/down stairs assist needed:: Minimal Assistance - Patient > 75% LTG: Pt will  ambulate up and down number of stairs: 4 steps with 1 rail on the R

## 2020-12-14 NOTE — Evaluation (Signed)
Physical Therapy Assessment and Plan  Patient Details  Name: Carly [name] MRN: [id number] Date of Birth: Dec 21, 1950  PT Diagnosis: Abnormal posture, Abnormality of gait, Coordination disorder, Hemiplegia dominant, Impaired cognition and Muscle weakness Rehab Potential: Good ELOS: 18-21 days   Today's Date: 12/14/2020 PT Individual Time: 1530-1640 PT Individual Time Calculation (min): 70 min    Hospital Problem: Principal Problem:   White matter periventricular infarction [facility])   Past Medical History:  Past Medical History:  Diagnosis Date  . Allergy   . Anxiety   . Aspiration pneumonia ([place])   . Depression   . Frequent headaches    H/O  . GERD (gastroesophageal reflux disease)   . History of chicken pox   . History of colon polyps   . Hx of migraines   . Multiple sclerosis ([place])   . PONV (postoperative nausea and vomiting)    Past Surgical History:  Past Surgical History:  Procedure Laterality Date  . BACK SURGERY    . CHOLECYSTECTOMY    . COLONOSCOPY WITH PROPOFOL N/A 05/18/2017   Procedure: COLONOSCOPY WITH PROPOFOL;  Surgeon: [name], MD;  Location: [facility] ENDOSCOPY;  Service: Endoscopy;  Laterality: N/A;  . FOOT SURGERY  2015  . GALLBLADDER SURGERY  2008  . HARDWARE REMOVAL Left 02/14/2016   Procedure: LEFT FOOT REMOVAL DEEP IMPLANT;  Surgeon: [name], MD;  Location: [place];  Service: Orthopedics;  Laterality: Left;  . HERNIA REPAIR     Inguinal Hernia Repair  . SPINE SURGERY  2014    Assessment & Plan Clinical Impression: Patient is a 69 year old female with history of MS with LLE weakness--on Ampyra for gait disorder, GERD, anxiety/depression, HA who was admitted on 12/08/20 with one day history of weakness with difficulty standing, dizziness, mental status changes and difficulty picking items with right hand. MRI brain done revealing acute white matter infarct in deep white matter left corona radiata and extensive abnormal  T2/Flair signal unchanged c/w 2017. MRA brain negative for stenosis or LVO but showed diffuse atherosclerotic irregularity --most severe in PCA branches.  2D echo showed EF 60-65% with no wall abnormalities.  Carotid dopplers negative for ICA stenosis. Neurology felt that stroke was due to small vessel disease and Plavix added  Patient transferred to CIR on 12/13/2020 .   Patient currently requires max with mobility secondary to muscle weakness and muscle joint tightness, decreased cardiorespiratoy endurance, impaired timing and sequencing, abnormal tone, motor apraxia, decreased coordination and decreased motor planning, decreased attention, decreased awareness, decreased problem solving, decreased safety awareness, decreased memory and delayed processing and decreased sitting balance, decreased standing balance, decreased postural control, hemiplegia and decreased balance strategies.  Prior to hospitalization, patient was supervision with mobility and lived with Spouse in a House home.  Home access is 4 (pt inconsistent with reports of number of stairs)Stairs to enter.  Patient will benefit from skilled PT intervention to maximize safe functional mobility, minimize fall risk and decrease caregiver burden for planned discharge home with 24 hour assist.  Anticipate patient will benefit from follow up [facility] at discharge.  PT - End of Session Activity Tolerance: Tolerates 10 - 20 min activity with multiple rests Endurance Deficit: Yes Endurance Deficit Description: requires frequent rest breaks, increased balance deficits as pt fatigued PT Assessment Rehab Potential (ACUTE/IP ONLY): Good PT Barriers to Discharge: Decreased caregiver support;Home environment access/layout;Behavior PT Patient demonstrates impairments in the following area(s): Balance;Behavior;Edema;Endurance;Motor;Perception;Safety;Skin Integrity PT Transfers Functional Problem(s): Bed Mobility;Bed to Chair;Car;Furniture PT Locomotion  Functional Problem(s): Ambulation;Wheelchair Mobility;Stairs PT Plan PT Intensity: Minimum of 1-2 x/day ,45 to 90 minutes PT Frequency: 5 out of 7 days PT Duration Estimated Length of Stay: 18-21 days PT Treatment/Interventions: Ambulation/gait training;Balance/vestibular training;Cognitive remediation/compensation;Disease management/prevention;Discharge planning;Community reintegration;DME/adaptive equipment instruction;Functional electrical stimulation;Functional mobility training;Patient/family education;Psychosocial support;Neuromuscular re-education;Skin care/wound management;Pain management;Splinting/orthotics;Stair training;UE/LE Strength taining/ROM;UE/LE Coordination activities;Therapeutic Activities;Therapeutic Exercise;Visual/perceptual remediation/compensation;Wheelchair propulsion/positioning PT Transfers Anticipated Outcome(s): Min assist with LRAD PT Locomotion Anticipated Outcome(s): Min assist with LRAD at house hold ambulatory level PT Recommendation Recommendations for Other Services: Therapeutic Recreation consult;Neuropsych consult Therapeutic Recreation Interventions: Stress management;Outing/community reintergration Follow Up Recommendations: Home health PT Patient destination: Home Equipment Recommended: To be determined   PT Evaluation Precautions/Restrictions Precautions Precaution Comments: R hemi, has MS with rigid LLE Restrictions Weight Bearing Restrictions: No General   Vital SignsTherapy Vitals Temp: 98.6 F (37 C) Temp Source: Oral Pulse Rate: 99 Resp: 17 BP: (!) 152/74 Patient Position (if appropriate): Lying Oxygen Therapy SpO2: 96 % O2 Device: Room Air Pain Pain Assessment Pain Score: 3  Home Living/Prior Functioning Home Living Available Help at Discharge: Family;Available 24 hours/day;Personal care attendant;Available PRN/intermittently (caregiver MWF 8 hr days) Type of Home: House Home Access: Stairs to enter [employer] of  Steps: 4 (pt inconsistent with reports of number of stairs) Entrance Stairs-Rails: Right Home Layout: Multi-level;Able to live on main level with bedroom/bathroom Bathroom Shower/Tub: Walk-in shower (shower chair and grab bars) Bathroom Toilet: Standard Bathroom Accessibility: Yes Additional Comments: pt reports conflict with prior acute chart reports (will need to clarify with family)  Lives With: Spouse Prior Function Level of Independence: Requires assistive device for independence;Needs assistance with ADLs  Able to Take Stairs?: Yes Driving: No Comments: Pt has hx of MS & presents with rigid L knee (difficult to flex L knee even with PROM) & reports she has a L AFO to use 2/2 foot drop. 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 mild apraxia initially on the RLE Motor  Motor Motor: Motor apraxia;Abnormal postural alignment and control;Motor impersistence Motor - Skilled Clinical Observations: apraxia on the R UE/LE. motor impersistance. rigidity on the LLE from MS   Trunk/Postural Assessment  Cervical Assessment Cervical Assessment: Exceptions to [facility] (forward head) Thoracic Assessment Thoracic Assessment: Exceptions to [facility] (rounded shoulders and scoliosis) Lumbar Assessment Lumbar Assessment: Exceptions to [facility] (decreased lordosis) Postural Control Postural Control: Deficits on evaluation (R and posterior bias)  Balance Balance Balance Assessed: Yes Static Sitting Balance Static Sitting - Balance Support: Right upper extremity supported;Feet supported Static Sitting - Level of Assistance: 4: Min assist Dynamic Sitting Balance Dynamic Sitting  - Balance Support: During functional activity Dynamic Sitting - Level of Assistance: 4: Min assist Dynamic Sitting Balance - Compensations: noted R lateral lean and frequent posterior lean during self-care tasks seated EOB Static Standing Balance Static Standing - Balance Support: Bilateral upper extremity supported;During functional activity Static Standing - Level of Assistance: 4: Min assist;3: Mod assist Dynamic Standing Balance Dynamic Standing - Balance Support: Bilateral upper extremity supported Dynamic Standing - Level of Assistance: 3: Mod assist Dynamic Standing - Comments: with gait and transfers. Extremity Assessment  RUE Assessment RUE Assessment: Exceptions to [facility] Passive Range of Motion (PROM) Comments: WNL Active Range of Motion (  AROM) Comments: grossly 110* shoulder flexion, elbow WNL General Strength Comments: Slight inattention to RUE during functional tasks. Strength: 3/5 grip, 4-/5 elbow flexion/extension, and at least 3/5 shoulder flexion LUE Assessment LUE Assessment: Within Functional Limits Active Range of Motion (AROM) Comments: WNL General Strength Comments: grossly 4/5 RLE Assessment RLE Assessment: Within Functional Limits General Strength Comments: grossly 4+/5 proximal to distal LLE Assessment LLE Assessment: Exceptions to [facility] General Strength Comments: grossly 4+/5 with extensor response with fatigue  Care Tool Care Tool Bed Mobility Roll left and right activity   Roll left and right assist level: Moderate Assistance - Patient 50 - 74%    Sit to lying activity   Sit to lying assist level: Moderate Assistance - Patient 50 - 74%    Lying to sitting edge of bed activity   Lying to sitting edge of bed assist level: Moderate Assistance - Patient 50 - 74%     Care Tool Transfers Sit to stand transfer   Sit to stand assist level: Maximal Assistance - Patient 25 - 49%    Chair/bed transfer   Chair/bed transfer assist level: Maximal Assistance -  Patient 25 - 49%     Toilet transfer   Assist Level: Dependent - Patient 0% (Stedy)    Geneticist, molecular transfer assist level: Maximal Assistance - Patient 25 - 49%      Care Tool Locomotion Ambulation Ambulation activity did not occur: Safety/medical concerns        Walk 10 feet activity Walk 10 feet activity did not occur: Safety/medical concerns       Walk 50 feet with 2 turns activity Walk 50 feet with 2 turns activity did not occur: Safety/medical concerns      Walk 150 feet activity Walk 150 feet activity did not occur: Safety/medical concerns      Walk 10 feet on uneven surfaces activity Walk 10 feet on uneven surfaces activity did not occur: Safety/medical concerns      Stairs Stair activity did not occur: Safety/medical concerns        Walk up/down 1 step activity Walk up/down 1 step or curb (drop down) activity did not occur: Safety/medical concerns     Walk up/down 4 steps activity did not occuR: Safety/medical concerns  Walk up/down 4 steps activity      Walk up/down 12 steps activity Walk up/down 12 steps activity did not occur: Safety/medical concerns      Pick up small objects from floor Pick up small object from the floor (from standing position) activity did not occur: Safety/medical concerns      Wheelchair   Type of Wheelchair: Manual   Wheelchair assist level: Minimal Assistance - Patient > 75% Max wheelchair distance: 150  Wheel 50 feet with 2 turns activity   Assist Level: Minimal Assistance - Patient > 75%  Wheel 150 feet activity   Assist Level: Minimal Assistance - Patient > 75%    Refer to Care Plan for Long Term Goals  SHORT TERM GOAL WEEK 1 PT Short Term Goal 1 (Week 1): Pt will perform bed mobility with min assist ang use of bed features as needed PT Short Term Goal 2 (Week 1): Pt will performed stand pivot transfer to [facility] with mod assist consistently PT Short Term Goal 3 (Week 1): Pt wil ambulate 20f with mod assist and  LRAD PT Short Term Goal 4 (Week 1): Pt will propell WC 1588fwith supervision assist  Recommendations for other services: Neuropsych and Therapeutic  Recreation  Stress management and Outing/community reintegration  Skilled Therapeutic Intervention  Pt received sitting in recliner and agreeable to PT. PT instructed patient in PT Evaluation and initiated treatment intervention; see above for results. PT educated patient in [place], rehab potential, rehab goals, and discharge recommendations along with recommendation for follow-up rehabilitation services. Gait training with mod assist overall from PT with max cues for motor planning and posture. WC mobility with min assist as listed below. Car transfer training with max assist for safety and poor motor planning. Patient returned to room and performed stedy transfer to toilet. Pt able to void bladder. Stedy transfer to recilner with min assist.   Pt left sitting in recliner with call bell in reach and all needs met.      Mobility Bed Mobility Bed Mobility: Rolling Right;Right Sidelying to Sit;Scooting to [facility] Rolling Right: Minimal Assistance - Patient > 75% Right Sidelying to Sit: Moderate Assistance - Patient 50-74% Scooting to HOB: Moderate Assistance - Patient 50-74% Transfers Transfers: Sit to Stand;Stand to Sit;Stand Pivot Transfers;Transfer via Lift Equipment Sit to Stand: Moderate Assistance - Patient 50-74% (RW) Stand to Sit: Moderate Assistance - Patient 50-74% Stand Pivot Transfers: Maximal Assistance - Patient 25 - 49%;Moderate Assistance - Patient 50 - 74% Stand Pivot Transfer Details: Verbal cues for sequencing;Verbal cues for technique;Verbal cues for precautions/safety;Verbal cues for gait pattern;Verbal cues for safe use of DME/AE Transfer (Assistive device): Rolling walker Transfer via Lift Equipment: Probation officer Ambulation: Yes Gait Assistance: Moderate Assistance - Patient 50-74% Gait Distance (Feet): 30  Feet Assistive device: Rolling walker Gait Assistance Details: Manual facilitation for weight shifting;Verbal cues for safe use of DME/AE;Verbal cues for gait pattern;Verbal cues for precautions/safety;Verbal cues for technique;Verbal cues for sequencing Gait Gait: Yes Gait Pattern: Impaired Gait Pattern: Lateral trunk lean to right;Trunk flexed;Shuffle;Poor foot clearance - right;Poor foot clearance - left (apraxia) Stairs / Additional Locomotion Stairs: No Architect: Yes Wheelchair Assistance: Minimal assistance - Patient >75% Wheelchair Propulsion: Both upper extremities Wheelchair Parts Management: Needs assistance Distance: 150   Discharge Criteria: Patient will be discharged from PT if patient refuses treatment 3 consecutive times without medical reason, if treatment goals not met, if there is a change in medical status, if patient makes no progress towards goals or if patient is discharged from hospital.  The above assessment, treatment plan, treatment alternatives and goals were discussed and mutually agreed upon: by patient  Lorie Phenix 12/14/2020, 4:50 PM

## 2020-12-14 NOTE — Progress Notes (Signed)
Occupational Therapy Session Note  Patient Details  Name: Carly [name] MRN: [id number] Date of Birth: 1951/08/19  Today's Date: 12/14/2020 OT Individual Time: 9038-3338 OT Individual Time Calculation (min): 33 min    Short Term Goals: Week 1:  OT Short Term Goal 1 (Week 1): Pt will complete sit > stand with max assist of one caregiver to progress towards standing for LB self-care tasks OT Short Term Goal 2 (Week 1): Pt will complete LB dressing with max assist of one caregiver OT Short Term Goal 3 (Week 1): Pt will complete toilet transfer with mod assist stand vs squat pivot OT Short Term Goal 4 (Week 1): Pt will complete bathing with mod assist  Skilled Therapeutic Interventions/Progress Updates:  Treatment session with focus on functional use of RUE during self-care tasks.  Pt received upright in recliner expressing desire to wash her hair.  Completed transfer recliner > w/c via Stedy with pt able to stand with mod assist from low recliner.  Therapist set pt up at sink with hair washing tray and washed pt's hair total assist.  Pt then utilized RUE as gross assist with LUE over R hand to brush hair while therapist dried hair.  Pt demonstrating improved ability to reach to front and top of head, unable to reach back of head with RUE.  Pt returned to recliner Min assist to stand in [place] and left with BLE elevated, chair alarm on, and all needs in reach.  Therapy Documentation Precautions:  Precautions Precautions: Fall Precaution Comments: R hemi, has MS with rigid LLE Restrictions Weight Bearing Restrictions: No General:   Vital Signs: Therapy Vitals Temp: 98.6 F (37 C) Temp Source: Oral Pulse Rate: 99 Resp: 17 BP: (!) 152/74 Patient Position (if appropriate): Lying Oxygen Therapy SpO2: 96 % O2 Device: Room Air Pain:  Pt with no c/o pain   Therapy/Group: Individual Therapy  Simonne Come 12/14/2020, 3:46 PM

## 2020-12-14 NOTE — Progress Notes (Signed)
Inpatient Rehabilitation Care Coordinator Assessment and Plan Patient Details  Name: Carly [name] MRN: [id number] Date of Birth: [birth year]  Today's Date: 12/14/2020  Hospital Problems: Principal Problem:   White matter periventricular infarction [facility])  Past Medical History:  Past Medical History:  Diagnosis Date  . Allergy   . Anxiety   . Aspiration pneumonia ([place])   . Depression   . Frequent headaches    H/O  . GERD (gastroesophageal reflux disease)   . History of chicken pox   . History of colon polyps   . Hx of migraines   . Multiple sclerosis ([place])   . PONV (postoperative nausea and vomiting)    Past Surgical History:  Past Surgical History:  Procedure Laterality Date  . BACK SURGERY    . CHOLECYSTECTOMY    . COLONOSCOPY WITH PROPOFOL N/A 05/18/2017   Procedure: COLONOSCOPY WITH PROPOFOL;  Surgeon: [name], MD;  Location: [facility] ENDOSCOPY;  Service: Endoscopy;  Laterality: N/A;  . FOOT SURGERY  2015  . GALLBLADDER SURGERY  2008  . HARDWARE REMOVAL Left 02/14/2016   Procedure: LEFT FOOT REMOVAL DEEP IMPLANT;  Surgeon: [name], MD;  Location: [place];  Service: Orthopedics;  Laterality: Left;  . HERNIA REPAIR     Inguinal Hernia Repair  . SPINE SURGERY  2014   Social History:  reports that she has never smoked. She has never used smokeless tobacco. She reports that she does not drink alcohol and does not use drugs.  Family / Support Systems Marital Status: Married Patient Roles: Spouse,Parent Spouse/Significant Other: Bill [phone]  [phone] Children: Three son's one in [place], [place] and [place] Other Supports: Michelle-paid caregiver Anticipated Caregiver: husband and Michelle-hired caregiver Ability/Limitations of Caregiver: Supervision-min assist Caregiver Availability: 24/7 Family Dynamics: Close knit with all three son's but all live a distance away. Husband has golf buddies and he gets out. Pt voiced she has  been closed since diagnosis of MS, but is realizing her need to open up and be vocal and involved in the conversation of future plans with husband.  Social History Preferred language: English Religion: Patient Refused Cultural Background: No issues Education: Some college Read: Yes Write: Yes Employment Status: Retired Public relations account executive Issues: No issues Guardian/Conservator: None-according to MD pt is not fully capable of making her own decisions while here. She seems to be oriented and alert. If any decisions needs to be made will include husband in them   Abuse/Neglect Abuse/Neglect Assessment Can Be Completed: Yes Physical Abuse: Denies Verbal Abuse: Denies Sexual Abuse: Denies Exploitation of patient/patient's resources: Denies Self-Neglect: Denies  Emotional Status Pt's affect, behavior and adjustment status: Pt voiced she is coming around and this CVA scared her she knows now she will need to participate and do what she can for herself. She would allow her caregiver and husband care for her and not try, but plans to try and get as much back as she can from this CVA. Pt seems to have a new perspective regarding MS and CVA now. Recent Psychosocial Issues: other health issues-MS Psychiatric History: History of depression/anxiety takes medications for this and finds them helpful, but would benefit from seeing neruo-psych while here Substance Abuse History: No issues  Patient / Family Perceptions, Expectations & Goals Pt/Family understanding of illness & functional limitations: Pt and husband can explan her stroke deficits and MS. Both feel it was a blessing when she had the CVA she realizes she is like her Mom with BP issues and needs to  do something. Both talk with the MD and feel they have a good understanding or her condition and treatment plan moving forward. Pt is more future oriented now. Premorbid pt/family roles/activities: Mom, wife, retiree, grandmother, church  member, etc Anticipated changes in roles/activities/participation: resume Pt/family expectations/goals: Pt states: " I need to open up and contribute to the conversation with my husband, in the past I have been clsoed to doing this."  Husband states: " I am glad she is realizing we need to plan for the future, for [place] both. This stroke happened all of sudden and now she realizes this can happen."  [employer]: Other (Comment) (had HH in the past-has private duty caregiver 8 hr-increase to 5 days per week) Premorbid Home Care/DME Agencies: Other (Comment) (has rollator) Transportation available at discharge: Husband Resource referrals recommended: Neuropsychology  Discharge Planning Living Arrangements: Spouse/significant other Support Systems: Spouse/significant other,Children,Other relatives,Friends/neighbors Type of Residence: Private residence Insurance Resources: [employer] (specify) Nurse, mental health) Financial Screen Referred: No Living Expenses: Own Money Management: Patient,Spouse Does the patient have any problems obtaining your medications?: No Home Management: Caregiver Patient/Family Preliminary Plans: Husband wants to start to explore lookking at [place] and [place] to plan for their future. Discussed with both they need to reach out to both and schedule appointments, since insterested in the [facility]-independent living but having the option for higher level if needed. SO short term plan to go home and then transition to one of these facilities if opening. Care Coordinator Anticipated Follow Up Needs: HH/OP  Clinical Impression Pleasant female who is willing to push herself now here and realize this is her chance to make some improvement in her stroke deficits and MS. Husband is very supportive and assisted with her care along with a paid caregiver. Both in agreement now to look into [place] to look at future  needs. Pt is more open then she was and plans to discuss with husband. Pt feels this is a wakeup call from God to get her attention and she plans to make the most out of it. Will work on safe discharge option for pt and husband.   Elease Hashimoto 12/14/2020, 11:42 AM

## 2020-12-14 NOTE — Progress Notes (Signed)
Inpatient Rehabilitation Center Individual Statement of Services  Patient Name:  GENICE KIMBERLIN  Date:  12/14/2020  Welcome to the [facility].  Our goal is to provide you with an individualized program based on your diagnosis and situation, designed to meet your specific needs.  With this comprehensive rehabilitation program, you will be expected to participate in at least 3 hours of rehabilitation therapies Monday-Friday, with modified therapy programming on the weekends.  Your rehabilitation program will include the following services:  Physical Therapy (PT), Occupational Therapy (OT), Speech Therapy (ST), 24 hour per day rehabilitation nursing, Neuropsychology, Care Coordinator, Rehabilitation Medicine, Nutrition Services and Pharmacy Services  Weekly team conferences will be held on Wednesday to discuss your progress.  Your Inpatient Rehabilitation Care Coordinator will talk with you frequently to get your input and to update you on team discussions.  Team conferences with you and your family in attendance may also be held.  Expected length of stay: 18-21 days  Overall anticipated outcome: overall min assist level  Depending on your progress and recovery, your program may change. Your Inpatient Rehabilitation Care Coordinator will coordinate services and will keep you informed of any changes. Your Inpatient Rehabilitation Care Coordinator's name and contact numbers are listed  below.  The following services may also be recommended but are not provided by the [facility]:    [place] will be made to provide these services after discharge if needed.  Arrangements include referral to agencies that provide these services.  Your insurance has been verified to be:  [employer] Your primary doctor is:  Einar Pheasant  Pertinent information will be shared with your  doctor and your insurance company.  Inpatient Rehabilitation Care Coordinator:  Ovidio Kin, [place] or Emilia Beck  Information discussed with and copy given to patient by: Elease Hashimoto, 12/14/2020, 11:47 AM

## 2020-12-14 NOTE — Progress Notes (Signed)
PROGRESS NOTE   Subjective/Complaints: L AFO ordered Discussed with SW husband's hopes for discharge Discussed with Loma Sousa SLP eval- will be scheduled over weekend. Admission labs stable  ROS: Had BM with suppository  Objective:   No results found. Recent Labs    12/14/20 0603  WBC 9.0  HGB 14.0  HCT 43.6  PLT 330   Recent Labs    12/14/20 0603  NA 141  K 4.2  CL 105  CO2 26  GLUCOSE 129*  BUN 12  CREATININE 0.75  CALCIUM 9.9    Intake/Output Summary (Last 24 hours) at 12/14/2020 0840 Last data filed at 12/14/2020 0802 Gross per 24 hour  Intake 560 ml  Output --  Net 560 ml        Physical Exam: Vital Signs Blood pressure 135/90, pulse 92, temperature 98.6 F (37 C), temperature source Oral, resp. rate 18, height 5\' 3"  (1.6 m), weight 78.4 kg, SpO2 96 %. Gen: no distress, normal appearing HEENT: oral mucosa pink and moist, NCAT Cardio: Reg rate Chest: normal effort, normal rate of breathing Abd: soft, non-distended Ext: no edema Psych: pleasant, normal affect Genitourinary:    Comments: R labial irritation/redness- and surrounding area less red; per pt, due to [place]- is covered in powder in groin area, and under breasts Musculoskeletal:     Cervical back: Normal range of motion and neck supple.     Comments: RUE- Bicep, triceps, WE, 5-/5, grip 4+/5 and finger abd 4/5 LUE- biceps, triceps, and WE 5-/5, grip 4/5, and finger abd 4-/5 RLE- HF 4+/5, KE 4+/5, DF 4-/5, and PF 5-/5 LLE- HF 4+/5, KE 4/5, DF 2+/5, and PF 4/5 Has moderate L foot drop noted- from MS  Skin:    Comments: External hemorrhoids- stage II A few bruises on Arms B/L Backside no skin breakdown nor heels B/L   Neurological:     Comments: Ox3- and able to tell me next holiday- even day/date/month and year as well as location and president.  Intact to light touch in all 4 extremities MAS of 2-3 in LLE- no clonus- and maybe 1+-2  at L ankle, , but worse MAS (2-3) at L hip and knee. No spasticity seen on R side.    Psychiatric:     Comments: Appropriate, but had some slowed processing in answering questions, accessing memory.     Assessment/Plan: 1. Functional deficits which require 3+ hours per day of interdisciplinary therapy in a comprehensive inpatient rehab setting.  Physiatrist is providing close team supervision and 24 hour management of active medical problems listed below.  Physiatrist and rehab team continue to assess barriers to discharge/monitor patient progress toward functional and medical goals  Care Tool:  Bathing              Bathing assist       Upper Body Dressing/Undressing Upper body dressing   What is the patient wearing?: Pull over shirt    Upper body assist Assist Level: Maximal Assistance - Patient 25 - 49%    Lower Body Dressing/Undressing Lower body dressing      What is the patient wearing?: Underwear/pull up,Pants     Lower body assist Assist  for lower body dressing: Total Assistance - Patient < 25%     Toileting Toileting    Toileting assist Assist for toileting: Total Assistance - Patient < 25%     Transfers Chair/bed transfer  Transfers assist     Chair/bed transfer assist level: 2 Helpers     Locomotion Ambulation   Ambulation assist              Walk 10 feet activity   Assist           Walk 50 feet activity   Assist           Walk 150 feet activity   Assist           Walk 10 feet on uneven surface  activity   Assist           Wheelchair     Assist               Wheelchair 50 feet with 2 turns activity    Assist            Wheelchair 150 feet activity     Assist          Blood pressure 135/90, pulse 92, temperature 98.6 F (37 C), temperature source Oral, resp. rate 18, height 5\' 3"  (1.6 m), weight 78.4 kg, SpO2 96 %.  Medical Problem List and Plan: 1.  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  Antithrombotics: -DVT/anticoagulation:  Pharmaceutical: Continue Lovenox             -antiplatelet therapy: Plavix  3. Chronic LBP/Pain Management: Tylenol daily 4. Mood: Team to provide ego support.              -antipsychotic agents: N/A 5. Neuropsych: This patient is not fully capable of making decisions on her own behalf. 6. Skin/Wound Care: Routine pressure relief measures.  7. Fluids/Electrolytes/Nutrition: Monitor I/O. Electrolytes reviewed and are stable. Repeat Monday 8. H/o depression w/GAD: Continue Ativan 0.25 mg/hs, Cymbalta 60 mg and Remeron 45 mg/hs 9. H/o MS: on no disease modifying agents; Continue Ampyra daily. Has some spasticity and L foot drop- might benefit from spasticity treatment as well as L AFO?  10. Dyslipidemia: Chol 243/LDL-143             --Continue Lipitor daily 11. Constipation- wants a suppository this evening- will order and also give prn; will add Probiotics per pt request.  12, Cognitive decline: SLP ordered- provided with a research study regarding benefits of anti-inflammatory diet for cognition, provided counseling to patient and family regarding optimal nutrition to enhance cognition.  13. Disposition: Husband prefers discharge to [facility] [place] or [facility]- discussed with Jacqlyn Larsen and she will apply, discuss likelihood of admission with husband. If she is unable to get admission, husband can increase days of home health aide to 5 days per week. He is getting burned out by caring for her. Does have three sons and a very supportive daughter-in-law. Would like aide to be involved in caregiver training toward the end of stay.     LOS: 1 days A FACE TO FACE EVALUATION WAS PERFORMED  Fraser Busche P Brinna Divelbiss 12/14/2020, 8:40 AM

## 2020-12-15 NOTE — IPOC Note (Signed)
Overall Plan of Care [facility]) Patient Details Name: Carly [name] MRN: [id number] DOB: 21-Jan-1951  Admitting Diagnosis: White matter periventricular infarction [facility])  Hospital Problems: Principal Problem:   White matter periventricular infarction [facility])     Functional Problem List: Nursing Bladder,Pain,Bowel,Edema,Safety,Endurance,Medication Management  PT Balance,Behavior,Edema,Endurance,Motor,Perception,Safety,Skin Integrity  OT Balance,Cognition,Endurance,Motor,Perception,Safety  SLP    TR         Basic ADL's: OT Grooming,Eating,Bathing,Dressing,Toileting     Advanced  ADL's: OT       Transfers: PT Bed Mobility,Bed to Chair,Car,Furniture  OT Toilet,Tub/Shower     Locomotion: PT Ambulation,Wheelchair Mobility,Stairs     Additional Impairments: OT Fuctional Use of Upper Extremity  SLP        TR      Anticipated Outcomes Item Anticipated Outcome  Self Feeding Supervision/setup  Swallowing      Basic self-care  Min assist  Toileting  Min assist   Bathroom Transfers Min assist  Bowel/Bladder  To be continent x 2  Transfers  Min assist with LRAD  Locomotion  Min assist with LRAD at house hold ambulatory level  Communication     Cognition     Pain  less than 3 out of 10  Safety/Judgment  to be fall free while in rehab   Therapy Plan: PT Intensity: Minimum of 1-2 x/day ,45 to 90 minutes PT Frequency: 5 out of 7 days PT Duration Estimated Length of Stay: 18-21 days OT Intensity: Minimum of 1-2 x/day, 45 to 90 minutes OT Frequency: 5 out of 7 days OT Duration/Estimated Length of Stay: 18-21 days     Due to the current state of emergency, patients may not be receiving their 3-hours of Medicare-mandated therapy.   Team Interventions: Nursing Interventions Patient/Family Education,Disease Management/Prevention,Discharge Planning,Bladder Management,Pain Management,Cognitive Remediation/Compensation,Psychosocial Support,Bowel Management,Medication  Management  PT interventions Ambulation/gait training,Balance/vestibular training,Cognitive remediation/compensation,Disease management/prevention,Discharge planning,Community reintegration,DME/adaptive equipment instruction,Functional electrical stimulation,Functional mobility training,Patient/family education,Psychosocial support,Neuromuscular re-education,Skin care/wound management,Pain management,Splinting/orthotics,Stair training,UE/LE Strength taining/ROM,UE/LE Coordination activities,Therapeutic Activities,Therapeutic Exercise,Visual/perceptual remediation/compensation,Wheelchair propulsion/positioning  OT Interventions Balance/vestibular training,Cognitive remediation/compensation,Community reintegration,Discharge planning,Disease Public affairs consultant stimulation,Functional mobility training,Neuromuscular re-education,Pain management,Patient/family education,Psychosocial support,Self Care/advanced ADL retraining,Therapeutic Activities,Therapeutic Exercise,UE/LE Strength taining/ROM,UE/LE Coordination activities  SLP Interventions    TR Interventions    SW/CM Interventions Discharge Planning,Psychosocial Support,Patient/Family Education   Barriers to Discharge MD  Medical stability  Nursing      PT Decreased caregiver support,Home environment access/layout,Behavior    OT Decreased caregiver support,Home Copywriter, advertising    SLP      SW       Team Discharge Planning: Destination: PT-Home ,OT- Home , SLP-  Projected Follow-up: PT-Home health PT, OT-  Home health OT,24 hour supervision/assistance, SLP-  Projected Equipment Needs: PT-To be determined, OT- To be determined, SLP-  Equipment Details: PT- , OT-  Patient/family involved in discharge planning: PT- Patient,  OT-Patient, SLP-   MD ELOS: 2 weeks Medical Rehab Prognosis:  Good Assessment: Mrs. Boerema is a 70 year old woman who is admitted to CIR with white  matter periventricular infarction. L AFO has been ordered for left foot drop. SLP eval has been added for cognitive deficits. Labs are being monitored weekly and vital signs are being monitored TID.   See Team Conference Notes for weekly updates to the plan of care

## 2020-12-15 NOTE — Progress Notes (Signed)
Occupational Therapy Session Note  Patient Details  Name: Carly [name] MRN: [id number] Date of Birth: [birth year]  Today's Date: 12/15/2020 OT Individual Time: 1400-1510 OT Individual Time Calculation (min): 70 min    Short Term Goals: Week 1:  OT Short Term Goal 1 (Week 1): Pt will complete sit > stand with max assist of one caregiver to progress towards standing for LB self-care tasks OT Short Term Goal 2 (Week 1): Pt will complete LB dressing with max assist of one caregiver OT Short Term Goal 3 (Week 1): Pt will complete toilet transfer with mod assist stand vs squat pivot OT Short Term Goal 4 (Week 1): Pt will complete bathing with mod assist  Skilled Therapeutic Interventions/Progress Updates:     1;1. Pt received in recliner agreeable to OT. Pt completes stand pivot transfer with RW and MOD A overall with posterior lean and pt distracted by walker requiring VC for proxitmity to walker and sequencing stepping to w/c.  Total A transport to tx gym for time management. Pt wanting to work on [place].  Pt provided with foam cubes to work on tip to tip pinch and finger>palm manipulation and place into cup.  Theraputty exercises with tan super soft putty provided as follows with demo cuing.  Access Code: [email or web address] URL: [email or web address] Date: 12/15/2020 Prepared by: Mariane Masters  Exercises Putty Squeezes - 1 x daily - 7 x weekly - 3 sets - 10 reps Tip Pinch with Putty - 1 x daily - 7 x weekly - 3 sets - 10 reps Finger Pinch and Pull with Putty - 1 x daily - 7 x weekly - 3 sets - 10 reps Marble Pick Up from Putty - 1 x daily - 7 x weekly - 3 sets - 10 reps Seated Finger Extension with Putty - 1 x daily - 7 x weekly - 3 sets - 10 reps  Upon returning to room, pt requesting to toilet. Pt utilized stedy d/t fatigue for transfer and toileting components with MIN A for sit to stand VC for upright posture and terminal hip extension and MAX A for toileting  components. Exited session with pt seated in bed, exit alarm on and call light in reach    Therapy Documentation Precautions:  Precautions Precautions: Fall Precaution Comments: R hemi, has MS with rigid LLE Restrictions Weight Bearing Restrictions: No General:   Vital Signs: Therapy Vitals Temp: 97.9 F (36.6 C) Temp Source: Oral Pulse Rate: 87 Resp: 17 BP: 122/69 Patient Position (if appropriate): Sitting Oxygen Therapy SpO2: 97 % O2 Device: Room Air Pain: Pain Assessment Pain Scale: 0-10 Pain Score: 5  Pain Type: Chronic pain Pain Location: Neck Pain Orientation: Posterior Pain Descriptors / Indicators: Aching Pain Frequency: Intermittent Pain Onset: On-going Patients Stated Pain Goal: 2 Pain Intervention(s): Medication (See eMAR) ADL: ADL Upper Body Bathing: Minimal assistance Where Assessed-Upper Body Bathing: Edge of bed Lower Body Bathing: Maximal assistance Where Assessed-Lower Body Bathing: Edge of bed Upper Body Dressing: Moderate assistance Where Assessed-Upper Body Dressing: Edge of bed Lower Body Dressing: Dependent Where Assessed-Lower Body Dressing: Edge of bed Toileting: Dependent (Mod A sit > stand with Charlaine Dalton) Where Assessed-Toileting: Bedside Commode Toilet Transfer: Dependent Customer service manager) Toilet Transfer Method:  Charlaine Dalton) Science writer: Forensic psychologist   Exercises:   Other Treatments:     Therapy/Group: Individual Therapy  Tonny Branch 12/15/2020, 12:59 PM

## 2020-12-15 NOTE — Progress Notes (Signed)
Physical Therapy Session Note  Patient Details  Name: Carly [name] MRN: [id number] Date of Birth: 1950-10-16  Today's Date: 12/15/2020 PT Individual Time: 0950-1044 PT Individual Time Calculation (min): 54 min   Short Term Goals: Week 1:  PT Short Term Goal 1 (Week 1): Pt will perform bed mobility with min assist ang use of bed features as needed PT Short Term Goal 2 (Week 1): Pt will performed stand pivot transfer to [facility] with mod assist consistently PT Short Term Goal 3 (Week 1): Pt wil ambulate 76ft with mod assist and LRAD PT Short Term Goal 4 (Week 1): Pt will propell WC 157ft with supervision assist Week 2:    Week 3:     Skilled Therapeutic Interventions/Progress Updates:    Pain:  Pt reports no pain.  Treatment to tolerance.  Rest breaks and repositioning as needed.  Pt initially oob in recliner and agreeable to treatment session w/focus on transfers, gait, coordination, NMRE.   Pt dons shoes and L AFO w/max assist.  Sit to stand from recliner to [facility] w/cga.  Transferred to wc via Stedy.  Stand to sit w/min assist.  Pt very upset stating that unnamed employee was insensitive to her stating that she was "dropping everything".  Also admits she has been labile since CVA.  Emotional support provided.  Once in wc, pt transported to gym for continued session. Sit to stand w/mod assist to RW. Gait 107ft w/mod assist for straight path, bilat genuvalgum, poor clearance but advances bilat LEs without assist, cues for upright posture and gaze.  Very poor safety awareness demonstrated w/attempted turning and backing to mat to sit, max assist required to complete turn and lower safely to sitting on mat.  In sitting worked on RUE coordination via grasping 1in cubes and transferring from R side of table to L using R hand only.    Pt transported back to room.  Sit to stand in stedy w/cga.  Transferred to recliner via stedy.  Pt left oob in recliner w/chair alarm set and needs in reach.  Handed  over to nursing for meds.    Therapy Documentation Precautions:  Precautions Precautions: Fall Precaution Comments: R hemi, has MS with rigid LLE Restrictions Weight Bearing Restrictions: No    Therapy/Group: Individual Therapy  Callie Fielding, [place] 12/15/2020, 4:09 PM

## 2020-12-15 NOTE — Progress Notes (Signed)
PROGRESS NOTE   Subjective/Complaints:  No issues overnite except someone criticized her for not holding on to things (pt declined to say who)  Emotionally labile  ROS: Had BM with suppository  Objective:   No results found. Recent Labs    12/14/20 0603  WBC 9.0  HGB 14.0  HCT 43.6  PLT 330   Recent Labs    12/14/20 0603  NA 141  K 4.2  CL 105  CO2 26  GLUCOSE 129*  BUN 12  CREATININE 0.75  CALCIUM 9.9    Intake/Output Summary (Last 24 hours) at 12/15/2020 0955 Last data filed at 12/15/2020 0755 Gross per 24 hour  Intake 600 ml  Output --  Net 600 ml        Physical Exam: Vital Signs Blood pressure 126/84, pulse 98, temperature 98.2 F (36.8 C), temperature source Oral, resp. rate 14, height 5\' 3"  (1.6 m), weight 78.4 kg, SpO2 98 %.  General: No acute distress Mood and affect are appropriate Heart: Regular rate and rhythm no rubs murmurs or extra sounds Lungs: Clear to auscultation, breathing unlabored, no rales or wheezes Abdomen: Positive bowel sounds, soft nontender to palpation, nondistended Extremities: No clubbing, cyanosis, or edema  Musculoskeletal:     Cervical back: Normal range of motion and neck supple.     Comments: RUE- Bicep, triceps, WE, 5-/5, grip 4+/5 and finger abd 4/5 LUE- biceps, triceps, and WE 5-/5, grip 4/5, and finger abd 4-/5 RLE- HF 4+/5, KE 4+/5, DF 4-/5, and PF 5-/5 LLE- HF 4+/5, KE 4/5, DF 2+/5, and PF 4/5 Has moderate L foot drop noted- from MS  Skin:    Comments: External hemorrhoids- stage II A few bruises on Arms B/L Backside no skin breakdown nor heels B/L   Neurological:     Comments: Ox3- and able to tell me next holiday- even day/date/month and year as well as location and president.  Intact to light touch in all 4 extremities MAS of 2-3 in LLE- no clonus- and maybe 1+-2 at L ankle, , but worse MAS (2-3) at L hip and knee. No spasticity seen on R side.     Psychiatric:     Comments: Appropriate, but had some slowed processing in answering questions, accessing memory.     Assessment/Plan: 1. Functional deficits which require 3+ hours per day of interdisciplinary therapy in a comprehensive inpatient rehab setting.  Physiatrist is providing close team supervision and 24 hour management of active medical problems listed below.  Physiatrist and rehab team continue to assess barriers to discharge/monitor patient progress toward functional and medical goals  Care Tool:  Bathing    Body parts bathed by patient: Right arm,Chest,Abdomen,Face   Body parts bathed by helper: Left arm,Front perineal area,Buttocks,Right upper leg,Left upper leg,Right lower leg,Left lower leg     Bathing assist Assist Level: Maximal Assistance - Patient 24 - 49%     Upper Body Dressing/Undressing Upper body dressing   What is the patient wearing?: Pull over shirt    Upper body assist Assist Level: Maximal Assistance - Patient 25 - 49%    Lower Body Dressing/Undressing Lower body dressing      What is  the patient wearing?: Pants     Lower body assist Assist for lower body dressing: Dependent - Patient 0% (sit > stand in [place])     Chartered loss adjuster assist Assist for toileting: Maximal Assistance - Patient 25 - 49%     Transfers Chair/bed transfer  Transfers assist     Chair/bed transfer assist level: Maximal Assistance - Patient 25 - 49%     Locomotion Ambulation   Ambulation assist   Ambulation activity did not occur: Safety/medical concerns          Walk 10 feet activity   Assist  Walk 10 feet activity did not occur: Safety/medical concerns        Walk 50 feet activity   Assist Walk 50 feet with 2 turns activity did not occur: Safety/medical concerns         Walk 150 feet activity   Assist Walk 150 feet activity did not occur: Safety/medical concerns         Walk 10 feet on uneven surface   activity   Assist Walk 10 feet on uneven surfaces activity did not occur: Safety/medical concerns         Wheelchair     Assist   Type of Wheelchair: Manual    Wheelchair assist level: Minimal Assistance - Patient > 75% Max wheelchair distance: 150    Wheelchair 50 feet with 2 turns activity    Assist        Assist Level: Minimal Assistance - Patient > 75%   Wheelchair 150 feet activity     Assist      Assist Level: Minimal Assistance - Patient > 75%   Blood pressure 126/84, pulse 98, temperature 98.2 F (36.8 C), temperature source Oral, resp. rate 14, height 5\' 3"  (1.6 m), weight 78.4 kg, SpO2 98 %.  Medical Problem List and Plan: 1.  R hemiparesis secondary to L deep white matter/ corona radiata stroke in setting of L hemiparesis and L foot drop from MS             -patient may  shower             -ELOS/Goals: 12-16 days- min A to supervision  -CIR PT, OT, SLP 2.  Antithrombotics: -DVT/anticoagulation:  Pharmaceutical: Continue Lovenox             -antiplatelet therapy: Plavix  3. Chronic LBP/Pain Management: Tylenol daily 4. Mood: Team to provide ego support.              -antipsychotic agents: N/A 5. Neuropsych: This patient is not fully capable of making decisions on her own behalf. 6. Skin/Wound Care: Routine pressure relief measures.  7. Fluids/Electrolytes/Nutrition: Monitor I/O. Electrolytes reviewed and are stable. Repeat Monday 8. H/o depression w/GAD: Continue Ativan 0.25 mg/hs, Cymbalta 60 mg and Remeron 45 mg/hs 9. H/o MS: on no disease modifying agents; Continue Ampyra daily. Has some spasticity and L foot drop- might benefit from spasticity treatment, has L AFO  10. Dyslipidemia: Chol 243/LDL-143             --Continue Lipitor daily 11. Constipation- wants a suppository this evening- will order and also give prn; will add Probiotics per pt request.  12, Cognitive decline: SLP ordered- provided with a research study regarding  benefits of anti-inflammatory diet for cognition, provided counseling to patient and family regarding optimal nutrition to enhance cognition.  13. Disposition: Husband prefers discharge to [facility] or [facility]- discussed with Jacqlyn Larsen and she will  apply, discuss likelihood of admission with husband. If she is unable to get admission, husband can increase days of home health aide to 5 days per week. He is getting burned out by caring for her. Does have three sons and a very supportive daughter-in-law. Would like aide to be involved in caregiver training toward the end of stay.    14.  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

## 2020-12-15 NOTE — Evaluation (Signed)
Speech Language Pathology Assessment and Plan  Patient Details  Name: Carly [name] MRN: [id number] Date of Birth: April 22, 1951  SLP Diagnosis: Cognitive Impairments  Rehab Potential: Good ELOS: 18-21 days   Today's Date: 12/15/2020 SLP Individual Time: 0805-0900 SLP Individual Time Calculation (min): 28 min  Hospital Problem: Principal Problem:   White matter periventricular infarction [facility])  Past Medical History:  Past Medical History:  Diagnosis Date  . Allergy   . Anxiety   . Aspiration pneumonia ([place])   . Depression   . Frequent headaches    H/O  . GERD (gastroesophageal reflux disease)   . History of chicken pox   . History of colon polyps   . Hx of migraines   . Multiple sclerosis ([place])   . PONV (postoperative nausea and vomiting)    Past Surgical History:  Past Surgical History:  Procedure Laterality Date  . BACK SURGERY    . CHOLECYSTECTOMY    . COLONOSCOPY WITH PROPOFOL N/A 05/18/2017   Procedure: COLONOSCOPY WITH PROPOFOL;  Surgeon: [name], MD;  Location: [facility] ENDOSCOPY;  Service: Endoscopy;  Laterality: N/A;  . FOOT SURGERY  2015  . GALLBLADDER SURGERY  2008  . HARDWARE REMOVAL Left 02/14/2016   Procedure: LEFT FOOT REMOVAL DEEP IMPLANT;  Surgeon: [name], MD;  Location: [facility];  Service: Orthopedics;  Laterality: Left;  . HERNIA REPAIR     Inguinal Hernia Repair  . [place]  2014    Assessment / Plan / Recommendation Clinical Impression Patient is a 70 year old female with history of MS with LLE weakness--on Ampyra for gait disorder, GERD, anxiety/depression, HA who was admitted on 12/08/20 with one day history of weakness with difficulty standing, dizziness, mental status changes and difficulty picking items with right hand. 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 MRA brain negative for stenosis or LVO but  showed diffuse atherosclerotic irregularity --most severe in PCA branches. 2D echo showed EF 60-65% with no wall abnormalities. Carotid dopplers negative for ICA stenosis. Neurology felt that stroke was due to small vessel disease and Plavix added  Patient transferred to CIR on 12/13/2020 .   Pt presents with moderate cognitive communication impairment impacting her immediate, delayed and short term recall, problem solving, self-monitoring, awareness/insight and attention. Patient fully oriented however demonstrates significantly impaired insight into current cognitive and physical impairments (pt with multiple reports of no lingering deficits from CVA despite being unable to feed self with R hand, hold pen/write, poor STM, etc). SLUMS score 14/26 (adjusted due to pt unable to write for clock drawing) with significant deficits in mental flexibility, verbal fluency, attention, and working memory. Pt administered "Daily Math Problems" portion of ALFA with a score of 4/10. Pt noted to answer questions impulsively throughout assessment, extra time moderately effective in increase accuracy of tasks. Recommend ongoing standardized cognitive testing to further determine new baseline and needs at discharge. Pt does endorse previously relying on husband "too much" and wants to regain independence lost since MS diagnosis. Pt will benefit from skilled ST in CIR setting to increase safety and independent with daily routine.    Skilled Therapeutic Interventions          Pt participating in speech language/cogntive assessment via the SLUMS, portion of the ALFA and further non-standardized probes.    SLP Assessment  Patient will need skilled Speech Lanaguage Pathology Services during CIR admission  Recommendations  Recommendations for Other Services: Neuropsych consult Patient destination: Home Follow up Recommendations: Outpatient SLP Equipment Recommended: To be determined    SLP Frequency 3 to 5 out of 7 days    SLP Duration  SLP Intensity  SLP Treatment/Interventions 18-21 days  Minumum of 1-2 x/day, 30 to 90 minutes  Cognitive remediation/compensation;Therapeutic Activities;Therapeutic Exercise;Functional tasks;Cueing hierarchy;Internal/external aids;Medication managment;Patient/family education    Pain Pain Assessment Pain Scale: 0-10 Pain Score: 0-No pain  Prior Functioning Cognitive/Linguistic Baseline: Within functional limits Baseline deficit details: possible mild cognitive deficits vs. depression vs MS flares Type of Home: House  Lives With: Spouse Available Help at Discharge: Family;Available 24 hours/day;Personal care attendant;Available PRN/intermittently  SLP Evaluation Cognition Overall Cognitive Status: Impaired/Different from baseline Arousal/Alertness: Awake/alert Orientation Level: Oriented X4 Attention: Focused Focused Attention: Impaired Focused Attention Impairment: Functional basic;Verbal basic Sustained Attention: Impaired Sustained Attention Impairment: Verbal basic;Functional basic Memory: Impaired Memory Impairment: Retrieval deficit;Decreased short term memory Awareness: Impaired Problem Solving: Impaired Problem Solving Impairment: Verbal basic;Verbal complex Behaviors: Impulsive;Lability Safety/Judgment: Impaired  Comprehension Auditory Comprehension Overall Auditory Comprehension: Appears within functional limits for tasks assessed Yes/No Questions: Within Functional Limits Commands: Within Functional Limits Visual Recognition/Discrimination Discrimination: Within Function Limits Reading Comprehension Reading Status: Not tested Expression Expression Primary Mode of Expression: Verbal Verbal Expression Overall Verbal Expression: Appears within functional limits for tasks assessed Initiation: No impairment Level of Generative/Spontaneous Verbalization: Conversation Repetition: No impairment Naming: No impairment Written  Expression Dominant Hand: Right Written Expression: Exceptions to [facility] Oral Motor Oral Motor/Sensory Function Overall Oral Motor/Sensory Function: Within functional limits Motor Speech Overall Motor Speech: Appears within functional limits for tasks assessed Respiration: Within functional limits  Care Tool Care Tool Cognition Expression of Ideas and Wants Expression of Ideas and Wants: Some difficulty - exhibits some difficulty with expressing needs and ideas (e.g, some words or finishing thoughts) or speech is not clear   Understanding Verbal and Non-Verbal Content Understanding Verbal and Non-Verbal Content: Usually understands - understands most conversations, but misses some part/intent of message. Requires cues at times to understand   Memory/Recall Ability *first 3 days only Memory/Recall Ability *first 3 days only: Current season;That he or she is in a hospital/hospital unit;Staff names and faces    Short Term Goals: Week 1: SLP Short Term Goal 1 (Week 1): Patient will perform mild complex problem solving tasks (medication management, financial management) with mod A. SLP Short Term Goal 2 (Week 1): Patient will demonstrate increased insight/awareness to deficits and errors during task production and attempt to correct with min A cues. SLP Short Term Goal 3 (Week 1): SLP with administer formal cognitive assessment SLP Short Term Goal 4 (Week 1): Pt will increase functional recall with immediate and short term delay provided mod A cues for use of compensatory strategies  Refer to Care Plan for Long Term Goals  Recommendations for other services: Neuropsych  Discharge Criteria: Patient will be discharged from SLP if patient refuses treatment 3 consecutive times without medical reason, if treatment goals not met, if there is a change in medical status, if patient makes no progress towards goals or if patient is discharged from hospital.  The above assessment, treatment plan,  treatment alternatives and goals were discussed and mutually agreed upon: by patient  Dewaine Conger 12/15/2020, 9:00 AM

## 2020-12-16 NOTE — Progress Notes (Signed)
  Chaplain responded to spiritual care consult, pt. Requests prayer.  Pt is a long-time member of [employer] in [place] (non-denominational), but has only watched services online due to pandemic.  When asked, pt gave permission for chaplain to call church to be added to prayer list (which chaplain did).  Pt reports feeling gratitude following this health crisis, for her recovered health and for a sense of protection from God.  Pt had been experienced (but wasn't aware of) increasing irritability before recent admission/crisis.  She reports a desire for counseling as she is beginning to recognize her tendency to self-isolate and hold in emotions. Chaplain encouraged this and advised pt to share with doctor and to check with resources from her insurance. Pt. Is a retired Pharmacist, hospital.  She and husband will celebrate 86 years of marriage on 03/18/21.  Pt. Became teary as she reflected on recent illness, God's presence, and during prayer.    Please contact as needed or requested.  [place], [place] [phone]    12/16/20 1000  Clinical Encounter Type  Visited With Patient  Visit Type Initial;Spiritual support  Referral From Patient  Consult/Referral To Chaplain  Spiritual Encounters  Spiritual Needs Prayer;Emotional  Stress Factors  Patient Stress Factors Health changes

## 2020-12-17 ENCOUNTER — Other Ambulatory Visit: Payer: Self-pay

## 2020-12-17 LAB — BASIC METABOLIC PANEL
Anion gap: 12 (ref 5–15)
BUN: 9 mg/dL (ref 8–23)
CO2: 21 mmol/L — ABNORMAL LOW (ref 22–32)
Calcium: 9.3 mg/dL (ref 8.9–10.3)
Chloride: 105 mmol/L (ref 98–111)
Creatinine, Ser: 0.7 mg/dL (ref 0.44–1.00)
GFR, Estimated: >60 mL/min (ref >60)
Glucose, Bld: 113 mg/dL — ABNORMAL HIGH (ref 70–99)
Potassium: 3.8 mmol/L (ref 3.5–5.1)
Sodium: 138 mmol/L (ref 135–145)

## 2020-12-17 LAB — CBC
HCT: 39.9 % (ref 36.0–46.0)
Hemoglobin: 13.4 g/dL (ref 12.0–15.0)
MCH: 28.7 pg (ref 26.0–34.0)
MCHC: 33.6 g/dL (ref 30.0–36.0)
MCV: 85.4 fL (ref 80.0–100.0)
Platelets: 283 10*3/uL (ref 150–400)
RBC: 4.67 MIL/uL (ref 3.87–5.11)
RDW: 14.6 % (ref 11.5–15.5)
WBC: 6.8 10*3/uL (ref 4.0–10.5)
nRBC: 0 % (ref 0.0–0.2)

## 2020-12-17 NOTE — Progress Notes (Signed)
Physical Therapy Session Note  Patient Details  Name: Carly [name] MRN: [id number] Date of Birth: November 18, 1950  Today's Date: 12/17/2020 PT Individual Time: 8338-2505 PT Individual Time Calculation (min): 50 min   Short Term Goals: Week 1:  PT Short Term Goal 1 (Week 1): Pt will perform bed mobility with min assist ang use of bed features as needed PT Short Term Goal 2 (Week 1): Pt will performed stand pivot transfer to [facility] with mod assist consistently PT Short Term Goal 3 (Week 1): Pt wil ambulate 64ft with mod assist and LRAD PT Short Term Goal 4 (Week 1): Pt will propell WC 120ft with supervision assist  Skilled Therapeutic Interventions/Progress Updates:    Patient in recliner and reports she worked on washing hair today.  Reports has not walked today.  Sit to stand to RW and ambulated with mod to max A x 12' to w/c in room with difficulty progressing L LE with facilitation for R lateral weight shift to improve L foot progression and A to keep walker close and for safety turning eventually pushing chair up to her. Pushed in w/c to ortho gym and applied L AFO that was in her room.  Patient ambulated 65' with 2 turns with RW mod to max A to keep walker close to assist with lateral weight shifts and with cues for LE progression, R LE buckling at times and posterior and R LOB max A to recover.    Sit to supine on mat with mod A for LE's.  Patient performed bridging w/5 sec hold mod cues for breathing x 10 reps, hooklying hip flexion x 10 again cues for breathing and sidelying hip clamshell abduction x 10 with min A to roll and mod cues for technique.  Also performed L LE hip extension and hamstring curls while in R sidelying and L LE supported.  Patient from R sidelying to sit with min to mod A and max cues for managing to push with L UE to get R elbow closer then to push up through R arm.  Patient stand pivot to w/c with RW and mod A.    Pushed in w/c to room and pt requesting to toilet so  transferred via [facility] lift and needing min to mod A for clothing management.  Transferred to bed via [place] and pt to supine with mod A for LE's.  Left with call bell and needs in reach and bed alarm active.   Therapy Documentation Precautions:  Precautions Precautions: Fall Precaution Comments: R hemi, has MS with rigid LLE Restrictions Weight Bearing Restrictions: No Pain: Pain Assessment Pain Scale: 0-10 Pain Score: 4  Faces Pain Scale: Hurts little more Pain Type: Chronic pain Pain Location: Neck Pain Orientation: Mid Pain Descriptors / Indicators: Discomfort Pain Frequency: Intermittent Pain Onset: On-going Patients Stated Pain Goal: 2 Pain Intervention(s): Repositioned    Therapy/Group: Individual Therapy  Reginia Naas  Taylor, PT 12/17/2020, 5:06 PM

## 2020-12-17 NOTE — Progress Notes (Signed)
Occupational Therapy Session Note  Patient Details  Name: Carly [name] MRN: [id number] Date of Birth: 29-Nov-1950  Today's Date: 12/17/2020 OT Individual Time: 1351-1433 OT Individual Time Calculation (min): 42 min    Short Term Goals: Week 1:  OT Short Term Goal 1 (Week 1): Pt will complete sit > stand with max assist of one caregiver to progress towards standing for LB self-care tasks OT Short Term Goal 2 (Week 1): Pt will complete LB dressing with max assist of one caregiver OT Short Term Goal 3 (Week 1): Pt will complete toilet transfer with mod assist stand vs squat pivot OT Short Term Goal 4 (Week 1): Pt will complete bathing with mod assist  Skilled Therapeutic Interventions/Progress Updates:    Treatment session with focus on sit > stand and functional use of BUE.  Pt received upright in recliner complaining about her hair and requesting to wash hair again this session. Educated on purpose of therapy and goals with pt agreeable to incorporating BUE into self-care tasks.  Pt completed sit > stand in to [place] with min assist.  Transferred to w/c via [place].  Therapist washed hair with hair wash tray total assist.  Pt then able to utilize BUE with focus on RUE functional use to brush and blow dry hair.  Pt demonstrating increased functional use of RUE with ability to manipulate and coordinate use of hairbrush and hairdryer.  Pt transferred back to recliner via [place] with min assist for sit > stand into [place].  Pt remained reclined with legs elevated, chair alarm on, and all needs in reach.  Therapy Documentation Precautions:  Precautions Precautions: Fall Precaution Comments: R hemi, has MS with rigid LLE Restrictions Weight Bearing Restrictions: No General:   Vital Signs: Therapy Vitals Temp: 98.3 F (36.8 C) Temp Source: Oral Pulse Rate: 85 Resp: 17 BP: 130/71 Patient Position (if appropriate): Sitting Oxygen Therapy SpO2: 100 % O2 Device: Room Air Pain:  Pt with no  c/o pain   Therapy/Group: Individual Therapy  Simonne Come 12/17/2020, 3:25 PM

## 2020-12-17 NOTE — Progress Notes (Signed)
Speech Language Pathology Daily Session Note  Patient Details  Name: Carly [name] MRN: [id number] Date of Birth: 1950/11/25  Today's Date: 12/17/2020 SLP Individual Time: 5909-3112 SLP Individual Time Calculation (min): 42 min  Short Term Goals: Week 1: SLP Short Term Goal 1 (Week 1): Patient will perform mild complex problem solving tasks (medication management, financial management) with mod A. SLP Short Term Goal 2 (Week 1): Patient will demonstrate increased insight/awareness to deficits and errors during task production and attempt to correct with min A cues. SLP Short Term Goal 3 (Week 1): SLP with administer formal cognitive assessment SLP Short Term Goal 4 (Week 1): Pt will increase functional recall with immediate and short term delay provided mod A cues for use of compensatory strategies  Skilled Therapeutic Interventions:Skilled ST services focused on cognitive skills. Patient demonstrated impairments in delayed recall, unable to remember weekend ST evaluation without moderate verbal cues. Patient initially denied changes in cognition but was agreeable to short-term memory loss upon reflection. SLP facilitated basic to mildly-complex problem-solving with PEG design task. Patient demonstrated mod I for problem-solving and error awareness in basic designs and mildly complex design required min A verbal cues. However when alternating attention was challenged patient required mod A verbal cues for problem-solving, error awareness and recall within task. Pt was left in room with call bell within reach and bed alarm set. SLP recommends to continue skilled services.     Pain Pain Assessment Pain Scale: 0-10 Pain Score: 4  Pain Type: Chronic pain Pain Location: Neck Pain Orientation: Mid Pain Descriptors / Indicators: Aching Pain Frequency: Intermittent Pain Onset: Gradual Patients Stated Pain Goal: 2 Pain Intervention(s): Medication (See eMAR);Heat  applied;Repositioned  Therapy/Group: Individual Therapy  Alanea Woolridge  [facility] 12/17/2020, 9:52 AM

## 2020-12-17 NOTE — Progress Notes (Signed)
PROGRESS NOTE   Subjective/Complaints: No complaints this morning Very appreciative of therapy Now having regular BM  ROS: Had BM with suppository, denies pain  Objective:   No results found. Recent Labs    12/17/20 0420  WBC 6.8  HGB 13.4  HCT 39.9  PLT 283   Recent Labs    12/17/20 0420  NA 138  K 3.8  CL 105  CO2 21*  GLUCOSE 113*  BUN 9  CREATININE 0.70  CALCIUM 9.3    Intake/Output Summary (Last 24 hours) at 12/17/2020 1146 Last data filed at 12/17/2020 0734 Gross per 24 hour  Intake 680 ml  Output --  Net 680 ml        Physical Exam: Vital Signs Blood pressure 136/86, pulse 84, temperature 98 F (36.7 C), temperature source Oral, resp. rate 18, height 5\' 3"  (1.6 m), weight 78.4 kg, SpO2 97 %.  Gen: no distress, normal appearing HEENT: oral mucosa pink and moist, NCAT Cardio: Reg rate Chest: normal effort, normal rate of breathing Abd: soft, non-distended Ext: no edema Psych: pleasant, normal affect Skin: intact  Musculoskeletal:     Cervical back: Normal range of motion and neck supple.     Comments: RUE- Bicep, triceps, WE, 5-/5, grip 4+/5 and finger abd 4/5 LUE- biceps, triceps, and WE 5-/5, grip 4/5, and finger abd 4-/5 RLE- HF 4+/5, KE 4+/5, DF 4-/5, and PF 5-/5 LLE- HF 4+/5, KE 4/5, DF 2+/5, and PF 4/5 Has moderate L foot drop noted- from MS  Skin:    Comments: External hemorrhoids- stage II A few bruises on Arms B/L Backside no skin breakdown nor heels B/L   Neurological:     Comments: [employer]- and able to tell me next holiday- even day/date/month and year as well as location and president.  Intact to light touch in all 4 extremities MAS of 2-3 in LLE- no clonus- and maybe 1+-2 at L ankle, , but worse MAS (2-3) at L hip and knee. No spasticity seen on R side.    Psychiatric:     Comments: Appropriate, but had some slowed processing in answering questions, accessing memory.      Assessment/Plan: 1. Functional deficits which require 3+ hours per day of interdisciplinary therapy in a comprehensive inpatient rehab setting.  Physiatrist is providing close team supervision and 24 hour management of active medical problems listed below.  Physiatrist and rehab team continue to assess barriers to discharge/monitor patient progress toward functional and medical goals  Care Tool:  Bathing    Body parts bathed by patient: Right arm,Chest,Abdomen,Face   Body parts bathed by helper: Left arm,Front perineal area,Buttocks,Right upper leg,Left upper leg,Right lower leg,Left lower leg     Bathing assist Assist Level: Maximal Assistance - Patient 24 - 49%     Upper Body Dressing/Undressing Upper body dressing   What is the patient wearing?: Pull over shirt    Upper body assist Assist Level: Maximal Assistance - Patient 25 - 49%    Lower Body Dressing/Undressing Lower body dressing      What is the patient wearing?: Pants     Lower body assist Assist for lower body dressing: Dependent - Patient  0%     Toileting Toileting    Toileting assist Assist for toileting: Maximal Assistance - Patient 25 - 49%     Transfers Chair/bed transfer  Transfers assist     Chair/bed transfer assist level: Maximal Assistance - Patient 25 - 49%     Locomotion Ambulation   Ambulation assist   Ambulation activity did not occur: Safety/medical concerns          Walk 10 feet activity   Assist  Walk 10 feet activity did not occur: Safety/medical concerns        Walk 50 feet activity   Assist Walk 50 feet with 2 turns activity did not occur: Safety/medical concerns         Walk 150 feet activity   Assist Walk 150 feet activity did not occur: Safety/medical concerns         Walk 10 feet on uneven surface  activity   Assist Walk 10 feet on uneven surfaces activity did not occur: Safety/medical concerns          Wheelchair     Assist Will patient use wheelchair at discharge?: Yes (Per PT long term goals) Type of Wheelchair: Manual    Wheelchair assist level: Minimal Assistance - Patient > 75% Max wheelchair distance: 150    Wheelchair 50 feet with 2 turns activity    Assist        Assist Level: Minimal Assistance - Patient > 75%   Wheelchair 150 feet activity     Assist      Assist Level: Minimal Assistance - Patient > 75%   Blood pressure 136/86, pulse 84, temperature 98 F (36.7 C), temperature source Oral, resp. rate 18, height 5\' 3"  (1.6 m), weight 78.4 kg, SpO2 97 %.  Medical Problem List and Plan: 1.  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  Antithrombotics: -DVT/anticoagulation:  Pharmaceutical: Continue Lovenox- may need to continue at facility. Educated husband regarding performing injections.              -antiplatelet therapy: Plavix  3. Chronic LBP/Pain Management: Tylenol daily 4. Mood: Team to provide ego support.              -antipsychotic agents: N/A 5. Neuropsych: This patient is not fully capable of making decisions on her own behalf. 6. Skin/Wound Care: Routine pressure relief measures.  7. Fluids/Electrolytes/Nutrition: Monitor I/O. Electrolytes reviewed and are stable. Repeat Monday 8. H/o depression w/GAD: Continue Ativan 0.25 mg/hs, Cymbalta 60 mg and Remeron 45 mg/hs 9. H/o MS: on no disease modifying agents; Continue Ampyra daily. Has some spasticity and L foot drop- might benefit from spasticity treatment, has L AFO  10. Dyslipidemia: Chol 243/LDL-143             --Continue Lipitor daily 11. Constipation- improved with prunes and popcorn, suppository ordered prn.  12, Cognitive decline: SLP ordered- provided with a research study  regarding benefits of anti-inflammatory diet for cognition, provided counseling to patient and family regarding optimal nutrition to enhance cognition.  81.  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 Obesity (BMI 30.62): have provided dietary  education to patient, her husband, and her daughter-in-law  36. Disposition: Husband prefers discharge to [facility] or [facility]- discussed with Jacqlyn Larsen and she will apply, discuss likelihood of admission with husband. If she is unable to get admission, husband can increase days of home health aide to 5 days per week. He is getting burned out by caring for her. Does have three sons and a very supportive daughter-in-law. Would like aide to be involved in caregiver training toward the end of stay.   LOS: 4 days A FACE TO FACE EVALUATION WAS PERFORMED  Clide Deutscher Mayar Whittier 12/17/2020, 11:46 AM

## 2020-12-17 NOTE — Progress Notes (Signed)
Physical Therapy Session Note  Patient Details  Name: Carly [name] MRN: [id number] Date of Birth: 06-Jun-1951  Today's Date: 12/17/2020 PT Individual Time: 0998-3382 and 1107-1200  PT Individual Time Calculation (min): 28 min and 53 min  Short Term Goals: Week 1:  PT Short Term Goal 1 (Week 1): Pt will perform bed mobility with min assist ang use of bed features as needed PT Short Term Goal 2 (Week 1): Pt will performed stand pivot transfer to [facility] with mod assist consistently PT Short Term Goal 3 (Week 1): Pt wil ambulate 66ft with mod assist and LRAD PT Short Term Goal 4 (Week 1): Pt will propell WC 159ft with supervision assist  Skilled Therapeutic Interventions/Progress Updates:   Treatment Session 1: [phone] 28 min Received pt supine in bed, pt agreeable to therapy, and reported neck pain 5/10. RN notified and planning to administer medication when able. Session with emphasis on dressing, functional mobility/transfers, generalized strengthening, dynamic standing balance/coordination, and improved activity tolerance. Pt requested to don her own personal brief and pants and doffed hospital brief with total A but pt able to bridge hips to assist. Donned pt's brief and pants with max A as pt with difficulty lifting LLE to thread through pant hole. Pt rolled L and R with supervision and required max A to pull brief and pants over hips. Pt transferred supine<>sitting EOB with mod A and then reported her brief felt "wrong" and requested to fix it. Sit<>supine with mod A and doffed pants and brief with total A for time management and re-applied brief and pants in same manner listed above. MD present for morning rounds. Supine<>sit again with mod A and pt with posterior and L lateral lean requring mod A for static sitting balance. Donned compression socks and shoes with total A. Attempted sit<>stand with RW but pt unable to stand due to poor trunk control and difficulty motor planning; ultimately  requesting use of Stedy. Sit<>stand in stedy with min A and dependent transfer to recliner. Concluded session with pt sitting in recliner, needs within reach, and chair pad alarm on.   Treatment Session 2: 1107-1200 53 min Received pt sitting on commode with Stedy. Pt able to void minimally and perform peri-care with supervision and cues for motor planning. Pt transferred sit<>stand in [place] with min A and required mod A to pull brief/pants over hips. Dependent transfer to [facility] in [place]. Doffed shirt with max A (although encouraged pt to do as much as possible but pt became frustrated). Applied deodorant and lotion with supervision. Pt requested to wash hair and therapist provided pt with shower cap and assisted in applying shower cap. Pt able to wash hair in shower cap and comb hair with supervision. Provided pt with hair dryer and pt dried 1/2 of hair and then requested that therapist finish due to fatigue. Donned clean pull over shirt with min A and pt reported urge to urinate again and transferred sit<>stand in [place] with min A and transported pt to bedside commode dependently. Pt reported urinating however no urine seen in toilet. Clothing management assist as mentioned above. Dependent transfer to recliner. Concluded session with pt sitting in recliner, needs within reach, and chair pad alarm on. Of note, pt requires increased time with ADLs as pt is particular with her routine.   Therapy Documentation Precautions:  Precautions Precautions: Fall Precaution Comments: R hemi, has MS with rigid LLE Restrictions Weight Bearing Restrictions: No  Therapy/Group: Individual Therapy [place] PT,  DPT   12/17/2020, 7:37 AM

## 2020-12-18 MED ORDER — QUETIAPINE FUMARATE 25 MG PO TABS
25.0000 mg | ORAL_TABLET | Freq: Every day | ORAL | Status: DC
  Administered 2020-12-18 – 2020-12-19 (×2): 25 mg via ORAL
  Filled 2020-12-18 (×2): qty 1

## 2020-12-18 NOTE — Progress Notes (Signed)
Speech Language Pathology Daily Session Note  Patient Details  Name: Carly [name] MRN: [id number] Date of Birth: [birth year]  Today's Date: 12/18/2020 SLP Individual Time: 0850-0930 SLP Individual Time Calculation (min): 40 min  Short Term Goals: Week 1: SLP Short Term Goal 1 (Week 1): Patient will perform mild complex problem solving tasks (medication management, financial management) with mod A. SLP Short Term Goal 2 (Week 1): Patient will demonstrate increased insight/awareness to deficits and errors during task production and attempt to correct with min A cues. SLP Short Term Goal 3 (Week 1): SLP with administer formal cognitive assessment SLP Short Term Goal 4 (Week 1): Pt will increase functional recall with immediate and short term delay provided mod A cues for use of compensatory strategies  Skilled Therapeutic Interventions:Skilled ST services focused on cognitive skills. Pt was very upset and anxious upon entering room due to "lossing" her retainer. SLP located it in her makeup case, demonstrated good basic problem solving , however due to recall deficits was unable to locate it independently. SLP educated pt in use of visualization and association strategies for short term recall. Pt was able to recall locate of retainer throughout session in 5-10 minute intervals mod I. Pt requested to use bathroom, SLP assisted with stedy min A. Pt expressed difficulty navigating cell phone and forgot husbands phone number and how to get rid of notifications. SLP added area code to husband's number and pt returned demonstration of "chunking" strategy to recall numbers with min A verbal cues. Pt required mod A verbal cues for problem solving and recall when navigating phone to remove notifications in messages, voicemail and facebook chat. SLP adjusted problem solving goals to reflect baseline ability to mildly complex. Pt was left in room with call bell within reach and chair alarm set. SLP recommends  to continue skilled services.      Pain Pain Assessment Pain Scale: 0-10 Pain Score: 0-No pain  Therapy/Group: Individual Therapy  [place]  [facility] 12/18/2020, 9:47 AM

## 2020-12-18 NOTE — Progress Notes (Signed)
Occupational Therapy Session Note  Patient Details  Name: Carly [name] MRN: [id number] Date of Birth: [birth year]  Today's Date: 12/18/2020 OT Individual Time: 1000-1115 OT Individual Time Calculation (min): 75 min    Short Term Goals: Week 1:  OT Short Term Goal 1 (Week 1): Pt will complete sit > stand with max assist of one caregiver to progress towards standing for LB self-care tasks OT Short Term Goal 2 (Week 1): Pt will complete LB dressing with max assist of one caregiver OT Short Term Goal 3 (Week 1): Pt will complete toilet transfer with mod assist stand vs squat pivot OT Short Term Goal 4 (Week 1): Pt will complete bathing with mod assist  Skilled Therapeutic Interventions/Progress Updates:    Treatment session with focus on self-care retraining, sit > stand, and functional use of RUE during self-care tasks of bathing, dressing, oral care and simulated self-feeding.  Pt received in recliner with legs elevated.  Pt agreeable to therapy session.  Engaged in [place] bathing seated upright in recliner with pillow behind back for improved upright posture.  Pt able to utilize RUE with improved mobility and sustained grasp when managing wash cloth and clothing.  Pt required mod assist to pull shirt over head due to decreased sequencing.  Engaged in sit > stand x3 with mod assist overall and requiring max assist for one attempt. Therapist providing max cues for sequencing of weight shift and hand placement with sit > stand.  Therapist completed LB dressing after pt attempted to thread RLE and quickly becoming overwhelmed.  Engaged in oral care in partial standing at sink in [place].  Pt able to stand in [place] with min assist with UE support.  Therapist encouraged use of RUE with oral care to increase functional use, pt able to complete with RUE 50% of oral care.  Returned to Psychologist, occupational via [employer].  Engaged in simulated self-feeding with pt bringing hand to nose and mouth, encouraged pt to continue  activity during down time even writing it on her notepad.  Therapist introduced use of mirror to provide increased visual feedback with self-feeding with pt demonstrating improved success with bringing graham cracker to mouth with visual feedback.  Pt frequently bringing mouth and tongue to hand instead of hand to mouth.  Therapist plans to work with pt on self-feeding during breakfast tomorrow to continue to address.  Therapy Documentation Precautions:  Precautions Precautions: Fall Precaution Comments: R hemi, has MS with rigid LLE Restrictions Weight Bearing Restrictions: No Pain: Pain Assessment Pain Scale: 0-10 Pain Score: 0-No pain   Therapy/Group: Individual Therapy  Simonne Come 12/18/2020, 12:30 PM

## 2020-12-18 NOTE — Patient Care Conference (Signed)
Inpatient RehabilitationTeam Conference and Plan of Care Update Date: 12/18/2020   Time: 9:39 AM    Patient Name: Carly [name]      Medical Record Number: [id number]  Date of Birth: August 10, 1951 Sex: Female         Room/Bed: 5C07C/5C07C-01 Payor Info: Payor: MEDICARE / Plan: MEDICARE PART A AND B / Product Type: *No Product type* /    Admit Date/Time:  12/13/2020 12:59 PM  Primary Diagnosis:  White matter periventricular infarction [facility])  Hospital Problems: Principal Problem:   White matter periventricular infarction [facility])    Expected Discharge Date: Expected Discharge Date: 01/05/21  Team Members Present: Physician leading conference: Dr. Leeroy Cha Care Coodinator Present: Dorthula Nettles, RN, BSN, CRRN;Becky Dupree, LCSW Nurse Present: Other (comment) Jarvis Morgan, RN) PT Present: Becky Sax, PT OT Present: Simonne Come, OT PPS Coordinator present : Gunnar Fusi, SLP     Current Status/Progress Goal Weekly Team Focus  Bowel/Bladder   continent b/b  remain continent b/b      Swallow/Nutrition/ Hydration             ADL's   Min A transfers with use of STEDY, Max - total to attempt sit > stand with RW with increased R lean and posterior pushing, total assist LB dsg, Min-mod assist UB dressing.  Pt very anxious with all mobility  Min assist bathing and dressing, min assist transfers, mod assist toileting tasks, Supervision grooming and feeding  ADl retraining, sit > stand, transfers, functional use of BUE, self-feeding   Mobility   bed mobility mod A, sit<>stand with RW mod A, stand<>pivot transfers max A, gait 56ft with RW mod A  min A, supervision WC mobility  functional mobility/transfers, safety awareness, generalized strengthening, dynamic standing balance/coordination, ambulation, and endurance   Communication             Safety/Cognition/ Behavioral Observations  Mod A  Min-Supervision A  delayed recall, deficit awareness/error awareness, mildly complex  problem solving and sustained attention   Pain   minimal complaints of pain  <3 on a 0-10 pain scale  assess pain q 4 hr and prn   Skin   CDI  no new breakdown while on rehab  assess skin q shift and prn     Discharge Planning:  Short term plan is home with additional hired assist and long term plan is to go either to [facility] or [place]. Husband is looking into both facilities. Pt is more agreement now with plan   Team Discussion: Received AFO, ordered K-pad for pain 4/10, will express to MD but not to nursing. 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 Continent B/B with LBM today. Patient to discharge home with husband and additional hired help for the short term. Possible long term to a SNF. Patient on target to meet rehab goals: Mod assist with bed mobility, mod/max assist with gait. Min assist goals, Supervision at [facility] level. Fatigues quickly, she is a pusher. Using a stedy. Mod assist for bathing and dressing. Min assist and mod assist goals. Supervision goals to feed herself. Working on mildly complex problem solving, delayed recall, and sustained attention.  *See Care Plan and progress notes for long and short-term goals.   Revisions to Treatment Plan:  Discussed with patient and husband to try meditation and calming music for anxiety and pseudobulbar affect for now because she doesn't want to use medication.  Teaching Needs: Family education, medication management, skin/wound  care, transfer training, gait training, endurance training, balance training, pain management, anxiety management.  Current Barriers to Discharge: Inaccessible home environment, Decreased caregiver support, Home enviroment access/layout, Lack of/limited family support, Medication compliance and Behavior  Possible Resolutions to Barriers: Continue current medications, provide emotional support.     Medical Summary Current Status: pseudobulbar affect,  constipation, left sided foot drop, HTN  Barriers to Discharge: Medical stability;Behavior  Barriers to Discharge Comments: pseudobulbar affect, constipation, left sided foot drop, HTN Possible Resolutions to [employer] Focus: Discussed medication for pseudobulbar affect- she defers at this time, will also discuss with her husband and try meditation, calming music for now, left AFO, continue to monitor BP TID   Continued Need for Acute Rehabilitation Level of Care: The patient requires daily medical management by a physician with specialized training in physical medicine and rehabilitation for the following reasons: Direction of a multidisciplinary physical rehabilitation program to maximize functional independence : Yes Medical management of patient stability for increased activity during participation in an intensive rehabilitation regime.: Yes Analysis of laboratory values and/or radiology reports with any subsequent need for medication adjustment and/or medical intervention. : Yes   I attest that I was present, lead the team conference, and concur with the assessment and plan of the team.   Cristi Loron 12/18/2020, 12:46 PM

## 2020-12-18 NOTE — Progress Notes (Signed)
PROGRESS NOTE   Subjective/Complaints: Has memory deficits Anxiety, pseudobulbar affect Min to S goals- speech is starting a Social worker Had lengthy discussion with husband about her condition at home  ROS: Had BM with suppository, +right sided neck pain, chronic, +anxiety as per husband and nursing  Objective:   No results found. Recent Labs    12/17/20 0420  WBC 6.8  HGB 13.4  HCT 39.9  PLT 283   Recent Labs    12/17/20 0420  NA 138  K 3.8  CL 105  CO2 21*  GLUCOSE 113*  BUN 9  CREATININE 0.70  CALCIUM 9.3    Intake/Output Summary (Last 24 hours) at 12/18/2020 0855 Last data filed at 12/18/2020 0721 Gross per 24 hour  Intake 960 ml  Output --  Net 960 ml        Physical Exam: Vital Signs Blood pressure 134/79, pulse 87, temperature 98 F (36.7 C), temperature source Oral, resp. rate 17, height 5\' 3"  (1.6 m), weight 78.4 kg, SpO2 97 %. Gen: no distress, normal appearing HEENT: oral mucosa pink and moist, NCAT Cardio: Reg rate Chest: normal effort, normal rate of breathing Abd: soft, non-distended Ext: no edema Psych: pleasant, normal affect Skin: intact Musculoskeletal:     Cervical back: Normal range of motion and neck supple.     Comments: RUE- Bicep, triceps, WE, 5-/5, grip 4+/5 and finger abd 4/5 LUE- biceps, triceps, and WE 5-/5, grip 4/5, and finger abd 4-/5 RLE- HF 4+/5, KE 4+/5, DF 4-/5, and PF 5-/5 LLE- HF 4+/5, KE 4/5, DF 2+/5, and PF 4/5 Has moderate L foot drop noted- from MS  Skin:    Comments: External hemorrhoids- stage II A few bruises on Arms B/L Backside no skin breakdown nor heels B/L   Neurological:     Comments: Ox[employer]- and able to tell me next holiday- even day/date/month and year as well as location and president.  Intact to light touch in all 4 extremities MAS of 2-3 in LLE- no clonus- and maybe 1+-2 at L ankle, , but worse MAS (2-3) at L hip and knee. No spasticity  seen on R side.    Psychiatric:     Comments: Appropriate, but had some slowed processing in answering questions, accessing memory.     Assessment/Plan: 1. Functional deficits which require 3+ hours per day of interdisciplinary therapy in a comprehensive inpatient rehab setting.  Physiatrist is providing close team supervision and 24 hour management of active medical problems listed below.  Physiatrist and rehab team continue to assess barriers to discharge/monitor patient progress toward functional and medical goals  Care Tool:  Bathing    Body parts bathed by patient: Right arm,Chest,Abdomen,Face   Body parts bathed by helper: Left arm,Front perineal area,Buttocks,Right upper leg,Left upper leg,Right lower leg,Left lower leg     Bathing assist Assist Level: Maximal Assistance - Patient 24 - 49%     Upper Body Dressing/Undressing Upper body dressing   What is the patient wearing?: Pull over shirt    Upper body assist Assist Level: Maximal Assistance - Patient 25 - 49%    Lower Body Dressing/Undressing Lower body dressing  What is the patient wearing?: Pants     Lower body assist Assist for lower body dressing: Dependent - Patient 0%     Toileting Toileting    Toileting assist Assist for toileting: Moderate Assistance - Patient 50 - 74%     Transfers Chair/bed transfer  Transfers assist     Chair/bed transfer assist level: Moderate Assistance - Patient 50 - 74%     Locomotion Ambulation   Ambulation assist   Ambulation activity did not occur: Safety/medical concerns  Assist level: Maximal Assistance - Patient 25 - 49% Assistive device: Walker-rolling (& L AFO) Max distance: 25   Walk 10 feet activity   Assist  Walk 10 feet activity did not occur: Safety/medical concerns  Assist level: Maximal Assistance - Patient 25 - 49% Assistive device: Walker-rolling (& L AFO)   Walk 50 feet activity   Assist Walk 50 feet with 2 turns activity  did not occur: Safety/medical concerns         Walk 150 feet activity   Assist Walk 150 feet activity did not occur: Safety/medical concerns         Walk 10 feet on uneven surface  activity   Assist Walk 10 feet on uneven surfaces activity did not occur: Safety/medical concerns         Wheelchair     Assist Will patient use wheelchair at discharge?: Yes (Per PT long term goals) Type of Wheelchair: Manual    Wheelchair assist level: Minimal Assistance - Patient > 75% Max wheelchair distance: 150    Wheelchair 50 feet with 2 turns activity    Assist        Assist Level: Minimal Assistance - Patient > 75%   Wheelchair 150 feet activity     Assist      Assist Level: Minimal Assistance - Patient > 75%   Blood pressure 134/79, pulse 87, temperature 98 F (36.7 C), temperature source Oral, resp. rate 17, height 5\' 3"  (1.6 m), weight 78.4 kg, SpO2 97 %.  Medical Problem List and Plan: 1.  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  Antithrombotics: -DVT/anticoagulation:  Pharmaceutical: Continue Lovenox- may need to continue at facility. Educated husband regarding performing injections.              -antiplatelet therapy: Plavix  3. Chronic LBP/Pain Management: Tylenol daily, kpad ordered for right neck to relax the muscles.  4. Mood: Team to provide ego support.              -antipsychotic agents: N/A 5. Neuropsych: This patient is not fully capable of making decisions on her own behalf. 6. Skin/Wound Care: Routine pressure relief measures.  7. Fluids/Electrolytes/Nutrition: Monitor I/O. Electrolytes reviewed and are stable. Repeat Monday 8. H/o depression w/GAD: Continue Ativan 0.25 mg/hs, Cymbalta 60 mg  and Remeron 45 mg/hs. Add Seroquel 25mg  daily.  9. H/o MS: on no disease modifying agents; Continue Ampyra daily. Has some spasticity and L foot drop- might benefit from spasticity treatment, has L AFO  10. Dyslipidemia: Chol 243/LDL-143             --Continue Lipitor daily 11.  Constipation- improved with prunes and popcorn, suppository ordered prn.  12, Cognitive decline: SLP ordered- provided with a research study regarding benefits of anti-inflammatory diet for cognition, provided counseling to patient and family regarding optimal nutrition to enhance cognition.  7.  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 Obesity (BMI 30.62): have provided dietary education to patient, her husband, and her daughter-in-law  58. Disposition: Discussed goals with husband: he was providing her significant cognitive and physical support at home- helping her get to and from bathroom. Patient states she was wiping herself. Discussed d/c date of 3/17 with goals of improved ambulation in home with as little support from husband as possible- he will hire additional days of health aide (3 to 5, potentially overnight as well)., and improved right arm function. Long term he is looking for facility for them both to reside.  >35 minutes spent in discussion with patient regarding her disposition, anxiety, discussion with nursing regarding patient's anxiety, discussing with husband and care team regarding patient's anxiety, disposition, and goals, and in discussion with husband regarding her performance on SLP cognitive tasks. >50% of time spent in care and coordination of care  LOS: 5 days A FACE TO FACE EVALUATION WAS [place] 12/18/2020, 8:55 AM

## 2020-12-18 NOTE — Progress Notes (Signed)
Patient ID: Carly [name], female   DOB: [birth year], 70 y.o.   MRN: [id number]  Met with pt to discuss team conference goals min assist level and discharge date will be set once MD talks with husband regarding goals and questions today. Husband to visit [place] this week and see availability and cost. The plan will be for pt to go home with additional care via caregiver and then eventually move into to one of the senior living with husband for the longer term/future. Pt feels like she is doing more now than at home prior to stroke. She is motivated and glad to be here. Feels like this is her place God has placed her and she needs to make the most of it. Will touch base once MD talks with husband today to answer questions.

## 2020-12-18 NOTE — Progress Notes (Signed)
Physical Therapy Session Note  Patient Details  Name: Carly [name] MRN: [id number] Date of Birth: 01-Oct-1951  Today's Date: 12/18/2020 PT Individual Time: 1130-1156 and 1400-1438  PT Individual Time Calculation (min): 26 min and 38 min PT Missed Time: 22 minutes PT Missed Time Reason: Fatigue and pain  Short Term Goals: Week 1:  PT Short Term Goal 1 (Week 1): Pt will perform bed mobility with min assist ang use of bed features as needed PT Short Term Goal 2 (Week 1): Pt will performed stand pivot transfer to [facility] with mod assist consistently PT Short Term Goal 3 (Week 1): Pt wil ambulate 73ft with mod assist and LRAD PT Short Term Goal 4 (Week 1): Pt will propell WC 128ft with supervision assist  Skilled Therapeutic Interventions/Progress Updates:   Treatment Session 1: [id number] 26 min Received pt sitting in recliner, pt agreeable to therapy, and reported neck pain 5/10 and reported feeling fatigued from previous therapies. RN notified of pain level. Session with emphasis on functional mobility/transfers, generalized strengthening, dynamic standing balance/coordination, toileting, and improved activity tolerance. Planned to work on standing balance with RW but pt reported sudden urge to urinate. Sit<>stand in [place] with min A and dependent toilet transfer to bedside commode. Pt able to manage clothing with min A and required min cues for sequencing when sitting on commode. Pt able to void but with increased difficulty sequencing and motor planning when performing peri-care alternating trying to perform while sitting and standing. Sit<>stand with min A and max A to pull pants over hips. Transported to sink in [place] and pt washed hands with supervision. Pt frustrated knocking over items on sink and frequently leaning over Stedy bars reporting increased fatigue. Returned to recliner for extended rest break. Pt required 3 attempts and heavy mod/max A to stand with RW with max cues for sequencing  and hand placement on RW and recliner armrest. In standing pt with downward gaze and bilateral knee flexion/knee valgus requiring mod cues for upright posture/gaze, trunk extension, and knee extension. Pt stated "I'm not doing well, I'm just so tired" and returned to sitting in recliner. Concluded session with pt sitting in recliner, needs within reach, and chair pad alarm on.   Treatment Session 2: 3382-5053 38 min Received pt sitting in recliner, pt agreeable to therapy, and reported pain 6/10 in neck (premedicated) and reported increased low back pain with OOB mobility. Pt applied Licocane roll on to neck and repositioning and rest breaks done to reduce pain levels. Session with emphasis on functional mobility/transfers, generalized strengthening, dynamic standing balance/coordination, NMR, and improved activity tolerance. Pt transferred sit<>stand in [place] with CGA and transferred to [facility] dependently. Encouraged pt to put on her own mask with emphasis on problem solving and pt required increased time and encouragement but able to don mask with supervision and verbal cues. Pt transported downstairs to 4W dayroom in [facility] total A for time management purposes and donned L AFO with total A. Pt transferred sit<>stand with RW and mod A with therapist stabilizing RW as pt prefers to pull up on RW with both UEs. Pt performed x 2 stand<>pivot transfers throughout session with RW and max A. Pt required max cues for stepping sequencing, foot placement, and RW management as pt with increased difficulty motor planning and sequencing when turning and attempting to abandon RW and swing hips onto seat. Attempted to perform RLE toe taps to 1in step however pt with increased difficulty standing, requring multiple standing attempts to achieve upright position  and with heavy mod A. Pt demonstrated bilateral knee valgus and knee flexion in stance with mild posterior lean and trunk flexion requiring cues for correction. Pt because  frustrated attempting to stand and reported increased low back pain and became emotional stating that if she was able to rest earlier she could do better. Therapist provided emotional support and encouragement to calm pt down but pt ultimately requested to return to room to rest. Pt transported back to room in [facility] total A and requested to return to bed. Squat<>pivot WC<>bed with mod A and doffed shoes with total A. Sit<>supine with mod A for LE management as pt stated "I just have no energy". Scooted to middle of bed with supervision and mod cues. Concluded session with pt supine in bed, needs within reach, and bed alarm on. Therapist provided pt with moist heat pack for back pain. 22 minutes missed of skilled physical therapy due to fatigue and neck/back pain.   Therapy Documentation Precautions:  Precautions Precautions: Fall Precaution Comments: R hemi, has MS with rigid LLE Restrictions Weight Bearing Restrictions: No  Therapy/Group: Individual Therapy Alfonse Alpers PT, DPT   12/18/2020, 7:24 AM

## 2020-12-18 NOTE — Plan of Care (Signed)
  Problem: RH Problem Solving Goal: LTG Patient will demonstrate problem solving for (SLP) Description: LTG:  Patient will demonstrate problem solving for basic/complex daily situations with cues  (SLP) Flowsheets Taken 12/18/2020 0945 by Charolett Bumpers, CCC-SLP LTG: Patient will demonstrate problem solving for (SLP): (basic to mildly complex) Other (comment) Taken 12/15/2020 1019 by Lillie Columbia R, CCC-SLP LTG Patient will demonstrate problem solving for: Minimal Assistance - Patient > 75% Note: Adjusted to reflect baseline abilities

## 2020-12-19 MED ORDER — QUETIAPINE FUMARATE 25 MG PO TABS
12.5000 mg | ORAL_TABLET | Freq: Every day | ORAL | Status: DC
  Administered 2020-12-20 – 2020-12-27 (×8): 12.5 mg via ORAL
  Filled 2020-12-19 (×8): qty 1

## 2020-12-19 NOTE — Progress Notes (Signed)
Physical Therapy Session Note  Patient Details  Name: Carly [name] MRN: [id number] Date of Birth: 06-27-1951  Today's Date: 12/19/2020 PT Individual Time: 0900-0954 PT Individual Time Calculation (min): 54 min   Short Term Goals: Week 1:  PT Short Term Goal 1 (Week 1): Pt will perform bed mobility with min assist ang use of bed features as needed PT Short Term Goal 2 (Week 1): Pt will performed stand pivot transfer to [name] with mod assist consistently PT Short Term Goal 3 (Week 1): Pt wil ambulate 101ft with mod assist and LRAD PT Short Term Goal 4 (Week 1): Pt will propell WC 149ft with supervision assist  Skilled Therapeutic Interventions/Progress Updates:     Chart reviewed. Pt agreeable to therapy. Pt was received by PT seated on toilet with not c/o pain.  Pt transferred to chair with CGA + STEDY for sit<>stand and supervision for balance during transfer.  PT then assisted pt with donning of compression stocking for edema management and L AFO.  Pt completed SPT from WC<> mat table with MaxA x1 + RW and frequent VCs for sequencing during session.  Pt completed dynamic standing balance therex including weight shifting with BUE support on RW and PEG board placement with RUE support.  Pt continued PEG board placement as dynamic sitting balance therex with supervision for seated balance and frequent VCs to slow movement for management of apraxia.   Pt completed shoulder rolls, cervical stretches in seated position with supervision for balance.  Pt returned to room and left seated on toilet with CGA + STEDY for transfer, safety cord in reach and RN notified of pt location.   Therapy Documentation Precautions:  Precautions Precautions: Fall Precaution Comments: R hemi, has MS with rigid LLE Restrictions Weight Bearing Restrictions: No   Therapy/Group: Individual Therapy Becky Sax PT, DPT  Marquette Saa PT, DPT 12/19/2020, 11:27 AM

## 2020-12-19 NOTE — Progress Notes (Signed)
Speech Language Pathology Daily Session Note  Patient Details  Name: Carly [name] MRN: [id number] Date of Birth: 05-22-1951  Today's Date: 12/19/2020 SLP Individual Time: 0824-0900 SLP Individual Time Calculation (min): 36 min  Short Term Goals: Week 1: SLP Short Term Goal 1 (Week 1): Patient will perform mild complex problem solving tasks (medication management, financial management) with mod A. SLP Short Term Goal 2 (Week 1): Patient will demonstrate increased insight/awareness to deficits and errors during task production and attempt to correct with min A cues. SLP Short Term Goal 3 (Week 1): SLP with administer formal cognitive assessment SLP Short Term Goal 4 (Week 1): Pt will increase functional recall with immediate and short term delay provided mod A cues for use of compensatory strategies  Skilled Therapeutic Interventions: Skilled SLP intervention focused on cognition. Pt completed mildly complex time calculation task worksheet with min A visual cues. She expressed concern with handwriting and stated it is a personal goal to improve handwriting. She was tasked to write times on paper as practice during time calculation worksheet. She immediately became tearful and expressed frustration remainder of session regarding decline in handwriting. Educated patient on process with rehab and encouraged her to to be patient with her body during rehab process. Reasoned with patient regarding time spent in rehab and goals. She demonstrated good recall of OT's name and was instructed to ask OT about question regarding handwriting in afternoon session. Question was written on paper to increase recall.  Cont therapy per plan of care.       Pain Pain Assessment Pain Scale: Faces Faces Pain Scale: No hurt  Therapy/Group: Individual Therapy  Gregary Signs A Deija Buhrman 12/19/2020, 9:00 AM

## 2020-12-19 NOTE — Progress Notes (Signed)
Occupational Therapy Session Note  Patient Details  Name: Carly [name] MRN: [id number] Date of Birth: 1951-01-30  Today's Date: 12/19/2020 OT Individual Time: 4818-5631 and 4970-2637 OT Individual Time Calculation (min): 45 min and 53 min   Short Term Goals: Week 1:  OT Short Term Goal 1 (Week 1): Pt will complete sit > stand with max assist of one caregiver to progress towards standing for LB self-care tasks OT Short Term Goal 2 (Week 1): Pt will complete LB dressing with max assist of one caregiver OT Short Term Goal 3 (Week 1): Pt will complete toilet transfer with mod assist stand vs squat pivot OT Short Term Goal 4 (Week 1): Pt will complete bathing with mod assist  Skilled Therapeutic Interventions/Progress Updates:    1) Treatment session with focus on functional use of RUE in context of self-feeding.  Pt and husband concerned about pt ability to feed self.  Engaged in breakfast with focus on decreasing distractions, improved sitting posture, and functional use of RUE.  Pt initially feeding self with RUE with spoon demonstrating loose gross grasp.  Therapist applied built up handle to spoon with pt demonstrating improved grasp and manipulation of spoon.  Pt spilling ~10% of spoon fulls.  Educated on decreased portion size and focused attention to task to increase success.  Attempted use of mirror for visual feedback with pt then distracted by self in mirror decreasing effectiveness of mirror, therefore removed.  Discussed various finger foods to allow for improved success with self-feeding as looser foods such as oatmeal, soups, and cereals are more likely to spill.  Pt pleased with successes this session, however reports still not ready to "go out to eat".  Pt remained upright in recliner, returning legs to elevated and chair alarm on and all needs in reach.  2) Treatment session with focus on RUE NMR and functional transfers.  Pt received upright in recliner reporting desire to focus  on handwriting.  Pt reports attempting during SLP session and able to recall desire to address during this session.  Pt attempted to write name, however due to decreased wrist mobility and grip pt unable to make any legible writing.  Discussed recommendation to focus primarily on ROM and strengthening prior to returning to putting pen to paper.  Utilized theraputty with focus on tip to tip, 3 jaw chuck, and finger flexion when manipulating putty.  Therapist providing mod cues to utilize RUE as pt continuing to return to use of LUE when tasks got difficult.  Transitioned to picking up small stones and blocks with focus on wrist mobility when picking up items and placing them in cup.  Encouraged pt to complete putty and block stacking tasks 1x/day.  Therapist provided pt with handout for putty exercises.  Pt reports need to toilet.  Completed sit> stand in [place] with min assist and transferred to toilet via Stedy.  Pt provided total assist for toileting hygiene/clothing management.  Pt returned to recliner and positioned BLE elevated for edema management and left pt with all needs in reach and chair alarm on.  Therapy Documentation Precautions:  Precautions Precautions: Fall Precaution Comments: R hemi, has MS with rigid LLE Restrictions Weight Bearing Restrictions: No General:   Vital Signs: Therapy Vitals Temp: 98.2 F (36.8 C) Temp Source: Oral Pulse Rate: 81 Resp: 16 BP: 118/73 Patient Position (if appropriate): Lying Oxygen Therapy SpO2: 99 % O2 Device: Room Air Pain:  1)Pt with no c/o pain  2) Pt with c/o pain in neck.  RN notified.  Therapy/Group: Individual Therapy  Simonne Come 12/19/2020, 8:39 AM

## 2020-12-19 NOTE — Progress Notes (Signed)
PROGRESS NOTE   Subjective/Complaints: No complaints this morning After taking Seroquel, she did feel this sedated her and requested a lower dose.   ROS: Had BM with suppository, +right sided neck pain, chronic, +anxiety as per husband and nursing, +sedation from seroquel  Objective:   No results found. Recent Labs    12/17/20 0420  WBC 6.8  HGB 13.4  HCT 39.9  PLT 283   Recent Labs    12/17/20 0420  NA 138  K 3.8  CL 105  CO2 21*  GLUCOSE 113*  BUN 9  CREATININE 0.70  CALCIUM 9.3    Intake/Output Summary (Last 24 hours) at 12/19/2020 1623 Last data filed at 12/19/2020 1300 Gross per 24 hour  Intake 580 ml  Output --  Net 580 ml        Physical Exam: Vital Signs Blood pressure 138/76, pulse 86, temperature 98.9 F (37.2 C), temperature source Oral, resp. rate 16, height 5\' 3"  (1.6 m), weight 78.4 kg, SpO2 95 %. Gen: no distress, normal appearing HEENT: oral mucosa pink and moist, NCAT Cardio: Reg rate Chest: normal effort, normal rate of breathing Abd: soft, non-distended Ext: no edema Psych: pleasant, normal affect Skin: intact Musculoskeletal:     Cervical back: Normal range of motion and neck supple.     Comments: RUE- Bicep, triceps, WE, 5-/5, grip 4+/5 and finger abd 4/5 LUE- biceps, triceps, and WE 5-/5, grip 4/5, and finger abd 4-/5 RLE- HF 4+/5, KE 4+/5, DF 4-/5, and PF 5-/5 LLE- HF 4+/5, KE 4/5, DF 2+/5, and PF 4/5 Has moderate L foot drop noted- from MS  Skin:    Comments: External hemorrhoids- stage II A few bruises on Arms B/L Backside no skin breakdown nor heels B/L   Neurological:     Comments: Ox3- and able to tell me next holiday- even day/date/month and year as well as location and president.  Intact to light touch in all 4 extremities MAS of 2-3 in LLE- no clonus- and maybe 1+-2 at L ankle, , but worse MAS (2-3) at L hip and knee. No spasticity seen on R side.    Psychiatric:      Comments: Appropriate, but had some slowed processing in answering questions, accessing memory.     Assessment/Plan: 1. Functional deficits which require 3+ hours per day of interdisciplinary therapy in a comprehensive inpatient rehab setting.  Physiatrist is providing close team supervision and 24 hour management of active medical problems listed below.  Physiatrist and rehab team continue to assess barriers to discharge/monitor patient progress toward functional and medical goals  Care Tool:  Bathing    Body parts bathed by patient: Right arm,Chest,Abdomen,Face,Left arm   Body parts bathed by helper: Left arm,Front perineal area,Buttocks,Right upper leg,Left upper leg,Right lower leg,Left lower leg     Bathing assist Assist Level: Supervision/Verbal cueing     Upper Body Dressing/Undressing Upper body dressing   What is the patient wearing?: Pull over shirt    Upper body assist Assist Level: Moderate Assistance - Patient 50 - 74%    Lower Body Dressing/Undressing Lower body dressing      What is the patient wearing?: Pants  Lower body assist Assist for lower body dressing: Dependent - Patient 0%     Toileting Toileting    Toileting assist Assist for toileting: Moderate Assistance - Patient 50 - 74%     Transfers Chair/bed transfer  Transfers assist     Chair/bed transfer assist level: Maximal Assistance - Patient 25 - 49%     Locomotion Ambulation   Ambulation assist   Ambulation activity did not occur: Safety/medical concerns  Assist level: Maximal Assistance - Patient 25 - 49% Assistive device: Walker-rolling (& L AFO) Max distance: 25   Walk 10 feet activity   Assist  Walk 10 feet activity did not occur: Safety/medical concerns  Assist level: Maximal Assistance - Patient 25 - 49% Assistive device: Walker-rolling (& L AFO)   Walk 50 feet activity   Assist Walk 50 feet with 2 turns activity did not occur: Safety/medical  concerns         Walk 150 feet activity   Assist Walk 150 feet activity did not occur: Safety/medical concerns         Walk 10 feet on uneven surface  activity   Assist Walk 10 feet on uneven surfaces activity did not occur: Safety/medical concerns         Wheelchair     Assist Will patient use wheelchair at discharge?: Yes (Per PT long term goals) Type of Wheelchair: Manual    Wheelchair assist level: Minimal Assistance - Patient > 75% Max wheelchair distance: 150    Wheelchair 50 feet with 2 turns activity    Assist        Assist Level: Minimal Assistance - Patient > 75%   Wheelchair 150 feet activity     Assist      Assist Level: Minimal Assistance - Patient > 75%   Blood pressure 138/76, pulse 86, temperature 98.9 F (37.2 C), temperature source Oral, resp. rate 16, height 5\' 3"  (1.6 m), weight 78.4 kg, SpO2 95 %.  Medical Problem List and Plan: 1.  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  Antithrombotics: -DVT/anticoagulation:  Pharmaceutical: Continue Lovenox- may need to continue at facility. Educated husband regarding performing injections.              -antiplatelet therapy: Plavix  3. Chronic LBP/Pain Management: Continue Tylenol daily, kpad ordered for right neck to relax the muscles.  4. Mood: Team to provide ego support.              -antipsychotic agents: N/A 5. Neuropsych: This patient is not fully capable of making decisions on her own behalf. 6. Skin/Wound Care: Routine pressure relief measures.  7. Fluids/Electrolytes/Nutrition: Monitor I/O. Electrolytes reviewed and are stable. Repeat Monday 8. H/o depression w/GAD: Continue Ativan 0.25 mg/hs, Cymbalta 60 mg and Remeron 45 mg/hs. Decrese Seroquel to 12.5mg  daily.  9. H/o  MS: on no disease modifying agents; Continue Ampyra daily. Has some spasticity and L foot drop- might benefit from spasticity treatment, has L AFO  10. Dyslipidemia: Chol 243/LDL-143             --Continue Lipitor daily 11. Constipation- improved with prunes and popcorn, suppository ordered prn.  12, Cognitive decline: SLP  ordered- provided with a research study regarding benefits of anti-inflammatory diet for cognition, provided counseling to patient and family regarding optimal nutrition to enhance cognition.  74.  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 Obesity (BMI 30.62): have provided dietary education to patient, her husband, and her daughter-in-law  64. Disposition: Discussed goals with husband: he was providing her significant cognitive and physical support at home- helping her get to and from bathroom. Patient states she was wiping herself. Discussed d/c date of 3/17 with goals of improved ambulation in home with as little support from husband as possible- he will hire additional days of health aide (3 to 5, potentially overnight as well)., and improved right arm function. Long term he is looking for facility for them both to reside.   LOS: 6 days A FACE TO FACE EVALUATION WAS PERFORMED  Clide Deutscher Simrah Chatham 12/19/2020, 4:23 PM

## 2020-12-20 NOTE — Progress Notes (Signed)
Physical Therapy Session Note  Patient Details  Name: Carly [name] MRN: [id number] Date of Birth: April 27, 1951  Today's Date: 12/20/2020 PT Individual Time: 0800-0910 PT Individual Time Calculation (min): 70 min   Short Term Goals: Week 1:  PT Short Term Goal 1 (Week 1): Pt will perform bed mobility with min assist ang use of bed features as needed PT Short Term Goal 2 (Week 1): Pt will performed stand pivot transfer to [facility] with mod assist consistently PT Short Term Goal 3 (Week 1): Pt wil ambulate 28ft with mod assist and LRAD PT Short Term Goal 4 (Week 1): Pt will propell WC 129ft with supervision assist  Skilled Therapeutic Interventions/Progress Updates:   Received pt sitting in recliner, pt agreeable to therapy, and denied any pain during session. RN present to administer medication. Session with focus on functional mobility/transfers, generalized strengthening, dynamic standing balance/coordination, motor control/sequencing, gait training, and improved activity tolerance. Pt continues to remain extremely anxious and required max cues for pursed lip breathing techniques throughout session. Sit<>stand in [place] with CGA and dependent transfer to [facility]. Donned L AFO with total A and pt transported down to 52M therapy gym in [facility] total A for time management purposes. Sit<>stands with mod A using RW throughout session with cues for hand placement and motor planning. Pt ambulated 36ft x 2 trials with RW and mod A +2 for WC follow. Pt required manual facilitation from therapist to control RW and cues to slide RW rather than picking it up. Pt also required cues to increase RLE step length, for upright posture/gaze, and for bilateral knee extension. Pt performed 2 stand<>pivot transfers with RW and mod A throughout session with max cues for sequencing as pt with difficulty moving feet. Pt performed alternating toe taps to 3in step x 6 reps with BUE support and mod A for balance. Pt with increased difficulty  clearing LLE and with increased anxiety. Transitioned to toe taps at staircase for improved stability. Performed x 8 reps of alternating toe taps to 3in step with mod A and cues for technique. Pt with increased frustration with difficulty lifting LLE requiring max encouragement to slow down, take deep breaths, and then try again. Pt performed the following exercises sitting in WC with supervision and verbal cues for technique: -LAQ 2x10 bilaterally -hip adduction ball squeezes 2x10 -hip flexion x5 on RLE and 2x5 (with decreased ROM) on LLE Pt transported back to room in [facility] total A and requested to toilet. Sit<>stand in [place] with CGA and max A for clothing management for time management purposes.  Pt able to void and perform peri-care with supervision. Dependent transfer commode<>recliner with Stedy. Concluded session with pt sitting in recliner, needs within reach, and chair pad alarm on.   Therapy Documentation Precautions:  Precautions Precautions: Fall Precaution Comments: R hemi, has MS with rigid LLE Restrictions Weight Bearing Restrictions: No  Therapy/Group: Individual Therapy Alfonse Alpers PT, DPT   12/20/2020, 7:16 AM

## 2020-12-20 NOTE — Progress Notes (Signed)
PROGRESS NOTE   Subjective/Complaints: Complains of anxiety Feels she did well with therapy today- ambulated 60 feet! Took Seroquel after therapy as she did not want it to make her sleepy. She does not feel too sleepy with the lower dose currently.   ROS: Had BM with suppository, +right sided neck pain, chronic, +anxiety as per husband and nursing, +sedation from seroquel  Objective:   No results found. No results for input(s): WBC, HGB, HCT, PLT in the last 72 hours. No results for input(s): NA, K, CL, CO2, GLUCOSE, BUN, CREATININE, CALCIUM in the last 72 hours.  Intake/Output Summary (Last 24 hours) at 12/20/2020 1131 Last data filed at 12/20/2020 0700 Gross per 24 hour  Intake 660 ml  Output --  Net 660 ml        Physical Exam: Vital Signs Blood pressure (!) 157/72, pulse 84, temperature 98 F (36.7 C), temperature source Oral, resp. rate 18, height 5\' 3"  (1.6 m), weight 78.4 kg, SpO2 98 %. Gen: no distress, normal appearing HEENT: oral mucosa pink and moist, NCAT Cardio: Reg rate Chest: normal effort, normal rate of breathing Abd: soft, non-distended Ext: no edema Psych: pleasant, normal affect Skin: intact Musculoskeletal:     Cervical back: Normal range of motion and neck supple.     Comments: RUE- Bicep, triceps, WE, 5-/5, grip 4+/5 and finger abd 4/5 LUE- biceps, triceps, and WE 5-/5, grip 4/5, and finger abd 4-/5 RLE- HF 4+/5, KE 4+/5, DF 4-/5, and PF 5-/5 LLE- HF 4+/5, KE 4/5, DF 2+/5, and PF 4/5 Has moderate L foot drop noted- from MS  Skin:    Comments: External hemorrhoids- stage II A few bruises on Arms B/L Backside no skin breakdown nor heels B/L   Neurological:     Comments: [employer]- and able to tell me next holiday- even day/date/month and year as well as location and president.  Intact to light touch in all 4 extremities MAS of 2-3 in LLE- no clonus- and maybe 1+-2 at L ankle, , but worse MAS  (2-3) at L hip and knee. No spasticity seen on R side.    Psychiatric:     Comments: Appropriate, but had some slowed processing in answering questions, accessing memory.     Assessment/Plan: 1. Functional deficits which require 3+ hours per day of interdisciplinary therapy in a comprehensive inpatient rehab setting.  Physiatrist is providing close team supervision and 24 hour management of active medical problems listed below.  Physiatrist and rehab team continue to assess barriers to discharge/monitor patient progress toward functional and medical goals  Care Tool:  Bathing    Body parts bathed by patient: Right arm,Chest,Abdomen,Face,Left arm   Body parts bathed by helper: Left arm,Front perineal area,Buttocks,Right upper leg,Left upper leg,Right lower leg,Left lower leg     Bathing assist Assist Level: Supervision/Verbal cueing     Upper Body Dressing/Undressing Upper body dressing   What is the patient wearing?: Pull over shirt    Upper body assist Assist Level: Moderate Assistance - Patient 50 - 74%    Lower Body Dressing/Undressing Lower body dressing      What is the patient wearing?: Pants     Lower  body assist Assist for lower body dressing: Dependent - Patient 0%     Toileting Toileting    Toileting assist Assist for toileting: Moderate Assistance - Patient 50 - 74%     Transfers Chair/bed transfer  Transfers assist     Chair/bed transfer assist level: Moderate Assistance - Patient 50 - 74%     Locomotion Ambulation   Ambulation assist   Ambulation activity did not occur: Safety/medical concerns  Assist level: 2 helpers Assistive device: Walker-rolling Max distance: 24ft   Walk 10 feet activity   Assist  Walk 10 feet activity did not occur: Safety/medical concerns  Assist level: 2 helpers Assistive device: Walker-rolling   Walk 50 feet activity   Assist Walk 50 feet with 2 turns activity did not occur: Safety/medical  concerns         Walk 150 feet activity   Assist Walk 150 feet activity did not occur: Safety/medical concerns         Walk 10 feet on uneven surface  activity   Assist Walk 10 feet on uneven surfaces activity did not occur: Safety/medical concerns         Wheelchair     Assist Will patient use wheelchair at discharge?: Yes (Per PT long term goals) Type of Wheelchair: Manual    Wheelchair assist level: Minimal Assistance - Patient > 75% Max wheelchair distance: 150    Wheelchair 50 feet with 2 turns activity    Assist        Assist Level: Minimal Assistance - Patient > 75%   Wheelchair 150 feet activity     Assist      Assist Level: Minimal Assistance - Patient > 75%   Blood pressure (!) 157/72, pulse 84, temperature 98 F (36.7 C), temperature source Oral, resp. rate 18, height 5\' 3"  (1.6 m), weight 78.4 kg, SpO2 98 %.  Medical Problem List and Plan: 1.  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  Antithrombotics: -DVT/anticoagulation:  Pharmaceutical: Continue Lovenox-ambulating 60 feet             -antiplatelet therapy: Plavix  3. Chronic LBP/Pain Management: Continue Tylenol daily, kpad ordered for right neck to relax the muscles.  4. Mood: Team to provide ego support.              -antipsychotic agents: N/A 5. Neuropsych: This patient is not fully capable of making decisions on her own behalf. 6. Skin/Wound Care: Routine pressure relief measures.  7. Fluids/Electrolytes/Nutrition: Monitor I/O. Electrolytes reviewed and are stable. Repeat Monday 8. H/o depression w/GAD: Continue Ativan 0.25 mg/hs, Cymbalta 60 mg and Remeron 45 mg/hs. Decrese Seroquel to 12.5mg  daily.- she appears to be less sleepy with this dose, continue 9. H/o MS: on no  disease modifying agents; Continue Ampyra daily. Has some spasticity and L foot drop- might benefit from spasticity treatment, has L AFO  10. Dyslipidemia: Chol 243/LDL-143             --Continue Lipitor daily 11. Constipation- improved with prunes and popcorn, suppository ordered prn.  12, Cognitive decline: SLP ordered- provided with a research study regarding benefits of anti-inflammatory diet for cognition, provided counseling to patient  and family regarding optimal nutrition to enhance cognition.  10.  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 Obesity (BMI 30.62): have provided dietary education to patient, her husband, and her daughter-in-law  8. Disposition: Discussed goals with husband: he was providing her significant cognitive and physical support at home- helping her get to and from bathroom. Patient states she was wiping herself. Discussed d/c date of 3/17 with goals of improved ambulation in home with as little support from husband as possible- he will hire additional days of health aide (3 to 5, potentially overnight as well)., and improved right arm function. Long term he is looking for facility for them both to reside.   LOS: 7 days A FACE TO FACE EVALUATION WAS PERFORMED  Carly [name] Tilia Faso 12/20/2020, 11:31 AM

## 2020-12-20 NOTE — Progress Notes (Signed)
Speech Language Pathology Daily Session Note  Patient Details  Name: MAIRIN LINDSLEY MRN: [id number] Date of Birth: Dec 07, 1950  Today's Date: 12/20/2020 SLP Individual Time: 6270-3500 SLP Individual Time Calculation (min): 35 min  Short Term Goals: Week 1: SLP Short Term Goal 1 (Week 1): Patient will perform mild complex problem solving tasks (medication management, financial management) with mod A. SLP Short Term Goal 2 (Week 1): Patient will demonstrate increased insight/awareness to deficits and errors during task production and attempt to correct with min A cues. SLP Short Term Goal 3 (Week 1): SLP with administer formal cognitive assessment SLP Short Term Goal 4 (Week 1): Pt will increase functional recall with immediate and short term delay provided mod A cues for use of compensatory strategies  Skilled Therapeutic Interventions:   Patient seen to address cognitive-linguistic goals. She had belongs scattered about room (on bed, on chair, on sofa) and in general just seemed disorganized. Patient demonstrated learned exercises from OT with putty, sponges. She was fidgety and did have difficulty with attention but was pleasant and cooperative. SLP was able to redirect her with verbal cues. She described some of her deficits and impact on function, but required min-modA cues to do so as she would become tangential. She showed SLP how she was able to operate her phone but stating she was having trouble "pushing too hard" with her fingers when trying to lightly tap. SLP encouraged patient to speak with OT about this issue. Patient continues to benefit from skilled SLP intervention to maximize cognitive-linguistic function prior to discharge.  Pain Pain Assessment Pain Scale: 0-10 Pain Score: 0-No pain  Therapy/Group: Individual Therapy   Sonia Baller, MA, CCC-SLP Speech Therapy

## 2020-12-20 NOTE — Progress Notes (Signed)
Occupational Therapy Session Note  Patient Details  Name: Carly [name] MRN: [id number] Date of Birth: 21-Feb-1951  Today's Date: 12/20/2020 OT Individual Time: 1500-1600 OT Individual Time Calculation (min): 60 min    Short Term Goals: Week 1:  OT Short Term Goal 1 (Week 1): Pt will complete sit > stand with max assist of one caregiver to progress towards standing for LB self-care tasks OT Short Term Goal 2 (Week 1): Pt will complete LB dressing with max assist of one caregiver OT Short Term Goal 3 (Week 1): Pt will complete toilet transfer with mod assist stand vs squat pivot OT Short Term Goal 4 (Week 1): Pt will complete bathing with mod assist  Skilled Therapeutic Interventions/Progress Updates:    1:1. Pt received in recliner requesting to shower. D/t poor configuration of bathroom and threshold, OT selects TTB to cross threshold and uses stedy to transport pt into shower. Pt able to step back off of stedy to sit onto shower and requires MOD A to scoot into shower on TTB. Pt bathes with MOD A overall leaning alterally for OT to wash buttocks. tp requires MAX A to scoot out of shower and stand pivot to w/c. stedy transfer to recliner d/t fatigue for dressing and pt requrires total A for LB dressing ultimately resulting in sit to stand in stedy for clothing management. Pt attempted multiple sit to stands from recliner up unable to achieve upright position. Perched in stedy pt dons shirt with S. Exited session with pt seated in bed, exit alarm on and call light in reach   Therapy Documentation Precautions:  Precautions Precautions: Fall Precaution Comments: R hemi, has MS with rigid LLE Restrictions Weight Bearing Restrictions: No General:   Vital Signs:  Pain: Pain Assessment Pain Score: 0-No pain Faces Pain Scale: No hurt ADL: ADL Upper Body Bathing: Minimal assistance Where Assessed-Upper Body Bathing: Edge of bed Lower Body Bathing: Maximal assistance Where  Assessed-Lower Body Bathing: Edge of bed Upper Body Dressing: Moderate assistance Where Assessed-Upper Body Dressing: Edge of bed Lower Body Dressing: Dependent Where Assessed-Lower Body Dressing: Edge of bed Toileting: Dependent (Mod A sit > stand with Charlaine Dalton) Where Assessed-Toileting: Bedside Commode Toilet Transfer: Dependent Customer service manager) Toilet Transfer Method:  Charlaine Dalton) Science writer: Forensic psychologist   Exercises:   Other Treatments:     Therapy/Group: Individual Therapy  Tonny Branch 12/20/2020, 12:26 PM

## 2020-12-21 NOTE — Progress Notes (Signed)
Physical Therapy Weekly Progress Note  Patient Details  Name: Carly [name] MRN: [id number] Date of Birth: Apr 26, 1951  Beginning of progress report period: December 14, 2020 End of progress report period: December 21, 2020  Patient has met 0 of 4 short term goals. Pt demonstrates slow progress towards long term goals. Pt currently requires mod A for bed mobility, mod A for sit<>stand and stand<>pivot transfers, and mod A to ambulate 9f with RW. Pt continues to be extremely anxious during sessions and is limited by decreased motor planning and sequencing, generalized weakness, poor endurance, and increased difficulty with problem solving.    Patient continues to demonstrate the following deficits muscle weakness, decreased cardiorespiratoy endurance, impaired timing and sequencing, unbalanced muscle activation, motor apraxia, decreased coordination and decreased motor planning and decreased sitting balance, decreased standing balance, decreased postural control and decreased balance strategies and therefore will continue to benefit from skilled PT intervention to increase functional independence with mobility.  Patient progressing toward long term goals..  Continue plan of care.  PT Short Term Goals Week 1:  PT Short Term Goal 1 (Week 1): Pt will perform bed mobility with min assist and use of bed features as needed PT Short Term Goal 1 - Progress (Week 1): Progressing toward goal PT Short Term Goal 2 (Week 1): Pt will perform stand [facility] consistently PT Short Term Goal 2 - Progress (Week 1): Progressing toward goal PT Short Term Goal 3 (Week 1): Pt wil ambulate [id number] mod assist and LRAD PT Short Term Goal 3 - Progress (Week 1): Progressing toward goal PT Short Term Goal 4 (Week 1): Pt will propell WC [id number] supervision assist PT Short Term Goal 4 - Progress (Week 1): Progressing toward goal Week 2:  PT Short Term Goal 1 (Week 2): Pt will perform stand  pivot transfer to [facility] mod assist consistently PT Short Term Goal 2 (Week 2): Pt wil ambulate [id number] mod assist and LRAD PT Short Term Goal 3 (Week 2): Pt will initiate stair training  Skilled Therapeutic Interventions/Progress Updates:  Ambulation/gait training;Balance/vestibular training;Cognitive remediation/compensation;Disease management/prevention;Discharge planning;Community reintegration;DME/adaptive equipment instruction;Functional electrical stimulation;Functional mobility training;Patient/family education;Psychosocial support;Neuromuscular re-education;Skin care/wound management;Pain management;Splinting/orthotics;Stair training;UE/LE Strength taining/ROM;UE/LE Coordination activities;Therapeutic Activities;Therapeutic Exercise;Visual/perceptual remediation/compensation;Wheelchair propulsion/positioning   Therapy Documentation Precautions:  Precautions Precautions: Fall Precaution Comments: R hemi, has MS with rigid LLE Restrictions Weight Bearing Restrictions: No  Therapy/Group: Individual Therapy AnnaAlfonse Alpers DPT   12/21/2020, 7:26 AM

## 2020-12-21 NOTE — Progress Notes (Signed)
Occupational Therapy Weekly Progress Note  Patient Details  Name: Carly [name] MRN: [id number] Date of Birth: September 20, 1951  Beginning of progress report period: December 14, 2020 End of progress report period: December 21, 2020  Today's Date: 12/21/2020 OT Individual Time: 1350-1430 OT Individual Time Calculation (min): 40 min    Patient has met 2 of 4 short term goals.  Pt is making slow progress towards goals due to anxiety with movement.  Pt is able to complete sit > stand and transfers with [facility] with min assist, however requires mod assist to max assist for sit > stand with RW.  To this point, have been unable to complete any stand pivot transfers or ambulatory transfers.  Pt is demonstrating improved use of dominant RUE with oral care, brushing hair, and self-feeding.  Pt requires increased cues and reassurance when utilizing RUE during functional tasks.  Pt easily frustrated with decreased strength and coordination in RUE.  Pt's husband and caregiver assisted with bathing and dressing PTA and will continue to provide assistance with self-care tasks.   Patient continues to demonstrate the following deficits: muscle weakness, decreased cardiorespiratoy endurance, impaired timing and sequencing, unbalanced muscle activation and decreased coordination, decreased attention to right and decreased motor planning, decreased awareness, decreased problem solving and decreased memory and decreased sitting balance, decreased standing balance, decreased postural control, hemiplegia and decreased balance strategies and therefore will continue to benefit from skilled OT intervention to enhance overall performance with BADL and Reduce care partner burden.  Patient not progressing toward long term goals.  See goal revision..  Plan of care revisions: downgraded LB dressing to mod assist.  OT Short Term Goals Week 1:  OT Short Term Goal 1 (Week 1): Pt will complete sit > stand with max assist of one caregiver  to progress towards standing for LB self-care tasks OT Short Term Goal 1 - Progress (Week 1): Met OT Short Term Goal 2 (Week 1): Pt will complete LB dressing with max assist of one caregiver OT Short Term Goal 2 - Progress (Week 1): Progressing toward goal OT Short Term Goal 3 (Week 1): Pt will complete toilet transfer with mod assist stand vs squat pivot OT Short Term Goal 3 - Progress (Week 1): Progressing toward goal OT Short Term Goal 4 (Week 1): Pt will complete bathing with mod assist OT Short Term Goal 4 - Progress (Week 1): Met Week 2:  OT Short Term Goal 1 (Week 2): Pt will complete self-feeding with supervision/setup with use of built up handle with <2 spills OT Short Term Goal 2 (Week 2): Pt will complete toilet transfer with mod assist stand vs squat pivot OT Short Term Goal 3 (Week 2): Pt will complete LB dressing with max assist of one caregiver  Skilled Therapeutic Interventions/Progress Updates:    Treatment session with focus on RUE NMR and functional use of dominant RUE.  Pt received upright in recliner reporting improvements with self-feeding with breakfast foods and even when she had eaten spaghetti for a meal.  Pt reports having difficulty with salad today due to lettuce leaf size.  Engaged in [place] in sitting with focus on reach and manipulation of medium sized pegs to place and remove.  Therapist providing mod cues to utilize RUE as pt frequently transitioning to use of LUE.  Utilized resistive clothespins with focus on grip strengthening and various grip pinches.  Therapist increased challenge to replicate written pattern with pegs requiring sequencing, memory, and increased organization.  Pt requiring mod-max  cues for sequencing and organization to increase success, also continues to require cues to utilize RUE as pt frequently transitioning to use of LUE.  Discussed functional carryover of task to other familiar, functional tasks.  Pt remained upright in recliner with all  needs in reach.  Therapy Documentation Precautions:  Precautions Precautions: Fall Precaution Comments: R hemi, has MS with rigid LLE Restrictions Weight Bearing Restrictions: No General:   Vital Signs: Therapy Vitals Temp: 98.7 F (37.1 C) Temp Source: Oral Pulse Rate: 83 Resp: 14 BP: (!) 141/65 Patient Position (if appropriate): Lying Oxygen Therapy SpO2: 99 % O2 Device: Room Air Pain:   pt with c/o pain in neck.  RN provided pain meds at end of session.   Therapy/Group: Individual Therapy  Simonne Come 12/21/2020, 8:10 AM

## 2020-12-21 NOTE — Progress Notes (Signed)
Physical Therapy Session Note  Patient Details  Name: Carly [name] MRN: [id number] Date of Birth: 1951/08/01  Today's Date: 12/21/2020 PT Individual Time: 1000-1053 and 1500-1526 PT Individual Time Calculation (min): 53 min and 26 min  Short Term Goals: Week 1:  PT Short Term Goal 1 (Week 1): Pt will perform bed mobility with min assist and use of bed features as needed PT Short Term Goal 1 - Progress (Week 1): Progressing toward goal PT Short Term Goal 2 (Week 1): Pt will perform stand pivot transfer to [facility] with mod assist consistently PT Short Term Goal 2 - Progress (Week 1): Progressing toward goal PT Short Term Goal 3 (Week 1): Pt wil ambulate 42ft with mod assist and LRAD PT Short Term Goal 3 - Progress (Week 1): Progressing toward goal PT Short Term Goal 4 (Week 1): Pt will propell WC 162ft with supervision assist PT Short Term Goal 4 - Progress (Week 1): Progressing toward goal Week 2:  PT Short Term Goal 1 (Week 2): Pt will perform stand pivot transfer to [facility] with mod assist consistently PT Short Term Goal 2 (Week 2): Pt wil ambulate 8ft with mod assist and LRAD PT Short Term Goal 3 (Week 2): Pt will initiate stair training  Skilled Therapeutic Interventions/Progress Updates:  Treatment Session 1: 1000-1053 53 min Received pt sitting in recliner, pt agreeable to therapy, and reported pain 3/10 in neck. Session with focus on functional mobility/transfers, generalized strengthening, dynamic standing balance/coordination, simulated car transfers, toileting, and improved endurance with activity. Doffed non-skid socks and donned compression socks and L AFO with total A. Pt reported urge to urinate and transferred sit<>stand in [place] with CGA. Pt required mod A overall for clothing management and able to void. RN present to administer medication. Pt transported to 2M ortho gym in [facility] total A for time management purposes and performed simulated car transfer with RW and heavy max A. Pt  required max cues for stepping sequence, turning technique, RW management, and posture due to poor motor planning and sequencing. Pt with increased anxiety abandoning RW to grab onto therapist and onto mobile doorframe. Pt required max A for BLE management into/out of car. Pt with crouched posture, flexed trunk, and moderate R lateral lean onto therapist. Due to safety concerns transferred back into WC via squat<>pivot with mod A. Pt reported feeling nervous stating "I shouldn't have had 2 cups of coffee". Pt transported back to room in [facility] total A and returned to recliner via Stedy due to fatigue and increased anxiety. Pt extremely labile, therefore worked on deep breathing with cues to "breathe in through the nose and out through the mouth"; pt able to verbalize and demonstrate technique. Encouraged pt to perform throughout the day. Concluded session with pt sitting in recliner, needs within reach, and chair pad alarm on. RN notified to administer anxiety medication and therapist provided pt with fresh ice water.   Treatment Session 2: 1500-1526 26 min Received pt sitting in recliner, pt agreeable to therapy, and did not state pain level during session. Session with focus on functional mobility/transfers, generalized strengthening, dynamic standing balance/coordination, gait training, toileting, and improved endurance with activity. Pt reported urge to use restroom and and transferred sit<>stand in [place] with CGA. Pt required mod A for clothing management and ultimately unable to void. Pt transferred sit<>stand with RW and mod A and ambulated 46ft with RW and mod A +2. Pt required manual facilitation for RW management and cues for upright posture, upright posture/gaze, lateral  weight shifting, and to increase R step length. Pt transported back to recliner in [place] dependently. Concluded session with pt sitting in recliner, needs within reach, and bed alarm on.   Therapy Documentation Precautions:   Precautions Precautions: Fall Precaution Comments: R hemi, has MS with rigid LLE Restrictions Weight Bearing Restrictions: No  Therapy/Group: Individual Therapy Alfonse Alpers PT, DPT   12/21/2020, 7:28 AM

## 2020-12-21 NOTE — Plan of Care (Signed)
  Problem: RH Dressing Goal: LTG Patient will perform lower body dressing w/assist (OT) Description: LTG: Patient will perform lower body dressing with assist, with/without cues in positioning using equipment (OT) Flowsheets (Taken 12/21/2020 0813) LTG: Pt will perform lower body dressing with assistance level of: (downgraded) Moderate Assistance - Patient 50 - 74% Note: Downgraded as not a primary focus of therapy at this time and husband assisting with clothing management PTA

## 2020-12-21 NOTE — Progress Notes (Signed)
PROGRESS NOTE   Subjective/Complaints: No complaints this morning The lower dose of Seroquel helped with her anxiety without sedating her.   ROS: Had BM with suppository, +right sided neck pain, chronic, +anxiety as per husband and nursing, less sedation from seroquel  Objective:   No results found. No results for input(s): WBC, HGB, HCT, PLT in the last 72 hours. No results for input(s): NA, K, CL, CO2, GLUCOSE, BUN, CREATININE, CALCIUM in the last 72 hours.  Intake/Output Summary (Last 24 hours) at 12/21/2020 1350 Last data filed at 12/21/2020 0700 Gross per 24 hour  Intake 480 ml  Output --  Net 480 ml        Physical Exam: Vital Signs Blood pressure (!) 141/76, pulse 87, temperature 98.7 F (37.1 C), temperature source Oral, resp. rate 18, height 5\' 3"  (1.6 m), weight 78.4 kg, SpO2 97 %. Gen: no distress, normal appearing HEENT: oral mucosa pink and moist, NCAT Cardio: Reg rate Chest: normal effort, normal rate of breathing Abd: soft, non-distended Ext: no edema Psych: pleasant, normal affect Skin: intact Musculoskeletal:     Cervical back: Normal range of motion and neck supple.     Comments: RUE- Bicep, triceps, WE, 5-/5, grip 4+/5 and finger abd 4/5 LUE- biceps, triceps, and WE 5-/5, grip 4/5, and finger abd 4-/5 RLE- HF 4+/5, KE 4+/5, DF 4-/5, and PF 5-/5 LLE- HF 4+/5, KE 4/5, DF 2+/5, and PF 4/5 Has moderate L foot drop noted- from MS  Skin:    Comments: External hemorrhoids- stage II A few bruises on Arms B/L Backside no skin breakdown nor heels B/L   Neurological:     Comments: [employer]- and able to tell me next holiday- even day/date/month and year as well as location and president.  Intact to light touch in all 4 extremities MAS of 2-3 in LLE- no clonus- and maybe 1+-2 at L ankle, , but worse MAS (2-3) at L hip and knee. No spasticity seen on R side.    Psychiatric:     Comments: Appropriate, but had  some slowed processing in answering questions, accessing memory.     Assessment/Plan: 1. Functional deficits which require 3+ hours per day of interdisciplinary therapy in a comprehensive inpatient rehab setting.  Physiatrist is providing close team supervision and 24 hour management of active medical problems listed below.  Physiatrist and rehab team continue to assess barriers to discharge/monitor patient progress toward functional and medical goals  Care Tool:  Bathing    Body parts bathed by patient: Right arm,Chest,Abdomen,Face,Left arm,Front perineal area,Right upper leg,Left upper leg   Body parts bathed by helper: Left arm,Buttocks,Right lower leg,Left lower leg     Bathing assist Assist Level: Moderate Assistance - Patient 50 - 74%     Upper Body Dressing/Undressing Upper body dressing   What is the patient wearing?: Pull over shirt    Upper body assist Assist Level: Supervision/Verbal cueing    Lower Body Dressing/Undressing Lower body dressing      What is the patient wearing?: Pants,Underwear/pull up     Lower body assist Assist for lower body dressing: Total Assistance - Patient < 25%     Toileting Toileting  Toileting assist Assist for toileting: Moderate Assistance - Patient 50 - 74%     Transfers Chair/bed transfer  Transfers assist     Chair/bed transfer assist level: Moderate Assistance - Patient 50 - 74%     Locomotion Ambulation   Ambulation assist   Ambulation activity did not occur: Safety/medical concerns  Assist level: 2 helpers Assistive device: Walker-rolling Max distance: 86ft   Walk 10 feet activity   Assist  Walk 10 feet activity did not occur: Safety/medical concerns  Assist level: 2 helpers Assistive device: Walker-rolling   Walk 50 feet activity   Assist Walk 50 feet with 2 turns activity did not occur: Safety/medical concerns         Walk 150 feet activity   Assist Walk 150 feet activity did not  occur: Safety/medical concerns         Walk 10 feet on uneven surface  activity   Assist Walk 10 feet on uneven surfaces activity did not occur: Safety/medical concerns         Wheelchair     Assist Will patient use wheelchair at discharge?: Yes (Per PT long term goals) Type of Wheelchair: Manual    Wheelchair assist level: Minimal Assistance - Patient > 75% Max wheelchair distance: 150    Wheelchair 50 feet with 2 turns activity    Assist        Assist Level: Minimal Assistance - Patient > 75%   Wheelchair 150 feet activity     Assist      Assist Level: Minimal Assistance - Patient > 75%   Blood pressure (!) 141/76, pulse 87, temperature 98.7 F (37.1 C), temperature source Oral, resp. rate 18, height 5\' 3"  (1.6 m), weight 78.4 kg, SpO2 97 %.  Medical Problem List and Plan: 1.  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  Antithrombotics: -DVT/anticoagulation:  Pharmaceutical: Continue Lovenox-ambulating 60 feet             -antiplatelet therapy: Plavix  3. Chronic LBP/Pain Management: Continue Tylenol daily, kpad ordered for right neck to relax the muscles.  4. Mood: Team to provide ego support.              -antipsychotic agents: N/A 5. Neuropsych: This patient is not fully capable of making decisions on her own behalf. 6. Skin/Wound Care: Routine pressure relief measures.  7. Fluids/Electrolytes/Nutrition: Monitor I/O. Electrolytes reviewed and are stable. Repeat Monday 8. H/o depression w/GAD: Conitnue Ativan 0.25 mg/hs, Cymbalta 60 mg and Remeron 45 mg/hs. Decrese Seroquel to 12.5mg  daily.- she appears to be less sleepy with this dose, continue 9. H/o MS: on no disease modifying agents; Continue Ampyra daily. Has some spasticity and L foot drop-  might benefit from spasticity treatment, has L AFO  10. Dyslipidemia: Chol 243/LDL-143             --Continue Lipitor daily 11. Constipation- improved with prunes and popcorn, suppository ordered prn.  12, Cognitive decline: SLP ordered- provided with a research study regarding benefits of anti-inflammatory diet for cognition, provided counseling to patient and family regarding optimal nutrition to enhance cognition.  74.  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 Obesity (BMI 30.62): have provided dietary education to patient, her husband, and her daughter-in-law  30. Disposition: Discussed goals with husband: he was providing her significant cognitive and physical support at home- helping her get to and from bathroom. Patient states she was wiping herself. Discussed d/c date of 3/17 with goals of improved ambulation in home with as little support from husband as possible- he will hire additional days of health aide (3 to 5, potentially overnight as well)., and improved right arm function. Long term he is looking for facility for them both to reside. Discussed plan for outpatient follow-up.    LOS: 8 days A FACE TO FACE EVALUATION WAS PERFORMED  Thoams Siefert P Keighley Deckman 12/21/2020, 1:50 PM

## 2020-12-21 NOTE — Progress Notes (Signed)
Speech Language Pathology Weekly Progress and Session Note  Patient Details  Name: Carly [name] MRN: [id number] Date of Birth: [birth year]  Beginning of progress report period: 12/15/2020 End of progress report period: 12/21/2020  Today's Date: 12/21/2020 SLP Individual Time: 0850-0950 SLP Individual Time Calculation (min): 60 min  Short Term Goals: Week 1: SLP Short Term Goal 1 (Week 1): Patient will perform mild complex problem solving tasks (medication management, financial management) with mod A. SLP Short Term Goal 1 - Progress (Week 1): Met SLP Short Term Goal 2 (Week 1): Patient will demonstrate increased insight/awareness to deficits and errors during task production and attempt to correct with min A cues. SLP Short Term Goal 2 - Progress (Week 1): Progressing toward goal SLP Short Term Goal 3 (Week 1): SLP with administer formal cognitive assessment SLP Short Term Goal 3 - Progress (Week 1): Progressing toward goal SLP Short Term Goal 4 (Week 1): Pt will increase functional recall with immediate and short term delay provided mod A cues for use of compensatory strategies SLP Short Term Goal 4 - Progress (Week 1): Met    New Short Term Goals: Week 2: SLP Short Term Goal 1 (Week 2): Patient will participate in completing standardized cognitive testing SLP Short Term Goal 2 (Week 2): Patient will perform mildly complex function tasks (medication, money management, etc) with minA cues. SLP Short Term Goal 3 (Week 2): Patient will demonstrate awareness to errors and attempt self-correction during functional ADL's with minA cues. SLP Short Term Goal 4 (Week 2): Patient will return demonstrated learned exercises, strategies with minA cues. SLP Short Term Goal 5 (Week 2): Patient will complete mild complex organizational tasks with min-modA cues.  Weekly Progress Updates:   Patient made good progress, meeting 2/4 STG's but making progress towards one of the goals she did not meet and  another goal was not met due to SLP focusing on other cognitive tasks (goal was to complete cognitive standardized testing). Patient continues to benefit from cognitive therapy from SLP and is expected to achieve STG"s and LTG's with continued CIR based ST treatment.   Intensity: Minumum of 1-2 x/day, 30 to 90 minutes Frequency: 3 to 5 out of 7 days Duration/Length of Stay: 3/26 Treatment/Interventions: Cognitive remediation/compensation;Therapeutic Activities;Therapeutic Exercise;Functional tasks;Cueing hierarchy;Internal/external aids;Medication managment;Patient/family education   Daily Session  Skilled Therapeutic Interventions: Patient seen for skilled ST session focusing on cognitive goals. Patient was appreciative of SLP's assistance with organizing her therapy papers (schedules, exercises, etc). She completed medication organization task after review of current medication list, requiring initially modA cues, but improving to min-modA cues for placing correct number of pills in correct boxes of pill organizer. She commented that it was "hard work" but also that it was very beneficial for her to learn how to do this task. SLP stressed importance of continuing working on organization as patient appears disorganized in thoughts as well as with her belongings in room. Patient in agreement and plan to continue pill box management next visit. Patient continues to benefit from skilled SLP intervention to maximize cognitive function prior to discharge.     General    Pain Pain Assessment Pain Scale: 0-10 Pain Score: 0-No pain Faces Pain Scale: Hurts a little bit  Therapy/Group: Individual Therapy  Sonia Baller, MA, CCC-SLP Speech Therapy

## 2020-12-22 DIAGNOSIS — G35 Multiple sclerosis: Secondary | ICD-10-CM

## 2020-12-22 DIAGNOSIS — F419 Anxiety disorder, unspecified: Secondary | ICD-10-CM

## 2020-12-22 DIAGNOSIS — F32A Depression, unspecified: Secondary | ICD-10-CM

## 2020-12-22 DIAGNOSIS — F482 Pseudobulbar affect: Secondary | ICD-10-CM

## 2020-12-22 NOTE — Progress Notes (Signed)
PROGRESS NOTE   Subjective/Complaints: Patient seen sitting up in bed this morning.  She states she slept well overnight.  She notes that she is hot in the room temperature is higher than what she is used to.  She requests a turndown room temperature.  ROS: Denies CP, SOB, N/V/D  Objective:   No results found. No results for input(s): WBC, HGB, HCT, PLT in the last 72 hours. No results for input(s): NA, K, CL, CO2, GLUCOSE, BUN, CREATININE, CALCIUM in the last 72 hours.  Intake/Output Summary (Last 24 hours) at 12/22/2020 0847 Last data filed at 12/22/2020 0730 Gross per 24 hour  Intake 540 ml  Output --  Net 540 ml        Physical Exam: Vital Signs Blood pressure 136/84, pulse 84, temperature 98.7 F (37.1 C), temperature source Oral, resp. rate 14, height 5\' 3"  (1.6 m), weight 78.4 kg, SpO2 94 %. Constitutional: No distress . Vital signs reviewed. HENT: Normocephalic.  Atraumatic. Eyes: EOMI. No discharge. Cardiovascular: No JVD.  RRR. Respiratory: Normal effort.  No stridor.  Bilateral clear to auscultation. GI: Non-distended.  BS +. Skin: Warm and dry.  Intact. Psych: Normal mood.  Normal behavior. Musc: No edema in extremities.  No tenderness in extremities. Neuro: Alert RUE/RLE: Grossly 5/5 proximal distal LUE: Grossly 4+/5 proximal distal LLE- HF 4+/5, KE 4/5, DF 2+/5  Assessment/Plan: 1. Functional deficits which require 3+ hours per day of interdisciplinary therapy in a comprehensive inpatient rehab setting.  Physiatrist is providing close team supervision and 24 hour management of active medical problems listed below.  Physiatrist and rehab team continue to assess barriers to discharge/monitor patient progress toward functional and medical goals  Care Tool:  Bathing    Body parts bathed by patient: Right arm,Chest,Abdomen,Face,Left arm,Front perineal area,Right upper leg,Left upper leg   Body parts  bathed by helper: Left arm,Buttocks,Right lower leg,Left lower leg     Bathing assist Assist Level: Moderate Assistance - Patient 50 - 74%     Upper Body Dressing/Undressing Upper body dressing   What is the patient wearing?: Pull over shirt    Upper body assist Assist Level: Supervision/Verbal cueing    Lower Body Dressing/Undressing Lower body dressing      What is the patient wearing?: Pants,Underwear/pull up     Lower body assist Assist for lower body dressing: Total Assistance - Patient < 25%     Toileting Toileting    Toileting assist Assist for toileting: Moderate Assistance - Patient 50 - 74%     Transfers Chair/bed transfer  Transfers assist     Chair/bed transfer assist level: Moderate Assistance - Patient 50 - 74%     Locomotion Ambulation   Ambulation assist   Ambulation activity did not occur: Safety/medical concerns  Assist level: 2 helpers Assistive device: Walker-rolling Max distance: 46ft   Walk 10 feet activity   Assist  Walk 10 feet activity did not occur: Safety/medical concerns  Assist level: 2 helpers Assistive device: Walker-rolling   Walk 50 feet activity   Assist Walk 50 feet with 2 turns activity did not occur: Safety/medical concerns         Walk 150 feet activity  Assist Walk 150 feet activity did not occur: Safety/medical concerns         Walk 10 feet on uneven surface  activity   Assist Walk 10 feet on uneven surfaces activity did not occur: Safety/medical concerns         Wheelchair     Assist Will patient use wheelchair at discharge?: Yes (Per PT long term goals) Type of Wheelchair: Manual    Wheelchair assist level: Minimal Assistance - Patient > 75% Max wheelchair distance: 150    Wheelchair 50 feet with 2 turns activity    Assist        Assist Level: Minimal Assistance - Patient > 75%   Wheelchair 150 feet activity     Assist      Assist Level: Minimal Assistance  - Patient > 75%   Blood pressure 136/84, pulse 84, temperature 98.7 F (37.1 C), temperature source Oral, resp. rate 14, height 5\' 3"  (1.6 m), weight 78.4 kg, SpO2 94 %.  Medical Problem List and Plan: 1.  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  Antithrombotics: -DVT/anticoagulation:  Pharmaceutical: Continue Lovenox             -antiplatelet therapy: Plavix  3. Chronic LBP/Pain Management: Continue Tylenol daily, kpad ordered for right neck to relax the muscles.  4. Mood: Team to provide ego support.              -antipsychotic agents: N/A 5. Neuropsych: This patient is not fully capable of making decisions on her own behalf. 6. Skin/Wound Care: Routine pressure relief measures.  7. Fluids/Electrolytes/Nutrition: Monitor I/Os. Electrolytes reviewed and are stable.  8. H/o depression w/GAD: Conitnue Ativan 0.25 mg/hs, Cymbalta 60 mg and Remeron 45 mg/hs. Decrese Seroquel to 12.5mg  daily.  Appears to be controlled on 3/12 9. MS: on no disease modifying agents; Continue Ampyra daily. Has some spasticity and L foot drop- might benefit from spasticity treatment, has L AFO  10. Dyslipidemia: Chol 243/LDL-143             Lipitor daily 11. Constipation  Improved 12, Cognitive decline: SLP ordered- counseling to patient and family regarding optimal nutrition to enhance cognition.  13.  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 Obesity (BMI 30.62): have provided dietary education to patient, her husband, and her daughter-in-law  61. Disposition: Discussed goals with husband: he was providing her significant cognitive and physical support at home- helping her get to and from bathroom. Patient states she was wiping herself. Discussed d/c date of 3/17 with goals of improved ambulation in home with as little support from husband as possible- he will hire additional  days of health aide (3 to 5, potentially overnight as well)., and improved right arm function. Long term he is looking for facility for them both to reside. Discussed plan for outpatient follow-up.    LOS: 9 days A FACE TO FACE EVALUATION WAS PERFORMED  Macky Galik Lorie Phenix 12/22/2020, 8:47 AM

## 2020-12-22 NOTE — Progress Notes (Signed)
Occupational Therapy Session Note  Patient Details  Name: Carly [name] MRN: [id number] Date of Birth: [birth year]  Today's Date: 12/22/2020 OT Individual Time: 1050-1200 OT Individual Time Calculation (min): 70 min    Short Term Goals: Week 2:  OT Short Term Goal 1 (Week 2): Pt will complete self-feeding with supervision/setup with use of built up handle with <2 spills OT Short Term Goal 2 (Week 2): Pt will complete toilet transfer with mod assist stand vs squat pivot OT Short Term Goal 3 (Week 2): Pt will complete LB dressing with max assist of one caregiver  Skilled Therapeutic Interventions/Progress Updates:    Pt received in recliner stating she needed to toilet right away - used stedy to transport to bathroom and then to sink for grooming, oral care, UB dressing.  Used stedy for support with LB dressing and had pt focus on upright posture while she alternated hands reaching to pull pants over hips with mod A.  Spent quite a bit of time working on sit to stand strength using the stedy for support through knees. Had stedy backed up to her recliner and had pt pretend she was standing at a ballet bar. Pt worked on squatting down towards seat (pads moved) and rising back up with good extension 10x 3 sets.     Pt seems to have decreased awareness as she frequently stated, "oh I can just stand up!" whereas the therapy notes indicate she needs mod to even max A.    Worked on R hand [facility] with prewriting exerices and reviewed the [facility] exercises/ handouts with theraputty left by her primary OT.  Pt in recliner with all needs met, alarm on.  Therapy Documentation Precautions:  Precautions Precautions: Fall Precaution Comments: R hemi, has MS with rigid LLE Restrictions Weight Bearing Restrictions: No    Vital Signs: Therapy Vitals Temp: 98.7 F (37.1 C) Temp Source: Oral Pulse Rate: 84 Resp: 14 BP: 136/84 Patient Position (if appropriate): Lying Oxygen Therapy SpO2: 94 % O2  Device: Room Air Pain:   ADL: ADL Upper Body Bathing: Minimal assistance Where Assessed-Upper Body Bathing: Edge of bed Lower Body Bathing: Maximal assistance Where Assessed-Lower Body Bathing: Edge of bed Upper Body Dressing: Moderate assistance Where Assessed-Upper Body Dressing: Edge of bed Lower Body Dressing: Dependent Where Assessed-Lower Body Dressing: Edge of bed Toileting: Dependent (Mod A sit > stand with Charlaine Dalton) Where Assessed-Toileting: Bedside Commode Toilet Transfer: Dependent Charlaine Dalton) Toilet Transfer Method:  Charlaine Dalton) Toilet Transfer Equipment: Bedside commode   Therapy/Group: Individual Therapy  [place] 12/22/2020, 8:28 AM

## 2020-12-23 NOTE — Plan of Care (Signed)
  Problem: Consults Goal: RH STROKE PATIENT EDUCATION Description: See Patient Education module for education specifics  Outcome: Progressing   Problem: RH SKIN INTEGRITY Goal: RH STG SKIN FREE OF INFECTION/BREAKDOWN Description: Skin will be free of infection/breakdown with min assist Outcome: Progressing   Problem: RH SAFETY Goal: RH STG ADHERE TO SAFETY PRECAUTIONS W/ASSISTANCE/DEVICE Description: STG Adhere to Safety Precautions With min Assistance/Device. Outcome: Progressing   Problem: RH PAIN MANAGEMENT Goal: RH STG PAIN MANAGED AT OR BELOW PT'S PAIN GOAL Description: Pain will be managed at or below 3 out of 10 on pain scale with min assist Outcome: Progressing   Problem: RH KNOWLEDGE DEFICIT Goal: RH STG INCREASE KNOWLEDGE OF STROKE PROPHYLAXIS Description: Pt will be able to verbalize medication used as stroke prophylaxis and verbalize precautions from handouts with min assist Outcome: Progressing

## 2020-12-23 NOTE — Progress Notes (Signed)
Speech Language Pathology Daily Session Note  Patient Details  Name: LOURDEZ MCGAHAN MRN: [id number] Date of Birth: 01-May-1951  Today's Date: 12/23/2020 SLP Individual Time: 0093-8182 SLP Individual Time Calculation (min): 43 min  Short Term Goals: Week 2: SLP Short Term Goal 1 (Week 2): Patient will participate in completing standardized cognitive testing SLP Short Term Goal 2 (Week 2): Patient will perform mildly complex function tasks (medication, money management, etc) with minA cues. SLP Short Term Goal 3 (Week 2): Patient will demonstrate awareness to errors and attempt self-correction during functional ADL's with minA cues. SLP Short Term Goal 4 (Week 2): Patient will return demonstrated learned exercises, strategies with minA cues. SLP Short Term Goal 5 (Week 2): Patient will complete mild complex organizational tasks with min-modA cues.  Skilled Therapeutic Interventions: Skilled SLP intervention focused on cognition. Pt completed pill sorting task. She demonstrated good recall of medications and times take from medication sheet. Min verbal cues to increase attention to where pills are added in to pill bottle she often began putting AM meds in PM side and needed visual cue to eliminate this error x2. Simple fill in the blank crossword puzzle with missing letters for types of drinks completed with min a verbal cues. Pt participated well in skilled SLP intervention. Less frustration this session when completing tasks. Cont therapy per plan of care.      Pain Pain Assessment Pain Scale: Faces Pain Score: 6  Faces Pain Scale: No hurt Pain Type: Chronic pain Pain Location: Neck Pain Descriptors / Indicators: Aching  Therapy/Group: Individual Therapy  Darrol Poke Nidal Rivet 12/23/2020, 11:52 AM

## 2020-12-23 NOTE — Progress Notes (Signed)
Occupational Therapy Session Note  Patient Details  Name: Carly [name] MRN: [id number] Date of Birth: [birth year]  Today's Date: 12/23/2020 OT Individual Time: 3729-0211 OT Individual Time Calculation (min): 92 min   Short Term Goals: Week 2:  OT Short Term Goal 1 (Week 2): Pt will complete self-feeding with supervision/setup with use of built up handle with <2 spills OT Short Term Goal 2 (Week 2): Pt will complete toilet transfer with mod assist stand vs squat pivot OT Short Term Goal 3 (Week 2): Pt will complete LB dressing with max assist of one caregiver  Skilled Therapeutic Interventions/Progress Updates:    Pt greeted in the recliner with no c/o pain, just nausea due to "nerves." Pt wanted to focus her OT session on handwriting. Extensive repetitive practice completed for pre-handwriting and handwriting skills. Pt provided with worksheets to continue practicing in the room after we started working on them together. She required encouragement, emotional support, and education regarding implementation of anxiety mgt strategies in order to maximize her participation and overall functional performance. Pt at times teary, expressed having negative self talk which limits her functional performance. We discussed at length using positive self talk strategies to address this therapeutically. We also discussed/practiced using aromatherapy interventions, music listening, and diaphragmatic breathing to increase pts self efficacy. Pt with multiple questions regarding anxiety mgt interventions, very collaborative with therapist throughout tx. She preferred at times to utilize a modified tripod grasp that more resembled 3 jaw chuck pattern due to improved legibility with this technique when writing in print. At end of session pt remained in the recliner, left with all needs within reach.   Therapy Documentation Precautions:  Precautions Precautions: Fall Precaution Comments: R hemi, has MS with  rigid LLE Restrictions Weight Bearing Restrictions: No Pain: Pain Assessment Pain Scale: Faces Pain Score: 6  Faces Pain Scale: No hurt Pain Type: Chronic pain Pain Location: Neck Pain Descriptors / Indicators: Aching ADL: ADL Upper Body Bathing: Minimal assistance Where Assessed-Upper Body Bathing: Edge of bed Lower Body Bathing: Maximal assistance Where Assessed-Lower Body Bathing: Edge of bed Upper Body Dressing: Moderate assistance Where Assessed-Upper Body Dressing: Edge of bed Lower Body Dressing: Dependent Where Assessed-Lower Body Dressing: Edge of bed Toileting: Dependent (Mod A sit > stand with Charlaine Dalton) Where Assessed-Toileting: Bedside Commode Toilet Transfer: Dependent Charlaine Dalton) Toilet Transfer Method:  Charlaine Dalton) Toilet Transfer Equipment: Bedside commode     Therapy/Group: Individual Therapy  Nyrie Sigal A Colden Samaras 12/23/2020, 3:02 PM

## 2020-12-24 LAB — BASIC METABOLIC PANEL
Anion gap: 8 (ref 5–15)
BUN: 13 mg/dL (ref 8–23)
CO2: 27 mmol/L (ref 22–32)
Calcium: 9.5 mg/dL (ref 8.9–10.3)
Chloride: 105 mmol/L (ref 98–111)
Creatinine, Ser: 0.77 mg/dL (ref 0.44–1.00)
GFR, Estimated: >60 mL/min (ref >60)
Glucose, Bld: 114 mg/dL — ABNORMAL HIGH (ref 70–99)
Potassium: 4.3 mmol/L (ref 3.5–5.1)
Sodium: 140 mmol/L (ref 135–145)

## 2020-12-24 LAB — CBC
HCT: 39 % (ref 36.0–46.0)
Hemoglobin: 12.9 g/dL (ref 12.0–15.0)
MCH: 28.2 pg (ref 26.0–34.0)
MCHC: 33.1 g/dL (ref 30.0–36.0)
MCV: 85.3 fL (ref 80.0–100.0)
Platelets: 297 10*3/uL (ref 150–400)
RBC: 4.57 MIL/uL (ref 3.87–5.11)
RDW: 14.5 % (ref 11.5–15.5)
WBC: 6.9 10*3/uL (ref 4.0–10.5)
nRBC: 0 % (ref 0.0–0.2)

## 2020-12-24 MED ORDER — QUETIAPINE FUMARATE 25 MG PO TABS
25.0000 mg | ORAL_TABLET | Freq: Every day | ORAL | Status: DC
  Administered 2020-12-24 – 2020-12-26 (×3): 25 mg via ORAL
  Filled 2020-12-24 (×3): qty 1

## 2020-12-24 MED ORDER — FUROSEMIDE 20 MG PO TABS
20.0000 mg | ORAL_TABLET | Freq: Every day | ORAL | Status: DC
  Administered 2020-12-24 – 2020-12-25 (×2): 20 mg via ORAL
  Filled 2020-12-24 (×2): qty 1

## 2020-12-24 NOTE — Plan of Care (Signed)
  Problem: RH Stairs Goal: LTG Patient will ambulate up and down stairs w/assist (PT) Description: LTG: Patient will ambulate up and down # of stairs with assistance (PT) Outcome: Not Applicable Flowsheets (Taken 12/24/2020 1219) LTG: Pt will ambulate up/down stairs assist needed:: (D/C) -- Note: D/C   Problem: RH Balance Goal: LTG Patient will maintain dynamic standing balance (PT) Description: LTG:  Patient will maintain dynamic standing balance with assistance during mobility activities (PT) Flowsheets (Taken 12/24/2020 1219) LTG: Pt will maintain dynamic standing balance during mobility activities with:: (downgraded due to poor motor planning/sequencing, generalized weakness, poor endurance) Minimal Assistance - Patient > 75% Note: downgraded due to poor motor planning/sequencing, generalized weakness, poor endurance   Problem: RH Car Transfers Goal: LTG Patient will perform car transfers with assist (PT) Description: LTG: Patient will perform car transfers with assistance (PT). Flowsheets (Taken 12/24/2020 1219) LTG: Pt will perform car transfers with assist:: (downgraded due to poor motor planning/sequencing, generalized weakness, poor endurance) Moderate Assistance - Patient 50 - 74% Note: downgraded due to poor motor planning/sequencing, generalized weakness, poor endurance   Problem: RH Ambulation Goal: LTG Patient will ambulate in controlled environment (PT) Description: LTG: Patient will ambulate in a controlled environment, # of feet with assistance (PT). Flowsheets (Taken 12/24/2020 1219) LTG: Pt will ambulate in controlled environ  assist needed:: (downgraded due to poor motor planning/sequencing, generalized weakness, poor endurance) Minimal Assistance - Patient > 75% LTG: Ambulation distance in controlled environment: 28ft Note: downgraded due to poor motor planning/sequencing, generalized weakness, poor endurance Goal: LTG Patient will ambulate in home environment  (PT) Description: LTG: Patient will ambulate in home environment, # of feet with assistance (PT). Flowsheets (Taken 12/24/2020 1219) LTG: Pt will ambulate in home environ  assist needed:: (downgraded due to poor motor planning/sequencing, generalized weakness, poor endurance) Minimal Assistance - Patient > 75% LTG: Ambulation distance in home environment: 56ft with LRAD Note: downgraded due to poor motor planning/sequencing, generalized weakness, poor endurance   Problem: RH Wheelchair Mobility Goal: LTG Patient will propel w/c in controlled environment (PT) Description: LTG: Patient will propel wheelchair in controlled environment, # of feet with assist (PT) Flowsheets (Taken 12/24/2020 1219) LTG: Pt will propel w/c in controlled environ  assist needed:: (downgraded due to poor motor planning/sequencing, generalized weakness, poor endurance) Supervision/Verbal cueing LTG: Propel w/c distance in controlled environment: 62ft Note: downgraded due to poor motor planning/sequencing, generalized weakness, poor endurance Goal: LTG Patient will propel w/c in home environment (PT) Description: LTG: Patient will propel wheelchair in home environment, # of feet with assistance (PT). Flowsheets (Taken 12/24/2020 1219) LTG: Pt will propel w/c in home environ  assist needed:: (downgraded due to poor motor planning/sequencing, generalized weakness, poor endurance) Supervision/Verbal cueing LTG: Propel w/c distance in home environment: 92ft Note: downgraded due to poor motor planning/sequencing, generalized weakness, poor endurance

## 2020-12-24 NOTE — Progress Notes (Signed)
PROGRESS NOTE   Subjective/Complaints: Patient's chart reviewed- No issues reported overnight Vitals signs stable except for elevated systolic BP. She complains of swelling in bilateral feet- she is wearing compression garments from home.   ROS: Denies CP, SOB, N/V/D, +bilateral feet swelling.   Objective:   No results found. Recent Labs    12/24/20 0452  WBC 6.9  HGB 12.9  HCT 39.0  PLT 297   Recent Labs    12/24/20 0452  NA 140  K 4.3  CL 105  CO2 27  GLUCOSE 114*  BUN 13  CREATININE 0.77  CALCIUM 9.5    Intake/Output Summary (Last 24 hours) at 12/24/2020 0826 Last data filed at 12/24/2020 0746 Gross per 24 hour  Intake 660 ml  Output --  Net 660 ml        Physical Exam: Vital Signs Blood pressure (!) 157/82, pulse 83, temperature 98.7 F (37.1 C), temperature source Oral, resp. rate 16, height 5\' 3"  (1.6 m), weight 78.4 kg, SpO2 96 %. Gen: no distress, normal appearing HEENT: oral mucosa pink and moist, NCAT Cardio: Reg rate Chest: normal effort, normal rate of breathing Abd: soft, non-distended Ext: no edema Psych: pleasant, normal affect Skin: intact Musc: No edema in extremities.  No tenderness in extremities. Neuro: Alert RUE/RLE: Grossly 5/5 proximal distal LUE: Grossly 4+/5 proximal distal LLE- HF 4+/5, KE 4/5, DF 2+/5  Assessment/Plan: 1. Functional deficits which require 3+ hours per day of interdisciplinary therapy in a comprehensive inpatient rehab setting.  Physiatrist is providing close team supervision and 24 hour management of active medical problems listed below.  Physiatrist and rehab team continue to assess barriers to discharge/monitor patient progress toward functional and medical goals  Care Tool:  Bathing    Body parts bathed by patient: Right arm,Chest,Abdomen,Face,Left arm,Front perineal area,Right upper leg,Left upper leg   Body parts bathed by helper: Left  arm,Buttocks,Right lower leg,Left lower leg     Bathing assist Assist Level: Moderate Assistance - Patient 50 - 74%     Upper Body Dressing/Undressing Upper body dressing   What is the patient wearing?: Pull over shirt,Bra    Upper body assist Assist Level: Minimal Assistance - Patient > 75%    Lower Body Dressing/Undressing Lower body dressing      What is the patient wearing?: Pants,Underwear/pull up     Lower body assist Assist for lower body dressing: Maximal Assistance - Patient 25 - 49% (with stedy)     Toileting Toileting    Toileting assist Assist for toileting: Moderate Assistance - Patient 50 - 74% (with stedy)     Transfers Chair/bed transfer  Transfers assist     Chair/bed transfer assist level: Moderate Assistance - Patient 50 - 74%     Locomotion Ambulation   Ambulation assist   Ambulation activity did not occur: Safety/medical concerns  Assist level: 2 helpers Assistive device: Walker-rolling Max distance: 64ft   Walk 10 feet activity   Assist  Walk 10 feet activity did not occur: Safety/medical concerns  Assist level: 2 helpers Assistive device: Walker-rolling   Walk 50 feet activity   Assist Walk 50 feet with 2 turns activity did not occur: Safety/medical  concerns         Walk 150 feet activity   Assist Walk 150 feet activity did not occur: Safety/medical concerns         Walk 10 feet on uneven surface  activity   Assist Walk 10 feet on uneven surfaces activity did not occur: Safety/medical concerns         Wheelchair     Assist Will patient use wheelchair at discharge?: Yes (Per PT long term goals) Type of Wheelchair: Manual    Wheelchair assist level: Minimal Assistance - Patient > 75% Max wheelchair distance: 150    Wheelchair 50 feet with 2 turns activity    Assist        Assist Level: Minimal Assistance - Patient > 75%   Wheelchair 150 feet activity     Assist      Assist  Level: Minimal Assistance - Patient > 75%   Blood pressure (!) 157/82, pulse 83, temperature 98.7 F (37.1 C), temperature source Oral, resp. rate 16, height 5\' 3"  (1.6 m), weight 78.4 kg, SpO2 96 %.  Medical Problem List and Plan: 1.  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  Antithrombotics: -DVT/anticoagulation:  Pharmaceutical: Continue Lovenox             -antiplatelet therapy: Plavix  3. Chronic LBP/Pain Management: Continue Tylenol daily, kpad ordered for right neck to relax the muscles.  4. Mood: Team to provide ego support.              -antipsychotic agents: N/A 5. Neuropsych: This patient is not fully capable of making decisions on her own behalf. 6. Skin/Wound Care: Routine pressure relief measures.  7. Fluids/Electrolytes/Nutrition: Monitor I/Os. Electrolytes reviewed and are stable.  8. H/o depression w/GAD: D/c Ativan. Cymbalta 60 mg and Remeron 45 mg/hs. Decrese Seroquel to 12.5mg  daily. Add Seroquel 25mg  HS.   Appears to be controlled on 3/12 9. MS: on no disease modifying agents; Continue Ampyra daily. Has some spasticity and L foot drop- might benefit from spasticity treatment, has L AFO  10. Dyslipidemia: Chol 243/LDL-143             Lipitor daily 11. Constipation  Improved 12, Cognitive decline: SLP ordered- counseling to patient and family regarding optimal nutrition to enhance cognition.  13.  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 Obesity (BMI 30.62): have provided dietary education to patient, her husband, and her daughter-in-law  40. Bilateral feet swelling: continue elevation and compression garments. Add Lasix 20mg  daily until swelling decreases. Cr normal.  16. HTN: up to 604V systolic today but stable prior, lasix should help.  17. Disposition: Discussed goals with husband: he was providing her significant cognitive and  physical support at home- helping her get to and from bathroom. Patient states she was wiping herself. Discussed d/c date of 3/17 with goals of improved ambulation in home with as little support from husband as possible- he will hire additional days of health aide (3 to 5, potentially overnight as well)., and improved right arm function. Long term he is looking for facility for them both to reside. Discussed plan for outpatient follow-up. Discussed plan for d/c with therapy team.    LOS: 11 days A FACE TO FACE EVALUATION WAS PERFORMED  Carly [name] 12/24/2020, 8:26 AM

## 2020-12-24 NOTE — Progress Notes (Signed)
Occupational Therapy Session Note  Patient Details  Name: Carly [name] MRN: [id number] Date of Birth: 02-10-1951  Today's Date: 12/24/2020 OT Individual Time: 2426-8341 OT Individual Time Calculation (min): 60 min    Short Term Goals: Week 2:  OT Short Term Goal 1 (Week 2): Pt will complete self-feeding with supervision/setup with use of built up handle with <2 spills OT Short Term Goal 2 (Week 2): Pt will complete toilet transfer with mod assist stand vs squat pivot OT Short Term Goal 3 (Week 2): Pt will complete LB dressing with max assist of one caregiver  Skilled Therapeutic Interventions/Progress Updates:    Treatment session with focus on self-care retraining and functional use of dominant RUE>  Pt received upright in bed working on fine motor control and strengthening activities with theraputty.  Pt agreeable to therapy session, requesting to wash hair this session.  Completed bed mobility with mod assist due to heavy R lean.  Pt reports need to toilet.  Completed sit > stand in [place] with CGA.  Pt able to maintain standing with alternating UE support on Stedy and CGA to complete clothing management pre and post toileting.  Pt transferred via Stedy to w/c to complete grooming tasks at sink.  Pt required setup for oral care but then able to complete with use of RUE 75% of task.  Therapist assisted with washing hair with hair washing tray at sink.  Pt then utilized RUE with hair crush and hair dryer when styling hair.  Pt continues to demonstrate minimally decreased coordination with more gross motor movements.  Completed stand pivot transfer without AD with min-mod assist and mod cues for sequencing.  Pt remained upright in recliner with legs elevated, chair alarm on, and all needs in reach.  Therapy Documentation Precautions:  Precautions Precautions: Fall Precaution Comments: R hemi, has MS with rigid LLE Restrictions Weight Bearing Restrictions: No Pain: Pain Assessment Pain  Scale: 0-10 Pain Score: 0-No pain ADL: ADL Upper Body Bathing: Minimal assistance Where Assessed-Upper Body Bathing: Edge of bed Lower Body Bathing: Maximal assistance Where Assessed-Lower Body Bathing: Edge of bed Upper Body Dressing: Moderate assistance Where Assessed-Upper Body Dressing: Edge of bed Lower Body Dressing: Dependent Where Assessed-Lower Body Dressing: Edge of bed Toileting: Dependent (Mod A sit > stand with Charlaine Dalton) Where Assessed-Toileting: Bedside Commode Toilet Transfer: Dependent Customer service manager) Toilet Transfer Method:  Charlaine Dalton) Science writer: Forensic psychologist   Exercises:   Other Treatments:     Therapy/Group: Individual Therapy  Simonne Come 12/24/2020, 8:35 AM

## 2020-12-24 NOTE — Progress Notes (Signed)
Occupational Therapy Session Note  Patient Details  Name: Carly [name] MRN: [id number] Date of Birth: 07/12/1951  Today's Date: 12/24/2020 OT Individual Time: 1501-1526 OT Individual Time Calculation (min): 25 min    Short Term Goals: Week 1:  OT Short Term Goal 1 (Week 1): Pt will complete sit > stand with max assist of one caregiver to progress towards standing for LB self-care tasks OT Short Term Goal 1 - Progress (Week 1): Met OT Short Term Goal 2 (Week 1): Pt will complete LB dressing with max assist of one caregiver OT Short Term Goal 2 - Progress (Week 1): Progressing toward goal OT Short Term Goal 3 (Week 1): Pt will complete toilet transfer with mod assist stand vs squat pivot OT Short Term Goal 3 - Progress (Week 1): Progressing toward goal OT Short Term Goal 4 (Week 1): Pt will complete bathing with mod assist OT Short Term Goal 4 - Progress (Week 1): Met Week 2:  OT Short Term Goal 1 (Week 2): Pt will complete self-feeding with supervision/setup with use of built up handle with <2 spills OT Short Term Goal 2 (Week 2): Pt will complete toilet transfer with mod assist stand vs squat pivot OT Short Term Goal 3 (Week 2): Pt will complete LB dressing with max assist of one caregiver   Skilled Therapeutic Interventions/Progress Updates:    Pt greeted at time of session sitting up in recliner agreeable to OT session, no pain. 1x10 wheelchair/recliner push ups with multimodal cues for form for tricep strengthening. Agreeable to trial sit <> stands, completed with Mod A first round and Min A second trial at RW, cues for hand placement and sequencing. Difficulty following directions noted. Static standing in front of mirror for feedback for upright posture and decreasing mild R lean. Note pt fatigued and requested to work on something else, wanting to work on handwriting. GMC activity performed first by reaching for target in room held out by therapist using RUE only to touch item  and back to face to simulate self feeding tasks and work on GM/FMC. Provided with FMC activities for handwriting and performed 1st row of letters but became frustrated, encouraged to take a break for optimal performance and resume in 30 minutes after a break.   Therapy Documentation Precautions:  Precautions Precautions: Fall Precaution Comments: R hemi, has MS with rigid LLE Restrictions Weight Bearing Restrictions: No     Therapy/Group: Individual Therapy   C  12/24/2020, 12:51 PM 

## 2020-12-24 NOTE — Progress Notes (Signed)
Physical Therapy Session Note  Patient Details  Name: Carly [name] MRN: [id number] Date of Birth: 10-05-1951  Today's Date: 12/24/2020 PT Individual Time: 1100-1154 PT Individual Time Calculation (min): 54 min   Short Term Goals: Week 1:  PT Short Term Goal 1 (Week 1): Pt will perform bed mobility with min assist and use of bed features as needed PT Short Term Goal 1 - Progress (Week 1): Progressing toward goal PT Short Term Goal 2 (Week 1): Pt will perform stand pivot transfer to [facility] with mod assist consistently PT Short Term Goal 2 - Progress (Week 1): Progressing toward goal PT Short Term Goal 3 (Week 1): Pt wil ambulate 67ft with mod assist and LRAD PT Short Term Goal 3 - Progress (Week 1): Progressing toward goal PT Short Term Goal 4 (Week 1): Pt will propell WC 152ft with supervision assist PT Short Term Goal 4 - Progress (Week 1): Progressing toward goal Week 2:  PT Short Term Goal 1 (Week 2): Pt will perform stand pivot transfer to [facility] with mod assist consistently PT Short Term Goal 2 (Week 2): Pt wil ambulate 27ft with mod assist and LRAD PT Short Term Goal 3 (Week 2): Pt will initiate stair training  Skilled Therapeutic Interventions/Progress Updates:   Received pt sitting in recliner, pt agreeable to therapy, and denied any pain during session but requested Tylenol prior to therapy to prevent onset of neck pain. RN notified and present to administer medication during session. Session with emphasis on functional mobility/transfers, toileting, generalized strengthening, dynamic standing balance/coordination, NMR, motor planning/sequencing, gait training, and improved activity tolerance. Pt reported urge to urinate and transferred sit<>stand in [place] with supervision and transferred to toilet. Pt able to manage clothing with CGA and able to void and perform peri-care with supervision. Pt washed hands at sink sitting in [place] pads with supervision. Worked on bed mobility as pt  reports she has a regular bed without rails. Pt transferred sit<>supine with supervision using bedrails, rolled L and R with supervision and use of bedrails, but required min A to transfer supine<>sit and pt with mild R lateral lean. Donned L AFO and shoes with max A and took measurements to fit pt for 18x18 manual WC. Pt performed WC mobility 85ft using BUE and supervision with significantly increased time and max verbal and tactile cues for hand placement, sequencing, and propulsion stride technique. Pt with increased difficulty motor planning frequently rolling backwards and with tendency to steer to R due to RUE weakness. Pt with increased frustration with task and therapist reminded pt of pursed lip breathing techniques. Pt then ambulated 30ft with RW and mod A with 1 turn with manual facilitation to steer RW. Pt continues to require cues for stepping sequence, upright posture/gaze, RW management, and turning technique. Pt agreed to stay sitting in WC to continue practice WC mobility. Concluded session with pt sitting in WC, needs within reach, and chair pad alarm on.   Therapy Documentation Precautions:  Precautions Precautions: Fall Precaution Comments: R hemi, has MS with rigid LLE Restrictions Weight Bearing Restrictions: No  Therapy/Group: Individual Therapy Alfonse Alpers PT, DPT   12/24/2020, 7:22 AM

## 2020-12-24 NOTE — Progress Notes (Signed)
Speech Language Pathology Daily Session Note  Patient Details  Name: Carly [name] MRN: [id number] Date of Birth: 1951-05-19  Today's Date: 12/24/2020 SLP Individual Time: 18-1445 SLP Individual Time Calculation (min): 60 min  Short Term Goals: Week 2: SLP Short Term Goal 1 (Week 2): Patient will participate in completing standardized cognitive testing SLP Short Term Goal 1 - Progress (Week 2): Met SLP Short Term Goal 2 (Week 2): Patient will perform mildly complex function tasks (medication, money management, etc) with minA cues. SLP Short Term Goal 3 (Week 2): Patient will demonstrate awareness to errors and attempt self-correction during functional ADL's with minA cues. SLP Short Term Goal 4 (Week 2): Patient will return demonstrated learned exercises, strategies with minA cues. SLP Short Term Goal 5 (Week 2): Patient will complete mild complex organizational tasks with min-modA cues.  Skilled Therapeutic Interventions:Skilled ST services focused on cognitive skills. SLP administered formal cognitive linguistic assessment SLUMS, pt scored 11 out 30, 3 points lower than on evaluation, however SLP suggests performance impacted by anxeity. Pt continues to demonstrate deficits in short term recall, sustained/selective attention, basic problem solving and error awareness. SLP facilitated basic and mildly complex problem solving skills with novel card task Blink, pt demonstrated Mod I for basic problem solving and mod A fade to min A for mildly complex problem solving. Pt's skills were further impacted by attention and recall deficits. Pt was left in room with call bell within reach and chair alarm set. SLP recommends to continue skilled services.       Pain Pain Assessment Pain Scale: 0-10 Pain Score: 0-No pain Faces Pain Scale: Hurts little more Pain Type: Acute pain Pain Location: Head Pain Orientation: Mid Pain Descriptors / Indicators: Aching Pain Frequency: Constant Pain  Onset: On-going Patients Stated Pain Goal: 2 Pain Intervention(s): Medication (See eMAR)  Therapy/Group: Individual Therapy  Vaughan Garfinkle  [facility] 12/24/2020, 2:45 PM

## 2020-12-24 NOTE — Plan of Care (Signed)
  Problem: RH Bathing Goal: LTG Patient will bathe all body parts with assist levels (OT) Description: LTG: Patient will bathe all body parts with assist levels (OT) Flowsheets (Taken 12/24/2020 0914) LTG: Pt will perform bathing with assistance level/cueing: (downgraded) Moderate Assistance - Patient 50 - 74% Note: Downgraded as pt received ~mod assist at baseline   Problem: RH Tub/Shower Transfers Goal: LTG Patient will perform tub/shower transfers w/assist (OT) Description: LTG: Patient will perform tub/shower transfers with assist, with/without cues using equipment (OT) Flowsheets (Taken 12/24/2020 0914) LTG: Pt will perform tub/shower stall transfers with assistance level of: (downgraded) Moderate Assistance - Patient 50 - 74% Note: Downgraded as pt received ~mod assist at baseline

## 2020-12-24 NOTE — Progress Notes (Signed)
Occupational Therapy Note  Patient Details  Name: Carly [name] MRN: [id number] Date of Birth: 27-Jan-1951  Today's Date: 12/24/2020 OT Individual Time: 1315-1345 OT Individual Time Calculation (min): 30 min   Treatment session with focus on RUE fine motor control and pre-writing activities.  Pt received supine in bed agreeable to therapy session.  Pt completed bed mobility with mod assist to come to sitting EOB.  Completed stand pivot transfer bed > w/c with mod assist with cues for sequencing.  Engaged in [place] fine motor tasks sitting upright in recliner starting with pinch and pull activity progressing to horizontal, vertical, and diagonal strokes.  Pt with improved strokes with thicker pen to allow for better grip. Progressed to curved strokes and pt able to print name with fair success.  Pt verbalizing frustration but noting improvements.  Provided with additional handouts for pt to complete with husband.  Pt passed off to SLP.   Simonne Come 12/24/2020, 3:13 PM

## 2020-12-24 NOTE — Consult Note (Signed)
Neuropsychological Consultation   Patient:   Carly [name]   DOB:   [birth year]  MR Number:  [id number]  Location:  [facility] [id number] [place] [address] Dept: [place]: [id number]           Date of Service:   12/24/2020  Start Time:   9:15 AM End Time:   10:15 AM  Provider/Observer:  Ilean Skill, Psy.D.       Clinical Neuropsychologist       Billing Code/Service: [phone]  Chief Complaint:    TNIA ANGLADA is a 70 year old female with history of MS with lower left extremity weakness and gait disorder, GERD, anxiety/depression, headache.  Patient was admitted on 12/08/2020 with a 1 day history of weakness with difficulty standing, dizziness, mental status changes and difficulty picking up items with right hand.  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  MRI brain was negative for stenosis or LVO but showed diffuse atherosclerotic irregularity most severe in PCA branches.  Neurology felt that stroke was due to small vessel disease.  Psychiatry was initially consulted with assistance due to management of chronic generalized anxiety disorder with bouts of lability.  Patient with chronic left-sided weakness potentially due to MS and now right-sided weakness affecting balance.  Patient has right lateral lean with left knee buckling and initially reduced information processing speed and difficulties with memory recall versus inability to store new information.  Reason for Service:  Patient was referred for neuropsychological consultation due to coping and adjustment in a setting of premorbid history of anxiety and depression and MS.  Below is the HPI for the current admission.  HPI:  Carly [name] is a 70 year old female with history of MS with LLE weakness--on Ampyra for gait  disorder, GERD, anxiety/depression, HA who was admitted on 12/08/20 with one day history of weakness with difficulty standing, dizziness, mental status changes and difficulty picking items with right hand. 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 MRA brain negative for stenosis or LVO but showed diffuse atherosclerotic irregularity --most severe in PCA branches.  2D echo showed EF 60-65% with no wall abnormalities.  Carotid dopplers negative for ICA stenosis. Neurology felt that stroke was due to small vessel disease and Plavix added. Psychiatry consulted for assistance with management of acute on chronic GAD with bouts of lability and recommended increasing mirtazapine to 45 mg/hs as well as follow up with therapist for counseling after d/c.    Patient with chronic left sided weakness and now with right sided weakness affecting balance, has right lateral lean with left knee bucking, needs increased time to processing as well as intermittent problems with recall. Therapy ongoing and CIR recommended due to functional decline.   Current Status:  The patient was awake and alert and well-oriented.  She acknowledged some residual slowing in information processing speed and retrieval of information type symptoms although these are all improving.  Patient reports she continues to have sensory changes with her right hand.  Patient notes that this was the primary symptom that she was experiencing and given her previous left-sided changes from MS it did not particularly signaled to her that she was having a stroke.  Patient was aware of high blood pressure but had not been  started on any type of blood pressure or blood thinning agents previously.  Patient has a lot of anxiety that is premorbid but reports that while initially she was very anxious about residual effects of her stroke the anxiety is come back to  baseline.  Patient denies any severe depression.  Patient denies that her anxiety or depression are negatively impacting her efforts with therapeutic interventions.  Behavioral Observation: [name]  presents as a 70 y.o.-year-old Right Caucasian Female who appeared her stated age. her dress was Appropriate and she was Well Groomed and her manners were Appropriate to the situation.  her participation was indicative of Appropriate and Redirectable behaviors.  There were physical disabilities noted.  she displayed an appropriate level of cooperation and motivation.     Interactions:    Active Appropriate and Redirectable  Attention:   abnormal and attention span appeared shorter than expected for age  Memory:   abnormal; remote memory intact, recent memory impaired  Visuo-spatial:  not examined  Speech (Volume):  normal  Speech:   normal; normal  Thought Process:  Coherent and Relevant  Though Content:  WNL; not suicidal and not homicidal  Orientation:   person, place, time/date and situation  Judgment:   Fair  Planning:   Poor  Affect:    Anxious  Mood:    Anxious and Dysphoric  Insight:   Good  Intelligence:   normal  Medical History:   Past Medical History:  Diagnosis Date  . Allergy   . Anxiety   . Aspiration pneumonia ([place])   . Depression   . Frequent headaches    H/O  . GERD (gastroesophageal reflux disease)   . History of chicken pox   . History of colon polyps   . Hx of migraines   . Multiple sclerosis ([place])   . PONV (postoperative nausea and vomiting)          Patient Active Problem List   Diagnosis Date Noted  . PBA (pseudobulbar affect)   . MS (multiple sclerosis) ([place])   . Anxiety and depression   . 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 Generalized anxiety disorder 12/11/2020  . Acute focal neurological deficit 12/08/2020  . Edema 09/28/2020  . Bloating 08/25/2020  . Environmental allergies 07/17/2019  .  Hyperglycemia 07/17/2019  . Cough 06/06/2019  . Memory change 05/29/2019  . Abnormal liver function tests 01/09/2019  . Hemorrhoid 09/27/2018  . Nausea 08/11/2018  . 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 Depression with anxiety 02/04/2018  . Cognitive and behavioral changes 02/04/2018  . Constipation 11/17/2017  . Hx of adenomatous colonic polyps 06/06/2016  . Lower extremity edema 01/13/2016  . Bilateral ovarian cysts 10/16/2015  . Health care maintenance 10/16/2015  . Colon polyp 01/25/2015  . Bone/cartilage disorder 01/25/2015  . Headache 11/18/2014  . Depression, recurrent ([place]) 11/18/2014  . Greater tuberosity of humerus fracture 11/18/2014  . Multiple sclerosis ([place]) 11/18/2014  . Unsteady gait 11/18/2014  . Dizziness 11/18/2014  . Inconclusive mammogram 03/15/2013  . S/P lumbar spinal fusion 01/04/2013  . Surgery, other elective 12/03/2012  . DDD (degenerative disc disease), lumbosacral 10/15/2012  . Back ache 09/06/2012  . BP (high blood pressure) 09/06/2012  . Back pain 09/06/2012  . Hypercholesterolemia 01/28/2012  . Climacteric 01/28/2012  . Routine general medical examination at a health care facility 01/28/2012  . Avitaminosis D 01/28/2012   Psychiatric History:  Patient with pre-existing diagnosis of generalized anxiety disorder and depression and is continuing on her home  medicines including Cymbalta, Ativan, Remeron for sleep.  Patient has been started on trazodone and Seroquel at night to aid with sleep disturbance.  She is on a very low dose of Seroquel 12.5 mg to help with sleep onset.  Family Med/Psych History:  Family History  Problem Relation Age of Onset  . Arthritis Mother   . Hypertension Mother   . Macular degeneration Mother   . Hypertension Father   . Hyperlipidemia Father   . Heart disease Maternal Grandfather   . Diabetes Maternal Grandfather   . Kidney disease Paternal Grandmother     Impression/DX:  BELENDA ALVIAR is a 70 year old  female with history of MS with lower left extremity weakness and gait disorder, GERD, anxiety/depression, headache.  Patient was admitted on 12/08/2020 with a 1 day history of weakness with difficulty standing, dizziness, mental status changes and difficulty picking up items with right hand.  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  MRI brain was negative for stenosis or LVO but showed diffuse atherosclerotic irregularity most severe in PCA branches.  Neurology felt that stroke was due to small vessel disease.  Psychiatry was initially consulted with assistance due to management of chronic generalized anxiety disorder with bouts of lability.  Patient with chronic left-sided weakness potentially due to MS and now right-sided weakness affecting balance.  Patient has right lateral lean with left knee buckling and initially reduced information processing speed and difficulties with memory recall versus inability to store new information.  The patient was awake and alert and well-oriented.  She acknowledged some residual slowing in information processing speed and retrieval of information type symptoms although these are all improving.  Patient reports she continues to have sensory changes with her right hand.  Patient notes that this was the primary symptom that she was experiencing and given her previous left-sided changes from MS it did not particularly signaled to her that she was having a stroke.  Patient was aware of high blood pressure but had not been started on any type of blood pressure or blood thinning agents previously.  Patient has a lot of anxiety that is premorbid but reports that while initially she was very anxious about residual effects of her stroke the anxiety is come back to baseline.  Patient denies any severe depression.  Patient denies that her anxiety or depression are negatively impacting her efforts  with therapeutic interventions.  Disposition/Plan:  Worked on coping and adjustment issues and reviewed issues related to anxiety and depression.        Electronically Signed   _______________________ Ilean Skill, Psy.D. Clinical Neuropsychologist

## 2020-12-25 ENCOUNTER — Inpatient Hospital Stay (HOSPITAL_COMMUNITY): Payer: Medicare Other

## 2020-12-25 DIAGNOSIS — M7989 Other specified soft tissue disorders: Secondary | ICD-10-CM

## 2020-12-25 MED ORDER — FUROSEMIDE 40 MG PO TABS
40.0000 mg | ORAL_TABLET | Freq: Every day | ORAL | Status: DC
  Administered 2020-12-26 – 2020-12-27 (×2): 40 mg via ORAL
  Filled 2020-12-25 (×2): qty 1

## 2020-12-25 NOTE — Progress Notes (Signed)
Physical Therapy Session Note  Patient Details  Name: Carly [name] MRN: [id number] Date of Birth: [birth year]  Today's Date: 12/25/2020 PT Individual Time: 1000-1043 PT Individual Time Calculation (min): 43 min  Today's Date: 12/25/2020 PT Missed Time: 17 Minutes Missed Time Reason: Other (Comment) (scan of LLE)  Short Term Goals: Week 1:  PT Short Term Goal 1 (Week 1): Pt will perform bed mobility with min assist and use of bed features as needed PT Short Term Goal 1 - Progress (Week 1): Progressing toward goal PT Short Term Goal 2 (Week 1): Pt will perform stand pivot transfer to [facility] with mod assist consistently PT Short Term Goal 2 - Progress (Week 1): Progressing toward goal PT Short Term Goal 3 (Week 1): Pt wil ambulate 46ft with mod assist and LRAD PT Short Term Goal 3 - Progress (Week 1): Progressing toward goal PT Short Term Goal 4 (Week 1): Pt will propell WC 143ft with supervision assist PT Short Term Goal 4 - Progress (Week 1): Progressing toward goal Week 2:  PT Short Term Goal 1 (Week 2): Pt will perform stand pivot transfer to [facility] with mod assist consistently PT Short Term Goal 2 (Week 2): Pt wil ambulate 86ft with mod assist and LRAD PT Short Term Goal 3 (Week 2): Pt will initiate stair training  Skilled Therapeutic Interventions/Progress Updates:   Received pt sitting in recliner, pt agreeable to therapy, and denied any pain during session. RN present to administer medication. Session with emphasis on discharge planning, functional mobility/transfers, toileting, generalized strengthening, dynamic standing balance/coordination, simulated car transfers, NMR, motor sequencing, and improved activity tolerance. Pt reported urge to toilet and transferred sit<>stand in [place] with supervision and transported to toilet dependently. Pt able to doff brief/pants with CGA and void. Performed peri-care with supervision and required mod A to pull pants over hips. Pt sat in [place] and  washed hands at sink with supervision. Donned L AFO and transported downstairs to 94M ortho gym in [facility] total A for time management purposes and performed simulated car transfer squat<>pivot without AD and heavy mod A with cues for turning technique and sequencing. Pt required max cues for sequencing to scoot hips back and total A for LE management due to increase in LLE extensor tone. Pt performed WC mobility 25ft using BUE and supervision with max cues for sequencing and technique as pt with poor motor planning and pushing harder with LUE>RUE. Pt transported back to room in [facility] total A and vascular present requesting scan of pt's LLE. Pt transferred WC<>bed squat<>pivot with mod A and sit<>supine with mod A for LE management due to time constraints. Doffed L AFO with total A.  Concluded session with pt supine in bed receiving scan of LLE. 17 minutes missed of skilled physical therapy due to scan.   Therapy Documentation Precautions:  Precautions Precautions: Fall Precaution Comments: R hemi, has MS with rigid LLE Restrictions Weight Bearing Restrictions: No  Therapy/Group: Individual Therapy Alfonse Alpers PT, DPT   12/25/2020, 7:27 AM

## 2020-12-25 NOTE — Progress Notes (Signed)
Physical Therapy Session Note  Patient Details  Name: Carly [name] MRN: [id number] Date of Birth: 1951-01-12  Today's Date: 12/25/2020 PT Individual Time: 0900-0930 PT Individual Time Calculation (min): 30 min   Short Term Goals: Week 2:  PT Short Term Goal 1 (Week 2): Pt will perform stand pivot transfer to [facility] with mod assist consistently PT Short Term Goal 2 (Week 2): Pt wil ambulate 12ft with mod assist and LRAD PT Short Term Goal 3 (Week 2): Pt will initiate stair training  Skilled Therapeutic Interventions/Progress Updates:    Patient in supine and reports fatigued and disorganized.  Feels stuffy from allergies, but MD to order some Robatussin.  Applied compression socks in supine total A.  Patient requesting to toilet so performed supine to sit with close S increased time and HOB elevated with cues for finally achieving upright.  Donned shoes total A in sitting.  Performed sit to stand to [place] S and transported via [place] to toilet.  Min A with clothing management.  Patient toileted and completed hygiene with CGA for sit to stand.  Patient assisted to EOB via [place].  Performed sit to stand to RW min A with multiple cues for foot position, anterior weight shift and hand placement as walker tipping back, stand step to recliner mod A for steadying, cues for knee extension, assist for balance and walker management.  Performed back to bed then finally back to recliner with similar cues and assist.  PAtient left with legs elevated in recliner and call bell in reach.  Therapy Documentation Precautions:  Precautions Precautions: Fall Precaution Comments: R hemi, has MS with rigid LLE; pt has L AFO Restrictions Weight Bearing Restrictions: No  Pain: Pain Assessment Pain Score: 4  Pain Type: Chronic pain Pain Location: Neck Pain Descriptors / Indicators: Discomfort Pain Onset: On-going Pain Intervention(s): RN made aware   Therapy/Group: Individual Therapy  Reginia Naas  Magda Kiel, PT 12/25/2020, 9:17 AM

## 2020-12-25 NOTE — Progress Notes (Signed)
Speech Language Pathology Daily Session Note  Patient Details  Name: YEMAYA BARNIER MRN: [id number] Date of Birth: 01-10-1951  Today's Date: 12/25/2020 SLP Individual Time: 0835-0900 SLP Individual Time Calculation (min): 25 min  Short Term Goals: Week 2: SLP Short Term Goal 1 (Week 2): Patient will participate in completing standardized cognitive testing SLP Short Term Goal 1 - Progress (Week 2): Met SLP Short Term Goal 2 (Week 2): Patient will perform mildly complex function tasks (medication, money management, etc) with minA cues. SLP Short Term Goal 3 (Week 2): Patient will demonstrate awareness to errors and attempt self-correction during functional ADL's with minA cues. SLP Short Term Goal 4 (Week 2): Patient will return demonstrated learned exercises, strategies with minA cues. SLP Short Term Goal 5 (Week 2): Patient will complete mild complex organizational tasks with min-modA cues.  Skilled Therapeutic Interventions:Skilled ST services focused on cognitive skills. Pt demonstrated recall of yesterday's ST session, recalled name of card task and 2 out 3 rules. SLP facilitated mildly complex problem solving skills in calendar task, pt dictated while SLP recorded due to poor witting skills. Pt required mod A fade to min A verbal cues to include all important infomation. Pt was left in room with call bell within reach and bed alarm set. SLP recommends to continue skilled services.     Pain Pain Assessment Pain Scale: 0-10 Pain Score: 0-No pain Pain Type: Chronic pain Pain Location: Neck Pain Descriptors / Indicators: Aching Pain Frequency: Constant Pain Onset: On-going Patients Stated Pain Goal: 0 Pain Intervention(s): Medication (See eMAR)  Therapy/Group: Individual Therapy  Verlie Liotta  [facility] 12/25/2020, 9:48 AM

## 2020-12-25 NOTE — Patient Care Conference (Signed)
Inpatient RehabilitationTeam Conference and Plan of Care Update Date: 12/25/2020   Time: 9:53 AM    Patient Name: Carly [name]      Medical Record Number: [id number]  Date of Birth: 01-08-1951 Sex: Female         Room/Bed: 5C07C/5C07C-01 Payor Info: Payor: MEDICARE / Plan: MEDICARE PART A AND B / Product Type: *No Product type* /    Admit Date/Time:  12/13/2020 12:59 PM  Primary Diagnosis:  White matter periventricular infarction [facility])  Hospital Problems: Principal Problem:   White matter periventricular infarction [facility]) Active Problems:   PBA (pseudobulbar affect)   MS (multiple sclerosis) ([place])   Anxiety and depression    Expected Discharge Date: Expected Discharge Date: 12/27/20  Team Members Present: Physician leading conference: Dr. Leeroy Cha Care Coodinator Present: Dorthula Nettles, RN, BSN, CRRN;Becky Dupree, LCSW Nurse Present: Mohammed Kindle, RN PT Present: Becky Sax, PT OT Present: Simonne Come, OT PPS Coordinator present : Gunnar Fusi, SLP     Current Status/Progress Goal Weekly Team Focus  Bowel/Bladder   Pt continent of B/B. Last BM 3/11. Pt concerned for no BM although taking PRN medications to aid in contispation  Pr remains continent in B/B and consistently has BM during stay.  Continue to follow Bowel regiment and toliet pt q2h anf PRN   Swallow/Nutrition/ Hydration             ADL's   Min-mod assist stand pivot without AD, Mod assist sit >stand, Min assist UB dressing, Max-total assist LB dressing.  Pt anxiety improving but requires cues and reassurance.  Min assist toilet transfers and UB dressing; Mod assist LB dressing, toileting, and tub/shower transfers, Supervision grooming and feeding  ADL retraining, transfers, sit > stand, functional use of RUE, self-feeding   Mobility   bed mobility mod A, sit<>stand with RW min A, stand<>pivot transfers mod A with max cues, gait 58ft with RW mod A  min A, supervision WC mobility  functional  mobility/transfers, safety awareness, generalized strengthening, dynamic standing balance/coordination, ambulation, and endurance   Communication             Safety/Cognition/ Behavioral Observations  Mod-Min A, mostly Min A  Min-Supervision A  education, midly complex problem solving, recall, attention   Pain   Pt complains of minor H/A pain periodically treated with tylenol  Pt remains free of pain or below 3 out of 10 on numeric pain scale  Assess pain q4H and provide PRN medication as need   Skin   Skin intact free of injuries  Skin remains intact  Assess skin Qshift and follow skin care regiment     Discharge Planning:  Will schedule family education with husband and possibly caregiver in anticipation of Dc Thursday. Pt will continue to require 24/7 physical care   Team Discussion: Changing HS Ativan to Seroquel, increasing Lasix to 40 mg, vascular US of LE to be completed today, will repeat Cr tomorrow. Patient asking for medication for anxiety, continent B/B, only has minor complaints of pain, give Tylenol when needed. Discharge home with husband. Patient on target to meet rehab goals: Min assist for bed mobility, sit to stand is min to mod assist, max cues for ambulation, walking up to 50 ft. Will need a RW and 18x18 WC. Mod assist sit to stand, min assist for upper body bathing and dressing, max assist for lower body bathing and dressing. Downgrading some goals, RUE has some improving function. SLP is min assist. Working on mildly complex problem  solving, memory and error awareness, and pill box.  *See Care Plan and progress notes for long and short-term goals.   Revisions to Treatment Plan:  Changed HS Ativan to Seroquel, Increased Lasix, therapy downgrading some goals.  Teaching Needs: Family educations, medication management, pain management, skin/wound care, transfer training, gait training, endurance training, balance training.  Current Barriers to Discharge: Inaccessible  home environment, Decreased caregiver support, Medical stability, Home enviroment access/layout, Lack of/limited family support, Medication compliance and Behavior  Possible Resolutions to Barriers: Continue current medications, provide emotional support.     Medical Summary Current Status: left lower extremity swelling, anxiety is improving, neck pain  Barriers to Discharge: Medical stability  Barriers to Discharge Comments: left lower extremity swelling, anxiety is improving, neck pain, fall risk Possible Resolutions to [employer] Focus: increase lasix to 40mg , vascular US of LE today, repeat Cr tomorrow, continue tylenol and heating pad, change HS ativan to seroquel, continue Seroquel   Continued Need for Acute Rehabilitation Level of Care: The patient requires daily medical management by a physician with specialized training in physical medicine and rehabilitation for the following reasons: Direction of a multidisciplinary physical rehabilitation program to maximize functional independence : Yes Medical management of patient stability for increased activity during participation in an intensive rehabilitation regime.: Yes Analysis of laboratory values and/or radiology reports with any subsequent need for medication adjustment and/or medical intervention. : Yes   I attest that I was present, lead the team conference, and concur with the assessment and plan of the team.   Cristi Loron 12/25/2020, 12:44 PM

## 2020-12-25 NOTE — Progress Notes (Signed)
Speech Language Pathology Discharge Summary  Patient Details  Name: Carly [name] MRN: [id number] Date of Birth: 03/15/1951  Today's Date: 12/26/2020 SLP Individual Time: 1100-1200 SLP Individual Time Calculation (min): 60 min   Skilled Therapeutic Interventions:  Patient seen with spouse and family friend/aid present for education. Patient was a little anxious but likely getting overwhelmed by all the family education that has been happening today. Patient was confused, such as at one point saying, "I worked with a guy named Jenny Reichmann" and when SLP said, "that is me" she said "I know you are the Jenny Reichmann I am talking about". Patient's husband and friend/aid were both very receptive to discussing importance of: organization, routines, setting aside designated times for working on therapy tasks/exercises at home, etc. Patient very concerned and rather fixated on poor fine motor control with dominant hand (right). All questions answered and patient is ready for discharge from CIR level SLP intervention.   Patient has met 5 of 5 long term goals.  Patient to discharge at overall Supervision;Min level.  Reasons goals not met:     Clinical Impression/Discharge Summary:   Pt made goo progress meeting 5 out 5 goals discharging at min-supervision A for cognitive skills. Cognitive skills are further impacted by anxiety, however pt demonstrated greater control in sustained and selective attention throughout stay at CIR. SLP facilitated basic to mildly complex problem solving skills, error awareness, anticipatory awareness, daily recall and recall within task during function tasks such as medication and time management. Education was completed with husband.Marland KitchenMarland KitchenPt benefited from skilled ST services in order to maximize functional independence and reduce burden of care, requiring supervision at discharge with continued outpatient skilled ST services.  Care Partner:        Recommendation:  Outpatient SLP   Rationale for SLP Follow Up: Maximize cognitive function and independence;Reduce caregiver burden   Equipment: N/A   Reasons for discharge: Discharged from hospital   Patient/Family Agrees with Progress Made and Goals Achieved: Yes    [place]  [facility] 12/25/2020, 9:56 AM   Sonia Baller, MA, CCC-SLP Speech Therapy

## 2020-12-25 NOTE — Progress Notes (Signed)
PROGRESS NOTE   Subjective/Complaints: Left lower swelling continues- not painful. Happens at times at home. Discussed increasing Lasix, will recheck Cr tomorrow, discussed getting ultrasound to rule out clot and she is agreeable.  Plan for d/c 3/17  ROS: Denies CP, SOB, N/V/D, +bilateral feet swelling, worse on left  Objective:   No results found. Recent Labs    12/24/20 0452  WBC 6.9  HGB 12.9  HCT 39.0  PLT 297   Recent Labs    12/24/20 0452  NA 140  K 4.3  CL 105  CO2 27  GLUCOSE 114*  BUN 13  CREATININE 0.77  CALCIUM 9.5    Intake/Output Summary (Last 24 hours) at 12/25/2020 0931 Last data filed at 12/25/2020 0846 Gross per 24 hour  Intake 560 ml  Output -  Net 560 ml        Physical Exam: Vital Signs Blood pressure (!) 158/67, pulse 86, temperature 97.7 F (36.5 C), temperature source Oral, resp. rate 18, height 5\' 3"  (1.6 m), weight 78.4 kg, SpO2 98 %. Gen: no distress, normal appearing HEENT: oral mucosa pink and moist, NCAT Cardio: Reg rate Chest: normal effort, normal rate of breathing Abd: soft, non-distended Ext: no edema Psych: pleasant, normal affect Skin: intact Musc: No edema in extremities.  No tenderness in extremities. Neuro: Alert RUE/RLE: Grossly 5/5 proximal distal LUE: Grossly 4+/5 proximal distal LLE- HF 4+/5, KE 4/5, DF 2+/5  Assessment/Plan: 1. Functional deficits which require 3+ hours per day of interdisciplinary therapy in a comprehensive inpatient rehab setting.  Physiatrist is providing close team supervision and 24 hour management of active medical problems listed below.  Physiatrist and rehab team continue to assess barriers to discharge/monitor patient progress toward functional and medical goals  Care Tool:  Bathing    Body parts bathed by patient: Right arm,Chest,Abdomen,Face,Left arm,Front perineal area,Right upper leg,Left upper leg   Body parts bathed  by helper: Left arm,Buttocks,Right lower leg,Left lower leg     Bathing assist Assist Level: Moderate Assistance - Patient 50 - 74%     Upper Body Dressing/Undressing Upper body dressing   What is the patient wearing?: Pull over shirt,Bra    Upper body assist Assist Level: Minimal Assistance - Patient > 75%    Lower Body Dressing/Undressing Lower body dressing      What is the patient wearing?: Pants,Underwear/pull up     Lower body assist Assist for lower body dressing: Maximal Assistance - Patient 25 - 49% (with stedy)     Toileting Toileting    Toileting assist Assist for toileting: Minimal Assistance - Patient > 75% (with Stedy)     Transfers Chair/bed transfer  Transfers assist     Chair/bed transfer assist level: Dependent - mechanical lift     Locomotion Ambulation   Ambulation assist   Ambulation activity did not occur: Safety/medical concerns  Assist level: Moderate Assistance - Patient 50 - 74% Assistive device: Walker-rolling Max distance: 31ft   Walk 10 feet activity   Assist  Walk 10 feet activity did not occur: Safety/medical concerns  Assist level: Moderate Assistance - Patient - 50 - 74% Assistive device: Walker-rolling   Walk 50 feet activity  Assist Walk 50 feet with 2 turns activity did not occur: Safety/medical concerns         Walk 150 feet activity   Assist Walk 150 feet activity did not occur: Safety/medical concerns         Walk 10 feet on uneven surface  activity   Assist Walk 10 feet on uneven surfaces activity did not occur: Safety/medical concerns         Wheelchair     Assist Will patient use wheelchair at discharge?: Yes Type of Wheelchair: Manual    Wheelchair assist level: Supervision/Verbal cueing Max wheelchair distance: 50ft    Wheelchair 50 feet with 2 turns activity    Assist        Assist Level: Minimal Assistance - Patient > 75%   Wheelchair 150 feet activity      Assist      Assist Level: Minimal Assistance - Patient > 75%   Blood pressure (!) 158/67, pulse 86, temperature 97.7 F (36.5 C), temperature source Oral, resp. rate 18, height 5\' 3"  (1.6 m), weight 78.4 kg, SpO2 98 %.  Medical Problem List and Plan: 1.  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 Chronic LBP/Pain Management: Continue Tylenol daily, kpad ordered for right neck to relax the muscles.  4. Mood: Team to provide ego support.              -antipsychotic agents: N/A 5. Neuropsych: This patient is not fully capable of making decisions on her own behalf. 6. Skin/Wound Care: Routine pressure relief measures.  7. Fluids/Electrolytes/Nutrition: Monitor I/Os. Electrolytes reviewed and are stable.  8. H/o depression w/GAD: D/c Ativan. Cymbalta 60 mg and Remeron 45 mg/hs. Decrese Seroquel to 12.5mg  daily. Add Seroquel 25mg  HS. Commended on ability to wean off Ativan.   Appears to be controlled on 3/12 9. MS: on no disease modifying agents; Continue Ampyra daily. Has some spasticity and L foot drop- might benefit from spasticity treatment, has L AFO  10. Dyslipidemia: Chol 243/LDL-143             Lipitor daily 11. Constipation  Improved, continue prunes 12, Cognitive decline: SLP ordered- counseling to patient and family regarding optimal nutrition to enhance cognition.  13.  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 Obesity (BMI 30.62): have provided dietary education to patient, her husband, and her daughter-in-law  63. Bilateral feet swelling: continue elevation and compression garments. Add Lasix 20mg  daily until swelling decreases. Cr normal.   3/15: increase Lasix to 40mg . Repeat Cr tomorrow.  Ultrasound of LLE to assess for clot.  16. HTN: up to 562Z systolic today but stable prior, lasix should help.   3/15: continues to be high, increased dose of Lasix should help.  17. Disposition: Discussed goals with husband: he was providing her significant cognitive and physical support at home- helping her get to and from bathroom. Patient states she was wiping herself. Discussed d/c date of 3/17 with goals of improved ambulation in home with as little support from husband as possible- he will hire additional days of health aide (3 to 5, potentially overnight as well)., and improved right arm function. Long term he is looking for facility for  them both to reside. Discussed plan for outpatient follow-up. Discussed plan for d/c with therapy team.    LOS: 12 days A FACE TO FACE EVALUATION WAS [place] 12/25/2020, 9:31 AM

## 2020-12-25 NOTE — Progress Notes (Signed)
Conference time was at [phone].

## 2020-12-25 NOTE — Progress Notes (Signed)
Patient ID: Carly [name], female   DOB: 17-Aug-1951, 70 y.o.   MRN: [id number] Spoke with husband via telephone to discuss team conference progress toward goals and need for family education prior to discharge home Thursday. He and caregiver can be here tomorrow at 10:00-12:00. Discussed wheelchair and he is in agreement with this, will order. See tomorrow to answer questions. Updated pt and she is in agreement with plan.

## 2020-12-25 NOTE — Progress Notes (Signed)
Lower extremity venous LT study completed.   Please see CV Proc for preliminary results.   Shawnette Augello, RDMS, RVT  

## 2020-12-25 NOTE — Plan of Care (Signed)
  Problem: Consults Goal: RH STROKE PATIENT EDUCATION Description: See Patient Education module for education specifics  Outcome: Progressing   Problem: RH BOWEL ELIMINATION Goal: RH STG MANAGE BOWEL WITH ASSISTANCE Description: STG Manage Bowel with mod Assistance. Outcome: Progressing Goal: RH STG MANAGE BOWEL W/MEDICATION W/ASSISTANCE Description: STG Manage Bowel with Medication with mod Assistance. Outcome: Progressing   Problem: RH BLADDER ELIMINATION Goal: RH STG MANAGE BLADDER WITH ASSISTANCE Description: STG Manage Bladder With Mod I Assistance Outcome: Progressing   Problem: RH SKIN INTEGRITY Goal: RH STG SKIN FREE OF INFECTION/BREAKDOWN Description: Skin will be free of infection/breakdown with min assist Outcome: Progressing Goal: RH STG MAINTAIN SKIN INTEGRITY WITH ASSISTANCE Description: STG Maintain Skin Integrity With min Assistance. Outcome: Progressing   Problem: RH SAFETY Goal: RH STG ADHERE TO SAFETY PRECAUTIONS W/ASSISTANCE/DEVICE Description: STG Adhere to Safety Precautions With min Assistance/Device. Outcome: Progressing   Problem: RH PAIN MANAGEMENT Goal: RH STG PAIN MANAGED AT OR BELOW PT'S PAIN GOAL Description: Pain will be managed at or below 3 out of 10 on pain scale with min assist Outcome: Progressing   Problem: RH KNOWLEDGE DEFICIT Goal: RH STG INCREASE KNOWLEGDE OF HYPERLIPIDEMIA Description: Pt will be able to verbalize medication(s) to control hyperlipidemia as well as other ways to reduce from handouts with min assist Outcome: Progressing Goal: RH STG INCREASE KNOWLEDGE OF STROKE PROPHYLAXIS Description: Pt will be able to verbalize medication used as stroke prophylaxis and verbalize precautions from handouts with min assist Outcome: Progressing

## 2020-12-25 NOTE — Progress Notes (Signed)
Physical Therapy Discharge Summary  Patient Details  Name: Carly [name] MRN: [id number] Date of Birth: 14-Nov-1950  Today's Date: 12/26/2020 PT Individual Time: 1130-1157 PT Individual Time Calculation (min): 27 min   Patient has met 5 of 10 long term goals due to improved activity tolerance, improved balance, improved postural control, increased strength, improved attention, improved awareness and improved coordination. Patient to discharge at a wheelchair level Min/ Mod Assist. Patient's care partner requires assistance to provide the necessary physical and cognitive assistance at discharge. Pt's husband and caregiver attended family education on 3/16 and verbalized and demonstrated confidence with transfers. Educated pt's husband/caregiver on need to install temporary ramp due to difficulty navigating stairs. Pt will require ambulance transport home.   Reasons goals not met: Pt did not meet gait goals as pt continues to require mod A to ambulate up to 41f with RW. Pt did not meet WC mobility goal of 540fwith supervision as pt is only able to propel up to 2153fPt did not meet goals due to rigid LLE, R hemi, decreased balance/postural control, poor motor planning/sequencing, generalized weakness, and decreased endurance.   Recommendation:  Patient will benefit from ongoing skilled PT services in home health setting to continue to advance safe functional mobility, address ongoing impairments in transfers, generalized strengthening, dynamic standing balance/coordination, gait training, NMR, motor control/sequencing, endurance, and to minimize fall risk.  Equipment: 18x18 manual WC, RW  Reasons for discharge: treatment goals met and discharge from hospital  Patient/family agrees with progress made and goals achieved: Yes  Today's Interventions: [facility] present for family education training. Session with emphasis on discharge planning,  functional mobility/transfers, generalized strengthening, dynamic standing balance/coordination, simulated car transfers, and improved activity tolerance. Pt [facility] ortho gym [facility] and pt/pt's husband performed simulated car transfer stand<>pivot with min A however pt required max A for LE management as pt with increased RLE extensor tone. Pt's husband reported pt has 4 STE with R handrail. Attempted stair navigation with max A from therapist however pt unable to lift LLE onto step and with increased anxiety and fatigue contributing to poor motor planning and sequencing; therefore returned to sitting for safety. Discussed need for ambulance transport home and need to install temporary ramp. Pt's husband/caregiver in agreement. [facility] and reported urge to urinate. Due to time restrictions pt left in care of NT for toileting.   PT Discharge Precautions/Restrictions Precautions Precautions: Fall Precaution Comments: R hemi, has MS with rigid LLE; pt has L AFO Restrictions Weight Bearing Restrictions: No Cognition Overall Cognitive Status: Impaired/Different from baseline Arousal/Alertness: Awake/alert Orientation Level: Oriented X4 Memory: Impaired Awareness: Impaired Problem Solving: Impaired Behaviors: Lability Safety/Judgment: Impaired Sensation Sensation Light Touch: Appears Intact Proprioception: Impaired by gross assessment Coordination Gross Motor Movements are Fluid and Coordinated: No Fine Motor Movements are Fluid and Coordinated: No Coordination and Movement Description: grossly uncoordinated due to R hemi, hx of MS with rigid LLE, poor motor planning/sequencing, generalized weakness, and poor endurance to activity. Finger Nose Finger Test: slow and mild dysmetria Heel Shin Test: decreased coordination LLE>RLE Motor  Motor Motor: Abnormal postural alignment and control;Hemiplegia Motor - Skilled Clinical Observations: grossly  uncoordinated due to R hemi, hx of MS with rigid LLE, poor motor planning/sequencing, generalized weakness, and poor endurance to activity.  Mobility Bed Mobility Bed Mobility: Rolling Right;Rolling Left;Sit to Supine;Supine to Sit Rolling Right: Supervision/verbal cueing Rolling Left: Supervision/Verbal cueing  Supine to Sit: Minimal Assistance - Patient > 75% Sit to Supine: Supervision/Verbal cueing Transfers Transfers: Sit to Stand;Stand to Sit;Squat Pivot Transfers;Stand Pivot Transfers Sit to Stand: Minimal Assistance - Patient > 75% Stand to Sit: Minimal Assistance - Patient > 75% Stand Pivot Transfers: Moderate Assistance - Patient 50 - 74% Stand Pivot Transfer Details: Verbal cues for sequencing;Verbal cues for technique;Verbal cues for safe use of DME/AE Stand Pivot Transfer Details (indicate cue type and reason): verbal cues for sequencing and technique and manual faciliation to control RW Squat Pivot Transfers: Minimal Assistance - Patient > 75% Transfer (Assistive device): Rolling walker Locomotion  Gait Ambulation: Yes Gait Assistance: Moderate Assistance - Patient 50-74% Gait Distance (Feet): 50 Feet Assistive device: Rolling walker Gait Assistance Details: Verbal cues for sequencing;Verbal cues for technique;Verbal cues for gait pattern;Verbal cues for safe use of DME/AE Gait Assistance Details: verbal cues for stepping sequence, upright posture/gaze, bilateral knee and trunk extension, and manual faciliation to control RW. Gait Gait: Yes Gait Pattern: Impaired Gait Pattern: Trunk flexed;Shuffle;Poor foot clearance - right;Poor foot clearance - left;Step-to pattern;Decreased step length - right;Decreased stride length;Decreased hip/knee flexion - left;Decreased dorsiflexion - left;Narrow base of support;Decreased trunk rotation Gait velocity: decreased Wheelchair Mobility Wheelchair Mobility: Yes Wheelchair Assistance: Chartered loss adjuster:  Both upper extremities Wheelchair Parts Management: Needs assistance Distance: 24f  Trunk/Postural Assessment  Cervical Assessment Cervical Assessment: Exceptions to [facility](cerv[facility] flexion) Thoracic Assessment Thoracic Assessment: Exceptions to [facility](mild kyphosis and scoliosis) Lumbar Assessment Lumbar Assessment: Exceptions to [facility](posterior pelvic tilt) Postural Control Postural Control: Deficits on evaluation  Balance Balance Balance Assessed: Yes Static Sitting Balance Static Sitting - Balance Support: Feet supported;Bilateral upper extremity supported Static Sitting - Level of Assistance: 5: Stand by assistance (supervision) Dynamic Sitting Balance Dynamic Sitting - Balance Support: Feet supported;Bilateral upper extremity supported Dynamic Sitting - Level of Assistance: 5: Stand by assistance (CGA) Static Standing Balance Static Standing - Balance Support: Bilateral upper extremity supported (RW) Static Standing - Level of Assistance: 5: Stand by assistance (CGA) Dynamic Standing Balance Dynamic Standing - Balance Support: Bilateral upper extremity supported (RW) Dynamic Standing - Level of Assistance: 3: Mod assist Extremity Assessment  RLE Assessment RLE Assessment: Exceptions to [facility] St[facility] Comments: grossly generalized to 4+/5 LLE Assessment LLE Assessment: Exceptions to [facility] Strength Comments: grossly generalized to 4+/5 (except DF 3+/5 and hip abductin 3+/5)  AAlfonse AlpersPT, DPT  12/25/2020, 7:52 AM

## 2020-12-25 NOTE — Progress Notes (Signed)
Occupational Therapy Session Note  Patient Details  Name: Carly [name] MRN: [id number] Date of Birth: 01-05-1951  Today's Date: 12/25/2020 OT Individual Time: 1320-1445 OT Individual Time Calculation (min): 85 min    Short Term Goals: Week 2:  OT Short Term Goal 1 (Week 2): Pt will complete self-feeding with supervision/setup with use of built up handle with <2 spills OT Short Term Goal 2 (Week 2): Pt will complete toilet transfer with mod assist stand vs squat pivot OT Short Term Goal 3 (Week 2): Pt will complete LB dressing with max assist of one caregiver  Skilled Therapeutic Interventions/Progress Updates:    Treatment session with focus on self-care retraining, functional transfers, and RUE strengthening.  Pt received upright in recliner agreeable to therapy session.  Pt reports need to toilet.  Completed sit > stand in [place] with CGA and min cues for sequencing and awareness.  Transferred to toilet via [place].  Pt able to complete clothing management with heavy reliance on grab bar and CGA for standing balance.  Pt requested to change her shirt.  Min cues for sequencing and problem solving to remove shirt.  Pt washed chest and donned clean shirt and bra with min assist to pull bra over torso and setup for shirt. Completed stand pivot transfer recliner > w/c with RW with min assist and max multimodal cues for sequencing and problem solving.  Transported pt down to ADL tubroom to complete toilet transfers.  Pt reports having grab bars in bathroom, but would benefit from 3 in 1 over toilet to allow for arm rests to push up from during sit > stand and clothing management.  Blocked practice stand pivot transfers w/c <> 3 in 1 over toilet without AD with pt able to complete with min assist and min cues for sequencing.  Engaged in sit > stand and simulated LB dressing with RW with min assist for standing balance and cues for sequencing.  Pt reports feeling better about transfers after practice.   Pt requesting to practice w/c propulsion.  Due to RUE weakness and decreased motor planning pt continues to require mod-max assist for w/c propulsion.  Therapist provided pt with encouragement and analogy of paddling a canoe to increase awareness of RUE input in w/c propulsion with pt reporting understanding but still unable to coordinate propulsion without extreme veering to R due to overpropulsion with LUE.  Therapist provided hand over hand to increase sequencing with propulsion with no carryover.  Returned to room and completed stand pivot transfer with RW w/c > bed with min assist and mod cues for sequencing of stepping pattern and positioning prior to sitting on EOB.  Therapist doffed shoes and pt able to lift BLE in to bed with supervision.  Pt continues to get easily frustrated by impairments, requiring increased time and encouragement throughout session.  Therapy Documentation Precautions:  Precautions Precautions: Fall Precaution Comments: R hemi, has MS with rigid LLE; pt has L AFO Restrictions Weight Bearing Restrictions: No General:   Vital Signs: Therapy Vitals Temp: 98.5 F (36.9 C) Temp Source: Oral Pulse Rate: 85 Resp: 16 BP: 130/77 Patient Position (if appropriate): Sitting Oxygen Therapy SpO2: 100 % O2 Device: Room Air Pain: Pain Assessment Pain Scale: 0-10 Pain Score: 4  Pain Type: Chronic pain Pain Location: Head Pain Radiating Towards: neck Pain Descriptors / Indicators: Aching Patients Stated Pain Goal: 0 Pain Intervention(s): Medication (See eMAR) (Tylenol)   Therapy/Group: Individual Therapy  Simonne Come 12/25/2020, 3:22 PM

## 2020-12-26 LAB — BASIC METABOLIC PANEL
Anion gap: 9 (ref 5–15)
BUN: 11 mg/dL (ref 8–23)
CO2: 25 mmol/L (ref 22–32)
Calcium: 9.5 mg/dL (ref 8.9–10.3)
Chloride: 105 mmol/L (ref 98–111)
Creatinine, Ser: 0.68 mg/dL (ref 0.44–1.00)
GFR, Estimated: >60 mL/min (ref >60)
Glucose, Bld: 105 mg/dL — ABNORMAL HIGH (ref 70–99)
Potassium: 4.2 mmol/L (ref 3.5–5.1)
Sodium: 139 mmol/L (ref 135–145)

## 2020-12-26 NOTE — Progress Notes (Signed)
Occupational Therapy Session Note  Patient Details  Name: Carly [name] MRN: [id number] Date of Birth: March 23, 1951  Today's Date: 12/26/2020 OT Individual Time: 1004-1100 and 1400-1504 OT Individual Time Calculation (min): 56 min and 64 min   Short Term Goals: Week 2:  OT Short Term Goal 1 (Week 2): Pt will complete self-feeding with supervision/setup with use of built up handle with <2 spills OT Short Term Goal 2 (Week 2): Pt will complete toilet transfer with mod assist stand vs squat pivot OT Short Term Goal 3 (Week 2): Pt will complete LB dressing with max assist of one caregiver  Skilled Therapeutic Interventions/Progress Updates:    1) Treatment session with focus on hands on education with pt's husband and caregiver.  Pt received upright in bed with husband and hired caregiver present for education.  Pt completed bed mobility with min assist and mod multimodal cues for sequencing and problem solving.  Pt frequently attempting to pull on therapist, requiring cues for hand placement to facilitate improved mobility.  Completed stand pivot transfer bed > w/c with min assist and cues for sequencing.  Transitioned to ADL tubroom to practice toilet transfers with caregivers.  Therapist demonstrated stand pivot transfer w/c > BSC over toilet with min assist.  Educated on hand placement to facilitate weight shift.  Pt's caregiver and husband completed transfers w/c <> BSC with min cues from therapist for technique.  Engaged in sit> stand from [facility] with RW for UE support with min assist and CGA when completing simulated clothing management.  Blocked practice sit > stand with pt's husband and caregiver with min cues initially for hand placement fading to allowing caregiver and husband tor provide cues to pt.  Pt demonstrates increased frustration and anxiety with mobility, requiring cues and encouragement.  Pt returned to room to continue education with other disciplines, passed off to SLP.  2)  Treatment session with focus on RUE gross and fine motor control.  Pt received upright in recliner with husband and hired caregiver still present.  Pt continues to report frustration with handwriting and occasional spilling during self-feeding.  Pt reports much improved with self-feeding with use of built up handle and decreased distractions.  Pt voices frustration with handwriting.  Discussed "building blocks" with focus in fine and gross motor control and strengthening to build up as pre-cursor to writing.  Reinforced use of theraputty and small blocks.  Engaged in ball toss with focus on increased symmetry as pt continuing to demonstrate overpowering of LUE.  Encouraged pt to utilize adult coloring books and other table top games or activities to continue to utilize dominant RUE into functional tasks to continue to strengthen as needed for handwriting.  Provided pt with handouts of pre-writing activities and encouraged pt to pace herself and not be overcritical of impairments.  Pt's husband and caregiver report understanding of use of functional tasks as well as strengthening and coordination tasks to improve RUE function overall.    Therapy Documentation Precautions:  Precautions Precautions: Fall Precaution Comments: R hemi, has MS with rigid LLE; pt has L AFO Restrictions Weight Bearing Restrictions: No General:   Vital Signs: Therapy Vitals Temp: 98.5 F (36.9 C) Temp Source: Oral Pulse Rate: 90 Resp: 18 BP: 140/75 Patient Position (if appropriate): Sitting Oxygen Therapy SpO2: 98 % Pain: Pain Assessment Pain Scale: 0-10 Pain Score: 1  Pain Type: Chronic pain Pain Location: Neck Pain Descriptors / Indicators: Aching Pain Frequency: Constant Pain Onset: On-going Patients Stated Pain Goal: 2 Pain Intervention(s):  Medication (See eMAR)   Therapy/Group: Individual Therapy  Simonne Come 12/26/2020, 4:24 PM

## 2020-12-26 NOTE — Progress Notes (Signed)
Inpatient Rehabilitation Care Coordinator Discharge Note  The overall goal for the admission was met for:   Discharge location: Yes-HOME WITH HUSBAND AND CAREGIVER AWARE 24/7 CARE NEEDED  Length of Stay: Yes-14 DAYS  Discharge activity level: Yes-MIN-MOD LEVEL  Home/community participation: Yes  Services provided included: MD, RD, PT, OT, SLP, RN, CM, TR, Pharmacy, Neuropsych and SW  Financial Services: Medicare and Private Insurance: [place] offered to/list presented to:YES  Follow-up services arranged: Home Health: [place] [facility] CARE-PT,OT,SP, DME: [facility]-WHEELCHAIR & 3 IN 1 and Patient/Family request agency HH: [facility], DME: NO PREF  Comments (or additional information):HSBAND AND PRIVATE DUTY CAREGIVER IN FOR EDUCATION AND AWARE OF PT'S CARE NEEDS. WILL NEED PTAR HOME DUE TO UNABLE TO GO UP AND DOWN STAIRS AND HUSBAND WILL NEED TO GET HER A RAMP  Patient/Family verbalized understanding of follow-up arrangements: Yes  Individual responsible for coordination of the follow-up plan: BILL-HUSBAND [phone]  Confirmed correct DME delivered: Elease Hashimoto 12/26/2020    Katerra Ingman, [place]

## 2020-12-26 NOTE — Progress Notes (Addendum)
Patient ID: Carly [name], female   DOB: 02-28-1951, 70 y.o.   MRN: [id number] met with pt, husband and caregiver who are here for education in preparation for discharge tomorrow. Pt's wheelchair and 3 in 1 were delivered to her room. They prefer [facility] since has had them before and like [facility]-PT. Have messaged [facility]-liaison [facility] this and await response whether can pick up case.  1:50 PM [facility] can accept the case and follow pt at home. PT reports too difficult to get up and down steps, will need ambulance transport home until can get ramp in place. Family education went well and ready for discharge tomorrow.

## 2020-12-26 NOTE — Progress Notes (Signed)
Occupational Therapy Discharge Summary  Patient Details  Name: Carly [name] MRN: [id number] Date of Birth: [birth year]   Patient has met 11 of 11 long term goals due to improved activity tolerance, improved balance, ability to compensate for deficits, functional use of  RIGHT upper extremity and improved attention.  Patient to discharge at [facility] Assist level for transfers, Mod assist bathing/dressing, and supervision for use of RUE with grooming and self-feeding.  Patient's care partner is independent to provide the necessary physical and cognitive assistance at discharge.  Patient's husband and caregiver completed hands on education with focus on sit> stand, dynamic standing, transfers, and RUE NMR with ability to demonstrate understanding of proper techniques and how to cue pt to increase carryover.  Reasons goals not met: N/A  Recommendation:  Patient will benefit from ongoing skilled OT services in home health setting to continue to advance functional skills in the area of BADL and Reduce care partner burden.  Equipment: 3 in 1  Reasons for discharge: treatment goals met and discharge from hospital  Patient/family agrees with progress made and goals achieved: Yes  OT Discharge Precautions/Restrictions  Precautions Precautions: Fall Precaution Comments: R hemi, has MS with rigid LLE; pt has L AFO General   Vital Signs Therapy Vitals Temp: 98.5 F (36.9 C) Temp Source: Oral Pulse Rate: 90 Resp: 18 BP: 140/75 Patient Position (if appropriate): Sitting Oxygen Therapy SpO2: 98 % Pain Pain Assessment Pain Scale: 0-10 Pain Score: 1  Faces Pain Scale: No hurt Pain Type: Chronic pain Pain Location: Neck Pain Descriptors / Indicators: Aching Pain Frequency: Constant Pain Onset: On-going Patients Stated Pain Goal: 2 Pain Intervention(s): Medication (See eMAR) ADL ADL Eating: Supervision/safety Where Assessed-Eating: Chair Grooming: Supervision/safety Where  Assessed-Grooming: Sitting at sink Upper Body Bathing: Supervision/safety Where Assessed-Upper Body Bathing: Chair Lower Body Bathing: Moderate assistance Where Assessed-Lower Body Bathing: Chair Upper Body Dressing: Minimal assistance Where Assessed-Upper Body Dressing: Chair Lower Body Dressing: Moderate assistance Where Assessed-Lower Body Dressing: Chair Toileting: Minimal assistance Where Assessed-Toileting: Bedside Commode Toilet Transfer: Minimal assistance Toilet Transfer Method: Stand pivot Toilet Transfer Equipment: Bedside commode Vision Baseline Vision/History: Wears glasses;Cataracts Wears Glasses: At all times Patient Visual Report: No change from baseline Vision Assessment?: Yes Eye Alignment: Within Functional Limits Ocular Range of Motion: Within Functional Limits Tracking/Visual Pursuits: Able to track stimulus in all quads without difficulty Perception  Perception: Within Functional Limits Praxis Praxis: Impaired Praxis Impairment Details: Ideation;Motor planning Cognition Overall Cognitive Status: Impaired/Different from baseline Arousal/Alertness: Awake/alert Orientation Level: Oriented X4 Attention: Sustained Focused Attention: Appears intact Sustained Attention: Impaired Sustained Attention Impairment: Verbal basic;Functional basic Memory: Impaired Memory Impairment: Decreased recall of new information;Retrieval deficit Decreased Short Term Memory: Verbal basic;Functional basic;Verbal complex Awareness: Impaired Awareness Impairment: Anticipatory impairment Problem Solving: Impaired Problem Solving Impairment: Verbal complex;Functional basic;Functional complex Executive Function: Writer: Impaired Organizing Impairment: Verbal basic;Verbal complex;Functional basic;Functional complex Behaviors: Lability Safety/Judgment: Impaired Sensation Sensation Light Touch: Appears Intact Proprioception: Impaired by gross  assessment Coordination Gross Motor Movements are Fluid and Coordinated: No Fine Motor Movements are Fluid and Coordinated: No Coordination and Movement Description: grossly uncoordinated due to R hemi, hx of MS with rigid LLE, poor motor planning/sequencing, generalized weakness, and poor endurance to activity. Finger Nose Finger Test: slow and mild dysmetria Heel Shin Test: decreased coordination LLE>RLE Motor  Motor Motor: Abnormal postural alignment and control;Hemiplegia Motor - Skilled Clinical Observations: grossly uncoordinated due to R hemi, hx of MS with rigid LLE, poor motor planning/sequencing, generalized weakness, and poor endurance to  activity. Mobility  Bed Mobility Bed Mobility: Rolling Right;Rolling Left;Sit to Supine;Supine to Sit Rolling Right: Supervision/verbal cueing Rolling Left: Supervision/Verbal cueing Supine to Sit: Minimal Assistance - Patient > 75% Sit to Supine: Supervision/Verbal cueing Transfers Sit to Stand: Minimal Assistance - Patient > 75% Stand to Sit: Minimal Assistance - Patient > 75%  Trunk/Postural Assessment  Cervical Assessment Cervical Assessment: Exceptions to [facility] (cervical flexion) Thoracic Assessment Thoracic Assessment: Exceptions to [facility] (mild kyphosis and scoliosis) Lumbar Assessment Lumbar Assessment: Exceptions to [facility] (posterior pelvic tilt) Postural Control Postural Control: Deficits on evaluation  Balance Balance Balance Assessed: Yes Static Sitting Balance Static Sitting - Balance Support: Feet supported;Bilateral upper extremity supported Static Sitting - Level of Assistance: 5: Stand by assistance (supervision) Dynamic Sitting Balance Dynamic Sitting - Balance Support: Feet supported;Bilateral upper extremity supported Dynamic Sitting - Level of Assistance: 5: Stand by assistance (CGA) Static Standing Balance Static Standing - Balance Support: Bilateral upper extremity supported (RW) Static Standing - Level of  Assistance: 5: Stand by assistance (CGA) Dynamic Standing Balance Dynamic Standing - Balance Support: Bilateral upper extremity supported (RW) Dynamic Standing - Level of Assistance: 3: Mod assist Extremity/Trunk Assessment RUE Assessment RUE Assessment: Exceptions to [facility] Passive Range of Motion (PROM) Comments: WNL Active Range of Motion (AROM) Comments: grossly 110* shoulder flexion, elbow WNL General Strength Comments: Slight inattention to RUE during functional tasks. Strength: 3/5 grip, 4-/5 elbow flexion/extension, and at least 3/5 shoulder flexion LUE Assessment LUE Assessment: Within Functional Limits Active Range of Motion (AROM) Comments: WNL General Strength Comments: grossly 4/5   Lorrie Strauch, [facility] 12/26/2020, 4:39 PM

## 2020-12-26 NOTE — Progress Notes (Signed)
PROGRESS NOTE   Subjective/Complaints: Left lower extremity >right lower extremity swelling persists No other complaints Being transferred to bathroom in [place]  ROS: Denies CP, SOB, N/V/D, +bilateral feet swelling, worse on left  Objective:   VAS [place] LOWER EXTREMITY VENOUS (DVT)  Result Date: 12/25/2020  Lower Venous DVT Study Indications: Asymmetrical swelling to LT foot/ankle.  Comparison Study: No prior studies. Performing Technologist: Darlin Coco RDMS,RVT  Examination Guidelines: A complete evaluation includes B-mode imaging, spectral Doppler, color Doppler, and power Doppler as needed of all accessible portions of each vessel. Bilateral testing is considered an integral part of a complete examination. Limited examinations for reoccurring indications may be performed as noted. The reflux portion of the exam is performed with the patient in reverse Trendelenburg.  +-----+---------------+---------+-----------+----------+--------------+ RIGHTCompressibilityPhasicitySpontaneityPropertiesThrombus Aging +-----+---------------+---------+-----------+----------+--------------+ CFV  Full           Yes      Yes                                 +-----+---------------+---------+-----------+----------+--------------+   +---------+---------------+---------+-----------+----------+--------------+ LEFT     CompressibilityPhasicitySpontaneityPropertiesThrombus Aging +---------+---------------+---------+-----------+----------+--------------+ CFV      Full           Yes      Yes                                 +---------+---------------+---------+-----------+----------+--------------+ SFJ      Full                                                        +---------+---------------+---------+-----------+----------+--------------+ FV Prox  Full                                                         +---------+---------------+---------+-----------+----------+--------------+ FV Mid   Full                                                        +---------+---------------+---------+-----------+----------+--------------+ FV DistalFull                                                        +---------+---------------+---------+-----------+----------+--------------+ PFV      Full                                                        +---------+---------------+---------+-----------+----------+--------------+  POP      Full           Yes      Yes                                 +---------+---------------+---------+-----------+----------+--------------+ PTV      Full                                                        +---------+---------------+---------+-----------+----------+--------------+ PERO     Full                                                        +---------+---------------+---------+-----------+----------+--------------+     Summary: RIGHT: - No evidence of common femoral vein obstruction.  LEFT: - There is no evidence of deep vein thrombosis in the lower extremity.  - No cystic structure found in the popliteal fossa.  *See table(s) above for measurements and observations. Electronically signed by Deitra Mayo MD on 12/25/2020 at 3:47:19 PM.    Final    Recent Labs    12/24/20 0452  WBC 6.9  HGB 12.9  HCT 39.0  PLT 297   Recent Labs    12/24/20 0452 12/26/20 0125  NA 140 139  K 4.3 4.2  CL 105 105  CO2 27 25  GLUCOSE 114* 105*  BUN 13 11  CREATININE 0.77 0.68  CALCIUM 9.5 9.5    Intake/Output Summary (Last 24 hours) at 12/26/2020 0909 Last data filed at 12/26/2020 0730 Gross per 24 hour  Intake 520 ml  Output --  Net 520 ml        Physical Exam: Vital Signs Blood pressure 102/87, pulse 91, temperature 97.8 F (36.6 C), temperature source Oral, resp. rate 18, height 5\' 3"  (1.6 m), weight 78.4 kg, SpO2 95 %. Gen: no  distress, normal appearing HEENT: oral mucosa pink and moist, NCAT Cardio: Reg rate Chest: normal effort, normal rate of breathing Abd: soft, non-distended Ext: no edema Psych: pleasant, normal affect Skin: intact Musc: No edema in extremities.  No tenderness in extremities. Neuro: Alert RUE/RLE: Grossly 5/5 proximal distal LUE: Grossly 4+/5 proximal distal LLE- HF 4+/5, KE 4/5, DF 2+/5  Assessment/Plan: 1. Functional deficits which require 3+ hours per day of interdisciplinary therapy in a comprehensive inpatient rehab setting.  Physiatrist is providing close team supervision and 24 hour management of active medical problems listed below.  Physiatrist and rehab team continue to assess barriers to discharge/monitor patient progress toward functional and medical goals  Care Tool:  Bathing    Body parts bathed by patient: Right arm,Left arm,Chest,Abdomen   Body parts bathed by helper: Left arm,Buttocks,Right lower leg,Left lower leg     Bathing assist Assist Level: Set up assist     Upper Body Dressing/Undressing Upper body dressing   What is the patient wearing?: Pull over shirt,Bra    Upper body assist Assist Level: Minimal Assistance - Patient > 75%    Lower Body Dressing/Undressing Lower body dressing      What is the patient wearing?: Pants,Underwear/pull up     Lower body assist  Assist for lower body dressing: Maximal Assistance - Patient 25 - 49% (with [place])     Toileting Toileting    Toileting assist Assist for toileting: Minimal Assistance - Patient > 75%     Transfers Chair/bed transfer  Transfers assist     Chair/bed transfer assist level: Minimal Assistance - Patient > 75%     Locomotion Ambulation   Ambulation assist   Ambulation activity did not occur: Safety/medical concerns  Assist level: Moderate Assistance - Patient 50 - 74% Assistive device: Walker-rolling Max distance: 60ft   Walk 10 feet activity   Assist  Walk 10  feet activity did not occur: Safety/medical concerns  Assist level: Moderate Assistance - Patient - 50 - 74% Assistive device: Walker-rolling   Walk 50 feet activity   Assist Walk 50 feet with 2 turns activity did not occur: Safety/medical concerns         Walk 150 feet activity   Assist Walk 150 feet activity did not occur: Safety/medical concerns         Walk 10 feet on uneven surface  activity   Assist Walk 10 feet on uneven surfaces activity did not occur: Safety/medical concerns         Wheelchair     Assist Will patient use wheelchair at discharge?: Yes Type of Wheelchair: Manual    Wheelchair assist level: Supervision/Verbal cueing Max wheelchair distance: 42ft    Wheelchair 50 feet with 2 turns activity    Assist        Assist Level: Minimal Assistance - Patient > 75%   Wheelchair 150 feet activity     Assist      Assist Level: Minimal Assistance - Patient > 75%   Blood pressure 102/87, pulse 91, temperature 97.8 F (36.6 C), temperature source Oral, resp. rate 18, height 5\' 3"  (1.6 m), weight 78.4 kg, SpO2 95 %.  Medical Problem List and Plan: 1.  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 Chronic LBP/Pain Management: Continue Tylenol daily, kpad ordered for right neck to relax the muscles.  4. Mood: Team to provide ego support.              -antipsychotic agents: N/A 5. Neuropsych: This patient is not fully capable of making decisions on her own behalf. 6. Skin/Wound Care: Routine pressure relief measures.  7. Fluids/Electrolytes/Nutrition: Monitor I/Os. Electrolytes reviewed and are stable.  8. H/o depression w/GAD: D/c Ativan. Cymbalta 60 mg and Remeron 45 mg/hs. Decrese Seroquel to 12.5mg  daily. Add Seroquel 25mg  HS. Commended on  ability to wean off Ativan.   Appears to be controlled on 3/16 9. MS: on no disease modifying agents; Continue Ampyra daily. Has some spasticity and L foot drop- might benefit from spasticity treatment, has L AFO  10. Dyslipidemia: Chol 243/LDL-143             Continue Lipitor daily 11. Constipation  Improved, continue prunes 12, Cognitive decline: SLP ordered- counseling to patient and family regarding optimal nutrition to enhance cognition.  13.  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 Obesity (BMI 30.62): have provided dietary education to patient, her husband, and her daughter-in-law  31. Bilateral feet swelling: continue elevation and compression garments. Add Lasix 20mg  daily  until swelling decreases. Cr normal.   3/15: increase Lasix to 40mg . Repeat Cr tomorrow. Ultrasound of LLE to assess for clot.   3/16: [place] is negative for clot. Cr is normal. Continue Lasix as swelling persists.  16. HTN: up to 039J systolic today but stable prior, lasix should help.   3/15: continues to be high, increased dose of Lasix should help.   3/16: much better controlled with lasix, continue as above.  17. Disposition: Discussed goals with husband: he was providing her significant cognitive and physical support at home- helping her get to and from bathroom. Patient states she was wiping herself. Discussed d/c date of 3/17 with goals of improved ambulation in home with as little support from husband as possible- he will hire additional days of health aide (3 to 5, potentially overnight as well)., and improved right arm function. Long term he is looking for facility for them both to reside. Discussed plan for outpatient follow-up. Discussed plan for d/c with therapy team.    LOS: 13 days A FACE TO FACE EVALUATION WAS PERFORMED  Link Burgeson P Rocquel Askren 12/26/2020, 9:09 AM

## 2020-12-27 MED ORDER — ATORVASTATIN CALCIUM 80 MG PO TABS: 80.0000 mg | ORAL_TABLET | Freq: Every day | ORAL | 0 refills | Status: DC

## 2020-12-27 MED ORDER — CLOPIDOGREL BISULFATE 75 MG PO TABS: 75.0000 mg | ORAL_TABLET | Freq: Every day | ORAL | 0 refills | Status: DC

## 2020-12-27 MED ORDER — FUROSEMIDE 40 MG PO TABS: 40.0000 mg | ORAL_TABLET | Freq: Every day | ORAL | 0 refills | Status: DC

## 2020-12-27 MED ORDER — QUETIAPINE FUMARATE 25 MG PO TABS: ORAL_TABLET | ORAL | 0 refills | Status: DC

## 2020-12-27 MED ORDER — DULOXETINE HCL 60 MG PO CPEP: 60.0000 mg | ORAL_CAPSULE | Freq: Every day | ORAL | 0 refills | Status: DC

## 2020-12-27 MED ORDER — MIRTAZAPINE 45 MG PO TABS: 45.0000 mg | ORAL_TABLET | Freq: Every day | ORAL | 0 refills | Status: DC

## 2020-12-27 MED ORDER — SACCHAROMYCES BOULARDII 250 MG PO CAPS: 250.0000 mg | ORAL_CAPSULE | Freq: Two times a day (BID) | ORAL | 0 refills | Status: DC

## 2020-12-27 NOTE — Progress Notes (Signed)
PROGRESS NOTE   Subjective/Complaints: Continues to have swelling.  Discussed with her that creatinine is stable.  Discussed that vascular ultrasound is stable. Recommended that she continue Lasix as needed at home.   ROS: Denies CP, SOB, N/V/D, +bilateral feet swelling, worse on left  Objective:   VAS [place] LOWER EXTREMITY VENOUS (DVT)  Result Date: 12/25/2020  Lower Venous DVT Study Indications: Asymmetrical swelling to LT foot/ankle.  Comparison Study: No prior studies. Performing Technologist: Darlin Coco RDMS,RVT  Examination Guidelines: A complete evaluation includes B-mode imaging, spectral Doppler, color Doppler, and power Doppler as needed of all accessible portions of each vessel. Bilateral testing is considered an integral part of a complete examination. Limited examinations for reoccurring indications may be performed as noted. The reflux portion of the exam is performed with the patient in reverse Trendelenburg.  +-----+---------------+---------+-----------+----------+--------------+ RIGHTCompressibilityPhasicitySpontaneityPropertiesThrombus Aging +-----+---------------+---------+-----------+----------+--------------+ CFV  Full           Yes      Yes                                 +-----+---------------+---------+-----------+----------+--------------+   +---------+---------------+---------+-----------+----------+--------------+ LEFT     CompressibilityPhasicitySpontaneityPropertiesThrombus Aging +---------+---------------+---------+-----------+----------+--------------+ CFV      Full           Yes      Yes                                 +---------+---------------+---------+-----------+----------+--------------+ SFJ      Full                                                        +---------+---------------+---------+-----------+----------+--------------+ FV Prox  Full                                                         +---------+---------------+---------+-----------+----------+--------------+ FV Mid   Full                                                        +---------+---------------+---------+-----------+----------+--------------+ FV DistalFull                                                        +---------+---------------+---------+-----------+----------+--------------+ PFV      Full                                                        +---------+---------------+---------+-----------+----------+--------------+  POP      Full           Yes      Yes                                 +---------+---------------+---------+-----------+----------+--------------+ PTV      Full                                                        +---------+---------------+---------+-----------+----------+--------------+ PERO     Full                                                        +---------+---------------+---------+-----------+----------+--------------+     Summary: RIGHT: - No evidence of common femoral vein obstruction.  LEFT: - There is no evidence of deep vein thrombosis in the lower extremity.  - No cystic structure found in the popliteal fossa.  *See table(s) above for measurements and observations. Electronically signed by Deitra Mayo MD on 12/25/2020 at 3:47:19 PM.    Final    No results for input(s): WBC, HGB, HCT, PLT in the last 72 hours. Recent Labs    12/26/20 0125  NA 139  K 4.2  CL 105  CO2 25  GLUCOSE 105*  BUN 11  CREATININE 0.68  CALCIUM 9.5    Intake/Output Summary (Last 24 hours) at 12/27/2020 0916 Last data filed at 12/26/2020 2109 Gross per 24 hour  Intake 780 ml  Output --  Net 780 ml        Physical Exam: Vital Signs Blood pressure 121/72, pulse 93, temperature 99.1 F (37.3 C), temperature source Oral, resp. rate 18, height 5\' 3"  (1.6 m), weight 78.4 kg, SpO2 97 %. Gen: no distress, normal appearing HEENT:  oral mucosa pink and moist, NCAT Cardio: Reg rate Chest: normal effort, normal rate of breathing Abd: soft, non-distended Ext: no edema Psych: pleasant, normal affect Skin: intact Musc: No edema in extremities.  No tenderness in extremities. Neuro: Alert RUE/RLE: Grossly 5/5 proximal distal LUE: Grossly 4+/5 proximal distal LLE- HF 4+/5, KE 4/5, DF 2+/5  Assessment/Plan: 1. Functional deficits which require 3+ hours per day of interdisciplinary therapy in a comprehensive inpatient rehab setting.  Physiatrist is providing close team supervision and 24 hour management of active medical problems listed below.  Physiatrist and rehab team continue to assess barriers to discharge/monitor patient progress toward functional and medical goals  Care Tool:  Bathing    Body parts bathed by patient: Right arm,Left arm,Chest,Abdomen,Buttocks,Right upper leg,Front perineal area,Left upper leg,Face   Body parts bathed by helper: Right lower leg,Left lower leg     Bathing assist Assist Level: Minimal Assistance - Patient > 75%     Upper Body Dressing/Undressing Upper body dressing   What is the patient wearing?: Pull over shirt,Bra    Upper body assist Assist Level: Minimal Assistance - Patient > 75%    Lower Body Dressing/Undressing Lower body dressing      What is the patient wearing?: Pants,Underwear/pull up     Lower body assist Assist for lower body dressing: Moderate Assistance - Patient 50 -  74%     Toileting Toileting    Toileting assist Assist for toileting: Minimal Assistance - Patient > 75%     Transfers Chair/bed transfer  Transfers assist     Chair/bed transfer assist level: Minimal Assistance - Patient > 75%     Locomotion Ambulation   Ambulation assist   Ambulation activity did not occur: Safety/medical concerns  Assist level: Moderate Assistance - Patient 50 - 74% Assistive device: Walker-rolling Max distance: 57ft   Walk 10 feet  activity   Assist  Walk 10 feet activity did not occur: Safety/medical concerns  Assist level: Moderate Assistance - Patient - 50 - 74% Assistive device: Walker-rolling   Walk 50 feet activity   Assist Walk 50 feet with 2 turns activity did not occur: Safety/medical concerns  Assist level: Moderate Assistance - Patient - 50 - 74% Assistive device: Walker-rolling    Walk 150 feet activity   Assist Walk 150 feet activity did not occur: Safety/medical concerns (fatigue, poor motor planning, weakness)         Walk 10 feet on uneven surface  activity   Assist Walk 10 feet on uneven surfaces activity did not occur: Safety/medical concerns (fatigue, poor motor planning, weakness)         Wheelchair     Assist Will patient use wheelchair at discharge?: Yes Type of Wheelchair: Manual    Wheelchair assist level: Supervision/Verbal cueing Max wheelchair distance: 54ft    Wheelchair 50 feet with 2 turns activity    Assist        Assist Level: Moderate Assistance - Patient 50 - 74%   Wheelchair 150 feet activity     Assist      Assist Level: Total Assistance - Patient < 25%   Blood pressure 121/72, pulse 93, temperature 99.1 F (37.3 C), temperature source Oral, resp. rate 18, height 5\' 3"  (1.6 m), weight 78.4 kg, SpO2 97 %.  Medical Problem List and Plan: 1.  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 Chronic LBP/Pain Management: Continue Tylenol daily, kpad ordered for right neck to relax the muscles.  4. Mood: Team to provide ego support.              -antipsychotic agents: N/A 5. Neuropsych: This patient is not fully capable of making decisions on her own behalf. 6. Skin/Wound Care: Routine pressure relief measures.  7.  Fluids/Electrolytes/Nutrition: Monitor I/Os. Electrolytes reviewed and are stable.  8. H/o depression w/GAD: D/c Ativan. Cymbalta 60 mg and Remeron 45 mg/hs. Decrese Seroquel to 12.5mg  daily. Add Seroquel 25mg  HS. Commended on ability to wean off Ativan.   Appears to be controlled on 3/16 9. MS: on no disease modifying agents; Continue Ampyra daily. Has some spasticity and L foot drop- might benefit from spasticity treatment, has L AFO  10. Dyslipidemia: Chol 243/LDL-143             Continue Lipitor daily 11. Constipation  Improved, continue prunes 12, Cognitive decline: SLP ordered- counseling to patient and family regarding optimal nutrition to enhance cognition.  13.  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 Obesity (BMI 30.62): have provided dietary education to patient, her husband, and her daughter-in-law  27. Bilateral feet swelling: continue  elevation and compression garments. Add Lasix 20mg  daily until swelling decreases. Cr normal.   3/15: increase Lasix to 40mg . Repeat Cr tomorrow. Ultrasound of LLE to assess for clot.   3/16: [place] is negative for clot. Cr is normal. Continue Lasix as swelling persists.   3/17: Discussed with patient that [place] and creatinines are normal. Continue lasix as needed at home.  16. HTN: up to 100F systolic today but stable prior, lasix should help.   3/15: continues to be high, increased dose of Lasix should help.   3/16: much better controlled with lasix, continue as above.   3/17: continue lasix as needed at home 17. Disposition: Discussed goals with husband: he was providing her significant cognitive and physical support at home- helping her get to and from bathroom. Patient states she was wiping herself. Discussed d/c date of 3/17 with goals of improved ambulation in home with as little support from husband as possible- he will hire additional days of health aide (3 to 5, potentially overnight as well)., and improved right  arm function. Long term he is looking for facility for them both to reside. Discussed plan for outpatient follow-up. Discussed plan for d/c with therapy team.    >30 minutes spent in discharge of patient including review of medications and follow-up appointments, physical examination, and in answering all patient's questions   LOS: 14 days A FACE TO [place] 12/27/2020, 9:16 AM

## 2020-12-27 NOTE — Discharge Instructions (Signed)
Inpatient Rehab Discharge Instructions  LAQUASHIA MERGENTHALER Discharge date and time: 12/27/20    Activities/Precautions/ Functional Status: Activity: no lifting, driving, or strenuous exercise till cleared by MD Diet: cardiac diet Wound Care: none needed    Functional status:  ___ No restrictions     ___ Walk up steps independently _X__ 24/7 supervision/assistance   ___ Walk up steps with assistance ___ Intermittent supervision/assistance  ___ Bathe/dress independently ___ Walk with walker     _X__ Bathe/dress with assistance ___ Walk Independently    ___ Shower independently ___ Walk with assistance    ___ Shower with assistance _X__ No alcohol     ___ Return to work/school ________   Special Instructions: 1. Call in next 1-2 days to set appointment with neurology in 3-4 weeks for stroke follow up.     COMMUNITY REFERRALS UPON DISCHARGE:    Home Health:   PT, OT, [place] Equipment/Items Ordered:WHEELCHAIR & 3 IN 1                                                 Agency/Supplier:[facility]  [phone]      My questions have been answered and I understand these instructions. I will adhere to these goals and the provided educational materials after my discharge from the hospital.  Patient/Caregiver Signature _______________________________ Date __________  Clinician Signature _______________________________________ Date __________  Please bring this form and your medication list with you to all your follow-up doctor's appointments.

## 2020-12-27 NOTE — Discharge Summary (Signed)
Physician Discharge Summary  Patient ID: Carly [name] MRN: [id number] DOB/AGE: 02-16-1951 70 y.o.  Admit date: 12/13/2020 Discharge date: 12/27/2020  Discharge Diagnoses:  Principal Problem:   White matter periventricular infarction [facility]) Active Problems:   PBA (pseudobulbar affect)   MS (multiple sclerosis) (HCC)   Anxiety and depression   Discharged Condition: Stable   Significant Diagnostic Studies: N/A   Labs:  Basic Metabolic Panel: BMP Latest Ref Rng & Units 12/26/2020 12/24/2020 12/17/2020  Glucose 70 - 99 mg/dL 105(H) 114(H) 113(H)  BUN 8 - 23 mg/dL 11 13 9   Creatinine 0.44 - 1.00 mg/dL 0.68 0.77 0.70  BUN/Creat Ratio 6 - 22 (calc) - - -  Sodium 135 - 145 mmol/L 139 140 138  Potassium 3.5 - 5.1 mmol/L 4.2 4.3 3.8  Chloride 98 - 111 mmol/L 105 105 105  CO2 22 - 32 mmol/L 25 27 21(L)  Calcium 8.9 - 10.3 mg/dL 9.5 9.5 9.3    CBC: CBC Latest Ref Rng & Units 12/24/2020 12/17/2020 12/14/2020  WBC 4.0 - 10.5 K/uL 6.9 6.8 9.0  Hemoglobin 12.0 - 15.0 g/dL 12.9 13.4 14.0  Hematocrit 36.0 - 46.0 % 39.0 39.9 43.6  Platelets 150 - 400 K/uL 297 283 330    CBG: No results for input(s): GLUCAP in the last 168 hours.  Brief HPI:   Carly [name] is a 70 y.o. female with history of MS with LLE weakness, GERD, anxiety depression who was admitted on 12/08/2020 to [facility] with weakness, dizziness and mental status changes.  MRI brain showed acute white matter infarct in deep white matter left coronary radiata.  Neurology felt that stroke was due to small vessel disease and Plavix was added for secondary stroke prevention.  Psychiatry was consulted for assistance managing acute on chronic GAD with bouts of lability and recommended increasing Remeron to 45 mg at bedtime.  Patient with resultant right-sided weakness in addition to chronic left-sided weakness which was affecting her balance, processing as well as decreased safety awareness.  Therapy was ongoing and CIR was recommended due  to functional decline.   Hospital Course: Carly [name] was admitted to rehab 12/13/2020 for inpatient therapies to consist of PT, ST and OT at least three hours five days a week. Past admission physiatrist, therapy team and rehab RN have worked together to provide customized collaborative inpatient rehab. Blood pressures were monitored on TID basis and were noted to be borderline. She was noted to have bouts of lability with PVA but did not wish to start any meds.  She has had issues with BLE edema and compression stockings as well as elevations were ineffective therefore low-dose Lasix was added to help with management.   BLE Dopplers were negative for DVT.  Follow-up check of electrolytes showed potassium and renal status to be within normal limits on current dose diuretic.  Team has provided ego support during his stay.  She was tolerating increase in Remeron as well as addition of Seroquel 25 mg at bedtime to help manage anxiety.  Left AFO was ordered due to foot drop and to help with gait.  She has progressed to min assist with mobility and will continue to receive follow-up home health PT, OT and speech therapy by [facility] after discharge.    Rehab course: During patient's stay in rehab weekly team conferences were held to monitor patient's progress, set goals and discuss barriers to discharge. At admission, patient required max assist with mobility and total assist with basic self-care  tasks. She exhibited moderate cognitive impairments.  She  has had improvement in activity tolerance, balance, postural control as well as ability to compensate for deficits.  She requires min assist for transfers, mod assist for bathing and dressing tasks with supervision to utilize RUE for grooming and self-feeding.  She requires min assist supervision for basic to mildly complex problem-solving tasks, awareness and recall.  Family education was completed regarding all aspects of safety and care.     Discharge disposition: 01-Home or Self Care  Diet: Regular   Special Instructions: 1. No driving or strenuous activity.    Discharge Instructions    Ambulatory referral to Psychology   Complete by: As directed    Hospital follow up     Allergies as of 12/27/2020      Reactions   Sulfamethoxazole-trimethoprim Itching   Asa [aspirin] Swelling   Buspirone Other (See Comments)   Headaches   Levofloxacin Other (See Comments)   "weakness"   Lexapro [escitalopram Oxalate] Other (See Comments)   Weakness   Motrin [ibuprofen] Swelling, Other (See Comments)   Pollen Extract Hives   Ceftin [cefuroxime Axetil] Rash   Penicillins Rash   Sulfa Antibiotics Rash      Medication List    STOP taking these medications   azelastine 0.1 % nasal spray Commonly known as: ASTELIN   DEPLIN 15 PO   LORazepam 0.5 MG tablet Commonly known as: ATIVAN   PROBIOTIC ACIDOPHILUS PO     TAKE these medications   acetaminophen 500 MG tablet Commonly known as: TYLENOL Take 1 tablet (500 mg total) by mouth every 4 (four) hours as needed.   atorvastatin 80 MG tablet Commonly known as: LIPITOR Take 1 tablet (80 mg total) by mouth at bedtime.   clopidogrel 75 MG tablet Commonly known as: PLAVIX Take 1 tablet (75 mg total) by mouth daily.   Co Q10 100 MG Caps Take 1 capsule by mouth daily.   D3-1000 PO Take 1,000 Units by mouth daily.   dalfampridine 10 MG Tb12 Take 10 mg by mouth every 12 (twelve) hours.   DULoxetine 60 MG capsule Commonly known as: CYMBALTA Take 1 capsule (60 mg total) by mouth daily.   famotidine 20 MG tablet Commonly known as: PEPCID Take 1 tablet (20 mg total) by mouth 2 (two) times daily.   fexofenadine 180 MG tablet Commonly known as: Allegra Allergy Take 1 tablet (180 mg total) by mouth daily.   fluticasone 50 MCG/ACT nasal spray Commonly known as: FLONASE Place 1 spray into both nostrils daily.   furosemide 40 MG tablet Commonly known as:  LASIX Take 1 tablet (40 mg total) by mouth daily. Start taking on: December 28, 2020   Magnesium Oxide 400 (240 Mg) MG Tabs TAKE ONE TABLET EVERY DAY   mirtazapine 45 MG tablet Commonly known as: REMERON Take 1 tablet (45 mg total) by mouth at bedtime. What changed:   medication strength  how much to take   QUEtiapine 25 MG tablet Commonly known as: SEROQUEL Take 1/2 pill in morning and a whole pill at bedtime.   saccharomyces boulardii 250 MG capsule Commonly known as: FLORASTOR Take 1 capsule (250 mg total) by mouth 2 (two) times daily.   sodium chloride 0.65 % nasal spray Commonly known as: OCEAN Place 1-2 sprays into the nose as needed.       Follow-up Information    Raulkar, Clide Deutscher, MD Follow up.   Specialty: Physical Medicine and Rehabilitation Why: 01/18/21 to arrive at  9:40 for 10:00am appointment Contact information: [address] [place] [address] [phone]        Einar Pheasant, MD. Call.   Specialty: Internal Medicine Why: for post hospital follow up and for lab/BMET.  Contact information: [address] [place] [address] [phone]               Signed: Bary Leriche 12/27/2020, 10:37 AM

## 2020-12-28 DIAGNOSIS — F482 Pseudobulbar affect: Secondary | ICD-10-CM | POA: Diagnosis not present

## 2020-12-28 DIAGNOSIS — Z8701 Personal history of pneumonia (recurrent): Secondary | ICD-10-CM | POA: Diagnosis not present

## 2020-12-28 DIAGNOSIS — I69351 Hemiplegia and hemiparesis following cerebral infarction affecting right dominant side: Secondary | ICD-10-CM | POA: Diagnosis not present

## 2020-12-28 DIAGNOSIS — Z7902 Long term (current) use of antithrombotics/antiplatelets: Secondary | ICD-10-CM | POA: Diagnosis not present

## 2020-12-28 DIAGNOSIS — E7800 Pure hypercholesterolemia, unspecified: Secondary | ICD-10-CM | POA: Diagnosis not present

## 2020-12-28 DIAGNOSIS — I10 Essential (primary) hypertension: Secondary | ICD-10-CM | POA: Diagnosis not present

## 2020-12-28 DIAGNOSIS — Z8619 Personal history of other infectious and parasitic diseases: Secondary | ICD-10-CM | POA: Diagnosis not present

## 2020-12-28 DIAGNOSIS — G35 Multiple sclerosis: Secondary | ICD-10-CM | POA: Diagnosis not present

## 2020-12-28 DIAGNOSIS — F411 Generalized anxiety disorder: Secondary | ICD-10-CM | POA: Diagnosis not present

## 2020-12-28 DIAGNOSIS — Z79899 Other long term (current) drug therapy: Secondary | ICD-10-CM | POA: Diagnosis not present

## 2020-12-28 DIAGNOSIS — M21372 Foot drop, left foot: Secondary | ICD-10-CM | POA: Diagnosis not present

## 2020-12-28 DIAGNOSIS — E785 Hyperlipidemia, unspecified: Secondary | ICD-10-CM | POA: Diagnosis not present

## 2020-12-28 DIAGNOSIS — Z981 Arthrodesis status: Secondary | ICD-10-CM | POA: Diagnosis not present

## 2020-12-28 DIAGNOSIS — Z86010 Personal history of colonic polyps: Secondary | ICD-10-CM | POA: Diagnosis not present

## 2020-12-28 DIAGNOSIS — K219 Gastro-esophageal reflux disease without esophagitis: Secondary | ICD-10-CM | POA: Diagnosis not present

## 2020-12-28 DIAGNOSIS — M949 Disorder of cartilage, unspecified: Secondary | ICD-10-CM | POA: Diagnosis not present

## 2020-12-28 DIAGNOSIS — N83202 Unspecified ovarian cyst, left side: Secondary | ICD-10-CM | POA: Diagnosis not present

## 2020-12-28 DIAGNOSIS — E559 Vitamin D deficiency, unspecified: Secondary | ICD-10-CM | POA: Diagnosis not present

## 2020-12-28 DIAGNOSIS — F32A Depression, unspecified: Secondary | ICD-10-CM | POA: Diagnosis not present

## 2020-12-28 DIAGNOSIS — M899 Disorder of bone, unspecified: Secondary | ICD-10-CM | POA: Diagnosis not present

## 2020-12-28 DIAGNOSIS — G43909 Migraine, unspecified, not intractable, without status migrainosus: Secondary | ICD-10-CM | POA: Diagnosis not present

## 2020-12-28 DIAGNOSIS — M5136 Other intervertebral disc degeneration, lumbar region: Secondary | ICD-10-CM | POA: Diagnosis not present

## 2020-12-28 DIAGNOSIS — Z9049 Acquired absence of other specified parts of digestive tract: Secondary | ICD-10-CM | POA: Diagnosis not present

## 2020-12-28 DIAGNOSIS — N83201 Unspecified ovarian cyst, right side: Secondary | ICD-10-CM | POA: Diagnosis not present

## 2020-12-28 DIAGNOSIS — J302 Other seasonal allergic rhinitis: Secondary | ICD-10-CM | POA: Diagnosis not present

## 2020-12-31 ENCOUNTER — Telehealth: Payer: Self-pay

## 2020-12-31 ENCOUNTER — Ambulatory Visit: Admit: 2020-12-31 | Payer: Medicare Other | Admitting: Internal Medicine

## 2020-12-31 ENCOUNTER — Telehealth: Payer: Self-pay | Admitting: *Deleted

## 2020-12-31 DIAGNOSIS — H6123 Impacted cerumen, bilateral: Secondary | ICD-10-CM | POA: Diagnosis not present

## 2020-12-31 DIAGNOSIS — R42 Dizziness and giddiness: Secondary | ICD-10-CM | POA: Diagnosis not present

## 2020-12-31 SURGERY — COLONOSCOPY WITH PROPOFOL
Anesthesia: General

## 2020-12-31 NOTE — Telephone Encounter (Signed)
Transition Care Management Follow-up Telephone Call  Date of discharge and from where: 12/27/20 from [facility].  How have you been since you were released from the hospital? Patient states,"I still have right arm weakness and my lips feel a little numb.I know I did this to myself because I was so angry." Denies headache, dizziness, confusion, slurred speech, blurred vision, chest pain. Input/output appropriate.   Any questions or concerns? No  Items Reviewed:  Did the pt receive and understand the discharge instructions provided? Yes   Medications obtained and verified? Yes   Any new allergies since your discharge? No   Dietary orders reviewed? Regular  Do you have support at home? Yes    Home Care and Equipment/Supplies: Were home health services ordered?  Speech/PT/OT If so, what is the [facility] of the [facility]? [facility]  Has the agency set up a time to come to the patient's home? This Wed 01/01/21 at 10:30.  Functional Questionnaire: (I = Independent and D = Dependent) ADLs: Assistance available as needed.   Bathing/Dressing- D  Meal Prep- D   Eating- I  Maintaining continence- I  Transferring/Ambulation- wheelchair, walker  Managing Meds- son is managing  Follow up appointments reviewed:   PCP Hospital f/u appt confirmed? Yes  Scheduled to see Dr. Nicki Reaper on 01/02/21 @ 7:30. Wants to keep 01/16/21 appointment until seeing pcp for hfu.   Therapist for counseling post discharge ? No . Plans to follow up after visit with pcp per request.   Are transportation arrangements needed? No   If their condition worsens, is the pt aware to call PCP or go to the Emergency Dept.? Yes  Was the patient provided with contact information for the PCP's office or ED? Yes  Was to pt encouraged to call back with questions or concerns? Yes

## 2020-12-31 NOTE — Telephone Encounter (Signed)
Is this in office of virtual.  If in office, please notify pt to arrive by 7:15.  Thanks.

## 2020-12-31 NOTE — Telephone Encounter (Signed)
Anderson Malta ST [facility] called for POC 1wk4 for cognition.  Approval given.

## 2021-01-01 DIAGNOSIS — I10 Essential (primary) hypertension: Secondary | ICD-10-CM | POA: Diagnosis not present

## 2021-01-01 DIAGNOSIS — F482 Pseudobulbar affect: Secondary | ICD-10-CM | POA: Diagnosis not present

## 2021-01-01 DIAGNOSIS — M21372 Foot drop, left foot: Secondary | ICD-10-CM | POA: Diagnosis not present

## 2021-01-01 DIAGNOSIS — E785 Hyperlipidemia, unspecified: Secondary | ICD-10-CM | POA: Diagnosis not present

## 2021-01-01 DIAGNOSIS — G35 Multiple sclerosis: Secondary | ICD-10-CM | POA: Diagnosis not present

## 2021-01-01 DIAGNOSIS — I69351 Hemiplegia and hemiparesis following cerebral infarction affecting right dominant side: Secondary | ICD-10-CM | POA: Diagnosis not present

## 2021-01-01 NOTE — Telephone Encounter (Signed)
Pt will be doing a virtual

## 2021-01-02 ENCOUNTER — Telehealth: Payer: Self-pay

## 2021-01-02 ENCOUNTER — Other Ambulatory Visit: Payer: Self-pay

## 2021-01-02 ENCOUNTER — Encounter: Payer: Self-pay | Admitting: Internal Medicine

## 2021-01-02 ENCOUNTER — Telehealth (INDEPENDENT_AMBULATORY_CARE_PROVIDER_SITE_OTHER): Payer: Medicare Other | Admitting: Internal Medicine

## 2021-01-02 DIAGNOSIS — Z8673 Personal history of transient ischemic attack (TIA), and cerebral infarction without residual deficits: Secondary | ICD-10-CM

## 2021-01-02 DIAGNOSIS — F339 Major depressive disorder, recurrent, unspecified: Secondary | ICD-10-CM

## 2021-01-02 DIAGNOSIS — M21372 Foot drop, left foot: Secondary | ICD-10-CM | POA: Diagnosis not present

## 2021-01-02 DIAGNOSIS — F32A Depression, unspecified: Secondary | ICD-10-CM | POA: Diagnosis not present

## 2021-01-02 DIAGNOSIS — F419 Anxiety disorder, unspecified: Secondary | ICD-10-CM | POA: Diagnosis not present

## 2021-01-02 DIAGNOSIS — K5900 Constipation, unspecified: Secondary | ICD-10-CM | POA: Diagnosis not present

## 2021-01-02 DIAGNOSIS — E785 Hyperlipidemia, unspecified: Secondary | ICD-10-CM | POA: Diagnosis not present

## 2021-01-02 DIAGNOSIS — F482 Pseudobulbar affect: Secondary | ICD-10-CM | POA: Diagnosis not present

## 2021-01-02 DIAGNOSIS — I10 Essential (primary) hypertension: Secondary | ICD-10-CM | POA: Diagnosis not present

## 2021-01-02 DIAGNOSIS — R7989 Other specified abnormal findings of blood chemistry: Secondary | ICD-10-CM

## 2021-01-02 DIAGNOSIS — I69351 Hemiplegia and hemiparesis following cerebral infarction affecting right dominant side: Secondary | ICD-10-CM | POA: Diagnosis not present

## 2021-01-02 DIAGNOSIS — R945 Abnormal results of liver function studies: Secondary | ICD-10-CM | POA: Diagnosis not present

## 2021-01-02 DIAGNOSIS — G35 Multiple sclerosis: Secondary | ICD-10-CM | POA: Diagnosis not present

## 2021-01-02 NOTE — Progress Notes (Signed)
Patient states the rehab in [place] put her on losartan however this was not on med list and was not sent in when she went home.

## 2021-01-02 NOTE — Telephone Encounter (Signed)
Discharge note reviewed per protocol:   Verbal okay given for Carly [name] to receive Occupational Therapy, twice a week for 4 weeks.   Call back ph# [phone].

## 2021-01-02 NOTE — Progress Notes (Signed)
Patient ID: Carly [name], female   DOB: 1951-02-23, 70 y.o.   MRN: [id number]   Virtual Visit via video Note  This visit type was conducted due to national recommendations for restrictions regarding the COVID-19 pandemic (e.g. social distancing).  This format is felt to be most appropriate for this patient at this time.  All issues noted in this document were discussed and addressed.  No physical exam was performed (except for noted visual exam findings with Video Visits).   I connected with Carly [name] by a video enabled telemedicine application and verified that I am speaking with the correct person using two identifiers. Location patient: home Location provider: work  Persons participating in the virtual visit: patient, provider  The limitations, risks, security and privacy concerns of performing an evaluation and management service by video and the availability of in person appointments have been discussed.  It has also been discussed with the patient that there may be a patient responsible charge related to this service. The patient expressed understanding and agreed to proceed.   Reason for visit: hospital follow up  HPI: Admitted 12/08/20 to [facility] with weakness, dizziness and mental status changes.  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  Neurology consulted and felt that the stoke was due to small vessel disease and plavix was added for secondary stroke prevention. Carotid duplex - no significant stenosis.  ECHO EF 60-65%.  No embolism.  No shunt.  Started on lipitor.  Felt she needed aggressive PT/OT and was admitted to inpatient rehab - 12/13/20 - 12/27/20 - for PT, ST and OT.  Had problems with lower extremity edema and lasix added.  Lower extremity duplex negative for DVT.  seroquel added at bedtime to help manage anxiety.  Continued on remeron.  Left AFO ordered to help with foot drop and gait. Will continue to need home health PT, OT  and ST.  While in the hospital her remeron and cymbalta dose - also increased.  She states since being home, she has noticed some episodes of increased crying.  She relates to having the stroke. She is also anxious today because she is not taking a blood pressure medication.  States she thought she was on losartan.  Reports blood pressure yesterday 121/70.  She is walking - with a walker.  Needs help dressing.  No chest pain or sob reported.  No abdominal pain.  Bowels moving.  Has f/u with neurology on 01/09/21.    ROS: See pertinent positives and negatives per HPI.  Past Medical History:  Diagnosis Date  . Allergy   . Anxiety   . Aspiration pneumonia ([place])   . Depression   . Frequent headaches    H/O  . GERD (gastroesophageal reflux disease)   . History of chicken pox   . History of colon polyps   . Hx of migraines   . Multiple sclerosis ([place])   . PONV (postoperative nausea and vomiting)     Past Surgical History:  Procedure Laterality Date  . BACK SURGERY    . CHOLECYSTECTOMY    . COLONOSCOPY WITH PROPOFOL N/A 05/18/2017   Procedure: COLONOSCOPY WITH PROPOFOL;  Surgeon: [name], MD;  Location: [facility] ENDOSCOPY;  Service: Endoscopy;  Laterality: N/A;  . FOOT SURGERY  2015  . GALLBLADDER SURGERY  2008  . HARDWARE REMOVAL Left 02/14/2016   Procedure: LEFT FOOT REMOVAL DEEP IMPLANT;  Surgeon: [name], MD;  Location: [place];  Service: Orthopedics;  Laterality: Left;  . HERNIA REPAIR     Inguinal Hernia Repair  . SPINE SURGERY  2014    Family History  Problem Relation Age of Onset  . Arthritis Mother   . Hypertension Mother   . Macular degeneration Mother   . Hypertension Father   . Hyperlipidemia Father   . Heart disease Maternal Grandfather   . Diabetes Maternal Grandfather   . Kidney disease Paternal Grandmother     SOCIAL HX: reviewed.    Current Outpatient Medications:  .  acetaminophen (TYLENOL) 500 MG tablet, Take 1 tablet (500 mg  total) by mouth every 4 (four) hours as needed., Disp: 30 tablet, Rfl: 0 .  atorvastatin (LIPITOR) 80 MG tablet, Take 1 tablet (80 mg total) by mouth at bedtime., Disp: 30 tablet, Rfl: 0 .  Cholecalciferol (D3-1000 PO), Take 1,000 Units by mouth daily., Disp: , Rfl:  .  clopidogrel (PLAVIX) 75 MG tablet, Take 1 tablet (75 mg total) by mouth daily., Disp: 30 tablet, Rfl: 0 .  Coenzyme Q10 (CO Q10) 100 MG CAPS, Take 1 capsule by mouth daily., Disp: , Rfl:  .  dalfampridine 10 MG TB12, Take 10 mg by mouth every 12 (twelve) hours., Disp: , Rfl:  .  docusate sodium (COLACE) 100 MG capsule, Take 100 mg by mouth 2 (two) times daily., Disp: , Rfl:  .  DULoxetine (CYMBALTA) 60 MG capsule, Take 1 capsule (60 mg total) by mouth daily., Disp: 30 capsule, Rfl: 0 .  famotidine (PEPCID) 20 MG tablet, Take 1 tablet (20 mg total) by mouth 2 (two) times daily., Disp: 60 tablet, Rfl: 1 .  fexofenadine (ALLEGRA ALLERGY) 180 MG tablet, Take 1 tablet (180 mg total) by mouth daily., Disp: 30 tablet, Rfl: 2 .  fluticasone (FLONASE) 50 MCG/ACT nasal spray, Place 1 spray into both nostrils daily., Disp: 16 g, Rfl: 2 .  furosemide (LASIX) 40 MG tablet, Take 1 tablet (40 mg total) by mouth daily., Disp: 30 tablet, Rfl: 0 .  Magnesium Oxide 400 (240 Mg) MG TABS, TAKE ONE TABLET EVERY DAY, Disp: 30 tablet, Rfl: 1 .  METAMUCIL FIBER PO, Take by mouth., Disp: , Rfl:  .  mirtazapine (REMERON) 45 MG tablet, Take 1 tablet (45 mg total) by mouth at bedtime., Disp: 30 tablet, Rfl: 0 .  QUEtiapine (SEROQUEL) 25 MG tablet, Take 1/2 pill in morning and a whole pill at bedtime., Disp: 45 tablet, Rfl: 0 .  saccharomyces boulardii (FLORASTOR) 250 MG capsule, Take 1 capsule (250 mg total) by mouth 2 (two) times daily., Disp: 60 capsule, Rfl: 0 .  amLODipine (NORVASC) 2.5 MG tablet, Take 1 tablet (2.5 mg total) by mouth daily., Disp: 30 tablet, Rfl: 1 .  sodium chloride (OCEAN) 0.65 % nasal spray, Place 1-2 sprays into the nose as needed.  (Patient not taking: Reported on 01/02/2021), Disp: , Rfl:  No current facility-administered medications for this visit.  Facility-Administered Medications Ordered in Other Visits:  .  0.9 %  sodium chloride infusion, , Intravenous, Continuous, Corcoran, Melissa C, MD, Last Rate: 10 mL/hr at 04/25/16 0855, New Bag at 04/25/16 0855 .  acetaminophen (TYLENOL) tablet 650 mg, 650 mg, Oral, Once, Corcoran, Melissa C, MD  EXAM:  VITALS per patient if applicable: 458/09  GENERAL: alert, oriented, appears well and in no acute distress  HEENT: atraumatic, conjunttiva clear, no obvious abnormalities on inspection of external nose and ears  NECK: normal movements of the head and neck  LUNGS: on inspection no  signs of respiratory distress, breathing rate appears normal, no obvious gross SOB, gasping or wheezing  CV: no obvious cyanosis  PSYCH/NEURO: pleasant and cooperative, no obvious depression or anxiety, speech and thought processing grossly intact  ASSESSMENT AND PLAN:  Discussed the following assessment and plan:  Problem List Items Addressed This Visit    Abnormal liver function tests    Diet and exercise.  Follow liver function tests.        Anxiety and depression    Followed by psychiatry.  cymbalta and remeron doses increased while in hospital.  Follow.        BP (high blood pressure)    She is concerned regarding her blood pressure.  Will continue lasix.  She will monitor her blood pressure.  Yesterday's check  - 121/70.  Hold on additional medication at this time.  She will spot check her pressure and call in readings over the next week.  If persistent elevation, will adjust medication.        Constipation    Bowels doing well.  No problems with constipation now.  Follow.        Depression, recurrent ([place])    Followed by psychiatry.  Continues on cymbalta and remeron.  Doses increased in hospital.  Follow.        History of CVA (cerebrovascular accident)    Recently  admitted as outlined.  Discharged from [facility] to rehab.  Improved, but continues to need PT, OT and ST.  Arrange home health therapy.  Continue to use walker.  Follow.            I discussed the assessment and treatment plan with the patient. The patient was provided an opportunity to ask questions and all were answered. The patient agreed with the plan and demonstrated an understanding of the instructions.   The patient was advised to call back or seek an in-person evaluation if the symptoms worsen or if the condition fails to improve as anticipated.    Einar Pheasant, MD

## 2021-01-03 ENCOUNTER — Telehealth: Payer: Self-pay

## 2021-01-03 DIAGNOSIS — F32A Depression, unspecified: Secondary | ICD-10-CM

## 2021-01-03 DIAGNOSIS — Z8619 Personal history of other infectious and parasitic diseases: Secondary | ICD-10-CM

## 2021-01-03 DIAGNOSIS — G35 Multiple sclerosis: Secondary | ICD-10-CM | POA: Diagnosis not present

## 2021-01-03 DIAGNOSIS — I10 Essential (primary) hypertension: Secondary | ICD-10-CM | POA: Diagnosis not present

## 2021-01-03 DIAGNOSIS — G43909 Migraine, unspecified, not intractable, without status migrainosus: Secondary | ICD-10-CM | POA: Diagnosis not present

## 2021-01-03 DIAGNOSIS — M21372 Foot drop, left foot: Secondary | ICD-10-CM | POA: Diagnosis not present

## 2021-01-03 DIAGNOSIS — I69351 Hemiplegia and hemiparesis following cerebral infarction affecting right dominant side: Secondary | ICD-10-CM | POA: Diagnosis not present

## 2021-01-03 DIAGNOSIS — F411 Generalized anxiety disorder: Secondary | ICD-10-CM

## 2021-01-03 DIAGNOSIS — N83202 Unspecified ovarian cyst, left side: Secondary | ICD-10-CM

## 2021-01-03 DIAGNOSIS — M949 Disorder of cartilage, unspecified: Secondary | ICD-10-CM

## 2021-01-03 DIAGNOSIS — E559 Vitamin D deficiency, unspecified: Secondary | ICD-10-CM | POA: Diagnosis not present

## 2021-01-03 DIAGNOSIS — F482 Pseudobulbar affect: Secondary | ICD-10-CM | POA: Diagnosis not present

## 2021-01-03 DIAGNOSIS — J302 Other seasonal allergic rhinitis: Secondary | ICD-10-CM | POA: Diagnosis not present

## 2021-01-03 DIAGNOSIS — Z8701 Personal history of pneumonia (recurrent): Secondary | ICD-10-CM

## 2021-01-03 DIAGNOSIS — E785 Hyperlipidemia, unspecified: Secondary | ICD-10-CM | POA: Diagnosis not present

## 2021-01-03 DIAGNOSIS — M5136 Other intervertebral disc degeneration, lumbar region: Secondary | ICD-10-CM | POA: Diagnosis not present

## 2021-01-03 DIAGNOSIS — M899 Disorder of bone, unspecified: Secondary | ICD-10-CM

## 2021-01-03 DIAGNOSIS — N83201 Unspecified ovarian cyst, right side: Secondary | ICD-10-CM

## 2021-01-03 DIAGNOSIS — K219 Gastro-esophageal reflux disease without esophagitis: Secondary | ICD-10-CM | POA: Diagnosis not present

## 2021-01-03 DIAGNOSIS — Z86010 Personal history of colonic polyps: Secondary | ICD-10-CM

## 2021-01-03 DIAGNOSIS — E7800 Pure hypercholesterolemia, unspecified: Secondary | ICD-10-CM | POA: Diagnosis not present

## 2021-01-03 MED ORDER — AMLODIPINE BESYLATE 2.5 MG PO TABS: 2.5000 mg | ORAL_TABLET | Freq: Every day | ORAL | 1 refills | Status: DC

## 2021-01-03 NOTE — Telephone Encounter (Signed)
Notify pt that given that her blood pressure has been elevated, I would like to start her on a blood pressure medication.  Has she ever had a blood pressure medication she was allergic to.  If no, I would like to start amlodipine 2.5mg  q day.  This is a low dose.  We can adjust dosing if needed.

## 2021-01-03 NOTE — Telephone Encounter (Signed)
Spoken to patient, she stated her current BP is 150/89. She is not having any sx of headaches, blurred vision, chest px, arm px, jaw px, heart palpitation, and SOB. She stated Dr Nicki Reaper wanted an update on her BP. She stated she was very worried about her earlier BP of 151/80 due to hx of stroke.Pt stated she will go to UC if she has sx.

## 2021-01-03 NOTE — Telephone Encounter (Signed)
Please call her

## 2021-01-03 NOTE — Telephone Encounter (Signed)
rx sent in for low dose amlodipine.

## 2021-01-03 NOTE — Telephone Encounter (Signed)
Pt called to give BP reading 151/80 Pt would like a call back today-she states that she is upset about blood pressure

## 2021-01-03 NOTE — Telephone Encounter (Signed)
Patient is ok with BP medication. [facility]

## 2021-01-05 ENCOUNTER — Encounter: Payer: Self-pay | Admitting: Internal Medicine

## 2021-01-05 DIAGNOSIS — Z8673 Personal history of transient ischemic attack (TIA), and cerebral infarction without residual deficits: Secondary | ICD-10-CM | POA: Insufficient documentation

## 2021-01-05 NOTE — Assessment & Plan Note (Signed)
She is concerned regarding her blood pressure.  Will continue lasix.  She will monitor her blood pressure.  Yesterday's check  - 121/70.  Hold on additional medication at this time.  She will spot check her pressure and call in readings over the next week.  If persistent elevation, will adjust medication.

## 2021-01-05 NOTE — Assessment & Plan Note (Signed)
Bowels doing well.  No problems with constipation now.  Follow.

## 2021-01-05 NOTE — Assessment & Plan Note (Signed)
Recently admitted as outlined.  Discharged from [facility] to rehab.  Improved, but continues to need PT, OT and ST.  Arrange home health therapy.  Continue to use walker.  Follow.

## 2021-01-05 NOTE — Assessment & Plan Note (Signed)
Followed by psychiatry.  cymbalta and remeron doses increased while in hospital.  Follow.

## 2021-01-05 NOTE — Assessment & Plan Note (Signed)
Diet and exercise.  Follow liver function tests.   

## 2021-01-05 NOTE — Assessment & Plan Note (Signed)
Followed by psychiatry.  Continues on cymbalta and remeron.  Doses increased in hospital.  Follow.

## 2021-01-07 ENCOUNTER — Telehealth: Payer: Self-pay | Admitting: *Deleted

## 2021-01-07 NOTE — Telephone Encounter (Signed)
Tiffany from [facility] called to request permission to cancel therapies for this week (PT/OT/ST) at the request of the patient for this week only.  They need verbal OK from MD. Please advise.

## 2021-01-07 NOTE — Telephone Encounter (Signed)
Notified ok, per Dr Posey Pronto.

## 2021-01-08 NOTE — Telephone Encounter (Signed)
Patient's husband called back to report the patient's BP reading to see if medication changes need to be made.  01/01/21 - 121/70 01/02/21 - 147/80 am 01/02/21 - 145/88 pm 01/03/21 - 151/80 pm 01/03/21- 141/77     8 pm 01/04/21 - 152/83    9:45 am 01/04/21 -149/87     9:05 pm - 01/06/21 - 152/89    10:15 am 01/06/21 -  131/78    3:45 pm 01/07/21 -150/98     10:30 am 01/08/01-  146/90    12:30 pm

## 2021-01-08 NOTE — Telephone Encounter (Signed)
BP is still staying elevated with the 2.5 mg amlodipine. Husband is wondering if dose needs to be increased.

## 2021-01-08 NOTE — Telephone Encounter (Signed)
Please call her

## 2021-01-08 NOTE — Telephone Encounter (Signed)
Patient has been notified

## 2021-01-08 NOTE — Telephone Encounter (Signed)
Reviewed blood pressures.  Ok to increase amlodipine to 5mg  q day.  Let me know if any problems.

## 2021-01-14 ENCOUNTER — Other Ambulatory Visit: Payer: Self-pay | Admitting: Internal Medicine

## 2021-01-14 DIAGNOSIS — E785 Hyperlipidemia, unspecified: Secondary | ICD-10-CM | POA: Diagnosis not present

## 2021-01-14 DIAGNOSIS — M21372 Foot drop, left foot: Secondary | ICD-10-CM | POA: Diagnosis not present

## 2021-01-14 DIAGNOSIS — I10 Essential (primary) hypertension: Secondary | ICD-10-CM | POA: Diagnosis not present

## 2021-01-14 DIAGNOSIS — F482 Pseudobulbar affect: Secondary | ICD-10-CM | POA: Diagnosis not present

## 2021-01-14 DIAGNOSIS — G35 Multiple sclerosis: Secondary | ICD-10-CM | POA: Diagnosis not present

## 2021-01-14 DIAGNOSIS — I69351 Hemiplegia and hemiparesis following cerebral infarction affecting right dominant side: Secondary | ICD-10-CM | POA: Diagnosis not present

## 2021-01-15 ENCOUNTER — Telehealth: Payer: Self-pay | Admitting: Internal Medicine

## 2021-01-15 DIAGNOSIS — G35 Multiple sclerosis: Secondary | ICD-10-CM | POA: Diagnosis not present

## 2021-01-15 DIAGNOSIS — M5413 Radiculopathy, cervicothoracic region: Secondary | ICD-10-CM | POA: Diagnosis not present

## 2021-01-15 DIAGNOSIS — I69351 Hemiplegia and hemiparesis following cerebral infarction affecting right dominant side: Secondary | ICD-10-CM | POA: Diagnosis not present

## 2021-01-15 DIAGNOSIS — M9900 Segmental and somatic dysfunction of head region: Secondary | ICD-10-CM | POA: Diagnosis not present

## 2021-01-15 DIAGNOSIS — I10 Essential (primary) hypertension: Secondary | ICD-10-CM | POA: Diagnosis not present

## 2021-01-15 DIAGNOSIS — M7918 Myalgia, other site: Secondary | ICD-10-CM | POA: Diagnosis not present

## 2021-01-15 DIAGNOSIS — F482 Pseudobulbar affect: Secondary | ICD-10-CM | POA: Diagnosis not present

## 2021-01-15 DIAGNOSIS — M542 Cervicalgia: Secondary | ICD-10-CM | POA: Diagnosis not present

## 2021-01-15 DIAGNOSIS — E785 Hyperlipidemia, unspecified: Secondary | ICD-10-CM | POA: Diagnosis not present

## 2021-01-15 DIAGNOSIS — M5412 Radiculopathy, cervical region: Secondary | ICD-10-CM | POA: Diagnosis not present

## 2021-01-15 DIAGNOSIS — M21372 Foot drop, left foot: Secondary | ICD-10-CM | POA: Diagnosis not present

## 2021-01-15 DIAGNOSIS — M9901 Segmental and somatic dysfunction of cervical region: Secondary | ICD-10-CM | POA: Diagnosis not present

## 2021-01-15 DIAGNOSIS — R519 Headache, unspecified: Secondary | ICD-10-CM | POA: Diagnosis not present

## 2021-01-15 NOTE — Telephone Encounter (Signed)
Pt called she wanted to know if it was ok for her to go to the Chiropractor after having a stroke

## 2021-01-15 NOTE — Telephone Encounter (Signed)
Left detailed message for patient.

## 2021-01-15 NOTE — Telephone Encounter (Signed)
Are you okay with her going back to her chiropractor since she had a stroke?

## 2021-01-15 NOTE — Telephone Encounter (Signed)
What symptoms is she having and what is she being seen for?  If has upcoming appt, can d/w her more at her appt.

## 2021-01-16 ENCOUNTER — Ambulatory Visit: Payer: Medicare Other | Admitting: Internal Medicine

## 2021-01-16 DIAGNOSIS — G35 Multiple sclerosis: Secondary | ICD-10-CM | POA: Diagnosis not present

## 2021-01-16 DIAGNOSIS — F482 Pseudobulbar affect: Secondary | ICD-10-CM | POA: Diagnosis not present

## 2021-01-16 DIAGNOSIS — M21372 Foot drop, left foot: Secondary | ICD-10-CM | POA: Diagnosis not present

## 2021-01-16 DIAGNOSIS — E785 Hyperlipidemia, unspecified: Secondary | ICD-10-CM | POA: Diagnosis not present

## 2021-01-16 DIAGNOSIS — I10 Essential (primary) hypertension: Secondary | ICD-10-CM | POA: Diagnosis not present

## 2021-01-16 DIAGNOSIS — I69351 Hemiplegia and hemiparesis following cerebral infarction affecting right dominant side: Secondary | ICD-10-CM | POA: Diagnosis not present

## 2021-01-17 ENCOUNTER — Telehealth: Payer: Self-pay

## 2021-01-17 DIAGNOSIS — G35 Multiple sclerosis: Secondary | ICD-10-CM | POA: Diagnosis not present

## 2021-01-17 DIAGNOSIS — I69351 Hemiplegia and hemiparesis following cerebral infarction affecting right dominant side: Secondary | ICD-10-CM | POA: Diagnosis not present

## 2021-01-17 DIAGNOSIS — I10 Essential (primary) hypertension: Secondary | ICD-10-CM | POA: Diagnosis not present

## 2021-01-17 DIAGNOSIS — E785 Hyperlipidemia, unspecified: Secondary | ICD-10-CM | POA: Diagnosis not present

## 2021-01-17 DIAGNOSIS — M21372 Foot drop, left foot: Secondary | ICD-10-CM | POA: Diagnosis not present

## 2021-01-17 DIAGNOSIS — F482 Pseudobulbar affect: Secondary | ICD-10-CM | POA: Diagnosis not present

## 2021-01-17 NOTE — Telephone Encounter (Signed)
Anderson Malta, ST from [facility] called requesting HHST 1wk4. Patient appt cancel 4/8 for Dr. Ranell Patrick. Has an appt in August to see [name]. Is this therapy okay?

## 2021-01-17 NOTE — Telephone Encounter (Signed)
Patients needs f/u eval prior to recert- can be via phone if it is difficult for her to come in!

## 2021-01-18 ENCOUNTER — Encounter: Payer: Medicare Other | Admitting: Physical Medicine and Rehabilitation

## 2021-01-18 NOTE — Telephone Encounter (Signed)
Called Anderson Malta left to tell her that Dr. Ranell Patrick is denying ST at this time, pt needs an appt.  I called Bill, husband of patient to tell him to call and schedule an appt with Dr. Ranell Patrick and that phone visit is ok if have transport issues.

## 2021-01-19 ENCOUNTER — Other Ambulatory Visit: Payer: Self-pay | Admitting: Physical Medicine and Rehabilitation

## 2021-01-21 ENCOUNTER — Telehealth: Payer: Self-pay

## 2021-01-21 ENCOUNTER — Encounter: Payer: Self-pay | Admitting: Physical Medicine and Rehabilitation

## 2021-01-21 ENCOUNTER — Encounter
Payer: Medicare Other | Attending: Physical Medicine and Rehabilitation | Admitting: Physical Medicine and Rehabilitation

## 2021-01-21 ENCOUNTER — Other Ambulatory Visit: Payer: Self-pay

## 2021-01-21 ENCOUNTER — Telehealth: Payer: Self-pay | Admitting: Internal Medicine

## 2021-01-21 ENCOUNTER — Other Ambulatory Visit: Payer: Self-pay | Admitting: Internal Medicine

## 2021-01-21 DIAGNOSIS — F411 Generalized anxiety disorder: Secondary | ICD-10-CM | POA: Diagnosis not present

## 2021-01-21 MED ORDER — AMLODIPINE BESYLATE 5 MG PO TABS: 5.0000 mg | ORAL_TABLET | Freq: Every day | ORAL | 1 refills | Status: DC

## 2021-01-21 NOTE — Telephone Encounter (Signed)
rx sent in for amlodipine 5mg  q day #90 with one refill.

## 2021-01-21 NOTE — Telephone Encounter (Signed)
Noted.  Appears to be better when PT takes her pressure.  Confirm no acute issues.  If no, will f/u at her appt with bp recheck, etc.  Can adjust medication if needed.

## 2021-01-21 NOTE — Telephone Encounter (Signed)
Patient would like to know if we can send a rx of 5mg  of norvasc due to rehab upping dosage. Pharmacy has given loaner dose until today. Please advise.      [place] Night - Cl TELEPHONE ADVICE RECORD [facility] Patient Name: Carly [name] Gender: Female DOB: 1950-10-29 Age: 70 Y 83 M 6 D Return Phone Number: [phone] (Primary), [phone] (Secondary) Address: City/ State/ Zip: [place] [address] Client [facility] Night - Cl Client Site [place] Physician Einar Pheasant - MD Contact Type Call Who Is Calling Patient / Member / Family / Caregiver Call Type Triage / Clinical Caller Name Kyelle Urbas Relationship To Patient Spouse Return Phone Number [phone] (Secondary) Chief Complaint Prescription Refill or Medication Request (non symptomatic) Reason for Call Symptomatic / Request for Health Information Initial Comment caller has questions about his wife blood pressure medication, there are no refills available because the doctor had her double her current medication, the pharmacy will give a loaner but needing a new script called in for Amoldipine  patient is s stroke patient Translation No Nurse Assessment Nurse: Harvie Bridge, RN, Beth Date/Time (Eastern Time): 01/19/2021 2:41:48 PM Confirm and document reason for call. If symptomatic, describe symptoms. ---Caller has questions about his wife blood pressure medication, there are no refills available because the doctor had her double her current medication, the pharmacy will give a loaner but needing a new script called in for Amylodipine 2.5 mg, up'd dose to 5mg  PO Once a day.  patient is s stroke patient (Feb 26th) Dr in [place] put her on lipitor and Plavix, she is out of there, needs to know if she is to stay on them? Rehab doctor put her on it with no refills. Drug store has given pt enough to  get through Monday. Pharmacy is on file. [facility]. Scheduled Video appt Wednesday. No new or worsening symptoms. Client Directives: #  Nurse may call in refills on maintenance medications: o Volume sufficient until office opens o Refills only called into pharmacy where originally filled, otherwise will have to call back once the office opens. Does the patient have any new or worsening symptoms? ---No Please document clinical information provided and list any resource used. ---Pharmacy has given loaner doses thru Monday for HTN medication. Pt has 2 medications she was prescribed during rehab that has no refills and they are PLEASE NOTE: All timestamps contained within this report are represented as [place] Standard Time. CONFIDENTIALTY NOTICE: This fax transmission is intended only for the addressee. It contains information that is legally privileged, confidential or otherwise protected from use or disclosure. If you are not the intended recipient, you are strictly prohibited from reviewing, disclosing, copying using or disseminating any of this information or taking any action in reliance on or regarding this information. If you have received this fax in error, please notify us immediately by telephone so that we can arrange for its return to [place]. Phone: [phone], Toll-Free: [phone], Fax: [phone] Page: 2 of 2 Call Id: [id number] Nurse Assessment wondering if she needs to stay on them. Directed to office Monday for answers to prescription medications. Disp. Time Eilene Ghazi Time) Disposition Final User 01/19/2021 2:49:40 PM Clinical Call Yes Newhart, RN, Eustaquio Maize

## 2021-01-21 NOTE — Progress Notes (Signed)
Due to national recommendations of social distancing because of COVID 44, an audio/video tele-health visit is felt to be the most appropriate encounter for this patient at this time. See MyChart message from today for the patient's consent to a tele-health encounter with [place]. This is a follow up tele-visit via phone. The patient is at home. MD is at office.   HPI:  Carly [name] is a 70 year old woman who was admitted to CIR with white matter periventricular CVA.  1) Anxiety:  -her psychiatrist transitioned her from Seroquel back to benzodiazepine due to side effect profile of Seroquel. -since she stopped the Seroquel, her anxiety has increased, and she doesn't know what to do. -she and husband [name] were scared of side effects of Seroquel that were discussed  -she has been sleeping but her legs feel very stiff at night -she continues to take Cymbalta 60mg  and [name] 45mg  as well.  Physical exam not performed as patient was seen via phone.  Assessment/Plan:  1) Anxiety: -discussed with patient the side effects of both Seroquel and Lorazepam, as well as potential interactions with other medications. -advised her to discuss with Bill the risks and benefits of both medications.  -discussed that we can follow-up tomorrow after their conversation to see what they decide -encouraged non-pharmacologic management approaches as well, such as meditation, applying lavender oil, and exercise- all of which she has been trying and which have been helping.  -discussed alternative anxiety medications.   22 minutes spent in discussion of patient's symptoms of anxiety, her response to Seroquel and Lorazepam, review of the side effects of both medications as well as potential interactions with other medications, discussion of non-pharmacologic options for management of anxiety, discussion of alternative anxiety medications

## 2021-01-21 NOTE — Telephone Encounter (Signed)
Patient called stating psychiatrist took her off Seroquel and she is not feeling well after discontinuing. She would like to discuss with you.

## 2021-01-21 NOTE — Telephone Encounter (Signed)
BP reading for appt on 4/13

## 2021-01-21 NOTE — Addendum Note (Signed)
Addended by: Alisa Graff on: 01/21/2021 01:18 PM   Modules accepted: Orders

## 2021-01-21 NOTE — Telephone Encounter (Signed)
Pt called to give BP readings   4/4 - 150/98   142/98 4/5 - 146/90   140/80 4/6 - 133/81 taken by PT  4/7- 142/86   115/70 taken by PT 4/8 - 132/77  4/9-  154/83 4/10-  156/75 4/11-  145/86

## 2021-01-21 NOTE — Telephone Encounter (Signed)
Can you please schedule her for phone visit today or tomorrow at 1:20? Thank you!

## 2021-01-22 ENCOUNTER — Telehealth: Payer: Self-pay

## 2021-01-22 ENCOUNTER — Encounter (HOSPITAL_BASED_OUTPATIENT_CLINIC_OR_DEPARTMENT_OTHER): Payer: Medicare Other | Admitting: Physical Medicine and Rehabilitation

## 2021-01-22 ENCOUNTER — Other Ambulatory Visit: Payer: Self-pay

## 2021-01-22 ENCOUNTER — Encounter: Payer: Self-pay | Admitting: Physical Medicine and Rehabilitation

## 2021-01-22 VITALS — BP 150/80 | HR 87 | Ht 63.0 in | Wt 155.0 lb

## 2021-01-22 DIAGNOSIS — F482 Pseudobulbar affect: Secondary | ICD-10-CM | POA: Diagnosis not present

## 2021-01-22 DIAGNOSIS — F411 Generalized anxiety disorder: Secondary | ICD-10-CM

## 2021-01-22 DIAGNOSIS — G35 Multiple sclerosis: Secondary | ICD-10-CM

## 2021-01-22 DIAGNOSIS — E785 Hyperlipidemia, unspecified: Secondary | ICD-10-CM | POA: Diagnosis not present

## 2021-01-22 DIAGNOSIS — I69351 Hemiplegia and hemiparesis following cerebral infarction affecting right dominant side: Secondary | ICD-10-CM | POA: Diagnosis not present

## 2021-01-22 DIAGNOSIS — I10 Essential (primary) hypertension: Secondary | ICD-10-CM | POA: Diagnosis not present

## 2021-01-22 DIAGNOSIS — M21372 Foot drop, left foot: Secondary | ICD-10-CM | POA: Diagnosis not present

## 2021-01-22 MED ORDER — QUETIAPINE FUMARATE 25 MG PO TABS: ORAL_TABLET | ORAL | 0 refills | Status: DC

## 2021-01-22 NOTE — Telephone Encounter (Signed)
PT is coming out today. Going to check her BP. Patient is worried about her BP. She is going to call back after PT checks it and do in office visit tomorrow.

## 2021-01-22 NOTE — Telephone Encounter (Signed)
[place] Therapist with [facility] called requesting home visits. For speech therapy once a week for 4 weeks.   Call back phone [phone] [place])

## 2021-01-22 NOTE — Progress Notes (Signed)
Subjective:    Patient ID: Carly [name], female    DOB: 1951/09/05, 70 y.o.   MRN: [id number]  HPI  Due to national recommendations of social distancing because of COVID 42, an audio/video tele-health visit is felt to be the most appropriate encounter for this patient at this time. See MyChart message from today for the patient's consent to a tele-health encounter with [facility]. This is a follow up tele-visit via phone. The patient is at home. MD is at office.   Carly [name] is a 70 year old woman who was admitted to CIR with white matter periventricular CVA, presents for f/u regarding anxiety  1) Anxiety:  -her psychiatrist transitioned her from Seroquel back to benzodiazepine due to side effect profile of Seroquel. -since she stopped the Seroquel, her anxiety has increased, and she doesn't know what to do. -she and husband [name] were scared of side effects of Seroquel that were discussed  -she has been sleeping but her legs feel very stiff at night -she continues to take Cymbalta 60mg  and [name] 45mg  as well. -she discussed with her husband, who is also present on this call today, and she would prefer to restart Seroquel and stop Lorazepam.  -she has not had any counseling and would like to restart this.  -she loved speaking with [name] inpatient and would like to follow with him  2) MS: -has been having a lot of stiffness recently -EMS called twice due to difficulty getting her up off the floor -is receiving home therapy    Pain Inventory Average Pain 5 Pain Right Now 5 My pain is constant  In the last 24 hours, has pain interfered with the following? General activity n/a Relation with others n/a Enjoyment of life n/a What TIME of day is your pain at its worst? varies Sleep (in general) NA  Pain is worse with: unsure Pain improves with: n/a Relief from Meds: n/a  Family History  Problem Relation Age of Onset   . Arthritis Mother   . Hypertension Mother   . Macular degeneration Mother   . Hypertension Father   . Hyperlipidemia Father   . Heart disease Maternal Grandfather   . Diabetes Maternal Grandfather   . Kidney disease Paternal Grandmother    Social History   Socioeconomic History  . Marital status: Married    Spouse name: Not on file  . Number of children: Not on file  . Years of education: Not on file  . Highest education level: Not on file  Occupational History  . Not on file  Tobacco Use  . Smoking status: Never Smoker  . Smokeless tobacco: Never Used  Vaping Use  . Vaping Use: Never used  Substance and Sexual Activity  . Alcohol use: No    Alcohol/week: 0.0 standard drinks  . Drug use: No  . Sexual activity: Not Currently  Other Topics Concern  . Not on file  Social History Narrative   Married    Social Determinants of Health   Financial Resource Strain: Low Risk   . Difficulty of Paying Living Expenses: Not hard at all  Food Insecurity: No Food Insecurity  . Worried About Charity fundraiser in the Last Year: Never true  . Ran Out of Food in the Last Year: Never true  Transportation Needs: No Transportation Needs  . Lack of Transportation (Medical): No  . Lack of Transportation (Non-Medical): No  Physical Activity: Not on file  Stress: No  Stress Concern Present  . Feeling of Stress : Not at all  Social Connections: Unknown  . Frequency of Communication with Friends and Family: Not on file  . Frequency of Social Gatherings with Friends and Family: Not on file  . Attends Religious Services: Not on file  . Active Member of Clubs or Organizations: Not on file  . Attends Archivist Meetings: Not on file  . Marital Status: Married   Past Surgical History:  Procedure Laterality Date  . BACK SURGERY    . CHOLECYSTECTOMY    . COLONOSCOPY WITH PROPOFOL N/A 05/18/2017   Procedure: COLONOSCOPY WITH PROPOFOL;  Surgeon: [name], MD;  Location:  [facility] ENDOSCOPY;  Service: Endoscopy;  Laterality: N/A;  . FOOT SURGERY  2015  . GALLBLADDER SURGERY  2008  . HARDWARE REMOVAL Left 02/14/2016   Procedure: LEFT FOOT REMOVAL DEEP IMPLANT;  Surgeon: [name], MD;  Location: [facility];  Service: Orthopedics;  Laterality: Left;  . HERNIA REPAIR     Inguinal Hernia Repair  . SPINE SURGERY  2014   Past Surgical History:  Procedure Laterality Date  . BACK SURGERY    . CHOLECYSTECTOMY    . COLONOSCOPY WITH PROPOFOL N/A 05/18/2017   Procedure: COLONOSCOPY WITH PROPOFOL;  Surgeon: [name], MD;  Location: [facility] ENDOSCOPY;  Service: Endoscopy;  Laterality: N/A;  . FOOT SURGERY  2015  . GALLBLADDER SURGERY  2008  . HARDWARE REMOVAL Left 02/14/2016   Procedure: LEFT FOOT REMOVAL DEEP IMPLANT;  Surgeon: [name], MD;  Location: [facility];  Service: Orthopedics;  Laterality: Left;  . HERNIA REPAIR     Inguinal Hernia Repair  . SPINE SURGERY  2014   Past Medical History:  Diagnosis Date  . Allergy   . Anxiety   . Aspiration pneumonia ([place])   . Depression   . Frequent headaches    H/O  . GERD (gastroesophageal reflux disease)   . History of chicken pox   . History of colon polyps   . Hx of migraines   . Multiple sclerosis ([place])   . PONV (postoperative nausea and vomiting)    BP (!) 150/80   Pulse 87   Ht 5\' 3"  (1.6 m)   Wt 155 lb (70.3 kg)   BMI 27.46 kg/m   Opioid Risk Score:   Fall Risk Score:  `1  Depression screen PHQ 2/9  Depression screen [facility] 2/9 11/02/2020 07/12/2019 03/14/2019 03/10/2017 07/09/2016  Decreased Interest 0 0 0 1 0  Down, Depressed, Hopeless 0 0 0 1 0  PHQ - 2 Score 0 0 0 2 0  Altered sleeping - 0 0 0 -  Tired, decreased energy - 0 0 0 -  Change in appetite - 0 0 0 -  Feeling bad or failure about yourself  - 0 0 0 -  Trouble concentrating - 0 0 0 -  Moving slowly or fidgety/restless - 0 0 0 -  Suicidal thoughts - 0 0 0 -  PHQ-9 Score - 0 0 2 -  Difficult doing  work/chores - Not difficult at all Not difficult at all Very difficult -  Some recent data might be hidden    Review of Systems  Constitutional: Negative.   HENT: Negative.   Eyes: Negative.   Respiratory: Negative.   Cardiovascular: Negative.   Gastrointestinal: Positive for nausea.  Endocrine: Negative.   Genitourinary: Negative.   Skin: Negative.   Allergic/Immunologic: Negative.   Neurological: Negative.  Hematological: Negative.   Psychiatric/Behavioral: Negative.   All other systems reviewed and are negative.      Objective:   Physical Exam  Not performed as patient was seen via phone visit.       Assessment & Plan:  1) Anxiety: -discussed with patient the side effects of both Seroquel and Lorazepam, as well as potential interactions with other medications. -advised her to discuss with Bill the risks and benefits of both medications.  -discussed that we can follow-up tomorrow after their conversation to see what they decide -encouraged non-pharmacologic management approaches as well, such as meditation, applying lavender oil, and exercise- all of which she has been trying and which have been helping.  -discussed alternative anxiety medications.  -prescribed seroquel 12.5mg  daily and 25mg  at night -discontinue lorazepam.  -referred to [name] for neuropsych counseling  2) MS -continue home therapy to minimize stiffness/maximize mobility  10 minutes spent in discussion of risks and benefits of lorazepam vs. Seroquel, speaking with patient and husband, discussing conservative approaches, recommending and referring for neuropsych counseling.

## 2021-01-22 NOTE — Telephone Encounter (Signed)
Pt called returning a call  °

## 2021-01-22 NOTE — Telephone Encounter (Signed)
Please let her know this would be ok!

## 2021-01-22 NOTE — Telephone Encounter (Signed)
Caller informed. Task completed.

## 2021-01-22 NOTE — Telephone Encounter (Signed)
PT checked BP and it was 150/80. Advised we would discuss adjusting medication at appt tomorrow. She requested face to face. She has been advised to check in tomorrow around 11:40 so I have time to get her in a room before 12. Pt is also concerned that her lipitor is causing the stiffness/aches in her legs. She has stopped medication and notified the doctor who prescribed.

## 2021-01-23 ENCOUNTER — Ambulatory Visit (INDEPENDENT_AMBULATORY_CARE_PROVIDER_SITE_OTHER): Payer: Medicare Other | Admitting: Internal Medicine

## 2021-01-23 ENCOUNTER — Encounter: Payer: Self-pay | Admitting: Physical Medicine and Rehabilitation

## 2021-01-23 ENCOUNTER — Other Ambulatory Visit: Payer: Self-pay

## 2021-01-23 DIAGNOSIS — Z8673 Personal history of transient ischemic attack (TIA), and cerebral infarction without residual deficits: Secondary | ICD-10-CM | POA: Diagnosis not present

## 2021-01-23 DIAGNOSIS — E785 Hyperlipidemia, unspecified: Secondary | ICD-10-CM | POA: Diagnosis not present

## 2021-01-23 DIAGNOSIS — R21 Rash and other nonspecific skin eruption: Secondary | ICD-10-CM | POA: Diagnosis not present

## 2021-01-23 DIAGNOSIS — M21372 Foot drop, left foot: Secondary | ICD-10-CM | POA: Diagnosis not present

## 2021-01-23 DIAGNOSIS — F482 Pseudobulbar affect: Secondary | ICD-10-CM | POA: Diagnosis not present

## 2021-01-23 DIAGNOSIS — F339 Major depressive disorder, recurrent, unspecified: Secondary | ICD-10-CM | POA: Diagnosis not present

## 2021-01-23 DIAGNOSIS — G35 Multiple sclerosis: Secondary | ICD-10-CM | POA: Diagnosis not present

## 2021-01-23 DIAGNOSIS — E7800 Pure hypercholesterolemia, unspecified: Secondary | ICD-10-CM | POA: Diagnosis not present

## 2021-01-23 DIAGNOSIS — I639 Cerebral infarction, unspecified: Secondary | ICD-10-CM

## 2021-01-23 DIAGNOSIS — I69351 Hemiplegia and hemiparesis following cerebral infarction affecting right dominant side: Secondary | ICD-10-CM | POA: Diagnosis not present

## 2021-01-23 DIAGNOSIS — R739 Hyperglycemia, unspecified: Secondary | ICD-10-CM

## 2021-01-23 DIAGNOSIS — I10 Essential (primary) hypertension: Secondary | ICD-10-CM

## 2021-01-23 MED ORDER — NYSTATIN 100000 UNIT/GM EX CREA: 1.0000 "application " | TOPICAL_CREAM | Freq: Two times a day (BID) | CUTANEOUS | 0 refills | Status: DC

## 2021-01-23 NOTE — Telephone Encounter (Signed)
Had phone visit

## 2021-01-23 NOTE — Progress Notes (Signed)
Subjective:    Patient ID: Carly [name], female    DOB: [birth year], 70 y.o.   MRN: [id number]  HPI This visit occurred during the SARS-CoV-2 public health emergency.  Safety protocols were in place, including screening questions prior to the visit, additional usage of staff PPE, and extensive cleaning of exam room while observing appropriate contact time as indicated for disinfecting solutions.  Patient here for a scheduled follow up.  She is accompanied by her husband.  History obtained from both of them. Admitted 12/08/20 - MRI - actue white matter infact in deep white matter - left corona radiata.  Neurology consulted and felt that the stroke was due to small vessel disease and plavix was added.  Carotid duplex - no significant stenosis.  ECHO ER 60 - 65%.  Started on lipitor.  Admitted to inpatient rehab 12/13/20 - 12/27/20 - for PT, ST and OT.  Discharged with home health PT and OT.  Last visit, she was concerned about her elevated blood pressure.  Was started on amlodipine.  Amlodipine has since been increased to 84m q day.  Blood pressure dong better.  No chest pain or sob reported.  Eating.  No nausea or vomiting.  No abdominal pain.  Bowels moving.  She is back on seroquel.  Started back last night.  Feels better back on this medication. Still increased stress regarding her blood pressure.  She was having muscle aching - affecting her walking.  Stopped her lipitor last night.  Feels better today. Rash - under breasts.  Better with powder.     Past Medical History:  Diagnosis Date  . Allergy   . Anxiety   . [place]iration pneumonia ([place]   . Depression   . Frequent headaches    H/O  . GERD (gastroesophageal reflux disease)   . History of chicken pox   . History of colon polyps   . Hx of migraines   . Multiple sclerosis ([place]   . PONV (postoperative nausea and vomiting)    Past Surgical History:  Procedure Laterality Date  . BACK SURGERY    . CHOLECYSTECTOMY    . COLONOSCOPY WITH  PROPOFOL N/A 05/18/2017   Procedure: COLONOSCOPY WITH PROPOFOL;  Surgeon: EManya Silvas MD;  [facility];  Service: Endoscopy;  Laterality: N/A;  . FOOT SURGERY  2015  . GALLBLADDER SURGERY  2008  . HARDWARE REMOVAL Left 02/14/2016   Procedure: LEFT FOOT REMOVAL DEEP IMPLANT;  Surgeon: JWylene Simmer MD;  [place] [facility]  Service: Orthopedics;  Laterality: Left;  . HERNIA REPAIR     Inguinal Hernia Repair  . SPINE SURGERY  2014   Family History  Problem Relation Age of Onset  . Arthritis Mother   . Hypertension Mother   . Macular degeneration Mother   . Hypertension Father   . Hyperlipidemia Father   . Heart disease Maternal Grandfather   . Diabetes Maternal Grandfather   . Kidney disease Paternal Grandmother    Social History   Socioeconomic History  . Marital status: Married    Spouse name: Not on file  . Number of children: Not on file  . Years of education: Not on file  . Highest education level: Not on file  Occupational History  . Not on file  Tobacco Use  . Smoking status: Never Smoker  . Smokeless tobacco: Never Used  Vaping Use  . Vaping Use: Never used  Substance and Sexual Activity  . Alcohol use: No  Alcohol/week: 0.0 standard drinks  . Drug use: No  . Sexual activity: Not Currently  Other Topics Concern  . Not on file  Social History Narrative   Married    Social Determinants of Health   Financial Resource Strain: Low Risk   . Difficulty of Paying Living Expenses: Not hard at all  Food Insecurity: No Food Insecurity  . Worried About Charity fundraiser in the Last Year: Never true  . Ran Out of Food in the Last Year: Never true  Transportation Needs: No Transportation Needs  . Lack of Transportation (Medical): No  . Lack of Transportation (Non-Medical): No  Physical Activity: Not on file  Stress: No Stress Concern Present  . Feeling of Stress : Not at all  Social Connections: Unknown  . Frequency of  Communication with Friends and Family: Not on file  . Frequency of Social Gatherings with Friends and Family: Not on file  . Attends Religious Services: Not on file  . Active Member of Clubs or Organizations: Not on file  . Attends Archivist Meetings: Not on file  . Marital Status: Married    Outpatient Encounter Medications as of 01/23/2021  Medication Sig  . nystatin cream (MYCOSTATIN) Apply 1 application topically 2 (two) times daily.  Marland Kitchen acetaminophen (TYLENOL) 500 MG tablet Take 1 tablet (500 mg total) by mouth every 4 (four) hours as needed.  Marland Kitchen amLODipine (NORVASC) 5 MG tablet Take 1 tablet (5 mg total) by mouth daily.  Marland Kitchen atorvastatin (LIPITOR) 80 MG tablet TAKE 1 TABLET BY MOUTH AT BEDTIME.  Marland Kitchen Cholecalciferol (D3-1000 PO) Take 1,000 Units by mouth daily.  . clopidogrel (PLAVIX) 75 MG tablet TAKE 1 TABLET BY MOUTH DAILY.  Marland Kitchen Coenzyme Q10 (CO Q10) 100 MG CAPS Take 1 capsule by mouth daily.  Marland Kitchen dalfampridine 10 MG TB12 Take 10 mg by mouth every 12 (twelve) hours.  . docusate sodium (COLACE) 100 MG capsule Take 100 mg by mouth 2 (two) times daily.  . DULoxetine (CYMBALTA) 60 MG capsule Take 1 capsule (60 mg total) by mouth daily.  . famotidine (PEPCID) 20 MG tablet Take 1 tablet (20 mg total) by mouth 2 (two) times daily.  . fexofenadine (ALLEGRA ALLERGY) 180 MG tablet Take 1 tablet (180 mg total) by mouth daily.  . fluticasone (FLONASE) 50 MCG/ACT nasal spray Place 1 spray into both nostrils daily.  . furosemide (LASIX) 40 MG tablet Take 1 tablet (40 mg total) by mouth daily.  . magnesium oxide (MAG-OX) 400 MG tablet TAKE ONE TABLET EVERY DAY  . Magnesium Oxide 400 (240 Mg) MG TABS TAKE ONE TABLET EVERY DAY  . METAMUCIL FIBER PO Take by mouth.  . mirtazapine (REMERON) 45 MG tablet Take 1 tablet (45 mg total) by mouth at bedtime.  Marland Kitchen QUEtiapine (SEROQUEL) 25 MG tablet Take 1/2 pill in morning and a whole pill at bedtime.  . saccharomyces boulardii (FLORASTOR) 250 MG capsule Take  1 capsule (250 mg total) by mouth 2 (two) times daily.  . sodium chloride (OCEAN) 0.65 % nasal spray Place 1-2 sprays into the nose as needed.   Facility-Administered Encounter Medications as of 01/23/2021  Medication  . 0.9 %  sodium chloride infusion  . acetaminophen (TYLENOL) tablet 650 mg    Review of Systems  Constitutional: Negative for appetite change and unexpected weight change.  HENT: Negative for congestion and sinus pressure.   Respiratory: Negative for cough, chest tightness and shortness of breath.   Cardiovascular: Negative for chest pain,  palpitations and leg swelling.  Gastrointestinal: Negative for abdominal pain, diarrhea, nausea and vomiting.  Genitourinary: Negative for difficulty urinating and dysuria.  Musculoskeletal: Negative for joint swelling and myalgias.  Skin: Negative for color change and rash.  Neurological: Negative for dizziness, light-headedness and headaches.  Psychiatric/Behavioral: Negative for dysphoric mood.       Increased stress related to her blood pressure.  Concerned regarding elevation.        Objective:    Physical Exam Vitals reviewed.  Constitutional:      General: She is not in acute distress.    Appearance: Normal appearance.  HENT:     Head: Normocephalic and atraumatic.     Right Ear: External ear normal.     Left Ear: External ear normal.  Eyes:     General: No scleral icterus.       Right eye: No discharge.        Left eye: No discharge.     Conjunctiva/sclera: Conjunctivae normal.  Neck:     Thyroid: No thyromegaly.  Cardiovascular:     Rate and Rhythm: Normal rate and regular rhythm.  Pulmonary:     Effort: No respiratory distress.     Breath sounds: Normal breath sounds. No wheezing.  Abdominal:     General: Bowel sounds are normal.     Palpations: Abdomen is soft.     Tenderness: There is no abdominal tenderness.  Musculoskeletal:        General: No swelling or tenderness.     Cervical back: Neck supple. No  tenderness.  Lymphadenopathy:     Cervical: No cervical adenopathy.  Skin:    Findings: No erythema or rash.  Neurological:     Mental Status: She is alert.  Psychiatric:        Mood and Affect: Mood normal.        Behavior: Behavior normal.     BP 126/74   Pulse 90   Temp 97.8 F (36.6 C) (Oral)   Resp 16   Ht $R'5\' 3"'dj$  (1.6 m)   Wt 165 lb (74.8 kg)   SpO2 98%   BMI 29.23 kg/m  Wt Readings from Last 3 Encounters:  01/23/21 165 lb (74.8 kg)  01/22/21 155 lb (70.3 kg)  01/02/21 165 lb (74.8 kg)     Lab Results  Component Value Date   WBC 6.9 12/24/2020   HGB 12.9 12/24/2020   HCT 39.0 12/24/2020   PLT 297 12/24/2020   GLUCOSE 105 (H) 12/26/2020   CHOL 243 (H) 12/09/2020   TRIG 141 12/09/2020   HDL 72 12/09/2020   LDLDIRECT 127.0 08/17/2020   LDLCALC 143 (H) 12/09/2020   ALT 24 12/14/2020   AST 22 12/14/2020   NA 139 12/26/2020   K 4.2 12/26/2020   CL 105 12/26/2020   CREATININE 0.68 12/26/2020   BUN 11 12/26/2020   CO2 25 12/26/2020   TSH 2.522 12/09/2020   HGBA1C 5.7 (H) 12/09/2020       Assessment & Plan:   Problem List Items Addressed This Visit    Depression, recurrent ([place])    Followed by psychiatry.  Started back on seroquel last night.  Feels better.  Continue current medication regimen.       History of CVA (cerebrovascular accident)    Recently admitted as outlined.  Doing well post cva.  Continue exercise.  Follow. Continue risk factor modification.       Hypercholesterolemia    Was started on lipitor recently given CVA.  Had intolerance.  Discussed.  She stopped the medication yesterday. Will remain off temporarily.  Call with update.  May need trial of low dose crestor and advance up as tolerated.  Follow lipid panel.       Hyperglycemia    Low carb diet and exercise.  Follow met b and a1c.       Hypertension    Blood pressure improved.  Doing well on amlodipine.  Continue amlodipine at current dose.  Follow pressures.  Follow  metabolic panel.       Multiple sclerosis ([place])    Continue f/u with neurology.        Rash    Rash under breast.  Improved with powder.  Nystatin cream to have if needed.            Einar Pheasant, MD

## 2021-01-24 DIAGNOSIS — I10 Essential (primary) hypertension: Secondary | ICD-10-CM | POA: Diagnosis not present

## 2021-01-24 DIAGNOSIS — F482 Pseudobulbar affect: Secondary | ICD-10-CM | POA: Diagnosis not present

## 2021-01-24 DIAGNOSIS — M21372 Foot drop, left foot: Secondary | ICD-10-CM | POA: Diagnosis not present

## 2021-01-24 DIAGNOSIS — I69351 Hemiplegia and hemiparesis following cerebral infarction affecting right dominant side: Secondary | ICD-10-CM | POA: Diagnosis not present

## 2021-01-24 DIAGNOSIS — E785 Hyperlipidemia, unspecified: Secondary | ICD-10-CM | POA: Diagnosis not present

## 2021-01-24 DIAGNOSIS — G35 Multiple sclerosis: Secondary | ICD-10-CM | POA: Diagnosis not present

## 2021-01-25 DIAGNOSIS — I10 Essential (primary) hypertension: Secondary | ICD-10-CM | POA: Diagnosis not present

## 2021-01-25 DIAGNOSIS — M21372 Foot drop, left foot: Secondary | ICD-10-CM | POA: Diagnosis not present

## 2021-01-25 DIAGNOSIS — E785 Hyperlipidemia, unspecified: Secondary | ICD-10-CM | POA: Diagnosis not present

## 2021-01-25 DIAGNOSIS — F482 Pseudobulbar affect: Secondary | ICD-10-CM | POA: Diagnosis not present

## 2021-01-25 DIAGNOSIS — G35 Multiple sclerosis: Secondary | ICD-10-CM | POA: Diagnosis not present

## 2021-01-25 DIAGNOSIS — I69351 Hemiplegia and hemiparesis following cerebral infarction affecting right dominant side: Secondary | ICD-10-CM | POA: Diagnosis not present

## 2021-01-26 ENCOUNTER — Encounter: Payer: Self-pay | Admitting: Internal Medicine

## 2021-01-27 ENCOUNTER — Encounter: Payer: Self-pay | Admitting: Internal Medicine

## 2021-01-27 DIAGNOSIS — F482 Pseudobulbar affect: Secondary | ICD-10-CM | POA: Diagnosis not present

## 2021-01-27 DIAGNOSIS — Z9049 Acquired absence of other specified parts of digestive tract: Secondary | ICD-10-CM | POA: Diagnosis not present

## 2021-01-27 DIAGNOSIS — M21372 Foot drop, left foot: Secondary | ICD-10-CM | POA: Diagnosis not present

## 2021-01-27 DIAGNOSIS — N83202 Unspecified ovarian cyst, left side: Secondary | ICD-10-CM | POA: Diagnosis not present

## 2021-01-27 DIAGNOSIS — G43909 Migraine, unspecified, not intractable, without status migrainosus: Secondary | ICD-10-CM | POA: Diagnosis not present

## 2021-01-27 DIAGNOSIS — K219 Gastro-esophageal reflux disease without esophagitis: Secondary | ICD-10-CM | POA: Diagnosis not present

## 2021-01-27 DIAGNOSIS — Z86010 Personal history of colonic polyps: Secondary | ICD-10-CM | POA: Diagnosis not present

## 2021-01-27 DIAGNOSIS — M5136 Other intervertebral disc degeneration, lumbar region: Secondary | ICD-10-CM | POA: Diagnosis not present

## 2021-01-27 DIAGNOSIS — I10 Essential (primary) hypertension: Secondary | ICD-10-CM | POA: Diagnosis not present

## 2021-01-27 DIAGNOSIS — F32A Depression, unspecified: Secondary | ICD-10-CM | POA: Diagnosis not present

## 2021-01-27 DIAGNOSIS — E785 Hyperlipidemia, unspecified: Secondary | ICD-10-CM | POA: Diagnosis not present

## 2021-01-27 DIAGNOSIS — E7800 Pure hypercholesterolemia, unspecified: Secondary | ICD-10-CM | POA: Diagnosis not present

## 2021-01-27 DIAGNOSIS — R21 Rash and other nonspecific skin eruption: Secondary | ICD-10-CM | POA: Insufficient documentation

## 2021-01-27 DIAGNOSIS — M899 Disorder of bone, unspecified: Secondary | ICD-10-CM | POA: Diagnosis not present

## 2021-01-27 DIAGNOSIS — N83201 Unspecified ovarian cyst, right side: Secondary | ICD-10-CM | POA: Diagnosis not present

## 2021-01-27 DIAGNOSIS — F411 Generalized anxiety disorder: Secondary | ICD-10-CM | POA: Diagnosis not present

## 2021-01-27 DIAGNOSIS — Z7902 Long term (current) use of antithrombotics/antiplatelets: Secondary | ICD-10-CM | POA: Diagnosis not present

## 2021-01-27 DIAGNOSIS — Z79899 Other long term (current) drug therapy: Secondary | ICD-10-CM | POA: Diagnosis not present

## 2021-01-27 DIAGNOSIS — G35 Multiple sclerosis: Secondary | ICD-10-CM | POA: Diagnosis not present

## 2021-01-27 DIAGNOSIS — Z8701 Personal history of pneumonia (recurrent): Secondary | ICD-10-CM | POA: Diagnosis not present

## 2021-01-27 DIAGNOSIS — Z981 Arthrodesis status: Secondary | ICD-10-CM | POA: Diagnosis not present

## 2021-01-27 DIAGNOSIS — E559 Vitamin D deficiency, unspecified: Secondary | ICD-10-CM | POA: Diagnosis not present

## 2021-01-27 DIAGNOSIS — I69351 Hemiplegia and hemiparesis following cerebral infarction affecting right dominant side: Secondary | ICD-10-CM | POA: Diagnosis not present

## 2021-01-27 DIAGNOSIS — Z8619 Personal history of other infectious and parasitic diseases: Secondary | ICD-10-CM | POA: Diagnosis not present

## 2021-01-27 DIAGNOSIS — J302 Other seasonal allergic rhinitis: Secondary | ICD-10-CM | POA: Diagnosis not present

## 2021-01-27 DIAGNOSIS — M949 Disorder of cartilage, unspecified: Secondary | ICD-10-CM | POA: Diagnosis not present

## 2021-01-27 NOTE — Assessment & Plan Note (Signed)
Rash under breast.  Improved with powder.  Nystatin cream to have if needed.

## 2021-01-27 NOTE — Assessment & Plan Note (Signed)
Blood pressure improved.  Doing well on amlodipine.  Continue amlodipine at current dose.  Follow pressures.  Follow metabolic panel.

## 2021-01-27 NOTE — Assessment & Plan Note (Signed)
Was started on lipitor recently given CVA.  Had intolerance.  Discussed.  She stopped the medication yesterday. Will remain off temporarily.  Call with update.  May need trial of low dose crestor and advance up as tolerated.  Follow lipid panel.

## 2021-01-27 NOTE — Assessment & Plan Note (Signed)
Followed by psychiatry.  Started back on seroquel last night.  Feels better.  Continue current medication regimen.

## 2021-01-27 NOTE — Assessment & Plan Note (Signed)
Continue f/u with neurology. 

## 2021-01-27 NOTE — Assessment & Plan Note (Signed)
Low carb diet and exercise.  Follow met b and a1c.  

## 2021-01-27 NOTE — Assessment & Plan Note (Signed)
Recently admitted as outlined.  Doing well post cva.  Continue exercise.  Follow. Continue risk factor modification.

## 2021-01-28 DIAGNOSIS — F482 Pseudobulbar affect: Secondary | ICD-10-CM | POA: Diagnosis not present

## 2021-01-28 DIAGNOSIS — I69351 Hemiplegia and hemiparesis following cerebral infarction affecting right dominant side: Secondary | ICD-10-CM | POA: Diagnosis not present

## 2021-01-28 DIAGNOSIS — E785 Hyperlipidemia, unspecified: Secondary | ICD-10-CM | POA: Diagnosis not present

## 2021-01-28 DIAGNOSIS — I10 Essential (primary) hypertension: Secondary | ICD-10-CM | POA: Diagnosis not present

## 2021-01-28 DIAGNOSIS — G35 Multiple sclerosis: Secondary | ICD-10-CM | POA: Diagnosis not present

## 2021-01-28 DIAGNOSIS — M21372 Foot drop, left foot: Secondary | ICD-10-CM | POA: Diagnosis not present

## 2021-01-29 ENCOUNTER — Telehealth: Payer: Self-pay

## 2021-01-29 ENCOUNTER — Telehealth: Payer: Self-pay | Admitting: *Deleted

## 2021-01-29 NOTE — Telephone Encounter (Signed)
Pt called to discuss  furosemide (LASIX) 40 MG tablet

## 2021-01-29 NOTE — Telephone Encounter (Signed)
Sherlynn Stalls, OT, [facility] left a message asking for verbal orders for HHOT extension 2wk4. Medical record reviewed. Social work note reviewed.  Verbal orders given per office protocol.

## 2021-01-30 DIAGNOSIS — I10 Essential (primary) hypertension: Secondary | ICD-10-CM | POA: Diagnosis not present

## 2021-01-30 DIAGNOSIS — G35 Multiple sclerosis: Secondary | ICD-10-CM | POA: Diagnosis not present

## 2021-01-30 DIAGNOSIS — F482 Pseudobulbar affect: Secondary | ICD-10-CM | POA: Diagnosis not present

## 2021-01-30 DIAGNOSIS — E785 Hyperlipidemia, unspecified: Secondary | ICD-10-CM | POA: Diagnosis not present

## 2021-01-30 DIAGNOSIS — M21372 Foot drop, left foot: Secondary | ICD-10-CM | POA: Diagnosis not present

## 2021-01-30 DIAGNOSIS — I69351 Hemiplegia and hemiparesis following cerebral infarction affecting right dominant side: Secondary | ICD-10-CM | POA: Diagnosis not present

## 2021-01-30 NOTE — Telephone Encounter (Signed)
LMTCB

## 2021-01-31 ENCOUNTER — Other Ambulatory Visit: Payer: Self-pay

## 2021-01-31 ENCOUNTER — Telehealth: Payer: Self-pay | Admitting: *Deleted

## 2021-01-31 ENCOUNTER — Encounter (HOSPITAL_BASED_OUTPATIENT_CLINIC_OR_DEPARTMENT_OTHER): Payer: Medicare Other | Admitting: Physical Medicine and Rehabilitation

## 2021-01-31 DIAGNOSIS — I10 Essential (primary) hypertension: Secondary | ICD-10-CM

## 2021-01-31 DIAGNOSIS — M7918 Myalgia, other site: Secondary | ICD-10-CM | POA: Diagnosis not present

## 2021-01-31 DIAGNOSIS — G35 Multiple sclerosis: Secondary | ICD-10-CM | POA: Diagnosis not present

## 2021-01-31 DIAGNOSIS — F411 Generalized anxiety disorder: Secondary | ICD-10-CM | POA: Diagnosis not present

## 2021-01-31 DIAGNOSIS — M9901 Segmental and somatic dysfunction of cervical region: Secondary | ICD-10-CM | POA: Diagnosis not present

## 2021-01-31 DIAGNOSIS — M9900 Segmental and somatic dysfunction of head region: Secondary | ICD-10-CM | POA: Diagnosis not present

## 2021-01-31 DIAGNOSIS — E785 Hyperlipidemia, unspecified: Secondary | ICD-10-CM | POA: Diagnosis not present

## 2021-01-31 DIAGNOSIS — M5413 Radiculopathy, cervicothoracic region: Secondary | ICD-10-CM | POA: Diagnosis not present

## 2021-01-31 DIAGNOSIS — R519 Headache, unspecified: Secondary | ICD-10-CM | POA: Diagnosis not present

## 2021-01-31 DIAGNOSIS — M542 Cervicalgia: Secondary | ICD-10-CM | POA: Diagnosis not present

## 2021-01-31 DIAGNOSIS — M5412 Radiculopathy, cervical region: Secondary | ICD-10-CM | POA: Diagnosis not present

## 2021-01-31 DIAGNOSIS — M21372 Foot drop, left foot: Secondary | ICD-10-CM | POA: Diagnosis not present

## 2021-01-31 DIAGNOSIS — I69351 Hemiplegia and hemiparesis following cerebral infarction affecting right dominant side: Secondary | ICD-10-CM | POA: Diagnosis not present

## 2021-01-31 DIAGNOSIS — F482 Pseudobulbar affect: Secondary | ICD-10-CM | POA: Diagnosis not present

## 2021-01-31 NOTE — Telephone Encounter (Signed)
Messaged April to ask her to set up phone call visit at 11:40

## 2021-01-31 NOTE — Progress Notes (Signed)
Subjective:    Patient ID: Carly [name], female    DOB: [birth year], 70 y.o.   MRN: [id number]  HPI  Due to national recommendations of social distancing because of COVID 22, an audio/video tele-health visit is felt to be the most appropriate encounter for this patient at this time. See MyChart message from today for the patient's consent to a tele-health encounter with [facility]. This is a follow up tele-visit via Webex. The patient is at home. MD is at office.   Carly [name] is a 70 year old woman who was admitted to CIR with white matter periventricular CVA, presents for follow-up regarding anxiety  1) Anxiety:  -her psychiatrist transitioned her from Seroquel back to benzodiazepine due to side effect profile of Seroquel. -since she stopped the Seroquel, her anxiety has increased, and she doesn't know what to do. -she and husband [name] were scared of side effects of Seroquel that were discussed  -she has been sleeping but her legs feel very stiff at night -she continues to take Cymbalta 60mg  and [name] 45mg  as well. -she discussed with her husband, who is also present on this call today, and she would prefer to restart Seroquel and stop Lorazepam.  -she has not had any counseling and would like to restart this. She has an appointment with [name] in August and would like to start counseling earlier than this if possible.  -she loved speaking with [name] inpatient and would like to follow with him -she had symptoms of dizziness yesterday at the hairdresser and was asked to contact me regarding if this could be from the seroquel. She had had no other similar episodes since starting it.   2) MS: -has been having a lot of stiffness recently -EMS called twice due to difficulty getting her up off the floor -is receiving home therapy    Pain Inventory Average Pain 5 Pain Right Now 5 My pain is constant  In the last 24  hours, has pain interfered with the following? General activity n/a Relation with others n/a Enjoyment of life n/a What TIME of day is your pain at its worst? varies Sleep (in general) NA  Pain is worse with: unsure Pain improves with: n/a Relief from Meds: n/a  Family History  Problem Relation Age of Onset  . Arthritis Mother   . Hypertension Mother   . Macular degeneration Mother   . Hypertension Father   . Hyperlipidemia Father   . Heart disease Maternal Grandfather   . Diabetes Maternal Grandfather   . Kidney disease Paternal Grandmother    Social History   Socioeconomic History  . Marital status: Married    Spouse name: Not on file  . Number of children: Not on file  . Years of education: Not on file  . Highest education level: Not on file  Occupational History  . Not on file  Tobacco Use  . Smoking status: Never Smoker  . Smokeless tobacco: Never Used  Vaping Use  . Vaping Use: Never used  Substance and Sexual Activity  . Alcohol use: No    Alcohol/week: 0.0 standard drinks  . Drug use: No  . Sexual activity: Not Currently  Other Topics Concern  . Not on file  Social History Narrative   Married    Social Determinants of Health   Financial Resource Strain: Low Risk   . Difficulty of Paying Living Expenses: Not hard at all  Food Insecurity: No Food Insecurity  .  Worried About Charity fundraiser in the Last Year: Never true  . Ran Out of Food in the Last Year: Never true  Transportation Needs: No Transportation Needs  . Lack of Transportation (Medical): No  . Lack of Transportation (Non-Medical): No  Physical Activity: Not on file  Stress: No Stress Concern Present  . Feeling of Stress : Not at all  Social Connections: Unknown  . Frequency of Communication with Friends and Family: Not on file  . Frequency of Social Gatherings with Friends and Family: Not on file  . Attends Religious Services: Not on file  . Active Member of Clubs or Organizations:  Not on file  . Attends Archivist Meetings: Not on file  . Marital Status: Married   Past Surgical History:  Procedure Laterality Date  . BACK SURGERY    . CHOLECYSTECTOMY    . COLONOSCOPY WITH PROPOFOL N/A 05/18/2017   Procedure: COLONOSCOPY WITH PROPOFOL;  Surgeon: [name], MD;  [facility];  Service: Endoscopy;  Laterality: N/A;  . FOOT SURGERY  2015  . GALLBLADDER SURGERY  2008  . HARDWARE REMOVAL Left 02/14/2016   Procedure: LEFT FOOT REMOVAL DEEP IMPLANT;  Surgeon: [name], MD;  Location: [facility];  Service: Orthopedics;  Laterality: Left;  . HERNIA REPAIR     Inguinal Hernia Repair  . SPINE SURGERY  2014   Past Surgical History:  Procedure Laterality Date  . BACK SURGERY    . CHOLECYSTECTOMY    . COLONOSCOPY WITH PROPOFOL N/A 05/18/2017   Procedure: COLONOSCOPY WITH PROPOFOL;  Surgeon: [name], MD;  Location: [facility] ENDOSCOPY;  Service: Endoscopy;  Laterality: N/A;  . FOOT SURGERY  2015  . GALLBLADDER SURGERY  2008  . HARDWARE REMOVAL Left 02/14/2016   Procedure: LEFT FOOT REMOVAL DEEP IMPLANT;  Surgeon: [name], MD;  Location: [place];  Service: Orthopedics;  Laterality: Left;  . HERNIA REPAIR     Inguinal Hernia Repair  . SPINE SURGERY  2014   Past Medical History:  Diagnosis Date  . Allergy   . Anxiety   . Aspiration pneumonia ([place])   . Depression   . Frequent headaches    H/O  . GERD (gastroesophageal reflux disease)   . History of chicken pox   . History of colon polyps   . Hx of migraines   . Multiple sclerosis ([place])   . PONV (postoperative nausea and vomiting)    There were no vitals taken for this visit.  Opioid Risk Score:   Fall Risk Score:  `1  Depression screen PHQ 2/9  Depression screen [facility] 2/9 11/02/2020 07/12/2019 03/14/2019 03/10/2017 07/09/2016  Decreased Interest 0 0 0 1 0  Down, Depressed, Hopeless 0 0 0 1 0  PHQ - 2 Score 0 0 0 2 0  Altered sleeping - 0 0 0 -   Tired, decreased energy - 0 0 0 -  Change in appetite - 0 0 0 -  Feeling bad or failure about yourself  - 0 0 0 -  Trouble concentrating - 0 0 0 -  Moving slowly or fidgety/restless - 0 0 0 -  Suicidal thoughts - 0 0 0 -  PHQ-9 Score - 0 0 2 -  Difficult doing work/chores - Not difficult at all Not difficult at all Very difficult -  Some recent data might be hidden    Review of Systems  Constitutional: Negative.   HENT: Negative.   Eyes: Negative.  Respiratory: Negative.   Cardiovascular: Negative.   Gastrointestinal: Positive for nausea.  Endocrine: Negative.   Genitourinary: Negative.   Skin: Negative.   Allergic/Immunologic: Negative.   Neurological: Negative.   Hematological: Negative.   Psychiatric/Behavioral: Negative.   All other systems reviewed and are negative.      Objective:   Physical Exam  Not performed as patient was seen via phone visit.       Assessment & Plan:  1) Anxiety: -discussed with patient the side effects of both Seroquel and Lorazepam, as well as potential interactions with other medications. -advised her to discuss with Bill the risks and benefits of both medications.  -discussed that we can follow-up tomorrow after their conversation to see what they decide -continue to encourage non-pharmacologic management approaches as well, such as meditation, applying lavender oil, and exercise- all of which she has been trying and which have been helping.  -discussed alternative anxiety medications.  -prescribed seroquel 12.5mg  daily and 25mg  at night. Can decreased to 25mg  at night and assess positive/negative effects during the day. Can restart 1/2 tab during the day if anxiety is uncontrolled.  -discontinue lorazepam.  -referred to [name] for neuropsych counseling  2) MS -continue home therapy to minimize stiffness/maximize mobility  3) HTN: BP has been 130s-150s/80s, recommended citrus foods and nuts, as much mobility as she can  tolerate, log Bps daily and bring log to f/u appointment. If remains >500X systolic at this visit, can consider increasing Norvasc to 10mg .   22 minutes spent in discussing dizziness and confusion, decreasing seroquel dose during the day, encouraging meditation, doing the things she enjoys, applying lavender oil, exercising, reviewing BP meds, asking her to keep BP log and bring into follow-up appointment, encouraging high potassium foods

## 2021-01-31 NOTE — Telephone Encounter (Signed)
Mrs Hintze and Mr Rascon have called this morning about Carly [name] having a severe reaction to the Seroquel she was put on. She has had a severe headache for last 4 days and yesterday she was so confused and dizzy she could not transfer from a chair.  Mrs Bourque called first wanting to talk to Dr Ranell Patrick about the referral and then Mr Kasparek called about the symptoms after speaking with the pharmacisit,  Please call. The number is [phone], and his mobile number is [phone].

## 2021-02-04 DIAGNOSIS — I69351 Hemiplegia and hemiparesis following cerebral infarction affecting right dominant side: Secondary | ICD-10-CM | POA: Diagnosis not present

## 2021-02-04 DIAGNOSIS — I10 Essential (primary) hypertension: Secondary | ICD-10-CM | POA: Diagnosis not present

## 2021-02-04 DIAGNOSIS — F482 Pseudobulbar affect: Secondary | ICD-10-CM | POA: Diagnosis not present

## 2021-02-04 DIAGNOSIS — E785 Hyperlipidemia, unspecified: Secondary | ICD-10-CM | POA: Diagnosis not present

## 2021-02-04 DIAGNOSIS — G35 Multiple sclerosis: Secondary | ICD-10-CM | POA: Diagnosis not present

## 2021-02-04 DIAGNOSIS — M21372 Foot drop, left foot: Secondary | ICD-10-CM | POA: Diagnosis not present

## 2021-02-05 ENCOUNTER — Telehealth: Payer: Self-pay | Admitting: Internal Medicine

## 2021-02-05 DIAGNOSIS — I10 Essential (primary) hypertension: Secondary | ICD-10-CM | POA: Diagnosis not present

## 2021-02-05 DIAGNOSIS — F482 Pseudobulbar affect: Secondary | ICD-10-CM | POA: Diagnosis not present

## 2021-02-05 DIAGNOSIS — M21372 Foot drop, left foot: Secondary | ICD-10-CM | POA: Diagnosis not present

## 2021-02-05 DIAGNOSIS — G35 Multiple sclerosis: Secondary | ICD-10-CM | POA: Diagnosis not present

## 2021-02-05 DIAGNOSIS — E785 Hyperlipidemia, unspecified: Secondary | ICD-10-CM | POA: Diagnosis not present

## 2021-02-05 DIAGNOSIS — I69351 Hemiplegia and hemiparesis following cerebral infarction affecting right dominant side: Secondary | ICD-10-CM | POA: Diagnosis not present

## 2021-02-05 NOTE — Telephone Encounter (Signed)
Patient's BP readings   01/27/21-  135/89  01/28/21- 131/93 ,140/87, 130/86 01/30/21-  120/80   01/31/21-  140/88 02/02/21-   Misssed 02/03/21-  139/95, 141/88 02/04/21-   141/93 ,138/88

## 2021-02-06 DIAGNOSIS — I69351 Hemiplegia and hemiparesis following cerebral infarction affecting right dominant side: Secondary | ICD-10-CM | POA: Diagnosis not present

## 2021-02-06 DIAGNOSIS — F482 Pseudobulbar affect: Secondary | ICD-10-CM | POA: Diagnosis not present

## 2021-02-06 DIAGNOSIS — M21372 Foot drop, left foot: Secondary | ICD-10-CM | POA: Diagnosis not present

## 2021-02-06 DIAGNOSIS — I10 Essential (primary) hypertension: Secondary | ICD-10-CM | POA: Diagnosis not present

## 2021-02-06 DIAGNOSIS — G35 Multiple sclerosis: Secondary | ICD-10-CM | POA: Diagnosis not present

## 2021-02-06 DIAGNOSIS — E785 Hyperlipidemia, unspecified: Secondary | ICD-10-CM | POA: Diagnosis not present

## 2021-02-06 NOTE — Telephone Encounter (Signed)
Patient called in with BP readings. 

## 2021-02-06 NOTE — Telephone Encounter (Signed)
Blood pressure in office was good.  Her checks are a little higher than goal.  Have her continue to spot check - get more readings.  Also, see if agreeable to schedule a f/u appt with me to f/u regarding her blood pressure.  Have them bring their cuff.

## 2021-02-08 DIAGNOSIS — I10 Essential (primary) hypertension: Secondary | ICD-10-CM | POA: Diagnosis not present

## 2021-02-08 DIAGNOSIS — G35 Multiple sclerosis: Secondary | ICD-10-CM | POA: Diagnosis not present

## 2021-02-08 DIAGNOSIS — M21372 Foot drop, left foot: Secondary | ICD-10-CM | POA: Diagnosis not present

## 2021-02-08 DIAGNOSIS — I69351 Hemiplegia and hemiparesis following cerebral infarction affecting right dominant side: Secondary | ICD-10-CM | POA: Diagnosis not present

## 2021-02-08 DIAGNOSIS — F482 Pseudobulbar affect: Secondary | ICD-10-CM | POA: Diagnosis not present

## 2021-02-08 DIAGNOSIS — E785 Hyperlipidemia, unspecified: Secondary | ICD-10-CM | POA: Diagnosis not present

## 2021-02-08 NOTE — Telephone Encounter (Signed)
Pt scheduled with Dr Scott 

## 2021-02-09 ENCOUNTER — Other Ambulatory Visit: Payer: Self-pay | Admitting: Internal Medicine

## 2021-02-12 DIAGNOSIS — I69351 Hemiplegia and hemiparesis following cerebral infarction affecting right dominant side: Secondary | ICD-10-CM | POA: Diagnosis not present

## 2021-02-12 DIAGNOSIS — I10 Essential (primary) hypertension: Secondary | ICD-10-CM | POA: Diagnosis not present

## 2021-02-12 DIAGNOSIS — F482 Pseudobulbar affect: Secondary | ICD-10-CM | POA: Diagnosis not present

## 2021-02-12 DIAGNOSIS — M21372 Foot drop, left foot: Secondary | ICD-10-CM | POA: Diagnosis not present

## 2021-02-12 DIAGNOSIS — G35 Multiple sclerosis: Secondary | ICD-10-CM | POA: Diagnosis not present

## 2021-02-12 DIAGNOSIS — E785 Hyperlipidemia, unspecified: Secondary | ICD-10-CM | POA: Diagnosis not present

## 2021-02-13 DIAGNOSIS — M21372 Foot drop, left foot: Secondary | ICD-10-CM | POA: Diagnosis not present

## 2021-02-13 DIAGNOSIS — G35 Multiple sclerosis: Secondary | ICD-10-CM | POA: Diagnosis not present

## 2021-02-13 DIAGNOSIS — F482 Pseudobulbar affect: Secondary | ICD-10-CM | POA: Diagnosis not present

## 2021-02-13 DIAGNOSIS — E785 Hyperlipidemia, unspecified: Secondary | ICD-10-CM | POA: Diagnosis not present

## 2021-02-13 DIAGNOSIS — I69351 Hemiplegia and hemiparesis following cerebral infarction affecting right dominant side: Secondary | ICD-10-CM | POA: Diagnosis not present

## 2021-02-13 DIAGNOSIS — I10 Essential (primary) hypertension: Secondary | ICD-10-CM | POA: Diagnosis not present

## 2021-02-14 ENCOUNTER — Telehealth: Payer: Self-pay

## 2021-02-14 ENCOUNTER — Encounter: Payer: Self-pay | Admitting: Internal Medicine

## 2021-02-14 ENCOUNTER — Other Ambulatory Visit: Payer: Self-pay

## 2021-02-14 ENCOUNTER — Ambulatory Visit (INDEPENDENT_AMBULATORY_CARE_PROVIDER_SITE_OTHER): Payer: Medicare Other | Admitting: Internal Medicine

## 2021-02-14 DIAGNOSIS — F339 Major depressive disorder, recurrent, unspecified: Secondary | ICD-10-CM | POA: Diagnosis not present

## 2021-02-14 DIAGNOSIS — M21372 Foot drop, left foot: Secondary | ICD-10-CM | POA: Diagnosis not present

## 2021-02-14 DIAGNOSIS — E7800 Pure hypercholesterolemia, unspecified: Secondary | ICD-10-CM

## 2021-02-14 DIAGNOSIS — I10 Essential (primary) hypertension: Secondary | ICD-10-CM

## 2021-02-14 DIAGNOSIS — R600 Localized edema: Secondary | ICD-10-CM | POA: Diagnosis not present

## 2021-02-14 DIAGNOSIS — F482 Pseudobulbar affect: Secondary | ICD-10-CM | POA: Diagnosis not present

## 2021-02-14 DIAGNOSIS — G35 Multiple sclerosis: Secondary | ICD-10-CM | POA: Diagnosis not present

## 2021-02-14 DIAGNOSIS — I69351 Hemiplegia and hemiparesis following cerebral infarction affecting right dominant side: Secondary | ICD-10-CM | POA: Diagnosis not present

## 2021-02-14 DIAGNOSIS — I639 Cerebral infarction, unspecified: Secondary | ICD-10-CM | POA: Diagnosis not present

## 2021-02-14 DIAGNOSIS — Z8673 Personal history of transient ischemic attack (TIA), and cerebral infarction without residual deficits: Secondary | ICD-10-CM

## 2021-02-14 DIAGNOSIS — R739 Hyperglycemia, unspecified: Secondary | ICD-10-CM

## 2021-02-14 DIAGNOSIS — E785 Hyperlipidemia, unspecified: Secondary | ICD-10-CM | POA: Diagnosis not present

## 2021-02-14 MED ORDER — ROSUVASTATIN CALCIUM 5 MG PO TABS: ORAL_TABLET | ORAL | 1 refills | Status: DC

## 2021-02-14 MED ORDER — LOSARTAN POTASSIUM 25 MG PO TABS: 25.0000 mg | ORAL_TABLET | Freq: Every day | ORAL | 2 refills | Status: DC

## 2021-02-14 NOTE — Telephone Encounter (Signed)
Tiffany with [facility] called for an verbal  back order. Lalisa  [place] received in-home physical therapy in April 2022. Services where given once a week for one week, twice a week for four weeks, then once a week for three weeks.  - Call back phone [phone].

## 2021-02-14 NOTE — Progress Notes (Signed)
Patient ID: Carly [name], female   DOB: 01-06-1951, 70 y.o.   MRN: [id number]   Subjective:    Patient ID: Carly [name], female    DOB: 08-10-1951, 70 y.o.   MRN: [id number]  HPI This visit occurred during the SARS-CoV-2 public health emergency.  Safety protocols were in place, including screening questions prior to the visit, additional usage of staff PPE, and extensive cleaning of exam room while observing appropriate contact time as indicated for disinfecting solutions.  Patient here for a scheduled follow up.  She is accompanied by her husband. History obtained from both of them.  Here to follow up regarding her blood pressure and to discuss cholesterol treatment.  Recent CVA.  Increased stress related to this. Seeing psychiatry.  On remeron.  Has been off cholesterol medication.  Concern regarding intolerance.  Discussed given recent CVA - starting crestor. Also increased stress related to her blood pressure.  She is concerned regarding elevation.  On amlodipine 43m q day.  Discussed other treatment options.  Has had issues with pedal edema.  Discussed holding on increasing amlodipine.  Discussed leg elevation.  Discussed taking lasix prn.  Blood pressure overall better.  No chest pain.  Breathing stable.  Bowels doing better.   Past Medical History:  Diagnosis Date  . Allergy   . Anxiety   . [place] pneumonia [place]   . Depression   . Frequent headaches    H/O  . GERD (gastroesophageal reflux disease)   . History of chicken pox   . History of colon polyps   . Hx of migraines   . Multiple sclerosis ([place]   . PONV (postoperative nausea and vomiting)    Past Surgical History:  Procedure Laterality Date  . BACK SURGERY    . CHOLECYSTECTOMY    . COLONOSCOPY WITH PROPOFOL N/A 05/18/2017   Procedure: COLONOSCOPY WITH PROPOFOL;  Surgeon: EManya Silvas MD;  [facility];  Service: Endoscopy;  Laterality: N/A;  . FOOT SURGERY  2015  . GALLBLADDER SURGERY  2008   . HARDWARE REMOVAL Left 02/14/2016   Procedure: LEFT FOOT REMOVAL DEEP IMPLANT;  Surgeon: JWylene Simmer MD;  [facility] [facility]  Service: Orthopedics;  Laterality: Left;  . HERNIA REPAIR     Inguinal Hernia Repair  . SPINE SURGERY  2014   Family History  Problem Relation Age of Onset  . Arthritis Mother   . Hypertension Mother   . Macular degeneration Mother   . Hypertension Father   . Hyperlipidemia Father   . Heart disease Maternal Grandfather   . Diabetes Maternal Grandfather   . Kidney disease Paternal Grandmother    Social History   Socioeconomic History  . Marital status: Married    Spouse name: Not on file  . Number of children: Not on file  . Years of education: Not on file  . Highest education level: Not on file  Occupational History  . Not on file  Tobacco Use  . Smoking status: Never Smoker  . Smokeless tobacco: Never Used  Vaping Use  . Vaping Use: Never used  Substance and Sexual Activity  . Alcohol use: No    Alcohol/week: 0.0 standard drinks  . Drug use: No  . Sexual activity: Not Currently  Other Topics Concern  . Not on file  Social History Narrative   Married    Social Determinants of Health   Financial Resource Strain: Low Risk   . Difficulty of Paying Living Expenses: Not hard  at all  Food Insecurity: No Food Insecurity  . Worried About Charity fundraiser in the Last Year: Never true  . Ran Out of Food in the Last Year: Never true  Transportation Needs: No Transportation Needs  . Lack of Transportation (Medical): No  . Lack of Transportation (Non-Medical): No  Physical Activity: Not on file  Stress: No Stress Concern Present  . Feeling of Stress : Not at all  Social Connections: Unknown  . Frequency of Communication with Friends and Family: Not on file  . Frequency of Social Gatherings with Friends and Family: Not on file  . Attends Religious Services: Not on file  . Active Member of Clubs or Organizations: Not on file   . Attends Archivist Meetings: Not on file  . Marital Status: Married    Outpatient Encounter Medications as of 02/14/2021  Medication Sig  . acetaminophen (TYLENOL) 500 MG tablet Take 1 tablet (500 mg total) by mouth every 4 (four) hours as needed.  Marland Kitchen amLODipine (NORVASC) 5 MG tablet Take 1 tablet (5 mg total) by mouth daily.  . Cholecalciferol (D3-1000 PO) Take 1,000 Units by mouth daily.  . clopidogrel (PLAVIX) 75 MG tablet TAKE 1 TABLET BY MOUTH DAILY.  Marland Kitchen Coenzyme Q10 (CO Q10) 100 MG CAPS Take 1 capsule by mouth daily.  Marland Kitchen dalfampridine 10 MG TB12 Take 10 mg by mouth every 12 (twelve) hours.  . docusate sodium (COLACE) 100 MG capsule Take 100 mg by mouth 2 (two) times daily.  . DULoxetine (CYMBALTA) 60 MG capsule Take 1 capsule (60 mg total) by mouth daily.  . famotidine (PEPCID) 20 MG tablet Take 1 tablet (20 mg total) by mouth 2 (two) times daily.  . fexofenadine (ALLEGRA ALLERGY) 180 MG tablet Take 1 tablet (180 mg total) by mouth daily.  . fluticasone (FLONASE) 50 MCG/ACT nasal spray Place 1 spray into both nostrils daily.  Marland Kitchen ketoconazole (NIZORAL) 2 % cream SMARTSIG:1 Application Topical Morning-Night  . LORazepam (ATIVAN) 0.5 MG tablet Take 0.25-0.5 mg by mouth 2 (two) times daily as needed.  Marland Kitchen losartan (COZAAR) 25 MG tablet Take 1 tablet (25 mg total) by mouth daily.  . magnesium oxide (MAG-OX) 400 MG tablet TAKE ONE TABLET EVERY DAY  . Magnesium Oxide 400 (240 Mg) MG TABS TAKE ONE TABLET EVERY DAY  . METAMUCIL FIBER PO Take by mouth.  . mirtazapine (REMERON) 30 MG tablet Take 30 mg by mouth at bedtime.  Marland Kitchen nystatin cream (MYCOSTATIN) Apply 1 application topically 2 (two) times daily.  . rosuvastatin (CRESTOR) 5 MG tablet Take one tablet q Monday, Wednesday and Friday.  . saccharomyces boulardii (FLORASTOR) 250 MG capsule Take 1 capsule (250 mg total) by mouth 2 (two) times daily.  . sodium chloride (OCEAN) 0.65 % nasal spray Place 1-2 sprays into the nose as needed.  .  [DISCONTINUED] QUEtiapine (SEROQUEL) 25 MG tablet Take 1/2 pill in morning and a whole pill at bedtime.  . furosemide (LASIX) 40 MG tablet Take 1 tablet (40 mg total) by mouth daily.  . [DISCONTINUED] atorvastatin (LIPITOR) 80 MG tablet TAKE 1 TABLET BY MOUTH AT BEDTIME. (Patient not taking: Reported on 02/14/2021)  . [DISCONTINUED] mirtazapine (REMERON) 45 MG tablet Take 1 tablet (45 mg total) by mouth at bedtime. (Patient not taking: Reported on 02/14/2021)   Facility-Administered Encounter Medications as of 02/14/2021  Medication  . 0.9 %  sodium chloride infusion  . acetaminophen (TYLENOL) tablet 650 mg    Review of Systems  Constitutional: Negative  for appetite change and unexpected weight change.  HENT: Negative for congestion and sinus pressure.   Respiratory: Negative for cough, chest tightness and shortness of breath.   Cardiovascular: Negative for chest pain and palpitations.       Intermittent ankle/pedal edema.    Gastrointestinal: Negative for abdominal pain, diarrhea, nausea and vomiting.  Genitourinary: Negative for difficulty urinating and dysuria.  Musculoskeletal: Negative for joint swelling and myalgias.  Skin: Negative for color change and rash.  Neurological: Negative for dizziness, light-headedness and headaches.  Psychiatric/Behavioral: Negative for agitation.       Increased stress related to recent CVA and blood pressure.        Objective:    Physical Exam Vitals reviewed.  Constitutional:      General: She is not in acute distress.    Appearance: Normal appearance.  HENT:     Head: Normocephalic and atraumatic.     Right Ear: External ear normal.     Left Ear: External ear normal.  Eyes:     General: No scleral icterus.       Right eye: No discharge.        Left eye: No discharge.     Conjunctiva/sclera: Conjunctivae normal.  Neck:     Thyroid: No thyromegaly.  Cardiovascular:     Rate and Rhythm: Normal rate and regular rhythm.  Pulmonary:      Effort: No respiratory distress.     Breath sounds: Normal breath sounds. No wheezing.  Abdominal:     General: Bowel sounds are normal.     Palpations: Abdomen is soft.     Tenderness: There is no abdominal tenderness.  Musculoskeletal:        General: No swelling or tenderness.     Cervical back: Neck supple. No tenderness.  Lymphadenopathy:     Cervical: No cervical adenopathy.  Skin:    Findings: No erythema or rash.  Neurological:     Mental Status: She is alert.  Psychiatric:        Mood and Affect: Mood normal.        Behavior: Behavior normal.     BP 134/82 (BP Location: Left Arm, Patient Position: Sitting, Cuff Size: Normal)   Pulse 96   Temp 98 F (36.7 C) (Oral)   Resp 15   Ht _0  (1.6 m)   Wt 177 lb 6.4 oz (80.5 kg)   SpO2 96%   BMI 31.42 kg/m  Wt Readings from Last 3 Encounters:  02/21/21 177 lb (80.3 kg)  02/14/21 177 lb 6.4 oz (80.5 kg)  01/23/21 165 lb (74.8 kg)     Lab Results  Component Value Date   WBC 6.9 12/24/2020   HGB 12.9 12/24/2020   HCT 39.0 12/24/2020   PLT 297 12/24/2020   GLUCOSE 105 (H) 12/26/2020   CHOL 243 (H) 12/09/2020   TRIG 141 12/09/2020   HDL 72 12/09/2020   LDLDIRECT 127.0 08/17/2020   LDLCALC 143 (H) 12/09/2020   ALT 24 12/14/2020   AST 22 12/14/2020   NA 139 12/26/2020   K 4.2 12/26/2020   CL 105 12/26/2020   CREATININE 0.68 12/26/2020   BUN 11 12/26/2020   CO2 25 12/26/2020   TSH 2.522 12/09/2020   HGBA1C 5.7 (H) 12/09/2020       Assessment & Plan:   Problem List Items Addressed This Visit    Depression, recurrent ([place])    Followed by psychiatry.  Started back on seroquel.  Continues remeron.  Follow.  Relevant Medications   LORazepam (ATIVAN) 0.5 MG tablet   mirtazapine (REMERON) 30 MG tablet   History of CVA (cerebrovascular accident)    Increased stress related.  Restart statin medication.  Add losartan for better blood pressure control.  Continue plavix.       Hypercholesterolemia     Given recent CVA, discussed the need for statin medication.  Intolerance to high dose lipitor.  Start crestor.  Discussed.  Follow lipid panel and liver function tests.        Relevant Medications   losartan (COZAAR) 25 MG tablet   rosuvastatin (CRESTOR) 5 MG tablet   Other Relevant Orders   Hepatic function panel   Hyperglycemia    Low carb diet and exercise.  Follow met b and a1c.       Hypertension    Blood pressure overall improved. On amlodipine.  Given intermittent pedal edema, will hold on increasing amlodipine.  Add losartan.  Start with 74m and follow pressures.  Follow metabolic panel.  Check met b in 10-14 days.        Relevant Medications   losartan (COZAAR) 25 MG tablet   rosuvastatin (CRESTOR) 5 MG tablet   Other Relevant Orders   Basic metabolic panel   Lower extremity edema    Overall improved. Discussed with her. Intermittent worsening.  Discussed weighing herself. Discussed avoidance of increased sodium intake.  Leg elevation.  Discussed prn lasix.  Follow. Compression hose.       Multiple sclerosis ([place]    Continue f/u and treatment with neurology.           CEinar Pheasant MD

## 2021-02-18 DIAGNOSIS — M21372 Foot drop, left foot: Secondary | ICD-10-CM | POA: Diagnosis not present

## 2021-02-18 DIAGNOSIS — F482 Pseudobulbar affect: Secondary | ICD-10-CM | POA: Diagnosis not present

## 2021-02-18 DIAGNOSIS — E785 Hyperlipidemia, unspecified: Secondary | ICD-10-CM | POA: Diagnosis not present

## 2021-02-18 DIAGNOSIS — I10 Essential (primary) hypertension: Secondary | ICD-10-CM | POA: Diagnosis not present

## 2021-02-18 DIAGNOSIS — G35 Multiple sclerosis: Secondary | ICD-10-CM | POA: Diagnosis not present

## 2021-02-18 DIAGNOSIS — I69351 Hemiplegia and hemiparesis following cerebral infarction affecting right dominant side: Secondary | ICD-10-CM | POA: Diagnosis not present

## 2021-02-20 ENCOUNTER — Other Ambulatory Visit: Payer: Self-pay

## 2021-02-20 ENCOUNTER — Ambulatory Visit (INDEPENDENT_AMBULATORY_CARE_PROVIDER_SITE_OTHER): Payer: Medicare Other | Admitting: Dermatology

## 2021-02-20 DIAGNOSIS — Z85828 Personal history of other malignant neoplasm of skin: Secondary | ICD-10-CM

## 2021-02-20 DIAGNOSIS — L814 Other melanin hyperpigmentation: Secondary | ICD-10-CM | POA: Diagnosis not present

## 2021-02-20 DIAGNOSIS — L578 Other skin changes due to chronic exposure to nonionizing radiation: Secondary | ICD-10-CM | POA: Diagnosis not present

## 2021-02-20 DIAGNOSIS — L304 Erythema intertrigo: Secondary | ICD-10-CM | POA: Diagnosis not present

## 2021-02-20 DIAGNOSIS — D1800 Hemangioma unspecified site: Secondary | ICD-10-CM | POA: Diagnosis not present

## 2021-02-20 DIAGNOSIS — L821 Other seborrheic keratosis: Secondary | ICD-10-CM

## 2021-02-20 DIAGNOSIS — Z1283 Encounter for screening for malignant neoplasm of skin: Secondary | ICD-10-CM | POA: Diagnosis not present

## 2021-02-20 DIAGNOSIS — D229 Melanocytic nevi, unspecified: Secondary | ICD-10-CM

## 2021-02-20 MED ORDER — KETOCONAZOLE 2 % EX CREA
1.0000 | TOPICAL_CREAM | Freq: Two times a day (BID) | CUTANEOUS | 11 refills | Status: AC
Start: 2021-02-20 — End: 2021-04-03

## 2021-02-20 NOTE — Patient Instructions (Addendum)
Recommend Vanicream - for face moisturizer   Recommend Cerave SA Cream for body moisturizer. Can try it for face but if it causes any burning discontinue   Recommend Zeasorb Powder - for folds of skin to help with moisture   Gentle Skin Care Guide  1. Bathe no more than once a day.  2. Avoid bathing in hot water  3. Use a mild soap like Dove, Vanicream, Cetaphil, CeraVe. Can use Lever 2000 or Cetaphil antibacterial soap  4. Use soap only where you need it. On most days, use it under your arms, between your legs, and on your feet. Let the water rinse other areas unless visibly dirty.  5. When you get out of the bath/shower, use a towel to gently blot your skin dry, don't rub it.  6. While your skin is still a little damp, apply a moisturizing cream such as Vanicream, CeraVe, Cetaphil, Eucerin, Sarna lotion or plain Vaseline Jelly. For hands apply Neutrogena [place] Hand Cream or Excipial Hand Cream.  7. Reapply moisturizer any time you start to itch or feel dry.  8. Sometimes using free and clear laundry detergents can be helpful. Fabric softener sheets should be avoided. Downy Free & Gentle liquid, or any liquid fabric softener that is free of dyes and perfumes, it acceptable to use  9. If your doctor has given you prescription creams you may apply moisturizers over them    Melanoma ABCDEs  Melanoma is the most dangerous type of skin cancer, and is the leading cause of death from skin disease.  You are more likely to develop melanoma if you:  Have light-colored skin, light-colored eyes, or red or blond hair  Spend a lot of time in the sun  Tan regularly, either outdoors or in a tanning bed  Have had blistering sunburns, especially during childhood  Have a close family member who has had a melanoma  Have atypical moles or large birthmarks  Early detection of melanoma is key since treatment is typically straightforward and cure rates are extremely high if we catch it early.    The first sign of melanoma is often a change in a mole or a new dark spot.  The ABCDE system is a way of remembering the signs of melanoma.  A for asymmetry:  The two halves do not match. B for border:  The edges of the growth are irregular. C for color:  A mixture of colors are present instead of an even brown color. D for diameter:  Melanomas are usually (but not always) greater than 25mm - the size of a pencil eraser. E for evolution:  The spot keeps changing in size, shape, and color.  Please check your skin once per month between visits. You can use a small mirror in front and a large mirror behind you to keep an eye on the back side or your body.   If you see any new or changing lesions before your next follow-up, please call to schedule a visit.  Please continue daily skin protection including broad spectrum sunscreen SPF 30+ to sun-exposed areas, reapplying every 2 hours as needed when you're outdoors.   Staying in the shade or wearing long sleeves, sun glasses (UVA+UVB protection) and wide brim hats (4-inch brim around the entire circumference of the hat) are also recommended for sun protection.   Recommend taking Heliocare sun protection supplement daily in sunny weather for additional sun protection. For maximum protection on the sunniest days, you can take up to 2  capsules of regular Heliocare OR take 1 capsule of Heliocare Ultra. For prolonged exposure (such as a full day in the sun), you can repeat your dose of the supplement 4 hours after your first dose. Heliocare can be purchased at [facility] or at [email or web address].   If you have any questions or concerns for your doctor, please call our main line at [phone] and press option 4 to reach your doctor's medical assistant. If no one answers, please leave a voicemail as directed and we will return your call as soon as possible. Messages left after 4 pm will be answered the following business day.   You may also send  [place] a message via [employer]. We typically respond to MyChart messages within 1-2 business days.  For prescription refills, please ask your pharmacy to contact our office. Our fax number is [phone].  If you have an urgent issue when the clinic is closed that cannot wait until the next business day, you can page your doctor at the number below.    Please note that while we do our best to be available for urgent issues outside of office hours, we are not available 24/7.   If you have an urgent issue and are unable to reach [place], you may choose to seek medical care at your doctor's office, retail clinic, urgent care center, or emergency room.  If you have a medical emergency, please immediately call 911 or go to the emergency department.  Pager Numbers  - Dr. Nehemiah Massed: [phone]  - Dr. Laurence Ferrari: [phone]  - Dr. Nicole Kindred: [phone]  In the event of inclement weather, please call our main line at [phone] for an update on the status of any delays or closures.  Dermatology Medication Tips: Please keep the boxes that topical medications come in in order to help keep track of the instructions about where and how to use these. Pharmacies typically print the medication instructions only on the boxes and not directly on the medication tubes.   If your medication is too expensive, please contact our office at [phone] option 4 or send [place] a message through [employer].   We are unable to tell what your co-pay for medications will be in advance as this is different depending on your insurance coverage. However, we may be able to find a substitute medication at lower cost or fill out paperwork to get insurance to cover a needed medication.   If a prior authorization is required to get your medication covered by your insurance company, please allow [place] 1-2 business days to complete this process.  Drug prices often vary depending on where the prescription is filled and some pharmacies may offer  cheaper prices.  The website www.goodrx.com contains coupons for medications through different pharmacies. The prices here do not account for what the cost may be with help from insurance (it may be cheaper with your insurance), but the website can give you the price if you did not use any insurance.  - You can print the associated coupon and take it with your prescription to the pharmacy.  - You may also stop by our office during regular business hours and pick up a GoodRx coupon card.  - If you need your prescription sent electronically to a different pharmacy, notify our office through [facility] or by phone at [phone] option 4.

## 2021-02-20 NOTE — Progress Notes (Signed)
   Follow-Up Visit   Subjective  Carly [name] is a 70 y.o. female who presents for the following: tbse (Patient here today for yearly tbse. She reports no new spots of concern. She reports she has dry skin and not currently using any cream to help. ).  She is bothered by rash at the low abdomen  Patient here for full body skin exam and skin cancer screening.   The following portions of the chart were reviewed this encounter and updated as appropriate:  Tobacco  Allergies  Meds  Problems  Med Hx  Surg Hx  Fam Hx      Objective  Well appearing patient in no apparent distress; mood and affect are within normal limits.  A full examination was performed including scalp, head, eyes, ears, nose, lips, neck, chest, axillae, abdomen, back, buttocks, bilateral upper extremities, bilateral lower extremities, hands, feet, fingers, toes, fingernails, and toenails. All findings within normal limits unless otherwise noted below.  Objective  Left Inguinal Area, Right Inguinal Area: Erythematous patches  Assessment & Plan  Erythema intertrigo (2) Left Inguinal Area; Right Inguinal Area  Restart ketoconazole 2 % cream  apply to affected area twice daily until clear Restart Hydrocortisone 2.5% cream apply to affected area  twice daily for up two 2 weeks until clear  Recommend Zeasorb powder  .  Reordered Medications ketoconazole (NIZORAL) 2 % cream   Lentigines - Scattered tan macules - Due to sun exposure - Benign-appering, observe - Recommend daily broad spectrum sunscreen SPF 30+ to sun-exposed areas, reapply every 2 hours as needed. - Call for any changes  Seborrheic Keratoses - Stuck-on, waxy, tan-brown papules and/or plaques right frontal hairline  - Benign-appearing - Discussed benign etiology and prognosis. - Observe - Call for any changes  Melanocytic Nevi - Tan-brown and/or pink-flesh-colored symmetric macules and papules - Benign appearing on exam today -  Observation - Call clinic for new or changing moles - Recommend daily use of broad spectrum spf 30+ sunscreen to sun-exposed areas.   Hemangiomas - Red papules - Discussed benign nature - Observe - Call for any changes  Actinic Damage - Chronic condition, secondary to cumulative UV/sun exposure - diffuse scaly erythematous macules with underlying dyspigmentation - Recommend daily broad spectrum sunscreen SPF 30+ to sun-exposed areas, reapply every 2 hours as needed.  - Staying in the shade or wearing long sleeves, sun glasses (UVA+UVB protection) and wide brim hats (4-inch brim around the entire circumference of the hat) are also recommended for sun protection.  - Call for new or changing lesions.  History of Basal Cell Carcinoma of the Skin - No evidence of recurrence today at nose  - Recommend regular full body skin exams - Recommend daily broad spectrum sunscreen SPF 30+ to sun-exposed areas, reapply every 2 hours as needed.  - Call if any new or changing lesions are noted between office visits   Skin cancer screening performed today.  Return in about 1 year (around 02/20/2022) for tbse.  I, Ruthell Rummage, CMA, am acting as scribe for Forest Gleason, MD.  Documentation: I have reviewed the above documentation for accuracy and completeness, and I agree with the above.  Forest Gleason, MD

## 2021-02-21 ENCOUNTER — Other Ambulatory Visit: Payer: Self-pay

## 2021-02-21 ENCOUNTER — Encounter: Payer: Self-pay | Admitting: Physical Medicine and Rehabilitation

## 2021-02-21 ENCOUNTER — Encounter
Payer: Medicare Other | Attending: Physical Medicine and Rehabilitation | Admitting: Physical Medicine and Rehabilitation

## 2021-02-21 VITALS — BP 143/84 | HR 93 | Temp 97.9°F | Ht 63.0 in | Wt 177.0 lb

## 2021-02-21 DIAGNOSIS — I10 Essential (primary) hypertension: Secondary | ICD-10-CM | POA: Insufficient documentation

## 2021-02-21 DIAGNOSIS — G35 Multiple sclerosis: Secondary | ICD-10-CM | POA: Diagnosis not present

## 2021-02-21 DIAGNOSIS — I890 Lymphedema, not elsewhere classified: Secondary | ICD-10-CM | POA: Diagnosis not present

## 2021-02-21 DIAGNOSIS — F411 Generalized anxiety disorder: Secondary | ICD-10-CM

## 2021-02-21 MED ORDER — BACLOFEN 10 MG PO TABS: 10.0000 mg | ORAL_TABLET | Freq: Every day | ORAL | 3 refills | Status: DC

## 2021-02-21 MED ORDER — QUETIAPINE FUMARATE 25 MG PO TABS: ORAL_TABLET | ORAL | 0 refills | Status: DC

## 2021-02-21 NOTE — Progress Notes (Signed)
Subjective:    Patient ID: Carly [name], female    DOB: 08-Feb-1951, 70 y.o.   MRN: [id number]  HPI   Carly [name] is a 70 year old woman who was admitted to CIR with white matter periventricular CVA, presents for f/u regarding anxiety  1) Anxiety:  -her psychiatrist transitioned her from Seroquel back to benzodiazepine due to side effect profile of Seroquel. -since she stopped the Seroquel, her anxiety has increased, and she doesn't know what to do. -she and husband [name] were scared of side effects of Seroquel that were discussed  -she has been sleeping but her legs feel very stiff at night -she continues to take Cymbalta 60mg  and [name] 45mg  as well. -she discussed with her husband, who is also present on this call today, and she would prefer to restart Seroquel and stop Lorazepam.  -she has not had any counseling and would like to restart this. She has an appointment with [name] in August and would like to start counseling earlier than this if possible.  -she loved speaking with [name] inpatient and would like to follow with him -she had symptoms of dizziness yesterday at the hairdresser and was asked to contact me regarding if this could be from the seroquel. She had had no other similar episodes since starting it.  -she has been sleeping well with the Seroquel at night.  -she would like a list of foods that can help anxiety.  -she has been using essential oil.   2) MS: -has been having a lot of stiffness recently -EMS called twice due to difficulty getting her up off the floor -is receiving home therapy  3) Lower extremity exam:  -has significant bilateral lower extremity   4) HTN: SBP slightly elevated.Checking Bps.    Pain Inventory Average Pain 3 Pain Right Now 3 My pain is constant  In the last 24 hours, has pain interfered with the following? General activity n/a Relation with others n/a Enjoyment of life n/a What TIME of day is your  pain at its worst? varies Sleep (in general) Good  Pain is worse with: unsure Pain improves with: n/a Relief from Meds: n/a  Family History  Problem Relation Age of Onset  . Arthritis Mother   . Hypertension Mother   . Macular degeneration Mother   . Hypertension Father   . Hyperlipidemia Father   . Heart disease Maternal Grandfather   . Diabetes Maternal Grandfather   . Kidney disease Paternal Grandmother    Social History   Socioeconomic History  . Marital status: Married    Spouse name: Not on file  . Number of children: Not on file  . Years of education: Not on file  . Highest education level: Not on file  Occupational History  . Not on file  Tobacco Use  . Smoking status: Never Smoker  . Smokeless tobacco: Never Used  Vaping Use  . Vaping Use: Never used  Substance and Sexual Activity  . Alcohol use: No    Alcohol/week: 0.0 standard drinks  . Drug use: No  . Sexual activity: Not Currently  Other Topics Concern  . Not on file  Social History Narrative   Married    Social Determinants of Health   Financial Resource Strain: Low Risk   . Difficulty of Paying Living Expenses: Not hard at all  Food Insecurity: No Food Insecurity  . Worried About Charity fundraiser in the Last Year: Never true  . Ran Out of  Food in the Last Year: Never true  Transportation Needs: No Transportation Needs  . Lack of Transportation (Medical): No  . Lack of Transportation (Non-Medical): No  Physical Activity: Not on file  Stress: No Stress Concern Present  . Feeling of Stress : Not at all  Social Connections: Unknown  . Frequency of Communication with Friends and Family: Not on file  . Frequency of Social Gatherings with Friends and Family: Not on file  . Attends Religious Services: Not on file  . Active Member of Clubs or Organizations: Not on file  . Attends Archivist Meetings: Not on file  . Marital Status: Married   Past Surgical History:  Procedure  Laterality Date  . BACK SURGERY    . CHOLECYSTECTOMY    . COLONOSCOPY WITH PROPOFOL N/A 05/18/2017   Procedure: COLONOSCOPY WITH PROPOFOL;  Surgeon: [name], MD;  Location: [facility] ENDOSCOPY;  Service: Endoscopy;  Laterality: N/A;  . FOOT SURGERY  2015  . GALLBLADDER SURGERY  2008  . HARDWARE REMOVAL Left 02/14/2016   Procedure: LEFT FOOT REMOVAL DEEP IMPLANT;  Surgeon: [name], MD;  Location: [place];  Service: Orthopedics;  Laterality: Left;  . HERNIA REPAIR     Inguinal Hernia Repair  . SPINE SURGERY  2014   Past Surgical History:  Procedure Laterality Date  . BACK SURGERY    . CHOLECYSTECTOMY    . COLONOSCOPY WITH PROPOFOL N/A 05/18/2017   Procedure: COLONOSCOPY WITH PROPOFOL;  Surgeon: [name], MD;  Location: [facility] ENDOSCOPY;  Service: Endoscopy;  Laterality: N/A;  . FOOT SURGERY  2015  . GALLBLADDER SURGERY  2008  . HARDWARE REMOVAL Left 02/14/2016   Procedure: LEFT FOOT REMOVAL DEEP IMPLANT;  Surgeon: [name], MD;  Location: [place];  Service: Orthopedics;  Laterality: Left;  . HERNIA REPAIR     Inguinal Hernia Repair  . SPINE SURGERY  2014   Past Medical History:  Diagnosis Date  . Allergy   . Anxiety   . Aspiration pneumonia ([place])   . Depression   . Frequent headaches    H/O  . GERD (gastroesophageal reflux disease)   . History of chicken pox   . History of colon polyps   . Hx of migraines   . Multiple sclerosis ([place])   . PONV (postoperative nausea and vomiting)    There were no vitals taken for this visit.  Opioid Risk Score:   Fall Risk Score:  `1  Depression screen PHQ 2/9  Depression screen [facility] 2/9 11/02/2020 07/12/2019 03/14/2019 03/10/2017 07/09/2016  Decreased Interest 0 0 0 1 0  Down, Depressed, Hopeless 0 0 0 1 0  PHQ - 2 Score 0 0 0 2 0  Altered sleeping - 0 0 0 -  Tired, decreased energy - 0 0 0 -  Change in appetite - 0 0 0 -  Feeling bad or failure about yourself  - 0 0 0 -  Trouble  concentrating - 0 0 0 -  Moving slowly or fidgety/restless - 0 0 0 -  Suicidal thoughts - 0 0 0 -  PHQ-9 Score - 0 0 2 -  Difficult doing work/chores - Not difficult at all Not difficult at all Very difficult -  Some recent data might be hidden    Review of Systems  Constitutional: Negative.   HENT: Negative.   Eyes: Negative.   Respiratory: Negative.   Cardiovascular: Positive for leg swelling.  Gastrointestinal: Positive for nausea.  Endocrine:  Negative.   Genitourinary: Negative.   Musculoskeletal: Positive for back pain, neck pain and neck stiffness.  Skin: Negative.   Allergic/Immunologic: Negative.   Neurological: Negative.   Hematological: Negative.   Psychiatric/Behavioral: Negative.   All other systems reviewed and are negative.      Objective:   Physical Exam Gen: no distress, normal appearing HEENT: oral mucosa pink and moist, NCAT Cardio: Reg rate Chest: normal effort, normal rate of breathing Abd: soft, non-distended Ext: bilateral lower extremity edema. Psych: pleasant, normal affect Skin: intact Neuro: Alert and oriented x3 Musculoskeletal: 5/5 strength throughout     Assessment & Plan:  1) Anxiety: -discussed with patient the side effects of both Seroquel and Lorazepam, as well as potential interactions with other medications. -advised her to discuss with Bill the risks and benefits of both medications.  -discussed that we can follow-up tomorrow after their conversation to see what they decide -continue to encourage non-pharmacologic management approaches as well, such as meditation, applying lavender oil, and exercise- all of which she has been trying and which have been helping.  -discussed alternative anxiety medications.  -continue seroquel 12.5mg  daily and 25mg  at night. Can decreased to 25mg  at night and assess positive/negative effects during the day. Can restart 1/2 tab during the day if anxiety is uncontrolled.  -discontinue lorazepam.   -establish care with [name] for neuropsych counseling on 8/2.   2) MS -continue home therapy to minimize stiffness/maximize mobility -f/u with MS specialist.   3) HTN: BP has been 130s-150s/80s, recommended citrus foods and nuts, as much mobility as she can tolerate, log Bps daily and bring log to f/u appointment. If remains >892J systolic at this visit, can consider increasing Norvasc to 10mg .  -continue current regimen and checking and checking BP.   4) Bilateral lower extremity edema: - referred for lymphedema therapy -discussed response to Lasix.

## 2021-02-21 NOTE — Addendum Note (Signed)
Addended by: [name] on: 02/21/2021 11:32 AM   Modules accepted: Orders

## 2021-02-21 NOTE — Patient Instructions (Signed)
Anxiety: 1) [place] nuts, mushrooms, soy beans due to their high selenium content. Upper limit of toxicity of selenium is 486mcg/day so no more than 3-4 [place] nuts per day.  2) Fatty fish such as salmon, mackerel, sardines, trout, and herring- high in omega-3 fatty acids 3) Eggs- increases serotonin and dopamine 4) Pumpkin seeds- high in omega-3 fatty acids 5) dark chocolate- high in flavanols that increase blood flow to brain 6) turmeric- take with black pepper to increase absorption 7) chamomile tea- antioxidant and anti-inflammatory properties 8) yogurt without sugar- supports gut-brain axis 9) green tea- contains L- theanine 10) blueberries- high in vitamin C and antioxidants 11) [place]- high in tryptophan which gets converted to serotonin 12) bell peppers- rich in vitamin C and antioxidants 13) citrus fruits- rich in vitamin C and antioxidants 14) almonds- high in vitamin E and healthy fats 15) chia seeds- high in omega-3 fatty acids

## 2021-02-22 ENCOUNTER — Telehealth: Payer: Self-pay | Admitting: *Deleted

## 2021-02-22 NOTE — Telephone Encounter (Signed)
Carly [name] OT [facility] called to ext and recert [name] for 2wk4.  Approval given.

## 2021-02-22 NOTE — Telephone Encounter (Signed)
Carly [name], PT, [facility] left a message asking forverbal orders for 1 makeup visit and continuation orders 1wk1, 2wk3, 1wk1.  Medical record reviewed. Social work note reviewed.  Verbal orders given per office protocol.

## 2021-02-23 ENCOUNTER — Other Ambulatory Visit: Payer: Self-pay | Admitting: Internal Medicine

## 2021-02-24 ENCOUNTER — Encounter: Payer: Self-pay | Admitting: Internal Medicine

## 2021-02-24 NOTE — Assessment & Plan Note (Signed)
Given recent CVA, discussed the need for statin medication.  Intolerance to high dose lipitor.  Start crestor.  Discussed.  Follow lipid panel and liver function tests.

## 2021-02-24 NOTE — Assessment & Plan Note (Signed)
Followed by psychiatry.  Started back on seroquel.  Continues remeron.  Follow.

## 2021-02-24 NOTE — Assessment & Plan Note (Signed)
Continue f/u and treatment with neurology.

## 2021-02-24 NOTE — Assessment & Plan Note (Signed)
Increased stress related.  Restart statin medication.  Add losartan for better blood pressure control.  Continue plavix.

## 2021-02-24 NOTE — Assessment & Plan Note (Signed)
Blood pressure overall improved. On amlodipine.  Given intermittent pedal edema, will hold on increasing amlodipine.  Add losartan.  Start with 17m and follow pressures.  Follow metabolic panel.  Check met b in 10-14 days.

## 2021-02-24 NOTE — Assessment & Plan Note (Signed)
Low carb diet and exercise.  Follow met b and a1c.  

## 2021-02-24 NOTE — Assessment & Plan Note (Signed)
Overall improved. Discussed with her. Intermittent worsening.  Discussed weighing herself. Discussed avoidance of increased sodium intake.  Leg elevation.  Discussed prn lasix.  Follow. Compression hose.

## 2021-02-25 DIAGNOSIS — I69351 Hemiplegia and hemiparesis following cerebral infarction affecting right dominant side: Secondary | ICD-10-CM | POA: Diagnosis not present

## 2021-02-25 DIAGNOSIS — M21372 Foot drop, left foot: Secondary | ICD-10-CM | POA: Diagnosis not present

## 2021-02-25 DIAGNOSIS — I10 Essential (primary) hypertension: Secondary | ICD-10-CM | POA: Diagnosis not present

## 2021-02-25 DIAGNOSIS — G35 Multiple sclerosis: Secondary | ICD-10-CM | POA: Diagnosis not present

## 2021-02-25 DIAGNOSIS — F482 Pseudobulbar affect: Secondary | ICD-10-CM | POA: Diagnosis not present

## 2021-02-25 DIAGNOSIS — E785 Hyperlipidemia, unspecified: Secondary | ICD-10-CM | POA: Diagnosis not present

## 2021-02-26 DIAGNOSIS — M21372 Foot drop, left foot: Secondary | ICD-10-CM | POA: Diagnosis not present

## 2021-02-26 DIAGNOSIS — K219 Gastro-esophageal reflux disease without esophagitis: Secondary | ICD-10-CM | POA: Diagnosis not present

## 2021-02-26 DIAGNOSIS — Z6831 Body mass index (BMI) 31.0-31.9, adult: Secondary | ICD-10-CM | POA: Diagnosis not present

## 2021-02-26 DIAGNOSIS — I69354 Hemiplegia and hemiparesis following cerebral infarction affecting left non-dominant side: Secondary | ICD-10-CM | POA: Insufficient documentation

## 2021-02-26 DIAGNOSIS — G35 Multiple sclerosis: Secondary | ICD-10-CM | POA: Diagnosis not present

## 2021-02-26 DIAGNOSIS — M899 Disorder of bone, unspecified: Secondary | ICD-10-CM | POA: Diagnosis not present

## 2021-02-26 DIAGNOSIS — N83201 Unspecified ovarian cyst, right side: Secondary | ICD-10-CM | POA: Diagnosis not present

## 2021-02-26 DIAGNOSIS — E785 Hyperlipidemia, unspecified: Secondary | ICD-10-CM | POA: Diagnosis not present

## 2021-02-26 DIAGNOSIS — E7800 Pure hypercholesterolemia, unspecified: Secondary | ICD-10-CM | POA: Diagnosis not present

## 2021-02-26 DIAGNOSIS — F419 Anxiety disorder, unspecified: Secondary | ICD-10-CM | POA: Diagnosis not present

## 2021-02-26 DIAGNOSIS — F482 Pseudobulbar affect: Secondary | ICD-10-CM | POA: Diagnosis not present

## 2021-02-26 DIAGNOSIS — Z8619 Personal history of other infectious and parasitic diseases: Secondary | ICD-10-CM | POA: Diagnosis not present

## 2021-02-26 DIAGNOSIS — E559 Vitamin D deficiency, unspecified: Secondary | ICD-10-CM | POA: Diagnosis not present

## 2021-02-26 DIAGNOSIS — F411 Generalized anxiety disorder: Secondary | ICD-10-CM | POA: Diagnosis not present

## 2021-02-26 DIAGNOSIS — G8929 Other chronic pain: Secondary | ICD-10-CM | POA: Diagnosis not present

## 2021-02-26 DIAGNOSIS — Z8701 Personal history of pneumonia (recurrent): Secondary | ICD-10-CM | POA: Diagnosis not present

## 2021-02-26 DIAGNOSIS — I10 Essential (primary) hypertension: Secondary | ICD-10-CM | POA: Diagnosis not present

## 2021-02-26 DIAGNOSIS — N83202 Unspecified ovarian cyst, left side: Secondary | ICD-10-CM | POA: Diagnosis not present

## 2021-02-26 DIAGNOSIS — F32A Depression, unspecified: Secondary | ICD-10-CM | POA: Diagnosis not present

## 2021-02-26 DIAGNOSIS — Z86010 Personal history of colonic polyps: Secondary | ICD-10-CM | POA: Diagnosis not present

## 2021-02-26 DIAGNOSIS — E669 Obesity, unspecified: Secondary | ICD-10-CM | POA: Diagnosis not present

## 2021-02-26 DIAGNOSIS — M949 Disorder of cartilage, unspecified: Secondary | ICD-10-CM | POA: Diagnosis not present

## 2021-02-26 DIAGNOSIS — G43909 Migraine, unspecified, not intractable, without status migrainosus: Secondary | ICD-10-CM | POA: Diagnosis not present

## 2021-02-26 DIAGNOSIS — M5136 Other intervertebral disc degeneration, lumbar region: Secondary | ICD-10-CM | POA: Diagnosis not present

## 2021-02-26 DIAGNOSIS — J302 Other seasonal allergic rhinitis: Secondary | ICD-10-CM | POA: Diagnosis not present

## 2021-02-26 DIAGNOSIS — I69351 Hemiplegia and hemiparesis following cerebral infarction affecting right dominant side: Secondary | ICD-10-CM | POA: Diagnosis not present

## 2021-02-26 DIAGNOSIS — K5900 Constipation, unspecified: Secondary | ICD-10-CM | POA: Diagnosis not present

## 2021-02-26 DIAGNOSIS — G8114 Spastic hemiplegia affecting left nondominant side: Secondary | ICD-10-CM | POA: Diagnosis not present

## 2021-02-27 ENCOUNTER — Telehealth: Payer: Self-pay | Admitting: Internal Medicine

## 2021-02-27 NOTE — Telephone Encounter (Signed)
Lab appt has been cancelled. Spoke with aide from [facility] and was given contact information for the nurses to see if they can come out to patients home and draw her labs. Patient is aware.

## 2021-02-27 NOTE — Telephone Encounter (Signed)
PT husband came into the office today to inform us that the PT is unable to get up and down the stairs at home. They had to get EMT yesterday to help her get down the stairs to go to a Dr appointment. PT husband in the meantime states that he would like to find out if the labs need to be done or can be reschedule. If they need to be done asap then can someone come out to his home to do the labs for the PT.

## 2021-02-28 ENCOUNTER — Other Ambulatory Visit: Payer: Medicare Other

## 2021-02-28 DIAGNOSIS — I10 Essential (primary) hypertension: Secondary | ICD-10-CM | POA: Diagnosis not present

## 2021-02-28 DIAGNOSIS — G35 Multiple sclerosis: Secondary | ICD-10-CM | POA: Diagnosis not present

## 2021-02-28 DIAGNOSIS — F482 Pseudobulbar affect: Secondary | ICD-10-CM | POA: Diagnosis not present

## 2021-02-28 DIAGNOSIS — I69351 Hemiplegia and hemiparesis following cerebral infarction affecting right dominant side: Secondary | ICD-10-CM | POA: Diagnosis not present

## 2021-02-28 DIAGNOSIS — E785 Hyperlipidemia, unspecified: Secondary | ICD-10-CM | POA: Diagnosis not present

## 2021-02-28 DIAGNOSIS — M21372 Foot drop, left foot: Secondary | ICD-10-CM | POA: Diagnosis not present

## 2021-03-01 ENCOUNTER — Telehealth: Payer: Self-pay

## 2021-03-01 ENCOUNTER — Other Ambulatory Visit: Payer: Medicare Other

## 2021-03-01 DIAGNOSIS — I10 Essential (primary) hypertension: Secondary | ICD-10-CM | POA: Diagnosis not present

## 2021-03-01 DIAGNOSIS — M21372 Foot drop, left foot: Secondary | ICD-10-CM | POA: Diagnosis not present

## 2021-03-01 DIAGNOSIS — F482 Pseudobulbar affect: Secondary | ICD-10-CM | POA: Diagnosis not present

## 2021-03-01 DIAGNOSIS — I69351 Hemiplegia and hemiparesis following cerebral infarction affecting right dominant side: Secondary | ICD-10-CM | POA: Diagnosis not present

## 2021-03-01 DIAGNOSIS — E785 Hyperlipidemia, unspecified: Secondary | ICD-10-CM | POA: Diagnosis not present

## 2021-03-01 DIAGNOSIS — G35 Multiple sclerosis: Secondary | ICD-10-CM | POA: Diagnosis not present

## 2021-03-01 NOTE — Telephone Encounter (Signed)
[name] in-home physical therapist with [facility] called:  Carly [name] had a fall on 02/26/2021. It happen while her husband was trying to get her upstairs.    No injuries, No change in function. (Call back phone [phone]).

## 2021-03-01 NOTE — Telephone Encounter (Signed)
Pt is going to come in Monday for labs because she has another doctors appointment in [place]

## 2021-03-01 NOTE — Telephone Encounter (Signed)
Pt husband called an said that [facility] doesn't do labs  They are wanting to know what the other options are

## 2021-03-03 ENCOUNTER — Encounter: Payer: Self-pay | Admitting: Dermatology

## 2021-03-04 ENCOUNTER — Other Ambulatory Visit (INDEPENDENT_AMBULATORY_CARE_PROVIDER_SITE_OTHER): Payer: Medicare Other

## 2021-03-04 ENCOUNTER — Ambulatory Visit: Payer: Medicare Other | Admitting: Physical Therapy

## 2021-03-04 ENCOUNTER — Other Ambulatory Visit: Payer: Self-pay

## 2021-03-04 DIAGNOSIS — E7800 Pure hypercholesterolemia, unspecified: Secondary | ICD-10-CM | POA: Diagnosis not present

## 2021-03-04 DIAGNOSIS — I10 Essential (primary) hypertension: Secondary | ICD-10-CM | POA: Diagnosis not present

## 2021-03-04 DIAGNOSIS — H93299 Other abnormal auditory perceptions, unspecified ear: Secondary | ICD-10-CM | POA: Diagnosis not present

## 2021-03-04 DIAGNOSIS — H6123 Impacted cerumen, bilateral: Secondary | ICD-10-CM | POA: Diagnosis not present

## 2021-03-04 LAB — BASIC METABOLIC PANEL
BUN: 11 mg/dL (ref 6–23)
CO2: 25 mEq/L (ref 19–32)
Calcium: 9.8 mg/dL (ref 8.4–10.5)
Chloride: 103 mEq/L (ref 96–112)
Creatinine, Ser: 0.69 mg/dL (ref 0.40–1.20)
GFR: 87.8 mL/min (ref >60.00)
Glucose, Bld: 105 mg/dL — ABNORMAL HIGH (ref 70–99)
Potassium: 3.9 mEq/L (ref 3.5–5.1)
Sodium: 140 mEq/L (ref 135–145)

## 2021-03-04 LAB — HEPATIC FUNCTION PANEL
ALT: 17 U/L (ref 0–35)
AST: 17 U/L (ref 0–37)
Albumin: 4.6 g/dL (ref 3.5–5.2)
Alkaline Phosphatase: 96 U/L (ref 39–117)
Bilirubin, Direct: 0.1 mg/dL (ref 0.0–0.3)
Total Bilirubin: 0.4 mg/dL (ref 0.2–1.2)
Total Protein: 7 g/dL (ref 6.0–8.3)

## 2021-03-05 DIAGNOSIS — G35 Multiple sclerosis: Secondary | ICD-10-CM | POA: Diagnosis not present

## 2021-03-06 DIAGNOSIS — M21372 Foot drop, left foot: Secondary | ICD-10-CM | POA: Diagnosis not present

## 2021-03-06 DIAGNOSIS — G35 Multiple sclerosis: Secondary | ICD-10-CM | POA: Diagnosis not present

## 2021-03-06 DIAGNOSIS — I69351 Hemiplegia and hemiparesis following cerebral infarction affecting right dominant side: Secondary | ICD-10-CM | POA: Diagnosis not present

## 2021-03-06 DIAGNOSIS — I10 Essential (primary) hypertension: Secondary | ICD-10-CM | POA: Diagnosis not present

## 2021-03-06 DIAGNOSIS — E785 Hyperlipidemia, unspecified: Secondary | ICD-10-CM | POA: Diagnosis not present

## 2021-03-06 DIAGNOSIS — F482 Pseudobulbar affect: Secondary | ICD-10-CM | POA: Diagnosis not present

## 2021-03-07 DIAGNOSIS — G35 Multiple sclerosis: Secondary | ICD-10-CM | POA: Diagnosis not present

## 2021-03-08 DIAGNOSIS — F482 Pseudobulbar affect: Secondary | ICD-10-CM | POA: Diagnosis not present

## 2021-03-08 DIAGNOSIS — I69351 Hemiplegia and hemiparesis following cerebral infarction affecting right dominant side: Secondary | ICD-10-CM | POA: Diagnosis not present

## 2021-03-08 DIAGNOSIS — G35 Multiple sclerosis: Secondary | ICD-10-CM | POA: Diagnosis not present

## 2021-03-08 DIAGNOSIS — I10 Essential (primary) hypertension: Secondary | ICD-10-CM | POA: Diagnosis not present

## 2021-03-08 DIAGNOSIS — M21372 Foot drop, left foot: Secondary | ICD-10-CM | POA: Diagnosis not present

## 2021-03-08 DIAGNOSIS — E785 Hyperlipidemia, unspecified: Secondary | ICD-10-CM | POA: Diagnosis not present

## 2021-03-11 DIAGNOSIS — G35 Multiple sclerosis: Secondary | ICD-10-CM | POA: Diagnosis not present

## 2021-03-11 DIAGNOSIS — M21372 Foot drop, left foot: Secondary | ICD-10-CM | POA: Diagnosis not present

## 2021-03-11 DIAGNOSIS — E785 Hyperlipidemia, unspecified: Secondary | ICD-10-CM | POA: Diagnosis not present

## 2021-03-11 DIAGNOSIS — I10 Essential (primary) hypertension: Secondary | ICD-10-CM | POA: Diagnosis not present

## 2021-03-11 DIAGNOSIS — I69351 Hemiplegia and hemiparesis following cerebral infarction affecting right dominant side: Secondary | ICD-10-CM | POA: Diagnosis not present

## 2021-03-11 DIAGNOSIS — F482 Pseudobulbar affect: Secondary | ICD-10-CM | POA: Diagnosis not present

## 2021-03-14 DIAGNOSIS — I69351 Hemiplegia and hemiparesis following cerebral infarction affecting right dominant side: Secondary | ICD-10-CM | POA: Diagnosis not present

## 2021-03-14 DIAGNOSIS — F482 Pseudobulbar affect: Secondary | ICD-10-CM | POA: Diagnosis not present

## 2021-03-14 DIAGNOSIS — M21372 Foot drop, left foot: Secondary | ICD-10-CM | POA: Diagnosis not present

## 2021-03-14 DIAGNOSIS — E785 Hyperlipidemia, unspecified: Secondary | ICD-10-CM | POA: Diagnosis not present

## 2021-03-14 DIAGNOSIS — I10 Essential (primary) hypertension: Secondary | ICD-10-CM | POA: Diagnosis not present

## 2021-03-14 DIAGNOSIS — G35 Multiple sclerosis: Secondary | ICD-10-CM | POA: Diagnosis not present

## 2021-03-15 ENCOUNTER — Telehealth: Payer: Self-pay | Admitting: Internal Medicine

## 2021-03-15 DIAGNOSIS — M21372 Foot drop, left foot: Secondary | ICD-10-CM | POA: Diagnosis not present

## 2021-03-15 DIAGNOSIS — I10 Essential (primary) hypertension: Secondary | ICD-10-CM | POA: Diagnosis not present

## 2021-03-15 DIAGNOSIS — G35 Multiple sclerosis: Secondary | ICD-10-CM | POA: Diagnosis not present

## 2021-03-15 DIAGNOSIS — E785 Hyperlipidemia, unspecified: Secondary | ICD-10-CM | POA: Diagnosis not present

## 2021-03-15 DIAGNOSIS — I69351 Hemiplegia and hemiparesis following cerebral infarction affecting right dominant side: Secondary | ICD-10-CM | POA: Diagnosis not present

## 2021-03-15 DIAGNOSIS — F482 Pseudobulbar affect: Secondary | ICD-10-CM | POA: Diagnosis not present

## 2021-03-15 NOTE — Telephone Encounter (Signed)
Carly [name] from [facility] called from [phone] to advise of a little accident that has happened. PT is fine and vitals are fine. However the husband accidentally gave her 2 doses of baclofen (LIORESAL) 10 MG tablet that have made her a bit drowsy. She did advise PT that if she gets to feeling worse to contact us and they Husband is now aware of the correct dosage.

## 2021-03-16 ENCOUNTER — Other Ambulatory Visit: Payer: Self-pay | Admitting: Internal Medicine

## 2021-03-18 ENCOUNTER — Other Ambulatory Visit: Payer: Self-pay

## 2021-03-18 ENCOUNTER — Ambulatory Visit: Payer: Medicare Other | Attending: Physical Medicine and Rehabilitation | Admitting: Occupational Therapy

## 2021-03-18 DIAGNOSIS — I890 Lymphedema, not elsewhere classified: Secondary | ICD-10-CM | POA: Insufficient documentation

## 2021-03-18 NOTE — Telephone Encounter (Signed)
LM for Carly [name] and patient

## 2021-03-18 NOTE — Therapy (Signed)
[place] MAIN [facility] SERVICES [address] [place], [place], [address] Phone: [phone]   Fax:  [phone]  Patient Details  Name: Carly [name] MRN: [id number] Date of Birth: 1951-09-20 Referring Provider:  [name], MD   Pt was arrived , but I am unable to provide outpatient Occupational Therapy Evaluation today  due to Pt currently receiving home health services funded through Medicare Part A. Outpatient OT is reimbursed through Medicare part B, so evaluation would not be funded while Pt is receiving part A services and considered to be "homebound". Pt and spouse agree with plan to call to reschedule when home care services are complete.   Andrey Spearman, MS, OTR/L, CLT-LANA 03/18/21 12:12 PM   Ansel Bong 03/18/2021, 12:12 PM  [place] MA[facility] SERVICES [address] [place], [place], [address] Phone: [phone]   Fax:  [phone]

## 2021-03-19 ENCOUNTER — Telehealth: Payer: Self-pay | Admitting: Internal Medicine

## 2021-03-19 DIAGNOSIS — G35 Multiple sclerosis: Secondary | ICD-10-CM | POA: Diagnosis not present

## 2021-03-19 DIAGNOSIS — I69351 Hemiplegia and hemiparesis following cerebral infarction affecting right dominant side: Secondary | ICD-10-CM | POA: Diagnosis not present

## 2021-03-19 DIAGNOSIS — F482 Pseudobulbar affect: Secondary | ICD-10-CM | POA: Diagnosis not present

## 2021-03-19 DIAGNOSIS — I10 Essential (primary) hypertension: Secondary | ICD-10-CM | POA: Diagnosis not present

## 2021-03-19 DIAGNOSIS — E785 Hyperlipidemia, unspecified: Secondary | ICD-10-CM | POA: Diagnosis not present

## 2021-03-19 DIAGNOSIS — M21372 Foot drop, left foot: Secondary | ICD-10-CM | POA: Diagnosis not present

## 2021-03-19 NOTE — Telephone Encounter (Signed)
[facility] nurse called and requested to speak directly with me. Pt was considered high risk for COVID complaining of headache x 4-5 days and fatigue. Spoke directly with patient she is having head ache, fatigue, mild cough- non productive, hoarseness, worse in the morning, nasal congestion, facial pressure. No chest tightness, SOB, fever, etc. Using sudafed PE, flonase and astelin. Used warm compresses on her face yesterday to relieve some of the pressure- was effective. Pt has not been tested for covid. Husband is not sick and they have not been around anyone that is sick. Patient is requesting abx. Advised that we need to confirm that she is COVID negative and we will have to do virtual in order to determine need for abx. Advised that Dr Nicki Reaper is out of office. Pt agreed to see Sharyn Lull. Spoke with Sharyn Lull to see what kind of OTC meds she could use until appt. Pt is going to stop sudafed PE and try coricidin HBP, continue flonase, and use saline nasal rinse. Patients husband is going to drug store to get medication and covid test. Will call with results. They declined coming in to be tested at our office. Virtual scheduled 6/8 @ 3:00

## 2021-03-19 NOTE — Telephone Encounter (Signed)
Access Nurse Documentation   

## 2021-03-19 NOTE — Telephone Encounter (Signed)
Her COVID swab was negative. I sent you another message on her

## 2021-03-19 NOTE — Telephone Encounter (Signed)
Spoke with patient and husband. Does not want to come have covid test here because it is hard for her to get in and out of the house. Patient will go to urgent care is symptoms worsen. She has virtual appt tomorrow.

## 2021-03-19 NOTE — Telephone Encounter (Signed)
Spoke with patient this morning, she is doing ok with this but she thinks she has a sinus infection. Has appt with Sharyn Lull tomorrow virtually.

## 2021-03-19 NOTE — Telephone Encounter (Signed)
Patient called complaining of a really bad headache, no other symptoms.She requested to speak to a triage nurse. No appointments available. Patient was transferred to [facility] at [facility].

## 2021-03-19 NOTE — Telephone Encounter (Signed)
Noted will look at other message.

## 2021-03-19 NOTE — Telephone Encounter (Signed)
PT called to advise that she tested negative for covid and was advise to tell [place].

## 2021-03-19 NOTE — Telephone Encounter (Signed)
Would advise her to do covid/ flu/ rsv test here parking lot testing if her negative was a rapid.  If she continues to feel very sleepy or having any worsening symptoms would advise her to be seen in [facility] today for an in person evaluation.

## 2021-03-20 ENCOUNTER — Telehealth (INDEPENDENT_AMBULATORY_CARE_PROVIDER_SITE_OTHER): Payer: Medicare Other | Admitting: Adult Health

## 2021-03-20 DIAGNOSIS — G35 Multiple sclerosis: Secondary | ICD-10-CM | POA: Diagnosis not present

## 2021-03-20 DIAGNOSIS — I69351 Hemiplegia and hemiparesis following cerebral infarction affecting right dominant side: Secondary | ICD-10-CM | POA: Diagnosis not present

## 2021-03-20 DIAGNOSIS — Z5329 Procedure and treatment not carried out because of patient's decision for other reasons: Secondary | ICD-10-CM

## 2021-03-20 DIAGNOSIS — M21372 Foot drop, left foot: Secondary | ICD-10-CM | POA: Diagnosis not present

## 2021-03-20 DIAGNOSIS — F482 Pseudobulbar affect: Secondary | ICD-10-CM | POA: Diagnosis not present

## 2021-03-20 DIAGNOSIS — E785 Hyperlipidemia, unspecified: Secondary | ICD-10-CM | POA: Diagnosis not present

## 2021-03-20 DIAGNOSIS — I10 Essential (primary) hypertension: Secondary | ICD-10-CM | POA: Diagnosis not present

## 2021-03-21 ENCOUNTER — Encounter: Payer: Medicare Other | Admitting: Occupational Therapy

## 2021-03-21 NOTE — Progress Notes (Signed)
No provider contact patient declined viist she was feeling better she told Leta Speller CMA. This was at time of visit.

## 2021-03-22 DIAGNOSIS — E785 Hyperlipidemia, unspecified: Secondary | ICD-10-CM | POA: Diagnosis not present

## 2021-03-22 DIAGNOSIS — M21372 Foot drop, left foot: Secondary | ICD-10-CM | POA: Diagnosis not present

## 2021-03-22 DIAGNOSIS — I10 Essential (primary) hypertension: Secondary | ICD-10-CM | POA: Diagnosis not present

## 2021-03-22 DIAGNOSIS — I69351 Hemiplegia and hemiparesis following cerebral infarction affecting right dominant side: Secondary | ICD-10-CM | POA: Diagnosis not present

## 2021-03-22 DIAGNOSIS — F482 Pseudobulbar affect: Secondary | ICD-10-CM | POA: Diagnosis not present

## 2021-03-22 DIAGNOSIS — G35 Multiple sclerosis: Secondary | ICD-10-CM | POA: Diagnosis not present

## 2021-03-26 ENCOUNTER — Encounter: Payer: Medicare Other | Admitting: Occupational Therapy

## 2021-03-27 ENCOUNTER — Telehealth: Payer: Self-pay

## 2021-03-27 ENCOUNTER — Telehealth: Payer: Self-pay | Admitting: Internal Medicine

## 2021-03-27 DIAGNOSIS — G35 Multiple sclerosis: Secondary | ICD-10-CM | POA: Diagnosis not present

## 2021-03-27 DIAGNOSIS — F482 Pseudobulbar affect: Secondary | ICD-10-CM | POA: Diagnosis not present

## 2021-03-27 DIAGNOSIS — I69351 Hemiplegia and hemiparesis following cerebral infarction affecting right dominant side: Secondary | ICD-10-CM | POA: Diagnosis not present

## 2021-03-27 DIAGNOSIS — E785 Hyperlipidemia, unspecified: Secondary | ICD-10-CM | POA: Diagnosis not present

## 2021-03-27 DIAGNOSIS — I10 Essential (primary) hypertension: Secondary | ICD-10-CM | POA: Diagnosis not present

## 2021-03-27 DIAGNOSIS — M21372 Foot drop, left foot: Secondary | ICD-10-CM | POA: Diagnosis not present

## 2021-03-27 NOTE — Telephone Encounter (Signed)
Noted. Will hold to receive.

## 2021-03-27 NOTE — Telephone Encounter (Signed)
Erline Levine, physical Therapist from [facility], [phone]. She will be emailing and faxing orders for this patient.

## 2021-03-27 NOTE — Telephone Encounter (Signed)
Pt's husband dropped off blood pressure readings. Placed in folder up frnt

## 2021-03-28 ENCOUNTER — Telehealth (INDEPENDENT_AMBULATORY_CARE_PROVIDER_SITE_OTHER): Payer: Medicare Other | Admitting: Internal Medicine

## 2021-03-28 ENCOUNTER — Encounter: Payer: Medicare Other | Admitting: Occupational Therapy

## 2021-03-28 ENCOUNTER — Encounter: Payer: Self-pay | Admitting: Internal Medicine

## 2021-03-28 DIAGNOSIS — N83201 Unspecified ovarian cyst, right side: Secondary | ICD-10-CM | POA: Diagnosis not present

## 2021-03-28 DIAGNOSIS — R739 Hyperglycemia, unspecified: Secondary | ICD-10-CM | POA: Diagnosis not present

## 2021-03-28 DIAGNOSIS — M5136 Other intervertebral disc degeneration, lumbar region: Secondary | ICD-10-CM | POA: Diagnosis not present

## 2021-03-28 DIAGNOSIS — E669 Obesity, unspecified: Secondary | ICD-10-CM | POA: Diagnosis not present

## 2021-03-28 DIAGNOSIS — G43909 Migraine, unspecified, not intractable, without status migrainosus: Secondary | ICD-10-CM | POA: Diagnosis not present

## 2021-03-28 DIAGNOSIS — F411 Generalized anxiety disorder: Secondary | ICD-10-CM | POA: Diagnosis not present

## 2021-03-28 DIAGNOSIS — E7800 Pure hypercholesterolemia, unspecified: Secondary | ICD-10-CM

## 2021-03-28 DIAGNOSIS — I10 Essential (primary) hypertension: Secondary | ICD-10-CM | POA: Diagnosis not present

## 2021-03-28 DIAGNOSIS — F482 Pseudobulbar affect: Secondary | ICD-10-CM | POA: Diagnosis not present

## 2021-03-28 DIAGNOSIS — Z8673 Personal history of transient ischemic attack (TIA), and cerebral infarction without residual deficits: Secondary | ICD-10-CM | POA: Diagnosis not present

## 2021-03-28 DIAGNOSIS — M21372 Foot drop, left foot: Secondary | ICD-10-CM | POA: Diagnosis not present

## 2021-03-28 DIAGNOSIS — F339 Major depressive disorder, recurrent, unspecified: Secondary | ICD-10-CM

## 2021-03-28 DIAGNOSIS — F32A Depression, unspecified: Secondary | ICD-10-CM | POA: Diagnosis not present

## 2021-03-28 DIAGNOSIS — K219 Gastro-esophageal reflux disease without esophagitis: Secondary | ICD-10-CM | POA: Diagnosis not present

## 2021-03-28 DIAGNOSIS — Z6831 Body mass index (BMI) 31.0-31.9, adult: Secondary | ICD-10-CM | POA: Diagnosis not present

## 2021-03-28 DIAGNOSIS — J302 Other seasonal allergic rhinitis: Secondary | ICD-10-CM | POA: Diagnosis not present

## 2021-03-28 DIAGNOSIS — E785 Hyperlipidemia, unspecified: Secondary | ICD-10-CM | POA: Diagnosis not present

## 2021-03-28 DIAGNOSIS — I69351 Hemiplegia and hemiparesis following cerebral infarction affecting right dominant side: Secondary | ICD-10-CM | POA: Diagnosis not present

## 2021-03-28 DIAGNOSIS — I639 Cerebral infarction, unspecified: Secondary | ICD-10-CM

## 2021-03-28 DIAGNOSIS — G8929 Other chronic pain: Secondary | ICD-10-CM | POA: Diagnosis not present

## 2021-03-28 DIAGNOSIS — N83202 Unspecified ovarian cyst, left side: Secondary | ICD-10-CM | POA: Diagnosis not present

## 2021-03-28 DIAGNOSIS — E559 Vitamin D deficiency, unspecified: Secondary | ICD-10-CM | POA: Diagnosis not present

## 2021-03-28 DIAGNOSIS — Z8619 Personal history of other infectious and parasitic diseases: Secondary | ICD-10-CM | POA: Diagnosis not present

## 2021-03-28 DIAGNOSIS — G35 Multiple sclerosis: Secondary | ICD-10-CM | POA: Diagnosis not present

## 2021-03-28 DIAGNOSIS — K5900 Constipation, unspecified: Secondary | ICD-10-CM | POA: Diagnosis not present

## 2021-03-28 DIAGNOSIS — Z8701 Personal history of pneumonia (recurrent): Secondary | ICD-10-CM | POA: Diagnosis not present

## 2021-03-28 DIAGNOSIS — Z86010 Personal history of colonic polyps: Secondary | ICD-10-CM | POA: Diagnosis not present

## 2021-03-28 DIAGNOSIS — M899 Disorder of bone, unspecified: Secondary | ICD-10-CM | POA: Diagnosis not present

## 2021-03-28 DIAGNOSIS — M949 Disorder of cartilage, unspecified: Secondary | ICD-10-CM | POA: Diagnosis not present

## 2021-03-28 MED ORDER — ROSUVASTATIN CALCIUM 5 MG PO TABS: 5.0000 mg | ORAL_TABLET | Freq: Every day | ORAL | 1 refills | Status: DC

## 2021-03-28 NOTE — Telephone Encounter (Signed)
Holding for appointment

## 2021-03-28 NOTE — Progress Notes (Signed)
Patient ID: Carly [name], female   DOB: [birth year], 70 y.o.   MRN: [id number]   Virtual Visit via video Note  This visit type was conducted due to national recommendations for restrictions regarding the COVID-19 pandemic (e.g. social distancing).  This format is felt to be most appropriate for this patient at this time.  All issues noted in this document were discussed and addressed.  No physical exam was performed (except for noted visual exam findings with Video Visits).   I connected with [name] by a video enabled telemedicine application or telephone and verified that I am speaking with the correct person using two identifiers. Location patient: home Location provider: work Persons participating in the virtual visit: patient, provider  The limitations, risks, security and privacy concerns of performing an evaluation and management service by video and the availability of in person appointments have been discussed.  It has also been discussed with the patient that there may be a patient responsible charge related to this service. The patient expressed understanding and agreed to proceed.   Reason for visit:  follow up appt.    HPI: Follow-up regarding her blood pressure, cholesterol and her recent stroke.  She is taking and tolerating amlodipine and losartan.  Has had recent problems with leg stiffness.  Taking baclofen now.  Helping.  Discussed tolerance.  Being followed for lymphedema.  No chest pain.  Breathing stable.  Increased stress related to above and recent stroke.  Discussed.  Also discussed cholesterol medication and tolerance.  Discussed changing to 28m q day since tolerating.     ROS: See pertinent positives and negatives per HPI.  Past Medical History:  Diagnosis Date   Allergy    Anxiety    Aspiration pneumonia (HCC)    Depression    Frequent headaches    H/O   GERD (gastroesophageal reflux disease)    History of chicken pox    History of colon  polyps    Hx of migraines    Multiple sclerosis (HCC)    PONV (postoperative nausea and vomiting)     Past Surgical History:  Procedure Laterality Date   BACK SURGERY     CHOLECYSTECTOMY     COLONOSCOPY WITH PROPOFOL N/A 05/18/2017   Procedure: COLONOSCOPY WITH PROPOFOL;  Surgeon: EManya Silvas MD;  [facility]  Service: Endoscopy;  Laterality: N/A;   FOOT SURGERY  2015   GALLBLADDER SURGERY  2008   HARDWARE REMOVAL Left 02/14/2016   Procedure: LEFT FOOT REMOVAL DEEP IMPLANT;  Surgeon: JWylene Simmer MD;  [facility]  Service: Orthopedics;  Laterality: Left;   HERNIA REPAIR     Inguinal Hernia Repair   SPINE SURGERY  2014    Family History  Problem Relation Age of Onset   Arthritis Mother    Hypertension Mother    Macular degeneration Mother    Hypertension Father    Hyperlipidemia Father    Heart disease Maternal Grandfather    Diabetes Maternal Grandfather    Kidney disease Paternal Grandmother     SOCIAL HX: reviewed.    Current Outpatient Medications:    acetaminophen (TYLENOL) 500 MG tablet, Take 1 tablet (500 mg total) by mouth every 4 (four) hours as needed., Disp: 30 tablet, Rfl: 0   baclofen (LIORESAL) 10 MG tablet, Take 1 tablet (10 mg total) by mouth at bedtime., Disp: 30 each, Rfl: 3   Cholecalciferol (D3-1000 PO), Take 1,000 Units by mouth daily., Disp: , Rfl:  Coenzyme Q10 (CO Q10) 100 MG CAPS, Take 1 capsule by mouth daily., Disp: , Rfl:    dalfampridine 10 MG TB12, Take 10 mg by mouth every 12 (twelve) hours., Disp: , Rfl:    docusate sodium (COLACE) 100 MG capsule, Take 100 mg by mouth 2 (two) times daily., Disp: , Rfl:    DULoxetine (CYMBALTA) 60 MG capsule, Take 1 capsule (60 mg total) by mouth daily., Disp: 30 capsule, Rfl: 0   famotidine (PEPCID) 20 MG tablet, Take 1 tablet (20 mg total) by mouth 2 (two) times daily., Disp: 60 tablet, Rfl: 1   fexofenadine (ALLEGRA ALLERGY) 180 MG tablet, Take 1 tablet (180 mg  total) by mouth daily., Disp: 30 tablet, Rfl: 2   fluticasone (FLONASE) 50 MCG/ACT nasal spray, Place 1 spray into both nostrils daily., Disp: 16 g, Rfl: 2   furosemide (LASIX) 40 MG tablet, Take 1 tablet (40 mg total) by mouth daily., Disp: 30 tablet, Rfl: 0   ketoconazole (NIZORAL) 2 % cream, SMARTSIG:1 Application Topical Morning-Night, Disp: , Rfl:    losartan (COZAAR) 25 MG tablet, Take 1 tablet (25 mg total) by mouth daily., Disp: 30 tablet, Rfl: 2   magnesium oxide (MAG-OX) 400 MG tablet, TAKE ONE TABLET EVERY DAY, Disp: 30 tablet, Rfl: 1   Magnesium Oxide 400 (240 Mg) MG TABS, TAKE ONE TABLET EVERY DAY, Disp: 30 tablet, Rfl: 1   METAMUCIL FIBER PO, Take by mouth., Disp: , Rfl:    mirtazapine (REMERON) 30 MG tablet, Take 30 mg by mouth at bedtime., Disp: , Rfl:    nystatin cream (MYCOSTATIN), Apply 1 application topically 2 (two) times daily., Disp: 30 g, Rfl: 0   QUEtiapine (SEROQUEL) 25 MG tablet, Take 1/2 pill in morning and a whole pill at bedtime., Disp: 45 tablet, Rfl: 0   saccharomyces boulardii (FLORASTOR) 250 MG capsule, Take 1 capsule (250 mg total) by mouth 2 (two) times daily., Disp: 60 capsule, Rfl: 0   sodium chloride (OCEAN) 0.65 % nasal spray, Place 1-2 sprays into the nose as needed., Disp: , Rfl:    amLODipine (NORVASC) 5 MG tablet, TAKE 1 TABLET BY MOUTH DAILY., Disp: 90 tablet, Rfl: 1   clopidogrel (PLAVIX) 75 MG tablet, TAKE 1 TABLET (75 MG) BY MOUTH EVERY DAY, Disp: 30 tablet, Rfl: 0   rosuvastatin (CRESTOR) 5 MG tablet, Take 1 tablet (5 mg total) by mouth daily., Disp: 90 tablet, Rfl: 1 No current facility-administered medications for this visit.  Facility-Administered Medications Ordered in Other Visits:    0.9 %  sodium chloride infusion, , Intravenous, Continuous, Corcoran, Melissa C, MD, Last Rate: 10 mL/hr at 04/25/16 0855, New Bag at 04/25/16 0855   acetaminophen (TYLENOL) tablet 650 mg, 650 mg, Oral, Once, Corcoran, Drue Second, MD  EXAM:  GENERAL: alert,  oriented, appears well and in no acute distress  HEENT: atraumatic, conjunttiva clear, no obvious abnormalities on inspection of external nose and ears  NECK: normal movements of the head and neck  LUNGS: on inspection no signs of respiratory distress, breathing rate appears normal, no obvious gross SOB, gasping or wheezing  CV: no obvious cyanosis  PSYCH/NEURO: pleasant and cooperative, no obvious depression or anxiety, speech and thought processing grossly intact  ASSESSMENT AND PLAN:  Discussed the following assessment and plan:  Problem List Items Addressed This Visit     Depression, recurrent ([place])    Followed by psychiatry.  Continues on seroquel and remeron.  Follow.         History  of CVA (cerebrovascular accident)    Increased stress related to recent cva. Doing well.  Tolerating cholesterol medication.  Increase to q day.  Continue current blood pressure medication.  Follow.        Hypercholesterolemia    Tolerating current dose of crestor.  Increase to q day.  Follow lipid panel and liver function tests.         Hyperglycemia    Low carb diet and exercise.  Follow met b and a1c.        Hypertension    Tolerating losartan and amlodipine.  Blood pressure improved.  Continue current medication regimen.  Follow pressures.  Follow metabolic panel.        Multiple sclerosis ([place])    Continue f/u and treatment per neurology.         Return in about 2 weeks (around 04/11/2021) for follow up appt (59mn).   I discussed the assessment and treatment plan with the patient. The patient was provided an opportunity to ask questions and all were answered. The patient agreed with the plan and demonstrated an understanding of the instructions.   The patient was advised to call back or seek an in-person evaluation if the symptoms worsen or if the condition fails to improve as anticipated.    CEinar Pheasant MD

## 2021-04-01 ENCOUNTER — Telehealth: Payer: Self-pay | Admitting: Internal Medicine

## 2021-04-01 ENCOUNTER — Encounter: Payer: Medicare Other | Admitting: Occupational Therapy

## 2021-04-01 ENCOUNTER — Other Ambulatory Visit: Payer: Self-pay

## 2021-04-01 MED ORDER — ROSUVASTATIN CALCIUM 5 MG PO TABS: 5.0000 mg | ORAL_TABLET | Freq: Every day | ORAL | 1 refills | Status: DC

## 2021-04-01 NOTE — Telephone Encounter (Signed)
PT called to advise that she had talked to Dr.Scott about being seen in 2 weeks from now. Advise saw nothing available and advise would send a message back to see what Dr.Scott could do in seeing her in 2 weeks.

## 2021-04-02 DIAGNOSIS — M79601 Pain in right arm: Secondary | ICD-10-CM | POA: Diagnosis not present

## 2021-04-02 DIAGNOSIS — M5412 Radiculopathy, cervical region: Secondary | ICD-10-CM | POA: Diagnosis not present

## 2021-04-02 DIAGNOSIS — M5413 Radiculopathy, cervicothoracic region: Secondary | ICD-10-CM | POA: Diagnosis not present

## 2021-04-02 DIAGNOSIS — M79602 Pain in left arm: Secondary | ICD-10-CM | POA: Diagnosis not present

## 2021-04-02 DIAGNOSIS — M9900 Segmental and somatic dysfunction of head region: Secondary | ICD-10-CM | POA: Diagnosis not present

## 2021-04-02 DIAGNOSIS — M7918 Myalgia, other site: Secondary | ICD-10-CM | POA: Diagnosis not present

## 2021-04-02 DIAGNOSIS — M9901 Segmental and somatic dysfunction of cervical region: Secondary | ICD-10-CM | POA: Diagnosis not present

## 2021-04-02 DIAGNOSIS — M542 Cervicalgia: Secondary | ICD-10-CM | POA: Diagnosis not present

## 2021-04-02 DIAGNOSIS — R519 Headache, unspecified: Secondary | ICD-10-CM | POA: Diagnosis not present

## 2021-04-02 NOTE — Telephone Encounter (Signed)
I have a meeting from 11-12 on 04/10/21.  Can she do 12:00 on 04/10/21.

## 2021-04-02 NOTE — Telephone Encounter (Signed)
Would you like to see patient in two weeks? If so when & I can get patient scheduled?

## 2021-04-02 NOTE — Telephone Encounter (Signed)
LMTCB

## 2021-04-03 ENCOUNTER — Other Ambulatory Visit: Payer: Self-pay | Admitting: Internal Medicine

## 2021-04-03 ENCOUNTER — Encounter: Payer: Medicare Other | Admitting: Occupational Therapy

## 2021-04-03 DIAGNOSIS — E785 Hyperlipidemia, unspecified: Secondary | ICD-10-CM | POA: Diagnosis not present

## 2021-04-03 DIAGNOSIS — M21372 Foot drop, left foot: Secondary | ICD-10-CM | POA: Diagnosis not present

## 2021-04-03 DIAGNOSIS — F482 Pseudobulbar affect: Secondary | ICD-10-CM | POA: Diagnosis not present

## 2021-04-03 DIAGNOSIS — I10 Essential (primary) hypertension: Secondary | ICD-10-CM | POA: Diagnosis not present

## 2021-04-03 DIAGNOSIS — G35 Multiple sclerosis: Secondary | ICD-10-CM | POA: Diagnosis not present

## 2021-04-03 DIAGNOSIS — I69351 Hemiplegia and hemiparesis following cerebral infarction affecting right dominant side: Secondary | ICD-10-CM | POA: Diagnosis not present

## 2021-04-03 NOTE — Telephone Encounter (Signed)
6/29 appt has been used. I have scheduled patient for virtual on 6/27 at 4:00. Patient is aware and agreeable to appointment

## 2021-04-05 DIAGNOSIS — E785 Hyperlipidemia, unspecified: Secondary | ICD-10-CM | POA: Diagnosis not present

## 2021-04-05 DIAGNOSIS — I10 Essential (primary) hypertension: Secondary | ICD-10-CM | POA: Diagnosis not present

## 2021-04-05 DIAGNOSIS — I69351 Hemiplegia and hemiparesis following cerebral infarction affecting right dominant side: Secondary | ICD-10-CM | POA: Diagnosis not present

## 2021-04-05 DIAGNOSIS — G35 Multiple sclerosis: Secondary | ICD-10-CM | POA: Diagnosis not present

## 2021-04-05 DIAGNOSIS — F482 Pseudobulbar affect: Secondary | ICD-10-CM | POA: Diagnosis not present

## 2021-04-05 DIAGNOSIS — M21372 Foot drop, left foot: Secondary | ICD-10-CM | POA: Diagnosis not present

## 2021-04-06 ENCOUNTER — Other Ambulatory Visit: Payer: Self-pay | Admitting: Internal Medicine

## 2021-04-07 NOTE — Assessment & Plan Note (Signed)
Increased stress related to recent cva. Doing well.  Tolerating cholesterol medication.  Increase to q day.  Continue current blood pressure medication.  Follow.

## 2021-04-07 NOTE — Assessment & Plan Note (Signed)
Tolerating losartan and amlodipine.  Blood pressure improved.  Continue current medication regimen.  Follow pressures.  Follow metabolic panel.

## 2021-04-07 NOTE — Assessment & Plan Note (Signed)
Tolerating current dose of crestor.  Increase to q day.  Follow lipid panel and liver function tests.

## 2021-04-07 NOTE — Assessment & Plan Note (Signed)
Continue f/u and treatment per neurology.

## 2021-04-07 NOTE — Assessment & Plan Note (Signed)
Low carb diet and exercise.  Follow met b and a1c.  

## 2021-04-07 NOTE — Assessment & Plan Note (Signed)
Followed by psychiatry.  Continues on seroquel and remeron.  Follow.

## 2021-04-08 ENCOUNTER — Encounter: Payer: Medicare Other | Admitting: Occupational Therapy

## 2021-04-08 ENCOUNTER — Telehealth (INDEPENDENT_AMBULATORY_CARE_PROVIDER_SITE_OTHER): Payer: Medicare Other | Admitting: Internal Medicine

## 2021-04-08 DIAGNOSIS — R519 Headache, unspecified: Secondary | ICD-10-CM | POA: Diagnosis not present

## 2021-04-08 DIAGNOSIS — R739 Hyperglycemia, unspecified: Secondary | ICD-10-CM

## 2021-04-08 DIAGNOSIS — M9900 Segmental and somatic dysfunction of head region: Secondary | ICD-10-CM | POA: Diagnosis not present

## 2021-04-08 DIAGNOSIS — K635 Polyp of colon: Secondary | ICD-10-CM | POA: Diagnosis not present

## 2021-04-08 DIAGNOSIS — I639 Cerebral infarction, unspecified: Secondary | ICD-10-CM | POA: Diagnosis not present

## 2021-04-08 DIAGNOSIS — M79602 Pain in left arm: Secondary | ICD-10-CM | POA: Diagnosis not present

## 2021-04-08 DIAGNOSIS — M9901 Segmental and somatic dysfunction of cervical region: Secondary | ICD-10-CM | POA: Diagnosis not present

## 2021-04-08 DIAGNOSIS — M7918 Myalgia, other site: Secondary | ICD-10-CM | POA: Diagnosis not present

## 2021-04-08 DIAGNOSIS — F339 Major depressive disorder, recurrent, unspecified: Secondary | ICD-10-CM | POA: Diagnosis not present

## 2021-04-08 DIAGNOSIS — E7800 Pure hypercholesterolemia, unspecified: Secondary | ICD-10-CM

## 2021-04-08 DIAGNOSIS — G35 Multiple sclerosis: Secondary | ICD-10-CM

## 2021-04-08 DIAGNOSIS — Z8673 Personal history of transient ischemic attack (TIA), and cerebral infarction without residual deficits: Secondary | ICD-10-CM | POA: Diagnosis not present

## 2021-04-08 DIAGNOSIS — M5412 Radiculopathy, cervical region: Secondary | ICD-10-CM | POA: Diagnosis not present

## 2021-04-08 DIAGNOSIS — M542 Cervicalgia: Secondary | ICD-10-CM | POA: Diagnosis not present

## 2021-04-08 DIAGNOSIS — I10 Essential (primary) hypertension: Secondary | ICD-10-CM | POA: Diagnosis not present

## 2021-04-08 DIAGNOSIS — R600 Localized edema: Secondary | ICD-10-CM | POA: Diagnosis not present

## 2021-04-08 DIAGNOSIS — M79601 Pain in right arm: Secondary | ICD-10-CM | POA: Diagnosis not present

## 2021-04-08 DIAGNOSIS — M5413 Radiculopathy, cervicothoracic region: Secondary | ICD-10-CM | POA: Diagnosis not present

## 2021-04-08 NOTE — Progress Notes (Signed)
Patient ID: Carly [name], female   DOB: 09/11/1951, 70 y.o.   MRN: [id number]   Virtual Visit via video Note  This visit type was conducted due to national recommendations for restrictions regarding the COVID-19 pandemic (e.g. social distancing).  This format is felt to be most appropriate for this patient at this time.  All issues noted in this document were discussed and addressed.  No physical exam was performed (except for noted visual exam findings with Video Visits).   I connected with Carly [name] by a video enabled telemedicine application and verified that I am speaking with the correct person using two identifiers. Location patient: home Location provider: work or Persons participating in the virtual visit: patient, provider and pt husband  The limitations, risks, security and privacy concerns of performing an evaluation and management service by video and the availability of in person appointments have been discussed.  It has also been discussed with the patient that there may be a patient responsible charge related to this service. The patient expressed understanding and agreed to proceed.   Reason for visit: follow up appt  HPI: Follow up regarding her blood pressure.  Also increased stress related to recent CVA.  Has good support - husband.  Is requesting any information about a stroke support group.  She is taking seroquel.  This has helped her sleep.  Dr Pamalee Leyden increased the seroquel to 50mg  q hs.  She was questioning if she could add a dose of seroquel (or another medication) - in the am to help control her stress and anxiety.  No chest pain or sob reported.  No abdominal pain.  Bowels appear to be doing better.  Had questions about her colonoscopy.  Discussed would postpone for now given CVA less than 6 months ago.  Blood pressure today 131/92.  Blood pressure averaging mostly upper 130s/upper80's.  Discussed plans to possibly move to the [facility] or [place].      ROS: See pertinent positives and negatives per HPI.  Past Medical History:  Diagnosis Date   Allergy    Anxiety    Aspiration pneumonia (HCC)    Depression    Frequent headaches    H/O   GERD (gastroesophageal reflux disease)    History of chicken pox    History of colon polyps    Hx of migraines    Multiple sclerosis (HCC)    PONV (postoperative nausea and vomiting)     Past Surgical History:  Procedure Laterality Date   BACK SURGERY     CHOLECYSTECTOMY     COLONOSCOPY WITH PROPOFOL N/A 05/18/2017   Procedure: COLONOSCOPY WITH PROPOFOL;  Surgeon: [name], MD;  [facility];  Service: Endoscopy;  Laterality: N/A;   FOOT SURGERY  2015   GALLBLADDER SURGERY  2008   HARDWARE REMOVAL Left 02/14/2016   Procedure: LEFT FOOT REMOVAL DEEP IMPLANT;  Surgeon: [name], MD;  Location: [place];  Service: Orthopedics;  Laterality: Left;   HERNIA REPAIR     Inguinal Hernia Repair   SPINE SURGERY  2014    Family History  Problem Relation Age of Onset   Arthritis Mother    Hypertension Mother    Macular degeneration Mother    Hypertension Father    Hyperlipidemia Father    Heart disease Maternal Grandfather    Diabetes Maternal Grandfather    Kidney disease Paternal Grandmother     SOCIAL HX: reviewed.    Current Outpatient Medications:  losartan (COZAAR) 50 MG tablet, Take 1 tablet (50 mg total) by mouth daily., Disp: 90 tablet, Rfl: 1   omeprazole (PRILOSEC) 20 MG capsule, Take 20 mg by mouth 2 (two) times daily before a meal., Disp: , Rfl:    acetaminophen (TYLENOL) 500 MG tablet, Take 1 tablet (500 mg total) by mouth every 4 (four) hours as needed., Disp: 30 tablet, Rfl: 0   amLODipine (NORVASC) 5 MG tablet, TAKE 1 TABLET BY MOUTH DAILY., Disp: 90 tablet, Rfl: 1   baclofen (LIORESAL) 10 MG tablet, Take 1 tablet (10 mg total) by mouth at bedtime. (Patient taking differently: Take 5 mg by mouth 3 (three) times daily.), Disp: 30  each, Rfl: 3   Cholecalciferol (D3-1000 PO), Take 2,000 Units by mouth daily., Disp: , Rfl:    clopidogrel (PLAVIX) 75 MG tablet, TAKE 1 TABLET (75 MG) BY MOUTH EVERY DAY, Disp: 30 tablet, Rfl: 0   Coenzyme Q10 (CO Q10) 100 MG CAPS, Take 1 capsule by mouth daily., Disp: , Rfl:    dalfampridine 10 MG TB12, Take 10 mg by mouth every 12 (twelve) hours., Disp: , Rfl:    docusate sodium (COLACE) 100 MG capsule, Take 100 mg by mouth 2 (two) times daily., Disp: , Rfl:    DULoxetine (CYMBALTA) 60 MG capsule, Take 1 capsule (60 mg total) by mouth daily., Disp: 30 capsule, Rfl: 0   fexofenadine (ALLEGRA ALLERGY) 180 MG tablet, Take 1 tablet (180 mg total) by mouth daily., Disp: 30 tablet, Rfl: 2   fluticasone (FLONASE) 50 MCG/ACT nasal spray, Place 1 spray into both nostrils daily., Disp: 16 g, Rfl: 2   furosemide (LASIX) 40 MG tablet, Take 1 tablet (40 mg total) by mouth daily., Disp: 30 tablet, Rfl: 0   ketoconazole (NIZORAL) 2 % cream, SMARTSIG:1 Application Topical Morning-Night, Disp: , Rfl:    magnesium oxide (MAG-OX) 400 MG tablet, TAKE 1 TABLET BY MOUTH DAILY, Disp: 30 tablet, Rfl: 5   METAMUCIL FIBER PO, Take by mouth 2 (two) times daily., Disp: , Rfl:    mirtazapine (REMERON) 30 MG tablet, Take 30 mg by mouth at bedtime., Disp: , Rfl:    nystatin cream (MYCOSTATIN), Apply 1 application topically 2 (two) times daily., Disp: 30 g, Rfl: 0   QUEtiapine (SEROQUEL) 25 MG tablet, Take 1/2 pill in morning and a whole pill at bedtime., Disp: 45 tablet, Rfl: 0   rosuvastatin (CRESTOR) 5 MG tablet, Take 1 tablet (5 mg total) by mouth daily., Disp: 90 tablet, Rfl: 1   saccharomyces boulardii (FLORASTOR) 250 MG capsule, Take 1 capsule (250 mg total) by mouth 2 (two) times daily., Disp: 60 capsule, Rfl: 0   sodium chloride (OCEAN) 0.65 % nasal spray, Place 1-2 sprays into the nose as needed., Disp: , Rfl:  No current facility-administered medications for this visit.  Facility-Administered Medications Ordered  in Other Visits:    0.9 %  sodium chloride infusion, , Intravenous, Continuous, Corcoran, Melissa C, MD, Last Rate: 10 mL/hr at 04/25/16 0855, New Bag at 04/25/16 0855   acetaminophen (TYLENOL) tablet 650 mg, 650 mg, Oral, Once, Corcoran, Melissa C, MD  EXAM:  GENERAL: alert, oriented, appears well and in no acute distress  HEENT: atraumatic, conjunttiva clear, no obvious abnormalities on inspection of external nose and ears  NECK: normal movements of the head and neck  LUNGS: on inspection no signs of respiratory distress, breathing rate appears normal, no obvious gross SOB, gasping or wheezing  CV: no obvious cyanosis  PSYCH/NEURO: pleasant and  cooperative, no obvious depression or anxiety, speech and thought processing grossly intact  ASSESSMENT AND PLAN:  Discussed the following assessment and plan:  Problem List Items Addressed This Visit     Colon polyp    Had colonoscopy August 2018.  Recommended follow-up colonoscopy in August 2021.  She is overdue.  Discussed today.  Given that she had a stroke less than 6 months ago will need to postpone for now.  Plan to follow-up with colonoscopy in the future.  She is currently not having any GI symptoms.       Depression, recurrent ([place])    She is seeing a therapist.  Seroquel dose recently increased by Dr. Pamalee Leyden.  She was questioning adding a small dose in the morning.  Discussed with her other physicians regarding additional medication.       History of CVA (cerebrovascular accident)    Still with increased stress related to the previous stroke.  Discussed.  She had questions about possibly joining a stroke support group.  We will see if we can find information.  Continue risk factor modification.  Appears to be tolerating statin.  Adjust medication get blood pressure under better control.       Hypercholesterolemia    She appears to be tolerating the Crestor daily.  Follow-up fast lipid profile and liver function testing.   May be able to increase dose.       Relevant Medications   losartan (COZAAR) 50 MG tablet   Other Relevant Orders   Lipid panel   Hyperglycemia    Low-carb diet and exercise.  Follow metabolic panel and W9V.       Relevant Orders   Hemoglobin A1c   Hypertension    Currently taking losartan 25 mg daily and amlodipine.  Blood pressure as outlined.  Given persistent readings of upper 130s/upper 80s,  will increase losartan to 50 mg daily.  Follow blood pressures.  Schedule follow-up soon to reassess.       Relevant Medications   losartan (COZAAR) 50 MG tablet   Other Relevant Orders   Basic metabolic panel   Lower extremity edema    Being evaluated at the lymphedema clinic.  She also has recently started taking Lasix 20 mg daily.  Has been on this dose now for 2 weeks.  We will schedule follow-up metabolic panel to confirm normal potassium and kidney function.  Continue compression hose.  Continue leg elevation.       Multiple sclerosis ([place])    Seeing neurology.  Per patient, Dr. Harlow Asa increased her Seroquel to 50 mg nightly.  She is sleeping better.  Has follow-up soon.        Return in about 3 weeks (around 04/29/2021) for follow up appt (I think pt prefers virtual visit).  .   I discussed the assessment and treatment plan with the patient. The patient was provided an opportunity to ask questions and all were answered. The patient agreed with the plan and demonstrated an understanding of the instructions.   The patient was advised to call back or seek an in-person evaluation if the symptoms worsen or if the condition fails to improve as anticipated.   Einar Pheasant, MD

## 2021-04-09 ENCOUNTER — Encounter: Payer: Self-pay | Admitting: Internal Medicine

## 2021-04-09 MED ORDER — LOSARTAN POTASSIUM 50 MG PO TABS: 50.0000 mg | ORAL_TABLET | Freq: Every day | ORAL | 1 refills | Status: DC

## 2021-04-09 NOTE — Assessment & Plan Note (Signed)
Low-carb diet and exercise.  Follow metabolic panel and A1c. 

## 2021-04-09 NOTE — Assessment & Plan Note (Signed)
Still with increased stress related to the previous stroke.  Discussed.  She had questions about possibly joining a stroke support group.  We will see if we can find information.  Continue risk factor modification.  Appears to be tolerating statin.  Adjust medication get blood pressure under better control.

## 2021-04-09 NOTE — Assessment & Plan Note (Signed)
Currently taking losartan 25 mg daily and amlodipine.  Blood pressure as outlined.  Given persistent readings of upper 130s/upper 80s,  will increase losartan to 50 mg daily.  Follow blood pressures.  Schedule follow-up soon to reassess.

## 2021-04-09 NOTE — Assessment & Plan Note (Signed)
Seeing neurology.  Per patient, Dr. Harlow Asa increased her Seroquel to 50 mg nightly.  She is sleeping better.  Has follow-up soon.

## 2021-04-09 NOTE — Assessment & Plan Note (Signed)
She is seeing a therapist.  Seroquel dose recently increased by Dr. Pamalee Leyden.  She was questioning adding a small dose in the morning.  Discussed with her other physicians regarding additional medication.

## 2021-04-09 NOTE — Assessment & Plan Note (Signed)
Being evaluated at the lymphedema clinic.  She also has recently started taking Lasix 20 mg daily.  Has been on this dose now for 2 weeks.  We will schedule follow-up metabolic panel to confirm normal potassium and kidney function.  Continue compression hose.  Continue leg elevation.

## 2021-04-09 NOTE — Assessment & Plan Note (Signed)
She appears to be tolerating the Crestor daily.  Follow-up fast lipid profile and liver function testing.  May be able to increase dose.

## 2021-04-09 NOTE — Assessment & Plan Note (Signed)
Had colonoscopy August 2018.  Recommended follow-up colonoscopy in August 2021.  She is overdue.  Discussed today.  Given that she had a stroke less than 6 months ago will need to postpone for now.  Plan to follow-up with colonoscopy in the future.  She is currently not having any GI symptoms.

## 2021-04-11 ENCOUNTER — Encounter: Payer: Medicare Other | Admitting: Occupational Therapy

## 2021-04-11 DIAGNOSIS — M542 Cervicalgia: Secondary | ICD-10-CM | POA: Diagnosis not present

## 2021-04-11 DIAGNOSIS — M79601 Pain in right arm: Secondary | ICD-10-CM | POA: Diagnosis not present

## 2021-04-11 DIAGNOSIS — M5412 Radiculopathy, cervical region: Secondary | ICD-10-CM | POA: Diagnosis not present

## 2021-04-11 DIAGNOSIS — R519 Headache, unspecified: Secondary | ICD-10-CM | POA: Diagnosis not present

## 2021-04-11 DIAGNOSIS — M79602 Pain in left arm: Secondary | ICD-10-CM | POA: Diagnosis not present

## 2021-04-11 DIAGNOSIS — M9900 Segmental and somatic dysfunction of head region: Secondary | ICD-10-CM | POA: Diagnosis not present

## 2021-04-11 DIAGNOSIS — M5413 Radiculopathy, cervicothoracic region: Secondary | ICD-10-CM | POA: Diagnosis not present

## 2021-04-11 DIAGNOSIS — M7918 Myalgia, other site: Secondary | ICD-10-CM | POA: Diagnosis not present

## 2021-04-11 DIAGNOSIS — M9901 Segmental and somatic dysfunction of cervical region: Secondary | ICD-10-CM | POA: Diagnosis not present

## 2021-04-13 ENCOUNTER — Other Ambulatory Visit: Payer: Self-pay | Admitting: Internal Medicine

## 2021-04-16 ENCOUNTER — Encounter: Payer: Medicare Other | Admitting: Occupational Therapy

## 2021-04-17 ENCOUNTER — Other Ambulatory Visit: Payer: Self-pay

## 2021-04-17 ENCOUNTER — Telehealth: Payer: Self-pay | Admitting: Internal Medicine

## 2021-04-17 ENCOUNTER — Ambulatory Visit (INDEPENDENT_AMBULATORY_CARE_PROVIDER_SITE_OTHER): Payer: Medicare Other | Admitting: Internal Medicine

## 2021-04-17 DIAGNOSIS — E7800 Pure hypercholesterolemia, unspecified: Secondary | ICD-10-CM | POA: Diagnosis not present

## 2021-04-17 DIAGNOSIS — M9900 Segmental and somatic dysfunction of head region: Secondary | ICD-10-CM | POA: Diagnosis not present

## 2021-04-17 DIAGNOSIS — M79601 Pain in right arm: Secondary | ICD-10-CM | POA: Diagnosis not present

## 2021-04-17 DIAGNOSIS — R739 Hyperglycemia, unspecified: Secondary | ICD-10-CM

## 2021-04-17 DIAGNOSIS — M5413 Radiculopathy, cervicothoracic region: Secondary | ICD-10-CM | POA: Diagnosis not present

## 2021-04-17 DIAGNOSIS — I10 Essential (primary) hypertension: Secondary | ICD-10-CM | POA: Diagnosis not present

## 2021-04-17 DIAGNOSIS — R519 Headache, unspecified: Secondary | ICD-10-CM | POA: Diagnosis not present

## 2021-04-17 DIAGNOSIS — M79602 Pain in left arm: Secondary | ICD-10-CM | POA: Diagnosis not present

## 2021-04-17 DIAGNOSIS — M542 Cervicalgia: Secondary | ICD-10-CM | POA: Diagnosis not present

## 2021-04-17 DIAGNOSIS — M5412 Radiculopathy, cervical region: Secondary | ICD-10-CM | POA: Diagnosis not present

## 2021-04-17 DIAGNOSIS — M9901 Segmental and somatic dysfunction of cervical region: Secondary | ICD-10-CM | POA: Diagnosis not present

## 2021-04-17 DIAGNOSIS — M7918 Myalgia, other site: Secondary | ICD-10-CM | POA: Diagnosis not present

## 2021-04-17 LAB — LIPID PANEL
Cholesterol: 181 mg/dL (ref 0–200)
HDL: 84.9 mg/dL (ref >39.00)
LDL Cholesterol: 75 mg/dL (ref 0–99)
NonHDL: 95.85
Total CHOL/HDL Ratio: 2
Triglycerides: 102 mg/dL (ref 0.0–149.0)
VLDL: 20.4 mg/dL (ref 0.0–40.0)

## 2021-04-17 LAB — BASIC METABOLIC PANEL
BUN: 11 mg/dL (ref 6–23)
CO2: 26 mEq/L (ref 19–32)
Calcium: 9.7 mg/dL (ref 8.4–10.5)
Chloride: 106 mEq/L (ref 96–112)
Creatinine, Ser: 0.66 mg/dL (ref 0.40–1.20)
GFR: 88.67 mL/min (ref >60.00)
Glucose, Bld: 115 mg/dL — ABNORMAL HIGH (ref 70–99)
Potassium: 3.7 mEq/L (ref 3.5–5.1)
Sodium: 141 mEq/L (ref 135–145)

## 2021-04-17 LAB — HEMOGLOBIN A1C: Hgb A1c MFr Bld: 6.3 % (ref 4.6–6.5)

## 2021-04-17 NOTE — Telephone Encounter (Signed)
-----   Message from [name], MD sent at 04/09/2021  5:45 AM EDT ----- Regarding: RE: update and question Thanks so much for your message Dr. Nicki Reaper, absolutely either of these options would be ok- I will give her a call today to discuss! ----- Message ----- From: Einar Pheasant, MD Sent: 04/09/2021   2:47 AM EDT To: [name], MD Subject: update and question                            I saw Ms Paff recently for follow up regarding her blood pressure.  She is sleeping better (and feels better) on seroquel.  Per pt, she is currently taking 50mg  q hs.  She was questioning if she could take a low dose of seroquel in the morning.  If not seroquel, then she was questioning taking another medication to help control stress/anxiety during the day.  Since you initially prescribed the medication, wanted to give you an update regarding her request.  She informs me she has a follow up with you and was not sure if you would be willing to adjust her medication or prescribe a different medication.  Thank you for your help.   Einar Pheasant

## 2021-04-17 NOTE — Telephone Encounter (Signed)
-----   Message from Deirdre Peer, [place] sent at 04/09/2021  9:37 AM EDT ----- Regarding: RE: question Avyana Puffenbarger,   Good morning, I am covering for [place].  I believe the [facility] has one and here is the link to their site:  [email or web address]  I am not certain of their current status with COVID, etc.  You may want to call the [place] and/or the [facility] within [facility] this helps- Please reach out to [place] if there are other questions or needs. I am sure Di Kindle will be following up with patient upon her return.    Thanks, Eduard Clos MSW, LCSW Licensed Clinical Social Worker [phone]  ----- Message ----- From: Einar Pheasant, MD Sent: 04/09/2021   2:52 AM EDT To: Francis Gaines, LCSW Subject: question                                       Ms Sheets had a recent stroke.  She had a question regarding joining a stroke support group.  Are you aware of any local stroke support groups?  Thank you for your help.   Einar Pheasant

## 2021-04-18 ENCOUNTER — Encounter: Payer: Self-pay | Admitting: Internal Medicine

## 2021-04-18 ENCOUNTER — Other Ambulatory Visit: Payer: Self-pay | Admitting: Internal Medicine

## 2021-04-18 DIAGNOSIS — M79602 Pain in left arm: Secondary | ICD-10-CM | POA: Diagnosis not present

## 2021-04-18 DIAGNOSIS — M9900 Segmental and somatic dysfunction of head region: Secondary | ICD-10-CM | POA: Diagnosis not present

## 2021-04-18 DIAGNOSIS — R519 Headache, unspecified: Secondary | ICD-10-CM | POA: Diagnosis not present

## 2021-04-18 DIAGNOSIS — M5412 Radiculopathy, cervical region: Secondary | ICD-10-CM | POA: Diagnosis not present

## 2021-04-18 DIAGNOSIS — M79601 Pain in right arm: Secondary | ICD-10-CM | POA: Diagnosis not present

## 2021-04-18 DIAGNOSIS — M7918 Myalgia, other site: Secondary | ICD-10-CM | POA: Diagnosis not present

## 2021-04-18 DIAGNOSIS — M5413 Radiculopathy, cervicothoracic region: Secondary | ICD-10-CM | POA: Diagnosis not present

## 2021-04-18 DIAGNOSIS — M9901 Segmental and somatic dysfunction of cervical region: Secondary | ICD-10-CM | POA: Diagnosis not present

## 2021-04-18 DIAGNOSIS — M542 Cervicalgia: Secondary | ICD-10-CM | POA: Diagnosis not present

## 2021-04-18 NOTE — Telephone Encounter (Signed)
Called and spoke with Talishia. Gave Channel the numbers to the [place] brain injury and stroke support group, the [place] stroke support group, and the [place] stroke support group. Kassondra states that she will call them and call us back if they are not helpful.

## 2021-04-18 NOTE — Progress Notes (Signed)
Orders placed for labs

## 2021-04-19 DIAGNOSIS — G35 Multiple sclerosis: Secondary | ICD-10-CM | POA: Diagnosis not present

## 2021-04-19 DIAGNOSIS — I69351 Hemiplegia and hemiparesis following cerebral infarction affecting right dominant side: Secondary | ICD-10-CM | POA: Diagnosis not present

## 2021-04-19 DIAGNOSIS — M21372 Foot drop, left foot: Secondary | ICD-10-CM | POA: Diagnosis not present

## 2021-04-19 DIAGNOSIS — E785 Hyperlipidemia, unspecified: Secondary | ICD-10-CM | POA: Diagnosis not present

## 2021-04-19 DIAGNOSIS — I10 Essential (primary) hypertension: Secondary | ICD-10-CM | POA: Diagnosis not present

## 2021-04-19 DIAGNOSIS — F482 Pseudobulbar affect: Secondary | ICD-10-CM | POA: Diagnosis not present

## 2021-04-22 ENCOUNTER — Encounter: Payer: Medicare Other | Admitting: Occupational Therapy

## 2021-04-25 ENCOUNTER — Telehealth: Payer: Self-pay | Admitting: Internal Medicine

## 2021-04-25 DIAGNOSIS — M79602 Pain in left arm: Secondary | ICD-10-CM | POA: Diagnosis not present

## 2021-04-25 DIAGNOSIS — R29898 Other symptoms and signs involving the musculoskeletal system: Secondary | ICD-10-CM

## 2021-04-25 DIAGNOSIS — M542 Cervicalgia: Secondary | ICD-10-CM | POA: Diagnosis not present

## 2021-04-25 DIAGNOSIS — M5413 Radiculopathy, cervicothoracic region: Secondary | ICD-10-CM | POA: Diagnosis not present

## 2021-04-25 DIAGNOSIS — M9901 Segmental and somatic dysfunction of cervical region: Secondary | ICD-10-CM | POA: Diagnosis not present

## 2021-04-25 DIAGNOSIS — M9900 Segmental and somatic dysfunction of head region: Secondary | ICD-10-CM | POA: Diagnosis not present

## 2021-04-25 DIAGNOSIS — Z8673 Personal history of transient ischemic attack (TIA), and cerebral infarction without residual deficits: Secondary | ICD-10-CM

## 2021-04-25 DIAGNOSIS — M79601 Pain in right arm: Secondary | ICD-10-CM | POA: Diagnosis not present

## 2021-04-25 DIAGNOSIS — M5412 Radiculopathy, cervical region: Secondary | ICD-10-CM | POA: Diagnosis not present

## 2021-04-25 DIAGNOSIS — M7918 Myalgia, other site: Secondary | ICD-10-CM | POA: Diagnosis not present

## 2021-04-25 DIAGNOSIS — R519 Headache, unspecified: Secondary | ICD-10-CM | POA: Diagnosis not present

## 2021-04-25 NOTE — Telephone Encounter (Signed)
Are you agreeable to placing referral for patient or do I need to call for more info?

## 2021-04-25 NOTE — Telephone Encounter (Signed)
PT called to request a referral to a Nutritionist. She saw what her Glucose levels were off of her last labs and don't like them and would like to see a Nutritionist to find out what she should and should not be eating.

## 2021-04-26 ENCOUNTER — Telehealth: Payer: Self-pay | Admitting: Internal Medicine

## 2021-04-26 NOTE — Telephone Encounter (Signed)
I am ok with placing order for referral.  Does she have a preference?  I can refer to [facility] if not.  Also, per last video visit, she was supposed to be scheduled for a 3 week f/u appt.  Please schedule. Can schedule for 05/02/21 - 3:30 spot.  I think she prefers virtual appts.

## 2021-04-26 NOTE — Telephone Encounter (Signed)
PT called to return the missed call and would like for Larena Glassman to call her back specifically

## 2021-04-26 NOTE — Telephone Encounter (Signed)
LMTCB

## 2021-04-26 NOTE — Telephone Encounter (Signed)
Patient has been scheduled for virtual next week. Also, she would like to go to [facility] for nutritionist. (This is who her husband sees and he told her that he could help her) Also, regarding other message  about PT for her hand. She is requesting to to go [facility] for PT for her hand.

## 2021-04-26 NOTE — Telephone Encounter (Signed)
Patient is requesting a referral for PT for her rt hand, she had a stroke and feels PT will help.

## 2021-04-27 ENCOUNTER — Other Ambulatory Visit: Payer: Self-pay | Admitting: Internal Medicine

## 2021-04-28 NOTE — Addendum Note (Signed)
Addended by: Alisa Graff on: 04/28/2021 11:22 AM   Modules accepted: Orders

## 2021-04-28 NOTE — Telephone Encounter (Signed)
Order placed for referral to [facility] for PT.  Regarding her referral to a nutritionist - [facility] - where is he located?  Is he a chiropractor?  If so, do not need referral.

## 2021-04-28 NOTE — Telephone Encounter (Signed)
Order placed for referral to PT.  See other phone message for referral and for response to request for referral to a nutritionist.

## 2021-04-29 NOTE — Telephone Encounter (Signed)
Patient is aware of below. 

## 2021-05-01 DIAGNOSIS — G35 Multiple sclerosis: Secondary | ICD-10-CM | POA: Diagnosis not present

## 2021-05-02 ENCOUNTER — Telehealth: Payer: Self-pay | Admitting: *Deleted

## 2021-05-02 ENCOUNTER — Telehealth: Payer: Medicare Other | Admitting: Internal Medicine

## 2021-05-02 NOTE — Telephone Encounter (Signed)
Tiffany from [facility] called to request approval for 2wk3,1wk1.  I have called Tiffany back to ask if this is for the lymphadema therapy that Dr Ranell Patrick ordered in May. Otherwise it looks like her PCP has ordered outpt PT with [facility] and therefore we would not sign orders. I have requested she call back to clarify.

## 2021-05-02 NOTE — Telephone Encounter (Signed)
This POC is coverage for June through 04/19/21 at which time she was discharged from [facility] max potential met. They had called twice and never received a call back.I have given her the approval to cover that time frame and she will be under the care of PCP for PT for current therapy.

## 2021-05-06 DIAGNOSIS — M79601 Pain in right arm: Secondary | ICD-10-CM | POA: Diagnosis not present

## 2021-05-06 DIAGNOSIS — J069 Acute upper respiratory infection, unspecified: Secondary | ICD-10-CM | POA: Diagnosis not present

## 2021-05-06 DIAGNOSIS — M7918 Myalgia, other site: Secondary | ICD-10-CM | POA: Diagnosis not present

## 2021-05-06 DIAGNOSIS — M542 Cervicalgia: Secondary | ICD-10-CM | POA: Diagnosis not present

## 2021-05-06 DIAGNOSIS — M5413 Radiculopathy, cervicothoracic region: Secondary | ICD-10-CM | POA: Diagnosis not present

## 2021-05-06 DIAGNOSIS — H6123 Impacted cerumen, bilateral: Secondary | ICD-10-CM | POA: Diagnosis not present

## 2021-05-06 DIAGNOSIS — M79602 Pain in left arm: Secondary | ICD-10-CM | POA: Diagnosis not present

## 2021-05-06 DIAGNOSIS — R519 Headache, unspecified: Secondary | ICD-10-CM | POA: Diagnosis not present

## 2021-05-06 DIAGNOSIS — M9900 Segmental and somatic dysfunction of head region: Secondary | ICD-10-CM | POA: Diagnosis not present

## 2021-05-06 DIAGNOSIS — R490 Dysphonia: Secondary | ICD-10-CM | POA: Diagnosis not present

## 2021-05-06 DIAGNOSIS — M9901 Segmental and somatic dysfunction of cervical region: Secondary | ICD-10-CM | POA: Diagnosis not present

## 2021-05-06 DIAGNOSIS — M5412 Radiculopathy, cervical region: Secondary | ICD-10-CM | POA: Diagnosis not present

## 2021-05-08 ENCOUNTER — Telehealth: Payer: Self-pay | Admitting: Internal Medicine

## 2021-05-08 NOTE — Telephone Encounter (Signed)
Patient is overdue for followup. Offered today at 2. Patient was unable to do that time. Scheduled for tomorrow at 10. Pt will discuss message below at appt. Confirmed doing ok.

## 2021-05-08 NOTE — Telephone Encounter (Signed)
Patient called and said she needs to see Dr Nicki Reaper again. She needs medications refilled that she can't get filled. She also has some questions about her pass stroke.

## 2021-05-09 ENCOUNTER — Telehealth (INDEPENDENT_AMBULATORY_CARE_PROVIDER_SITE_OTHER): Payer: Medicare Other | Admitting: Internal Medicine

## 2021-05-09 ENCOUNTER — Encounter: Payer: Self-pay | Admitting: Internal Medicine

## 2021-05-09 DIAGNOSIS — I10 Essential (primary) hypertension: Secondary | ICD-10-CM | POA: Diagnosis not present

## 2021-05-09 DIAGNOSIS — Z8673 Personal history of transient ischemic attack (TIA), and cerebral infarction without residual deficits: Secondary | ICD-10-CM

## 2021-05-09 DIAGNOSIS — R739 Hyperglycemia, unspecified: Secondary | ICD-10-CM | POA: Diagnosis not present

## 2021-05-09 DIAGNOSIS — F339 Major depressive disorder, recurrent, unspecified: Secondary | ICD-10-CM | POA: Diagnosis not present

## 2021-05-09 DIAGNOSIS — E7800 Pure hypercholesterolemia, unspecified: Secondary | ICD-10-CM | POA: Diagnosis not present

## 2021-05-09 DIAGNOSIS — K635 Polyp of colon: Secondary | ICD-10-CM | POA: Diagnosis not present

## 2021-05-09 DIAGNOSIS — I639 Cerebral infarction, unspecified: Secondary | ICD-10-CM

## 2021-05-09 DIAGNOSIS — G35 Multiple sclerosis: Secondary | ICD-10-CM | POA: Diagnosis not present

## 2021-05-09 DIAGNOSIS — R600 Localized edema: Secondary | ICD-10-CM

## 2021-05-09 NOTE — Progress Notes (Signed)
Patient ID: Carly [name], female   DOB: 1951/05/26, 70 y.o.   MRN: [id number]   Virtual Visit via video Note  This visit type was conducted due to national recommendations for restrictions regarding the COVID-19 pandemic (e.g. social distancing).  This format is felt to be most appropriate for this patient at this time.  All issues noted in this document were discussed and addressed.  No physical exam was performed (except for noted visual exam findings with Video Visits).   I connected with [name] by a video enabled telemedicine application and verified that I am speaking with the correct person using two identifiers. Location patient: home Location provider: work Persons participating in the virtual visit: patient, provider and pts husband.   The limitations, risks, security and privacy concerns of performing an evaluation and management service by video and the availability of in person appointments have been discussed.  It has also been discussed with the patient that there may be a patient responsible charge related to this service. The patient expressed understanding and agreed to proceed.   Reason for visit: scheduled follow up.   HPI: Follow up regarding her blood pressure and history of cva.  Increased stress related.  Discussed.  Seeing a therapist.  Also given information regarding stroke support groups.  Overall appears to be doing better.  Blood pressure better. Outside blood pressure checks:  133/80, 123/76 and 123/70.  No chest pain.  Breathing stable.  No acid reflux reported.  No abdominal pain.  Bowels moving.  Discussed PT.  She is interested in PT for her hand.  Has talked with Stephani Police - therapist - [place].  Hard for her to grip.  Hard to write.  Would like therapy. Discussed lasix.  She started taking '40mg'$  q day.  Lower extremity swelling - discussed.  Discussed lymphedema and therapy.  Planning to see Dr Birdie Sons - for nutrition advice, etc.    ROS: See  pertinent positives and negatives per HPI.  Past Medical History:  Diagnosis Date   Allergy    Anxiety    Aspiration pneumonia (HCC)    Depression    Frequent headaches    H/O   GERD (gastroesophageal reflux disease)    History of chicken pox    History of colon polyps    Hx of migraines    Multiple sclerosis (HCC)    PONV (postoperative nausea and vomiting)     Past Surgical History:  Procedure Laterality Date   BACK SURGERY     CHOLECYSTECTOMY     COLONOSCOPY WITH PROPOFOL N/A 05/18/2017   Procedure: COLONOSCOPY WITH PROPOFOL;  Surgeon: [name], MD;  Location: [facility] ENDOSCOPY;  Service: Endoscopy;  Laterality: N/A;   FOOT SURGERY  2015   GALLBLADDER SURGERY  2008   HARDWARE REMOVAL Left 02/14/2016   Procedure: LEFT FOOT REMOVAL DEEP IMPLANT;  Surgeon: [name], MD;  Location: [facility];  Service: Orthopedics;  Laterality: Left;   HERNIA REPAIR     Inguinal Hernia Repair   SPINE SURGERY  2014    Family History  Problem Relation Age of Onset   Arthritis Mother    Hypertension Mother    Macular degeneration Mother    Hypertension Father    Hyperlipidemia Father    Heart disease Maternal Grandfather    Diabetes Maternal Grandfather    Kidney disease Paternal Grandmother     SOCIAL HX: reviewed.    Current Outpatient Medications:    acetaminophen (TYLENOL) 500 MG  tablet, Take 1 tablet (500 mg total) by mouth every 4 (four) hours as needed., Disp: 30 tablet, Rfl: 0   amLODipine (NORVASC) 5 MG tablet, TAKE 1 TABLET BY MOUTH DAILY., Disp: 90 tablet, Rfl: 1   Baclofen 5 MG TABS, Take 1 tablet by mouth 3 (three) times daily., Disp: , Rfl:    Cholecalciferol (D3-1000 PO), Take 2,000 Units by mouth daily., Disp: , Rfl:    clopidogrel (PLAVIX) 75 MG tablet, TAKE 1 TABLET (75 MG) BY MOUTH EVERY DAY, Disp: 30 tablet, Rfl: 0   Coenzyme Q10 (CO Q10) 100 MG CAPS, Take 1 capsule by mouth daily., Disp: , Rfl:    dalfampridine 10 MG TB12, Take 10 mg by  mouth every 12 (twelve) hours., Disp: , Rfl:    docusate sodium (COLACE) 100 MG capsule, Take 100 mg by mouth 2 (two) times daily., Disp: , Rfl:    DULoxetine (CYMBALTA) 60 MG capsule, Take 1 capsule (60 mg total) by mouth daily., Disp: 30 capsule, Rfl: 0   fexofenadine (ALLEGRA ALLERGY) 180 MG tablet, Take 1 tablet (180 mg total) by mouth daily., Disp: 30 tablet, Rfl: 2   fluticasone (FLONASE) 50 MCG/ACT nasal spray, Place 1 spray into both nostrils daily., Disp: 16 g, Rfl: 2   furosemide (LASIX) 20 MG tablet, TAKE 1 TABLET BY MOUTH DAILY AS NEEDED., Disp: 30 tablet, Rfl: 0   ketoconazole (NIZORAL) 2 % cream, SMARTSIG:1 Application Topical Morning-Night, Disp: , Rfl:    losartan (COZAAR) 50 MG tablet, Take 1 tablet (50 mg total) by mouth daily., Disp: 90 tablet, Rfl: 1   magnesium oxide (MAG-OX) 400 MG tablet, TAKE 1 TABLET BY MOUTH DAILY, Disp: 30 tablet, Rfl: 5   METAMUCIL FIBER PO, Take by mouth 2 (two) times daily., Disp: , Rfl:    mirtazapine (REMERON) 30 MG tablet, Take 30 mg by mouth at bedtime., Disp: , Rfl:    nystatin cream (MYCOSTATIN), Apply 1 application topically 2 (two) times daily., Disp: 30 g, Rfl: 0   omeprazole (PRILOSEC) 20 MG capsule, Take 20 mg by mouth 2 (two) times daily before a meal., Disp: , Rfl:    QUEtiapine (SEROQUEL) 25 MG tablet, Take 1/2 pill in morning and a whole pill at bedtime., Disp: 45 tablet, Rfl: 0   rosuvastatin (CRESTOR) 5 MG tablet, Take 1 tablet (5 mg total) by mouth daily., Disp: 90 tablet, Rfl: 1   saccharomyces boulardii (FLORASTOR) 250 MG capsule, Take 1 capsule (250 mg total) by mouth 2 (two) times daily., Disp: 60 capsule, Rfl: 0   sodium chloride (OCEAN) 0.65 % nasal spray, Place 1-2 sprays into the nose as needed., Disp: , Rfl:  No current facility-administered medications for this visit.  Facility-Administered Medications Ordered in Other Visits:    0.9 %  sodium chloride infusion, , Intravenous, Continuous, Corcoran, Melissa C, MD, Last  Rate: 10 mL/hr at 04/25/16 0855, New Bag at 04/25/16 0855   acetaminophen (TYLENOL) tablet 650 mg, 650 mg, Oral, Once, Corcoran, Drue Second, MD  EXAM:  VITALS per patient if applicable: [id number]  GENERAL: alert, oriented, appears well and in no acute distress  HEENT: atraumatic, conjunttiva clear, no obvious abnormalities on inspection of external nose and ears  NECK: normal movements of the head and neck  LUNGS: on inspection no signs of respiratory distress, breathing rate appears normal, no obvious gross SOB, gasping or wheezing  CV: no obvious cyanosis  PSYCH/NEURO: pleasant and cooperative, no obvious depression or anxiety, speech and thought processing grossly intact  ASSESSMENT AND PLAN:  Discussed the following assessment and plan:  Problem List Items Addressed This Visit     Colon polyp    Discussed colonoscopy.  Last colonoscopy 05/2017.  Recommended f/u in 05/2020.  S/p recent cva so will postpone for now.  Will need in future.  Pt in agreement.         Depression, recurrent ([place])    Followed by psychiatry.  On seroquel.  Continues remeron.  Overall appears to be doing better. Follow.        History of CVA (cerebrovascular accident)    Still with increased stress related to the previous stroke.  Discussed.  Overall appears to be doing some better.  Information previously given regarding a stroke support group.  Continue risk factor modification.  Appears to be tolerating statin.  Blood pressure better. Having problems with gripping and writing.  D/w therapy regarding hand therapy.         Hypercholesterolemia    On crestor.  Low cholesterol diet and exercise.  Follow lipid panel and liver function tests.        Hyperglycemia    Low-carb diet and exercise.  Follow metabolic panel and [id number].       Hypertension    Blood pressure much improved.  Continue losartan and amlodipine.  Follow pressures.  Follow metabolic panel.        Lower extremity edema    Has  been evaluated at the lymphedema clinic.  Continue.  On lasix.  Decrease back to '20mg'$  dose.  Follow renal function.  Compression hose.  Leg elevation.  Follow.        Multiple sclerosis ([place])    Seeing neurology.  Stable.          Return in about 4 weeks (around 06/06/2021) for follow up appt (71mn).   I discussed the assessment and treatment plan with the patient. The patient was provided an opportunity to ask questions and all were answered. The patient agreed with the plan and demonstrated an understanding of the instructions.   The patient was advised to call back or seek an in-person evaluation if the symptoms worsen or if the condition fails to improve as anticipated.   CEinar Pheasant MD

## 2021-05-13 DIAGNOSIS — R519 Headache, unspecified: Secondary | ICD-10-CM | POA: Diagnosis not present

## 2021-05-13 DIAGNOSIS — M5412 Radiculopathy, cervical region: Secondary | ICD-10-CM | POA: Diagnosis not present

## 2021-05-13 DIAGNOSIS — M5413 Radiculopathy, cervicothoracic region: Secondary | ICD-10-CM | POA: Diagnosis not present

## 2021-05-13 DIAGNOSIS — M542 Cervicalgia: Secondary | ICD-10-CM | POA: Diagnosis not present

## 2021-05-13 DIAGNOSIS — M79601 Pain in right arm: Secondary | ICD-10-CM | POA: Diagnosis not present

## 2021-05-13 DIAGNOSIS — M9901 Segmental and somatic dysfunction of cervical region: Secondary | ICD-10-CM | POA: Diagnosis not present

## 2021-05-13 DIAGNOSIS — M9900 Segmental and somatic dysfunction of head region: Secondary | ICD-10-CM | POA: Diagnosis not present

## 2021-05-13 DIAGNOSIS — M79602 Pain in left arm: Secondary | ICD-10-CM | POA: Diagnosis not present

## 2021-05-13 DIAGNOSIS — M7918 Myalgia, other site: Secondary | ICD-10-CM | POA: Diagnosis not present

## 2021-05-14 ENCOUNTER — Other Ambulatory Visit: Payer: Self-pay

## 2021-05-14 ENCOUNTER — Encounter: Payer: Self-pay | Admitting: Internal Medicine

## 2021-05-14 ENCOUNTER — Encounter: Payer: Medicare Other | Attending: Physical Medicine and Rehabilitation | Admitting: Psychology

## 2021-05-14 DIAGNOSIS — Z8673 Personal history of transient ischemic attack (TIA), and cerebral infarction without residual deficits: Secondary | ICD-10-CM | POA: Insufficient documentation

## 2021-05-14 DIAGNOSIS — F411 Generalized anxiety disorder: Secondary | ICD-10-CM | POA: Diagnosis not present

## 2021-05-14 DIAGNOSIS — G35 Multiple sclerosis: Secondary | ICD-10-CM | POA: Insufficient documentation

## 2021-05-14 DIAGNOSIS — I890 Lymphedema, not elsewhere classified: Secondary | ICD-10-CM | POA: Diagnosis not present

## 2021-05-14 DIAGNOSIS — F418 Other specified anxiety disorders: Secondary | ICD-10-CM | POA: Insufficient documentation

## 2021-05-14 DIAGNOSIS — R4689 Other symptoms and signs involving appearance and behavior: Secondary | ICD-10-CM | POA: Diagnosis not present

## 2021-05-14 DIAGNOSIS — R4189 Other symptoms and signs involving cognitive functions and awareness: Secondary | ICD-10-CM | POA: Diagnosis not present

## 2021-05-14 NOTE — Assessment & Plan Note (Signed)
On crestor.  Low cholesterol diet and exercise.  Follow lipid panel and liver function tests.   

## 2021-05-14 NOTE — Assessment & Plan Note (Signed)
Seeing neurology.  Stable.

## 2021-05-14 NOTE — Assessment & Plan Note (Signed)
Followed by psychiatry.  On seroquel.  Continues remeron.  Overall appears to be doing better. Follow.

## 2021-05-14 NOTE — Assessment & Plan Note (Addendum)
Still with increased stress related to the previous stroke.  Discussed.  Overall appears to be doing some better.  Information previously given regarding a stroke support group.  Continue risk factor modification.  Appears to be tolerating statin.  Blood pressure better. Having problems with gripping and writing.  D/w therapy regarding hand therapy.

## 2021-05-14 NOTE — Assessment & Plan Note (Signed)
Discussed colonoscopy.  Last colonoscopy 05/2017.  Recommended f/u in 05/2020.  S/p recent cva so will postpone for now.  Will need in future.  Pt in agreement.

## 2021-05-14 NOTE — Assessment & Plan Note (Signed)
Low-carb diet and exercise.  Follow metabolic panel and A1c. 

## 2021-05-14 NOTE — Assessment & Plan Note (Signed)
Has been evaluated at the lymphedema clinic.  Continue.  On lasix.  Decrease back to '20mg'$  dose.  Follow renal function.  Compression hose.  Leg elevation.  Follow.

## 2021-05-14 NOTE — Assessment & Plan Note (Signed)
Blood pressure much improved.  Continue losartan and amlodipine.  Follow pressures.  Follow metabolic panel.

## 2021-05-16 ENCOUNTER — Encounter: Payer: Self-pay | Admitting: Psychology

## 2021-05-16 ENCOUNTER — Telehealth: Payer: Self-pay

## 2021-05-16 DIAGNOSIS — G35 Multiple sclerosis: Secondary | ICD-10-CM | POA: Diagnosis not present

## 2021-05-16 DIAGNOSIS — I890 Lymphedema, not elsewhere classified: Secondary | ICD-10-CM

## 2021-05-16 DIAGNOSIS — Z8673 Personal history of transient ischemic attack (TIA), and cerebral infarction without residual deficits: Secondary | ICD-10-CM

## 2021-05-16 NOTE — Telephone Encounter (Signed)
Pt was calling to see if Dr Nicki Reaper has found out if she can go to the same place for her leg AND right arm for physical therapy. Please advise.

## 2021-05-16 NOTE — Progress Notes (Signed)
Neuropsychological Consultation   Patient:   Carly [name]   DOB:   [birth year]  MR Number:  [id number]  Location:  [facility] [place], [place] [id number] [place] [address] Dept: [phone]           Date of Service:   05/14/2021  Start Time:   10 AM End Time:   12 PM  Today's visit was an in person visit that was conducted in my outpatient clinic office with the patient, her husband and myself present.  1 hour and 15 minutes were spent in formal face-to-face clinical interview.  The other 45 minutes was spent with records review report writing and decision making.  Provider/Observer:  Ilean Skill, Psy.D.       Clinical Neuropsychologist       Billing Code/Service: 96116/96121  Chief Complaint:    Carly [name] is a 70 year old female referred for follow-up visit after her inpatient hospitalization in March of this year.  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  The patient is continued to have right arm motor deficits and difficulty with use.  She is also having trouble eating and swallowing and trouble getting in and out of the shower with increased fear of falling.  The patient's husband reports that the patient has fallen a number of times and while not falling in the shower she has a great deal of phobia and fear around falling in the shower.  They describe an overreaction to getting into the shower and she still has trouble with her right dominant hand especially when in use.  The patient's past medical history includes history of MS, GERD, anxiety/depression, headache.  Reason for Service:  Carly [name] is a 70 year old female referred for follow-up visit after her inpatient hospitalization in March of this year.  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  The patient is continued to have right arm motor deficits and difficulty with use.  She is also having trouble eating and swallowing and trouble getting in and out of the shower with increased fear of falling.  The patient's husband reports that the patient has fallen a number of times and while not falling in the shower she has a great deal of phobia and fear around falling in the shower.  They describe an overreaction to getting into the shower and she still has trouble with her right dominant hand especially when in use.  The patient's past medical history includes history of MS, GERD, anxiety/depression, headache.  The patient had presented to the emergency department on 12/08/2020 after a 1 day history of weakness with difficulty standing, dizziness, mental status changes and difficulty picking up items with her right hand.  MRI of her brain was done revealing acute white matter infarcts and deep white matter left corona radiata and extensive abnormal T2/FLAIR hyperintense signal.  MRI brain was negative for stenosis or LVO but showed diffuse atherosclerotic irregularity most severe in PCA branches.  Neurology felt that the stroke was due to small vessel disease.  During her hospitalization psychiatry was initially consulted with assistance due to management of chronic generalized anxiety disorder with bouts  of lability.  The patient has chronic left-sided weakness likely due to MS and now right-sided weakness affecting her balance and motor functioning.  Patient had a right lateral lean with left knee buckling along with initial reduced information processing speed and difficulties with memory recall versus an inability to learn and store new information.  I did see the patient during her inpatient hospitalization on the comprehensive  rehabilitation unit for 1 visit.  This appointment is a follow-up from her rehab stay and a result of her continuation of difficulties.  The patient acknowledges an exacerbation of her longstanding anxiety and depressive symptomatology.  She reports that fear of falling and other difficulties with present.  Behavioral Observation: Carly [name]  presents as a 70 y.o.-year-old Right handed Caucasian Female who appeared her stated age. her dress was Appropriate and she was Well Groomed and her manners were Appropriate to the situation.  her participation was indicative of Appropriate behaviors.  There were physical disabilities noted.  she displayed an appropriate level of cooperation and motivation.     Interactions:    Active Appropriate  Attention:   abnormal and attention span appeared shorter than expected for age  Memory:   abnormal; remote memory intact, recent memory impaired  Visuo-spatial:  not examined  Speech (Volume):  normal  Speech:   normal; normal  Thought Process:  Coherent and Relevant  Though Content:  WNL; not suicidal and not homicidal  Orientation:   person, place, time/date, and situation  Judgment:   Good  Planning:   Fair  Affect:    Anxious  Mood:    Dysphoric  Insight:   Good  Intelligence:   normal  Marital Status/Living: The patient was born and raised in [place] and is married and she has been married for quite some time.  Current Employment: The patient is retired.  Education:   The patient completed high school and took 1 year of college.  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[place]) 12/13/2020   Generalized anxiety disorder 12/11/2020   Acute focal neurological deficit 12/08/2020   Edema 09/28/2020   Bloating 08/25/2020   Environmental allergies 07/17/2019   Hyperglycemia 07/17/2019   Cough 06/06/2019   Memory change 05/29/2019   Abnormal liver function tests 01/09/2019   Hemorrhoid 09/27/2018   Nausea 08/11/2018   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   Depression with anxiety 02/04/2018   Cognitive and behavioral changes 02/04/2018   Constipation 11/17/2017   Hx of adenomatous colonic polyps 06/06/2016   Lower extremity edema 01/13/2016   Bilateral ovarian cysts 10/16/2015   Health care maintenance 10/16/2015   Colon polyp 01/25/2015   Bone/cartilage disorder 01/25/2015   Headache 11/18/2014  Depression, recurrent ([place]) 11/18/2014   Greater tuberosity of humerus fracture 11/18/2014   Multiple sclerosis ([place]) 11/18/2014   Unsteady gait 11/18/2014   Dizziness 11/18/2014   Inconclusive mammogram 03/15/2013   S/P lumbar spinal fusion 01/04/2013   Surgery, other elective 12/03/2012   DDD (degenerative disc disease), lumbosacral 10/15/2012   Back ache 09/06/2012   Hypertension 09/06/2012   Back pain 09/06/2012   Hypercholesterolemia 01/28/2012   Climacteric 01/28/2012   Routine general medical examination at a health care facility 01/28/2012   Avitaminosis D 01/28/2012      Psychiatric History:  The patient has a long history of anxiety and depressive symptomatology and her current difficulties including ongoing issues with multiple sclerosis as well as her stroke at the end of February of this year have magnified and  highlighted her anxiety symptoms.  Family Med/Psych History:  Family History  Problem Relation Age of Onset   Arthritis Mother    Hypertension Mother    Macular degeneration Mother    Hypertension Father    Hyperlipidemia Father    Heart disease Maternal Grandfather    Diabetes Maternal Grandfather    Kidney disease Paternal Grandmother     Impression/DX:  Carly [name] is a 70 year old female referred for follow-up visit after her inpatient hospitalization in March of this year.  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  The patient is continued to have right arm motor deficits and difficulty with use.  She is also having trouble eating and swallowing and trouble getting in and out of the shower with increased fear of falling.  The patient's husband reports that the patient has fallen a number of times and while not falling in the shower she has a great deal of phobia and fear around falling in the shower.  They describe an overreaction to getting into the shower and she still has trouble with her right dominant hand especially when in use.  The patient's past medical history includes history of MS, GERD, anxiety/depression, headache.  Disposition/Plan:  I do think we have a pretty good handle on the residual cognitive changes or deficits and most of her issues following stroke as well as issues with MS have to do with motor functioning and exacerbation of anxiety and depression.  At this point I do not think that there is a particular need to do any formal neuropsychological testing and assessment but rather focus on psychotherapeutic interventions around coping and managing with residual effects of her cerebrovascular accident as well as significant anxiety and depressive symptomatology.  We have set the patient up for therapeutic interventions going forward.   There is can to be some delay between now and the first available appointment for the patient but I will remain available if there are any acute exacerbations of her status while we are waiting for those available appointments.  Diagnosis:    Cognitive and behavioral changes  History of CVA (cerebrovascular accident)  MS (multiple sclerosis) ([place])  Depression with anxiety         Electronically Signed   _______________________ Ilean Skill, Psy.D. Clinical Neuropsychologist

## 2021-05-17 NOTE — Telephone Encounter (Signed)
Patient is calling to see if Dr.Scott can send her referral to [facility] physical therapy for her arm and leg.Please see note below.

## 2021-05-17 NOTE — Telephone Encounter (Signed)
Attempted to call pt. Pt did not answer. Left voicemail informing her that everything is getting straightened out and she should be receiving a call soon about therapy and to call the clinic back with any questions.

## 2021-05-17 NOTE — Telephone Encounter (Signed)
Please call Carly [name] and let her know that I have talked with therapy and we are getting everything straightened out.  This should not be an issue with doing her both therapy''s - as long as she is not receiving home health.

## 2021-05-19 NOTE — Telephone Encounter (Signed)
The order has been placed for occupation therapy - this will be therapy for her hand/arm.  Someone should be calling her with an appt.  She was also to receive therapy for her leg - lymphedema.  Has she been notified of appt for this.  If not, please call [phone] and see what we need to do to get this scheduled.  (Carly [name] had previously talked to Stephani Police with [facility] outpatient therapy/rehab)

## 2021-05-21 NOTE — Telephone Encounter (Signed)
Spoke with patient to let her know that the only way for her to do therapy on both her arm and leg is to have both referrals sent to [facility]. This is what she is wanting to do. Patient is agreeable [facility] at [facility] and [facility] with [facility]. Will place new referrals and d/c therapy at [facility].

## 2021-05-22 NOTE — Telephone Encounter (Signed)
Spoke with [facility]- cancelled therapy. Placed new PT and OT referrals to be done at [facility]. Spoke with patient to let her know. She is supposed to call me next week if she has not heard anything about scheduling

## 2021-05-23 ENCOUNTER — Other Ambulatory Visit: Payer: Self-pay | Admitting: Internal Medicine

## 2021-05-24 NOTE — Telephone Encounter (Signed)
Pt called you about therapy. Please return her call

## 2021-05-24 NOTE — Telephone Encounter (Signed)
Patient was just calling to make sure that she is going to get therapy on her hand and leg. Patient is waiting to hear from them about scheduling. Has f/u appt on 8/31 with PCP

## 2021-05-28 ENCOUNTER — Telehealth: Payer: Self-pay | Admitting: Internal Medicine

## 2021-05-28 DIAGNOSIS — R490 Dysphonia: Secondary | ICD-10-CM

## 2021-05-28 DIAGNOSIS — R053 Chronic cough: Secondary | ICD-10-CM

## 2021-05-28 NOTE — Telephone Encounter (Signed)
Patient wanted to speak with Carly [name] because she a snot heard from the PT at [facility] as to scheduling her physical therapy.  Ask patient if she wanted a sooner appointment for the ongoing hoarseness and cough se ha shad for 6 months patient declined sooner appointment she was just wanting a chest x-ray to rule out family history of fibrosis of the lungs due to the hoarseness she has had for off an don 6 months she does not want sooner appointment.

## 2021-05-28 NOTE — Telephone Encounter (Signed)
Patient informed, Due to the high volume of calls and your symptoms we have to forward your call to our Triage Nurse to expedient your call. Please hold for the transfer.  Patient transferred to Access Nurse. Due to having a cough for the past week and feeling that she needs a chest x-ray.No appointments available in office or virtual.

## 2021-05-28 NOTE — Telephone Encounter (Signed)
Patient called and would like to speak to [place].

## 2021-05-28 NOTE — Telephone Encounter (Addendum)
Left message to return call to office.

## 2021-05-28 NOTE — Telephone Encounter (Signed)
Patient wants a call from [place].

## 2021-05-28 NOTE — Telephone Encounter (Signed)
Patient was sent to Access Nurse to be triaged,patient called back and stated she does not have time for Access Nurse.Patient only wants to speak to [place] and she was unavailable at the time of the call.

## 2021-05-28 NOTE — Telephone Encounter (Signed)
Reason for Triage Call /in person:raspy voice that keeps coming back. Patient says talking with her voice causes her to cough , non productive. No fever, no chills,denies any other symptoms.  Symptoms:Mild patient has taken 4 Covid test this month all negative  Onset: 6 months   Duration: 6 months  Medications:Coricidin help but keeps coming back. Patient stated PCP advised the Coricidin and that aware this has been ongoing.   Last seen for this problem: No visit for this DX noted in chart.    Patien tasking should she have Chest X-ray and would like visit with Dr. Nicki Reaper only.

## 2021-05-29 DIAGNOSIS — R059 Cough, unspecified: Secondary | ICD-10-CM | POA: Diagnosis not present

## 2021-05-29 DIAGNOSIS — J019 Acute sinusitis, unspecified: Secondary | ICD-10-CM | POA: Diagnosis not present

## 2021-05-29 NOTE — Telephone Encounter (Signed)
I have placed order for cxr.  Please schedule.  Also, the reason for the delay, is we had to confirm she was not getting therapy in two different places.  I reviewed referral notes.  Please confirm with [facility] rehab - if have needed information.  It appears there is a delay in lymphedema therapy due to wait list, but need to clarify she needs both lymphedema and hand/arm therapy s/p CVA.

## 2021-05-29 NOTE — Telephone Encounter (Signed)
Patient called about note below, would like to speak to [place].

## 2021-05-30 NOTE — Telephone Encounter (Signed)
Patient saw Dr Richardson Landry and was put on prednisone. Therapy has been set up and patient has follow up on 06/12/21. Confirmed patient is doing ok. Will hold on cxr for right now.

## 2021-05-31 ENCOUNTER — Other Ambulatory Visit: Payer: Self-pay

## 2021-05-31 ENCOUNTER — Encounter: Payer: Self-pay | Admitting: Physical Medicine and Rehabilitation

## 2021-05-31 ENCOUNTER — Encounter (HOSPITAL_BASED_OUTPATIENT_CLINIC_OR_DEPARTMENT_OTHER): Payer: Medicare Other | Admitting: Physical Medicine and Rehabilitation

## 2021-05-31 VITALS — BP 120/80 | HR 90 | Temp 97.9°F | Ht 63.0 in | Wt 187.0 lb

## 2021-05-31 DIAGNOSIS — F418 Other specified anxiety disorders: Secondary | ICD-10-CM | POA: Diagnosis not present

## 2021-05-31 DIAGNOSIS — I890 Lymphedema, not elsewhere classified: Secondary | ICD-10-CM | POA: Diagnosis not present

## 2021-05-31 DIAGNOSIS — G35 Multiple sclerosis: Secondary | ICD-10-CM | POA: Diagnosis not present

## 2021-05-31 DIAGNOSIS — Z8673 Personal history of transient ischemic attack (TIA), and cerebral infarction without residual deficits: Secondary | ICD-10-CM | POA: Diagnosis not present

## 2021-05-31 DIAGNOSIS — F411 Generalized anxiety disorder: Secondary | ICD-10-CM | POA: Diagnosis not present

## 2021-05-31 DIAGNOSIS — R4689 Other symptoms and signs involving appearance and behavior: Secondary | ICD-10-CM | POA: Diagnosis not present

## 2021-05-31 DIAGNOSIS — R4189 Other symptoms and signs involving cognitive functions and awareness: Secondary | ICD-10-CM | POA: Diagnosis not present

## 2021-05-31 MED ORDER — QUETIAPINE FUMARATE 50 MG PO TABS: 50.0000 mg | ORAL_TABLET | Freq: Two times a day (BID) | ORAL | 3 refills | Status: DC

## 2021-05-31 MED ORDER — FUROSEMIDE 40 MG PO TABS: 40.0000 mg | ORAL_TABLET | Freq: Every day | ORAL | 3 refills | Status: DC

## 2021-05-31 MED ORDER — MIRTAZAPINE 15 MG PO TBDP: 15.0000 mg | ORAL_TABLET | Freq: Every day | ORAL | 2 refills | Status: DC

## 2021-05-31 NOTE — Patient Instructions (Signed)
Anxiety: -Discussed exercise and meditation as tools to decrease anxiety. -Recommended Down Dog Yoga app -Discussed spending time outdoors. -Discussed positive re-framing of anxiety.  -Discussed the following foods that have been show to reduce anxiety: 1) [place] nuts, mushrooms, soy beans due to their high selenium content. Upper limit of toxicity of selenium is 435mg/day so no more than 3-4 bBolivianuts per day.  2) Fatty fish such as salmon, mackerel, sardines, trout, and herring- high in omega-3 fatty acids 3) Eggs- increases serotonin and dopamine 4) Pumpkin seeds- high in omega-3 fatty acids 5) dark chocolate- high in flavanols that increase blood flow to brain 6) turmeric- take with black pepper to increase absorption 7) chamomile tea- antioxidant and anti-inflammatory properties 8) yogurt without sugar- supports gut-brain axis 9) green tea- contains L- theanine 10) blueberries- high in vitamin C and antioxidants 11) [place] high in tryptophan which gets converted to serotonin 12) bell peppers- rich in vitamin C and antioxidants 13) citrus fruits- rich in vitamin C and antioxidants 14) almonds- high in vitamin E and healthy fats 15) chia seeds- high in omega-3 fatty acids

## 2021-05-31 NOTE — Progress Notes (Signed)
Subjective:    Patient ID: EMMERI BREI, female    DOB: 1951/09/19, 70 y.o.   MRN: [id number]  HPI   Mrs. Bartholf is a 70 year old woman who was admitted to CIR with white matter periventricular CVA, presents for follow-up regarding anxiety   1) Anxiety:  -her psychiatrist transitioned her from Seroquel back to benzodiazepine due to side effect profile of Seroquel. -since she stopped the Seroquel, her anxiety has increased, and she doesn't know what to do. -she and husband [name] were scared of side effects of Seroquel that were discussed  -she has been sleeping but her legs feel very stiff at night -she continues to take Cymbalta '60mg'$  and [name] '45mg'$  as well. -she discussed with her husband, who is also present on this call today, and she would prefer to restart Seroquel and stop Lorazepam.  -she has not had any counseling and would like to restart this. She has an appointment with [name] in August and would like to start counseling earlier than this if possible.  -she loved speaking with [name] inpatient and would like to follow with him -she had symptoms of dizziness yesterday at the hairdresser and was asked to contact me regarding if this could be from the seroquel. She had had no other similar episodes since starting it.  -she has been sleeping well with the Seroquel at night.  -she would like a list of foods that can help anxiety.  -she has been using essential oil.  -the prednisone has caused weight gain.   2) MS: -has been having a lot of stiffness recently -EMS called twice due to difficulty getting her up off the floor -is receiving home therapy   3) Lymphedema  -has significant bilateral lower extremity  -it has been increasing and her weight goes up. -lasix '20mg'$  did not help enough.   4) HTN: SBP is well controlled today.    Pain Inventory Average Pain 0 Pain Right Now 0 My pain is  no pain  In the last 24 hours, has pain interfered  with the following? General activity  n/a Relation with others  n/a Enjoyment of life  n/a What TIME of day is your pain at its worst? varies Sleep (in general) Good  Pain is worse with: unsure Pain improves with:  n/a Relief from Meds:  n/a  Family History  Problem Relation Age of Onset   Arthritis Mother    Hypertension Mother    Macular degeneration Mother    Hypertension Father    Hyperlipidemia Father    Heart disease Maternal Grandfather    Diabetes Maternal Grandfather    Kidney disease Paternal Grandmother    Social History   Socioeconomic History   Marital status: Married    Spouse name: Not on file   Number of children: Not on file   Years of education: Not on file   Highest education level: Not on file  Occupational History   Not on file  Tobacco Use   Smoking status: Never   Smokeless tobacco: Never  Vaping Use   Vaping Use: Never used  Substance and Sexual Activity   Alcohol use: No    Alcohol/week: 0.0 standard drinks   Drug use: No   Sexual activity: Not Currently  Other Topics Concern   Not on file  Social History Narrative   Married    Social Determinants of Health   Financial Resource Strain: Low Risk    Difficulty of Paying Living Expenses:  Not hard at all  Food Insecurity: No Food Insecurity   Worried About Charity fundraiser in the Last Year: Never true   Ran Out of Food in the Last Year: Never true  Transportation Needs: No Transportation Needs   Lack of Transportation (Medical): No   Lack of Transportation (Non-Medical): No  Physical Activity: Not on file  Stress: No Stress Concern Present   Feeling of Stress : Not at all  Social Connections: Unknown   Frequency of Communication with Friends and Family: Not on file   Frequency of Social Gatherings with Friends and Family: Not on file   Attends Religious Services: Not on file   Active Member of Clubs or Organizations: Not on file   Attends Archivist Meetings: Not on  file   Marital Status: Married   Past Surgical History:  Procedure Laterality Date   BACK SURGERY     CHOLECYSTECTOMY     COLONOSCOPY WITH PROPOFOL N/A 05/18/2017   Procedure: COLONOSCOPY WITH PROPOFOL;  Surgeon: [name], MD;  Location: [facility] ENDOSCOPY;  Service: Endoscopy;  Laterality: N/A;   FOOT SURGERY  2015   GALLBLADDER SURGERY  2008   HARDWARE REMOVAL Left 02/14/2016   Procedure: LEFT FOOT REMOVAL DEEP IMPLANT;  Surgeon: [name], MD;  Location: [place];  Service: Orthopedics;  Laterality: Left;   HERNIA REPAIR     Inguinal Hernia Repair   SPINE SURGERY  2014   Past Surgical History:  Procedure Laterality Date   BACK SURGERY     CHOLECYSTECTOMY     COLONOSCOPY WITH PROPOFOL N/A 05/18/2017   Procedure: COLONOSCOPY WITH PROPOFOL;  Surgeon: [name], MD;  Location: [facility] ENDOSCOPY;  Service: Endoscopy;  Laterality: N/A;   FOOT SURGERY  2015   GALLBLADDER SURGERY  2008   HARDWARE REMOVAL Left 02/14/2016   Procedure: LEFT FOOT REMOVAL DEEP IMPLANT;  Surgeon: [name], MD;  Location: [place];  Service: Orthopedics;  Laterality: Left;   HERNIA REPAIR     Inguinal Hernia Repair   SPINE SURGERY  2014   Past Medical History:  Diagnosis Date   Allergy    Anxiety    Aspiration pneumonia (HCC)    Depression    Frequent headaches    H/O   GERD (gastroesophageal reflux disease)    History of chicken pox    History of colon polyps    Hx of migraines    Multiple sclerosis (HCC)    PONV (postoperative nausea and vomiting)    BP 120/80   Pulse 90   Temp 97.9 F (36.6 C)   Ht '5\' 3"'$  (1.6 m)   Wt 187 lb (84.8 kg) Comment: according to VS record this is up from 167 lb 3 weeks ago  SpO2 97%   BMI 33.13 kg/m   Opioid Risk Score:   Fall Risk Score:  `1  Depression screen PHQ 2/9  Depression screen [facility] 2/9 05/31/2021 03/28/2021 11/02/2020 07/12/2019 03/14/2019 03/10/2017  Decreased Interest 3 0 0 0 0 1  Down, Depressed, Hopeless 3  0 0 0 0 1  PHQ - 2 Score 6 0 0 0 0 2  Altered sleeping - 0 - 0 0 0  Tired, decreased energy - 0 - 0 0 0  Change in appetite - 0 - 0 0 0  Feeling bad or failure about yourself  - 0 - 0 0 0  Trouble concentrating - 0 - 0 0 0  Moving slowly or  fidgety/restless - 0 - 0 0 0  Suicidal thoughts - 0 - 0 0 0  PHQ-9 Score - 0 - 0 0 2  Difficult doing work/chores - Not difficult at all - Not difficult at all Not difficult at all Very difficult  Some recent data might be hidden    Review of Systems  Constitutional:  Positive for unexpected weight change.       Weight gain= 20 lbs in 3 weeks  HENT: Negative.    Eyes: Negative.   Respiratory:  Positive for cough.        Respiratory infection  Cardiovascular:  Positive for leg swelling.  Gastrointestinal:  Positive for nausea.  Endocrine:       High blood sugars (taking prednisone dosepk)  Genitourinary: Negative.   Musculoskeletal:  Positive for gait problem, neck pain and neck stiffness.  Skin: Negative.   Allergic/Immunologic: Negative.   Hematological:  Bruises/bleeds easily.       Plavix  Psychiatric/Behavioral:  Positive for dysphoric mood. The patient is nervous/anxious.   All other systems reviewed and are negative.     Objective:   Physical Exam Gen: no distress, normal appearing HEENT: oral mucosa pink and moist, NCAT Cardio: Reg rate Chest: normal effort, normal rate of breathing Abd: soft, non-distended Ext: no edema Psych: pleasant, normal affect Skin: intact Neuro: Alert and oriented x3 Musculoskeletal: 5/5 strength throughout     Assessment & Plan:  1) Anxiety: -discussed with patient the side effects of both Seroquel and Lorazepam, as well as potential interactions with other medications. -advised her to discuss with Bill the risks and benefits of both medications.  -discussed that we can follow-up tomorrow after their conversation to see what they decide -continue to encourage non-pharmacologic management  approaches as well, such as meditation, applying lavender oil, and exercise- all of which she has been trying and which have been helping.  -discussed alternative anxiety medications.  -continue seroquel 12.'5mg'$  daily and '25mg'$  at night. Can decreased to '25mg'$  at night and assess positive/negative effects during the day. Can restart 1/2 tab during the day if anxiety is uncontrolled.  -discontinue lorazepam.  -establish care with [name] for neuropsych counseling on 8/2. They really valued the appointment with him.   2) MS -continue home therapy to minimize stiffness/maximize mobility -f/u with MS specialist.   3) HTN: BP has been 130s-150s/80s, recommended citrus foods and nuts, as much mobility as she can tolerate, log Bps daily and bring log to f/u appointment. If remains 0000000 systolic at this visit, can consider increasing Norvasc to '10mg'$ .  -continue current regimen and checking and checking BP.   4) Bilateral lower extremity edema: - referred for lymphedema therapy -discussed response to Lasix. Increase Lasix to '40mg'$ .

## 2021-06-06 ENCOUNTER — Telehealth: Payer: Self-pay | Admitting: Internal Medicine

## 2021-06-06 NOTE — Telephone Encounter (Signed)
Reviewed therapy's response.  D/w her at 06/12/21 appt.

## 2021-06-06 NOTE — Telephone Encounter (Signed)
Please make sure to put on schedule to discuss PT with pt.  See note from therapy.

## 2021-06-06 NOTE — Telephone Encounter (Signed)
-----   Message from Ian Bushman sent at 06/06/2021  3:33 PM EDT ----- Regarding: PT order needed Patient called the office and stated she needed to also have a PT order for walking/balance issues.  There was a lot of confusion as to what she actually needed.  There was an OT order for lymphedema, OT order for hand therapy already.  We have her scheduled for the lymphedema and once her treatments are done with that we can schedule her hand OT.  If you think that the patient can benefit from PT for walking and gait issues please enter a PT order.    Thank you !  [place]

## 2021-06-06 NOTE — Telephone Encounter (Signed)
Patient calling in and states that she has been scheduled but they state that they will not be addressing everything at the same time.   Patient's currently scheduled for two appointments, they will only address the arm alone and then the lymphedema alone.

## 2021-06-07 DIAGNOSIS — G35 Multiple sclerosis: Secondary | ICD-10-CM | POA: Diagnosis not present

## 2021-06-07 DIAGNOSIS — G8110 Spastic hemiplegia affecting unspecified side: Secondary | ICD-10-CM | POA: Diagnosis not present

## 2021-06-07 DIAGNOSIS — R29898 Other symptoms and signs involving the musculoskeletal system: Secondary | ICD-10-CM | POA: Diagnosis not present

## 2021-06-07 NOTE — Telephone Encounter (Signed)
Patient informed and verbalized understanding

## 2021-06-07 NOTE — Telephone Encounter (Signed)
noted 

## 2021-06-12 ENCOUNTER — Other Ambulatory Visit: Payer: Self-pay

## 2021-06-12 ENCOUNTER — Ambulatory Visit: Payer: Medicare Other | Attending: Internal Medicine | Admitting: Occupational Therapy

## 2021-06-12 ENCOUNTER — Other Ambulatory Visit: Payer: Self-pay | Admitting: Internal Medicine

## 2021-06-12 ENCOUNTER — Encounter: Payer: Medicare Other | Admitting: Occupational Therapy

## 2021-06-12 ENCOUNTER — Encounter: Payer: Self-pay | Admitting: Occupational Therapy

## 2021-06-12 ENCOUNTER — Telehealth (INDEPENDENT_AMBULATORY_CARE_PROVIDER_SITE_OTHER): Payer: Medicare Other | Admitting: Internal Medicine

## 2021-06-12 DIAGNOSIS — I639 Cerebral infarction, unspecified: Secondary | ICD-10-CM

## 2021-06-12 DIAGNOSIS — R739 Hyperglycemia, unspecified: Secondary | ICD-10-CM

## 2021-06-12 DIAGNOSIS — R600 Localized edema: Secondary | ICD-10-CM

## 2021-06-12 DIAGNOSIS — F339 Major depressive disorder, recurrent, unspecified: Secondary | ICD-10-CM | POA: Diagnosis not present

## 2021-06-12 DIAGNOSIS — Z8673 Personal history of transient ischemic attack (TIA), and cerebral infarction without residual deficits: Secondary | ICD-10-CM | POA: Diagnosis not present

## 2021-06-12 DIAGNOSIS — I10 Essential (primary) hypertension: Secondary | ICD-10-CM | POA: Diagnosis not present

## 2021-06-12 DIAGNOSIS — G35 Multiple sclerosis: Secondary | ICD-10-CM

## 2021-06-12 DIAGNOSIS — I890 Lymphedema, not elsewhere classified: Secondary | ICD-10-CM | POA: Diagnosis not present

## 2021-06-12 DIAGNOSIS — E7800 Pure hypercholesterolemia, unspecified: Secondary | ICD-10-CM | POA: Diagnosis not present

## 2021-06-12 MED ORDER — DOXYCYCLINE HYCLATE 100 MG PO TABS: 100.0000 mg | ORAL_TABLET | Freq: Two times a day (BID) | ORAL | 0 refills | Status: DC

## 2021-06-12 NOTE — Progress Notes (Signed)
Patient ID: Carly [name], female   DOB: 01-14-1951, 70 y.o.   MRN: [id number]   Virtual Visit via video Note  This visit type was conducted due to national recommendations for restrictions regarding the COVID-19 pandemic (e.g. social distancing).  This format is felt to be most appropriate for this patient at this time.  All issues noted in this document were discussed and addressed.  No physical exam was performed (except for noted visual exam findings with Video Visits).   I connected with [name] by a video enabled telemedicine application and verified that I am speaking with the correct person using two identifiers. Location patient: home Location provider: work  Persons participating in the virtual visit: patient, provider  The limitations, risks, security and privacy concerns of performing an evaluation and management service by video and the availability of in person appointments have been discussed.  It has also been discussed with the patient that there may be a patient responsible charge related to this service. The patient expressed understanding and agreed to proceed.   Reason for visit:  scheduled follow up.   HPI: Follow up regarding her swelling, stress and recent congestion.  Was seen 8/17 - ENT. Treated with doxycycline, prednisone and tessalon perles.  No fever.  Nasal fullness.  Hoarseness.  No sore throat.  Increased post nasal drainage.  No chest congestion or sob.  No chest pain or tightness.  Took all of the doxycyline but one tablet.  Reports GI issues.  Was taking prednisone as well.  She is feeling better, but still with symptoms.  Seeing psychiatry.  Recommend adjusting dose remeron and seroquel.  Overall stable.  Blood pressures (outside checks) - averaging 111-130s/80s.  Eating.  No vomiting.  Bowels moving.  No diarrhea.     ROS: See pertinent positives and negatives per HPI.  Past Medical History:  Diagnosis Date   Allergy    Anxiety     Aspiration pneumonia (HCC)    Depression    Frequent headaches    H/O   GERD (gastroesophageal reflux disease)    History of chicken pox    History of colon polyps    Hx of migraines    Multiple sclerosis (HCC)    PONV (postoperative nausea and vomiting)     Past Surgical History:  Procedure Laterality Date   BACK SURGERY     CHOLECYSTECTOMY     COLONOSCOPY WITH PROPOFOL N/A 05/18/2017   Procedure: COLONOSCOPY WITH PROPOFOL;  Surgeon: [name], MD;  Location: [facility] ENDOSCOPY;  Service: Endoscopy;  Laterality: N/A;   FOOT SURGERY  2015   GALLBLADDER SURGERY  2008   HARDWARE REMOVAL Left 02/14/2016   Procedure: LEFT FOOT REMOVAL DEEP IMPLANT;  Surgeon: [name], MD;  Location: [place];  Service: Orthopedics;  Laterality: Left;   HERNIA REPAIR     Inguinal Hernia Repair   SPINE SURGERY  2014    Family History  Problem Relation Age of Onset   Arthritis Mother    Hypertension Mother    Macular degeneration Mother    Hypertension Father    Hyperlipidemia Father    Heart disease Maternal Grandfather    Diabetes Maternal Grandfather    Kidney disease Paternal Grandmother     SOCIAL HX: reviewed   Current Outpatient Medications:    doxycycline (VIBRA-TABS) 100 MG tablet, Take 1 tablet (100 mg total) by mouth 2 (two) times daily., Disp: 14 tablet, Rfl: 0   acetaminophen (TYLENOL) 500 MG tablet,  Take 1 tablet (500 mg total) by mouth every 4 (four) hours as needed., Disp: 30 tablet, Rfl: 0   amLODipine (NORVASC) 5 MG tablet, TAKE 1 TABLET BY MOUTH DAILY., Disp: 90 tablet, Rfl: 1   Baclofen 5 MG TABS, Take 1 tablet by mouth 3 (three) times daily., Disp: , Rfl:    benzonatate (TESSALON) 100 MG capsule, Take 100 mg by mouth every 8 (eight) hours as needed., Disp: , Rfl:    Cholecalciferol (D3-1000 PO), Take 2,000 Units by mouth daily., Disp: , Rfl:    clopidogrel (PLAVIX) 75 MG tablet, TAKE 1 TABLET (75 MG) BY MOUTH EVERY DAY, Disp: 30 tablet, Rfl: 0    Coenzyme Q10 (CO Q10) 100 MG CAPS, Take 1 capsule by mouth daily., Disp: , Rfl:    dalfampridine 10 MG TB12, Take 10 mg by mouth every 12 (twelve) hours., Disp: , Rfl:    docusate sodium (COLACE) 100 MG capsule, Take 100 mg by mouth 2 (two) times daily., Disp: , Rfl:    DULoxetine (CYMBALTA) 60 MG capsule, TAKE 1 CAPSULE BY MOUTH DAILY., Disp: 90 capsule, Rfl: 1   fexofenadine (ALLEGRA ALLERGY) 180 MG tablet, Take 1 tablet (180 mg total) by mouth daily., Disp: 30 tablet, Rfl: 2   fluticasone (FLONASE) 50 MCG/ACT nasal spray, Place 1 spray into both nostrils daily., Disp: 16 g, Rfl: 2   furosemide (LASIX) 40 MG tablet, Take 1 tablet (40 mg total) by mouth daily., Disp: 30 tablet, Rfl: 3   ketoconazole (NIZORAL) 2 % cream, SMARTSIG:1 Application Topical Morning-Night, Disp: , Rfl:    losartan (COZAAR) 50 MG tablet, Take 1 tablet (50 mg total) by mouth daily., Disp: 90 tablet, Rfl: 1   magnesium oxide (MAG-OX) 400 MG tablet, TAKE 1 TABLET BY MOUTH DAILY, Disp: 30 tablet, Rfl: 5   METAMUCIL FIBER PO, Take by mouth 2 (two) times daily., Disp: , Rfl:    mirtazapine (REMERON SOL-TAB) 15 MG disintegrating tablet, Take 1 tablet (15 mg total) by mouth at bedtime., Disp: 30 tablet, Rfl: 2   nystatin cream (MYCOSTATIN), Apply 1 application topically 2 (two) times daily., Disp: 30 g, Rfl: 0   omeprazole (PRILOSEC) 20 MG capsule, Take 20 mg by mouth 2 (two) times daily before a meal., Disp: , Rfl:    QUEtiapine (SEROQUEL) 50 MG tablet, Take 1 tablet (50 mg total) by mouth 2 (two) times daily., Disp: 60 tablet, Rfl: 3   rosuvastatin (CRESTOR) 5 MG tablet, Take 1 tablet (5 mg total) by mouth daily., Disp: 90 tablet, Rfl: 1   saccharomyces boulardii (FLORASTOR) 250 MG capsule, Take 1 capsule (250 mg total) by mouth 2 (two) times daily., Disp: 60 capsule, Rfl: 0   sodium chloride (OCEAN) 0.65 % nasal spray, Place 1-2 sprays into the nose as needed., Disp: , Rfl:  No current facility-administered medications for this  visit.  Facility-Administered Medications Ordered in Other Visits:    0.9 %  sodium chloride infusion, , Intravenous, Continuous, Corcoran, Melissa C, MD, Last Rate: 10 mL/hr at 04/25/16 0855, New Bag at 04/25/16 0855   acetaminophen (TYLENOL) tablet 650 mg, 650 mg, Oral, Once, Corcoran, Melissa C, MD  EXAM:  GENERAL: alert, oriented, appears well and in no acute distress  HEENT: atraumatic, conjunttiva clear, no obvious abnormalities on inspection of external nose and ears  NECK: normal movements of the head and neck  LUNGS: on inspection no signs of respiratory distress, breathing rate appears normal, no obvious gross SOB, gasping or wheezing  CV: no  obvious cyanosis  PSYCH/NEURO: pleasant and cooperative, no obvious depression or anxiety, speech and thought processing grossly intact  ASSESSMENT AND PLAN:  Discussed the following assessment and plan:  Problem List Items Addressed This Visit     Depression, recurrent ([place])    Followed by psychiatry.  On seroquel and remeron.  Follow.       History of CVA (cerebrovascular accident)    Increased stress related to recent stroke.  Continue risk factor modification.  Follow.       Hypercholesterolemia    She appears to be tolerating the Crestor daily.  Follow-up fast lipid profile and liver function testing.        Hyperglycemia    Low-carb diet and exercise.  Follow metabolic panel and [id number].      Hypertension    Blood pressure as outlined.  Continue losartan and amlodipine.  Follow pressures.  Follow metabolic panel.       Lower extremity edema    PT - for treatment lymphedema.  Discussed lymphedema pump.  Swelling has been better.        Multiple sclerosis ([place])    Followed by neurology.        Return in about 4 weeks (around 07/10/2021) for follow up appt (67mn).   I discussed the assessment and treatment plan with the patient. The patient was provided an opportunity to ask questions and all were answered. The  patient agreed with the plan and demonstrated an understanding of the instructions.   The patient was advised to call back or seek an in-person evaluation if the symptoms worsen or if the condition fails to improve as anticipated.    CEinar Pheasant MD

## 2021-06-13 ENCOUNTER — Other Ambulatory Visit: Payer: Self-pay | Admitting: Internal Medicine

## 2021-06-13 DIAGNOSIS — G35 Multiple sclerosis: Secondary | ICD-10-CM | POA: Diagnosis not present

## 2021-06-13 NOTE — Therapy (Signed)
[place] MAIN [facility] SERVICES [address] [place], [place], [address] Phone: [phone]   Fax:  [phone]  Occupational Therapy Evaluation  Patient Details  Name: Carly [name] MRN: [id number] Date of Birth: 05/13/1951 Referring Provider (OT): Einar Pheasant, MD   Encounter Date: 06/12/2021   OT End of Session - 06/13/21 0846     Visit Number 1    Number of Visits 36    Date for OT Re-Evaluation 09/11/21    Authorization Type Initial eval 06/12/21    OT Start Time 0305    OT Stop Time 0420    OT Time Calculation (min) 75 min    Activity Tolerance Other (comment);Patient tolerated treatment well   limited by lability   Behavior During Therapy [facility] for tasks assessed/performed;Anxious   tearful            Past Medical History:  Diagnosis Date   Allergy    Anxiety    Aspiration pneumonia (HCC)    Depression    Frequent headaches    H/O   GERD (gastroesophageal reflux disease)    History of chicken pox    History of colon polyps    Hx of migraines    Multiple sclerosis (HCC)    PONV (postoperative nausea and vomiting)     Past Surgical History:  Procedure Laterality Date   BACK SURGERY     CHOLECYSTECTOMY     COLONOSCOPY WITH PROPOFOL N/A 05/18/2017   Procedure: COLONOSCOPY WITH PROPOFOL;  Surgeon: [name], MD;  Location: [facility] ENDOSCOPY;  Service: Endoscopy;  Laterality: N/A;   FOOT SURGERY  2015   GALLBLADDER SURGERY  2008   HARDWARE REMOVAL Left 02/14/2016   Procedure: LEFT FOOT REMOVAL DEEP IMPLANT;  Surgeon: [name], MD;  Location: [place];  Service: Orthopedics;  Laterality: Left;   HERNIA REPAIR     Inguinal Hernia Repair   SPINE SURGERY  2014    There were no vitals filed for this visit.   Subjective Assessment - 06/12/21 1524     Subjective  Carly [name] is referred to Occupational Therapy bu Einar Pheasant, MD for evaluation and treatment of BLE lymphedema. Mrs.  [name] is accompanied by her husband , Carly [name], seated in a transport wheelchair. She is wearing sheer compression stocking. Pt reports onset of B leg swelling many years ago, but it has gotten worse over the last year. Pt denies known  family history of lympedema. She has not undergone lymphedema treatment previously.  We discussed the benefits of power seating for independent leg elevation throughout the day,  which is essential for loptimal ymphedema control when muscle weakness due to MS is an issue.  Patient and spouse tell me they had a power wc evaluation earlier this week, but patient became very emotional during the discussion as she was expecting a scooter.    Patient is accompanied by: Family member    Pertinent History Relevant to limb swelling: Depression /anxiety, MS, HTN. 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 weakness,    Special Tests Intake FOTO: 29/100 (functional outcome score)    Patient Stated Goals to be able to walk better, to get this swelling down. I hate it.    Currently in Pain? Yes    Pain Location Leg    Pain Orientation Right;Left  L>R   Pain Descriptors / Indicators Discomfort;Headache;Heaviness;Numbness;Pressure;Spasm;Tightness   fullness   Pain Type Chronic pain    Pain Onset Other (comment)   leg pain and swelling first noticed during long care trip ~ 7 hours   Pain Frequency Intermittent    Aggravating Factors  salt, hot weather, dependent positiong,    Pain Relieving Factors elevation    Effect of Pain on Daily Activities leg swelling and associated pain limits functional ambulation and mobility, limits ability to drive and ride in a car, limits ability to fit LB clothing and shoes, limits ability to complete exercise program, increases anxiety, impairs body image    Multiple Pain  Sites Yes    Pain Location Back               [facility] OT Assessment - 06/13/21 0827       Assessment   Medical Diagnosis Mild, stage II, BLE Lymphedema 2/2 progressive muscle weakness and dependent positioning    Referring Provider (OT) Einar Pheasant, MD    Onset Date/Surgical Date --   "many years, but worse since CVA in 2/22   Prior Therapy non-medical grade compression knee highs. No CDT      Precautions   Precautions Fall    Precaution Comments impaired insight into safety      Balance Screen   Has the patient fallen in the past 6 months Yes    How many times? 2   "yes but it really didnt hurt"   Has the patient had a decrease in activity level because of a fear of falling?  Yes    Is the patient reluctant to leave their home because of a fear of falling?  No      Home  Environment   Family/patient expects to be discharged to: Private residence    Living Arrangements Spouse/significant other    Available Help at Discharge Family    Type of [place] Access Other (Comment)   5 steps to enter front; ramped back door entrance   [place] One level    Alternate Level Stairs - Number of Steps bonus room up single flight. Pt unable to access    [employer] Yes    [place] - 4 wheels;Wheelchair - [employer] - 2 wheels;Shower seat      Prior Function   Level of Independence Needs assistance with transfers;Needs assistance with gait;Needs assistance with homemaking;Needs assistance with ADLs;Requires assistive device for independence    Vocation Retired    Museum/gallery curator    Leisure read, travel, beach      IADL   Prior Level of Function Shopping Max A    Shopping Needs to be accompanied on any shopping trip    Prior Level of Function Light Housekeeping dependent    Light Housekeeping Does not participate in any housekeeping tasks    Prior Level of  Function Meal Prep Max A    Meal Prep Needs to have meals prepared and served    Prior Level of Software engineer Relies on family or friends for transportation      Mobility   Mobility Status History of falls;Needs assist    Mobility Status Comments anxiety re power mobility. States this represents a loss of function for her      Written Expression   Dominant Hand Right  Vision - History   Baseline Vision Wears glasses all the time    Visual History Other (comment)   MS     Activity Tolerance   Activity Tolerance --   impaired   Activity Tolerance Comments limited by leg swelling and associated pain., limited by anxiety      Cognition   Overall Cognitive Status History of cognitive impairments - at baseline    Problem Solving Impaired    Behaviors Lability      Observation/Other Assessments   Observations BLE swelling, L>R, with fwwt involved. Primarily distributed from knees to toes, 2+ pitting; skin mildly dry and flaking.Redness absent. No hemosiderin staining. No apparent varicosities. Color WNL. Cool temp to palpation    Skin Integrity no signs/ symptoms of infection    Focus on Therapeutic Outcomes (FOTO)  Intake 29/100    Outcome Measures comparative limb volumetrics TBA initial Rx visit. Estimate swelling > 15% above baseline.      Posture/Postural Control   Posture/Postural Control Postural limitations    Postural Limitations Forward head;Posterior pelvic tilt;Flexed trunk;Increased thoracic kyphosis      Sensation   Light Touch Appears Intact      Coordination   Tremors wearing 1/2# weights on wrists bilaterally for strengthening, not to control tremors      Hand Function   Right Hand Gross Grasp Functional    Right Hand Grip (lbs) impaired hand strength bilaterally    Left Hand Gross Grasp Functional              LYMPHEDEMA/ONCOLOGY QUESTIONNAIRE - 06/13/21 0001       What other symptoms do you have    Is it Hard or Difficult finding clothes that fit Yes    Do you have infections No    Stemmer Sign Yes      Lymphedema Assessments   Lymphedema Assessments Lower extremities                    OT Treatments/Exercises (OP) - 06/13/21 0001       Transfers   Transfers Sit to Stand;Stand to Sit      ADLs   Overall ADLs needs assistance with most basic and instrumental ADLs    Grooming impaired for hair, max A for nails    UB Dressing needs assist    LB Dressing Max A    Toileting some assist    Bathing Mod A    Functional Mobility Mod-max A    Cooking dependent    Home Maintenance dependent    Writing impaired    Driving dependent    Work unable    Leisure impaired    ADL Education Given Yes      Manual Therapy   Manual Therapy Edema management                        OT Long Term Goals - 06/13/21 0900       OT LONG TERM GOAL #1   Title Given this patient's risk-adjustment variables, her Intake Functional Status score of 29/100 on the FOTO tool, patient will experience at least an increase in function of 3 points for a score of 32/100.    Baseline 97/100    Time 12    Period Weeks    Status New    Target Date 09/11/21      OT LONG TERM GOAL #2   Title Pt will be able to verbalize at least 4  lymphedema precautions and prevention strategies with Max caregiver assist to reduce infection risk limit progression of lymphedema.    Baseline dependent    Time 6    Period Days    Status New    Target Date --   6th OT Rx visit     OT LONG TERM GOAL #3   Title Pt will be able to apply multi-layer, short stretch compression wraps to one leg at a time using correct gradient techniques with max assistance in an effort  to return the affected  limb(s)  to premorbid size and shape, to limit pain and infection risk, and to improve functional mobility and ambulation  for ADLs.    Baseline dependent    Time 6    Period Days    Status New    Target Date --    6th OT Rx visit     OT LONG TERM GOAL #4   Title Pt will achieve at least a 10% limb volume reduction bilaterally to limit lymphedema progression, to improve tissue health and limit infection risk, and to increase tissue flexibility essential for optimal AROM and safe functional ambulation and mobility.    Baseline dependent    Time 12    Period Weeks    Status New    Target Date 09/11/21      OT LONG TERM GOAL #5   Title With Max caregiver assistance Pt will achieve and sustain a least 85% compliance with all LE self-care home program components throughout Intensive Phase CDT, including frequent elevation when seated, daily skin inspection and care, lymphatic pumping ther ex, 23/7 compression wraps and simple self-MLD, to sustain clinical gains made in CDT and to limit lymphedema progression and further functional decline.    Baseline dependent    Time 12    Period Weeks    Status New    Target Date 09/11/21      Long Term Additional Goals   Additional Long Term Goals Yes      OT LONG TERM GOAL #6   Title Using assistive devices (modified independence)  and Max CG assistance Pt will be able to don and doff appropriate daytime compression garments  to limit lymphatic re-accumulation and LE progression before transitioning to self-management phase of CDT.    Baseline dependent    Time 12    Period Weeks    Status New    Target Date 09/11/21                   Plan - 06/13/21 0850     Clinical Impression Statement assistance prognosis for improvement is poor.   Carly [name] is a 70 y o female presenting with mild, stage II, BLE lymphedema (LE) 2/2 progressive weaknes due to MS and dependent positioning. Pt reports insidious onset "years ago" with worsening since her CDA in February 2022.  She presents BLE swelling distributed below the knees to toes with dense, 2+ pitting edema in her feet and distal legs.  Skin is tight below the knees, mildly dry and cool without  hypersensitivity to touch.  Stemmer sign is strongly positive, L>R, with skin creases at base of toes. Chronic leg swelling and associated pain limits Carly [name] ability to lift her legs for functional ambulation, transfers, repositioning and weight shifting in her wheelchair for pressure relief. It contributes to impaired balance and falls and infection risks. It contributes to functional performance deficits in all occupational domains, including basic and instrumental ADLs, productive and  work activities, leisure pursuits and social participation. Mrs., Carly [name] will benefit from skilled Occupational Therapy for Intensive and Management Phase Complete Decongestive Therapy (CDT) to include manual lymphatic drainage (M LD), skin care, therapeutic exercise and compression therapy (short stretch bandaging and custom garments), to reduce swelling, reduce infection risk and limit progression of LE. Emphasis throughout CDT will focus on Pt and caregiver education for LE self-management over time. Without skilled OT for CDT LE will progress and Pt will experience further functional decline. Diligent, daily caregiver assistance is imperative for all aspects of home program during Intensive CDT and for ongoing LE self-care during the self-management phase. In this case, without caregiver assistance the Pt's prognosis for improvement is poor. Additional considerations: 1.) Pt ay benefit from use of advanced sequential pneumatic compression device, or an advanced lymphedema "pump". The Flexitouch is preferred for it's proximal to distal approach which follows proximal to distal fluid return. Pt may be too anxious to explore this device at present, but I will educate her about it at a minimum. 2.) Amlodipine side effects include swelling of the hands, feet, ankles, or lower legs. Pt may benefit from an alternative medication to reduce symptoms.    OT Occupational Profile and History Comprehensive Assessment- Review of  records and extensive additional review of physical, cognitive, psychosocial history related to current functional performance    Occupational performance deficits (Please refer to evaluation for details): ADL's;IADL's;Work;Leisure;Social Participation;Other   body image   Body Structure / Function / Physical Skills ADL;Edema;Skin integrity;Pain;Decreased knowledge of precautions;Decreased knowledge of use of DME;IADL    Rehab Potential Fair    Clinical Decision Making Multiple treatment options, significant modification of task necessary    Comorbidities Affecting Occupational Performance: Presence of comorbidities impacting occupational performance    Comorbidities impacting occupational performance description: See SUBJECTIVE for relevant Dx    Modification or Assistance to Complete Evaluation  Max significant modification of tasks or assist is necessary to complete    OT Frequency 2x / week    OT Duration 12 weeks   and PRN for follow along support and caregiver support   OT Treatment/Interventions Self-care/ADL training;Therapeutic exercise;Coping strategies training;Therapeutic activities;Manual lymph drainage;Energy conservation;Manual Therapy;Other (comment);DME and/or AE instruction;Compression bandaging;Patient/family education   skin care to limit infection risk and increase skin flexibility   Plan Comple Decongestive Therapy: Manual Lymphatic Drainage (MLD, skin care, therapeutic exercise, compression therapy using short stretch    wraps and compression garments/ devices to retain clinical gains    Recommended Other Services Consider trial with advanced sequential pneumatic compression device, or "pump". Flexitouch device recommended over all as this stimulates lymphatics proximally to distal duplicating anatomic function. Need MD clearance due to Hx of CVA and MS. Pt may not tolerate 2/2 anxiety, but will explore her interest. Consider alternative to Amlodipine due to limb swelling side  effect.     Consulted and Agree with Plan of Care Family member/caregiver;Patient    Family Member Consulted Spouse, Bill             Patient will benefit from skilled therapeutic intervention in order to improve the following deficits and impairments:   Body Structure / Function / Physical Skills: ADL, Edema, Skin integrity, Pain, Decreased knowledge of precautions, Decreased knowledge of use of DME, IADL       Visit Diagnosis: Lymphedema, not elsewhere classified - Plan: Ot plan of care cert/re-cert    Problem List Patient Active Problem List   Diagnosis Date Noted   Rash 01/27/2021  History of CVA (cerebrovascular accident) 01/05/2021   PBA (pseudobulbar affect)    MS (multiple sclerosis) ([place])    Anxiety and depression    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   Generalized anxiety disorder 12/11/2020   Acute focal neurological deficit 12/08/2020   Edema 09/28/2020   Bloating 08/25/2020   Environmental allergies 07/17/2019   Hyperglycemia 07/17/2019   Cough 06/06/2019   Memory change 05/29/2019   Abnormal liver function tests 01/09/2019   Hemorrhoid 09/27/2018   Nausea 08/11/2018   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) 11/18/2014   Greater tuberosity of humerus fracture 11/18/2014   Multiple sclerosis ([place]) 11/18/2014   Unsteady gait 11/18/2014   Dizziness 11/18/2014   Inconclusive mammogram 03/15/2013   S/P lumbar spinal fusion 01/04/2013   Surgery, other elective 12/03/2012   DDD (degenerative disc disease), lumbosacral 10/15/2012   Back ache 09/06/2012    Hypertension 09/06/2012   Back pain 09/06/2012   Hypercholesterolemia 01/28/2012   Climacteric 01/28/2012   Routine general medical examination at a health care facility 01/28/2012   Avitaminosis D 01/28/2012   Note: Portions of this document were prepared using Dragon voice recognition software and although reviewed may contain unintentional dictation errors in syntax, grammar, or spelling.   Andrey Spearman, MS, OTR/L, [facility] 06/13/21 10:42 AM   [place] MAIN [facility] SERVICES [address] [place], [place], [address] Phone: [phone]   Fax:  [phone]  Name: KEILIN DOUGHTEN MRN: [id number] Date of Birth: 06-11-1951

## 2021-06-16 ENCOUNTER — Encounter: Payer: Self-pay | Admitting: Internal Medicine

## 2021-06-16 NOTE — Assessment & Plan Note (Signed)
PT - for treatment lymphedema.  Discussed lymphedema pump.  Swelling has been better.

## 2021-06-16 NOTE — Assessment & Plan Note (Signed)
Increased stress related to recent stroke.  Continue risk factor modification.  Follow.

## 2021-06-16 NOTE — Assessment & Plan Note (Signed)
Followed by psychiatry.  On seroquel and remeron.  Follow.

## 2021-06-16 NOTE — Assessment & Plan Note (Signed)
Blood pressure as outlined.  Continue losartan and amlodipine.  Follow pressures.  Follow metabolic panel.   

## 2021-06-16 NOTE — Assessment & Plan Note (Signed)
She appears to be tolerating the Crestor daily.  Follow-up fast lipid profile and liver function testing.

## 2021-06-16 NOTE — Assessment & Plan Note (Signed)
Low-carb diet and exercise.  Follow metabolic panel and A1c. 

## 2021-06-16 NOTE — Assessment & Plan Note (Signed)
Followed by neurology.   

## 2021-06-18 ENCOUNTER — Telehealth: Payer: Self-pay | Admitting: Internal Medicine

## 2021-06-18 ENCOUNTER — Encounter
Payer: Medicare Other | Attending: Physical Medicine and Rehabilitation | Admitting: Physical Medicine and Rehabilitation

## 2021-06-18 ENCOUNTER — Encounter: Payer: Self-pay | Admitting: Physical Medicine and Rehabilitation

## 2021-06-18 ENCOUNTER — Other Ambulatory Visit: Payer: Self-pay

## 2021-06-18 VITALS — BP 134/84 | HR 90 | Ht 63.0 in | Wt 187.0 lb

## 2021-06-18 DIAGNOSIS — I890 Lymphedema, not elsewhere classified: Secondary | ICD-10-CM | POA: Diagnosis not present

## 2021-06-18 DIAGNOSIS — I10 Essential (primary) hypertension: Secondary | ICD-10-CM | POA: Insufficient documentation

## 2021-06-18 DIAGNOSIS — R4189 Other symptoms and signs involving cognitive functions and awareness: Secondary | ICD-10-CM | POA: Diagnosis not present

## 2021-06-18 DIAGNOSIS — G4701 Insomnia due to medical condition: Secondary | ICD-10-CM | POA: Diagnosis not present

## 2021-06-18 DIAGNOSIS — F411 Generalized anxiety disorder: Secondary | ICD-10-CM | POA: Diagnosis not present

## 2021-06-18 DIAGNOSIS — R635 Abnormal weight gain: Secondary | ICD-10-CM | POA: Insufficient documentation

## 2021-06-18 DIAGNOSIS — R4689 Other symptoms and signs involving appearance and behavior: Secondary | ICD-10-CM | POA: Insufficient documentation

## 2021-06-18 DIAGNOSIS — R630 Anorexia: Secondary | ICD-10-CM | POA: Insufficient documentation

## 2021-06-18 DIAGNOSIS — R0989 Other specified symptoms and signs involving the circulatory and respiratory systems: Secondary | ICD-10-CM | POA: Insufficient documentation

## 2021-06-18 MED ORDER — TOPIRAMATE 25 MG PO TABS: 25.0000 mg | ORAL_TABLET | Freq: Every day | ORAL | 3 refills | Status: DC

## 2021-06-18 NOTE — Telephone Encounter (Signed)
She would need to take the abx as directed to get full effectiveness and take with food.  If persistent symptoms and not able to take abx, would recommend ent evlauation.

## 2021-06-18 NOTE — Telephone Encounter (Signed)
Pt is going to take BID with food and see if she is able to tolerate. If not, she will let me know or call ENT for an appointment. She was only taking once a day on an empty stomach.

## 2021-06-18 NOTE — Progress Notes (Signed)
Subjective:    Patient ID: Carly [name], female    DOB: 04-Apr-1951, 70 y.o.   MRN: [id number]  HPI  An audio/video tele-health visit is felt to be the most appropriate encounter for this patient at this time. See MyChart message from today for the patient's consent to a tele-health encounter with [facility]. This is a follow up tele-visit via phone. The patient is at home. MD is at office.     Carly [name] is a 70 year old woman who was admitted to CIR with white matter periventricular CVA, presents for follow-up regarding anxiety   1) Anxiety:  -her psychiatrist transitioned her from Seroquel back to benzodiazepine due to side effect profile of Seroquel. -since she stopped the Seroquel, her anxiety has increased, and she doesn't know what to do. -she and husband Carly [name] were scared of side effects of Seroquel that were discussed  -she has been sleeping but her legs feel very stiff at night -she continues to take Cymbalta '60mg'$  -she has been weaning off the [name]- currently weaned to 7.5 mg which she has been on today.  -she would like to wean completely off this medicine as I had told her it stimulates appetite and can contribute to her weight gain -she discussed with her husband, who is also present on this call today, and she would prefer to restart Seroquel and stop Lorazepam.  -she has not had any counseling and would like to restart this. She has an appointment with [name] in August and would like to start counseling earlier than this if possible.  -she loved speaking with [name] inpatient and would like to follow with him -she had symptoms of dizziness yesterday at the hairdresser and was asked to contact me regarding if this could be from the seroquel. She had had no other similar episodes since starting it.  -she has been sleeping well with the Seroquel at night.  -she would like a list of foods that can help anxiety.   -she has been using essential oil.  -the prednisone has caused weight gain.   2) MS: -has been having a lot of stiffness recently -EMS called twice due to difficulty getting her up off the floor -is receiving home therapy   3) Lymphedema  -has significant bilateral lower extremity  -it has been increasing and her weight goes up. -lasix '20mg'$  did not help enough.  -she starts therapy tomorrow  4) HTN: SBP has been well controlled, but still with high sytolics of 0000000 at times  5) Weaning the mirtazepine has resulted in insomnia but she would prefer to still wean.    Pain Inventory Average Pain 0 Pain Right Now 0 My pain is aching thinks due to antibiotic  In the last 24 hours, has pain interfered with the following? General activity  n/a Relation with others  n/a Enjoyment of life  n/a What TIME of day is your pain at its worst? varies Sleep (in general) Good  Pain is worse with: unsure Pain improves with:  n/a Relief from Meds:  n/a  Family History  Problem Relation Age of Onset   Arthritis Mother    Hypertension Mother    Macular degeneration Mother    Hypertension Father    Hyperlipidemia Father    Heart disease Maternal Grandfather    Diabetes Maternal Grandfather    Kidney disease Paternal Grandmother    Social History   Socioeconomic History   Marital status: Married  Spouse name: Not on file   Number of children: Not on file   Years of education: Not on file   Highest education level: Not on file  Occupational History   Not on file  Tobacco Use   Smoking status: Never   Smokeless tobacco: Never  Vaping Use   Vaping Use: Never used  Substance and Sexual Activity   Alcohol use: No    Alcohol/week: 0.0 standard drinks   Drug use: No   Sexual activity: Not Currently  Other Topics Concern   Not on file  Social History Narrative   Married    Social Determinants of Health   Financial Resource Strain: Low Risk    Difficulty of Paying Living  Expenses: Not hard at all  Food Insecurity: No Food Insecurity   Worried About Charity fundraiser in the Last Year: Never true   Arboriculturist in the Last Year: Never true  Transportation Needs: No Transportation Needs   Lack of Transportation (Medical): No   Lack of Transportation (Non-Medical): No  Physical Activity: Not on file  Stress: No Stress Concern Present   Feeling of Stress : Not at all  Social Connections: Unknown   Frequency of Communication with Friends and Family: Not on file   Frequency of Social Gatherings with Friends and Family: Not on file   Attends Religious Services: Not on file   Active Member of Clubs or Organizations: Not on file   Attends Archivist Meetings: Not on file   Marital Status: Married   Past Surgical History:  Procedure Laterality Date   BACK SURGERY     CHOLECYSTECTOMY     COLONOSCOPY WITH PROPOFOL N/A 05/18/2017   Procedure: COLONOSCOPY WITH PROPOFOL;  Surgeon: [name], MD;  Location: [facility] ENDOSCOPY;  Service: Endoscopy;  Laterality: N/A;   FOOT SURGERY  2015   GALLBLADDER SURGERY  2008   HARDWARE REMOVAL Left 02/14/2016   Procedure: LEFT FOOT REMOVAL DEEP IMPLANT;  Surgeon: [name], MD;  Location: [facility];  Service: Orthopedics;  Laterality: Left;   HERNIA REPAIR     Inguinal Hernia Repair   SPINE SURGERY  2014   Past Surgical History:  Procedure Laterality Date   BACK SURGERY     CHOLECYSTECTOMY     COLONOSCOPY WITH PROPOFOL N/A 05/18/2017   Procedure: COLONOSCOPY WITH PROPOFOL;  Surgeon: [name], MD;  Location: [facility] ENDOSCOPY;  Service: Endoscopy;  Laterality: N/A;   FOOT SURGERY  2015   GALLBLADDER SURGERY  2008   HARDWARE REMOVAL Left 02/14/2016   Procedure: LEFT FOOT REMOVAL DEEP IMPLANT;  Surgeon: [name], MD;  Location: [place];  Service: Orthopedics;  Laterality: Left;   HERNIA REPAIR     Inguinal Hernia Repair   SPINE SURGERY  2014   Past Medical  History:  Diagnosis Date   Allergy    Anxiety    Aspiration pneumonia (HCC)    Depression    Frequent headaches    H/O   GERD (gastroesophageal reflux disease)    History of chicken pox    History of colon polyps    Hx of migraines    Multiple sclerosis (HCC)    PONV (postoperative nausea and vomiting)    BP 134/84 Comment: last recorded  Pulse 90 Comment: last recorded  Ht '5\' 3"'$  (1.6 m) Comment: last recorded  Wt 187 lb (84.8 kg) Comment: last recorded  BMI 33.13 kg/m   Opioid Risk Score:  Fall Risk Score:  `1  Depression screen PHQ 2/9  Depression screen [facility] 2/9 05/31/2021 03/28/2021 11/02/2020 07/12/2019 03/14/2019 03/10/2017  Decreased Interest 3 0 0 0 0 1  Down, Depressed, Hopeless 3 0 0 0 0 1  PHQ - 2 Score 6 0 0 0 0 2  Altered sleeping - 0 - 0 0 0  Tired, decreased energy - 0 - 0 0 0  Change in appetite - 0 - 0 0 0  Feeling bad or failure about yourself  - 0 - 0 0 0  Trouble concentrating - 0 - 0 0 0  Moving slowly or fidgety/restless - 0 - 0 0 0  Suicidal thoughts - 0 - 0 0 0  PHQ-9 Score - 0 - 0 0 2  Difficult doing work/chores - Not difficult at all - Not difficult at all Not difficult at all Very difficult  Some recent data might be hidden    Review of Systems  Constitutional: Negative.   HENT: Negative.    Eyes: Negative.   Respiratory:  Positive for cough.        Respiratory infection  taking antibiotic and prednisone taper dose  Cardiovascular: Negative.   Gastrointestinal: Negative.   Endocrine: Negative.   Genitourinary: Negative.   Musculoskeletal:  Positive for back pain.  Skin: Negative.   Allergic/Immunologic: Negative.   Neurological: Negative.   Hematological:  Bruises/bleeds easily.       Plavix  Psychiatric/Behavioral:  Positive for dysphoric mood.       Objective:   Physical Exam Not performed as patient was seen via phone.      Assessment & Plan:  1) Anxiety: -discussed with patient the side effects of both Seroquel and  Lorazepam, as well as potential interactions with other medications. -advised her to discuss with Bill the risks and benefits of both medications.  -discussed that we can follow-up tomorrow after their conversation to see what they decide -continue to encourage non-pharmacologic management approaches as well, such as meditation, applying lavender oil, and exercise- all of which she has been trying and which have been helping.  -discussed alternative anxiety medications.  -continue seroquel 12.'5mg'$  daily and '25mg'$  at night. Can decreased to '25mg'$  at night and assess positive/negative effects during the day. Can restart 1/2 tab during the day if anxiety is uncontrolled.  -discontinue lorazepam.  -establish care with [name] for neuropsych counseling on 8/2. They really valued the appointment with him.  -prescribed topamax '25mg'$  HS  2) MS -continue home therapy to minimize stiffness/maximize mobility -f/u with MS specialist.   3) HTN: BP has been 130s-150s/80s, recommended citrus foods and nuts, as much mobility as she can tolerate, log Bps daily and bring log to f/u appointment.  -continue current regimen and checking and checking BP.  -advised citrus foods and nuts to help lower BP. Discussed that once BP is consistently 120/80 we can wean her Losartan to '25mg'$ .   4) Bilateral lower extremity edema: - referred for lymphedema therapy- starting tomorrow -discussed response to Lasix. Increase Lasix to '40mg'$ .   5) Insomnia: prescribed topamax to help her sleep better at night, to help curb appetite  20 minutes spent in discussion of her anxiety, bilateral lower extremity edema, hypertension, insomnia, and discussing medication adjustments and providing lifestyle counseling

## 2021-06-18 NOTE — Telephone Encounter (Signed)
Patient called in stating that she was prescribed doxycycline recently for her symptoms and she stopped taking the medicine and her symptoms returned.She also stated it causes her stomach to be upset and she is wondering if she can take it once a day instead of twice a day.Please advise.

## 2021-06-18 NOTE — Telephone Encounter (Signed)
Patient stated she needs a different abx. She is having anxiety and having abdominal px since starting her correct dosage. Patient has been taking abx for 1.5 weeks, correct dose for 3 days. She would [facility]

## 2021-06-19 ENCOUNTER — Other Ambulatory Visit: Payer: Self-pay | Admitting: Internal Medicine

## 2021-06-19 MED ORDER — AZITHROMYCIN 250 MG PO TABS: ORAL_TABLET | ORAL | 0 refills | Status: AC

## 2021-06-19 NOTE — Telephone Encounter (Signed)
Called and spoke to [place].  She is doing better.  Is still having increased drainage and hoarseness.  No headache.  No  sinus pressure.  No cough or chest congestion.  Not tolerating doxycycline.  Has taken zpak and tolerated.  Allergic to pcn, sulfa and cephalosporins.  No history of prolonged QT.  Continue flonase.  Stop afrin.  Saline nasal spray as directed.  Call with update.  If persistent symptoms, will need ENT evaluation.  Pt in agreement.

## 2021-06-19 NOTE — Telephone Encounter (Signed)
Carly [name], please call [name] and inform her that given her allergies to abx, if she is able to tolerate the doxycycline, I would like to continue this medication.  She was previously on prednisone and doxycycline.  If she is not having any problems with the medication, I recommend taking bid with food until complete.  If problems, then let me know.  I would like for her to see ENT if persistent symptoms.

## 2021-06-19 NOTE — Progress Notes (Signed)
Rx sent in for zpak as directed.

## 2021-06-19 NOTE — Telephone Encounter (Signed)
Patient stated she is having very bad stomach cramps taking the two pills vs what she was taking the one pill. She is taking medication with food. She stated her anxiety is worse and her stomach has started hurting when taking the medication as prescribed. She would like a different abx.

## 2021-06-19 NOTE — Telephone Encounter (Signed)
Patient called office about message below. She really needs to know about medication.

## 2021-06-19 NOTE — Telephone Encounter (Signed)
Patients husband came in to see if Dr.Scott has prescribed his wife another antibiotic.He stated that she has tremendous anxiety due to her stroke and they would like a medicine sent in today for her.Please advise.

## 2021-06-20 ENCOUNTER — Other Ambulatory Visit: Payer: Self-pay

## 2021-06-20 ENCOUNTER — Ambulatory Visit: Payer: Medicare Other | Attending: Internal Medicine | Admitting: Occupational Therapy

## 2021-06-20 DIAGNOSIS — I890 Lymphedema, not elsewhere classified: Secondary | ICD-10-CM | POA: Diagnosis not present

## 2021-06-20 NOTE — Therapy (Signed)
[place] MAIN [name] SERVICES [address] [place], [place], [address] Phone: [phone]   Fax:  [phone]  Occupational Therapy Treatment  Patient Details  Name: Carly [name] MRN: [id number] Date of Birth: 12-12-1950 Referring Provider (OT): Einar Pheasant, MD   Encounter Date: 06/20/2021   OT End of Session - 06/20/21 1116     Visit Number 2    Number of Visits 36    Date for OT Re-Evaluation 09/11/21    Authorization Type Initial eval 06/12/21    OT Start Time 1100    OT Stop Time 1215    OT Time Calculation (min) 75 min    Activity Tolerance Other (comment);Patient tolerated treatment well;No increased pain   limited by anxiety and perseveration   Behavior During Therapy [facility] for tasks assessed/performed;Anxious             Past Medical History:  Diagnosis Date   Allergy    Anxiety    Aspiration pneumonia (HCC)    Depression    Frequent headaches    H/O   GERD (gastroesophageal reflux disease)    History of chicken pox    History of colon polyps    Hx of migraines    Multiple sclerosis (HCC)    PONV (postoperative nausea and vomiting)     Past Surgical History:  Procedure Laterality Date   BACK SURGERY     CHOLECYSTECTOMY     COLONOSCOPY WITH PROPOFOL N/A 05/18/2017   Procedure: COLONOSCOPY WITH PROPOFOL;  Surgeon: [name], MD;  Location: [facility] ENDOSCOPY;  Service: Endoscopy;  Laterality: N/A;   FOOT SURGERY  2015   GALLBLADDER SURGERY  2008   HARDWARE REMOVAL Left 02/14/2016   Procedure: LEFT FOOT REMOVAL DEEP IMPLANT;  Surgeon: [name], MD;  Location: [place];  Service: Orthopedics;  Laterality: Left;   HERNIA REPAIR     Inguinal Hernia Repair   SPINE SURGERY  2014    There were no vitals filed for this visit.   Subjective Assessment - 06/20/21 1352     Subjective  Carly [name] presents to OT for Rx visit  2/36 to addressBLE lymphedema (LE). Pt is accompanied by her  husband, Carly [name]. Pt reports B leg pain 11f4-5/ 10.    Patient is accompanied by: Family member    Pertinent History Relevant to limb swelling: Depression /anxiety, Carly [name], HTN. 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 weakness,    Special Tests Intake FOTO: 29/100 (functional outcome score)    Patient Stated Goals to be able to walk better, to get this swelling down. I hate it.    Currently in Pain? Yes    Pain Score 5     Pain Onset Other (comment)   leg pain and swelling first noticed during long care trip ~ 7 hours                         OT Treatments/Exercises (OP) - 06/20/21 1355       ADLs   ADL Education Given Yes      Manual Therapy   Manual Therapy Compression Bandaging;Edema management    Edema Management BLE comparative limb volumetrics    Compression Bandaging LLE miultilayer gradient compresion wraps using single layer of  circumferentially aplied 0.4 cm  thick Rosidal foam over cotton stockinet . Applied one 8 cm and 1 10 cm short stretch wrap over foam  using gradient techniques and Law of [place]                    OT Education - 06/20/21 1354     Person(s) Educated Patient;Spouse    Methods Explanation;Demonstration;Tactile cues;Verbal cues;Handout    Comprehension Verbalized understanding;Returned demonstration;Need further instruction;Verbal cues required;Tactile cues required                 OT Long Term Goals - 06/13/21 0900       OT LONG TERM GOAL #1   Title Given this patient's risk-adjustment variables, her Intake Functional Status score of 29/100 on the FOTO tool, patient will experience at least an increase in function of 3 points for a score of 32/100.    Baseline 97/100    Time 12    Period Weeks    Status New    Target Date 09/11/21      OT  LONG TERM GOAL #2   Title Pt will be able to verbalize at least 4 lymphedema precautions and prevention strategies with Max caregiver assist to reduce infection risk limit progression of lymphedema.    Baseline dependent    Time 6    Period Days    Status New    Target Date --   6th OT Rx visit     OT LONG TERM GOAL #3   Title Pt will be able to apply multi-layer, short stretch compression wraps to one leg at a time using correct gradient techniques with max assistance in an effort  to return the affected  limb(s)  to premorbid size and shape, to limit pain and infection risk, and to improve functional mobility and ambulation  for ADLs.    Baseline dependent    Time 6    Period Days    Status New    Target Date --   6th OT Rx visit     OT LONG TERM GOAL #4   Title Pt will achieve at least a 10% limb volume reduction bilaterally to limit lymphedema progression, to improve tissue health and limit infection risk, and to increase tissue flexibility essential for optimal AROM and safe functional ambulation and mobility.    Baseline dependent    Time 12    Period Weeks    Status New    Target Date 09/11/21      OT LONG TERM GOAL #5   Title With Max caregiver assistance Pt will achieve and sustain a least 85% compliance with all LE self-care home program components throughout Intensive Phase CDT, including frequent elevation when seated, daily skin inspection and care, lymphatic pumping ther ex, 23/7 compression wraps and simple self-MLD, to sustain clinical gains made in CDT and to limit lymphedema progression and further functional decline.    Baseline dependent    Time 12    Period Weeks    Status New    Target Date 09/11/21      Long Term Additional Goals   Additional Long Term Goals Yes      OT LONG TERM GOAL #6   Title Using assistive devices (modified independence)  and Max CG assistance Pt will be able to don and doff appropriate daytime compression garments  to limit lymphatic  re-accumulation and LE progression before transitioning to self-management phase of CDT.    Baseline dependent    Time 12  Period Weeks    Status New    Target Date 09/11/21                   Plan - 06/20/21 1346     Clinical Impression Statement Initial BLE comparative limb volumetrics reveal limb volume differential (LVD) of 2.46%, L>R( dominant). LVD does depict symmetry below the knees, but does not inform the fact that both legs are equally swollen. As we worked today with Pt in supine and legs elevated to level of the heart, I literally observed swelling in feet and legs reduce in this non-dependent position. Without repositioning frequently throughout the day it will be difficult to lemit leg swelling totally. Gravity wind over compression every time. ;-). Applied multi-layer compression wraos from base of toes to tibial tuberosity without increased pain or anxiety. Pt provided with non-slip socks      as her shoes wil not fiit over wraps and she is safer with stability instead of off balance with one shoe on and one shoes off. Pt and spouse education spanned session and focused on wraps application and precautions. Cont as per POC.    OT Occupational Profile and History Comprehensive Assessment- Review of records and extensive additional review of physical, cognitive, psychosocial history related to current functional performance    Occupational performance deficits (Please refer to evaluation for details): ADL's;IADL's;Work;Leisure;Social Participation;Other   body image   Body Structure / Function / Physical Skills ADL;Edema;Skin integrity;Pain;Decreased knowledge of precautions;Decreased knowledge of use of DME;IADL    Rehab Potential Fair    Clinical Decision Making Multiple treatment options, significant modification of task necessary    Comorbidities Affecting Occupational Performance: Presence of comorbidities impacting occupational performance    Comorbidities impacting  occupational performance description: See SUBJECTIVE for relevant Dx    Modification or Assistance to Complete Evaluation  Max significant modification of tasks or assist is necessary to complete    OT Frequency 2x / week    OT Duration 12 weeks   and PRN for follow along support and caregiver support   OT Treatment/Interventions Self-care/ADL training;Therapeutic exercise;Coping strategies training;Therapeutic activities;Manual lymph drainage;Energy conservation;Manual Therapy;Other (comment);DME and/or AE instruction;Compression bandaging;Patient/family education   skin care to limit infection risk and increase skin flexibility   Plan Comple Decongestive Therapy: Manual Lymphatic Drainage (MLD, skin care, therapeutic exercise, compression therapy using short stretch    wraps and compression garments/ devices to retain clinical gains    Recommended Other Services Consider trial with advanced sequential pneumatic compression device, or "pump". Flexitouch device recommended over all as this stimulates lymphatics proximally to distal duplicating anatomic function. Need MD clearance due to Hx of CVA and Carly [name]. Pt may not tolerate 2/2 anxiety, but will explore her interest. Consider alternative to Amlodipine due to limb swelling side effect.    Consulted and Agree with Plan of Care Family member/caregiver;Patient    Family Member Consulted Spouse, Carly [name]             Patient will benefit from skilled therapeutic intervention in order to improve the following deficits and impairments:   Body Structure / Function / Physical Skills: ADL, Edema, Skin integrity, Pain, Decreased knowledge of precautions, Decreased knowledge of use of DME, IADL       Visit Diagnosis: Lymphedema, not elsewhere classified    Problem List Patient Active Problem List   Diagnosis Date Noted   Rash 01/27/2021   History of CVA (cerebrovascular accident) 01/05/2021   PBA (pseudobulbar affect)    Carly [name] (multiple sclerosis) (  [place])     Anxiety and depression    White matter periventricular infarction ([place]) 12/13/2020   Generalized anxiety disorder 12/11/2020   Acute focal neurological deficit 12/08/2020   Edema 09/28/2020   Bloating 08/25/2020   Environmental allergies 07/17/2019   Hyperglycemia 07/17/2019   Cough 06/06/2019   Memory change 05/29/2019   Abnormal liver function tests 01/09/2019   Hemorrhoid 09/27/2018   Nausea 08/11/2018   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   Depression with anxiety 02/04/2018   Cognitive and behavioral changes 02/04/2018   Constipation 11/17/2017   Hx of adenomatous colonic polyps 06/06/2016   Lower extremity edema 01/13/2016   Bilateral ovarian cysts 10/16/2015   Health care maintenance 10/16/2015   Colon polyp 01/25/2015   Bone/cartilage disorder 01/25/2015   Headache 11/18/2014   Depression, recurrent ([place]) 11/18/2014   Greater tuberosity of humerus fracture 11/18/2014   Multiple sclerosis ([place]) 11/18/2014   Unsteady gait 11/18/2014   Dizziness 11/18/2014   Inconclusive mammogram 03/15/2013   S/P lumbar spinal fusion 01/04/2013   Surgery, other elective 12/03/2012   DDD (degenerative disc disease), lumbosacral 10/15/2012   Back ache 09/06/2012   Hypertension 09/06/2012   Back pain 09/06/2012   Hypercholesterolemia 01/28/2012   Climacteric 01/28/2012   Routine general medical examination at a health care facility 01/28/2012   Avitaminosis D 01/28/2012    Carly Spearman, Carly [name], Carly [name], Carly [name] 06/20/21 1:58 PM    [facility] MAIN [name] SERVICES [address] [place], [place], [address] Phone: [phone]   Fax:  [phone]  Name: Carly [name] MRN: [id number] Date of Birth: [birth year]

## 2021-06-24 ENCOUNTER — Encounter (HOSPITAL_BASED_OUTPATIENT_CLINIC_OR_DEPARTMENT_OTHER): Payer: Medicare Other | Admitting: Physical Medicine and Rehabilitation

## 2021-06-24 ENCOUNTER — Other Ambulatory Visit: Payer: Self-pay

## 2021-06-24 DIAGNOSIS — F411 Generalized anxiety disorder: Secondary | ICD-10-CM | POA: Diagnosis not present

## 2021-06-24 DIAGNOSIS — G4701 Insomnia due to medical condition: Secondary | ICD-10-CM | POA: Diagnosis not present

## 2021-06-24 DIAGNOSIS — R635 Abnormal weight gain: Secondary | ICD-10-CM | POA: Diagnosis not present

## 2021-06-24 NOTE — Progress Notes (Signed)
Subjective:    Patient ID: Carly [name], female    DOB: 03/17/1951, 70 y.o.   MRN: [id number]  HPI  An audio/video tele-health visit is felt to be the most appropriate encounter for this patient at this time. This is a follow up tele-visit via phone. The patient is at home. MD is at office.     Carly [name] is a 70 year old woman who was admitted to CIR with white matter periventricular CVA, presents for follow-up regarding anxiety, insomnia, and weight gain.    1) Anxiety:  -her psychiatrist transitioned her from Seroquel back to benzodiazepine due to side effect profile of Seroquel. -since she stopped the Seroquel, her anxiety has increased, and she doesn't know what to do. -she and husband Carly [name] were scared of side effects of Seroquel that were discussed  -she has been sleeping but her legs feel very stiff at night -she continues to take Cymbalta '60mg'$  -she has been weaning off the [name]- currently weaned to 7.5 mg which she has been on today.  -she would like to wean completely off this medicine as I had told her it stimulates appetite and can contribute to her weight gain -she discussed with her husband, who is also present on this call today, and she would prefer to restart Seroquel and stop Lorazepam.  -she has not had any counseling and would like to restart this. She has an appointment with [name] in August and would like to start counseling earlier than this if possible.  -she loved speaking with [name] inpatient and would like to follow with him -she had symptoms of dizziness yesterday at the hairdresser and was asked to contact me regarding if this could be from the seroquel. She had had no other similar episodes since starting it.  -she has been sleeping well with the Seroquel at night.  -she would like a list of foods that can help anxiety.  -she has been using essential oil.  -the prednisone has caused weight gain.   2) MS: -has been having a  lot of stiffness recently -EMS called twice due to difficulty getting her up off the floor -is receiving home therapy   3) Lymphedema  -has significant bilateral lower extremity  -it has been increasing and her weight goes up. -lasix '20mg'$  did not help enough.  -she starts therapy tomorrow  4) HTN: SBP has been well controlled, but still with high sytolics of 0000000 at times  5) Weaning the mirtazepine has resulted in insomnia but she would prefer to still wean.  -she has weaned off as I told her this medication can contribute to weight gain.  -has been sleeping fine at night  6) She developed pain in right side, and not sure if this is due to topamax or the antibiotics she is on to treat a respiratory infection.    Pain Inventory Average Pain 0 Pain Right Now 0 My pain is aching thinks due to antibiotic  In the last 24 hours, has pain interfered with the following? General activity  n/a Relation with others  n/a Enjoyment of life  n/a What TIME of day is your pain at its worst? varies Sleep (in general) Good  Pain is worse with: unsure Pain improves with:  n/a Relief from Meds:  n/a  Family History  Problem Relation Age of Onset   Arthritis Mother    Hypertension Mother    Macular degeneration Mother    Hypertension Father    Hyperlipidemia  Father    Heart disease Maternal Grandfather    Diabetes Maternal Grandfather    Kidney disease Paternal Grandmother    Social History   Socioeconomic History   Marital status: Married    Spouse name: Not on file   Number of children: Not on file   Years of education: Not on file   Highest education level: Not on file  Occupational History   Not on file  Tobacco Use   Smoking status: Never   Smokeless tobacco: Never  Vaping Use   Vaping Use: Never used  Substance and Sexual Activity   Alcohol use: No    Alcohol/week: 0.0 standard drinks   Drug use: No   Sexual activity: Not Currently  Other Topics Concern   Not on  file  Social History Narrative   Married    Social Determinants of Health   Financial Resource Strain: Low Risk    Difficulty of Paying Living Expenses: Not hard at all  Food Insecurity: No Food Insecurity   Worried About Charity fundraiser in the Last Year: Never true   Arboriculturist in the Last Year: Never true  Transportation Needs: No Transportation Needs   Lack of Transportation (Medical): No   Lack of Transportation (Non-Medical): No  Physical Activity: Not on file  Stress: No Stress Concern Present   Feeling of Stress : Not at all  Social Connections: Unknown   Frequency of Communication with Friends and Family: Not on file   Frequency of Social Gatherings with Friends and Family: Not on file   Attends Religious Services: Not on file   Active Member of Clubs or Organizations: Not on file   Attends Archivist Meetings: Not on file   Marital Status: Married   Past Surgical History:  Procedure Laterality Date   BACK SURGERY     CHOLECYSTECTOMY     COLONOSCOPY WITH PROPOFOL N/A 05/18/2017   Procedure: COLONOSCOPY WITH PROPOFOL;  Surgeon: [name], MD;  Location: [facility] ENDOSCOPY;  Service: Endoscopy;  Laterality: N/A;   FOOT SURGERY  2015   GALLBLADDER SURGERY  2008   HARDWARE REMOVAL Left 02/14/2016   Procedure: LEFT FOOT REMOVAL DEEP IMPLANT;  Surgeon: [name], MD;  Location: [place];  Service: Orthopedics;  Laterality: Left;   HERNIA REPAIR     Inguinal Hernia Repair   SPINE SURGERY  2014   Past Surgical History:  Procedure Laterality Date   BACK SURGERY     CHOLECYSTECTOMY     COLONOSCOPY WITH PROPOFOL N/A 05/18/2017   Procedure: COLONOSCOPY WITH PROPOFOL;  Surgeon: [name], MD;  Location[facility] ENDOSCOPY;  Service: Endoscopy;  Laterality: N/A;   FOOT SURGERY  2015   GALLBLADDER SURGERY  2008   HARDWARE REMOVAL Left 02/14/2016   Procedure: LEFT FOOT REMOVAL DEEP IMPLANT;  Surgeon: [name], MD;  Location: [place];  Service: Orthopedics;  Laterality: Left;   HERNIA REPAIR     Inguinal Hernia Repair   SPINE SURGERY  2014   Past Medical History:  Diagnosis Date   Allergy    Anxiety    Aspiration pneumonia (HCC)    Depression    Frequent headaches    H/O   GERD (gastroesophageal reflux disease)    History of chicken pox    History of colon polyps    Hx of migraines    Multiple sclerosis (HCC)    PONV (postoperative nausea and vomiting)  There were no vitals taken for this visit.  Opioid Risk Score:   Fall Risk Score:  `1  Depression screen PHQ 2/9  Depression screen [facility] 2/9 06/18/2021 05/31/2021 03/28/2021 11/02/2020 07/12/2019 03/14/2019 03/10/2017  Decreased Interest 3 3 0 0 0 0 1  Down, Depressed, Hopeless 3 3 0 0 0 0 1  PHQ - 2 Score 6 6 0 0 0 0 2  Altered sleeping - - 0 - 0 0 0  Tired, decreased energy - - 0 - 0 0 0  Change in appetite - - 0 - 0 0 0  Feeling bad or failure about yourself  - - 0 - 0 0 0  Trouble concentrating - - 0 - 0 0 0  Moving slowly or fidgety/restless - - 0 - 0 0 0  Suicidal thoughts - - 0 - 0 0 0  PHQ-9 Score - - 0 - 0 0 2  Difficult doing work/chores - - Not difficult at all - Not difficult at all Not difficult at all Very difficult  Some recent data might be hidden    Review of Systems  Constitutional: Negative.   HENT: Negative.    Eyes: Negative.   Respiratory:  Positive for cough.        Respiratory infection  taking antibiotic and prednisone taper dose  Cardiovascular: Negative.   Gastrointestinal: Negative.   Endocrine: Negative.   Genitourinary: Negative.   Musculoskeletal:  Positive for back pain.  Skin: Negative.   Allergic/Immunologic: Negative.   Neurological: Negative.   Hematological:  Bruises/bleeds easily.       Plavix  Psychiatric/Behavioral:  Positive for dysphoric mood.       Objective:   Physical Exam Not performed as patient was seen via phone.      Assessment & Plan:  1) Anxiety: -discussed with patient  the side effects of both Seroquel and Lorazepam, as well as potential interactions with other medications. -advised her to discuss with Bill the risks and benefits of both medications.  -discussed that we can follow-up tomorrow after their conversation to see what they decide -continue to encourage non-pharmacologic management approaches as well, such as meditation, applying lavender oil, and exercise- all of which she has been trying and which have been helping.  -discussed alternative anxiety medications.  -continue seroquel 12.'5mg'$  daily and '25mg'$  at night. Can decreased to '25mg'$  at night and assess positive/negative effects during the day. Can restart 1/2 tab during the day if anxiety is uncontrolled.  -discontinue lorazepam.  -establish care with [name] for neuropsych counseling on 8/2. They really valued the appointment with him.  -discontinue topamax in case pain in right side is due to medication- discussed can also be due to antibiotics which she is taking for respiratory infection- discontinuing the topamax will help [place] know if it is the cause of her pain or not.  2) MS -continue home therapy to minimize stiffness/maximize mobility -f/u with MS specialist.   3) HTN: BP has been 130s-150s/80s, recommended citrus foods and nuts, as much mobility as she can tolerate, log Bps daily and bring log to f/u appointment.  -continue current regimen and checking and checking BP.  -advised citrus foods and nuts to help lower BP. Discussed that once BP is consistently 120/80 we can wean her Losartan to '25mg'$ .   4) Bilateral lower extremity edema: - referred for lymphedema therapy- starting tomorrow -discussed response to Lasix. Increase Lasix to '40mg'$ .   5) Insomnia: recommended tart cherry juice with dinner, valerian root  and chamomile teas in evening, applying lavender drops to forehead at night.   6) Obesity BMI 33.13 -weaned off mirtazepine  6 minutes spent in discussion of her right  sided pain, how it may be associated with the topamax or abx, stopping topamax to see if it is a contributor, that she has weaned off mirtazepine, recommended trying tart cherry juice, valerian root and chamomile tea, and lavender drops on forehead at night to help her sleep better

## 2021-06-27 ENCOUNTER — Ambulatory Visit: Payer: Medicare Other | Admitting: Occupational Therapy

## 2021-07-01 ENCOUNTER — Other Ambulatory Visit: Payer: Self-pay

## 2021-07-01 ENCOUNTER — Ambulatory Visit: Payer: Medicare Other | Admitting: Occupational Therapy

## 2021-07-01 DIAGNOSIS — I890 Lymphedema, not elsewhere classified: Secondary | ICD-10-CM

## 2021-07-01 DIAGNOSIS — R252 Cramp and spasm: Secondary | ICD-10-CM | POA: Insufficient documentation

## 2021-07-01 NOTE — Therapy (Signed)
[place] MAIN [facility] SERVICES [address] [place], [place], [address] Phone: [phone]   Fax:  [phone]  Occupational Therapy Treatment  Patient Details  Name: Carly [name] MRN: [id number] Date of Birth: December 16, 1950 Referring Provider (OT): Einar Pheasant, MD   Encounter Date: 07/01/2021   OT End of Session - 07/01/21 1414     Visit Number 3    Number of Visits 36    Date for OT Re-Evaluation 09/11/21    Authorization Type Initial eval 06/12/21    OT Start Time 0203    OT Stop Time 0315    OT Time Calculation (min) 72 min    Activity Tolerance Other (comment);Patient tolerated treatment well;No increased pain   limited by anxiety and perseveration   Behavior During Therapy [facility] for tasks assessed/performed;Anxious             Past Medical History:  Diagnosis Date   Allergy    Anxiety    Aspiration pneumonia (HCC)    Depression    Frequent headaches    H/O   GERD (gastroesophageal reflux disease)    History of chicken pox    History of colon polyps    Hx of migraines    Multiple sclerosis (HCC)    PONV (postoperative nausea and vomiting)     Past Surgical History:  Procedure Laterality Date   BACK SURGERY     CHOLECYSTECTOMY     COLONOSCOPY WITH PROPOFOL N/A 05/18/2017   Procedure: COLONOSCOPY WITH PROPOFOL;  Surgeon: [name], MD;  Location: [facility] ENDOSCOPY;  Service: Endoscopy;  Laterality: N/A;   FOOT SURGERY  2015   GALLBLADDER SURGERY  2008   HARDWARE REMOVAL Left 02/14/2016   Procedure: LEFT FOOT REMOVAL DEEP IMPLANT;  Surgeon: [name], MD;  Location: [place];  Service: Orthopedics;  Laterality: Left;   HERNIA REPAIR     Inguinal Hernia Repair   SPINE SURGERY  2014    There were no vitals filed for this visit.   Subjective Assessment - 07/01/21 1415     Subjective  Carly [name] presents to OT for Rx visit  3/36 to addressBLE lymphedema (LE). Pt is accompanied by  her husband, [name]. Pt reports B leg pain 37f4-5/ 10.    Patient is accompanied by: Family member    Pertinent History Relevant to limb swelling: Depression /anxiety, MS, HTN. 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 weakness,    Special Tests Intake FOTO: 29/100 (functional outcome score)    Patient Stated Goals to be able to walk better, to get this swelling down. I hate it.    Pain Onset Other (comment)   leg pain and swelling first noticed during long care trip ~ 7 hours                         OT Treatments/Exercises (OP) - 07/01/21 1416       ADLs   ADL Education Given Yes (P)       Manual Therapy   Manual Therapy Compression Bandaging;Edema management (P)     Compression Bandaging LLE miultilayer gradient compresion wraps using single layer of  circumferentially aplied 0.4 cm thick Rosidal foam over cotton stockinet . Applied one 8 cm and 1 10 cm short stretch wrap over  foam  using gradient techniques and Law of Laplace (P)                     OT Education - 07/01/21 1518     Education Details Continued Pt/ CG edu for lymphedema self care  and home program throughout session. Topics include multilayer, gradient compression wrapping, simple self-MLD, therapeutic lymphatic pumping exercises, skin/nail care, risk reduction factors and LE precautions, compression garments/recommendations and wear and care schedule and compression garment donning / doffing using assistive devices. All questions answered to the Pt's satisfaction, and Pt demonstrates understanding by report.    Person(s) Educated Patient;Spouse    Methods Explanation;Demonstration;Handout;Verbal cues    Comprehension Verbalized understanding;Returned demonstration;Need further instruction;Verbal cues required                  OT Long Term Goals - 06/13/21 0900       OT LONG TERM GOAL #1   Title Given this patient's risk-adjustment variables, her Intake Functional Status score of 29/100 on the FOTO tool, patient will experience at least an increase in function of 3 points for a score of 32/100.    Baseline 97/100    Time 12    Period Weeks    Status New    Target Date 09/11/21      OT LONG TERM GOAL #2   Title Pt will be able to verbalize at least 4 lymphedema precautions and prevention strategies with Max caregiver assist to reduce infection risk limit progression of lymphedema.    Baseline dependent    Time 6    Period Days    Status New    Target Date --   6th OT Rx visit     OT LONG TERM GOAL #3   Title Pt will be able to apply multi-layer, short stretch compression wraps to one leg at a time using correct gradient techniques with max assistance in an effort  to return the affected  limb(s)  to premorbid size and shape, to limit pain and infection risk, and to improve functional mobility and ambulation  for ADLs.    Baseline dependent    Time 6    Period Days    Status New    Target Date --   6th OT Rx visit     OT LONG TERM GOAL #4   Title Pt will achieve at least a 10% limb volume reduction bilaterally to limit lymphedema progression, to improve tissue health and limit infection risk, and to increase tissue flexibility essential for optimal AROM and safe functional ambulation and mobility.    Baseline dependent    Time 12    Period Weeks    Status New    Target Date 09/11/21      OT LONG TERM GOAL #5   Title With Max caregiver assistance Pt will achieve and sustain a least 85% compliance with all LE self-care home program components throughout Intensive Phase CDT, including frequent elevation when seated, daily skin inspection and care, lymphatic pumping ther ex, 23/7 compression wraps and simple self-MLD, to sustain clinical gains made in CDT and to limit lymphedema progression  and further functional decline.    Baseline dependent    Time 12    Period Weeks    Status New    Target Date 09/11/21      Long Term Additional Goals   Additional Long Term Goals Yes      OT LONG TERM GOAL #6   Title  Using assistive devices (modified independence)  and Max CG assistance Pt will be able to don and doff appropriate daytime compression garments  to limit lymphatic re-accumulation and LE progression before transitioning to self-management phase of CDT.    Baseline dependent    Time 12    Period Weeks    Status New    Target Date 09/11/21                   Plan - 07/01/21 1520     Clinical Impression Statement Pt's spouse and primary CG applied compression wraps for past 2 days at home before returning to OT, and while not perfect  n application, I admire him for trying. Wraps were applied generally correct and did help to reduce limb volume slightly. Pt tolerated wraps well and even expressed comfort  with wraps on LLE. Commenced MLD to LLE utilizing modified  short neck sequence (clavicals only), ipsilateral functional inguinal LN, and proximal to distal dynamic strokes to thigh, knee, leg and foot. Reapplied LLE comprssion wraps and provided skilled CG teaching for gradient techniques simultaneously. Productive session. Cont as per POC.    OT Occupational Profile and History Comprehensive Assessment- Review of records and extensive additional review of physical, cognitive, psychosocial history related to current functional performance    Occupational performance deficits (Please refer to evaluation for details): ADL's;IADL's;Work;Leisure;Social Participation;Other   body image   Body Structure / Function / Physical Skills ADL;Edema;Skin integrity;Pain;Decreased knowledge of precautions;Decreased knowledge of use of DME;IADL    Carly Potential Fair    Clinical Decision Making Multiple treatment options, significant modification of task necessary    Comorbidities  Affecting Occupational Performance: Presence of comorbidities impacting occupational performance    Comorbidities impacting occupational performance description: See SUBJECTIVE for relevant Dx    Modification or Assistance to Complete Evaluation  Max significant modification of tasks or assist is necessary to complete    OT Frequency 2x / week    OT Duration 12 weeks   and PRN for follow along support and caregiver support   OT Treatment/Interventions Self-care/ADL training;Therapeutic exercise;Coping strategies training;Therapeutic activities;Manual lymph drainage;Energy conservation;Manual Therapy;Other (comment);DME and/or AE instruction;Compression bandaging;Patient/family education   skin care to limit infection risk and increase skin flexibility   Plan Comple Decongestive Therapy: Manual Lymphatic Drainage (MLD, skin care, therapeutic exercise, compression therapy using short stretch    wraps and compression garments/ devices to retain clinical gains    Recommended Other Services Consider trial with advanced sequential pneumatic compression device, or "pump". Flexitouch device recommended over all as this stimulates lymphatics proximally to distal duplicating anatomic function. Need MD clearance due to Hx of CVA and MS. Pt may not tolerate 2/2 anxiety, but will explore her interest. Consider alternative to Amlodipine due to limb swelling side effect.    Consulted and Agree with Plan of Care Family member/caregiver;Patient    Family Member Consulted Spouse, Bill             Patient will benefit from skilled therapeutic intervention in order to improve the following deficits and impairments:   Body Structure / Function / Physical Skills: ADL, Edema, Skin integrity, Pain, Decreased knowledge of precautions, Decreased knowledge of use of DME, IADL       Visit Diagnosis: Lymphedema, not elsewhere classified    Problem List Patient Active Problem List   Diagnosis Date Noted   Rash  01/27/2021   History of CVA (cerebrovascular accident) 01/05/2021   PBA (pseudobulbar affect)    MS (multiple  sclerosis) ([place])    Anxiety and depression    White matter periventricular infarction ([place]) 12/13/2020   Generalized anxiety disorder 12/11/2020   Acute focal neurological deficit 12/08/2020   Edema 09/28/2020   Bloating 08/25/2020   Environmental allergies 07/17/2019   Hyperglycemia 07/17/2019   Cough 06/06/2019   Memory change 05/29/2019   Abnormal liver function tests 01/09/2019   Hemorrhoid 09/27/2018   Nausea 08/11/2018   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   Depression with anxiety 02/04/2018   Cognitive and behavioral changes 02/04/2018   Constipation 11/17/2017   Hx of adenomatous colonic polyps 06/06/2016   Lower extremity edema 01/13/2016   Bilateral ovarian cysts 10/16/2015   Health care maintenance 10/16/2015   Colon polyp 01/25/2015   Bone/cartilage disorder 01/25/2015   Headache 11/18/2014   Depression, recurrent ([place]) 11/18/2014   Greater tuberosity of humerus fracture 11/18/2014   Multiple sclerosis ([place]) 11/18/2014   Unsteady gait 11/18/2014   Dizziness 11/18/2014   Inconclusive mammogram 03/15/2013   S/P lumbar spinal fusion 01/04/2013   Surgery, other elective 12/03/2012   DDD (degenerative disc disease), lumbosacral 10/15/2012   Back ache 09/06/2012   Hypertension 09/06/2012   Back pain 09/06/2012   Hypercholesterolemia 01/28/2012   Climacteric 01/28/2012   Routine general medical examination at a health care facility 01/28/2012   Avitaminosis D 01/28/2012    Andrey Spearman, MS, OTR/L, [facility] 07/01/21 3:24 PM   [place] [facility] MAIN [facility] SERVICES [address] [place], [place], [address] Phone: [phone]   Fax:  [phone]  Name: SHAELIN FREGEAU MRN: [id number] Date of Birth: Aug 13, 1951

## 2021-07-01 NOTE — Patient Instructions (Signed)

## 2021-07-02 ENCOUNTER — Encounter: Payer: Self-pay | Admitting: Physical Medicine and Rehabilitation

## 2021-07-02 ENCOUNTER — Encounter (HOSPITAL_BASED_OUTPATIENT_CLINIC_OR_DEPARTMENT_OTHER): Payer: Medicare Other | Admitting: Physical Medicine and Rehabilitation

## 2021-07-02 DIAGNOSIS — I890 Lymphedema, not elsewhere classified: Secondary | ICD-10-CM

## 2021-07-02 DIAGNOSIS — F411 Generalized anxiety disorder: Secondary | ICD-10-CM | POA: Diagnosis not present

## 2021-07-02 DIAGNOSIS — R630 Anorexia: Secondary | ICD-10-CM

## 2021-07-02 DIAGNOSIS — G4701 Insomnia due to medical condition: Secondary | ICD-10-CM

## 2021-07-02 DIAGNOSIS — R0989 Other specified symptoms and signs involving the circulatory and respiratory systems: Secondary | ICD-10-CM

## 2021-07-02 NOTE — Progress Notes (Signed)
Subjective:    Patient ID: Carly [name], female    DOB: [birth year], 70 y.o.   MRN: [id number]  HPI  An audio/video tele-health visit is felt to be the most appropriate encounter for this patient at this time. This is a follow up tele-visit via phone. The patient is at home. MD is at office.      Carly [name] is a 70 year old woman who was admitted to CIR with white matter periventricular CVA, presents for follow-up regarding anxiety, insomnia, and weight gain.    1) Anxiety:  -her psychiatrist transitioned her from Seroquel back to benzodiazepine due to side effect profile of Seroquel. -since she stopped the Seroquel, her anxiety has increased, and she doesn't know what to do. -she and husband [name] were scared of side effects of Seroquel that were discussed  -she has been sleeping but her legs feel very stiff at night -she continues to take Cymbalta 60mg  -she has been weaning off the [name]- currently weaned to 7.5 mg which she has been on today.  -she would like to wean completely off this medicine as I had told her it stimulates appetite and can contribute to her weight gain -she discussed with her husband, who is also present on this call today, and she would prefer to restart Seroquel and stop Lorazepam.  -she has not had any counseling and would like to restart this. She has an appointment with [name] in August and would like to start counseling earlier than this if possible.  -she loved speaking with [name] inpatient and would like to follow with him -she had symptoms of dizziness yesterday at the hairdresser and was asked to contact me regarding if this could be from the seroquel. She had had no other similar episodes since starting it.  -she has been sleeping well with the Seroquel at night.  -she would like a list of foods that can help anxiety.  -she has been using essential oil.  -the prednisone has caused weight gain.   2) MS: -has been having a  lot of stiffness recently -EMS called twice due to difficulty getting her up off the floor -is receiving home therapy   3) Lymphedema  -has significant bilateral lower extremity  -has greatly improved with lymphedema therapy -lasix 20mg  did not help enough.   4) HTN: SBP has been well controlled, but still with high sytolics of 315V at times  5) Decreased appetite:  -weaning off of [name] has caused decreased appetite and decreased energy.   6) She developed pain in right side, and not sure if this is due to topamax or the antibiotics she is on to treat a respiratory infection.   7) Insomnia:  -she has been unable to sleep since weaning off of [name] -she states that she had been on 30mg  dose for 30 years and would like to return to this  8) Chest congestion -she has been having symptoms of congestion for greater than 3 months -she does not follow with a pulmonologist -Her father saw 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 She would like to see him -she has been through multiple rounds of antibiotics without benefit.  -also feeling diffuse joint aches.    Pain Inventory Average Pain 0 Pain Right Now 0 My pain is aching thinks due to antibiotic  In the last 24 hours, has pain interfered with the following? General activity  n/a Relation with  others  n/a Enjoyment of life  n/a What TIME of day is your pain at its worst? varies Sleep (in general) Good  Pain is worse with: unsure Pain improves with:  n/a Relief from Meds:  n/a  Family History  Problem Relation Age of Onset   Arthritis Mother    Hypertension Mother    Macular degeneration Mother    Hypertension Father    Hyperlipidemia Father    Heart disease Maternal Grandfather    Diabetes Maternal Grandfather    Kidney disease Paternal Grandmother    Social History   Socioeconomic History   Marital status: Married    Spouse name:  Not on file   Number of children: Not on file   Years of education: Not on file   Highest education level: Not on file  Occupational History   Not on file  Tobacco Use   Smoking status: Never   Smokeless tobacco: Never  Vaping Use   Vaping Use: Never used  Substance and Sexual Activity   Alcohol use: No    Alcohol/week: 0.0 standard drinks   Drug use: No   Sexual activity: Not Currently  Other Topics Concern   Not on file  Social History Narrative   Married    Social Determinants of Health   Financial Resource Strain: Low Risk    Difficulty of Paying Living Expenses: Not hard at all  Food Insecurity: No Food Insecurity   Worried About Charity fundraiser in the Last Year: Never true   Arboriculturist in the Last Year: Never true  Transportation Needs: No Transportation Needs   Lack of Transportation (Medical): No   Lack of Transportation (Non-Medical): No  Physical Activity: Not on file  Stress: No Stress Concern Present   Feeling of Stress : Not at all  Social Connections: Unknown   Frequency of Communication with Friends and Family: Not on file   Frequency of Social Gatherings with Friends and Family: Not on file   Attends Religious Services: Not on file   Active Member of Clubs or Organizations: Not on file   Attends Archivist Meetings: Not on file   Marital Status: Married   Past Surgical History:  Procedure Laterality Date   BACK SURGERY     CHOLECYSTECTOMY     COLONOSCOPY WITH PROPOFOL N/A 05/18/2017   Procedure: COLONOSCOPY WITH PROPOFOL;  Surgeon: [name], MD;  Location: [facility] ENDOSCOPY;  Service: Endoscopy;  Laterality: N/A;   FOOT SURGERY  2015   GALLBLADDER SURGERY  2008   HARDWARE REMOVAL Left 02/14/2016   Procedure: LEFT FOOT REMOVAL DEEP IMPLANT;  Surgeon: [name], MD;  Location: [place];  Service: Orthopedics;  Laterality: Left;   HERNIA REPAIR     Inguinal Hernia Repair   SPINE SURGERY  2014   Past Surgical  History:  Procedure Laterality Date   BACK SURGERY     CHOLECYSTECTOMY     COLONOSCOPY WITH PROPOFOL N/A 05/18/2017   Procedure: COLONOSCOPY WITH PROPOFOL;  Surgeon: [name], MD;  Location: [facility] ENDOSCOPY;  Service: Endoscopy;  Laterality: N/A;   FOOT SURGERY  2015   GALLBLADDER SURGERY  2008   HARDWARE REMOVAL Left 02/14/2016   Procedure: LEFT FOOT REMOVAL DEEP IMPLANT;  Surgeon: [name], MD;  Location: [place];  Service: Orthopedics;  Laterality: Left;   HERNIA REPAIR     Inguinal Hernia Repair   SPINE SURGERY  2014   Past Medical History:  Diagnosis Date   Allergy    Anxiety    Aspiration pneumonia (HCC)    Depression    Frequent headaches    H/O   GERD (gastroesophageal reflux disease)    History of chicken pox    History of colon polyps    Hx of migraines    Multiple sclerosis (HCC)    PONV (postoperative nausea and vomiting)    There were no vitals taken for this visit.  Opioid Risk Score:   Fall Risk Score:  `1  Depression screen PHQ 2/9  Depression screen [facility] 2/9 06/18/2021 05/31/2021 03/28/2021 11/02/2020 07/12/2019 03/14/2019 03/10/2017  Decreased Interest 3 3 0 0 0 0 1  Down, Depressed, Hopeless 3 3 0 0 0 0 1  PHQ - 2 Score 6 6 0 0 0 0 2  Altered sleeping - - 0 - 0 0 0  Tired, decreased energy - - 0 - 0 0 0  Change in appetite - - 0 - 0 0 0  Feeling bad or failure about yourself  - - 0 - 0 0 0  Trouble concentrating - - 0 - 0 0 0  Moving slowly or fidgety/restless - - 0 - 0 0 0  Suicidal thoughts - - 0 - 0 0 0  PHQ-9 Score - - 0 - 0 0 2  Difficult doing work/chores - - Not difficult at all - Not difficult at all Not difficult at all Very difficult  Some recent data might be hidden    Review of Systems  Constitutional: Negative.   HENT: Negative.    Eyes: Negative.   Respiratory:  Positive for cough.        Respiratory infection  taking antibiotic and prednisone taper dose  Cardiovascular: Negative.   Gastrointestinal: Negative.    Endocrine: Negative.   Genitourinary: Negative.   Musculoskeletal:  Positive for back pain.  Skin: Negative.   Allergic/Immunologic: Negative.   Neurological: Negative.   Hematological:  Bruises/bleeds easily.       Plavix  Psychiatric/Behavioral:  Positive for dysphoric mood.       Objective:   Physical Exam Not performed as patient was seen via phone.      Assessment & Plan:  1) Anxiety: -discussed with patient the side effects of both Seroquel and Lorazepam, as well as potential interactions with other medications. -advised her to discuss with Bill the risks and benefits of both medications.  -discussed that we can follow-up tomorrow after their conversation to see what they decide -continue to encourage non-pharmacologic management approaches as well, such as meditation, applying lavender oil, and exercise- all of which she has been trying and which have been helping.  -discussed alternative anxiety medications.  -continue seroquel 12.5mg  daily and 25mg  at night. Can decreased to 25mg  at night and assess positive/negative effects during the day. Can restart 1/2 tab during the day if anxiety is uncontrolled.  -discontinue lorazepam.  -establish care with [name] for neuropsych counseling on 8/2. They really valued the appointment with him.  -discontinue topamax in case pain in right side is due to medication- discussed can also be due to antibiotics which she is taking for respiratory infection- discontinuing the topamax will help [place] know if it is the cause of her pain or not. -restart [name] 7.5mg  tonight, then 15mg  tomorrow night, then 30mg  the following night.   2) MS -continue home therapy to minimize stiffness/maximize mobility -f/u with MS specialist.   3) HTN: BP has been 130s-150s/80s, recommended citrus foods  and nuts, as much mobility as she can tolerate, log Bps daily and bring log to f/u appointment.  -continue current regimen and checking and checking  BP.  -advised citrus foods and nuts to help lower BP. Discussed that once BP is consistently 120/80 we can wean her Losartan to 25mg .   4) Bilateral lower extremity edema: - continue lymphedema therapy which is greatly helping.  -discussed response to Lasix. Increase Lasix to 40mg .   5) Insomnia: -recommended tart cherry juice with dinner, valerian root and chamomile teas in evening, applying lavender drops to forehead at night. -restart mirtazepine 7.5mg  at night, if still having trouble sleeping then increase to 15mg  tomorrow night, and if still having problems, increase to 30mg  the following night.   6) Obesity BMI 33.13 -attempted to wean off [name] but she developed worsening insomnia, anxiety, and decreased appetite that was undesirable to her, so have restarted the medication.   7) Chest congestion -referred for pulmonary consult with [name], who treated her father for pulmonary fibrosis -recommended drinking daily warm water with honey, lime and ginger  14 minutes spent in discussion of her worsening insomnia, anxiety, and decreased appetite since weaning off mirtazepine, discussed restarting mirtazepine at 7.5mg  tonight, 15mg  tomorrow, and 30mg  the next date, discussed pulmonary symptoms and referred to pulmonologist who cared for her father as per her request

## 2021-07-03 ENCOUNTER — Encounter: Payer: Medicare Other | Admitting: Physical Medicine and Rehabilitation

## 2021-07-04 ENCOUNTER — Ambulatory Visit: Payer: Medicare Other | Admitting: Occupational Therapy

## 2021-07-05 ENCOUNTER — Other Ambulatory Visit: Payer: Self-pay

## 2021-07-05 ENCOUNTER — Encounter (HOSPITAL_BASED_OUTPATIENT_CLINIC_OR_DEPARTMENT_OTHER): Payer: Medicare Other | Admitting: Physical Medicine and Rehabilitation

## 2021-07-05 DIAGNOSIS — F411 Generalized anxiety disorder: Secondary | ICD-10-CM

## 2021-07-05 DIAGNOSIS — G4701 Insomnia due to medical condition: Secondary | ICD-10-CM

## 2021-07-05 MED ORDER — MIRTAZAPINE 30 MG PO TBDP: 30.0000 mg | ORAL_TABLET | Freq: Every day | ORAL | 3 refills | Status: DC

## 2021-07-05 NOTE — Progress Notes (Signed)
Subjective:    Patient ID: Carly [name], female    DOB: 1951/06/07, 70 y.o.   MRN: [id number]  HPI  An audio/video tele-health visit is felt to be the most appropriate encounter for this patient at this time. This is a follow up tele-visit via phone. The patient is at home. MD is at office.      Carly [name] is a 70 year old woman who was admitted to CIR with white matter periventricular CVA, presents for follow-up regarding anxiety, insomnia, and weight gain.    1) Anxiety:  -her psychiatrist transitioned her from Seroquel back to benzodiazepine due to side effect profile of Seroquel. -since she stopped the Seroquel, her anxiety has increased, and she doesn't know what to do. -she and husband [name] were scared of side effects of Seroquel that were discussed  -she has been sleeping but her legs feel very stiff at night -she continues to take Cymbalta 60mg  -she has been weaning off the [name]- currently weaned to 7.5 mg which she has been on today.  -she would like to wean completely off this medicine as I had told her it stimulates appetite and can contribute to her weight gain -she discussed with her husband, who is also present on this call today, and she would prefer to restart Seroquel and stop Lorazepam.  -she has not had any counseling and would like to restart this. She has an appointment with [name] in August and would like to start counseling earlier than this if possible.  -she loved speaking with [name] inpatient and would like to follow with him -she had symptoms of dizziness yesterday at the hairdresser and was asked to contact me regarding if this could be from the seroquel. She had had no other similar episodes since starting it.  -she has been sleeping well with the Seroquel at night.  -she would like a list of foods that can help anxiety.  -she has been using essential oil.  -the prednisone has caused weight gain.   2) MS: -has been having a  lot of stiffness recently -EMS called twice due to difficulty getting her up off the floor -is receiving home therapy   3) Lymphedema  -has significant bilateral lower extremity  -has greatly improved with lymphedema therapy -lasix 20mg  did not help enough.   4) HTN: SBP has been well controlled, but still with high sytolics of 295J at times  5) Decreased appetite:  -weaning off of [name] has caused decreased appetite and decreased energy.   6) She developed pain in right side, and not sure if this is due to topamax or the antibiotics she is on to treat a respiratory infection.   7) Insomnia:  -she has been unable to sleep since weaning off of [name] -she states that she had been on 30mg  dose for 30 years and would like to return to this  8) Chest congestion -she has been having symptoms of congestion for greater than 3 months -she does not follow with a pulmonologist -Her father saw 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 She would like to see him -she has been through multiple rounds of antibiotics without benefit.  -also feeling diffuse joint aches.    Pain Inventory Average Pain 0 Pain Right Now 0 My pain is aching thinks due to antibiotic  In the last 24 hours, has pain interfered with the following? General activity  n/a Relation with  others  n/a Enjoyment of life  n/a What TIME of day is your pain at its worst? varies Sleep (in general) Good  Pain is worse with: unsure Pain improves with:  n/a Relief from Meds:  n/a  Family History  Problem Relation Age of Onset   Arthritis Mother    Hypertension Mother    Macular degeneration Mother    Hypertension Father    Hyperlipidemia Father    Heart disease Maternal Grandfather    Diabetes Maternal Grandfather    Kidney disease Paternal Grandmother    Social History   Socioeconomic History   Marital status: Married    Spouse name:  Not on file   Number of children: Not on file   Years of education: Not on file   Highest education level: Not on file  Occupational History   Not on file  Tobacco Use   Smoking status: Never   Smokeless tobacco: Never  Vaping Use   Vaping Use: Never used  Substance and Sexual Activity   Alcohol use: No    Alcohol/week: 0.0 standard drinks   Drug use: No   Sexual activity: Not Currently  Other Topics Concern   Not on file  Social History Narrative   Married    Social Determinants of Health   Financial Resource Strain: Low Risk    Difficulty of Paying Living Expenses: Not hard at all  Food Insecurity: No Food Insecurity   Worried About Charity fundraiser in the Last Year: Never true   Arboriculturist in the Last Year: Never true  Transportation Needs: No Transportation Needs   Lack of Transportation (Medical): No   Lack of Transportation (Non-Medical): No  Physical Activity: Not on file  Stress: No Stress Concern Present   Feeling of Stress : Not at all  Social Connections: Unknown   Frequency of Communication with Friends and Family: Not on file   Frequency of Social Gatherings with Friends and Family: Not on file   Attends Religious Services: Not on file   Active Member of Clubs or Organizations: Not on file   Attends Archivist Meetings: Not on file   Marital Status: Married   Past Surgical History:  Procedure Laterality Date   BACK SURGERY     CHOLECYSTECTOMY     COLONOSCOPY WITH PROPOFOL N/A 05/18/2017   Procedure: COLONOSCOPY WITH PROPOFOL;  Surgeon: [name], MD;  Location: [facility] ENDOSCOPY;  Service: Endoscopy;  Laterality: N/A;   FOOT SURGERY  2015   GALLBLADDER SURGERY  2008   HARDWARE REMOVAL Left 02/14/2016   Procedure: LEFT FOOT REMOVAL DEEP IMPLANT;  Surgeon: [name], MD;  Location: [place];  Service: Orthopedics;  Laterality: Left;   HERNIA REPAIR     Inguinal Hernia Repair   SPINE SURGERY  2014   Past Surgical  History:  Procedure Laterality Date   BACK SURGERY     CHOLECYSTECTOMY     COLONOSCOPY WITH PROPOFOL N/A 05/18/2017   Procedure: COLONOSCOPY WITH PROPOFOL;  Surgeon: [name], MD;  Location: [facility] ENDOSCOPY;  Service: Endoscopy;  Laterality: N/A;   FOOT SURGERY  2015   GALLBLADDER SURGERY  2008   HARDWARE REMOVAL Left 02/14/2016   Procedure: LEFT FOOT REMOVAL DEEP IMPLANT;  Surgeon: [name], MD;  Location: [place];  Service: Orthopedics;  Laterality: Left;   HERNIA REPAIR     Inguinal Hernia Repair   SPINE SURGERY  2014   Past Medical History:  Diagnosis Date   Allergy    Anxiety    Aspiration pneumonia (HCC)    Depression    Frequent headaches    H/O   GERD (gastroesophageal reflux disease)    History of chicken pox    History of colon polyps    Hx of migraines    Multiple sclerosis (HCC)    PONV (postoperative nausea and vomiting)    There were no vitals taken for this visit.  Opioid Risk Score:   Fall Risk Score:  `1  Depression screen PHQ 2/9  Depression screen [facility] 2/9 06/18/2021 05/31/2021 03/28/2021 11/02/2020 07/12/2019 03/14/2019 03/10/2017  Decreased Interest 3 3 0 0 0 0 1  Down, Depressed, Hopeless 3 3 0 0 0 0 1  PHQ - 2 Score 6 6 0 0 0 0 2  Altered sleeping - - 0 - 0 0 0  Tired, decreased energy - - 0 - 0 0 0  Change in appetite - - 0 - 0 0 0  Feeling bad or failure about yourself  - - 0 - 0 0 0  Trouble concentrating - - 0 - 0 0 0  Moving slowly or fidgety/restless - - 0 - 0 0 0  Suicidal thoughts - - 0 - 0 0 0  PHQ-9 Score - - 0 - 0 0 2  Difficult doing work/chores - - Not difficult at all - Not difficult at all Not difficult at all Very difficult  Some recent data might be hidden    Review of Systems  Constitutional: Negative.   HENT: Negative.    Eyes: Negative.   Respiratory:  Positive for cough.        Respiratory infection  taking antibiotic and prednisone taper dose  Cardiovascular: Negative.   Gastrointestinal: Negative.    Endocrine: Negative.   Genitourinary: Negative.   Musculoskeletal:  Positive for back pain.  Skin: Negative.   Allergic/Immunologic: Negative.   Neurological: Negative.   Hematological:  Bruises/bleeds easily.       Plavix  Psychiatric/Behavioral:  Positive for dysphoric mood.       Objective:   Physical Exam Not performed as patient was seen via phone.      Assessment & Plan:  1) Anxiety: -discussed with patient the side effects of both Seroquel and Lorazepam, as well as potential interactions with other medications. -advised her to discuss with Bill the risks and benefits of both medications.  -discussed that we can follow-up tomorrow after their conversation to see what they decide -continue to encourage non-pharmacologic management approaches as well, such as meditation, applying lavender oil, and exercise- all of which she has been trying and which have been helping.  -discussed alternative anxiety medications.  -continue seroquel 12.5mg  daily and 25mg  at night. Can decreased to 25mg  at night and assess positive/negative effects during the day. Can restart 1/2 tab during the day if anxiety is uncontrolled.  -discontinue lorazepam.  -establish care with [name] for neuropsych counseling on 8/2. They really valued the appointment with him.  -discontinue topamax in case pain in right side is due to medication- discussed can also be due to antibiotics which she is taking for respiratory infection- discontinuing the topamax will help [place] know if it is the cause of her pain or not. -restart [name] 7.5mg  tonight, then 15mg  tomorrow night, then 30mg  the following night.   2) MS -continue home therapy to minimize stiffness/maximize mobility -f/u with MS specialist.   3) HTN: BP has been 130s-150s/80s, recommended citrus foods  and nuts, as much mobility as she can tolerate, log Bps daily and bring log to f/u appointment.  -continue current regimen and checking and checking  BP.  -advised citrus foods and nuts to help lower BP. Discussed that once BP is consistently 120/80 we can wean her Losartan to 25mg .   4) Bilateral lower extremity edema: - continue lymphedema therapy which is greatly helping.  -discussed response to Lasix. Increase Lasix to 40mg .   5) Insomnia: -recommended tart cherry juice with dinner, valerian root and chamomile teas in evening, applying lavender drops to forehead at night. -restart mirtazepine 7.5mg  at night, if still having trouble sleeping then increase to 15mg  tomorrow night, and if still having problems, increase to 30mg  the following night.   6) Obesity BMI 33.13 -attempted to wean off [name] but she developed worsening insomnia, anxiety, and decreased appetite that was undesirable to her, so have restarted the medication.   7) Chest congestion -referred for pulmonary consult with [name], who treated her father for pulmonary fibrosis -recommended drinking daily warm water with honey, lime and ginger  14 minutes spent in discussion of her worsening insomnia, anxiety, and decreased appetite since weaning off mirtazepine, discussed restarting mirtazepine at 7.5mg  tonight, 15mg  tomorrow, and 30mg  the next date, discussed pulmonary symptoms and referred to pulmonologist who cared for her father as per her request

## 2021-07-08 ENCOUNTER — Other Ambulatory Visit: Payer: Self-pay | Admitting: Internal Medicine

## 2021-07-08 ENCOUNTER — Other Ambulatory Visit: Payer: Self-pay

## 2021-07-08 ENCOUNTER — Encounter (HOSPITAL_BASED_OUTPATIENT_CLINIC_OR_DEPARTMENT_OTHER): Payer: Medicare Other | Admitting: Physical Medicine and Rehabilitation

## 2021-07-08 DIAGNOSIS — R0989 Other specified symptoms and signs involving the circulatory and respiratory systems: Secondary | ICD-10-CM

## 2021-07-08 DIAGNOSIS — G4701 Insomnia due to medical condition: Secondary | ICD-10-CM | POA: Diagnosis not present

## 2021-07-08 NOTE — Progress Notes (Signed)
Subjective:    Patient ID: Carly [name], female    DOB: 1951/06/16, 70 y.o.   MRN: [id number]  HPI  An audio/video tele-health visit is felt to be the most appropriate encounter for this patient at this time. This is a follow up tele-visit via phone. The patient is at home. MD is at office.     Carly [name] is a 70 year old woman who was admitted to CIR with white matter periventricular CVA, presents for follow-up regarding anxiety, insomnia, and weight gain.    1) Anxiety:  -her psychiatrist transitioned her from Seroquel back to benzodiazepine due to side effect profile of Seroquel. -since she stopped the Seroquel, her anxiety has increased, and she doesn't know what to do. -she and husband Carly [name] were scared of side effects of Seroquel that were discussed  -she has been sleeping but her legs feel very stiff at night -she continues to take Cymbalta 60mg  -she has been weaning off the [name]- currently weaned to 7.5 mg which she has been on today.  -she would like to wean completely off this medicine as I had told her it stimulates appetite and can contribute to her weight gain -she discussed with her husband, who is also present on this call today, and she would prefer to restart Seroquel and stop Lorazepam.  -she has not had any counseling and would like to restart this. She has an appointment with [name] in August and would like to start counseling earlier than this if possible.  -she loved speaking with [name] inpatient and would like to follow with him -she had symptoms of dizziness yesterday at the hairdresser and was asked to contact me regarding if this could be from the seroquel. She had had no other similar episodes since starting it.  -she has been sleeping well with the Seroquel at night.  -she would like a list of foods that can help anxiety.  -she has been using essential oil.  -the prednisone has caused weight gain.   2) MS: -has been having a  lot of stiffness recently -EMS called twice due to difficulty getting her up off the floor -is receiving home therapy   3) Lymphedema  -has significant bilateral lower extremity  -has greatly improved with lymphedema therapy -lasix 20mg  did not help enough.   4) HTN: SBP has been well controlled, but still with high sytolics of 244W at times  5) Decreased appetite:  -weaning off of [name] has caused decreased appetite and decreased energy.   6) She developed pain in right side, and not sure if this is due to topamax or the antibiotics she is on to treat a respiratory infection.   7) Insomnia:  -sleeping much better since she started back on [name]  8) Chest congestion -she has been having symptoms of congestion for greater than 3 months -Her father saw 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 She received a call to schedule an appointment with [name] office and is thankful for the referral.  -she asks what else she can do to help with this -she has been through multiple rounds of antibiotics without benefit.  -also feeling diffuse joint aches.   9) GERD -has been experiencing after eating   Pain Inventory Average Pain 0 Pain Right Now 0 My pain is aching thinks due to antibiotic  In the last 24 hours, has pain interfered with the following? General activity  n/a Relation with others  n/a Enjoyment of life  n/a What TIME of day is your pain at its worst? varies Sleep (in general) Good  Pain is worse with: unsure Pain improves with:  n/a Relief from Meds:  n/a  Family History  Problem Relation Age of Onset   Arthritis Mother    Hypertension Mother    Macular degeneration Mother    Hypertension Father    Hyperlipidemia Father    Heart disease Maternal Grandfather    Diabetes Maternal Grandfather    Kidney disease Paternal Grandmother    Social History   Socioeconomic History    Marital status: Married    Spouse name: Not on file   Number of children: Not on file   Years of education: Not on file   Highest education level: Not on file  Occupational History   Not on file  Tobacco Use   Smoking status: Never   Smokeless tobacco: Never  Vaping Use   Vaping Use: Never used  Substance and Sexual Activity   Alcohol use: No    Alcohol/week: 0.0 standard drinks   Drug use: No   Sexual activity: Not Currently  Other Topics Concern   Not on file  Social History Narrative   Married    Social Determinants of Health   Financial Resource Strain: Low Risk    Difficulty of Paying Living Expenses: Not hard at all  Food Insecurity: No Food Insecurity   Worried About Charity fundraiser in the Last Year: Never true   Arboriculturist in the Last Year: Never true  Transportation Needs: No Transportation Needs   Lack of Transportation (Medical): No   Lack of Transportation (Non-Medical): No  Physical Activity: Not on file  Stress: No Stress Concern Present   Feeling of Stress : Not at all  Social Connections: Unknown   Frequency of Communication with Friends and Family: Not on file   Frequency of Social Gatherings with Friends and Family: Not on file   Attends Religious Services: Not on file   Active Member of Clubs or Organizations: Not on file   Attends Archivist Meetings: Not on file   Marital Status: Married   Past Surgical History:  Procedure Laterality Date   BACK SURGERY     CHOLECYSTECTOMY     COLONOSCOPY WITH PROPOFOL N/A 05/18/2017   Procedure: COLONOSCOPY WITH PROPOFOL;  Surgeon: [name], MD;  Location: [facility] ENDOSCOPY;  Service: Endoscopy;  Laterality: N/A;   FOOT SURGERY  2015   GALLBLADDER SURGERY  2008   HARDWARE REMOVAL Left 02/14/2016   Procedure: LEFT FOOT REMOVAL DEEP IMPLANT;  Surgeon: [name], MD;  Location: [place];  Service: Orthopedics;  Laterality: Left;   HERNIA REPAIR     Inguinal Hernia  Repair   SPINE SURGERY  2014   Past Surgical History:  Procedure Laterality Date   BACK SURGERY     CHOLECYSTECTOMY     COLONOSCOPY WITH PROPOFOL N/A 05/18/2017   Procedure: COLONOSCOPY WITH PROPOFOL;  Surgeon: [name], MD;  Location: [facility] ENDOSCOPY;  Service: Endoscopy;  Laterality: N/A;   FOOT SURGERY  2015   GALLBLADDER SURGERY  2008   HARDWARE REMOVAL Left 02/14/2016   Procedure: LEFT FOOT REMOVAL DEEP IMPLANT;  Surgeon: [name], MD;  Location: [place];  Service: Orthopedics;  Laterality: Left;   HERNIA REPAIR     Inguinal Hernia Repair   SPINE SURGERY  2014  Past Medical History:  Diagnosis Date   Allergy    Anxiety    Aspiration pneumonia (HCC)    Depression    Frequent headaches    H/O   GERD (gastroesophageal reflux disease)    History of chicken pox    History of colon polyps    Hx of migraines    Multiple sclerosis (HCC)    PONV (postoperative nausea and vomiting)    There were no vitals taken for this visit.  Opioid Risk Score:   Fall Risk Score:  `1  Depression screen PHQ 2/9  Depression screen [facility] 2/9 06/18/2021 05/31/2021 03/28/2021 11/02/2020 07/12/2019 03/14/2019 03/10/2017  Decreased Interest 3 3 0 0 0 0 1  Down, Depressed, Hopeless 3 3 0 0 0 0 1  PHQ - 2 Score 6 6 0 0 0 0 2  Altered sleeping - - 0 - 0 0 0  Tired, decreased energy - - 0 - 0 0 0  Change in appetite - - 0 - 0 0 0  Feeling bad or failure about yourself  - - 0 - 0 0 0  Trouble concentrating - - 0 - 0 0 0  Moving slowly or fidgety/restless - - 0 - 0 0 0  Suicidal thoughts - - 0 - 0 0 0  PHQ-9 Score - - 0 - 0 0 2  Difficult doing work/chores - - Not difficult at all - Not difficult at all Not difficult at all Very difficult  Some recent data might be hidden    Review of Systems  Constitutional: Negative.   HENT: Negative.    Eyes: Negative.   Respiratory:  Positive for cough.        Respiratory infection  taking antibiotic and prednisone taper dose   Cardiovascular: Negative.   Gastrointestinal: Negative.   Endocrine: Negative.   Genitourinary: Negative.   Musculoskeletal:  Positive for back pain.  Skin: Negative.   Allergic/Immunologic: Negative.   Neurological: Negative.   Hematological:  Bruises/bleeds easily.       Plavix  Psychiatric/Behavioral:  Positive for dysphoric mood.       Objective:   Physical Exam Not performed as patient was seen via phone.      Assessment & Plan:  1) Anxiety: -discussed with patient the side effects of both Seroquel and Lorazepam, as well as potential interactions with other medications. -advised her to discuss with Carly [name] the risks and benefits of both medications.  -discussed that we can follow-up tomorrow after their conversation to see what they decide -continue to encourage non-pharmacologic management approaches as well, such as meditation, applying lavender oil, and exercise- all of which she has been trying and which have been helping.  -discussed alternative anxiety medications.  -continue seroquel 12.5mg  daily and 25mg  at night. Can decreased to 25mg  at night and assess positive/negative effects during the day. Can restart 1/2 tab during the day if anxiety is uncontrolled.  -discontinue lorazepam.  -establish care with [name] for neuropsych counseling on 8/2. They really valued the appointment with him.  -discontinue topamax in case pain in right side is due to medication- discussed can also be due to antibiotics which she is taking for respiratory infection- discontinuing the topamax will help [place] know if it is the cause of her pain or not. -restart [name] 7.5mg  tonight, then 15mg  tomorrow night, then 30mg  the following night.   2) MS -continue home therapy to minimize stiffness/maximize mobility -f/u with MS specialist.   3) HTN: BP has been  130s-150s/80s, recommended citrus foods and nuts, as much mobility as she can tolerate, log Bps daily and bring log to f/u  appointment.  -continue current regimen and checking and checking BP.  -advised citrus foods and nuts to help lower BP. Discussed that once BP is consistently 120/80 we can wean her Losartan to 25mg .   4) Bilateral lower extremity edema: - continue lymphedema therapy which is greatly helping.  -discussed response to Lasix. Increase Lasix to 40mg .   5) Insomnia: -recommended tart cherry juice with dinner, valerian root and chamomile teas in evening, applying lavender drops to forehead at night. -continue [name] 30mg  HS- discussed that we will not try to wean off again since we saw how much this is benefittng her  6) Obesity BMI 33.13 -attempted to wean off [name] but she developed worsening insomnia, anxiety, and decreased appetite that was undesirable to her, so have restarted the medication.   7) Chest congestion -referred for pulmonary consult with [name], who treated her father for pulmonary fibrosis. She has made an appointment to establish care with him.  -recommended drinking daily warm water with honey, lime and ginger -recommended drinking tea with holy Basil (Tulsi)  8) Sore throat: -recommended salt water gargling  10 minutes spent in discussion of insomnia, response to mirtazepine, conitnuing on this dose from now on since we saw how much it is helping her, discussed walt water gargling for her sore throat, honey/lime/ginger and Hart Robinsons for her chest congestion

## 2021-07-09 ENCOUNTER — Ambulatory Visit: Payer: Medicare Other | Admitting: Occupational Therapy

## 2021-07-09 ENCOUNTER — Telehealth: Payer: Medicare Other | Admitting: Physical Medicine and Rehabilitation

## 2021-07-10 DIAGNOSIS — J321 Chronic frontal sinusitis: Secondary | ICD-10-CM | POA: Diagnosis not present

## 2021-07-10 DIAGNOSIS — G4733 Obstructive sleep apnea (adult) (pediatric): Secondary | ICD-10-CM | POA: Diagnosis not present

## 2021-07-10 DIAGNOSIS — R490 Dysphonia: Secondary | ICD-10-CM | POA: Diagnosis not present

## 2021-07-10 DIAGNOSIS — J310 Chronic rhinitis: Secondary | ICD-10-CM | POA: Diagnosis not present

## 2021-07-11 ENCOUNTER — Telehealth (INDEPENDENT_AMBULATORY_CARE_PROVIDER_SITE_OTHER): Payer: Medicare Other | Admitting: Internal Medicine

## 2021-07-11 ENCOUNTER — Encounter: Payer: Medicare Other | Admitting: Occupational Therapy

## 2021-07-11 ENCOUNTER — Other Ambulatory Visit: Payer: Self-pay | Admitting: Specialist

## 2021-07-11 DIAGNOSIS — R739 Hyperglycemia, unspecified: Secondary | ICD-10-CM | POA: Diagnosis not present

## 2021-07-11 DIAGNOSIS — I10 Essential (primary) hypertension: Secondary | ICD-10-CM | POA: Diagnosis not present

## 2021-07-11 DIAGNOSIS — E7800 Pure hypercholesterolemia, unspecified: Secondary | ICD-10-CM | POA: Diagnosis not present

## 2021-07-11 DIAGNOSIS — F339 Major depressive disorder, recurrent, unspecified: Secondary | ICD-10-CM | POA: Diagnosis not present

## 2021-07-11 DIAGNOSIS — Z8673 Personal history of transient ischemic attack (TIA), and cerebral infarction without residual deficits: Secondary | ICD-10-CM

## 2021-07-11 DIAGNOSIS — J321 Chronic frontal sinusitis: Secondary | ICD-10-CM

## 2021-07-11 DIAGNOSIS — R600 Localized edema: Secondary | ICD-10-CM

## 2021-07-11 DIAGNOSIS — G35 Multiple sclerosis: Secondary | ICD-10-CM

## 2021-07-11 DIAGNOSIS — R490 Dysphonia: Secondary | ICD-10-CM

## 2021-07-11 DIAGNOSIS — I639 Cerebral infarction, unspecified: Secondary | ICD-10-CM | POA: Diagnosis not present

## 2021-07-11 NOTE — Progress Notes (Signed)
Patient ID: Carly [name], female   DOB: 08-05-1951, 70 y.o.   MRN: [id number]   Virtual Visit via video Note  This visit type was conducted due to national recommendations for restrictions regarding the COVID-19 pandemic (e.g. social distancing).  This format is felt to be most appropriate for this patient at this time.  All issues noted in this document were discussed and addressed.  No physical exam was performed (except for noted visual exam findings with Video Visits).   I connected with Carly [name] by a video enabled telemedicine application and verified that I am speaking with the correct person using two identifiers. Location patient: home Location provider: work  Persons participating in the virtual visit: patient, provider and pts husband.    The limitations, risks, security and privacy concerns of performing an evaluation and management service by video and the availability of in person appointments have been discussed.  It has also been discussed with the patient that there may be a patient responsible charge related to this service. The patient expressed understanding and agreed to proceed.   Reason for visit: follow up appt.   HPI: Reports her lower extremity swelling has improved.  Going to lymphedema clinic.  Taking seroquel.  Tolerating.  Feels helps.  Discussed blood pressure.  Taking amlodipine and losartan.  Blood pressure has been doing better overall.  Recently treated for respiratory infection.  Is better.  Still with raspy voice - will lose voice.  Discussed taking allegra.  ENT - planning sinus CT.  Scheduled to see Dr Raul Del 08/09/21.  No chest pain.  Breathing stable.  Eating.  Bowels better.  Has MS.  Discussed home health PT for strengtening.     ROS: See pertinent positives and negatives per HPI.  Past Medical History:  Diagnosis Date   Allergy    Anxiety    Aspiration pneumonia (HCC)    Depression    Frequent headaches    H/O   GERD  (gastroesophageal reflux disease)    History of chicken pox    History of colon polyps    Hx of migraines    Multiple sclerosis (HCC)    PONV (postoperative nausea and vomiting)     Past Surgical History:  Procedure Laterality Date   BACK SURGERY     CHOLECYSTECTOMY     COLONOSCOPY WITH PROPOFOL N/A 05/18/2017   Procedure: COLONOSCOPY WITH PROPOFOL;  Surgeon: [name], MD;  Location: [facility] ENDOSCOPY;  Service: Endoscopy;  Laterality: N/A;   FOOT SURGERY  2015   GALLBLADDER SURGERY  2008   HARDWARE REMOVAL Left 02/14/2016   Procedure: LEFT FOOT REMOVAL DEEP IMPLANT;  Surgeon: [name], MD;  Location: [place];  Service: Orthopedics;  Laterality: Left;   HERNIA REPAIR     Inguinal Hernia Repair   SPINE SURGERY  2014    Family History  Problem Relation Age of Onset   Arthritis Mother    Hypertension Mother    Macular degeneration Mother    Hypertension Father    Hyperlipidemia Father    Heart disease Maternal Grandfather    Diabetes Maternal Grandfather    Kidney disease Paternal Grandmother     SOCIAL HX: reviewed.    Current Outpatient Medications:    amLODipine (NORVASC) 5 MG tablet, Take 5 mg by mouth daily., Disp: , Rfl:    acetaminophen (TYLENOL) 500 MG tablet, Take 1 tablet (500 mg total) by mouth every 4 (four) hours as needed., Disp: 30 tablet, Rfl: 0  Baclofen 5 MG TABS, Take 1 tablet by mouth 3 (three) times daily., Disp: , Rfl:    Cholecalciferol (D3-1000 PO), Take 2,000 Units by mouth daily., Disp: , Rfl:    clopidogrel (PLAVIX) 75 MG tablet, TAKE 1 TABLET (75 MG) BY MOUTH EVERY DAY, Disp: 30 tablet, Rfl: 0   Coenzyme Q10 (CO Q10) 100 MG CAPS, Take 1 capsule by mouth daily., Disp: , Rfl:    dalfampridine 10 MG TB12, Take 10 mg by mouth every 12 (twelve) hours., Disp: , Rfl:    docusate sodium (COLACE) 100 MG capsule, Take 100 mg by mouth 2 (two) times daily., Disp: , Rfl:    DULoxetine (CYMBALTA) 60 MG capsule, TAKE 1 CAPSULE BY MOUTH  DAILY., Disp: 90 capsule, Rfl: 1   fexofenadine (ALLEGRA ALLERGY) 180 MG tablet, Take 1 tablet (180 mg total) by mouth daily., Disp: 30 tablet, Rfl: 2   fluticasone (FLONASE) 50 MCG/ACT nasal spray, Place 1 spray into both nostrils daily., Disp: 16 g, Rfl: 2   furosemide (LASIX) 40 MG tablet, Take 1 tablet (40 mg total) by mouth daily. (Patient not taking: Reported on 07/19/2021), Disp: 30 tablet, Rfl: 3   ketoconazole (NIZORAL) 2 % cream, SMARTSIG:1 Application Topical Morning-Night, Disp: , Rfl:    losartan (COZAAR) 50 MG tablet, TAKE 1 TABLET BY MOUTH ONCE DAILY, Disp: 90 tablet, Rfl: 1   magnesium oxide (MAG-OX) 400 MG tablet, TAKE 1 TABLET BY MOUTH DAILY, Disp: 30 tablet, Rfl: 5   METAMUCIL FIBER PO, Take by mouth 2 (two) times daily., Disp: , Rfl:    mirtazapine (REMERON SOL-TAB) 30 MG disintegrating tablet, Take 1 tablet (30 mg total) by mouth at bedtime., Disp: 30 tablet, Rfl: 3   nystatin cream (MYCOSTATIN), Apply 1 application topically 2 (two) times daily., Disp: 30 g, Rfl: 0   omeprazole (PRILOSEC) 20 MG capsule, Take 20 mg by mouth 2 (two) times daily before a meal., Disp: , Rfl:    QUEtiapine (SEROQUEL) 25 MG tablet, Take 1 tablet (25 mg total) by mouth at bedtime., Disp: 30 tablet, Rfl: 3   rosuvastatin (CRESTOR) 5 MG tablet, Take 1 tablet (5 mg total) by mouth daily., Disp: 90 tablet, Rfl: 1   saccharomyces boulardii (FLORASTOR) 250 MG capsule, Take 1 capsule (250 mg total) by mouth 2 (two) times daily., Disp: 60 capsule, Rfl: 0   sodium chloride (OCEAN) 0.65 % nasal spray, Place 1-2 sprays into the nose as needed., Disp: , Rfl:    topiramate (TOPAMAX) 25 MG tablet, Take 1 tablet (25 mg total) by mouth at bedtime., Disp: 30 tablet, Rfl: 3 No current facility-administered medications for this visit.  Facility-Administered Medications Ordered in Other Visits:    0.9 %  sodium chloride infusion, , Intravenous, Continuous, Corcoran, Melissa C, MD, Last Rate: 10 mL/hr at 04/25/16 0855,  New Bag at 04/25/16 0855   acetaminophen (TYLENOL) tablet 650 mg, 650 mg, Oral, Once, Corcoran, Drue Second, MD  EXAM:  VITALS per patient if applicable: 242/68  GENERAL: alert, oriented, appears well and in no acute distress  HEENT: atraumatic, conjunttiva clear, no obvious abnormalities on inspection of external nose and ears  NECK: normal movements of the head and neck  LUNGS: on inspection no signs of respiratory distress, breathing rate appears normal, no obvious gross SOB, gasping or wheezing  CV: no obvious cyanosis  PSYCH/NEURO: pleasant and cooperative, no obvious depression or anxiety, speech and thought processing grossly intact  ASSESSMENT AND PLAN:  Discussed the following assessment and plan:  Problem List Items Addressed This Visit     Depression, recurrent ([place])    Followed by psychiatry.  On seroquel.  Continues remeron.  Overall appears to be doing better. Follow.       History of CVA (cerebrovascular accident)    Continue risk factor modification.  Appears to be tolerating statin.  Blood pressure better.         Hypercholesterolemia    She appears to be tolerating the Crestor daily.  Follow-up fast lipid profile and liver function testing.        Relevant Medications   amLODipine (NORVASC) 5 MG tablet   Hyperglycemia    Low-carb diet and exercise.  Follow metabolic panel and M7E.      Hypertension    Blood pressure as outlined.  Continue losartan and amlodipine.  Follow pressures.  Follow metabolic panel.       Relevant Medications   amLODipine (NORVASC) 5 MG tablet   Lower extremity edema    PT - for treatment lymphedema.  Swelling has been better.        Multiple sclerosis ([place])    Followed by neurology.  Discussed need for strengthening exercise, etc.  Agreeable to home health PT to evaluate and treat.       Relevant Orders   Ambulatory referral to [place]    Return in about 5 weeks (around 08/15/2021) for follow up appt (85min) 4-6  weeks.  .   I discussed the assessment and treatment plan with the patient. The patient was provided an opportunity to ask questions and all were answered. The patient agreed with the plan and demonstrated an understanding of the instructions.   The patient was advised to call back or seek an in-person evaluation if the symptoms worsen or if the condition fails to improve as anticipated.    Einar Pheasant, MD

## 2021-07-16 DIAGNOSIS — G35 Multiple sclerosis: Secondary | ICD-10-CM | POA: Diagnosis not present

## 2021-07-17 ENCOUNTER — Other Ambulatory Visit: Payer: Self-pay | Admitting: Internal Medicine

## 2021-07-17 DIAGNOSIS — M5413 Radiculopathy, cervicothoracic region: Secondary | ICD-10-CM | POA: Diagnosis not present

## 2021-07-17 DIAGNOSIS — M9901 Segmental and somatic dysfunction of cervical region: Secondary | ICD-10-CM | POA: Diagnosis not present

## 2021-07-17 DIAGNOSIS — M7918 Myalgia, other site: Secondary | ICD-10-CM | POA: Diagnosis not present

## 2021-07-17 DIAGNOSIS — M542 Cervicalgia: Secondary | ICD-10-CM | POA: Diagnosis not present

## 2021-07-17 DIAGNOSIS — R519 Headache, unspecified: Secondary | ICD-10-CM | POA: Diagnosis not present

## 2021-07-17 DIAGNOSIS — Z1231 Encounter for screening mammogram for malignant neoplasm of breast: Secondary | ICD-10-CM

## 2021-07-17 DIAGNOSIS — M5412 Radiculopathy, cervical region: Secondary | ICD-10-CM | POA: Diagnosis not present

## 2021-07-17 DIAGNOSIS — M9900 Segmental and somatic dysfunction of head region: Secondary | ICD-10-CM | POA: Diagnosis not present

## 2021-07-17 DIAGNOSIS — M79601 Pain in right arm: Secondary | ICD-10-CM | POA: Diagnosis not present

## 2021-07-17 DIAGNOSIS — M79602 Pain in left arm: Secondary | ICD-10-CM | POA: Diagnosis not present

## 2021-07-18 ENCOUNTER — Ambulatory Visit
Admission: RE | Admit: 2021-07-18 | Discharge: 2021-07-18 | Disposition: A | Discharge status: Home / Self Care | Payer: Medicare Other | Source: Ambulatory Visit | Attending: Specialist | Admitting: Specialist

## 2021-07-18 ENCOUNTER — Ambulatory Visit: Payer: Medicare Other | Attending: Internal Medicine | Admitting: Occupational Therapy

## 2021-07-18 ENCOUNTER — Other Ambulatory Visit: Payer: Self-pay

## 2021-07-18 DIAGNOSIS — J321 Chronic frontal sinusitis: Secondary | ICD-10-CM | POA: Insufficient documentation

## 2021-07-18 DIAGNOSIS — R2681 Unsteadiness on feet: Secondary | ICD-10-CM | POA: Diagnosis not present

## 2021-07-18 DIAGNOSIS — R262 Difficulty in walking, not elsewhere classified: Secondary | ICD-10-CM | POA: Diagnosis not present

## 2021-07-18 DIAGNOSIS — I890 Lymphedema, not elsewhere classified: Secondary | ICD-10-CM | POA: Insufficient documentation

## 2021-07-18 DIAGNOSIS — R269 Unspecified abnormalities of gait and mobility: Secondary | ICD-10-CM | POA: Insufficient documentation

## 2021-07-18 DIAGNOSIS — M6281 Muscle weakness (generalized): Secondary | ICD-10-CM | POA: Insufficient documentation

## 2021-07-18 DIAGNOSIS — J329 Chronic sinusitis, unspecified: Secondary | ICD-10-CM | POA: Diagnosis not present

## 2021-07-18 DIAGNOSIS — R490 Dysphonia: Secondary | ICD-10-CM | POA: Diagnosis not present

## 2021-07-18 NOTE — Therapy (Signed)
[place] MAIN [facility] SERVICES [address] [place], [place], [address] Phone: [phone]   Fax:  [phone]  Occupational Therapy Treatment  Patient Details  Name: Carly [name] MRN: [id number] Date of Birth: 1951-01-25 Referring Provider (OT): Carly Pheasant, MD   Encounter Date: 07/18/2021   OT End of Session - 07/18/21 1513     Visit Number 5   Number of Visits 36    Date for OT Re-Evaluation 09/11/21    Authorization Type Initial eval 06/12/21    OT Start Time 0300    OT Stop Time 0410    OT Time Calculation (min) 70 min    Activity Tolerance Other (comment);Patient tolerated treatment well;No increased pain   limited by anxiety and perseveration   Behavior During Therapy [facility] for tasks assessed/performed             Past Medical History:  Diagnosis Date   Allergy    Anxiety    Aspiration pneumonia (HCC)    Depression    Frequent headaches    H/O   GERD (gastroesophageal reflux disease)    History of chicken pox    History of colon polyps    Hx of migraines    Multiple sclerosis (HCC)    PONV (postoperative nausea and vomiting)     Past Surgical History:  Procedure Laterality Date   BACK SURGERY     CHOLECYSTECTOMY     COLONOSCOPY WITH PROPOFOL N/A 05/18/2017   Procedure: COLONOSCOPY WITH PROPOFOL;  Surgeon: Carly Silvas, MD;  Location: [facility] ENDOSCOPY;  Service: Endoscopy;  Laterality: N/A;   FOOT SURGERY  2015   GALLBLADDER SURGERY  2008   HARDWARE REMOVAL Left 02/14/2016   Procedure: LEFT FOOT REMOVAL DEEP IMPLANT;  Surgeon: Carly Simmer, MD;  Location: [place];  Service: Orthopedics;  Laterality: Left;   HERNIA REPAIR     Inguinal Hernia Repair   SPINE SURGERY  2014    There were no vitals filed for this visit.   Subjective Assessment - 07/18/21 1515     Subjective  Carly [name] presents to OT for Rx visit  4/36 to addressBLE lymphedema (LE). Pt is accompanied by her  husband, Carly [name]. Pt reports B leg pain 0/ 10. Pt is accompanied by Carly [name] from [place]. Pt's spouse has been applying compression wraps to [name]'s LLE intermittently during their brief absence from clinic for medical procedure.    Patient is accompanied by: Family member    Pertinent History Relevant to limb swelling: Depression /anxiety, MS, HTN. 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 weakness,    Special Tests Intake FOTO: 29/100 (functional outcome score)    Patient Stated Goals to be able to walk better, to get this swelling down. I hate it.    Pain Onset Other (comment)   leg pain and swelling first noticed during long care trip ~ 7 hours                         OT Treatments/Exercises (OP) - 07/18/21 1614       ADLs   ADL Education Given Yes      Manual Therapy   Manual Therapy Edema management;Manual Lymphatic Drainage (MLD);Compression Bandaging    Manual Lymphatic Drainage (MLD) LLE/LLQ MLD utilizing  modified short neck sequence ( no lateral neck strokes) deep abdominal pathways (diaphragmatic breathing), functional inguinal LN, and proximal to distal strokes to thigh, bottleneck at knee, leg and foot. # retrograde full sequences from too to clavical to end. Good tolerance    Compression Bandaging LLE miultilayer gradient compresion wraps using single layer of  circumferentially aplied 0.4 cm thick Rosidal foam over cotton stockinet . Applied one 8 cm and 1 10 cm short stretch wrap over foam  using gradient techniques and Law of [place]                    OT Education - 07/18/21 1616     Education Details Continued Pt/ CG edu for lymphedema self care  and home program throughout session. Topics include multilayer, gradient compression wrapping, simple  self-MLD, therapeutic lymphatic pumping exercises, skin/nail care, risk reduction factors and LE precautions, compression garments/recommendations and wear and care schedule and compression garment donning / doffing using assistive devices. All questions answered to the Pt's satisfaction, and Pt demonstrates understanding by report.    Person(s) Educated Patient;Spouse    Methods Explanation;Demonstration;Handout;Verbal cues    Comprehension Verbalized understanding;Returned demonstration;Need further instruction;Verbal cues required                 OT Long Term Goals - 06/13/21 0900       OT LONG TERM GOAL #1   Title Given this patient's risk-adjustment variables, her Intake Functional Status score of 29/100 on the FOTO tool, patient will experience at least an increase in function of 3 points for a score of 32/100.    Baseline 97/100    Time 12    Period Weeks    Status New    Target Date 09/11/21      OT LONG TERM GOAL #2   Title Pt will be able to verbalize at least 4 lymphedema precautions and prevention strategies with Max caregiver assist to reduce infection risk limit progression of lymphedema.    Baseline dependent    Time 6    Period Days    Status New    Target Date --   6th OT Rx visit     OT LONG TERM GOAL #3   Title Pt will be able to apply multi-layer, short stretch compression wraps to one leg at a time using correct gradient techniques with max assistance in an effort  to return the affected  limb(s)  to premorbid size and shape, to limit pain and infection risk, and to improve functional mobility and ambulation  for ADLs.    Baseline dependent    Time 6    Period Days    Status New    Target Date --   6th OT Rx visit     OT LONG TERM GOAL #4   Title Pt will achieve at least a 10% limb volume reduction bilaterally to limit lymphedema progression, to improve tissue health and limit infection risk, and to increase tissue flexibility essential for optimal AROM  and safe functional ambulation and mobility.    Baseline dependent    Time 12    Period Weeks    Status New    Target Date 09/11/21      OT LONG TERM GOAL #5   Title With Max caregiver assistance Pt will achieve and sustain a least 85% compliance with all LE self-care home program components throughout Intensive Phase CDT, including frequent elevation when seated, daily skin inspection and care, lymphatic pumping ther  ex, 23/7 compression wraps and simple self-MLD, to sustain clinical gains made in CDT and to limit lymphedema progression and further functional decline.    Baseline dependent    Time 12    Period Weeks    Status New    Target Date 09/11/21      Long Term Additional Goals   Additional Long Term Goals Yes      OT LONG TERM GOAL #6   Title Using assistive devices (modified independence)  and Max CG assistance Pt will be able to don and doff appropriate daytime compression garments  to limit lymphatic re-accumulation and LE progression before transitioning to self-management phase of CDT.    Baseline dependent    Time 12    Period Weeks    Status New    Target Date 09/11/21                   Plan - 07/18/21 1610     Clinical Impression Statement Pt arrived with LLE compression wraps in place, but applied incorrectly. Moderate swelliing below the knee to toes unchanged since last visit before Rx course interrupted when CG had  medical procedure. Pt accompanied by paid caregiver, today, who assists w transfers, positioning and dressing. ZProvided LLE Mld    utilizing functional inguinal LN, deep lymphatics and modified short neck sequence. Pt tolerated manual therapy w/out pain. Reapplied multilayer wraps as established. CG made cell phone video a for reference at home. Cont as per POC.    OT Occupational Profile and History Comprehensive Assessment- Review of records and extensive additional review of physical, cognitive, psychosocial history related to current  functional performance    Occupational performance deficits (Please refer to evaluation for details): ADL's;IADL's;Work;Leisure;Social Participation;Other   body image   Body Structure / Function / Physical Skills ADL;Edema;Skin integrity;Pain;Decreased knowledge of precautions;Decreased knowledge of use of DME;IADL    Rehab Potential Fair    Clinical Decision Making Multiple treatment options, significant modification of task necessary    Comorbidities Affecting Occupational Performance: Presence of comorbidities impacting occupational performance    Comorbidities impacting occupational performance description: See SUBJECTIVE for relevant Dx    Modification or Assistance to Complete Evaluation  Max significant modification of tasks or assist is necessary to complete    OT Frequency 2x / week    OT Duration 12 weeks   and PRN for follow along support and caregiver support   OT Treatment/Interventions Self-care/ADL training;Therapeutic exercise;Coping strategies training;Therapeutic activities;Manual lymph drainage;Energy conservation;Manual Therapy;Other (comment);DME and/or AE instruction;Compression bandaging;Patient/family education   skin care to limit infection risk and increase skin flexibility   Plan Comple Decongestive Therapy: Manual Lymphatic Drainage (MLD, skin care, therapeutic exercise, compression therapy using short stretch    wraps and compression garments/ devices to retain clinical gains    Recommended Other Services Consider trial with advanced sequential pneumatic compression device, or "pump". Flexitouch device recommended over all as this stimulates lymphatics proximally to distal duplicating anatomic function. Need MD clearance due to Hx of CVA and MS. Pt may not tolerate 2/2 anxiety, but will explore her interest. Consider alternative to Amlodipine due to limb swelling side effect.    Consulted and Agree with Plan of Care Family member/caregiver;Patient    Family Member  Consulted Spouse, Bill             Patient will benefit from skilled therapeutic intervention in order to improve the following deficits and impairments:   Body Structure / Function / Physical Skills: ADL, Edema,  Skin integrity, Pain, Decreased knowledge of precautions, Decreased knowledge of use of DME, IADL       Visit Diagnosis: Lymphedema, not elsewhere classified    Problem List Patient Active Problem List   Diagnosis Date Noted   Rash 01/27/2021   History of CVA (cerebrovascular accident) 01/05/2021   PBA (pseudobulbar affect)    MS (multiple sclerosis) ([place])    Anxiety and depression    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   Generalized anxiety disorder 12/11/2020   Acute focal neurological deficit 12/08/2020   Edema 09/28/2020   Bloating 08/25/2020   Environmental allergies 07/17/2019   Hyperglycemia 07/17/2019   Cough 06/06/2019   Memory change 05/29/2019   Abnormal liver function tests 01/09/2019   Hemorrhoid 09/27/2018   Nausea 08/11/2018   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   Depression with anxiety 02/04/2018   Cognitive and behavioral changes 02/04/2018   Constipation 11/17/2017   Hx of adenomatous colonic polyps 06/06/2016   Lower extremity edema 01/13/2016   Bilateral ovarian cysts 10/16/2015   Health care maintenance 10/16/2015   Colon polyp 01/25/2015   Bone/cartilage disorder 01/25/2015   Headache 11/18/2014   Depression, recurrent ([place]) 11/18/2014   Greater tuberosity of humerus fracture 11/18/2014   Multiple sclerosis ([place]) 11/18/2014   Unsteady gait 11/18/2014   Dizziness 11/18/2014   Inconclusive mammogram 03/15/2013   S/P lumbar spinal fusion 01/04/2013   Surgery, other elective 12/03/2012   DDD (degenerative disc disease), lumbosacral 10/15/2012   Back ache 09/06/2012   Hypertension 09/06/2012   Back pain 09/06/2012   Hypercholesterolemia 01/28/2012   Climacteric 01/28/2012   Routine general medical examination  at a health care facility 01/28/2012   Avitaminosis D 01/28/2012   Andrey Spearman, MS, OTR/L, [facility] 07/18/21 4:17 PM    [place] [facility] MAIN [facility] SERVICES [address] [place], [place], [address] Phone: [phone]   Fax:  [phone]  Name: Carly [name] MRN: [id number] Date of Birth: Jun 26, 1951

## 2021-07-18 NOTE — Patient Instructions (Signed)

## 2021-07-19 ENCOUNTER — Encounter: Payer: Self-pay | Admitting: Physical Medicine and Rehabilitation

## 2021-07-19 ENCOUNTER — Encounter
Payer: Medicare Other | Attending: Physical Medicine and Rehabilitation | Admitting: Physical Medicine and Rehabilitation

## 2021-07-19 VITALS — BP 122/76 | HR 103 | Temp 99.0°F | Ht 63.0 in

## 2021-07-19 DIAGNOSIS — D518 Other vitamin B12 deficiency anemias: Secondary | ICD-10-CM | POA: Insufficient documentation

## 2021-07-19 DIAGNOSIS — E559 Vitamin D deficiency, unspecified: Secondary | ICD-10-CM | POA: Insufficient documentation

## 2021-07-19 DIAGNOSIS — J029 Acute pharyngitis, unspecified: Secondary | ICD-10-CM | POA: Diagnosis not present

## 2021-07-19 DIAGNOSIS — R5382 Chronic fatigue, unspecified: Secondary | ICD-10-CM | POA: Diagnosis not present

## 2021-07-19 DIAGNOSIS — R0981 Nasal congestion: Secondary | ICD-10-CM | POA: Insufficient documentation

## 2021-07-19 DIAGNOSIS — R635 Abnormal weight gain: Secondary | ICD-10-CM | POA: Diagnosis not present

## 2021-07-19 DIAGNOSIS — G4701 Insomnia due to medical condition: Secondary | ICD-10-CM | POA: Diagnosis not present

## 2021-07-19 DIAGNOSIS — F411 Generalized anxiety disorder: Secondary | ICD-10-CM | POA: Insufficient documentation

## 2021-07-19 MED ORDER — QUETIAPINE FUMARATE 25 MG PO TABS: 25.0000 mg | ORAL_TABLET | Freq: Every day | ORAL | 3 refills | Status: DC

## 2021-07-19 NOTE — Patient Instructions (Signed)
Anxiety: -Discussed exercise and meditation as tools to decrease anxiety. -Recommended Down Dog Yoga app -Discussed spending time outdoors. -Discussed positive re-framing of anxiety.  -Discussed the following foods that have been show to reduce anxiety: 1) [place] nuts, mushrooms, soy beans due to their high selenium content. Upper limit of toxicity of selenium is 462mcg/day so no more than 3-4 [place] nuts per day.  2) Fatty fish such as salmon, mackerel, sardines, trout, and herring- high in omega-3 fatty acids 3) Eggs- increases serotonin and dopamine 4) Pumpkin seeds- high in omega-3 fatty acids 5) dark chocolate- high in flavanols that increase blood flow to brain 6) turmeric- take with black pepper to increase absorption 7) chamomile tea- antioxidant and anti-inflammatory properties 8) yogurt without sugar- supports gut-brain axis 9) green tea- contains L- theanine 10) blueberries- high in vitamin C and antioxidants 11) [place]- high in tryptophan which gets converted to serotonin 12) bell peppers- rich in vitamin C and antioxidants 13) citrus fruits- rich in vitamin C and antioxidants 14) almonds- high in vitamin E and healthy fats 15) chia seeds- high in omega-3 fatty acids -Made goal to ____

## 2021-07-19 NOTE — Progress Notes (Signed)
Subjective:    Patient ID: Carly [name], female    DOB: 1951/02/06, 70 y.o.   MRN: [id number]  HPI    Carly [name] is a 70 year old woman who was admitted to CIR with white matter periventricular CVA, presents for follow-up regarding anxiety, insomnia, and weight gain.    1) Anxiety:  -her psychiatrist transitioned her from Seroquel back to benzodiazepine due to side effect profile of Seroquel. -since she stopped the Seroquel, her anxiety has increased, and she doesn't know what to do. -she and husband Carly [name] were scared of side effects of Seroquel that were discussed  -she has been sleeping but her legs feel very stiff at night -she continues to take Cymbalta 60mg  -she has been weaning off the [name]- currently weaned to 7.5 mg which she has been on today.  -she would like to wean completely off this medicine as I had told her it stimulates appetite and can contribute to her weight gain -she discussed with her husband, who is also present on this call today, and she would prefer to restart Seroquel and stop Lorazepam.  -she has not had any counseling and would like to restart this. She has an appointment with Dr. Sima [name] in August and would like to start counseling earlier than this if possible.  -she loved speaking with Dr. Sima [name] inpatient and would like to follow with him -she had symptoms of dizziness yesterday at the hairdresser and was asked to contact me regarding if this could be from the seroquel. She had had no other similar episodes since starting it.  -she has been sleeping well with the Seroquel at night.  -she would like a list of foods that can help anxiety.  -she has been using essential oil.  -the prednisone has caused weight gain.   2) MS: -has been having a lot of stiffness recently -EMS called twice due to difficulty getting her up off the floor -is receiving home therapy   3) Lymphedema  -has significant bilateral lower extremity  -has greatly  improved with lymphedema therapy -lasix 20mg  did not help enough.   4) HTN: SBP has been well controlled, but still with high sytolics of 557D at times  5) Decreased appetite:  -weaning off of [name] has caused decreased appetite and decreased energy.   6) She developed pain in right side, and not sure if this is due to topamax or the antibiotics she is on to treat a respiratory infection.   7) Insomnia:  -sleeping much better since she started back on [name]  8) Chest congestion -she has been having symptoms of congestion for greater than 3 months -Her father saw Dr. Renee [name] for pulmonary fibrosis that it was suspected he developed while working in tobacco fields. She received a call to schedule an appointment with Dr. Joanell [name] office and is thankful for the referral.  -she asks what else she can do to help with this -she has been through multiple rounds of antibiotics without benefit.  -also feeling diffuse joint aches.   9) GERD -has been experiencing after eating  10) Sinus congestion -had a CT yesterday, result not back yet  11)   Pain Inventory Average Pain 0 Pain Right Now 0 My pain is  no pain.  Sometimes spasm in legs at night because of MST  In the last 24 hours, has pain interfered with the following? General activity  n/a Relation with others  n/a Enjoyment of life  n/a  What TIME of  day is your pain at its worst? night Sleep (in general) Good  Pain is worse with: unsure Pain improves with: moving around Relief from Meds:  n/a  Family History  Problem Relation Age of Onset   Arthritis Mother    Hypertension Mother    Macular degeneration Mother    Hypertension Father    Hyperlipidemia Father    Heart disease Maternal Grandfather    Diabetes Maternal Grandfather    Kidney disease Paternal Grandmother    Social History   Socioeconomic History   Marital status: Married    Spouse name: Not on file   Number of children: Not on  file   Years of education: Not on file   Highest education level: Not on file  Occupational History   Not on file  Tobacco Use   Smoking status: Never   Smokeless tobacco: Never  Vaping Use   Vaping Use: Never used  Substance and Sexual Activity   Alcohol use: No    Alcohol/week: 0.0 standard drinks   Drug use: No   Sexual activity: Not Currently  Other Topics Concern   Not on file  Social History Narrative   Married    Social Determinants of Health   Financial Resource Strain: Low Risk    Difficulty of Paying Living Expenses: Not hard at all  Food Insecurity: No Food Insecurity   Worried About Charity fundraiser in the Last Year: Never true   Arboriculturist in the Last Year: Never true  Transportation Needs: No Transportation Needs   Lack of Transportation (Medical): No   Lack of Transportation (Non-Medical): No  Physical Activity: Not on file  Stress: No Stress Concern Present   Feeling of Stress : Not at all  Social Connections: Unknown   Frequency of Communication with Friends and Family: Not on file   Frequency of Social Gatherings with Friends and Family: Not on file   Attends Religious Services: Not on file   Active Member of Clubs or Organizations: Not on file   Attends Archivist Meetings: Not on file   Marital Status: Married   Past Surgical History:  Procedure Laterality Date   BACK SURGERY     CHOLECYSTECTOMY     COLONOSCOPY WITH PROPOFOL N/A 05/18/2017   Procedure: COLONOSCOPY WITH PROPOFOL;  Surgeon: [name], MD;  Location: [facility] ENDOSCOPY;  Service: Endoscopy;  Laterality: N/A;   FOOT SURGERY  2015   GALLBLADDER SURGERY  2008   HARDWARE REMOVAL Left 02/14/2016   Procedure: LEFT FOOT REMOVAL DEEP IMPLANT;  Surgeon: [name], MD;  Location: [place];  Service: Orthopedics;  Laterality: Left;   HERNIA REPAIR     Inguinal Hernia Repair   SPINE SURGERY  2014   Past Surgical History:  Procedure Laterality Date    BACK SURGERY     CHOLECYSTECTOMY     COLONOSCOPY WITH PROPOFOL N/A 05/18/2017   Procedure: COLONOSCOPY WITH PROPOFOL;  Surgeon: [name], MD;  Location: [facility] ENDOSCOPY;  Service: Endoscopy;  Laterality: N/A;   FOOT SURGERY  2015   GALLBLADDER SURGERY  2008   HARDWARE REMOVAL Left 02/14/2016   Procedure: LEFT FOOT REMOVAL DEEP IMPLANT;  Surgeon: [name], MD;  Location: [place];  Service: Orthopedics;  Laterality: Left;   HERNIA REPAIR     Inguinal Hernia Repair   SPINE SURGERY  2014   Past Medical History:  Diagnosis Date   Allergy    Anxiety  Aspiration pneumonia (HCC)    Depression    Frequent headaches    H/O   GERD (gastroesophageal reflux disease)    History of chicken pox    History of colon polyps    Hx of migraines    Multiple sclerosis (HCC)    PONV (postoperative nausea and vomiting)    There were no vitals taken for this visit.  Opioid Risk Score:   Fall Risk Score:  `1  Depression screen PHQ 2/9  Depression screen [facility] 2/9 06/18/2021 05/31/2021 03/28/2021 11/02/2020 07/12/2019 03/14/2019 03/10/2017  Decreased Interest 3 3 0 0 0 0 1  Down, Depressed, Hopeless 3 3 0 0 0 0 1  PHQ - 2 Score 6 6 0 0 0 0 2  Altered sleeping - - 0 - 0 0 0  Tired, decreased energy - - 0 - 0 0 0  Change in appetite - - 0 - 0 0 0  Feeling bad or failure about yourself  - - 0 - 0 0 0  Trouble concentrating - - 0 - 0 0 0  Moving slowly or fidgety/restless - - 0 - 0 0 0  Suicidal thoughts - - 0 - 0 0 0  PHQ-9 Score - - 0 - 0 0 2  Difficult doing work/chores - - Not difficult at all - Not difficult at all Not difficult at all Very difficult  Some recent data might be hidden    Review of Systems  Constitutional: Negative.   HENT: Negative.    Eyes: Negative.   Respiratory:  Negative for cough.        Chest congestion  Cardiovascular: Negative.   Gastrointestinal: Negative.   Endocrine: Negative.   Genitourinary: Negative.   Musculoskeletal:  Positive for back  pain and gait problem.  Skin: Negative.   Allergic/Immunologic: Negative.   Hematological:  Bruises/bleeds easily.       Plavix  Psychiatric/Behavioral:  Positive for dysphoric mood.   All other systems reviewed and are negative.     Objective:   Physical Exam Not performed as patient was seen via phone.      Assessment & Plan:  1) Anxiety: -discussed with patient the side effects of both Seroquel and Lorazepam, as well as potential interactions with other medications. -advised her to discuss with Bill the risks and benefits of both medications.  -discussed that we can follow-up tomorrow after their conversation to see what they decide -continue to encourage non-pharmacologic management approaches as well, such as meditation, applying lavender oil, and exercise- all of which she has been trying and which have been helping.  -discussed alternative anxiety medications.  -continue seroquel 12.5mg  daily and 25mg  at night. Can decreased to 25mg  at night and assess positive/negative effects during the day. Can restart 1/2 tab during the day if anxiety is uncontrolled.  -discontinue lorazepam.  -establish care with Dr. Sima [name] for neuropsych counseling on 8/2. They really valued the appointment with him.  -discontinue topamax in case pain in right side is due to medication- discussed can also be due to antibiotics which she is taking for respiratory infection- discontinuing the topamax will help [place] know if it is the cause of her pain or not. -continue [name] 30mg  HS.  -Discussed exercise and meditation as tools to decrease anxiety. -Recommended Down Dog Yoga app -Discussed spending time outdoors. -Discussed positive re-framing of anxiety.  -Discussed the following foods that have been show to reduce anxiety: 1) [place] nuts, mushrooms, soy beans due to their high selenium content.  Upper limit of toxicity of selenium is 431mcg/day so no more than 3-4 [place] nuts per day.  2) Fatty fish  such as salmon, mackerel, sardines, trout, and herring- high in omega-3 fatty acids 3) Eggs- increases serotonin and dopamine 4) Pumpkin seeds- high in omega-3 fatty acids 5) dark chocolate- high in flavanols that increase blood flow to brain 6) turmeric- take with black pepper to increase absorption 7) chamomile tea- antioxidant and anti-inflammatory properties 8) yogurt without sugar- supports gut-brain axis 9) green tea- contains L- theanine 10) blueberries- high in vitamin C and antioxidants 11) [place]- high in tryptophan which gets converted to serotonin 12) bell peppers- rich in vitamin C and antioxidants 13) citrus fruits- rich in vitamin C and antioxidants 14) almonds- high in vitamin E and healthy fats 15) chia seeds- high in omega-3 fatty acids  2) MS -continue home therapy to minimize stiffness/maximize mobility -f/u with MS specialist.   3) HTN: BP has been 130s-150s/80s, recommended citrus foods and nuts, as much mobility as she can tolerate, log Bps daily and bring log to f/u appointment.  -continue current regimen and checking and checking BP.  -advised citrus foods and nuts to help lower BP. Discussed that once BP is consistently 120/80 we can wean her Losartan to 25mg .   4) Bilateral lower extremity edema: - continue lymphedema therapy which is greatly helping.  -discussed response to Lasix. Increase Lasix to 40mg .   5) Insomnia: -recommended tart cherry juice with dinner, valerian root and chamomile teas in evening, applying lavender drops to forehead at night. -continue [name] 30mg  HS- discussed that we will not try to wean off again since we saw how much this is benefittng her  6) Obesity BMI 33.13 -attempted to wean off [name] but she developed worsening insomnia, anxiety, and decreased appetite that was undesirable to her, so have restarted the medication.   7) Chest congestion -referred for pulmonary consult with Dr. Renee [name], who treated her  father for pulmonary fibrosis. She has made an appointment to establish care with him.  -recommended drinking daily warm water with honey, lime and ginger -recommended drinking tea with holy Basil (Tulsi)  8) Sore throat: -continue salt water gargling  9) Sinus congestion: -recommended humidifier -drink 6-8 glasses of water per day.   10) fatigue -check B12, folate, TSH, vitamin D, magnesium

## 2021-07-20 ENCOUNTER — Encounter: Payer: Self-pay | Admitting: Internal Medicine

## 2021-07-20 NOTE — Assessment & Plan Note (Signed)
PT - for treatment lymphedema.  Swelling has been better.

## 2021-07-20 NOTE — Assessment & Plan Note (Signed)
Low-carb diet and exercise.  Follow metabolic panel and A1c. 

## 2021-07-20 NOTE — Assessment & Plan Note (Signed)
Followed by neurology.  Discussed need for strengthening exercise, etc.  Agreeable to home health PT to evaluate and treat.

## 2021-07-20 NOTE — Assessment & Plan Note (Signed)
Continue risk factor modification.  Appears to be tolerating statin.  Blood pressure better.

## 2021-07-20 NOTE — Assessment & Plan Note (Signed)
Blood pressure as outlined.  Continue losartan and amlodipine.  Follow pressures.  Follow metabolic panel.   

## 2021-07-20 NOTE — Assessment & Plan Note (Signed)
She appears to be tolerating the Crestor daily.  Follow-up fast lipid profile and liver function testing.

## 2021-07-20 NOTE — Assessment & Plan Note (Signed)
Followed by psychiatry.  On seroquel.  Continues remeron.  Overall appears to be doing better. Follow.

## 2021-07-22 ENCOUNTER — Telehealth: Payer: Self-pay

## 2021-07-22 ENCOUNTER — Other Ambulatory Visit: Payer: Self-pay | Admitting: Physical Medicine and Rehabilitation

## 2021-07-22 LAB — T4: T4, Total: 6.6 ug/dL (ref 4.5–12.0)

## 2021-07-22 LAB — SARS-COV-2 ANTIBODIES: SARS-CoV-2 Antibodies: NEGATIVE

## 2021-07-22 LAB — MAGNESIUM: Magnesium: 2.3 mg/dL (ref 1.6–2.3)

## 2021-07-22 LAB — VITAMIN D 25 HYDROXY (VIT D DEFICIENCY, FRACTURES): Vit D, 25-Hydroxy: 41.3 ng/mL (ref 30.0–100.0)

## 2021-07-22 LAB — B12 AND FOLATE PANEL
Folate: >20 ng/mL (ref >3.0)
Vitamin B-12: 490 pg/mL (ref 232–1245)

## 2021-07-22 LAB — TSH: TSH: 2.78 u[IU]/mL (ref 0.450–4.500)

## 2021-07-22 MED ORDER — SEROQUEL 25 MG PO TABS: ORAL_TABLET | ORAL | 3 refills | Status: DC

## 2021-07-22 NOTE — Telephone Encounter (Signed)
Please call Mr. Nakamura (Ph # [phone]). He does not think the right dose prescription was sent to the pharmacy.

## 2021-07-23 ENCOUNTER — Other Ambulatory Visit: Payer: Self-pay

## 2021-07-23 ENCOUNTER — Encounter (HOSPITAL_BASED_OUTPATIENT_CLINIC_OR_DEPARTMENT_OTHER): Payer: Medicare Other | Admitting: Physical Medicine and Rehabilitation

## 2021-07-23 DIAGNOSIS — H25813 Combined forms of age-related cataract, bilateral: Secondary | ICD-10-CM | POA: Diagnosis not present

## 2021-07-23 DIAGNOSIS — E559 Vitamin D deficiency, unspecified: Secondary | ICD-10-CM

## 2021-07-23 MED ORDER — VITAMIN D (ERGOCALCIFEROL) 1.25 MG (50000 UNIT) PO CAPS: 50000.0000 [IU] | ORAL_CAPSULE | ORAL | 0 refills | Status: DC

## 2021-07-23 NOTE — Progress Notes (Signed)
Discussed with husband that lab results are normal but that vitamin D is sub-optimal and I will send a high dose supplement for 7 weeks.

## 2021-07-24 ENCOUNTER — Other Ambulatory Visit: Payer: Self-pay

## 2021-07-24 ENCOUNTER — Ambulatory Visit: Payer: Medicare Other | Admitting: Occupational Therapy

## 2021-07-24 DIAGNOSIS — R269 Unspecified abnormalities of gait and mobility: Secondary | ICD-10-CM | POA: Diagnosis not present

## 2021-07-24 DIAGNOSIS — G35 Multiple sclerosis: Secondary | ICD-10-CM | POA: Diagnosis not present

## 2021-07-24 DIAGNOSIS — F5101 Primary insomnia: Secondary | ICD-10-CM | POA: Diagnosis not present

## 2021-07-24 DIAGNOSIS — I890 Lymphedema, not elsewhere classified: Secondary | ICD-10-CM

## 2021-07-24 DIAGNOSIS — R262 Difficulty in walking, not elsewhere classified: Secondary | ICD-10-CM | POA: Diagnosis not present

## 2021-07-24 DIAGNOSIS — M6281 Muscle weakness (generalized): Secondary | ICD-10-CM | POA: Diagnosis not present

## 2021-07-24 DIAGNOSIS — R2681 Unsteadiness on feet: Secondary | ICD-10-CM | POA: Diagnosis not present

## 2021-07-24 NOTE — Patient Instructions (Signed)

## 2021-07-24 NOTE — Therapy (Signed)
[place] MAIN [facility] SERVICES [address] [place], [place], [address] Phone: [phone]   Fax:  [phone]  Occupational Therapy Treatment  Patient Details  Name: Carly [name] MRN: [id number] Date of Birth: 08/01/1951 Referring Provider (OT): Einar Pheasant, MD   Encounter Date: 07/24/2021   OT End of Session - 07/24/21 1216     Visit Number 5    OT Start Time 3825    OT Stop Time 1205    OT Time Calculation (min) 60 min    Equipment Utilized During Treatment Circaid Juxtafit sample    Activity Tolerance Patient tolerated treatment well;No increased pain    Behavior During Therapy WFL for tasks assessed/performed             Past Medical History:  Diagnosis Date   Allergy    Anxiety    Aspiration pneumonia (HCC)    Depression    Frequent headaches    H/O   GERD (gastroesophageal reflux disease)    History of chicken pox    History of colon polyps    Hx of migraines    Multiple sclerosis (HCC)    PONV (postoperative nausea and vomiting)     Past Surgical History:  Procedure Laterality Date   BACK SURGERY     CHOLECYSTECTOMY     COLONOSCOPY WITH PROPOFOL N/A 05/18/2017   Procedure: COLONOSCOPY WITH PROPOFOL;  Surgeon: [name], MD;  Location: [facility] ENDOSCOPY;  Service: Endoscopy;  Laterality: N/A;   FOOT SURGERY  2015   GALLBLADDER SURGERY  2008   HARDWARE REMOVAL Left 02/14/2016   Procedure: LEFT FOOT REMOVAL DEEP IMPLANT;  Surgeon: [name], MD;  Location: [facility];  Service: Orthopedics;  Laterality: Left;   HERNIA REPAIR     Inguinal Hernia Repair   SPINE SURGERY  2014    There were no vitals filed for this visit.   Subjective Assessment - 07/24/21 1121     Subjective  Carly [name] presents to OT for Rx visit  5/36 to addressBLE lymphedema (LE). Pt is accompanied by her husband, Carly [name]. Pt reports B leg pain 0/ 10. Pt is accompanied by Carly [name] from [place]. Pt's spouse has been applying compression wraps to Carly [name]'s LLE daily between visits.    Patient is accompanied by: Family member    Pertinent History Relevant to limb swelling: Depression /anxiety, MS, HTN. 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 weakness,    Special Tests Intake FOTO: 29/100 (functional outcome score)    Patient Stated Goals to be able to walk better, to get this swelling down. I hate it.    Pain Onset Other (comment)   leg pain and swelling first noticed during long care trip ~ 7 hours                         OT Treatments/Exercises (OP) - 07/24/21 1222       ADLs   ADL Education Given Yes      Manual Therapy   Manual Therapy Edema management;Manual Lymphatic Drainage (MLD);Compression Bandaging    Manual Lymphatic Drainage (MLD) LLE/LLQ MLD utilizing modified short neck sequence ( no lateral neck strokes) deep abdominal pathways (diaphragmatic breathing), functional inguinal LN, and proximal to distal strokes to thigh, bottleneck at  knee, leg and foot. # retrograde full sequences from too to clavical to end. Good tolerance    Compression Bandaging LLE miultilayer gradient compresion wraps using single layer of  circumferentially aplied 0.4 cm thick Rosidal foam over cotton stockinet . Applied one 8 cm and 1 10 cm short stretch wrap over foam  using gradient techniques and Law of [place]                    OT Education - 07/24/21 1226     Education Details Continued Pt/ CG edu for lymphedema self care  and home program throughout session. Topics include multilayer, gradient compression wrapping, simple self-MLD, therapeutic lymphatic pumping exercises, skin/nail care, risk reduction factors and LE precautions, compression garments/recommendations and wear  and care schedule and compression garment donning / doffing using assistive devices. All questions answered to the Pt's satisfaction, and Pt demonstrates understanding by report.    Person(s) Educated Patient;Spouse    Methods Explanation;Demonstration;Handout;Verbal cues    Comprehension Verbalized understanding;Returned demonstration;Need further instruction;Verbal cues required                 OT Long Term Goals - 06/13/21 0900       OT LONG TERM GOAL #1   Title Given this patient's risk-adjustment variables, her Intake Functional Status score of 29/100 on the FOTO tool, patient will experience at least an increase in function of 3 points for a score of 32/100.    Baseline 97/100    Time 12    Period Weeks    Status New    Target Date 09/11/21      OT LONG TERM GOAL #2   Title Pt will be able to verbalize at least 4 lymphedema precautions and prevention strategies with Max caregiver assist to reduce infection risk limit progression of lymphedema.    Baseline dependent    Time 6    Period Days    Status New    Target Date --   6th OT Rx visit     OT LONG TERM GOAL #3   Title Pt will be able to apply multi-layer, short stretch compression wraps to one leg at a time using correct gradient techniques with max assistance in an effort  to return the affected  limb(s)  to premorbid size and shape, to limit pain and infection risk, and to improve functional mobility and ambulation  for ADLs.    Baseline dependent    Time 6    Period Days    Status New    Target Date --   6th OT Rx visit     OT LONG TERM GOAL #4   Title Pt will achieve at least a 10% limb volume reduction bilaterally to limit lymphedema progression, to improve tissue health and limit infection risk, and to increase tissue flexibility essential for optimal AROM and safe functional ambulation and mobility.    Baseline dependent    Time 12    Period Weeks    Status New    Target Date 09/11/21      OT LONG TERM  GOAL #5   Title With Max caregiver assistance Pt will achieve and sustain a least 85% compliance with all LE self-care home program components throughout Intensive Phase CDT, including frequent elevation when seated, daily skin inspection and care, lymphatic pumping ther ex, 23/7 compression wraps and simple self-MLD, to sustain clinical gains made in CDT and to limit lymphedema progression and further functional decline.  Baseline dependent    Time 12    Period Weeks    Status New    Target Date 09/11/21      Long Term Additional Goals   Additional Long Term Goals Yes      OT LONG TERM GOAL #6   Title Using assistive devices (modified independence)  and Max CG assistance Pt will be able to don and doff appropriate daytime compression garments  to limit lymphatic re-accumulation and LE progression before transitioning to self-management phase of CDT.    Baseline dependent    Time 12    Period Weeks    Status New    Target Date 09/11/21                   Plan - 07/24/21 1118     Clinical Impression Statement Pt arrived with LLE compression wraps in place. Pt denies pain in legs . Carly [name] tells me that he thinks he's mastered compression wrapping. Upon inspection, wraps not applied correctly again today, but leg swelling is very well controlled and Pt is very comfortable in  his wraps . OT reviewed gradient wrapping techniques with Pt, spouse and private duty assistant at end of session. LLE is well reduced! Provided Pt and CG edu throughout session Pt and CG educated re basic vs advanced sequential pneumatic compression devices, or "pumps". Discussed pros and contraindications. Discussed process for verifying benefits with manufacturer's rep and trial before buying. Pt and spouse interested in trial. Provided Pt and CG edu re wrap style, adjustable, alternatives to compression garments. Demonstratefd CircAid Juxtafit essential and discussed pros and cons. Pt not quite ready for LLE  daytime compression garments yet, but the CircAids are an option bc of caregiver friendliness. Pt understands the biggest drawback    to this system is the limited foot compression.Cont as per POC.    OT Occupational Profile and History Comprehensive Assessment- Review of records and extensive additional review of physical, cognitive, psychosocial history related to current functional performance    Occupational performance deficits (Please refer to evaluation for details): ADL's;IADL's;Work;Leisure;Social Participation;Other   body image   Body Structure / Function / Physical Skills ADL;Edema;Skin integrity;Pain;Decreased knowledge of precautions;Decreased knowledge of use of DME;IADL    Rehab Potential Fair    Clinical Decision Making Multiple treatment options, significant modification of task necessary    Comorbidities Affecting Occupational Performance: Presence of comorbidities impacting occupational performance    Comorbidities impacting occupational performance description: See SUBJECTIVE for relevant Dx    Modification or Assistance to Complete Evaluation  Max significant modification of tasks or assist is necessary to complete    OT Frequency 2x / week    OT Duration 12 weeks   and PRN for follow along support and caregiver support   OT Treatment/Interventions Self-care/ADL training;Therapeutic exercise;Coping strategies training;Therapeutic activities;Manual lymph drainage;Energy conservation;Manual Therapy;Other (comment);DME and/or AE instruction;Compression bandaging;Patient/family education   skin care to limit infection risk and increase skin flexibility   Plan Comple Decongestive Therapy: Manual Lymphatic Drainage (MLD, skin care, therapeutic exercise, compression therapy using short stretch    wraps and compression garments/ devices to retain clinical gains    Recommended Other Services Consider trial with advanced sequential pneumatic compression device, or "pump". Flexitouch device  recommended over all as this stimulates lymphatics proximally to distal duplicating anatomic function. Need MD clearance due to Hx of CVA and MS. Pt may not tolerate 2/2 anxiety, but will explore her interest. Consider alternative to Amlodipine due to limb swelling side  effect.    Consulted and Agree with Plan of Care Family member/caregiver;Patient    Family Member Consulted Spouse, Carly [name]             Patient will benefit from skilled therapeutic intervention in order to improve the following deficits and impairments:   Body Structure / Function / Physical Skills: ADL, Edema, Skin integrity, Pain, Decreased knowledge of precautions, Decreased knowledge of use of DME, IADL       Visit Diagnosis: Lymphedema, not elsewhere classified    Problem List Patient Active Problem List   Diagnosis Date Noted   Rash 01/27/2021   History of CVA (cerebrovascular accident) 01/05/2021   PBA (pseudobulbar affect)    MS (multiple sclerosis) ([place])    Anxiety and depression    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   Generalized anxiety disorder 12/11/2020   Acute focal neurological deficit 12/08/2020   Edema 09/28/2020   Bloating 08/25/2020   Environmental allergies 07/17/2019   Hyperglycemia 07/17/2019   Cough 06/06/2019   Memory change 05/29/2019   Abnormal liver function tests 01/09/2019   Hemorrhoid 09/27/2018   Nausea 08/11/2018   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   Depression with anxiety 02/04/2018   Cognitive and behavioral changes 02/04/2018   Constipation 11/17/2017   Hx of adenomatous colonic polyps 06/06/2016   Lower extremity edema 01/13/2016   Bilateral ovarian cysts 10/16/2015   Health care maintenance 10/16/2015   Colon polyp 01/25/2015   Bone/cartilage disorder 01/25/2015   Headache 11/18/2014   Depression, recurrent ([place]) 11/18/2014   Greater tuberosity of humerus fracture 11/18/2014   Multiple sclerosis ([place]) 11/18/2014   Unsteady gait 11/18/2014    Dizziness 11/18/2014   Inconclusive mammogram 03/15/2013   S/P lumbar spinal fusion 01/04/2013   Surgery, other elective 12/03/2012   DDD (degenerative disc disease), lumbosacral 10/15/2012   Back ache 09/06/2012   Hypertension 09/06/2012   Back pain 09/06/2012   Hypercholesterolemia 01/28/2012   Climacteric 01/28/2012   Routine general medical examination at a health care facility 01/28/2012   Avitaminosis D 01/28/2012    Ansel Bong, OT/L 07/24/2021, 12:27 PM  [place] MAIN [facility] SERVICES [place], [place], [address] Phone: [phone]   Fax:  [phone]  Name: Carly [name] MRN: [id number] Date of Birth: 1951/05/04

## 2021-07-25 ENCOUNTER — Telehealth: Payer: Self-pay

## 2021-07-25 DIAGNOSIS — M542 Cervicalgia: Secondary | ICD-10-CM | POA: Diagnosis not present

## 2021-07-25 DIAGNOSIS — M79601 Pain in right arm: Secondary | ICD-10-CM | POA: Diagnosis not present

## 2021-07-25 DIAGNOSIS — M7918 Myalgia, other site: Secondary | ICD-10-CM | POA: Diagnosis not present

## 2021-07-25 DIAGNOSIS — M5413 Radiculopathy, cervicothoracic region: Secondary | ICD-10-CM | POA: Diagnosis not present

## 2021-07-25 DIAGNOSIS — M9900 Segmental and somatic dysfunction of head region: Secondary | ICD-10-CM | POA: Diagnosis not present

## 2021-07-25 DIAGNOSIS — M79602 Pain in left arm: Secondary | ICD-10-CM | POA: Diagnosis not present

## 2021-07-25 DIAGNOSIS — M5412 Radiculopathy, cervical region: Secondary | ICD-10-CM | POA: Diagnosis not present

## 2021-07-25 DIAGNOSIS — M9901 Segmental and somatic dysfunction of cervical region: Secondary | ICD-10-CM | POA: Diagnosis not present

## 2021-07-25 DIAGNOSIS — R519 Headache, unspecified: Secondary | ICD-10-CM | POA: Diagnosis not present

## 2021-07-25 NOTE — Telephone Encounter (Signed)
Patient called requesting a copy of the diet that was discussed in the last visit.

## 2021-07-25 NOTE — Telephone Encounter (Signed)
Left voicemail to return call to clinic.

## 2021-07-29 DIAGNOSIS — J310 Chronic rhinitis: Secondary | ICD-10-CM | POA: Diagnosis not present

## 2021-07-29 DIAGNOSIS — R0609 Other forms of dyspnea: Secondary | ICD-10-CM | POA: Diagnosis not present

## 2021-07-29 DIAGNOSIS — R053 Chronic cough: Secondary | ICD-10-CM | POA: Diagnosis not present

## 2021-07-29 DIAGNOSIS — G479 Sleep disorder, unspecified: Secondary | ICD-10-CM | POA: Diagnosis not present

## 2021-07-30 ENCOUNTER — Encounter (HOSPITAL_BASED_OUTPATIENT_CLINIC_OR_DEPARTMENT_OTHER): Payer: Medicare Other | Admitting: Physical Medicine and Rehabilitation

## 2021-07-30 ENCOUNTER — Other Ambulatory Visit: Payer: Self-pay

## 2021-07-30 DIAGNOSIS — G4701 Insomnia due to medical condition: Secondary | ICD-10-CM

## 2021-07-30 DIAGNOSIS — F411 Generalized anxiety disorder: Secondary | ICD-10-CM

## 2021-07-30 DIAGNOSIS — R635 Abnormal weight gain: Secondary | ICD-10-CM | POA: Diagnosis not present

## 2021-07-30 NOTE — Progress Notes (Signed)
Subjective:    Patient ID: Carly [name], female    DOB: 1950/12/07, 70 y.o.   MRN: [id number]  HPI  An audio/video tele-health visit is felt to be the most appropriate encounter for this patient at this time. This is a follow up tele-visit via phone. The patient is at home. MD is at office.     Carly [name] is a 70 year old woman who was admitted to CIR with white matter periventricular CVA, presents for follow-up regarding anxiety, insomnia, and weight gain.    1) Anxiety:  -her psychiatrist transitioned her from Seroquel back to benzodiazepine due to side effect profile of Seroquel. -tried trialing off of Seroquel and this worsened her anxiety and so we restarted it.  -she and husband [name] were scared of side effects of Seroquel that were discussed- she has not noted any this far.  -she has been sleeping but her legs feel very stiff at night -she continues to take Cymbalta 60mg  -we tried weaning her off the mirtazepine but she experienced weakness and insomnia and wanted to restart- titrated back up to her former dose.  -she discussed with her husband, who is also present on this call today, and she would prefer to restart Seroquel and stop Lorazepam.  -she has not had any counseling and would like to restart this. She has an appointment with [name] in August and would like to start counseling earlier than this if possible.  -she loved speaking with [name] inpatient and would like to follow with him -she had symptoms of dizziness yesterday at the hairdresser and was asked to contact me regarding if this could be from the seroquel. She had had no other similar episodes since starting it.  -she has been sleeping well with the Seroquel at night.  -she would like a list of foods that can help anxiety.  -she asks about the dietary advice I gave her last visit regarding anxiety. -she has been using essential oil.  -the prednisone has caused weight gain.   2) MS: -has  been having a lot of stiffness recently -EMS called twice due to difficulty getting her up off the floor -is receiving home therapy   3) Lymphedema  -has significant bilateral lower extremity  -has greatly improved with lymphedema therapy -lasix 20mg  did not help enough.   4) HTN: SBP has been well controlled, but still with high sytolics of 237S at times  5) Decreased appetite:  -weaning off of [name] has caused decreased appetite and decreased energy.   6) She developed pain in right side, and not sure if this is due to topamax or the antibiotics she is on to treat a respiratory infection.   7) Insomnia:  -sleeping much better since she started back on [name]  8) Chest congestion -she has been having symptoms of congestion for greater than 3 months -Her father saw 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 She received a call to schedule an appointment with [name] office and is thankful for the referral.  -she asks what else she can do to help with this -she has been through multiple rounds of antibiotics without benefit.  -also feeling diffuse joint aches.   9) GERD -has been experiencing after eating  10) Sinus congestion -pleased that her CT results were normal.     Pain Inventory Average Pain 0 Pain Right Now 0 My pain is  no pain.  Sometimes  spasm in legs at night because of MST  In the last 24 hours, has pain interfered with the following? General activity  n/a Relation with others  n/a Enjoyment of life  n/a  What TIME of day is your pain at its worst? night Sleep (in general) Good  Pain is worse with: unsure Pain improves with: moving around Relief from Meds:  n/a  Family History  Problem Relation Age of Onset   Arthritis Mother    Hypertension Mother    Macular degeneration Mother    Hypertension Father    Hyperlipidemia Father    Heart disease Maternal  Grandfather    Diabetes Maternal Grandfather    Kidney disease Paternal Grandmother    Social History   Socioeconomic History   Marital status: Married    Spouse name: Not on file   Number of children: Not on file   Years of education: Not on file   Highest education level: Not on file  Occupational History   Not on file  Tobacco Use   Smoking status: Never   Smokeless tobacco: Never  Vaping Use   Vaping Use: Never used  Substance and Sexual Activity   Alcohol use: No    Alcohol/week: 0.0 standard drinks   Drug use: No   Sexual activity: Not Currently  Other Topics Concern   Not on file  Social History Narrative   Married    Social Determinants of Health   Financial Resource Strain: Low Risk    Difficulty of Paying Living Expenses: Not hard at all  Food Insecurity: No Food Insecurity   Worried About Charity fundraiser in the Last Year: Never true   Arboriculturist in the Last Year: Never true  Transportation Needs: No Transportation Needs   Lack of Transportation (Medical): No   Lack of Transportation (Non-Medical): No  Physical Activity: Not on file  Stress: No Stress Concern Present   Feeling of Stress : Not at all  Social Connections: Unknown   Frequency of Communication with Friends and Family: Not on file   Frequency of Social Gatherings with Friends and Family: Not on file   Attends Religious Services: Not on file   Active Member of Clubs or Organizations: Not on file   Attends Archivist Meetings: Not on file   Marital Status: Married   Past Surgical History:  Procedure Laterality Date   BACK SURGERY     CHOLECYSTECTOMY     COLONOSCOPY WITH PROPOFOL N/A 05/18/2017   Procedure: COLONOSCOPY WITH PROPOFOL;  Surgeon: [name], MD;  Location: [facility] ENDOSCOPY;  Service: Endoscopy;  Laterality: N/A;   FOOT SURGERY  2015   GALLBLADDER SURGERY  2008   HARDWARE REMOVAL Left 02/14/2016   Procedure: LEFT FOOT REMOVAL DEEP IMPLANT;  Surgeon: [name], MD;  Location: [place];  Service: Orthopedics;  Laterality: Left;   HERNIA REPAIR     Inguinal Hernia Repair   SPINE SURGERY  2014   Past Surgical History:  Procedure Laterality Date   BACK SURGERY     CHOLECYSTECTOMY     COLONOSCOPY WITH PROPOFOL N/A 05/18/2017   Procedure: COLONOSCOPY WITH PROPOFOL;  Surgeon: [name], MD;  Location[facility] ENDOSCOPY;  Service: Endoscopy;  Laterality: N/A;   FOOT SURGERY  2015   GALLBLADDER SURGERY  2008   HARDWARE REMOVAL Left 02/14/2016   Procedure: LEFT FOOT REMOVAL DEEP IMPLANT;  Surgeon: [name], MD;  Location: [place];  Service: Orthopedics;  Laterality: Left;   HERNIA REPAIR     Inguinal Hernia Repair   SPINE SURGERY  2014   Past Medical History:  Diagnosis Date   Allergy    Anxiety    Aspiration pneumonia (HCC)    Depression    Frequent headaches    H/O   GERD (gastroesophageal reflux disease)    History of chicken pox    History of colon polyps    Hx of migraines    Multiple sclerosis (HCC)    PONV (postoperative nausea and vomiting)    There were no vitals taken for this visit.  Opioid Risk Score:   Fall Risk Score:  `1  Depression screen PHQ 2/9  Depression screen [facility] 2/9 07/19/2021 06/18/2021 05/31/2021 03/28/2021 11/02/2020 07/12/2019 03/14/2019  Decreased Interest 1 3 3  0 0 0 0  Down, Depressed, Hopeless 1 3 3  0 0 0 0  PHQ - 2 Score 2 6 6  0 0 0 0  Altered sleeping - - - 0 - 0 0  Tired, decreased energy - - - 0 - 0 0  Change in appetite - - - 0 - 0 0  Feeling bad or failure about yourself  - - - 0 - 0 0  Trouble concentrating - - - 0 - 0 0  Moving slowly or fidgety/restless - - - 0 - 0 0  Suicidal thoughts - - - 0 - 0 0  PHQ-9 Score - - - 0 - 0 0  Difficult doing work/chores - - - Not difficult at all - Not difficult at all Not difficult at all  Some recent data might be hidden    Review of Systems  Constitutional: Negative.   HENT: Negative.    Eyes: Negative.    Respiratory:  Negative for cough.        Chest congestion  Cardiovascular: Negative.   Gastrointestinal: Negative.   Endocrine: Negative.   Genitourinary: Negative.   Musculoskeletal:  Positive for back pain and gait problem.  Skin: Negative.   Allergic/Immunologic: Negative.   Hematological:  Bruises/bleeds easily.       Plavix  Psychiatric/Behavioral:  Positive for dysphoric mood.   All other systems reviewed and are negative.     Objective:   Physical Exam Not performed as patient was seen via phone.      Assessment & Plan:  1) Anxiety: -discussed with patient the side effects of both Seroquel and Lorazepam, as well as potential interactions with other medications. -advised her to discuss with Bill the risks and benefits of both medications.  -discussed that we can follow-up tomorrow after their conversation to see what they decide -continue to encourage non-pharmacologic management approaches as well, such as meditation, applying lavender oil, and exercise- all of which she has been trying and which have been helping.  -discussed alternative anxiety medications.  -continue seroquel 12.5mg  daily and 25mg  at night. Can decreased to 25mg  at night and assess positive/negative effects during the day. Can restart 1/2 tab during the day if anxiety is uncontrolled.  -discontinue lorazepam.  -establish care with [name] for neuropsych counseling on 8/2. They really valued the appointment with him.  -discontinue topamax in case pain in right side is due to medication- discussed can also be due to antibiotics which she is taking for respiratory infection- discontinuing the topamax will help [place] know if it is the cause of her pain or not. -continue [name] 30mg  HS.  -Discussed exercise and meditation as tools to decrease  anxiety. -Recommended Down Dog Yoga app -Discussed spending time outdoors. -Discussed positive re-framing of anxiety.  -Discussed the following foods that  have been show to reduce anxiety: 1) [place] nuts, mushrooms, soy beans due to their high selenium content. Upper limit of toxicity of selenium is 42mcg/day so no more than 3-4 [place] nuts per day.  2) Fatty fish such as salmon, mackerel, sardines, trout, and herring- high in omega-3 fatty acids 3) Eggs- increases serotonin and dopamine 4) Pumpkin seeds- high in omega-3 fatty acids 5) dark chocolate- high in flavanols that increase blood flow to brain 6) turmeric- take with black pepper to increase absorption 7) chamomile tea- antioxidant and anti-inflammatory properties 8) yogurt without sugar- supports gut-brain axis 9) green tea- contains L- theanine 10) blueberries- high in vitamin C and antioxidants 11) [place]- high in tryptophan which gets converted to serotonin 12) bell peppers- rich in vitamin C and antioxidants 13) citrus fruits- rich in vitamin C and antioxidants 14) almonds- high in vitamin E and healthy fats 15) chia seeds- high in omega-3 fatty acids  2) MS -continue home therapy to minimize stiffness/maximize mobility -f/u with MS specialist.   3) HTN: BP has been 130s-150s/80s, recommended citrus foods and nuts, as much mobility as she can tolerate, log Bps daily and bring log to f/u appointment.  -continue current regimen and checking and checking BP.  -advised citrus foods and nuts to help lower BP. Discussed that once BP is consistently 120/80 we can wean her Losartan to 25mg .   4) Bilateral lower extremity edema: - continue lymphedema therapy which is greatly helping.  -discussed response to Lasix. Increase Lasix to 40mg .   5) Insomnia: -recommended tart cherry juice with dinner, valerian root and chamomile teas in evening, applying lavender drops to forehead at night. -continue [name] 30mg  HS- discussed that we will not try to wean off again since we saw how much this is benefittng her  6) Obesity BMI 33.13 -attempted to wean off [name] but she  developed worsening insomnia, anxiety, and decreased appetite that was undesirable to her, so have restarted the medication.  -discussed trial of avoiding gluten for 3 months and how this could potentially help with her weight loss, anxiety, and prevention/slowing of her chronic diseases. -discussed benefits of intermittent fasting- made goal to set a consistent dinner time and avoid snacking after this time. If she is hungry, choose a healthy snack of nuts, fruit, or vegetables.   7) Chest congestion -referred for pulmonary consult with [name], who treated her father for pulmonary fibrosis. She has made an appointment to establish care with him.  -recommended drinking daily warm water with honey, lime and ginger -recommended drinking tea with holy Basil (Tulsi)  8) Sore throat: -continue salt water gargling  9) Sinus congestion: -recommended humidifier -drink 6-8 glasses of water per day.   10) fatigue -checked B12, folate, TSH, vitamin D, magnesium and discussed bloodwork with patient and husband: all in normal range except for suboptimal Vitamin D- high dose supplement prescribed  10 minutes spent in discussion of her bloodwork, dietary recommendation of avoiding gluten, discussion of what products contain gluten, discussion of the benefits of intermittent fasting for weight loss/prevention or slowing or her chronic diseases

## 2021-07-30 NOTE — Telephone Encounter (Signed)
Spoke with patient and she wants the list of foods mailed to her. Letter place in outgoing mail.

## 2021-08-01 ENCOUNTER — Other Ambulatory Visit: Payer: Self-pay

## 2021-08-01 ENCOUNTER — Ambulatory Visit: Payer: Medicare Other | Admitting: Occupational Therapy

## 2021-08-01 DIAGNOSIS — R262 Difficulty in walking, not elsewhere classified: Secondary | ICD-10-CM | POA: Diagnosis not present

## 2021-08-01 DIAGNOSIS — R2681 Unsteadiness on feet: Secondary | ICD-10-CM | POA: Diagnosis not present

## 2021-08-01 DIAGNOSIS — M6281 Muscle weakness (generalized): Secondary | ICD-10-CM | POA: Diagnosis not present

## 2021-08-01 DIAGNOSIS — R269 Unspecified abnormalities of gait and mobility: Secondary | ICD-10-CM | POA: Diagnosis not present

## 2021-08-01 DIAGNOSIS — I890 Lymphedema, not elsewhere classified: Secondary | ICD-10-CM

## 2021-08-01 NOTE — Therapy (Signed)
[place] MAIN [facility] SERVICES [address] [place], [place], [address] Phone: [phone]   Fax:  [phone]  Occupational Therapy Treatment  Patient Details  Name: Carly [name] MRN: [id number] Date of Birth: [birth year] Referring Provider (OT): Einar Pheasant, MD   Encounter Date: 08/01/2021   OT End of Session - 08/01/21 1610     Visit Number 6    Number of Visits 36    Date for OT Re-Evaluation 09/11/21    Authorization Type Initial eval 06/12/21    OT Start Time 0255    OT Stop Time 0410    OT Time Calculation (min) 75 min    Activity Tolerance Other (comment);Patient tolerated treatment well;No increased pain   limited by anxiety and perseveration   Behavior During Therapy [facility] for tasks assessed/performed             Past Medical History:  Diagnosis Date   Allergy    Anxiety    Aspiration pneumonia (HCC)    Depression    Frequent headaches    H/O   GERD (gastroesophageal reflux disease)    History of chicken pox    History of colon polyps    Hx of migraines    Multiple sclerosis (HCC)    PONV (postoperative nausea and vomiting)     Past Surgical History:  Procedure Laterality Date   BACK SURGERY     CHOLECYSTECTOMY     COLONOSCOPY WITH PROPOFOL N/A 05/18/2017   Procedure: COLONOSCOPY WITH PROPOFOL;  Surgeon: [name], MD;  Location: [facility] ENDOSCOPY;  Service: Endoscopy;  Laterality: N/A;   FOOT SURGERY  2015   GALLBLADDER SURGERY  2008   HARDWARE REMOVAL Left 02/14/2016   Procedure: LEFT FOOT REMOVAL DEEP IMPLANT;  Surgeon: [name], MD;  Location: [place];  Service: Orthopedics;  Laterality: Left;   HERNIA REPAIR     Inguinal Hernia Repair   SPINE SURGERY  2014    There were no vitals filed for this visit.   Subjective Assessment - 08/01/21 1622     Subjective  Keyarra DAVIANNA [name] presents to OT for Rx visit  6/36 to addressBLE lymphedema (LE). Pt is accompanied by her  husband, [name]. and paid attendant. Pt denies le related pain.    Patient is accompanied by: Family member    Pertinent History Relevant to limb swelling: Depression /anxiety, MS, HTN. 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 weakness,    Special Tests Intake FOTO: 29/100 (functional outcome score)    Patient Stated Goals to be able to walk better, to get this swelling down. I hate it.    Pain Onset Other (comment)   leg pain and swelling first noticed during long care trip ~ 7 hours                         OT Treatments/Exercises (OP) - 08/01/21 1623       ADLs   ADL Education Given Yes      Manual Therapy   Manual Therapy Edema management    Edema Management anatomical measurements for compression knee highs    Manual Lymphatic Drainage (MLD) LLE/LLQ MLD utilizing modified short neck sequence ( no lateral neck strokes) deep abdominal pathways (diaphragmatic breathing), functional inguinal LN, and proximal to distal strokes  to thigh, bottleneck at knee, leg and foot. # retrograde full sequences from too to clavical to end. Good tolerance    Compression Bandaging LLE miultilayer gradient compresion wraps using single layer of  circumferentially aplied 0.4 cm thick Rosidal foam over cotton stockinet . Applied one 8 cm and 1 10 cm short stretch wrap over foam  using gradient techniques and Law of Krupp                    OT Education - 08/01/21 1624     Education Details Continued Pt/ CG edu for lymphedema self care  and home program throughout session. Topics include multilayer, gradient compression wrapping, simple self-MLD, therapeutic lymphatic pumping exercises, skin/nail care, risk reduction factors and LE precautions, compression garments/recommendations and wear and care schedule  and compression garment donning / doffing using assistive devices. All questions answered to the Pt's satisfaction, and Pt demonstrates understanding by report.    Person(s) Educated Patient;Spouse    Methods Explanation;Demonstration;Handout;Verbal cues    Comprehension Verbalized understanding;Returned demonstration;Need further instruction;Verbal cues required                 OT Long Term Goals - 06/13/21 0900       OT LONG TERM GOAL #1   Title Given this patient's risk-adjustment variables, her Intake Functional Status score of 29/100 on the FOTO tool, patient will experience at least an increase in function of 3 points for a score of 32/100.    Baseline 97/100    Time 12    Period Weeks    Status New    Target Date 09/11/21      OT LONG TERM GOAL #2   Title Pt will be able to verbalize at least 4 lymphedema precautions and prevention strategies with Max caregiver assist to reduce infection risk limit progression of lymphedema.    Baseline dependent    Time 6    Period Days    Status New    Target Date --   6th OT Rx visit     OT LONG TERM GOAL #3   Title Pt will be able to apply multi-layer, short stretch compression wraps to one leg at a time using correct gradient techniques with max assistance in an effort  to return the affected  limb(s)  to premorbid size and shape, to limit pain and infection risk, and to improve functional mobility and ambulation  for ADLs.    Baseline dependent    Time 6    Period Days    Status New    Target Date --   6th OT Rx visit     OT LONG TERM GOAL #4   Title Pt will achieve at least a 10% limb volume reduction bilaterally to limit lymphedema progression, to improve tissue health and limit infection risk, and to increase tissue flexibility essential for optimal AROM and safe functional ambulation and mobility.    Baseline dependent    Time 12    Period Weeks    Status New    Target Date 09/11/21      OT LONG TERM GOAL #5   Title  With Max caregiver assistance Pt will achieve and sustain a least 85% compliance with all LE self-care home program components throughout Intensive Phase CDT, including frequent elevation when seated, daily skin inspection and care, lymphatic pumping ther ex, 23/7 compression wraps and simple self-MLD, to sustain clinical gains made in CDT and to limit lymphedema progression and further functional  decline.    Baseline dependent    Time 12    Period Weeks    Status New    Target Date 09/11/21      Long Term Additional Goals   Additional Long Term Goals Yes      OT LONG TERM GOAL #6   Title Using assistive devices (modified independence)  and Max CG assistance Pt will be able to don and doff appropriate daytime compression garments  to limit lymphatic re-accumulation and LE progression before transitioning to self-management phase of CDT.    Baseline dependent    Time 12    Period Weeks    Status New    Target Date 09/11/21                   Plan - 08/01/21 1611     Clinical Impression Statement Provided lle mld and skin care without increased pain. Completed anatomical measurements for off the shelf compression knee highs. Spouse will reapply wraps  when they get home due to time constraints. Cont as per POC.    OT Occupational Profile and History Comprehensive Assessment- Review of records and extensive additional review of physical, cognitive, psychosocial history related to current functional performance    Occupational performance deficits (Please refer to evaluation for details): ADL's;IADL's;Work;Leisure;Social Participation;Other   body image   Body Structure / Function / Physical Skills ADL;Edema;Skin integrity;Pain;Decreased knowledge of precautions;Decreased knowledge of use of DME;IADL    Rehab Potential Fair    Clinical Decision Making Multiple treatment options, significant modification of task necessary    Comorbidities Affecting Occupational Performance: Presence of  comorbidities impacting occupational performance    Comorbidities impacting occupational performance description: See SUBJECTIVE for relevant Dx    Modification or Assistance to Complete Evaluation  Max significant modification of tasks or assist is necessary to complete    OT Frequency 2x / week    OT Duration 12 weeks   and PRN for follow along support and caregiver support   OT Treatment/Interventions Self-care/ADL training;Therapeutic exercise;Coping strategies training;Therapeutic activities;Manual lymph drainage;Energy conservation;Manual Therapy;Other (comment);DME and/or AE instruction;Compression bandaging;Patient/family education   skin care to limit infection risk and increase skin flexibility   Plan Comple Decongestive Therapy: Manual Lymphatic Drainage (MLD, skin care, therapeutic exercise, compression therapy using short stretch    wraps and compression garments/ devices to retain clinical gains    Recommended Other Services Consider trial with advanced sequential pneumatic compression device, or "pump". Flexitouch device recommended over all as this stimulates lymphatics proximally to distal duplicating anatomic function. Need MD clearance due to Hx of CVA and MS. Pt may not tolerate 2/2 anxiety, but will explore her interest. Consider alternative to Amlodipine due to limb swelling side effect.    Consulted and Agree with Plan of Care Family member/caregiver;Patient    Family Member Consulted Spouse, Bill             Patient will benefit from skilled therapeutic intervention in order to improve the following deficits and impairments:   Body Structure / Function / Physical Skills: ADL, Edema, Skin integrity, Pain, Decreased knowledge of precautions, Decreased knowledge of use of DME, IADL       Visit Diagnosis: Lymphedema, not elsewhere classified    Problem List Patient Active Problem List   Diagnosis Date Noted   Rash 01/27/2021   History of CVA (cerebrovascular  accident) 01/05/2021   PBA (pseudobulbar affect)    MS (multiple sclerosis) ([place])    Anxiety and depression  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   Generalized anxiety disorder 12/11/2020   Acute focal neurological deficit 12/08/2020   Edema 09/28/2020   Bloating 08/25/2020   Environmental allergies 07/17/2019   Hyperglycemia 07/17/2019   Cough 06/06/2019   Memory change 05/29/2019   Abnormal liver function tests 01/09/2019   Hemorrhoid 09/27/2018   Nausea 08/11/2018   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   Depression with anxiety 02/04/2018   Cognitive and behavioral changes 02/04/2018   Constipation 11/17/2017   Hx of adenomatous colonic polyps 06/06/2016   Lower extremity edema 01/13/2016   Bilateral ovarian cysts 10/16/2015   Health care maintenance 10/16/2015   Colon polyp 01/25/2015   Bone/cartilage disorder 01/25/2015   Headache 11/18/2014   Depression, recurrent ([place]) 11/18/2014   Greater tuberosity of humerus fracture 11/18/2014   Multiple sclerosis ([place]) 11/18/2014   Unsteady gait 11/18/2014   Dizziness 11/18/2014   Inconclusive mammogram 03/15/2013   S/P lumbar spinal fusion 01/04/2013   Surgery, other elective 12/03/2012   DDD (degenerative disc disease), lumbosacral 10/15/2012   Back ache 09/06/2012   Hypertension 09/06/2012   Back pain 09/06/2012   Hypercholesterolemia 01/28/2012   Climacteric 01/28/2012   Routine general medical examination at a health care facility 01/28/2012   Avitaminosis D 01/28/2012    Andrey Spearman, MS, OTR/L, [facility] 08/01/21 4:25 PM   [place] [facility] MAIN [facility] SERVICES [address] [place], [place], [address] Phone: [phone]   Fax:  [phone]  Name: DRIANNA CHANDRAN MRN: [id number] Date of Birth: [birth year]

## 2021-08-03 ENCOUNTER — Other Ambulatory Visit: Payer: Self-pay | Admitting: Internal Medicine

## 2021-08-03 DIAGNOSIS — R531 Weakness: Secondary | ICD-10-CM

## 2021-08-03 DIAGNOSIS — Z8673 Personal history of transient ischemic attack (TIA), and cerebral infarction without residual deficits: Secondary | ICD-10-CM

## 2021-08-03 NOTE — Progress Notes (Signed)
Order placed for physical therapy referral.

## 2021-08-05 DIAGNOSIS — K219 Gastro-esophageal reflux disease without esophagitis: Secondary | ICD-10-CM | POA: Diagnosis not present

## 2021-08-05 DIAGNOSIS — J310 Chronic rhinitis: Secondary | ICD-10-CM | POA: Diagnosis not present

## 2021-08-05 DIAGNOSIS — R490 Dysphonia: Secondary | ICD-10-CM | POA: Diagnosis not present

## 2021-08-05 DIAGNOSIS — H6123 Impacted cerumen, bilateral: Secondary | ICD-10-CM | POA: Diagnosis not present

## 2021-08-06 ENCOUNTER — Ambulatory Visit: Payer: Medicare Other

## 2021-08-06 ENCOUNTER — Other Ambulatory Visit: Payer: Self-pay | Admitting: Internal Medicine

## 2021-08-06 DIAGNOSIS — H2512 Age-related nuclear cataract, left eye: Secondary | ICD-10-CM | POA: Diagnosis not present

## 2021-08-06 DIAGNOSIS — Z01818 Encounter for other preprocedural examination: Secondary | ICD-10-CM | POA: Diagnosis not present

## 2021-08-06 DIAGNOSIS — H25811 Combined forms of age-related cataract, right eye: Secondary | ICD-10-CM | POA: Diagnosis not present

## 2021-08-08 ENCOUNTER — Encounter: Payer: Self-pay | Admitting: Physical Therapy

## 2021-08-08 ENCOUNTER — Ambulatory Visit: Payer: Medicare Other | Admitting: Occupational Therapy

## 2021-08-08 ENCOUNTER — Ambulatory Visit: Payer: Medicare Other | Admitting: Physical Therapy

## 2021-08-08 ENCOUNTER — Other Ambulatory Visit: Payer: Self-pay

## 2021-08-08 DIAGNOSIS — R2681 Unsteadiness on feet: Secondary | ICD-10-CM | POA: Diagnosis not present

## 2021-08-08 DIAGNOSIS — M6281 Muscle weakness (generalized): Secondary | ICD-10-CM | POA: Diagnosis not present

## 2021-08-08 DIAGNOSIS — R262 Difficulty in walking, not elsewhere classified: Secondary | ICD-10-CM

## 2021-08-08 DIAGNOSIS — R269 Unspecified abnormalities of gait and mobility: Secondary | ICD-10-CM

## 2021-08-08 DIAGNOSIS — I890 Lymphedema, not elsewhere classified: Secondary | ICD-10-CM | POA: Diagnosis not present

## 2021-08-08 NOTE — Therapy (Signed)
[place] MAIN [facility] SERVICES [place], [place], [address] Phone: [phone]   Fax:  [phone]  Physical Therapy Evaluation  Patient Details  Name: Carly [name] MRN: [id number] Date of Birth: 1951/02/02 Referring Provider (PT): Einar Pheasant MD   Encounter Date: 08/08/2021   PT End of Session - 08/08/21 1714     Visit Number 1    Number of Visits 24    Date for PT Re-Evaluation 10/31/21    Authorization Time Period 08/08/21-10/31/20    Progress Note Due on Visit 10    PT Start Time 1600    PT Stop Time 1700    PT Time Calculation (min) 60 min    Equipment Utilized During Treatment Gait belt    Activity Tolerance Patient tolerated treatment well;Treatment limited secondary to agitation    Behavior During Therapy WFL for tasks assessed/performed             Past Medical History:  Diagnosis Date   Allergy    Anxiety    Aspiration pneumonia ([place])    Depression    Frequent headaches    H/O   GERD (gastroesophageal reflux disease)    History of chicken pox    History of colon polyps    Hx of migraines    Multiple sclerosis (HCC)    PONV (postoperative nausea and vomiting)     Past Surgical History:  Procedure Laterality Date   BACK SURGERY     CHOLECYSTECTOMY     COLONOSCOPY WITH PROPOFOL N/A 05/18/2017   Procedure: COLONOSCOPY WITH PROPOFOL;  Surgeon: [name], MD;  Location: [facility] ENDOSCOPY;  Service: Endoscopy;  Laterality: N/A;   FOOT SURGERY  2015   GALLBLADDER SURGERY  2008   HARDWARE REMOVAL Left 02/14/2016   Procedure: LEFT FOOT REMOVAL DEEP IMPLANT;  Surgeon: [name], MD;  Location: [place];  Service: Orthopedics;  Laterality: Left;   HERNIA REPAIR     Inguinal Hernia Repair   SPINE SURGERY  2014    There were no vitals filed for this visit.    Subjective Assessment - 08/08/21 1603     Subjective Pt reports she is in PT on order to allow for improvement  with walking. Pt reports she has not been able to get [facility] PT because she is being [facility]. Pt reports she was walking  and her caregiver who helped her walk had a heart attackand is no longer workig for her. Pt reports the last time she was walking regularly was about 2 years ago. She reports she gets up to [facility] with some assistance from her husband. Pt has walker, rollator and WC at home. Pt reports she used them often until her caregiver had the heart attack. Pt switched to new MS medication a couple years ago and attributes some of her dificits to the medicaiton change. Pt has significant difficulty with any activities involving standing. Pt lives in home with ramp to enter. House is one story with an upstairs but she lives primarilly on the first floor. Pt has shower which she can access and a whirlpool tub in her bathroom she cannot access. 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 She has weighted bracelets she has been using for arm strength. Pt reports she has not been to outpatient PT in the past but she has had [place] in the past. Pt reports swimming in past for exercise. She also  reports she is moving to [facility] in January. Caregiver reports 3 falls in the last 6 months.    Patient is accompained by: Family member    Pertinent History Pt reports she is in PT on order to allow for improvement with walking. Pt reports she has not been able to get [facility] PT because she is being seen by outpatient OT services. Pt reports she was walking  and her caregiver who helped her walk had a heart attack. Pt reports the last time she was walking regularly was about 2 years ago. She reports she gets up to [facility] with some assistance from her husband. Pt has walker, rollator and WC at home. Pt reports she used them often until her caregiver had the heart attack. Pt switched to new MS medication a couple years ago and attributes some of her dificits to the medicaiton  change. Pt has significant difficulty with any activities involving standing. Pt lives in home wiht ramp to enter. House is one story with an upstairs but she lives primarilly on the first floor. Pt has shower which she can access. 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 She has weighted bracelets she has been using for arm strength. Pt reports she has not been to outpatient PT in the past but she has had [place] [facility] in the past. Pt reports swimming in past for exercise. She also reports she is moving to [facility] in January.    Limitations Standing;Walking    Patient Stated Goals Standing, walking, transfers    Currently in Pain? No/denies                [facility] PT Assessment - 08/08/21 0001       Assessment   Medical Diagnosis Multiple Sclerosis    Referring Provider (PT) Einar Pheasant MD    Hand Dominance Right    Next MD Visit 09/2021    Prior Therapy no outpatient PT for this condition      Precautions   Precautions Fall      Balance Screen   Has the patient fallen in the past 6 months Yes    How many times? 3    Has the patient had a decrease in activity level because of a fear of falling?  Yes    Is the patient reluctant to leave their home because of a fear of falling?  Yes      [place] Private residence    Living Arrangements Spouse/significant other    Type of [place] entrance      Prior Function   Level of Independence Needs assistance with ADLs      Observation/Other Assessments   Focus on Therapeutic Outcomes (FOTO)  Intake 28/100               FOTO score: 28        Objective measurements completed on examination: See above findings.    Gross UE strength: 4/5 on left 4-/5 on right     PAIN: N/A        STRENGTH:  Graded on a 0-5 scale Muscle Group Left Right  Shoulder flex /5 /5  Shoulder Abd /5 /5  Shoulder Ext /5 /5  Shoulder IR/ER /5 /5  Elbow /5  /5  Wrist/hand /5 /5  Hip Flex 4-/5 3+/5  Hip Abd 4-/5 4-/5  Hip Add 4/5 4/5  Hip Ext /5 /5  Hip IR/ER /5 /5  Knee Flex 4-/5 4-/5  Knee Ext 4-/5 4-/5  Ankle DF 3+/5 3+/5  Ankle PF 3+/5 3+/5   SENSATION:  Mildly impaired LUE, possibly due to cognition Mild impairment L LE   NEUROLOGICAL SCREEN: (2+ unless otherwise noted.) N=normal  Ab=abnormal   Level Dermatome R L  C3 Anterior Neck  N N  C4 Top of Shoulder N N  C5 Lateral Upper Arm  N A  C6 Lateral Arm/ Thumb  N N  C7 Middle Finger  N N  C8 4th & 5th Finger N N  T1 Medial Arm N N  L2 Medial thigh/groin N A  L3 Lower thigh/med.knee N A  L4 Medial leg/lat thigh N A  L5 Lat. leg & dorsal foot N   S1 post/lat foot/thigh/leg N   S2 Post./med. thigh & leg N     SOMATOSENSORY:  Any N & T in extremities or weakness: reports :         Sensation           Intact      Diminished         Absent  Light touch LEs X                                 FUNCTIONAL MOBILITY: Patient unable to transition from sit to stand with without max assistance from physical therapist.  Patient able to transition from sit to stand utilizing bilateral lower extremities in parallel bars and min assist from author   BALANCE: Static Sitting Balance  Normal Able to maintain balance against maximal resistance   Good Able to maintain balance against moderate resistance   Good-/Fair+ Accepts minimal resistance x  Fair Able to sit unsupported without balance loss and without UE support   Poor+ Able to maintain with Minimal assistance from individual or chair   Poor Unable to maintain balance-requires mod/max support from individual or chair    Static Standing Balance  Normal Able to maintain standing balance against maximal resistance   Good Able to maintain standing balance against moderate resistance   Good-/Fair+ Able to maintain standing balance against minimal resistance   Fair Able to stand unsupported without UE support and without LOB  for 1-2 min   Fair- Requires Min A and UE support to maintain standing without loss of balance x  Poor+ Requires mod A and UE support to maintain standing without loss of balance   Poor Requires max A and UE support to maintain standing balance without loss    Dynamic Sitting Balance  Normal Able to sit unsupported and weight shift across midline maximally   Good Able to sit unsupported and weight shift across midline moderately   Good-/Fair+ Able to sit unsupported and weight shift across midline minimally x  Fair Minimal weight shifting ipsilateral/front, difficulty crossing midline   Fair- Reach to ipsilateral side and unable to weight shift   Poor + Able to sit unsupported with min A and reach to ipsilateral side, unable to weight shift   Poor Able to sit unsupported with mod A and reach ipsilateral/front-can't cross midline       GAIT: Not assessed at this time.  OUTCOME MEASURES: TEST Outcome Interpretation  5 times sit<>stand Patient unable to complete 1 sit to stand without assistance. >64 yo, >15 sec indicates increased risk for falls  FIST test 28   FOTO 28              PT Education - 08/08/21 1712     Education provided Yes    Education Details POC and next visit plan    Person(s) Educated Patient    Methods Explanation    Comprehension Verbalized understanding              PT Short Term Goals - 08/08/21 1724       PT SHORT TERM GOAL #1   Title Patient will be independent in home exercise program to improve strength/mobility for better functional independence with ADLs.    Baseline Pt does not have HEP    Time 4    Period Weeks    Status New    Target Date 09/05/21      PT SHORT TERM GOAL #2   Title Patient will increase FOTO score to equal to or greater than 4    to demonstrate statistically significant improvement in mobility and quality of life.    Baseline 28    Time 4    Period Weeks    Target Date 09/05/21                PT Long Term Goals - 08/08/21 1720       PT LONG TERM GOAL #1   Title Patient will be independent with advanced and progressive home program for strength and endurance in order to transition to self management    Baseline Pt does not have current formal HEP    Time 4    Period Weeks    Status New    Target Date 10/03/21      PT LONG TERM GOAL #2   Title pt's FOTO score will improve by 4 points or more indicating improved confidence with daily tasks at home    Baseline 28 at intake    Time 6    Period Weeks    Status New      PT LONG TERM GOAL #3   Title Patient will deny any falls over past 6 weeks to demonstrate improved safety awareness at home and work.    Baseline Pt reports 3 falls in previous 6 months    Time 6    Period Weeks    Status New      PT LONG TERM GOAL #4   Title Patient will perform sit to and from stand from her wheelchair with min assist and no upper extremity support in order to indicate improvement in function.    Baseline Patient requires mod assist from author and bilateral lower extremity support to transition to and from sitting    Time 12    Period Weeks    Status New    Target Date 10/31/21                    Plan - 08/08/21 1716     Clinical Impression Statement Patient is a 70 year old female who presents to physical therapy with deficits in mobility and balance.  Patient has had diagnosis of MS for several years and has noticed a decrease in functional capacity over the last year or so.  Patient presents with deficits in lower extremity strength as well as ambulatory and functional capacity as demonstrated by inability to perform sit to stand without significant upper extremity assistance and min assistance from author.  Patient has history of falls with 3 falls in the last year indicating  safety with home activities.  Patient also utilizes wheelchair as primary means of ambulation outside of her home.  Patient will benefit from  skilled physical therapy intervention in order to improve her lower extremity strength, balance, mobility, and overall function.    Personal Factors and Comorbidities Age;Comorbidity 1;Comorbidity 2;Comorbidity 3+;Time since onset of injury/illness/exacerbation    Comorbidities Depression, anxiety, HTN, MS, DDD, GERD    Examination-Activity Limitations Bed Mobility;Carry;Continence;Dressing;Hygiene/Grooming;Lift;Locomotion Level;Squat;Stairs;Stand;Toileting;Transfers    Examination-Participation Restrictions Cleaning;Community Activity;Laundry;Meal Prep;Shop    Stability/Clinical Decision Making Evolving/Moderate complexity    Clinical Decision Making High    Rehab Potential Fair    Clinical Impairments Affecting Rehab Potential (+) family support, motivated (-) MS, decreased confidence, co morbidities    PT Frequency 2x / week    PT Duration 12 weeks    PT Treatment/Interventions Patient/family education;Gait training;Neuromuscular re-education;Therapeutic exercise;Manual techniques;ADLs/Self Care Home Management;Stair training;DME Instruction;Functional mobility training;Therapeutic activities;Balance training;Wheelchair mobility training;Energy conservation;Joint Manipulations    PT Next Visit Plan exercises for strength, endurance, private treatmetn room, develop HEP    PT Home Exercise Plan TO establish next session    Consulted and Agree with Plan of Care Patient             Patient will benefit from skilled therapeutic intervention in order to improve the following deficits and impairments:  Decreased strength, Decreased balance, Decreased activity tolerance, Decreased endurance, Difficulty walking, Abnormal gait, Improper body mechanics, Decreased mobility, Hypomobility  Visit Diagnosis: Abnormality of gait and mobility  Difficulty in walking, not elsewhere classified  Muscle weakness (generalized)  Unsteadiness on feet     Problem List Patient Active Problem List    Diagnosis Date Noted   Rash 01/27/2021   History of CVA (cerebrovascular accident) 01/05/2021   PBA (pseudobulbar affect)    MS (multiple sclerosis) (HCC)    Anxiety and depression    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   Generalized anxiety disorder 12/11/2020   Acute focal neurological deficit 12/08/2020   Edema 09/28/2020   Bloating 08/25/2020   Environmental allergies 07/17/2019   Hyperglycemia 07/17/2019   Cough 06/06/2019   Memory change 05/29/2019   Abnormal liver function tests 01/09/2019   Hemorrhoid 09/27/2018   Nausea 08/11/2018   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   Depression with anxiety 02/04/2018   Cognitive and behavioral changes 02/04/2018   Constipation 11/17/2017   Hx of adenomatous colonic polyps 06/06/2016   Lower extremity edema 01/13/2016   Bilateral ovarian cysts 10/16/2015   Health care maintenance 10/16/2015   Colon polyp 01/25/2015   Bone/cartilage disorder 01/25/2015   Headache 11/18/2014   Depression, recurrent ([place]) 11/18/2014   Greater tuberosity of humerus fracture 11/18/2014   Multiple sclerosis ([place]) 11/18/2014   Unsteady gait 11/18/2014   Dizziness 11/18/2014   Inconclusive mammogram 03/15/2013   S/P lumbar spinal fusion 01/04/2013   Surgery, other elective 12/03/2012   DDD (degenerative disc disease), lumbosacral 10/15/2012   Back ache 09/06/2012   Hypertension 09/06/2012   Back pain 09/06/2012   Hypercholesterolemia 01/28/2012   Climacteric 01/28/2012   Routine general medical examination at a health care facility 01/28/2012   Avitaminosis D 01/28/2012    Particia Lather, PT 08/08/2021, 5:40 PM  [place] MAIN [facility] SERVICES [address] [place], [place], [address] Phone: [phone]   Fax:  [phone]  Name: EVONNE RINKS MRN: [id number] Date of Birth: Jun 22, 1951

## 2021-08-08 NOTE — Therapy (Signed)
[place] MAIN [facility] SERVICES [address] [place], [place], [address] Phone: [phone]   Fax:  [phone]  Occupational Therapy Treatment  Patient Details  Name: Carly [name] MRN: [id number] Date of Birth: [birth year] Referring Provider (OT): Einar Pheasant, MD   Encounter Date: 08/08/2021   OT End of Session - 08/08/21 1618     Number of Visits 36    Date for OT Re-Evaluation 09/11/21    Authorization Type Initial eval 06/12/21    OT Start Time 0300    OT Stop Time 0400    OT Time Calculation (min) 60 min    Activity Tolerance Other (comment);Patient tolerated treatment well;No increased pain   limited by anxiety and perseveration   Behavior During Therapy [facility] for tasks assessed/performed             Past Medical History:  Diagnosis Date   Allergy    Anxiety    Aspiration pneumonia (HCC)    Depression    Frequent headaches    H/O   GERD (gastroesophageal reflux disease)    History of chicken pox    History of colon polyps    Hx of migraines    Multiple sclerosis (HCC)    PONV (postoperative nausea and vomiting)     Past Surgical History:  Procedure Laterality Date   BACK SURGERY     CHOLECYSTECTOMY     COLONOSCOPY WITH PROPOFOL N/A 05/18/2017   Procedure: COLONOSCOPY WITH PROPOFOL;  Surgeon: [name], MD;  Location: [facility] ENDOSCOPY;  Service: Endoscopy;  Laterality: N/A;   FOOT SURGERY  2015   GALLBLADDER SURGERY  2008   HARDWARE REMOVAL Left 02/14/2016   Procedure: LEFT FOOT REMOVAL DEEP IMPLANT;  Surgeon: [name], MD;  Location: [place];  Service: Orthopedics;  Laterality: Left;   HERNIA REPAIR     Inguinal Hernia Repair   SPINE SURGERY  2014    There were no vitals filed for this visit.   Subjective Assessment - 08/08/21 1515     Subjective  Carly [name] presents to OT for Rx visit  7/36 to addressBLE lymphedema (LE). Pt is accompanied by her husband, Carly [name]. and paid  attendant. Pt denies le related pain.    Patient is accompanied by: Family member    Pertinent History Relevant to limb swelling: Depression /anxiety, MS, HTN. 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 weakness,    Special Tests Intake FOTO: 29/100 (functional outcome score)    Patient Stated Goals to be able to walk better, to get this swelling down. I hate it.    Pain Onset Other (comment)   leg pain and swelling first noticed during long care trip ~ 7 hours                         OT Treatments/Exercises (OP) - 08/08/21 1517       ADLs   ADL Education Given Yes      Manual Therapy   Manual Therapy Edema management    Manual Lymphatic Drainage (MLD) LLE/LLQ MLD utilizing modified short neck sequence ( no lateral neck strokes) deep abdominal pathways (diaphragmatic breathing), functional inguinal LN, and proximal to distal strokes to thigh, bottleneck at knee, leg and foot. # retrograde full sequences from too to clavical to  end. Good tolerance    Compression Bandaging LLE miultilayer gradient compresion wraps using single layer of  circumferentially aplied 0.4 cm thick Rosidal foam over cotton stockinet . Applied one 8 cm and 1 10 cm short stretch wrap over foam  using gradient techniques and Law of [place]                    OT Education - 08/08/21 1621     Education Details Continued Pt/ CG edu for lymphedema self care  and home program throughout session. Topics include multilayer, gradient compression wrapping, simple self-MLD, therapeutic lymphatic pumping exercises, skin/nail care, risk reduction factors and LE precautions, compression garments/recommendations and wear and care schedule and compression garment donning / doffing using assistive devices. All questions answered to the  Pt's satisfaction, and Pt demonstrates understanding by report.    Person(s) Educated Patient;Spouse    Methods Explanation;Demonstration;Handout;Verbal cues    Comprehension Verbalized understanding;Returned demonstration;Need further instruction;Verbal cues required                 OT Long Term Goals - 06/13/21 0900       OT LONG TERM GOAL #1   Title Given this patient's risk-adjustment variables, her Intake Functional Status score of 29/100 on the FOTO tool, patient will experience at least an increase in function of 3 points for a score of 32/100.    Baseline 97/100    Time 12    Period Weeks    Status New    Target Date 09/11/21      OT LONG TERM GOAL #2   Title Pt will be able to verbalize at least 4 lymphedema precautions and prevention strategies with Max caregiver assist to reduce infection risk limit progression of lymphedema.    Baseline dependent    Time 6    Period Days    Status New    Target Date --   6th OT Rx visit     OT LONG TERM GOAL #3   Title Pt will be able to apply multi-layer, short stretch compression wraps to one leg at a time using correct gradient techniques with max assistance in an effort  to return the affected  limb(s)  to premorbid size and shape, to limit pain and infection risk, and to improve functional mobility and ambulation  for ADLs.    Baseline dependent    Time 6    Period Days    Status New    Target Date --   6th OT Rx visit     OT LONG TERM GOAL #4   Title Pt will achieve at least a 10% limb volume reduction bilaterally to limit lymphedema progression, to improve tissue health and limit infection risk, and to increase tissue flexibility essential for optimal AROM and safe functional ambulation and mobility.    Baseline dependent    Time 12    Period Weeks    Status New    Target Date 09/11/21      OT LONG TERM GOAL #5   Title With Max caregiver assistance Pt will achieve and sustain a least 85% compliance with all LE  self-care home program components throughout Intensive Phase CDT, including frequent elevation when seated, daily skin inspection and care, lymphatic pumping ther ex, 23/7 compression wraps and simple self-MLD, to sustain clinical gains made in CDT and to limit lymphedema progression and further functional decline.    Baseline dependent    Time 12    Period Weeks  Status New    Target Date 09/11/21      Long Term Additional Goals   Additional Long Term Goals Yes      OT LONG TERM GOAL #6   Title Using assistive devices (modified independence)  and Max CG assistance Pt will be able to don and doff appropriate daytime compression garments  to limit lymphatic re-accumulation and LE progression before transitioning to self-management phase of CDT.    Baseline dependent    Time 12    Period Weeks    Status New    Target Date 09/11/21                   Plan - 08/08/21 1618     Clinical Impression Statement Pt tolerated MLD with concurrent skin care to LLE. Added Artiflex to multi layer gradient wraps to reduce excess pressure build up at ankle ( Law of [place]). Pt able to hold onto sink  and pull up to standing to reposition herself in manual wc at end of session. Cont as per POC. Comprewssion garments are ordered and awaiting delivery for fitting.    OT Occupational Profile and History Comprehensive Assessment- Review of records and extensive additional review of physical, cognitive, psychosocial history related to current functional performance    Occupational performance deficits (Please refer to evaluation for details): ADL's;IADL's;Work;Leisure;Social Participation;Other   body image   Body Structure / Function / Physical Skills ADL;Edema;Skin integrity;Pain;Decreased knowledge of precautions;Decreased knowledge of use of DME;IADL    Rehab Potential Fair    Clinical Decision Making Multiple treatment options, significant modification of task necessary    Comorbidities Affecting  Occupational Performance: Presence of comorbidities impacting occupational performance    Comorbidities impacting occupational performance description: See SUBJECTIVE for relevant Dx    Modification or Assistance to Complete Evaluation  Max significant modification of tasks or assist is necessary to complete    OT Frequency 2x / week    OT Duration 12 weeks   and PRN for follow along support and caregiver support   OT Treatment/Interventions Self-care/ADL training;Therapeutic exercise;Coping strategies training;Therapeutic activities;Manual lymph drainage;Energy conservation;Manual Therapy;Other (comment);DME and/or AE instruction;Compression bandaging;Patient/family education   skin care to limit infection risk and increase skin flexibility   Plan Comple Decongestive Therapy: Manual Lymphatic Drainage (MLD, skin care, therapeutic exercise, compression therapy using short stretch    wraps and compression garments/ devices to retain clinical gains    Recommended Other Services Consider trial with advanced sequential pneumatic compression device, or "pump". Flexitouch device recommended over all as this stimulates lymphatics proximally to distal duplicating anatomic function. Need MD clearance due to Hx of CVA and MS. Pt may not tolerate 2/2 anxiety, but will explore her interest. Consider alternative to Amlodipine due to limb swelling side effect.    Consulted and Agree with Plan of Care Family member/caregiver;Patient    Family Member Consulted Spouse, Bill             Patient will benefit from skilled therapeutic intervention in order to improve the following deficits and impairments:   Body Structure / Function / Physical Skills: ADL, Edema, Skin integrity, Pain, Decreased knowledge of precautions, Decreased knowledge of use of DME, IADL       Visit Diagnosis: Lymphedema, not elsewhere classified    Problem List Patient Active Problem List   Diagnosis Date Noted   Rash 01/27/2021    History of CVA (cerebrovascular accident) 01/05/2021   PBA (pseudobulbar affect)    MS (multiple sclerosis) ([place])  Anxiety and depression    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   Generalized anxiety disorder 12/11/2020   Acute focal neurological deficit 12/08/2020   Edema 09/28/2020   Bloating 08/25/2020   Environmental allergies 07/17/2019   Hyperglycemia 07/17/2019   Cough 06/06/2019   Memory change 05/29/2019   Abnormal liver function tests 01/09/2019   Hemorrhoid 09/27/2018   Nausea 08/11/2018   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   Depression with anxiety 02/04/2018   Cognitive and behavioral changes 02/04/2018   Constipation 11/17/2017   Hx of adenomatous colonic polyps 06/06/2016   Lower extremity edema 01/13/2016   Bilateral ovarian cysts 10/16/2015   Health care maintenance 10/16/2015   Colon polyp 01/25/2015   Bone/cartilage disorder 01/25/2015   Headache 11/18/2014   Depression, recurrent ([place]) 11/18/2014   Greater tuberosity of humerus fracture 11/18/2014   Multiple sclerosis ([place]) 11/18/2014   Unsteady gait 11/18/2014   Dizziness 11/18/2014   Inconclusive mammogram 03/15/2013   S/P lumbar spinal fusion 01/04/2013   Surgery, other elective 12/03/2012   DDD (degenerative disc disease), lumbosacral 10/15/2012   Back ache 09/06/2012   Hypertension 09/06/2012   Back pain 09/06/2012   Hypercholesterolemia 01/28/2012   Climacteric 01/28/2012   Routine general medical examination at a health care facility 01/28/2012   Avitaminosis D 01/28/2012    Andrey Spearman, MS, OTR/L, [facility] 08/08/21 4:22 PM   [place] MAIN [facility] SERVICES [place], [place], [address] Phone: [phone]   Fax:  [phone]  Name: Carly [name] MRN: [id number] Date of Birth: 1951-06-08

## 2021-08-12 ENCOUNTER — Telehealth: Payer: Self-pay | Admitting: Internal Medicine

## 2021-08-12 ENCOUNTER — Ambulatory Visit: Payer: Medicare Other

## 2021-08-12 NOTE — Telephone Encounter (Signed)
Patient is having a build up of yeast above her vagina in between the folds of skin. I have scheduled the patient for 08/15/21 @ 10:30.

## 2021-08-13 DIAGNOSIS — G35 Multiple sclerosis: Secondary | ICD-10-CM | POA: Diagnosis not present

## 2021-08-13 DIAGNOSIS — G4733 Obstructive sleep apnea (adult) (pediatric): Secondary | ICD-10-CM | POA: Diagnosis not present

## 2021-08-13 NOTE — Telephone Encounter (Signed)
Noted  

## 2021-08-14 ENCOUNTER — Other Ambulatory Visit: Payer: Self-pay

## 2021-08-14 ENCOUNTER — Ambulatory Visit: Payer: Medicare Other | Attending: Internal Medicine | Admitting: Physical Therapy

## 2021-08-14 ENCOUNTER — Ambulatory Visit: Payer: Medicare Other | Admitting: Occupational Therapy

## 2021-08-14 DIAGNOSIS — R278 Other lack of coordination: Secondary | ICD-10-CM | POA: Insufficient documentation

## 2021-08-14 DIAGNOSIS — R262 Difficulty in walking, not elsewhere classified: Secondary | ICD-10-CM | POA: Diagnosis not present

## 2021-08-14 DIAGNOSIS — R269 Unspecified abnormalities of gait and mobility: Secondary | ICD-10-CM | POA: Insufficient documentation

## 2021-08-14 DIAGNOSIS — M6281 Muscle weakness (generalized): Secondary | ICD-10-CM | POA: Diagnosis not present

## 2021-08-14 DIAGNOSIS — R2681 Unsteadiness on feet: Secondary | ICD-10-CM | POA: Diagnosis not present

## 2021-08-14 DIAGNOSIS — R2689 Other abnormalities of gait and mobility: Secondary | ICD-10-CM | POA: Insufficient documentation

## 2021-08-14 DIAGNOSIS — I890 Lymphedema, not elsewhere classified: Secondary | ICD-10-CM | POA: Insufficient documentation

## 2021-08-14 NOTE — Therapy (Signed)
[place] MAIN [facility] SERVICES [place], [place], [address] Phone: [phone]   Fax:  [phone]  Physical Therapy Treatment  Patient Details  Name: Carly [name] MRN: [id number] Date of Birth: 01-30-1951 Referring Provider (PT): Einar Pheasant MD   Encounter Date: 08/14/2021   PT End of Session - 08/14/21 0939     Visit Number 2    Number of Visits 24    Date for PT Re-Evaluation 10/31/21    Authorization Time Period 08/08/21-10/31/20    Progress Note Due on Visit 10    PT Start Time 0935    PT Stop Time 1015    PT Time Calculation (min) 40 min    Equipment Utilized During Treatment Gait belt    Activity Tolerance Patient tolerated treatment well;Treatment limited secondary to agitation    Behavior During Therapy WFL for tasks assessed/performed             Past Medical History:  Diagnosis Date   Allergy    Anxiety    Aspiration pneumonia ([place])    Depression    Frequent headaches    H/O   GERD (gastroesophageal reflux disease)    History of chicken pox    History of colon polyps    Hx of migraines    Multiple sclerosis ([place])    PONV (postoperative nausea and vomiting)     Past Surgical History:  Procedure Laterality Date   BACK SURGERY     CHOLECYSTECTOMY     COLONOSCOPY WITH PROPOFOL N/A 05/18/2017   Procedure: COLONOSCOPY WITH PROPOFOL;  Surgeon: [name], MD;  Location: [facility] ENDOSCOPY;  Service: Endoscopy;  Laterality: N/A;   FOOT SURGERY  2015   GALLBLADDER SURGERY  2008   HARDWARE REMOVAL Left 02/14/2016   Procedure: LEFT FOOT REMOVAL DEEP IMPLANT;  Surgeon: [name], MD;  Location: [place];  Service: Orthopedics;  Laterality: Left;   HERNIA REPAIR     Inguinal Hernia Repair   SPINE SURGERY  2014    There were no vitals filed for this visit.   Subjective Assessment - 08/14/21 0938     Subjective I had some steroids so I'm feeling better. But I didn't get to  sleep last night so I'm a little tired.    Patient is accompained by: Family member    Pertinent History Pt reports she is in PT on order to allow for improvement with walking. Pt reports she has not been able to get [facility] PT because she is being seen by outpatient OT services. Pt reports she was walking  and her caregiver who helped her walk had a heart attack. Pt reports the last time she was walking regularly was about 2 years ago. She reports she gets up to [employer] with some assistance from her husband. Pt has walker, rollator and WC at home. Pt reports she used them often until her caregiver had the heart attack. Pt switched to new MS medication a couple years ago and attributes some of her dificits to the medicaiton change. Pt has significant difficulty with any activities involving standing. Pt lives in home wiht ramp to enter. House is one story with an upstairs but she lives primarilly on the first floor. Pt has shower which she can access. 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 She has weighted bracelets she has been using for arm strength. Pt reports she has not been to outpatient PT in the past but  she has had HH in the past. Pt reports swimming in past for exercise. She also reports she is moving to [place] [facility] in January.    Limitations Standing;Walking    Patient Stated Goals Standing, walking, transfers    Currently in Pain? No/denies                TREATMENT: Seated hip flexion march with cues for diaphragmatic breathing, exhale on lift to facilitate better AROM; Required mod VCs for sequencing and positioning;   LAQ with ankle DF for better ROM/strengthening, 3 sec hold x10 reps each LE;  Heel raise x15 reps with therapist blocking left foot for better positioning  Seated hip abduction/ER with therapist resistance 5 sec hold x10 reps; Patient and caregiver educated in proper positioning and exercise technique; Patient unable to tolerate resistance bands  due to increased edema in LLE; Required cues to increase LLE muscle activation for better strengthening;     Patient provided with written HEP for better adherence and understanding. She denies any increase in pain with exercise. Patient and caregiver verbalize understanding;                     PT Education - 08/14/21 0939     Education provided Yes    Education Details Strengthening, HEP    Person(s) Educated Patient    Methods Explanation;Verbal cues    Comprehension Verbalized understanding;Returned demonstration;Verbal cues required;Need further instruction              PT Short Term Goals - 08/08/21 1724       PT SHORT TERM GOAL #1   Title Patient will be independent in home exercise program to improve strength/mobility for better functional independence with ADLs.    Baseline Pt does not have HEP    Time 4    Period Weeks    Status New    Target Date 09/05/21      PT SHORT TERM GOAL #2   Title Patient will increase FOTO score to equal to or greater than 4    to demonstrate statistically significant improvement in mobility and quality of life.    Baseline 28    Time 4    Period Weeks    Target Date 09/05/21               PT Long Term Goals - 08/08/21 1720       PT LONG TERM GOAL #1   Title Patient will be independent with advanced and progressive home program for strength and endurance in order to transition to self management    Baseline Pt does not have current formal HEP    Time 4    Period Weeks    Status New    Target Date 10/03/21      PT LONG TERM GOAL #2   Title pt's FOTO score will improve by 4 points or more indicating improved confidence with daily tasks at home    Baseline 28 at intake    Time 6    Period Weeks    Status New      PT LONG TERM GOAL #3   Title Patient will deny any falls over past 6 weeks to demonstrate improved safety awareness at home and work.    Baseline Pt reports 3 falls in previous 6 months     Time 6    Period Weeks    Status New      PT LONG TERM GOAL #4  Title Patient will perform sit to and from stand from her wheelchair with min assist and no upper extremity support in order to indicate improvement in function.    Baseline Patient requires mod assist from author and bilateral lower extremity support to transition to and from sitting    Time 12    Period Weeks    Status New    Target Date 10/31/21                   Plan - 08/14/21 1016     Clinical Impression Statement Patient motivated and participated well within session. She was instructed in HEP for LE strengthening. PT instructed patient in AROM with cues for increase hold time and slow down LE movement for better motor control and strengthening. PT did not provide resistance with tband to reduce discomfort as patient does have edema in LLE (weaker leg). Patient and caregiver verbalized understanding of HEP. She would benefit from additional skilled PT Intervention to improve strength  and mobility; Plan to assess tolerance of HEP next session and will advance as tolerated.    Personal Factors and Comorbidities Age;Comorbidity 1;Comorbidity 2;Comorbidity 3+;Time since onset of injury/illness/exacerbation    Comorbidities Depression, anxiety, HTN, MS, DDD, GERD    Examination-Activity Limitations Bed Mobility;Carry;Continence;Dressing;Hygiene/Grooming;Lift;Locomotion Level;Squat;Stairs;Stand;Toileting;Transfers    Examination-Participation Restrictions Cleaning;Community Activity;Laundry;Meal Prep;Shop    Stability/Clinical Decision Making Evolving/Moderate complexity    Rehab Potential Fair    Clinical Impairments Affecting Rehab Potential (+) family support, motivated (-) MS, decreased confidence, co morbidities    PT Frequency 2x / week    PT Duration 12 weeks    PT Treatment/Interventions Patient/family education;Gait training;Neuromuscular re-education;Therapeutic exercise;Manual techniques;ADLs/Self Care  Home Management;Stair training;DME Instruction;Functional mobility training;Therapeutic activities;Balance training;Wheelchair mobility training;Energy conservation;Joint Manipulations    PT Next Visit Plan exercises for strength, endurance, private treatment room, develop HEP    PT Home Exercise Plan see patient instructions;    Consulted and Agree with Plan of Care Patient             Patient will benefit from skilled therapeutic intervention in order to improve the following deficits and impairments:  Decreased strength, Decreased balance, Decreased activity tolerance, Decreased endurance, Difficulty walking, Abnormal gait, Improper body mechanics, Decreased mobility, Hypomobility  Visit Diagnosis: Abnormality of gait and mobility  Difficulty in walking, not elsewhere classified  Muscle weakness (generalized)  Unsteadiness on feet     Problem List Patient Active Problem List   Diagnosis Date Noted   Rash 01/27/2021   History of CVA (cerebrovascular accident) 01/05/2021   PBA (pseudobulbar affect)    MS (multiple sclerosis) ([place])    Anxiety and depression    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   Generalized anxiety disorder 12/11/2020   Acute focal neurological deficit 12/08/2020   Edema 09/28/2020   Bloating 08/25/2020   Environmental allergies 07/17/2019   Hyperglycemia 07/17/2019   Cough 06/06/2019   Memory change 05/29/2019   Abnormal liver function tests 01/09/2019   Hemorrhoid 09/27/2018   Nausea 08/11/2018   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   Depression with anxiety 02/04/2018   Cognitive and behavioral changes 02/04/2018   Constipation 11/17/2017   Hx of adenomatous colonic polyps 06/06/2016   Lower extremity edema 01/13/2016   Bilateral ovarian cysts 10/16/2015   Health care maintenance 10/16/2015   Colon polyp 01/25/2015   Bone/cartilage disorder 01/25/2015   Headache 11/18/2014   Depression, recurrent ([place]) 11/18/2014   Greater  tuberosity of humerus fracture 11/18/2014   Multiple sclerosis ([place]) 11/18/2014  Unsteady gait 11/18/2014   Dizziness 11/18/2014   Inconclusive mammogram 03/15/2013   S/P lumbar spinal fusion 01/04/2013   Surgery, other elective 12/03/2012   DDD (degenerative disc disease), lumbosacral 10/15/2012   Back ache 09/06/2012   Hypertension 09/06/2012   Back pain 09/06/2012   Hypercholesterolemia 01/28/2012   Climacteric 01/28/2012   Routine general medical examination at a health care facility 01/28/2012   Avitaminosis D 01/28/2012    Malayla Granberry, PT, DPT 08/14/2021, 10:18 AM  [place], [place], [address] Phone: [phone]   Fax:  [phone]  Name: Carly [name] MRN: [id number] Date of Birth: [birth year]

## 2021-08-14 NOTE — Patient Instructions (Signed)
Access Code: [email or web address] URL: [email or web address] Date: 08/14/2021 Prepared by: Blanche East  Exercises Seated March - 1 x daily - 7 x weekly - 1 sets - 15 reps Seated Long Arc Quad - 1 x daily - 7 x weekly - 1 sets - 10 reps - 5 sec hold Seated Isometric Hip Abduction with Manual Resistance - 1 x daily - 7 x weekly - 1 sets - 10 reps - 5 sec hold Seated Heel Raise - 1 x daily - 7 x weekly - 4-5 sets - 15 reps

## 2021-08-14 NOTE — Therapy (Signed)
[place] MAIN [facility] SERVICES [address] [place], [place], [address] Phone: [phone]   Fax:  [phone]  Occupational Therapy Treatment  Patient Details  Name: Carly [name] MRN: [id number] Date of Birth: Jan 22, 1951 Referring Provider (OT): Einar Pheasant, MD   Encounter Date: 08/14/2021   OT End of Session - 08/14/21 1258     Visit Number 8    Number of Visits 36    Date for OT Re-Evaluation 09/11/21    Authorization Type Initial eval 06/12/21    OT Start Time 0100    OT Stop Time 0210    OT Time Calculation (min) 70 min    Activity Tolerance Other (comment);Patient tolerated treatment well;No increased pain   limited by anxiety and perseveration   Behavior During Therapy [facility] for tasks assessed/performed             Past Medical History:  Diagnosis Date   Allergy    Anxiety    Aspiration pneumonia (HCC)    Depression    Frequent headaches    H/O   GERD (gastroesophageal reflux disease)    History of chicken pox    History of colon polyps    Hx of migraines    Multiple sclerosis (HCC)    PONV (postoperative nausea and vomiting)     Past Surgical History:  Procedure Laterality Date   BACK SURGERY     CHOLECYSTECTOMY     COLONOSCOPY WITH PROPOFOL N/A 05/18/2017   Procedure: COLONOSCOPY WITH PROPOFOL;  Surgeon: [name], MD;  Location: [facility] ENDOSCOPY;  Service: Endoscopy;  Laterality: N/A;   FOOT SURGERY  2015   GALLBLADDER SURGERY  2008   HARDWARE REMOVAL Left 02/14/2016   Procedure: LEFT FOOT REMOVAL DEEP IMPLANT;  Surgeon: [name], MD;  Location: [place];  Service: Orthopedics;  Laterality: Left;   HERNIA REPAIR     Inguinal Hernia Repair   SPINE SURGERY  2014    There were no vitals filed for this visit.   Subjective Assessment - 08/14/21 1259     Subjective  Carly [name] presents to OT for Rx visit  8/36 to addressBLE lymphedema (LE). Pt is accompanied by her  husband, [name]. and paid attendant. Pt attended PT treatment this morning and tells me she enjoyed it.    Patient is accompanied by: Family member    Pertinent History Relevant to limb swelling: Depression /anxiety, MS, HTN. 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 weakness,    Special Tests Intake FOTO: 29/100 (functional outcome score)    Patient Stated Goals to be able to walk better, to get this swelling down. I hate it.    Pain Onset Other (comment)   leg pain and swelling first noticed during long care trip ~ 7 hours                                 OT Education - 08/14/21 1300     Education Details Continued Pt/ CG edu for lymphedema self care  and home program throughout session. Topics include multilayer, gradient compression wrapping, simple self-MLD, therapeutic lymphatic pumping exercises, skin/nail care, risk reduction factors and LE precautions, compression garments/recommendations and wear and care schedule and compression garment donning / doffing using assistive  devices. All questions answered to the Pt's satisfaction, and Pt demonstrates understanding by report.    Person(s) Educated Patient;Spouse    Methods Explanation;Demonstration;Handout;Verbal cues    Comprehension Verbalized understanding;Returned demonstration;Need further instruction;Verbal cues required                 OT Long Term Goals - 06/13/21 0900       OT LONG TERM GOAL #1   Title Given this patient's risk-adjustment variables, her Intake Functional Status score of 29/100 on the FOTO tool, patient will experience at least an increase in function of 3 points for a score of 32/100.    Baseline 97/100    Time 12    Period Weeks    Status New    Target Date 09/11/21      OT LONG TERM GOAL #2   Title  Pt will be able to verbalize at least 4 lymphedema precautions and prevention strategies with Max caregiver assist to reduce infection risk limit progression of lymphedema.    Baseline dependent    Time 6    Period Days    Status New    Target Date --   6th OT Rx visit     OT LONG TERM GOAL #3   Title Pt will be able to apply multi-layer, short stretch compression wraps to one leg at a time using correct gradient techniques with max assistance in an effort  to return the affected  limb(s)  to premorbid size and shape, to limit pain and infection risk, and to improve functional mobility and ambulation  for ADLs.    Baseline dependent    Time 6    Period Days    Status New    Target Date --   6th OT Rx visit     OT LONG TERM GOAL #4   Title Pt will achieve at least a 10% limb volume reduction bilaterally to limit lymphedema progression, to improve tissue health and limit infection risk, and to increase tissue flexibility essential for optimal AROM and safe functional ambulation and mobility.    Baseline dependent    Time 12    Period Weeks    Status New    Target Date 09/11/21      OT LONG TERM GOAL #5   Title With Max caregiver assistance Pt will achieve and sustain a least 85% compliance with all LE self-care home program components throughout Intensive Phase CDT, including frequent elevation when seated, daily skin inspection and care, lymphatic pumping ther ex, 23/7 compression wraps and simple self-MLD, to sustain clinical gains made in CDT and to limit lymphedema progression and further functional decline.    Baseline dependent    Time 12    Period Weeks    Status New    Target Date 09/11/21      Long Term Additional Goals   Additional Long Term Goals Yes      OT LONG TERM GOAL #6   Title Using assistive devices (modified independence)  and Max CG assistance Pt will be able to don and doff appropriate daytime compression garments  to limit lymphatic re-accumulation and LE  progression before transitioning to self-management phase of CDT.    Baseline dependent    Time 12    Period Weeks    Status New    Target Date 09/11/21                   Plan - 08/14/21 1524     Clinical  Impression Statement Retaught Pt and paid caregiver again today how to correctly apply compression wraps. Spouse applied them incorrectly last night despite several times instructing techniques. Bruising persists today as does excess foot swelling due to incorrect compression wrapping. Pt tolerated MLD with concurrent skin care to LLE. Added Artiflex to multi layer gradient wraps to reduce excess pressure build up at ankle. Pt c/o not being able to sign her name now that [place] has progressed. She had opportunities to practice writing with several assistive devices during MLD, including weighted pen and pencil, foam and pencil grip and pen again. She was able to print legibly with the pen again, and able to write  her signature in cursive hand with the weighted pencil. Resources provided so she can shop for these helpful  devices on line.  Comprssion garments should be available for fitting next visit. Shift to LLE CDT one RLE garments are fitted. Cont as per POC.    OT Occupational Profile and History Comprehensive Assessment- Review of records and extensive additional review of physical, cognitive, psychosocial history related to current functional performance    Occupational performance deficits (Please refer to evaluation for details): ADL's;IADL's;Work;Leisure;Social Participation;Other   body image   Body Structure / Function / Physical Skills ADL;Edema;Skin integrity;Pain;Decreased knowledge of precautions;Decreased knowledge of use of DME;IADL    Rehab Potential Fair    Clinical Decision Making Multiple treatment options, significant modification of task necessary    Comorbidities Affecting Occupational Performance: Presence of comorbidities impacting occupational performance     Comorbidities impacting occupational performance description: See SUBJECTIVE for relevant Dx    Modification or Assistance to Complete Evaluation  Max significant modification of tasks or assist is necessary to complete    OT Frequency 2x / week    OT Duration 12 weeks   and PRN for follow along support and caregiver support   OT Treatment/Interventions Self-care/ADL training;Therapeutic exercise;Coping strategies training;Therapeutic activities;Manual lymph drainage;Energy conservation;Manual Therapy;Other (comment);DME and/or AE instruction;Compression bandaging;Patient/family education   skin care to limit infection risk and increase skin flexibility   Plan Comple Decongestive Therapy: Manual Lymphatic Drainage (MLD, skin care, therapeutic exercise, compression therapy using short stretch    wraps and compression garments/ devices to retain clinical gains    Recommended Other Services Consider trial with advanced sequential pneumatic compression device, or "pump". Flexitouch device recommended over all as this stimulates lymphatics proximally to distal duplicating anatomic function. Need MD clearance due to Hx of CVA and MS. Pt may not tolerate 2/2 anxiety, but will explore her interest. Consider alternative to Amlodipine due to limb swelling side effect.    Consulted and Agree with Plan of Care Family member/caregiver;Patient    Family Member Consulted Spouse, Bill             Patient will benefit from skilled therapeutic intervention in order to improve the following deficits and impairments:   Body Structure / Function / Physical Skills: ADL, Edema, Skin integrity, Pain, Decreased knowledge of precautions, Decreased knowledge of use of DME, IADL       Visit Diagnosis: Lymphedema, not elsewhere classified    Problem List Patient Active Problem List   Diagnosis Date Noted   Rash 01/27/2021   History of CVA (cerebrovascular accident) 01/05/2021   PBA (pseudobulbar affect)    MS  (multiple sclerosis) ([place])    Anxiety and depression    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   Generalized anxiety disorder 12/11/2020   Acute focal neurological deficit 12/08/2020   Edema 09/28/2020  Bloating 08/25/2020   Environmental allergies 07/17/2019   Hyperglycemia 07/17/2019   Cough 06/06/2019   Memory change 05/29/2019   Abnormal liver function tests 01/09/2019   Hemorrhoid 09/27/2018   Nausea 08/11/2018   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   Depression with anxiety 02/04/2018   Cognitive and behavioral changes 02/04/2018   Constipation 11/17/2017   Hx of adenomatous colonic polyps 06/06/2016   Lower extremity edema 01/13/2016   Bilateral ovarian cysts 10/16/2015   Health care maintenance 10/16/2015   Colon polyp 01/25/2015   Bone/cartilage disorder 01/25/2015   Headache 11/18/2014   Depression, recurrent ([place]) 11/18/2014   Greater tuberosity of humerus fracture 11/18/2014   Multiple sclerosis ([place]) 11/18/2014   Unsteady gait 11/18/2014   Dizziness 11/18/2014   Inconclusive mammogram 03/15/2013   S/P lumbar spinal fusion 01/04/2013   Surgery, other elective 12/03/2012   DDD (degenerative disc disease), lumbosacral 10/15/2012   Back ache 09/06/2012   Hypertension 09/06/2012   Back pain 09/06/2012   Hypercholesterolemia 01/28/2012   Climacteric 01/28/2012   Routine general medical examination at a health care facility 01/28/2012   Avitaminosis D 01/28/2012   Andrey Spearman, MS, OTR/L, [facility] 08/14/21 3:33 PM    [place] MAIN [facility] SERVICES [address] [place], [place], [address] Phone: [phone]   Fax:  [phone]  Name: Carly [name] MRN: [id number] Date of Birth: [birth year]

## 2021-08-14 NOTE — Patient Instructions (Signed)

## 2021-08-15 ENCOUNTER — Other Ambulatory Visit: Payer: Self-pay

## 2021-08-15 ENCOUNTER — Ambulatory Visit: Payer: Medicare Other | Admitting: Occupational Therapy

## 2021-08-15 ENCOUNTER — Ambulatory Visit (INDEPENDENT_AMBULATORY_CARE_PROVIDER_SITE_OTHER): Payer: Medicare Other | Admitting: Internal Medicine

## 2021-08-15 ENCOUNTER — Telehealth: Payer: Self-pay | Admitting: Internal Medicine

## 2021-08-15 ENCOUNTER — Encounter: Payer: Self-pay | Admitting: Internal Medicine

## 2021-08-15 VITALS — BP 126/78 | HR 92 | Temp 98.5°F | Resp 16 | Ht 63.0 in | Wt 187.0 lb

## 2021-08-15 DIAGNOSIS — Z8673 Personal history of transient ischemic attack (TIA), and cerebral infarction without residual deficits: Secondary | ICD-10-CM | POA: Diagnosis not present

## 2021-08-15 DIAGNOSIS — I639 Cerebral infarction, unspecified: Secondary | ICD-10-CM | POA: Diagnosis not present

## 2021-08-15 DIAGNOSIS — K635 Polyp of colon: Secondary | ICD-10-CM

## 2021-08-15 DIAGNOSIS — R739 Hyperglycemia, unspecified: Secondary | ICD-10-CM | POA: Diagnosis not present

## 2021-08-15 DIAGNOSIS — G35 Multiple sclerosis: Secondary | ICD-10-CM | POA: Diagnosis not present

## 2021-08-15 DIAGNOSIS — I10 Essential (primary) hypertension: Secondary | ICD-10-CM

## 2021-08-15 DIAGNOSIS — F339 Major depressive disorder, recurrent, unspecified: Secondary | ICD-10-CM

## 2021-08-15 DIAGNOSIS — K5900 Constipation, unspecified: Secondary | ICD-10-CM | POA: Diagnosis not present

## 2021-08-15 DIAGNOSIS — E7800 Pure hypercholesterolemia, unspecified: Secondary | ICD-10-CM | POA: Diagnosis not present

## 2021-08-15 DIAGNOSIS — R600 Localized edema: Secondary | ICD-10-CM | POA: Diagnosis not present

## 2021-08-15 DIAGNOSIS — Z23 Encounter for immunization: Secondary | ICD-10-CM | POA: Diagnosis not present

## 2021-08-15 LAB — CBC WITH DIFFERENTIAL/PLATELET
Basophils Absolute: 0.1 10*3/uL (ref 0.0–0.1)
Basophils Relative: 0.5 % (ref 0.0–3.0)
Eosinophils Absolute: 0.2 10*3/uL (ref 0.0–0.7)
Eosinophils Relative: 1.5 % (ref 0.0–5.0)
HCT: 41.5 % (ref 36.0–46.0)
Hemoglobin: 13.8 g/dL (ref 12.0–15.0)
Lymphocytes Relative: 27.5 % (ref 12.0–46.0)
Lymphs Abs: 2.9 10*3/uL (ref 0.7–4.0)
MCHC: 33.2 g/dL (ref 30.0–36.0)
MCV: 81.1 fl (ref 78.0–100.0)
Monocytes Absolute: 0.7 10*3/uL (ref 0.1–1.0)
Monocytes Relative: 6.6 % (ref 3.0–12.0)
Neutro Abs: 6.7 10*3/uL (ref 1.4–7.7)
Neutrophils Relative %: 63.9 % (ref 43.0–77.0)
Platelets: 300 10*3/uL (ref 150.0–400.0)
RBC: 5.11 Mil/uL (ref 3.87–5.11)
RDW: 15.1 % (ref 11.5–15.5)
WBC: 10.6 10*3/uL — ABNORMAL HIGH (ref 4.0–10.5)

## 2021-08-15 LAB — BASIC METABOLIC PANEL
BUN: 14 mg/dL (ref 6–23)
CO2: 29 mEq/L (ref 19–32)
Calcium: 10.1 mg/dL (ref 8.4–10.5)
Chloride: 103 mEq/L (ref 96–112)
Creatinine, Ser: 0.67 mg/dL (ref 0.40–1.20)
GFR: 88.14 mL/min (ref >60.00)
Glucose, Bld: 98 mg/dL (ref 70–99)
Potassium: 3.7 mEq/L (ref 3.5–5.1)
Sodium: 143 mEq/L (ref 135–145)

## 2021-08-15 LAB — LIPID PANEL
Cholesterol: 190 mg/dL (ref 0–200)
HDL: 94.8 mg/dL (ref >39.00)
LDL Cholesterol: 70 mg/dL (ref 0–99)
NonHDL: 95.36
Total CHOL/HDL Ratio: 2
Triglycerides: 126 mg/dL (ref 0.0–149.0)
VLDL: 25.2 mg/dL (ref 0.0–40.0)

## 2021-08-15 LAB — HEPATIC FUNCTION PANEL
ALT: 18 U/L (ref 0–35)
AST: 16 U/L (ref 0–37)
Albumin: 4.8 g/dL (ref 3.5–5.2)
Alkaline Phosphatase: 91 U/L (ref 39–117)
Bilirubin, Direct: 0 mg/dL (ref 0.0–0.3)
Total Bilirubin: 0.3 mg/dL (ref 0.2–1.2)
Total Protein: 7.1 g/dL (ref 6.0–8.3)

## 2021-08-15 LAB — HEMOGLOBIN A1C: Hgb A1c MFr Bld: 6.3 % (ref 4.6–6.5)

## 2021-08-15 NOTE — Telephone Encounter (Signed)
Pt was seen today 08/15/21 at 10:30AM. Pt states her and Dr. Nicki Reaper discussed her using Nystatin cream and Nystatin powder. Pt stated the order for the Nystatin powder is supposed to be sent into her pharmacy however they have not yet received the order. prescription can be sent to [facility] in [place].   Callback number is [phone]

## 2021-08-15 NOTE — Progress Notes (Signed)
Patient ID: Carly [name], female   DOB: 15-Dec-1950, 70 y.o.   MRN: [id number]   Subjective:    Patient ID: Carly [name], female    DOB: 1951/08/16, 70 y.o.   MRN: [id number]  This visit occurred during the SARS-CoV-2 public health emergency.  Safety protocols were in place, including screening questions prior to the visit, additional usage of staff PPE, and extensive cleaning of exam room while observing appropriate contact time as indicated for disinfecting solutions.   Patient here for a scheduled follow up.   HPI Here for a scheduled follow up.  Here to follow up regarding her blood pressure and increased stress.  History of cva.  Increased stress related to this.  She is accompanied by her husband.  History obtained from both of them.  Going to therapy for lymphedema.  Legs doing better.  Blood pressure on outside check - 120s/60-70s. Increased rash - lower abdomen. No chest pain.  Breathing stable.  Some minimal bowel  change.     Past Medical History:  Diagnosis Date   Allergy    Anxiety    Aspiration pneumonia (HCC)    Depression    Frequent headaches    H/O   GERD (gastroesophageal reflux disease)    History of chicken pox    History of colon polyps    Hx of migraines    Multiple sclerosis (HCC)    PONV (postoperative nausea and vomiting)    Past Surgical History:  Procedure Laterality Date   BACK SURGERY     CHOLECYSTECTOMY     COLONOSCOPY WITH PROPOFOL N/A 05/18/2017   Procedure: COLONOSCOPY WITH PROPOFOL;  Surgeon: [name], MD;  Location: [facility] ENDOSCOPY;  Service: Endoscopy;  Laterality: N/A;   FOOT SURGERY  2015   GALLBLADDER SURGERY  2008   HARDWARE REMOVAL Left 02/14/2016   Procedure: LEFT FOOT REMOVAL DEEP IMPLANT;  Surgeon: [name], MD;  Location: [place];  Service: Orthopedics;  Laterality: Left;   HERNIA REPAIR     Inguinal Hernia Repair   SPINE SURGERY  2014   Family History  Problem Relation Age of Onset    Arthritis Mother    Hypertension Mother    Macular degeneration Mother    Hypertension Father    Hyperlipidemia Father    Heart disease Maternal Grandfather    Diabetes Maternal Grandfather    Kidney disease Paternal Grandmother    Social History   Socioeconomic History   Marital status: Married    Spouse name: Not on file   Number of children: Not on file   Years of education: Not on file   Highest education level: Not on file  Occupational History   Not on file  Tobacco Use   Smoking status: Never   Smokeless tobacco: Never  Vaping Use   Vaping Use: Never used  Substance and Sexual Activity   Alcohol use: No    Alcohol/week: 0.0 standard drinks   Drug use: No   Sexual activity: Not Currently  Other Topics Concern   Not on file  Social History Narrative   Married    Social Determinants of Health   Financial Resource Strain: Low Risk    Difficulty of Paying Living Expenses: Not hard at all  Food Insecurity: No Food Insecurity   Worried About Charity fundraiser in the Last Year: Never true   [place] in the Last Year: Never true  Transportation Needs: No Transportation Needs  Lack of Transportation (Medical): No   Lack of Transportation (Non-Medical): No  Physical Activity: Not on file  Stress: No Stress Concern Present   Feeling of Stress : Not at all  Social Connections: Unknown   Frequency of Communication with Friends and Family: Not on file   Frequency of Social Gatherings with Friends and Family: Not on file   Attends Religious Services: Not on file   Active Member of Clubs or Organizations: Not on file   Attends Archivist Meetings: Not on file   Marital Status: Married     Review of Systems  Constitutional:  Negative for appetite change and unexpected weight change.  HENT:  Negative for congestion and sinus pressure.   Respiratory:  Negative for cough, chest tightness and shortness of breath.   Cardiovascular:  Negative for chest  pain and palpitations.       No increased swelling.   Gastrointestinal:  Negative for abdominal pain, diarrhea, nausea and vomiting.  Genitourinary:  Negative for difficulty urinating and dysuria.  Musculoskeletal:  Negative for joint swelling and myalgias.  Skin:  Positive for rash. Negative for color change.  Neurological:  Negative for dizziness, light-headedness and headaches.  Psychiatric/Behavioral:  Negative for dysphoric mood.        Increased stress.        Objective:     BP 126/78   Pulse 92   Temp 98.5 F (36.9 C) (Oral)   Resp 16   Ht 5\' 3"  (1.6 m)   Wt 187 lb (84.8 kg)   SpO2 98%   BMI 33.13 kg/m  Wt Readings from Last 3 Encounters:  08/15/21 187 lb (84.8 kg)  06/18/21 187 lb (84.8 kg)  06/12/21 187 lb (84.8 kg)    Physical Exam Vitals reviewed.  Constitutional:      General: She is not in acute distress.    Appearance: Normal appearance.  HENT:     Head: Normocephalic and atraumatic.     Right Ear: External ear normal.     Left Ear: External ear normal.  Eyes:     General: No scleral icterus.       Right eye: No discharge.        Left eye: No discharge.     Conjunctiva/sclera: Conjunctivae normal.  Neck:     Thyroid: No thyromegaly.  Cardiovascular:     Rate and Rhythm: Normal rate and regular rhythm.  Pulmonary:     Effort: No respiratory distress.     Breath sounds: Normal breath sounds. No wheezing.  Abdominal:     General: Bowel sounds are normal.     Palpations: Abdomen is soft.     Tenderness: There is no abdominal tenderness.  Musculoskeletal:        General: No tenderness.     Cervical back: Neck supple. No tenderness.     Comments: No increased swelling - lower extremities.   Lymphadenopathy:     Cervical: No cervical adenopathy.  Skin:    Findings: No erythema.     Comments: Rash - lower abdomen - skin folds.   Neurological:     Mental Status: She is alert.  Psychiatric:        Mood and Affect: Mood normal.        Behavior:  Behavior normal.     Outpatient Encounter Medications as of 08/15/2021  Medication Sig   acetaminophen (TYLENOL) 500 MG tablet Take 1 tablet (500 mg total) by mouth every 4 (four) hours as needed.  albuterol (VENTOLIN HFA) 108 (90 Base) MCG/ACT inhaler Inhale 2 puffs into the lungs every 6 (six) hours as needed.   amLODipine (NORVASC) 5 MG tablet Take 5 mg by mouth daily.   Baclofen 5 MG TABS Take 1 tablet by mouth 3 (three) times daily.   Cholecalciferol (D3-1000 PO) Take 2,000 Units by mouth daily.   clopidogrel (PLAVIX) 75 MG tablet TAKE 1 TABLET (75 MG) BY MOUTH EVERY DAY   Coenzyme Q10 (CO Q10) 100 MG CAPS Take 1 capsule by mouth daily.   dalfampridine 10 MG TB12 Take 10 mg by mouth every 12 (twelve) hours.   docusate sodium (COLACE) 100 MG capsule Take 100 mg by mouth 2 (two) times daily.   DULoxetine (CYMBALTA) 60 MG capsule TAKE 1 CAPSULE BY MOUTH DAILY.   fexofenadine (ALLEGRA ALLERGY) 180 MG tablet Take 1 tablet (180 mg total) by mouth daily.   fluticasone (FLONASE) 50 MCG/ACT nasal spray Place 1 spray into both nostrils daily.   furosemide (LASIX) 40 MG tablet Take 1 tablet (40 mg total) by mouth daily.   ketoconazole (NIZORAL) 2 % cream SMARTSIG:1 Application Topical Morning-Night   losartan (COZAAR) 50 MG tablet TAKE 1 TABLET BY MOUTH ONCE DAILY   magnesium oxide (MAG-OX) 400 MG tablet TAKE 1 TABLET BY MOUTH DAILY   METAMUCIL FIBER PO Take by mouth 2 (two) times daily.   mirtazapine (REMERON SOL-TAB) 30 MG disintegrating tablet Take 1 tablet (30 mg total) by mouth at bedtime.   nystatin (MYCOSTATIN/NYSTOP) powder Apply 1 application topically 2 (two) times daily as needed.   nystatin cream (MYCOSTATIN) Apply 1 application topically 2 (two) times daily.   omeprazole (PRILOSEC) 20 MG capsule Take 20 mg by mouth 2 (two) times daily before a meal.   rosuvastatin (CRESTOR) 5 MG tablet Take 1 tablet (5 mg total) by mouth daily.   saccharomyces boulardii (FLORASTOR) 250 MG capsule  Take 1 capsule (250 mg total) by mouth 2 (two) times daily.   SEROQUEL 25 MG tablet Take 12.5mg  in the morning and 50mg  at night   sodium chloride (OCEAN) 0.65 % nasal spray Place 1-2 sprays into the nose as needed.   Vitamin D, Ergocalciferol, (DRISDOL) 1.25 MG (50000 UNIT) CAPS capsule Take 1 capsule (50,000 Units total) by mouth every 7 (seven) days.   [DISCONTINUED] topiramate (TOPAMAX) 25 MG tablet Take 1 tablet (25 mg total) by mouth at bedtime. (Patient not taking: Reported on 08/15/2021)   Facility-Administered Encounter Medications as of 08/15/2021  Medication   0.9 %  sodium chloride infusion   acetaminophen (TYLENOL) tablet 650 mg     Lab Results  Component Value Date   WBC 10.6 (H) 08/15/2021   HGB 13.8 08/15/2021   HCT 41.5 08/15/2021   PLT 300.0 08/15/2021   GLUCOSE 98 08/15/2021   CHOL 190 08/15/2021   TRIG 126.0 08/15/2021   HDL 94.80 08/15/2021   LDLDIRECT 127.0 08/17/2020   LDLCALC 70 08/15/2021   ALT 18 08/15/2021   AST 16 08/15/2021   NA 143 08/15/2021   K 3.7 08/15/2021   CL 103 08/15/2021   CREATININE 0.67 08/15/2021   BUN 14 08/15/2021   CO2 29 08/15/2021   TSH 2.780 07/19/2021   HGBA1C 6.3 08/15/2021    CT MAXILLOFACIAL WO CONTRAST  Result Date: 07/19/2021 CLINICAL DATA:  Shortness of breath, sinus congestion, cough EXAM: CT MAXILLOFACIAL WITHOUT CONTRAST TECHNIQUE: Multidetector CT images of the paranasal sinuses were obtained using the standard protocol without intravenous contrast. COMPARISON:  CT head 12/21/2017.  FINDINGS: Paranasal sinuses: Frontal: Normally aerated. Patent frontal sinus drainage pathways. Ethmoid: Normally aerated. Maxillary: Normally aerated. Minimal mucosal thickening inferiorly in the left maxillary sinus. No osseous erosion or thickening. Sphenoid: Normally aerated. Patent sphenoethmoidal recesses. Right ostiomeatal unit: Patent. Left ostiomeatal unit: Patent. Nasal passages: Patent. Intact nasal septum is midline. Other: Normal  orbits.  The mastoid air cells are well aerated. IMPRESSION: Normally aerated paranasal sinuses. Patent sinus drainage pathways. No evidence of acute or chronic sinusitis. Electronically Signed   By: Merilyn Baba M.D.   On: 07/19/2021 18:05       Assessment & Plan:   Problem List Items Addressed This Visit     Colon polyp    Discussed colonoscopy.  Last colonoscopy 05/2017.  Recommended f/u in 05/2020.  S/p recent cva so will postpone for now.  Will need in future.  Pt in agreement.        Constipation    Bowels have been doing better.  Recently noticed some decrease in bowel movements.  Miralax.  Fiber.  Follow.       Depression, recurrent ([place])    Followed by psychiatry.  On seroquel.  Continues remeron.  Overall appears to be doing better. Follow.       History of CVA (cerebrovascular accident)    Continue risk factor modification.  Appears to be tolerating statin.  Blood pressure better. Continue plavix.         Hypercholesterolemia    Continue crestor.  Low cholesterol diet and exercise.  Follow lipid panel and liver function tests.       Relevant Orders   Lipid panel (Completed)   Hepatic function panel (Completed)   Hyperglycemia    Low-carb diet and exercise.  Follow metabolic panel and H2Z.      Relevant Orders   Hemoglobin A1c (Completed)   Hypertension - Primary    Blood pressure as outlined.  Continue losartan and amlodipine.  Follow pressures.  Follow metabolic panel.       Relevant Orders   Basic metabolic panel (Completed)   Lower extremity edema    PT for treatment lymphedema.  Continue.  Improved.       Multiple sclerosis ([place])    Followed by neurology.  Discussed need for strengthening exercise, etc. Continue exercises/therapy.       Relevant Orders   CBC with Differential/Platelet (Completed)   Other Visit Diagnoses     Need for immunization against influenza       Relevant Orders   Flu Vaccine QUAD High Dose(Fluad) (Completed)         Einar Pheasant, MD

## 2021-08-15 NOTE — Telephone Encounter (Signed)
Okay to call in Nystatin powder?

## 2021-08-16 ENCOUNTER — Ambulatory Visit
Admission: RE | Admit: 2021-08-16 | Discharge: 2021-08-16 | Disposition: A | Discharge status: Home / Self Care | Payer: Medicare Other | Source: Ambulatory Visit | Attending: Internal Medicine | Admitting: Internal Medicine

## 2021-08-16 DIAGNOSIS — Z1231 Encounter for screening mammogram for malignant neoplasm of breast: Secondary | ICD-10-CM | POA: Insufficient documentation

## 2021-08-16 MED ORDER — NYSTATIN 100000 UNIT/GM EX POWD: 1.0000 "application " | Freq: Two times a day (BID) | CUTANEOUS | 0 refills | Status: DC | PRN

## 2021-08-16 NOTE — Telephone Encounter (Signed)
Rx sent in for nystatin powder

## 2021-08-16 NOTE — Telephone Encounter (Signed)
Noted  

## 2021-08-16 NOTE — Telephone Encounter (Signed)
Patient called and was advised medication sent to [facility] pharmacy.

## 2021-08-19 ENCOUNTER — Other Ambulatory Visit: Payer: Self-pay

## 2021-08-19 ENCOUNTER — Encounter
Payer: Medicare Other | Attending: Physical Medicine and Rehabilitation | Admitting: Physical Medicine and Rehabilitation

## 2021-08-19 DIAGNOSIS — G4733 Obstructive sleep apnea (adult) (pediatric): Secondary | ICD-10-CM | POA: Diagnosis not present

## 2021-08-19 DIAGNOSIS — G35 Multiple sclerosis: Secondary | ICD-10-CM | POA: Insufficient documentation

## 2021-08-19 NOTE — Progress Notes (Signed)
Subjective:    Patient ID: Carly [name], female    DOB: 05/07/1951, 70 y.o.   MRN: [id number]  HPI  An audio/video tele-health visit is felt to be the most appropriate encounter for this patient at this time.  This is a follow up tele-visit via phone. The patient is at home. MD is at office.      Carly [name] is a 70 year old woman who was admitted to CIR with white matter periventricular CVA, presents for follow-up regarding anxiety, insomnia, and weight gain.    1) Anxiety:  -her psychiatrist transitioned her from Seroquel back to benzodiazepine due to side effect profile of Seroquel. -tried trialing off of Seroquel and this worsened her anxiety and so we restarted it.  -she and husband [name] were scared of side effects of Seroquel that were discussed- she has not noted any this far.  -she has been sleeping but her legs feel very stiff at night -she continues to take Cymbalta 60mg  -we tried weaning her off the mirtazepine but she experienced weakness and insomnia and wanted to restart- titrated back up to her former dose.  -she discussed with her husband, who is also present on this call today, and she would prefer to restart Seroquel and stop Lorazepam.  -she has not had any counseling and would like to restart this. She has an appointment with [name] in August and would like to start counseling earlier than this if possible.  -she loved speaking with [name] inpatient and would like to follow with him -she had symptoms of dizziness yesterday at the hairdresser and was asked to contact me regarding if this could be from the seroquel. She had had no other similar episodes since starting it.  -she has been sleeping well with the Seroquel at night.  -she would like a list of foods that can help anxiety.  -she asks about the dietary advice I gave her last visit regarding anxiety. -she has been using essential oil.  -the prednisone has caused weight gain.   2)  MS: -has been having a lot of stiffness recently -EMS called twice due to difficulty getting her up off the floor -is receiving home therapy -she would like to see a different neurologist and asked for my recommendation   3) Lymphedema  -has significant bilateral lower extremity  -has greatly improved with lymphedema therapy -lasix 20mg  did not help enough.   4) HTN: SBP has been well controlled, but still with high sytolics of 409W at times  5) Decreased appetite:  -weaning off of [name] has caused decreased appetite and decreased energy.   6) She developed pain in right side, and not sure if this is due to topamax or the antibiotics she is on to treat a respiratory infection.   7) Insomnia:  -sleeping much better since she started back on [name]  8) Chest congestion -she has been having symptoms of congestion for greater than 3 months -Her father saw 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 She received a call to schedule an appointment with [name] office and is thankful for the referral.  -she asks what else she can do to help with this -she has been through multiple rounds of antibiotics without benefit.  -also feeling diffuse joint aches.   9) GERD -has been experiencing after eating  10) Sinus congestion -pleased that her CT results were normal.   11) OSA -she notes  that she was found to have OSA on recent sleep study and she was tearful about this as she feels it is just another condition she has -she was surprised as she feels that she sleeps well.    Pain Inventory Average Pain 0 Pain Right Now 0 My pain is  no pain.  Sometimes spasm in legs at night because of MST  In the last 24 hours, has pain interfered with the following? General activity  n/a Relation with others  n/a Enjoyment of life  n/a  What TIME of day is your pain at its worst? night Sleep (in general)  Good  Pain is worse with: unsure Pain improves with: moving around Relief from Meds:  n/a  Family History  Problem Relation Age of Onset   Arthritis Mother    Hypertension Mother    Macular degeneration Mother    Hypertension Father    Hyperlipidemia Father    Heart disease Maternal Grandfather    Diabetes Maternal Grandfather    Kidney disease Paternal Grandmother    Social History   Socioeconomic History   Marital status: Married    Spouse name: Not on file   Number of children: Not on file   Years of education: Not on file   Highest education level: Not on file  Occupational History   Not on file  Tobacco Use   Smoking status: Never   Smokeless tobacco: Never  Vaping Use   Vaping Use: Never used  Substance and Sexual Activity   Alcohol use: No    Alcohol/week: 0.0 standard drinks   Drug use: No   Sexual activity: Not Currently  Other Topics Concern   Not on file  Social History Narrative   Married    Social Determinants of Health   Financial Resource Strain: Low Risk    Difficulty of Paying Living Expenses: Not hard at all  Food Insecurity: No Food Insecurity   Worried About Charity fundraiser in the Last Year: Never true   Arboriculturist in the Last Year: Never true  Transportation Needs: No Transportation Needs   Lack of Transportation (Medical): No   Lack of Transportation (Non-Medical): No  Physical Activity: Not on file  Stress: No Stress Concern Present   Feeling of Stress : Not at all  Social Connections: Unknown   Frequency of Communication with Friends and Family: Not on file   Frequency of Social Gatherings with Friends and Family: Not on file   Attends Religious Services: Not on file   Active Member of Clubs or Organizations: Not on file   Attends Archivist Meetings: Not on file   Marital Status: Married   Past Surgical History:  Procedure Laterality Date   BACK SURGERY     CHOLECYSTECTOMY     COLONOSCOPY WITH PROPOFOL N/A  05/18/2017   Procedure: COLONOSCOPY WITH PROPOFOL;  Surgeon: [name], MD;  Location: [facility]COPY;  Service: Endoscopy;  Laterality: N/A;   FOOT SURGERY  2015   GALLBLADDER SURGERY  2008   HARDWARE REMOVAL Left 02/14/2016   Procedure: LEFT FOOT REMOVAL DEEP IMPLANT;  Surgeon: [name], MD;  Location: [facility];  Service: Orthopedics;  Laterality: Left;   HERNIA REPAIR     Inguinal Hernia Repair   SPINE SURGERY  2014   Past Surgical History:  Procedure Laterality Date   BACK SURGERY     CHOLECYSTECTOMY     COLONOSCOPY WITH PROPOFOL N/A 05/18/2017   Procedure:  COLONOSCOPY WITH PROPOFOL;  Surgeon: [name], MD;  Location: [facility] ENDOSCOPY;  Service: Endoscopy;  Laterality: N/A;   FOOT SURGERY  2015   GALLBLADDER SURGERY  2008   HARDWARE REMOVAL Left 02/14/2016   Procedure: LEFT FOOT REMOVAL DEEP IMPLANT;  Surgeon: [name], MD;  Location: [facility];  Service: Orthopedics;  Laterality: Left;   HERNIA REPAIR     Inguinal Hernia Repair   SPINE SURGERY  2014   Past Medical History:  Diagnosis Date   Allergy    Anxiety    Aspiration pneumonia (HCC)    Depression    Frequent headaches    H/O   GERD (gastroesophageal reflux disease)    History of chicken pox    History of colon polyps    Hx of migraines    Multiple sclerosis (HCC)    PONV (postoperative nausea and vomiting)    There were no vitals taken for this visit.  Opioid Risk Score:   Fall Risk Score:  `1  Depression screen PHQ 2/9  Depression screen [facility] 2/9 08/15/2021 07/19/2021 06/18/2021 05/31/2021 03/28/2021 11/02/2020 07/12/2019  Decreased Interest 0 1 3 3  0 0 0  Down, Depressed, Hopeless 0 1 3 3  0 0 0  PHQ - 2 Score 0 2 6 6  0 0 0  Altered sleeping - - - - 0 - 0  Tired, decreased energy - - - - 0 - 0  Change in appetite - - - - 0 - 0  Feeling bad or failure about yourself  - - - - 0 - 0  Trouble concentrating - - - - 0 - 0  Moving slowly or fidgety/restless - - - - 0 - 0   Suicidal thoughts - - - - 0 - 0  PHQ-9 Score - - - - 0 - 0  Difficult doing work/chores - - - - Not difficult at all - Not difficult at all  Some recent data might be hidden    Review of Systems  Constitutional: Negative.   HENT: Negative.    Eyes: Negative.   Respiratory:  Negative for cough.        Chest congestion  Cardiovascular: Negative.   Gastrointestinal: Negative.   Endocrine: Negative.   Genitourinary: Negative.   Musculoskeletal:  Positive for back pain and gait problem.  Skin: Negative.   Allergic/Immunologic: Negative.   Hematological:  Bruises/bleeds easily.       Plavix  Psychiatric/Behavioral:  Positive for dysphoric mood.   All other systems reviewed and are negative.     Objective:   Physical Exam Not performed as patient was seen via phone.      Assessment & Plan:  1) Anxiety: -discussed with patient the side effects of both Seroquel and Lorazepam, as well as potential interactions with other medications. -advised her to discuss with Bill the risks and benefits of both medications.  -discussed that we can follow-up tomorrow after their conversation to see what they decide -continue to encourage non-pharmacologic management approaches as well, such as meditation, applying lavender oil, and exercise- all of which she has been trying and which have been helping.  -discussed alternative anxiety medications.  -continue seroquel 12.5mg  daily and 25mg  at night. Can decreased to 25mg  at night and assess positive/negative effects during the day. Can restart 1/2 tab during the day if anxiety is uncontrolled.  -discontinue lorazepam.  -establish care with [name] for neuropsych counseling on 8/2. They really valued the appointment with him.  -discontinue topamax  in case pain in right side is due to medication- discussed can also be due to antibiotics which she is taking for respiratory infection- discontinuing the topamax will help [place] know if it is the cause  of her pain or not. -continue [name] 30mg  HS.  -Discussed exercise and meditation as tools to decrease anxiety. -Recommended Down Dog Yoga app -Discussed spending time outdoors. -Discussed positive re-framing of anxiety.  -Discussed the following foods that have been show to reduce anxiety: 1) [place] nuts, mushrooms, soy beans due to their high selenium content. Upper limit of toxicity of selenium is 446mcg/day so no more than 3-4 [place] nuts per day.  2) Fatty fish such as salmon, mackerel, sardines, trout, and herring- high in omega-3 fatty acids 3) Eggs- increases serotonin and dopamine 4) Pumpkin seeds- high in omega-3 fatty acids 5) dark chocolate- high in flavanols that increase blood flow to brain 6) turmeric- take with black pepper to increase absorption 7) chamomile tea- antioxidant and anti-inflammatory properties 8) yogurt without sugar- supports gut-brain axis 9) green tea- contains L- theanine 10) blueberries- high in vitamin C and antioxidants 11) [place]- high in tryptophan which gets converted to serotonin 12) bell peppers- rich in vitamin C and antioxidants 13) citrus fruits- rich in vitamin C and antioxidants 14) almonds- high in vitamin E and healthy fats 15) chia seeds- high in omega-3 fatty acids  2) MS -continue home therapy to minimize stiffness/maximize mobility -f/u with MS specialist.  -provided with neurology referral  3) HTN: BP has been 130s-150s/80s, recommended citrus foods and nuts, as much mobility as she can tolerate, log Bps daily and bring log to f/u appointment.  -continue current regimen and checking and checking BP.  -advised citrus foods and nuts to help lower BP. Discussed that once BP is consistently 120/80 we can wean her Losartan to 25mg .   4) Bilateral lower extremity edema: - continue lymphedema therapy which is greatly helping.  -discussed response to Lasix. Increase Lasix to 40mg .   5) Insomnia: -recommended tart cherry juice  with dinner, valerian root and chamomile teas in evening, applying lavender drops to forehead at night. -continue [name] 30mg  HS- discussed that we will not try to wean off again since we saw how much this is benefittng her  6) Obesity BMI 33.13 -attempted to wean off [name] but she developed worsening insomnia, anxiety, and decreased appetite that was undesirable to her, so have restarted the medication.  -discussed trial of avoiding gluten for 3 months and how this could potentially help with her weight loss, anxiety, and prevention/slowing of her chronic diseases. -discussed benefits of intermittent fasting- made goal to set a consistent dinner time and avoid snacking after this time. If she is hungry, choose a healthy snack of nuts, fruit, or vegetables.   7) Chest congestion -referred for pulmonary consult with [name], who treated her father for pulmonary fibrosis. She has made an appointment to establish care with him.  -recommended drinking daily warm water with honey, lime and ginger -recommended drinking tea with holy Basil (Tulsi)  8) Sore throat: -continue salt water gargling  9) Sinus congestion: -recommended humidifier -drink 6-8 glasses of water per day.   10) fatigue -checked B12, folate, TSH, vitamin D, magnesium and discussed bloodwork with patient and husband: all in normal range except for suboptimal Vitamin D- high dose supplement prescribed  11) OSA -discussed that OSA can contribute to daytime fatigue, weight gain, anxiety, and chronic conditions so it is good to get this condition  treated.   8 minutes spent in discussion referral to another neurologist, her new OSA diagnosis and the potential symptoms and conditions that can arise from OSA and this the importance of treating it.

## 2021-08-20 ENCOUNTER — Telehealth: Payer: Medicare Other | Admitting: Physical Medicine and Rehabilitation

## 2021-08-20 DIAGNOSIS — R29898 Other symptoms and signs involving the musculoskeletal system: Secondary | ICD-10-CM | POA: Diagnosis not present

## 2021-08-21 ENCOUNTER — Other Ambulatory Visit: Payer: Self-pay

## 2021-08-21 ENCOUNTER — Ambulatory Visit: Payer: Medicare Other

## 2021-08-21 ENCOUNTER — Ambulatory Visit: Payer: Medicare Other | Admitting: Occupational Therapy

## 2021-08-21 DIAGNOSIS — M6281 Muscle weakness (generalized): Secondary | ICD-10-CM

## 2021-08-21 DIAGNOSIS — R2681 Unsteadiness on feet: Secondary | ICD-10-CM

## 2021-08-21 DIAGNOSIS — R2689 Other abnormalities of gait and mobility: Secondary | ICD-10-CM | POA: Diagnosis not present

## 2021-08-21 DIAGNOSIS — R262 Difficulty in walking, not elsewhere classified: Secondary | ICD-10-CM | POA: Diagnosis not present

## 2021-08-21 DIAGNOSIS — I890 Lymphedema, not elsewhere classified: Secondary | ICD-10-CM

## 2021-08-21 DIAGNOSIS — R269 Unspecified abnormalities of gait and mobility: Secondary | ICD-10-CM | POA: Diagnosis not present

## 2021-08-21 NOTE — Therapy (Signed)
[place] MAIN [facility] SERVICES [address] [place], [place], [address] Phone: [phone]   Fax:  [phone]  Physical Therapy Treatment  Patient Details  Name: Carly [name] MRN: [id number] Date of Birth: Mar 08, 1951 Referring Provider (PT): Einar Pheasant MD   Encounter Date: 08/21/2021   PT End of Session - 08/21/21 1118     Visit Number 3    Number of Visits 24    Date for PT Re-Evaluation 10/31/21    Authorization Time Period 08/08/21-10/31/20    Progress Note Due on Visit 10    PT Start Time 0850    PT Stop Time 0930    PT Time Calculation (min) 40 min    Equipment Utilized During Treatment Gait belt    Activity Tolerance Patient tolerated treatment well    Behavior During Therapy WFL for tasks assessed/performed             Past Medical History:  Diagnosis Date   Allergy    Anxiety    Aspiration pneumonia ([place])    Depression    Frequent headaches    H/O   GERD (gastroesophageal reflux disease)    History of chicken pox    History of colon polyps    Hx of migraines    Multiple sclerosis ([place])    PONV (postoperative nausea and vomiting)     Past Surgical History:  Procedure Laterality Date   BACK SURGERY     CHOLECYSTECTOMY     COLONOSCOPY WITH PROPOFOL N/A 05/18/2017   Procedure: COLONOSCOPY WITH PROPOFOL;  Surgeon: [name], MD;  Location: [facility] ENDOSCOPY;  Service: Endoscopy;  Laterality: N/A;   FOOT SURGERY  2015   GALLBLADDER SURGERY  2008   HARDWARE REMOVAL Left 02/14/2016   Procedure: LEFT FOOT REMOVAL DEEP IMPLANT;  Surgeon: [name], MD;  Location: [place];  Service: Orthopedics;  Laterality: Left;   HERNIA REPAIR     Inguinal Hernia Repair   SPINE SURGERY  2014    There were no vitals filed for this visit.   Subjective Assessment - 08/21/21 0850     Subjective Pt reports no pain currently. Pt reports she has been doing her HEP and that it's helping her.    Patient  is accompained by: Family member    Pertinent History Pt reports she is in PT on order to allow for improvement with walking. Pt reports she has not been able to get [facility] PT because she is being seen by outpatient OT services. Pt reports she was walking  and her caregiver who helped her walk had a heart attack. Pt reports the last time she was walking regularly was about 2 years ago. She reports she gets up to [facility] with some assistance from her husband. Pt has walker, rollator and WC at home. Pt reports she used them often until her caregiver had the heart attack. Pt switched to new MS medication a couple years ago and attributes some of her dificits to the medicaiton change. Pt has significant difficulty with any activities involving standing. Pt lives in home wiht ramp to enter. House is one story with an upstairs but she lives primarilly on the first floor. Pt has shower which she can access. 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 She has weighted bracelets she has been using for arm strength. Pt reports she has not been to outpatient PT in the past but she has had [facility] in the past.  Pt reports swimming in past for exercise. She also reports she is moving to [place] [facility] in January.    Limitations Standing;Walking    How long can you stand comfortably? 15 min.    How long can you walk comfortably? 10 min.    Patient Stated Goals Standing, walking, transfers    Currently in Pain? No/denies    Pain Onset Other (comment)   leg pain and swelling first noticed during long care trip ~ 7 hours           INTERVENTIONS  Seated ankle rockers 2x20   Seated marches 2x20 with TC for technique; pt rates exercise as medium. Continued cuing for breathing technique --added 2.5# ankle weights to each LE, pt performs 10 reps each LE; Pt rates exercise as challenging on LLE and rates easy on R   LAQ - 15x alternating LEs with 3 sec holds at end range; rates easy  --progressed to performing  with 2# ankle weights for 20 reps alternating each LE  Seated adductor squeezes with ball 3x15; rates medium, requires frequent multi-modal cues to improve BLE adductor activation  Seated RTB hip abduction 2x20 B  STS with min a x2 from WC to RW; pt unable to achieve full upright position. Cuing for pt to push off armrests instead of walker.   STS with CGA with pt pulling on support bar to upright position 1x.   Instruction in readjusting sitting position in chair to decrease sacral sitting. PT cues pt to push through [place] and [place] to push back into chair and sit with upright posture. Pt performs 2x and able to complete activity independently, improving positioning.     Pt educated throughout session about proper posture and technique with exercises. Improved exercise technique, movement at target joints, use of target muscles after min to mod verbal, visual, tactile cues.    PT Education - 08/21/21 1118     Education provided Yes    Education Details exercise technique, body mechanics    Person(s) Educated Patient    Methods Explanation;Demonstration;Tactile cues;Verbal cues    Comprehension Verbalized understanding;Returned demonstration;Verbal cues required;Tactile cues required;Need further instruction              PT Short Term Goals - 08/08/21 1724       PT SHORT TERM GOAL #1   Title Patient will be independent in home exercise program to improve strength/mobility for better functional independence with ADLs.    Baseline Pt does not have HEP    Time 4    Period Weeks    Status New    Target Date 09/05/21      PT SHORT TERM GOAL #2   Title Patient will increase FOTO score to equal to or greater than 4    to demonstrate statistically significant improvement in mobility and quality of life.    Baseline 28    Time 4    Period Weeks    Target Date 09/05/21               PT Long Term Goals - 08/08/21 1720       PT LONG TERM GOAL #1   Title Patient will be  independent with advanced and progressive home program for strength and endurance in order to transition to self management    Baseline Pt does not have current formal HEP    Time 4    Period Weeks    Status New    Target Date 10/03/21  PT LONG TERM GOAL #2   Title pt's FOTO score will improve by 4 points or more indicating improved confidence with daily tasks at home    Baseline 28 at intake    Time 6    Period Weeks    Status New      PT LONG TERM GOAL #3   Title Patient will deny any falls over past 6 weeks to demonstrate improved safety awareness at home and work.    Baseline Pt reports 3 falls in previous 6 months    Time 6    Period Weeks    Status New      PT LONG TERM GOAL #4   Title Patient will perform sit to and from stand from her wheelchair with min assist and no upper extremity support in order to indicate improvement in function.    Baseline Patient requires mod assist from author and bilateral lower extremity support to transition to and from sitting    Time 12    Period Weeks    Status New    Target Date 10/31/21                   Plan - 08/21/21 1119     Clinical Impression Statement Pt with excellent motivation to participate in session. PT reinforced technique with HEP and instructed pt in technique to readjust position in chair to reduce sacral sitting. Pt was able to progress some interventions with addition of increased resistance. Pt tolerated interventions well and reports no pain. The pt will benefit from further skilled PT to increase activity tolerance, strength and mobility.    Personal Factors and Comorbidities Age;Comorbidity 1;Comorbidity 2;Comorbidity 3+;Time since onset of injury/illness/exacerbation    Comorbidities Depression, anxiety, HTN, MS, DDD, GERD    Examination-Activity Limitations Bed Mobility;Carry;Continence;Dressing;Hygiene/Grooming;Lift;Locomotion Level;Squat;Stairs;Stand;Toileting;Transfers     Examination-Participation Restrictions Cleaning;Community Activity;Laundry;Meal Prep;Shop    Stability/Clinical Decision Making Evolving/Moderate complexity    Rehab Potential Fair    Clinical Impairments Affecting Rehab Potential (+) family support, motivated (-) MS, decreased confidence, co morbidities    PT Frequency 2x / week    PT Duration 12 weeks    PT Treatment/Interventions Patient/family education;Gait training;Neuromuscular re-education;Therapeutic exercise;Manual techniques;ADLs/Self Care Home Management;Stair training;DME Instruction;Functional mobility training;Therapeutic activities;Balance training;Wheelchair mobility training;Energy conservation;Joint Manipulations    PT Next Visit Plan exercises for strength, endurance, private treatment room, develop HEP    PT Home Exercise Plan see patient instructions;    Consulted and Agree with Plan of Care Patient             Patient will benefit from skilled therapeutic intervention in order to improve the following deficits and impairments:  Decreased strength, Decreased balance, Decreased activity tolerance, Decreased endurance, Difficulty walking, Abnormal gait, Improper body mechanics, Decreased mobility, Hypomobility  Visit Diagnosis: Muscle weakness (generalized)  Other abnormalities of gait and mobility  Unsteadiness on feet     Problem List Patient Active Problem List   Diagnosis Date Noted   Rash 01/27/2021   History of CVA (cerebrovascular accident) 01/05/2021   PBA (pseudobulbar affect)    MS (multiple sclerosis) ([facility])    Anxiety and depression    White matter periventricular infarction ([place]) 12/13/2020   Generalized anxiety disorder 12/11/2020   Acute focal neurological deficit 12/08/2020   Edema 09/28/2020   Bloating 08/25/2020   Environmental allergies 07/17/2019   Hyperglycemia 07/17/2019   Cough 06/06/2019   Memory change 05/29/2019   Abnormal liver function tests 01/09/2019   Hemorrhoid  09/27/2018  Nausea 08/11/2018   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   Depression with anxiety 02/04/2018   Cognitive and behavioral changes 02/04/2018   Constipation 11/17/2017   Hx of adenomatous colonic polyps 06/06/2016   Lower extremity edema 01/13/2016   Bilateral ovarian cysts 10/16/2015   Health care maintenance 10/16/2015   Colon polyp 01/25/2015   Bone/cartilage disorder 01/25/2015   Headache 11/18/2014   Depression, recurrent ([place]) 11/18/2014   Greater tuberosity of humerus fracture 11/18/2014   Multiple sclerosis ([place]) 11/18/2014   Unsteady gait 11/18/2014   Dizziness 11/18/2014   Inconclusive mammogram 03/15/2013   S/P lumbar spinal fusion 01/04/2013   Surgery, other elective 12/03/2012   DDD (degenerative disc disease), lumbosacral 10/15/2012   Back ache 09/06/2012   Hypertension 09/06/2012   Back pain 09/06/2012   Hypercholesterolemia 01/28/2012   Climacteric 01/28/2012   Routine general medical examination at a health care facility 01/28/2012   Avitaminosis D 01/28/2012    Zollie Pee, PT 08/21/2021, 11:25 AM  [place] MAIN [facility] SERVICES [address] [place], [place], [address] Phone: [phone]   Fax:  [phone]  Name: Carly [name] MRN: [id number] Date of Birth: 1950/12/21

## 2021-08-21 NOTE — Patient Instructions (Signed)

## 2021-08-21 NOTE — Therapy (Signed)
[place] MAIN [facility] SERVICES [address] [place], [place], [address] Phone: [phone]   Fax:  [phone]  Occupational Therapy Treatment  Patient Details  Name: Carly [name] MRN: [id number] Date of Birth: [birth year] Referring Provider (OT): Einar Pheasant, MD   Encounter Date: 08/21/2021   OT End of Session - 08/21/21 1000     Visit Number 9    Number of Visits 36    Date for OT Re-Evaluation 09/11/21    Authorization Type Initial eval 06/12/21    OT Start Time 1000    OT Stop Time 1110    OT Time Calculation (min) 70 min    Equipment Utilized During Copywriter, advertising'; [employer] Flexitouch    Activity Tolerance Other (comment);Patient tolerated treatment well;No increased pain   limited by anxiety   Behavior During Therapy Anxious   Pt tearful at beginning of session, but relaxed and was more comfortable as session  progressed.            Past Medical History:  Diagnosis Date   Allergy    Anxiety    Aspiration pneumonia ([facility])    Depression    Frequent headaches    H/O   GERD (gastroesophageal reflux disease)    History of chicken pox    History of colon polyps    Hx of migraines    Multiple sclerosis ([facility])    PONV (postoperative nausea and vomiting)     Past Surgical History:  Procedure Laterality Date   BACK SURGERY     CHOLECYSTECTOMY     COLONOSCOPY WITH PROPOFOL N/A 05/18/2017   Procedure: COLONOSCOPY WITH PROPOFOL;  Surgeon: [name], MD;  Location: [facility] ENDOSCOPY;  Service: Endoscopy;  Laterality: N/A;   FOOT SURGERY  2015   GALLBLADDER SURGERY  2008   HARDWARE REMOVAL Left 02/14/2016   Procedure: LEFT FOOT REMOVAL DEEP IMPLANT;  Surgeon: [name], MD;  Location: [place];  Service: Orthopedics;  Laterality: Left;   HERNIA REPAIR     Inguinal Hernia Repair   SPINE SURGERY  2014    There were no vitals filed for this visit.   Subjective Assessment - 08/21/21  1010     Subjective  Carly [name] presents to OT for Rx visit  9/36 to addressBLE lymphedema (LE). Pt is accompanied by her husband, Carly [name]. and paid attendant. Pt denies pain n legs associated with LE. "She's been getting in and out of bed so much better" "PT is really helping and the leg is lighter." Manufacturer's rep for [employer], Jerrell Mylar, is here today to assist w/ trial of basic lymphedema "pump" (A0762) and Flexitouch advanced sequential pneumatic compression device ([id number]) to assist with self-management of BLE/BLQ lymphedema (I89.0) over time at home.    Patient is accompanied by: Family member    Pertinent History Relevant to limb swelling: Depression /anxiety, MS, HTN. 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 weakness,    Special Tests Intake FOTO: 29/100 (functional outcome score)    Patient Stated Goals to be able to walk better, to get this swelling down. I hate it.    Pain Onset Other (comment)   leg pain and swelling first noticed during long care trip ~ 7 hours  OT Education - 08/21/21 1017     Education Details Pt/ family education for basic and advanced sequential pneumatic compression devices, or "pump", including clinical indications, how thy work, how the basic pneumatic "pump" differs from the advanced Flexitouch, indications, contraindications and precaution, s and care and use routines for pneumatic compression devices. Patient's and spouse's questions were answered throughout trial and handouts and Internet resources given for reference.    Person(s) Educated Patient;Spouse    Methods Explanation;Demonstration;Handout;Verbal cues    Comprehension Verbalized understanding;Returned demonstration;Need further instruction;Verbal  cues required;Tactile cues required                 OT Long Term Goals - 06/13/21 0900       OT LONG TERM GOAL #1   Title Given this patient's risk-adjustment variables, her Intake Functional Status score of 29/100 on the FOTO tool, patient will experience at least an increase in function of 3 points for a score of 32/100.    Baseline 97/100    Time 12    Period Weeks    Status New    Target Date 09/11/21      OT LONG TERM GOAL #2   Title Pt will be able to verbalize at least 4 lymphedema precautions and prevention strategies with Max caregiver assist to reduce infection risk limit progression of lymphedema.    Baseline dependent    Time 6    Period Days    Status New    Target Date --   6th OT Rx visit     OT LONG TERM GOAL #3   Title Pt will be able to apply multi-layer, short stretch compression wraps to one leg at a time using correct gradient techniques with max assistance in an effort  to return the affected  limb(s)  to premorbid size and shape, to limit pain and infection risk, and to improve functional mobility and ambulation  for ADLs.    Baseline dependent    Time 6    Period Days    Status New    Target Date --   6th OT Rx visit     OT LONG TERM GOAL #4   Title Pt will achieve at least a 10% limb volume reduction bilaterally to limit lymphedema progression, to improve tissue health and limit infection risk, and to increase tissue flexibility essential for optimal AROM and safe functional ambulation and mobility.    Baseline dependent    Time 12    Period Weeks    Status New    Target Date 09/11/21      OT LONG TERM GOAL #5   Title With Max caregiver assistance Pt will achieve and sustain a least 85% compliance with all LE self-care home program components throughout Intensive Phase CDT, including frequent elevation when seated, daily skin inspection and care, lymphatic pumping ther ex, 23/7 compression wraps and simple self-MLD, to sustain clinical gains  made in CDT and to limit lymphedema progression and further functional decline.    Baseline dependent    Time 12    Period Weeks    Status New    Target Date 09/11/21      Long Term Additional Goals   Additional Long Term Goals Yes      OT LONG TERM GOAL #6   Title Using assistive devices (modified independence)  and Max CG assistance Pt will be able to don and doff appropriate daytime compression garments  to limit lymphatic re-accumulation and LE progression before transitioning  to self-management phase of CDT.    Baseline dependent    Time 12    Period Weeks    Status New    Target Date 09/11/21                   Plan - 08/21/21 1042     Clinical Impression Statement Helene Bernstein presents with stage II, BLE lymphedema 2/2 complications of multiple sclerosis. She has diligently participated in conservative lymphedema care , Complete Decongestive Therapy, 2 1-2 x weekly since 06/12/21, and although she demonstrates progress towards self care goals, BLE swelling and associated leg pain/ discomfort persist. Mrs.Tomaro completed trials of the [employer] basic and advanced sequential pneumatic compression devices, the [place]' and the Flexitouch , respectively to the RLE/ RLQ  in clinic this morning. The one-time trial of the basic Lyndal Pulley' utilized 30 mmHg for 20 minutes. The trial was cancelled due to increased hip measurements from 117cm initially to 118cm post trial.  Pt trialed the advanced Flexitouch Plus pneumatic compression device ([id number]) using 30-50 mmHg compression on the same limb for the remainder of the 60 minute session. without increased pain.  The advanced Flexitouch device with BLE garments and truncal garment that encompasses the hips and abdomen is medically necessary to assist this patient with optimal daily lymphedema self-care over time at home. The advanced, 32-chamber Flexitouch is the only available sequential pneumatic compression device that provides  proximal to distal fluid return via regional lymph nodes, deep abdominal pathways and the thoracic duct to the heart. The basic pneumatic pump is not appropriate for this patient because it provides distal-to-proximal, retrograde massage mobilizing tissue fluid against back abruptly ending distal to the regional lymph nodes in the groin distal to the inguinal lymph nodes in the thigh. The Flexitouch Plus [phone] Advanced Pneumatic Compression Device is medically necessary to treat this patient's symptoms of bilateral lower extremity and hip/abdominal lymphedema. Retaught Pt and paid caregiver again today how to correctly apply compression wraps. Unable to fit recommended compression stockings b/c Pt ordered incorrect garment. Recommended Pt / CG call garment vendor and order garments    from the printed list I provided last week.  Cont as per POC.    OT Occupational Profile and History Comprehensive Assessment- Review of records and extensive additional review of physical, cognitive, psychosocial history related to current functional performance    Occupational performance deficits (Please refer to evaluation for details): ADL's;IADL's;Work;Leisure;Social Participation;Other   body image   Body Structure / Function / Physical Skills ADL;Edema;Skin integrity;Pain;Decreased knowledge of precautions;Decreased knowledge of use of DME;IADL    Rehab Potential Fair    Clinical Decision Making Multiple treatment options, significant modification of task necessary    Comorbidities Affecting Occupational Performance: Presence of comorbidities impacting occupational performance    Comorbidities impacting occupational performance description: See SUBJECTIVE for relevant Dx    Modification or Assistance to Complete Evaluation  Max significant modification of tasks or assist is necessary to complete    OT Frequency 2x / week    OT Duration 12 weeks   and PRN for follow along support and caregiver support   OT  Treatment/Interventions Self-care/ADL training;Therapeutic exercise;Coping strategies training;Therapeutic activities;Manual lymph drainage;Energy conservation;Manual Therapy;Other (comment);DME and/or AE instruction;Compression bandaging;Patient/family education   skin care to limit infection risk and increase skin flexibility   Plan Comple Decongestive Therapy: Manual Lymphatic Drainage (MLD, skin care, therapeutic exercise, compression therapy using short stretch    wraps and compression garments/ devices to retain clinical gains  Recommended Other Services Consider trial with advanced sequential pneumatic compression device, or "pump". Flexitouch device recommended over all as this stimulates lymphatics proximally to distal duplicating anatomic function. Need MD clearance due to Hx of CVA and MS. Pt may not tolerate 2/2 anxiety, but will explore her interest. Consider alternative to Amlodipine due to limb swelling side effect.    Consulted and Agree with Plan of Care Family member/caregiver;Patient    Family Member Consulted Spouse, Bill             Patient will benefit from skilled therapeutic intervention in order to improve the following deficits and impairments:   Body Structure / Function / Physical Skills: ADL, Edema, Skin integrity, Pain, Decreased knowledge of precautions, Decreased knowledge of use of DME, IADL       Visit Diagnosis: Lymphedema, not elsewhere classified    Problem List Patient Active Problem List   Diagnosis Date Noted   Rash 01/27/2021   History of CVA (cerebrovascular accident) 01/05/2021   PBA (pseudobulbar affect)    MS (multiple sclerosis) ([place])    Anxiety and depression    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   Generalized anxiety disorder 12/11/2020   Acute focal neurological deficit 12/08/2020   Edema 09/28/2020   Bloating 08/25/2020   Environmental allergies 07/17/2019   Hyperglycemia 07/17/2019   Cough 06/06/2019    Memory change 05/29/2019   Abnormal liver function tests 01/09/2019   Hemorrhoid 09/27/2018   Nausea 08/11/2018   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   Depression with anxiety 02/04/2018   Cognitive and behavioral changes 02/04/2018   Constipation 11/17/2017   Hx of adenomatous colonic polyps 06/06/2016   Lower extremity edema 01/13/2016   Bilateral ovarian cysts 10/16/2015   Health care maintenance 10/16/2015   Colon polyp 01/25/2015   Bone/cartilage disorder 01/25/2015   Headache 11/18/2014   Depression, recurrent ([place]) 11/18/2014   Greater tuberosity of humerus fracture 11/18/2014   Multiple sclerosis ([place]) 11/18/2014   Unsteady gait 11/18/2014   Dizziness 11/18/2014   Inconclusive mammogram 03/15/2013   S/P lumbar spinal fusion 01/04/2013   Surgery, other elective 12/03/2012   DDD (degenerative disc disease), lumbosacral 10/15/2012   Back ache 09/06/2012   Hypertension 09/06/2012   Back pain 09/06/2012   Hypercholesterolemia 01/28/2012   Climacteric 01/28/2012   Routine general medical examination at a health care facility 01/28/2012   Avitaminosis D 01/28/2012    Andrey Spearman, MS, OTR/L, [facility] 08/21/21 1:07 PM   [place] MAIN [facility] SERVICES [address] [place], [place], [address] Phone: [phone]   Fax:  [phone]  Name: NIKISHA FLEECE MRN: [id number] Date of Birth: [birth year]

## 2021-08-22 ENCOUNTER — Encounter: Payer: Self-pay | Admitting: Hematology and Oncology

## 2021-08-22 DIAGNOSIS — G4733 Obstructive sleep apnea (adult) (pediatric): Secondary | ICD-10-CM | POA: Diagnosis not present

## 2021-08-22 DIAGNOSIS — R29898 Other symptoms and signs involving the musculoskeletal system: Secondary | ICD-10-CM | POA: Diagnosis not present

## 2021-08-22 DIAGNOSIS — R0609 Other forms of dyspnea: Secondary | ICD-10-CM | POA: Diagnosis not present

## 2021-08-23 ENCOUNTER — Telehealth: Payer: Self-pay | Admitting: Internal Medicine

## 2021-08-23 ENCOUNTER — Ambulatory Visit: Payer: Medicare Other

## 2021-08-23 ENCOUNTER — Other Ambulatory Visit: Payer: Self-pay

## 2021-08-23 DIAGNOSIS — R2689 Other abnormalities of gait and mobility: Secondary | ICD-10-CM

## 2021-08-23 DIAGNOSIS — R269 Unspecified abnormalities of gait and mobility: Secondary | ICD-10-CM | POA: Diagnosis not present

## 2021-08-23 DIAGNOSIS — I890 Lymphedema, not elsewhere classified: Secondary | ICD-10-CM | POA: Diagnosis not present

## 2021-08-23 DIAGNOSIS — R262 Difficulty in walking, not elsewhere classified: Secondary | ICD-10-CM | POA: Diagnosis not present

## 2021-08-23 DIAGNOSIS — R2681 Unsteadiness on feet: Secondary | ICD-10-CM | POA: Diagnosis not present

## 2021-08-23 DIAGNOSIS — M6281 Muscle weakness (generalized): Secondary | ICD-10-CM | POA: Diagnosis not present

## 2021-08-23 NOTE — Therapy (Signed)
[place] MAIN [facility] SERVICES [address] [place], [place], [address] Phone: [phone]   Fax:  [phone]  Physical Therapy Treatment  Patient Details  Name: Carly [name] MRN: [id number] Date of Birth: 07-Feb-1951 Referring Provider (PT): Einar Pheasant MD   Encounter Date: 08/23/2021   PT End of Session - 08/23/21 0945     Visit Number 4    Number of Visits 24    Date for PT Re-Evaluation 10/31/21    Authorization Time Period 08/08/21-10/31/20    Progress Note Due on Visit 10    PT Start Time 0853    PT Stop Time 0936    PT Time Calculation (min) 43 min    Equipment Utilized During Treatment Gait belt    Activity Tolerance Patient tolerated treatment well    Behavior During Therapy WFL for tasks assessed/performed             Past Medical History:  Diagnosis Date   Allergy    Anxiety    Aspiration pneumonia ([place])    Depression    Frequent headaches    H/O   GERD (gastroesophageal reflux disease)    History of chicken pox    History of colon polyps    Hx of migraines    Multiple sclerosis ([place])    PONV (postoperative nausea and vomiting)     Past Surgical History:  Procedure Laterality Date   BACK SURGERY     CHOLECYSTECTOMY     COLONOSCOPY WITH PROPOFOL N/A 05/18/2017   Procedure: COLONOSCOPY WITH PROPOFOL;  Surgeon: [name], MD;  Location: [facility] ENDOSCOPY;  Service: Endoscopy;  Laterality: N/A;   FOOT SURGERY  2015   GALLBLADDER SURGERY  2008   HARDWARE REMOVAL Left 02/14/2016   Procedure: LEFT FOOT REMOVAL DEEP IMPLANT;  Surgeon: [name], MD;  Location: [place];  Service: Orthopedics;  Laterality: Left;   HERNIA REPAIR     Inguinal Hernia Repair   SPINE SURGERY  2014    There were no vitals filed for this visit.   Subjective Assessment - 08/23/21 0857     Subjective Pt reports she feels lymphedema has worsened and spread to legs and stomach. Pt being seen for lymphdema  treatment. Pt reports she has not been taking her diueretic as prescribed in past 3 days because she is worried about having an accident.    Patient is accompained by: Family member    Pertinent History Pt reports she is in PT on order to allow for improvement with walking. Pt reports she has not been able to get [facility] PT because she is being seen by outpatient OT services. Pt reports she was walking  and her caregiver who helped her walk had a heart attack. Pt reports the last time she was walking regularly was about 2 years ago. She reports she gets up to [facility] with some assistance from her husband. Pt has walker, rollator and WC at home. Pt reports she used them often until her caregiver had the heart attack. Pt switched to new MS medication a couple years ago and attributes some of her dificits to the medicaiton change. Pt has significant difficulty with any activities involving standing. Pt lives in home wiht ramp to enter. House is one story with an upstairs but she lives primarilly on the first floor. Pt has shower which she can access. 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 She has weighted bracelets she has been  using for arm strength. Pt reports she has not been to outpatient PT in the past but she has had [place] in the past. Pt reports swimming in past for exercise. She also reports she is moving to [facility] in January.    Limitations Standing;Walking    How long can you stand comfortably? 15 min.    How long can you walk comfortably? 10 min.    Patient Stated Goals Standing, walking, transfers    Currently in Pain? No/denies    Pain Onset Other (comment)   leg pain and swelling first noticed during long care trip ~ 7 hours             INTERVENTIONS - CGA provided throughout unless otherwise indicated   Seated DF 2x20 Seated PF 2x20  STS with pulling BUEs on // bars 10x, 5x with up to min ax1  Seated BP: 145/81 mmHg, 100 bpm   Ambulation length of // bars  3x length of bars with WC follow, close CGA and heavy BUE support on bar.  STS to adjust position in chair 1x  Seated LAQ with 3# ankle weights on BLEs - 2x20  Seated marches with 3# ankle weights on BLEs - 10x each LE --removed weights and applied TC for AROM 2x20   PT instructs pt on importance of taking medications as her doctor prescribed and to contact her doctor should she have concerns about her medications. Pt verbalized understanding.    Pt educated throughout session about proper posture and technique with exercises. Improved exercise technique, movement at target joints, use of target muscles after min to mod verbal, visual, tactile cues.     PT Short Term Goals - 08/08/21 1724       PT SHORT TERM GOAL #1   Title Patient will be independent in home exercise program to improve strength/mobility for better functional independence with ADLs.    Baseline Pt does not have HEP    Time 4    Period Weeks    Status New    Target Date 09/05/21      PT SHORT TERM GOAL #2   Title Patient will increase FOTO score to equal to or greater than 4    to demonstrate statistically significant improvement in mobility and quality of life.    Baseline 28    Time 4    Period Weeks    Target Date 09/05/21               PT Long Term Goals - 08/08/21 1720       PT LONG TERM GOAL #1   Title Patient will be independent with advanced and progressive home program for strength and endurance in order to transition to self management    Baseline Pt does not have current formal HEP    Time 4    Period Weeks    Status New    Target Date 10/03/21      PT LONG TERM GOAL #2   Title pt's FOTO score will improve by 4 points or more indicating improved confidence with daily tasks at home    Baseline 28 at intake    Time 6    Period Weeks    Status New      PT LONG TERM GOAL #3   Title Patient will deny any falls over past 6 weeks to demonstrate improved safety awareness at home and work.     Baseline Pt reports 3 falls in previous 6 months  Time 6    Period Weeks    Status New      PT LONG TERM GOAL #4   Title Patient will perform sit to and from stand from her wheelchair with min assist and no upper extremity support in order to indicate improvement in function.    Baseline Patient requires mod assist from author and bilateral lower extremity support to transition to and from sitting    Time 12    Period Weeks    Status New    Target Date 10/31/21                   Plan - 08/23/21 0945     Clinical Impression Statement Pt able to progress to ambulating in // bars with close CGA-min a this session. PT did spend poriton on session educating pt on importance of following her doctor's instructions for taking her medications and to contact her doctor if she has questions/concerns. The pt will benefit from further skilled PT to improve activity tolerance, strength, endruance and mobility.    Personal Factors and Comorbidities Age;Comorbidity 1;Comorbidity 2;Comorbidity 3+;Time since onset of injury/illness/exacerbation    Comorbidities Depression, anxiety, HTN, MS, DDD, GERD    Examination-Activity Limitations Bed Mobility;Carry;Continence;Dressing;Hygiene/Grooming;Lift;Locomotion Level;Squat;Stairs;Stand;Toileting;Transfers    Examination-Participation Restrictions Cleaning;Community Activity;Laundry;Meal Prep;Shop    Stability/Clinical Decision Making Evolving/Moderate complexity    Rehab Potential Fair    Clinical Impairments Affecting Rehab Potential (+) family support, motivated (-) MS, decreased confidence, co morbidities    PT Frequency 2x / week    PT Duration 12 weeks    PT Treatment/Interventions Patient/family education;Gait training;Neuromuscular re-education;Therapeutic exercise;Manual techniques;ADLs/Self Care Home Management;Stair training;DME Instruction;Functional mobility training;Therapeutic activities;Balance training;Wheelchair mobility  training;Energy conservation;Joint Manipulations    PT Next Visit Plan exercises for strength, endurance, private treatment room, develop HEP    PT Home Exercise Plan see patient instructions;    Consulted and Agree with Plan of Care Patient             Patient will benefit from skilled therapeutic intervention in order to improve the following deficits and impairments:  Decreased strength, Decreased balance, Decreased activity tolerance, Decreased endurance, Difficulty walking, Abnormal gait, Improper body mechanics, Decreased mobility, Hypomobility  Visit Diagnosis: Muscle weakness (generalized)  Other abnormalities of gait and mobility  Unsteadiness on feet     Problem List Patient Active Problem List   Diagnosis Date Noted   Rash 01/27/2021   History of CVA (cerebrovascular accident) 01/05/2021   PBA (pseudobulbar affect)    MS (multiple sclerosis) (HCC)    Anxiety and depression    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   Generalized anxiety disorder 12/11/2020   Acute focal neurological deficit 12/08/2020   Edema 09/28/2020   Bloating 08/25/2020   Environmental allergies 07/17/2019   Hyperglycemia 07/17/2019   Cough 06/06/2019   Memory change 05/29/2019   Abnormal liver function tests 01/09/2019   Hemorrhoid 09/27/2018   Nausea 08/11/2018   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   Depression with anxiety 02/04/2018   Cognitive and behavioral changes 02/04/2018   Constipation 11/17/2017   Hx of adenomatous colonic polyps 06/06/2016   Lower extremity edema 01/13/2016   Bilateral ovarian cysts 10/16/2015   Health care maintenance 10/16/2015   Colon polyp 01/25/2015   Bone/cartilage disorder 01/25/2015   Headache 11/18/2014   Depression, recurrent ([place]) 11/18/2014   Greater tuberosity of humerus fracture 11/18/2014   Multiple sclerosis ([place]) 11/18/2014   Unsteady gait 11/18/2014   Dizziness 11/18/2014   Inconclusive mammogram 03/15/2013  S/P  lumbar spinal fusion 01/04/2013   Surgery, other elective 12/03/2012   DDD (degenerative disc disease), lumbosacral 10/15/2012   Back ache 09/06/2012   Hypertension 09/06/2012   Back pain 09/06/2012   Hypercholesterolemia 01/28/2012   Climacteric 01/28/2012   Routine general medical examination at a health care facility 01/28/2012   Avitaminosis D 01/28/2012    Zollie Pee, PT 08/23/2021, 9:48 AM  [place], [place], [address] Phone: [phone]   Fax:  [phone]  Name: REIZY DUNLOW MRN: [id number] Date of Birth: [birth year]

## 2021-08-23 NOTE — Telephone Encounter (Signed)
Patient's husband came into office asking for a medical clearance letter from Dr Nicki Reaper for patient's cataract surgery on 08/29/2021.

## 2021-08-24 ENCOUNTER — Encounter: Payer: Self-pay | Admitting: Internal Medicine

## 2021-08-24 NOTE — Assessment & Plan Note (Signed)
Discussed colonoscopy.  Last colonoscopy 05/2017.  Recommended f/u in 05/2020.  S/p recent cva so will postpone for now.  Will need in future.  Pt in agreement.

## 2021-08-24 NOTE — Assessment & Plan Note (Signed)
Blood pressure as outlined.  Continue losartan and amlodipine.  Follow pressures.  Follow metabolic panel.   

## 2021-08-24 NOTE — Assessment & Plan Note (Signed)
PT for treatment lymphedema.  Continue.  Improved.

## 2021-08-24 NOTE — Assessment & Plan Note (Signed)
Low-carb diet and exercise.  Follow metabolic panel and A1c. 

## 2021-08-24 NOTE — Assessment & Plan Note (Signed)
Continue risk factor modification.  Appears to be tolerating statin.  Blood pressure better. Continue plavix.

## 2021-08-24 NOTE — Assessment & Plan Note (Signed)
Continue crestor.  Low cholesterol diet and exercise. Follow lipid panel and liver function tests.   

## 2021-08-24 NOTE — Assessment & Plan Note (Signed)
Followed by neurology.  Discussed need for strengthening exercise, etc. Continue exercises/therapy.

## 2021-08-24 NOTE — Assessment & Plan Note (Signed)
Bowels have been doing better.  Recently noticed some decrease in bowel movements.  Miralax.  Fiber.  Follow.

## 2021-08-24 NOTE — Assessment & Plan Note (Signed)
Followed by psychiatry.  On seroquel.  Continues remeron.  Overall appears to be doing better. Follow.

## 2021-08-26 ENCOUNTER — Ambulatory Visit: Payer: Medicare Other | Admitting: Occupational Therapy

## 2021-08-26 ENCOUNTER — Ambulatory Visit: Payer: Medicare Other

## 2021-08-26 ENCOUNTER — Other Ambulatory Visit: Payer: Self-pay

## 2021-08-26 DIAGNOSIS — M6281 Muscle weakness (generalized): Secondary | ICD-10-CM | POA: Diagnosis not present

## 2021-08-26 DIAGNOSIS — R2681 Unsteadiness on feet: Secondary | ICD-10-CM | POA: Diagnosis not present

## 2021-08-26 DIAGNOSIS — R269 Unspecified abnormalities of gait and mobility: Secondary | ICD-10-CM | POA: Diagnosis not present

## 2021-08-26 DIAGNOSIS — I890 Lymphedema, not elsewhere classified: Secondary | ICD-10-CM

## 2021-08-26 DIAGNOSIS — R262 Difficulty in walking, not elsewhere classified: Secondary | ICD-10-CM | POA: Diagnosis not present

## 2021-08-26 DIAGNOSIS — R278 Other lack of coordination: Secondary | ICD-10-CM

## 2021-08-26 DIAGNOSIS — R2689 Other abnormalities of gait and mobility: Secondary | ICD-10-CM

## 2021-08-26 NOTE — Therapy (Signed)
[place] MAIN [facility] SERVICES [address] [place], [place], [address] Phone: [phone]   Fax:  [phone]  Physical Therapy Treatment  Patient Details  Name: Carly [name] MRN: [id number] Date of Birth: Feb 19, 1951 Referring Provider (PT): Einar Pheasant MD   Encounter Date: 08/26/2021   PT End of Session - 08/26/21 1014     Visit Number 5    Number of Visits 24    Date for PT Re-Evaluation 10/31/21    Authorization Time Period 08/08/21-10/31/20    Progress Note Due on Visit 10    PT Start Time 0922    PT Stop Time 1006    PT Time Calculation (min) 44 min    Equipment Utilized During Treatment Gait belt    Activity Tolerance Patient tolerated treatment well    Behavior During Therapy WFL for tasks assessed/performed             Past Medical History:  Diagnosis Date   Allergy    Anxiety    Aspiration pneumonia ([place])    Depression    Frequent headaches    H/O   GERD (gastroesophageal reflux disease)    History of chicken pox    History of colon polyps    Hx of migraines    Multiple sclerosis ([place])    PONV (postoperative nausea and vomiting)     Past Surgical History:  Procedure Laterality Date   BACK SURGERY     CHOLECYSTECTOMY     COLONOSCOPY WITH PROPOFOL N/A 05/18/2017   Procedure: COLONOSCOPY WITH PROPOFOL;  Surgeon: [name], MD;  Location: [facility] ENDOSCOPY;  Service: Endoscopy;  Laterality: N/A;   FOOT SURGERY  2015   GALLBLADDER SURGERY  2008   HARDWARE REMOVAL Left 02/14/2016   Procedure: LEFT FOOT REMOVAL DEEP IMPLANT;  Surgeon: [name], MD;  Location: [place];  Service: Orthopedics;  Laterality: Left;   HERNIA REPAIR     Inguinal Hernia Repair   SPINE SURGERY  2014    There were no vitals filed for this visit.   Subjective Assessment - 08/26/21 0927     Subjective Pt reports she restarted her medication as prescribed by her doctor. Pt reports no pain.    Patient is  accompained by: Family member    Pertinent History Pt reports she is in PT on order to allow for improvement with walking. Pt reports she has not been able to get [facility] PT because she is being seen by outpatient OT services. Pt reports she was walking  and her caregiver who helped her walk had a heart attack. Pt reports the last time she was walking regularly was about 2 years ago. She reports she gets up to [facility] with some assistance from her husband. Pt has walker, rollator and WC at home. Pt reports she used them often until her caregiver had the heart attack. Pt switched to new MS medication a couple years ago and attributes some of her dificits to the medicaiton change. Pt has significant difficulty with any activities involving standing. Pt lives in home wiht ramp to enter. House is one story with an upstairs but she lives primarilly on the first floor. Pt has shower which she can access. 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 She has weighted bracelets she has been using for arm strength. Pt reports she has not been to outpatient PT in the past but she has had [place] in the past. Pt reports swimming  in past for exercise. She also reports she is moving to [place] [facility] in January.    Limitations Standing;Walking    How long can you stand comfortably? 15 min.    How long can you walk comfortably? 10 min.    Patient Stated Goals Standing, walking, transfers    Currently in Pain? No/denies    Pain Onset Other (comment)   leg pain and swelling first noticed during long care trip ~ 7 hours              INTERVENTIONS - CGA provided throughout unless otherwise indicated   Seated ankle rockers 2x20   Seated LAQ 2x20  STS pulling on // bars to readjust position in chair with CGA x multiple times throughout session. PT cues to throughout to redirect to different activities.   STS with pulling BUEs on // bars 5x for LE endurance/strength  Standing march with BUE support 10x  each LE and close CGA  Pull to STS 2x with heavy BUE support on // bars. Cuing for upright posture, improved quad activation "straighten legs"  Standing march with BUE support 2x10 each LE, cuing to increase hip flexion   Pull to STS 10x with continued heavy BUE support  Seated LAQ with 3# ankle weights on BLEs - 15x each  Seated, lateral hedgehog cone taps with 3# weights on each LE (hedgehogs placed to each side of foot)  Standing endurance 30-45 sec with heavy BUE support on bars, up to min a provided, assists pt back into WC due to fatigue  4 STS, continued pulling to stand, continued heavy BUE support on bars. With stand>sit TC/VC for increased hip flexion to improve posture in WC>   RTB tricep extension in WC - 10x each UE. Multi-modal cues for technique. Pt difficulty with technique. Will need further cuing.     Pt educated throughout session about proper posture and technique with exercises. Improved exercise technique, movement at target joints, use of target muscles after min to mod verbal, visual, tactile cues.     PT Education - 08/26/21 1014     Education provided Yes    Education Details exercise technique    Person(s) Educated Patient    Methods Explanation;Demonstration;Tactile cues;Verbal cues    Comprehension Returned demonstration;Verbalized understanding;Verbal cues required;Tactile cues required;Need further instruction              PT Short Term Goals - 08/08/21 1724       PT SHORT TERM GOAL #1   Title Patient will be independent in home exercise program to improve strength/mobility for better functional independence with ADLs.    Baseline Pt does not have HEP    Time 4    Period Weeks    Status New    Target Date 09/05/21      PT SHORT TERM GOAL #2   Title Patient will increase FOTO score to equal to or greater than 4    to demonstrate statistically significant improvement in mobility and quality of life.    Baseline 28    Time 4    Period  Weeks    Target Date 09/05/21               PT Long Term Goals - 08/08/21 1720       PT LONG TERM GOAL #1   Title Patient will be independent with advanced and progressive home program for strength and endurance in order to transition to self management    Baseline  Pt does not have current formal HEP    Time 4    Period Weeks    Status New    Target Date 10/03/21      PT LONG TERM GOAL #2   Title pt's FOTO score will improve by 4 points or more indicating improved confidence with daily tasks at home    Baseline 28 at intake    Time 6    Period Weeks    Status New      PT LONG TERM GOAL #3   Title Patient will deny any falls over past 6 weeks to demonstrate improved safety awareness at home and work.    Baseline Pt reports 3 falls in previous 6 months    Time 6    Period Weeks    Status New      PT LONG TERM GOAL #4   Title Patient will perform sit to and from stand from her wheelchair with min assist and no upper extremity support in order to indicate improvement in function.    Baseline Patient requires mod assist from author and bilateral lower extremity support to transition to and from sitting    Time 12    Period Weeks    Status New    Target Date 10/31/21                   Plan - 08/26/21 1015     Clinical Impression Statement Pt able to progress in volume of STS performed compared to previous appointment, indicating improved BLE strength. Pt does require pulling through BUEs as primary technique to perform STS. Pt did attempt pushing on armrests but currently with insufficient UE elbow extensor strength. The pt will benefit from further skilled PT to improve activity tolerance, strenght, endurance and mobility to increase ease and safety with ADLs.    Personal Factors and Comorbidities Age;Comorbidity 1;Comorbidity 2;Comorbidity 3+;Time since onset of injury/illness/exacerbation    Comorbidities Depression, anxiety, HTN, MS, DDD, GERD     Examination-Activity Limitations Bed Mobility;Carry;Continence;Dressing;Hygiene/Grooming;Lift;Locomotion Level;Squat;Stairs;Stand;Toileting;Transfers    Examination-Participation Restrictions Cleaning;Community Activity;Laundry;Meal Prep;Shop    Stability/Clinical Decision Making Evolving/Moderate complexity    Rehab Potential Fair    Clinical Impairments Affecting Rehab Potential (+) family support, motivated (-) MS, decreased confidence, co morbidities    PT Frequency 2x / week    PT Duration 12 weeks    PT Treatment/Interventions Patient/family education;Gait training;Neuromuscular re-education;Therapeutic exercise;Manual techniques;ADLs/Self Care Home Management;Stair training;DME Instruction;Functional mobility training;Therapeutic activities;Balance training;Wheelchair mobility training;Energy conservation;Joint Manipulations    PT Next Visit Plan exercises for strength, endurance, private treatment room, develop HEP    PT Home Exercise Plan see patient instructions;    Consulted and Agree with Plan of Care Patient             Patient will benefit from skilled therapeutic intervention in order to improve the following deficits and impairments:  Decreased strength, Decreased balance, Decreased activity tolerance, Decreased endurance, Difficulty walking, Abnormal gait, Improper body mechanics, Decreased mobility, Hypomobility  Visit Diagnosis: Muscle weakness (generalized)  Other abnormalities of gait and mobility  Unsteadiness on feet  Other lack of coordination     Problem List Patient Active Problem List   Diagnosis Date Noted   Rash 01/27/2021   History of CVA (cerebrovascular accident) 01/05/2021   PBA (pseudobulbar affect)    MS (multiple sclerosis) (HCC)    Anxiety and depression    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   Generalized anxiety disorder 12/11/2020   Acute focal neurological  deficit 12/08/2020   Edema 09/28/2020   Bloating  08/25/2020   Environmental allergies 07/17/2019   Hyperglycemia 07/17/2019   Cough 06/06/2019   Memory change 05/29/2019   Abnormal liver function tests 01/09/2019   Hemorrhoid 09/27/2018   Nausea 08/11/2018   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   Depression with anxiety 02/04/2018   Cognitive and behavioral changes 02/04/2018   Constipation 11/17/2017   Hx of adenomatous colonic polyps 06/06/2016   Lower extremity edema 01/13/2016   Bilateral ovarian cysts 10/16/2015   Health care maintenance 10/16/2015   Colon polyp 01/25/2015   Bone/cartilage disorder 01/25/2015   Headache 11/18/2014   Depression, recurrent ([place]) 11/18/2014   Greater tuberosity of humerus fracture 11/18/2014   Multiple sclerosis ([place]) 11/18/2014   Unsteady gait 11/18/2014   Dizziness 11/18/2014   Inconclusive mammogram 03/15/2013   S/P lumbar spinal fusion 01/04/2013   Surgery, other elective 12/03/2012   DDD (degenerative disc disease), lumbosacral 10/15/2012   Back ache 09/06/2012   Hypertension 09/06/2012   Back pain 09/06/2012   Hypercholesterolemia 01/28/2012   Climacteric 01/28/2012   Routine general medical examination at a health care facility 01/28/2012   Avitaminosis D 01/28/2012    Zollie Pee, PT 08/26/2021, 10:24 AM  [place] MAIN [facility] SERVICES [place], [place], [address] Phone: [phone]   Fax:  [phone]  Name: CARRA BRINDLEY MRN: [id number] Date of Birth: November 20, 1950

## 2021-08-26 NOTE — Telephone Encounter (Signed)
I called and spoke with the patients husband and the patient and they stated she did need to hold her blood thinner for her surgery on 08/29/2021. She is scheduled for a preop appointment tomorrow to see the provider.  Carly [name],cma

## 2021-08-26 NOTE — Telephone Encounter (Signed)
Patient's husband came into office asking for a medical clearance letter from Dr Nicki Reaper for patient's cataract surgery on 08/29/2021. Shevon Sian,cma

## 2021-08-26 NOTE — Telephone Encounter (Signed)
Need to clarify with ophthalmology - are they going to require her to stop her blood thinner.  Will need to be seen for pre op evaluation.

## 2021-08-27 ENCOUNTER — Ambulatory Visit (INDEPENDENT_AMBULATORY_CARE_PROVIDER_SITE_OTHER): Payer: Medicare Other | Admitting: Internal Medicine

## 2021-08-27 ENCOUNTER — Other Ambulatory Visit: Payer: Self-pay

## 2021-08-27 ENCOUNTER — Telehealth: Payer: Self-pay | Admitting: Internal Medicine

## 2021-08-27 ENCOUNTER — Ambulatory Visit: Payer: Medicare Other

## 2021-08-27 ENCOUNTER — Encounter: Payer: Medicare Other | Admitting: Physical Medicine and Rehabilitation

## 2021-08-27 ENCOUNTER — Encounter: Payer: Self-pay | Admitting: Internal Medicine

## 2021-08-27 DIAGNOSIS — G35 Multiple sclerosis: Secondary | ICD-10-CM | POA: Diagnosis not present

## 2021-08-27 DIAGNOSIS — R600 Localized edema: Secondary | ICD-10-CM

## 2021-08-27 DIAGNOSIS — E7800 Pure hypercholesterolemia, unspecified: Secondary | ICD-10-CM | POA: Diagnosis not present

## 2021-08-27 DIAGNOSIS — Z01818 Encounter for other preprocedural examination: Secondary | ICD-10-CM | POA: Diagnosis not present

## 2021-08-27 DIAGNOSIS — Z8673 Personal history of transient ischemic attack (TIA), and cerebral infarction without residual deficits: Secondary | ICD-10-CM | POA: Diagnosis not present

## 2021-08-27 DIAGNOSIS — F339 Major depressive disorder, recurrent, unspecified: Secondary | ICD-10-CM | POA: Diagnosis not present

## 2021-08-27 DIAGNOSIS — R739 Hyperglycemia, unspecified: Secondary | ICD-10-CM | POA: Diagnosis not present

## 2021-08-27 DIAGNOSIS — K5900 Constipation, unspecified: Secondary | ICD-10-CM

## 2021-08-27 DIAGNOSIS — I639 Cerebral infarction, unspecified: Secondary | ICD-10-CM

## 2021-08-27 DIAGNOSIS — I10 Essential (primary) hypertension: Secondary | ICD-10-CM

## 2021-08-27 DIAGNOSIS — F411 Generalized anxiety disorder: Secondary | ICD-10-CM | POA: Diagnosis not present

## 2021-08-27 DIAGNOSIS — K635 Polyp of colon: Secondary | ICD-10-CM

## 2021-08-27 DIAGNOSIS — R29898 Other symptoms and signs involving the musculoskeletal system: Secondary | ICD-10-CM | POA: Diagnosis not present

## 2021-08-27 NOTE — Progress Notes (Signed)
Patient ID: Carly [name], female   DOB: [birth year], 70 y.o.   MRN: [id number]   Subjective:    Patient ID: Carly [name], female    DOB: [birth year], 70 y.o.   MRN: [id number]  This visit occurred during the SARS-CoV-2 public health emergency.  Safety protocols were in place, including screening questions prior to the visit, additional usage of staff PPE, and extensive cleaning of exam room while observing appropriate contact time as indicated for disinfecting solutions.   Patient here for pre op exam.  Chief Complaint  Patient presents with   Pre-op Exam        .   HPI Planning to have eye surgery in two days.  Has sleep apnea.  Waiting for supplies.  Followed by pulmonary.  Concern also reported increased fluid and lower extremity swelling.  Back on lasix.  No chest pain.  Breathing overall stable.  No abdominal pain.  Bowels moving and doing better.  Recent CVA.  On plavix.     Past Medical History:  Diagnosis Date   Allergy    Anxiety    Aspiration pneumonia (HCC)    Depression    Frequent headaches    H/O   GERD (gastroesophageal reflux disease)    History of chicken pox    History of colon polyps    Hx of migraines    Multiple sclerosis (HCC)    PONV (postoperative nausea and vomiting)    Past Surgical History:  Procedure Laterality Date   BACK SURGERY     CHOLECYSTECTOMY     COLONOSCOPY WITH PROPOFOL N/A 05/18/2017   Procedure: COLONOSCOPY WITH PROPOFOL;  Surgeon: [name], MD;  Location[facility] END[facility]COPY;  Service: Endoscopy;  Laterality: N/A;   FOOT SURGERY  2015   GALLBLADDER SURGERY  2008   HARDWARE REMOVAL Left 02/14/2016   Procedure: LEFT FOOT REMOVAL DEEP IMPLANT;  Surgeon: [name], MD;  Location: [place];  Service: Orthopedics;  Laterality: Left;   HERNIA REPAIR     Inguinal Hernia Repair   SPINE SURGERY  2014   Family History  Problem Relation Age of Onset   Arthritis Mother    Hypertension Mother    Macular  degeneration Mother    Hypertension Father    Hyperlipidemia Father    Heart disease Maternal Grandfather    Diabetes Maternal Grandfather    Kidney disease Paternal Grandmother    Social History   Socioeconomic History   Marital status: Married    Spouse name: Not on file   Number of children: Not on file   Years of education: Not on file   Highest education level: Not on file  Occupational History   Not on file  Tobacco Use   Smoking status: Never   Smokeless tobacco: Never  Vaping Use   Vaping Use: Never used  Substance and Sexual Activity   Alcohol use: No    Alcohol/week: 0.0 standard drinks   Drug use: No   Sexual activity: Not Currently  Other Topics Concern   Not on file  Social History Narrative   Married    Social Determinants of Health   Financial Resource Strain: Low Risk    Difficulty of Paying Living Expenses: Not hard at all  Food Insecurity: No Food Insecurity   Worried About Charity fundraiser in the Last Year: Never true   [place] in the Last Year: Never true  Transportation Needs: No Transportation Needs   Lack  of Transportation (Medical): No   Lack of Transportation (Non-Medical): No  Physical Activity: Not on file  Stress: No Stress Concern Present   Feeling of Stress : Not at all  Social Connections: Unknown   Frequency of Communication with Friends and Family: Not on file   Frequency of Social Gatherings with Friends and Family: Not on file   Attends Religious Services: Not on file   Active Member of Clubs or Organizations: Not on file   Attends Archivist Meetings: Not on file   Marital Status: Married     Review of Systems  Constitutional:  Negative for appetite change and unexpected weight change.  HENT:  Negative for congestion and sinus pressure.   Respiratory:  Negative for cough, chest tightness and shortness of breath.   Cardiovascular:  Negative for chest pain and palpitations.       Being followed by  lymphedema clinic - legs wrapped.   Gastrointestinal:  Negative for abdominal pain, diarrhea, nausea and vomiting.  Genitourinary:  Negative for difficulty urinating and dysuria.  Musculoskeletal:  Negative for joint swelling and myalgias.  Skin:  Negative for color change and rash.  Neurological:  Negative for dizziness, light-headedness and headaches.  Psychiatric/Behavioral:  Negative for agitation and dysphoric mood.       Objective:     BP 132/84   Pulse (!) 101   SpO2 97%  Wt Readings from Last 3 Encounters:  08/15/21 187 lb (84.8 kg)  06/18/21 187 lb (84.8 kg)  06/12/21 187 lb (84.8 kg)    Physical Exam Vitals reviewed.  Constitutional:      General: She is not in acute distress.    Appearance: Normal appearance.  HENT:     Head: Normocephalic and atraumatic.     Right Ear: External ear normal.     Left Ear: External ear normal.  Eyes:     General: No scleral icterus.       Right eye: No discharge.        Left eye: No discharge.     Conjunctiva/sclera: Conjunctivae normal.  Neck:     Thyroid: No thyromegaly.  Cardiovascular:     Rate and Rhythm: Normal rate and regular rhythm.  Pulmonary:     Effort: No respiratory distress.     Breath sounds: Normal breath sounds. No wheezing.  Abdominal:     General: Bowel sounds are normal.     Palpations: Abdomen is soft.     Tenderness: There is no abdominal tenderness.  Musculoskeletal:        General: No tenderness.     Cervical back: Neck supple. No tenderness.     Comments: Legs wrapped - swelling stable.   Lymphadenopathy:     Cervical: No cervical adenopathy.  Skin:    Findings: No erythema or rash.  Neurological:     Mental Status: She is alert.  Psychiatric:        Mood and Affect: Mood normal.        Behavior: Behavior normal.     Outpatient Encounter Medications as of 08/27/2021  Medication Sig   acetaminophen (TYLENOL) 500 MG tablet Take 1 tablet (500 mg total) by mouth every 4 (four) hours as  needed.   albuterol (VENTOLIN HFA) 108 (90 Base) MCG/ACT inhaler Inhale 2 puffs into the lungs every 6 (six) hours as needed.   amLODipine (NORVASC) 5 MG tablet Take 5 mg by mouth daily.   Baclofen 5 MG TABS Take 1 tablet by mouth 3 (  three) times daily.   Cholecalciferol (D3-1000 PO) Take 2,000 Units by mouth daily.   clopidogrel (PLAVIX) 75 MG tablet TAKE 1 TABLET (75 MG) BY MOUTH EVERY DAY   Coenzyme Q10 (CO Q10) 100 MG CAPS Take 1 capsule by mouth daily.   dalfampridine 10 MG TB12 Take 10 mg by mouth every 12 (twelve) hours.   docusate sodium (COLACE) 100 MG capsule Take 100 mg by mouth 2 (two) times daily.   DULoxetine (CYMBALTA) 60 MG capsule TAKE 1 CAPSULE BY MOUTH DAILY.   fexofenadine (ALLEGRA ALLERGY) 180 MG tablet Take 1 tablet (180 mg total) by mouth daily.   fluticasone (FLONASE) 50 MCG/ACT nasal spray Place 1 spray into both nostrils daily.   furosemide (LASIX) 40 MG tablet Take 1 tablet (40 mg total) by mouth daily.   ketoconazole (NIZORAL) 2 % cream SMARTSIG:1 Application Topical Morning-Night   losartan (COZAAR) 50 MG tablet TAKE 1 TABLET BY MOUTH ONCE DAILY   magnesium oxide (MAG-OX) 400 MG tablet TAKE 1 TABLET BY MOUTH DAILY   METAMUCIL FIBER PO Take by mouth 2 (two) times daily.   mirtazapine (REMERON SOL-TAB) 30 MG disintegrating tablet Take 1 tablet (30 mg total) by mouth at bedtime.   nystatin cream (MYCOSTATIN) Apply 1 application topically 2 (two) times daily.   omeprazole (PRILOSEC) 20 MG capsule Take 20 mg by mouth 2 (two) times daily before a meal.   rosuvastatin (CRESTOR) 5 MG tablet Take 1 tablet (5 mg total) by mouth daily.   saccharomyces boulardii (FLORASTOR) 250 MG capsule Take 1 capsule (250 mg total) by mouth 2 (two) times daily.   SEROQUEL 25 MG tablet Take 12.5mg  in the morning and 50mg  at night   sodium chloride (OCEAN) 0.65 % nasal spray Place 1-2 sprays into the nose as needed.   Vitamin D, Ergocalciferol, (DRISDOL) 1.25 MG (50000 UNIT) CAPS capsule  Take 1 capsule (50,000 Units total) by mouth every 7 (seven) days.   [DISCONTINUED] nystatin (MYCOSTATIN/NYSTOP) powder Apply 1 application topically 2 (two) times daily as needed.   Facility-Administered Encounter Medications as of 08/27/2021  Medication   0.9 %  sodium chloride infusion   acetaminophen (TYLENOL) tablet 650 mg     Lab Results  Component Value Date   WBC 10.6 (H) 08/15/2021   HGB 13.8 08/15/2021   HCT 41.5 08/15/2021   PLT 300.0 08/15/2021   GLUCOSE 98 08/15/2021   CHOL 190 08/15/2021   TRIG 126.0 08/15/2021   HDL 94.80 08/15/2021   LDLDIRECT 127.0 08/17/2020   LDLCALC 70 08/15/2021   ALT 18 08/15/2021   AST 16 08/15/2021   NA 143 08/15/2021   K 3.7 08/15/2021   CL 103 08/15/2021   CREATININE 0.67 08/15/2021   BUN 14 08/15/2021   CO2 29 08/15/2021   TSH 2.780 07/19/2021   HGBA1C 6.3 08/15/2021    MM 3D SCREEN BREAST BILATERAL  Result Date: 08/19/2021 CLINICAL DATA:  Screening. EXAM: DIGITAL SCREENING BILATERAL MAMMOGRAM WITH TOMOSYNTHESIS AND CAD TECHNIQUE: Bilateral screening digital craniocaudal and mediolateral oblique mammograms were obtained. Bilateral screening digital breast tomosynthesis was performed. The images were evaluated with computer-aided detection. COMPARISON:  Previous exam(s). ACR Breast Density Category c: The breast tissue is heterogeneously dense, which may obscure small masses. FINDINGS: There are no findings suspicious for malignancy. IMPRESSION: No mammographic evidence of malignancy. A result letter of this screening mammogram will be mailed directly to the patient. RECOMMENDATION: Screening mammogram in one year. (Code:SM-B-01Y) BI-RADS CATEGORY  1: Negative. Electronically Signed   By:  Lovey Newcomer M.D.   On: 08/19/2021 09:04      Assessment & Plan:   Problem List Items Addressed This Visit     Colon polyp    Discussed colonoscopy.  Last colonoscopy 05/2017.  Recommended f/u in 05/2020.  S/p recent cva so will postpone for now.   Will need in future.  Pt in agreement.        Constipation    Bowels have been doing better.  Miralax.  Follow.        Depression, recurrent ([place])    Followed by psychiatry.  On seroquel, remeron.  Follow.        Generalized anxiety disorder    Followed by psychiatry.        History of CVA (cerebrovascular accident)    Continue risk factor modification.  Appears to be tolerating statin.  Blood pressure better. Continue plavix.  Hold on surgery at this time.  Follow.         Hypercholesterolemia    Continue crestor.  Low cholesterol diet and exercise.  Follow lipid panel and liver function tests.       Hyperglycemia    Low-carb diet and exercise.  Follow metabolic panel and O8T.      Hypertension    Blood pressure as outlined.  Continue losartan and amlodipine.  Follow pressures.  Follow metabolic panel.       Lower extremity edema    PT for treatment lymphedema.  Continue.  Improved.       Multiple sclerosis ([place])    Followed by neurology.  Discussed need for strengthening exercise, etc. Continue exercises/therapy.       Pre-op evaluation    Discussed.  Relatively recent CVA.  Continue plavix.  Hold on elective procedure.  Also, given untreated sleep apnea (waiting on supplies) - would like to have more stable prior to procedure.  Continue lasix.  Follow weight and volume status.  Pt and husband in agreement to hold on eye surgery.          Einar Pheasant, MD

## 2021-08-27 NOTE — Telephone Encounter (Signed)
Pt called in regards to pt needing a referral for [facility]. She states she will be paying out of pocket however she just needs a referral placed.

## 2021-08-27 NOTE — Therapy (Signed)
[place] MAIN [facility] SERVICES [address] [place], [place], [address] Phone: [phone]   Fax:  [phone]  Occupational Therapy Treatment Note and Progress Report: Lymphedema Care  Patient Details  Reporting Period:  Name: Carly [name] MRN: [id number] Date of Birth: 1951-02-17 Referring Provider (OT): Einar Pheasant, MD  Reporting Period: 06/21/21 - 08/26/21  Encounter Date: 08/26/2021   OT End of Session - 08/26/21 1616     Visit Number 10    Number of Visits 36    Date for OT Re-Evaluation 09/11/21    Authorization Type Initial eval 06/12/21    OT Start Time 0205    OT Stop Time 0310    OT Time Calculation (min) 65 min    Equipment Utilized During Radio broadcast assistant medical [place]'; Tactile Flexitouch    Activity Tolerance Other (comment);Patient tolerated treatment well;No increased pain   limited by anxiety   Behavior During Therapy [facility] for tasks assessed/performed   Pt tearful at beginning of session, but relaxed and was more comfortable as session  progressed.            Past Medical History:  Diagnosis Date   Allergy    Anxiety    Aspiration pneumonia (HCC)    Depression    Frequent headaches    H/O   GERD (gastroesophageal reflux disease)    History of chicken pox    History of colon polyps    Hx of migraines    Multiple sclerosis (HCC)    PONV (postoperative nausea and vomiting)     Past Surgical History:  Procedure Laterality Date   BACK SURGERY     CHOLECYSTECTOMY     COLONOSCOPY WITH PROPOFOL N/A 05/18/2017   Procedure: COLONOSCOPY WITH PROPOFOL;  Surgeon: [name], MD;  Location: [facility] ENDOSCOPY;  Service: Endoscopy;  Laterality: N/A;   FOOT SURGERY  2015   GALLBLADDER SURGERY  2008   HARDWARE REMOVAL Left 02/14/2016   Procedure: LEFT FOOT REMOVAL DEEP IMPLANT;  Surgeon: [name], MD;  Location: [place];  Service: Orthopedics;  Laterality: Left;   HERNIA REPAIR      Inguinal Hernia Repair   SPINE SURGERY  2014    There were no vitals filed for this visit.   Subjective Assessment - 08/26/21 1619     Subjective  Carly [name] presents to OT for Rx visit  10/36 to addressBLE lymphedema (LE). Pt is accompanied by her husband, Carly [name]. and paid attendant. Pt denies pain n legs associated with LE. Pt continues to demonstrate steady progress towards all OT goals for LLE lymphedema.Pt has not yet ordered recommended compression garments, but she did find written recommendationsin her bag.    Patient is accompanied by: Family member    Pertinent History Relevant to limb swelling: Depression /anxiety, MS, HTN. 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 weakness,    Special Tests Intake FOTO: 29/100 (functional outcome score)    Patient Stated Goals to be able to walk better, to get this swelling down. I hate it.    Pain Onset Other (comment)   leg pain and swelling first noticed during long care trip ~ 7 hours                         OT Treatments/Exercises (OP) -  08/26/21 1620       ADLs   ADL Education Given Yes (P)                     OT Education - 08/26/21 1038     Education Details Continued Pt/ CG edu for lymphedema self care  and home program throughout session. Topics include progress towards OT goals to date, multilayer compression wrapping, simple self-MLD, therapeutic lymphatic pumping exercises, skin/nail care, LE risk reduction factors an precautions, compression garments/recommendations and wear and care schedule and compression garment donning / doffing using assistive devices. All questions answered to the Pt's satisfaction, and Pt demonstrates understanding by report.    Person(s) Educated Patient;Spouse;Caregiver(s)    Methods  Explanation;Demonstration;Handout;Verbal cues    Comprehension Verbalized understanding;Returned demonstration;Need further instruction;Verbal cues required;Tactile cues required                 OT Long Term Goals - 08/26/21 1050       OT LONG TERM GOAL #1   Title Given this patient's risk-adjustment variables, her Intake Functional Status score of 29/100 on the FOTO tool, patient will experience at least an increase in function of 3 points for a score of 32/100.    Baseline 97/100    Time 12    Period Weeks    Status On-going    Target Date 09/11/21      OT LONG TERM GOAL #2   Title Pt will be able to verbalize at least 4 lymphedema precautions and prevention strategies with Max caregiver assist to reduce infection risk limit progression of lymphedema.    Baseline dependent    Time 6    Period Days    Status On-going   In progress. Pt and spouse have short term memory issues that is delaying this goal.We continue to review each session, also with paid caregivers.     OT LONG TERM GOAL #3   Title Pt will be able to apply multi-layer, short stretch compression wraps to one leg at a time using correct gradient techniques with max assistance in an effort  to return the affected  limb(s)  to premorbid size and shape, to limit pain and infection risk, and to improve functional mobility and ambulation  for ADLs.    Baseline dependent    Time 6    Period Days    Status Achieved      OT LONG TERM GOAL #4   Title Pt will achieve at least a 10% limb volume reduction bilaterally to limit lymphedema progression, to improve tissue health and limit infection risk, and to increase tissue flexibility essential for optimal AROM and safe functional ambulation and mobility.    Baseline dependent    Time 12    Period Weeks    Status On-going   By visual assessment I estimate Pt has achieved at least a 10% limb volume reduction in the LLE below the knee. Garments have been ordered . will assess  w formal volumetrics next session.   Target Date 09/11/21      OT LONG TERM GOAL #5   Title With Max caregiver assistance Pt will achieve and sustain a least 85% compliance with all LE self-care home program components throughout Intensive Phase CDT, including frequent elevation when seated, daily skin inspection and care, lymphatic pumping ther ex, 23/7 compression wraps and simple self-MLD, to sustain clinical gains made in CDT and to limit lymphedema progression and further functional decline.    Baseline dependent  Time 12    Period Weeks    Status Achieved      OT LONG TERM GOAL #6   Title Using assistive devices (modified independence)  and Max CG assistance Pt will be able to don and doff appropriate daytime compression garments  to limit lymphatic re-accumulation and LE progression before transitioning to self-management phase of CDT.    Baseline dependent    Time 12    Period Weeks    Status On-going    Target Date 09/11/21   garments ordered 08/26/21                  Plan - 08/26/21 1617     Clinical Impression Statement RLE swelling w and skin well managed today. Spouse's application of wraps improved today, but still not correct at ankle. He required additional teaching to correct technique. Pt tolerated manual therapy, including skin care and MLD, without increased pain. Pt tolerated re-application of compression wraps as established . Assisted Pt and spouse withy garment order. Cont as per POC.    OT Occupational Profile and History Comprehensive Assessment- Review of records and extensive additional review of physical, cognitive, psychosocial history related to current functional performance    Occupational performance deficits (Please refer to evaluation for details): ADL's;IADL's;Work;Leisure;Social Participation;Other   body image   Body Structure / Function / Physical Skills ADL;Edema;Skin integrity;Pain;Decreased knowledge of precautions;Decreased knowledge of  use of DME;IADL    Rehab Potential Fair    Clinical Decision Making Multiple treatment options, significant modification of task necessary    Comorbidities Affecting Occupational Performance: Presence of comorbidities impacting occupational performance    Comorbidities impacting occupational performance description: See SUBJECTIVE for relevant Dx    Modification or Assistance to Complete Evaluation  Max significant modification of tasks or assist is necessary to complete    OT Frequency 2x / week    OT Duration 12 weeks   and PRN for follow along support and caregiver support   OT Treatment/Interventions Self-care/ADL training;Therapeutic exercise;Coping strategies training;Therapeutic activities;Manual lymph drainage;Energy conservation;Manual Therapy;Other (comment);DME and/or AE instruction;Compression bandaging;Patient/family education   skin care to limit infection risk and increase skin flexibility   Plan Comple Decongestive Therapy: Manual Lymphatic Drainage (MLD, skin care, therapeutic exercise, compression therapy using short stretch    wraps and compression garments/ devices to retain clinical gains    Recommended Other Services Consider trial with advanced sequential pneumatic compression device, or "pump". Flexitouch device recommended over all as this stimulates lymphatics proximally to distal duplicating anatomic function. Need MD clearance due to Hx of CVA and MS. Pt may not tolerate 2/2 anxiety, but will explore her interest. Consider alternative to Amlodipine due to limb swelling side effect.    Consulted and Agree with Plan of Care Family member/caregiver;Patient    Family Member Consulted Spouse, Carly [name]             Patient will benefit from skilled therapeutic intervention in order to improve the following deficits and impairments:   Body Structure / Function / Physical Skills: ADL, Edema, Skin integrity, Pain, Decreased knowledge of precautions, Decreased knowledge of use of  DME, IADL       Visit Diagnosis: Lymphedema, not elsewhere classified    Problem List Patient Active Problem List   Diagnosis Date Noted   Rash 01/27/2021   History of CVA (cerebrovascular accident) 01/05/2021   PBA (pseudobulbar affect)    MS (multiple sclerosis) ([place])    Anxiety and depression    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   Generalized anxiety disorder 12/11/2020   Acute focal neurological deficit 12/08/2020   Edema 09/28/2020   Bloating 08/25/2020   Environmental allergies 07/17/2019   Hyperglycemia 07/17/2019   Cough 06/06/2019   Memory change 05/29/2019   Abnormal liver function tests 01/09/2019   Hemorrhoid 09/27/2018   Nausea 08/11/2018   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   Depression with anxiety 02/04/2018   Cognitive and behavioral changes 02/04/2018   Constipation 11/17/2017   Hx of adenomatous colonic polyps 06/06/2016   Lower extremity edema 01/13/2016   Bilateral ovarian cysts 10/16/2015   Health care maintenance 10/16/2015   Colon polyp 01/25/2015   Bone/cartilage disorder 01/25/2015   Headache 11/18/2014   Depression, recurrent ([place]) 11/18/2014   Greater tuberosity of humerus fracture 11/18/2014   Multiple sclerosis ([place]) 11/18/2014   Unsteady gait 11/18/2014   Dizziness 11/18/2014   Inconclusive mammogram 03/15/2013   S/P lumbar spinal fusion 01/04/2013   Surgery, other elective 12/03/2012   DDD (degenerative disc disease), lumbosacral 10/15/2012   Back ache 09/06/2012   Hypertension 09/06/2012   Back pain 09/06/2012   Hypercholesterolemia 01/28/2012   Climacteric 01/28/2012   Routine general medical examination at a health care facility 01/28/2012   Avitaminosis D 01/28/2012    Andrey Spearman, MS, OTR/L, [facility] 08/27/21 10:59 AM   [place] [facility] MAIN [facility] SERVICES [place], [place], [address] Phone: [phone]   Fax:  [phone]  Name: JAHNAI SLINGERLAND MRN: [id number] Date of Birth: 1950/11/26

## 2021-08-28 ENCOUNTER — Ambulatory Visit: Payer: Medicare Other

## 2021-08-28 ENCOUNTER — Ambulatory Visit: Payer: Medicare Other | Admitting: Occupational Therapy

## 2021-08-28 ENCOUNTER — Telehealth: Payer: Self-pay | Admitting: Internal Medicine

## 2021-08-28 DIAGNOSIS — R2689 Other abnormalities of gait and mobility: Secondary | ICD-10-CM

## 2021-08-28 DIAGNOSIS — R269 Unspecified abnormalities of gait and mobility: Secondary | ICD-10-CM | POA: Diagnosis not present

## 2021-08-28 DIAGNOSIS — M6281 Muscle weakness (generalized): Secondary | ICD-10-CM | POA: Diagnosis not present

## 2021-08-28 DIAGNOSIS — I890 Lymphedema, not elsewhere classified: Secondary | ICD-10-CM

## 2021-08-28 DIAGNOSIS — R2681 Unsteadiness on feet: Secondary | ICD-10-CM

## 2021-08-28 DIAGNOSIS — R262 Difficulty in walking, not elsewhere classified: Secondary | ICD-10-CM | POA: Diagnosis not present

## 2021-08-28 NOTE — Therapy (Signed)
[place] MAIN [facility] SERVICES [address] [place], [place], [address] Phone: [phone]   Fax:  [phone]  Physical Therapy Treatment  Patient Details  Name: Carly [name] MRN: [id number] Date of Birth: [birth year] Referring Provider (PT): Einar Pheasant MD   Encounter Date: 08/28/2021   PT End of Session - 08/28/21 1336     Visit Number 6    Number of Visits 24    Date for PT Re-Evaluation 10/31/21    Authorization Time Period 08/08/21-10/31/20    Progress Note Due on Visit 10    PT Start Time 1302    PT Stop Time 1344    PT Time Calculation (min) 42 min    Equipment Utilized During Treatment Gait belt    Activity Tolerance Patient tolerated treatment well    Behavior During Therapy WFL for tasks assessed/performed             Past Medical History:  Diagnosis Date   Allergy    Anxiety    Aspiration pneumonia ([place])    Depression    Frequent headaches    H/O   GERD (gastroesophageal reflux disease)    History of chicken pox    History of colon polyps    Hx of migraines    Multiple sclerosis ([place])    PONV (postoperative nausea and vomiting)     Past Surgical History:  Procedure Laterality Date   BACK SURGERY     CHOLECYSTECTOMY     COLONOSCOPY WITH PROPOFOL N/A 05/18/2017   Procedure: COLONOSCOPY WITH PROPOFOL;  Surgeon: [name], MD;  Location: [facility] ENDOSCOPY;  Service: Endoscopy;  Laterality: N/A;   FOOT SURGERY  2015   GALLBLADDER SURGERY  2008   HARDWARE REMOVAL Left 02/14/2016   Procedure: LEFT FOOT REMOVAL DEEP IMPLANT;  Surgeon: [name], MD;  Location: [place];  Service: Orthopedics;  Laterality: Left;   HERNIA REPAIR     Inguinal Hernia Repair   SPINE SURGERY  2014    There were no vitals filed for this visit.   Subjective Assessment - 08/28/21 1300     Subjective Pt reports she is not having her surgery and has to be a year out from her stroke.  Pt reports no pain  currently. Pt reports no falls or near-falls.    Patient is accompained by: Family member    Pertinent History Pt reports she is in PT on order to allow for improvement with walking. Pt reports she has not been able to get [facility] PT because she is being seen by outpatient OT services. Pt reports she was walking  and her caregiver who helped her walk had a heart attack. Pt reports the last time she was walking regularly was about 2 years ago. She reports she gets up to [facility] with some assistance from her husband. Pt has walker, rollator and WC at home. Pt reports she used them often until her caregiver had the heart attack. Pt switched to new MS medication a couple years ago and attributes some of her dificits to the medicaiton change. Pt has significant difficulty with any activities involving standing. Pt lives in home wiht ramp to enter. House is one story with an upstairs but she lives primarilly on the first floor. Pt has shower which she can access. 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 She has weighted bracelets she has been using for arm strength. Pt reports she has not been to outpatient  PT in the past but she has had HH in the past. Pt reports swimming in past for exercise. She also reports she is moving to [facility] in January.    Limitations Standing;Walking    How long can you stand comfortably? 15 min.    How long can you walk comfortably? 10 min.    Patient Stated Goals Standing, walking, transfers    Currently in Pain? No/denies    Pain Onset Other (comment)   leg pain and swelling first noticed during long care trip ~ 7 hours             INTERVENTIONS - CGA provided throughout unless otherwise indicated   Seated ankle rockers 2x20   Ambulation length of // bars with WC and close CGA-mod a 5x length of bars; rates medium, cuing for upright posture    STS by pulling on // bars with CGA 10x, 3x.  One rep pushing through [place] for use of triceps.   Seated,  WC tricep extension with RTB x multiple reps B (added to HEP)  Seated LAQ with 3# ankle weights on BLEs - 3x10 each LE; Hands-on assist to LLE to achieve full knee extension   Seated march with 3# ankle weights on BLEs - 3x10 each LE  Addition to HEP: Access Code: [email or web address] URL: [email or web address] Date: 08/28/2021 Prepared by: Ricard Dillon  Exercises Elbow Extension with Anchored Resistance - 1 x daily - 5 x weekly - 2 sets - 20 reps    Pt educated throughout session about proper posture and technique with exercises. Improved exercise technique, movement at target joints, use of target muscles after min to mod verbal, visual, tactile cues.    PT Education - 08/28/21 1548     Education provided Yes    Education Details exercise technique, body mechanics    Person(s) Educated Patient    Methods Explanation;Demonstration;Verbal cues;Tactile cues    Comprehension Verbalized understanding;Returned demonstration;Verbal cues required;Tactile cues required;Need further instruction              PT Short Term Goals - 08/08/21 1724       PT SHORT TERM GOAL #1   Title Patient will be independent in home exercise program to improve strength/mobility for better functional independence with ADLs.    Baseline Pt does not have HEP    Time 4    Period Weeks    Status New    Target Date 09/05/21      PT SHORT TERM GOAL #2   Title Patient will increase FOTO score to equal to or greater than 4    to demonstrate statistically significant improvement in mobility and quality of life.    Baseline 28    Time 4    Period Weeks    Target Date 09/05/21               PT Long Term Goals - 08/08/21 1720       PT LONG TERM GOAL #1   Title Patient will be independent with advanced and progressive home program for strength and endurance in order to transition to self management    Baseline Pt does not have current formal HEP    Time 4    Period Weeks    Status New     Target Date 10/03/21      PT LONG TERM GOAL #2   Title pt's FOTO score will improve by 4 points or more indicating improved confidence with daily tasks at home  Baseline 28 at intake    Time 6    Period Weeks    Status New      PT LONG TERM GOAL #3   Title Patient will deny any falls over past 6 weeks to demonstrate improved safety awareness at home and work.    Baseline Pt reports 3 falls in previous 6 months    Time 6    Period Weeks    Status New      PT LONG TERM GOAL #4   Title Patient will perform sit to and from stand from her wheelchair with min assist and no upper extremity support in order to indicate improvement in function.    Baseline Patient requires mod assist from author and bilateral lower extremity support to transition to and from sitting    Time 12    Period Weeks    Status New    Target Date 10/31/21                   Plan - 08/28/21 1549     Clinical Impression Statement Pt shows progress with ambulating length of // bars 5x with up to mod a and WC follow. Pt was also able to peform multiple STS, indicating increased activity tolerance and LE strength. The pt will benefit from further skilled PT to improve strength, mobility, gait, and activity tolerance.    Personal Factors and Comorbidities Age;Comorbidity 1;Comorbidity 2;Comorbidity 3+;Time since onset of injury/illness/exacerbation    Comorbidities Depression, anxiety, HTN, MS, DDD, GERD    Examination-Activity Limitations Bed Mobility;Carry;Continence;Dressing;Hygiene/Grooming;Lift;Locomotion Level;Squat;Stairs;Stand;Toileting;Transfers    Examination-Participation Restrictions Cleaning;Community Activity;Laundry;Meal Prep;Shop    Stability/Clinical Decision Making Evolving/Moderate complexity    Rehab Potential Fair    Clinical Impairments Affecting Rehab Potential (+) family support, motivated (-) MS, decreased confidence, co morbidities    PT Frequency 2x / week    PT Duration 12 weeks     PT Treatment/Interventions Patient/family education;Gait training;Neuromuscular re-education;Therapeutic exercise;Manual techniques;ADLs/Self Care Home Management;Stair training;DME Instruction;Functional mobility training;Therapeutic activities;Balance training;Wheelchair mobility training;Energy conservation;Joint Manipulations    PT Next Visit Plan gait in // bars, strength, endurance    PT Home Exercise Plan Access Code: [email or web address]    Consulted and Agree with Plan of Care Patient             Patient will benefit from skilled therapeutic intervention in order to improve the following deficits and impairments:  Decreased strength, Decreased balance, Decreased activity tolerance, Decreased endurance, Difficulty walking, Abnormal gait, Improper body mechanics, Decreased mobility, Hypomobility  Visit Diagnosis: Muscle weakness (generalized)  Other abnormalities of gait and mobility  Unsteadiness on feet     Problem List Patient Active Problem List   Diagnosis Date Noted   Rash 01/27/2021   History of CVA (cerebrovascular accident) 01/05/2021   PBA (pseudobulbar affect)    MS (multiple sclerosis) (HCC)    Anxiety and depression    White matter periventricular infarction ([place]) 12/13/2020   Generalized anxiety disorder 12/11/2020   Acute focal neurological deficit 12/08/2020   Edema 09/28/2020   Bloating 08/25/2020   Environmental allergies 07/17/2019   Hyperglycemia 07/17/2019   Cough 06/06/2019   Memory change 05/29/2019   Abnormal liver function tests 01/09/2019   Hemorrhoid 09/27/2018   Nausea 08/11/2018   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   Depression with anxiety 02/04/2018   Cognitive and behavioral changes 02/04/2018   Constipation 11/17/2017   Hx of adenomatous colonic polyps 06/06/2016   Lower extremity edema 01/13/2016   Bilateral ovarian cysts 10/16/2015  Health care maintenance 10/16/2015   Colon polyp 01/25/2015   Bone/cartilage disorder 01/25/2015    Headache 11/18/2014   Depression, recurrent ([place]) 11/18/2014   Greater tuberosity of humerus fracture 11/18/2014   Multiple sclerosis ([place]) 11/18/2014   Unsteady gait 11/18/2014   Dizziness 11/18/2014   Inconclusive mammogram 03/15/2013   S/P lumbar spinal fusion 01/04/2013   Surgery, other elective 12/03/2012   DDD (degenerative disc disease), lumbosacral 10/15/2012   Back ache 09/06/2012   Hypertension 09/06/2012   Back pain 09/06/2012   Hypercholesterolemia 01/28/2012   Climacteric 01/28/2012   Routine general medical examination at a health care facility 01/28/2012   Avitaminosis D 01/28/2012    Zollie Pee, PT 08/28/2021, 3:56 PM  [place] MAIN [facility] SERVICES [address] [place], [place], [address] Phone: [phone]   Fax:  [phone]  Name: EMILEA GOGA MRN: [id number] Date of Birth: 04-24-1951

## 2021-08-28 NOTE — Patient Instructions (Signed)

## 2021-08-28 NOTE — Therapy (Signed)
[place] MAIN [facility] SERVICES [address] [place], [place], [address] Phone: [phone]   Fax:  [phone]  Patient Details  Name: Carly [name] MRN: [id number] Date of Birth: [birth year] Referring Provider:  Einar Pheasant, MD  Encounter Date: 08/28/2021  Andrey Spearman, MS, OTR/L, [facility] 08/28/21 3:25 PM   [place] MAIN [facility] SERVICES [address] [place], [place], [address] Phone: [phone]   Fax:  [phone]

## 2021-08-28 NOTE — Telephone Encounter (Signed)
Why does she need the referral.  Need reason. Thanks.

## 2021-08-28 NOTE — Telephone Encounter (Signed)
Pt called in requesting update on refill

## 2021-08-28 NOTE — Telephone Encounter (Signed)
LMTCB

## 2021-08-28 NOTE — Telephone Encounter (Signed)
Pt called in regards to pt needing a referral for [facility]. She states she will be paying out of pocket however she just needs a referral placed.  Cecil Vandyke,cma

## 2021-08-28 NOTE — Telephone Encounter (Signed)
Pt returning call. Pt is requesting a referral for her hand/arm. Pt stated that after her stroke her hand /arm needs therapy. Pt stated that she went to [facility] and they advise her that she need a referral. Pt would like call back

## 2021-08-29 ENCOUNTER — Ambulatory Visit: Payer: Medicare Other

## 2021-08-29 ENCOUNTER — Encounter: Payer: Medicare Other | Admitting: Occupational Therapy

## 2021-08-29 DIAGNOSIS — R29898 Other symptoms and signs involving the musculoskeletal system: Secondary | ICD-10-CM | POA: Diagnosis not present

## 2021-08-29 NOTE — Telephone Encounter (Signed)
Refill was sent to pharmacy.

## 2021-08-30 NOTE — Telephone Encounter (Signed)
Discussed with patient. Decided to hold on referral at this time. She cannot receive therapy at two different places at the same time. She is still doing therapy for lymphedema at [facility]

## 2021-08-31 ENCOUNTER — Encounter: Payer: Self-pay | Admitting: Internal Medicine

## 2021-08-31 DIAGNOSIS — Z01818 Encounter for other preprocedural examination: Secondary | ICD-10-CM | POA: Insufficient documentation

## 2021-08-31 NOTE — Assessment & Plan Note (Signed)
Bowels have been doing better.  Miralax.  Follow.

## 2021-08-31 NOTE — Assessment & Plan Note (Signed)
Continue risk factor modification.  Appears to be tolerating statin.  Blood pressure better. Continue plavix.  Hold on surgery at this time.  Follow.

## 2021-08-31 NOTE — Assessment & Plan Note (Signed)
Followed by psychiatry.  On seroquel, remeron.  Follow.

## 2021-08-31 NOTE — Assessment & Plan Note (Signed)
Followed by neurology.  Discussed need for strengthening exercise, etc. Continue exercises/therapy.

## 2021-08-31 NOTE — Assessment & Plan Note (Signed)
Followed by psychiatry 

## 2021-08-31 NOTE — Assessment & Plan Note (Signed)
Blood pressure as outlined.  Continue losartan and amlodipine.  Follow pressures.  Follow metabolic panel.   

## 2021-08-31 NOTE — Assessment & Plan Note (Signed)
Discussed colonoscopy.  Last colonoscopy 05/2017.  Recommended f/u in 05/2020.  S/p recent cva so will postpone for now.  Will need in future.  Pt in agreement.

## 2021-08-31 NOTE — Assessment & Plan Note (Signed)
Low-carb diet and exercise.  Follow metabolic panel and A1c. 

## 2021-08-31 NOTE — Assessment & Plan Note (Signed)
PT for treatment lymphedema.  Continue.  Improved.

## 2021-08-31 NOTE — Assessment & Plan Note (Signed)
Discussed.  Relatively recent CVA.  Continue plavix.  Hold on elective procedure.  Also, given untreated sleep apnea (waiting on supplies) - would like to have more stable prior to procedure.  Continue lasix.  Follow weight and volume status.  Pt and husband in agreement to hold on eye surgery.

## 2021-08-31 NOTE — Assessment & Plan Note (Signed)
Continue crestor.  Low cholesterol diet and exercise. Follow lipid panel and liver function tests.   

## 2021-09-02 ENCOUNTER — Other Ambulatory Visit: Payer: Self-pay

## 2021-09-02 ENCOUNTER — Ambulatory Visit: Payer: Medicare Other | Admitting: Occupational Therapy

## 2021-09-02 DIAGNOSIS — R262 Difficulty in walking, not elsewhere classified: Secondary | ICD-10-CM | POA: Diagnosis not present

## 2021-09-02 DIAGNOSIS — I890 Lymphedema, not elsewhere classified: Secondary | ICD-10-CM

## 2021-09-02 DIAGNOSIS — R2689 Other abnormalities of gait and mobility: Secondary | ICD-10-CM | POA: Diagnosis not present

## 2021-09-02 DIAGNOSIS — R269 Unspecified abnormalities of gait and mobility: Secondary | ICD-10-CM | POA: Diagnosis not present

## 2021-09-02 DIAGNOSIS — M6281 Muscle weakness (generalized): Secondary | ICD-10-CM | POA: Diagnosis not present

## 2021-09-02 DIAGNOSIS — R2681 Unsteadiness on feet: Secondary | ICD-10-CM | POA: Diagnosis not present

## 2021-09-02 NOTE — Therapy (Signed)
[place] MAIN [facility] SERVICES [address] [place], [place], [address] Phone: [phone]   Fax:  [phone]  Occupational Therapy Treatment  Patient Details  Name: Carly [name] MRN: [id number] Date of Birth: Sep 10, 1951 Referring Provider (OT): Einar Pheasant, MD   Encounter Date: 09/02/2021   OT End of Session - 09/02/21 1117     Visit Number 12    Number of Visits 36    Date for OT Re-Evaluation 09/11/21    Authorization Type Initial eval 06/12/21    OT Start Time 1110    OT Stop Time 1215    OT Time Calculation (min) 65 min    Equipment Utilized During Treatment --    Activity Tolerance Other (comment);Patient tolerated treatment well;No increased pain   limited by anxiety   Behavior During Therapy [facility] for tasks assessed/performed   labile            Past Medical History:  Diagnosis Date   Allergy    Anxiety    Aspiration pneumonia (HCC)    Depression    Frequent headaches    H/O   GERD (gastroesophageal reflux disease)    History of chicken pox    History of colon polyps    Hx of migraines    Multiple sclerosis (HCC)    PONV (postoperative nausea and vomiting)     Past Surgical History:  Procedure Laterality Date   BACK SURGERY     CHOLECYSTECTOMY     COLONOSCOPY WITH PROPOFOL N/A 05/18/2017   Procedure: COLONOSCOPY WITH PROPOFOL;  Surgeon: [name], MD;  Location: [facility] ENDOSCOPY;  Service: Endoscopy;  Laterality: N/A;   FOOT SURGERY  2015   GALLBLADDER SURGERY  2008   HARDWARE REMOVAL Left 02/14/2016   Procedure: LEFT FOOT REMOVAL DEEP IMPLANT;  Surgeon: [name], MD;  Location: [place];  Service: Orthopedics;  Laterality: Left;   HERNIA REPAIR     Inguinal Hernia Repair   SPINE SURGERY  2014    There were no vitals filed for this visit.   Subjective Assessment - 09/02/21 1119     Subjective  Carly [name] presents to OT for Rx visit  12/36 to addressBLE lymphedema  (LE). Pt is accompanmnied by a paid attendant. Pt denies pain n legs associated with LE. Pt states recommended compression stockings have not yet arrived.    Patient is accompanied by: Family member    Pertinent History Relevant to limb swelling: Depression /anxiety, MS, HTN. 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 weakness,    Special Tests Intake FOTO: 29/100 (functional outcome score)    Patient Stated Goals to be able to walk better, to get this swelling down. I hate it.    Pain Onset Other (comment)   leg pain and swelling first noticed during long care trip ~ 7 hours                                 OT Education - 09/02/21 1243     Education Details Continued Pt/ CG edu for lymphedema self care  and home program throughout session. Topics include progress towards OT goals to date, multilayer compression wrapping, simple self-MLD, therapeutic lymphatic pumping exercises, skin/nail care, LE risk reduction factors an precautions,  compression garments/recommendations and wear and care schedule and compression garment donning / doffing using assistive devices. All questions answered to the Pt's satisfaction, and Pt demonstrates understanding by report.    Person(s) Educated Patient;Spouse;Caregiver(s)    Methods Explanation;Demonstration;Handout;Verbal cues    Comprehension Verbalized understanding;Returned demonstration;Need further instruction;Verbal cues required;Tactile cues required                 OT Long Term Goals - 08/28/21 1523       OT LONG TERM GOAL #1   Title Given this patient's risk-adjustment variables, her Intake Functional Status score of 29/100 on the FOTO tool, patient will experience at least an increase in function of 3 points for a score of 32/100.    Baseline  97/100    Time 12    Period Weeks    Status On-going    Target Date 09/11/21      OT LONG TERM GOAL #2   Title Pt will be able to verbalize at least 4 lymphedema precautions and prevention strategies with Max caregiver assist to reduce infection risk limit progression of lymphedema.    Baseline dependent    Time 6    Period Days    Status On-going   In progress. Pt and spouse have short term memory issues that is delaying this goal.We continue to review each session, also with paid caregivers.   Target Date 09/11/21      OT LONG TERM GOAL #3   Title Pt will be able to apply multi-layer, short stretch compression wraps to one leg at a time using correct gradient techniques with max assistance in an effort  to return the affected  limb(s)  to premorbid size and shape, to limit pain and infection risk, and to improve functional mobility and ambulation  for ADLs.    Baseline dependent    Time 6    Period Days    Status Achieved      OT LONG TERM GOAL #4   Title Pt will achieve at least a 10% limb volume reduction bilaterally to limit lymphedema progression, to improve tissue health and limit infection risk, and to increase tissue flexibility essential for optimal AROM and safe functional ambulation and mobility.    Baseline dependent    Time 12    Period Weeks    Status Partially Met   By visual assessment I estimate Pt has achieved at least a 10% limb volume reduction in the LLE below the knee. Garments have been ordered . will assess w formal volumetrics next session.   Target Date 09/11/21   Met for LLE with 11% reduction measured 11th visit on 08/28/21.     OT LONG TERM GOAL #5   Title With Max caregiver assistance Pt will achieve and sustain a least 85% compliance with all LE self-care home program components throughout Intensive Phase CDT, including frequent elevation when seated, daily skin inspection and care, lymphatic pumping ther ex, 23/7 compression wraps and simple self-MLD, to  sustain clinical gains made in CDT and to limit lymphedema progression and further functional decline.    Baseline dependent    Time 12    Period Weeks    Status Achieved      OT LONG TERM GOAL #6   Title Using assistive devices (modified independence)  and Max CG assistance Pt will be able to don and doff appropriate daytime compression garments  to limit lymphatic re-accumulation and LE progression before transitioning to self-management phase of CDT.  Baseline dependent    Time 12    Period Weeks    Status On-going    Target Date 09/11/21                   Plan - 09/02/21 1244     Clinical Impression Statement LLE compression wraps left in place as we opted to provide manual therapy to RLE, which is, to date, untreated. Pt expressed concern and anxiety about progression of MS, decline in mobility over the last year, recent dx for OSA  and lymphedema. We discussed mindfulness techniques for relaxation, including deep breathing and recognizing when anxiety is escalating. Pt is tolerating LE rx very well and continues to demonstrate progress toewards all goals. Pt appears to get secondary gain from manual lymphatic drainage in terms of therapeutic touch. Garments  were delivered to wrong address. Pt reordered. Cont as per POC.    OT Occupational Profile and History Comprehensive Assessment- Review of records and extensive additional review of physical, cognitive, psychosocial history related to current functional performance    Occupational performance deficits (Please refer to evaluation for details): ADL's;IADL's;Work;Leisure;Social Participation;Other   body image   Body Structure / Function / Physical Skills ADL;Edema;Skin integrity;Pain;Decreased knowledge of precautions;Decreased knowledge of use of DME;IADL    Rehab Potential Fair    Clinical Decision Making Multiple treatment options, significant modification of task necessary    Comorbidities Affecting Occupational  Performance: Presence of comorbidities impacting occupational performance    Comorbidities impacting occupational performance description: See SUBJECTIVE for relevant Dx    Modification or Assistance to Complete Evaluation  Max significant modification of tasks or assist is necessary to complete    OT Frequency 2x / week    OT Duration 12 weeks   and PRN for follow along support and caregiver support   OT Treatment/Interventions Self-care/ADL training;Therapeutic exercise;Coping strategies training;Therapeutic activities;Manual lymph drainage;Energy conservation;Manual Therapy;Other (comment);DME and/or AE instruction;Compression bandaging;Patient/family education   skin care to limit infection risk and increase skin flexibility   Plan Comple Decongestive Therapy: Manual Lymphatic Drainage (MLD, skin care, therapeutic exercise, compression therapy using short stretch    wraps and compression garments/ devices to retain clinical gains    Recommended Other Services Consider trial with advanced sequential pneumatic compression device, or "pump". Flexitouch device recommended over all as this stimulates lymphatics proximally to distal duplicating anatomic function. Need MD clearance due to Hx of CVA and MS. Pt may not tolerate 2/2 anxiety, but will explore her interest. Consider alternative to Amlodipine due to limb swelling side effect.    Consulted and Agree with Plan of Care Family member/caregiver;Patient    Family Member Consulted Spouse, Bill             Patient will benefit from skilled therapeutic intervention in order to improve the following deficits and impairments:   Body Structure / Function / Physical Skills: ADL, Edema, Skin integrity, Pain, Decreased knowledge of precautions, Decreased knowledge of use of DME, IADL       Visit Diagnosis: Lymphedema, not elsewhere classified    Problem List Patient Active Problem List   Diagnosis Date Noted   Pre-op evaluation 08/31/2021    Rash 01/27/2021   History of CVA (cerebrovascular accident) 01/05/2021   PBA (pseudobulbar affect)    MS (multiple sclerosis) ([place])    Anxiety and depression    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   Generalized anxiety disorder 12/11/2020   Acute focal neurological deficit 12/08/2020   Edema 09/28/2020   Bloating  08/25/2020   Environmental allergies 07/17/2019   Hyperglycemia 07/17/2019   Cough 06/06/2019   Memory change 05/29/2019   Abnormal liver function tests 01/09/2019   Hemorrhoid 09/27/2018   Nausea 08/11/2018   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   Depression with anxiety 02/04/2018   Cognitive and behavioral changes 02/04/2018   Constipation 11/17/2017   Hx of adenomatous colonic polyps 06/06/2016   Lower extremity edema 01/13/2016   Bilateral ovarian cysts 10/16/2015   Health care maintenance 10/16/2015   Colon polyp 01/25/2015   Bone/cartilage disorder 01/25/2015   Headache 11/18/2014   Depression, recurrent ([place]) 11/18/2014   Greater tuberosity of humerus fracture 11/18/2014   Multiple sclerosis ([place]) 11/18/2014   Unsteady gait 11/18/2014   Dizziness 11/18/2014   Inconclusive mammogram 03/15/2013   S/P lumbar spinal fusion 01/04/2013   Surgery, other elective 12/03/2012   DDD (degenerative disc disease), lumbosacral 10/15/2012   Back ache 09/06/2012   Hypertension 09/06/2012   Back pain 09/06/2012   Hypercholesterolemia 01/28/2012   Climacteric 01/28/2012   Routine general medical examination at a health care facility 01/28/2012   Avitaminosis D 01/28/2012    Andrey Spearman, MS, OTR/L, [facility] 09/02/21 12:59 PM    [place] MAIN [facility] SERVICES [address] [place], [place], [address] Phone: [phone]   Fax:  [phone]  Name: Carly [name] MRN: [id number] Date of Birth: 1951-09-21

## 2021-09-02 NOTE — Patient Instructions (Signed)

## 2021-09-03 ENCOUNTER — Ambulatory Visit: Payer: Medicare Other

## 2021-09-03 DIAGNOSIS — R2681 Unsteadiness on feet: Secondary | ICD-10-CM | POA: Diagnosis not present

## 2021-09-03 DIAGNOSIS — R29898 Other symptoms and signs involving the musculoskeletal system: Secondary | ICD-10-CM | POA: Diagnosis not present

## 2021-09-03 DIAGNOSIS — R2689 Other abnormalities of gait and mobility: Secondary | ICD-10-CM

## 2021-09-03 DIAGNOSIS — I890 Lymphedema, not elsewhere classified: Secondary | ICD-10-CM | POA: Diagnosis not present

## 2021-09-03 DIAGNOSIS — M6281 Muscle weakness (generalized): Secondary | ICD-10-CM | POA: Diagnosis not present

## 2021-09-03 DIAGNOSIS — R269 Unspecified abnormalities of gait and mobility: Secondary | ICD-10-CM | POA: Diagnosis not present

## 2021-09-03 DIAGNOSIS — R262 Difficulty in walking, not elsewhere classified: Secondary | ICD-10-CM | POA: Diagnosis not present

## 2021-09-03 NOTE — Therapy (Signed)
[place] MAIN [facility] SERVICES [address] [place], [place], [address] Phone: [phone]   Fax:  [phone]  Physical Therapy Treatment  Patient Details  Name: Carly [name] MRN: [id number] Date of Birth: [birth year] Referring Provider (PT): Einar Pheasant MD   Encounter Date: 09/03/2021   PT End of Session - 09/03/21 1522     Visit Number 7    Number of Visits 24    Date for PT Re-Evaluation 10/31/21    Authorization Time Period 08/08/21-10/31/20    Progress Note Due on Visit 10    PT Start Time 1345    PT Stop Time 1429    PT Time Calculation (min) 44 min    Equipment Utilized During Treatment Gait belt    Activity Tolerance Patient tolerated treatment well    Behavior During Therapy WFL for tasks assessed/performed             Past Medical History:  Diagnosis Date   Allergy    Anxiety    Aspiration pneumonia ([place])    Depression    Frequent headaches    H/O   GERD (gastroesophageal reflux disease)    History of chicken pox    History of colon polyps    Hx of migraines    Multiple sclerosis ([place])    PONV (postoperative nausea and vomiting)     Past Surgical History:  Procedure Laterality Date   BACK SURGERY     CHOLECYSTECTOMY     COLONOSCOPY WITH PROPOFOL N/A 05/18/2017   Procedure: COLONOSCOPY WITH PROPOFOL;  Surgeon: [name], MD;  Location: [facility] ENDOSCOPY;  Service: Endoscopy;  Laterality: N/A;   FOOT SURGERY  2015   GALLBLADDER SURGERY  2008   HARDWARE REMOVAL Left 02/14/2016   Procedure: LEFT FOOT REMOVAL DEEP IMPLANT;  Surgeon: [name], MD;  Location: [place];  Service: Orthopedics;  Laterality: Left;   HERNIA REPAIR     Inguinal Hernia Repair   SPINE SURGERY  2014    There were no vitals filed for this visit.   Subjective Assessment - 09/03/21 1507     Subjective Patient presents from hand therapy. Reports she is fatigued.    Patient is accompained by: Family member     Pertinent History Pt reports she is in PT on order to allow for improvement with walking. Pt reports she has not been able to get [facility] PT because she is being seen by outpatient OT services. Pt reports she was walking  and her caregiver who helped her walk had a heart attack. Pt reports the last time she was walking regularly was about 2 years ago. She reports she gets up to [facility] with some assistance from her husband. Pt has walker, rollator and WC at home. Pt reports she used them often until her caregiver had the heart attack. Pt switched to new MS medication a couple years ago and attributes some of her dificits to the medicaiton change. Pt has significant difficulty with any activities involving standing. Pt lives in home wiht ramp to enter. House is one story with an upstairs but she lives primarilly on the first floor. Pt has shower which she can access. 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 She has weighted bracelets she has been using for arm strength. Pt reports she has not been to outpatient PT in the past but she has had [facility] in the past. Pt reports swimming in past for exercise. She also  reports she is moving to [facility] in January.    Limitations Standing;Walking    How long can you stand comfortably? 15 min.    How long can you walk comfortably? 10 min.    Patient Stated Goals Standing, walking, transfers    Currently in Pain? No/denies    Pain Onset --   leg pain and swelling first noticed during long care trip ~ 7 hours                 Standing in // bars:  ambulate 3x length of // bars with rest breaks after each length; cue for upright posture with wheelchair follow, verbal cues for weight shift and lift Sit to stand 5x with SUE on rail SUE on wheelchair Standing weight shift 2x 30 seconds with focus on hip shifts Standing weight shift with foot clearance 12x each LE Standing hip flexion march into RTB across // bars     seated:  RTB  hamstring curl 15x each LE RTB alternating LAQ 12x each LE RTB ER 12x with max cueing for body mechanics.   Pt educated throughout session about proper posture and technique with exercises. Improved exercise technique, movement at target joints, use of target muscles after min to mod verbal, visual, tactile cues.      Patient tolerates increased standing interventions with rest breaks after each intervention. She remains highly motivated despite her fatigue. Her caregiver is eager to participate and assists with wheelchair follows and carryover to home programing including standing weight shifts. Patient needs a new seating/back rest support in manual chair due to improper body mechanics, discussed with patient and caregiver, will request one from physician. The pt will benefit from further skilled PT to improve strength, mobility, gait, and activity tolerance.            PT Education - 09/03/21 1521     Education provided Yes    Education Details exercise technique, body mechanics, caregiving assistance    Person(s) Educated Patient    Methods Explanation;Demonstration;Tactile cues;Verbal cues    Comprehension Verbalized understanding;Returned demonstration;Verbal cues required;Tactile cues required              PT Short Term Goals - 08/08/21 1724       PT SHORT TERM GOAL #1   Title Patient will be independent in home exercise program to improve strength/mobility for better functional independence with ADLs.    Baseline Pt does not have HEP    Time 4    Period Weeks    Status New    Target Date 09/05/21      PT SHORT TERM GOAL #2   Title Patient will increase FOTO score to equal to or greater than 4    to demonstrate statistically significant improvement in mobility and quality of life.    Baseline 28    Time 4    Period Weeks    Target Date 09/05/21               PT Long Term Goals - 08/08/21 1720       PT LONG TERM GOAL #1   Title Patient will be  independent with advanced and progressive home program for strength and endurance in order to transition to self management    Baseline Pt does not have current formal HEP    Time 4    Period Weeks    Status New    Target Date 10/03/21      PT LONG  TERM GOAL #2   Title pt's FOTO score will improve by 4 points or more indicating improved confidence with daily tasks at home    Baseline 28 at intake    Time 6    Period Weeks    Status New      PT LONG TERM GOAL #3   Title Patient will deny any falls over past 6 weeks to demonstrate improved safety awareness at home and work.    Baseline Pt reports 3 falls in previous 6 months    Time 6    Period Weeks    Status New      PT LONG TERM GOAL #4   Title Patient will perform sit to and from stand from her wheelchair with min assist and no upper extremity support in order to indicate improvement in function.    Baseline Patient requires mod assist from author and bilateral lower extremity support to transition to and from sitting    Time 12    Period Weeks    Status New    Target Date 10/31/21                   Plan - 09/03/21 1525     Clinical Impression Statement Patient tolerates increased standing interventions with rest breaks after each intervention. She remains highly motivated despite her fatigue. Her caregiver is eager to participate and assists with wheelchair follows and carryover to home programing including standing weight shifts. Patient needs a new seating/back rest support in manual chair due to improper body mechanics, discussed with patient and caregiver, will request one from physician. The pt will benefit from further skilled PT to improve strength, mobility, gait, and activity tolerance.    Personal Factors and Comorbidities Age;Comorbidity 1;Comorbidity 2;Comorbidity 3+;Time since onset of injury/illness/exacerbation    Comorbidities Depression, anxiety, HTN, MS, DDD, GERD    Examination-Activity Limitations  Bed Mobility;Carry;Continence;Dressing;Hygiene/Grooming;Lift;Locomotion Level;Squat;Stairs;Stand;Toileting;Transfers    Examination-Participation Restrictions Cleaning;Community Activity;Laundry;Meal Prep;Shop    Stability/Clinical Decision Making Evolving/Moderate complexity    Rehab Potential Fair    Clinical Impairments Affecting Rehab Potential (+) family support, motivated (-) MS, decreased confidence, co morbidities    PT Frequency 2x / week    PT Duration 12 weeks    PT Treatment/Interventions Patient/family education;Gait training;Neuromuscular re-education;Therapeutic exercise;Manual techniques;ADLs/Self Care Home Management;Stair training;DME Instruction;Functional mobility training;Therapeutic activities;Balance training;Wheelchair mobility training;Energy conservation;Joint Manipulations    PT Next Visit Plan gait in // bars, strength, endurance    PT Home Exercise Plan Access Code: [email or web address]    Consulted and Agree with Plan of Care Patient             Patient will benefit from skilled therapeutic intervention in order to improve the following deficits and impairments:  Decreased strength, Decreased balance, Decreased activity tolerance, Decreased endurance, Difficulty walking, Abnormal gait, Improper body mechanics, Decreased mobility, Hypomobility  Visit Diagnosis: Muscle weakness (generalized)  Other abnormalities of gait and mobility  Unsteadiness on feet     Problem List Patient Active Problem List   Diagnosis Date Noted   Pre-op evaluation 08/31/2021   Rash 01/27/2021   History of CVA (cerebrovascular accident) 01/05/2021   PBA (pseudobulbar affect)    MS (multiple sclerosis) (HCC)    Anxiety and depression    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   Generalized anxiety disorder 12/11/2020   Acute focal neurological deficit 12/08/2020   Edema 09/28/2020   Bloating 08/25/2020   Environmental allergies 07/17/2019   Hyperglycemia 07/17/2019    Cough 06/06/2019  Memory change 05/29/2019   Abnormal liver function tests 01/09/2019   Hemorrhoid 09/27/2018   Nausea 08/11/2018   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   Depression with anxiety 02/04/2018   Cognitive and behavioral changes 02/04/2018   Constipation 11/17/2017   Hx of adenomatous colonic polyps 06/06/2016   Lower extremity edema 01/13/2016   Bilateral ovarian cysts 10/16/2015   Health care maintenance 10/16/2015   Colon polyp 01/25/2015   Bone/cartilage disorder 01/25/2015   Headache 11/18/2014   Depression, recurrent ([place]) 11/18/2014   Greater tuberosity of humerus fracture 11/18/2014   Multiple sclerosis ([place]) 11/18/2014   Unsteady gait 11/18/2014   Dizziness 11/18/2014   Inconclusive mammogram 03/15/2013   S/P lumbar spinal fusion 01/04/2013   Surgery, other elective 12/03/2012   DDD (degenerative disc disease), lumbosacral 10/15/2012   Back ache 09/06/2012   Hypertension 09/06/2012   Back pain 09/06/2012   Hypercholesterolemia 01/28/2012   Climacteric 01/28/2012   Routine general medical examination at a health care facility 01/28/2012   Avitaminosis D 01/28/2012    Janna Arch, PT, DPT  09/03/2021, 3:26 PM  [place] MAIN [facility] SERVICES [address] [place], [place], [address] Phone: [phone]   Fax:  [phone]  Name: QUENTINA FRONEK MRN: [id number] Date of Birth: 22-Jun-1951

## 2021-09-09 DIAGNOSIS — M79601 Pain in right arm: Secondary | ICD-10-CM | POA: Diagnosis not present

## 2021-09-09 DIAGNOSIS — M5413 Radiculopathy, cervicothoracic region: Secondary | ICD-10-CM | POA: Diagnosis not present

## 2021-09-09 DIAGNOSIS — M9900 Segmental and somatic dysfunction of head region: Secondary | ICD-10-CM | POA: Diagnosis not present

## 2021-09-09 DIAGNOSIS — M542 Cervicalgia: Secondary | ICD-10-CM | POA: Diagnosis not present

## 2021-09-09 DIAGNOSIS — R519 Headache, unspecified: Secondary | ICD-10-CM | POA: Diagnosis not present

## 2021-09-09 DIAGNOSIS — M7918 Myalgia, other site: Secondary | ICD-10-CM | POA: Diagnosis not present

## 2021-09-09 DIAGNOSIS — M79602 Pain in left arm: Secondary | ICD-10-CM | POA: Diagnosis not present

## 2021-09-09 DIAGNOSIS — M5412 Radiculopathy, cervical region: Secondary | ICD-10-CM | POA: Diagnosis not present

## 2021-09-09 DIAGNOSIS — M9901 Segmental and somatic dysfunction of cervical region: Secondary | ICD-10-CM | POA: Diagnosis not present

## 2021-09-10 ENCOUNTER — Ambulatory Visit: Payer: Medicare Other | Admitting: Physical Medicine and Rehabilitation

## 2021-09-10 DIAGNOSIS — R29898 Other symptoms and signs involving the musculoskeletal system: Secondary | ICD-10-CM | POA: Diagnosis not present

## 2021-09-11 ENCOUNTER — Ambulatory Visit: Payer: Medicare Other | Admitting: Occupational Therapy

## 2021-09-11 DIAGNOSIS — G35 Multiple sclerosis: Secondary | ICD-10-CM | POA: Diagnosis not present

## 2021-09-12 ENCOUNTER — Ambulatory Visit: Payer: Medicare Other | Admitting: Occupational Therapy

## 2021-09-12 ENCOUNTER — Ambulatory Visit: Payer: Medicare Other | Attending: Internal Medicine

## 2021-09-12 ENCOUNTER — Other Ambulatory Visit: Payer: Self-pay

## 2021-09-12 DIAGNOSIS — R278 Other lack of coordination: Secondary | ICD-10-CM | POA: Insufficient documentation

## 2021-09-12 DIAGNOSIS — R2681 Unsteadiness on feet: Secondary | ICD-10-CM | POA: Diagnosis not present

## 2021-09-12 DIAGNOSIS — R262 Difficulty in walking, not elsewhere classified: Secondary | ICD-10-CM

## 2021-09-12 DIAGNOSIS — R2689 Other abnormalities of gait and mobility: Secondary | ICD-10-CM | POA: Insufficient documentation

## 2021-09-12 DIAGNOSIS — I890 Lymphedema, not elsewhere classified: Secondary | ICD-10-CM

## 2021-09-12 DIAGNOSIS — M6281 Muscle weakness (generalized): Secondary | ICD-10-CM | POA: Diagnosis not present

## 2021-09-12 DIAGNOSIS — R269 Unspecified abnormalities of gait and mobility: Secondary | ICD-10-CM | POA: Diagnosis not present

## 2021-09-12 NOTE — Therapy (Signed)
[place] MAIN [facility] SERVICES [address] [place], [place], [address] Phone: [phone]   Fax:  [phone]  Physical Therapy Treatment  Patient Details  Name: Carly [name] MRN: [id number] Date of Birth: 11/30/1950 Referring Provider (PT): Einar Pheasant MD   Encounter Date: 09/12/2021   PT End of Session - 09/12/21 2231     Visit Number 8    Number of Visits 24    Date for PT Re-Evaluation 10/31/21    Authorization Time Period 08/08/21-10/31/20    Progress Note Due on Visit 10    PT Start Time 1431    PT Stop Time 1509    PT Time Calculation (min) 38 min    Equipment Utilized During Treatment Gait belt    Activity Tolerance Patient tolerated treatment well    Behavior During Therapy WFL for tasks assessed/performed             Past Medical History:  Diagnosis Date   Allergy    Anxiety    Aspiration pneumonia ([place])    Depression    Frequent headaches    H/O   GERD (gastroesophageal reflux disease)    History of chicken pox    History of colon polyps    Hx of migraines    Multiple sclerosis ([place])    PONV (postoperative nausea and vomiting)     Past Surgical History:  Procedure Laterality Date   BACK SURGERY     CHOLECYSTECTOMY     COLONOSCOPY WITH PROPOFOL N/A 05/18/2017   Procedure: COLONOSCOPY WITH PROPOFOL;  Surgeon: [name], MD;  Location: [facility] END[facility]COPY;  Service: Endoscopy;  Laterality: N/A;   FOOT SURGERY  2015   GALLBLADDER SURGERY  2008   HARDWARE REMOVAL Left 02/14/2016   Procedure: LEFT FOOT REMOVAL DEEP IMPLANT;  Surgeon: [name], MD;  Location: [place];  Service: Orthopedics;  Laterality: Left;   HERNIA REPAIR     Inguinal Hernia Repair   SPINE SURGERY  2014    There were no vitals filed for this visit.   Subjective Assessment - 09/12/21 1436     Subjective Patient reports that she was diagnosed with sleep apnea and going to get a CPAP tomorrow. She denies any  falls and no pain.    Patient is accompained by: Family member    Pertinent History Pt reports she is in PT on order to allow for improvement with walking. Pt reports she has not been able to get [facility] PT because she is being seen by outpatient OT services. Pt reports she was walking  and her caregiver who helped her walk had a heart attack. Pt reports the last time she was walking regularly was about 2 years ago. She reports she gets up to [facility] with some assistance from her husband. Pt has walker, rollator and WC at home. Pt reports she used them often until her caregiver had the heart attack. Pt switched to new MS medication a couple years ago and attributes some of her dificits to the medicaiton change. Pt has significant difficulty with any activities involving standing. Pt lives in home wiht ramp to enter. House is one story with an upstairs but she lives primarilly on the first floor. Pt has shower which she can access. 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 She has weighted bracelets she has been using for arm strength. Pt reports she has not been to outpatient PT in the past but she has  had HH in the past. Pt reports swimming in past for exercise. She also reports she is moving to [employer] [facility] community in January.    Limitations Standing;Walking    How long can you stand comfortably? 15 min.    How long can you walk comfortably? 10 min.    Patient Stated Goals Standing, walking, transfers    Currently in Pain? No/denies    Pain Onset --   leg pain and swelling first noticed during long care trip ~ 7 hours             INTERVENTIONS:    seated:  Hip march 15x each LE Alternating LAQ 12x each LE- Cues for eccentric control Toe raises x 15 BLE Heel raises x 15 BLE- Patient with minimal height on left LE Standing lateral weight shift  x 10 with focus on hip shifts       Gait training:  ambulate 1x length of // bars with CGA; cues for upright posture with wheelchair  follow, min UE support on bars and short reciprocal steps with VC so progressed today to using a RW.  Gait with RW- approx 50 feet with 1 seated rest break and close w/c follow, CGA with VC for gait sequencing including increasing step length and erect posture. Patient was excited to be walking and improved her confidence with practice.     Pt educated throughout session about proper posture and technique with exercises. Improved exercise technique, movement at target joints, use of target muscles after min to mod verbal, visual, tactile cues.  *Treatment ended early due to patient had appointment for lymphedema.   Patient presented with excellent motivation and responded well to seated LE strengthening exercises. [place]- stated she could help her be consistent with all exercises performed today. She then ambulated in //bar with min UE support so transitioned to use of walker. She was fatigued after walking but able to follow all VC for safe technique and patient very pleased to be walking today. The pt will benefit from further skilled PT to improve strength, mobility, gait, and activity tolerance.                     PT Education - 09/12/21 2230     Education Details Exercise technique; gait safety cues.    Person(s) Educated Patient    Methods Explanation;Demonstration;Tactile cues;Verbal cues    Comprehension Verbalized understanding;Returned demonstration;Verbal cues required;Tactile cues required;Need further instruction              PT Short Term Goals - 08/08/21 1724       PT SHORT TERM GOAL #1   Title Patient will be independent in home exercise program to improve strength/mobility for better functional independence with ADLs.    Baseline Pt does not have HEP    Time 4    Period Weeks    Status New    Target Date 09/05/21      PT SHORT TERM GOAL #2   Title Patient will increase FOTO score to equal to or greater than 4    to demonstrate  statistically significant improvement in mobility and quality of life.    Baseline 28    Time 4    Period Weeks    Target Date 09/05/21               PT Long Term Goals - 08/08/21 1720       PT LONG TERM GOAL #1   Title Patient  will be independent with advanced and progressive home program for strength and endurance in order to transition to self management    Baseline Pt does not have current formal HEP    Time 4    Period Weeks    Status New    Target Date 10/03/21      PT LONG TERM GOAL #2   Title pt's FOTO score will improve by 4 points or more indicating improved confidence with daily tasks at home    Baseline 28 at intake    Time 6    Period Weeks    Status New      PT LONG TERM GOAL #3   Title Patient will deny any falls over past 6 weeks to demonstrate improved safety awareness at home and work.    Baseline Pt reports 3 falls in previous 6 months    Time 6    Period Weeks    Status New      PT LONG TERM GOAL #4   Title Patient will perform sit to and from stand from her wheelchair with min assist and no upper extremity support in order to indicate improvement in function.    Baseline Patient requires mod assist from author and bilateral lower extremity support to transition to and from sitting    Time 12    Period Weeks    Status New    Target Date 10/31/21                   Plan - 09/12/21 2232     Clinical Impression Statement Patient presented with excellent motivation and responded well to seated LE strengthening exercises. [place]- stated she could help her be consistent with all exercises performed today. She then ambulated in //bar with min UE support so transitioned to use of walker. She was fatigued after walking but able to follow all VC for safe technique and patient very pleased to be walking today. The pt will benefit from further skilled PT to improve strength, mobility, gait, and activity tolerance.    Personal Factors and  Comorbidities Age;Comorbidity 1;Comorbidity 2;Comorbidity 3+;Time since onset of injury/illness/exacerbation    Comorbidities Depression, anxiety, HTN, MS, DDD, GERD    Examination-Activity Limitations Bed Mobility;Carry;Continence;Dressing;Hygiene/Grooming;Lift;Locomotion Level;Squat;Stairs;Stand;Toileting;Transfers    Examination-Participation Restrictions Cleaning;Community Activity;Laundry;Meal Prep;Shop    Stability/Clinical Decision Making Evolving/Moderate complexity    Rehab Potential Fair    Clinical Impairments Affecting Rehab Potential (+) family support, motivated (-) MS, decreased confidence, co morbidities    PT Frequency 2x / week    PT Duration 12 weeks    PT Treatment/Interventions Patient/family education;Gait training;Neuromuscular re-education;Therapeutic exercise;Manual techniques;ADLs/Self Care Home Management;Stair training;DME Instruction;Functional mobility training;Therapeutic activities;Balance training;Wheelchair mobility training;Energy conservation;Joint Manipulations    PT Next Visit Plan gait in // bars, strength, endurance    PT Home Exercise Plan Access Code: [email or web address]    Consulted and Agree with Plan of Care Patient             Patient will benefit from skilled therapeutic intervention in order to improve the following deficits and impairments:  Decreased strength, Decreased balance, Decreased activity tolerance, Decreased endurance, Difficulty walking, Abnormal gait, Improper body mechanics, Decreased mobility, Hypomobility  Visit Diagnosis: Abnormality of gait and mobility  Difficulty in walking, not elsewhere classified  Muscle weakness (generalized)  Unsteadiness on feet     Problem List Patient Active Problem List   Diagnosis Date Noted   Pre-op evaluation 08/31/2021   Rash 01/27/2021   History of CVA (  cerebrovascular accident) 01/05/2021   PBA (pseudobulbar affect)    MS (multiple sclerosis) ([place])    Anxiety and depression    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   Generalized anxiety disorder 12/11/2020   Acute focal neurological deficit 12/08/2020   Edema 09/28/2020   Bloating 08/25/2020   Environmental allergies 07/17/2019   Hyperglycemia 07/17/2019   Cough 06/06/2019   Memory change 05/29/2019   Abnormal liver function tests 01/09/2019   Hemorrhoid 09/27/2018   Nausea 08/11/2018   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   Depression with anxiety 02/04/2018   Cognitive and behavioral changes 02/04/2018   Constipation 11/17/2017   Hx of adenomatous colonic polyps 06/06/2016   Lower extremity edema 01/13/2016   Bilateral ovarian cysts 10/16/2015   Health care maintenance 10/16/2015   Colon polyp 01/25/2015   Bone/cartilage disorder 01/25/2015   Headache 11/18/2014   Depression, recurrent ([place]) 11/18/2014   Greater tuberosity of humerus fracture 11/18/2014   Multiple sclerosis ([place]) 11/18/2014   Unsteady gait 11/18/2014   Dizziness 11/18/2014   Inconclusive mammogram 03/15/2013   S/P lumbar spinal fusion 01/04/2013   Surgery, other elective 12/03/2012   DDD (degenerative disc disease), lumbosacral 10/15/2012   Back ache 09/06/2012   Hypertension 09/06/2012   Back pain 09/06/2012   Hypercholesterolemia 01/28/2012   Climacteric 01/28/2012   Routine general medical examination at a health care facility 01/28/2012   Avitaminosis D 01/28/2012    Lewis Moccasin, PT 09/13/2021, 10:10 AM  [place], [place], [address] Phone: [phone]   Fax:  [phone]  Name: Carly [name] MRN: [id number] Date of Birth: Jun 14, 1951

## 2021-09-13 NOTE — Patient Instructions (Signed)

## 2021-09-13 NOTE — Therapy (Signed)
[place] MAIN [facility] SERVICES [address] [place], [place], [address] Phone: [phone]   Fax:  [phone]  Occupational Therapy Treatment  Patient Details  Name: Carly [name] MRN: [id number] Date of Birth: [birth year] Referring Provider (OT): Einar Pheasant, MD   Encounter Date: 09/12/2021   OT End of Session - 09/12/21 1022     Visit Number 13    Number of Visits 36    Date for OT Re-Evaluation 12/12/21    OT Start Time 0305    OT Stop Time 0347    OT Time Calculation (min) 42 min    Equipment Utilized During Treatment tyvek slipper sock, friction gloves    Activity Tolerance Patient tolerated treatment well;No increased pain    Behavior During Therapy WFL for tasks assessed/performed             Past Medical History:  Diagnosis Date   Allergy    Anxiety    Aspiration pneumonia ([facility])    Depression    Frequent headaches    H/O   GERD (gastroesophageal reflux disease)    History of chicken pox    History of colon polyps    Hx of migraines    Multiple sclerosis ([facility])    PONV (postoperative nausea and vomiting)     Past Surgical History:  Procedure Laterality Date   BACK SURGERY     CHOLECYSTECTOMY     COLONOSCOPY WITH PROPOFOL N/A 05/18/2017   Procedure: COLONOSCOPY WITH PROPOFOL;  Surgeon: [name], MD;  Location: [facility] ENDOSCOPY;  Service: Endoscopy;  Laterality: N/A;   FOOT SURGERY  2015   GALLBLADDER SURGERY  2008   HARDWARE REMOVAL Left 02/14/2016   Procedure: LEFT FOOT REMOVAL DEEP IMPLANT;  Surgeon: [name], MD;  Location: [place];  Service: Orthopedics;  Laterality: Left;   HERNIA REPAIR     Inguinal Hernia Repair   SPINE SURGERY  2014    There were no vitals filed for this visit.   Subjective Assessment - 09/12/21 1030     Subjective  Carly [name] presents to OT for Rx visit  13/36 to addressBLE lymphedema (LE). Pt is accompanmnied by a paid attendant. Pt denies  pain n legs associated with LE. Pt wears recommended compression stockings to clinic.    Patient is accompanied by: Family member    Pertinent History Relevant to limb swelling: Depression /anxiety, MS, HTN. 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 weakness,    Special Tests Intake FOTO: 29/100 (functional outcome score)    Patient Stated Goals to be able to walk better, to get this swelling down. I hate it.    Pain Onset Other (comment)   leg pain and swelling first noticed during long care trip ~ 7 hours                         OT Treatments/Exercises (OP) - 09/13/21 1030       ADLs   ADL Education Given Yes      Manual Therapy   Manual Therapy Edema management    Compression Bandaging BLE ccl 2 OTS Juzo 83f knee highs                    OT Education - 09/13/21 1031  Education Details Continued Pt/ CG edu for lymphedema self care  and home program throughout session. Topics include progress towards OT goals to date, multilayer compression wrapping, simple self-MLD, therapeutic lymphatic pumping exercises, skin/nail care, LE risk reduction factors an precautions, compression garments/recommendations and wear and care schedule and compression garment donning / doffing using assistive devices. All questions answered to the Pt's satisfaction, and Pt demonstrates understanding by report.    Person(s) Educated Patient;Spouse;Caregiver(s)    Methods Explanation;Demonstration;Handout;Verbal cues    Comprehension Verbalized understanding;Returned demonstration;Need further instruction;Verbal cues required;Tactile cues required                 OT Long Term Goals - 09/13/21 1029       OT LONG TERM GOAL #1   Title Given this patient's risk-adjustment variables, her Intake  Functional Status score of 29/100 on the FOTO tool, patient will experience at least an increase in function of 3 points for a score of 32/100.    Baseline 97/100    Time 12    Period Weeks    Status On-going    Target Date 12/12/21      OT LONG TERM GOAL #2   Title Pt will be able to verbalize at least 4 lymphedema precautions and prevention strategies with Max caregiver assist to reduce infection risk limit progression of lymphedema.    Baseline dependent    Time 6    Period Days    Status Achieved   In progress. Pt and spouse have short term memory issues that is delaying this goal.We continue to review each session, also with paid caregivers.     OT LONG TERM GOAL #3   Title Pt will be able to apply multi-layer, short stretch compression wraps to one leg at a time using correct gradient techniques with max assistance in an effort  to return the affected  limb(s)  to premorbid size and shape, to limit pain and infection risk, and to improve functional mobility and ambulation  for ADLs.    Baseline dependent    Time 6    Period Days    Status Achieved      OT LONG TERM GOAL #4   Title Pt will achieve at least a 10% limb volume reduction bilaterally to limit lymphedema progression, to improve tissue health and limit infection risk, and to increase tissue flexibility essential for optimal AROM and safe functional ambulation and mobility.    Baseline dependent    Time 12    Period Weeks    Status Partially Met   By visual assessment I estimate Pt has achieved at least a 10% limb volume reduction in the LLE below the knee. Garments have been ordered . will assess w formal volumetrics next session.     OT LONG TERM GOAL #5   Title With Max caregiver assistance Pt will achieve and sustain a least 85% compliance with all LE self-care home program components throughout Intensive Phase CDT, including frequent elevation when seated, daily skin inspection and care, lymphatic pumping ther ex, 23/7  compression wraps and simple self-MLD, to sustain clinical gains made in CDT and to limit lymphedema progression and further functional decline.    Baseline dependent    Time 12    Period Weeks    Status Achieved      OT LONG TERM GOAL #6   Title Using assistive devices (modified independence)  and Max CG assistance Pt will be able to don and doff appropriate daytime compression garments  to limit lymphatic re-accumulation and LE progression before transitioning to self-management phase of CDT.    Baseline dependent    Time 12    Period Weeks    Status Achieved                   Plan - 09/13/21 1027     Clinical Impression Statement Emphasis of visit on fitting and assessing recommended compressin knee highs, and on training Pt and caregiver to use assistive devices to don/ doff garments. By end of session paid CG able to don garments and fit them correctly using assistive devices and a little extra time. Bandages DCd today. Cont as per OC.    OT Occupational Profile and History Comprehensive Assessment- Review of records and extensive additional review of physical, cognitive, psychosocial history related to current functional performance    Occupational performance deficits (Please refer to evaluation for details): ADL's;IADL's;Work;Leisure;Social Participation;Other   body image   Body Structure / Function / Physical Skills ADL;Edema;Skin integrity;Pain;Decreased knowledge of precautions;Decreased knowledge of use of DME;IADL    Rehab Potential Fair    Clinical Decision Making Multiple treatment options, significant modification of task necessary    Comorbidities Affecting Occupational Performance: Presence of comorbidities impacting occupational performance    Comorbidities impacting occupational performance description: See SUBJECTIVE for relevant Dx    Modification or Assistance to Complete Evaluation  Max significant modification of tasks or assist is necessary to complete     OT Frequency 2x / week    OT Duration 12 weeks   and PRN for follow along support and caregiver support   OT Treatment/Interventions Self-care/ADL training;Therapeutic exercise;Coping strategies training;Therapeutic activities;Manual lymph drainage;Energy conservation;Manual Therapy;Other (comment);DME and/or AE instruction;Compression bandaging;Patient/family education   skin care to limit infection risk and increase skin flexibility   Plan Comple Decongestive Therapy: Manual Lymphatic Drainage (MLD, skin care, therapeutic exercise, compression therapy using short stretch    wraps and compression garments/ devices to retain clinical gains    Recommended Other Services Consider trial with advanced sequential pneumatic compression device, or "pump". Flexitouch device recommended over all as this stimulates lymphatics proximally to distal duplicating anatomic function. Need MD clearance due to Hx of CVA and MS. Pt may not tolerate 2/2 anxiety, but will explore her interest. Consider alternative to Amlodipine due to limb swelling side effect.    Consulted and Agree with Plan of Care Family member/caregiver;Patient    Family Member Consulted Spouse, Bill             Patient will benefit from skilled therapeutic intervention in order to improve the following deficits and impairments:   Body Structure / Function / Physical Skills: ADL, Edema, Skin integrity, Pain, Decreased knowledge of precautions, Decreased knowledge of use of DME, IADL       Visit Diagnosis: Lymphedema, not elsewhere classified    Problem List Patient Active Problem List   Diagnosis Date Noted   Pre-op evaluation 08/31/2021   Rash 01/27/2021   History of CVA (cerebrovascular accident) 01/05/2021   PBA (pseudobulbar affect)    MS (multiple sclerosis) ([place])    Anxiety and depression    White matter periventricular infarction ([place]) 12/13/2020   Generalized anxiety disorder 12/11/2020   Acute focal neurological deficit  12/08/2020   Edema 09/28/2020   Bloating 08/25/2020   Environmental allergies 07/17/2019   Hyperglycemia 07/17/2019   Cough 06/06/2019   Memory change 05/29/2019   Abnormal liver function tests 01/09/2019   Hemorrhoid 09/27/2018   Nausea 08/11/2018  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   Depression with anxiety 02/04/2018   Cognitive and behavioral changes 02/04/2018   Constipation 11/17/2017   Hx of adenomatous colonic polyps 06/06/2016   Lower extremity edema 01/13/2016   Bilateral ovarian cysts 10/16/2015   Health care maintenance 10/16/2015   Colon polyp 01/25/2015   Bone/cartilage disorder 01/25/2015   Headache 11/18/2014   Depression, recurrent ([place]) 11/18/2014   Greater tuberosity of humerus fracture 11/18/2014   Multiple sclerosis ([place]) 11/18/2014   Unsteady gait 11/18/2014   Dizziness 11/18/2014   Inconclusive mammogram 03/15/2013   S/P lumbar spinal fusion 01/04/2013   Surgery, other elective 12/03/2012   DDD (degenerative disc disease), lumbosacral 10/15/2012   Back ache 09/06/2012   Hypertension 09/06/2012   Back pain 09/06/2012   Hypercholesterolemia 01/28/2012   Climacteric 01/28/2012   Routine general medical examination at a health care facility 01/28/2012   Avitaminosis D 01/28/2012    Andrey Spearman, MS, OTR/L, [facility] 09/13/21 10:33 AM   [place] MAIN [facility] SERVICES [address] [place], [place], [address] Phone: [phone]   Fax:  [phone]  Name: Carly [name] MRN: [id number] Date of Birth: July 25, 1951

## 2021-09-17 ENCOUNTER — Telehealth: Payer: Self-pay | Admitting: Physical Medicine and Rehabilitation

## 2021-09-17 ENCOUNTER — Ambulatory Visit: Payer: Medicare Other | Admitting: Occupational Therapy

## 2021-09-17 ENCOUNTER — Other Ambulatory Visit: Payer: Self-pay

## 2021-09-17 DIAGNOSIS — R2689 Other abnormalities of gait and mobility: Secondary | ICD-10-CM | POA: Diagnosis not present

## 2021-09-17 DIAGNOSIS — M6281 Muscle weakness (generalized): Secondary | ICD-10-CM | POA: Diagnosis not present

## 2021-09-17 DIAGNOSIS — R278 Other lack of coordination: Secondary | ICD-10-CM | POA: Diagnosis not present

## 2021-09-17 DIAGNOSIS — I890 Lymphedema, not elsewhere classified: Secondary | ICD-10-CM

## 2021-09-17 DIAGNOSIS — R269 Unspecified abnormalities of gait and mobility: Secondary | ICD-10-CM | POA: Diagnosis not present

## 2021-09-17 DIAGNOSIS — R2681 Unsteadiness on feet: Secondary | ICD-10-CM | POA: Diagnosis not present

## 2021-09-17 DIAGNOSIS — R262 Difficulty in walking, not elsewhere classified: Secondary | ICD-10-CM | POA: Diagnosis not present

## 2021-09-17 NOTE — Telephone Encounter (Signed)
Ptn called and wants KR to give advice about finishing her Vitamin D when does she start back normal does (no RX name) her number [phone]

## 2021-09-18 ENCOUNTER — Ambulatory Visit: Payer: Medicare Other

## 2021-09-18 NOTE — Patient Instructions (Signed)

## 2021-09-18 NOTE — Therapy (Signed)
[place] MAIN [facility] SERVICES [address] [place], [place], [address] Phone: [phone]   Fax:  [phone]  Occupational Therapy Treatment  Patient Details  Name: Carly [name] MRN: [id number] Date of Birth: February 07, 1951 Referring Provider (OT): Einar Pheasant, MD   Encounter Date: 09/17/2021   OT End of Session - 09/17/21 0955     Visit Number 14    Number of Visits 36    Date for OT Re-Evaluation 12/12/21    OT Start Time 0200    OT Stop Time 0310    OT Time Calculation (min) 70 min    Equipment Utilized During Treatment tyvek slipper sock, friction gloves    Activity Tolerance Patient tolerated treatment well;No increased pain    Behavior During Therapy WFL for tasks assessed/performed             Past Medical History:  Diagnosis Date   Allergy    Anxiety    Aspiration pneumonia (HCC)    Depression    Frequent headaches    H/O   GERD (gastroesophageal reflux disease)    History of chicken pox    History of colon polyps    Hx of migraines    Multiple sclerosis (HCC)    PONV (postoperative nausea and vomiting)     Past Surgical History:  Procedure Laterality Date   BACK SURGERY     CHOLECYSTECTOMY     COLONOSCOPY WITH PROPOFOL N/A 05/18/2017   Procedure: COLONOSCOPY WITH PROPOFOL;  Surgeon: [name], MD;  Location: [facility] ENDOSCOPY;  Service: Endoscopy;  Laterality: N/A;   FOOT SURGERY  2015   GALLBLADDER SURGERY  2008   HARDWARE REMOVAL Left 02/14/2016   Procedure: LEFT FOOT REMOVAL DEEP IMPLANT;  Surgeon: [name], MD;  Location: [place];  Service: Orthopedics;  Laterality: Left;   HERNIA REPAIR     Inguinal Hernia Repair   SPINE SURGERY  2014    There were no vitals filed for this visit.   Subjective Assessment - 09/17/21 0959     Subjective  Cailee JEROLENE KUPFER presents to OT for Rx visit  14/36 to addressBLE lymphedema (LE). Pt is accompanmnied by her spouse, [name].  Pt denies  pain n legs associated with LE. Pt wears recommended compression stockings to clinic.    Patient is accompanied by: Family member    Pertinent History Relevant to limb swelling: Depression /anxiety, MS, HTN. 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 weakness,    Special Tests Intake FOTO: 29/100 (functional outcome score)    Patient Stated Goals to be able to walk better, to get this swelling down. I hate it.    Pain Onset Other (comment)   leg pain and swelling first noticed during long care trip ~ 7 hours                         OT Treatments/Exercises (OP) - 09/18/21 0959       ADLs   ADL Education Given Yes      Manual Therapy   Manual Therapy Edema management    Manual therapy comments skin care with Castor oil throughout MLD    Manual Lymphatic Drainage (MLD) LLE/LLQ MLD utilizing modified short neck sequence ( no lateral neck strokes) deep abdominal pathways (diaphragmatic breathing), functional  inguinal LN, and proximal to distal strokes to thigh, bottleneck at knee, leg and foot. # retrograde full sequences from too to clavical to end. Good tolerance    Compression Bandaging BLE ccl 2 OTS Juzo size III  knee highs- open toe, standard band                    OT Education - 09/18/21 1001     Education Details Continued Pt/ CG edu for lymphedema self care  and home program throughout session. Topics include progress towards OT goals to date, multilayer compression wrapping, simple self-MLD, therapeutic lymphatic pumping exercises, skin/nail care, LE risk reduction factors an precautions, compression garments/recommendations and wear and care schedule and compression garment donning / doffing using assistive devices. All questions answered to the Pt's satisfaction, and Pt demonstrates  understanding by report.    Person(s) Educated Patient;Spouse;Caregiver(s)    Methods Explanation;Demonstration;Handout;Verbal cues    Comprehension Verbalized understanding;Returned demonstration;Need further instruction;Verbal cues required;Tactile cues required                 OT Long Term Goals - 09/13/21 1029       OT LONG TERM GOAL #1   Title Given this patient's risk-adjustment variables, her Intake Functional Status score of 29/100 on the FOTO tool, patient will experience at least an increase in function of 3 points for a score of 32/100.    Baseline 97/100    Time 12    Period Weeks    Status On-going    Target Date 12/12/21      OT LONG TERM GOAL #2   Title Pt will be able to verbalize at least 4 lymphedema precautions and prevention strategies with Max caregiver assist to reduce infection risk limit progression of lymphedema.    Baseline dependent    Time 6    Period Days    Status Achieved   In progress. Pt and spouse have short term memory issues that is delaying this goal.We continue to review each session, also with paid caregivers.     OT LONG TERM GOAL #3   Title Pt will be able to apply multi-layer, short stretch compression wraps to one leg at a time using correct gradient techniques with max assistance in an effort  to return the affected  limb(s)  to premorbid size and shape, to limit pain and infection risk, and to improve functional mobility and ambulation  for ADLs.    Baseline dependent    Time 6    Period Days    Status Achieved      OT LONG TERM GOAL #4   Title Pt will achieve at least a 10% limb volume reduction bilaterally to limit lymphedema progression, to improve tissue health and limit infection risk, and to increase tissue flexibility essential for optimal AROM and safe functional ambulation and mobility.    Baseline dependent    Time 12    Period Weeks    Status Partially Met   By visual assessment I estimate Pt has achieved at least a  10% limb volume reduction in the LLE below the knee. Garments have been ordered . will assess w formal volumetrics next session.     OT LONG TERM GOAL #5   Title With Max caregiver assistance Pt will achieve and sustain a least 85% compliance with all LE self-care home program components throughout Intensive Phase CDT, including frequent elevation when seated, daily skin inspection and care, lymphatic pumping ther ex, 23/7 compression wraps  and simple self-MLD, to sustain clinical gains made in CDT and to limit lymphedema progression and further functional decline.    Baseline dependent    Time 12    Period Weeks    Status Achieved      OT LONG TERM GOAL #6   Title Using assistive devices (modified independence)  and Max CG assistance Pt will be able to don and doff appropriate daytime compression garments  to limit lymphatic re-accumulation and LE progression before transitioning to self-management phase of CDT.    Baseline dependent    Time 12    Period Weeks    Status Achieved                   Plan - 09/17/21 0955     Clinical Impression Statement Continued skilled training for garment donning, doffing and correct fitting using assistive devices. Will continue to review PRN. Recommended Pt purchase 2nd pair of like garments only 1 saze larger. I believe these will controll swelling just as well, but will be a little easier to don and doff. Pt is pleased with garment fit, function anfd comfort. She is shopping to replace shoes with recommended flosed foot style to limit foot swelling in current strap style [employer]. Pt tolerated MLD and skin care as established without increased pain., Cont as per POC.    OT Occupational Profile and History Comprehensive Assessment- Review of records and extensive additional review of physical, cognitive, psychosocial history related to current functional performance    Occupational performance deficits (Please refer to evaluation for details):  ADL's;IADL's;Work;Leisure;Social Participation;Other   body image   Body Structure / Function / Physical Skills ADL;Edema;Skin integrity;Pain;Decreased knowledge of precautions;Decreased knowledge of use of DME;IADL    Rehab Potential Fair    Clinical Decision Making Multiple treatment options, significant modification of task necessary    Comorbidities Affecting Occupational Performance: Presence of comorbidities impacting occupational performance    Comorbidities impacting occupational performance description: See SUBJECTIVE for relevant Dx    Modification or Assistance to Complete Evaluation  Max significant modification of tasks or assist is necessary to complete    OT Frequency 2x / week    OT Duration 12 weeks   and PRN for follow along support and caregiver support   OT Treatment/Interventions Self-care/ADL training;Therapeutic exercise;Coping strategies training;Therapeutic activities;Manual lymph drainage;Energy conservation;Manual Therapy;Other (comment);DME and/or AE instruction;Compression bandaging;Patient/family education   skin care to limit infection risk and increase skin flexibility   Plan Comple Decongestive Therapy: Manual Lymphatic Drainage (MLD, skin care, therapeutic exercise, compression therapy using short stretch    wraps and compression garments/ devices to retain clinical gains    Recommended Other Services Consider trial with advanced sequential pneumatic compression device, or "pump". Flexitouch device recommended over all as this stimulates lymphatics proximally to distal duplicating anatomic function. Need MD clearance due to Hx of CVA and MS. Pt may not tolerate 2/2 anxiety, but will explore her interest. Consider alternative to Amlodipine due to limb swelling side effect.    Consulted and Agree with Plan of Care Family member/caregiver;Patient    Family Member Consulted Spouse, Bill             Patient will benefit from skilled therapeutic intervention in order  to improve the following deficits and impairments:   Body Structure / Function / Physical Skills: ADL, Edema, Skin integrity, Pain, Decreased knowledge of precautions, Decreased knowledge of use of DME, IADL       Visit Diagnosis: Lymphedema, not elsewhere  classified    Problem List Patient Active Problem List   Diagnosis Date Noted   Pre-op evaluation 08/31/2021   Rash 01/27/2021   History of CVA (cerebrovascular accident) 01/05/2021   PBA (pseudobulbar affect)    MS (multiple sclerosis) ([place])    Anxiety and depression    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   Generalized anxiety disorder 12/11/2020   Acute focal neurological deficit 12/08/2020   Edema 09/28/2020   Bloating 08/25/2020   Environmental allergies 07/17/2019   Hyperglycemia 07/17/2019   Cough 06/06/2019   Memory change 05/29/2019   Abnormal liver function tests 01/09/2019   Hemorrhoid 09/27/2018   Nausea 08/11/2018   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   Depression with anxiety 02/04/2018   Cognitive and behavioral changes 02/04/2018   Constipation 11/17/2017   Hx of adenomatous colonic polyps 06/06/2016   Lower extremity edema 01/13/2016   Bilateral ovarian cysts 10/16/2015   Health care maintenance 10/16/2015   Colon polyp 01/25/2015   Bone/cartilage disorder 01/25/2015   Headache 11/18/2014   Depression, recurrent ([place]) 11/18/2014   Greater tuberosity of humerus fracture 11/18/2014   Multiple sclerosis ([place]) 11/18/2014   Unsteady gait 11/18/2014   Dizziness 11/18/2014   Inconclusive mammogram 03/15/2013   S/P lumbar spinal fusion 01/04/2013   Surgery, other elective 12/03/2012   DDD (degenerative disc disease), lumbosacral 10/15/2012   Back ache 09/06/2012   Hypertension 09/06/2012   Back pain 09/06/2012   Hypercholesterolemia 01/28/2012   Climacteric 01/28/2012   Routine general medical examination at a health care facility 01/28/2012   Avitaminosis D 01/28/2012    Andrey Spearman, MS, OTR/L, [facility] 09/18/21 10:02 AM   [place] MAIN [facility] SERVICES [place], [place], [address] Phone: [phone]   Fax:  [phone]  Name: YING BLANKENHORN MRN: [id number] Date of Birth: 12/23/1950

## 2021-09-19 ENCOUNTER — Ambulatory Visit: Payer: Medicare Other

## 2021-09-19 ENCOUNTER — Other Ambulatory Visit: Payer: Self-pay

## 2021-09-19 ENCOUNTER — Ambulatory Visit (INDEPENDENT_AMBULATORY_CARE_PROVIDER_SITE_OTHER): Payer: Medicare Other | Admitting: Neurology

## 2021-09-19 ENCOUNTER — Encounter: Payer: Self-pay | Admitting: Neurology

## 2021-09-19 VITALS — BP 116/76 | HR 88 | Ht 63.0 in | Wt 172.0 lb

## 2021-09-19 DIAGNOSIS — R2681 Unsteadiness on feet: Secondary | ICD-10-CM

## 2021-09-19 DIAGNOSIS — R2689 Other abnormalities of gait and mobility: Secondary | ICD-10-CM | POA: Diagnosis not present

## 2021-09-19 DIAGNOSIS — Z7952 Long term (current) use of systemic steroids: Secondary | ICD-10-CM

## 2021-09-19 DIAGNOSIS — I639 Cerebral infarction, unspecified: Secondary | ICD-10-CM

## 2021-09-19 DIAGNOSIS — R278 Other lack of coordination: Secondary | ICD-10-CM

## 2021-09-19 DIAGNOSIS — M6281 Muscle weakness (generalized): Secondary | ICD-10-CM

## 2021-09-19 DIAGNOSIS — G35 Multiple sclerosis: Secondary | ICD-10-CM

## 2021-09-19 DIAGNOSIS — R269 Unspecified abnormalities of gait and mobility: Secondary | ICD-10-CM | POA: Diagnosis not present

## 2021-09-19 DIAGNOSIS — F418 Other specified anxiety disorders: Secondary | ICD-10-CM | POA: Diagnosis not present

## 2021-09-19 DIAGNOSIS — R262 Difficulty in walking, not elsewhere classified: Secondary | ICD-10-CM | POA: Diagnosis not present

## 2021-09-19 DIAGNOSIS — R609 Edema, unspecified: Secondary | ICD-10-CM | POA: Diagnosis not present

## 2021-09-19 MED ORDER — AMANTADINE HCL 100 MG PO CAPS: 100.0000 mg | ORAL_CAPSULE | Freq: Two times a day (BID) | ORAL | 5 refills | Status: DC

## 2021-09-19 MED ORDER — BUSPIRONE HCL 15 MG PO TABS: 15.0000 mg | ORAL_TABLET | Freq: Two times a day (BID) | ORAL | 5 refills | Status: DC

## 2021-09-19 NOTE — Therapy (Signed)
[place] MAIN [facility] SERVICES [address] [place], [place], [address] Phone: [phone]   Fax:  [phone]  Physical Therapy Treatment  Patient Details  Name: Carly [name] MRN: [id number] Date of Birth: [birth year] Referring Provider (PT): Einar Pheasant MD   Encounter Date: 09/19/2021   PT End of Session - 09/19/21 1508     Visit Number 9    Number of Visits 24    Date for PT Re-Evaluation 10/31/21    Authorization Time Period 08/08/21-10/31/20    Progress Note Due on Visit 10    PT Start Time 1049    PT Stop Time 1130    PT Time Calculation (min) 41 min    Equipment Utilized During Treatment Gait belt    Activity Tolerance Patient tolerated treatment well    Behavior During Therapy [facility] for tasks assessed/performed             Past Medical History:  Diagnosis Date   Allergy    Anxiety    Aspiration pneumonia ([place])    Depression    Frequent headaches    H/O   GERD (gastroesophageal reflux disease)    History of chicken pox    History of colon polyps    Hx of migraines    Multiple sclerosis ([place]) 2011   OSA (obstructive sleep apnea)    PONV (postoperative nausea and vomiting)     Past Surgical History:  Procedure Laterality Date   BACK SURGERY     CHOLECYSTECTOMY     COLONOSCOPY WITH PROPOFOL N/A 05/18/2017   Procedure: COLONOSCOPY WITH PROPOFOL;  Surgeon: [name], MD;  Location: [facility] ENDOSCOPY;  Service: Endoscopy;  Laterality: N/A;   FOOT SURGERY  2015   GALLBLADDER SURGERY  2008   HARDWARE REMOVAL Left 02/14/2016   Procedure: LEFT FOOT REMOVAL DEEP IMPLANT;  Surgeon: [name], MD;  Location: [place];  Service: Orthopedics;  Laterality: Left;   HERNIA REPAIR     Inguinal Hernia Repair   SPINE SURGERY  2014    There were no vitals filed for this visit.   Subjective Assessment - 09/19/21 1052     Subjective Pt reports improved sleep since using CPAP. Pt reports no pain  currently. Pt reports no falls or stumbles. Pt reports she has done some of her HEP.    Patient is accompained by: Family member    Pertinent History Pt reports she is in PT on order to allow for improvement with walking. Pt reports she has not been able to get [facility] PT because she is being seen by outpatient OT services. Pt reports she was walking  and her caregiver who helped her walk had a heart attack. Pt reports the last time she was walking regularly was about 2 years ago. She reports she gets up to [facility] with some assistance from her husband. Pt has walker, rollator and WC at home. Pt reports she used them often until her caregiver had the heart attack. Pt switched to new MS medication a couple years ago and attributes some of her dificits to the medicaiton change. Pt has significant difficulty with any activities involving standing. Pt lives in home wiht ramp to enter. House is one story with an upstairs but she lives primarilly on the first floor. Pt has shower which she can access. 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 She has weighted bracelets she has been using for arm strength. Pt reports she has  not been to outpatient PT in the past but she has had [facility] in the past. Pt reports swimming in past for exercise. She also reports she is moving to [place] [facility] in January.    Limitations Standing;Walking    How long can you stand comfortably? 15 min.    How long can you walk comfortably? 10 min.    Patient Stated Goals Standing, walking, transfers    Currently in Pain? No/denies    Pain Onset Other (comment)   leg pain and swelling first noticed during long care trip ~ 7 hours             INTERVENTIONS:    seated:  Hip march 15x each LE -15x LLE focus, PT provides TC for improved AROM  Comments: Pt rates as hard  Alternating LAQ 12x each LE- Cues for eccentric control 12x LLE focus, PT provides intermittent hands-on assist to encourage increased knee ext.   Comments: Pt rates as medium  Seated toe raises x 15 BLE  Seated heel raises x 15 BLE- Patient continues to exhibit minimal height on left LE, PT provides hands-on assist to LLE for multiple reps to help improve DFs activation  In // bars with CGA-mod assist and WC from pt's caregiver:  Standing lateral weight shift  x 10 with focus on hip shifts   Amb in // bars pt ambulating forward and backwards (encouraging upright posture, pushing from arm rests when initially standing, big steps on L, and activating quads) 8x. Pt uses heavy BUE support and up to mod a from PT.  Standing heel taps 10x each LE with min a and pt utilizing heavy BUE support on bars  Pt performs STS throughout session for standing interventions pulling up on bar. Pt was able to perform one rep pushing through [place].   Hedgehog seated heel taps 10x BLEs.   Pt educated throughout session about proper posture and technique with exercises. Improved exercise technique, movement at target joints, use of target muscles after min to mod verbal, visual, tactile cues.      PT Education - 09/19/21 1502     Education provided Yes    Education Details exercise technique, body mechanics    Person(s) Educated Patient    Methods Explanation;Demonstration;Tactile cues;Verbal cues    Comprehension Returned demonstration;Tactile cues required;Verbal cues required;Verbalized understanding;Need further instruction              PT Short Term Goals - 08/08/21 1724       PT SHORT TERM GOAL #1   Title Patient will be independent in home exercise program to improve strength/mobility for better functional independence with ADLs.    Baseline Pt does not have HEP    Time 4    Period Weeks    Status New    Target Date 09/05/21      PT SHORT TERM GOAL #2   Title Patient will increase FOTO score to equal to or greater than 4    to demonstrate statistically significant improvement in mobility and quality of life.    Baseline 28     Time 4    Period Weeks    Target Date 09/05/21               PT Long Term Goals - 08/08/21 1720       PT LONG TERM GOAL #1   Title Patient will be independent with advanced and progressive home program for strength and endurance in order to transition to  self management    Baseline Pt does not have current formal HEP    Time 4    Period Weeks    Status New    Target Date 10/03/21      PT LONG TERM GOAL #2   Title pt's FOTO score will improve by 4 points or more indicating improved confidence with daily tasks at home    Baseline 28 at intake    Time 6    Period Weeks    Status New      PT LONG TERM GOAL #3   Title Patient will deny any falls over past 6 weeks to demonstrate improved safety awareness at home and work.    Baseline Pt reports 3 falls in previous 6 months    Time 6    Period Weeks    Status New      PT LONG TERM GOAL #4   Title Patient will perform sit to and from stand from her wheelchair with min assist and no upper extremity support in order to indicate improvement in function.    Baseline Patient requires mod assist from author and bilateral lower extremity support to transition to and from sitting    Time 12    Period Weeks    Status New    Target Date 10/31/21                   Plan - 09/19/21 1537     Clinical Impression Statement Pt continues to be highly motivated to participate in session. Pt still requires frequent rest breaks secondary to fatigue. However, pt still able to perform several minutes of standing/ambulating interventions. The pt does require frequent cuing for upright posture, and exhibits difficulty with foot clearance on LLE, but is able to intermittently improve with cues. The pt will benefit from further skilled PT to improve strength, gait, mobility, and activity tolerance.    Personal Factors and Comorbidities Age;Comorbidity 1;Comorbidity 2;Comorbidity 3+;Time since onset of injury/illness/exacerbation     Comorbidities Depression, anxiety, HTN, MS, DDD, GERD    Examination-Activity Limitations Bed Mobility;Carry;Continence;Dressing;Hygiene/Grooming;Lift;Locomotion Level;Squat;Stairs;Stand;Toileting;Transfers    Examination-Participation Restrictions Cleaning;Community Activity;Laundry;Meal Prep;Shop    Stability/Clinical Decision Making Evolving/Moderate complexity    Rehab Potential Fair    Clinical Impairments Affecting Rehab Potential (+) family support, motivated (-) MS, decreased confidence, co morbidities    PT Frequency 2x / week    PT Duration 12 weeks    PT Treatment/Interventions Patient/family education;Gait training;Neuromuscular re-education;Therapeutic exercise;Manual techniques;ADLs/Self Care Home Management;Stair training;DME Instruction;Functional mobility training;Therapeutic activities;Balance training;Wheelchair mobility training;Energy conservation;Joint Manipulations    PT Next Visit Plan gait in // bars, strength, endurance; continue POC as previously indicated    PT Home Exercise Plan Access Code: [email or web address]; no updates    Consulted and Agree with Plan of Care Patient             Patient will benefit from skilled therapeutic intervention in order to improve the following deficits and impairments:  Decreased strength, Decreased balance, Decreased activity tolerance, Decreased endurance, Difficulty walking, Abnormal gait, Improper body mechanics, Decreased mobility, Hypomobility  Visit Diagnosis: Other abnormalities of gait and mobility  Muscle weakness (generalized)  Unsteadiness on feet  Other lack of coordination     Problem List Patient Active Problem List   Diagnosis Date Noted   Pre-op evaluation 08/31/2021   Rash 01/27/2021   History of CVA (cerebrovascular accident) 01/05/2021   PBA (pseudobulbar affect)    MS (multiple sclerosis) (HCC)    Anxiety and depression  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   Generalized anxiety  disorder 12/11/2020   Acute focal neurological deficit 12/08/2020   Edema 09/28/2020   Bloating 08/25/2020   Environmental allergies 07/17/2019   Hyperglycemia 07/17/2019   Cough 06/06/2019   Memory change 05/29/2019   Abnormal liver function tests 01/09/2019   Hemorrhoid 09/27/2018   Nausea 08/11/2018   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   Depression with anxiety 02/04/2018   Cognitive and behavioral changes 02/04/2018   Constipation 11/17/2017   Hx of adenomatous colonic polyps 06/06/2016   Lower extremity edema 01/13/2016   Bilateral ovarian cysts 10/16/2015   Health care maintenance 10/16/2015   Colon polyp 01/25/2015   Bone/cartilage disorder 01/25/2015   Headache 11/18/2014   Depression, recurrent ([place]) 11/18/2014   Greater tuberosity of humerus fracture 11/18/2014   Multiple sclerosis ([place]) 11/18/2014   Unsteady gait 11/18/2014   Dizziness 11/18/2014   Inconclusive mammogram 03/15/2013   S/P lumbar spinal fusion 01/04/2013   Surgery, other elective 12/03/2012   DDD (degenerative disc disease), lumbosacral 10/15/2012   Back ache 09/06/2012   Hypertension 09/06/2012   Back pain 09/06/2012   Hypercholesterolemia 01/28/2012   Climacteric 01/28/2012   Routine general medical examination at a health care facility 01/28/2012   Avitaminosis D 01/28/2012    Zollie Pee, PT 09/19/2021, 3:43 PM  [place] MAIN [facility] SERVICES [address] [place], [place], [address] Phone: [phone]   Fax:  [phone]  Name: Carly [name] MRN: [id number] Date of Birth: 01/07/1951

## 2021-09-19 NOTE — Progress Notes (Signed)
[facility]  PATIENT: Carly [name] DOB: 11-03-1950  REFERRING DOCTOR OR PCP: Einar Pheasant, MD SOURCE: Patient, notes from neurology Larence Penning, [facility], [facility] neurology), imaging and lab reports, MRI images personally reviewed  _________________________________   HISTORICAL  CHIEF COMPLAINT:  Chief Complaint  Patient presents with   New Patient (Initial Visit)    Rm 2, w husband Bill. Internal referral for MS. Dx with MS in 2011. Pt is here for possible TOC. On Solu Medrol 1000mg  for the last 5 months. Pt had a stroke in mini stroke in feb, unable to use R arm/hand. Pt had a SS done and has started CPAP tx since Friday.     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  She is a 70 year old woman who was diagnosed with MS in 2012 after presenting with progressive left greater than right leg weakness and reduced gait.  She had an MRI of the brain and was found to have severe white matter changes.  In retrospect, she noted that she had difficulties with her left leg including a limp for many years t before the MRI, much worse in hot weather.     She felt she fluctuated a lot without much progression for many years before she started to progress more continuously..     Because of the severe extent of white matter changes, not completely typical for MS alternative diagnoses including CADASIL were considered.  The notch 3 PCR was negative.  CSF showed greater than 5 oligoclonal bands.  Therefore, she was diagnosed with primary progressive MS as her time course was more consistent with that then secondary progressive MS.  She had earlier been placed on Tysabri (Dr. Jacqulynn Cadet) around 2014 for a couple years and then switched to [facility] in 2017.  However she felt that she progressed more well on Ocrevus then  not on the medication and it was stopped after 4 courses.  She more recently has been seeing Dr. Cristie Hem and is on once a month IV Solu-Medrol.  She does not think it is helping much.  Currently, gait is poor and she needs a walker for short distances (< 50 feet) but wheelchair outside the house.  She has left > right leg weakness and spasticity.   She denies numbness or tingling   Bladder is fine.   She has reduced vision  She continues to have a lot of anxiety.   She is on Seroquel 50 mg at night and sometimes 25 mg as needed during the day.   Years ago was on Buspar but stopped as she had some headaches.   She doe snot recall if it helped.     She was diagnosed with OSA and just started Auto-PAP last week.  ARES HST was 08/08/2021 showed AHI = 44.    Last night was her forst full night of use.  He sees Dr. Raul Del for the OSA.   She had excessive daytime sleepiness and her husband feels she is probably better.     She had a small stroke 11/28/2020 involving the deep white matter of the left frontal  lobe causing right arm weakness    She did have some improvement.   Imaging: MRI of the cervical spine 12/19/2010 showed foci adjacent to C2 to the left, C2-C3 posteriorly C3-C4 posterolaterally to the left, C5-C6 laterally bilaterally  The MRI was followed up with an MRI of the brain 01/07/2011.  And MRI 03/12/2016 both showing a stable pattern of severe white matter hyperintense signal changes with some involvement of the anterior temporal lobes, external capsules and basal ganglia.  The pattern is fairly symmetric.  She also has moderate cortical atrophy.  MRI of the cervical and thoracic spine 04/02/2018 show a similar extent of plaque in the cervical spine as in 2012 though some of the foci are more apparent.  The thoracic spine shows multiple foci including a large focus laterally to the left adjacent to T8-T9 likely playing a role in her left leg weakness  MRI of the brain 11/28/2020 shows a  mostly stable pattern of severe white matter change and a couple foci in the pons and left cerebellar hemisphere.  The MRI also showed a small stroke in the left corona radiata with DWI changes much more consistent with infarction than MS.  Additionally there is atrophy that have progressed mildly compared to 2017.  The MR angiogram 12/09/2020 showed mild stenosis in the PCAs but no major stenosis or occlusion.    REVIEW OF SYSTEMS: Constitutional: No fevers, chills, sweats, or change in appetite Eyes: No visual changes, double vision, eye pain Ear, nose and throat: No hearing loss, ear pain, nasal congestion, sore throat Cardiovascular: No chest pain, palpitations Respiratory:  No shortness of breath at rest or with exertion.   No wheezes GastrointestinaI: No nausea, vomiting, diarrhea, abdominal pain, fecal incontinence Genitourinary:  No dysuria, urinary retention or frequency.  No nocturia. Musculoskeletal:  No neck pain, back pain Integumentary: No rash, pruritus, skin lesions.  She has ankle edema, left greater than right Neurological: as above Psychiatric: She has anxiety. Endocrine: No palpitations, diaphoresis, change in appetite, change in weigh or increased thirst Hematologic/Lymphatic:  No anemia, purpura, petechiae. Allergic/Immunologic: No itchy/runny eyes, nasal congestion, recent allergic reactions, rashes  ALLERGIES: Allergies  Allergen Reactions   Ibuprofen Swelling and Other (See Comments)   Sulfamethoxazole-Trimethoprim Itching   Penicillin G Rash   Asa [Aspirin] Swelling   Buspirone Other (See Comments)    Headaches     Levofloxacin Other (See Comments)    "weakness"   Lexapro [Escitalopram Oxalate] Other (See Comments)    Weakness    Pollen Extract Hives   Ceftin [Cefuroxime Axetil] Rash   Penicillins Rash   Sulfa Antibiotics Rash    HOME MEDICATIONS:  Current Outpatient Medications:    acetaminophen (TYLENOL) 500 MG tablet, Take 1 tablet (500 mg  total) by mouth every 4 (four) hours as needed., Disp: 30 tablet, Rfl: 0   albuterol (VENTOLIN HFA) 108 (90 Base) MCG/ACT inhaler, Inhale 2 puffs into the lungs every 6 (six) hours as needed., Disp: , Rfl:    amantadine (SYMMETREL) 100 MG capsule, Take 1 capsule (100 mg total) by mouth 2 (two) times daily., Disp: 60 capsule, Rfl: 5   amLODipine (NORVASC) 5 MG tablet, Take 5 mg by mouth daily., Disp: , Rfl:    Baclofen 5 MG TABS, Take 1 tablet by mouth 3 (three) times daily., Disp: , Rfl:    busPIRone (BUSPAR) 15 MG tablet, Take 1 tablet (15 mg total) by mouth 2 (two) times daily., Disp: 60 tablet, Rfl: 5   Cholecalciferol (D3-1000 PO), Take 2,000 Units by mouth daily., Disp: , Rfl:    clopidogrel (PLAVIX) 75 MG tablet, TAKE 1 TABLET (75 MG) BY MOUTH EVERY DAY, Disp: 90 tablet, Rfl: 1   Coenzyme Q10 (CO Q10) 100 MG CAPS,  Take 1 capsule by mouth daily., Disp: , Rfl:    dalfampridine 10 MG TB12, Take 10 mg by mouth every 12 (twelve) hours., Disp: , Rfl:    docusate sodium (COLACE) 100 MG capsule, Take 100 mg by mouth 2 (two) times daily., Disp: , Rfl:    DULoxetine (CYMBALTA) 60 MG capsule, TAKE 1 CAPSULE BY MOUTH DAILY., Disp: 90 capsule, Rfl: 1   fexofenadine (ALLEGRA ALLERGY) 180 MG tablet, Take 1 tablet (180 mg total) by mouth daily., Disp: 30 tablet, Rfl: 2   fluticasone (FLONASE) 50 MCG/ACT nasal spray, Place 1 spray into both nostrils daily., Disp: 16 g, Rfl: 2   furosemide (LASIX) 40 MG tablet, Take 1 tablet (40 mg total) by mouth daily., Disp: 30 tablet, Rfl: 3   ketoconazole (NIZORAL) 2 % cream, SMARTSIG:1 Application Topical Morning-Night, Disp: , Rfl:    losartan (COZAAR) 50 MG tablet, TAKE 1 TABLET BY MOUTH ONCE DAILY, Disp: 90 tablet, Rfl: 1   magnesium oxide (MAG-OX) 400 MG tablet, TAKE 1 TABLET BY MOUTH DAILY, Disp: 30 tablet, Rfl: 5   METAMUCIL FIBER PO, Take by mouth 2 (two) times daily., Disp: , Rfl:    methylPREDNISolone sodium succinate (SOLU-MEDROL) 1000 MG injection, Inject  1,000 mg into the vein every 30 (thirty) days., Disp: , Rfl:    mirtazapine (REMERON SOL-TAB) 30 MG disintegrating tablet, Take 1 tablet (30 mg total) by mouth at bedtime., Disp: 30 tablet, Rfl: 3   nystatin (MYCOSTATIN/NYSTOP) powder, APPLY TO AFFECTED AREAS TWICE A DAY IF NEEDED, Disp: 60 g, Rfl: 0   nystatin cream (MYCOSTATIN), Apply 1 application topically 2 (two) times daily., Disp: 30 g, Rfl: 0   omeprazole (PRILOSEC) 20 MG capsule, Take 20 mg by mouth 2 (two) times daily before a meal., Disp: , Rfl:    rosuvastatin (CRESTOR) 5 MG tablet, Take 1 tablet (5 mg total) by mouth daily., Disp: 90 tablet, Rfl: 1   saccharomyces boulardii (FLORASTOR) 250 MG capsule, Take 1 capsule (250 mg total) by mouth 2 (two) times daily., Disp: 60 capsule, Rfl: 0   SEROQUEL 25 MG tablet, Take 12.5mg  in the morning and 50mg  at night, Disp: 60 tablet, Rfl: 3   sodium chloride (OCEAN) 0.65 % nasal spray, Place 1-2 sprays into the nose as needed., Disp: , Rfl:    Vitamin D, Ergocalciferol, (DRISDOL) 1.25 MG (50000 UNIT) CAPS capsule, Take 1 capsule (50,000 Units total) by mouth every 7 (seven) days., Disp: 7 capsule, Rfl: 0 No current facility-administered medications for this visit.  Facility-Administered Medications Ordered in Other Visits:    0.9 %  sodium chloride infusion, , Intravenous, Continuous, Corcoran, Melissa C, MD, Last Rate: 10 mL/hr at 04/25/16 0855, New Bag at 04/25/16 0855   acetaminophen (TYLENOL) tablet 650 mg, 650 mg, Oral, Once, Corcoran, Drue Second, MD  PAST MEDICAL HISTORY: Past Medical History:  Diagnosis Date   Allergy    Anxiety    Aspiration pneumonia ([place])    Depression    Frequent headaches    H/O   GERD (gastroesophageal reflux disease)    History of chicken pox    History of colon polyps    Hx of migraines    Multiple sclerosis ([place]) 2011   OSA (obstructive sleep apnea)    PONV (postoperative nausea and vomiting)     PAST SURGICAL HISTORY: Past Surgical History:   Procedure Laterality Date   BACK SURGERY     CHOLECYSTECTOMY     COLONOSCOPY WITH PROPOFOL N/A 05/18/2017   Procedure:  COLONOSCOPY WITH PROPOFOL;  Surgeon: [name], MD;  Location: [facility] ENDOSCOPY;  Service: Endoscopy;  Laterality: N/A;   FOOT SURGERY  2015   GALLBLADDER SURGERY  2008   HARDWARE REMOVAL Left 02/14/2016   Procedure: LEFT FOOT REMOVAL DEEP IMPLANT;  Surgeon: [name], MD;  Location: [place];  Service: Orthopedics;  Laterality: Left;   HERNIA REPAIR     Inguinal Hernia Repair   SPINE SURGERY  2014    FAMILY HISTORY: Family History  Problem Relation Age of Onset   Arthritis Mother    Hypertension Mother    Macular degeneration Mother    Hypertension Father    Hyperlipidemia Father    Heart disease Maternal Grandfather    Diabetes Maternal Grandfather    Kidney disease Paternal Grandmother     SOCIAL HISTORY:  Social History   Socioeconomic History   Marital status: Married    Spouse name: Engineer, technical sales   Number of children: 3   Years of education: 14   Highest education level: Associate degree: academic program  Occupational History   Not on file  Tobacco Use   Smoking status: Never   Smokeless tobacco: Never  Vaping Use   Vaping Use: Never used  Substance and Sexual Activity   Alcohol use: No    Alcohol/week: 0.0 standard drinks   Drug use: No   Sexual activity: Not Currently  Other Topics Concern   Not on file  Social History Narrative   Lives with husband    Right handed   Caffeine: 1 cup of coffee in the AM, coke occa. Trying to come off caffeine    Social Determinants of Health   Financial Resource Strain: Low Risk    Difficulty of Paying Living Expenses: Not hard at all  Food Insecurity: No Food Insecurity   Worried About Charity fundraiser in the Last Year: Never true   [place] in the Last Year: Never true  Transportation Needs: No Transportation Needs   Lack of Transportation (Medical): No   Lack of  Transportation (Non-Medical): No  Physical Activity: Not on file  Stress: No Stress Concern Present   Feeling of Stress : Not at all  Social Connections: Unknown   Frequency of Communication with Friends and Family: Not on file   Frequency of Social Gatherings with Friends and Family: Not on file   Attends Religious Services: Not on file   Active Member of Clubs or Organizations: Not on file   Attends Archivist Meetings: Not on file   Marital Status: Married  Human resources officer Violence: Not At Risk   Fear of Current or Ex-Partner: No   Emotionally Abused: No   Physically Abused: No   Sexually Abused: No     PHYSICAL EXAM  Vitals:   09/19/21 1303  BP: 116/76  Pulse: 88  SpO2: 98%  Weight: 172 lb (78 kg)  Height: 5\' 3"  (1.6 m)    Body mass index is 30.47 kg/m.   General: The patient is well-developed and well-nourished and in no acute distress   Eyes:  Funduscopic exam shows normal optic discs and retinal vessels.   Neck: The neck is supple, no carotid bruits are noted.  The neck is nontender.   Cardiovascular: The heart has a regular rate and rhythm with a normal S1 and S2. There were no murmurs, gallops or rubs. Lungs are clear to auscultation.   Skin: Extremities show left greater than right  Musculoskeletal:  Back is nontender   Neurologic Exam   Mental status: She is very anxious.  The patient is alert and oriented x 3 at the time of the examination. The patient has apparent normal recent and remote memory, with an apparently normal attention span and concentration ability.   Speech is normal.   Cranial nerves: Extraocular movements are full. Pupils are equal, round, and reactive to light and accomodation.  Visual fields are full.  Facial symmetry is present. There is good facial sensation to soft touch bilaterally.Facial strength is normal.  Trapezius and sternocleidomastoid strength is normal. No dysarthria is noted.  The tongue is midline, and the  patient has symmetric elevation of the soft palate. No obvious hearing deficits are noted.   Motor:  Muscle bulk is normal.   Tone is increased in the legs, left greater than right. Strength is  5 / 5 in the arms, 4+/5 in the right iliopsoas and 5/5 elsewhere in the right leg, 4 -/5 in the left iliopsoas and 4+/5 elsewhere in that leg   Sensory: Sensory testing is intact to pinprick, soft touch and vibration sensation in all 4 extremities.   Coordination: Cerebellar testing reveals good finger-nose-finger but reduced left heel-to-shin.   Gait and station: She needs bilateral support to stand and has difficulty bearing her weight, especially on the left.  She can take some steps with bilateral support.   Romberg is negative.    Reflexes: Deep tendon reflexes are symmetric and normal in the arm but the left knee reflex is greater than the right and she has spread.  Ankle reflexes were 1+ and symmetric.Marland Kitchen   Plantar responses are flexor.     DIAGNOSTIC DATA (LABS, IMAGING, TESTING) - I reviewed patient records, labs, notes, testing and imaging myself where available.  Lab Results  Component Value Date   WBC 10.6 (H) 08/15/2021   HGB 13.8 08/15/2021   HCT 41.5 08/15/2021   MCV 81.1 08/15/2021   PLT 300.0 08/15/2021      Component Value Date/Time   NA 143 08/15/2021 1134   NA 140 08/09/2018 0000   NA 138 09/18/2014 1048   K 3.7 08/15/2021 1134   K 4.0 09/18/2014 1048   CL 103 08/15/2021 1134   CL 101 09/18/2014 1048   CO2 29 08/15/2021 1134   CO2 27 09/18/2014 1048   GLUCOSE 98 08/15/2021 1134   GLUCOSE 88 09/18/2014 1048   BUN 14 08/15/2021 1134   BUN 7 08/09/2018 0000   BUN 9 09/18/2014 1048   CREATININE 0.67 08/15/2021 1134   CREATININE 0.71 04/11/2020 0923   CALCIUM 10.1 08/15/2021 1134   CALCIUM 9.5 09/18/2014 1048   PROT 7.1 08/15/2021 1134   PROT 7.8 09/18/2014 1048   ALBUMIN 4.8 08/15/2021 1134   ALBUMIN 4.6 03/29/2018 1303   ALBUMIN 4.0 09/18/2014 1048   AST 16  08/15/2021 1134   AST 23 09/18/2014 1048   ALT 18 08/15/2021 1134   ALT 42 09/18/2014 1048   ALKPHOS 91 08/15/2021 1134   ALKPHOS 161 (H) 09/18/2014 1048   BILITOT 0.3 08/15/2021 1134   BILITOT 0.3 09/18/2014 1048   GFRNONAA >60 12/26/2020 0125   GFRNONAA >60 09/18/2014 1048   GFRAA >60 12/20/2017 2130   GFRAA >60 09/18/2014 1048   Lab Results  Component Value Date   CHOL 190 08/15/2021   HDL 94.80 08/15/2021   LDLCALC 70 08/15/2021   LDLDIRECT 127.0 08/17/2020   TRIG 126.0 08/15/2021   CHOLHDL 2 08/15/2021  Lab Results  Component Value Date   HGBA1C 6.3 08/15/2021   Lab Results  Component Value Date   HCWCBJSE83 151 07/19/2021   Lab Results  Component Value Date   TSH 2.780 07/19/2021       ASSESSMENT AND PLAN  MS (multiple sclerosis) ([place]) - Plan: DG BONE DENSITY (DXA)  Gait disturbance  Long term systemic steroid user - Plan: DG BONE DENSITY (DXA)  Depression with anxiety  Edema, unspecified type   In summary, Carly [name] is a 70 year old woman with multiple sclerosis, most likely primary progressive.  She has an unusually large amount of confluent white matter change in the hemispheres.  Foci in the spinal cord are more discrete and consistent with MS.  Additionally oligoclonal bands were present..  She did not respond to Avinger therapy in 2017 and 2018.  I think it is unlikely that any of the other disease modifying therapie would help her primary progressive MS.  Since she does not think she is getting a benefit from the monthly IV steroids we will hold these.  I will check a bone density scan and we could reconsider if, in retrospect, she felt she was having a benefit.  She should continue Ampyra.  I will also add amantadine.  She has anxiety not controlled on her current medications and I will add BuSpar 15 mg p.o. twice daily.  She will return to see me in 5 months or sooner if there are new or worsening neurologic symptoms.  Thank you for asking me  to see Carly [name].  Please let me know if I can be of further assistance with her or other patients in the future.   Sheri Gatchel A. Felecia Shelling, MD, [facility] 76/10/6071, 7:10 PM Certified in Neurology, Clinical Neurophysiology, Sleep Medicine and Neuroimaging  [facility] [address] [place], [place] [address] [phone]

## 2021-09-23 DIAGNOSIS — M79601 Pain in right arm: Secondary | ICD-10-CM | POA: Diagnosis not present

## 2021-09-23 DIAGNOSIS — M7918 Myalgia, other site: Secondary | ICD-10-CM | POA: Diagnosis not present

## 2021-09-23 DIAGNOSIS — R519 Headache, unspecified: Secondary | ICD-10-CM | POA: Diagnosis not present

## 2021-09-23 DIAGNOSIS — M542 Cervicalgia: Secondary | ICD-10-CM | POA: Diagnosis not present

## 2021-09-23 DIAGNOSIS — M5412 Radiculopathy, cervical region: Secondary | ICD-10-CM | POA: Diagnosis not present

## 2021-09-23 DIAGNOSIS — M9901 Segmental and somatic dysfunction of cervical region: Secondary | ICD-10-CM | POA: Diagnosis not present

## 2021-09-23 DIAGNOSIS — M9900 Segmental and somatic dysfunction of head region: Secondary | ICD-10-CM | POA: Diagnosis not present

## 2021-09-23 DIAGNOSIS — M79602 Pain in left arm: Secondary | ICD-10-CM | POA: Diagnosis not present

## 2021-09-23 DIAGNOSIS — M5413 Radiculopathy, cervicothoracic region: Secondary | ICD-10-CM | POA: Diagnosis not present

## 2021-09-24 ENCOUNTER — Ambulatory Visit: Payer: Medicare Other | Admitting: Occupational Therapy

## 2021-09-24 ENCOUNTER — Other Ambulatory Visit: Payer: Self-pay

## 2021-09-24 DIAGNOSIS — R29898 Other symptoms and signs involving the musculoskeletal system: Secondary | ICD-10-CM | POA: Diagnosis not present

## 2021-09-24 DIAGNOSIS — R278 Other lack of coordination: Secondary | ICD-10-CM | POA: Diagnosis not present

## 2021-09-24 DIAGNOSIS — I890 Lymphedema, not elsewhere classified: Secondary | ICD-10-CM

## 2021-09-24 DIAGNOSIS — R262 Difficulty in walking, not elsewhere classified: Secondary | ICD-10-CM | POA: Diagnosis not present

## 2021-09-24 DIAGNOSIS — R2681 Unsteadiness on feet: Secondary | ICD-10-CM | POA: Diagnosis not present

## 2021-09-24 DIAGNOSIS — R2689 Other abnormalities of gait and mobility: Secondary | ICD-10-CM | POA: Diagnosis not present

## 2021-09-24 DIAGNOSIS — R269 Unspecified abnormalities of gait and mobility: Secondary | ICD-10-CM | POA: Diagnosis not present

## 2021-09-24 DIAGNOSIS — M6281 Muscle weakness (generalized): Secondary | ICD-10-CM | POA: Diagnosis not present

## 2021-09-24 NOTE — Therapy (Addendum)
[place] MAIN [facility] SERVICES [address] [place], [place], [address] Phone: [phone]   Fax:  [phone]  Occupational Therapy Treatment  Patient Details  Name: Carly [name] MRN: [id number] Date of Birth: 03-Feb-1951 Referring Provider (OT): Einar Pheasant, MD   Encounter Date: 09/24/2021   OT End of Session - 09/24/21 1456     Visit Number 15    Number of Visits 36    Date for OT Re-Evaluation 12/12/21    OT Start Time 0205    OT Stop Time 0255    OT Time Calculation (min) 50 min    Equipment Utilized During Treatment tyvek slipper sock, friction gloves    Activity Tolerance Patient tolerated treatment well;No increased pain    Behavior During Therapy WFL for tasks assessed/performed             Past Medical History:  Diagnosis Date   Allergy    Anxiety    Aspiration pneumonia (HCC)    Depression    Frequent headaches    H/O   GERD (gastroesophageal reflux disease)    History of chicken pox    History of colon polyps    Hx of migraines    Multiple sclerosis ([place]) 2011   OSA (obstructive sleep apnea)    PONV (postoperative nausea and vomiting)     Past Surgical History:  Procedure Laterality Date   BACK SURGERY     CHOLECYSTECTOMY     COLONOSCOPY WITH PROPOFOL N/A 05/18/2017   Procedure: COLONOSCOPY WITH PROPOFOL;  Surgeon: [name], MD;  Location: [facility] ENDOSCOPY;  Service: Endoscopy;  Laterality: N/A;   FOOT SURGERY  2015   GALLBLADDER SURGERY  2008   HARDWARE REMOVAL Left 02/14/2016   Procedure: LEFT FOOT REMOVAL DEEP IMPLANT;  Surgeon: [name], MD;  Location: [place];  Service: Orthopedics;  Laterality: Left;   HERNIA REPAIR     Inguinal Hernia Repair   SPINE SURGERY  2014    There were no vitals filed for this visit.   Subjective Assessment - 09/24/21 1500     Subjective  Carly [name] presents to OT for Rx visit  15/36 to addressBLE lymphedema (LE). Pt is  accompanied by paid caregiver.  Pt denies pain in legs associated with LE. Pt wears recommended compression stockings to clinic, and brings 1 size large to try for improved comfort and fit.    Patient is accompanied by: Family member    Pertinent History Relevant to limb swelling: Depression /anxiety, MS, HTN. 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 weakness,    Special Tests Intake FOTO: 29/100 (functional outcome score)    Patient Stated Goals to be able to walk better, to get this swelling down. I hate it.    Pain Onset Other (comment)   leg pain and swelling first noticed during long care trip ~ 7 hours                         OT Treatments/Exercises (OP) - 09/24/21 1501       ADLs   ADL Education Given Yes      Manual Therapy   Manual Therapy Edema management    Compression Bandaging BLE ccl 2 OTS Juzo size IV knee highs- open toe, silicone band  OT Education - 09/24/21 1502     Education Details Continued Pt/ CG edu for lymphedema self care  and home program throughout session. Topics include progress towards OT goals to date, multilayer compression wrapping, simple self-MLD, therapeutic lymphatic pumping exercises, skin/nail care, LE risk reduction factors an precautions, compression garments/recommendations and wear and care schedule and compression garment donning / doffing using assistive devices. All questions answered to the Pt's satisfaction, and Pt demonstrates understanding by report.    Person(s) Educated Patient;Spouse;Caregiver(s)    Methods Explanation;Demonstration;Handout;Verbal cues    Comprehension Verbalized understanding;Returned demonstration;Need further instruction;Verbal cues required;Tactile cues required                 OT Long  Term Goals - 09/13/21 1029       OT LONG TERM GOAL #1   Title Given this patients risk-adjustment variables, her Intake Functional Status score of 29/100 on the FOTO tool, patient will experience at least an increase in function of 3 points for a score of 32/100.    Baseline 97/100    Time 12    Period Weeks    Status On-going    Target Date 12/12/21      OT LONG TERM GOAL #2   Title Pt will be able to verbalize at least 4 lymphedema precautions and prevention strategies with Max caregiver assist to reduce infection risk limit progression of lymphedema.    Baseline dependent    Time 6    Period Days    Status Achieved   In progress. Pt and spouse have short term memory issues that is delaying this goal.We continue to review each session, also with paid caregivers.     OT LONG TERM GOAL #3   Title Pt will be able to apply multi-layer, short stretch compression wraps to one leg at a time using correct gradient techniques with max assistance in an effort  to return the affected  limb(s)  to premorbid size and shape, to limit pain and infection risk, and to improve functional mobility and ambulation  for ADLs.    Baseline dependent    Time 6    Period Days    Status Achieved      OT LONG TERM GOAL #4   Title Pt will achieve at least a 10% limb volume reduction bilaterally to limit lymphedema progression, to improve tissue health and limit infection risk, and to increase tissue flexibility essential for optimal AROM and safe functional ambulation and mobility.    Baseline dependent    Time 12    Period Weeks    Status Partially Met   By visual assessment I estimate Pt has achieved at least a 10% limb volume reduction in the LLE below the knee. Garments have been ordered . will assess w formal volumetrics next session.     OT LONG TERM GOAL #5   Title With Max caregiver assistance Pt will achieve and sustain a least 85% compliance with all LE self-care home program components throughout  Intensive Phase CDT, including frequent elevation when seated, daily skin inspection and care, lymphatic pumping ther ex, 23/7 compression wraps and simple self-MLD, to sustain clinical gains made in CDT and to limit lymphedema progression and further functional decline.    Baseline dependent    Time 12    Period Weeks    Status Achieved      OT LONG TERM GOAL #6   Title Using assistive devices (modified independence)  and Max CG assistance Pt will be  able to don and doff appropriate daytime compression garments  to limit lymphatic re-accumulation and LE progression before transitioning to self-management phase of CDT.    Baseline dependent    Time 12    Period Weeks    Status Achieved                   Plan - 09/24/21 1456     Clinical Impression Statement Carly [name] trialed and failed compression, elevation and exercise for over 4 weeks. Fitted 1 size larger off the shelf, circular knit, Juzo knee highs which appear to provice improved fit at top edge and ankles- BLE ccl 2 OTS Juzo size IV knee highs- open toe, silicone band.  Pt will wear both sizes and compare comfort and edema control. Plan to complete assessment and likely reduce to follow up frequency     while transitioning Pt to self-management phase of CDT. Reviewed proper donning and garment positioning with paid Caregiver . Pt has also order closed soe as suggested. We';ll see her back  after her holiday vacation.    OT Occupational Profile and History Comprehensive Assessment- Review of records and extensive additional review of physical, cognitive, psychosocial history related to current functional performance    Occupational performance deficits (Please refer to evaluation for details): ADL's;IADL's;Work;Leisure;Social Participation;Other   body image   Body Structure / Function / Physical Skills ADL;Edema;Skin integrity;Pain;Decreased knowledge of precautions;Decreased knowledge of use of DME;IADL    Rehab Potential  Fair    Clinical Decision Making Multiple treatment options, significant modification of task necessary    Comorbidities Affecting Occupational Performance: Presence of comorbidities impacting occupational performance    Comorbidities impacting occupational performance description: See SUBJECTIVE for relevant Dx    Modification or Assistance to Complete Evaluation  Max significant modification of tasks or assist is necessary to complete    OT Frequency 2x / week    OT Duration 12 weeks   and PRN for follow along support and caregiver support   OT Treatment/Interventions Self-care/ADL training;Therapeutic exercise;Coping strategies training;Therapeutic activities;Manual lymph drainage;Energy conservation;Manual Therapy;Other (comment);DME and/or AE instruction;Compression bandaging;Patient/family education   skin care to limit infection risk and increase skin flexibility   Plan Comple Decongestive Therapy: Manual Lymphatic Drainage (MLD, skin care, therapeutic exercise, compression therapy using short stretch    wraps and compression garments/ devices to retain clinical gains    Recommended Other Services Consider trial with advanced sequential pneumatic compression device, or "pump". Flexitouch device recommended over all as this stimulates lymphatics proximally to distal duplicating anatomic function. Need MD clearance due to Hx of CVA and MS. Pt may not tolerate 2/2 anxiety, but will explore her interest. Consider alternative to Amlodipine due to limb swelling side effect.    Consulted and Agree with Plan of Care Family member/caregiver;Patient    Family Member Consulted Spouse, Bill             Patient will benefit from skilled therapeutic intervention in order to improve the following deficits and impairments:   Body Structure / Function / Physical Skills: ADL, Edema, Skin integrity, Pain, Decreased knowledge of precautions, Decreased knowledge of use of DME, IADL       Visit  Diagnosis: Lymphedema, not elsewhere classified    Problem List Patient Active Problem List   Diagnosis Date Noted   Pre-op evaluation 08/31/2021   Rash 01/27/2021   History of CVA (cerebrovascular accident) 01/05/2021   PBA (pseudobulbar affect)    MS (multiple sclerosis) ([place])    Anxiety  and depression    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   Generalized anxiety disorder 12/11/2020   Acute focal neurological deficit 12/08/2020   Edema 09/28/2020   Bloating 08/25/2020   Environmental allergies 07/17/2019   Hyperglycemia 07/17/2019   Cough 06/06/2019   Memory change 05/29/2019   Abnormal liver function tests 01/09/2019   Hemorrhoid 09/27/2018   Nausea 08/11/2018   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   Depression with anxiety 02/04/2018   Cognitive and behavioral changes 02/04/2018   Constipation 11/17/2017   Hx of adenomatous colonic polyps 06/06/2016   Lower extremity edema 01/13/2016   Bilateral ovarian cysts 10/16/2015   Health care maintenance 10/16/2015   Colon polyp 01/25/2015   Bone/cartilage disorder 01/25/2015   Headache 11/18/2014   Depression, recurrent ([place]) 11/18/2014   Greater tuberosity of humerus fracture 11/18/2014   Multiple sclerosis ([place]) 11/18/2014   Unsteady gait 11/18/2014   Dizziness 11/18/2014   Inconclusive mammogram 03/15/2013   S/P lumbar spinal fusion 01/04/2013   Surgery, other elective 12/03/2012   DDD (degenerative disc disease), lumbosacral 10/15/2012   Back ache 09/06/2012   Hypertension 09/06/2012   Back pain 09/06/2012   Hypercholesterolemia 01/28/2012   Climacteric 01/28/2012   Routine general medical examination at a health care facility 01/28/2012   Avitaminosis D 01/28/2012    Andrey Spearman, MS, OTR/L, [employer] 09/24/21 3:03 PM    [place] MAIN [facility] SERVICES [address] [place], [place], [address] Phone: [phone]   Fax:  [phone]  Name: Carly [name] MRN: [id number] Date of Birth: 1951/04/17

## 2021-09-25 ENCOUNTER — Ambulatory Visit: Payer: Medicare Other

## 2021-09-25 DIAGNOSIS — R2681 Unsteadiness on feet: Secondary | ICD-10-CM

## 2021-09-25 DIAGNOSIS — R262 Difficulty in walking, not elsewhere classified: Secondary | ICD-10-CM

## 2021-09-25 DIAGNOSIS — R278 Other lack of coordination: Secondary | ICD-10-CM | POA: Diagnosis not present

## 2021-09-25 DIAGNOSIS — R2689 Other abnormalities of gait and mobility: Secondary | ICD-10-CM | POA: Diagnosis not present

## 2021-09-25 DIAGNOSIS — M6281 Muscle weakness (generalized): Secondary | ICD-10-CM

## 2021-09-25 DIAGNOSIS — R269 Unspecified abnormalities of gait and mobility: Secondary | ICD-10-CM

## 2021-09-25 NOTE — Therapy (Signed)
[place] MAIN [facility] SERVICES [address] [place], [place], [address] Phone: [phone]   Fax:  [phone]  Physical Therapy Treatment/Physical Therapy Progress Note   Dates of reporting period  08/08/2021  to   09/25/2021  Patient Details  Name: Carly [name] MRN: [id number] Date of Birth: December 09, 1950 Referring Provider (PT): Einar Pheasant MD   Encounter Date: 09/25/2021   PT End of Session - 09/25/21 1105     Visit Number 10    Number of Visits 24    Date for PT Re-Evaluation 10/31/21    Authorization Time Period 08/08/21-10/31/20    Progress Note Due on Visit 20    PT Start Time 1055    PT Stop Time 1145    PT Time Calculation (min) 50 min    Equipment Utilized During Treatment Gait belt    Activity Tolerance Patient tolerated treatment well    Behavior During Therapy WFL for tasks assessed/performed             Past Medical History:  Diagnosis Date   Allergy    Anxiety    Aspiration pneumonia ([place])    Depression    Frequent headaches    H/O   GERD (gastroesophageal reflux disease)    History of chicken pox    History of colon polyps    Hx of migraines    Multiple sclerosis ([place]) 2011   OSA (obstructive sleep apnea)    PONV (postoperative nausea and vomiting)     Past Surgical History:  Procedure Laterality Date   BACK SURGERY     CHOLECYSTECTOMY     COLONOSCOPY WITH PROPOFOL N/A 05/18/2017   Procedure: COLONOSCOPY WITH PROPOFOL;  Surgeon: [name], MD;  Location: [facility] ENDOSCOPY;  Service: Endoscopy;  Laterality: N/A;   FOOT SURGERY  2015   GALLBLADDER SURGERY  2008   HARDWARE REMOVAL Left 02/14/2016   Procedure: LEFT FOOT REMOVAL DEEP IMPLANT;  Surgeon: [name], MD;  Location: [place];  Service: Orthopedics;  Laterality: Left;   HERNIA REPAIR     Inguinal Hernia Repair   SPINE SURGERY  2014    There were no vitals filed for this visit.   Subjective Assessment -  09/25/21 2247     Subjective Patient reports doing okay. States she has been doing her HEP and caregiver agrees. Patient denies any pain or falls.    Patient is accompained by: Family member    Pertinent History Pt reports she is in PT on order to allow for improvement with walking. Pt reports she has not been able to get [facility] PT because she is being seen by outpatient OT services. Pt reports she was walking  and her caregiver who helped her walk had a heart attack. Pt reports the last time she was walking regularly was about 2 years ago. She reports she gets up to [facility] with some assistance from her husband. Pt has walker, rollator and WC at home. Pt reports she used them often until her caregiver had the heart attack. Pt switched to new MS medication a couple years ago and attributes some of her dificits to the medicaiton change. Pt has significant difficulty with any activities involving standing. Pt lives in home wiht ramp to enter. House is one story with an upstairs but she lives primarilly on the first floor. Pt has shower which she can access. 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 She has weighted bracelets she has  been using for arm strength. Pt reports she has not been to outpatient PT in the past but she has had [facility] in the past. Pt reports swimming in past for exercise. She also reports she is moving to [place] [facility] in January.    Limitations Standing;Walking    How long can you stand comfortably? 15 min.    How long can you walk comfortably? 10 min.    Patient Stated Goals Standing, walking, transfers    Currently in Pain? No/denies    Pain Onset Other (comment)   leg pain and swelling first noticed during long care trip ~ 7 hours            INTERVENTIONS:   Reassessed STG and LTG for progress note today.   STG:  Patient reports compliant wth HEP consisting of LE strengthening. (GOAL MET)   LTG:  -Patient does report compliance with initial HEP consisting  on seated therex for LE strengthening. (Ongoing) - No falls reported and patient has been focusing on walking  some in clinic. (Met) -Patient continues to require repetitive VC, TC, and min physical assistance to stand as wel as B UE Support (Ongoing)  -New goal -10 MWT= 0.045 m/s using front wheeled walker -New goal- Patient will ambulate on level surfaces using front wheeled walker with CGA > 200 feet without rest break or loss of balance for improved ability for household ambulation.      Patient was able to walk 32 feet today with front wheeled walker, Min assist with close w/c follow with decreased heel to toe and decreased step length   Sit to stand x 6 trials with Min assist and max repetitive VC for technique for hand position and to lean forward.   Stand pivot transfer training- Positioned chair perpendicular to wheelchair and patient perform SPT using a front wheeled walker and PT assisted to/from w/c to straight back chair - Min assist and max VC required for safety.                   PT Education - 09/25/21 2252     Education Details Purpose of functional outcomes. Transfer and gait safety education    Person(s) Educated Patient;Caregiver(s)    Methods Explanation;Demonstration;Tactile cues;Verbal cues    Comprehension Verbalized understanding;Returned demonstration;Verbal cues required;Need further instruction;Tactile cues required              PT Short Term Goals - 09/25/21 1106       PT SHORT TERM GOAL #1   Title Patient will be independent in home exercise program to improve strength/mobility for better functional independence with ADLs.    Baseline Pt does not have HEP; 09/25/2021- Patient reports compliant wth HEP consisting of LE strengthening.    Time 4    Period Weeks    Status Achieved    Target Date 09/05/21      PT SHORT TERM GOAL #2   Title Patient will increase FOTO score to equal to or greater than 4    to demonstrate statistically  significant improvement in mobility and quality of life.    Baseline 28; 09/25/2021- will reassess next visit    Time 4    Period Weeks    Target Date 09/05/21               PT Long Term Goals - 09/25/21 1108       PT LONG TERM GOAL #1   Title Patient will be independent with advanced and  progressive home program for strength and endurance in order to transition to self management    Baseline Pt does not have current formal HEP; 09/25/2021- Patient does report compliance with initial HEP consisting on seated therex for LE strengthening.    Time 4    Period Weeks    Status On-going    Target Date 10/03/21      PT LONG TERM GOAL #2   Title pt's FOTO score will improve by 4 points or more indicating improved confidence with daily tasks at home    Baseline 28 at intake: 09/25/2021- Will assess next visit    Time 6    Period Weeks    Status New      PT LONG TERM GOAL #3   Title Patient will deny any falls over past 6 weeks to demonstrate improved safety awareness at home and work.    Baseline Pt reports 3 falls in previous 6 months; 09/25/2021= No falls reported and patient has been focusing on walking  some in clinic.    Time 6    Period Weeks    Status Achieved      PT LONG TERM GOAL #4   Title Patient will perform sit to and from stand from her wheelchair with min assist and no upper extremity support in order to indicate improvement in function.    Baseline Patient requires mod assist from author and bilateral lower extremity support to transition to and from sitting. 09/25/2021- Patient continues to require repetitive VC, TC, and min physical assistance to stand as wel as B UE Support.    Time 12    Period Weeks    Status On-going    Target Date 10/31/21      PT LONG TERM GOAL #5   Title Pt will increase 10MWT by at least 0.13 m/s in order to demonstrate clinically significant improvement in community ambulation.    Baseline 09/25/2021=10 MWT= 0.045 m/s using front  wheeled walker    Time 5    Period Weeks    Status New    Target Date 10/31/21      Additional Long Term Goals   Additional Long Term Goals Yes      PT LONG TERM GOAL #6   Title Patient will ambulate on level surfaces using front wheeled walker with CGA > 200 feet without rest break or loss of balance for improved ability for household ambulation.    Baseline 09/25/2021= Patient was able to walk 32 feet today with front wheeled walker, Min assist with close w/c follow with decreased heel to toe and decreased step length.    Time 5    Period Weeks    Status New    Target Date 10/31/21                   Plan - 09/25/21 2255     Clinical Impression Statement Patient presents with good motivation for activities today and has demonstrated good overall progress since initiation of PT services. She (with the assist of caregiver) have reported compliance with HEP for LE strengthening. She has improved with overall ability to transfer but still will benefit from continued training to improve her overall independence. She is now able to walk some with assist as previously unable. New goals were added to reflect her improvement with functional mobility. Patient's condition has the potential to improve in response to therapy. Maximum improvement is yet to be obtained. The anticipated improvement is attainable and reasonable in  a generally predictable time.   The pt will benefit from further skilled PT to improve strength, gait, mobility, and activity tolerance    Personal Factors and Comorbidities Age;Comorbidity 1;Comorbidity 2;Comorbidity 3+;Time since onset of injury/illness/exacerbation    Comorbidities Depression, anxiety, HTN, MS, DDD, GERD    Examination-Activity Limitations Bed Mobility;Carry;Continence;Dressing;Hygiene/Grooming;Lift;Locomotion Level;Squat;Stairs;Stand;Toileting;Transfers    Examination-Participation Restrictions Cleaning;Community Activity;Laundry;Meal Prep;Shop     Stability/Clinical Decision Making Evolving/Moderate complexity    Rehab Potential Fair    Clinical Impairments Affecting Rehab Potential (+) family support, motivated (-) MS, decreased confidence, co morbidities    PT Frequency 2x / week    PT Duration 12 weeks    PT Treatment/Interventions Patient/family education;Gait training;Neuromuscular re-education;Therapeutic exercise;Manual techniques;ADLs/Self Care Home Management;Stair training;DME Instruction;Functional mobility training;Therapeutic activities;Balance training;Wheelchair mobility training;Energy conservation;Joint Manipulations    PT Next Visit Plan gait in // bars, strength, endurance; continue POC as previously indicated    PT Home Exercise Plan Access Code: [email or web address]; no updates    Consulted and Agree with Plan of Care Patient             Patient will benefit from skilled therapeutic intervention in order to improve the following deficits and impairments:  Decreased strength, Decreased balance, Decreased activity tolerance, Decreased endurance, Difficulty walking, Abnormal gait, Improper body mechanics, Decreased mobility, Hypomobility  Visit Diagnosis: Abnormality of gait and mobility  Difficulty in walking, not elsewhere classified  Muscle weakness (generalized)  Unsteadiness on feet     Problem List Patient Active Problem List   Diagnosis Date Noted   Pre-op evaluation 08/31/2021   Rash 01/27/2021   History of CVA (cerebrovascular accident) 01/05/2021   PBA (pseudobulbar affect)    MS (multiple sclerosis) (HCC)    Anxiety and depression    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   Generalized anxiety disorder 12/11/2020   Acute focal neurological deficit 12/08/2020   Edema 09/28/2020   Bloating 08/25/2020   Environmental allergies 07/17/2019   Hyperglycemia 07/17/2019   Cough 06/06/2019   Memory change 05/29/2019   Abnormal liver function tests 01/09/2019   Hemorrhoid 09/27/2018    Nausea 08/11/2018   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   Depression with anxiety 02/04/2018   Cognitive and behavioral changes 02/04/2018   Constipation 11/17/2017   Hx of adenomatous colonic polyps 06/06/2016   Lower extremity edema 01/13/2016   Bilateral ovarian cysts 10/16/2015   Health care maintenance 10/16/2015   Colon polyp 01/25/2015   Bone/cartilage disorder 01/25/2015   Headache 11/18/2014   Depression, recurrent ([place]) 11/18/2014   Greater tuberosity of humerus fracture 11/18/2014   Multiple sclerosis ([place]) 11/18/2014   Unsteady gait 11/18/2014   Dizziness 11/18/2014   Inconclusive mammogram 03/15/2013   S/P lumbar spinal fusion 01/04/2013   Surgery, other elective 12/03/2012   DDD (degenerative disc disease), lumbosacral 10/15/2012   Back ache 09/06/2012   Hypertension 09/06/2012   Back pain 09/06/2012   Hypercholesterolemia 01/28/2012   Climacteric 01/28/2012   Routine general medical examination at a health care facility 01/28/2012   Avitaminosis D 01/28/2012    Lewis Moccasin, PT 09/26/2021, 6:55 AM  [place] [address] [place], [place], [address] Phone: [phone]   Fax:  [phone]  Name: Carly [name] MRN: [id number] Date of Birth: 1951-03-25

## 2021-09-26 ENCOUNTER — Other Ambulatory Visit: Payer: Self-pay

## 2021-09-26 ENCOUNTER — Ambulatory Visit: Payer: Medicare Other | Admitting: Physical Therapy

## 2021-09-26 ENCOUNTER — Encounter: Payer: Self-pay | Admitting: Physical Therapy

## 2021-09-26 DIAGNOSIS — M6281 Muscle weakness (generalized): Secondary | ICD-10-CM | POA: Diagnosis not present

## 2021-09-26 DIAGNOSIS — R2689 Other abnormalities of gait and mobility: Secondary | ICD-10-CM | POA: Diagnosis not present

## 2021-09-26 DIAGNOSIS — R269 Unspecified abnormalities of gait and mobility: Secondary | ICD-10-CM

## 2021-09-26 DIAGNOSIS — R262 Difficulty in walking, not elsewhere classified: Secondary | ICD-10-CM

## 2021-09-26 DIAGNOSIS — R2681 Unsteadiness on feet: Secondary | ICD-10-CM | POA: Diagnosis not present

## 2021-09-26 DIAGNOSIS — R278 Other lack of coordination: Secondary | ICD-10-CM

## 2021-09-26 NOTE — Therapy (Signed)
[place] [facility] SERVICES [address] [place], [place], [address] Phone: [phone]   Fax:  [phone]  Physical Therapy Treatment  Patient Details  Name: Carly [name] MRN: [id number] Date of Birth: 1951/03/11 Referring Provider (PT): Einar Pheasant MD   Encounter Date: 09/26/2021   PT End of Session - 09/26/21 1204     Visit Number 11    Number of Visits 24    Date for PT Re-Evaluation 10/31/21    Authorization Time Period 08/08/21-10/31/20    Progress Note Due on Visit 20    PT Start Time 1024    PT Stop Time 1108    PT Time Calculation (min) 44 min    Equipment Utilized During Treatment Gait belt    Activity Tolerance Patient tolerated treatment well    Behavior During Therapy WFL for tasks assessed/performed             Past Medical History:  Diagnosis Date   Allergy    Anxiety    Aspiration pneumonia ([place])    Depression    Frequent headaches    H/O   GERD (gastroesophageal reflux disease)    History of chicken pox    History of colon polyps    Hx of migraines    Multiple sclerosis ([place]) 2011   OSA (obstructive sleep apnea)    PONV (postoperative nausea and vomiting)     Past Surgical History:  Procedure Laterality Date   BACK SURGERY     CHOLECYSTECTOMY     COLONOSCOPY WITH PROPOFOL N/A 05/18/2017   Procedure: COLONOSCOPY WITH PROPOFOL;  Surgeon: [name], MD;  Location: [facility] ENDOSCOPY;  Service: Endoscopy;  Laterality: N/A;   FOOT SURGERY  2015   GALLBLADDER SURGERY  2008   HARDWARE REMOVAL Left 02/14/2016   Procedure: LEFT FOOT REMOVAL DEEP IMPLANT;  Surgeon: [name], MD;  Location: [facility];  Service: Orthopedics;  Laterality: Left;   HERNIA REPAIR     Inguinal Hernia Repair   SPINE SURGERY  2014    There were no vitals filed for this visit.   Subjective Assessment - 09/26/21 1028     Subjective Patient reports wanting to work on stairs. She has a ramp but  would like to be able to negotiate steps to get in through the garage. She reports trying to go down the stairs this morning and her left leg buckled. She reports this is the first time she has tried the stairs in a while.    Patient is accompained by: Family member    Pertinent History Pt reports she is in PT on order to allow for improvement with walking. Pt reports she has not been able to get [facility] PT because she is being seen by outpatient OT services. Pt reports she was walking  and her caregiver who helped her walk had a heart attack. Pt reports the last time she was walking regularly was about 2 years ago. She reports she gets up to [facility] with some assistance from her husband. Pt has walker, rollator and WC at home. Pt reports she used them often until her caregiver had the heart attack. Pt switched to new MS medication a couple years ago and attributes some of her dificits to the medicaiton change. Pt has significant difficulty with any activities involving standing. Pt lives in home wiht ramp to enter. House is one story with an upstairs but she lives primarilly on the first floor. Pt has shower  which she can access. 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 She has weighted bracelets she has been using for arm strength. Pt reports she has not been to outpatient PT in the past but she has had [place] in the past. Pt reports swimming in past for exercise. She also reports she is moving to [facility] in January.    Limitations Standing;Walking    How long can you stand comfortably? 15 min.    How long can you walk comfortably? 10 min.    Patient Stated Goals Standing, walking, transfers    Currently in Pain? No/denies    Pain Onset Other (comment)   leg pain and swelling first noticed during long care trip ~ 7 hours   Multiple Pain Sites No                [facility] PT Assessment - 09/26/21 [place] entrance;Stairs to enter    Entrance  Stairs-Number of Steps 4    Entrance Stairs-Rails Right              TREATMENT:  Patient educated in stand pivot transfers using RW She required cues for hand placement and positioning  X2 reps  Patient required min A; She had a  harder time transferring to left side with difficulty lifting LLE  Patient instructed in sit<>Stand from elevated mat table, with RW, CGA with cues for hand placement and forward weight shift She was instructed in sit<>stand transfer from lower mat table, using RW, requiring min A with increased difficulty pushing through LE  Patient sitting in wheelchair: 2# ankle weight LAQ x10 reps each LE Hip flexion march x10 reps each LE Required cues to increase AROM. Patient exhibits decreased ROM on LLE with increased stiffness noted. Patient reports feeling increased swelling in LLE  PT instructed patient in hooklying exercise She required min A to transfer wheelchair to mat table Patient required min A to transition sit to supine  Patient hooklying: Instructed patient in exercise for lymphatic drainage and LE ROM Diaphragmatic breathing x5 reps Posterior pelvic tilt x5 reps Instructed patient in LE heel slides x10 reps each LE, required AAROM on LLE LE hip abduction/ER x10 reps each LE requiring min A on LLE Instructed patient in LE hip abduction/adduction to midline SLR x10 reps each, AAROM on LLE with cues to keep LE resting on mat table for better hip activation  Patient reports increased heaviness in LLE and difficulty with all exercise. With increased repetition, patient expressed, "I need to be doing these each day. I had forgotten about them, but will do them this afternoon." Patient expressed frustration over limited mobility this session as well as concern about upcoming trip to [facility]  Reinforced HEP, recommend patient increase ROM exercise to promote fluid reduction and improve strength and mobility  Patient required min A to transfer  stand pivot mat table to wheelchair. She required increased time/effort with difficulty lifting LLE;                      PT Education - 09/26/21 1203     Education provided Yes    Education Details Positioning/strengthening, HEP reinforced; transfers;    Person(s) Educated Patient;Caregiver(s)    Methods Explanation;Demonstration;Verbal cues    Comprehension Verbalized understanding;Returned demonstration;Verbal cues required;Need further instruction              PT Short Term  Goals - 09/25/21 1106       PT SHORT TERM GOAL #1   Title Patient will be independent in home exercise program to improve strength/mobility for better functional independence with ADLs.    Baseline Pt does not have HEP; 09/25/2021- Patient reports compliant wth HEP consisting of LE strengthening.    Time 4    Period Weeks    Status Achieved    Target Date 09/05/21      PT SHORT TERM GOAL #2   Title Patient will increase FOTO score to equal to or greater than 4    to demonstrate statistically significant improvement in mobility and quality of life.    Baseline 28; 09/25/2021- will reassess next visit    Time 4    Period Weeks    Target Date 09/05/21               PT Long Term Goals - 09/25/21 1108       PT LONG TERM GOAL #1   Title Patient will be independent with advanced and progressive home program for strength and endurance in order to transition to self management    Baseline Pt does not have current formal HEP; 09/25/2021- Patient does report compliance with initial HEP consisting on seated therex for LE strengthening.    Time 4    Period Weeks    Status On-going    Target Date 10/03/21      PT LONG TERM GOAL #2   Title pt's FOTO score will improve by 4 points or more indicating improved confidence with daily tasks at home    Baseline 28 at intake: 09/25/2021- Will assess next visit    Time 6    Period Weeks    Status New      PT LONG TERM GOAL #3   Title  Patient will deny any falls over past 6 weeks to demonstrate improved safety awareness at home and work.    Baseline Pt reports 3 falls in previous 6 months; 09/25/2021= No falls reported and patient has been focusing on walking  some in clinic.    Time 6    Period Weeks    Status Achieved      PT LONG TERM GOAL #4   Title Patient will perform sit to and from stand from her wheelchair with min assist and no upper extremity support in order to indicate improvement in function.    Baseline Patient requires mod assist from author and bilateral lower extremity support to transition to and from sitting. 09/25/2021- Patient continues to require repetitive VC, TC, and min physical assistance to stand as wel as B UE Support.    Time 12    Period Weeks    Status On-going    Target Date 10/31/21      PT LONG TERM GOAL #5   Title Pt will increase 10MWT by at least 0.13 m/s in order to demonstrate clinically significant improvement in community ambulation.    Baseline 09/25/2021=10 MWT= 0.045 m/s using front wheeled walker    Time 5    Period Weeks    Status New    Target Date 10/31/21      Additional Long Term Goals   Additional Long Term Goals Yes      PT LONG TERM GOAL #6   Title Patient will ambulate on level surfaces using front wheeled walker with CGA > 200 feet without rest break or loss of balance for improved ability for household ambulation.  Baseline 09/25/2021= Patient was able to walk 32 feet today with front wheeled walker, Min assist with close w/c follow with decreased heel to toe and decreased step length.    Time 5    Period Weeks    Status New    Target Date 10/31/21                   Plan - 09/26/21 1204     Clinical Impression Statement Patient motivated and participated fair within session. She reports increased swelling in LLE. She also reports being frustrated that she tried to go down stairs at home and her LLE buckled. Patient expressed desire to work on  steps for increased ease getting in /out of home. Patient initially denies any LLE pain. However during session she reports her leg "was really hurting" but then when therapist questioned patient she backtracked and said, "its just really heavy". PT educated patient/caregiver in stand pivot and sit<>Stand transfers. She requires cues for hand placement and positioning. Patient had a harder time moving LLE. PT instructed patient in seated exercise. She required cues for increased AROM. Patient exhibits increased swelling. PT instructed patient in hooklying LE exercise for increased flexibility and lymphatic drainage. Patient expressed she has not been doing exercise as she forgot about them. Reinforced HEP. She would benefit from additional skilled PT intervention to improve strength, balance and mobility;    Personal Factors and Comorbidities Age;Comorbidity 1;Comorbidity 2;Comorbidity 3+;Time since onset of injury/illness/exacerbation    Comorbidities Depression, anxiety, HTN, MS, DDD, GERD    Examination-Activity Limitations Bed Mobility;Carry;Continence;Dressing;Hygiene/Grooming;Lift;Locomotion Level;Squat;Stairs;Stand;Toileting;Transfers    Examination-Participation Restrictions Cleaning;Community Activity;Laundry;Meal Prep;Shop    Stability/Clinical Decision Making Evolving/Moderate complexity    Rehab Potential Fair    Clinical Impairments Affecting Rehab Potential (+) family support, motivated (-) MS, decreased confidence, co morbidities    PT Frequency 2x / week    PT Duration 12 weeks    PT Treatment/Interventions Patient/family education;Gait training;Neuromuscular re-education;Therapeutic exercise;Manual techniques;ADLs/Self Care Home Management;Stair training;DME Instruction;Functional mobility training;Therapeutic activities;Balance training;Wheelchair mobility training;Energy conservation;Joint Manipulations    PT Next Visit Plan gait in // bars, strength, endurance; continue POC as  previously indicated    PT Home Exercise Plan Access Code: [email or web address]; no updates    Consulted and Agree with Plan of Care Patient             Patient will benefit from skilled therapeutic intervention in order to improve the following deficits and impairments:  Decreased strength, Decreased balance, Decreased activity tolerance, Decreased endurance, Difficulty walking, Abnormal gait, Improper body mechanics, Decreased mobility, Hypomobility  Visit Diagnosis: Abnormality of gait and mobility  Difficulty in walking, not elsewhere classified  Muscle weakness (generalized)  Unsteadiness on feet  Other abnormalities of gait and mobility  Other lack of coordination     Problem List Patient Active Problem List   Diagnosis Date Noted   Pre-op evaluation 08/31/2021   Rash 01/27/2021   History of CVA (cerebrovascular accident) 01/05/2021   PBA (pseudobulbar affect)    MS (multiple sclerosis) ([place])    Anxiety and depression    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   Generalized anxiety disorder 12/11/2020   Acute focal neurological deficit 12/08/2020   Edema 09/28/2020   Bloating 08/25/2020   Environmental allergies 07/17/2019   Hyperglycemia 07/17/2019   Cough 06/06/2019   Memory change 05/29/2019   Abnormal liver function tests 01/09/2019   Hemorrhoid 09/27/2018   Nausea 08/11/2018   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   Depression with anxiety 02/04/2018  Cognitive and behavioral changes 02/04/2018   Constipation 11/17/2017   Hx of adenomatous colonic polyps 06/06/2016   Lower extremity edema 01/13/2016   Bilateral ovarian cysts 10/16/2015   Health care maintenance 10/16/2015   Colon polyp 01/25/2015   Bone/cartilage disorder 01/25/2015   Headache 11/18/2014   Depression, recurrent ([place]) 11/18/2014   Greater tuberosity of humerus fracture 11/18/2014   Multiple sclerosis ([place]) 11/18/2014   Unsteady gait 11/18/2014   Dizziness 11/18/2014    Inconclusive mammogram 03/15/2013   S/P lumbar spinal fusion 01/04/2013   Surgery, other elective 12/03/2012   DDD (degenerative disc disease), lumbosacral 10/15/2012   Back ache 09/06/2012   Hypertension 09/06/2012   Back pain 09/06/2012   Hypercholesterolemia 01/28/2012   Climacteric 01/28/2012   Routine general medical examination at a health care facility 01/28/2012   Avitaminosis D 01/28/2012    Giordana Weinheimer, PT, DPT 09/26/2021, 12:19 PM  [place], [place], [address] Phone: [phone]   Fax:  [phone]  Name: Carly [name] MRN: [id number] Date of Birth: 05/22/1951

## 2021-09-27 ENCOUNTER — Other Ambulatory Visit: Payer: Self-pay | Admitting: Internal Medicine

## 2021-10-01 ENCOUNTER — Ambulatory Visit: Payer: Medicare Other | Admitting: Physical Therapy

## 2021-10-02 ENCOUNTER — Ambulatory Visit: Payer: Medicare Other | Admitting: Physical Therapy

## 2021-10-02 ENCOUNTER — Telehealth: Payer: Self-pay | Admitting: Internal Medicine

## 2021-10-02 NOTE — Telephone Encounter (Signed)
Pt called in stating she thinks she have Covid. Pt stated that yesterday she started having stomach cramps and weakness. Pt stated that today she have more weakness and low grade fever. Pt stated that she doesn't want to go to urgent care because she is to weak to go inside. Pt stated she would like to go some where to get tested at a pull up location. Transfer Pt to access nurse. Access nurse Sonia Side wouldn't take call. Access Nurse Sonia Side advise me to advise pt that a nurse will call her when a nurse is available. Advise pt of the information.

## 2021-10-03 ENCOUNTER — Ambulatory Visit: Payer: Medicare Other

## 2021-10-03 NOTE — Telephone Encounter (Signed)
Called patient to triage. Over the last couple of days she has been having stomach cramps, congestion, temp of 99.7, and feeling drained. She took a home covid test and it was a very faint positive, then took another that was negative. Patient stated she doesn't feel like going anywhere. Confirmed no weakness, sob, chest tightness, body aches, etc. Patient has been scheduled for a VV with Dr Olivia Mackie tomorrow and was advised to go to the urgent care/ED if symptoms worsen prior to appt.

## 2021-10-04 ENCOUNTER — Other Ambulatory Visit: Payer: Self-pay

## 2021-10-04 ENCOUNTER — Telehealth (INDEPENDENT_AMBULATORY_CARE_PROVIDER_SITE_OTHER): Payer: Medicare Other | Admitting: Internal Medicine

## 2021-10-04 ENCOUNTER — Encounter: Payer: Self-pay | Admitting: Internal Medicine

## 2021-10-04 VITALS — BP 122/72 | HR 102 | Ht 63.0 in | Wt 168.0 lb

## 2021-10-04 DIAGNOSIS — R5383 Other fatigue: Secondary | ICD-10-CM

## 2021-10-04 DIAGNOSIS — U071 COVID-19: Secondary | ICD-10-CM | POA: Diagnosis not present

## 2021-10-04 DIAGNOSIS — M791 Myalgia, unspecified site: Secondary | ICD-10-CM | POA: Diagnosis not present

## 2021-10-04 DIAGNOSIS — R490 Dysphonia: Secondary | ICD-10-CM | POA: Diagnosis not present

## 2021-10-04 DIAGNOSIS — L304 Erythema intertrigo: Secondary | ICD-10-CM

## 2021-10-04 LAB — POCT INFLUENZA A/B
Influenza A, POC: NEGATIVE
Influenza B, POC: NEGATIVE

## 2021-10-04 MED ORDER — NYSTATIN 100000 UNIT/GM EX CREA
1.0000 | TOPICAL_CREAM | Freq: Two times a day (BID) | CUTANEOUS | 11 refills | Status: DC
Start: 2021-10-04 — End: 2023-01-02

## 2021-10-04 MED ORDER — NYSTATIN 100000 UNIT/GM EX POWD
CUTANEOUS | 11 refills | Status: DC
Start: 2021-10-04 — End: 2021-12-14

## 2021-10-04 MED ORDER — MOLNUPIRAVIR EUA 200MG CAPSULE: 4.0000 | ORAL_CAPSULE | Freq: Two times a day (BID) | ORAL | 0 refills | Status: AC

## 2021-10-04 NOTE — Progress Notes (Signed)
Taking Sudafed over the counter

## 2021-10-04 NOTE — Patient Instructions (Signed)
These are over the counter medication options:  °Mucinex dm green label for cough/Robitussin DM  °Multivitamin  °Elderberry  °Oil of oregano drops °cepacol or chloroseptic spray for sore throat  °Warm salt water gargles with hydrogen peroxide  °Warm tea with honey and lemon  °Sugar free cough drops  °Hydration  °Try to eat though you dont feel like it   °Tylenol or Advil  °Nasal saline  °Flonase  °   °Monitor pulse oximeter, buy from [employer] if oxygen is less than 90 please go to the hospital.  °   °   °Are you feeling really sick? Shortness of breath, cough, chest pain?, dizziness? Confusion  ° If so let me know  °If worsening, go to hospital or [facility] Urgent care for further treatment. °   °Molnupiravir Oral Capsules °What is this medication? °MOLNUPIRAVIR (mol nue pir a vir) treats COVID-19. It is an antiviral medication. It may decrease the risk of developing severe symptoms of COVID-19. It may also decrease the chance of going to the hospital. This medication is not approved by the FDA. The FDA has authorized emergency use of this medication during the COVID-19 pandemic. °This medicine may be used for other purposes; ask your health care provider or pharmacist if you have questions. °COMMON BRAND NAME(S): LAGEVRIO °What should I tell my care team before I take this medication? °They need to know if you have any of these conditions: °Any allergies °Any serious illness °An unusual or allergic reaction to molnupiravir, other medications, foods, dyes, or preservatives °Pregnant or trying to get pregnant °Breast-feeding °How should I use this medication? °Take this medication by mouth with water. Take it as directed on the prescription label at the same time every day. Do not cut, crush or chew this medication. Swallow the capsules whole. You can take it with or without food. If it upsets your stomach, take it with food. Take all of this medication unless your care team tells you to stop it early. Keep  taking it even if you think you are better. °Talk to your care team about the use of this medication in children. Special care may be needed. °Overdosage: If you think you have taken too much of this medicine contact a poison control center or emergency room at once. °NOTE: This medicine is only for you. Do not share this medicine with others. °What if I miss a dose? °If you miss a dose, take it as soon as you can unless it is more than 10 hours late. If it is more than 10 hours late, skip the missed dose. Take the next dose at the normal time. Do not take extra or 2 doses at the same time to make up for the missed dose. °What may interact with this medication? °Interactions have not been studied. °This list may not describe all possible interactions. Give your health care provider a list of all the medicines, herbs, non-prescription drugs, or dietary supplements you use. Also tell them if you smoke, drink alcohol, or use illegal drugs. Some items may interact with your medicine. °What should I watch for while using this medication? °Your condition will be monitored carefully while you are receiving this medication. Visit your care team for regular checkups. Tell your care team if your symptoms do not start to get better or if they get worse. °Do not become pregnant while taking this medication. You may need a pregnancy test before starting this medication. Women must use a reliable form of   birth control while taking this medication and for 4 days after stopping the medication. Women should inform their care team if they wish to become pregnant or think they might be pregnant. Men should not father a child while taking this medication and for 3 months after stopping it. There is potential for serious harm to an unborn child. Talk to your care team for more information. Do not breast-feed an infant while taking this medication and for 4 days after stopping the medication. °What side effects may I notice from  receiving this medication? °Side effects that you should report to your care team as soon as possible: °Allergic reactions--skin rash, itching, hives, swelling of the face, lips, tongue, or throat °Side effects that usually do not require medical attention (report these to your care team if they continue or are bothersome): °Diarrhea °Dizziness °Nausea °This list may not describe all possible side effects. Call your doctor for medical advice about side effects. You may report side effects to FDA at 1-800-FDA-1088. °Where should I keep my medication? °Keep out of the reach of children and pets. °Store at room temperature between 20 and 25 degrees C (68 and 77 degrees F). Get rid of any unused medication after the expiration date. °To get rid of medications that are no longer needed or have expired: °Take the medication to a medication take-back program. Check with your pharmacy or law enforcement to find a location. °If you cannot return the medication, check the label or package insert to see if the medication should be thrown out in the garbage or flushed down the toilet. If you are not sure, ask your care team. If it is safe to put it in the trash, take the medication out of the container. Mix the medication with cat litter, dirt, coffee grounds, or other unwanted substance. Seal the mixture in a bag or container. Put it in the trash. °NOTE: This sheet is a summary. It may not cover all possible information. If you have questions about this medicine, talk to your doctor, pharmacist, or health care provider. °© 2022 [employer]vier/Gold Standard (2020-10-08 00:00:00) ° °

## 2021-10-04 NOTE — Addendum Note (Signed)
Addended by: Orland Mustard on: 10/04/2021 12:30 PM   Modules accepted: Orders

## 2021-10-04 NOTE — Progress Notes (Addendum)
Telephone Note  I connected with Carly [name]  on 10/04/21 at 11:15 AM EST by telephone  application and verified that I am speaking with the correct person using two identifiers.  Location patient: home, [place] Location provider:work or home office Persons participating in the virtual visit: patient, provider, pts caretaker [name]   I discussed the limitations of evaluation and management by telemedicine and the availability of in person appointments. The patient expressed understanding and agreed to proceed.   HPI:  Acute telemedicine visit for : Sick weds 10/02/21 with body aches, fatigue, congestion, hoarse voice, weakness subjective fever has Multiple sclerosis she took 1 home covid 19 test had faint pink and then took 3-4 more at home tests and negative 1 more test she tool on the same same.  Tried sudafed otc   She feels better this am still feels weak temp she reports 99.1, 98.7 and appetite normal   -COVID-19 vaccine status: 4/4  ROS: See pertinent positives and negatives per HPI.  Past Medical History:  Diagnosis Date   Allergy    Anxiety    Aspiration pneumonia (HCC)    Depression    Frequent headaches    H/O   GERD (gastroesophageal reflux disease)    History of chicken pox    History of colon polyps    Hx of migraines    Multiple sclerosis ([place]) 2011   OSA (obstructive sleep apnea)    PONV (postoperative nausea and vomiting)     Past Surgical History:  Procedure Laterality Date   BACK SURGERY     CHOLECYSTECTOMY     COLONOSCOPY WITH PROPOFOL N/A 05/18/2017   Procedure: COLONOSCOPY WITH PROPOFOL;  Surgeon: [name], MD;  Location: [facility] END[facility]COPY;  Service: Endoscopy;  Laterality: N/A;   FOOT SURGERY  2015   GALLBLADDER SURGERY  2008   HARDWARE REMOVAL Left 02/14/2016   Procedure: LEFT FOOT REMOVAL DEEP IMPLANT;  Surgeon: [name], MD;  Location: [facility];  Service: Orthopedics;  Laterality: Left;   HERNIA REPAIR     Inguinal  Hernia Repair   SPINE SURGERY  2014     Current Outpatient Medications:    acetaminophen (TYLENOL) 500 MG tablet, Take 1 tablet (500 mg total) by mouth every 4 (four) hours as needed., Disp: 30 tablet, Rfl: 0   amLODipine (NORVASC) 5 MG tablet, TAKE 1 TABLET BY MOUTH DAILY., Disp: 90 tablet, Rfl: 1   Baclofen 5 MG TABS, Take 1 tablet by mouth 3 (three) times daily., Disp: , Rfl:    Cholecalciferol (D3-1000 PO), Take 2,000 Units by mouth daily., Disp: , Rfl:    clopidogrel (PLAVIX) 75 MG tablet, TAKE 1 TABLET (75 MG) BY MOUTH EVERY DAY, Disp: 90 tablet, Rfl: 1   Coenzyme Q10 (CO Q10) 100 MG CAPS, Take 1 capsule by mouth daily., Disp: , Rfl:    dalfampridine 10 MG TB12, Take 10 mg by mouth every 12 (twelve) hours., Disp: , Rfl:    docusate sodium (COLACE) 100 MG capsule, Take 100 mg by mouth 2 (two) times daily., Disp: , Rfl:    DULoxetine (CYMBALTA) 60 MG capsule, TAKE 1 CAPSULE BY MOUTH DAILY., Disp: 90 capsule, Rfl: 1   fexofenadine (ALLEGRA ALLERGY) 180 MG tablet, Take 1 tablet (180 mg total) by mouth daily., Disp: 30 tablet, Rfl: 2   fluticasone (FLONASE) 50 MCG/ACT nasal spray, Place 1 spray into both nostrils daily., Disp: 16 g, Rfl: 2   furosemide (LASIX) 40 MG tablet, Take 1 tablet (  40 mg total) by mouth daily., Disp: 30 tablet, Rfl: 3   losartan (COZAAR) 50 MG tablet, TAKE 1 TABLET BY MOUTH ONCE DAILY, Disp: 90 tablet, Rfl: 1   magnesium oxide (MAG-OX) 400 MG tablet, TAKE 1 TABLET BY MOUTH DAILY, Disp: 30 tablet, Rfl: 5   METAMUCIL FIBER PO, Take by mouth 2 (two) times daily., Disp: , Rfl:    mirtazapine (REMERON SOL-TAB) 30 MG disintegrating tablet, Take 1 tablet (30 mg total) by mouth at bedtime., Disp: 30 tablet, Rfl: 3   molnupiravir EUA (LAGEVRIO) 200 mg CAPS capsule, Take 4 capsules (800 mg total) by mouth 2 (two) times daily for 5 days., Disp: 40 capsule, Rfl: 0   omeprazole (PRILOSEC) 20 MG capsule, Take 20 mg by mouth 2 (two) times daily before a meal., Disp: , Rfl:     rosuvastatin (CRESTOR) 5 MG tablet, Take 1 tablet (5 mg total) by mouth daily., Disp: 90 tablet, Rfl: 1   saccharomyces boulardii (FLORASTOR) 250 MG capsule, Take 1 capsule (250 mg total) by mouth 2 (two) times daily., Disp: 60 capsule, Rfl: 0   SEROQUEL 25 MG tablet, Take 12.5mg  in the morning and 50mg  at night, Disp: 60 tablet, Rfl: 3   sodium chloride (OCEAN) 0.65 % nasal spray, Place 1-2 sprays into the nose as needed., Disp: , Rfl:    albuterol (VENTOLIN HFA) 108 (90 Base) MCG/ACT inhaler, Inhale 2 puffs into the lungs every 6 (six) hours as needed. (Patient not taking: Reported on 10/04/2021), Disp: , Rfl:    amantadine (SYMMETREL) 100 MG capsule, Take 1 capsule (100 mg total) by mouth 2 (two) times daily. (Patient not taking: Reported on 10/04/2021), Disp: 60 capsule, Rfl: 5   busPIRone (BUSPAR) 15 MG tablet, Take 1 tablet (15 mg total) by mouth 2 (two) times daily. (Patient not taking: Reported on 10/04/2021), Disp: 60 tablet, Rfl: 5   ketoconazole (NIZORAL) 2 % cream, SMARTSIG:1 Application Topical Morning-Night (Patient not taking: Reported on 10/04/2021), Disp: , Rfl:    methylPREDNISolone sodium succinate (SOLU-MEDROL) 1000 MG injection, Inject 1,000 mg into the vein every 30 (thirty) days. (Patient not taking: Reported on 10/04/2021), Disp: , Rfl:    nystatin (MYCOSTATIN/NYSTOP) powder, APPLY TO AFFECTED AREAS TWICE A DAY IF NEEDED, Disp: 60 g, Rfl: 11   nystatin cream (MYCOSTATIN), Apply 1 application topically 2 (two) times daily., Disp: 30 g, Rfl: 11   Vitamin D, Ergocalciferol, (DRISDOL) 1.25 MG (50000 UNIT) CAPS capsule, Take 1 capsule (50,000 Units total) by mouth every 7 (seven) days. (Patient not taking: Reported on 10/04/2021), Disp: 7 capsule, Rfl: 0 No current facility-administered medications for this visit.  Facility-Administered Medications Ordered in Other Visits:    0.9 %  sodium chloride infusion, , Intravenous, Continuous, Corcoran, Melissa C, MD, Last Rate: 10 mL/hr at  04/25/16 0855, New Bag at 04/25/16 0855   acetaminophen (TYLENOL) tablet 650 mg, 650 mg, Oral, Once, Corcoran, Drue Second, MD  EXAM:  VITALS per patient if applicable:  GENERAL: alert, oriented, appears well and in no acute distress  PSYCH/NEURO: pleasant and cooperative, no obvious depression or anxiety, speech and thought processing grossly intact  ASSESSMENT AND PLAN:  Discussed the following assessment and plan:  Home COVID-19 test faint + on 10/02/21 pt started feeling bad Monday with sx's and weakness - Plan: POCT Influenza A/B negative , Novel Coronavirus, NAA pending Will Rx molnupiravir pt not sure if will take yet but wants to have in case she is declining but feeling somewhat better other than  weakness for now Advised if worsening call 911 and go to hospital These are over the counter medication options:  Mucinex dm green label for cough/Robitussin DM  Multivitamin  Elderberry  Oil of oregano drops cepacol or chloroseptic spray for sore throat  Warm salt water gargles with hydrogen peroxide  Warm tea with honey and lemon  Sugar free cough drops  Hydration  Try to eat though you dont feel like it   Tylenol or Advil  Nasal saline  Flonase     Monitor pulse oximeter, buy from [place] if oxygen is less than 90 please go to the hospital.        Are you feeling really sick? Shortness of breath, cough, chest pain?, dizziness? Confusion   If so let me know  If worsening, go to hospital or [facility] clinic Urgent care for further treatment.   Intertrigo - Plan: nystatin (MYCOSTATIN/NYSTOP) powder, nystatin cream (MYCOSTATIN) If flu + tx tamiflu  If covid 19 + consider covid 19 pill     -we discussed possible serious and likely etiologies, options for evaluation and workup, limitations of telemedicine visit vs in person visit, treatment, treatment risks and precautions. Pt is agreeable to treatment via telemedicine at this moment.    I discussed the assessment and  treatment plan with the patient. The patient was provided an opportunity to ask questions and all were answered. The patient agreed with the plan and demonstrated an understanding of the instructions.    Time spent 20 minutes  Delorise Jackson, MD

## 2021-10-05 LAB — NOVEL CORONAVIRUS, NAA: SARS-CoV-2, NAA: NOT DETECTED

## 2021-10-05 LAB — SARS-COV-2, NAA 2 DAY TAT

## 2021-10-08 ENCOUNTER — Ambulatory Visit: Payer: Medicare Other

## 2021-10-09 DIAGNOSIS — R490 Dysphonia: Secondary | ICD-10-CM | POA: Diagnosis not present

## 2021-10-09 DIAGNOSIS — H6123 Impacted cerumen, bilateral: Secondary | ICD-10-CM | POA: Diagnosis not present

## 2021-10-09 DIAGNOSIS — G4733 Obstructive sleep apnea (adult) (pediatric): Secondary | ICD-10-CM | POA: Diagnosis not present

## 2021-10-11 ENCOUNTER — Ambulatory Visit: Payer: Medicare Other

## 2021-10-13 ENCOUNTER — Encounter: Payer: Self-pay | Admitting: Hematology and Oncology

## 2021-10-16 ENCOUNTER — Ambulatory Visit: Payer: Medicare Other

## 2021-10-17 ENCOUNTER — Ambulatory Visit: Payer: Medicare PPO | Attending: Internal Medicine | Admitting: Physical Therapy

## 2021-10-17 ENCOUNTER — Other Ambulatory Visit: Payer: Self-pay

## 2021-10-17 DIAGNOSIS — R262 Difficulty in walking, not elsewhere classified: Secondary | ICD-10-CM | POA: Insufficient documentation

## 2021-10-17 DIAGNOSIS — M6281 Muscle weakness (generalized): Secondary | ICD-10-CM | POA: Diagnosis present

## 2021-10-17 DIAGNOSIS — R2681 Unsteadiness on feet: Secondary | ICD-10-CM | POA: Diagnosis present

## 2021-10-17 DIAGNOSIS — R269 Unspecified abnormalities of gait and mobility: Secondary | ICD-10-CM | POA: Diagnosis not present

## 2021-10-17 DIAGNOSIS — I890 Lymphedema, not elsewhere classified: Secondary | ICD-10-CM | POA: Insufficient documentation

## 2021-10-17 DIAGNOSIS — R2689 Other abnormalities of gait and mobility: Secondary | ICD-10-CM | POA: Diagnosis present

## 2021-10-17 DIAGNOSIS — R278 Other lack of coordination: Secondary | ICD-10-CM | POA: Diagnosis present

## 2021-10-17 NOTE — Therapy (Signed)
[place] MAIN [facility] SERVICES [address] [place], [place], [address] Phone: [phone]   Fax:  [phone]  Physical Therapy Treatment  Patient Details  Name: Carly [name] MRN: [id number] Date of Birth: 1950/11/20 Referring Provider (PT): Einar Pheasant MD   Encounter Date: 10/17/2021   PT End of Session - 10/17/21 1045     Visit Number 12    Number of Visits 24    Date for PT Re-Evaluation 10/31/21    Authorization Time Period 08/08/21-10/31/20    Progress Note Due on Visit 20    PT Start Time 1056    PT Stop Time 1140    PT Time Calculation (min) 44 min    Equipment Utilized During Treatment Gait belt    Activity Tolerance Patient tolerated treatment well    Behavior During Therapy [employer] for tasks assessed/performed             Past Medical History:  Diagnosis Date   Allergy    Anxiety    Aspiration pneumonia ([place])    Depression    Frequent headaches    H/O   GERD (gastroesophageal reflux disease)    History of chicken pox    History of colon polyps    Hx of migraines    Multiple sclerosis ([place]) 2011   OSA (obstructive sleep apnea)    PONV (postoperative nausea and vomiting)     Past Surgical History:  Procedure Laterality Date   BACK SURGERY     CHOLECYSTECTOMY     COLONOSCOPY WITH PROPOFOL N/A 05/18/2017   Procedure: COLONOSCOPY WITH PROPOFOL;  Surgeon: [name], MD;  Location: [facility] ENDOSCOPY;  Service: Endoscopy;  Laterality: N/A;   FOOT SURGERY  2015   GALLBLADDER SURGERY  2008   HARDWARE REMOVAL Left 02/14/2016   Procedure: LEFT FOOT REMOVAL DEEP IMPLANT;  Surgeon: [name], MD;  Location: [facility];  Service: Orthopedics;  Laterality: Left;   HERNIA REPAIR     Inguinal Hernia Repair   SPINE SURGERY  2014    There were no vitals filed for this visit.   Subjective Assessment - 10/17/21 1045     Subjective Pt reports she has been sick and began to feel a little better last  week sometime. Pt reports getting sick prior to being able to go and see her garndchildren in [place]. Pt reports no falls or stumbles since last session and reports no pain at this time.    Patient is accompained by: Family member    Pertinent History Pt reports she is in PT on order to allow for improvement with walking. Pt reports she has not been able to get [facility] PT because she is being seen by outpatient OT services. Pt reports she was walking  and her caregiver who helped her walk had a heart attack. Pt reports the last time she was walking regularly was about 2 years ago. She reports she gets up to [facility] with some assistance from her husband. Pt has walker, rollator and WC at home. Pt reports she used them often until her caregiver had the heart attack. Pt switched to new MS medication a couple years ago and attributes some of her dificits to the medicaiton change. Pt has significant difficulty with any activities involving standing. Pt lives in home wiht ramp to enter. House is one story with an upstairs but she lives primarilly on the first floor. Pt has shower which she can access. Pt reports she had  a stroke in february of 2022. She has weighted bracelets she has been using for arm strength. Pt reports she has not been to outpatient PT in the past but she has had [facility] in the past. Pt reports swimming in past for exercise. She also reports she is moving to [place] [facility] in January.    Limitations Standing;Walking    How long can you stand comfortably? 15 min.    How long can you walk comfortably? 10 min.    Patient Stated Goals Standing, walking, transfers    Currently in Pain? No/denies    Pain Onset Other (comment)   leg pain and swelling first noticed during long care trip ~ 7 hours            NTERVENTIONS:    seated:  Hip march 15x each LE -15x LLE focus, PT provides TC for improved AROM  Comments: Pt rates as hard  Alternating LAQ 12x each LE- Cues for eccentric  control 2 # ankle weight donned 12x LLE focus, PT provides intermittent hands-on assist to encourage increased knee ext.  Comments: Pt rates as medium  Seated toe raises x 15 BLE 2 # ankle weight donned  Seated heel raises x 15 BLE- Patient continues to exhibit minimal height on left LE -2 # ankle weight donned on the right LE  The following activities were completed in parallel bars or at balance bar  Unless otherwise stated, CGA was provided and gait belt donned in order to ensure pt safety Sides stepping x 1 length of balance bar, pt attempts to turn hips to ease difficulty of task, unable to complete without turning hips.   Standing lateral weight shift  x 10 with focus on hip shifts, B UE support    Standing mini squats 3 x 5 reps , heavy B UE support  Standing marching with B UE support 4 x 5 reps, healy B UE support  Standing heel taps 10x on R LE with min a and pt utilizing heavy BUE support on bars -unable to complete on the L LE, practiced in seated position.   Hedgehog seated heel taps 10x LLEs. Difficulty with L LE    Pt educated throughout session about proper posture and technique with exercises. Improved exercise technique, movement at target joints, use of target muscles after min to mod verbal, visual, tactile cues.  Pt required occasional rest breaks due fatigue, PT was quick to ask when pt appeared to be fatiguing in order to prevent excessive fatigue.                          PT Education - 10/17/21 1045     Education provided Yes    Education Details Exercise form and technique    Person(s) Educated Patient    Methods Explanation    Comprehension Verbalized understanding              PT Short Term Goals - 09/25/21 1106       PT SHORT TERM GOAL #1   Title Patient will be independent in home exercise program to improve strength/mobility for better functional independence with ADLs.    Baseline Pt does not have HEP; 09/25/2021-  Patient reports compliant wth HEP consisting of LE strengthening.    Time 4    Period Weeks    Status Achieved    Target Date 09/05/21      PT SHORT TERM GOAL #2   Title Patient  will increase FOTO score to equal to or greater than 4    to demonstrate statistically significant improvement in mobility and quality of life.    Baseline 28; 09/25/2021- will reassess next visit    Time 4    Period Weeks    Target Date 09/05/21               PT Long Term Goals - 09/25/21 1108       PT LONG TERM GOAL #1   Title Patient will be independent with advanced and progressive home program for strength and endurance in order to transition to self management    Baseline Pt does not have current formal HEP; 09/25/2021- Patient does report compliance with initial HEP consisting on seated therex for LE strengthening.    Time 4    Period Weeks    Status On-going    Target Date 10/03/21      PT LONG TERM GOAL #2   Title pt's FOTO score will improve by 4 points or more indicating improved confidence with daily tasks at home    Baseline 28 at intake: 09/25/2021- Will assess next visit    Time 6    Period Weeks    Status New      PT LONG TERM GOAL #3   Title Patient will deny any falls over past 6 weeks to demonstrate improved safety awareness at home and work.    Baseline Pt reports 3 falls in previous 6 months; 09/25/2021= No falls reported and patient has been focusing on walking  some in clinic.    Time 6    Period Weeks    Status Achieved      PT LONG TERM GOAL #4   Title Patient will perform sit to and from stand from her wheelchair with min assist and no upper extremity support in order to indicate improvement in function.    Baseline Patient requires mod assist from author and bilateral lower extremity support to transition to and from sitting. 09/25/2021- Patient continues to require repetitive VC, TC, and min physical assistance to stand as wel as B UE Support.    Time 12    Period  Weeks    Status On-going    Target Date 10/31/21      PT LONG TERM GOAL #5   Title Pt will increase 10MWT by at least 0.13 m/s in order to demonstrate clinically significant improvement in community ambulation.    Baseline 09/25/2021=10 MWT= 0.045 m/s using front wheeled walker    Time 5    Period Weeks    Status New    Target Date 10/31/21      Additional Long Term Goals   Additional Long Term Goals Yes      PT LONG TERM GOAL #6   Title Patient will ambulate on level surfaces using front wheeled walker with CGA > 200 feet without rest break or loss of balance for improved ability for household ambulation.    Baseline 09/25/2021= Patient was able to walk 32 feet today with front wheeled walker, Min assist with close w/c follow with decreased heel to toe and decreased step length.    Time 5    Period Weeks    Status New    Target Date 10/31/21                   Plan - 10/17/21 1046     Clinical Impression Statement Patient motivated and participated fair within session. Pt able  to perform several activities both in standing position and with transitioning to standing position. Pt continues to be quick to fatigue and hasdifficulty with movement of the left lower extermity, particularly with stepping. Pt will continue to benefit from skilled PT intervention in order to improve her LE strength, ambulatory capacity, and QOL.    Personal Factors and Comorbidities Age;Comorbidity 1;Comorbidity 2;Comorbidity 3+;Time since onset of injury/illness/exacerbation    Comorbidities Depression, anxiety, HTN, MS, DDD, GERD    Examination-Activity Limitations Bed Mobility;Carry;Continence;Dressing;Hygiene/Grooming;Lift;Locomotion Level;Squat;Stairs;Stand;Toileting;Transfers    Examination-Participation Restrictions Cleaning;Community Activity;Laundry;Meal Prep;Shop    Stability/Clinical Decision Making Evolving/Moderate complexity    Rehab Potential Fair    Clinical Impairments Affecting  Rehab Potential (+) family support, motivated (-) MS, decreased confidence, co morbidities    PT Frequency 2x / week    PT Duration 12 weeks    PT Treatment/Interventions Patient/family education;Gait training;Neuromuscular re-education;Therapeutic exercise;Manual techniques;ADLs/Self Care Home Management;Stair training;DME Instruction;Functional mobility training;Therapeutic activities;Balance training;Wheelchair mobility training;Energy conservation;Joint Manipulations    PT Next Visit Plan gait in // bars, strength, endurance; continue POC as previously indicated    PT Home Exercise Plan Access Code: [email or web address]; no updates    Consulted and Agree with Plan of Care Patient             Patient will benefit from skilled therapeutic intervention in order to improve the following deficits and impairments:  Decreased strength, Decreased balance, Decreased activity tolerance, Decreased endurance, Difficulty walking, Abnormal gait, Improper body mechanics, Decreased mobility, Hypomobility  Visit Diagnosis: Abnormality of gait and mobility  Difficulty in walking, not elsewhere classified  Muscle weakness (generalized)  Unsteadiness on feet     Problem List Patient Active Problem List   Diagnosis Date Noted   Pre-op evaluation 08/31/2021   Rash 01/27/2021   History of CVA (cerebrovascular accident) 01/05/2021   PBA (pseudobulbar affect)    MS (multiple sclerosis) (HCC)    Anxiety and depression    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   Generalized anxiety disorder 12/11/2020   Acute focal neurological deficit 12/08/2020   Edema 09/28/2020   Bloating 08/25/2020   Environmental allergies 07/17/2019   Hyperglycemia 07/17/2019   Cough 06/06/2019   Memory change 05/29/2019   Abnormal liver function tests 01/09/2019   Hemorrhoid 09/27/2018   Nausea 08/11/2018   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   Depression with anxiety 02/04/2018   Cognitive and behavioral  changes 02/04/2018   Constipation 11/17/2017   Hx of adenomatous colonic polyps 06/06/2016   Lower extremity edema 01/13/2016   Bilateral ovarian cysts 10/16/2015   Health care maintenance 10/16/2015   Colon polyp 01/25/2015   Bone/cartilage disorder 01/25/2015   Headache 11/18/2014   Depression, recurrent ([place]) 11/18/2014   Greater tuberosity of humerus fracture 11/18/2014   Multiple sclerosis ([place]) 11/18/2014   Unsteady gait 11/18/2014   Dizziness 11/18/2014   Inconclusive mammogram 03/15/2013   S/P lumbar spinal fusion 01/04/2013   Surgery, other elective 12/03/2012   DDD (degenerative disc disease), lumbosacral 10/15/2012   Back ache 09/06/2012   Hypertension 09/06/2012   Back pain 09/06/2012   Hypercholesterolemia 01/28/2012   Climacteric 01/28/2012   Routine general medical examination at a health care facility 01/28/2012   Avitaminosis D 01/28/2012    Particia Lather, PT 10/17/2021, 4:33 PM  [place] MAIN [facility] SERVICES [address] [place], [place], [address] Phone: [phone]   Fax:  [phone]  Name: Carly [name] MRN: [id number] Date of Birth: Mar 31, 1951

## 2021-10-18 ENCOUNTER — Ambulatory Visit: Payer: Medicare Other | Admitting: Physical Therapy

## 2021-10-18 ENCOUNTER — Telehealth (INDEPENDENT_AMBULATORY_CARE_PROVIDER_SITE_OTHER): Payer: Medicare Other | Admitting: Internal Medicine

## 2021-10-18 ENCOUNTER — Encounter: Payer: Self-pay | Admitting: Internal Medicine

## 2021-10-18 DIAGNOSIS — G35 Multiple sclerosis: Secondary | ICD-10-CM

## 2021-10-18 DIAGNOSIS — R059 Cough, unspecified: Secondary | ICD-10-CM

## 2021-10-18 DIAGNOSIS — F411 Generalized anxiety disorder: Secondary | ICD-10-CM

## 2021-10-18 DIAGNOSIS — F339 Major depressive disorder, recurrent, unspecified: Secondary | ICD-10-CM

## 2021-10-18 DIAGNOSIS — Z8673 Personal history of transient ischemic attack (TIA), and cerebral infarction without residual deficits: Secondary | ICD-10-CM

## 2021-10-18 DIAGNOSIS — R739 Hyperglycemia, unspecified: Secondary | ICD-10-CM

## 2021-10-18 DIAGNOSIS — E7800 Pure hypercholesterolemia, unspecified: Secondary | ICD-10-CM

## 2021-10-18 DIAGNOSIS — I10 Essential (primary) hypertension: Secondary | ICD-10-CM

## 2021-10-18 MED ORDER — BENZONATATE 100 MG PO CAPS: 100.0000 mg | ORAL_CAPSULE | Freq: Three times a day (TID) | ORAL | 0 refills | Status: DC | PRN

## 2021-10-18 MED ORDER — ALBUTEROL SULFATE HFA 108 (90 BASE) MCG/ACT IN AERS: 2.0000 | INHALATION_SPRAY | Freq: Four times a day (QID) | RESPIRATORY_TRACT | 0 refills | Status: DC | PRN

## 2021-10-18 NOTE — Progress Notes (Signed)
Patient ID: Carly [name], female   DOB: 06-25-1951, 71 y.o.   MRN: [id number]   Virtual Visit via video Note  This visit type was conducted due to national recommendations for restrictions regarding the COVID-19 pandemic (e.g. social distancing).  This format is felt to be most appropriate for this patient at this time.  All issues noted in this document were discussed and addressed.  No physical exam was performed (except for noted visual exam findings with Video Visits).   I connected with Carly [name] by a video enabled telemedicine application and verified that I am speaking with the correct person using two identifiers. Location patient: home Location provider: work  Persons participating in the virtual visit: patient, provider  The limitations, risks, security and privacy concerns of performing an evaluation and management service by video and the availability of in person appointments have been discussed.  It has also been discussed with the patient that there may be a patient responsible charge related to this service. The patient expressed understanding and agreed to proceed.   Reason for visit:  follow up appt   HPI: She was evaluated by Dr Carly [name] 10/04/21 - body aches, fatigue, congestion, nausea and subjective fever - treated for covid.  Given rx for molnupiravir.  Also sees Dr Carly [name] (recently evaluated) - nasal congestion.  Taking coricidin.  She is feeling better.  Still with some cough.  No chest pain or sob reported.  No vomiting.  Bowels - ok.  Discussed prilosec for acid reflux.  Request referral to [facility] for hand therapy (PT).     ROS: See pertinent positives and negatives per HPI.  Past Medical History:  Diagnosis Date   Allergy    Anxiety    Aspiration pneumonia (HCC)    Depression    Frequent headaches    H/O   GERD (gastroesophageal reflux disease)    History of chicken pox    History of colon polyps    Hx of migraines    Multiple sclerosis  ([place]) 2011   OSA (obstructive sleep apnea)    PONV (postoperative nausea and vomiting)     Past Surgical History:  Procedure Laterality Date   BACK SURGERY     CHOLECYSTECTOMY     COLONOSCOPY WITH PROPOFOL N/A 05/18/2017   Procedure: COLONOSCOPY WITH PROPOFOL;  Surgeon: [name], MD;  Location: [facility] ENDOSCOPY;  Service: Endoscopy;  Laterality: N/A;   FOOT SURGERY  2015   GALLBLADDER SURGERY  2008   HARDWARE REMOVAL Left 02/14/2016   Procedure: LEFT FOOT REMOVAL DEEP IMPLANT;  Surgeon: [name], MD;  Location: [facility];  Service: Orthopedics;  Laterality: Left;   HERNIA REPAIR     Inguinal Hernia Repair   SPINE SURGERY  2014    Family History  Problem Relation Age of Onset   Arthritis Mother    Hypertension Mother    Macular degeneration Mother    Hypertension Father    Hyperlipidemia Father    Heart disease Maternal Grandfather    Diabetes Maternal Grandfather    Kidney disease Paternal Grandmother     SOCIAL HX: reviewed.    Current Outpatient Medications:    benzonatate (TESSALON PERLES) 100 MG capsule, Take 1 capsule (100 mg total) by mouth 3 (three) times daily as needed for cough., Disp: 21 capsule, Rfl: 0   acetaminophen (TYLENOL) 500 MG tablet, Take 1 tablet (500 mg total) by mouth every 4 (four) hours as needed., Disp: 30 tablet, Rfl: 0  albuterol (VENTOLIN HFA) 108 (90 Base) MCG/ACT inhaler, Inhale 2 puffs into the lungs every 6 (six) hours as needed., Disp: 18 g, Rfl: 0   amLODipine (NORVASC) 5 MG tablet, TAKE 1 TABLET BY MOUTH DAILY., Disp: 90 tablet, Rfl: 1   Baclofen 5 MG TABS, Take 1 tablet by mouth 3 (three) times daily., Disp: , Rfl:    Cholecalciferol (D3-1000 PO), Take 2,000 Units by mouth daily., Disp: , Rfl:    clopidogrel (PLAVIX) 75 MG tablet, TAKE 1 TABLET (75 MG) BY MOUTH EVERY DAY, Disp: 90 tablet, Rfl: 1   Coenzyme Q10 (CO Q10) 100 MG CAPS, Take 1 capsule by mouth daily., Disp: , Rfl:    dalfampridine 10 MG TB12, Take 10  mg by mouth every 12 (twelve) hours., Disp: , Rfl:    docusate sodium (COLACE) 100 MG capsule, Take 100 mg by mouth 2 (two) times daily., Disp: , Rfl:    DULoxetine (CYMBALTA) 60 MG capsule, TAKE 1 CAPSULE BY MOUTH DAILY., Disp: 90 capsule, Rfl: 1   fexofenadine (ALLEGRA ALLERGY) 180 MG tablet, Take 1 tablet (180 mg total) by mouth daily., Disp: 30 tablet, Rfl: 2   fluticasone (FLONASE) 50 MCG/ACT nasal spray, Place 1 spray into both nostrils daily., Disp: 16 g, Rfl: 2   furosemide (LASIX) 40 MG tablet, Take 1 tablet (40 mg total) by mouth daily., Disp: 30 tablet, Rfl: 3   losartan (COZAAR) 50 MG tablet, TAKE 1 TABLET BY MOUTH ONCE DAILY, Disp: 90 tablet, Rfl: 1   magnesium oxide (MAG-OX) 400 MG tablet, TAKE 1 TABLET BY MOUTH DAILY, Disp: 30 tablet, Rfl: 5   METAMUCIL FIBER PO, Take by mouth 2 (two) times daily., Disp: , Rfl:    mirtazapine (REMERON SOL-TAB) 30 MG disintegrating tablet, Take 1 tablet (30 mg total) by mouth at bedtime., Disp: 30 tablet, Rfl: 3   nystatin (MYCOSTATIN/NYSTOP) powder, APPLY TO AFFECTED AREAS TWICE A DAY IF NEEDED, Disp: 60 g, Rfl: 11   nystatin cream (MYCOSTATIN), Apply 1 application topically 2 (two) times daily., Disp: 30 g, Rfl: 11   omeprazole (PRILOSEC) 20 MG capsule, Take 20 mg by mouth 2 (two) times daily before a meal., Disp: , Rfl:    rosuvastatin (CRESTOR) 5 MG tablet, Take 1 tablet (5 mg total) by mouth daily., Disp: 90 tablet, Rfl: 1   saccharomyces boulardii (FLORASTOR) 250 MG capsule, Take 1 capsule (250 mg total) by mouth 2 (two) times daily., Disp: 60 capsule, Rfl: 0   SEROQUEL 25 MG tablet, Take 12.5mg  in the morning and 50mg  at night, Disp: 60 tablet, Rfl: 3   sodium chloride (OCEAN) 0.65 % nasal spray, Place 1-2 sprays into the nose as needed., Disp: , Rfl:  No current facility-administered medications for this visit.  Facility-Administered Medications Ordered in Other Visits:    0.9 %  sodium chloride infusion, , Intravenous, Continuous, Corcoran,  Melissa C, MD, Last Rate: 10 mL/hr at 04/25/16 0855, New Bag at 04/25/16 0855   acetaminophen (TYLENOL) tablet 650 mg, 650 mg, Oral, Once, Corcoran, Melissa C, MD  EXAM:  GENERAL: alert, oriented, appears well and in no acute distress  HEENT: atraumatic, conjunttiva clear, no obvious abnormalities on inspection of external nose and ears  NECK: normal movements of the head and neck  LUNGS: on inspection no signs of respiratory distress, breathing rate appears normal, no obvious gross SOB, gasping or wheezing  CV: no obvious cyanosis  PSYCH/NEURO: pleasant and cooperative, no obvious depression or anxiety, speech and thought processing grossly intact  ASSESSMENT AND PLAN:  Discussed the following assessment and plan:  Problem List Items Addressed This Visit     Cough    Persistent cough as outlined.  Feeling better.  No chest pain or sob.  Tessalon perles as directed.  Continue prilosec.  Follow.  Call with update.  If persistent cough, will require cxr.        Depression, recurrent ([place])    Followed by psychiatry.  On seroquel, remeron.  Follow.        Generalized anxiety disorder    Followed by psychiatry.        History of CVA (cerebrovascular accident)    Continue risk factor modification.  Appears to be tolerating statin.  Continue plavix.  With persistent issues with her hand. Request therapy.  Request referral back to [facility].         Relevant Orders   Ambulatory referral to Physical Therapy   Hypercholesterolemia    Continue crestor.  Low cholesterol diet and exercise.  Follow lipid panel and liver function tests.       Hyperglycemia    Low-carb diet and exercise.  Follow metabolic panel and O6V.      Hypertension    Continue losartan and amlodipine.  Follow pressures.  Follow metabolic panel.       Multiple sclerosis ([place])    Followed by neurology.  Continue exercises/therapy. Seeing Dr Felecia Shelling.        Return in about 8 weeks (around 12/13/2021) for  follow up appt (30 min).   I discussed the assessment and treatment plan with the patient. The patient was provided an opportunity to ask questions and all were answered. The patient agreed with the plan and demonstrated an understanding of the instructions.   The patient was advised to call back or seek an in-person evaluation if the symptoms worsen or if the condition fails to improve as anticipated.    Einar Pheasant, MD

## 2021-10-20 ENCOUNTER — Encounter: Payer: Self-pay | Admitting: Internal Medicine

## 2021-10-20 NOTE — Assessment & Plan Note (Signed)
Low-carb diet and exercise.  Follow metabolic panel and A1c. 

## 2021-10-20 NOTE — Assessment & Plan Note (Signed)
Followed by psychiatry.  On seroquel, remeron.  Follow.

## 2021-10-20 NOTE — Assessment & Plan Note (Signed)
Persistent cough as outlined.  Feeling better.  No chest pain or sob.  Tessalon perles as directed.  Continue prilosec.  Follow.  Call with update.  If persistent cough, will require cxr.

## 2021-10-20 NOTE — Assessment & Plan Note (Signed)
Followed by neurology.  Continue exercises/therapy. Seeing Dr Felecia Shelling.

## 2021-10-20 NOTE — Assessment & Plan Note (Signed)
Followed by psychiatry 

## 2021-10-20 NOTE — Assessment & Plan Note (Signed)
Continue risk factor modification.  Appears to be tolerating statin.  Continue plavix.  With persistent issues with her hand. Request therapy.  Request referral back to [facility].

## 2021-10-20 NOTE — Assessment & Plan Note (Signed)
Continue losartan and amlodipine.  Follow pressures.  Follow metabolic panel.  

## 2021-10-20 NOTE — Assessment & Plan Note (Signed)
Continue crestor.  Low cholesterol diet and exercise. Follow lipid panel and liver function tests.   

## 2021-10-21 ENCOUNTER — Other Ambulatory Visit: Payer: Self-pay

## 2021-10-21 ENCOUNTER — Ambulatory Visit: Payer: Medicare PPO

## 2021-10-21 DIAGNOSIS — R2689 Other abnormalities of gait and mobility: Secondary | ICD-10-CM

## 2021-10-21 DIAGNOSIS — R269 Unspecified abnormalities of gait and mobility: Secondary | ICD-10-CM | POA: Diagnosis not present

## 2021-10-21 DIAGNOSIS — M6281 Muscle weakness (generalized): Secondary | ICD-10-CM

## 2021-10-21 DIAGNOSIS — R2681 Unsteadiness on feet: Secondary | ICD-10-CM

## 2021-10-21 NOTE — Therapy (Signed)
[place] MAIN [facility] SERVICES [address] [place], [place], [address] Phone: [phone]   Fax:  [phone]  Physical Therapy Treatment  Patient Details  Name: Carly [name] MRN: [id number] Date of Birth: 08-30-1951 Referring Provider (PT): Einar Pheasant MD   Encounter Date: 10/21/2021   PT End of Session - 10/21/21 1707     Visit Number 13    Number of Visits 24    Date for PT Re-Evaluation 10/31/21    Authorization Time Period 08/08/21-10/31/20    Progress Note Due on Visit 20    PT Start Time 1517    PT Stop Time 1600    PT Time Calculation (min) 43 min    Equipment Utilized During Treatment Gait belt    Activity Tolerance Patient tolerated treatment well    Behavior During Therapy [facility] for tasks assessed/performed             Past Medical History:  Diagnosis Date   Allergy    Anxiety    Aspiration pneumonia ([place])    Depression    Frequent headaches    H/O   GERD (gastroesophageal reflux disease)    History of chicken pox    History of colon polyps    Hx of migraines    Multiple sclerosis ([place]) 2011   OSA (obstructive sleep apnea)    PONV (postoperative nausea and vomiting)     Past Surgical History:  Procedure Laterality Date   BACK SURGERY     CHOLECYSTECTOMY     COLONOSCOPY WITH PROPOFOL N/A 05/18/2017   Procedure: COLONOSCOPY WITH PROPOFOL;  Surgeon: [name], MD;  Location: [facility] ENDOSCOPY;  Service: Endoscopy;  Laterality: N/A;   FOOT SURGERY  2015   GALLBLADDER SURGERY  2008   HARDWARE REMOVAL Left 02/14/2016   Procedure: LEFT FOOT REMOVAL DEEP IMPLANT;  Surgeon: [name], MD;  Location: [facility];  Service: Orthopedics;  Laterality: Left;   HERNIA REPAIR     Inguinal Hernia Repair   SPINE SURGERY  2014    There were no vitals filed for this visit.   INTERVENTIONS: Gait belt donned and CGA provided throughout unless otherwise specified.   In // bars:   STS - 1x5, 1x8.  Pt primarily pulls to stand. Pt did attempt with pushing off of arm rests for 2 reps, but it was very difficult.  Chair dips 1x5; pt with decreasing ROM with reps, very challenging for pt. RTB tricep extension - 2x15 B. Pt rates hard.   Ambulation for LE strength/endurance forward/backward in // bars with WC follow and up to min assist. Pt ambulates 3x forward/backward. Difficulty with foot clearance/exhibits foot drag. PT cues pt for improved quad activation throughout.    seated:  Hip march 2x15 each LE PT provides TC for improved AROM, focus on LLE Comments: Pt rates as medium  Alternating LAQ with 2# weights on each LE - 2x15 each LE Comments: Pt rates as easy-medium   Seated toe raises with 2 # ankle weight donned 2x20 alternating; pt rates as medium  Seated heel raises 2x12 BLE with 2# weights donned- Patient continues to exhibit minimal ROM on left LE. This does improve when patient rests 3-5 seconds between each rep.   Access Code: [facility] The URL: [email or web address] Date: 10/21/2021 Prepared by: Ricard Dillon  Exercises Seated Elbow Extension with Self-Anchored Resistance - 1 x daily - 5 x weekly - 2 sets - 15 reps      Pt educated  throughout session about proper posture and technique with exercises. Improved exercise technique, movement at target joints, use of target muscles after min to mod verbal, visual, tactile cues.       PT Education - 10/21/21 1706     Education provided Yes    Education Details exercise technique, body mechanics, addition to HEP    Person(s) Educated Patient    Methods Explanation;Demonstration;Tactile cues;Verbal cues;Handout    Comprehension Verbalized understanding;Verbal cues required;Tactile cues required;Need further instruction;Returned demonstration              PT Short Term Goals - 09/25/21 1106       PT SHORT TERM GOAL #1   Title Patient will be independent in home exercise program to improve  strength/mobility for better functional independence with ADLs.    Baseline Pt does not have HEP; 09/25/2021- Patient reports compliant wth HEP consisting of LE strengthening.    Time 4    Period Weeks    Status Achieved    Target Date 09/05/21      PT SHORT TERM GOAL #2   Title Patient will increase FOTO score to equal to or greater than 4    to demonstrate statistically significant improvement in mobility and quality of life.    Baseline 28; 09/25/2021- will reassess next visit    Time 4    Period Weeks    Target Date 09/05/21               PT Long Term Goals - 09/25/21 1108       PT LONG TERM GOAL #1   Title Patient will be independent with advanced and progressive home program for strength and endurance in order to transition to self management    Baseline Pt does not have current formal HEP; 09/25/2021- Patient does report compliance with initial HEP consisting on seated therex for LE strengthening.    Time 4    Period Weeks    Status On-going    Target Date 10/03/21      PT LONG TERM GOAL #2   Title pt's FOTO score will improve by 4 points or more indicating improved confidence with daily tasks at home    Baseline 28 at intake: 09/25/2021- Will assess next visit    Time 6    Period Weeks    Status New      PT LONG TERM GOAL #3   Title Patient will deny any falls over past 6 weeks to demonstrate improved safety awareness at home and work.    Baseline Pt reports 3 falls in previous 6 months; 09/25/2021= No falls reported and patient has been focusing on walking  some in clinic.    Time 6    Period Weeks    Status Achieved      PT LONG TERM GOAL #4   Title Patient will perform sit to and from stand from her wheelchair with min assist and no upper extremity support in order to indicate improvement in function.    Baseline Patient requires mod assist from author and bilateral lower extremity support to transition to and from sitting. 09/25/2021- Patient continues to  require repetitive VC, TC, and min physical assistance to stand as wel as B UE Support.    Time 12    Period Weeks    Status On-going    Target Date 10/31/21      PT LONG TERM GOAL #5   Title Pt will increase 10MWT by at least 0.13 m/s in  order to demonstrate clinically significant improvement in community ambulation.    Baseline 09/25/2021=10 MWT= 0.045 m/s using front wheeled walker    Time 5    Period Weeks    Status New    Target Date 10/31/21      Additional Long Term Goals   Additional Long Term Goals Yes      PT LONG TERM GOAL #6   Title Patient will ambulate on level surfaces using front wheeled walker with CGA > 200 feet without rest break or loss of balance for improved ability for household ambulation.    Baseline 09/25/2021= Patient was able to walk 32 feet today with front wheeled walker, Min assist with close w/c follow with decreased heel to toe and decreased step length.    Time 5    Period Weeks    Status New    Target Date 10/31/21                   Plan - 10/21/21 1707     Clinical Impression Statement Pt highly motivated to participate in session. She responds to interventions well without reports of pain. Pt was able to advance number of repitions performed with therex. Pt still very challenged by performing STS with pushing off of arm rests. PT reviewed chair dips and theraband tricep extension. PT provided pt with HEP handout of theraband tricep extension, where the pt rated the intervention as difficult. The pt will benefit from further skilled PT to improve LE strength and ambulatory capacity in order to increase QOL.    Personal Factors and Comorbidities Age;Comorbidity 1;Comorbidity 2;Comorbidity 3+;Time since onset of injury/illness/exacerbation    Comorbidities Depression, anxiety, HTN, MS, DDD, GERD    Examination-Activity Limitations Bed Mobility;Carry;Continence;Dressing;Hygiene/Grooming;Lift;Locomotion  Level;Squat;Stairs;Stand;Toileting;Transfers    Examination-Participation Restrictions Cleaning;Community Activity;Laundry;Meal Prep;Shop    Stability/Clinical Decision Making Evolving/Moderate complexity    Rehab Potential Fair    Clinical Impairments Affecting Rehab Potential (+) family support, motivated (-) MS, decreased confidence, co morbidities    PT Frequency 2x / week    PT Duration 12 weeks    PT Treatment/Interventions Patient/family education;Gait training;Neuromuscular re-education;Therapeutic exercise;Manual techniques;ADLs/Self Care Home Management;Stair training;DME Instruction;Functional mobility training;Therapeutic activities;Balance training;Wheelchair mobility training;Energy conservation;Joint Manipulations    PT Next Visit Plan gait in // bars, strength, endurance; continue POC as previously indicated    PT Home Exercise Plan Access Code: [email or web address]; no updates; 1/9: Access Code: [email or web address]    Consulted and Agree with Plan of Care Patient             Patient will benefit from skilled therapeutic intervention in order to improve the following deficits and impairments:  Decreased strength, Decreased balance, Decreased activity tolerance, Decreased endurance, Difficulty walking, Abnormal gait, Improper body mechanics, Decreased mobility, Hypomobility  Visit Diagnosis: Muscle weakness (generalized)  Other abnormalities of gait and mobility  Unsteadiness on feet     Problem List Patient Active Problem List   Diagnosis Date Noted   Pre-op evaluation 08/31/2021   Rash 01/27/2021   History of CVA (cerebrovascular accident) 01/05/2021   PBA (pseudobulbar affect)    MS (multiple sclerosis) (HCC)    Anxiety and depression    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   Generalized anxiety disorder 12/11/2020   Acute focal neurological deficit 12/08/2020   Edema 09/28/2020   Bloating 08/25/2020   Environmental allergies 07/17/2019   Hyperglycemia  07/17/2019   Cough 06/06/2019   Memory change 05/29/2019   Abnormal liver function tests 01/09/2019   Hemorrhoid 09/27/2018  Nausea 08/11/2018   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   Depression with anxiety 02/04/2018   Cognitive and behavioral changes 02/04/2018   Constipation 11/17/2017   Hx of adenomatous colonic polyps 06/06/2016   Lower extremity edema 01/13/2016   Bilateral ovarian cysts 10/16/2015   Health care maintenance 10/16/2015   Colon polyp 01/25/2015   Bone/cartilage disorder 01/25/2015   Headache 11/18/2014   Depression, recurrent ([place]) 11/18/2014   Greater tuberosity of humerus fracture 11/18/2014   Multiple sclerosis ([place]) 11/18/2014   Unsteady gait 11/18/2014   Dizziness 11/18/2014   Inconclusive mammogram 03/15/2013   S/P lumbar spinal fusion 01/04/2013   Surgery, other elective 12/03/2012   DDD (degenerative disc disease), lumbosacral 10/15/2012   Back ache 09/06/2012   Hypertension 09/06/2012   Back pain 09/06/2012   Hypercholesterolemia 01/28/2012   Climacteric 01/28/2012   Routine general medical examination at a health care facility 01/28/2012   Avitaminosis D 01/28/2012    Zollie Pee, PT 10/21/2021, 5:10 PM  [place] MAIN [facility] SERVICES [address] [place], [place], [address] Phone: [phone]   Fax:  [phone]  Name: Carly [name] MRN: [id number] Date of Birth: 1951-05-22

## 2021-10-22 ENCOUNTER — Ambulatory Visit: Payer: Medicare PPO

## 2021-10-22 ENCOUNTER — Ambulatory Visit: Payer: Medicare Other

## 2021-10-22 ENCOUNTER — Encounter: Payer: Medicare PPO | Attending: Physical Medicine and Rehabilitation | Admitting: Physical Medicine and Rehabilitation

## 2021-10-22 DIAGNOSIS — F411 Generalized anxiety disorder: Secondary | ICD-10-CM

## 2021-10-22 DIAGNOSIS — I890 Lymphedema, not elsewhere classified: Secondary | ICD-10-CM | POA: Insufficient documentation

## 2021-10-22 DIAGNOSIS — G4733 Obstructive sleep apnea (adult) (pediatric): Secondary | ICD-10-CM | POA: Diagnosis not present

## 2021-10-22 MED ORDER — SEROQUEL 25 MG PO TABS: 25.0000 mg | ORAL_TABLET | Freq: Four times a day (QID) | ORAL | 3 refills | Status: DC | PRN

## 2021-10-22 NOTE — Telephone Encounter (Signed)
Pt called in regards to VV she had 12/23 to covid. Pt called and stated she is feeling a lot better however she would like to speak with Larena Glassman also. Please give pt a call back.

## 2021-10-22 NOTE — Telephone Encounter (Signed)
Patient was calling to update from [place] visit letting me know she is feeling better. Cough subsided. Patient was wondering when or if we could proceed with colonoscopy and cataract surgery

## 2021-10-22 NOTE — Telephone Encounter (Signed)
She sees neurology (Dr Felecia Shelling) for her MS.  I would like to get his input regarding stopping her blood thinner (given her history of CVA) prior to a surgery or procedure.  Can schedule f/u appt with him to discuss.

## 2021-10-22 NOTE — Progress Notes (Signed)
Subjective:    Patient ID: Carly [name], female    DOB: [birth year], 71 y.o.   MRN: [id number]  HPI  An audio/video tele-health visit is felt to be the most appropriate encounter for this patient at this time. This is a follow up tele-visit via phone. The patient is at home. MD is at office. Prior to scheduling this appointment, our staff discussed the limitations of evaluation and management by telemedicine and the availability of in-person appointments. The patient expressed understanding and agreed to proceed.     Carly [name] is a 71 year old woman who was admitted to CIR with white matter periventricular CVA, presents for follow-up regarding anxiety, insomnia, OSA, and weight gain.    1) Anxiety:  -her psychiatrist previously transitioned her from Seroquel back to benzodiazepine due to side effect profile of Seroquel, but this worsened her anxiety so we restarted it -she has been having a lot of anxiety related to her and her husband's move from their current home into a retirement home and she asks whether she may take the seroquel additionally as needed- once in the morning, once later in the day, and two at night -she is interested in following with a behavioral therapist -she and husband Carly [name] were scared of side effects of Seroquel that were discussed- she has not noted any this far.  -she has been sleeping but her legs feel very stiff at night -she continues to take Cymbalta 60mg  -we tried weaning her off the mirtazepine but she experienced weakness and insomnia and wanted to restart- titrated back up to her former dose.  -she discussed with her husband, who is also present on this call today, and she would prefer to restart Seroquel and stop Lorazepam.  -she has not had any counseling and would like to restart this. She has an appointment with [name] in August and would like to start counseling earlier than this if possible.  -she loved speaking with [name]  inpatient and would like to follow with him -she had symptoms of dizziness yesterday at the hairdresser and was asked to contact me regarding if this could be from the seroquel. She had had no other similar episodes since starting it.  -she has been sleeping well with the Seroquel at night.  -she would like a list of foods that can help anxiety.  -she asks about the dietary advice I gave her last visit regarding anxiety. -she has been using essential oil.  -the prednisone has caused weight gain.   2) MS: -has been having a lot of stiffness recently -EMS called twice due to difficulty getting her up off the floor -is receiving home therapy -she would like to see a different neurologist and asked for my recommendation   3) Lymphedema  -has significant bilateral lower extremity  -has greatly improved with lymphedema therapy -lasix 20mg  did not help enough.   4) HTN: SBP has been well controlled, but still with high sytolics of [id number] at times  5) Decreased appetite:  -weaning off of [name] has caused decreased appetite and decreased energy.   6) She developed pain in right side, and not sure if this is due to topamax or the antibiotics she is on to treat a respiratory infection.   7) Insomnia:  -sleeping much better since she started back on [name]  8) Chest congestion -she has been having symptoms of congestion for greater than 3 months -Her father saw [name] for pulmonary fibrosis that it was  suspected he developed while working in tobacco fields. She received a call to schedule an appointment with [name] office and is thankful for the referral.  -she asks what else she can do to help with this -she has been through multiple rounds of antibiotics without benefit.  -also feeling diffuse joint aches.   9) GERD -has been experiencing after eating  10) Sinus congestion -pleased that her CT results were normal.   11) OSA -she notes that she was found  to have OSA on recent sleep study and she was tearful about this as she feels it is just another condition she has -she was surprised as she feels that she sleeps well.  -she has note been sleeping well while using the CPAP mask and was told that O2 is leaking -she is working with a respiratory therapist -she feels anxiety about whether she will get enough sleep tonight, she has only been sleeping about 4 hours since using the CPAP -she asks about [name] as she saw an ad for this on TV   Pain Inventory Average Pain 0 Pain Right Now 0 My pain is  no pain.  Sometimes spasm in legs at night because of MST  In the last 24 hours, has pain interfered with the following? General activity  n/a Relation with others  n/a Enjoyment of life  n/a  What TIME of day is your pain at its worst? night Sleep (in general) Good  Pain is worse with: unsure Pain improves with: moving around Relief from Meds:  n/a  Family History  Problem Relation Age of Onset   Arthritis Mother    Hypertension Mother    Macular degeneration Mother    Hypertension Father    Hyperlipidemia Father    Heart disease Maternal Grandfather    Diabetes Maternal Grandfather    Kidney disease Paternal Grandmother    Social History   Socioeconomic History   Marital status: Married    Spouse name: Engineer, technical sales   Number of children: 3   Years of education: 14   Highest education level: Associate degree: academic program  Occupational History   Not on file  Tobacco Use   Smoking status: Never   Smokeless tobacco: Never  Vaping Use   Vaping Use: Never used  Substance and Sexual Activity   Alcohol use: No    Alcohol/week: 0.0 standard drinks   Drug use: No   Sexual activity: Not Currently  Other Topics Concern   Not on file  Social History Narrative   Lives with husband    Right handed   Caffeine: 1 cup of coffee in the AM, coke occa. Trying to come off caffeine    Social Determinants of Health   Financial Resource  Strain: Low Risk    Difficulty of Paying Living Expenses: Not hard at all  Food Insecurity: No Food Insecurity   Worried About Charity fundraiser in the Last Year: Never true   [place] in the Last Year: Never true  Transportation Needs: No Transportation Needs   Lack of Transportation (Medical): No   Lack of Transportation (Non-Medical): No  Physical Activity: Not on file  Stress: No Stress Concern Present   Feeling of Stress : Not at all  Social Connections: Unknown   Frequency of Communication with Friends and Family: Not on file   Frequency of Social Gatherings with Friends and Family: Not on file   Attends Religious Services: Not on file   Active Member of  Clubs or Organizations: Not on file   Attends Club or Organization Meetings: Not on file   Marital Status: Married   Past Surgical History:  Procedure Laterality Date   BACK SURGERY     CHOLECYSTECTOMY     COLONOSCOPY WITH PROPOFOL N/A 05/18/2017   Procedure: COLONOSCOPY WITH PROPOFOL;  Surgeon: [name], MD;  Location: [facility] ENDOSCOPY;  Service: Endoscopy;  Laterality: N/A;   FOOT SURGERY  2015   GALLBLADDER SURGERY  2008   HARDWARE REMOVAL Left 02/14/2016   Procedure: LEFT FOOT REMOVAL DEEP IMPLANT;  Surgeon: [name], MD;  Location: [place];  Service: Orthopedics;  Laterality: Left;   HERNIA REPAIR     Inguinal Hernia Repair   SPINE SURGERY  2014   Past Surgical History:  Procedure Laterality Date   BACK SURGERY     CHOLECYSTECTOMY     COLONOSCOPY WITH PROPOFOL N/A 05/18/2017   Procedure: COLONOSCOPY WITH PROPOFOL;  Surgeon: [name], MD;  Location: [facility] ENDOSCOPY;  Service: Endoscopy;  Laterality: N/A;   FOOT SURGERY  2015   GALLBLADDER SURGERY  2008   HARDWARE REMOVAL Left 02/14/2016   Procedure: LEFT FOOT REMOVAL DEEP IMPLANT;  Surgeon: [name], MD;  Location: [place];  Service: Orthopedics;  Laterality: Left;   HERNIA REPAIR     Inguinal Hernia  Repair   SPINE SURGERY  2014   Past Medical History:  Diagnosis Date   Allergy    Anxiety    Aspiration pneumonia (HCC)    Depression    Frequent headaches    H/O   GERD (gastroesophageal reflux disease)    History of chicken pox    History of colon polyps    Hx of migraines    Multiple sclerosis ([place]) 2011   OSA (obstructive sleep apnea)    PONV (postoperative nausea and vomiting)    There were no vitals taken for this visit.  Opioid Risk Score:   Fall Risk Score:  `1  Depression screen PHQ 2/9  Depression screen [facility] 2/9 08/27/2021 08/15/2021 07/19/2021 06/18/2021 05/31/2021 03/28/2021 11/02/2020  Decreased Interest 2 0 1 3 3  0 0  Down, Depressed, Hopeless 2 0 1 3 3  0 0  PHQ - 2 Score 4 0 2 6 6  0 0  Altered sleeping - - - - - 0 -  Tired, decreased energy - - - - - 0 -  Change in appetite - - - - - 0 -  Feeling bad or failure about yourself  - - - - - 0 -  Trouble concentrating - - - - - 0 -  Moving slowly or fidgety/restless - - - - - 0 -  Suicidal thoughts - - - - - 0 -  PHQ-9 Score - - - - - 0 -  Difficult doing work/chores - - - - - Not difficult at all -  Some recent data might be hidden    Review of Systems  Constitutional: Negative.   HENT: Negative.    Eyes: Negative.   Respiratory:  Negative for cough.        Chest congestion  Cardiovascular: Negative.   Gastrointestinal: Negative.   Endocrine: Negative.   Genitourinary: Negative.   Musculoskeletal:  Positive for back pain and gait problem.  Skin: Negative.   Allergic/Immunologic: Negative.   Hematological:  Bruises/bleeds easily.       Plavix  Psychiatric/Behavioral:  Positive for dysphoric mood.   All other systems reviewed and are negative.  Objective:   Physical Exam Not performed as patient was seen via phone.      Assessment & Plan:  1) Anxiety: -discussed with patient the side effects of both Seroquel and Lorazepam, as well as potential interactions with other medications. -may increase  seroquel to 25mg  4 times per day PRN -advised to always use the lowest dose of medications that she needs to minimize side effects -discussed the buspar that was started for her by Dr. Kerman Passey- she has not yet noted benefits from this, advised that she does not need to take it if she does not feel benefits.  -referred to psychiatry in [facility] for CBT -reinforced importance of thinking positively.  -continue to encourage non-pharmacologic management approaches as well, such as meditation, applying lavender oil, and exercise- all of which she has been trying and which have been helping.  -discussed alternative anxiety medications.  -continue seroquel 12.5mg  daily and 25mg  at night. Can decreased to 25mg  at night and assess positive/negative effects during the day. Can restart 1/2 tab during the day if anxiety is uncontrolled.  -discontinue lorazepam.  -establish care with [name] for neuropsych counseling on 8/2. They really valued the appointment with him.  -discontinue topamax in case pain in right side is due to medication- discussed can also be due to antibiotics which she is taking for respiratory infection- discontinuing the topamax will help [place] know if it is the cause of her pain or not. -continue [name] 30mg  HS.  -Discussed exercise and meditation as tools to decrease anxiety. -Recommended Down Dog Yoga app -Discussed spending time outdoors. -Discussed positive re-framing of anxiety.  -Discussed the following foods that have been show to reduce anxiety: 1) [place] nuts, mushrooms, soy beans due to their high selenium content. Upper limit of toxicity of selenium is 448mcg/day so no more than 3-4 [place] nuts per day.  2) Fatty fish such as salmon, mackerel, sardines, trout, and herring- high in omega-3 fatty acids 3) Eggs- increases serotonin and dopamine 4) Pumpkin seeds- high in omega-3 fatty acids 5) dark chocolate- high in flavanols that increase blood flow to brain 6)  turmeric- take with black pepper to increase absorption 7) chamomile tea- antioxidant and anti-inflammatory properties 8) yogurt without sugar- supports gut-brain axis 9) green tea- contains L- theanine 10) blueberries- high in vitamin C and antioxidants 11) [place]- high in tryptophan which gets converted to serotonin 12) bell peppers- rich in vitamin C and antioxidants 13) citrus fruits- rich in vitamin C and antioxidants 14) almonds- high in vitamin E and healthy fats 15) chia seeds- high in omega-3 fatty acids  2) MS -continue home therapy to minimize stiffness/maximize mobility -f/u with MS specialist.  -provided with neurology referral  3) HTN: BP has been 130s-150s/80s, recommended citrus foods and nuts, as much mobility as she can tolerate, log Bps daily and bring log to f/u appointment.  -continue current regimen and checking and checking BP.  -advised citrus foods and nuts to help lower BP. Discussed that once BP is consistently 120/80 we can wean her Losartan to 25mg .   4) Bilateral lower extremity edema: - continue lymphedema therapy which is greatly helping.  -discussed response to Lasix. Increase Lasix to 40mg .   5) Insomnia: -recommended tart cherry juice with dinner, valerian root and chamomile teas in evening, applying lavender drops to forehead at night. -continue [name] 30mg  HS- discussed that we will not try to wean off again since we saw how much this is benefittng her  6) Obesity BMI 33.13 -  attempted to wean off [name] but she developed worsening insomnia, anxiety, and decreased appetite that was undesirable to her, so have restarted the medication.  -discussed trial of avoiding gluten for 3 months and how this could potentially help with her weight loss, anxiety, and prevention/slowing of her chronic diseases. -discussed benefits of intermittent fasting- made goal to set a consistent dinner time and avoid snacking after this time. If she is hungry,  choose a healthy snack of nuts, fruit, or vegetables.   7) Chest congestion -referred for pulmonary consult with [name], who treated her father for pulmonary fibrosis. She has made an appointment to establish care with him.  -recommended drinking daily warm water with honey, lime and ginger -recommended drinking tea with holy Basil (Tulsi)  8) Sore throat: -continue salt water gargling  9) Sinus congestion: -recommended humidifier -drink 6-8 glasses of water per day.   10) fatigue -checked B12, folate, TSH, vitamin D, magnesium and discussed bloodwork with patient and husband: all in normal range except for suboptimal Vitamin D- high dose supplement prescribed  11) OSA -discussed that OSA can contribute to daytime fatigue, weight gain, anxiety, and chronic conditions so it is good to get this condition treated. -referred to Dr. Melida Quitter for eval for [facility] given that she is having a hard time sleeping with her CPAP. -advised her to talk to her respiratory therapist about fitting another mask  24 minutes spent in discussion of her OSA, referring her to pulmonologist for [facility] evaluation, recommending she speak with her respiratory therapist regarding trying a different CPAP mask, referring her to psychiatry in [place] for CBT, discussed her upcoming move and the importance of thinking positively

## 2021-10-23 ENCOUNTER — Other Ambulatory Visit: Payer: Self-pay

## 2021-10-23 ENCOUNTER — Ambulatory Visit: Payer: Medicare PPO | Admitting: Physical Therapy

## 2021-10-23 ENCOUNTER — Telehealth: Payer: Self-pay

## 2021-10-23 ENCOUNTER — Ambulatory Visit: Payer: Medicare PPO | Admitting: Occupational Therapy

## 2021-10-23 DIAGNOSIS — I890 Lymphedema, not elsewhere classified: Secondary | ICD-10-CM

## 2021-10-23 NOTE — Telephone Encounter (Signed)
[facility] is calling to ask if the patient has to have brand name seroquel because she has been taking the generic with no problems and it is much cheaper. Please advise

## 2021-10-23 NOTE — Therapy (Addendum)
[place] MAIN [facility] SERVICES [address] [place], [place], [address] Phone: [phone]   Fax:  [phone]  Patient Details  Name: Carly [name] MRN: [id number] Date of Birth: 1951-08-24 Referring Provider:  Einar Pheasant, MD  Encounter Date: 10/23/2021  Pt accompanied by her spouse and paid caregiver today. She arrives seated in manual wc with recommended compression stockings in place. Pt expresses concern that feet are more swollen lately. Upon assessment It's obvious that "Karle Starch" style shoes  continue to cause increased  fluid accumulation on dorsal feet. Pt is very tearful throughout visit. She states she is feeling very overwhelmed about her upcoming move with her spouse out of her home to a CCRT ([place]). Guided Pt to perform diaphragmatic breathing for relaxation, which temporarily reduced anxiety. Suggested Pt go shopping at her favorite shoe store, while waiting for her online order to arrive, and buy a pair of shoes that covers the top of her feet to better contain swelling.  Also recommended Pt discuss her increased difficulty managing anxiety during this stressful time. Pt planning to attend [facility] stroke support group in 2 weeks. No charge for today's visit.   Andrey Spearman, MS, OTR/L, [facility] 11/07/21 12:40 PM    [place] [facility] SERVICES [address] [place], [place], [address] Phone: [phone]   Fax:  [phone]

## 2021-10-23 NOTE — Telephone Encounter (Signed)
Left Message to call office.

## 2021-10-23 NOTE — Telephone Encounter (Signed)
Spoke with Altha Harm at [facility] and changed Seroquel to generic.

## 2021-10-23 NOTE — Telephone Encounter (Signed)
Carly [name] from [place] called to ask the directions for the Seroquel. Informed her that it was increased to 1 tablet 4 times a day PRN

## 2021-10-24 ENCOUNTER — Ambulatory Visit: Payer: Medicare PPO

## 2021-10-24 ENCOUNTER — Telehealth: Payer: Self-pay | Admitting: Internal Medicine

## 2021-10-24 ENCOUNTER — Ambulatory Visit: Payer: Medicare Other | Admitting: Physical Therapy

## 2021-10-24 DIAGNOSIS — R053 Chronic cough: Secondary | ICD-10-CM

## 2021-10-24 NOTE — Telephone Encounter (Signed)
Are you ok with me ordering cxr for her to have done?

## 2021-10-24 NOTE — Telephone Encounter (Signed)
Ok to order cxr.  Need to confirm reason - I assume persistent cough.

## 2021-10-24 NOTE — Telephone Encounter (Signed)
Patient called and wanted Dr Nicki Reaper to know she does want a chest xray now.

## 2021-10-25 ENCOUNTER — Telehealth: Payer: Self-pay | Admitting: Neurology

## 2021-10-25 ENCOUNTER — Ambulatory Visit: Payer: Medicare PPO

## 2021-10-25 NOTE — Telephone Encounter (Signed)
Spoke with patient. Explained that she would need to call Dr Felecia Shelling and make medical clearance appt to discuss stopping medications for procedures.Patient says that she has only seen him once and would like for me to call and make appt. Spoke with receptionist and was told that the first available appt was 7/18 at 11:00. Scheduled appt and sent message back to nurse to see if sooner appt is available. Patient aware of appt date and time. Will let them know if sooner appt is available.

## 2021-10-25 NOTE — Telephone Encounter (Signed)
Cxr ordered for medical mall. Patient is aware

## 2021-10-25 NOTE — Telephone Encounter (Signed)
Carly [name] Carly [name]) requesting surgical clearance for Colonoscopy to stop  MS medications and Plavix. Have scheduled an appt for 04/29/22 at 11:30am  Would like call from the nurse to discuss a sooner appt.

## 2021-10-25 NOTE — Telephone Encounter (Signed)
LMTCB

## 2021-10-25 NOTE — Addendum Note (Signed)
Addended by: Lars Masson on: 10/25/2021 10:13 AM   Modules accepted: Orders

## 2021-10-26 ENCOUNTER — Other Ambulatory Visit: Payer: Self-pay | Admitting: Physical Medicine and Rehabilitation

## 2021-10-28 ENCOUNTER — Other Ambulatory Visit: Payer: Self-pay

## 2021-10-28 ENCOUNTER — Ambulatory Visit
Admission: RE | Admit: 2021-10-28 | Discharge: 2021-10-28 | Disposition: A | Discharge status: Home / Self Care | Payer: Medicare PPO | Source: Ambulatory Visit | Attending: *Deleted | Admitting: *Deleted

## 2021-10-28 ENCOUNTER — Ambulatory Visit: Payer: Medicare PPO

## 2021-10-28 ENCOUNTER — Ambulatory Visit
Admission: RE | Admit: 2021-10-28 | Discharge: 2021-10-28 | Disposition: A | Discharge status: Home / Self Care | Payer: Medicare PPO | Source: Ambulatory Visit | Attending: Internal Medicine | Admitting: Internal Medicine

## 2021-10-28 DIAGNOSIS — R053 Chronic cough: Secondary | ICD-10-CM | POA: Insufficient documentation

## 2021-10-28 DIAGNOSIS — R2681 Unsteadiness on feet: Secondary | ICD-10-CM

## 2021-10-28 DIAGNOSIS — M6281 Muscle weakness (generalized): Secondary | ICD-10-CM

## 2021-10-28 DIAGNOSIS — R059 Cough, unspecified: Secondary | ICD-10-CM | POA: Diagnosis not present

## 2021-10-28 DIAGNOSIS — R262 Difficulty in walking, not elsewhere classified: Secondary | ICD-10-CM | POA: Insufficient documentation

## 2021-10-28 DIAGNOSIS — R269 Unspecified abnormalities of gait and mobility: Secondary | ICD-10-CM

## 2021-10-28 NOTE — Telephone Encounter (Signed)
[name]- I noticed she was just seen in December. Is she cleared for colonoscopy? Looks like you were holding her Ocrevus infusions? Are you ok w/ her stopping plavix? If so, when and when should she resume?

## 2021-10-28 NOTE — Therapy (Signed)
[place] MAIN [facility] SERVICES [address] [place], [place], [address] Phone: [phone]   Fax:  [phone]  Physical Therapy Treatment  Patient Details  Name: Carly [name] MRN: [id number] Date of Birth: Aug 09, 1951 Referring Provider (PT): Einar Pheasant MD   Encounter Date: 10/28/2021   PT End of Session - 10/28/21 1018     Visit Number 14    Number of Visits 24    Date for PT Re-Evaluation 10/31/21    Authorization Time Period 08/08/21-10/31/20    Progress Note Due on Visit 20    PT Start Time 1012    PT Stop Time 1059    PT Time Calculation (min) 47 min    Equipment Utilized During Treatment Gait belt    Activity Tolerance Patient tolerated treatment well    Behavior During Therapy [facility] for tasks assessed/performed             Past Medical History:  Diagnosis Date   Allergy    Anxiety    Aspiration pneumonia ([place])    Depression    Frequent headaches    H/O   GERD (gastroesophageal reflux disease)    History of chicken pox    History of colon polyps    Hx of migraines    Multiple sclerosis ([place]) 2011   OSA (obstructive sleep apnea)    PONV (postoperative nausea and vomiting)     Past Surgical History:  Procedure Laterality Date   BACK SURGERY     CHOLECYSTECTOMY     COLONOSCOPY WITH PROPOFOL N/A 05/18/2017   Procedure: COLONOSCOPY WITH PROPOFOL;  Surgeon: [name], MD;  Location: [facility] ENDOSCOPY;  Service: Endoscopy;  Laterality: N/A;   FOOT SURGERY  2015   GALLBLADDER SURGERY  2008   HARDWARE REMOVAL Left 02/14/2016   Procedure: LEFT FOOT REMOVAL DEEP IMPLANT;  Surgeon: [name], MD;  Location: [place];  Service: Orthopedics;  Laterality: Left;   HERNIA REPAIR     Inguinal Hernia Repair   SPINE SURGERY  2014    There were no vitals filed for this visit.   Subjective Assessment - 10/28/21 1017     Subjective Patient reports feeling okay today but reports being stressed due  to potential moving from her home into apartment at the [place].    Patient is accompained by: Family member    Pertinent History Pt reports she is in PT on order to allow for improvement with walking. Pt reports she has not been able to get [facility] PT because she is being seen by outpatient OT services. Pt reports she was walking  and her caregiver who helped her walk had a heart attack. Pt reports the last time she was walking regularly was about 2 years ago. She reports she gets up to [facility] with some assistance from her husband. Pt has walker, rollator and WC at home. Pt reports she used them often until her caregiver had the heart attack. Pt switched to new MS medication a couple years ago and attributes some of her dificits to the medicaiton change. Pt has significant difficulty with any activities involving standing. Pt lives in home wiht ramp to enter. House is one story with an upstairs but she lives primarilly on the first floor. Pt has shower which she can access. 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 She has weighted bracelets she has been using for arm strength. Pt reports she has not been to outpatient  PT in the past but she has had HH in the past. Pt reports swimming in past for exercise. She also reports she is moving to [facility] in January.    Limitations Standing;Walking    How long can you stand comfortably? 15 min.    How long can you walk comfortably? 10 min.    Patient Stated Goals Standing, walking, transfers    Currently in Pain? No/denies    Pain Location --   stomach   Pain Onset --   leg pain and swelling first noticed during long care trip ~ 7 hours              INTERVENTIONS:   Therapeutic Exercises:   Chair Dips:- Instruction to lift her bottom up off seat of chair using B UE support on armrest- Patient able to perform 6 reps initially prior to fatigue and then 4 more with minimal clearance of bottom.   Hip march BLE x 12  reps - more AA on left LE Active knee ext BLE x 12 reps  Active toe raises (minimal ability on left LE) x 12 reps Active Heel raises BLE x 12 reps  Gait in // bars - using BUE support x 2 trials- 1 way down to end of // bars. On 2nd set- patient able to walk to end of bars then transition to RW and walk a total distance of 44 feet.   Gait with RW- patient able to walk a distance of 25 feet (initially with patient counting every step) - shuffling gait with minimal toe clearance. Patient required 36 B steps to complete distance.  As  she was resting - provided cues to try again for same distance and count her steps trying to accomplish the same distance in less steps. She was able to complete another set of 25 feet using her RW, CGA with close w/c in 24 total steps exhibiting much improved step length bilateral. Patient was pleased with progress and stated excited to be walking.   W/C propulsion education: Reviewed hand placement and importance on using BLE as well - VC to dig heels into floor and bend knees. Patient exhibited max difficulty performing today and will benefit from continued practice.   Education provided throughout session via VC/TC and demonstration to facilitate movement at target joints and correct muscle activation for all testing and exercises performed.                           PT Education - 10/28/21 1018     Education provided Yes    Education Details Exercise technique    Person(s) Educated Patient    Methods Explanation;Demonstration;Tactile cues;Verbal cues    Comprehension Verbalized understanding;Returned demonstration;Verbal cues required;Tactile cues required;Need further instruction              PT Short Term Goals - 09/25/21 1106       PT SHORT TERM GOAL #1   Title Patient will be independent in home exercise program to improve strength/mobility for better functional independence with ADLs.    Baseline Pt does not have HEP;  09/25/2021- Patient reports compliant wth HEP consisting of LE strengthening.    Time 4    Period Weeks    Status Achieved    Target Date 09/05/21      PT SHORT TERM GOAL #2   Title Patient will increase FOTO score to equal to or greater than 4    to  demonstrate statistically significant improvement in mobility and quality of life.    Baseline 28; 09/25/2021- will reassess next visit    Time 4    Period Weeks    Target Date 09/05/21               PT Long Term Goals - 09/25/21 1108       PT LONG TERM GOAL #1   Title Patient will be independent with advanced and progressive home program for strength and endurance in order to transition to self management    Baseline Pt does not have current formal HEP; 09/25/2021- Patient does report compliance with initial HEP consisting on seated therex for LE strengthening.    Time 4    Period Weeks    Status On-going    Target Date 10/03/21      PT LONG TERM GOAL #2   Title pt's FOTO score will improve by 4 points or more indicating improved confidence with daily tasks at home    Baseline 28 at intake: 09/25/2021- Will assess next visit    Time 6    Period Weeks    Status New      PT LONG TERM GOAL #3   Title Patient will deny any falls over past 6 weeks to demonstrate improved safety awareness at home and work.    Baseline Pt reports 3 falls in previous 6 months; 09/25/2021= No falls reported and patient has been focusing on walking  some in clinic.    Time 6    Period Weeks    Status Achieved      PT LONG TERM GOAL #4   Title Patient will perform sit to and from stand from her wheelchair with min assist and no upper extremity support in order to indicate improvement in function.    Baseline Patient requires mod assist from author and bilateral lower extremity support to transition to and from sitting. 09/25/2021- Patient continues to require repetitive VC, TC, and min physical assistance to stand as wel as B UE Support.    Time 12     Period Weeks    Status On-going    Target Date 10/31/21      PT LONG TERM GOAL #5   Title Pt will increase 10MWT by at least 0.13 m/s in order to demonstrate clinically significant improvement in community ambulation.    Baseline 09/25/2021=10 MWT= 0.045 m/s using front wheeled walker    Time 5    Period Weeks    Status New    Target Date 10/31/21      Additional Long Term Goals   Additional Long Term Goals Yes      PT LONG TERM GOAL #6   Title Patient will ambulate on level surfaces using front wheeled walker with CGA > 200 feet without rest break or loss of balance for improved ability for household ambulation.    Baseline 09/25/2021= Patient was able to walk 32 feet today with front wheeled walker, Min assist with close w/c follow with decreased heel to toe and decreased step length.    Time 5    Period Weeks    Status New    Target Date 10/31/21                   Plan - 10/28/21 1019     Clinical Impression Statement Patient presented with good motivation for today's session. Treatment today focused on some LE strengthening and gait training. Patient responded well to all cues  today and able to improve her gait sequencing. Patient performed better once she counted her steps and improved on her second trial. The pt will benefit from further skilled PT to improve LE strength and ambulatory capacity in order to increase QOL.    Personal Factors and Comorbidities Age;Comorbidity 1;Comorbidity 2;Comorbidity 3+;Time since onset of injury/illness/exacerbation    Comorbidities Depression, anxiety, HTN, MS, DDD, GERD    Examination-Activity Limitations Bed Mobility;Carry;Continence;Dressing;Hygiene/Grooming;Lift;Locomotion Level;Squat;Stairs;Stand;Toileting;Transfers    Examination-Participation Restrictions Cleaning;Community Activity;Laundry;Meal Prep;Shop    Stability/Clinical Decision Making Evolving/Moderate complexity    Rehab Potential Fair    Clinical Impairments  Affecting Rehab Potential (+) family support, motivated (-) MS, decreased confidence, co morbidities    PT Frequency 2x / week    PT Duration 12 weeks    PT Treatment/Interventions Patient/family education;Gait training;Neuromuscular re-education;Therapeutic exercise;Manual techniques;ADLs/Self Care Home Management;Stair training;DME Instruction;Functional mobility training;Therapeutic activities;Balance training;Wheelchair mobility training;Energy conservation;Joint Manipulations    PT Next Visit Plan gait in // bars, strength, endurance; continue POC as previously indicated    PT Home Exercise Plan Access Code: [email or web address]; no updates; 1/9: Access Code: [email or web address]    Consulted and Agree with Plan of Care Patient             Patient will benefit from skilled therapeutic intervention in order to improve the following deficits and impairments:  Decreased strength, Decreased balance, Decreased activity tolerance, Decreased endurance, Difficulty walking, Abnormal gait, Improper body mechanics, Decreased mobility, Hypomobility  Visit Diagnosis: Abnormality of gait and mobility  Difficulty in walking, not elsewhere classified  Muscle weakness (generalized)  Unsteadiness on feet     Problem List Patient Active Problem List   Diagnosis Date Noted   Pre-op evaluation 08/31/2021   Rash 01/27/2021   History of CVA (cerebrovascular accident) 01/05/2021   PBA (pseudobulbar affect)    MS (multiple sclerosis) (HCC)    Anxiety and depression    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   Generalized anxiety disorder 12/11/2020   Acute focal neurological deficit 12/08/2020   Edema 09/28/2020   Bloating 08/25/2020   Environmental allergies 07/17/2019   Hyperglycemia 07/17/2019   Cough 06/06/2019   Memory change 05/29/2019   Abnormal liver function tests 01/09/2019   Hemorrhoid 09/27/2018   Nausea 08/11/2018   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   Depression with anxiety  02/04/2018   Cognitive and behavioral changes 02/04/2018   Constipation 11/17/2017   Hx of adenomatous colonic polyps 06/06/2016   Lower extremity edema 01/13/2016   Bilateral ovarian cysts 10/16/2015   Health care maintenance 10/16/2015   Colon polyp 01/25/2015   Bone/cartilage disorder 01/25/2015   Headache 11/18/2014   Depression, recurrent ([place]) 11/18/2014   Greater tuberosity of humerus fracture 11/18/2014   Multiple sclerosis ([place]) 11/18/2014   Unsteady gait 11/18/2014   Dizziness 11/18/2014   Inconclusive mammogram 03/15/2013   S/P lumbar spinal fusion 01/04/2013   Surgery, other elective 12/03/2012   DDD (degenerative disc disease), lumbosacral 10/15/2012   Back ache 09/06/2012   Hypertension 09/06/2012   Back pain 09/06/2012   Hypercholesterolemia 01/28/2012   Climacteric 01/28/2012   Routine general medical examination at a health care facility 01/28/2012   Avitaminosis D 01/28/2012    Lewis Moccasin, PT 10/28/2021, 5:27 PM  [place] MAIN [facility] SERVICES [address] [place], [place], [address] Phone: [phone]   Fax:  [phone]  Name: BRANTLEE HINDE MRN: [id number] Date of Birth: 1951/09/29

## 2021-10-29 ENCOUNTER — Ambulatory Visit: Payer: Medicare Other | Admitting: Physical Therapy

## 2021-10-29 ENCOUNTER — Encounter: Payer: Self-pay | Admitting: Neurology

## 2021-10-29 NOTE — Telephone Encounter (Signed)
Called the office back. There was no one there available to take my call. Left a message with the phone staff stating from Dr Felecia Shelling standpoint, he just saw her in December. Pt is medically cleared for colonoscopy and instructions were sent to the pt via mychart as well in regards to holding plavix prior to procedure.   Advised they could call back with any questions.

## 2021-10-29 NOTE — Telephone Encounter (Signed)
Myriam Jacobson called from [facility], [phone]. She is cleared for surgery.

## 2021-10-30 DIAGNOSIS — M5413 Radiculopathy, cervicothoracic region: Secondary | ICD-10-CM | POA: Diagnosis not present

## 2021-10-30 DIAGNOSIS — M9901 Segmental and somatic dysfunction of cervical region: Secondary | ICD-10-CM | POA: Diagnosis not present

## 2021-10-30 DIAGNOSIS — M79602 Pain in left arm: Secondary | ICD-10-CM | POA: Diagnosis not present

## 2021-10-30 DIAGNOSIS — M9900 Segmental and somatic dysfunction of head region: Secondary | ICD-10-CM | POA: Diagnosis not present

## 2021-10-30 DIAGNOSIS — M7918 Myalgia, other site: Secondary | ICD-10-CM | POA: Diagnosis not present

## 2021-10-30 DIAGNOSIS — R519 Headache, unspecified: Secondary | ICD-10-CM | POA: Diagnosis not present

## 2021-10-30 DIAGNOSIS — M79601 Pain in right arm: Secondary | ICD-10-CM | POA: Diagnosis not present

## 2021-10-30 DIAGNOSIS — M5412 Radiculopathy, cervical region: Secondary | ICD-10-CM | POA: Diagnosis not present

## 2021-10-30 DIAGNOSIS — M542 Cervicalgia: Secondary | ICD-10-CM | POA: Diagnosis not present

## 2021-10-31 ENCOUNTER — Ambulatory Visit: Payer: Medicare PPO

## 2021-10-31 ENCOUNTER — Other Ambulatory Visit: Payer: Self-pay

## 2021-10-31 DIAGNOSIS — R269 Unspecified abnormalities of gait and mobility: Secondary | ICD-10-CM | POA: Diagnosis not present

## 2021-10-31 DIAGNOSIS — R262 Difficulty in walking, not elsewhere classified: Secondary | ICD-10-CM

## 2021-10-31 DIAGNOSIS — R2681 Unsteadiness on feet: Secondary | ICD-10-CM

## 2021-10-31 DIAGNOSIS — M6281 Muscle weakness (generalized): Secondary | ICD-10-CM

## 2021-10-31 NOTE — Therapy (Signed)
[place] MAIN [facility] SERVICES [address] [place], [place], [address] Phone: [phone]   Fax:  [phone]  Physical Therapy Treatment/Recertification for dates 10/31/2021-01/23/2022  Patient Details  Name: Carly [name] MRN: [id number] Date of Birth: Apr 03, 1951 Referring Provider (PT): Einar Pheasant MD   Encounter Date: 10/31/2021   PT End of Session - 10/31/21 1401     Visit Number 15    Number of Visits 24    Date for PT Re-Evaluation 10/31/21    Authorization Time Period 08/08/21-10/31/20    Progress Note Due on Visit 20    PT Start Time 1353    PT Stop Time 1432    PT Time Calculation (min) 39 min    Equipment Utilized During Treatment Gait belt    Activity Tolerance Patient tolerated treatment well    Behavior During Therapy WFL for tasks assessed/performed             Past Medical History:  Diagnosis Date   Allergy    Anxiety    Aspiration pneumonia ([place])    Depression    Frequent headaches    H/O   GERD (gastroesophageal reflux disease)    History of chicken pox    History of colon polyps    Hx of migraines    Multiple sclerosis ([place]) 2011   OSA (obstructive sleep apnea)    PONV (postoperative nausea and vomiting)     Past Surgical History:  Procedure Laterality Date   BACK SURGERY     CHOLECYSTECTOMY     COLONOSCOPY WITH PROPOFOL N/A 05/18/2017   Procedure: COLONOSCOPY WITH PROPOFOL;  Surgeon: [name], MD;  Location: [facility] ENDOSCOPY;  Service: Endoscopy;  Laterality: N/A;   FOOT SURGERY  2015   GALLBLADDER SURGERY  2008   HARDWARE REMOVAL Left 02/14/2016   Procedure: LEFT FOOT REMOVAL DEEP IMPLANT;  Surgeon: [name], MD;  Location: [place];  Service: Orthopedics;  Laterality: Left;   HERNIA REPAIR     Inguinal Hernia Repair   SPINE SURGERY  2014    There were no vitals filed for this visit.   Subjective Assessment - 10/31/21 1354     Subjective Patient reports  having some right sided low back pain 3/10 and states she has some new shoes for her ongoing swelling but did not bring in today.    Patient is accompained by: Family member    Pertinent History Pt reports she is in PT on order to allow for improvement with walking. Pt reports she has not been able to get [facility] PT because she is being seen by outpatient OT services. Pt reports she was walking  and her caregiver who helped her walk had a heart attack. Pt reports the last time she was walking regularly was about 2 years ago. She reports she gets up to [facility] with some assistance from her husband. Pt has walker, rollator and WC at home. Pt reports she used them often until her caregiver had the heart attack. Pt switched to new MS medication a couple years ago and attributes some of her dificits to the medicaiton change. Pt has significant difficulty with any activities involving standing. Pt lives in home wiht ramp to enter. House is one story with an upstairs but she lives primarilly on the first floor. Pt has shower which she can access. 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 She has weighted bracelets she has been using for arm strength. Pt  reports she has not been to outpatient PT in the past but she has had [place] in the past. Pt reports swimming in past for exercise. She also reports she is moving to [place] [facility] in January.    Limitations Standing;Walking    How long can you stand comfortably? 15 min.    How long can you walk comfortably? 10 min.    Patient Stated Goals Standing, walking, transfers    Currently in Pain? Yes    Pain Score 3     Pain Location Back    Pain Orientation Right;Left;Lower;Posterior    Pain Descriptors / Indicators Aching    Pain Type Chronic pain    Pain Onset More than a month ago   leg pain and swelling first noticed during long care trip ~ 7 hours   Pain Frequency Constant    Aggravating Factors  Stress- thinking about moving this weeken    Pain  Relieving Factors rest              Interventions:    Reassessed all long term goals:   FOTO= 24 (declined from 28)   Home program= Patient is performing initial HEP with seated LE strengthening but will benefit from progressive strengthening including standing/walking/balance (ongoing goal)   Sit to stand from wheelchair= Patient continues to present with LE weakness- requires BUE support on armrest as well as min physical assist to stand   10 MWT= 0.056 m/s using front wheeled walker (improved from 0.045 m/s)  Patient able to walk approx 45 feet with front wheeled walker with minimal step length and dragging right LE at times (improved from 32 feet)   .                     PT Education - 10/31/21 1357     Education provided Yes    Education Details Gait and Chief Financial Officer) Educated Patient    Methods Explanation;Demonstration;Tactile cues;Verbal cues    Comprehension Verbalized understanding;Returned demonstration;Verbal cues required;Tactile cues required;Need further instruction              PT Short Term Goals - 10/31/21 1409       PT SHORT TERM GOAL #1   Title Patient will be independent in home exercise program to improve strength/mobility for better functional independence with ADLs.    Baseline Pt does not have HEP; 09/25/2021- Patient reports compliant wth HEP consisting of LE strengthening.    Time 4    Period Weeks    Status Achieved    Target Date 09/05/21      PT SHORT TERM GOAL #2   Title Patient will increase FOTO score to equal to or greater than 4    to demonstrate statistically significant improvement in mobility and quality of life.    Baseline 28; 09/25/2021- will reassess next visit; 10/31/2021= 24    Time 6    Period Weeks    Status On-going    Target Date 12/12/21               PT Long Term Goals - 10/31/21 1411       PT LONG TERM GOAL #1   Title Patient will be independent with advanced and  progressive home program for strength and endurance in order to transition to self management    Baseline Pt does not have current formal HEP; 09/25/2021- Patient does report compliance with initial HEP consisting on seated therex for LE strengthening.  10/31/2021= Patient is performing initial HEP with seated LE strengthening but will benefit from progressive strengthening including standing/walking/balance    Time 12    Period Weeks    Status On-going    Target Date 01/23/22      PT LONG TERM GOAL #2   Title pt's FOTO score will improve by 4 points or more indicating improved confidence with daily tasks at home    Baseline 28 at intake: 09/25/2021- Will assess next visit; 10/31/2021= 24    Time 12    Period Weeks    Status On-going    Target Date 01/23/22      PT LONG TERM GOAL #3   Title Patient will deny any falls over past 6 weeks to demonstrate improved safety awareness at home and work.    Baseline Pt reports 3 falls in previous 6 months; 09/25/2021= No falls reported and patient has been focusing on walking  some in clinic.    Time 6    Period Weeks    Status Achieved      PT LONG TERM GOAL #4   Title Patient will perform sit to and from stand from her wheelchair with min assist and no upper extremity support in order to indicate improvement in function.    Baseline Patient requires mod assist from author and bilateral lower extremity support to transition to and from sitting. 09/25/2021- Patient continues to require repetitive VC, TC, and min physical assistance to stand as wel as B UE Support.  10/31/2021= Patient continues to present with LE weakness- requires BUE support on armrest as well as min physical assist to stand.    Time 12    Period Weeks    Status On-going    Target Date 01/23/22      PT LONG TERM GOAL #5   Title Pt will increase 10MWT by at least 0.13 m/s in order to demonstrate clinically significant improvement in community ambulation.    Baseline 09/25/2021=10  MWT= 0.045 m/s using front wheeled walker. 10/31/2021= 0.056 m/s using front wheeled walker    Time 12    Period Weeks    Status On-going    Target Date 01/23/22      PT LONG TERM GOAL #6   Title Patient will ambulate on level surfaces using front wheeled walker with CGA > 200 feet without rest break or loss of balance for improved ability for household ambulation.    Baseline 09/25/2021= Patient was able to walk 32 feet today with front wheeled walker, Min assist with close w/c follow with decreased heel to toe and decreased step length. 10/31/2021- Patient able to walk approx 45 feet with front wheeled walker with minimal step length and dragging right LE at times    Time 12    Period Weeks    Status New    Target Date 01/23/22                   Plan - 10/31/21 1514     Clinical Impression Statement Patient presents today with good motivation to test her progress today. She did score lower on her FOTO than original as she has realized since initiating PT that she could not do as much as she initially thought. She was also missed some visits limiting her potential to progress. She has been consistent with home program and has potential to improve her ability to transfer and walk. She did demonstrate improvement with gait velocity and distance. Patient's condition has the potential  to improve in response to therapy. Maximum improvement is yet to be obtained. The anticipated improvement is attainable and reasonable in a generally predictable time    Personal Factors and Comorbidities Age;Comorbidity 1;Comorbidity 2;Comorbidity 3+;Time since onset of injury/illness/exacerbation    Comorbidities Depression, anxiety, HTN, MS, DDD, GERD    Examination-Activity Limitations Bed Mobility;Carry;Continence;Dressing;Hygiene/Grooming;Lift;Locomotion Level;Squat;Stairs;Stand;Toileting;Transfers    Examination-Participation Restrictions Cleaning;Community Activity;Laundry;Meal Prep;Shop     Stability/Clinical Decision Making Evolving/Moderate complexity    Rehab Potential Fair    Clinical Impairments Affecting Rehab Potential (+) family support, motivated (-) MS, decreased confidence, co morbidities    PT Frequency 2x / week    PT Duration 12 weeks    PT Treatment/Interventions Patient/family education;Gait training;Neuromuscular re-education;Therapeutic exercise;Manual techniques;ADLs/Self Care Home Management;Stair training;DME Instruction;Functional mobility training;Therapeutic activities;Balance training;Wheelchair mobility training;Energy conservation;Joint Manipulations;Cryotherapy;Electrical Stimulation;Moist Heat;Passive range of motion    PT Next Visit Plan gait training, LE  strength training, endurance training    PT Home Exercise Plan Access Code: [email or web address]; no updates; 1/9: Access Code: [email or web address]    Consulted and Agree with Plan of Care Patient             Patient will benefit from skilled therapeutic intervention in order to improve the following deficits and impairments:  Decreased strength, Decreased balance, Decreased activity tolerance, Decreased endurance, Difficulty walking, Abnormal gait, Improper body mechanics, Decreased mobility, Hypomobility, Impaired flexibility, Impaired perceived functional ability, Decreased range of motion, Pain  Visit Diagnosis: Abnormality of gait and mobility  Difficulty in walking, not elsewhere classified  Muscle weakness (generalized)  Unsteadiness on feet     Problem List Patient Active Problem List   Diagnosis Date Noted   Pre-op evaluation 08/31/2021   Rash 01/27/2021   History of CVA (cerebrovascular accident) 01/05/2021   PBA (pseudobulbar affect)    MS (multiple sclerosis) (HCC)    Anxiety and depression    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   Generalized anxiety disorder 12/11/2020   Acute focal neurological deficit 12/08/2020   Edema 09/28/2020   Bloating 08/25/2020    Environmental allergies 07/17/2019   Hyperglycemia 07/17/2019   Cough 06/06/2019   Memory change 05/29/2019   Abnormal liver function tests 01/09/2019   Hemorrhoid 09/27/2018   Nausea 08/11/2018   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   Depression with anxiety 02/04/2018   Cognitive and behavioral changes 02/04/2018   Constipation 11/17/2017   Hx of adenomatous colonic polyps 06/06/2016   Lower extremity edema 01/13/2016   Bilateral ovarian cysts 10/16/2015   Health care maintenance 10/16/2015   Colon polyp 01/25/2015   Bone/cartilage disorder 01/25/2015   Headache 11/18/2014   Depression, recurrent ([place]) 11/18/2014   Greater tuberosity of humerus fracture 11/18/2014   Multiple sclerosis ([place]) 11/18/2014   Unsteady gait 11/18/2014   Dizziness 11/18/2014   Inconclusive mammogram 03/15/2013   S/P lumbar spinal fusion 01/04/2013   Surgery, other elective 12/03/2012   DDD (degenerative disc disease), lumbosacral 10/15/2012   Back ache 09/06/2012   Hypertension 09/06/2012   Back pain 09/06/2012   Hypercholesterolemia 01/28/2012   Climacteric 01/28/2012   Routine general medical examination at a health care facility 01/28/2012   Avitaminosis D 01/28/2012    Lewis Moccasin, PT 10/31/2021, 3:34 PM  [place] MAIN [facility] SERVICES [address] [place], [place], [address] Phone: [phone]   Fax:  [phone]  Name: DENAISHA SWANGO MRN: [id number] Date of Birth: 06/01/1951

## 2021-10-31 NOTE — Telephone Encounter (Signed)
I made an appointment with Dr Felecia Shelling for him to clear her for surgery in order to proceed with colonoscopy and cataract surgery.

## 2021-10-31 NOTE — Telephone Encounter (Signed)
Noted.  She should have a f/u appt scheduled with me (soon).  Can d/w her at her appt if desires.

## 2021-11-01 ENCOUNTER — Ambulatory Visit: Payer: Medicare Other

## 2021-11-04 ENCOUNTER — Ambulatory Visit: Payer: Medicare PPO | Admitting: Physical Therapy

## 2021-11-04 ENCOUNTER — Other Ambulatory Visit: Payer: Self-pay

## 2021-11-04 DIAGNOSIS — R269 Unspecified abnormalities of gait and mobility: Secondary | ICD-10-CM | POA: Diagnosis not present

## 2021-11-04 DIAGNOSIS — R262 Difficulty in walking, not elsewhere classified: Secondary | ICD-10-CM

## 2021-11-04 DIAGNOSIS — R2681 Unsteadiness on feet: Secondary | ICD-10-CM

## 2021-11-04 NOTE — Therapy (Signed)
[name] [facility] SERVICES [address] [place], [place], [address] Phone: [phone]   Fax:  [phone]  Physical Therapy Treatment  Patient Details  Name: Carly [name] MRN: [id number] Date of Birth: 1950-12-30 Referring Provider (PT): Einar Pheasant MD   Encounter Date: 11/04/2021   PT End of Session - 11/04/21 1334     Visit Number 16    Number of Visits 24    Date for PT Re-Evaluation 10/31/21    Authorization Time Period 08/08/21-10/31/20    Progress Note Due on Visit 20    PT Start Time 1300    PT Stop Time 1344    PT Time Calculation (min) 44 min    Equipment Utilized During Treatment Gait belt    Activity Tolerance Patient tolerated treatment well    Behavior During Therapy [facility] for tasks assessed/performed             Past Medical History:  Diagnosis Date   Allergy    Anxiety    Aspiration pneumonia ([place])    Depression    Frequent headaches    H/O   GERD (gastroesophageal reflux disease)    History of chicken pox    History of colon polyps    Hx of migraines    Multiple sclerosis ([place]) 2011   OSA (obstructive sleep apnea)    PONV (postoperative nausea and vomiting)     Past Surgical History:  Procedure Laterality Date   BACK SURGERY     CHOLECYSTECTOMY     COLONOSCOPY WITH PROPOFOL N/A 05/18/2017   Procedure: COLONOSCOPY WITH PROPOFOL;  Surgeon: [name], MD;  Location: [facility] ENDOSCOPY;  Service: Endoscopy;  Laterality: N/A;   FOOT SURGERY  2015   GALLBLADDER SURGERY  2008   HARDWARE REMOVAL Left 02/14/2016   Procedure: LEFT FOOT REMOVAL DEEP IMPLANT;  Surgeon: [name], MD;  Location: [place];  Service: Orthopedics;  Laterality: Left;   HERNIA REPAIR     Inguinal Hernia Repair   SPINE SURGERY  2014    There were no vitals filed for this visit.   Subjective Assessment - 11/04/21 1315     Subjective Pt reports 2 falls earlier in this day. reports they may be due to  her new shoes she just purchased. Pt reports her back is hurting.    Patient is accompained by: Family member    Pertinent History Pt reports she is in PT on order to allow for improvement with walking. Pt reports she has not been able to get [facility] PT because she is being seen by outpatient OT services. Pt reports she was walking  and her caregiver who helped her walk had a heart attack. Pt reports the last time she was walking regularly was about 2 years ago. She reports she gets up to [facility] with some assistance from her husband. Pt has walker, rollator and WC at home. Pt reports she used them often until her caregiver had the heart attack. Pt switched to new MS medication a couple years ago and attributes some of her dificits to the medicaiton change. Pt has significant difficulty with any activities involving standing. Pt lives in home wiht ramp to enter. House is one story with an upstairs but she lives primarilly on the first floor. Pt has shower which she can access. 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 She has weighted bracelets she has been using for arm strength. Pt reports she has not  been to outpatient PT in the past but she has had [place] in the past. Pt reports swimming in past for exercise. She also reports she is moving to [place] [facility] in January.    Limitations Standing;Walking    How long can you stand comfortably? 15 min.    How long can you walk comfortably? 10 min.    Patient Stated Goals Standing, walking, transfers    Pain Onset More than a month ago   leg pain and swelling first noticed during long care trip ~ 7 hours            Exercise/Activity Sets/Reps/Time/ Resistance Assistance Charge type Comments  Standing marching 2 x 10 ea  CGA, B UE  Therex  Increased difficulty with L LE march with foot clearance    Ambulation in // bars  3 laps  CGA, BUE Gait  First time through pt had adducted gait and her heels often crossed midline causing imbalance.  Provided pt with external cues and pt gait pattern improved but was quick to fatigue.   STS Multiple reps  CGA, B UE  therex   Turns 180 degrees  X multiple reps  CGA, BUE  Gait  Cues for picking up feet   Education/ self care    Pt educated along with caregiver if pt continues to fall to interchange shoes when she ambulates. Pt did demonstrate improved LE swelling with new shoes but her balance was slightly impaired compared to her previous baseline                                       Treatment Provided this session   Pt educated throughout session about proper posture and technique with exercises. Improved exercise technique, movement at target joints, use of target muscles after min to mod verbal, visual, tactile cues.  Note: Portions of this document were prepared using Dragon voice recognition software and although reviewed may contain unintentional dictation errors in syntax, grammar, or spelling.   Pt required occasional rest breaks due fatigue, PT was quick to ask when pt appeared to be fatiguing in order to prevent excessive fatigue.                          PT Education - 11/04/21 1327     Education provided Yes    Education Details gait safety with new shoes    Person(s) Educated Patient    Methods Explanation    Comprehension Verbalized understanding              PT Short Term Goals - 10/31/21 1409       PT SHORT TERM GOAL #1   Title Patient will be independent in home exercise program to improve strength/mobility for better functional independence with ADLs.    Baseline Pt does not have HEP; 09/25/2021- Patient reports compliant wth HEP consisting of LE strengthening.    Time 4    Period Weeks    Status Achieved    Target Date 09/05/21      PT SHORT TERM GOAL #2   Title Patient will increase FOTO score to equal to or greater than 4    to demonstrate statistically significant improvement in mobility and quality of life.    Baseline  28; 09/25/2021- will reassess next visit; 10/31/2021= 24    Time 6    Period Weeks  Status On-going    Target Date 12/12/21               PT Long Term Goals - 10/31/21 1411       PT LONG TERM GOAL #1   Title Patient will be independent with advanced and progressive home program for strength and endurance in order to transition to self management    Baseline Pt does not have current formal HEP; 09/25/2021- Patient does report compliance with initial HEP consisting on seated therex for LE strengthening. 10/31/2021= Patient is performing initial HEP with seated LE strengthening but will benefit from progressive strengthening including standing/walking/balance    Time 12    Period Weeks    Status On-going    Target Date 01/23/22      PT LONG TERM GOAL #2   Title pt's FOTO score will improve by 4 points or more indicating improved confidence with daily tasks at home    Baseline 28 at intake: 09/25/2021- Will assess next visit; 10/31/2021= 24    Time 12    Period Weeks    Status On-going    Target Date 01/23/22      PT LONG TERM GOAL #3   Title Patient will deny any falls over past 6 weeks to demonstrate improved safety awareness at home and work.    Baseline Pt reports 3 falls in previous 6 months; 09/25/2021= No falls reported and patient has been focusing on walking  some in clinic.    Time 6    Period Weeks    Status Achieved      PT LONG TERM GOAL #4   Title Patient will perform sit to and from stand from her wheelchair with min assist and no upper extremity support in order to indicate improvement in function.    Baseline Patient requires mod assist from author and bilateral lower extremity support to transition to and from sitting. 09/25/2021- Patient continues to require repetitive VC, TC, and min physical assistance to stand as wel as B UE Support.  10/31/2021= Patient continues to present with LE weakness- requires BUE support on armrest as well as min physical assist to  stand.    Time 12    Period Weeks    Status On-going    Target Date 01/23/22      PT LONG TERM GOAL #5   Title Pt will increase 10MWT by at least 0.13 m/s in order to demonstrate clinically significant improvement in community ambulation.    Baseline 09/25/2021=10 MWT= 0.045 m/s using front wheeled walker. 10/31/2021= 0.056 m/s using front wheeled walker    Time 12    Period Weeks    Status On-going    Target Date 01/23/22      PT LONG TERM GOAL #6   Title Patient will ambulate on level surfaces using front wheeled walker with CGA > 200 feet without rest break or loss of balance for improved ability for household ambulation.    Baseline 09/25/2021= Patient was able to walk 32 feet today with front wheeled walker, Min assist with close w/c follow with decreased heel to toe and decreased step length. 10/31/2021- Patient able to walk approx 45 feet with front wheeled walker with minimal step length and dragging right LE at times    Time 12    Period Weeks    Status New    Target Date 01/23/22                   Plan -  11/04/21 1453     Clinical Impression Statement Pt presents with good motivation for comletion of PT session at this time. Pt did have some difficulty with walking in // bars this session. Pt had increased dificulty with L LE foot clearance and also demonstrated adducted pattern of gait. With utilizing external cues to promote improved LE positioning pt demonstrated improved pattern of gait. Pt also demonstrated significant quickness to fatigue wioth ambulation in // bars and could only tolerate 2 lengths of // bars before requiring rest break. Pt will continue to benefit from skilled PT intervention in order to improve LE strenght, ambulatory capacity, strength, and balance in order to reduce her risk of falls and increase her QOL.    Personal Factors and Comorbidities Age;Comorbidity 1;Comorbidity 2;Comorbidity 3+;Time since onset of injury/illness/exacerbation     Comorbidities Depression, anxiety, HTN, MS, DDD, GERD    Examination-Activity Limitations Bed Mobility;Carry;Continence;Dressing;Hygiene/Grooming;Lift;Locomotion Level;Squat;Stairs;Stand;Toileting;Transfers    Examination-Participation Restrictions Cleaning;Community Activity;Laundry;Meal Prep;Shop    Stability/Clinical Decision Making Evolving/Moderate complexity    Rehab Potential Fair    Clinical Impairments Affecting Rehab Potential (+) family support, motivated (-) MS, decreased confidence, co morbidities    PT Frequency 2x / week    PT Duration 12 weeks    PT Treatment/Interventions Patient/family education;Gait training;Neuromuscular re-education;Therapeutic exercise;Manual techniques;ADLs/Self Care Home Management;Stair training;DME Instruction;Functional mobility training;Therapeutic activities;Balance training;Wheelchair mobility training;Energy conservation;Joint Manipulations;Cryotherapy;Electrical Stimulation;Moist Heat;Passive range of motion    PT Next Visit Plan gait training, LE  strength training, endurance training    PT Home Exercise Plan Access Code: [email or web address]; no updates; 1/9: Access Code: [email or web address]    Consulted and Agree with Plan of Care Patient             Patient will benefit from skilled therapeutic intervention in order to improve the following deficits and impairments:  Decreased strength, Decreased balance, Decreased activity tolerance, Decreased endurance, Difficulty walking, Abnormal gait, Improper body mechanics, Decreased mobility, Hypomobility, Impaired flexibility, Impaired perceived functional ability, Decreased range of motion, Pain  Visit Diagnosis: Abnormality of gait and mobility  Difficulty in walking, not elsewhere classified  Unsteadiness on feet     Problem List Patient Active Problem List   Diagnosis Date Noted   Pre-op evaluation 08/31/2021   Rash 01/27/2021   History of CVA (cerebrovascular accident) 01/05/2021   PBA (pseudobulbar  affect)    MS (multiple sclerosis) (HCC)    Anxiety and depression    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   Generalized anxiety disorder 12/11/2020   Acute focal neurological deficit 12/08/2020   Edema 09/28/2020   Bloating 08/25/2020   Environmental allergies 07/17/2019   Hyperglycemia 07/17/2019   Cough 06/06/2019   Memory change 05/29/2019   Abnormal liver function tests 01/09/2019   Hemorrhoid 09/27/2018   Nausea 08/11/2018   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   Depression with anxiety 02/04/2018   Cognitive and behavioral changes 02/04/2018   Constipation 11/17/2017   Hx of adenomatous colonic polyps 06/06/2016   Lower extremity edema 01/13/2016   Bilateral ovarian cysts 10/16/2015   Health care maintenance 10/16/2015   Colon polyp 01/25/2015   Bone/cartilage disorder 01/25/2015   Headache 11/18/2014   Depression, recurrent ([place]) 11/18/2014   Greater tuberosity of humerus fracture 11/18/2014   Multiple sclerosis ([place]) 11/18/2014   Unsteady gait 11/18/2014   Dizziness 11/18/2014   Inconclusive mammogram 03/15/2013   S/P lumbar spinal fusion 01/04/2013   Surgery, other elective 12/03/2012   DDD (degenerative disc disease), lumbosacral 10/15/2012   Back ache 09/06/2012   Hypertension 09/06/2012  Back pain 09/06/2012   Hypercholesterolemia 01/28/2012   Climacteric 01/28/2012   Routine general medical examination at a health care facility 01/28/2012   Avitaminosis D 01/28/2012    Particia Lather, PT 11/04/2021, 3:07 PM  [place] MAIN [facility] SERVICES [address] [place], [place], [address] Phone: [phone]   Fax:  [phone]  Name: SHENEIKA WALSTAD MRN: [id number] Date of Birth: 1951-02-08

## 2021-11-05 ENCOUNTER — Encounter: Payer: Medicare PPO | Admitting: Physical Medicine and Rehabilitation

## 2021-11-05 ENCOUNTER — Other Ambulatory Visit: Payer: Self-pay

## 2021-11-05 ENCOUNTER — Encounter: Payer: Self-pay | Admitting: Physical Medicine and Rehabilitation

## 2021-11-05 ENCOUNTER — Ambulatory Visit: Payer: Medicare Other

## 2021-11-05 VITALS — BP 125/76 | HR 90 | Ht 63.0 in | Wt 186.0 lb

## 2021-11-05 DIAGNOSIS — G4733 Obstructive sleep apnea (adult) (pediatric): Secondary | ICD-10-CM | POA: Diagnosis present

## 2021-11-05 DIAGNOSIS — F411 Generalized anxiety disorder: Secondary | ICD-10-CM | POA: Diagnosis present

## 2021-11-05 DIAGNOSIS — I890 Lymphedema, not elsewhere classified: Secondary | ICD-10-CM

## 2021-11-05 MED ORDER — FUROSEMIDE 40 MG PO TABS: 40.0000 mg | ORAL_TABLET | Freq: Every day | ORAL | 3 refills | Status: DC

## 2021-11-05 NOTE — Progress Notes (Addendum)
Subjective:    Patient ID: Carly [name], female    DOB: 1951/10/09, 71 y.o.   MRN: [id number]  HPI    Mrs. Azua is a 71 year old woman who was admitted to CIR with white matter periventricular CVA, presents for follow-up regarding anxiety, insomnia, OSA, and weight gain.    1) Anxiety:  -her psychiatrist previously transitioned her from Seroquel back to benzodiazepine due to side effect profile of Seroquel, but this worsened her anxiety so we restarted it -she has been having a lot of anxiety related to her and her husband's move from their current home into a retirement home and she asks whether she may take the seroquel additionally as needed- once in the morning, once later in the day, and two at night -she is interested in following with a behavioral therapist -she and husband [name] were scared of side effects of Seroquel that were discussed- she has not noted any this far.  -she has been sleeping but her legs feel very stiff at night -she continues to take Cymbalta 60mg  -we tried weaning her off the mirtazepine but she experienced weakness and insomnia and wanted to restart- titrated back up to her former dose.  -she discussed with her husband, who is also present on this call today, and she would prefer to restart Seroquel and stop Lorazepam.  -she has not had any counseling and would like to restart this. She has an appointment with [name] in August and would like to start counseling earlier than this if possible.  -she loved speaking with [name] inpatient and would like to follow with him -she had symptoms of dizziness yesterday at the hairdresser and was asked to contact me regarding if this could be from the seroquel. She had had no other similar episodes since starting it.  -she has been sleeping well with the Seroquel at night.  -she would like a list of foods that can help anxiety.  -she asks about the dietary advice I gave her last visit regarding  anxiety. -she has been using essential oil.  -the prednisone has caused weight gain.   2) MS: -has been having a lot of stiffness recently -EMS called twice due to difficulty getting her up off the floor -is receiving home therapy -she would like to see a different neurologist and asked for my recommendation   3) Lymphedema  -has significant bilateral lower extremity  -has greatly improved with lymphedema therapy -lasix 20mg  did not help enough.  -fluid has increased. -was unable to get compression garments.  -does not eat processed foods or salt.  -she has ordered something from [facility] to give her lymphatic massage.   4) HTN: SBP has been well controlled, but still with high sytolics of 850Y at times  5) Decreased appetite:  -weaning off of [name] has caused decreased appetite and decreased energy.   6) She developed pain in right side, and not sure if this is due to topamax or the antibiotics she is on to treat a respiratory infection.   7) Insomnia:  -sleeping poorly due to her CPAP mask -respiratory therapist will seeing her tomorrow.  -she asks about [employer].   8) Chest congestion -she has been having symptoms of congestion for greater than 3 months -Her father saw 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 She received a call to schedule an appointment with [name] office and is thankful for the referral.  -  she asks what else she can do to help with this -she has been through multiple rounds of antibiotics without benefit.  -also feeling diffuse joint aches.   9) GERD -has been experiencing after eating  10) Sinus congestion -pleased that her CT results were normal.   11) OSA -she notes that she was found to have OSA on recent sleep study and she was tearful about this as she feels it is just another condition she has -she was surprised as she feels that she sleeps well.  -she has note  been sleeping well while using the CPAP mask and was told that O2 is leaking -she is working with a respiratory therapist -she feels anxiety about whether she will get enough sleep tonight, she has only been sleeping about 4 hours since using the CPAP -she asks about [name] as she saw an ad for this on TV   Pain Inventory Average Pain 5 Pain Right Now 2 My pain is aching and no pain.  Sometimes spasm in legs at night because of MST  In the last 24 hours, has pain interfered with the following? General activity 4 Relation with others 7 Enjoyment of life 5  What TIME of day is your pain at its worst? night Sleep (in general) Fair  Pain is worse with: sitting and unsure Pain improves with: medication Relief from Meds: 5  Family History  Problem Relation Age of Onset   Arthritis Mother    Hypertension Mother    Macular degeneration Mother    Hypertension Father    Hyperlipidemia Father    Heart disease Maternal Grandfather    Diabetes Maternal Grandfather    Kidney disease Paternal Grandmother    Social History   Socioeconomic History   Marital status: Married    Spouse name: Engineer, technical sales   Number of children: 3   Years of education: 14   Highest education level: Associate degree: academic program  Occupational History   Not on file  Tobacco Use   Smoking status: Never   Smokeless tobacco: Never  Vaping Use   Vaping Use: Never used  Substance and Sexual Activity   Alcohol use: No    Alcohol/week: 0.0 standard drinks   Drug use: No   Sexual activity: Not Currently  Other Topics Concern   Not on file  Social History Narrative   Lives with husband    Right handed   Caffeine: 1 cup of coffee in the AM, coke occa. Trying to come off caffeine    Social Determinants of Health   Financial Resource Strain: Not on file  Food Insecurity: Not on file  Transportation Needs: Not on file  Physical Activity: Not on file  Stress: Not on file  Social Connections: Not on file    Past Surgical History:  Procedure Laterality Date   BACK SURGERY     CHOLECYSTECTOMY     COLONOSCOPY WITH PROPOFOL N/A 05/18/2017   Procedure: COLONOSCOPY WITH PROPOFOL;  Surgeon: [name], MD;  Location[facility] ENDOSCOPY;  Service: Endoscopy;  Laterality: N/A;   FOOT SURGERY  2015   GALLBLADDER SURGERY  2008   HARDWARE REMOVAL Left 02/14/2016   Procedure: LEFT FOOT REMOVAL DEEP IMPLANT;  Surgeon: [name], MD;  Location: [place];  Service: Orthopedics;  Laterality: Left;   HERNIA REPAIR     Inguinal Hernia Repair   SPINE SURGERY  2014   Past Surgical History:  Procedure Laterality Date   BACK SURGERY  CHOLECYSTECTOMY     COLONOSCOPY WITH PROPOFOL N/A 05/18/2017   Procedure: COLONOSCOPY WITH PROPOFOL;  Surgeon: [name], MD;  Location: [facility] ENDOSCOPY;  Service: Endoscopy;  Laterality: N/A;   FOOT SURGERY  2015   GALLBLADDER SURGERY  2008   HARDWARE REMOVAL Left 02/14/2016   Procedure: LEFT FOOT REMOVAL DEEP IMPLANT;  Surgeon: [name], MD;  Location: [place];  Service: Orthopedics;  Laterality: Left;   HERNIA REPAIR     Inguinal Hernia Repair   SPINE SURGERY  2014   Past Medical History:  Diagnosis Date   Allergy    Anxiety    Aspiration pneumonia (HCC)    Depression    Frequent headaches    H/O   GERD (gastroesophageal reflux disease)    History of chicken pox    History of colon polyps    Hx of migraines    Multiple sclerosis ([place]) 2011   OSA (obstructive sleep apnea)    PONV (postoperative nausea and vomiting)    BP 125/76    Pulse 90    Ht 5\' 3"  (1.6 m)    Wt 186 lb (84.4 kg)    SpO2 96%    BMI 32.95 kg/m   Opioid Risk Score:   Fall Risk Score:  `1  Depression screen PHQ 2/9  Depression screen [facility] 2/9 11/05/2021 08/27/2021 08/15/2021 07/19/2021 06/18/2021 05/31/2021 03/28/2021  Decreased Interest 0 2 0 1 3 3  0  Down, Depressed, Hopeless 1 2 0 1 3 3  0  PHQ - 2 Score 1 4 0 2 6 6  0  Altered sleeping - - - - - -  0  Tired, decreased energy - - - - - - 0  Change in appetite - - - - - - 0  Feeling bad or failure about yourself  - - - - - - 0  Trouble concentrating - - - - - - 0  Moving slowly or fidgety/restless - - - - - - 0  Suicidal thoughts - - - - - - 0  PHQ-9 Score - - - - - - 0  Difficult doing work/chores - - - - - - Not difficult at all  Some recent data might be hidden    Review of Systems  Constitutional: Negative.   HENT: Negative.    Eyes: Negative.   Respiratory:  Negative for cough.        Chest congestion  Cardiovascular: Negative.   Gastrointestinal: Negative.   Endocrine: Negative.   Genitourinary: Negative.   Musculoskeletal:  Positive for back pain and gait problem.  Skin: Negative.   Allergic/Immunologic: Negative.   Hematological:  Bruises/bleeds easily.       Plavix  Psychiatric/Behavioral:  Positive for dysphoric mood.   All other systems reviewed and are negative.     Objective:   Physical Exam Gen: no distress, normal appearing HEENT: oral mucosa pink and moist, NCAT Cardio: Reg rate Chest: normal effort, normal rate of breathing Abd: soft, non-distended Ext: bilateral lower extremity edema.  Psych: pleasant, normal affect Skin: intact Neuro: Alert and oriented     Assessment & Plan:  1) Anxiety: -discussed with patient the side effects of both Seroquel and Lorazepam, as well as potential interactions with other medications. -discussed her move because it is stressing her.  -continue Seroquel 12.5mg  3 times per day PRN -advised to always use the lowest dose of medications that she needs to minimize side effects -discussed the buspar that was started for her by  Dr. Kerman Passey- she has not yet noted benefits from this, advised that she does not need to take it if she does not feel benefits.  -referred to psychiatry in [facility] for CBT -reinforced importance of thinking positively.  -continue to encourage non-pharmacologic management approaches as well,  such as meditation, applying lavender oil, and exercise- all of which she has been trying and which have been helping.  -discussed alternative anxiety medications.  -continue seroquel 12.5mg  daily and 25mg  at night. Can decreased to 25mg  at night and assess positive/negative effects during the day. Can restart 1/2 tab during the day if anxiety is uncontrolled.  -discontinue lorazepam.  -establish care with [name] for neuropsych counseling on 8/2. They really valued the appointment with him.  -discontinue topamax in case pain in right side is due to medication- discussed can also be due to antibiotics which she is taking for respiratory infection- discontinuing the topamax will help [place] know if it is the cause of her pain or not. -continue [name] 30mg  HS.  -Discussed exercise and meditation as tools to decrease anxiety. -Recommended Down Dog Yoga app -Discussed spending time outdoors. -Discussed positive re-framing of anxiety.  -Discussed the following foods that have been show to reduce anxiety: 1) [place] nuts, mushrooms, soy beans due to their high selenium content. Upper limit of toxicity of selenium is 459mcg/day so no more than 3-4 [place] nuts per day.  2) Fatty fish such as salmon, mackerel, sardines, trout, and herring- high in omega-3 fatty acids 3) Eggs- increases serotonin and dopamine 4) Pumpkin seeds- high in omega-3 fatty acids 5) dark chocolate- high in flavanols that increase blood flow to brain 6) turmeric- take with black pepper to increase absorption 7) chamomile tea- antioxidant and anti-inflammatory properties 8) yogurt without sugar- supports gut-brain axis 9) green tea- contains L- theanine 10) blueberries- high in vitamin C and antioxidants 11) [place]- high in tryptophan which gets converted to serotonin 12) bell peppers- rich in vitamin C and antioxidants 13) citrus fruits- rich in vitamin C and antioxidants 14) almonds- high in vitamin E and healthy  fats 15) chia seeds- high in omega-3 fatty acids  2) MS -continue home therapy to minimize stiffness/maximize mobility -f/u with MS specialist. Discussed her appointmenr with Dr. Kerman Passey.  -provided with neurology referral  3) HTN: BP has been 130s-150s/80s, recommended citrus foods and nuts, as much mobility as she can tolerate, log Bps daily and bring log to f/u appointment.  -continue current regimen and checking and checking BP.  -advised citrus foods and nuts to help lower BP. Discussed that once BP is consistently 120/80 we can wean her Losartan to 25mg .   4) Bilateral lower extremity edema: - continue lymphedema therapy which is greatly helping.  -discussed response to Lasix. Increase Lasix to 40mg  daily.  -advised low salt diet.  -advised getting the compression garments as these can be very helpful.   5) Insomnia: -recommended tart cherry juice with dinner, valerian root and chamomile teas in evening, applying lavender drops to forehead at night. -continue [name] 30mg  HS- discussed that we will not try to wean off again since we saw how much this is benefittng her -discontinue amantadine.   6) Obesity BMI 33.13 -attempted to wean off [name] but she developed worsening insomnia, anxiety, and decreased appetite that was undesirable to her, so have restarted the medication.  -discussed trial of avoiding gluten for 3 months and how this could potentially help with her weight loss, anxiety, and prevention/slowing of her chronic diseases. -discussed  benefits of intermittent fasting- made goal to set a consistent dinner time and avoid snacking after this time. If she is hungry, choose a healthy snack of nuts, fruit, or vegetables.   7) Chest congestion -referred for pulmonary consult with [name], who treated her father for pulmonary fibrosis. She has made an appointment to establish care with him.  -recommended drinking daily warm water with honey, lime and  ginger -recommended drinking tea with holy Basil (Tulsi)  8) Sore throat: -continue salt water gargling  9) Sinus congestion: -recommended humidifier -drink 6-8 glasses of water per day.   10) fatigue -checked B12, folate, TSH, vitamin D, magnesium and discussed bloodwork with patient and husband: all in normal range except for suboptimal Vitamin D- high dose supplement prescribed -Recommended starting NAC (N-Acetyl cysteine) 600mg  BID for her fatigue. Discussed its benefits in boosting glutathione and mitochondrial function.   11) OSA -discussed that OSA can contribute to daytime fatigue, weight gain, anxiety, and chronic conditions so it is good to get this condition treated. -referred to Dr. Melida Quitter for eval for [facility] given that she is having a hard time sleeping with her CPAP. -advised her to talk to her respiratory therapist about fitting another mask -discussed [name] but she says unfortunately it is not covered by insurance.  -discussed strap under the chin.

## 2021-11-05 NOTE — Patient Instructions (Signed)
NAC 600mg BID 

## 2021-11-05 NOTE — Addendum Note (Signed)
Addended by: [name] on: 11/05/2021 10:46 AM   Modules accepted: Orders

## 2021-11-06 ENCOUNTER — Ambulatory Visit: Payer: Medicare PPO

## 2021-11-06 DIAGNOSIS — M6281 Muscle weakness (generalized): Secondary | ICD-10-CM

## 2021-11-06 DIAGNOSIS — R2689 Other abnormalities of gait and mobility: Secondary | ICD-10-CM

## 2021-11-06 DIAGNOSIS — R2681 Unsteadiness on feet: Secondary | ICD-10-CM

## 2021-11-06 DIAGNOSIS — R269 Unspecified abnormalities of gait and mobility: Secondary | ICD-10-CM | POA: Diagnosis not present

## 2021-11-06 DIAGNOSIS — R278 Other lack of coordination: Secondary | ICD-10-CM

## 2021-11-06 NOTE — Therapy (Signed)
[place] MAIN [facility] SERVICES [address] [place], [place], [address] Phone: [phone]   Fax:  [phone]  Physical Therapy Treatment  Patient Details  Name: Carly [name] MRN: [id number] Date of Birth: 02/21/1951 Referring Provider (PT): Einar Pheasant MD   Encounter Date: 11/06/2021   PT End of Session - 11/06/21 1212     Visit Number 17    Number of Visits 24    Date for PT Re-Evaluation 10/31/21    Authorization Time Period 08/08/21-10/31/20    Progress Note Due on Visit 20    PT Start Time 1022    PT Stop Time 1100    PT Time Calculation (min) 38 min    Equipment Utilized During Treatment Gait belt    Activity Tolerance Patient tolerated treatment well    Behavior During Therapy WFL for tasks assessed/performed             Past Medical History:  Diagnosis Date   Allergy    Anxiety    Aspiration pneumonia ([place])    Depression    Frequent headaches    H/O   GERD (gastroesophageal reflux disease)    History of chicken pox    History of colon polyps    Hx of migraines    Multiple sclerosis ([place]) 2011   OSA (obstructive sleep apnea)    PONV (postoperative nausea and vomiting)     Past Surgical History:  Procedure Laterality Date   BACK SURGERY     CHOLECYSTECTOMY     COLONOSCOPY WITH PROPOFOL N/A 05/18/2017   Procedure: COLONOSCOPY WITH PROPOFOL;  Surgeon: [name], MD;  Location: [facility] ENDOSCOPY;  Service: Endoscopy;  Laterality: N/A;   FOOT SURGERY  2015   GALLBLADDER SURGERY  2008   HARDWARE REMOVAL Left 02/14/2016   Procedure: LEFT FOOT REMOVAL DEEP IMPLANT;  Surgeon: [name], MD;  Location: [place];  Service: Orthopedics;  Laterality: Left;   HERNIA REPAIR     Inguinal Hernia Repair   SPINE SURGERY  2014    There were no vitals filed for this visit.   Subjective Assessment - 11/06/21 1023     Subjective Pt reports no other recent falls. Pt presents with caregiver and  new shoes.    Patient is accompained by: Family member    Pertinent History Pt reports she is in PT on order to allow for improvement with walking. Pt reports she has not been able to get [facility] PT because she is being seen by outpatient OT services. Pt reports she was walking  and her caregiver who helped her walk had a heart attack. Pt reports the last time she was walking regularly was about 2 years ago. She reports she gets up to [facility] with some assistance from her husband. Pt has walker, rollator and WC at home. Pt reports she used them often until her caregiver had the heart attack. Pt switched to new MS medication a couple years ago and attributes some of her dificits to the medicaiton change. Pt has significant difficulty with any activities involving standing. Pt lives in home wiht ramp to enter. House is one story with an upstairs but she lives primarilly on the first floor. Pt has shower which she can access. 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 She has weighted bracelets she has been using for arm strength. Pt reports she has not been to outpatient PT in the past but she has had HH in  the past. Pt reports swimming in past for exercise. She also reports she is moving to [place] [facility] in January.    Limitations Standing;Walking    How long can you stand comfortably? 15 min.    How long can you walk comfortably? 10 min.    Patient Stated Goals Standing, walking, transfers    Currently in Pain? No/denies    Pain Onset More than a month ago   leg pain and swelling first noticed during long care trip ~ 7 hours             Exercise/Activity Sets/Reps/Time/ Resistance Assistance Charge type Comments  Education/ self care       self care/home mgmt PT reinforces interchange of footwear to prevent falls/promote safety with mobility. PT instructed pt on using new footwear when resting to decrease swelling in B feet and benefits of specialized shoes. Pt's caregiver also present  for discussion. Both pt and caregiver verbalize understanding.   Chair dips 2x6, 1x4   CGA TherEx  Pt performs through decreased ROM, pt rates as challenging. Performed to promote strength necessary for technique with STS   STS  2x5,1x8, 1x10  CGA TherEx  Pt rates as medium. Cuing for technique. Pt must pull-up on bar to complete reps.    LAQ with 3# weights donned each LE 2x20 alternating    TherEx  Pt rates as medium   hamstring curls at matrix cable machine   1x12 BLEs at 2.5#  2x10-12 BLEs at 7.5#   TherEx  Pt rates 2.5# as easy and 7.5# as medium. PT cues pt for controlling eccentric component of intervention.        Pt required occasional rest breaks due fatigue throughout.    Pt educated throughout session about proper posture and technique with exercises. Improved exercise technique, movement at target joints, use of target muscles after min to mod verbal, visual, tactile cues.             PT Education - 11/06/21 1216     Education provided Yes    Education Details exercise technique, body mechanics    Person(s) Educated Patient    Methods Explanation;Demonstration;Verbal cues;Tactile cues    Comprehension Verbalized understanding;Returned demonstration;Verbal cues required;Need further instruction              PT Short Term Goals - 10/31/21 1409       PT SHORT TERM GOAL #1   Title Patient will be independent in home exercise program to improve strength/mobility for better functional independence with ADLs.    Baseline Pt does not have HEP; 09/25/2021- Patient reports compliant wth HEP consisting of LE strengthening.    Time 4    Period Weeks    Status Achieved    Target Date 09/05/21      PT SHORT TERM GOAL #2   Title Patient will increase FOTO score to equal to or greater than 4    to demonstrate statistically significant improvement in mobility and quality of life.    Baseline 28; 09/25/2021- will reassess next visit; 10/31/2021= 24    Time 6    Period  Weeks    Status On-going    Target Date 12/12/21               PT Long Term Goals - 10/31/21 1411       PT LONG TERM GOAL #1   Title Patient will be independent with advanced and progressive home program for strength and endurance in  order to transition to self management    Baseline Pt does not have current formal HEP; 09/25/2021- Patient does report compliance with initial HEP consisting on seated therex for LE strengthening. 10/31/2021= Patient is performing initial HEP with seated LE strengthening but will benefit from progressive strengthening including standing/walking/balance    Time 12    Period Weeks    Status On-going    Target Date 01/23/22      PT LONG TERM GOAL #2   Title pt's FOTO score will improve by 4 points or more indicating improved confidence with daily tasks at home    Baseline 28 at intake: 09/25/2021- Will assess next visit; 10/31/2021= 24    Time 12    Period Weeks    Status On-going    Target Date 01/23/22      PT LONG TERM GOAL #3   Title Patient will deny any falls over past 6 weeks to demonstrate improved safety awareness at home and work.    Baseline Pt reports 3 falls in previous 6 months; 09/25/2021= No falls reported and patient has been focusing on walking  some in clinic.    Time 6    Period Weeks    Status Achieved      PT LONG TERM GOAL #4   Title Patient will perform sit to and from stand from her wheelchair with min assist and no upper extremity support in order to indicate improvement in function.    Baseline Patient requires mod assist from author and bilateral lower extremity support to transition to and from sitting. 09/25/2021- Patient continues to require repetitive VC, TC, and min physical assistance to stand as wel as B UE Support.  10/31/2021= Patient continues to present with LE weakness- requires BUE support on armrest as well as min physical assist to stand.    Time 12    Period Weeks    Status On-going    Target Date 01/23/22       PT LONG TERM GOAL #5   Title Pt will increase 10MWT by at least 0.13 m/s in order to demonstrate clinically significant improvement in community ambulation.    Baseline 09/25/2021=10 MWT= 0.045 m/s using front wheeled walker. 10/31/2021= 0.056 m/s using front wheeled walker    Time 12    Period Weeks    Status On-going    Target Date 01/23/22      PT LONG TERM GOAL #6   Title Patient will ambulate on level surfaces using front wheeled walker with CGA > 200 feet without rest break or loss of balance for improved ability for household ambulation.    Baseline 09/25/2021= Patient was able to walk 32 feet today with front wheeled walker, Min assist with close w/c follow with decreased heel to toe and decreased step length. 10/31/2021- Patient able to walk approx 45 feet with front wheeled walker with minimal step length and dragging right LE at times    Time 12    Period Weeks    Status New    Target Date 01/23/22                   Plan - 11/06/21 1212     Clinical Impression Statement Session somewhat limited secondary to pt late arrival. However, pt still highly motivated to participate in PT. PT instructed pt on chair dips in order to develop strength necessary for improved STS/transfer technique. Pt also was able to progress to matrix cable machine hamstring curls, indicating improved LE strength.  The pt will benefit from further skilled PT to improve LE strength, ambulatory capacity, transfers, and balance to decrease fall risk and increase QOL.    Personal Factors and Comorbidities Age;Comorbidity 1;Comorbidity 2;Comorbidity 3+;Time since onset of injury/illness/exacerbation    Comorbidities Depression, anxiety, HTN, MS, DDD, GERD    Examination-Activity Limitations Bed Mobility;Carry;Continence;Dressing;Hygiene/Grooming;Lift;Locomotion Level;Squat;Stairs;Stand;Toileting;Transfers    Examination-Participation Restrictions Cleaning;Community Activity;Laundry;Meal Prep;Shop     Stability/Clinical Decision Making Evolving/Moderate complexity    Rehab Potential Fair    Clinical Impairments Affecting Rehab Potential (+) family support, motivated (-) MS, decreased confidence, co morbidities    PT Frequency 2x / week    PT Duration 12 weeks    PT Treatment/Interventions Patient/family education;Gait training;Neuromuscular re-education;Therapeutic exercise;Manual techniques;ADLs/Self Care Home Management;Stair training;DME Instruction;Functional mobility training;Therapeutic activities;Balance training;Wheelchair mobility training;Energy conservation;Joint Manipulations;Cryotherapy;Electrical Stimulation;Moist Heat;Passive range of motion    PT Next Visit Plan gait training, LE  strength training, endurance training    PT Home Exercise Plan Access Code: [email or web address]; no updates; 1/9: Access Code: [email or web address]; no updates    Consulted and Agree with Plan of Care Patient             Patient will benefit from skilled therapeutic intervention in order to improve the following deficits and impairments:  Decreased strength, Decreased balance, Decreased activity tolerance, Decreased endurance, Difficulty walking, Abnormal gait, Improper body mechanics, Decreased mobility, Hypomobility, Impaired flexibility, Impaired perceived functional ability, Decreased range of motion, Pain  Visit Diagnosis: Muscle weakness (generalized)  Unsteadiness on feet  Other abnormalities of gait and mobility  Other lack of coordination     Problem List Patient Active Problem List   Diagnosis Date Noted   Pre-op evaluation 08/31/2021   Rash 01/27/2021   History of CVA (cerebrovascular accident) 01/05/2021   PBA (pseudobulbar affect)    MS (multiple sclerosis) (HCC)    Anxiety and depression    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   Generalized anxiety disorder 12/11/2020   Acute focal neurological deficit 12/08/2020   Edema 09/28/2020   Bloating 08/25/2020    Environmental allergies 07/17/2019   Hyperglycemia 07/17/2019   Cough 06/06/2019   Memory change 05/29/2019   Abnormal liver function tests 01/09/2019   Hemorrhoid 09/27/2018   Nausea 08/11/2018   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   Depression with anxiety 02/04/2018   Cognitive and behavioral changes 02/04/2018   Constipation 11/17/2017   Hx of adenomatous colonic polyps 06/06/2016   Lower extremity edema 01/13/2016   Bilateral ovarian cysts 10/16/2015   Health care maintenance 10/16/2015   Colon polyp 01/25/2015   Bone/cartilage disorder 01/25/2015   Headache 11/18/2014   Depression, recurrent ([place]) 11/18/2014   Greater tuberosity of humerus fracture 11/18/2014   Multiple sclerosis ([place]) 11/18/2014   Unsteady gait 11/18/2014   Dizziness 11/18/2014   Inconclusive mammogram 03/15/2013   S/P lumbar spinal fusion 01/04/2013   Surgery, other elective 12/03/2012   DDD (degenerative disc disease), lumbosacral 10/15/2012   Back ache 09/06/2012   Hypertension 09/06/2012   Back pain 09/06/2012   Hypercholesterolemia 01/28/2012   Climacteric 01/28/2012   Routine general medical examination at a health care facility 01/28/2012   Avitaminosis D 01/28/2012    Zollie Pee, PT 11/06/2021, 12:17 PM  [place] MAIN [facility] SERVICES [address] [place], [place], [address] Phone: [phone]   Fax:  [phone]  Name: EVALISE ABRUZZESE MRN: [id number] Date of Birth: [birth year]

## 2021-11-08 ENCOUNTER — Ambulatory Visit: Payer: Medicare Other

## 2021-11-08 ENCOUNTER — Telehealth: Payer: Self-pay | Admitting: Internal Medicine

## 2021-11-08 DIAGNOSIS — G35 Multiple sclerosis: Secondary | ICD-10-CM

## 2021-11-08 NOTE — Telephone Encounter (Signed)
Pt called in stating that she wanted to talk to the cma regarding a wheelchair referral. Pt said they want come until the referral is sent in. Also pt is requesting a referral for occupational therapy for her hands

## 2021-11-12 ENCOUNTER — Ambulatory Visit: Payer: Medicare PPO

## 2021-11-12 ENCOUNTER — Other Ambulatory Visit: Payer: Self-pay

## 2021-11-12 DIAGNOSIS — R269 Unspecified abnormalities of gait and mobility: Secondary | ICD-10-CM | POA: Diagnosis not present

## 2021-11-12 DIAGNOSIS — R262 Difficulty in walking, not elsewhere classified: Secondary | ICD-10-CM

## 2021-11-12 DIAGNOSIS — R2681 Unsteadiness on feet: Secondary | ICD-10-CM

## 2021-11-12 DIAGNOSIS — M6281 Muscle weakness (generalized): Secondary | ICD-10-CM

## 2021-11-12 NOTE — Therapy (Signed)
[place] MAIN [facility] SERVICES [address] [place], [place], [address] Phone: [phone]   Fax:  [phone]  Physical Therapy Treatment  Patient Details  Name: Carly [name] MRN: [id number] Date of Birth: [birth year] Referring Provider (PT): Dr. Einar Pheasant for wheelchair eval for 11/12/2021   Encounter Date: 11/12/2021   PT End of Session - 11/12/21 1054     Visit Number 18    Number of Visits 24    Date for PT Re-Evaluation 01/23/22    Authorization Time Period 08/08/21-10/31/20; Recert 6/96/2952- 8/41/3244    Progress Note Due on Visit 20    PT Start Time 1045    PT Stop Time 1118    PT Time Calculation (min) 33 min    Equipment Utilized During Treatment Gait belt    Activity Tolerance Patient tolerated treatment well    Behavior During Therapy WFL for tasks assessed/performed             Past Medical History:  Diagnosis Date   Allergy    Anxiety    Aspiration pneumonia ([place])    Depression    Frequent headaches    H/O   GERD (gastroesophageal reflux disease)    History of chicken pox    History of colon polyps    Hx of migraines    Multiple sclerosis ([place]) 2011   OSA (obstructive sleep apnea)    PONV (postoperative nausea and vomiting)     Past Surgical History:  Procedure Laterality Date   BACK SURGERY     CHOLECYSTECTOMY     COLONOSCOPY WITH PROPOFOL N/A 05/18/2017   Procedure: COLONOSCOPY WITH PROPOFOL;  Surgeon: [name], MD;  Location: [facility] ENDOSCOPY;  Service: Endoscopy;  Laterality: N/A;   FOOT SURGERY  2015   GALLBLADDER SURGERY  2008   HARDWARE REMOVAL Left 02/14/2016   Procedure: LEFT FOOT REMOVAL DEEP IMPLANT;  Surgeon: [name], MD;  Location: [place];  Service: Orthopedics;  Laterality: Left;   HERNIA REPAIR     Inguinal Hernia Repair   SPINE SURGERY  2014    There were no vitals filed for this visit.   Subjective Assessment - 11/12/21 1052     Subjective  Patient reports tired from earlier w/c evaluation but states doing okay- Anxious about pending move to apartment at [facility]    Patient is accompained by: Family member    Pertinent History Pt reports she is in PT on order to allow for improvement with walking. Pt reports she has not been able to get [facility] PT because she is being seen by outpatient OT services. Pt reports she was walking  and her caregiver who helped her walk had a heart attack. Pt reports the last time she was walking regularly was about 2 years ago. She reports she gets up to [facility] with some assistance from her husband. Pt has walker, rollator and WC at home. Pt reports she used them often until her caregiver had the heart attack. Pt switched to new MS medication a couple years ago and attributes some of her dificits to the medicaiton change. Pt has significant difficulty with any activities involving standing. Pt lives in home wiht ramp to enter. House is one story with an upstairs but she lives primarilly on the first floor. Pt has shower which she can access. 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 She has weighted bracelets she has been using for arm strength. Pt reports she has  not been to outpatient PT in the past but she has had [place] in the past. Pt reports swimming in past for exercise. She also reports she is moving to [place] [facility] in January.    Limitations Standing;Walking    How long can you stand comfortably? 15 min.    How long can you walk comfortably? 10 min.    Patient Stated Goals Standing, walking, transfers    Currently in Pain? No/denies    Pain Onset More than a month ago   leg pain and swelling first noticed during long care trip ~ 7 hours              INTERVENTIONS:       Exercise/Activity Sets/Reps/Time/ Resistance Assistance Charge type Comments  Seated AROM hip march  2 sets of 10 reps  SBA- set up and VC  Therex Minimal height with left LE - was able to complete reps yet  decreased height on left that progressively worsened with fatigue.    Seated hip abd with RTB  X 12 reps   Set up TherEx  Pt performs through decreased ROM on left side.  Gait in gym/clinic  15 feet x 2 trials Min assist with use of gait belt and close w/c follow- Using RW Gait train  Patient was able to exhibit initial reciprocal steps yet quickly fatigued requiring min assist and increased UE Support- poor heel to toe sequencing and dragging left foot. Stopped both trials due to left LE weakness and overall fatigue.    LAQ with 3# weights donned each LE 2x10 alternating    TherEx  Pt unable to achieve full ROM with left and progressively decreased ability to achieve full ext with 2nd set- Fatigue as limiting factor  Hamstring curls RTB alternating LE's    2 sets of 12 reps   TherEx  Minimal ability to pull back on left LE vs. Right- Limited on 2nd set by fatigue.         Pt required brief rest breaks between exercises due fatigue throughout.     Education provided throughout session via VC/TC and demonstration to facilitate movement at target joints and correct muscle activation for all testing and exercises performed.                    PT Education - 11/12/21 1054     Education provided Yes    Education Details Exercise technique    Person(s) Educated Patient    Methods Explanation;Demonstration;Tactile cues;Verbal cues    Comprehension Verbalized understanding;Returned demonstration;Verbal cues required;Tactile cues required;Need further instruction              PT Short Term Goals - 11/12/21 1206       PT SHORT TERM GOAL #1   Title Patient will be independent in home exercise program to improve strength/mobility for better functional independence with ADLs.    Baseline Pt does not have HEP; 09/25/2021- Patient reports compliant wth HEP consisting of LE strengthening.    Time 4    Period Weeks    Status Achieved    Target Date 09/05/21      PT SHORT TERM  GOAL #2   Title Patient will increase FOTO score to equal to or greater than 4    to demonstrate statistically significant improvement in mobility and quality of life.    Baseline 28; 09/25/2021- will reassess next visit; 10/31/2021= 24    Time 6    Period Weeks  Status On-going    Target Date 12/12/21      PT SHORT TERM GOAL #3   Title Pt and caregivers will understand PT recommendation and appropriate/safe use for wheelchair and seating for home use.    Baseline 11/12/2021- Patient is currently limited to manual w/c and difficulty with transfers and dependent on caregiver for navigation.    Time 1    Period Days    Status Achieved    Target Date 11/12/21               PT Long Term Goals - 10/31/21 1411       PT LONG TERM GOAL #1   Title Patient will be independent with advanced and progressive home program for strength and endurance in order to transition to self management    Baseline Pt does not have current formal HEP; 09/25/2021- Patient does report compliance with initial HEP consisting on seated therex for LE strengthening. 10/31/2021= Patient is performing initial HEP with seated LE strengthening but will benefit from progressive strengthening including standing/walking/balance    Time 12    Period Weeks    Status On-going    Target Date 01/23/22      PT LONG TERM GOAL #2   Title pt's FOTO score will improve by 4 points or more indicating improved confidence with daily tasks at home    Baseline 28 at intake: 09/25/2021- Will assess next visit; 10/31/2021= 24    Time 12    Period Weeks    Status On-going    Target Date 01/23/22      PT LONG TERM GOAL #3   Title Patient will deny any falls over past 6 weeks to demonstrate improved safety awareness at home and work.    Baseline Pt reports 3 falls in previous 6 months; 09/25/2021= No falls reported and patient has been focusing on walking  some in clinic.    Time 6    Period Weeks    Status Achieved      PT LONG  TERM GOAL #4   Title Patient will perform sit to and from stand from her wheelchair with min assist and no upper extremity support in order to indicate improvement in function.    Baseline Patient requires mod assist from author and bilateral lower extremity support to transition to and from sitting. 09/25/2021- Patient continues to require repetitive VC, TC, and min physical assistance to stand as wel as B UE Support.  10/31/2021= Patient continues to present with LE weakness- requires BUE support on armrest as well as min physical assist to stand.    Time 12    Period Weeks    Status On-going    Target Date 01/23/22      PT LONG TERM GOAL #5   Title Pt will increase 10MWT by at least 0.13 m/s in order to demonstrate clinically significant improvement in community ambulation.    Baseline 09/25/2021=10 MWT= 0.045 m/s using front wheeled walker. 10/31/2021= 0.056 m/s using front wheeled walker    Time 12    Period Weeks    Status On-going    Target Date 01/23/22      PT LONG TERM GOAL #6   Title Patient will ambulate on level surfaces using front wheeled walker with CGA > 200 feet without rest break or loss of balance for improved ability for household ambulation.    Baseline 09/25/2021= Patient was able to walk 32 feet today with front wheeled walker, Min assist with close w/c  follow with decreased heel to toe and decreased step length. 10/31/2021- Patient able to walk approx 45 feet with front wheeled walker with minimal step length and dragging right LE at times    Time 12    Period Weeks    Status New    Target Date 01/23/22                   Plan - 11/12/21 1055     Clinical Impression Statement Patient presented as fatigued due to just completing her w/c evaluation just prior to session. She was emotional throughout the evaluation as she has declined the use of power wheelchair in past but realizes that this will be the best option for her due to the progression of her MS plus  comorbidities including CVA, Lymphedema. She was able to perform some LE strengthening today and able to minimal walk with use of walker today. She continues to exhibit fatigue as limiting factor during session. The pt will benefit from further skilled PT to improve LE strength, ambulatory capacity, transfers, and balance to decrease fall risk and increase QOL.    Personal Factors and Comorbidities Age;Comorbidity 1;Comorbidity 2;Comorbidity 3+;Time since onset of injury/illness/exacerbation    Comorbidities Depression, anxiety, HTN, MS, DDD, GERD    Examination-Activity Limitations Bed Mobility;Carry;Continence;Dressing;Hygiene/Grooming;Lift;Locomotion Level;Squat;Stairs;Stand;Toileting;Transfers    Examination-Participation Restrictions Cleaning;Community Activity;Laundry;Meal Prep;Shop    Stability/Clinical Decision Making Evolving/Moderate complexity    Rehab Potential Fair    Clinical Impairments Affecting Rehab Potential (+) family support, motivated (-) MS, decreased confidence, co morbidities    PT Frequency 2x / week    PT Duration 12 weeks    PT Treatment/Interventions Patient/family education;Gait training;Neuromuscular re-education;Therapeutic exercise;Manual techniques;ADLs/Self Care Home Management;Stair training;DME Instruction;Functional mobility training;Therapeutic activities;Balance training;Wheelchair mobility training;Energy conservation;Joint Manipulations;Cryotherapy;Electrical Stimulation;Moist Heat;Passive range of motion    PT Next Visit Plan gait training, LE  strength training, endurance training    PT Home Exercise Plan Access Code: [email or web address]; no updates; 1/9: Access Code: [email or web address]; no updates    Consulted and Agree with Plan of Care Patient             Patient will benefit from skilled therapeutic intervention in order to improve the following deficits and impairments:  Decreased strength, Decreased balance, Decreased activity tolerance, Decreased endurance,  Difficulty walking, Abnormal gait, Improper body mechanics, Decreased mobility, Hypomobility, Impaired flexibility, Impaired perceived functional ability, Decreased range of motion, Pain  Visit Diagnosis: Abnormality of gait and mobility  Difficulty in walking, not elsewhere classified  Muscle weakness (generalized)  Unsteadiness on feet     Problem List Patient Active Problem List   Diagnosis Date Noted   Pre-op evaluation 08/31/2021   Rash 01/27/2021   History of CVA (cerebrovascular accident) 01/05/2021   PBA (pseudobulbar affect)    MS (multiple sclerosis) ([place])    Anxiety and depression    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   Generalized anxiety disorder 12/11/2020   Acute focal neurological deficit 12/08/2020   Edema 09/28/2020   Bloating 08/25/2020   Environmental allergies 07/17/2019   Hyperglycemia 07/17/2019   Cough 06/06/2019   Memory change 05/29/2019   Abnormal liver function tests 01/09/2019   Hemorrhoid 09/27/2018   Nausea 08/11/2018   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   Depression with anxiety 02/04/2018   Cognitive and behavioral changes 02/04/2018   Constipation 11/17/2017   Hx of adenomatous colonic polyps 06/06/2016   Lower extremity edema 01/13/2016   Bilateral ovarian cysts 10/16/2015   Health care maintenance 10/16/2015   Colon polyp  01/25/2015   Bone/cartilage disorder 01/25/2015   Headache 11/18/2014   Depression, recurrent ([place]) 11/18/2014   Greater tuberosity of humerus fracture 11/18/2014   Multiple sclerosis ([place]) 11/18/2014   Unsteady gait 11/18/2014   Dizziness 11/18/2014   Inconclusive mammogram 03/15/2013   S/P lumbar spinal fusion 01/04/2013   Surgery, other elective 12/03/2012   DDD (degenerative disc disease), lumbosacral 10/15/2012   Back ache 09/06/2012   Hypertension 09/06/2012   Back pain 09/06/2012   Hypercholesterolemia 01/28/2012   Climacteric 01/28/2012   Routine general medical examination at  a health care facility 01/28/2012   Avitaminosis D 01/28/2012    Lewis Moccasin, PT 11/12/2021, 9:57 PM  [place] MAIN [facility] SERVICES [place], [place], [address] Phone: [phone]   Fax:  [phone]  Name: Carly [name] MRN: [id number] Date of Birth: Aug 15, 1951

## 2021-11-12 NOTE — Therapy (Addendum)
[place] MAIN [facility] SERVICES [address] [place], [place], [address] Phone: [phone]   Fax:  [phone]  Physical Therapy Evaluation- Wheelchair  Patient Details  Name: Carly [name] MRN: [id number] Date of Birth: 11-15-1950 Referring Provider (PT): Dr. Einar Pheasant for wheelchair eval for 11/12/2021   Encounter Date: 11/12/2021   PT End of Session - 11/12/21 1155     Visit Number 18   For wheelchair eval  - same day as treatment   Number of Visits 24    Date for PT Re-Evaluation 01/23/22    Authorization Time Period 08/08/21-10/31/20; Recert 4/94/4967- 5/91/6384    Progress Note Due on Visit 20    PT Start Time 0930    PT Stop Time 1042    PT Time Calculation (min) 72 min    Equipment Utilized During Treatment Gait belt    Activity Tolerance Patient tolerated treatment well    Behavior During Therapy WFL for tasks assessed/performed             Past Medical History:  Diagnosis Date   Allergy    Anxiety    Aspiration pneumonia ([place])    Depression    Frequent headaches    H/O   GERD (gastroesophageal reflux disease)    History of chicken pox    History of colon polyps    Hx of migraines    Multiple sclerosis ([place]) 2011   OSA (obstructive sleep apnea)    PONV (postoperative nausea and vomiting)     Past Surgical History:  Procedure Laterality Date   BACK SURGERY     CHOLECYSTECTOMY     COLONOSCOPY WITH PROPOFOL N/A 05/18/2017   Procedure: COLONOSCOPY WITH PROPOFOL;  Surgeon: [name], MD;  Location: [facility] ENDOSCOPY;  Service: Endoscopy;  Laterality: N/A;   FOOT SURGERY  2015   GALLBLADDER SURGERY  2008   HARDWARE REMOVAL Left 02/14/2016   Procedure: LEFT FOOT REMOVAL DEEP IMPLANT;  Surgeon: [name], MD;  Location: [facility];  Service: Orthopedics;  Laterality: Left;   HERNIA REPAIR     Inguinal Hernia Repair   SPINE SURGERY  2014    There were no vitals filed for this  visit.    Subjective Assessment - 11/12/21 1136     Subjective Patient presents today for a power wheelchair evaluation due to difficulty with all mobility and dependence on caregiver to nagvigate her manual wheelchair.    Patient is accompained by: Family member    Pertinent History Patient is a 71 year old female referral for power wheelchair evaluation from Dr. Einar Pheasant. 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  Patient reports her current manual w/c is insufficient for her needs- She states she is unable to manuever independently. She reports multiple falls (3) in past 6 months.    Limitations Standing;Walking    How long can you sit comfortably? no issues    How long can you stand comfortably? 15 min.    How long can you walk comfortably? 10 min.    Patient Stated Goals Standing, walking, transfers    Currently in Pain? No/denies    Pain Onset --   leg pain and swelling first noticed during long care trip ~ 7 hours          PATIENT INFORMATION: This Evaluation form will serve as the LMN for the following suppliers:  Supplier:  [employer] Contact Person: Deberah Pelton, ATP Phone: [phone]   Reason for Referral: Power Wheelchair evaluation Patient/caregiver Goals: "To obtain power wheelchair to improve my independence." Patient was seen for face-to-face evaluation for new power wheelchair.  Also present was Deberah Pelton, ATP with [employer] to discuss recommendations and wheelchair options.  Further paperwork was completed and sent to vendor.  Patient appears to qualify for power mobility device at this time per objective findings.   MEDICAL HISTORY:  Diagnosis: Primary Progressive Multiple Sclerosis. PMH: CVA 2022, Lympedema Primary Diagnosis Onset: 2012 [x]Progressive Disease Relevant Past and Future Surgeries: Height: 5'3" Weight: 178 Explain and recent changes or  trends in weight: swelling in BLE's can effect weight.   Relevant History including falls: 3 fall in past 6 month (2 in past 2 weeks)       HOME ENVIRONMENT: []House  []Condo/town home  [x]Apartment  []Assisted Living    []Lives Alone [x] Lives with Others                                                    Hours with caregiver: 8 hours per day  [x]Home is accessible to patient            Stairs  []Yes [] No     Ramp [x]Yes []No Comments:     COMMUNITY ADL: TRANSPORTATION: [x]Car    []Van    []Public Transportation    []Adapted w/c Lift   []Ambulance   []Other:       []Sits in wheelchair during transport  Employment/School:     Specific requirements pertaining to mobility                                                     Other:                                  Retired     [employer] PROCESSING SKILLS:  Handedness:   [x]Right     []Left    []NA  Comments:                                 Media planner for [employer] [x]Processing Skills are adequate for safe wheelchair operation  Areas of concern than may interfere with safe operation of wheelchair Description of problem   [] Attention to environment     []Judgment     [] Hearing  [] Vision or visual processing    []Motor Planning  [] Fluctuations in Behavior                                                None Observed or reported   VERBAL COMMUNICATION: [x]WFL receptive [x] WFL expressive [x]Understandable  []Difficult to understand  []non-communicative [] Uses an augmented communication device    CURRENT SEATING / MOBILITY: Current Mobility Base:   []None  []Dependent  [x]Manual  []Scooter  []Power  Type of Control:                       Manufacturer:    Probasic                     Size:     normal width adult                    Age:      [age]                     Current Condition of Mobility Base:    fair                                                                                                                  Current Wheelchair components:                                                                                                                                   Describe posture in present seating system:        Slouched with poor back and bottom support.                                                                     SENSATION and SKIN ISSUES: Sensation [x]Intact []Impaired []Absent   Level of sensation:                           Pressure Relief: Able to perform effective pressure relief :   []Yes  [x] No Method:                                                                              If not, Why?:     Due to patient weakness she requires physical assistance to transfer and unable to reposition  on her own.                                                                      Skin Issues/Skin Integrity Current Skin Issues   []Yes [x]No  [x]Intact [] Red area [] Open Area  []Scar Tissue [x]At risk from prolonged sitting  Where                              History of Skin Issues   []Yes [x]No  Where                                         When                                               Hx of skin flap surgeries []Yes [x]No  Where                  When                                                  Limited sitting tolerance []Yes [x]No Hours spent sitting in wheelchair daily:                                                         Complaint of Pain:  Please describe:  None at this time. She has complained in past of BLE pain associated with increased edema each leg intermittently.                                                                                                  Swelling/Edema:  Yes- known Lymphedema- currently receiving outpatient OT services for treatment and wearing compression stockings.  ADL STATUS (in reference to wheelchair use):  Indep Assist Unable Indep with Equip Not assessed Comments  Dressing                  x                                    Daily assist from caregiver                   Eating                x                                           Set up                                                                   Toileting                   x                                            Daily assist from caregiver                                                                    Bathing                 x                                             Daily assist from caregiver                                                                            Grooming/ Hygiene                x                                         Daily assist from caregiver  Meal Prep                          x                                  Daily assist from caregiver                                                                  IADLS                x                                             Daily assist from caregiver                                                         Bowel Management: []Continent  [x]Incontinent  []Accidents Comments:   Wears depends                                               Bladder Management: []Continent  [x]Incontinent  []Accidents Comments:     wears depends                                         WHEELCHAIR SKILLS: Manual w/c Propulsion: []UE or LE strength and endurance sufficient to participate in ADLs using manual wheelchair Arm :  []left []right  [x]Both                                   Foot:   []left []right  [x]Both  Distance: unable to self propel w/c  Operate Scooter: [] Strength, hand grip, balance and transfer appropriate for use []Living environment is accessible for use of scooter  Operate Power w/c:  [x] Std. Joystick   [] Alternative  Controls Indep [x] Assist [] Dependent/ Unable [] N/A [] [x]Safe          [] Functional      Distance:    100+feet today            Bed confined without wheelchair [] Yes [x] No However, patient presents at increased fall risk and increased risk of injury if she does not utilize a chair at this time.    STRENGTH/RANGE OF MOTION:  Range of Motion Strength  Shoulder          L= approx 140 deg flex R= approx 120 deg flex  3+/5 bilateral                                                    Elbow        WFL                                                3+/5   bilateral                                                     Wrist/Hand WFL                                                               3+/5                                                                         Hip <100 deg bilaterally                                                               2-/5 bilateral                                                             Knee      Left knee ext= lacking 28 deg from zero. Left knee flex= 85 deg R knee = WFL                                                           Right= 3+/5 knee flex/ext  Left= 2-/5 knee flex/ext                                                          Ankle Left dorsiflexion=lacking 5 deg from neutral; Right Dorsiflexion= WFL           L dorsiflex= 2-/5;  Right dorsiflex=3+/5                                                            MOBILITY/BALANCE:  [x] Patient is totally dependent for mobility                                                                                               Balance Transfers Ambulation  Sitting Balance: good Standing Balance: poor [] Independent [] Independent/Modified Independent  [x] [facility]     [] [facility] [] Supervision [] Supervision  [] Uses UE for balance  [] Supervision [] Min Assist [] Ambulates with Assist                           [] Min Assist [] Min assist [] Mod Assist [] Ambulates  with Device:  [] RW   [] StW   [] Kasandra Knudsen   []                [] Mod Assist [] Mod assist [x] Max assist   [] Max Assist [x] Max assist [] Dependent [] Indep. Short Distance Only  [] Unable [] Unable [] Lift / Sling Required Distance (in feet)                             [] Sliding board [x] Unable to Ambulate: (Explain: non- functional without PT support in clinic using RW or // bars and close w/c support  Cardio Status:  [x]Intact  [] Impaired   [] NA                              Respiratory Status:  [x]Intact   []Impaired   []NA                                     Orthotics/Prosthetics:                                                                         Comments (Address manual vs power w/c vs scooter):    Patient is a 71 year old female referral for power wheelchair evaluation from Dr. Einar Pheasant. 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  Patient reports her current manual w/c is insufficient for her needs- She states she is unable to manuever independently. She reports multiple falls (3)  in past 6 months. 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 She present with extremely limited ability to walk with use of // bars or RW for short distances < 50 feet with increased risk of falling. At this time- patient demonstrated inability to maneuver a manual w/c displaying insufficient upper and lower extremity strength to propel a manual w/c and currently dependent on caregiver to negotiate her manual wheelchair.  Due to her impairments and progressive natural of condition she would not be appropriate for a manual wheelchair or scooter at this time.                                                       Anterior / Posterior  Obliquity Rotation-Pelvis  PELVIS    [x]Neutral  [] Posterior  [] Anterior     []WFL  []Right Elevated  [x]Left Elevated   [x]WFL  []Right Anterior []  Left Anterior    [] Fixed [] Partly Flexible [x] Flexible  [] Other  [] Fixed  [] Partly Flexible  [x] Flexible [] Other  [] Fixed  [] Partly Flexible  [x] Flexible [] Other  TRUNK []WFL [x]Thoracic Kyphosis []Lumbar Lordosis   [x] WFL []Convex Right []Convex Left   []c-curve []s-curve []multiple  [x] Neutral [] Left-anterior [] Right-anterior    [] Fixed [x] Flexible [] Partly Flexible       Other  [] Fixed [x] Flexible [] Partly Flexible [] Other  [] Fixed           [x] Flexible [] Partly Flexible [] Other   Position Windswept   HIPS  [] Neutral [x] Abduct slight R/L [] ADduct [x] Neutral [] Right [] Left       [] Fixed  [x] Partly Flexible             [] Dislocated [] Flexible [] Subluxed    [] Fixed [] Partly Flexible  [x] Flexible [] Other              Foot Positioning Knee Positioning   Knees and  Feet  [x] WFL []Left []Right [] WFL [x]Left []Right   KNEES ROM concerns: ROM concerns: Limited ability to flex her knee independently   & Dorsi-Flexed                    []Lt []Rt                                  FEET Plantar Flexed                  []Lt []Rt     Inversion                    []Lt []Rt     Eversion                    []Lt []Rt    HEAD [x] Functional [] Good Head Control   & [] Flexed         [] Extended [x] Adequate Head Control   NECK []  Rotated  Lt  [] Lat Flexed Lt [] Rotated  Rt [] Lat Flexed Rt [] Limited Head Control    [] Cervical Hyperextension [] Absent  Head Control    SHOULDERS ELBOWS WRIST& HAND         Left     Right    Left     Right  U/E [x]Functional  Left            [x]Functional  Right                                 []Fisting             []Fisting     []elevated Left []depressed  Left []elevated Right []depressed  Right       []protracted Left []retracted Left []protracted Right []retracted Right []subluxed  Left              []subluxed  Right         Goals for Wheelchair Mobility  [x] Independence with mobility in the home with motor related ADLs (MRADLs)  [x] Independence with MRADLs in the community [] Provide dependent mobility  [x] Provide recline     [x]Provide tilt   Goals for Seating system [x] Optimize pressure distribution [x] Provide support needed to facilitate function or safety [x] Provide corrective forces to assist with maintaining or improving posture [x] Accommodate clients posture: current seated postures and positions are not flexible or will not tolerate corrective forces [x] Client to be independent with relieving pressure in the wheelchair [x]Enhance physiological function such as breathing, swallowing, digestion  Simulation ideas/Equipment trials:     Permobil M3                                                                                           State why other equipment was unsuccessful:     Patient is a 71 year old female referral for power wheelchair evaluation from Dr. Einar Pheasant. 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  Patient reports her current manual w/c is insufficient for her needs- She states she is unable to manuever independently. She reports multiple falls (3) in past 6 months. 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 She present with extremely limited ability to walk with use of // bars or RW for short distances < 50 feet with increased risk of falling. At this time- patient demonstrated inability to maneuver a manual w/c displaying insufficient upper and lower extremity strength to propel a manual w/c  and currently dependent on caregiver to negotiate her manual wheelchair.  Due to her impairments and progressive natural of condition she would not be appropriate for a manual wheelchair or scooter at this time. She will benefit from a power based wheelchair as she is not ambulating functionally at this time. And due  to her bilateral upper and lower extremity weakness, she is unable to safely propel a manual wheelchair or operate a scooter. She would rather benefit from a power based model wheelchair that includes tilt and recline options to assist with safe transfers using forward tilt to help stand and backward tilt to to assist with positioning back in chair more independently. These options both greatly reduce risk of falling with transfers. The power based tilt and recline options will also assist in in changing positions while in chair for independent pressure distribution. These options with also assist the patient and caregiver in ADL's including changing clothes and diaper using the recline option. She will also benefit from other options including a back angle adjustable function to provide optional postural control and pressure relief for decreased risk of skin breakdown and improved overall comfort. She will require a seat cushion to decrease risk of pressure ulcers. She will need a lateral thigh support to ensure her legs are positioned in neutral and protect legs while in reclined/elevated position. Patient with need a head support or positioning in reclined position for optimal support. She will need flip back armrests for elbow support and ability to safely transfer with decreased fall risk. She will need adjustable foot plate for appropriate foot support in sitting allowing for adequate ankle ROM to decrease any tone. In conclusion, Patient presents today with poor mobiltiy- insufficient ability to walk or propel a manual w/c. She has a progressive condition and at this time is most appropriate for  a power based model wheelchair that has power options to assist with co-morbidities including weakness from MS and CVA and swelling from Lymphedema. She would benefit most from a Permobil model M3 power wheelchair for optimal functional mobility, improved ADL function, improved quality of life, and decreased risk of falling. community                                                                        MOBILITY BASE RECOMMENDATIONS and JUSTIFICATION: MOBILITY COMPONENT JUSTIFICATION  Manufacturer:    Permobil       Model:      M3        Size: Width  19 in    Seat Depth   19 in         [x]provide transport from point A to B [x]promote Indep mobility  [x]is not a safe, functional ambulator [x]walker or cane inadequate []non-standard width/depth necessary to accommodate anatomical measurement []                            []Manual Mobility Base []non-functional ambulator    []Scooter/POV  []can safely operate  []can safely transfer   []has adequate trunk stability  []cannot functionally propel manual w/c  [x]Power Mobility Base  [x]non-ambulatory  [x]cannot functionally propel manual wheelchair  [x] cannot functionally and safely operate scooter/POV [x]can safely operate and willing to  []Stroller Base []infant/child  []unable to propel manual wheelchair []allows for growth []non-functional ambulator []non-functional UE []Indep mobility is not a goal at this time  [x]Tilt  [x]Forward                   [x]Backward                  [  x]Powered tilt              []Manual tilt  [x]change position against gravitational force on head and shoulders  [x]change position for pressure relief/cannot weight shift [x]transfers  [x]management of tone [x]rest periods [x]control edema [x]facilitate postural control  []                                      [x]Recline  [x]Power recline on power base []Manual recline on manual base  [x]accommodate femur to back angle  [x]bring to full recline  for ADL care  [x]change position for pressure relief/cannot weight shift [x]rest periods [x]repositioning for transfers or clothing/diaper /catheter changes [x]head positioning  []Lighter weight required []self- propulsion  []lifting []                                                []Heavy Duty required []user weight greater than 250# []extreme tone/ over active movement []broken frame on previous chair []                                    [x] Back  [x] Angle Adjustable [] Custom molded                           [x]postural control [x]control of tone/spasticity [x]accommodation of range of motion [x]UE functional control []accommodation for seating system []                                         []provide lateral trunk support []accommodate deformity [x]provide posterior trunk support [x]provide lumbar/sacral support [x]support trunk in midline [x]Pressure relief over spinal processes  [x] Seat Cushion                       []impaired sensation  []decubitus ulcers present []history of pressure ulceration [x]prevent pelvic extension []low maintenance  [x]stabilize pelvis  []accommodate obliquity []accommodate multiple deformity [x]neutralize lower extremity position [x]increase pressure distribution []                                          [x] Pelvic/thigh support  [x] Lateral thigh guide [] Distal medial pad  [] Distal lateral pad [] pelvis in neutral [x]accommodate pelvis [] position upper legs [x] alignment [] accommodate ROM [] decrease adduction []accommodate tone [x]removable for transfers [x]decrease abduction  [] Lateral trunk Supports [] Lt     [] Rt []decrease lateral trunk leaning []control tone []contour for increased contact []safety  []accommodate asymmetry []                                                [x] Mounting hardware  [x]lateral trunk supports  []back   []seat [x]headrest      [x] thigh support []fixed   []swing away []attach  seat platform/cushion to w/c frame []attach back cushion to w/c frame [x]mount postural supports [x]mount headrest  []swing medial thigh support away [x]swing lateral supports away for transfers  [x]   Support patient while in reclined position to decrease risk of falling out.                                                 Armrests  []fixed [x]adjustable height []removable   []swing away  [x]flip back   []reclining [x]full length pads []desk    []pads tubular  [x]provide support with elbow at 90   []provide support for w/c tray [x]change of height/angles for variable activities [x]remove for transfers [x]allow to come closer to table top []remove for access to tables []                                              Hangers/ Leg rests  []60 []70 []90 []elevating []heavy duty  [x]articulating []fixed []lift off []swing away     [x]power [x]provide LE support  []accommodate to hamstring tightness [x]elevate legs during recline   [x]provide change in position for Legs [x]Maintain placement of feet on footplate [x]durability []enable transfers [x]decrease edema [x]Accommodate lower leg length []                                        Foot support Footplate    []Lt  [] Rt  [x] Center mount [x]flip up                            [x]depth/angle adjustable []Amputee adapter    [] Lt     [] Rt [x]provide foot support [x]accommodate to ankle ROM [x]transfers []Provide support for residual extremity [] allow foot to go under wheelchair base [] decrease tone  []                                                [] Ankle strap/heel loops []support foot on foot support []decrease extraneous movement []provide input to heel  []protect foot  Tires: []pneumatic  [x]flat free inserts  []solid  [x]decrease maintenance  [x]prevent frequent flats []increase shock absorbency []decrease pain from road shock []decrease spasms from road shock []                                             [x]  Headrest  [x]provide posterior head support [x]provide posterior neck support []provide lateral head support []provide anterior head support []support during tilt and recline []improve feeding   []improve respiration []placement of switches [x]safety  []accommodate ROM  []accommodate tone []improve visual orientation  [x] Anterior chest strap [] Vest [] Shoulder retractors  []decrease forward movement of shoulder []accommodation of TLSO [x]decrease forward movement of trunk []decrease shoulder elevation []added abdominal support [x]alignment []assistance with shoulder control  []  Pelvic Positioner [x]Belt []SubASIS bar []Dual Pull []stabilize tone [x]decrease falling out of chair/ **will not Decrease potential for sliding due to pelvic tilting []prevent excessive rotation []pad for protection over boney prominence []prominence comfort []special pull angle to control rotation []                                                 Upper ExtremitySupport  []L   [] R []Arm trough   []hand support [] tray       [x]full tray []swivel mount []decrease edema      []decrease subluxation   []control tone   []placement for AAC/Computer/EADL [x]decrease gravitational pull on shoulders [x]provide midline positioning [x]provide support to increase UE function []provide hand support in natural position [x]provide work surface   POWER WHEELCHAIR CONTROLS  [x]Proportional  []Non-Proportional Type                                      []Left  [x]Right [x]provides access for controlling wheelchair   []lacks motor control to operate proportional drive control []unable to understand proportional controls  Actuator Control Module  []Single  [x]Multiple   [x]Allow the client to operate the power seat function(s) through the joystick control   []Safety Reset Switches []Used to change modes and stop the wheelchair when driving in latch mode     [x]Upgraded Electronics   []programming for accurate control [x]progressive Disease/changing condition []non-proportional drive control needed [x]Needed in order to operate power seat functions through joystick control   []Display box []Allows user to see in which mode and drive the wheelchair is set  []necessary for alternate controls    []Digital interface electronics []Allows w/c to operate when using alternative drive controls  []ASL Head Array []Allows client to operate wheelchair  through switches placed in tri-panel headrest  []Sip and puff with tubing kit []needed to operate sip and puff drive controls  []Upgraded tracking electronics []increase safety when driving []correct tracking when on uneven surfaces  [x]Mount for switches or joystick []Attaches switches to w/c  [x]Swing away for access or transfers -To improve patient ability to navigate with limited dexterity of fingers and optimize independent mobility []midline for optimal placement [x]provides for consistent access  []Attendant controlled joystick plus mount []safety []long distance driving []operation of seat functions []compliance with transportation regulations []                                            Rear wheel placement/Axle adjustability []None []semi adjustable []fully adjustable  []improved UE access to wheels []improved stability []changing angle in space for improvement of postural stability []1-arm drive access []amputee pad placement []                               Wheel rims/ hand rims  []metal   []plastic coated []oblique projections           []vertical projections []Provide ability to propel manual wheelchair  [] Increase self-propulsion with hand weakness/decreased grasp  Push handles []extended   []angle adjustable              []  standard []caregiver access []caregiver assist []allows hooking to enable increased ability to perform ADLs or maintain balance  One armed device   []Lt    []Rt []enable propulsion of manual wheelchair with one arm   []                                           Brake/wheel lock extension [] Lt   [] Rt []increase indep in applying wheel locks   []Side guards []prevent clothing getting caught in wheel or becoming soiled [] prevent skin tears/abrasions  Battery:  GRP 24x2                                          [x]to power wheelchair                                                         Other:    Power adjustable Seat Height- 12" travel                                        -   To improve patient functional independence with ADL's and to assist with independence with transfers.                                                                          The above equipment has a life- long use expectancy. Growth and changes in medical and/or functional conditions would be the exceptions. This is to certify that the therapist has no financial relationship with durable medical provider or manufacturer. The therapist will not receive remuneration of any kind for the equipment recommended in this evaluation.   Patient has mobility limitation that significantly impairs safe, timely participation in one or more mobility related ADLs. (bathing, toileting, feeding, dressing, grooming, moving from room to room)  [x] Yes [] No  Will mobility device sufficiently improve ability to participate and/or be aided in participation of MRADLs?      [x] Yes [] No  Can limitation be compensated for with use of a cane or walker?                                    [] Yes [x] No  Does patient or caregiver demonstrate ability/potential ability & willingness to safely use the mobility device?    [x] Yes [] No  Does patients home environment support use of recommended mobility device?            [x] Yes [] No  Does patient have sufficient upper extremity function necessary to functionally propel a manual wheelchair?     [] Yes [x] No  Does patient have sufficient strength  and trunk stability to safely operate a POV (scooter)?                                  [] Yes [x] No  Does patient need additional features/benefits provided by a power wheelchair for MRADLs in the home?        [x] Yes [] No  Does the patient demonstrate the ability to safely use a power wheelchair?                   [x] Yes [] No     Physicians Name Printed:       Dr. Einar Pheasant                                                 Physicians Signature:  Date:     This is to certify that I, the above signed therapist have the following affiliations: [] This DME provider [] Manufacturer of recommended equipment [] Patients long term care facility [x] None of the above  Therapist Name/Signature:     Ollen Bowl, PT                                       Date: 11/12/2021       [facility] PT Assessment - 11/12/21 1146       Assessment   Medical Diagnosis Progressive Primary Multiple Sclerosis    Referring Provider (PT) Dr. Einar Pheasant for wheelchair eval for 11/12/2021    Hand Dominance Right    Prior Therapy yes      Precautions   Precautions Fall      Restrictions   Weight Bearing Restrictions No      Balance Screen   Has the patient fallen in the past 6 months Yes    How many times? 3    Has the patient had a decrease in activity level because of a fear of falling?  Yes    Is the patient reluctant to leave their home because of a fear of falling?  Yes      [place] Private residence    Living Arrangements Spouse/significant other    Available Help at Discharge Family;Personal care attendant    Type of [place] Access Level entry;Elevator    Entrance Stairs-Number of Steps elevator; level entrance to apartment      Prior Function   Level of Independence Needs assistance with ADLs;Needs assistance with homemaking;Needs assistance with transfers                        Objective measurements completed  on examination: See above findings.                PT Education - 11/12/21 1150     Education provided Yes    Education Details Purpose of wheelchair evaluation    Person(s) Educated Patient;Caregiver(s)    Methods Explanation;Demonstration;Verbal cues;Tactile cues    Comprehension Verbalized understanding;Returned demonstration              PT Short Term Goals - 11/12/21 1206       PT SHORT TERM GOAL #  1   Title Patient will be independent in home exercise program to improve strength/mobility for better functional independence with ADLs.    Baseline Pt does not have HEP; 09/25/2021- Patient reports compliant wth HEP consisting of LE strengthening.    Time 4    Period Weeks    Status Achieved    Target Date 09/05/21      PT SHORT TERM GOAL #2   Title Patient will increase FOTO score to equal to or greater than 4    to demonstrate statistically significant improvement in mobility and quality of life.    Baseline 28; 09/25/2021- will reassess next visit; 10/31/2021= 24    Time 6    Period Weeks    Status On-going    Target Date 12/12/21      PT SHORT TERM GOAL #3   Title Pt and caregivers will understand PT recommendation and appropriate/safe use for wheelchair and seating for home use.    Baseline 11/12/2021- Patient is currently limited to manual w/c and difficulty with transfers and dependent on caregiver for navigation.    Time 1    Period Days    Status Achieved    Target Date 11/12/21               PT Long Term Goals - 10/31/21 1411       PT LONG TERM GOAL #1   Title Patient will be independent with advanced and progressive home program for strength and endurance in order to transition to self management    Baseline Pt does not have current formal HEP; 09/25/2021- Patient does report compliance with initial HEP consisting on seated therex for LE strengthening. 10/31/2021= Patient is performing initial HEP with seated LE strengthening but will benefit  from progressive strengthening including standing/walking/balance    Time 12    Period Weeks    Status On-going    Target Date 01/23/22      PT LONG TERM GOAL #2   Title pt's FOTO score will improve by 4 points or more indicating improved confidence with daily tasks at home    Baseline 28 at intake: 09/25/2021- Will assess next visit; 10/31/2021= 24    Time 12    Period Weeks    Status On-going    Target Date 01/23/22      PT LONG TERM GOAL #3   Title Patient will deny any falls over past 6 weeks to demonstrate improved safety awareness at home and work.    Baseline Pt reports 3 falls in previous 6 months; 09/25/2021= No falls reported and patient has been focusing on walking  some in clinic.    Time 6    Period Weeks    Status Achieved      PT LONG TERM GOAL #4   Title Patient will perform sit to and from stand from her wheelchair with min assist and no upper extremity support in order to indicate improvement in function.    Baseline Patient requires mod assist from author and bilateral lower extremity support to transition to and from sitting. 09/25/2021- Patient continues to require repetitive VC, TC, and min physical assistance to stand as wel as B UE Support.  10/31/2021= Patient continues to present with LE weakness- requires BUE support on armrest as well as min physical assist to stand.    Time 12    Period Weeks    Status On-going    Target Date 01/23/22      PT LONG TERM GOAL #5  Title Pt will increase 10MWT by at least 0.13 m/s in order to demonstrate clinically significant improvement in community ambulation.    Baseline 09/25/2021=10 MWT= 0.045 m/s using front wheeled walker. 10/31/2021= 0.056 m/s using front wheeled walker    Time 12    Period Weeks    Status On-going    Target Date 01/23/22      PT LONG TERM GOAL #6   Title Patient will ambulate on level surfaces using front wheeled walker with CGA > 200 feet without rest break or loss of balance for improved  ability for household ambulation.    Baseline 09/25/2021= Patient was able to walk 32 feet today with front wheeled walker, Min assist with close w/c follow with decreased heel to toe and decreased step length. 10/31/2021- Patient able to walk approx 45 feet with front wheeled walker with minimal step length and dragging right LE at times    Time 12    Period Weeks    Status New    Target Date 01/23/22                    Plan - 11/12/21 1201     Clinical Impression Statement See Therapy note section as plan exceeds allowed space.    Personal Factors and Comorbidities Age;Comorbidity 1;Comorbidity 2;Comorbidity 3+;Time since onset of injury/illness/exacerbation    Comorbidities Depression, anxiety, HTN, MS, DDD, GERD, Lymphedema    Examination-Activity Limitations Bed Mobility;Carry;Continence;Dressing;Hygiene/Grooming;Lift;Locomotion Level;Squat;Stairs;Stand;Toileting;Transfers    Examination-Participation Restrictions Cleaning;Community Activity;Laundry;Meal Prep;Shop    Stability/Clinical Decision Making Evolving/Moderate complexity    Rehab Potential Fair    Clinical Impairments Affecting Rehab Potential (+) family support, motivated (-) MS, decreased confidence, co morbidities    PT Frequency 2x / week    PT Duration 12 weeks    PT Treatment/Interventions Patient/family education;Gait training;Neuromuscular re-education;Therapeutic exercise;Manual techniques;ADLs/Self Care Home Management;Stair training;DME Instruction;Functional mobility training;Therapeutic activities;Balance training;Wheelchair mobility training;Energy conservation;Joint Manipulations;Cryotherapy;Electrical Stimulation;Moist Heat;Passive range of motion    PT Next Visit Plan gait training, LE  strength training, endurance training    PT Home Exercise Plan Access Code: [email or web address]; no updates; 1/9: Access Code: [email or web address]; no updates    Consulted and Agree with Plan of Care Patient             Patient will  benefit from skilled therapeutic intervention in order to improve the following deficits and impairments:  Decreased strength, Decreased balance, Decreased activity tolerance, Decreased endurance, Difficulty walking, Abnormal gait, Improper body mechanics, Decreased mobility, Hypomobility, Impaired flexibility, Impaired perceived functional ability, Decreased range of motion, Pain  Visit Diagnosis: Abnormality of gait and mobility  Difficulty in walking, not elsewhere classified  Muscle weakness (generalized)  Unsteadiness on feet     Problem List Patient Active Problem List   Diagnosis Date Noted   Pre-op evaluation 08/31/2021   Rash 01/27/2021   History of CVA (cerebrovascular accident) 01/05/2021   PBA (pseudobulbar affect)    MS (multiple sclerosis) ([place])    Anxiety and depression    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   Generalized anxiety disorder 12/11/2020   Acute focal neurological deficit 12/08/2020   Edema 09/28/2020   Bloating 08/25/2020   Environmental allergies 07/17/2019   Hyperglycemia 07/17/2019   Cough 06/06/2019   Memory change 05/29/2019   Abnormal liver function tests 01/09/2019   Hemorrhoid 09/27/2018   Nausea 08/11/2018   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   Depression with anxiety 02/04/2018   Cognitive and behavioral changes 02/04/2018   Constipation 11/17/2017   Hx of  adenomatous colonic polyps 06/06/2016   Lower extremity edema 01/13/2016   Bilateral ovarian cysts 10/16/2015   Health care maintenance 10/16/2015   Colon polyp 01/25/2015   Bone/cartilage disorder 01/25/2015   Headache 11/18/2014   Depression, recurrent ([place]) 11/18/2014   Greater tuberosity of humerus fracture 11/18/2014   Multiple sclerosis ([place]) 11/18/2014   Unsteady gait 11/18/2014   Dizziness 11/18/2014   Inconclusive mammogram 03/15/2013   S/P lumbar spinal fusion 01/04/2013   Surgery, other elective 12/03/2012   DDD (degenerative disc disease),  lumbosacral 10/15/2012   Back ache 09/06/2012   Hypertension 09/06/2012   Back pain 09/06/2012   Hypercholesterolemia 01/28/2012   Climacteric 01/28/2012   Routine general medical examination at a health care facility 01/28/2012   Avitaminosis D 01/28/2012    Lewis Moccasin, PT 11/12/2021, 9:49 PM  [place] [facility] SERVICES [place], [place], [address] Phone: [phone]   Fax:  [phone]  Name: MAILIN COGLIANESE MRN: [id number] Date of Birth: Aug 30, 1951

## 2021-11-13 ENCOUNTER — Encounter: Payer: Self-pay | Admitting: Hematology and Oncology

## 2021-11-13 LAB — BASIC METABOLIC PANEL
BUN/Creatinine Ratio: 18 (ref 12–28)
BUN: 12 mg/dL (ref 8–27)
CO2: 25 mmol/L (ref 20–29)
Calcium: 10 mg/dL (ref 8.7–10.3)
Chloride: 99 mmol/L (ref 96–106)
Creatinine, Ser: 0.68 mg/dL (ref 0.57–1.00)
Glucose: 73 mg/dL (ref 70–99)
Potassium: 4.3 mmol/L (ref 3.5–5.2)
Sodium: 141 mmol/L (ref 134–144)
eGFR: 93 mL/min/{1.73_m2} (ref >59)

## 2021-11-13 NOTE — Telephone Encounter (Signed)
[facility] fax DME order. Called [facility] they did evaluate patient for wheelchair will callback with the order needed for DME to be faxed.

## 2021-11-14 ENCOUNTER — Other Ambulatory Visit: Payer: Self-pay

## 2021-11-14 ENCOUNTER — Encounter
Payer: Medicare Other | Attending: Physical Medicine and Rehabilitation | Admitting: Physical Medicine and Rehabilitation

## 2021-11-14 ENCOUNTER — Ambulatory Visit: Payer: Medicare Other

## 2021-11-14 DIAGNOSIS — G4733 Obstructive sleep apnea (adult) (pediatric): Secondary | ICD-10-CM | POA: Insufficient documentation

## 2021-11-14 DIAGNOSIS — I890 Lymphedema, not elsewhere classified: Secondary | ICD-10-CM | POA: Diagnosis not present

## 2021-11-14 DIAGNOSIS — Z8673 Personal history of transient ischemic attack (TIA), and cerebral infarction without residual deficits: Secondary | ICD-10-CM | POA: Insufficient documentation

## 2021-11-14 DIAGNOSIS — F482 Pseudobulbar affect: Secondary | ICD-10-CM | POA: Insufficient documentation

## 2021-11-14 DIAGNOSIS — F411 Generalized anxiety disorder: Secondary | ICD-10-CM | POA: Insufficient documentation

## 2021-11-14 NOTE — Progress Notes (Signed)
Subjective:    Patient ID: Carly [name], female    DOB: 09-08-1951, 71 y.o.   MRN: [id number]  HPI  An audio/video tele-health visit is felt to be the most appropriate encounter for this patient at this time. This is a follow up tele-visit via phone. The patient is at home. MD is at office. Prior to scheduling this appointment, our staff discussed the limitations of evaluation and management by telemedicine and the availability of in-person appointments. The patient expressed understanding and agreed to proceed.    Carly [name] is a 71 year old woman who was admitted to CIR with white matter periventricular CVA, presents for follow-up regarding anxiety, insomnia, OSA, and weight gain.    1) Anxiety:  -her psychiatrist previously transitioned her from Seroquel back to benzodiazepine due to side effect profile of Seroquel, but this worsened her anxiety so we restarted it -she has been having a lot of anxiety related to her and her husband's move from their current home into a retirement home and she asks whether she may take the seroquel additionally as needed- once in the morning, once later in the day, and two at night -she is interested in following with a behavioral therapist -she and husband [name] were scared of side effects of Seroquel that were discussed- she has not noted any this far.  -she has been sleeping but her legs feel very stiff at night -she continues to take Cymbalta 60mg  -we tried weaning her off the mirtazepine but she experienced weakness and insomnia and wanted to restart- titrated back up to her former dose.  -she discussed with her husband, who is also present on this call today, and she would prefer to restart Seroquel and stop Lorazepam.  -she has not had any counseling and would like to restart this. She has an appointment with [name] in August and would like to start counseling earlier than this if possible.  -she loved speaking with [name]  inpatient and would like to follow with him -she had symptoms of dizziness yesterday at the hairdresser and was asked to contact me regarding if this could be from the seroquel. She had had no other similar episodes since starting it.  -she has been sleeping well with the Seroquel at night.  -she would like a list of foods that can help anxiety.  -she asks about the dietary advice I gave her last visit regarding anxiety. -she has been using essential oil.  -the prednisone has caused weight gain.   2) MS: -has been having a lot of stiffness recently -EMS called twice due to difficulty getting her up off the floor -is receiving home therapy -she would like to see a different neurologist and asked for my recommendation   3) Lymphedema  -has significant bilateral lower extremity  -has greatly improved with lymphedema therapy -lasix 20mg  did not help enough.  -fluid has increased. -has ordered compression garments -does not eat processed foods or salt.  -she has ordered something from [employer] to give her lymphatic massage.  -improving but still very bothersome to her -discussed bloodwork today  4) HTN: SBP has been well controlled, but still with high sytolics of [id number] at times  5) Decreased appetite:  -weaning off of [name] has caused decreased appetite and decreased energy.   6) She developed pain in right side, and not sure if this is due to topamax or the antibiotics she is on to treat a respiratory infection.   7) Insomnia:  -sleeping  poorly due to her CPAP mask -respiratory therapist will seeing her tomorrow.  -she asks about [employer].   8) Chest congestion -she has been having symptoms of congestion for greater than 3 months -Her father saw 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 She received a call to schedule an appointment with [name] office and is thankful for the referral.  -she asks what  else she can do to help with this -she has been through multiple rounds of antibiotics without benefit.  -also feeling diffuse joint aches.   9) GERD -has been experiencing after eating  10) Sinus congestion -pleased that her CT results were normal.   11) OSA -she notes that she was found to have OSA on recent sleep study and she was tearful about this as she feels it is just another condition she has -she was surprised as she feels that she sleeps well.  -she has note been sleeping well while using the CPAP mask and was told that O2 is leaking -she is working with a respiratory therapist -she feels anxiety about whether she will get enough sleep tonight, she has only been sleeping about 4 hours since using the CPAP -she asks about [name] as she saw an ad for this on TV   Pain Inventory Average Pain 5 Pain Right Now 2 My pain is aching and no pain.  Sometimes spasm in legs at night because of MST  In the last 24 hours, has pain interfered with the following? General activity 4 Relation with others 7 Enjoyment of life 5  What TIME of day is your pain at its worst? night Sleep (in general) Fair  Pain is worse with: sitting and unsure Pain improves with: medication Relief from Meds: 5  Family History  Problem Relation Age of Onset   Arthritis Mother    Hypertension Mother    Macular degeneration Mother    Hypertension Father    Hyperlipidemia Father    Heart disease Maternal Grandfather    Diabetes Maternal Grandfather    Kidney disease Paternal Grandmother    Social History   Socioeconomic History   Marital status: Married    Spouse name: Engineer, technical sales   Number of children: 3   Years of education: 14   Highest education level: Associate degree: academic program  Occupational History   Not on file  Tobacco Use   Smoking status: Never   Smokeless tobacco: Never  Vaping Use   Vaping Use: Never used  Substance and Sexual Activity   Alcohol use: No    Alcohol/week:  0.0 standard drinks   Drug use: No   Sexual activity: Not Currently  Other Topics Concern   Not on file  Social History Narrative   Lives with husband    Right handed   Caffeine: 1 cup of coffee in the AM, coke occa. Trying to come off caffeine    Social Determinants of Health   Financial Resource Strain: Not on file  Food Insecurity: Not on file  Transportation Needs: Not on file  Physical Activity: Not on file  Stress: Not on file  Social Connections: Not on file   Past Surgical History:  Procedure Laterality Date   BACK SURGERY     CHOLECYSTECTOMY     COLONOSCOPY WITH PROPOFOL N/A 05/18/2017   Procedure: COLONOSCOPY WITH PROPOFOL;  Surgeon: [name], MD;  Location: [facility] ENDOSCOPY;  Service: Endoscopy;  Laterality: N/A;   FOOT SURGERY  2015   GALLBLADDER SURGERY  2008   HARDWARE REMOVAL Left 02/14/2016   Procedure: LEFT FOOT REMOVAL DEEP IMPLANT;  Surgeon: [name], MD;  Location: [facility];  Service: Orthopedics;  Laterality: Left;   HERNIA REPAIR     Inguinal Hernia Repair   SPINE SURGERY  2014   Past Surgical History:  Procedure Laterality Date   BACK SURGERY     CHOLECYSTECTOMY     COLONOSCOPY WITH PROPOFOL N/A 05/18/2017   Procedure: COLONOSCOPY WITH PROPOFOL;  Surgeon: [name], MD;  Location: [facility] ENDOSCOPY;  Service: Endoscopy;  Laterality: N/A;   FOOT SURGERY  2015   GALLBLADDER SURGERY  2008   HARDWARE REMOVAL Left 02/14/2016   Procedure: LEFT FOOT REMOVAL DEEP IMPLANT;  Surgeon: [name], MD;  Location: [place];  Service: Orthopedics;  Laterality: Left;   HERNIA REPAIR     Inguinal Hernia Repair   SPINE SURGERY  2014   Past Medical History:  Diagnosis Date   Allergy    Anxiety    Aspiration pneumonia (HCC)    Depression    Frequent headaches    H/O   GERD (gastroesophageal reflux disease)    History of chicken pox    History of colon polyps    Hx of migraines    Multiple sclerosis ([place]) 2011    OSA (obstructive sleep apnea)    PONV (postoperative nausea and vomiting)    There were no vitals taken for this visit.  Opioid Risk Score:   Fall Risk Score:  `1  Depression screen PHQ 2/9  Depression screen [facility] 2/9 11/05/2021 08/27/2021 08/15/2021 07/19/2021 06/18/2021 05/31/2021 03/28/2021  Decreased Interest 0 2 0 1 3 3  0  Down, Depressed, Hopeless 1 2 0 1 3 3  0  PHQ - 2 Score 1 4 0 2 6 6  0  Altered sleeping - - - - - - 0  Tired, decreased energy - - - - - - 0  Change in appetite - - - - - - 0  Feeling bad or failure about yourself  - - - - - - 0  Trouble concentrating - - - - - - 0  Moving slowly or fidgety/restless - - - - - - 0  Suicidal thoughts - - - - - - 0  PHQ-9 Score - - - - - - 0  Difficult doing work/chores - - - - - - Not difficult at all  Some recent data might be hidden    Review of Systems  Constitutional: Negative.   HENT: Negative.    Eyes: Negative.   Respiratory:  Negative for cough.        Chest congestion  Cardiovascular: Negative.   Gastrointestinal: Negative.   Endocrine: Negative.   Genitourinary: Negative.   Musculoskeletal:  Positive for back pain and gait problem.  Skin: Negative.   Allergic/Immunologic: Negative.   Hematological:  Bruises/bleeds easily.       Plavix  Psychiatric/Behavioral:  Positive for dysphoric mood.   All other systems reviewed and are negative.     Objective:   Physical Exam Patient seen via phone     Assessment & Plan:  1) Anxiety: -discussed with patient the side effects of both Seroquel and Lorazepam, as well as potential interactions with other medications. -discussed her move because it is stressing her.  -continue Seroquel 12.5mg  3 times per day PRN -advised to always use the lowest dose of medications that she needs to minimize side effects -discussed  the buspar that was started for her by Dr. Kerman Passey- she has not yet noted benefits from this, advised that she does not need to take it if she does not feel  benefits.  -referred to psychiatry in [facility] for CBT -reinforced importance of thinking positively.  -continue to encourage non-pharmacologic management approaches as well, such as meditation, applying lavender oil, and exercise- all of which she has been trying and which have been helping.  -discussed alternative anxiety medications.  -continue seroquel 12.5mg  daily and 25mg  at night. Can decreased to 25mg  at night and assess positive/negative effects during the day. Can restart 1/2 tab during the day if anxiety is uncontrolled.  -discontinue lorazepam.  -establish care with [name] for neuropsych counseling on 8/2. They really valued the appointment with him.  -discontinue topamax in case pain in right side is due to medication- discussed can also be due to antibiotics which she is taking for respiratory infection- discontinuing the topamax will help [place] know if it is the cause of her pain or not. -continue [name] 30mg  HS.  -Discussed exercise and meditation as tools to decrease anxiety. -Recommended Down Dog Yoga app -Discussed spending time outdoors. -Discussed positive re-framing of anxiety.  -Discussed the following foods that have been show to reduce anxiety: 1) [place] nuts, mushrooms, soy beans due to their high selenium content. Upper limit of toxicity of selenium is 483mcg/day so no more than 3-4 [place] nuts per day.  2) Fatty fish such as salmon, mackerel, sardines, trout, and herring- high in omega-3 fatty acids 3) Eggs- increases serotonin and dopamine 4) Pumpkin seeds- high in omega-3 fatty acids 5) dark chocolate- high in flavanols that increase blood flow to brain 6) turmeric- take with black pepper to increase absorption 7) chamomile tea- antioxidant and anti-inflammatory properties 8) yogurt without sugar- supports gut-brain axis 9) green tea- contains L- theanine 10) blueberries- high in vitamin C and antioxidants 11) [place]- high in tryptophan which gets  converted to serotonin 12) bell peppers- rich in vitamin C and antioxidants 13) citrus fruits- rich in vitamin C and antioxidants 14) almonds- high in vitamin E and healthy fats 15) chia seeds- high in omega-3 fatty acids  2) MS -continue home therapy to minimize stiffness/maximize mobility -f/u with MS specialist. Discussed her appointmenr with Dr. Kerman Passey.  -provided with neurology referral  3) HTN: BP has been 130s-150s/80s, recommended citrus foods and nuts, as much mobility as she can tolerate, log Bps daily and bring log to f/u appointment.  -continue current regimen and checking and checking BP.  -advised citrus foods and nuts to help lower BP. Discussed that once BP is consistently 120/80 we can wean her Losartan to 25mg .   4) Bilateral lower extremity edema: - continue lymphedema therapy which is greatly helping.  -discussed response to Lasix. Continue lasix 20mg  -Discussed creatinine and potassium normal on recent labs o can continue lasix -advised low salt diet.  -advised getting the compression garments as these can be very helpful.   5) Insomnia: -recommended tart cherry juice with dinner, valerian root and chamomile teas in evening, applying lavender drops to forehead at night. -continue [name] 30mg  HS- discussed that we will not try to wean off again since we saw how much this is benefittng her -discontinue amantadine.   6) Obesity BMI 33.13 -attempted to wean off [name] but she developed worsening insomnia, anxiety, and decreased appetite that was undesirable to her, so have restarted the medication.  -discussed trial of avoiding gluten for 3 months and  how this could potentially help with her weight loss, anxiety, and prevention/slowing of her chronic diseases. -discussed benefits of intermittent fasting- made goal to set a consistent dinner time and avoid snacking after this time. If she is hungry, choose a healthy snack of nuts, fruit, or vegetables.   7)  Chest congestion -referred for pulmonary consult with [name], who treated her father for pulmonary fibrosis. She has made an appointment to establish care with him.  -recommended drinking daily warm water with honey, lime and ginger -recommended drinking tea with holy Basil (Tulsi)  8) Sore throat: -continue salt water gargling  9) Sinus congestion: -recommended humidifier -drink 6-8 glasses of water per day.   10) fatigue -checked B12, folate, TSH, vitamin D, magnesium and discussed bloodwork with patient and husband: all in normal range except for suboptimal Vitamin D- high dose supplement prescribed -Recommended starting NAC (N-Acetyl cysteine) 600mg  BID for her fatigue. Discussed its benefits in boosting glutathione and mitochondrial function.   11) OSA -discussed that OSA can contribute to daytime fatigue, weight gain, anxiety, and chronic conditions so it is good to get this condition treated. -referred to Dr. Melida Quitter for eval for [facility] given that she is having a hard time sleeping with her CPAP. -advised her to talk to her respiratory therapist about fitting another mask -discussed [name] but she says unfortunately it is not covered by insurance.  -discussed strap under the chin.   5 minutes spent in discussion of BMP and that creatinine and potassium are normal so she can continue current lasix dose.

## 2021-11-15 ENCOUNTER — Ambulatory Visit: Payer: Medicare Other

## 2021-11-18 ENCOUNTER — Ambulatory Visit: Payer: Medicare Other | Attending: Internal Medicine | Admitting: Occupational Therapy

## 2021-11-18 ENCOUNTER — Other Ambulatory Visit: Payer: Self-pay

## 2021-11-18 ENCOUNTER — Telehealth: Payer: Self-pay | Admitting: *Deleted

## 2021-11-18 DIAGNOSIS — I890 Lymphedema, not elsewhere classified: Secondary | ICD-10-CM | POA: Insufficient documentation

## 2021-11-18 DIAGNOSIS — M6281 Muscle weakness (generalized): Secondary | ICD-10-CM | POA: Insufficient documentation

## 2021-11-18 DIAGNOSIS — R482 Apraxia: Secondary | ICD-10-CM | POA: Insufficient documentation

## 2021-11-18 DIAGNOSIS — R278 Other lack of coordination: Secondary | ICD-10-CM | POA: Insufficient documentation

## 2021-11-18 DIAGNOSIS — R2689 Other abnormalities of gait and mobility: Secondary | ICD-10-CM | POA: Diagnosis not present

## 2021-11-18 DIAGNOSIS — R2681 Unsteadiness on feet: Secondary | ICD-10-CM | POA: Insufficient documentation

## 2021-11-18 NOTE — Telephone Encounter (Signed)
Mrs Capron is asking to change her lasix medication.

## 2021-11-18 NOTE — Therapy (Signed)
[place] MAIN [facility] SERVICES [address] [place], [place], [address] Phone: [phone]   Fax:  [phone]  Occupational Therapy Treatment  Patient Details  Name: Carly [name] MRN: [id number] Date of Birth: 22-Sep-1951 Referring Provider (OT): Einar Pheasant, MD   Encounter Date: 11/18/2021   OT End of Session - 11/18/21 1408     Visit Number 15    Number of Visits 36    Date for OT Re-Evaluation 12/12/21    OT Start Time 0100    OT Stop Time 0206    OT Time Calculation (min) 66 min    Equipment Utilized During Treatment tyvek slipper sock, friction gloves    Activity Tolerance Patient tolerated treatment well;No increased pain    Behavior During Therapy WFL for tasks assessed/performed             Past Medical History:  Diagnosis Date   Allergy    Anxiety    Aspiration pneumonia (HCC)    Depression    Frequent headaches    H/O   GERD (gastroesophageal reflux disease)    History of chicken pox    History of colon polyps    Hx of migraines    Multiple sclerosis ([place]) 2011   OSA (obstructive sleep apnea)    PONV (postoperative nausea and vomiting)     Past Surgical History:  Procedure Laterality Date   BACK SURGERY     CHOLECYSTECTOMY     COLONOSCOPY WITH PROPOFOL N/A 05/18/2017   Procedure: COLONOSCOPY WITH PROPOFOL;  Surgeon: [name], MD;  Location: [facility] ENDOSCOPY;  Service: Endoscopy;  Laterality: N/A;   FOOT SURGERY  2015   GALLBLADDER SURGERY  2008   HARDWARE REMOVAL Left 02/14/2016   Procedure: LEFT FOOT REMOVAL DEEP IMPLANT;  Surgeon: [name], MD;  Location: [place];  Service: Orthopedics;  Laterality: Left;   HERNIA REPAIR     Inguinal Hernia Repair   SPINE SURGERY  2014    There were no vitals filed for this visit.   Subjective Assessment - 11/18/21 1314     Subjective  Carly [name] presents to OT for Rx visit  15/36 to addressBLE lymphedema (LE). Pt is  accompanied by paid caregiver.  Pt denies pain in legs associated with LE. Pt scheduled visit last minute due to sudden increase in B leg swelling over the weekend. Pt reports R leg better and she is able to don compression stocking. LLE swelling has improved except for distal leg and foot. She reports she is concerned that the off-the-shelf- pneumatic compression "pump" from [place] she has been using lately may have caused the swelling to get squezed the wrong way. As session went on Pt repoted that  last5 week she reduced her diuretic dose by 50% with her Dr's permission.    Patient is accompanied by: Family member    Pertinent History Relevant to limb swelling: Depression /anxiety, MS, HTN. 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 weakness,    Special Tests Intake FOTO: 29/100 (functional outcome score)    Patient Stated Goals to be able to walk better, to get this swelling down. I hate it.    Pain Onset More than a month ago   leg pain and swelling first noticed during long care trip ~ 7 hours  OT Treatments/Exercises (OP) - 11/18/21 1438       ADLs   ADL Education Given Yes      Manual Therapy   Manual Therapy Edema management    Manual Lymphatic Drainage (MLD) LLE/LLQ MLD utilizing modified short neck sequence ( no lateral neck strokes) deep abdominal pathways (diaphragmatic breathing), functional inguinal LN, and proximal to distal strokes to thigh, bottleneck at knee, leg and foot. # retrograde full sequences from too to clavical to end. Good tolerance    Compression Bandaging LLE knee length multilayer chort stretch compression wrap from base of toes to tibial tuberosity utilizing 04. cm thick Rosidal foam in single circumferential layer over stockinett, layered with one 10  and one 12 cm wide Complinan shoret stretchg wrap.                    OT Education - 11/18/21 1440     Education Details Reviewed again how overload of systemic body fluid, aka fluid retention from kidneys and / or  hypertension, etc, puts a strain on already -strained  lymphatics, resulting in increased bilateral leg swelling and fluid retention all over in addition to lymphedema. Pt reminded that some medications , such as Amlodipine, may have peripheral limb swelling as a side effect. Enthusiastically urged Pt not to modify dosage of her diuretic without speaking with her PCP and without specific instructions from her doctor.    Person(s) Educated Patient;Caregiver(s)    Methods Explanation;Demonstration    Comprehension Verbalized understanding;Returned demonstration;Need further instruction                 OT Long Term Goals - 11/07/21 1242       OT LONG TERM GOAL #1   Title Given this patients risk-adjustment variables, her Intake Functional Status score of 29/100 on the FOTO tool, patient will experience at least an increase in function of 3 points for a score of 32/100.    Baseline 97/100    Time 12    Period Weeks    Status On-going    Target Date 12/12/21      OT LONG TERM GOAL #2   Title Pt will be able to verbalize at least 4 lymphedema precautions and prevention strategies with Max caregiver assist to reduce infection risk limit progression of lymphedema.    Baseline dependent    Time 6    Period Days    Status Achieved   In progress. Pt and spouse have short term memory issues that is delaying this goal.We continue to review each session, also with paid caregivers.     OT LONG TERM GOAL #3   Title Pt will be able to apply multi-layer, short stretch compression wraps to one leg at a time using correct gradient techniques with max assistance in an effort  to return the affected  limb(s)  to premorbid size and shape, to limit pain and infection risk, and  to improve functional mobility and ambulation  for ADLs.    Baseline dependent    Time 6    Period Days    Status Achieved      OT LONG TERM GOAL #4   Title Pt will achieve at least a 10% limb volume reduction bilaterally to limit lymphedema progression, to improve tissue health and limit infection risk, and to increase tissue flexibility essential for optimal AROM and safe functional ambulation and mobility.    Baseline dependent    Time 12    Period Weeks  Status Partially Met   By visual assessment I estimate Pt has achieved at least a 10% limb volume reduction in the LLE below the knee. Garments have been ordered . will assess w formal volumetrics next session.     OT LONG TERM GOAL #5   Title With Max caregiver assistance Pt will achieve and sustain a least 85% compliance with all LE self-care home program components throughout Intensive Phase CDT, including frequent elevation when seated, daily skin inspection and care, lymphatic pumping ther ex, 23/7 compression wraps and simple self-MLD, to sustain clinical gains made in CDT and to limit lymphedema progression and further functional decline.    Baseline dependent    Time 12    Period Weeks    Status Achieved      OT LONG TERM GOAL #6   Title Using assistive devices (modified independence)  and Max CG assistance Pt will be able to don and doff appropriate daytime compression garments  to limit lymphatic re-accumulation and LE progression before transitioning to self-management phase of CDT.    Baseline dependent    Time 12    Period Weeks    Status Achieved                   Plan - 11/18/21 1409     Clinical Impression Statement Carly [name] last seen 1/11 for OT Rx visit. She returns today with concern re sudden increase in BLE swelling over the weeken, L>R. Pt reports RLE swelling has decreased to baseline with elevation and compression, Spouse applied 2 layer , knee length, gradient, compression wrap yesterday  (Sunday) to LLE in effort to reduce swelling, which has helped some, but LLE swelling remains concentrated in foot and ankle and has not returned to baseline. Swelling appears to be systemic as both thighs are roughly equally swollen by visual assessment and palpation. Provided MLD to LLE/LLQ as established with mild reduction in distal leg and foot swelling,  but foot and ankle remain stubborn with non-pitting, protien ritch lymphatic congestion. After trouble shooting sudden swelling throughout session Pt reported that she has been using a thigh length compression device she purchased on [place] as the advanced sequential pneumatic compression device, the Flexitouch, recommended fby this OT has still not been cleared by her insurance company. Pt also mentioned that she has reduced her diuretic dose this past week from 40 mg? in half to 20 mg?.  Systemic fluid retention from inadequate diuresis is most likely the source of excess lymphatic congestion at present due to strain of  systemic fluid overload. Recommened Pt call PCP when she arrives home and discuss this dosage changes and resultant swelling. Pt may be able to increase the dose somewhat with effective fluid management with less increase in urination , which limits her ability to leave home. After MLD reaplied LLE gradient  compression wrap. Pt instructed to don shoe over L foot ASAP and keep legs elevated when seated at home until she is able to resume LLE compression stocking. Pt instructed to call PRN w questions/ concerns. Cont OT for LE care as per POC seeing Pt for follow along until Flexitouch is obtained and power wc with seating system designed to enable gravity assited lymphedema management is fitted.    OT Occupational Profile and History Comprehensive Assessment- Review of records and extensive additional review of physical, cognitive, psychosocial history related to current functional performance    Occupational performance deficits (Please  refer to evaluation for details): ADL's;IADL's;Work;Leisure;Social Participation;Other  body image   Body Structure / Function / Physical Skills ADL;Edema;Skin integrity;Pain;Decreased knowledge of precautions;Decreased knowledge of use of DME;IADL    Rehab Potential Fair    Clinical Decision Making Multiple treatment options, significant modification of task necessary    Comorbidities Affecting Occupational Performance: Presence of comorbidities impacting occupational performance    Comorbidities impacting occupational performance description: See SUBJECTIVE for relevant Dx    Modification or Assistance to Complete Evaluation  Max significant modification of tasks or assist is necessary to complete    OT Frequency 2x / week    OT Duration 12 weeks   and PRN for follow along support and caregiver support   OT Treatment/Interventions Self-care/ADL training;Therapeutic exercise;Coping strategies training;Therapeutic activities;Manual lymph drainage;Energy conservation;Manual Therapy;Other (comment);DME and/or AE instruction;Compression bandaging;Patient/family education   skin care to limit infection risk and increase skin flexibility   Plan Comple Decongestive Therapy: Manual Lymphatic Drainage (MLD, skin care, therapeutic exercise, compression therapy using short stretch    wraps and compression garments/ devices to retain clinical gains    Recommended Other Services Consider trial with advanced sequential pneumatic compression device, or "pump". Flexitouch device recommended over all as this stimulates lymphatics proximally to distal duplicating anatomic function. Need MD clearance due to Hx of CVA and MS. Pt may not tolerate 2/2 anxiety, but will explore her interest. Consider alternative to Amlodipine due to limb swelling side effect.    Consulted and Agree with Plan of Care Family member/caregiver;Patient    Family Member Consulted Spouse, Bill             Patient will benefit from skilled  therapeutic intervention in order to improve the following deficits and impairments:   Body Structure / Function / Physical Skills: ADL, Edema, Skin integrity, Pain, Decreased knowledge of precautions, Decreased knowledge of use of DME, IADL       Visit Diagnosis: Lymphedema, not elsewhere classified    Problem List Patient Active Problem List   Diagnosis Date Noted   Pre-op evaluation 08/31/2021   Rash 01/27/2021   History of CVA (cerebrovascular accident) 01/05/2021   PBA (pseudobulbar affect)    MS (multiple sclerosis) ([place])    Anxiety and depression    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   Generalized anxiety disorder 12/11/2020   Acute focal neurological deficit 12/08/2020   Edema 09/28/2020   Bloating 08/25/2020   Environmental allergies 07/17/2019   Hyperglycemia 07/17/2019   Cough 06/06/2019   Memory change 05/29/2019   Abnormal liver function tests 01/09/2019   Hemorrhoid 09/27/2018   Nausea 08/11/2018   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   Depression with anxiety 02/04/2018   Cognitive and behavioral changes 02/04/2018   Constipation 11/17/2017   Hx of adenomatous colonic polyps 06/06/2016   Lower extremity edema 01/13/2016   Bilateral ovarian cysts 10/16/2015   Health care maintenance 10/16/2015   Colon polyp 01/25/2015   Bone/cartilage disorder 01/25/2015   Headache 11/18/2014   Depression, recurrent ([place]) 11/18/2014   Greater tuberosity of humerus fracture 11/18/2014   Multiple sclerosis ([place]) 11/18/2014   Unsteady gait 11/18/2014   Dizziness 11/18/2014   Inconclusive mammogram 03/15/2013   S/P lumbar spinal fusion 01/04/2013   Surgery, other elective 12/03/2012   DDD (degenerative disc disease), lumbosacral 10/15/2012   Back ache 09/06/2012   Hypertension 09/06/2012   Back pain 09/06/2012   Hypercholesterolemia 01/28/2012   Climacteric 01/28/2012   Routine general medical examination at a health care facility 01/28/2012    Avitaminosis D 01/28/2012   Andrey Spearman, MS, OTR/L,  CLT-LANA 11/18/21 2:50 PM    [place] MAIN [facility] SERVICES [address] [place], [place], [address] Phone: [phone]   Fax:  [phone]  Name: Carly [name] MRN: [id number] Date of Birth: 01-26-1951

## 2021-11-19 ENCOUNTER — Other Ambulatory Visit: Payer: Self-pay | Admitting: Physical Medicine and Rehabilitation

## 2021-11-19 ENCOUNTER — Ambulatory Visit: Payer: Medicare Other

## 2021-11-19 DIAGNOSIS — R2689 Other abnormalities of gait and mobility: Secondary | ICD-10-CM | POA: Diagnosis not present

## 2021-11-19 DIAGNOSIS — R278 Other lack of coordination: Secondary | ICD-10-CM | POA: Diagnosis not present

## 2021-11-19 DIAGNOSIS — M6281 Muscle weakness (generalized): Secondary | ICD-10-CM | POA: Diagnosis not present

## 2021-11-19 DIAGNOSIS — I890 Lymphedema, not elsewhere classified: Secondary | ICD-10-CM | POA: Diagnosis not present

## 2021-11-19 DIAGNOSIS — R482 Apraxia: Secondary | ICD-10-CM | POA: Diagnosis not present

## 2021-11-19 DIAGNOSIS — R2681 Unsteadiness on feet: Secondary | ICD-10-CM

## 2021-11-19 MED ORDER — FUROSEMIDE 10 MG/ML PO SOLN: 30.0000 mg | Freq: Every day | ORAL | 12 refills | Status: DC

## 2021-11-19 NOTE — Telephone Encounter (Signed)
I have notified her of the liquid dosing.

## 2021-11-19 NOTE — Telephone Encounter (Signed)
She says she needs some "10's" because she is trying to go down to 30 mg dose.

## 2021-11-19 NOTE — Therapy (Signed)
[place] MAIN [facility] SERVICES [address] [place], [place], [address] Phone: [phone]   Fax:  [phone]  Physical Therapy Treatment  Patient Details  Name: Carly [name] MRN: [id number] Date of Birth: Oct 06, 1951 Referring Provider (PT): Dr. Einar Pheasant for wheelchair eval for 11/12/2021   Encounter Date: 11/19/2021   PT End of Session - 11/19/21 1207     Visit Number 19    Number of Visits 24    Date for PT Re-Evaluation 01/23/22    Authorization Time Period 08/08/21-10/31/20; Recert 12/26/1759- 03/19/3709    Progress Note Due on Visit 20    PT Start Time 1102    PT Stop Time 1146    PT Time Calculation (min) 44 min    Equipment Utilized During Treatment Gait belt    Activity Tolerance Patient tolerated treatment well    Behavior During Therapy WFL for tasks assessed/performed             Past Medical History:  Diagnosis Date   Allergy    Anxiety    Aspiration pneumonia ([place])    Depression    Frequent headaches    H/O   GERD (gastroesophageal reflux disease)    History of chicken pox    History of colon polyps    Hx of migraines    Multiple sclerosis ([place]) 2011   OSA (obstructive sleep apnea)    PONV (postoperative nausea and vomiting)     Past Surgical History:  Procedure Laterality Date   BACK SURGERY     CHOLECYSTECTOMY     COLONOSCOPY WITH PROPOFOL N/A 05/18/2017   Procedure: COLONOSCOPY WITH PROPOFOL;  Surgeon: [name], MD;  Location[facility] ENDOSCOPY;  Service: Endoscopy;  Laterality: N/A;   FOOT SURGERY  2015   GALLBLADDER SURGERY  2008   HARDWARE REMOVAL Left 02/14/2016   Procedure: LEFT FOOT REMOVAL DEEP IMPLANT;  Surgeon: [name], MD;  Location: [facility];  Service: Orthopedics;  Laterality: Left;   HERNIA REPAIR     Inguinal Hernia Repair   SPINE SURGERY  2014    There were no vitals filed for this visit.   Subjective Assessment - 11/19/21 1058     Subjective Pt  moving tomorrow. Pt reprorts no pain currently but does report some sinus congestion. Pt reports onset occured yesterday. Pt states, "I need my sleep mask to work." Pt reports upcoming sleep study. Pt reports practicing her HEP.    Patient is accompained by: Family member    Pertinent History Patient is a 71 year old female referral for power wheelchair evaluation from Dr. Einar Pheasant. 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  Patient reports her current manual w/c is insufficient for her needs- She states she is unable to manuever independently. She reports multiple falls (3) in past 6 months.    Limitations Standing;Walking    How long can you sit comfortably? no issues    How long can you stand comfortably? 15 min.    How long can you walk comfortably? 10 min.    Patient Stated Goals Standing, walking, transfers    Currently in Pain? No/denies    Pain Onset More than a month ago   leg pain and swelling first noticed during long care trip ~ 7 hours           INTERVENTIONS:       Exercise/Activity  Sets/Reps/Time/ Resistance Assistance Charge type Comments  Seated AROM hip march  2 sets of 10 reps each LE   set up and VC/TC  Therex Cuing for large amplitude movement, pt rates as medium   Gait in gym/clinic  15 feet x 1 trial Min assist with use of gait belt and close w/c follow- Using RW Gait train Pt quick to fatigue on this date and requests rest break. Pt exhibits decrease hip ext/increased B knee flex. Pt cued for bigger step, upright posture and proximity to RW.   LAQ with 3# weights donned each LE 1x10 alternating  1x20 alternating   TherEx  Pt still unable to achieve full ROM with left and progressively decreased ability to achieve full ext B with 2nd set. Cuing for large amplitude and quick movement. Pt with minimal improvement following cueing likely due to fatigue.   Soccer  ball kicks into cones with 3# ankle weights X several minutes to encourage B LE power  TherEx PT also provides demo/TC for large amplitude movement. Addition of coordination challenge. Pt does improve following cuing.   Hamstring curls RTB alternating LE's    1 sets of 20 reps B 1x10 LLE with RTB 1x10 RLE with BTB   TherEx Pt reports easy on RLE and difficult on LLE with RTB  STS 3x5 pulling to stand with BUES 3x4 pushing with one UE and pulling with other UE CGA TherEx VC/TC for UE positioning/technique  Chair dips 2x5  TherEx Performed through limited ROM  RTB tricep ext. 2x10 B  TherEx         Pt required brief rest breaks between exercises due fatigue.     Education provided throughout session via VC/TC and demonstration to facilitate movement at target joints and correct muscle activation for all testing and exercises performed.       PT Education - 11/19/21 1206     Education provided Yes    Education Details exercise technique, transfer techniques    Person(s) Educated Patient    Methods Explanation;Demonstration;Tactile cues;Verbal cues    Comprehension Verbalized understanding;Returned demonstration;Tactile cues required;Verbal cues required;Need further instruction              PT Short Term Goals - 11/12/21 1206       PT SHORT TERM GOAL #1   Title Patient will be independent in home exercise program to improve strength/mobility for better functional independence with ADLs.    Baseline Pt does not have HEP; 09/25/2021- Patient reports compliant wth HEP consisting of LE strengthening.    Time 4    Period Weeks    Status Achieved    Target Date 09/05/21      PT SHORT TERM GOAL #2   Title Patient will increase FOTO score to equal to or greater than 4    to demonstrate statistically significant improvement in mobility and quality of life.    Baseline 28; 09/25/2021- will reassess next visit; 10/31/2021= 24    Time 6    Period Weeks    Status On-going    Target  Date 12/12/21      PT SHORT TERM GOAL #3   Title Pt and caregivers will understand PT recommendation and appropriate/safe use for wheelchair and seating for home use.    Baseline 11/12/2021- Patient is currently limited to manual w/c and difficulty with transfers and dependent on caregiver for navigation.    Time 1    Period Days    Status Achieved    Target  Date 11/12/21               PT Long Term Goals - 10/31/21 1411       PT LONG TERM GOAL #1   Title Patient will be independent with advanced and progressive home program for strength and endurance in order to transition to self management    Baseline Pt does not have current formal HEP; 09/25/2021- Patient does report compliance with initial HEP consisting on seated therex for LE strengthening. 10/31/2021= Patient is performing initial HEP with seated LE strengthening but will benefit from progressive strengthening including standing/walking/balance    Time 12    Period Weeks    Status On-going    Target Date 01/23/22      PT LONG TERM GOAL #2   Title pt's FOTO score will improve by 4 points or more indicating improved confidence with daily tasks at home    Baseline 28 at intake: 09/25/2021- Will assess next visit; 10/31/2021= 24    Time 12    Period Weeks    Status On-going    Target Date 01/23/22      PT LONG TERM GOAL #3   Title Patient will deny any falls over past 6 weeks to demonstrate improved safety awareness at home and work.    Baseline Pt reports 3 falls in previous 6 months; 09/25/2021= No falls reported and patient has been focusing on walking  some in clinic.    Time 6    Period Weeks    Status Achieved      PT LONG TERM GOAL #4   Title Patient will perform sit to and from stand from her wheelchair with min assist and no upper extremity support in order to indicate improvement in function.    Baseline Patient requires mod assist from author and bilateral lower extremity support to transition to and from  sitting. 09/25/2021- Patient continues to require repetitive VC, TC, and min physical assistance to stand as wel as B UE Support.  10/31/2021= Patient continues to present with LE weakness- requires BUE support on armrest as well as min physical assist to stand.    Time 12    Period Weeks    Status On-going    Target Date 01/23/22      PT LONG TERM GOAL #5   Title Pt will increase 10MWT by at least 0.13 m/s in order to demonstrate clinically significant improvement in community ambulation.    Baseline 09/25/2021=10 MWT= 0.045 m/s using front wheeled walker. 10/31/2021= 0.056 m/s using front wheeled walker    Time 12    Period Weeks    Status On-going    Target Date 01/23/22      PT LONG TERM GOAL #6   Title Patient will ambulate on level surfaces using front wheeled walker with CGA > 200 feet without rest break or loss of balance for improved ability for household ambulation.    Baseline 09/25/2021= Patient was able to walk 32 feet today with front wheeled walker, Min assist with close w/c follow with decreased heel to toe and decreased step length. 10/31/2021- Patient able to walk approx 45 feet with front wheeled walker with minimal step length and dragging right LE at times    Time 12    Period Weeks    Status New    Target Date 01/23/22                   Plan - 11/19/21 1207  Clinical Impression Statement Pt able to progress interventions in volume and level of resistance used. However, pt still quick to fatigue and require rest breaks. Pt able to perform STS today with pushing through one UE and pulling through the other. This is an improvement from previous sessions, and indicates pt is able to progress toward a safer transfer technique. The pt will benefit from further skilled PT to continue to improve strength, mobility, transfers and gait to increase QOL and safety with ADLs.    Personal Factors and Comorbidities Age;Comorbidity 1;Comorbidity 2;Comorbidity 3+;Time since  onset of injury/illness/exacerbation    Comorbidities Depression, anxiety, HTN, MS, DDD, GERD, Lymphedema    Examination-Activity Limitations Bed Mobility;Carry;Continence;Dressing;Hygiene/Grooming;Lift;Locomotion Level;Squat;Stairs;Stand;Toileting;Transfers    Examination-Participation Restrictions Cleaning;Community Activity;Laundry;Meal Prep;Shop    Stability/Clinical Decision Making Evolving/Moderate complexity    Rehab Potential Fair    Clinical Impairments Affecting Rehab Potential (+) family support, motivated (-) MS, decreased confidence, co morbidities    PT Frequency 2x / week    PT Duration 12 weeks    PT Treatment/Interventions Patient/family education;Gait training;Neuromuscular re-education;Therapeutic exercise;Manual techniques;ADLs/Self Care Home Management;Stair training;DME Instruction;Functional mobility training;Therapeutic activities;Balance training;Wheelchair mobility training;Energy conservation;Joint Manipulations;Cryotherapy;Electrical Stimulation;Moist Heat;Passive range of motion    PT Next Visit Plan gait training, LE  strength training, endurance training    PT Home Exercise Plan Access Code: [email or web address]; no updates; 1/9: Access Code: [email or web address]; no updates    Consulted and Agree with Plan of Care Patient             Patient will benefit from skilled therapeutic intervention in order to improve the following deficits and impairments:  Decreased strength, Decreased balance, Decreased activity tolerance, Decreased endurance, Difficulty walking, Abnormal gait, Improper body mechanics, Decreased mobility, Hypomobility, Impaired flexibility, Impaired perceived functional ability, Decreased range of motion, Pain  Visit Diagnosis: Muscle weakness (generalized)  Other abnormalities of gait and mobility  Unsteadiness on feet     Problem List Patient Active Problem List   Diagnosis Date Noted   Pre-op evaluation 08/31/2021   Rash 01/27/2021   History of CVA  (cerebrovascular accident) 01/05/2021   PBA (pseudobulbar affect)    MS (multiple sclerosis) (HCC)    Anxiety and depression    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   Generalized anxiety disorder 12/11/2020   Acute focal neurological deficit 12/08/2020   Edema 09/28/2020   Bloating 08/25/2020   Environmental allergies 07/17/2019   Hyperglycemia 07/17/2019   Cough 06/06/2019   Memory change 05/29/2019   Abnormal liver function tests 01/09/2019   Hemorrhoid 09/27/2018   Nausea 08/11/2018   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   Depression with anxiety 02/04/2018   Cognitive and behavioral changes 02/04/2018   Constipation 11/17/2017   Hx of adenomatous colonic polyps 06/06/2016   Lower extremity edema 01/13/2016   Bilateral ovarian cysts 10/16/2015   Health care maintenance 10/16/2015   Colon polyp 01/25/2015   Bone/cartilage disorder 01/25/2015   Headache 11/18/2014   Depression, recurrent ([place]) 11/18/2014   Greater tuberosity of humerus fracture 11/18/2014   Multiple sclerosis ([place]) 11/18/2014   Unsteady gait 11/18/2014   Dizziness 11/18/2014   Inconclusive mammogram 03/15/2013   S/P lumbar spinal fusion 01/04/2013   Surgery, other elective 12/03/2012   DDD (degenerative disc disease), lumbosacral 10/15/2012   Back ache 09/06/2012   Hypertension 09/06/2012   Back pain 09/06/2012   Hypercholesterolemia 01/28/2012   Climacteric 01/28/2012   Routine general medical examination at a health care facility 01/28/2012   Avitaminosis D 01/28/2012    Zollie Pee, PT  11/19/2021, 12:11 PM  [place] MAIN [facility] SERVICES [address] [place], [place], [address] Phone: [phone]   Fax:  [phone]  Name: ANTONIO WOODHAMS MRN: [id number] Date of Birth: 1950/12/09

## 2021-11-20 ENCOUNTER — Telehealth: Payer: Self-pay | Admitting: Internal Medicine

## 2021-11-20 NOTE — Telephone Encounter (Signed)
Pt called in requesting referral for OT for hand. Pt stated that she would like to go to [facility] across the [facility] from hospital. Pt requesting callback

## 2021-11-20 NOTE — Telephone Encounter (Signed)
Called patient to follow up. Sent DME order for wheelchair over to [facility] rehab per patient request.

## 2021-11-21 ENCOUNTER — Telehealth: Payer: Self-pay | Admitting: Internal Medicine

## 2021-11-22 ENCOUNTER — Ambulatory Visit: Payer: Medicare Other

## 2021-11-22 ENCOUNTER — Telehealth: Payer: Self-pay | Admitting: Internal Medicine

## 2021-11-22 NOTE — Telephone Encounter (Signed)
Megan from [employer] came into office to drop off written forms and she need last set of records that has lipidemia on them. Placed in provider folder.

## 2021-11-22 NOTE — Telephone Encounter (Signed)
Last in office note printed. Attached to forms and placed in red forms folder for you to sign.

## 2021-11-23 NOTE — Telephone Encounter (Signed)
Signed and placed in box.   

## 2021-11-25 NOTE — Telephone Encounter (Signed)
faxed

## 2021-11-26 ENCOUNTER — Ambulatory Visit: Payer: Medicare Other

## 2021-11-26 ENCOUNTER — Other Ambulatory Visit: Payer: Self-pay | Admitting: Internal Medicine

## 2021-11-26 ENCOUNTER — Telehealth: Payer: Self-pay | Admitting: Internal Medicine

## 2021-11-26 ENCOUNTER — Other Ambulatory Visit: Payer: Self-pay

## 2021-11-26 DIAGNOSIS — I890 Lymphedema, not elsewhere classified: Secondary | ICD-10-CM | POA: Diagnosis not present

## 2021-11-26 DIAGNOSIS — R2689 Other abnormalities of gait and mobility: Secondary | ICD-10-CM | POA: Diagnosis not present

## 2021-11-26 DIAGNOSIS — R278 Other lack of coordination: Secondary | ICD-10-CM

## 2021-11-26 DIAGNOSIS — R482 Apraxia: Secondary | ICD-10-CM | POA: Diagnosis not present

## 2021-11-26 DIAGNOSIS — M6281 Muscle weakness (generalized): Secondary | ICD-10-CM

## 2021-11-26 DIAGNOSIS — R2681 Unsteadiness on feet: Secondary | ICD-10-CM

## 2021-11-26 NOTE — Therapy (Signed)
[place] MAIN [facility] SERVICES [place], [place], [address] Phone: [phone]   Fax:  [phone]  Physical Therapy Treatment/Physical Therapy Progress Note   Dates of reporting period  09/25/2021   to   11/26/2021    Patient Details  Name: Carly [name] MRN: [id number] Date of Birth: 05/21/1951 Referring Provider (PT): Dr. Einar Pheasant for wheelchair eval for 11/12/2021   Encounter Date: 11/26/2021   PT End of Session - 11/26/21 1529     Visit Number 20    Number of Visits 24    Date for PT Re-Evaluation 01/23/22    Authorization Time Period 08/08/21-10/31/20; Recert 2/63/3354- 5/62/5638    Progress Note Due on Visit 20    PT Start Time 1101    PT Stop Time 1144    PT Time Calculation (min) 43 min    Equipment Utilized During Treatment Gait belt    Activity Tolerance Patient tolerated treatment well    Behavior During Therapy WFL for tasks assessed/performed             Past Medical History:  Diagnosis Date   Allergy    Anxiety    Aspiration pneumonia ([place])    Depression    Frequent headaches    H/O   GERD (gastroesophageal reflux disease)    History of chicken pox    History of colon polyps    Hx of migraines    Multiple sclerosis ([place]) 2011   OSA (obstructive sleep apnea)    PONV (postoperative nausea and vomiting)     Past Surgical History:  Procedure Laterality Date   BACK SURGERY     CHOLECYSTECTOMY     COLONOSCOPY WITH PROPOFOL N/A 05/18/2017   Procedure: COLONOSCOPY WITH PROPOFOL;  Surgeon: [name], MD;  Location: [facility] ENDOSCOPY;  Service: Endoscopy;  Laterality: N/A;   FOOT SURGERY  2015   GALLBLADDER SURGERY  2008   HARDWARE REMOVAL Left 02/14/2016   Procedure: LEFT FOOT REMOVAL DEEP IMPLANT;  Surgeon: [name], MD;  Location: [place];  Service: Orthopedics;  Laterality: Left;   HERNIA REPAIR     Inguinal Hernia Repair   SPINE SURGERY  2014    There were  no vitals filed for this visit.   Subjective Assessment - 11/26/21 1106     Subjective Pt reports she hasn't been able to exercise much because she just moved to new home a([place]). Pt reports no pain currently. Pt reports no falls.    Patient is accompained by: Family member    Pertinent History Patient is a 71 year old female referral for power wheelchair evaluation from Dr. Einar Pheasant. 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  Patient reports her current manual w/c is insufficient for her needs- She states she is unable to manuever independently. She reports multiple falls (3) in past 6 months.    Limitations Standing;Walking    How long can you sit comfortably? no issues    How long can you stand comfortably? 15 min.    How long can you walk comfortably? 10 min.    Patient Stated Goals Standing, walking, transfers    Currently in Pain? No/denies    Pain Onset More than a month ago   leg pain and swelling first noticed during long care trip ~ 7 hours           Intervention-  goals reassessed for progress note. Please see goal section for full details.   Has the pt had any falls in the past 6 weeks: Pt reports she almost fell 3 weeks ago at home but stabilized herself on a support surface. Pt reports this occurred due to LLE instability. Otherwise pt reports no falls.   FOTO: 39 (LTG and STG achieved)  STS: Requires BUE support off of chair and CGA from PT  10MWT: 0.06 m/s using front wheeled walker and CGA  Reviewed goal performance with pt and indications for PT. Pt verbalized understanding.   Pt seated in chair: 3# AW on RLE, no AW LLE: -LAQ 2x15 each LE 5# AW on RLE, no AW LLE: -LAQ 1x15 each LE Seated march 2x20 alternating BLEs  Seated DF 2x20 BLEs  Pt educated throughout session about proper posture and technique with exercises. Improved exercise technique,  movement at target joints, use of target muscles after min to mod verbal, visual, tactile cues.  PT Education - 11/26/21 1528     Education provided Yes    Education Details exercise technique, body mechanics, goals    Person(s) Educated Patient    Methods Explanation;Demonstration;Tactile cues;Verbal cues    Comprehension Verbalized understanding;Verbal cues required;Returned demonstration;Tactile cues required               PT Short Term Goals - 11/26/21 1530       PT SHORT TERM GOAL #1   Title Patient will be independent in home exercise program to improve strength/mobility for better functional independence with ADLs.    Baseline Pt does not have HEP; 09/25/2021- Patient reports compliant wth HEP consisting of LE strengthening.    Time 4    Period Weeks    Status Achieved    Target Date 09/05/21      PT SHORT TERM GOAL #2   Title Patient will increase FOTO score to equal to or greater than 4    to demonstrate statistically significant improvement in mobility and quality of life.    Baseline 28; 09/25/2021- will reassess next visit; 10/31/2021= 24; 2/14: 39    Time 6    Period Weeks    Status Achieved    Target Date 12/12/21      PT SHORT TERM GOAL #3   Title Pt and caregivers will understand PT recommendation and appropriate/safe use for wheelchair and seating for home use.    Baseline 11/12/2021- Patient is currently limited to manual w/c and difficulty with transfers and dependent on caregiver for navigation.; 2/14: Pt recently had WC eval. Pt verbalizes use of correct home equipment.    Time 1    Period Days    Status Achieved    Target Date 11/12/21               PT Long Term Goals - 11/26/21 1110       PT LONG TERM GOAL #1   Title Patient will be independent with advanced and progressive home program for strength and endurance in order to transition to self management    Baseline Pt does not have current formal HEP; 09/25/2021- Patient does report  compliance with initial HEP consisting on seated therex for LE strengthening. 10/31/2021= Patient is performing initial HEP with seated LE strengthening but will benefit from progressive strengthening including standing/walking/balance; 2/14: pt not consistently performing her current HEP    Time 12    Period Weeks    Status On-going    Target Date 01/23/22  PT LONG TERM GOAL #2   Title pt's FOTO score will improve by 4 points or more indicating improved confidence with daily tasks at home    Baseline 28 at intake: 09/25/2021- Will assess next visit; 10/31/2021= 24; 2/14: 39    Time 12    Period Weeks    Status Achieved    Target Date 01/23/22      PT LONG TERM GOAL #3   Title Patient will deny any falls over past 6 weeks to demonstrate improved safety awareness at home and work.    Baseline Pt reports 3 falls in previous 6 months; 09/25/2021= No falls reported and patient has been focusing on walking  some in clinic.    Time 6    Period Weeks    Status Achieved      PT LONG TERM GOAL #4   Title Patient will perform sit to and from stand from her wheelchair with min assist and no upper extremity support in order to indicate improvement in function.    Baseline Patient requires mod assist from author and bilateral lower extremity support to transition to and from sitting. 09/25/2021- Patient continues to require repetitive VC, TC, and min physical assistance to stand as wel as B UE Support.  10/31/2021= Patient continues to present with LE weakness- requires BUE support on armrest as well as min physical assist to stand.; 2/14: Pt requires BUE support of of chair armrests and CGA from PT to complete STS    Time 12    Period Weeks    Status On-going    Target Date 01/23/22      PT LONG TERM GOAL #5   Title Pt will increase 10MWT by at least 0.13 m/s in order to demonstrate clinically significant improvement in community ambulation.    Baseline 09/25/2021=10 MWT= 0.045 m/s using front  wheeled walker. 10/31/2021= 0.056 m/s using front wheeled walker; 2/14: 0.06 m/s with front wheeled walker and CGA    Time 12    Period Weeks    Status On-going    Target Date 01/23/22      PT LONG TERM GOAL #6   Title Patient will ambulate on level surfaces using front wheeled walker with CGA > 200 feet without rest break or loss of balance for improved ability for household ambulation.    Baseline 09/25/2021= Patient was able to walk 32 feet today with front wheeled walker, Min assist with close w/c follow with decreased heel to toe and decreased step length. 10/31/2021- Patient able to walk approx 45 feet with front wheeled walker with minimal step length and dragging right LE at times; 2/14: pt ambulates 64m on this date and reports fatigue    Time 12    Period Weeks    Status On-going    Target Date 01/23/22                   Plan - 11/26/21 1539     Clinical Impression Statement Goals reassessed for progress note. Pt making gains in PT AEB meeting FOTO goal and with tolerating more ambulation and increased resistance with strengthening over reporting period. While pt making gains, she did report quick onset of fatigue today following 10MWT, requiring a rest break. Pt also slighly improved 10MWT speed to 0.06 m/s with 2WW and CGA.  Patient's condition has the potential to improve in response to therapy. Maximum improvement is yet to be obtained. The anticipated improvement is attainable and reasonable in a generally predictable  time.  The pt will benefit from further skilled PT to improve strength, endurance, gait, balance and mobility to increase QOL and safety with ADLs.    Personal Factors and Comorbidities Age;Comorbidity 1;Comorbidity 2;Comorbidity 3+;Time since onset of injury/illness/exacerbation    Comorbidities Depression, anxiety, HTN, MS, DDD, GERD, Lymphedema    Examination-Activity Limitations Bed Mobility;Carry;Continence;Dressing;Hygiene/Grooming;Lift;Locomotion  Level;Squat;Stairs;Stand;Toileting;Transfers    Examination-Participation Restrictions Cleaning;Community Activity;Laundry;Meal Prep;Shop    Stability/Clinical Decision Making Evolving/Moderate complexity    Rehab Potential Fair    Clinical Impairments Affecting Rehab Potential (+) family support, motivated (-) MS, decreased confidence, co morbidities    PT Frequency 2x / week    PT Duration 12 weeks    PT Treatment/Interventions Patient/family education;Gait training;Neuromuscular re-education;Therapeutic exercise;Manual techniques;ADLs/Self Care Home Management;Stair training;DME Instruction;Functional mobility training;Therapeutic activities;Balance training;Wheelchair mobility training;Energy conservation;Joint Manipulations;Cryotherapy;Electrical Stimulation;Moist Heat;Passive range of motion    PT Next Visit Plan gait training, LE  strength training, endurance training    PT Home Exercise Plan Access Code: [email or web address]; no updates; 1/9: Access Code: [email or web address]; no updates    Consulted and Agree with Plan of Care Patient             Patient will benefit from skilled therapeutic intervention in order to improve the following deficits and impairments:  Decreased strength, Decreased balance, Decreased activity tolerance, Decreased endurance, Difficulty walking, Abnormal gait, Improper body mechanics, Decreased mobility, Hypomobility, Impaired flexibility, Impaired perceived functional ability, Decreased range of motion, Pain  Visit Diagnosis: Muscle weakness (generalized)  Other abnormalities of gait and mobility  Unsteadiness on feet  Other lack of coordination     Problem List Patient Active Problem List   Diagnosis Date Noted   Pre-op evaluation 08/31/2021   Rash 01/27/2021   History of CVA (cerebrovascular accident) 01/05/2021   PBA (pseudobulbar affect)    MS (multiple sclerosis) (HCC)    Anxiety and depression    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    Generalized anxiety disorder 12/11/2020   Acute focal neurological deficit 12/08/2020   Edema 09/28/2020   Bloating 08/25/2020   Environmental allergies 07/17/2019   Hyperglycemia 07/17/2019   Cough 06/06/2019   Memory change 05/29/2019   Abnormal liver function tests 01/09/2019   Hemorrhoid 09/27/2018   Nausea 08/11/2018   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   Depression with anxiety 02/04/2018   Cognitive and behavioral changes 02/04/2018   Constipation 11/17/2017   Hx of adenomatous colonic polyps 06/06/2016   Lower extremity edema 01/13/2016   Bilateral ovarian cysts 10/16/2015   Health care maintenance 10/16/2015   Colon polyp 01/25/2015   Bone/cartilage disorder 01/25/2015   Headache 11/18/2014   Depression, recurrent ([place]) 11/18/2014   Greater tuberosity of humerus fracture 11/18/2014   Multiple sclerosis ([place]) 11/18/2014   Unsteady gait 11/18/2014   Dizziness 11/18/2014   Inconclusive mammogram 03/15/2013   S/P lumbar spinal fusion 01/04/2013   Surgery, other elective 12/03/2012   DDD (degenerative disc disease), lumbosacral 10/15/2012   Back ache 09/06/2012   Hypertension 09/06/2012   Back pain 09/06/2012   Hypercholesterolemia 01/28/2012   Climacteric 01/28/2012   Routine general medical examination at a health care facility 01/28/2012   Avitaminosis D 01/28/2012    Zollie Pee, PT 11/26/2021, 3:44 PM  [place] MAIN [facility] SERVICES [address] [place], [place], [address] Phone: [phone]   Fax:  [phone]  Name: Carly [name] MRN: [id number] Date of Birth: 30-Aug-1951

## 2021-11-26 NOTE — Telephone Encounter (Signed)
LMTCB

## 2021-11-26 NOTE — Telephone Encounter (Signed)
See other note

## 2021-11-26 NOTE — Telephone Encounter (Signed)
Pt returning call. Pt requesting callback.  °

## 2021-11-27 NOTE — Telephone Encounter (Signed)
Received fax from [facility] rehab requesting to transfer OT to their rehab center since she is established there for PT. Confirmed with patient this is correct. Form placed out for signature

## 2021-11-27 NOTE — Telephone Encounter (Signed)
Faxed

## 2021-11-27 NOTE — Telephone Encounter (Signed)
Signed and placed in box.   

## 2021-11-28 ENCOUNTER — Other Ambulatory Visit: Payer: Self-pay

## 2021-11-28 ENCOUNTER — Encounter: Payer: Self-pay | Admitting: Hematology and Oncology

## 2021-11-28 ENCOUNTER — Encounter (HOSPITAL_BASED_OUTPATIENT_CLINIC_OR_DEPARTMENT_OTHER): Payer: Medicare Other | Admitting: Psychology

## 2021-11-28 ENCOUNTER — Ambulatory Visit: Payer: Medicare Other

## 2021-11-28 DIAGNOSIS — F411 Generalized anxiety disorder: Secondary | ICD-10-CM | POA: Diagnosis not present

## 2021-11-28 DIAGNOSIS — F482 Pseudobulbar affect: Secondary | ICD-10-CM

## 2021-11-28 DIAGNOSIS — G4733 Obstructive sleep apnea (adult) (pediatric): Secondary | ICD-10-CM | POA: Diagnosis not present

## 2021-11-28 DIAGNOSIS — I890 Lymphedema, not elsewhere classified: Secondary | ICD-10-CM | POA: Diagnosis not present

## 2021-11-28 DIAGNOSIS — Z8673 Personal history of transient ischemic attack (TIA), and cerebral infarction without residual deficits: Secondary | ICD-10-CM

## 2021-11-28 NOTE — Telephone Encounter (Signed)
Pt called in requesting for her 12/13/2021 appt time to be change to 12:15pm. Advise Pt there is no avail appt. Pt stated that she maybe 5 to 15 mins late for appt.

## 2021-11-28 NOTE — Telephone Encounter (Signed)
Spoke with patient. She is going to have her ears flushed at [facility] on 3/3 at 10:30. Patient was just wanting to let me know that she is going to try to make it back home in time to do her appt with Dr Nicki Reaper.. She is doing a virtual. Patient does not want to cancel.

## 2021-11-29 ENCOUNTER — Ambulatory Visit: Payer: Medicare Other

## 2021-11-29 ENCOUNTER — Other Ambulatory Visit: Payer: Self-pay | Admitting: Internal Medicine

## 2021-11-29 DIAGNOSIS — I890 Lymphedema, not elsewhere classified: Secondary | ICD-10-CM | POA: Diagnosis not present

## 2021-11-29 DIAGNOSIS — R482 Apraxia: Secondary | ICD-10-CM | POA: Diagnosis not present

## 2021-11-29 DIAGNOSIS — R2689 Other abnormalities of gait and mobility: Secondary | ICD-10-CM | POA: Diagnosis not present

## 2021-11-29 DIAGNOSIS — M6281 Muscle weakness (generalized): Secondary | ICD-10-CM | POA: Diagnosis not present

## 2021-11-29 DIAGNOSIS — R278 Other lack of coordination: Secondary | ICD-10-CM

## 2021-11-29 DIAGNOSIS — R2681 Unsteadiness on feet: Secondary | ICD-10-CM | POA: Diagnosis not present

## 2021-11-29 NOTE — Therapy (Signed)
[name] [address] [place], [place], [address] Phone: [phone]   Fax:  [phone]  Physical Therapy Treatment  Patient Details  Name: Carly [name] MRN: [id number] Date of Birth: [birth year] Referring Provider (PT): Dr. Einar Pheasant for wheelchair eval for 11/12/2021   Encounter Date: 11/29/2021   PT End of Session - 11/29/21 1242     Visit Number 21    Number of Visits 24    Date for PT Re-Evaluation 01/23/22    Authorization Time Period 08/08/21-10/31/20; Recert 3/50/0938- 1/82/9937    Progress Note Due on Visit 20    PT Start Time 1050    PT Stop Time 1132    PT Time Calculation (min) 42 min    Equipment Utilized During Treatment Gait belt    Activity Tolerance Patient tolerated treatment well    Behavior During Therapy WFL for tasks assessed/performed             Past Medical History:  Diagnosis Date   Allergy    Anxiety    Aspiration pneumonia ([place])    Depression    Frequent headaches    H/O   GERD (gastroesophageal reflux disease)    History of chicken pox    History of colon polyps    Hx of migraines    Multiple sclerosis ([place]) 2011   OSA (obstructive sleep apnea)    PONV (postoperative nausea and vomiting)     Past Surgical History:  Procedure Laterality Date   BACK SURGERY     CHOLECYSTECTOMY     COLONOSCOPY WITH PROPOFOL N/A 05/18/2017   Procedure: COLONOSCOPY WITH PROPOFOL;  Surgeon: [name], MD;  Location[facility];  Service: Endoscopy;  Laterality: N/A;   FOOT SURGERY  2015   GALLBLADDER SURGERY  2008   HARDWARE REMOVAL Left 02/14/2016   Procedure: LEFT FOOT REMOVAL DEEP IMPLANT;  Surgeon: [name], MD;  Location: [facility];  Service: Orthopedics;  Laterality: Left;   HERNIA REPAIR     Inguinal Hernia Repair   SPINE SURGERY  2014    There were no vitals filed for this visit.   Subjective Assessment - 11/29/21 1052     Subjective Pt  reports no aches/pain. Pt reports she is upset because she forgot her glassess. No LOB. Pt reports she is between caregivers so she hasn't been able to do her HEP.    Patient is accompained by: Family member    Pertinent History Patient is a 71 year old female referral for power wheelchair evaluation from Dr. Einar Pheasant. 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  Patient reports her current manual w/c is insufficient for her needs- She states she is unable to manuever independently. She reports multiple falls (3) in past 6 months.    Limitations Standing;Walking    How long can you sit comfortably? no issues    How long can you stand comfortably? 15 min.    How long can you walk comfortably? 10 min.    Patient Stated Goals Standing, walking, transfers    Currently in Pain? No/denies    Pain Onset More than a month ago   leg pain and swelling first noticed during long care trip ~ 7 hours           INTERVENTIONS:       Exercise/Activity Sets/Reps/Time/ Resistance Assistance Charge type Comments  Seated AROM hip march  2 sets of 10 reps each LE with 2.5# donned each LE VC for technique  Therex Continued cues for increased amplitude of movement   LAQ with 2.5# weights donned each LE 2x20 each LE   TherEx Pt improves technique with VC/TC for increased amplitude and speed of movement  Soccer ball kicks into cones with 2.5# ankle weights X several minutes to encourage L LE power  VC/demo TherEx Pt exhibits improvement in LE power compared to previous sessions.  STS 2x10, 1x4 CGA TherEx VC/TC for UE positioning/technique  Bed mobility X several minutes Min assist There Act. PT instructed pt in bridging, scooting, and rolling techniques. Pt performed multiple reps of each.              Education provided throughout session via VC/TC and demonstration to facilitate movement at target joints  and correct muscle activation for all testing and exercises performed.       PT Education - 11/29/21 1241     Education provided Yes    Education Details exercise technique, rolling/bed mobility    Person(s) Educated Patient    Methods Explanation;Demonstration;Tactile cues;Verbal cues    Comprehension Verbalized understanding;Returned demonstration;Verbal cues required;Tactile cues required;Need further instruction              PT Short Term Goals - 11/26/21 1530       PT SHORT TERM GOAL #1   Title Patient will be independent in home exercise program to improve strength/mobility for better functional independence with ADLs.    Baseline Pt does not have HEP; 09/25/2021- Patient reports compliant wth HEP consisting of LE strengthening.    Time 4    Period Weeks    Status Achieved    Target Date 09/05/21      PT SHORT TERM GOAL #2   Title Patient will increase FOTO score to equal to or greater than 4    to demonstrate statistically significant improvement in mobility and quality of life.    Baseline 28; 09/25/2021- will reassess next visit; 10/31/2021= 24; 2/14: 39    Time 6    Period Weeks    Status Achieved    Target Date 12/12/21      PT SHORT TERM GOAL #3   Title Pt and caregivers will understand PT recommendation and appropriate/safe use for wheelchair and seating for home use.    Baseline 11/12/2021- Patient is currently limited to manual w/c and difficulty with transfers and dependent on caregiver for navigation.; 2/14: Pt recently had WC eval. Pt verbalizes use of correct home equipment.    Time 1    Period Days    Status Achieved    Target Date 11/12/21               PT Long Term Goals - 11/26/21 1110       PT LONG TERM GOAL #1   Title Patient will be independent with advanced and progressive home program for strength and endurance in order to transition to self management    Baseline Pt does not have current formal HEP; 09/25/2021- Patient does report  compliance with initial HEP consisting on seated therex for LE strengthening. 10/31/2021= Patient is performing initial HEP with seated LE strengthening but will benefit from progressive strengthening including standing/walking/balance; 2/14: pt not consistently performing her current HEP    Time 12    Period Weeks    Status On-going    Target Date 01/23/22      PT  LONG TERM GOAL #2   Title pt's FOTO score will improve by 4 points or more indicating improved confidence with daily tasks at home    Baseline 28 at intake: 09/25/2021- Will assess next visit; 10/31/2021= 24; 2/14: 39    Time 12    Period Weeks    Status Achieved    Target Date 01/23/22      PT LONG TERM GOAL #3   Title Patient will deny any falls over past 6 weeks to demonstrate improved safety awareness at home and work.    Baseline Pt reports 3 falls in previous 6 months; 09/25/2021= No falls reported and patient has been focusing on walking  some in clinic.    Time 6    Period Weeks    Status Achieved      PT LONG TERM GOAL #4   Title Patient will perform sit to and from stand from her wheelchair with min assist and no upper extremity support in order to indicate improvement in function.    Baseline Patient requires mod assist from author and bilateral lower extremity support to transition to and from sitting. 09/25/2021- Patient continues to require repetitive VC, TC, and min physical assistance to stand as wel as B UE Support.  10/31/2021= Patient continues to present with LE weakness- requires BUE support on armrest as well as min physical assist to stand.; 2/14: Pt requires BUE support of of chair armrests and CGA from PT to complete STS    Time 12    Period Weeks    Status On-going    Target Date 01/23/22      PT LONG TERM GOAL #5   Title Pt will increase 10MWT by at least 0.13 m/s in order to demonstrate clinically significant improvement in community ambulation.    Baseline 09/25/2021=10 MWT= 0.045 m/s using front  wheeled walker. 10/31/2021= 0.056 m/s using front wheeled walker; 2/14: 0.06 m/s with front wheeled walker and CGA    Time 12    Period Weeks    Status On-going    Target Date 01/23/22      PT LONG TERM GOAL #6   Title Patient will ambulate on level surfaces using front wheeled walker with CGA > 200 feet without rest break or loss of balance for improved ability for household ambulation.    Baseline 09/25/2021= Patient was able to walk 32 feet today with front wheeled walker, Min assist with close w/c follow with decreased heel to toe and decreased step length. 10/31/2021- Patient able to walk approx 45 feet with front wheeled walker with minimal step length and dragging right LE at times; 2/14: pt ambulates 99m on this date and reports fatigue    Time 12    Period Weeks    Status On-going    Target Date 01/23/22                   Plan - 11/29/21 1242     Clinical Impression Statement Pt presented to session with excellent motivation. Pt performed STS at increased volume and rated intervention as easy, indicating improved BLE strength. PT provided instruction in bed mobility on this date. Pt with difficulty bridging and rolling B, requiring up to min a. The pt will benefit from further skilled PT to improve strength, endurance, mobility and balance to increase QOL and decrease fall risk.    Personal Factors and Comorbidities Age;Comorbidity 1;Comorbidity 2;Comorbidity 3+;Time since onset of injury/illness/exacerbation    Comorbidities Depression, anxiety, HTN, MS, DDD,  GERD, Lymphedema    Examination-Activity Limitations Bed Mobility;Carry;Continence;Dressing;Hygiene/Grooming;Lift;Locomotion Level;Squat;Stairs;Stand;Toileting;Transfers    Examination-Participation Restrictions Cleaning;Community Activity;Laundry;Meal Prep;Shop    Stability/Clinical Decision Making Evolving/Moderate complexity    Rehab Potential Fair    Clinical Impairments Affecting Rehab Potential (+) family support,  motivated (-) MS, decreased confidence, co morbidities    PT Frequency 2x / week    PT Duration 12 weeks    PT Treatment/Interventions Patient/family education;Gait training;Neuromuscular re-education;Therapeutic exercise;Manual techniques;ADLs/Self Care Home Management;Stair training;DME Instruction;Functional mobility training;Therapeutic activities;Balance training;Wheelchair mobility training;Energy conservation;Joint Manipulations;Cryotherapy;Electrical Stimulation;Moist Heat;Passive range of motion    PT Next Visit Plan gait training, LE  strength training, endurance training, continue POC as indicated    PT Home Exercise Plan Access Code: [email or web address]; no updates; 1/9: Access Code: [email or web address]; no updates    Consulted and Agree with Plan of Care Patient             Patient will benefit from skilled therapeutic intervention in order to improve the following deficits and impairments:  Decreased strength, Decreased balance, Decreased activity tolerance, Decreased endurance, Difficulty walking, Abnormal gait, Improper body mechanics, Decreased mobility, Hypomobility, Impaired flexibility, Impaired perceived functional ability, Decreased range of motion, Pain  Visit Diagnosis: Muscle weakness (generalized)  Other abnormalities of gait and mobility  Other lack of coordination  Unsteadiness on feet     Problem List Patient Active Problem List   Diagnosis Date Noted   Pre-op evaluation 08/31/2021   Rash 01/27/2021   History of CVA (cerebrovascular accident) 01/05/2021   PBA (pseudobulbar affect)    MS (multiple sclerosis) (HCC)    Anxiety and depression    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   Generalized anxiety disorder 12/11/2020   Acute focal neurological deficit 12/08/2020   Edema 09/28/2020   Bloating 08/25/2020   Environmental allergies 07/17/2019   Hyperglycemia 07/17/2019   Cough 06/06/2019   Memory change 05/29/2019   Abnormal liver function tests  01/09/2019   Hemorrhoid 09/27/2018   Nausea 08/11/2018   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   Depression with anxiety 02/04/2018   Cognitive and behavioral changes 02/04/2018   Constipation 11/17/2017   Hx of adenomatous colonic polyps 06/06/2016   Lower extremity edema 01/13/2016   Bilateral ovarian cysts 10/16/2015   Health care maintenance 10/16/2015   Colon polyp 01/25/2015   Bone/cartilage disorder 01/25/2015   Headache 11/18/2014   Depression, recurrent ([place]) 11/18/2014   Greater tuberosity of humerus fracture 11/18/2014   Multiple sclerosis ([place]) 11/18/2014   Unsteady gait 11/18/2014   Dizziness 11/18/2014   Inconclusive mammogram 03/15/2013   S/P lumbar spinal fusion 01/04/2013   Surgery, other elective 12/03/2012   DDD (degenerative disc disease), lumbosacral 10/15/2012   Back ache 09/06/2012   Hypertension 09/06/2012   Back pain 09/06/2012   Hypercholesterolemia 01/28/2012   Climacteric 01/28/2012   Routine general medical examination at a health care facility 01/28/2012   Avitaminosis D 01/28/2012    Zollie Pee, PT 11/29/2021, 12:48 PM  [place] [facility] [address] [place], [place], [address] Phone: [phone]   Fax:  [phone]  Name: TIERRA THOMA MRN: [id number] Date of Birth: 01-21-1951

## 2021-12-02 ENCOUNTER — Ambulatory Visit: Payer: Medicare Other

## 2021-12-04 ENCOUNTER — Other Ambulatory Visit: Payer: Self-pay

## 2021-12-04 ENCOUNTER — Ambulatory Visit: Payer: Medicare Other

## 2021-12-04 DIAGNOSIS — I890 Lymphedema, not elsewhere classified: Secondary | ICD-10-CM | POA: Diagnosis not present

## 2021-12-04 DIAGNOSIS — R2681 Unsteadiness on feet: Secondary | ICD-10-CM | POA: Diagnosis not present

## 2021-12-04 DIAGNOSIS — R2689 Other abnormalities of gait and mobility: Secondary | ICD-10-CM | POA: Diagnosis not present

## 2021-12-04 DIAGNOSIS — M6281 Muscle weakness (generalized): Secondary | ICD-10-CM | POA: Diagnosis not present

## 2021-12-04 DIAGNOSIS — R278 Other lack of coordination: Secondary | ICD-10-CM | POA: Diagnosis not present

## 2021-12-04 DIAGNOSIS — R482 Apraxia: Secondary | ICD-10-CM | POA: Diagnosis not present

## 2021-12-04 NOTE — Therapy (Signed)
[place] MAIN [facility] SERVICES [address] [place], [place], [address] Phone: [phone]   Fax:  [phone]  Physical Therapy Treatment  Patient Details  Name: Carly [name] MRN: [id number] Date of Birth: 1951/02/08 Referring Provider (PT): Dr. Einar Pheasant for wheelchair eval for 11/12/2021   Encounter Date: 12/04/2021   PT End of Session - 12/04/21 1722     Visit Number 22    Number of Visits 24    Date for PT Re-Evaluation 01/23/22    Authorization Time Period 08/08/21-10/31/20; Recert 5/70/1779- 3/90/3009    Progress Note Due on Visit 20    PT Start Time 1432    PT Stop Time 1514    PT Time Calculation (min) 42 min    Equipment Utilized During Treatment Gait belt    Activity Tolerance Patient tolerated treatment well    Behavior During Therapy WFL for tasks assessed/performed             Past Medical History:  Diagnosis Date   Allergy    Anxiety    Aspiration pneumonia ([place])    Depression    Frequent headaches    H/O   GERD (gastroesophageal reflux disease)    History of chicken pox    History of colon polyps    Hx of migraines    Multiple sclerosis ([place]) 2011   OSA (obstructive sleep apnea)    PONV (postoperative nausea and vomiting)     Past Surgical History:  Procedure Laterality Date   BACK SURGERY     CHOLECYSTECTOMY     COLONOSCOPY WITH PROPOFOL N/A 05/18/2017   Procedure: COLONOSCOPY WITH PROPOFOL;  Surgeon: [name], MD;  Location: [facility] ENDOSCOPY;  Service: Endoscopy;  Laterality: N/A;   FOOT SURGERY  2015   GALLBLADDER SURGERY  2008   HARDWARE REMOVAL Left 02/14/2016   Procedure: LEFT FOOT REMOVAL DEEP IMPLANT;  Surgeon: [name], MD;  Location: [facility];  Service: Orthopedics;  Laterality: Left;   HERNIA REPAIR     Inguinal Hernia Repair   SPINE SURGERY  2014    There were no vitals filed for this visit.   Subjective Assessment - 12/04/21 1721     Subjective Pt  with new caregiver. The pt reports no pain at present. She is still stressed due to recent move.    Patient is accompained by: Family member    Pertinent History Patient is a 71 year old female referral for power wheelchair evaluation from Dr. Einar Pheasant. 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  Patient reports her current manual w/c is insufficient for her needs- She states she is unable to manuever independently. She reports multiple falls (3) in past 6 months.    Limitations Standing;Walking    How long can you sit comfortably? no issues    How long can you stand comfortably? 15 min.    How long can you walk comfortably? 10 min.    Patient Stated Goals Standing, walking, transfers    Currently in Pain? No/denies    Pain Onset More than a month ago   leg pain and swelling first noticed during long care trip ~ 7 hours              INTERVENTIONS:       Exercise/Activity Sets/Reps/Time/ Resistance Assistance Charge type Comments  GTB seated march  3x20 alteranting LEs VC/TC/demo for  technique  Therex Pt requires more frequent TC to LLE for improved amplitude of movement/ROM  GTB hip abduction 2x20 B VC/TC/Demo TherEx   Therapy ball hip adductor squeezes 3x10 with 2 sec holds  VC/demo/TC TherEx Pt difficulty sustaining contraction to prevent dropping therapy ball without PT assistance  STS 3x5 (pt pulling to stand) CGA TherEx VC/TC for UE/LE positioning/technique  Chair dips 2x5  CGA TherEx Pt performs through limited ROM. TC/VC provided   Heel raises with 3# weights donned 2x20 B   Therex Cuing for amplitude, TC/VC/Demo  Seated DF 3# weights donned 3x10, difficulty with LLE (PT provides assist) Min a Therex PT provides assist to LLE for improved ROM/technique/amplitude of movement  LAQ with 3# weights donned 1x10 each LE  Therex Pt cued to decrease speed of movement/improve control     Reviewed HEP with pt and caregiver: Access Code: [email or web address] URL: [email or web address] Date: 12/04/2021 Prepared by: Ricard Dillon  Exercises Seated Hip Abduction with Resistance - 1 x daily - 5 x weekly - 3 sets - 10 reps Seated Hip Adduction Isometrics with Ball - 1 x daily - 5 x weekly - 3 sets - 10 reps - 3 hold Seated March - 1 x daily - 5 x weekly - 3 sets - 10 reps Sit to Stand with Counter Support - 1 x daily - 5 x weekly - 3 sets - 5 reps       Education provided throughout session via VC/TC and demonstration to facilitate movement at target joints and correct muscle activation for all testing and exercises performed.      PT Education - 12/04/21 1722     Education provided Yes    Education Details exercise technique, HEP    Person(s) Educated Patient;Caregiver(s)    Methods Explanation;Handout;Demonstration;Tactile cues;Verbal cues    Comprehension Returned demonstration;Verbalized understanding;Verbal cues required;Tactile cues required;Need further instruction              PT Short Term Goals - 11/26/21 1530       PT SHORT TERM GOAL #1   Title Patient will be independent in home exercise program to improve strength/mobility for better functional independence with ADLs.    Baseline Pt does not have HEP; 09/25/2021- Patient reports compliant wth HEP consisting of LE strengthening.    Time 4    Period Weeks    Status Achieved    Target Date 09/05/21      PT SHORT TERM GOAL #2   Title Patient will increase FOTO score to equal to or greater than 4    to demonstrate statistically significant improvement in mobility and quality of life.    Baseline 28; 09/25/2021- will reassess next visit; 10/31/2021= 24; 2/14: 39    Time 6    Period Weeks    Status Achieved    Target Date 12/12/21      PT SHORT TERM GOAL #3   Title Pt and caregivers will understand PT recommendation and appropriate/safe use for wheelchair and seating for home use.    Baseline  11/12/2021- Patient is currently limited to manual w/c and difficulty with transfers and dependent on caregiver for navigation.; 2/14: Pt recently had WC eval. Pt verbalizes use of correct home equipment.    Time 1    Period Days    Status Achieved    Target Date 11/12/21               PT Long Term Goals - 11/26/21 1110  PT LONG TERM GOAL #1   Title Patient will be independent with advanced and progressive home program for strength and endurance in order to transition to self management    Baseline Pt does not have current formal HEP; 09/25/2021- Patient does report compliance with initial HEP consisting on seated therex for LE strengthening. 10/31/2021= Patient is performing initial HEP with seated LE strengthening but will benefit from progressive strengthening including standing/walking/balance; 2/14: pt not consistently performing her current HEP    Time 12    Period Weeks    Status On-going    Target Date 01/23/22      PT LONG TERM GOAL #2   Title pt's FOTO score will improve by 4 points or more indicating improved confidence with daily tasks at home    Baseline 28 at intake: 09/25/2021- Will assess next visit; 10/31/2021= 24; 2/14: 39    Time 12    Period Weeks    Status Achieved    Target Date 01/23/22      PT LONG TERM GOAL #3   Title Patient will deny any falls over past 6 weeks to demonstrate improved safety awareness at home and work.    Baseline Pt reports 3 falls in previous 6 months; 09/25/2021= No falls reported and patient has been focusing on walking  some in clinic.    Time 6    Period Weeks    Status Achieved      PT LONG TERM GOAL #4   Title Patient will perform sit to and from stand from her wheelchair with min assist and no upper extremity support in order to indicate improvement in function.    Baseline Patient requires mod assist from author and bilateral lower extremity support to transition to and from sitting. 09/25/2021- Patient continues to  require repetitive VC, TC, and min physical assistance to stand as wel as B UE Support.  10/31/2021= Patient continues to present with LE weakness- requires BUE support on armrest as well as min physical assist to stand.; 2/14: Pt requires BUE support of of chair armrests and CGA from PT to complete STS    Time 12    Period Weeks    Status On-going    Target Date 01/23/22      PT LONG TERM GOAL #5   Title Pt will increase 10MWT by at least 0.13 m/s in order to demonstrate clinically significant improvement in community ambulation.    Baseline 09/25/2021=10 MWT= 0.045 m/s using front wheeled walker. 10/31/2021= 0.056 m/s using front wheeled walker; 2/14: 0.06 m/s with front wheeled walker and CGA    Time 12    Period Weeks    Status On-going    Target Date 01/23/22      PT LONG TERM GOAL #6   Title Patient will ambulate on level surfaces using front wheeled walker with CGA > 200 feet without rest break or loss of balance for improved ability for household ambulation.    Baseline 09/25/2021= Patient was able to walk 32 feet today with front wheeled walker, Min assist with close w/c follow with decreased heel to toe and decreased step length. 10/31/2021- Patient able to walk approx 45 feet with front wheeled walker with minimal step length and dragging right LE at times; 2/14: pt ambulates 25m on this date and reports fatigue    Time 12    Period Weeks    Status On-going    Target Date 01/23/22  Plan - 12/04/21 1723     Clinical Impression Statement Pt presents with new caregiver to session. PT dedicated first part of session to reviewing HEP with pt and her caregiver for improved carryover between sessions and to increase gains made in PT. Both verbalized understanding and pt was able to return correct demo within session, but will likely require further instruction. Pt otherwise tolerated all interventions well without excessive fatigue and without pain. She was most  challenged with LLE heel raises (seated) and WC push-ups. The pt will benefit from further skilled PT to increase LE strength, mobility and balance to increase QOL and decrease fall risk.    Personal Factors and Comorbidities Age;Comorbidity 1;Comorbidity 2;Comorbidity 3+;Time since onset of injury/illness/exacerbation    Comorbidities Depression, anxiety, HTN, MS, DDD, GERD, Lymphedema    Examination-Activity Limitations Bed Mobility;Carry;Continence;Dressing;Hygiene/Grooming;Lift;Locomotion Level;Squat;Stairs;Stand;Toileting;Transfers    Examination-Participation Restrictions Cleaning;Community Activity;Laundry;Meal Prep;Shop    Stability/Clinical Decision Making Evolving/Moderate complexity    Rehab Potential Fair    Clinical Impairments Affecting Rehab Potential (+) family support, motivated (-) MS, decreased confidence, co morbidities    PT Frequency 2x / week    PT Duration 12 weeks    PT Treatment/Interventions Patient/family education;Gait training;Neuromuscular re-education;Therapeutic exercise;Manual techniques;ADLs/Self Care Home Management;Stair training;DME Instruction;Functional mobility training;Therapeutic activities;Balance training;Wheelchair mobility training;Energy conservation;Joint Manipulations;Cryotherapy;Electrical Stimulation;Moist Heat;Passive range of motion    PT Next Visit Plan gait training, LE  strength training, endurance training, continue POC as indicated    PT Home Exercise Plan Access Code: [email or web address]; no updates; 1/9: Access Code: [email or web address]; no updates    Consulted and Agree with Plan of Care Patient             Patient will benefit from skilled therapeutic intervention in order to improve the following deficits and impairments:  Decreased strength, Decreased balance, Decreased activity tolerance, Decreased endurance, Difficulty walking, Abnormal gait, Improper body mechanics, Decreased mobility, Hypomobility, Impaired flexibility, Impaired perceived functional  ability, Decreased range of motion, Pain  Visit Diagnosis: Muscle weakness (generalized)  Other abnormalities of gait and mobility  Unsteadiness on feet  Other lack of coordination     Problem List Patient Active Problem List   Diagnosis Date Noted   Pre-op evaluation 08/31/2021   Rash 01/27/2021   History of CVA (cerebrovascular accident) 01/05/2021   PBA (pseudobulbar affect)    MS (multiple sclerosis) (HCC)    Anxiety and depression    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   Generalized anxiety disorder 12/11/2020   Acute focal neurological deficit 12/08/2020   Edema 09/28/2020   Bloating 08/25/2020   Environmental allergies 07/17/2019   Hyperglycemia 07/17/2019   Cough 06/06/2019   Memory change 05/29/2019   Abnormal liver function tests 01/09/2019   Hemorrhoid 09/27/2018   Nausea 08/11/2018   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   Depression with anxiety 02/04/2018   Cognitive and behavioral changes 02/04/2018   Constipation 11/17/2017   Hx of adenomatous colonic polyps 06/06/2016   Lower extremity edema 01/13/2016   Bilateral ovarian cysts 10/16/2015   Health care maintenance 10/16/2015   Colon polyp 01/25/2015   Bone/cartilage disorder 01/25/2015   Headache 11/18/2014   Depression, recurrent ([place]) 11/18/2014   Greater tuberosity of humerus fracture 11/18/2014   Multiple sclerosis ([place]) 11/18/2014   Unsteady gait 11/18/2014   Dizziness 11/18/2014   Inconclusive mammogram 03/15/2013   S/P lumbar spinal fusion 01/04/2013   Surgery, other elective 12/03/2012   DDD (degenerative disc disease), lumbosacral 10/15/2012   Back ache 09/06/2012   Hypertension 09/06/2012   Back  pain 09/06/2012   Hypercholesterolemia 01/28/2012   Climacteric 01/28/2012   Routine general medical examination at a health care facility 01/28/2012   Avitaminosis D 01/28/2012    Zollie Pee, PT 12/04/2021, 5:31 PM  [place]  MAIN [facility] SERVICES [address] [place], [place], [address] Phone: [phone]   Fax:  [phone]  Name: Carly [name] MRN: [id number] Date of Birth: [birth year]

## 2021-12-04 NOTE — Telephone Encounter (Signed)
Raquel Sarna from [employer] called for a request for office notes for a wheelchair 336 [phone] fax number

## 2021-12-05 ENCOUNTER — Ambulatory Visit: Payer: Medicare Other

## 2021-12-05 DIAGNOSIS — I890 Lymphedema, not elsewhere classified: Secondary | ICD-10-CM | POA: Diagnosis not present

## 2021-12-05 DIAGNOSIS — R2689 Other abnormalities of gait and mobility: Secondary | ICD-10-CM | POA: Diagnosis not present

## 2021-12-05 DIAGNOSIS — R482 Apraxia: Secondary | ICD-10-CM

## 2021-12-05 DIAGNOSIS — R2681 Unsteadiness on feet: Secondary | ICD-10-CM | POA: Diagnosis not present

## 2021-12-05 DIAGNOSIS — R278 Other lack of coordination: Secondary | ICD-10-CM | POA: Diagnosis not present

## 2021-12-05 DIAGNOSIS — M6281 Muscle weakness (generalized): Secondary | ICD-10-CM | POA: Diagnosis not present

## 2021-12-05 NOTE — Telephone Encounter (Signed)
Pt called in stating that [employer] didn't received the paperwork. Pt is requesting for paperwork to be faxed over as soon as possible. Pt stated she needs her wheelchair fix. Pt requesting callback.

## 2021-12-06 NOTE — Telephone Encounter (Signed)
Advised that Dr Lars Mage office note on 3/3 is needed prior to sending because there has to be documentation in note for need for wheel chair. Will call [employer] to confirm.

## 2021-12-06 NOTE — Therapy (Signed)
[place] MAIN [facility] SERVICES [address] [place], [place], [address] Phone: [phone]   Fax:  [phone]  Occupational Therapy Evaluation  Patient Details  Name: Carly [name] MRN: [id number] Date of Birth: 01-Sep-1951 Referring Provider (OT): Dr. Einar Pheasant   Encounter Date: 12/05/2021   OT End of Session - 12/06/21 1449     Visit Number 1    Number of Visits 24    Date for OT Re-Evaluation 02/26/22    OT Start Time 1522    OT Stop Time 1620    OT Time Calculation (min) 58 min    Activity Tolerance Patient tolerated treatment well    Behavior During Therapy [facility] for tasks assessed/performed             Past Medical History:  Diagnosis Date   Allergy    Anxiety    Aspiration pneumonia ([place])    Depression    Frequent headaches    H/O   GERD (gastroesophageal reflux disease)    History of chicken pox    History of colon polyps    Hx of migraines    Multiple sclerosis ([place]) 2011   OSA (obstructive sleep apnea)    PONV (postoperative nausea and vomiting)     Past Surgical History:  Procedure Laterality Date   BACK SURGERY     CHOLECYSTECTOMY     COLONOSCOPY WITH PROPOFOL N/A 05/18/2017   Procedure: COLONOSCOPY WITH PROPOFOL;  Surgeon: [name], MD;  Location: [facility] ENDOSCOPY;  Service: Endoscopy;  Laterality: N/A;   FOOT SURGERY  2015   GALLBLADDER SURGERY  2008   HARDWARE REMOVAL Left 02/14/2016   Procedure: LEFT FOOT REMOVAL DEEP IMPLANT;  Surgeon: [name], MD;  Location: [place];  Service: Orthopedics;  Laterality: Left;   HERNIA REPAIR     Inguinal Hernia Repair   SPINE SURGERY  2014    There were no vitals filed for this visit.   Subjective Assessment - 12/06/21 1211     Subjective  "I can't write like I did.  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 No/denies    Pain Score 0-No pain               [facility] OT Assessment - 12/06/21 0001       AROM   Overall AROM Comments R shoulder flex 115 (L 120); R ER 73, L WNL      Strength   Overall Strength Comments R wrist flex/ext 4- (L 4+)      Hand Function   Right Hand Grip (lbs) 24    Left Hand Grip (lbs) 31            Occupational Therapy Evaluation: Pt is a 71 y/o female present today to address R hand weakness and coordination post CVA in July of '22.  Pt is familiar to [facility] outpatient clinic as she has received OT for LE lymphedema management, and she is currently under PT poc for mobility deficits related to her MS.  Pt reports her primary concerns now for OT are difficulty managing her hair, writing, and using her phone  with her R dominant hand.  Pt had been working with an OT at another facility, but chose to transfer OT order here to [facility] d/t already receiving PT at this clinic.  Pt reports some extra stress and anxiety with her recent move to [employer] community with her spouse, so she reports limited engagement in her hand exercises which she was working on at the other facility.  OT encouraged pt to set aside 5-10 min 1-2 x per day to restart her theraputty and handwriting exercises in order to meet her functional goals.  Pt verbalized understanding/receptive.  Pt will benefit from skilled OT to address RUE weakness and lack of coordination as noted above.  Pt in agreement with OT poc.   Self Care: Issued handwriting exercises and pen grip.  Encouraged pt to use lined paper at home to help with consistency of her letter height and level writing.  Practiced with pen grip to print and sign name.  Encouraged pt to pick 2 exercises daily  from her handwriting exercise page issued today.  Pt verbalized understanding.     OT Education - 12/05/21 1448     Education Details OT goals, poc, HEP initiated for handwriting    Person(s) Educated Patient;Caregiver(s)    Methods Explanation;Demonstration    Comprehension Verbalized understanding;Returned demonstration;Need further instruction;Verbal cues required              OT Short Term Goals - 12/06/21 1502       OT SHORT TERM GOAL #1   Title Pt will perform HEP for R hand strengthening and coordination with min vc.    Baseline Eval: initaited at eval, further training needed    Time 6    Period Weeks    Status New    Target Date 01/15/22               OT Long Term Goals - 12/06/21 1503       OT LONG TERM GOAL #7   Title Pt will write (cursive) 2-3 sentences with >75% legibility.    Baseline Eval: R hand fatigues with cursive signature and is 75% legible, extra time needed    Time 12    Period Weeks    Status New    Target Date 02/26/22      OT LONG TERM GOAL #8   Title Pt will increase grip strength by 5 or more lbs to ease holding and carrying ADL supplies in R dominant hand.    Baseline Eval: R grip 24 (L 32, non dominant)    Time 12    Period Weeks    Status New    Target Date 02/26/22      OT LONG TERM GOAL  #9   TITLE Pt will increase R hand FMC to enable pt to send texts pressing buttons with R dominant hand and >75% accuracy.    Baseline Eval: pt has spouse respond to her text messages for her d/t poor accuracy with R dominant hand.    Time 12    Period Weeks    Status New    Target Date 02/26/22      OT LONG TERM GOAL  #10   TITLE Pt will be able to comb hair with R dominant hand.    Baseline Eval: combing hair is difficult; caregiver combs hair    Time 12    Period Weeks    Status New    Target Date 02/26/22  Plan - 12/06/21 1453     Clinical Impression Statement Pt is a 71 y/o female present today to address R  hand weakness and coordination post CVA in July of '22.  Pt is familiar to [facility] outpatient clinic as she has received OT for LE lymphedema management, and she is currently under PT poc for mobility deficits related to her MS.  Pt reports her primary concerns now for OT are difficulty managing her hair, writing, and using her phone with her R dominant hand.  ?Pt had been working with an OT at another facility, but chose to transfer OT order here to [facility] d/t already receiving PT at this clinic.  Pt reports some extra stress and anxiety with her recent move to [employer] community with her spouse, so she reports limited engagement in her hand exercises which she was working on at the other facility.  OT encouraged pt to set aside 5-10 min 1-2 x per day to restart her theraputty and handwriting exercises in order to meet her functional goals.  Pt verbalized understanding/receptive.  Pt will benefit from skilled OT to address RUE weakness and lack of coordination as noted above.  Pt in agreement with OT poc.    OT Occupational Profile and History Detailed Assessment- Review of Records and additional review of physical, cognitive, psychosocial history related to current functional performance    Occupational performance deficits (Please refer to evaluation for details): ADL's;IADL's;Leisure    Body Structure / Function / Physical Skills ADL;Coordination;GMC;UE functional use;Balance;Flexibility;IADL;FMC;Dexterity;Strength    Rehab Potential Fair    Clinical Decision Making Several treatment options, min-mod task modification necessary    Comorbidities Affecting Occupational Performance: Presence of comorbidities impacting occupational performance    Comorbidities impacting occupational performance description: MS, anxiety, depression    Modification or Assistance to Complete Evaluation  Min-Moderate modification of tasks or assist with assess necessary to complete eval    OT Frequency 2x / week    OT  Duration 12 weeks    OT Treatment/Interventions Self-care/ADL training;Therapeutic exercise;Coping strategies training;Neuromuscular education;Patient/family education;Therapeutic activities;DME and/or AE instruction;Manual Therapy    Plan OT to address R hand weakness and lack of coordination currently impacting writing, phone use, and basic ADL performance.    OT Home Exercise Plan Instructed in handwriting exercises; issued handout    Consulted and Agree with Plan of Care Patient             Patient will benefit from skilled therapeutic intervention in order to improve the following deficits and impairments:   Body Structure / Function / Physical Skills: ADL, Coordination, GMC, UE functional use, Balance, Flexibility, IADL, FMC, Dexterity, Strength       Visit Diagnosis: Apraxia  Muscle weakness (generalized)  Other lack of coordination    Problem List Patient Active Problem List   Diagnosis Date Noted   Pre-op evaluation 08/31/2021   Rash 01/27/2021   History of CVA (cerebrovascular accident) 01/05/2021   PBA (pseudobulbar affect)    MS (multiple sclerosis) ([place])    Anxiety and depression    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   Generalized anxiety disorder 12/11/2020   Acute focal neurological deficit 12/08/2020   Edema 09/28/2020   Bloating 08/25/2020   Environmental allergies 07/17/2019   Hyperglycemia 07/17/2019   Cough 06/06/2019   Memory change 05/29/2019   Abnormal liver function tests 01/09/2019   Hemorrhoid 09/27/2018   Nausea 08/11/2018   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   Depression with anxiety 02/04/2018   Cognitive and behavioral  changes 02/04/2018   Constipation 11/17/2017   Hx of adenomatous colonic polyps 06/06/2016   Lower extremity edema 01/13/2016   Bilateral ovarian cysts 10/16/2015   Health care maintenance 10/16/2015   Colon polyp 01/25/2015   Bone/cartilage disorder 01/25/2015   Headache 11/18/2014    Depression, recurrent ([place]) 11/18/2014   Greater tuberosity of humerus fracture 11/18/2014   Multiple sclerosis ([place]) 11/18/2014   Unsteady gait 11/18/2014   Dizziness 11/18/2014   Inconclusive mammogram 03/15/2013   S/P lumbar spinal fusion 01/04/2013   Surgery, other elective 12/03/2012   DDD (degenerative disc disease), lumbosacral 10/15/2012   Back ache 09/06/2012   Hypertension 09/06/2012   Back pain 09/06/2012   Hypercholesterolemia 01/28/2012   Climacteric 01/28/2012   Routine general medical examination at a health care facility 01/28/2012   Avitaminosis D 01/28/2012   Leta Speller, MS, OTR/L  Darleene Cleaver, OT 12/06/2021, 3:30 PM  [place] MAIN [facility] SERVICES [address] [place], [place], [address] Phone: [phone]   Fax:  [phone]  Name: Carly [name] MRN: [id number] Date of Birth: Nov 12, 1950

## 2021-12-10 ENCOUNTER — Encounter: Payer: Self-pay | Admitting: Occupational Therapy

## 2021-12-10 ENCOUNTER — Ambulatory Visit: Payer: Medicare Other | Admitting: Occupational Therapy

## 2021-12-10 ENCOUNTER — Ambulatory Visit: Payer: Medicare Other

## 2021-12-10 ENCOUNTER — Other Ambulatory Visit: Payer: Self-pay

## 2021-12-10 ENCOUNTER — Ambulatory Visit: Payer: Medicare Other | Attending: Specialist

## 2021-12-10 DIAGNOSIS — M6281 Muscle weakness (generalized): Secondary | ICD-10-CM

## 2021-12-10 DIAGNOSIS — R278 Other lack of coordination: Secondary | ICD-10-CM

## 2021-12-10 DIAGNOSIS — I890 Lymphedema, not elsewhere classified: Secondary | ICD-10-CM | POA: Diagnosis not present

## 2021-12-10 DIAGNOSIS — R482 Apraxia: Secondary | ICD-10-CM | POA: Diagnosis not present

## 2021-12-10 DIAGNOSIS — R2681 Unsteadiness on feet: Secondary | ICD-10-CM

## 2021-12-10 DIAGNOSIS — G4731 Primary central sleep apnea: Secondary | ICD-10-CM | POA: Diagnosis present

## 2021-12-10 DIAGNOSIS — R2689 Other abnormalities of gait and mobility: Secondary | ICD-10-CM | POA: Diagnosis not present

## 2021-12-10 DIAGNOSIS — G4733 Obstructive sleep apnea (adult) (pediatric): Secondary | ICD-10-CM | POA: Insufficient documentation

## 2021-12-10 NOTE — Therapy (Signed)
[place] MAIN [facility] SERVICES [address] [place], [place], [address] Phone: [phone]   Fax:  [phone]  Physical Therapy Treatment  Patient Details  Name: Carly [name] MRN: [id number] Date of Birth: 1951/03/07 Referring Provider (PT): Dr. Einar Pheasant for wheelchair eval for 11/12/2021   Encounter Date: 12/10/2021   PT End of Session - 12/10/21 1152     Visit Number 23    Number of Visits 24    Date for PT Re-Evaluation 01/23/22    Authorization Time Period 08/08/21-10/31/20; Recert 1/94/1740- 05/26/4817    Progress Note Due on Visit 20    PT Start Time 1151    PT Stop Time 1222    PT Time Calculation (min) 31 min    Equipment Utilized During Treatment Gait belt    Activity Tolerance Patient tolerated treatment well;Patient limited by fatigue    Behavior During Therapy WFL for tasks assessed/performed             Past Medical History:  Diagnosis Date   Allergy    Anxiety    Aspiration pneumonia ([place])    Depression    Frequent headaches    H/O   GERD (gastroesophageal reflux disease)    History of chicken pox    History of colon polyps    Hx of migraines    Multiple sclerosis ([place]) 2011   OSA (obstructive sleep apnea)    PONV (postoperative nausea and vomiting)     Past Surgical History:  Procedure Laterality Date   BACK SURGERY     CHOLECYSTECTOMY     COLONOSCOPY WITH PROPOFOL N/A 05/18/2017   Procedure: COLONOSCOPY WITH PROPOFOL;  Surgeon: [name], MD;  Location: [facility] ENDOSCOPY;  Service: Endoscopy;  Laterality: N/A;   FOOT SURGERY  2015   GALLBLADDER SURGERY  2008   HARDWARE REMOVAL Left 02/14/2016   Procedure: LEFT FOOT REMOVAL DEEP IMPLANT;  Surgeon: [name], MD;  Location: [place];  Service: Orthopedics;  Laterality: Left;   HERNIA REPAIR     Inguinal Hernia Repair   SPINE SURGERY  2014    There were no vitals filed for this visit.   Subjective Assessment - 12/10/21  1151     Subjective Pt reports some hand and neck pain currently due to attempting to write and trying "so hard" to do so. Pt reports no falls/stumbles. Pt reports performing HEP.    Patient is accompained by: Family member    Pertinent History Patient is a 71 year old female referral for power wheelchair evaluation from Dr. Einar Pheasant. 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  Patient reports her current manual w/c is insufficient for her needs- She states she is unable to manuever independently. She reports multiple falls (3) in past 6 months.    Limitations Standing;Walking    How long can you sit comfortably? no issues    How long can you stand comfortably? 15 min.    How long can you walk comfortably? 10 min.    Patient Stated Goals Standing, walking, transfers    Currently in Pain? Yes    Pain Location Hand   with some neck pain due to trying to write   Pain Orientation Right;Left    Pain Onset More than a month ago   leg pain and swelling first noticed during long care trip ~ 7 hours  INTERVENTIONS:       Exercise/Activity Sets/Reps/Time/ Resistance Assistance Charge type Comments  STS 3x5 (pushing on armrests to stand), last rep sustained standing for 45 sec with lateral weight shifts. 1x5 pulling to stand CGA-min a TherEx VC/TC for UE/LE positioning/technique. Pt exhibits heavy BUE weightbearing.    Heel raises with 4# weights donned 2x20 B   Therex Pt rates as pretty easy, exhibits improved ROM.  Seated DF 4# weights donned 2x20  Therex Pt rates as challenging, struggles to maintain LLE ROM  LAQ with 4# weights donned 2x12 each LE   Therex Pt cued to decrease speed of movement/improve control  Seated marches 2x16 alternating LEs  Therex TC for technique  Ambulating in // bars forward/backward 1x5, 2x2 CGA-min a Therex Performed for endurance/strengthening.  Pt still will heavy BUE support on bars. Shows improved foot clearance with steps.     Pt educated throughout session about proper posture and technique with exercises. Improved exercise technique, movement at target joints, use of target muscles after min to mod verbal, visual, tactile cues.  PT Education - 12/10/21 1223     Education provided Yes    Education Details exercise technique    Person(s) Educated Patient;Caregiver(s)    Health and safety inspector;Tactile cues;Verbal cues;Explanation    Comprehension Verbalized understanding;Returned demonstration;Tactile cues required;Verbal cues required;Need further instruction              PT Short Term Goals - 11/26/21 1530       PT SHORT TERM GOAL #1   Title Patient will be independent in home exercise program to improve strength/mobility for better functional independence with ADLs.    Baseline Pt does not have HEP; 09/25/2021- Patient reports compliant wth HEP consisting of LE strengthening.    Time 4    Period Weeks    Status Achieved    Target Date 09/05/21      PT SHORT TERM GOAL #2   Title Patient will increase FOTO score to equal to or greater than 4    to demonstrate statistically significant improvement in mobility and quality of life.    Baseline 28; 09/25/2021- will reassess next visit; 10/31/2021= 24; 2/14: 39    Time 6    Period Weeks    Status Achieved    Target Date 12/12/21      PT SHORT TERM GOAL #3   Title Pt and caregivers will understand PT recommendation and appropriate/safe use for wheelchair and seating for home use.    Baseline 11/12/2021- Patient is currently limited to manual w/c and difficulty with transfers and dependent on caregiver for navigation.; 2/14: Pt recently had WC eval. Pt verbalizes use of correct home equipment.    Time 1    Period Days    Status Achieved    Target Date 11/12/21               PT Long Term Goals - 11/26/21 1110       PT LONG TERM GOAL #1   Title Patient will be  independent with advanced and progressive home program for strength and endurance in order to transition to self management    Baseline Pt does not have current formal HEP; 09/25/2021- Patient does report compliance with initial HEP consisting on seated therex for LE strengthening. 10/31/2021= Patient is performing initial HEP with seated LE strengthening but will benefit from progressive strengthening including standing/walking/balance; 2/14: pt not consistently performing her current HEP    Time 12    Period Weeks  Status On-going    Target Date 01/23/22      PT LONG TERM GOAL #2   Title pt's FOTO score will improve by 4 points or more indicating improved confidence with daily tasks at home    Baseline 28 at intake: 09/25/2021- Will assess next visit; 10/31/2021= 24; 2/14: 39    Time 12    Period Weeks    Status Achieved    Target Date 01/23/22      PT LONG TERM GOAL #3   Title Patient will deny any falls over past 6 weeks to demonstrate improved safety awareness at home and work.    Baseline Pt reports 3 falls in previous 6 months; 09/25/2021= No falls reported and patient has been focusing on walking  some in clinic.    Time 6    Period Weeks    Status Achieved      PT LONG TERM GOAL #4   Title Patient will perform sit to and from stand from her wheelchair with min assist and no upper extremity support in order to indicate improvement in function.    Baseline Patient requires mod assist from author and bilateral lower extremity support to transition to and from sitting. 09/25/2021- Patient continues to require repetitive VC, TC, and min physical assistance to stand as wel as B UE Support.  10/31/2021= Patient continues to present with LE weakness- requires BUE support on armrest as well as min physical assist to stand.; 2/14: Pt requires BUE support of of chair armrests and CGA from PT to complete STS    Time 12    Period Weeks    Status On-going    Target Date 01/23/22      PT LONG  TERM GOAL #5   Title Pt will increase 10MWT by at least 0.13 m/s in order to demonstrate clinically significant improvement in community ambulation.    Baseline 09/25/2021=10 MWT= 0.045 m/s using front wheeled walker. 10/31/2021= 0.056 m/s using front wheeled walker; 2/14: 0.06 m/s with front wheeled walker and CGA    Time 12    Period Weeks    Status On-going    Target Date 01/23/22      PT LONG TERM GOAL #6   Title Patient will ambulate on level surfaces using front wheeled walker with CGA > 200 feet without rest break or loss of balance for improved ability for household ambulation.    Baseline 09/25/2021= Patient was able to walk 32 feet today with front wheeled walker, Min assist with close w/c follow with decreased heel to toe and decreased step length. 10/31/2021- Patient able to walk approx 45 feet with front wheeled walker with minimal step length and dragging right LE at times; 2/14: pt ambulates 33m on this date and reports fatigue    Time 12    Period Weeks    Status On-going    Target Date 01/23/22                   Plan - 12/10/21 1223     Clinical Impression Statement Pt highly motivated to participate in session. She responds to interventions well without pain. The pt shows improvement with gait in // bars by exhibiting foot clearance B this session with multiple reps with heel-striking. This indicates carryover between sessions. While pt shows progress, she is still limited by fatigue requiring frequent rest breaks througout. The pt will benefit from further skilled PT to improve mobility, LE strength, endurance, balance and gait.  Personal Factors and Comorbidities Age;Comorbidity 1;Comorbidity 2;Comorbidity 3+;Time since onset of injury/illness/exacerbation    Comorbidities Depression, anxiety, HTN, MS, DDD, GERD, Lymphedema    Examination-Activity Limitations Bed Mobility;Carry;Continence;Dressing;Hygiene/Grooming;Lift;Locomotion  Level;Squat;Stairs;Stand;Toileting;Transfers    Examination-Participation Restrictions Cleaning;Community Activity;Laundry;Meal Prep;Shop    Stability/Clinical Decision Making Evolving/Moderate complexity    Rehab Potential Fair    Clinical Impairments Affecting Rehab Potential (+) family support, motivated (-) MS, decreased confidence, co morbidities    PT Frequency 2x / week    PT Duration 12 weeks    PT Treatment/Interventions Patient/family education;Gait training;Neuromuscular re-education;Therapeutic exercise;Manual techniques;ADLs/Self Care Home Management;Stair training;DME Instruction;Functional mobility training;Therapeutic activities;Balance training;Wheelchair mobility training;Energy conservation;Joint Manipulations;Cryotherapy;Electrical Stimulation;Moist Heat;Passive range of motion    PT Next Visit Plan gait training, LE  strength training, endurance training, continue POC as indicated    PT Home Exercise Plan Access Code: [email or web address]; no updates; 1/9: Access Code: [email or web address]; no updates    Consulted and Agree with Plan of Care Patient             Patient will benefit from skilled therapeutic intervention in order to improve the following deficits and impairments:  Decreased strength, Decreased balance, Decreased activity tolerance, Decreased endurance, Difficulty walking, Abnormal gait, Improper body mechanics, Decreased mobility, Hypomobility, Impaired flexibility, Impaired perceived functional ability, Decreased range of motion, Pain  Visit Diagnosis: Muscle weakness (generalized)  Other abnormalities of gait and mobility  Unsteadiness on feet  Other lack of coordination     Problem List Patient Active Problem List   Diagnosis Date Noted   Pre-op evaluation 08/31/2021   Rash 01/27/2021   History of CVA (cerebrovascular accident) 01/05/2021   PBA (pseudobulbar affect)    MS (multiple sclerosis) ([facility])    Anxiety and depression    White matter periventricular  infarction ([place]) 12/13/2020   Generalized anxiety disorder 12/11/2020   Acute focal neurological deficit 12/08/2020   Edema 09/28/2020   Bloating 08/25/2020   Environmental allergies 07/17/2019   Hyperglycemia 07/17/2019   Cough 06/06/2019   Memory change 05/29/2019   Abnormal liver function tests 01/09/2019   Hemorrhoid 09/27/2018   Nausea 08/11/2018   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   Depression with anxiety 02/04/2018   Cognitive and behavioral changes 02/04/2018   Constipation 11/17/2017   Hx of adenomatous colonic polyps 06/06/2016   Lower extremity edema 01/13/2016   Bilateral ovarian cysts 10/16/2015   Health care maintenance 10/16/2015   Colon polyp 01/25/2015   Bone/cartilage disorder 01/25/2015   Headache 11/18/2014   Depression, recurrent ([place]) 11/18/2014   Greater tuberosity of humerus fracture 11/18/2014   Multiple sclerosis ([place]) 11/18/2014   Unsteady gait 11/18/2014   Dizziness 11/18/2014   Inconclusive mammogram 03/15/2013   S/P lumbar spinal fusion 01/04/2013   Surgery, other elective 12/03/2012   DDD (degenerative disc disease), lumbosacral 10/15/2012   Back ache 09/06/2012   Hypertension 09/06/2012   Back pain 09/06/2012   Hypercholesterolemia 01/28/2012   Climacteric 01/28/2012   Routine general medical examination at a health care facility 01/28/2012   Avitaminosis D 01/28/2012    Zollie Pee, PT 12/10/2021, 12:26 PM  [place] MAIN [facility] SERVICES [address] [place], [place], [address] Phone: [phone]   Fax:  [phone]  Name: Carly [name] MRN: [id number] Date of Birth: Jul 11, 1951

## 2021-12-10 NOTE — Therapy (Signed)
[place] MAIN [facility] SERVICES [address] [place], [place], [address] Phone: [phone]   Fax:  [phone]  Occupational Therapy Treatment  Patient Details  Name: Carly [name] MRN: [id number] Date of Birth: [birth year] Referring Provider (OT): Dr. Einar Pheasant   Encounter Date: 12/10/2021   OT End of Session - 12/10/21 [place]     Visit Number 2    Number of Visits 24    Date for OT Re-Evaluation 02/26/22    OT Start Time 40    OT Stop Time 1146    OT Time Calculation (min) 46 min    Activity Tolerance Patient tolerated treatment well    Behavior During Therapy [facility] for tasks assessed/performed             Past Medical History:  Diagnosis Date   Allergy    Anxiety    Aspiration pneumonia ([place])    Depression    Frequent headaches    H/O   GERD (gastroesophageal reflux disease)    History of chicken pox    History of colon polyps    Hx of migraines    Multiple sclerosis ([place]) 2011   OSA (obstructive sleep apnea)    PONV (postoperative nausea and vomiting)     Past Surgical History:  Procedure Laterality Date   BACK SURGERY     CHOLECYSTECTOMY     COLONOSCOPY WITH PROPOFOL N/A 05/18/2017   Procedure: COLONOSCOPY WITH PROPOFOL;  Surgeon: [name], MD;  Location: [facility] ENDOSCOPY;  Service: Endoscopy;  Laterality: N/A;   FOOT SURGERY  2015   GALLBLADDER SURGERY  2008   HARDWARE REMOVAL Left 02/14/2016   Procedure: LEFT FOOT REMOVAL DEEP IMPLANT;  Surgeon: [name], MD;  Location: [place];  Service: Orthopedics;  Laterality: Left;   HERNIA REPAIR     Inguinal Hernia Repair   SPINE SURGERY  2014    There were no vitals filed for this visit.   Subjective Assessment - 12/10/21 1128     Subjective  Pt reports she is going to the sleep lab tonight so they can figure out what kind of mask will work for her, she has tried 3 different ones and hasn't had any success.    Pertinent History MS  with weakness predominantly L side/spastic hemiplegia, depression/anxiety, HTN, CVA 05/08/21, chronic back pain, memory changes, falls    Limitations difficulty walking, unsteady gait, chronic leg pain and swelling, back pain, decreased balance, impaired functional R hand use, weakness    Special Tests Intake FOTO: 29/100 (functional outcome score)    Patient Stated Goals "To be able to write."    Currently in Pain? No/denies    Pain Score 0-No pain            Pt reports she has to go for another sleep study and the lab tonight and they are trying to find the right mask for her to wear, she tends to break the seal on the ones she has tried and still hasn't gotten a lot of sleep.  She feels extremely fatigued and difficulty concentrating on tasks at times.  She has weighted bracelets she wears on bilateral wrists to help with decreasing tremors.  She reports she worked on homework this week for handwriting and brought in for therapist to review.    Patient engaging in handwriting tasks in the clinic with the formulation of a list of 10 fruits.  Pt utilizing "the pencil grip" applied to her pencil,  able to think of 6/10 items and then required cues for the remaining 4 items.  Legibility was fair.   Pt instructed and performed 7  hand exercises to help promote handwriting, dexterity and flexibility. 1)  Fisting with arm then arm extension with fingers extended with BIG hand movements 2) oppositional movements of the thumb to each digit 3) wrist flex/ext with elbows [id number] 4) supination/pronation 5) tendon gliding with hook fist and then finger extension 6) tendon gliding with tabletop movement of fingers with MP flexion, PIP and DIP extension 7) MP flexion, PIP flexion, DIP extension Performed with therapist demo and occasional cues for form  Following the exercises she rewrote the same list with slowing down, paying attention to her form and size of letters with improved legibility.    Manipulation of Small glass beads to pick up from table, some with flat side down, others with flat side up, moving items to palm and using hand for storage.  Pt then moving items from palm to fingertips to place into a container.  Cues for hand function and technique.    Response to tx:  Pt compliant with home exercises for handwriting as directed from last session.  She demonstrates improved secure pen grasp with use of pen grip applied to her pencil/pen.  When cued to slow down and focus on letter formation, her legibility improved.  Pt to continue with exercises at home as well as daily practice with handwriting.  She would likely benefit from use of paraffin next session prior to hand function tasks.                       OT Education - 12/10/21 1130     Education Details OT goals, poc, HEP handwriting    Person(s) Educated Patient;Caregiver(s)    Methods Explanation;Demonstration    Comprehension Verbalized understanding;Returned demonstration;Need further instruction;Verbal cues required              OT Short Term Goals - 12/06/21 1502       OT SHORT TERM GOAL #1   Title Pt will perform HEP for R hand strengthening and coordination with min vc.    Baseline Eval: initaited at eval, further training needed    Time 6    Period Weeks    Status New    Target Date 01/15/22               OT Long Term Goals - 12/06/21 1503       OT LONG TERM GOAL #7   Title Pt will write (cursive) 2-3 sentences with >75% legibility.    Baseline Eval: R hand fatigues with cursive signature and is 75% legible, extra time needed    Time 12    Period Weeks    Status New    Target Date 02/26/22      OT LONG TERM GOAL #8   Title Pt will increase grip strength by 5 or more lbs to ease holding and carrying ADL supplies in R dominant hand.    Baseline Eval: R grip 24 (L 32, non dominant)    Time 12    Period Weeks    Status New    Target Date 02/26/22      OT LONG TERM  GOAL  #9   TITLE Pt will increase R hand FMC to enable pt to send texts pressing buttons with R dominant hand and >75% accuracy.    Baseline Eval: pt has spouse respond  to her text messages for her d/t poor accuracy with R dominant hand.    Time 12    Period Weeks    Status New    Target Date 02/26/22      OT LONG TERM GOAL  #10   TITLE Pt will be able to comb hair with R dominant hand.    Baseline Eval: combing hair is difficult; caregiver combs hair    Time 12    Period Weeks    Status New    Target Date 02/26/22                   Plan - 12/10/21 1829     Clinical Impression Statement Pt compliant with home exercises for handwriting as directed from last session.  She demonstrates improved secure pen grasp with use of pen grip applied to her pencil/pen.  When cued to slow down and focus on letter formation, her legibility improved.  Pt to continue with exercises at home as well as daily practice with handwriting.  She would likely benefit from use of paraffin next session prior to hand function tasks.    OT Occupational Profile and History Detailed Assessment- Review of Records and additional review of physical, cognitive, psychosocial history related to current functional performance    Occupational performance deficits (Please refer to evaluation for details): ADL's;IADL's;Leisure    Body Structure / Function / Physical Skills ADL;Coordination;GMC;UE functional use;Balance;Flexibility;IADL;FMC;Dexterity;Strength    Rehab Potential Fair    Clinical Decision Making Several treatment options, min-mod task modification necessary    Comorbidities Affecting Occupational Performance: Presence of comorbidities impacting occupational performance    Comorbidities impacting occupational performance description: MS, anxiety, depression    Modification or Assistance to Complete Evaluation  Min-Moderate modification of tasks or assist with assess necessary to complete eval    OT Frequency  2x / week    OT Duration 12 weeks    OT Treatment/Interventions Self-care/ADL training;Therapeutic exercise;Coping strategies training;Neuromuscular education;Patient/family education;Therapeutic activities;DME and/or AE instruction;Manual Therapy    Plan OT to address R hand weakness and lack of coordination currently impacting writing, phone use, and basic ADL performance.    OT Home Exercise Plan Instructed in handwriting exercises; issued handout    Consulted and Agree with Plan of Care Patient             Patient will benefit from skilled therapeutic intervention in order to improve the following deficits and impairments:   Body Structure / Function / Physical Skills: ADL, Coordination, GMC, UE functional use, Balance, Flexibility, IADL, FMC, Dexterity, Strength       Visit Diagnosis: Muscle weakness (generalized)  Other lack of coordination    Problem List Patient Active Problem List   Diagnosis Date Noted   Pre-op evaluation 08/31/2021   Rash 01/27/2021   History of CVA (cerebrovascular accident) 01/05/2021   PBA (pseudobulbar affect)    MS (multiple sclerosis) ([place])    Anxiety and depression    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   Generalized anxiety disorder 12/11/2020   Acute focal neurological deficit 12/08/2020   Edema 09/28/2020   Bloating 08/25/2020   Environmental allergies 07/17/2019   Hyperglycemia 07/17/2019   Cough 06/06/2019   Memory change 05/29/2019   Abnormal liver function tests 01/09/2019   Hemorrhoid 09/27/2018   Nausea 08/11/2018   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   Depression with anxiety 02/04/2018   Cognitive and behavioral changes 02/04/2018   Constipation 11/17/2017   Hx of adenomatous colonic polyps 06/06/2016  Lower extremity edema 01/13/2016   Bilateral ovarian cysts 10/16/2015   Health care maintenance 10/16/2015   Colon polyp 01/25/2015   Bone/cartilage disorder 01/25/2015   Headache 11/18/2014    Depression, recurrent ([place]) 11/18/2014   Greater tuberosity of humerus fracture 11/18/2014   Multiple sclerosis ([place]) 11/18/2014   Unsteady gait 11/18/2014   Dizziness 11/18/2014   Inconclusive mammogram 03/15/2013   S/P lumbar spinal fusion 01/04/2013   Surgery, other elective 12/03/2012   DDD (degenerative disc disease), lumbosacral 10/15/2012   Back ache 09/06/2012   Hypertension 09/06/2012   Back pain 09/06/2012   Hypercholesterolemia 01/28/2012   Climacteric 01/28/2012   Routine general medical examination at a health care facility 01/28/2012   Avitaminosis D 01/28/2012   Diyan Dave T Marlaya Turck, OTR/L, CLT  Raegan Sipp, OT 12/10/2021, 6:50 PM  [place] MAIN [facility] SERVICES [address] [place], [place], [address] Phone: [phone]   Fax:  [phone]  Name: ASHEY TRAMONTANA MRN: [id number] Date of Birth: 02-25-1951

## 2021-12-12 ENCOUNTER — Ambulatory Visit: Payer: Medicare Other | Attending: Internal Medicine

## 2021-12-12 ENCOUNTER — Other Ambulatory Visit: Payer: Self-pay

## 2021-12-12 ENCOUNTER — Encounter: Payer: Self-pay | Admitting: Psychology

## 2021-12-12 ENCOUNTER — Ambulatory Visit: Payer: Medicare Other | Admitting: Occupational Therapy

## 2021-12-12 DIAGNOSIS — M6281 Muscle weakness (generalized): Secondary | ICD-10-CM

## 2021-12-12 DIAGNOSIS — R2689 Other abnormalities of gait and mobility: Secondary | ICD-10-CM | POA: Diagnosis not present

## 2021-12-12 DIAGNOSIS — R2681 Unsteadiness on feet: Secondary | ICD-10-CM

## 2021-12-12 DIAGNOSIS — R482 Apraxia: Secondary | ICD-10-CM | POA: Insufficient documentation

## 2021-12-12 DIAGNOSIS — R262 Difficulty in walking, not elsewhere classified: Secondary | ICD-10-CM | POA: Insufficient documentation

## 2021-12-12 DIAGNOSIS — R278 Other lack of coordination: Secondary | ICD-10-CM | POA: Insufficient documentation

## 2021-12-12 NOTE — Progress Notes (Signed)
Neuropsychology Visit  Patient:  Carly [name]   DOB: [birth year]  MR Number: [id number]  Location: [place] [facility] [place], [place] [id number] [place] [address] Dept: [phone]  Date of Service: 11/28/2021  Start: 1 PM End: 2 PM  Today's visit was a 1 hour visit that was conducted in my outpatient clinic office.  It was in person visit.  The patient myself were present.  This was a follow-up from initial evaluation that was conducted on 05/14/2021.  Duration of Service: 1 Hour  Provider/Observer:     Edgardo Roys PsyD  Chief Complaint:      Chief Complaint  Patient presents with   Cerebrovascular Accident   Depression    Reason For Service:      Carly [name] is a 71 year old female referred for follow-up visit after her inpatient hospitalization in March of this year.  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  The patient is continued to have right arm motor deficits and difficulty with use.  She is also having trouble eating and swallowing and trouble getting in and out of the shower with increased fear of falling.  The patient's husband reports that the patient has fallen a number of times and while not falling in the shower she has a great deal of phobia and fear around falling in the shower.  They describe an overreaction to getting into the shower and she still has trouble with her right dominant hand especially when in use.  The patient's past medical history includes history of MS, GERD, anxiety/depression, headache.  The patient had presented to the emergency department on 12/08/2020 after a 1 day history of weakness with difficulty standing, dizziness, mental status changes and difficulty picking up items with her right hand.   MRI of her brain was done revealing acute white matter infarcts and deep white matter left corona radiata and extensive abnormal T2/FLAIR hyperintense signal.  MRI brain was negative for stenosis or LVO but showed diffuse atherosclerotic irregularity most severe in PCA branches.  Neurology felt that the stroke was due to small vessel disease.  During her hospitalization psychiatry was initially consulted with assistance due to management of chronic generalized anxiety disorder with bouts of lability.  The patient has chronic left-sided weakness likely due to MS and now right-sided weakness affecting her balance and motor functioning.  Patient had a right lateral lean with left knee buckling along with initial reduced information processing speed and difficulties with memory recall versus an inability to learn and store new information.  I did see the patient during her inpatient hospitalization on the comprehensive rehabilitation unit for 1 visit.  This appointment is a follow-up from her rehab stay and a result of her continuation of difficulties.  The patient acknowledges an exacerbation of her longstanding anxiety and depressive symptomatology.  She reports that fear of falling and other difficulties with present.  Treatment Interventions:  Therapeutic interventions around issues associated with exacerbation of longstanding anxiety and depression and the development of pseudobulbar affect type symptoms.  Participation Level:   Active  Participation Quality:  Appropriate      Behavioral Observation:  Well Groomed, Alert, and Appropriate.   Current Psychosocial Factors: The patient reports that she is struggled with some of her mood changes  including anxiety and depressive symptomatology.  While these were present she has continued to struggle with residual effects of her stroke including left lower extremity weakness and gait disorder.  The patient is continue to have trouble eating and swallowing  appropriately and getting in and out of the shower.  Content of Session:   Reviewed current symptoms and continue to work on therapeutic interventions around the emotional impact her residual cognitive deficits and motor deficits have had since her stroke.  Effectiveness of Interventions: Patient was open and actively engaged in therapeutic process.  Target Goals:   Will better coping skills around adjustment to residual effects of her CVA.  Goals Last Reviewed:   11/28/2020  Goals Addressed Today:    Today we worked on cognitive behavioral therapeutic interventions around better management of her residual effects of her significant medical issues.  Impression/Diagnosis:   Carly [name] is a 71 year old female referred for follow-up visit after her inpatient hospitalization in March of this year.  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  The patient is continued to have right arm motor deficits and difficulty with use.  She is also having trouble eating and swallowing and trouble getting in and out of the shower with increased fear of falling.  The patient's husband reports that the patient has fallen a number of times and while not falling in the shower she has a great deal of phobia and fear around falling in the shower.  They describe an overreaction to getting into the shower and she still has trouble with her right dominant hand especially when in use.  The patient's past medical history includes history of MS, GERD, anxiety/depression, headache.  Diagnosis:   History of CVA (cerebrovascular accident)  PBA (pseudobulbar affect)  Anxiety state  OSA (obstructive sleep apnea)    Ilean Skill, Psy.D. Clinical Psychologist Neuropsychologist

## 2021-12-12 NOTE — Therapy (Signed)
[place] MAIN [facility] SERVICES [address] [place], [place], [address] Phone: [phone]   Fax:  [phone]  Physical Therapy Treatment  Patient Details  Name: Carly [name] MRN: [id number] Date of Birth: 1951/08/11 Referring Provider (PT): Dr. Einar Pheasant for wheelchair eval for 11/12/2021   Encounter Date: 12/12/2021   PT End of Session - 12/12/21 1700     Visit Number 24    Number of Visits 24    Date for PT Re-Evaluation 01/23/22    Authorization Time Period 08/08/21-10/31/20; Recert 8/50/2774- 11/09/7865    Progress Note Due on Visit 20    PT Start Time 1520    PT Stop Time 1559    PT Time Calculation (min) 39 min    Equipment Utilized During Treatment Gait belt    Activity Tolerance Patient tolerated treatment well;Patient limited by fatigue    Behavior During Therapy WFL for tasks assessed/performed             Past Medical History:  Diagnosis Date   Allergy    Anxiety    Aspiration pneumonia ([place])    Depression    Frequent headaches    H/O   GERD (gastroesophageal reflux disease)    History of chicken pox    History of colon polyps    Hx of migraines    Multiple sclerosis ([place]) 2011   OSA (obstructive sleep apnea)    PONV (postoperative nausea and vomiting)     Past Surgical History:  Procedure Laterality Date   BACK SURGERY     CHOLECYSTECTOMY     COLONOSCOPY WITH PROPOFOL N/A 05/18/2017   Procedure: COLONOSCOPY WITH PROPOFOL;  Surgeon: [name], MD;  Location: [facility] ENDOSCOPY;  Service: Endoscopy;  Laterality: N/A;   FOOT SURGERY  2015   GALLBLADDER SURGERY  2008   HARDWARE REMOVAL Left 02/14/2016   Procedure: LEFT FOOT REMOVAL DEEP IMPLANT;  Surgeon: [name], MD;  Location: [place];  Service: Orthopedics;  Laterality: Left;   HERNIA REPAIR     Inguinal Hernia Repair   SPINE SURGERY  2014    There were no vitals filed for this visit.   Subjective Assessment - 12/12/21  1522     Subjective Pt reports no pain currently. Pt had sleep study and is waiting for the results. She denies any falls or stumbles.    Patient is accompained by: Family member    Pertinent History Patient is a 71 year old female referral for power wheelchair evaluation from Dr. Einar Pheasant. 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  Patient reports her current manual w/c is insufficient for her needs- She states she is unable to manuever independently. She reports multiple falls (3) in past 6 months.    Limitations Standing;Walking    How long can you sit comfortably? no issues    How long can you stand comfortably? 15 min.    How long can you walk comfortably? 10 min.    Patient Stated Goals Standing, walking, transfers    Currently in Pain? No/denies    Pain Onset More than a month ago   leg pain and swelling first noticed during long care trip ~ 7 hours             Interventions -   Seated: 3# weights donned each LE for the following- LAQ 2x15 each LE  Kicking soccer  ball to knock over cones x 2 rounds, focus on LLE Kicking soccer ball back and forth to PT focus on LLE; rates easy, cuing for increased amplitude/power, pt performs several reps Seated marches 2x20 alternating LEs  3# RLE and no weight LLE, seated step-ups onto airex (abd with ER) - pt performs for several minutes, primary focus on LLE, very challenging for pt  STS 10x 3 sets (pulling to stand) with CGA. Pt attempts pushing from arm rests on this date but limited due to fatigue. Tricep dips 2x5; very challenging Seated DF 15x B Seated adductor squeezes 2x15 with 2 sec holds  Seated marches 16x alternating, performed without weights on LEs. Pt fatigues with reps   Pt educated throughout session about proper posture and technique with exercises. Improved exercise technique, movement at target joints, use of  target muscles after min to mod verbal, visual, tactile cues.     PT Education - 12/12/21 1659     Education provided Yes    Education Details exercise technique, body mechanics    Person(s) Educated Patient    Methods Explanation;Demonstration;Verbal cues;Tactile cues    Comprehension Verbalized understanding;Returned demonstration;Tactile cues required;Verbal cues required;Need further instruction              PT Short Term Goals - 11/26/21 1530       PT SHORT TERM GOAL #1   Title Patient will be independent in home exercise program to improve strength/mobility for better functional independence with ADLs.    Baseline Pt does not have HEP; 09/25/2021- Patient reports compliant wth HEP consisting of LE strengthening.    Time 4    Period Weeks    Status Achieved    Target Date 09/05/21      PT SHORT TERM GOAL #2   Title Patient will increase FOTO score to equal to or greater than 4    to demonstrate statistically significant improvement in mobility and quality of life.    Baseline 28; 09/25/2021- will reassess next visit; 10/31/2021= 24; 2/14: 39    Time 6    Period Weeks    Status Achieved    Target Date 12/12/21      PT SHORT TERM GOAL #3   Title Pt and caregivers will understand PT recommendation and appropriate/safe use for wheelchair and seating for home use.    Baseline 11/12/2021- Patient is currently limited to manual w/c and difficulty with transfers and dependent on caregiver for navigation.; 2/14: Pt recently had WC eval. Pt verbalizes use of correct home equipment.    Time 1    Period Days    Status Achieved    Target Date 11/12/21               PT Long Term Goals - 11/26/21 1110       PT LONG TERM GOAL #1   Title Patient will be independent with advanced and progressive home program for strength and endurance in order to transition to self management    Baseline Pt does not have current formal HEP; 09/25/2021- Patient does report compliance with  initial HEP consisting on seated therex for LE strengthening. 10/31/2021= Patient is performing initial HEP with seated LE strengthening but will benefit from progressive strengthening including standing/walking/balance; 2/14: pt not consistently performing her current HEP    Time 12    Period Weeks    Status On-going    Target Date 01/23/22      PT LONG TERM GOAL #2   Title pt's FOTO  score will improve by 4 points or more indicating improved confidence with daily tasks at home    Baseline 28 at intake: 09/25/2021- Will assess next visit; 10/31/2021= 24; 2/14: 39    Time 12    Period Weeks    Status Achieved    Target Date 01/23/22      PT LONG TERM GOAL #3   Title Patient will deny any falls over past 6 weeks to demonstrate improved safety awareness at home and work.    Baseline Pt reports 3 falls in previous 6 months; 09/25/2021= No falls reported and patient has been focusing on walking  some in clinic.    Time 6    Period Weeks    Status Achieved      PT LONG TERM GOAL #4   Title Patient will perform sit to and from stand from her wheelchair with min assist and no upper extremity support in order to indicate improvement in function.    Baseline Patient requires mod assist from author and bilateral lower extremity support to transition to and from sitting. 09/25/2021- Patient continues to require repetitive VC, TC, and min physical assistance to stand as wel as B UE Support.  10/31/2021= Patient continues to present with LE weakness- requires BUE support on armrest as well as min physical assist to stand.; 2/14: Pt requires BUE support of of chair armrests and CGA from PT to complete STS    Time 12    Period Weeks    Status On-going    Target Date 01/23/22      PT LONG TERM GOAL #5   Title Pt will increase 10MWT by at least 0.13 m/s in order to demonstrate clinically significant improvement in community ambulation.    Baseline 09/25/2021=10 MWT= 0.045 m/s using front wheeled walker.  10/31/2021= 0.056 m/s using front wheeled walker; 2/14: 0.06 m/s with front wheeled walker and CGA    Time 12    Period Weeks    Status On-going    Target Date 01/23/22      PT LONG TERM GOAL #6   Title Patient will ambulate on level surfaces using front wheeled walker with CGA > 200 feet without rest break or loss of balance for improved ability for household ambulation.    Baseline 09/25/2021= Patient was able to walk 32 feet today with front wheeled walker, Min assist with close w/c follow with decreased heel to toe and decreased step length. 10/31/2021- Patient able to walk approx 45 feet with front wheeled walker with minimal step length and dragging right LE at times; 2/14: pt ambulates 16m on this date and reports fatigue    Time 12    Period Weeks    Status On-going    Target Date 01/23/22                   Plan - 12/12/21 1703     Clinical Impression Statement Pt with excellent motivation to participate in session. Pt able to perform increased reps of STS by pulling to stand but has difficulty with pushing from arm rests to complete stands. Pt shows improved BLE power and coordination with seated kicking interventions. Pt still generally limited due to fatigue. The pt will benefit from further skilled PT to improve mobility, strength, endurance, balance and gait.    Personal Factors and Comorbidities Age;Comorbidity 1;Comorbidity 2;Comorbidity 3+;Time since onset of injury/illness/exacerbation    Comorbidities Depression, anxiety, HTN, MS, DDD, GERD, Lymphedema    Examination-Activity Limitations Bed Mobility;Carry;Continence;Dressing;Hygiene/Grooming;Lift;Locomotion  Level;Squat;Stairs;Stand;Toileting;Transfers    Examination-Participation Restrictions Cleaning;Community Activity;Laundry;Meal Prep;Shop    Stability/Clinical Decision Making Evolving/Moderate complexity    Rehab Potential Fair    Clinical Impairments Affecting Rehab Potential (+) family support, motivated (-)  MS, decreased confidence, co morbidities    PT Frequency 2x / week    PT Duration 12 weeks    PT Treatment/Interventions Patient/family education;Gait training;Neuromuscular re-education;Therapeutic exercise;Manual techniques;ADLs/Self Care Home Management;Stair training;DME Instruction;Functional mobility training;Therapeutic activities;Balance training;Wheelchair mobility training;Energy conservation;Joint Manipulations;Cryotherapy;Electrical Stimulation;Moist Heat;Passive range of motion    PT Next Visit Plan gait training, LE  strength training, endurance training, continue POC as indicated    PT Home Exercise Plan Access Code: [email or web address]; no updates; 1/9: Access Code: [email or web address]; no updates    Consulted and Agree with Plan of Care Patient             Patient will benefit from skilled therapeutic intervention in order to improve the following deficits and impairments:  Decreased strength, Decreased balance, Decreased activity tolerance, Decreased endurance, Difficulty walking, Abnormal gait, Improper body mechanics, Decreased mobility, Hypomobility, Impaired flexibility, Impaired perceived functional ability, Decreased range of motion, Pain  Visit Diagnosis: Muscle weakness (generalized)  Other abnormalities of gait and mobility  Other lack of coordination  Unsteadiness on feet     Problem List Patient Active Problem List   Diagnosis Date Noted   Pre-op evaluation 08/31/2021   Rash 01/27/2021   History of CVA (cerebrovascular accident) 01/05/2021   PBA (pseudobulbar affect)    MS (multiple sclerosis) (HCC)    Anxiety and depression    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   Generalized anxiety disorder 12/11/2020   Acute focal neurological deficit 12/08/2020   Edema 09/28/2020   Bloating 08/25/2020   Environmental allergies 07/17/2019   Hyperglycemia 07/17/2019   Cough 06/06/2019   Memory change 05/29/2019   Abnormal liver function tests 01/09/2019    Hemorrhoid 09/27/2018   Nausea 08/11/2018   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   Depression with anxiety 02/04/2018   Cognitive and behavioral changes 02/04/2018   Constipation 11/17/2017   Hx of adenomatous colonic polyps 06/06/2016   Lower extremity edema 01/13/2016   Bilateral ovarian cysts 10/16/2015   Health care maintenance 10/16/2015   Colon polyp 01/25/2015   Bone/cartilage disorder 01/25/2015   Headache 11/18/2014   Depression, recurrent ([place]) 11/18/2014   Greater tuberosity of humerus fracture 11/18/2014   Multiple sclerosis ([place]) 11/18/2014   Unsteady gait 11/18/2014   Dizziness 11/18/2014   Inconclusive mammogram 03/15/2013   S/P lumbar spinal fusion 01/04/2013   Surgery, other elective 12/03/2012   DDD (degenerative disc disease), lumbosacral 10/15/2012   Back ache 09/06/2012   Hypertension 09/06/2012   Back pain 09/06/2012   Hypercholesterolemia 01/28/2012   Climacteric 01/28/2012   Routine general medical examination at a health care facility 01/28/2012   Avitaminosis D 01/28/2012    Zollie Pee, PT 12/12/2021, 5:05 PM  [place] MAIN [facility] SERVICES [address] [place], [place], [address] Phone: [phone]   Fax:  [phone]  Name: Carly [name] MRN: [id number] Date of Birth: December 11, 1950

## 2021-12-12 NOTE — Telephone Encounter (Signed)
error 

## 2021-12-13 ENCOUNTER — Telehealth (INDEPENDENT_AMBULATORY_CARE_PROVIDER_SITE_OTHER): Payer: Medicare Other | Admitting: Internal Medicine

## 2021-12-13 ENCOUNTER — Ambulatory Visit: Payer: Medicare Other

## 2021-12-13 ENCOUNTER — Encounter: Payer: Self-pay | Admitting: Occupational Therapy

## 2021-12-13 ENCOUNTER — Encounter: Payer: Self-pay | Admitting: Internal Medicine

## 2021-12-13 DIAGNOSIS — I10 Essential (primary) hypertension: Secondary | ICD-10-CM

## 2021-12-13 DIAGNOSIS — R600 Localized edema: Secondary | ICD-10-CM | POA: Diagnosis not present

## 2021-12-13 DIAGNOSIS — K5900 Constipation, unspecified: Secondary | ICD-10-CM

## 2021-12-13 DIAGNOSIS — G4733 Obstructive sleep apnea (adult) (pediatric): Secondary | ICD-10-CM | POA: Diagnosis not present

## 2021-12-13 DIAGNOSIS — G35 Multiple sclerosis: Secondary | ICD-10-CM | POA: Diagnosis not present

## 2021-12-13 DIAGNOSIS — K635 Polyp of colon: Secondary | ICD-10-CM | POA: Diagnosis not present

## 2021-12-13 DIAGNOSIS — F339 Major depressive disorder, recurrent, unspecified: Secondary | ICD-10-CM

## 2021-12-13 DIAGNOSIS — R739 Hyperglycemia, unspecified: Secondary | ICD-10-CM

## 2021-12-13 DIAGNOSIS — F411 Generalized anxiety disorder: Secondary | ICD-10-CM

## 2021-12-13 DIAGNOSIS — E7800 Pure hypercholesterolemia, unspecified: Secondary | ICD-10-CM

## 2021-12-13 DIAGNOSIS — Z8673 Personal history of transient ischemic attack (TIA), and cerebral infarction without residual deficits: Secondary | ICD-10-CM

## 2021-12-13 DIAGNOSIS — H6123 Impacted cerumen, bilateral: Secondary | ICD-10-CM | POA: Diagnosis not present

## 2021-12-13 DIAGNOSIS — R2681 Unsteadiness on feet: Secondary | ICD-10-CM

## 2021-12-13 NOTE — Therapy (Signed)
[place] MAIN [facility] SERVICES [address] [place], [place], [address] Phone: [phone]   Fax:  [phone]  Occupational Therapy Treatment  Patient Details  Name: Carly [name] MRN: [id number] Date of Birth: 10-May-1951 Referring Provider (OT): Dr. Einar Pheasant   Encounter Date: 12/12/2021   OT End of Session - 12/13/21 1241     Visit Number 3    Number of Visits 24    Date for OT Re-Evaluation 02/26/22    Authorization Type Initial eval 06/12/21    OT Start Time 1440    OT Stop Time 1515    OT Time Calculation (min) 35 min    Activity Tolerance Patient tolerated treatment well    Behavior During Therapy [facility] for tasks assessed/performed             Past Medical History:  Diagnosis Date   Allergy    Anxiety    Aspiration pneumonia ([place])    Depression    Frequent headaches    H/O   GERD (gastroesophageal reflux disease)    History of chicken pox    History of colon polyps    Hx of migraines    Multiple sclerosis ([place]) 2011   OSA (obstructive sleep apnea)    PONV (postoperative nausea and vomiting)     Past Surgical History:  Procedure Laterality Date   BACK SURGERY     CHOLECYSTECTOMY     COLONOSCOPY WITH PROPOFOL N/A 05/18/2017   Procedure: COLONOSCOPY WITH PROPOFOL;  Surgeon: [name], MD;  Location: [facility] ENDOSCOPY;  Service: Endoscopy;  Laterality: N/A;   FOOT SURGERY  2015   GALLBLADDER SURGERY  2008   HARDWARE REMOVAL Left 02/14/2016   Procedure: LEFT FOOT REMOVAL DEEP IMPLANT;  Surgeon: [name], MD;  Location: [place];  Service: Orthopedics;  Laterality: Left;   HERNIA REPAIR     Inguinal Hernia Repair   SPINE SURGERY  2014    There were no vitals filed for this visit.   Subjective Assessment - 12/13/21 1230     Subjective  Pt reports her sleep study went well, she was able to sleep at least 5 hours and they think they found a mask that will work for her.  Pt arrived and  reports anxiety over thinking she lost her purse.  Secretary called husband and he had the purse in the car and brought it back to the clinic for the patient.    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 No/denies    Pain Score 0-No pain             Pt seen for use of Paraffin bath to decrease pain, increase range of motion and increase tissue mobility.  8 mins total  Pt seen for strengthening with use of 1# dumbells for wrist flex/ext, UD RD for 10 reps for 2 sets each.   Pt instructed and performed 7  hand exercises to help promote handwriting, dexterity and flexibility. 1)  Fisting with arm then arm extension with fingers extended with BIG hand movements 2) oppositional movements of the thumb to each digit  3) wrist flex/ext with elbows [id number] 4) supination/pronation 5) tendon gliding with hook fist and then finger extension 6) tendon gliding with tabletop movement of fingers with MP flexion, PIP and DIP extension 7) MP flexion, PIP flexion, DIP extension Performed with therapist demo and occasional cues for form  Neuromuscular reed:  Pt seen for unknotting exercises with cues for prehension patterns to unknot medium nylon rope.    Reviewed HEP with handwriting.  Pt ordered additional pen grips for home use.    Response to tx: Pt progressing well towards goals.  She was distracted at the beginning of session since she thought she had her purse but had left it in the car.  Reports she has anxiety at times.  Pt responds well to cues for exercises, both verbal and tactile.  She ordered  additional pen grips to use at home.  Continue to work towards goals to improve right UE function including handwriting.                       OT Education - 12/13/21 1241     Education Details home exercises, handwriting, paraffin    Person(s) Educated Patient;Caregiver(s)    Methods Explanation;Demonstration    Comprehension Verbalized understanding;Returned demonstration;Need further instruction;Verbal cues required              OT Short Term Goals - 12/06/21 1502       OT SHORT TERM GOAL #1   Title Pt will perform HEP for R hand strengthening and coordination with min vc.    Baseline Eval: initaited at eval, further training needed    Time 6    Period Weeks    Status New    Target Date 01/15/22               OT Long Term Goals - 12/06/21 1503       OT LONG TERM GOAL #7   Title Pt will write (cursive) 2-3 sentences with >75% legibility.    Baseline Eval: R hand fatigues with cursive signature and is 75% legible, extra time needed    Time 12    Period Weeks    Status New    Target Date 02/26/22      OT LONG TERM GOAL #8   Title Pt will increase grip strength by 5 or more lbs to ease holding and carrying ADL supplies in R dominant hand.    Baseline Eval: R grip 24 (L 32, non dominant)    Time 12    Period Weeks    Status New    Target Date 02/26/22      OT LONG TERM GOAL  #9   TITLE Pt will increase R hand FMC to enable pt to send texts pressing buttons with R dominant hand and >75% accuracy.    Baseline Eval: pt has spouse respond to her text messages for her d/t poor accuracy with R dominant hand.    Time 12    Period Weeks    Status New    Target Date 02/26/22      OT LONG TERM GOAL  #10   TITLE Pt will be able to comb hair with R dominant hand.    Baseline Eval: combing hair is difficult; caregiver combs hair    Time 12    Period Weeks    Status New    Target Date 02/26/22  Plan - 12/13/21 1242      Clinical Impression Statement Pt progressing well towards goals.  She was distracted at the beginning of session since she thought she had her purse but had left it in the car.  Reports she has anxiety at times.  Pt responds well to cues for exercises, both verbal and tactile.  She ordered additional pen grips to use at home.  Continue to work towards goals to improve right UE function including handwriting.    OT Occupational Profile and History Detailed Assessment- Review of Records and additional review of physical, cognitive, psychosocial history related to current functional performance    Occupational performance deficits (Please refer to evaluation for details): ADL's;IADL's;Leisure    Body Structure / Function / Physical Skills ADL;Coordination;GMC;UE functional use;Balance;Flexibility;IADL;FMC;Dexterity;Strength    Rehab Potential Fair    Clinical Decision Making Several treatment options, min-mod task modification necessary    Comorbidities Affecting Occupational Performance: Presence of comorbidities impacting occupational performance    Comorbidities impacting occupational performance description: MS, anxiety, depression    Modification or Assistance to Complete Evaluation  Min-Moderate modification of tasks or assist with assess necessary to complete eval    OT Frequency 2x / week    OT Duration 12 weeks    OT Treatment/Interventions Self-care/ADL training;Therapeutic exercise;Coping strategies training;Neuromuscular education;Patient/family education;Therapeutic activities;DME and/or AE instruction;Manual Therapy    Plan OT to address R hand weakness and lack of coordination currently impacting writing, phone use, and basic ADL performance.    OT Home Exercise Plan Instructed in handwriting exercises; issued handout    Consulted and Agree with Plan of Care Patient             Patient will benefit from skilled therapeutic intervention in order to improve the following deficits  and impairments:   Body Structure / Function / Physical Skills: ADL, Coordination, GMC, UE functional use, Balance, Flexibility, IADL, FMC, Dexterity, Strength       Visit Diagnosis: Muscle weakness (generalized)  Other lack of coordination  Unsteadiness on feet    Problem List Patient Active Problem List   Diagnosis Date Noted   Pre-op evaluation 08/31/2021   Rash 01/27/2021   History of CVA (cerebrovascular accident) 01/05/2021   PBA (pseudobulbar affect)    MS (multiple sclerosis) ([place])    Anxiety and depression    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   Generalized anxiety disorder 12/11/2020   Acute focal neurological deficit 12/08/2020   Edema 09/28/2020   Bloating 08/25/2020   Environmental allergies 07/17/2019   Hyperglycemia 07/17/2019   Cough 06/06/2019   Memory change 05/29/2019   Abnormal liver function tests 01/09/2019   Hemorrhoid 09/27/2018   Nausea 08/11/2018   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   Depression with anxiety 02/04/2018   Cognitive and behavioral changes 02/04/2018   Constipation 11/17/2017   Hx of adenomatous colonic polyps 06/06/2016   Lower extremity edema 01/13/2016   Bilateral ovarian cysts 10/16/2015   Health care maintenance 10/16/2015   Colon polyp 01/25/2015   Bone/cartilage disorder 01/25/2015   Headache 11/18/2014   Depression, recurrent ([place]) 11/18/2014   Greater tuberosity of humerus fracture 11/18/2014   Multiple sclerosis ([place]) 11/18/2014   Unsteady gait 11/18/2014   Dizziness 11/18/2014   Inconclusive mammogram 03/15/2013   S/P lumbar spinal fusion 01/04/2013   Surgery, other elective 12/03/2012   DDD (degenerative disc disease), lumbosacral 10/15/2012   Back ache 09/06/2012   Hypertension 09/06/2012   Back pain 09/06/2012   Hypercholesterolemia 01/28/2012   Climacteric 01/28/2012  Routine general medical examination at a health care facility 01/28/2012   Avitaminosis D 01/28/2012   Tosca Pletz T  Tomasita Morrow, OTR/L, CLT   Kalven Ganim, OT 12/13/2021, 12:58 PM  [place] MAIN [facility] SERVICES [address] [place], [place], [address] Phone: [phone]   Fax:  [phone]  Name: Carly [name] MRN: [id number] Date of Birth: July 22, 1951

## 2021-12-13 NOTE — Progress Notes (Signed)
Patient ID: Carly [name], female   DOB: [birth year], 71 y.o.   MRN: [id number]   Virtual Visit via video Note  This visit type was conducted due to national recommendations for restrictions regarding the COVID-19 pandemic (e.g. social distancing).  This format is felt to be most appropriate for this patient at this time.  All issues noted in this document were discussed and addressed.  No physical exam was performed (except for noted visual exam findings with Video Visits).   I connected with [name] by a video enabled telemedicine application and verified that I am speaking with the correct person using two identifiers. Location patient: home Location provider: work Persons participating in the virtual visit: patient, provider  The limitations, risks, security and privacy concerns of performing an evaluation and management service by video and the availability of in person appointments have been discussed.  It has also been discussed with the patient that there may be a patient responsible charge related to this service. The patient expressed understanding and agreed to proceed.   Reason for visit: work in appt  HPI: Work in to discuss - colonoscopy and cataract surgery.  Hospitalized 11/2020 - CVA.  Has been on plavix.  Colonoscopy was postponed given CVA.  She is interested now to schedule f/u colonoscopy.  Has discussed with neurology.  See their recommendations for plavix.  She has had no recurring symptoms.  No chest pain or sob.  Breathing stable.  No increased cough or congestion.  No acid reflux reported.  No abdominal pain.  Bowels moving.  Taking lasix 20mg  q day.  No increased swelling.  Doing well on this dose.  Has lymphedema pump. She plans to discuss with GI, previous issues she had with aspiration - when had colonoscopy.  Also had questions regarding cataract surgery. Plans to discuss with her ophthalmologist.  She has MS.  Is followed by neurology.  She has just moved  into the [place].  Has to walk to dining hall, etc.  Feel would benefit from motorized chair - [employer] - wheelchair has contacted.  She is limited in walking long distance and also unsteadiness around her home.  Working with PT.     ROS: See pertinent positives and negatives per HPI.  Past Medical History:  Diagnosis Date   Allergy    Anxiety    Aspiration pneumonia (HCC)    Depression    Frequent headaches    H/O   GERD (gastroesophageal reflux disease)    History of chicken pox    History of colon polyps    Hx of migraines    Multiple sclerosis ([place]) 2011   OSA (obstructive sleep apnea)    PONV (postoperative nausea and vomiting)     Past Surgical History:  Procedure Laterality Date   BACK SURGERY     CHOLECYSTECTOMY     COLONOSCOPY WITH PROPOFOL N/A 05/18/2017   Procedure: COLONOSCOPY WITH PROPOFOL;  Surgeon: [name], MD;  [facility];  Service: Endoscopy;  Laterality: N/A;   FOOT SURGERY  2015   GALLBLADDER SURGERY  2008   HARDWARE REMOVAL Left 02/14/2016   Procedure: LEFT FOOT REMOVAL DEEP IMPLANT;  Surgeon: [name], MD;  Location: [place];  Service: Orthopedics;  Laterality: Left;   HERNIA REPAIR     Inguinal Hernia Repair   SPINE SURGERY  2014    Family History  Problem Relation Age of Onset   Arthritis Mother    Hypertension Mother  Macular degeneration Mother    Hypertension Father    Hyperlipidemia Father    Heart disease Maternal Grandfather    Diabetes Maternal Grandfather    Kidney disease Paternal Grandmother     SOCIAL HX: reviewed.    Current Outpatient Medications:    furosemide (LASIX) 20 MG tablet, Take 1 tablet (20 mg total) by mouth daily., Disp: 30 tablet, Rfl: 3   acetaminophen (TYLENOL) 500 MG tablet, Take 1 tablet (500 mg total) by mouth every 4 (four) hours as needed., Disp: 30 tablet, Rfl: 0   albuterol (VENTOLIN HFA) 108 (90 Base) MCG/ACT inhaler, Inhale 2 puffs into the lungs  every 6 (six) hours as needed., Disp: 18 g, Rfl: 0   amLODipine (NORVASC) 5 MG tablet, TAKE 1 TABLET BY MOUTH DAILY., Disp: 90 tablet, Rfl: 1   Baclofen 5 MG TABS, Take 1 tablet by mouth 3 (three) times daily., Disp: , Rfl:    Cholecalciferol (D3-1000 PO), Take 2,000 Units by mouth daily., Disp: , Rfl:    clopidogrel (PLAVIX) 75 MG tablet, TAKE 1 TABLET (75 MG) BY MOUTH EVERY DAY, Disp: 90 tablet, Rfl: 1   Coenzyme Q10 (CO Q10) 100 MG CAPS, Take 1 capsule by mouth daily., Disp: , Rfl:    docusate sodium (COLACE) 100 MG capsule, Take 100 mg by mouth 2 (two) times daily., Disp: , Rfl:    DULoxetine (CYMBALTA) 60 MG capsule, TAKE 1 CAPSULE BY MOUTH DAILY., Disp: 90 capsule, Rfl: 1   fexofenadine (ALLEGRA ALLERGY) 180 MG tablet, Take 1 tablet (180 mg total) by mouth daily., Disp: 30 tablet, Rfl: 2   fluticasone (FLONASE) 50 MCG/ACT nasal spray, Place 1 spray into both nostrils daily., Disp: 16 g, Rfl: 2   losartan (COZAAR) 50 MG tablet, TAKE 1 TABLET BY MOUTH DAILY, Disp: 90 tablet, Rfl: 1   magnesium oxide (MAG-OX) 400 MG tablet, TAKE 1 TABLET BY MOUTH DAILY, Disp: 30 tablet, Rfl: 5   METAMUCIL FIBER PO, Take by mouth 2 (two) times daily., Disp: , Rfl:    mirtazapine (REMERON SOL-TAB) 30 MG disintegrating tablet, Take 1 tablet (30 mg total) by mouth at bedtime., Disp: 30 tablet, Rfl: 3   nystatin cream (MYCOSTATIN), Apply 1 application topically 2 (two) times daily., Disp: 30 g, Rfl: 11   omeprazole (PRILOSEC) 20 MG capsule, Take 20 mg by mouth 2 (two) times daily before a meal., Disp: , Rfl:    rosuvastatin (CRESTOR) 5 MG tablet, Take 1 tablet (5 mg total) by mouth daily., Disp: 90 tablet, Rfl: 1   saccharomyces boulardii (FLORASTOR) 250 MG capsule, Take 1 capsule (250 mg total) by mouth 2 (two) times daily., Disp: 60 capsule, Rfl: 0   SEROQUEL 25 MG tablet, Take 1 tablet (25 mg total) by mouth 4 (four) times daily as needed., Disp: 120 tablet, Rfl: 3   sodium chloride (OCEAN) 0.65 % nasal spray,  Place 1-2 sprays into the nose as needed., Disp: , Rfl:  No current facility-administered medications for this visit.  Facility-Administered Medications Ordered in Other Visits:    0.9 %  sodium chloride infusion, , Intravenous, Continuous, Corcoran, Melissa C, MD, Last Rate: 10 mL/hr at 04/25/16 0855, New Bag at 04/25/16 0855   acetaminophen (TYLENOL) tablet 650 mg, 650 mg, Oral, Once, Corcoran, Melissa C, MD  EXAM:  VITALS per patient if applicable: 315/17  GENERAL: alert, oriented, appears well and in no acute distress  HEENT: atraumatic, conjunttiva clear, no obvious abnormalities on inspection of external nose and ears  NECK:  normal movements of the head and neck  LUNGS: on inspection no signs of respiratory distress, breathing rate appears normal, no obvious gross SOB, gasping or wheezing  CV: no obvious cyanosis  PSYCH/NEURO: pleasant and cooperative, no obvious depression or anxiety, speech and thought processing grossly intact  ASSESSMENT AND PLAN:  Discussed the following assessment and plan:  Problem List Items Addressed This Visit     Colon polyp    Discussed colonoscopy.  Last colonoscopy 05/2017.  Recommended f/u in 05/2020.  S/p CVA so colonoscopy was postponed.   Has been > 1 year now.  Is on plavix.  See neurology note for recommendation regarding plavix.  Discussed possible risk of being off plavix.  Will need to resume as soon as possible post colonoscopy (per GI).  She plans to discuss with GI regarding previous aspiration with last colonoscopy.  Contact GI regarding f/u.       Constipation    Not an issue now.  Bowels doing better.  Follow.       Depression, recurrent ([place])    Followed by psychiatry.  On seroquel, remeron.  Follow.        Generalized anxiety disorder    Followed by psychiatry.        History of CVA (cerebrovascular accident)    Continue risk factor modification.  Appears to be tolerating statin.  Continue plavix.  Discussed and she  understands risk of stopping plavix for procedures.           Hypercholesterolemia    Continue crestor.  Low cholesterol diet and exercise.  Follow lipid panel and liver function tests.       Relevant Medications   furosemide (LASIX) 20 MG tablet   Hyperglycemia    Low-carb diet and exercise.  Follow metabolic panel and T5H.      Hypertension    Continue losartan and amlodipine.  Follow pressures.  Follow metabolic panel.       Relevant Medications   furosemide (LASIX) 20 MG tablet   Lower extremity edema    Was evaluated in lymphedema clinic.  Swelling is better.  Lymphedema pump.       Multiple sclerosis ([place])    Followed by neurology.  Continue exercises/therapy. Seeing Dr Felecia Shelling. Discussed wheelchair as outlined.  Feel would benefit as outlined.  Contact person - [employer] [phone].        Unsteady gait    Again, feel would benefit from motorized wheelchair as outlined.  Continue PT as well.        Return in about 8 weeks (around 02/07/2022) for follow up appt (62min).   I discussed the assessment and treatment plan with the patient. The patient was provided an opportunity to ask questions and all were answered. The patient agreed with the plan and demonstrated an understanding of the instructions.   The patient was advised to call back or seek an in-person evaluation if the symptoms worsen or if the condition fails to improve as anticipated.    Einar Pheasant, MD

## 2021-12-13 NOTE — Telephone Encounter (Signed)
Pt has appt today

## 2021-12-14 ENCOUNTER — Telehealth: Payer: Self-pay | Admitting: Internal Medicine

## 2021-12-14 ENCOUNTER — Encounter: Payer: Self-pay | Admitting: Internal Medicine

## 2021-12-14 MED ORDER — FUROSEMIDE 20 MG PO TABS: 20.0000 mg | ORAL_TABLET | Freq: Every day | ORAL | 3 refills | Status: DC

## 2021-12-14 NOTE — Assessment & Plan Note (Signed)
Was evaluated in lymphedema clinic.  Swelling is better.  Lymphedema pump.  ?

## 2021-12-14 NOTE — Assessment & Plan Note (Signed)
Followed by psychiatry 

## 2021-12-14 NOTE — Assessment & Plan Note (Signed)
Followed by psychiatry.  On seroquel, remeron.  Follow.   ?

## 2021-12-14 NOTE — Assessment & Plan Note (Signed)
Low-carb diet and exercise.  Follow metabolic panel and A1c. 

## 2021-12-14 NOTE — Assessment & Plan Note (Addendum)
Followed by neurology.  Continue exercises/therapy. Seeing Dr Felecia Shelling. Discussed wheelchair as outlined.  Feel would benefit as outlined.  Contact person - [employer] [phone].   ?

## 2021-12-14 NOTE — Assessment & Plan Note (Signed)
Continue risk factor modification.  Appears to be tolerating statin.  Continue plavix.  Discussed and she understands risk of stopping plavix for procedures.      ?

## 2021-12-14 NOTE — Assessment & Plan Note (Signed)
Again, feel would benefit from motorized wheelchair as outlined.  Continue PT as well.  ?

## 2021-12-14 NOTE — Assessment & Plan Note (Signed)
Continue losartan and amlodipine.  Follow pressures.  Follow metabolic panel.  

## 2021-12-14 NOTE — Assessment & Plan Note (Signed)
Not an issue now.  Bowels doing better.  Follow.  ?

## 2021-12-14 NOTE — Assessment & Plan Note (Signed)
Continue crestor.  Low cholesterol diet and exercise. Follow lipid panel and liver function tests.   

## 2021-12-14 NOTE — Telephone Encounter (Signed)
Had visit with  her Friday 12/13/21.  She was questioning info regarding her wheelchair.  States [employer] (wheelchair) - waiting on form.  Agent is [employer].  Phone number [phone].  Note is complete from that visit.  Also, she needs a f/u appt with me in 8 weeks.   ?

## 2021-12-14 NOTE — Assessment & Plan Note (Signed)
Discussed colonoscopy.  Last colonoscopy 05/2017.  Recommended f/u in 05/2020.  S/p CVA so colonoscopy was postponed.   Has been > 1 year now.  Is on plavix.  See neurology note for recommendation regarding plavix.  Discussed possible risk of being off plavix.  Will need to resume as soon as possible post colonoscopy (per GI).  She plans to discuss with GI regarding previous aspiration with last colonoscopy.  Contact GI regarding f/u.  ?

## 2021-12-16 DIAGNOSIS — H25811 Combined forms of age-related cataract, right eye: Secondary | ICD-10-CM | POA: Diagnosis not present

## 2021-12-16 DIAGNOSIS — H25812 Combined forms of age-related cataract, left eye: Secondary | ICD-10-CM | POA: Diagnosis not present

## 2021-12-16 DIAGNOSIS — Z01818 Encounter for other preprocedural examination: Secondary | ICD-10-CM | POA: Diagnosis not present

## 2021-12-17 ENCOUNTER — Ambulatory Visit: Payer: Medicare Other

## 2021-12-17 ENCOUNTER — Other Ambulatory Visit: Payer: Self-pay

## 2021-12-17 DIAGNOSIS — R278 Other lack of coordination: Secondary | ICD-10-CM

## 2021-12-17 DIAGNOSIS — R2689 Other abnormalities of gait and mobility: Secondary | ICD-10-CM

## 2021-12-17 DIAGNOSIS — R262 Difficulty in walking, not elsewhere classified: Secondary | ICD-10-CM | POA: Diagnosis not present

## 2021-12-17 DIAGNOSIS — R2681 Unsteadiness on feet: Secondary | ICD-10-CM

## 2021-12-17 DIAGNOSIS — M6281 Muscle weakness (generalized): Secondary | ICD-10-CM | POA: Diagnosis not present

## 2021-12-17 DIAGNOSIS — R482 Apraxia: Secondary | ICD-10-CM | POA: Diagnosis not present

## 2021-12-17 NOTE — Telephone Encounter (Signed)
Faxed to [employer] ?

## 2021-12-17 NOTE — Therapy (Signed)
[place] MAIN [facility] SERVICES [place], [place], [address] Phone: [phone]   Fax:  [phone]  Occupational Therapy Treatment  Patient Details  Name: Carly [name] MRN: [id number] Date of Birth: Feb 19, 1951 Referring Provider (OT): Dr. Einar Pheasant   Encounter Date: 12/17/2021   OT End of Session - 12/17/21 1452     Visit Number 4    Number of Visits 24    Date for OT Re-Evaluation 02/26/22    Authorization Type OT initial eval to address R hand weakness post CVA 12/05/21; OT initial eval for lymphedema management 06/12/21    OT Start Time 1120    OT Stop Time 1145    OT Time Calculation (min) 25 min    Equipment Utilized During Treatment transport chair    Activity Tolerance Patient tolerated treatment well    Behavior During Therapy WFL for tasks assessed/performed             Past Medical History:  Diagnosis Date   Allergy    Anxiety    Aspiration pneumonia ([place])    Depression    Frequent headaches    H/O   GERD (gastroesophageal reflux disease)    History of chicken pox    History of colon polyps    Hx of migraines    Multiple sclerosis ([place]) 2011   OSA (obstructive sleep apnea)    PONV (postoperative nausea and vomiting)     Past Surgical History:  Procedure Laterality Date   BACK SURGERY     CHOLECYSTECTOMY     COLONOSCOPY WITH PROPOFOL N/A 05/18/2017   Procedure: COLONOSCOPY WITH PROPOFOL;  Surgeon: [name], MD;  Location: [facility] ENDOSCOPY;  Service: Endoscopy;  Laterality: N/A;   FOOT SURGERY  2015   GALLBLADDER SURGERY  2008   HARDWARE REMOVAL Left 02/14/2016   Procedure: LEFT FOOT REMOVAL DEEP IMPLANT;  Surgeon: [name], MD;  Location: [facility];  Service: Orthopedics;  Laterality: Left;   HERNIA REPAIR     Inguinal Hernia Repair   SPINE SURGERY  2014    There were no vitals filed for this visit.   Subjective Assessment - 12/17/21 1450     Subjective  Pt  reports running late d/t having an upset stomach and needing to use the bathroom.    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 No/denies    Pain Score 0-No pain    Pain Onset More than a month ago   leg pain and swelling first noticed during long care trip ~ 7 hours           Occupational Therapy Treatment: Self Care: Participation in handwriting strategies.  Pt practiced printing and signing name with lined paper and use of pencil with built up rubber grip.  Pt required cues to utilize lines as visual for consistent letter height and was able to correct with cuing 50% of the time.  Instructed pt to loosen grip on pencil and focus more on fluidity of writing vs her focus on better legibility.  When pt  loosened her grip and let her pencil continuously move, legibility and writing was less effortful.  Provided written reminders at top of handwriting HEP to reinforce these strategies.    Therapeutic Exercise: Facilitated R hand strengthening with use of hand gripper set at moderate resistance with 1 green band to complete 3 sets 10 reps of hand squeezes.  Facilitated pinch strengthening with use of therapy resistant clothespins to target lateral and 3 point pinch of R/L hands.  Able to pinch yellow, red (light resistance), green, blue (moderate resistance) with good ability, but occasional assist to position fingers into 3pt pinch prehension pattern on clip.  Repeated trials to manage black clips (most resistant) with either hand.    Response to Treatment: Pt  reports good carryover with handwriting practice at home and presented her notebook to OT to show her work.  Improved ease of handwriting when pt was cued to loosen grip and focus on fluidity of pen vs legibility.  Pt requires cues for consistent letter height using lined paper.  Pt reports feeling theraputty has been helpful and will continue to use at home.  Pt will continue to benefit from skilled OT for increasing RUE strength and coordination for easier manipulation of ADL supplies and improved handwriting.     OT Education - 12/17/21 1451     Education Details handwriting exercises, lateral and 3pt pinching exericses    Person(s) Educated Patient    Methods Explanation;Demonstration;Tactile cues    Comprehension Verbalized understanding;Returned demonstration;Need further instruction;Verbal cues required;Tactile cues required              OT Short Term Goals - 12/06/21 1502       OT SHORT TERM GOAL #1   Title Pt will perform HEP for R hand strengthening and coordination with min vc.    Baseline Eval: initaited at eval, further training needed    Time 6    Period Weeks    Status New    Target Date 01/15/22               OT Long Term Goals - 12/06/21 1503       OT LONG TERM GOAL #7   Title Pt will write (cursive) 2-3 sentences with >75% legibility.    Baseline Eval: R hand fatigues with cursive signature and is 75% legible, extra time needed    Time 12    Period Weeks    Status New    Target Date 02/26/22      OT LONG TERM GOAL #8   Title Pt will increase grip strength by 5 or more lbs to ease holding and carrying ADL supplies in R dominant hand.    Baseline Eval: R grip 24 (L 32, non dominant)    Time 12    Period Weeks    Status New    Target Date 02/26/22      OT LONG TERM GOAL  #9   TITLE Pt will increase R hand FMC to enable pt to send texts pressing buttons with R dominant hand and >75% accuracy.    Baseline Eval: pt has spouse respond to her text  messages for her d/t poor accuracy with R dominant hand.    Time 12    Period Weeks    Status New    Target Date 02/26/22      OT LONG TERM GOAL  #10   TITLE Pt will be able to comb hair with R dominant hand.    Baseline Eval:  combing hair is difficult; caregiver combs hair    Time 12    Period Weeks    Status New    Target Date 02/26/22              Plan - 12/17/21 1514     Clinical Impression Statement Pt reports good carryover with handwriting practice at home and presented her notebook to OT to show her work.  Improved ease of handwriting when pt was cued to loosen grip and focus on fluidity of pen vs legibility.  Pt requires cues for consistent letter height using lined paper.  Pt reports feeling theraputty has been helpful and will continue to use at home.  Pt will continue to benefit from skilled OT for increasing RUE strength and coordination for easier manipulation of ADL supplies and improved handwriting.    OT Occupational Profile and History Detailed Assessment- Review of Records and additional review of physical, cognitive, psychosocial history related to current functional performance    Occupational performance deficits (Please refer to evaluation for details): ADL's;IADL's;Leisure    Body Structure / Function / Physical Skills ADL;Coordination;GMC;UE functional use;Balance;Flexibility;IADL;FMC;Dexterity;Strength    Rehab Potential Fair    Clinical Decision Making Several treatment options, min-mod task modification necessary    Comorbidities Affecting Occupational Performance: Presence of comorbidities impacting occupational performance    Comorbidities impacting occupational performance description: MS, anxiety, depression    Modification or Assistance to Complete Evaluation  Min-Moderate modification of tasks or assist with assess necessary to complete eval    OT Frequency 2x / week    OT Duration 12 weeks    OT Treatment/Interventions Self-care/ADL  training;Therapeutic exercise;Coping strategies training;Neuromuscular education;Patient/family education;Therapeutic activities;DME and/or AE instruction;Manual Therapy    Plan OT to address R hand weakness and lack of coordination currently impacting writing, phone use, and basic ADL performance.    OT Home Exercise Plan Instructed in handwriting exercises; issued handout    Consulted and Agree with Plan of Care Patient             Patient will benefit from skilled therapeutic intervention in order to improve the following deficits and impairments:   Body Structure / Function / Physical Skills: ADL, Coordination, GMC, UE functional use, Balance, Flexibility, IADL, FMC, Dexterity, Strength       Visit Diagnosis: Apraxia  Muscle weakness (generalized)  Other lack of coordination    Problem List Patient Active Problem List   Diagnosis Date Noted   Pre-op evaluation 08/31/2021   Rash 01/27/2021   History of CVA (cerebrovascular accident) 01/05/2021   PBA (pseudobulbar affect)    MS (multiple sclerosis) ([place])    Anxiety and depression    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   Generalized anxiety disorder 12/11/2020   Acute focal neurological deficit 12/08/2020   Edema 09/28/2020   Bloating 08/25/2020   Environmental allergies 07/17/2019   Hyperglycemia 07/17/2019   Cough 06/06/2019   Memory change 05/29/2019   Abnormal liver function tests 01/09/2019   Hemorrhoid 09/27/2018   Nausea 08/11/2018   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   Depression with anxiety 02/04/2018   Cognitive and behavioral changes 02/04/2018   Constipation 11/17/2017   Hx of adenomatous colonic polyps 06/06/2016   Lower extremity edema 01/13/2016   Bilateral ovarian cysts 10/16/2015   Health care maintenance 10/16/2015   Colon polyp 01/25/2015   Bone/cartilage disorder 01/25/2015   Headache 11/18/2014   Depression, recurrent ([place]) 11/18/2014   Greater tuberosity of humerus  fracture 11/18/2014   Multiple sclerosis ([place]) 11/18/2014  Unsteady gait 11/18/2014   Dizziness 11/18/2014   Inconclusive mammogram 03/15/2013   S/P lumbar spinal fusion 01/04/2013   Surgery, other elective 12/03/2012   DDD (degenerative disc disease), lumbosacral 10/15/2012   Back ache 09/06/2012   Hypertension 09/06/2012   Back pain 09/06/2012   Hypercholesterolemia 01/28/2012   Climacteric 01/28/2012   Routine general medical examination at a health care facility 01/28/2012   Avitaminosis D 01/28/2012   Leta Speller, MS, OTR/L  Darleene Cleaver, OT 12/17/2021, 3:15 PM  [place] MAIN [facility] SERVICES [address] [place], [place], [address] Phone: [phone]   Fax:  [phone]  Name: Carly [name] MRN: [id number] Date of Birth: [birth year]

## 2021-12-17 NOTE — Therapy (Addendum)
[place] MAIN [facility] SERVICES [address] [place], [place], [address] Phone: [phone]   Fax:  [phone]  Physical Therapy Treatment/RECERT  Patient Details  Name: Carly [name] MRN: [id number] Date of Birth: 07-09-1951 Referring Provider (PT): Dr. Einar Pheasant for wheelchair eval for 11/12/2021   Encounter Date: 12/17/2021   PT End of Session - 12/17/21 1148     Visit Number 25    Number of Visits 28    Date for PT Re-Evaluation 01/23/22    Authorization Time Period 08/08/21-10/31/20; Recert 02/14/6978- 4/80/1655    Progress Note Due on Visit 20    PT Start Time 1147    PT Stop Time 1226    PT Time Calculation (min) 39 min    Equipment Utilized During Treatment Gait belt    Activity Tolerance Patient tolerated treatment well;Patient limited by fatigue    Behavior During Therapy WFL for tasks assessed/performed             Past Medical History:  Diagnosis Date   Allergy    Anxiety    Aspiration pneumonia ([place])    Depression    Frequent headaches    H/O   GERD (gastroesophageal reflux disease)    History of chicken pox    History of colon polyps    Hx of migraines    Multiple sclerosis ([place]) 2011   OSA (obstructive sleep apnea)    PONV (postoperative nausea and vomiting)     Past Surgical History:  Procedure Laterality Date   BACK SURGERY     CHOLECYSTECTOMY     COLONOSCOPY WITH PROPOFOL N/A 05/18/2017   Procedure: COLONOSCOPY WITH PROPOFOL;  Surgeon: [name], MD;  Location: [facility] ENDOSCOPY;  Service: Endoscopy;  Laterality: N/A;   FOOT SURGERY  2015   GALLBLADDER SURGERY  2008   HARDWARE REMOVAL Left 02/14/2016   Procedure: LEFT FOOT REMOVAL DEEP IMPLANT;  Surgeon: [name], MD;  Location: [facility];  Service: Orthopedics;  Laterality: Left;   HERNIA REPAIR     Inguinal Hernia Repair   SPINE SURGERY  2014    There were no vitals filed for this visit.   Subjective Assessment -  12/17/21 1146     Subjective Pt reports she is feeling OK currently. She does report some R shoulder pain. Pt rates pain as 3/10.    Patient is accompained by: Family member    Pertinent History Patient is a 71 year old female referral for power wheelchair evaluation from Dr. Einar Pheasant. 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  Patient reports her current manual w/c is insufficient for her needs- She states she is unable to manuever independently. She reports multiple falls (3) in past 6 months.    Limitations Standing;Walking    How long can you sit comfortably? no issues    How long can you stand comfortably? 15 min.    How long can you walk comfortably? 10 min.    Patient Stated Goals Standing, walking, transfers    Currently in Pain? Yes    Pain Score 3     Pain Location Shoulder    Pain Orientation Right    Pain Onset More than a month ago   leg pain and swelling first noticed during long care trip ~ 7 hours            Interventions -  FOTO: 45  HEP: pt reports she is doing her leg exercises. She reports she didn't get anything done yesterday.  STS: Pushing off of arm-rests 4x, after warm-up pt only required CGA, first 2 reps min a. Pt rates medium Pull-to-stand from WC 1x CGA  Attempted 10MWT, pt with difficulty standing up to walker. Discontinued after multiple attempts. Pt reports increased fatigue today due to continued issues with moving to new home. Pt reports she had a very active day yesterday.  STS (pull to stand) 4x in // bars with 2x5 mini marches in place. BUE support on bars.  Seated: 3# weights donned each LE for the following- Kicking soccer ball to knock over cones x 4 rounds, focus on LLE Kicking soccer ball back and forth to PT focus on LLE; for increased amplitude/power, pt performs several reps PT supporting pt's LE on soccer ball hamstring  curls 12x each LE   Pt educated throughout session about proper posture and technique with exercises. Improved exercise technique, movement at target joints, use of target muscles after min to mod verbal, visual, tactile cues.     PT Education - 12/17/21 1147     Education provided Yes    Education Details exercise technique, goals    Person(s) Educated Patient    Methods Explanation;Demonstration;Verbal cues;Tactile cues    Comprehension Verbalized understanding;Returned demonstration;Need further instruction              PT Short Term Goals - 12/17/21 1144       PT SHORT TERM GOAL #1   Title Patient will be independent in home exercise program to improve strength/mobility for better functional independence with ADLs.    Baseline Pt does not have HEP; 09/25/2021- Patient reports compliant wth HEP consisting of LE strengthening.    Time 4    Period Weeks    Status Achieved    Target Date 09/05/21      PT SHORT TERM GOAL #2   Title Patient will increase FOTO score to equal to or greater than 4    to demonstrate statistically significant improvement in mobility and quality of life.    Baseline 28; 09/25/2021- will reassess next visit; 10/31/2021= 24; 2/14: 39    Time 6    Period Weeks    Status Achieved    Target Date 12/12/21      PT SHORT TERM GOAL #3   Title Pt and caregivers will understand PT recommendation and appropriate/safe use for wheelchair and seating for home use.    Baseline 11/12/2021- Patient is currently limited to manual w/c and difficulty with transfers and dependent on caregiver for navigation.; 2/14: Pt recently had WC eval. Pt verbalizes use of correct home equipment.    Time 1    Period Days    Status Achieved    Target Date 11/12/21               PT Long Term Goals - 12/17/21 1144       PT LONG TERM GOAL #1   Title Patient will be independent with advanced and progressive home program for strength and endurance in order to transition to  self management    Baseline Pt does not have current formal HEP; 09/25/2021- Patient does report compliance with initial HEP consisting on seated therex for LE strengthening. 10/31/2021= Patient is performing initial HEP with seated LE strengthening but will benefit from progressive strengthening including standing/walking/balance; 2/14: pt not consistently performing her current HEP; 3/7: pt not consistent but has  increased frequency of performing interventions. PT provides education on importance of HEP    Time 12    Period Weeks    Status On-going    Target Date 03/11/22      PT LONG TERM GOAL #2   Title pt's FOTO score will improve by 4 points or more indicating improved confidence with daily tasks at home    Baseline 28 at intake: 09/25/2021- Will assess next visit; 10/31/2021= 24; 2/14: 39; 3/7: 45    Time 12    Period Weeks    Status Achieved    Target Date 01/23/22      PT LONG TERM GOAL #3   Title Patient will deny any falls over past 6 weeks to demonstrate improved safety awareness at home and work.    Baseline Pt reports 3 falls in previous 6 months; 09/25/2021= No falls reported and patient has been focusing on walking  some in clinic.    Time 6    Period Weeks    Status Achieved      PT LONG TERM GOAL #4   Title Patient will perform sit to and from stand from her wheelchair with min assist and no upper extremity support in order to indicate improvement in function.    Baseline Patient requires mod assist from author and bilateral lower extremity support to transition to and from sitting. 09/25/2021- Patient continues to require repetitive VC, TC, and min physical assistance to stand as wel as B UE Support.  10/31/2021= Patient continues to present with LE weakness- requires BUE support on armrest as well as min physical assist to stand.; 2/14: Pt requires BUE support of of chair armrests and CGA from PT to complete STS; 3/7: Pt achieves CGA from [facility] by pulling and pushing to stand     Time 12    Period Weeks    Status On-going    Target Date 03/11/22      PT LONG TERM GOAL #5   Title Pt will increase 10MWT by at least 0.13 m/s in order to demonstrate clinically significant improvement in community ambulation.    Baseline 09/25/2021=10 MWT= 0.045 m/s using front wheeled walker. 10/31/2021= 0.056 m/s using front wheeled walker; 2/14: 0.06 m/s with front wheeled walker and CGA; 3/7: pt unable today, deferred (see note for details)    Time 12    Period Weeks    Status On-going    Target Date 03/11/22      PT LONG TERM GOAL #6   Title Patient will ambulate on level surfaces using front wheeled walker with CGA > 200 feet without rest break or loss of balance for improved ability for household ambulation.    Baseline 09/25/2021= Patient was able to walk 32 feet today with front wheeled walker, Min assist with close w/c follow with decreased heel to toe and decreased step length. 10/31/2021- Patient able to walk approx 45 feet with front wheeled walker with minimal step length and dragging right LE at times; 2/14: pt ambulates 57mon this date and reports fatigue. 3/7: deferred (see note)    Time 12    Period Weeks    Status On-going    Target Date 03/11/22                   Plan - 12/17/21 1713     Clinical Impression Statement Goals reassessed for recertification. 10MWT goal and ambulation goal deferred as pt limited by fatigue on this date (pt has previously ambulated with  RW in sessions). This is likely due to increased activity yesterday as pt continues to complete move into her new home. Pt overall has made gains this reporting period with improve strength necessary for safer transfer techniques. Pt now able to perform STS by pushing off of arm rests with only CGA. Patient's condition has the potential to improve in response to therapy. Maximum improvement is yet to be obtained. The anticipated improvement is attainable and reasonable in a generally predictable  time. The pt will benefit from further skilled PT to improve LE strength, mobility and balance.    Personal Factors and Comorbidities Age;Comorbidity 1;Comorbidity 2;Comorbidity 3+;Time since onset of injury/illness/exacerbation    Comorbidities Depression, anxiety, HTN, MS, DDD, GERD, Lymphedema    Examination-Activity Limitations Bed Mobility;Carry;Continence;Dressing;Hygiene/Grooming;Lift;Locomotion Level;Squat;Stairs;Stand;Toileting;Transfers    Examination-Participation Restrictions Cleaning;Community Activity;Laundry;Meal Prep;Shop    Stability/Clinical Decision Making Evolving/Moderate complexity    Rehab Potential Fair    Clinical Impairments Affecting Rehab Potential (+) family support, motivated (-) MS, decreased confidence, co morbidities    PT Frequency 2x / week    PT Duration 12 weeks    PT Treatment/Interventions Patient/family education;Gait training;Neuromuscular re-education;Therapeutic exercise;Manual techniques;ADLs/Self Care Home Management;Stair training;DME Instruction;Functional mobility training;Therapeutic activities;Balance training;Wheelchair mobility training;Energy conservation;Joint Manipulations;Cryotherapy;Electrical Stimulation;Moist Heat;Passive range of motion    PT Next Visit Plan gait training, LE  strength training, endurance training, continue POC as indicated    PT Home Exercise Plan Access Code: [email or web address]; no updates; 1/9: Access Code: [email or web address]; no updates    Consulted and Agree with Plan of Care Patient             Patient will benefit from skilled therapeutic intervention in order to improve the following deficits and impairments:  Decreased strength, Decreased balance, Decreased activity tolerance, Decreased endurance, Difficulty walking, Abnormal gait, Improper body mechanics, Decreased mobility, Hypomobility, Impaired flexibility, Impaired perceived functional ability, Decreased range of motion, Pain  Visit Diagnosis: Muscle weakness  (generalized)  Other abnormalities of gait and mobility  Other lack of coordination  Unsteadiness on feet     Problem List Patient Active Problem List   Diagnosis Date Noted   Pre-op evaluation 08/31/2021   Rash 01/27/2021   History of CVA (cerebrovascular accident) 01/05/2021   PBA (pseudobulbar affect)    MS (multiple sclerosis) (HCC)    Anxiety and depression    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   Depression with anxiety 02/04/2018   Cognitive and behavioral changes 02/04/2018   Constipation 11/17/2017   Hx of adenomatous colonic polyps 06/06/2016   Lower extremity edema 01/13/2016   Bilateral ovarian cysts 10/16/2015   Health care maintenance 10/16/2015   Colon polyp 01/25/2015   Bone/cartilage disorder 01/25/2015   Headache 11/18/2014   Depression, recurrent ([place]) 11/18/2014   Greater tuberosity of humerus fracture 11/18/2014   Multiple sclerosis ([place]) 11/18/2014   Unsteady gait 11/18/2014   Dizziness 11/18/2014   Inconclusive mammogram 03/15/2013   S/P lumbar spinal fusion 01/04/2013   Surgery, other elective 12/03/2012   DDD (degenerative disc disease), lumbosacral 10/15/2012   Back ache 09/06/2012   Hypertension 09/06/2012   Back pain 09/06/2012   Hypercholesterolemia 01/28/2012   Climacteric 01/28/2012  Routine general medical examination at a health care facility 01/28/2012   Avitaminosis D 01/28/2012    Zollie Pee, PT 12/17/2021, 5:21 PM  [place] MAIN [facility] SERVICES [address] [place], [place], [address] Phone:  [phone]   Fax:  [phone]  Name: LYSHA SCHRADE MRN: [id number] Date of Birth: Jun 02, 1951

## 2021-12-17 NOTE — Addendum Note (Signed)
Addended by: Zollie Pee on: 12/17/2021 05:23 PM ? ? Modules accepted: Orders ? ?

## 2021-12-18 ENCOUNTER — Encounter: Payer: Self-pay | Admitting: *Deleted

## 2021-12-18 ENCOUNTER — Telehealth: Payer: Self-pay | Admitting: Internal Medicine

## 2021-12-18 DIAGNOSIS — R051 Acute cough: Secondary | ICD-10-CM | POA: Diagnosis not present

## 2021-12-18 DIAGNOSIS — R059 Cough, unspecified: Secondary | ICD-10-CM | POA: Diagnosis not present

## 2021-12-18 DIAGNOSIS — Z20822 Contact with and (suspected) exposure to covid-19: Secondary | ICD-10-CM | POA: Diagnosis not present

## 2021-12-18 NOTE — Telephone Encounter (Signed)
Patient aware and still no symptoms says sh eis fatigued from caring for husband. ?

## 2021-12-18 NOTE — Telephone Encounter (Signed)
Pt called in stating that her husband have Covid. Pt stated she is worried that she may catch Covid from her husband. Pt would like to know what can she do to keep from catching Covid. Pt would like to know is there something she can take to help her. Pt requesting callback  ?

## 2021-12-18 NOTE — Telephone Encounter (Signed)
Vit C 1000 mg daily , Vitamin D3 4000 IU daily , Zinc 50 mg daily - this is a cocktail of vitamins we use when test positive for covid.  I am ok if she starts these now. Let us know if any problems or if develops symptoms.  ?   ?

## 2021-12-18 NOTE — Telephone Encounter (Signed)
Husband started having symptoms Monday. Tested positive Tuesday and today. Patient is not having any symptoms. She is wondering if there are vitamins or anything she can take to prevent or help with the severity of her symptoms since she has been exposed. Explained that antiviral treatment is used for positive patients.  ?

## 2021-12-19 ENCOUNTER — Encounter: Payer: Self-pay | Admitting: *Deleted

## 2021-12-19 ENCOUNTER — Encounter: Payer: BC Managed Care – PPO | Admitting: Psychology

## 2021-12-19 ENCOUNTER — Emergency Department
Admission: EM | Admit: 2021-12-19 | Discharge: 2021-12-19 | Disposition: A | Discharge status: Home / Self Care | Payer: Medicare Other | Attending: Emergency Medicine | Admitting: Emergency Medicine

## 2021-12-19 ENCOUNTER — Ambulatory Visit: Payer: Medicare Other

## 2021-12-19 ENCOUNTER — Other Ambulatory Visit: Payer: Self-pay

## 2021-12-19 ENCOUNTER — Emergency Department: Payer: Medicare Other

## 2021-12-19 DIAGNOSIS — R069 Unspecified abnormalities of breathing: Secondary | ICD-10-CM | POA: Diagnosis not present

## 2021-12-19 DIAGNOSIS — U071 COVID-19: Secondary | ICD-10-CM | POA: Diagnosis not present

## 2021-12-19 DIAGNOSIS — R531 Weakness: Secondary | ICD-10-CM | POA: Diagnosis not present

## 2021-12-19 LAB — CBC
HCT: 40.2 % (ref 36.0–46.0)
Hemoglobin: 12.7 g/dL (ref 12.0–15.0)
MCH: 26.3 pg (ref 26.0–34.0)
MCHC: 31.6 g/dL (ref 30.0–36.0)
MCV: 83.2 fL (ref 80.0–100.0)
Platelets: 268 10*3/uL (ref 150–400)
RBC: 4.83 MIL/uL (ref 3.87–5.11)
RDW: 14.8 % (ref 11.5–15.5)
WBC: 8.3 10*3/uL (ref 4.0–10.5)
nRBC: 0 % (ref 0.0–0.2)

## 2021-12-19 LAB — BASIC METABOLIC PANEL
Anion gap: 9 (ref 5–15)
BUN: 11 mg/dL (ref 8–23)
CO2: 25 mmol/L (ref 22–32)
Calcium: 9.3 mg/dL (ref 8.9–10.3)
Chloride: 105 mmol/L (ref 98–111)
Creatinine, Ser: 0.66 mg/dL (ref 0.44–1.00)
GFR, Estimated: >60 mL/min (ref >60)
Glucose, Bld: 149 mg/dL — ABNORMAL HIGH (ref 70–99)
Potassium: 4 mmol/L (ref 3.5–5.1)
Sodium: 139 mmol/L (ref 135–145)

## 2021-12-19 LAB — TROPONIN I (HIGH SENSITIVITY): Troponin I (High Sensitivity): 3 ng/L (ref <18)

## 2021-12-19 MED ORDER — ONDANSETRON HCL 4 MG PO TABS: 4.0000 mg | ORAL_TABLET | Freq: Three times a day (TID) | ORAL | 0 refills | Status: DC | PRN

## 2021-12-19 MED ORDER — NIRMATRELVIR/RITONAVIR (PAXLOVID)TABLET: 3.0000 | ORAL_TABLET | Freq: Two times a day (BID) | ORAL | 0 refills | Status: AC

## 2021-12-19 MED ORDER — NIRMATRELVIR/RITONAVIR (PAXLOVID)TABLET: 3.0000 | ORAL_TABLET | Freq: Two times a day (BID) | ORAL | 0 refills | Status: DC

## 2021-12-19 NOTE — ED Provider Notes (Signed)
? ?[facility] ?Provider Note ? ? ? Event Date/Time  ? First MD Initiated Contact with Patient 12/19/21 2159   ?  (approximate) ? ? ?History  ? ?Weakness ? ? ?HPI ? ?Carly [name] is a 71 y.o. female  who, per family medicine notes has history of GERD, migraines, MS, who presents to the emergency department today because of concerns for weakness in the setting of a recent positive COVID test.  Patient states her husband tested positive a few days ago.  A few days ago she did also start to feel weak.  She states she has had some nausea and had decreased oral intake today.  Patient has had cough but denies any shortness of breath.  She has not appreciated any fevers.  Has had myalgias.  States she took a home test that was positive today.  ? ? ?Physical Exam  ? ?Triage Vital Signs: ?ED Triage Vitals  ?Enc Vitals Group  ?   BP 12/19/21 2044 (!) 137/100  ?   Pulse Rate 12/19/21 2044 (!) 102  ?   Resp 12/19/21 2044 (!) 22  ?   Temp 12/19/21 2044 98.5 ?F (36.9 ?C)  ?   Temp Source 12/19/21 2044 Oral  ?   SpO2 12/19/21 2044 95 %  ?   Weight 12/19/21 2042 170 lb (77.1 kg)  ?   Height 12/19/21 2042 '5\' 3"'$  (1.6 m)  ?   Head Circumference --   ?   Peak Flow --   ?   Pain Score 12/19/21 2042 0  ? ?Most recent vital signs: ?Vitals:  ? 12/19/21 2044  ?BP: (!) 137/100  ?Pulse: (!) 102  ?Resp: (!) 22  ?Temp: 98.5 ?F (36.9 ?C)  ?SpO2: 95%  ? ?General: Awake, no distress.  ?CV:  Good peripheral perfusion.  ?Resp:  Normal effort. Lungs clear to auscultation. ?Abd:  No distention.  ? ? ? ?ED Results / Procedures / Treatments  ? ?Labs ?(all labs ordered are listed, but only abnormal results are displayed) ?Labs Reviewed  ?BASIC METABOLIC PANEL - Abnormal; Notable for the following components:  ?    Result Value  ? Glucose, Bld 149 (*)   ? All other components within normal limits  ?CBC  ?TROPONIN I (HIGH SENSITIVITY)  ?TROPONIN I (HIGH SENSITIVITY)  ? ? ?RADIOLOGY ?I independently interpreted and visualized  the CXR. My interpretation: No pneumonia. No pneumothorax. ?Radiology interpretation:  ?IMPRESSION:  ?Low lung volumes without acute abnormality.  ? ? ? ?PROCEDURES: ? ?Critical Care performed: No ? ?Procedures ? ? ?MEDICATIONS ORDERED IN ED: ?Medications - No data to display ? ? ?IMPRESSION / MDM / ASSESSMENT AND PLAN / ED COURSE  ?I reviewed the triage vital signs and the nursing notes. ?             ?               ? ?Differential diagnosis includes, but is not limited to, COVID, anemia, electrolyte abnormality. ? ?Patient presents with signs and symptoms consistent with COVID.  Patient states she did test positive for COVID.  Patient is not hypoxic here.  Blood work without any concerning anemia, leukocytosis or electrolyte abnormality.  I did discuss treatment with Paxlovid.  Will also give patient prescription for Zofran given concern for nausea. At this time given lack of concerning blood work findings, or hypoxia, do not feel patient requires admission. ? ? ? ?FINAL CLINICAL IMPRESSION(S) / ED DIAGNOSES  ? ?Final diagnoses:  ?  COVID-19  ? ? ? ?Rx / DC Orders  ? ?ED Discharge Orders   ? ?      Ordered  ?  nirmatrelvir/ritonavir EUA (PAXLOVID) 20 x 150 MG & 10 x '100MG'$  TABS  2 times daily,   Status:  Discontinued       ? 12/19/21 2218  ?  ondansetron (ZOFRAN) 4 MG tablet  Every 8 hours PRN       ? 12/19/21 2218  ?  nirmatrelvir/ritonavir EUA (PAXLOVID) 20 x 150 MG & 10 x '100MG'$  TABS  2 times daily       ? 12/19/21 2219  ? ?  ?  ? ?  ? ? ? ?Note:  This document was prepared using Dragon voice recognition software and may include unintentional dictation errors. ? ?  ?Nance Pear, MD ?12/19/21 2247 ? ?

## 2021-12-19 NOTE — Telephone Encounter (Signed)
Patient called stating she wants Paxlovid due to her testing positive for Covid. ?

## 2021-12-19 NOTE — ED Notes (Signed)
Pt states she has COVID. She tested positive at home today. Pt reports generalized weakness. ?

## 2021-12-19 NOTE — ED Triage Notes (Signed)
Pt brought in via ems from [place] with covid and weakness.  Denies chest pain or sob.  Pt alert.  Speech clear.  ?

## 2021-12-19 NOTE — Discharge Instructions (Signed)
Please seek medical attention for any high fevers, chest pain, shortness of breath, change in behavior, persistent vomiting, bloody stool or any other new or concerning symptoms.  

## 2021-12-20 NOTE — Telephone Encounter (Signed)
Pt went to ED yesterday and was prescribed Paxlovid.  ?

## 2021-12-20 NOTE — Telephone Encounter (Signed)
.  Patient was resting when I called but stated she was doing ok. Will let us know if they need anything, ?

## 2021-12-20 NOTE — Telephone Encounter (Signed)
Noted. Please call and confirm doing ok.  Keep [place] posted.   ?

## 2021-12-24 ENCOUNTER — Encounter: Payer: Medicare Other | Admitting: Occupational Therapy

## 2021-12-24 ENCOUNTER — Ambulatory Visit: Payer: Medicare Other

## 2021-12-26 ENCOUNTER — Encounter
Payer: BC Managed Care – PPO | Attending: Physical Medicine and Rehabilitation | Admitting: Physical Medicine and Rehabilitation

## 2021-12-26 ENCOUNTER — Other Ambulatory Visit: Payer: Self-pay

## 2021-12-26 ENCOUNTER — Encounter: Payer: Self-pay | Admitting: Hematology and Oncology

## 2021-12-26 ENCOUNTER — Ambulatory Visit: Payer: Medicare Other

## 2021-12-26 DIAGNOSIS — R5382 Chronic fatigue, unspecified: Secondary | ICD-10-CM | POA: Diagnosis not present

## 2021-12-26 MED ORDER — CYANOCOBALAMIN 1000 MCG/ML IJ SOLN: 1000.0000 ug | INTRAMUSCULAR | 3 refills | Status: DC

## 2021-12-26 NOTE — Progress Notes (Signed)
? ?Subjective:  ? ? Patient ID: Carly [name], female    DOB: 1951-08-14, 71 y.o.   MRN: [id number] ? ?HPI  ?An audio/video tele-health visit is felt to be the most appropriate encounter for this patient at this time. This is a follow up tele-visit via phone. The patient is at home. MD is at office. Prior to scheduling this appointment, our staff discussed the limitations of evaluation and management by telemedicine and the availability of in-person appointments. The patient expressed understanding and agreed to proceed.  ? ? Carly [name] is a 71 year old woman who was admitted to CIR with white matter periventricular CVA, presents for follow-up regarding anxiety,chronic fatigue, insomnia, OSA, and weight gain.  ?  ?1) Anxiety:  ?-her psychiatrist previously transitioned her from Seroquel back to benzodiazepine due to side effect profile of Seroquel, but this worsened her anxiety so we restarted it ?-she has been having a lot of anxiety related to her and her husband's move from their current home into a retirement home and she asks whether she may take the seroquel additionally as needed- once in the morning, once later in the day, and two at night ?-she is interested in following with a behavioral therapist ?-she and husband [name] were scared of side effects of Seroquel that were discussed- she has not noted any this far.  ?-she has been sleeping but her legs feel very stiff at night ?-she continues to take Cymbalta '60mg'$  ?-we tried weaning her off the mirtazepine but she experienced weakness and insomnia and wanted to restart- titrated back up to her former dose.  ?-she discussed with her husband, who is also present on this call today, and she would prefer to restart Seroquel and stop Lorazepam.  ?-she has not had any counseling and would like to restart this. She has an appointment with [name] in August and would like to start counseling earlier than this if possible.  ?-she loved speaking with Dr.  Sima Matas inpatient and would like to follow with him ?-she had symptoms of dizziness yesterday at the hairdresser and was asked to contact me regarding if this could be from the seroquel. She had had no other similar episodes since starting it.  ?-she has been sleeping well with the Seroquel at night.  ?-she would like a list of foods that can help anxiety.  ?-she asks about the dietary advice I gave her last visit regarding anxiety. ?-she has been using essential oil.  ?-the prednisone has caused weight gain.  ? ?2) MS: ?-has been having a lot of stiffness recently ?-EMS called twice due to difficulty getting her up off the floor ?-is receiving home therapy ?-she would like to see a different neurologist and asked for my recommendation ?  ?3) Lymphedema  ?-has significant bilateral lower extremity  ?-has greatly improved with lymphedema therapy ?-lasix '20mg'$  did not help enough.  ?-fluid has increased. ?-has ordered compression garments ?-does not eat processed foods or salt.  ?-she has ordered something from [facility] to give her lymphatic massage.  ?-improving but still very bothersome to her ?-discussed bloodwork today ? ?4) HTN: SBP has been well controlled, but still with high sytolics of 540G at times ? ?5) Decreased appetite:  ?-weaning off of [name] has caused decreased appetite and decreased energy.  ? ?6) She developed pain in right side, and not sure if this is due to topamax or the antibiotics she is on to treat a respiratory infection.  ? ?7) Insomnia:  ?-  sleeping poorly due to her CPAP mask ?-respiratory therapist will seeing her tomorrow.  ?-she asks about [facility].  ? ?8) Chest congestion ?-she has been having symptoms of congestion for greater than 3 months ?-Her father saw 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 She received a call to schedule an appointment with [name] office and is thankful for the referral.  ?-she  asks what else she can do to help with this ?-she has been through multiple rounds of antibiotics without benefit.  ?-also feeling diffuse joint aches.  ? ?9) GERD ?-has been experiencing after eating ? ?10) Sinus congestion ?-pleased that her CT results were normal.  ? ?11) OSA ?-she notes that she was found to have OSA on recent sleep study and she was tearful about this as she feels it is just another condition she has ?-she was surprised as she feels that she sleeps well.  ?-she has note been sleeping well while using the CPAP mask and was told that O2 is leaking ?-she is working with a respiratory therapist ?-she feels anxiety about whether she will get enough sleep tonight, she has only been sleeping about 4 hours since using the CPAP ?-she asks about [name] as she saw an ad for this on TV ? ?12) Chronic fatigue ?-she was diagnosed with COVID 9 days ago and her breathing has improved but she has been feeling very fatigued ?-she has been taking the N-acetyl-cysteine supplement ?-she asks about taking injetable B12 as this really helped her son ?-she has not been wearing her CPAP as she is waiting for a new mask ? ? ?Pain Inventory ?Average Pain 5 ?Pain Right Now 2 ?My pain is aching and no pain.  Sometimes spasm in legs at night because of MST ? ?In the last 24 hours, has pain interfered with the following? ?General activity 4 ?Relation with others 7 ?Enjoyment of life 5 ? ?What TIME of day is your pain at its worst? night ?Sleep (in general) Fair ? ?Pain is worse with: sitting and unsure ?Pain improves with: medication ?Relief from Meds: 5 ? ?Family History  ?Problem Relation Age of Onset  ? Arthritis Mother   ? Hypertension Mother   ? Macular degeneration Mother   ? Hypertension Father   ? Hyperlipidemia Father   ? Heart disease Maternal Grandfather   ? Diabetes Maternal Grandfather   ? Kidney disease Paternal Grandmother   ? ?Social History  ? ?Socioeconomic History  ? Marital status: Married  ?  Spouse  name: [name]  ? Number of children: 3  ? Years of education: 83  ? Highest education level: Associate degree: academic program  ?Occupational History  ? Not on file  ?Tobacco Use  ? Smoking status: Never  ? Smokeless tobacco: Never  ?Vaping Use  ? Vaping Use: Never used  ?Substance and Sexual Activity  ? Alcohol use: No  ?  Alcohol/week: 0.0 standard drinks  ? Drug use: No  ? Sexual activity: Not Currently  ?Other Topics Concern  ? Not on file  ?Social History Narrative  ? Lives with husband   ? Right handed  ? Caffeine: 1 cup of coffee in the AM, coke occa. Trying to come off caffeine   ? ?Social Determinants of Health  ? ?Financial Resource Strain: Not on file  ?Food Insecurity: Not on file  ?Transportation Needs: Not on file  ?Physical Activity: Not on file  ?Stress: Not on file  ?  Social Connections: Not on file  ? ?Past Surgical History:  ?Procedure Laterality Date  ? BACK SURGERY    ? CHOLECYSTECTOMY    ? COLONOSCOPY WITH PROPOFOL N/A 05/18/2017  ? Procedure: COLONOSCOPY WITH PROPOFOL;  Surgeon: [name], MD;  [facility] END[facility]COPY;  Service: Endoscopy;  Laterality: N/A;  ? FOOT SURGERY  2015  ? GALLBLADDER SURGERY  2008  ? HARDWARE REMOVAL Left 02/14/2016  ? Procedure: LEFT FOOT REMOVAL DEEP IMPLANT;  Surgeon: [name], MD;  Location: [place];  Service: Orthopedics;  Laterality: Left;  ? HERNIA REPAIR    ? Inguinal Hernia Repair  ? [place] SURGERY  2014  ? ?Past Surgical History:  ?Procedure Laterality Date  ? BACK SURGERY    ? CHOLECYSTECTOMY    ? COLONOSCOPY WITH PROPOFOL N/A 05/18/2017  ? Procedure: COLONOSCOPY WITH PROPOFOL;  Surgeon: [name], MD;  Location: [facility] ENDOSCOPY;  Service: Endoscopy;  Laterality: N/A;  ? FOOT SURGERY  2015  ? GALLBLADDER SURGERY  2008  ? HARDWARE REMOVAL Left 02/14/2016  ? Procedure: LEFT FOOT REMOVAL DEEP IMPLANT;  Surgeon: [name], MD;  Location: [place];  Service: Orthopedics;  Laterality: Left;  ? HERNIA REPAIR    ?  Inguinal Hernia Repair  ? [place] SURGERY  2014  ? ?Past Medical History:  ?Diagnosis Date  ? Allergy   ? Anxiety   ? Aspiration pneumonia ([place])   ? Depression   ? Frequent headaches   ? H/O  ? GERD (gastroesoph

## 2021-12-29 DIAGNOSIS — G4733 Obstructive sleep apnea (adult) (pediatric): Secondary | ICD-10-CM | POA: Diagnosis not present

## 2021-12-30 ENCOUNTER — Telehealth: Payer: Self-pay | Admitting: Internal Medicine

## 2021-12-30 NOTE — Telephone Encounter (Signed)
[name] from [employer], [phone]. Checking on forms that where faxed on 12/19/2021 for patients wheelchair. They have not received forms. ?

## 2021-12-31 ENCOUNTER — Ambulatory Visit: Payer: Medicare Other

## 2022-01-01 NOTE — Telephone Encounter (Signed)
Called and LM for [name] letting her know that I have not received. ?

## 2022-01-02 ENCOUNTER — Ambulatory Visit: Payer: Medicare Other

## 2022-01-02 ENCOUNTER — Encounter: Payer: Medicare Other | Admitting: Occupational Therapy

## 2022-01-06 ENCOUNTER — Ambulatory Visit: Payer: Medicare PPO

## 2022-01-06 DIAGNOSIS — M9901 Segmental and somatic dysfunction of cervical region: Secondary | ICD-10-CM | POA: Diagnosis not present

## 2022-01-06 DIAGNOSIS — M5412 Radiculopathy, cervical region: Secondary | ICD-10-CM | POA: Diagnosis not present

## 2022-01-06 DIAGNOSIS — M9900 Segmental and somatic dysfunction of head region: Secondary | ICD-10-CM | POA: Diagnosis not present

## 2022-01-06 DIAGNOSIS — M79601 Pain in right arm: Secondary | ICD-10-CM | POA: Diagnosis not present

## 2022-01-06 DIAGNOSIS — M79602 Pain in left arm: Secondary | ICD-10-CM | POA: Diagnosis not present

## 2022-01-06 DIAGNOSIS — R519 Headache, unspecified: Secondary | ICD-10-CM | POA: Diagnosis not present

## 2022-01-06 DIAGNOSIS — M542 Cervicalgia: Secondary | ICD-10-CM | POA: Diagnosis not present

## 2022-01-06 DIAGNOSIS — M5413 Radiculopathy, cervicothoracic region: Secondary | ICD-10-CM | POA: Diagnosis not present

## 2022-01-06 DIAGNOSIS — M7918 Myalgia, other site: Secondary | ICD-10-CM | POA: Diagnosis not present

## 2022-01-07 ENCOUNTER — Ambulatory Visit: Payer: Medicare Other | Admitting: Occupational Therapy

## 2022-01-07 ENCOUNTER — Ambulatory Visit: Payer: Medicare Other

## 2022-01-07 ENCOUNTER — Ambulatory Visit (INDEPENDENT_AMBULATORY_CARE_PROVIDER_SITE_OTHER): Payer: Medicare PPO

## 2022-01-07 VITALS — Ht 63.0 in | Wt 170.0 lb

## 2022-01-07 DIAGNOSIS — Z0000 Encounter for general adult medical examination without abnormal findings: Secondary | ICD-10-CM

## 2022-01-07 NOTE — Patient Instructions (Addendum)
?  Carly [name] , ?Thank you for taking time to come for your Medicare Wellness Visit. I appreciate your ongoing commitment to your health goals. Please review the following plan we discussed and let me know if I can assist you in the future.  ? ?These are the goals we discussed: ? Goals   ? ?  DIET - INCREASE WATER INTAKE   ?  Increase physical activity   ?  As tolerated ?  ? ?  ?  ?This is a list of the screening recommended for you and due dates:  ?Health Maintenance  ?Topic Date Due  ? Pneumonia Vaccine (3) 03/13/2022*  ? Complete foot exam   05/09/2022*  ? Tetanus Vaccine  01/08/2023*  ? Hemoglobin A1C  02/12/2022  ? Eye exam for diabetics  12/12/2022  ? Mammogram  08/17/2023  ? Colon Cancer Screening  05/19/2027  ? Flu Shot  Completed  ? DEXA scan (bone density measurement)  Completed  ? COVID-19 Vaccine  Completed  ? Hepatitis C Screening: USPSTF Recommendation to screen - Ages 21-79 yo.  Completed  ? Zoster (Shingles) Vaccine  Completed  ? HPV Vaccine  Aged Out  ?*Topic was postponed. The date shown is not the original due date.  ?  ?

## 2022-01-07 NOTE — Telephone Encounter (Signed)
Paper work receivedon 01/04/2022 from [employer], 28 pages. Placed in Dr [employer] mail box. ?

## 2022-01-07 NOTE — Progress Notes (Signed)
Subjective:   Alyce FLOREE LITWAK is a 71 y.o. female who presents for Medicare Annual (Subsequent) preventive examination.  Review of Systems    No ROS.  Medicare Wellness Virtual Visit.  Visual/audio telehealth visit, UTA vital signs.   See social history for additional risk factors.   Cardiac Risk Factors include: advanced age (>17men, >34 women)     Objective:    Today's Vitals   01/07/22 1507  Weight: 170 lb (77.1 kg)  Height: 5\' 3"  (1.6 m)   Body mass index is 30.11 kg/m.     01/07/2022    3:16 PM 12/19/2021    8:43 PM 11/05/2021   10:11 AM 08/08/2021    4:21 PM 08/08/2021    4:17 PM 06/12/2021    3:54 PM 12/13/2020    2:22 PM  Advanced Directives  Does Patient Have a Medical Advance Directive? Yes No Yes Yes Yes  Yes  Type of Estate agent of [place];Living will  Healthcare Power of [place];Living will;Out of facility DNR (pink MOST or yellow form)  Healthcare Power of [employer] Power of Attorney Living will  Does patient want to make changes to medical advance directive? No - Patient declined   No - Patient declined  No - Patient declined No - Patient declined  Copy of Healthcare Power of Attorney in Chart? Yes - validated most recent copy scanned in chart (See row information)          Current Medications (verified) Outpatient Encounter Medications as of 01/07/2022  Medication Sig   acetaminophen (TYLENOL) 500 MG tablet Take 1 tablet (500 mg total) by mouth every 4 (four) hours as needed.   albuterol (VENTOLIN HFA) 108 (90 Base) MCG/ACT inhaler Inhale 2 puffs into the lungs every 6 (six) hours as needed.   amLODipine (NORVASC) 5 MG tablet TAKE 1 TABLET BY MOUTH DAILY.   Baclofen 5 MG TABS Take 1 tablet by mouth 3 (three) times daily.   Cholecalciferol (D3-1000 PO) Take 2,000 Units by mouth daily.   clopidogrel (PLAVIX) 75 MG tablet TAKE 1 TABLET (75 MG) BY MOUTH EVERY DAY   Coenzyme Q10 (CO Q10) 100 MG CAPS Take 1 capsule by mouth  daily.   cyanocobalamin (,VITAMIN B-12,) 1000 MCG/ML injection Inject 1 mL (1,000 mcg total) into the muscle every 30 (thirty) days.   docusate sodium (COLACE) 100 MG capsule Take 100 mg by mouth 2 (two) times daily.   DULoxetine (CYMBALTA) 60 MG capsule TAKE 1 CAPSULE BY MOUTH DAILY.   fexofenadine (ALLEGRA ALLERGY) 180 MG tablet Take 1 tablet (180 mg total) by mouth daily.   fluticasone (FLONASE) 50 MCG/ACT nasal spray Place 1 spray into both nostrils daily.   furosemide (LASIX) 20 MG tablet Take 1 tablet (20 mg total) by mouth daily.   losartan (COZAAR) 50 MG tablet TAKE 1 TABLET BY MOUTH DAILY   magnesium oxide (MAG-OX) 400 MG tablet TAKE 1 TABLET BY MOUTH DAILY   METAMUCIL FIBER PO Take by mouth 2 (two) times daily.   mirtazapine (REMERON SOL-TAB) 30 MG disintegrating tablet Take 1 tablet (30 mg total) by mouth at bedtime.   nystatin cream (MYCOSTATIN) Apply 1 application topically 2 (two) times daily.   omeprazole (PRILOSEC) 20 MG capsule Take 20 mg by mouth 2 (two) times daily before a meal.   ondansetron (ZOFRAN) 4 MG tablet Take 1 tablet (4 mg total) by mouth every 8 (eight) hours as needed.   rosuvastatin (CRESTOR) 5 MG tablet Take 1 tablet (5  mg total) by mouth daily.   saccharomyces boulardii (FLORASTOR) 250 MG capsule Take 1 capsule (250 mg total) by mouth 2 (two) times daily.   SEROQUEL 25 MG tablet Take 1 tablet (25 mg total) by mouth 4 (four) times daily as needed.   sodium chloride (OCEAN) 0.65 % nasal spray Place 1-2 sprays into the nose as needed.   Facility-Administered Encounter Medications as of 01/07/2022  Medication   0.9 %  sodium chloride infusion   acetaminophen (TYLENOL) tablet 650 mg    Allergies (verified) Ibuprofen, Sulfamethoxazole-trimethoprim, Penicillin g, Asa [aspirin], Buspirone, Levofloxacin, Lexapro [escitalopram oxalate], Pollen extract, Ceftin [cefuroxime axetil], Penicillins, and Sulfa antibiotics   History: Past Medical History:  Diagnosis Date    Allergy    Anxiety    Aspiration pneumonia (HCC)    Depression    Frequent headaches    H/O   GERD (gastroesophageal reflux disease)    History of chicken pox    History of colon polyps    Hx of migraines    Multiple sclerosis (HCC) 2011   OSA (obstructive sleep apnea)    PONV (postoperative nausea and vomiting)    Past Surgical History:  Procedure Laterality Date   BACK SURGERY     CHOLECYSTECTOMY     COLONOSCOPY WITH PROPOFOL N/A 05/18/2017   Procedure: COLONOSCOPY WITH PROPOFOL;  Surgeon: Scot Jun, MD;  Location[facility] ENDOSCOPY;  Service: Endoscopy;  Laterality: N/A;   FOOT SURGERY  2015   GALLBLADDER SURGERY  2008   HARDWARE REMOVAL Left 02/14/2016   Procedure: LEFT FOOT REMOVAL DEEP IMPLANT;  Surgeon: Toni Arthurs, MD;  Location: [facility];  Service: Orthopedics;  Laterality: Left;   HERNIA REPAIR     Inguinal Hernia Repair   SPINE SURGERY  2014   Family History  Problem Relation Age of Onset   Arthritis Mother    Hypertension Mother    Macular degeneration Mother    Hypertension Father    Hyperlipidemia Father    Heart disease Maternal Grandfather    Diabetes Maternal Grandfather    Kidney disease Paternal Grandmother    Social History   Socioeconomic History   Marital status: Married    Spouse name: Optometrist   Number of children: 3   Years of education: 14   Highest education level: Associate degree: academic program  Occupational History   Not on file  Tobacco Use   Smoking status: Never   Smokeless tobacco: Never  Vaping Use   Vaping Use: Never used  Substance and Sexual Activity   Alcohol use: No    Alcohol/week: 0.0 standard drinks   Drug use: No   Sexual activity: Not Currently  Other Topics Concern   Not on file  Social History Narrative   Lives with husband    Right handed   Caffeine: 1 cup of coffee in the AM, coke occa. Trying to come off caffeine    Social Determinants of Health   Financial Resource Strain: Low  Risk    Difficulty of Paying Living Expenses: Not hard at all  Food Insecurity: No Food Insecurity   Worried About Programme researcher, broadcasting/film/video in the Last Year: Never true   Ran Out of Food in the Last Year: Never true  Transportation Needs: No Transportation Needs   Lack of Transportation (Medical): No   Lack of Transportation (Non-Medical): No  Physical Activity: Insufficiently Active   Days of Exercise per Week: 2 days   Minutes of Exercise per Session: 60  min  Stress: No Stress Concern Present   Feeling of Stress : Not at all  Social Connections: Unknown   Frequency of Communication with Friends and Family: Not on file   Frequency of Social Gatherings with Friends and Family: Not on file   Attends Religious Services: Not on file   Active Member of Clubs or Organizations: Not on file   Attends Banker Meetings: Not on file   Marital Status: Married    Tobacco Counseling Counseling given: Not Answered   Clinical Intake:  Pre-visit preparation completed: Yes        Diabetes: No  How often do you need to have someone help you when you read instructions, pamphlets, or other written materials from your doctor or pharmacy?: 3 - Sometimes   Interpreter Needed?: No      Activities of Daily Living    01/07/2022    3:08 PM  In your present state of health, do you have any difficulty performing the following activities:  Hearing? 0  Vision? 0  Difficulty concentrating or making decisions? 0  Walking or climbing stairs? 1  Comment Walker in use  Dressing or bathing? 1  Comment Caregiver assist  Doing errands, shopping? 1  Preparing Food and eating ? Y  Comment Husband assist  Using the Toilet? N  In the past six months, have you accidently leaked urine? N  Do you have problems with loss of bowel control? Y  Managing your Medications? Y  Comment Husband assist  Managing your Finances? Y  Comment Husband assist  Housekeeping or managing your Housekeeping? Y   Comment Husband assist    Patient Care Team: Dale Hum[place]arf, MD as PCP - General (Internal Medicine) Christoper Allegra., MD as Referring Physician (Psychiatry)  Indicate any recent Medical Services you may have received from other than [facility] providers in the past year (date may be approximate).     Assessment:   This is a routine wellness examination for Milca.  Virtual Visit via Telephone Note  I connected with  Ponciano Ort on 01/07/22 at  3:00 PM EDT by telephone and verified that I am speaking with the correct person using two identifiers.  Persons participating in the virtual visit: patient/Nurse Health Advisor   I discussed the limitations of performing an evaluation and management service by telehealth. The patient expressed understanding and agreed to proceed. We continued and completed visit with audio only. Some vital signs may be absent or patient reported.   Hearing/Vision screen Hearing Screening - Comments:: Patient is able to hear conversational tones without difficulty. No issues reported. Vision Screening - Comments:: Followed by [facility]  Wears corrective lenses  They have regular follow up with the ophthalmologist  Dietary issues and exercise activities discussed: Current Exercise Habits: Home exercise routine, Intensity: Mild Healthy diet    Goals Addressed             This Visit's Progress    DIET - INCREASE WATER INTAKE       Increase physical activity       As tolerated       Depression Screen    01/07/2022    3:15 PM 11/05/2021   10:10 AM 08/27/2021   11:12 AM 08/15/2021   10:50 AM 07/19/2021    3:28 PM 06/18/2021    9:24 AM 05/31/2021    1:47 PM  PHQ 2/9 Scores  PHQ - 2 Score 0 1 4 0 2 6 6  Fall Risk    01/07/2022    3:17 PM 11/05/2021   10:10 AM 08/27/2021   11:11 AM 08/15/2021   10:50 AM 06/18/2021    9:23 AM  Fall Risk   Falls in the past year? 0 1 1 0 1  Number falls in past yr: 0 0 1 0 1  Comment      last in July 2022  Injury with Fall?  0 0 0 0  Risk for fall due to :    No Fall Risks History of fall(s)  Follow up Falls evaluation completed  Falls evaluation completed Falls evaluation completed     FALL RISK PREVENTION PERTAINING TO THE HOME:  Home free of loose throw rugs in walkways, pet beds, electrical cords, etc? Yes  Adequate lighting in your home to reduce risk of falls? Yes   ASSISTIVE DEVICES UTILIZED TO PREVENT FALLS: Use of a cane, walker or w/c? Yes   TIMED UP AND GO: Was the test performed? No .   Cognitive Function:    03/10/2017    9:46 AM  MMSE - Mini Mental State Exam  Orientation to time 5  Orientation to Place 5  Registration 3  Attention/ Calculation 5  Recall 3  Language- name 2 objects 2  Language- repeat 1  Language- follow 3 step command 3  Language- read & follow direction 1  Write a sentence 1  Copy design 1  Total score 30        01/07/2022    3:11 PM  6CIT Screen  What Year? 4 points  What month? 0 points  What time? 0 points  Count back from 20 0 points  Months in reverse 2 points  Repeat phrase 4 points  Total Score 10 points    Immunizations Immunization History  Administered Date(s) Administered   Fluad Quad(high Dose 65+) 07/08/2019, 08/15/2021   Influenza, High Dose Seasonal PF 06/17/2018   Influenza-Unspecified 09/03/2012, 08/27/2014, 08/14/2015, 08/07/2020   PFIZER Comirnaty(Gray Top)Covid-19 Tri-Sucrose Vaccine 11/11/2019, 12/02/2019   PFIZER(Purple Top)SARS-COV-2 Vaccination 11/11/2019, 12/02/2019, 07/20/2020, 05/17/2021   Pneumococcal Conjugate-13 12/15/2016   Pneumococcal Polysaccharide-23 01/28/2012   Zoster Recombinat (Shingrix) 08/11/2019, 10/15/2019    TDAP status: Due, Education has been provided regarding the importance of this vaccine. Advised may receive this vaccine at local pharmacy or Health Dept. Aware to provide a copy of the vaccination record if obtained from local pharmacy or Health Dept.  Verbalized acceptance and understanding. Deferred.   Pneumococcal vaccine status: Due, Education has been provided regarding the importance of this vaccine. Advised may receive this vaccine at local pharmacy or Health Dept. Aware to provide a copy of the vaccination record if obtained from local pharmacy or Health Dept. Verbalized acceptance and understanding. Deferred.   Screening Tests Health Maintenance  Topic Date Due   Pneumonia Vaccine 56+ Years old (3) 03/13/2022 (Originally 12/15/2017)   FOOT EXAM  05/09/2022 (Originally 07/15/1961)   TETANUS/TDAP  01/08/2023 (Originally [birth year]/1969)   HEMOGLOBIN A1C  02/12/2022   OPHTHALMOLOGY EXAM  12/12/2022   MAMMOGRAM  08/17/2023   COLONOSCOPY (Pts 45-28yrs Insurance coverage will need to be confirmed)  05/19/2027   INFLUENZA VACCINE  Completed   DEXA SCAN  Completed   COVID-19 Vaccine  Completed   Hepatitis C Screening  Completed   Zoster Vaccines- Shingrix  Completed   HPV VACCINES  Aged Out   Health Maintenance There are no preventive care reminders to display for this patient.  Dexa Scan- plans to schedule  Lung  Cancer Screening: (Low Dose CT Chest recommended if Age 31-80 years, 30 pack-year currently smoking OR have quit w/in 15years.) does not qualify.   Vision Screening: Recommended annual ophthalmology exams for early detection of glaucoma and other disorders of the eye.  Dental Screening: Recommended annual dental exams for proper oral hygiene  Community Resource Referral / Chronic Care Management: CRR required this visit?  No   CCM required this visit?  No      Plan:   Keep all routine maintenance appointments.   I have personally reviewed and noted the following in the patient's chart:   Medical and social history Use of alcohol, tobacco or illicit drugs  Current medications and supplements including opioid prescriptions.  Functional ability and status Nutritional status Physical activity Advanced directives List  of other physicians Hospitalizations, surgeries, and ER visits in previous 12 months Vitals Screenings to include cognitive, depression, and falls Referrals and appointments  In addition, I have reviewed and discussed with patient certain preventive protocols, quality metrics, and best practice recommendations. A written personalized care plan for preventive services as well as general preventive health recommendations were provided to patient.     Ashok Pall, LPN   1/61/0960

## 2022-01-08 NOTE — Telephone Encounter (Signed)
Pulled pages that require signature and placed on top of PT evaluation. Placed in your quick sign ?

## 2022-01-08 NOTE — Telephone Encounter (Signed)
Signed and placed in box.   

## 2022-01-09 ENCOUNTER — Encounter: Payer: Self-pay | Admitting: Occupational Therapy

## 2022-01-09 ENCOUNTER — Ambulatory Visit: Payer: Medicare Other | Admitting: Physical Therapy

## 2022-01-09 ENCOUNTER — Encounter: Payer: Self-pay | Admitting: Physical Therapy

## 2022-01-09 ENCOUNTER — Encounter: Payer: Medicare Other | Admitting: Occupational Therapy

## 2022-01-09 ENCOUNTER — Ambulatory Visit: Payer: Medicare Other | Admitting: Occupational Therapy

## 2022-01-09 VITALS — BP 124/67 | HR 80

## 2022-01-09 DIAGNOSIS — R2681 Unsteadiness on feet: Secondary | ICD-10-CM | POA: Diagnosis not present

## 2022-01-09 DIAGNOSIS — R482 Apraxia: Secondary | ICD-10-CM | POA: Diagnosis not present

## 2022-01-09 DIAGNOSIS — M6281 Muscle weakness (generalized): Secondary | ICD-10-CM | POA: Diagnosis not present

## 2022-01-09 DIAGNOSIS — R2689 Other abnormalities of gait and mobility: Secondary | ICD-10-CM

## 2022-01-09 DIAGNOSIS — R262 Difficulty in walking, not elsewhere classified: Secondary | ICD-10-CM | POA: Diagnosis not present

## 2022-01-09 DIAGNOSIS — R278 Other lack of coordination: Secondary | ICD-10-CM

## 2022-01-09 NOTE — Therapy (Signed)
[place] [facility] ?[place], [place], [address] ?Phone: [phone]   Fax:  [phone] ? ?Occupational Therapy Treatment ? ?Patient Details  ?Name: Carly [name] ?MRN: [id number] ?Date of Birth: 1951/03/05 ?Referring Provider (OT): Dr. Einar Pheasant ? ? ?Encounter Date: 01/09/2022 ? ? OT End of Session - 01/09/22 1219   ? ? Visit Number 5   ? Number of Visits 24   ? Date for OT Re-Evaluation 02/26/22   ? Authorization Type OT initial eval to address R hand weakness post CVA 12/05/21; OT initial eval for lymphedema management 06/12/21   ? OT Start Time 1107   ? OT Stop Time 1145   ? OT Time Calculation (min) 38 min   ? Activity Tolerance Patient tolerated treatment well   ? Behavior During Therapy [facility] for tasks assessed/performed   ? ?  ?  ? ?  ? ? ?Past Medical History:  ?Diagnosis Date  ? Allergy   ? Anxiety   ? Aspiration pneumonia ([place])   ? Depression   ? Frequent headaches   ? H/O  ? GERD (gastroesophageal reflux disease)   ? History of chicken pox   ? History of colon polyps   ? Hx of migraines   ? Multiple sclerosis ([place]) 2011  ? OSA (obstructive sleep apnea)   ? PONV (postoperative nausea and vomiting)   ? ? ?Past Surgical History:  ?Procedure Laterality Date  ? BACK SURGERY    ? CHOLECYSTECTOMY    ? COLONOSCOPY WITH PROPOFOL N/A 05/18/2017  ? Procedure: COLONOSCOPY WITH PROPOFOL;  Surgeon: [name], MD;  Location: [facility] ENDOSCOPY;  Service: Endoscopy;  Laterality: N/A;  ? FOOT SURGERY  2015  ? GALLBLADDER SURGERY  2008  ? HARDWARE REMOVAL Left 02/14/2016  ? Procedure: LEFT FOOT REMOVAL DEEP IMPLANT;  Surgeon: [name], MD;  Location: [facility];  Service: Orthopedics;  Laterality: Left;  ? HERNIA REPAIR    ? Inguinal Hernia Repair  ? [place] SURGERY  2014  ? ? ?There were no vitals filed for this visit. ? ? Subjective Assessment - 01/09/22 1214   ? ? Subjective  Pt reports that she may have to transition to Home health  therapy through [facility] for awhile.   ? Patient is accompanied by: Family member   ? 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 Limitations difficulty walking, unsteady gait, chronic leg pain and swelling, back pain, decreased balance, impaired functional R hand use, weakness   ? Currently in Pain? No/denies   ? ?  ?  ? ?  ? ? ?Pt. has returned to the clinic after being out due to [place]. Pt. reports that she may have to transition therapy to [place] through [facility]. Pt. Reports that she has not been able to work on her writing skills at all. Pt. Reports Nausea today, and feeling hot. The temperature was adjusted in the therapy clinic. Pt. presents with decreased activity tolerance, impaired strength, and  impaired coordination skills which limits her ability to perform ADL tasks, and writing skills. Pt. was able to write short words legibly, however presented with decreased legibility for the end of larger words, and sentences with her letters becoming smaller, and spacing closer together as she became more fatigued. Pt. education was provided about weightbearing, and proprioception through her right hand through her  right hand, finger flicks, and thumb opposition stretches when her right hand fatigues, and tightens.  ? ? ? ? ? ? ? ? ? ? ? ? ? ? ? ? ? ? ? ? OT Education - 01/09/22 1219   ? ? Education Details handwriting exercises, lateral and 3pt pinching exericses   ? Person(s) Educated Patient   ? Methods Explanation;Demonstration;Tactile cues   ? Comprehension Verbalized understanding;Returned demonstration;Need further instruction;Verbal cues required;Tactile cues required   ? ?  ?  ? ?  ? ? ? OT Short Term Goals - 12/06/21 1502   ? ?  ? OT SHORT TERM GOAL #1  ? Title Pt will perform HEP for R hand strengthening and coordination with min vc.   ? Baseline Eval:  initaited at eval, further training needed   ? Time 6   ? Period Weeks   ? Status New   ? Target Date 01/15/22   ? ?  ?  ? ?  ? ? ? ? OT Long Term Goals - 12/06/21 1503   ? ?  ? OT LONG TERM GOAL #7  ? Title Pt will write (cursive) 2-3 sentences with >75% legibility.   ? Baseline Eval: R hand fatigues with cursive signature and is 75% legible, extra time needed   ? Time 12   ? Period Weeks   ? Status New   ? Target Date 02/26/22   ?  ? OT LONG TERM GOAL #8  ? Title Pt will increase grip strength by 5 or more lbs to ease holding and carrying ADL supplies in R dominant hand.   ? Baseline Eval: R grip 24 (L 32, non dominant)   ? Time 12   ? Period Weeks   ? Status New   ? Target Date 02/26/22   ?  ? OT LONG TERM GOAL  #9  ? TITLE Pt will increase R hand FMC to enable pt to send texts pressing buttons with R dominant hand and >75% accuracy.   ? Baseline Eval: pt has spouse respond to her text messages for her d/t poor accuracy with R dominant hand.   ? Time 12   ? Period Weeks   ? Status New   ? Target Date 02/26/22   ?  ? OT LONG TERM GOAL  #10  ? TITLE Pt will be able to comb hair with R dominant hand.   ? Baseline Eval: combing hair is difficult; caregiver combs hair   ? Time 12   ? Period Weeks   ? Status New   ? Target Date 02/26/22   ? ?  ?  ? ?  ? ? ? ? ? ? ? ? Plan - 01/09/22 1225   ? ? Clinical Impression Statement Pt. has returned to the clinic after being out due to [place]. Pt. reports that she may have to transition therapy to [place] through [facility]. Pt. Reports that she has not been able to work on her writing skills at all. Pt. Reports Nausea today, and feeling hot. The temperature was adjusted in the therapy clinic. Pt. presents with decreased activity tolerance, impaired strength, and  impaired coordination skills which limits her ability to perform ADL tasks, and writing skills. Pt. was able to write short words legibly, however presented with decreased legibility for the end of  larger words, and sentences with her letters becoming smaller, and spacing closer together as she became more fatigued. Pt. education was provided about weightbearing,  and proprioception through her right hand through her right hand, finger flicks, and thumb opposition stretches when her right hand fatigues, and tightens.  ?  ? OT Occupational Profile and History Detailed Assessment- Review of Records and additional review of physical, cognitive, psychosocial history related to current functional performance   ? Occupational performance deficits (Please refer to evaluation for details): ADL's;IADL's;Leisure   ? Body Structure / Function / Physical Skills ADL;Coordination;GMC;UE functional use;Balance;Flexibility;IADL;FMC;Dexterity;Strength   ? Rehab Potential Fair   ? Clinical Decision Making Several treatment options, min-mod task modification necessary   ? Comorbidities Affecting Occupational Performance: Presence of comorbidities impacting occupational performance   ? Comorbidities impacting occupational performance description: MS, anxiety, depression   ? Modification or Assistance to Complete Evaluation  Min-Moderate modification of tasks or assist with assess necessary to complete eval   ? OT Frequency 2x / week   ? OT Duration 12 weeks   ? OT Treatment/Interventions Self-care/ADL training;Therapeutic exercise;Coping strategies training;Neuromuscular education;Patient/family education;Therapeutic activities;DME and/or AE instruction;Manual Therapy   ? Plan OT to address R hand weakness and lack of coordination currently impacting writing, phone use, and basic ADL performance.   ? OT Home Exercise Plan Instructed in handwriting exercises; issued handout   ? Consulted and Agree with Plan of Care Patient   ? ?  ?  ? ?  ? ? ?Patient will benefit from skilled therapeutic intervention in order to improve the following deficits and impairments:   ?Body Structure / Function / Physical Skills: ADL, Coordination, GMC,  UE functional use, Balance, Flexibility, IADL, FMC, Dexterity, Strength ?  ?  ? ? ?Visit Diagnosis: ?Muscle weakness (generalized) ? ?Other lack of coordination ? ? ? ?Problem List ?Patient Active Problem

## 2022-01-09 NOTE — Therapy (Signed)
[place] [facility] ?[place], [place], [address] ?Phone: [phone]   Fax:  [phone] ? ?Physical Therapy Treatment ? ?Patient Details  ?Name: Carly [name] ?MRN: [id number] ?Date of Birth: May 30, 1951 ?Referring Provider (PT): Dr. Einar Pheasant for wheelchair eval for 11/12/2021 ? ? ?Encounter Date: 01/09/2022 ? ? PT End of Session - 01/09/22 1154   ? ? Visit Number 26   ? Number of Visits 49   ? Date for PT Re-Evaluation 01/23/22   ? Authorization Time Period 08/08/21-10/31/20; Recert 10/31/4172- 0/81/4481   ? Progress Note Due on Visit 20   ? PT Start Time 1146   ? PT Stop Time 1230   ? PT Time Calculation (min) 44 min   ? Equipment Utilized During Treatment Gait belt   ? Activity Tolerance Patient limited by fatigue   ? Behavior During Therapy [facility] for tasks assessed/performed   ? ?  ?  ? ?  ? ? ?Past Medical History:  ?Diagnosis Date  ? Allergy   ? Anxiety   ? Aspiration pneumonia ([place])   ? Depression   ? Frequent headaches   ? H/O  ? GERD (gastroesophageal reflux disease)   ? History of chicken pox   ? History of colon polyps   ? Hx of migraines   ? Multiple sclerosis ([place]) 2011  ? OSA (obstructive sleep apnea)   ? PONV (postoperative nausea and vomiting)   ? ? ?Past Surgical History:  ?Procedure Laterality Date  ? BACK SURGERY    ? CHOLECYSTECTOMY    ? COLONOSCOPY WITH PROPOFOL N/A 05/18/2017  ? Procedure: COLONOSCOPY WITH PROPOFOL;  Surgeon: [name], MD;  [facility] ENDOSCOPY;  Service: Endoscopy;  Laterality: N/A;  ? FOOT SURGERY  2015  ? GALLBLADDER SURGERY  2008  ? HARDWARE REMOVAL Left 02/14/2016  ? Procedure: LEFT FOOT REMOVAL DEEP IMPLANT;  Surgeon: [name], MD;  Location: [facility];  Service: Orthopedics;  Laterality: Left;  ? HERNIA REPAIR    ? Inguinal Hernia Repair  ? [place] SURGERY  2014  ? ? ?Vitals:  ? 01/09/22 1155  ?BP: 124/67  ?Pulse: 80  ? ? ? Subjective Assessment - 01/09/22 1150   ? ? Subjective  "I have been sick with COVID and just haven't been feeling good." She denies any pain; She reports feeling hot and fatigued;   ? Patient is accompained by: Family member   ? Pertinent History Patient is a 71 year old female referral for power wheelchair evaluation from Dr. Einar Pheasant. 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  Patient reports her current manual w/c is insufficient for her needs- She states she is unable to manuever independently. She reports multiple falls (3) in past 6 months.   ? Limitations Standing;Walking   ? How long can you sit comfortably? no issues   ? How long can you stand comfortably? 15 min.   ? How long can you walk comfortably? 10 min.   ? Patient Stated Goals Standing, walking, transfers   ? Currently in Pain? No/denies   ? Pain Onset More than a month ago   leg pain and swelling first noticed during long care trip ~ 7 hours  ? ?  ?  ? ?  ? ? ? ? ?TREATMENT: ? ?Seated 2# around BLE: ?-hip flexion march x10 reps each LE ?-LAQ x10 reps  each LE; ?Patient required min VCS for proper exercise technique including to increase AROM for better strengthening;  ? ?Patient expressed desire to review HEP- provided written HEP ?Provided new red band for resistance; ? ?Patient required min A for sit<>Stand transfer wheelchair to RW with cues for hand placement and positioning;  ? ?Patient instructed in gait with RW x10 feet with min A and cues for step length; does exhibit increased LLE foot drag and heavy forward flexed posture ? ?Gait in parallel bars:  ?Forward/backward walking x1 laps with heavy UE weight bearing, increased LLE foot drag, narrow base of support, flexed posture; required min A for safety; Patient also required cues to increase erect posture and increase hip activation for better foot clearance; ? ?Standing in parallel bars: ?Forward/backward step with single LE to challenge  stance control on opposite leg and increase hip ROM; Patient exhibits decreased ROM on LLE requiring cues for erect posture and to increase core stabilization for better foot clearance; She does exhibit increased LLE foot drag; ? ?Patient reports significant fatigue at end of session; she verbalized understanding of HEP;She did require min A for repositioning in wheelchair;  ? ? ? ? ? ? ? ? ? ? ? ? ? ? ? ? ? ? ? ? ? ? ? PT Education - 01/09/22 1213   ? ? Education provided Yes   ? Education Details exercise technique/HEP   ? Person(s) Educated Patient   ? Methods Explanation;Verbal cues   ? Comprehension Verbalized understanding;Returned demonstration;Verbal cues required;Need further instruction   ? ?  ?  ? ?  ? ? ? PT Short Term Goals - 12/17/21 1144   ? ?  ? PT SHORT TERM GOAL #1  ? Title Patient will be independent in home exercise program to improve strength/mobility for better functional independence with ADLs.   ? Baseline Pt does not have HEP; 09/25/2021- Patient reports compliant wth HEP consisting of LE strengthening.   ? Time 4   ? Period Weeks   ? Status Achieved   ? Target Date 09/05/21   ?  ? PT SHORT TERM GOAL #2  ? Title Patient will increase FOTO score to equal to or greater than 4    to demonstrate statistically significant improvement in mobility and quality of life.   ? Baseline 28; 09/25/2021- will reassess next visit; 10/31/2021= 24; 2/14: 39   ? Time 6   ? Period Weeks   ? Status Achieved   ? Target Date 12/12/21   ?  ? PT SHORT TERM GOAL #3  ? Title Pt and caregivers will understand PT recommendation and appropriate/safe use for wheelchair and seating for home use.   ? Baseline 11/12/2021- Patient is currently limited to manual w/c and difficulty with transfers and dependent on caregiver for navigation.; 2/14: Pt recently had WC eval. Pt verbalizes use of correct home equipment.   ? Time 1   ? Period Days   ? Status Achieved   ? Target Date 11/12/21   ? ?  ?  ? ?  ? ? ? ? PT Long Term Goals -  12/17/21 1144   ? ?  ? PT LONG TERM GOAL #1  ? Title Patient will be independent with advanced and progressive home program for strength and endurance in order to transition to self management   ? Baseline Pt does not have current formal HEP; 09/25/2021- Patient does report compliance with initial HEP consisting on seated therex for LE strengthening. 10/31/2021= Patient  is performing initial HEP with seated LE strengthening but will benefit from progressive strengthening including standing/walking/balance; 2/14: pt not consistently performing her current HEP; 3/7: pt not consistent but has increased frequency of performing interventions. PT provides education on importance of HEP   ? Time 12   ? Period Weeks   ? Status On-going   ? Target Date 03/11/22   ?  ? PT LONG TERM GOAL #2  ? Title pt's FOTO score will improve by 4 points or more indicating improved confidence with daily tasks at home   ? Baseline 28 at intake: 09/25/2021- Will assess next visit; 10/31/2021= 24; 2/14: 39; 3/7: 45   ? Time 12   ? Period Weeks   ? Status Achieved   ? Target Date 01/23/22   ?  ? PT LONG TERM GOAL #3  ? Title Patient will deny any falls over past 6 weeks to demonstrate improved safety awareness at home and work.   ? Baseline Pt reports 3 falls in previous 6 months; 09/25/2021= No falls reported and patient has been focusing on walking  some in clinic.   ? Time 6   ? Period Weeks   ? Status Achieved   ?  ? PT LONG TERM GOAL #4  ? Title Patient will perform sit to and from stand from her wheelchair with min assist and no upper extremity support in order to indicate improvement in function.   ? Baseline Patient requires mod assist from author and bilateral lower extremity support to transition to and from sitting. 09/25/2021- Patient continues to require repetitive VC, TC, and min physical assistance to stand as wel as B UE Support.  10/31/2021= Patient continues to present with LE weakness- requires BUE support on armrest as well as  min physical assist to stand.; 2/14: Pt requires BUE support of of chair armrests and CGA from PT to complete STS; 3/7: Pt achieves CGA from [facility] by pulling and pushing to stand   ? Time 12   ? Period We

## 2022-01-09 NOTE — Patient Instructions (Addendum)
Access Code: [email or web address] ?URL: [email or web address] ?Date: 01/09/2022/ Previously given on 12/04/21 ?Prepared by: Blanche East ? ?Exercises ?- Seated Hip Abduction with Resistance  - 1 x daily - 5 x weekly - 3 sets - 10 reps ?- Seated Hip Adduction Isometrics with Ball  - 1 x daily - 5 x weekly - 3 sets - 10 reps - 3 hold ?- Seated March  - 1 x daily - 5 x weekly - 3 sets - 10 reps ?- Sit to Stand with Counter Support  - 1 x daily - 5 x weekly - 3 sets - 5 reps ?

## 2022-01-09 NOTE — Telephone Encounter (Signed)
Faxed to [employer] ?

## 2022-01-13 ENCOUNTER — Ambulatory Visit: Payer: Medicare Other

## 2022-01-13 NOTE — Telephone Encounter (Signed)
Cynthina from [employer] called stating she did not received a signed PMD standard written order. She said it was dated but not signed  ?

## 2022-01-14 ENCOUNTER — Ambulatory Visit: Payer: Medicare Other

## 2022-01-14 DIAGNOSIS — K5909 Other constipation: Secondary | ICD-10-CM | POA: Diagnosis not present

## 2022-01-14 DIAGNOSIS — Z86010 Personal history of colonic polyps: Secondary | ICD-10-CM | POA: Diagnosis not present

## 2022-01-14 DIAGNOSIS — K219 Gastro-esophageal reflux disease without esophagitis: Secondary | ICD-10-CM | POA: Diagnosis not present

## 2022-01-14 DIAGNOSIS — Z7902 Long term (current) use of antithrombotics/antiplatelets: Secondary | ICD-10-CM | POA: Diagnosis not present

## 2022-01-14 DIAGNOSIS — K5730 Diverticulosis of large intestine without perforation or abscess without bleeding: Secondary | ICD-10-CM | POA: Diagnosis not present

## 2022-01-14 NOTE — Telephone Encounter (Signed)
Faxed to [employer] ?

## 2022-01-14 NOTE — Telephone Encounter (Signed)
Signed and placed in box.   

## 2022-01-14 NOTE — Telephone Encounter (Signed)
Unsigned form placed out for signature ?

## 2022-01-15 ENCOUNTER — Encounter: Payer: BC Managed Care – PPO | Admitting: Occupational Therapy

## 2022-01-15 DIAGNOSIS — Z9181 History of falling: Secondary | ICD-10-CM | POA: Diagnosis not present

## 2022-01-15 DIAGNOSIS — R279 Unspecified lack of coordination: Secondary | ICD-10-CM | POA: Diagnosis not present

## 2022-01-15 DIAGNOSIS — R262 Difficulty in walking, not elsewhere classified: Secondary | ICD-10-CM | POA: Diagnosis not present

## 2022-01-15 DIAGNOSIS — I69398 Other sequelae of cerebral infarction: Secondary | ICD-10-CM | POA: Diagnosis not present

## 2022-01-15 DIAGNOSIS — R2681 Unsteadiness on feet: Secondary | ICD-10-CM | POA: Diagnosis not present

## 2022-01-15 DIAGNOSIS — M6281 Muscle weakness (generalized): Secondary | ICD-10-CM | POA: Diagnosis not present

## 2022-01-15 DIAGNOSIS — G35 Multiple sclerosis: Secondary | ICD-10-CM | POA: Diagnosis not present

## 2022-01-16 ENCOUNTER — Encounter: Payer: Medicare Other | Attending: Physical Medicine and Rehabilitation | Admitting: Psychology

## 2022-01-16 ENCOUNTER — Ambulatory Visit: Payer: Medicare Other

## 2022-01-16 DIAGNOSIS — I890 Lymphedema, not elsewhere classified: Secondary | ICD-10-CM | POA: Diagnosis not present

## 2022-01-16 DIAGNOSIS — Z8673 Personal history of transient ischemic attack (TIA), and cerebral infarction without residual deficits: Secondary | ICD-10-CM | POA: Diagnosis not present

## 2022-01-16 DIAGNOSIS — G4733 Obstructive sleep apnea (adult) (pediatric): Secondary | ICD-10-CM | POA: Insufficient documentation

## 2022-01-16 DIAGNOSIS — G8929 Other chronic pain: Secondary | ICD-10-CM | POA: Insufficient documentation

## 2022-01-16 DIAGNOSIS — M545 Low back pain, unspecified: Secondary | ICD-10-CM | POA: Insufficient documentation

## 2022-01-16 DIAGNOSIS — F411 Generalized anxiety disorder: Secondary | ICD-10-CM | POA: Diagnosis not present

## 2022-01-16 DIAGNOSIS — F482 Pseudobulbar affect: Secondary | ICD-10-CM | POA: Insufficient documentation

## 2022-01-16 DIAGNOSIS — I10 Essential (primary) hypertension: Secondary | ICD-10-CM | POA: Diagnosis not present

## 2022-01-16 DIAGNOSIS — G4700 Insomnia, unspecified: Secondary | ICD-10-CM | POA: Diagnosis not present

## 2022-01-21 ENCOUNTER — Encounter (HOSPITAL_BASED_OUTPATIENT_CLINIC_OR_DEPARTMENT_OTHER): Payer: Medicare Other | Admitting: Physical Medicine and Rehabilitation

## 2022-01-21 ENCOUNTER — Ambulatory Visit: Payer: Medicare Other

## 2022-01-21 DIAGNOSIS — I890 Lymphedema, not elsewhere classified: Secondary | ICD-10-CM | POA: Diagnosis not present

## 2022-01-21 DIAGNOSIS — G4733 Obstructive sleep apnea (adult) (pediatric): Secondary | ICD-10-CM

## 2022-01-21 NOTE — Progress Notes (Signed)
? ?Subjective:  ? ? Patient ID: Carly [name], female    DOB: Feb 01, 1951, 71 y.o.   MRN: [id number] ? ?HPI  ?An audio/video tele-health visit is felt to be the most appropriate encounter for this patient at this time. This is a follow up tele-visit via phone. The patient is at home. MD is at office. Prior to scheduling this appointment, our staff discussed the limitations of evaluation and management by telemedicine and the availability of in-person appointments. The patient expressed understanding and agreed to proceed.  ? ? Carly [name] is a 71 year old woman who was admitted to CIR with white matter periventricular CVA, presents for follow-up regarding lymphedema, anxiety,chronic fatigue, insomnia, OSA, and weight gain.  ?  ?1) Anxiety:  ?-her psychiatrist previously transitioned her from Seroquel back to benzodiazepine due to side effect profile of Seroquel, but this worsened her anxiety so we restarted it ?-she has been having a lot of anxiety related to her and her husband's move from their current home into a retirement home and she asks whether she may take the seroquel additionally as needed- once in the morning, once later in the day, and two at night ?-she is interested in following with a behavioral therapist ?-she and husband Carly [name] were scared of side effects of Seroquel that were discussed- she has not noted any this far.  ?-she has been sleeping but her legs feel very stiff at night ?-she continues to take Cymbalta '60mg'$  ?-we tried weaning her off the mirtazepine but she experienced weakness and insomnia and wanted to restart- titrated back up to her former dose.  ?-she discussed with her husband, who is also present on this call today, and she would prefer to restart Seroquel and stop Lorazepam.  ?-she has not had any counseling and would like to restart this. She has an appointment with Carly [name] in August and would like to start counseling earlier than this if possible.  ?-she loved  speaking with Carly [name] inpatient and would like to follow with him ?-she had symptoms of dizziness yesterday at the hairdresser and was asked to contact me regarding if this could be from the seroquel. She had had no other similar episodes since starting it.  ?-she has been sleeping well with the Seroquel at night.  ?-she would like a list of foods that can help anxiety.  ?-she asks about the dietary advice I gave her last visit regarding anxiety. ?-she has been using essential oil.  ?-the prednisone has caused weight gain.  ? ?2) MS: ?-has been having a lot of stiffness recently ?-EMS called twice due to difficulty getting her up off the floor ?-is receiving home therapy ?-she would like to see a different neurologist and asked for my recommendation ?  ?3) Lymphedema  ?-has significant bilateral lower extremity  ?-has greatly improved with lymphedema therapy ?-she recently received a device that has helped so much that she was able to get off of Lasix.  ?-has ordered compression garments ?-does not eat processed foods or salt.  ?-she has ordered something from [facility] to give her lymphatic massage.  ?-improving but still very bothersome to her ?-discussed bloodwork today ? ?4) HTN: SBP has been well controlled, but still with high sytolics of 527P at times ? ?5) Decreased appetite:  ?-weaning off of [name] has caused decreased appetite and decreased energy.  ? ?6) She developed pain in right side, and not sure if this is due to topamax or the antibiotics she is  on to treat a respiratory infection.  ? ?7) Insomnia:  ?-sleeping poorly due to her CPAP mask ?-respiratory therapist will seeing her tomorrow.  ?-she asks about Inspire.  ? 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 She received a call to schedule an appointment with Dr.  Joanell [name] office and is thankful for the referral.  ?-she asks what else she can do to help with this ?-she has been through multiple rounds of antibiotics without benefit.  ?-also feeling diffuse joint aches.  ? ?9) GERD ?-has been experiencing after eating ? ?10) Sinus congestion ?-pleased that her CT results were normal.  ? ?11) OSA ?-she notes that she was found to have OSA on recent sleep study and she was tearful about this as she feels it is just another condition she has ?-she was surprised as she feels that she sleeps well.  ?-she has note been sleeping well while using the CPAP mask and was told that O2 is leaking ?-she is working with a respiratory therapist ?-she feels anxiety about whether she will get enough sleep tonight, she has only been sleeping about 4 hours since using the CPAP ?-she has decided she does not want to pursue Inspire yet until she has learned more about it.  ?-she had a very productive visit with Carly [name] and he advised her to try her best to tolerate the CPAP mask, which she is doing.  ? ?12) Chronic fatigue ?-she was diagnosed with COVID 9 days ago and her breathing has improved but she has been feeling very fatigued ?-she has been taking the N-acetyl-cysteine supplement ?-she asks about taking injetable B12 as this really helped her son ?-she has not been wearing her CPAP as she is waiting for a new mask ? ? ? ?Pain Inventory ?Average Pain 5 ?Pain Right Now 2 ?My pain is aching and no pain.  Sometimes spasm in legs at night because of MST ? ?In the last 24 hours, has pain interfered with the following? ?General activity 4 ?Relation with others 7 ?Enjoyment of life 5 ? ?What TIME of day is your pain at its worst? night ?Sleep (in general) Fair ? ?Pain is worse with: sitting and unsure ?Pain improves with: medication ?Relief from Meds: 5 ? ?Family History  ?Problem Relation Age of Onset  ? Arthritis Mother   ? Hypertension Mother   ? Macular degeneration Mother   ?  Hypertension Father   ? Hyperlipidemia Father   ? Heart disease Maternal Grandfather   ? Diabetes Maternal Grandfather   ? Kidney disease Paternal Grandmother   ? ?Social History  ? ?Socioeconomic History  ? Marital status: Married  ?  Spouse name: Carly [name]  ? Number of children: 3  ? Years of education: 23  ? Highest education level: Associate degree: academic program  ?Occupational History  ? Not on file  ?Tobacco Use  ? Smoking status: Never  ? Smokeless tobacco: Never  ?Vaping Use  ? Vaping Use: Never used  ?Substance and Sexual Activity  ? Alcohol use: No  ?  Alcohol/week: 0.0 standard drinks  ? Drug use: No  ? Sexual activity: Not Currently  ?Other Topics Concern  ? Not on file  ?Social History Narrative  ? Lives with husband   ? Right handed  ? Caffeine: 1 cup of coffee in the AM,  coke occa. Trying to come off caffeine   ? ?Social Determinants of Health  ? ?Financial Resource Strain: Low Risk   ? Difficulty of Paying Living Expenses: Not hard at all  ?Food Insecurity: No Food Insecurity  ? Worried About Charity fundraiser in the Last Year: Never true  ? Ran Out of Food in the Last Year: Never true  ?Transportation Needs: No Transportation Needs  ? Lack of Transportation (Medical): No  ? Lack of Transportation (Non-Medical): No  ?Physical Activity: Insufficiently Active  ? Days of Exercise per Week: 2 days  ? Minutes of Exercise per Session: 60 min  ?Stress: No Stress Concern Present  ? Feeling of Stress : Not at all  ?Social Connections: Unknown  ? Frequency of Communication with Friends and Family: Not on file  ? Frequency of Social Gatherings with Friends and Family: Not on file  ? Attends Religious Services: Not on file  ? Active Member of Clubs or Organizations: Not on file  ? Attends Archivist Meetings: Not on file  ? Marital Status: Married  ? ?Past Surgical History:  ?Procedure Laterality Date  ? BACK SURGERY    ? CHOLECYSTECTOMY    ? COLONOSCOPY WITH PROPOFOL N/A 05/18/2017  ? Procedure:  COLONOSCOPY WITH PROPOFOL;  Surgeon: [name], MD;  Location: [facility] ENDOSCOPY;  Service: Endoscopy;  Laterality: N/A;  ? FOOT SURGERY  2015  ? GALLBLADDER SURGERY  2008  ? HARDWARE REMOVAL Left 02/14/2016  ? Procedure:

## 2022-01-22 DIAGNOSIS — G35 Multiple sclerosis: Secondary | ICD-10-CM | POA: Diagnosis not present

## 2022-01-22 DIAGNOSIS — R2681 Unsteadiness on feet: Secondary | ICD-10-CM | POA: Diagnosis not present

## 2022-01-22 DIAGNOSIS — I69398 Other sequelae of cerebral infarction: Secondary | ICD-10-CM | POA: Diagnosis not present

## 2022-01-22 DIAGNOSIS — R279 Unspecified lack of coordination: Secondary | ICD-10-CM | POA: Diagnosis not present

## 2022-01-22 DIAGNOSIS — M6281 Muscle weakness (generalized): Secondary | ICD-10-CM | POA: Diagnosis not present

## 2022-01-22 DIAGNOSIS — R262 Difficulty in walking, not elsewhere classified: Secondary | ICD-10-CM | POA: Diagnosis not present

## 2022-01-23 ENCOUNTER — Ambulatory Visit: Payer: Medicare Other

## 2022-01-24 ENCOUNTER — Telehealth: Payer: Self-pay | Admitting: Internal Medicine

## 2022-01-24 DIAGNOSIS — I69398 Other sequelae of cerebral infarction: Secondary | ICD-10-CM | POA: Diagnosis not present

## 2022-01-24 DIAGNOSIS — R279 Unspecified lack of coordination: Secondary | ICD-10-CM | POA: Diagnosis not present

## 2022-01-24 DIAGNOSIS — R2681 Unsteadiness on feet: Secondary | ICD-10-CM | POA: Diagnosis not present

## 2022-01-24 DIAGNOSIS — R262 Difficulty in walking, not elsewhere classified: Secondary | ICD-10-CM | POA: Diagnosis not present

## 2022-01-24 DIAGNOSIS — M6281 Muscle weakness (generalized): Secondary | ICD-10-CM | POA: Diagnosis not present

## 2022-01-24 DIAGNOSIS — G35 Multiple sclerosis: Secondary | ICD-10-CM | POA: Diagnosis not present

## 2022-01-24 NOTE — Telephone Encounter (Signed)
Left message to call the office to schedule an appointment for medical clearance before 04/23/22. ?

## 2022-01-28 ENCOUNTER — Ambulatory Visit: Payer: Medicare Other

## 2022-01-28 DIAGNOSIS — M79601 Pain in right arm: Secondary | ICD-10-CM | POA: Diagnosis not present

## 2022-01-28 DIAGNOSIS — M5413 Radiculopathy, cervicothoracic region: Secondary | ICD-10-CM | POA: Diagnosis not present

## 2022-01-28 DIAGNOSIS — M9901 Segmental and somatic dysfunction of cervical region: Secondary | ICD-10-CM | POA: Diagnosis not present

## 2022-01-28 DIAGNOSIS — M542 Cervicalgia: Secondary | ICD-10-CM | POA: Diagnosis not present

## 2022-01-28 DIAGNOSIS — M9900 Segmental and somatic dysfunction of head region: Secondary | ICD-10-CM | POA: Diagnosis not present

## 2022-01-28 DIAGNOSIS — M7918 Myalgia, other site: Secondary | ICD-10-CM | POA: Diagnosis not present

## 2022-01-28 DIAGNOSIS — M79602 Pain in left arm: Secondary | ICD-10-CM | POA: Diagnosis not present

## 2022-01-28 DIAGNOSIS — R519 Headache, unspecified: Secondary | ICD-10-CM | POA: Diagnosis not present

## 2022-01-28 DIAGNOSIS — M5412 Radiculopathy, cervical region: Secondary | ICD-10-CM | POA: Diagnosis not present

## 2022-01-29 DIAGNOSIS — R2681 Unsteadiness on feet: Secondary | ICD-10-CM | POA: Diagnosis not present

## 2022-01-29 DIAGNOSIS — R262 Difficulty in walking, not elsewhere classified: Secondary | ICD-10-CM | POA: Diagnosis not present

## 2022-01-29 DIAGNOSIS — G35 Multiple sclerosis: Secondary | ICD-10-CM | POA: Diagnosis not present

## 2022-01-29 DIAGNOSIS — M6281 Muscle weakness (generalized): Secondary | ICD-10-CM | POA: Diagnosis not present

## 2022-01-29 DIAGNOSIS — I69398 Other sequelae of cerebral infarction: Secondary | ICD-10-CM | POA: Diagnosis not present

## 2022-01-29 DIAGNOSIS — R279 Unspecified lack of coordination: Secondary | ICD-10-CM | POA: Diagnosis not present

## 2022-01-30 ENCOUNTER — Ambulatory Visit: Payer: Medicare Other

## 2022-01-30 ENCOUNTER — Ambulatory Visit: Payer: Medicare Other | Admitting: Psychology

## 2022-01-31 ENCOUNTER — Other Ambulatory Visit: Payer: Self-pay | Admitting: Physical Medicine and Rehabilitation

## 2022-01-31 ENCOUNTER — Telehealth: Payer: Self-pay | Admitting: Physical Medicine and Rehabilitation

## 2022-01-31 DIAGNOSIS — R279 Unspecified lack of coordination: Secondary | ICD-10-CM | POA: Diagnosis not present

## 2022-01-31 DIAGNOSIS — I69398 Other sequelae of cerebral infarction: Secondary | ICD-10-CM | POA: Diagnosis not present

## 2022-01-31 DIAGNOSIS — I890 Lymphedema, not elsewhere classified: Secondary | ICD-10-CM

## 2022-01-31 DIAGNOSIS — R262 Difficulty in walking, not elsewhere classified: Secondary | ICD-10-CM | POA: Diagnosis not present

## 2022-01-31 DIAGNOSIS — G35 Multiple sclerosis: Secondary | ICD-10-CM | POA: Diagnosis not present

## 2022-01-31 DIAGNOSIS — R2681 Unsteadiness on feet: Secondary | ICD-10-CM | POA: Diagnosis not present

## 2022-01-31 DIAGNOSIS — M6281 Muscle weakness (generalized): Secondary | ICD-10-CM | POA: Diagnosis not present

## 2022-01-31 NOTE — Telephone Encounter (Signed)
I called patient and her caregiver said that patient needs an order sent to [facility] for her Compression Hose/She is a 30/40 compression and size 3. ?

## 2022-02-03 ENCOUNTER — Other Ambulatory Visit: Payer: Self-pay | Admitting: Neurology

## 2022-02-03 ENCOUNTER — Telehealth: Payer: Self-pay | Admitting: Neurology

## 2022-02-03 MED ORDER — DALFAMPRIDINE ER 10 MG PO TB12: 10.0000 mg | ORAL_TABLET | Freq: Two times a day (BID) | ORAL | 5 refills | Status: DC

## 2022-02-03 NOTE — Telephone Encounter (Signed)
Pt's husband is asking for a refill on pt's AMPYRA? (dalfampridine)  '10mg'$  to [facility] fax# [phone] ?

## 2022-02-03 NOTE — Telephone Encounter (Signed)
Refill has been completed and sent to the pharmacy requested ?

## 2022-02-04 ENCOUNTER — Encounter: Payer: Self-pay | Admitting: Physical Medicine and Rehabilitation

## 2022-02-04 ENCOUNTER — Ambulatory Visit: Payer: Medicare Other

## 2022-02-04 ENCOUNTER — Encounter (HOSPITAL_BASED_OUTPATIENT_CLINIC_OR_DEPARTMENT_OTHER): Payer: Medicare Other | Admitting: Physical Medicine and Rehabilitation

## 2022-02-04 ENCOUNTER — Encounter: Payer: Medicare Other | Admitting: Occupational Therapy

## 2022-02-04 VITALS — BP 138/79 | HR 98 | Ht 63.0 in

## 2022-02-04 DIAGNOSIS — F411 Generalized anxiety disorder: Secondary | ICD-10-CM

## 2022-02-04 DIAGNOSIS — I10 Essential (primary) hypertension: Secondary | ICD-10-CM

## 2022-02-04 DIAGNOSIS — M545 Low back pain, unspecified: Secondary | ICD-10-CM

## 2022-02-04 DIAGNOSIS — I890 Lymphedema, not elsewhere classified: Secondary | ICD-10-CM | POA: Diagnosis not present

## 2022-02-04 DIAGNOSIS — Z8673 Personal history of transient ischemic attack (TIA), and cerebral infarction without residual deficits: Secondary | ICD-10-CM | POA: Diagnosis not present

## 2022-02-04 DIAGNOSIS — F482 Pseudobulbar affect: Secondary | ICD-10-CM | POA: Diagnosis not present

## 2022-02-04 DIAGNOSIS — G4700 Insomnia, unspecified: Secondary | ICD-10-CM

## 2022-02-04 DIAGNOSIS — G8929 Other chronic pain: Secondary | ICD-10-CM

## 2022-02-04 NOTE — Progress Notes (Signed)
? ?Subjective:  ? ? Patient ID: Carly [name], female    DOB: 1950-12-13, 71 y.o.   MRN: [id number] ? ?HPI  ? ? Carly [name] is a 71 year old woman who was admitted for follow-up of CIR with white matter periventricular CVA, presents for follow-up regarding lymphedema, anxiety,chronic fatigue, insomnia, OSA, and weight gain.  ?  ?1) Anxiety:  ?-her psychiatrist previously transitioned her from Seroquel back to benzodiazepine due to side effect profile of Seroquel, but this worsened her anxiety so we restarted it ?-she has been having a lot of anxiety related to her and her husband's move from their current home into a retirement home and she asks whether she may take the seroquel additionally as needed- once in the morning, once later in the day, and two at night ?-she is interested in following with a behavioral therapist ?-she and husband [name] were scared of side effects of Seroquel that were discussed- she has not noted any this far.  ?-she has been sleeping but her legs feel very stiff at night ?-she continues to take Cymbalta '60mg'$  ?-we tried weaning her off the mirtazepine but she experienced weakness and insomnia and wanted to restart- titrated back up to her former dose.  ?-she discussed with her husband, who is also present on this call today, and she would prefer to restart Seroquel and stop Lorazepam.  ?-she has not had any counseling and would like to restart this. She has an appointment with [name] in August and would like to start counseling earlier than this if possible.  ?-she loved speaking with [name] inpatient and would like to follow with him ?-she had symptoms of dizziness yesterday at the hairdresser and was asked to contact me regarding if this could be from the seroquel. She had had no other similar episodes since starting it.  ?-she has been sleeping well with the Seroquel at night.  ?-she would like a list of foods that can help anxiety.  ?-she asks about the dietary  advice I gave her last visit regarding anxiety. ?-she has been using essential oil.  ?-anxiety has been bad ?-off prednisone now ?-exercise helps her.  ?-she gets upset over things easily ?-she wants to be able to calm down.  ? ?2) MS: ?-has been having a lot of stiffness recently ?-EMS called twice due to difficulty getting her up off the floor ?-is receiving home therapy ?-she would like to see a different neurologist and asked for my recommendation ?  ?3) Lymphedema  ?-has significant bilateral lower extremity  ?-has greatly improved with lymphedema therapy ?-she recently received a device that has helped so much that she was able to get off of Lasix.  ?-has ordered compression garments ?-does not eat processed foods or salt.  ?-she has ordered something from [facility] to give her lymphatic massage.  ?-improving but still very bothersome to her ?-discussed bloodwork today ? ?4) HTN: ?-BP 138/79 ? ?5) Decreased appetite:  ?-weaning off of [name] has caused decreased appetite and decreased energy.  ? ?6) She developed pain in right side, and not sure if this is due to topamax or the antibiotics she is on to treat a respiratory infection.  ? ?7) Insomnia:  ?-sleeping poorly due to her CPAP mask, pressure is rising at night and this is causing mask leak.  ?-respiratory therapist will seeing her tomorrow.  ?-she asks about Inspire.  ?-she follows with [name] and she says he is also a sleep doctor ?-she  hates to reduce the medicine.  ?-she has tried 5 different masks ?-the machine is very loud.  ?-she can sleep without the CPAP fine.  ? ?8) Chest congestion ?-she has been having symptoms of congestion for greater than 3 months ?-Her father saw 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 She received a call to schedule an appointment with [name] office and is thankful for the referral.  ?-she asks what else she can do to help with this ?-she  has been through multiple rounds of antibiotics without benefit.  ?-also feeling diffuse joint aches.  ? ?9) GERD ?-has been experiencing after eating ? ?10) Sinus congestion ?-pleased that her CT results were normal.  ? ?11) OSA ?-she notes that she was found to have OSA on recent sleep study and she was tearful about this as she feels it is just another condition she has ?-she was surprised as she feels that she sleeps well.  ?-she has note been sleeping well while using the CPAP mask and was told that O2 is leaking ?-she is working with a respiratory therapist ?-she feels anxiety about whether she will get enough sleep tonight, she has only been sleeping about 4 hours since using the CPAP ?-she has decided she does not want to pursue Inspire yet until she has learned more about it.  ?-she had a very productive visit with [name] and he advised her to try her best to tolerate the CPAP mask, which she is doing.  ? ?12) Chronic fatigue ?-she was diagnosed with COVID 9 days ago and her breathing has improved but she has been feeling very fatigued ?-she has been taking the N-acetyl-cysteine supplement ?-she asks about taking injetable B12 as this really helped her son ?-she has not been wearing her CPAP as she is waiting for a new mask ? ?13) Pain ?-takes Tylenol and baclofen ? ? ? ?Pain Inventory ?Average Pain 5 ?Pain Right Now 2 ?My pain is aching and no pain.  Sometimes spasm in legs at night because of MST ? ?In the last 24 hours, has pain interfered with the following? ?General activity 4 ?Relation with others 7 ?Enjoyment of life 5 ? ?What TIME of day is your pain at its worst? night ?Sleep (in general) Fair ? ?Pain is worse with: sitting and unsure ?Pain improves with: medication ?Relief from Meds: 5 ? ?Family History  ?Problem Relation Age of Onset  ? Arthritis Mother   ? Hypertension Mother   ? Macular degeneration Mother   ? Hypertension Father   ? Hyperlipidemia Father   ? Heart disease Maternal  Grandfather   ? Diabetes Maternal Grandfather   ? Kidney disease Paternal Grandmother   ? ?Social History  ? ?Socioeconomic History  ? Marital status: Married  ?  Spouse name: [name]  ? Number of children: 3  ? Years of education: 88  ? Highest education level: Associate degree: academic program  ?Occupational History  ? Not on file  ?Tobacco Use  ? Smoking status: Never  ? Smokeless tobacco: Never  ?Vaping Use  ? Vaping Use: Never used  ?Substance and Sexual Activity  ? Alcohol use: No  ?  Alcohol/week: 0.0 standard drinks  ? Drug use: No  ? Sexual activity: Not Currently  ?Other Topics Concern  ? Not on file  ?Social History Narrative  ? Lives with husband   ? Right handed  ? Caffeine: 1 cup of coffee  in the AM, coke occa. Trying to come off caffeine   ? ?Social Determinants of Health  ? ?Financial Resource Strain: Low Risk   ? Difficulty of Paying Living Expenses: Not hard at all  ?Food Insecurity: No Food Insecurity  ? Worried About Charity fundraiser in the Last Year: Never true  ? Ran Out of Food in the Last Year: Never true  ?Transportation Needs: No Transportation Needs  ? Lack of Transportation (Medical): No  ? Lack of Transportation (Non-Medical): No  ?Physical Activity: Insufficiently Active  ? Days of Exercise per Week: 2 days  ? Minutes of Exercise per Session: 60 min  ?Stress: No Stress Concern Present  ? Feeling of Stress : Not at all  ?Social Connections: Unknown  ? Frequency of Communication with Friends and Family: Not on file  ? Frequency of Social Gatherings with Friends and Family: Not on file  ? Attends Religious Services: Not on file  ? Active Member of Clubs or Organizations: Not on file  ? Attends Archivist Meetings: Not on file  ? Marital Status: Married  ? ?Past Surgical History:  ?Procedure Laterality Date  ? BACK SURGERY    ? CHOLECYSTECTOMY    ? COLONOSCOPY WITH PROPOFOL N/A 05/18/2017  ? Procedure: COLONOSCOPY WITH PROPOFOL;  Surgeon: [name], MD;  Location: [facility]  ENDOSCOPY;  Service: Endoscopy;  Laterality: N/A;  ? FOOT SURGERY  2015  ? GALLBLADDER SURGERY  2008  ? HARDWARE REMOVAL Left 02/14/2016  ? Procedure: LEFT FOOT REMOVAL DEEP IMPLANT;  Surgeon: [name], MD;

## 2022-02-04 NOTE — Patient Instructions (Signed)
Insomnia: ?-Try to go outside near sunrise ?-Get exercise during the day.  ?-Turn off all devices an hour before bedtime.  ?-Teas that can benefit: chamomile, valerian root, Brahmi (Bacopa) ?-Can consider over the counter melatonin, magnesium, and/or L-theanine. Melatonin is an anti-oxidant with multiple health benefits. Magnesium is involved in greater than 300 enzymatic reactions in the body and most of [place] are deficient as our soil is often depleted. There are 7 different types of magnesium- Bioptemizer's is a supplement with all 7 types, and each has unique benefits. Magnesium can also help with constipation and anxiety.  ?-Pistachios naturally increase the production of melatonin ?-Cozy Earth bamboo bed sheets are free from toxic chemicals.  ?-Tart cherry juice or a tart cherry supplement can improve sleep and soreness post-workout ?  ?.-recommended doTerra Deep Blue Essential oil and applied to area of pain today- discussed that this is made of natural plant oils. Shared by personal experience of benefit from use of this essential oil ?-provided a link to a pdf of Pete Escogue's musculoskeletal alignment exercises: https://www.berger.biz/.pdf  ?

## 2022-02-05 ENCOUNTER — Telehealth: Payer: Self-pay | Admitting: Neurology

## 2022-02-05 DIAGNOSIS — I69398 Other sequelae of cerebral infarction: Secondary | ICD-10-CM | POA: Diagnosis not present

## 2022-02-05 DIAGNOSIS — M6281 Muscle weakness (generalized): Secondary | ICD-10-CM | POA: Diagnosis not present

## 2022-02-05 DIAGNOSIS — R279 Unspecified lack of coordination: Secondary | ICD-10-CM | POA: Diagnosis not present

## 2022-02-05 DIAGNOSIS — R2681 Unsteadiness on feet: Secondary | ICD-10-CM | POA: Diagnosis not present

## 2022-02-05 DIAGNOSIS — R262 Difficulty in walking, not elsewhere classified: Secondary | ICD-10-CM | POA: Diagnosis not present

## 2022-02-05 DIAGNOSIS — G35 Multiple sclerosis: Secondary | ICD-10-CM | POA: Diagnosis not present

## 2022-02-06 ENCOUNTER — Encounter: Payer: Self-pay | Admitting: Psychology

## 2022-02-06 ENCOUNTER — Ambulatory Visit: Payer: Medicare Other

## 2022-02-06 NOTE — Progress Notes (Addendum)
Neuropsychology Visit  Patient:  MICHAILA KENNEY   DOB: Nov 13, 1950  MR Number: [id number]  Location: [facility] [place], [place] [id number] [place] [address] Dept: [phone]  Date of Service: 01/16/2022  Start: 1 PM End: 2 PM  Today's visit was a 1 hour visit that was conducted in my outpatient clinic office.  It was in person visit.  The patient myself were present.  This was a follow-up from initial evaluation that was conducted on 05/14/2021.  Duration of Service: 1 Hour  Provider/Observer:     Edgardo Roys PsyD  Chief Complaint:      Chief Complaint  Patient presents with   Cerebrovascular Accident   Depression    Reason For Service:      ATALIE OROS is a 71 year old female referred for follow-up visit after her inpatient hospitalization in March of this year.  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  The patient is continued to have right arm motor deficits and difficulty with use.  She is also having trouble eating and swallowing and trouble getting in and out of the shower with increased fear of falling.  The patient's husband reports that the patient has fallen a number of times and while not falling in the shower she has a great deal of phobia and fear around falling in the shower.  They describe an overreaction to getting into the shower and she still has trouble with her right dominant hand especially when in use.  The patient's past medical history includes history of MS, GERD, anxiety/depression, headache.  The patient had presented to the emergency department on 12/08/2020 after a 1 day history of weakness with difficulty standing, dizziness, mental status changes and difficulty picking up items with her right hand.   MRI of her brain was done revealing acute white matter infarcts and deep white matter left corona radiata and extensive abnormal T2/FLAIR hyperintense signal.  MRI brain was negative for stenosis or LVO but showed diffuse atherosclerotic irregularity most severe in PCA branches.  Neurology felt that the stroke was due to small vessel disease.  During her hospitalization psychiatry was initially consulted with assistance due to management of chronic generalized anxiety disorder with bouts of lability.  The patient has chronic left-sided weakness likely due to MS and now right-sided weakness affecting her balance and motor functioning.  Patient had a right lateral lean with left knee buckling along with initial reduced information processing speed and difficulties with memory recall versus an inability to learn and store new information.  I did see the patient during her inpatient hospitalization on the comprehensive rehabilitation unit for 1 visit.  This appointment is a follow-up from her rehab stay and a result of her continuation of difficulties.  The patient acknowledges an exacerbation of her longstanding anxiety and depressive symptomatology.  She reports that fear of falling and other difficulties with present.  Treatment Interventions:  Therapeutic interventions around issues associated with exacerbation of longstanding anxiety and depression and the development of pseudobulbar affect type symptoms.  Participation Level:   Active  Participation Quality:  Appropriate      Behavioral Observation:  Well Groomed, Alert, and Appropriate.   Current Psychosocial Factors: The patient reports that she is struggled with some of her mood changes  including anxiety and depressive symptomatology.  While these were present she has continued to struggle with residual effects of her stroke including left lower extremity weakness and gait disorder.  The patient is continue to have trouble eating and swallowing  appropriately and getting in and out of the shower.  Content of Session:   Reviewed current symptoms and continue to work on therapeutic interventions around the emotional impact her residual cognitive deficits and motor deficits have had since her stroke.  Effectiveness of Interventions: Patient was open and actively engaged in therapeutic process.  Target Goals:   Will better coping skills around adjustment to residual effects of her CVA.  Goals Last Reviewed:   4/6/20232  Goals Addressed Today:    Today we worked on cognitive behavioral therapeutic interventions around better management of her residual effects of her significant medical issues.  Impression/Diagnosis:   RAY GLACKEN is a 71 year old female referred for follow-up visit after her inpatient hospitalization in March of this year.  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  The patient is continued to have right arm motor deficits and difficulty with use.  She is also having trouble eating and swallowing and trouble getting in and out of the shower with increased fear of falling.  The patient's husband reports that the patient has fallen a number of times and while not falling in the shower she has a great deal of phobia and fear around falling in the shower.  They describe an overreaction to getting into the shower and she still has trouble with her right dominant hand especially when in use.  The patient's past medical history includes history of MS, GERD, anxiety/depression, headache.  Diagnosis:   History of CVA (cerebrovascular accident)  PBA (pseudobulbar affect)  Anxiety state    Ilean Skill, Psy.D. Clinical Psychologist Neuropsychologist

## 2022-02-10 DIAGNOSIS — M6281 Muscle weakness (generalized): Secondary | ICD-10-CM | POA: Diagnosis not present

## 2022-02-10 DIAGNOSIS — I69398 Other sequelae of cerebral infarction: Secondary | ICD-10-CM | POA: Diagnosis not present

## 2022-02-10 DIAGNOSIS — R279 Unspecified lack of coordination: Secondary | ICD-10-CM | POA: Diagnosis not present

## 2022-02-10 DIAGNOSIS — Z9181 History of falling: Secondary | ICD-10-CM | POA: Diagnosis not present

## 2022-02-10 DIAGNOSIS — R2681 Unsteadiness on feet: Secondary | ICD-10-CM | POA: Diagnosis not present

## 2022-02-10 DIAGNOSIS — G35 Multiple sclerosis: Secondary | ICD-10-CM | POA: Diagnosis not present

## 2022-02-10 DIAGNOSIS — R262 Difficulty in walking, not elsewhere classified: Secondary | ICD-10-CM | POA: Diagnosis not present

## 2022-02-11 ENCOUNTER — Other Ambulatory Visit: Payer: Self-pay | Admitting: Internal Medicine

## 2022-02-11 ENCOUNTER — Ambulatory Visit: Payer: Medicare Other

## 2022-02-11 DIAGNOSIS — G4733 Obstructive sleep apnea (adult) (pediatric): Secondary | ICD-10-CM | POA: Insufficient documentation

## 2022-02-12 ENCOUNTER — Encounter
Payer: BC Managed Care – PPO | Attending: Physical Medicine and Rehabilitation | Admitting: Physical Medicine and Rehabilitation

## 2022-02-12 ENCOUNTER — Telehealth: Payer: Self-pay | Admitting: *Deleted

## 2022-02-12 DIAGNOSIS — F411 Generalized anxiety disorder: Secondary | ICD-10-CM | POA: Insufficient documentation

## 2022-02-12 DIAGNOSIS — G4733 Obstructive sleep apnea (adult) (pediatric): Secondary | ICD-10-CM | POA: Insufficient documentation

## 2022-02-12 DIAGNOSIS — J069 Acute upper respiratory infection, unspecified: Secondary | ICD-10-CM | POA: Diagnosis not present

## 2022-02-12 DIAGNOSIS — G4701 Insomnia due to medical condition: Secondary | ICD-10-CM | POA: Diagnosis not present

## 2022-02-12 DIAGNOSIS — H6123 Impacted cerumen, bilateral: Secondary | ICD-10-CM | POA: Diagnosis not present

## 2022-02-12 DIAGNOSIS — J301 Allergic rhinitis due to pollen: Secondary | ICD-10-CM | POA: Diagnosis not present

## 2022-02-12 MED ORDER — MIRTAZAPINE 30 MG PO TBDP: 30.0000 mg | ORAL_TABLET | Freq: Every day | ORAL | 3 refills | Status: DC

## 2022-02-12 MED ORDER — DULOXETINE HCL 60 MG PO CPEP: 60.0000 mg | ORAL_CAPSULE | Freq: Every day | ORAL | 3 refills | Status: DC

## 2022-02-12 NOTE — Progress Notes (Signed)
? ?Subjective:  ? ? Patient ID: Carly [name], female    DOB: 06/11/1951, 71 y.o.   MRN: [id number] ? ?HPI  ? ? Carly [name] is a 71 year old woman who presents for follow-up of white matter periventricular CVA, presents for follow-up regarding lymphedema, anxiety,chronic fatigue, insomnia, OSA, and weight gain.  ?  ?1) Anxiety:  ?-her psychiatrist previously transitioned her from Seroquel back to benzodiazepine due to side effect profile of Seroquel, but this worsened her anxiety so we restarted it ?-she has been having a lot of anxiety related to her and her husband's move from their current home into a retirement home and she asks whether she may take the seroquel additionally as needed- once in the morning, once later in the day, and two at night ?-she is interested in following with a behavioral therapist ?-she and husband Carly [name] were scared of side effects of Seroquel that were discussed- she has not noted any this far.  ?-she has been sleeping but her legs feel very stiff at night ?-she continues to take Cymbalta '60mg'$  ?-we tried weaning her off the mirtazepine but she experienced weakness and insomnia and wanted to restart- titrated back up to her former dose.  ?-she discussed with her husband, who is also present on this call today, and she would prefer to restart Seroquel and stop Lorazepam.  ?-she has not had any counseling and would like to restart this. She has an appointment with [name] in August and would like to start counseling earlier than this if possible.  ?-she loved speaking with [name] inpatient and would like to follow with him ?-she had symptoms of dizziness yesterday at the hairdresser and was asked to contact me regarding if this could be from the seroquel. She had had no other similar episodes since starting it.  ?-she has been sleeping well with the Seroquel at night.  ?-she would like a list of foods that can help anxiety.  ?-she asks about the dietary advice I gave  her last visit regarding anxiety. ?-she has been using essential oil.  ?-anxiety has been bad ?-off prednisone now ?-exercise helps her.  ?-she gets upset over things easily ?-she wants to be able to calm down.  ?-she needs refills of her cymbalta and [name] ? ?2) MS: ?-has been having a lot of stiffness recently ?-EMS called twice due to difficulty getting her up off the floor ?-is receiving home therapy ?-she would like to see a different neurologist and asked for my recommendation ?  ?3) Lymphedema  ?-has significant bilateral lower extremity  ?-has greatly improved with lymphedema therapy ?-she recently received a device that has helped so much that she was able to get off of Lasix.  ?-has ordered compression garments ?-does not eat processed foods or salt.  ?-she has ordered something from [facility] to give her lymphatic massage.  ?-improving but still very bothersome to her ?-discussed bloodwork today ? ?4) HTN: ?-BP 138/79 ? ?5) Decreased appetite:  ?-resolved with increasing [name] ? ?6) She developed pain in right side, and not sure if this is due to topamax or the antibiotics she is on to treat a respiratory infection.  ? ?7) Insomnia:  ?-sleeping poorly due to her CPAP mask, pressure is rising at night and this is causing mask leak.  ?-respiratory therapist will seeing her tomorrow.  ?-she asks about Inspire.  ?-she follows with [name] and she says he is also a sleep doctor ?-she hates to reduce  the medicine.  ?-she has tried 5 different masks ?-the machine is very loud.  ?-she can sleep without the CPAP fine.  ? ?8) Chest congestion ?-she has been having symptoms of congestion for greater than 3 months ?-Her father saw 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 She received a call to schedule an appointment with [name] office and is thankful for the referral.  ?-she asks what else she can do to help with this ?-she has  been through multiple rounds of antibiotics without benefit.  ?-also feeling diffuse joint aches.  ? ?9) GERD ?-has been experiencing after eating ? ?10) Sinus congestion ?-pleased that her CT results were normal.  ? ?11) OSA ?-she notes that she was found to have OSA on recent sleep study and she was tearful about this as she feels it is just another condition she has ?-she was surprised as she feels that she sleeps well.  ?-she has note been sleeping well while using the CPAP mask and was told that O2 is leaking ?-she is working with a respiratory therapist ?-she feels anxiety about whether she will get enough sleep tonight, she has only been sleeping about 4 hours since using the CPAP ?-she has decided she does not want to pursue Inspire yet until she has learned more about it.  ?-she had a very productive visit with [name] and he advised her to try her best to tolerate the CPAP mask, which she is doing.  ?-followed up with pulmonology regarding [name] and was told she would be a good candidate for this treatment, but she should prefer to continue trying CPAP mask before trying Inspire ? ?12) Chronic fatigue ?-she was diagnosed with COVID 9 days ago and her breathing has improved but she has been feeling very fatigued ?-she has been taking the N-acetyl-cysteine supplement ?-she asks about taking injetable B12 as this really helped her son ?-she has not been wearing her CPAP as she is waiting for a new mask ? ?13) Pain ?-takes Tylenol and baclofen ? ? ? ?Pain Inventory ?Average Pain 5 ?Pain Right Now 2 ?My pain is aching and no pain.  Sometimes spasm in legs at night because of MST ? ?In the last 24 hours, has pain interfered with the following? ?General activity 4 ?Relation with others 7 ?Enjoyment of life 5 ? ?What TIME of day is your pain at its worst? night ?Sleep (in general) Fair ? ?Pain is worse with: sitting and unsure ?Pain improves with: medication ?Relief from Meds: 5 ? ?Family History   ?Problem Relation Age of Onset  ? Arthritis Mother   ? Hypertension Mother   ? Macular degeneration Mother   ? Hypertension Father   ? Hyperlipidemia Father   ? Heart disease Maternal Grandfather   ? Diabetes Maternal Grandfather   ? Kidney disease Paternal Grandmother   ? ?Social History  ? ?Socioeconomic History  ? Marital status: Married  ?  Spouse name: Carly [name]  ? Number of children: 3  ? Years of education: 36  ? Highest education level: Associate degree: academic program  ?Occupational History  ? Not on file  ?Tobacco Use  ? Smoking status: Never  ? Smokeless tobacco: Never  ?Vaping Use  ? Vaping Use: Never used  ?Substance and Sexual Activity  ? Alcohol use: No  ?  Alcohol/week: 0.0 standard drinks  ? Drug use: No  ? Sexual activity: Not Currently  ?Other Topics  Concern  ? Not on file  ?Social History Narrative  ? Lives with husband   ? Right handed  ? Caffeine: 1 cup of coffee in the AM, coke occa. Trying to come off caffeine   ? ?Social Determinants of Health  ? ?Financial Resource Strain: Low Risk   ? Difficulty of Paying Living Expenses: Not hard at all  ?Food Insecurity: No Food Insecurity  ? Worried About Charity fundraiser in the Last Year: Never true  ? Ran Out of Food in the Last Year: Never true  ?Transportation Needs: No Transportation Needs  ? Lack of Transportation (Medical): No  ? Lack of Transportation (Non-Medical): No  ?Physical Activity: Insufficiently Active  ? Days of Exercise per Week: 2 days  ? Minutes of Exercise per Session: 60 min  ?Stress: No Stress Concern Present  ? Feeling of Stress : Not at all  ?Social Connections: Unknown  ? Frequency of Communication with Friends and Family: Not on file  ? Frequency of Social Gatherings with Friends and Family: Not on file  ? Attends Religious Services: Not on file  ? Active Member of Clubs or Organizations: Not on file  ? Attends Archivist Meetings: Not on file  ? Marital Status: Married  ? ?Past Surgical History:  ?Procedure  Laterality Date  ? BACK SURGERY    ? CHOLECYSTECTOMY    ? COLONOSCOPY WITH PROPOFOL N/A 05/18/2017  ? Procedure: COLONOSCOPY WITH PROPOFOL;  Surgeon: [name], MD;  Location: [facility] ENDOSCOPY;  Service: Irven Shelling

## 2022-02-12 NOTE — Telephone Encounter (Signed)
Carly [name] called and is asking for a call from Dr Ranell Patrick to discuss Cymbalta refill. She reports Dr Ranell Patrick does not want to refill it. It looks like she has a call into her PCP ( who filled it last) to fill it but it has not had a response. [phone] ?

## 2022-02-13 ENCOUNTER — Ambulatory Visit: Payer: Medicare Other

## 2022-02-13 DIAGNOSIS — M6281 Muscle weakness (generalized): Secondary | ICD-10-CM | POA: Diagnosis not present

## 2022-02-13 DIAGNOSIS — R279 Unspecified lack of coordination: Secondary | ICD-10-CM | POA: Diagnosis not present

## 2022-02-13 DIAGNOSIS — R262 Difficulty in walking, not elsewhere classified: Secondary | ICD-10-CM | POA: Diagnosis not present

## 2022-02-13 DIAGNOSIS — G35 Multiple sclerosis: Secondary | ICD-10-CM | POA: Diagnosis not present

## 2022-02-13 DIAGNOSIS — R2681 Unsteadiness on feet: Secondary | ICD-10-CM | POA: Diagnosis not present

## 2022-02-13 DIAGNOSIS — I69398 Other sequelae of cerebral infarction: Secondary | ICD-10-CM | POA: Diagnosis not present

## 2022-02-17 DIAGNOSIS — M6281 Muscle weakness (generalized): Secondary | ICD-10-CM | POA: Diagnosis not present

## 2022-02-17 DIAGNOSIS — I69398 Other sequelae of cerebral infarction: Secondary | ICD-10-CM | POA: Diagnosis not present

## 2022-02-17 DIAGNOSIS — R2681 Unsteadiness on feet: Secondary | ICD-10-CM | POA: Diagnosis not present

## 2022-02-17 DIAGNOSIS — R262 Difficulty in walking, not elsewhere classified: Secondary | ICD-10-CM | POA: Diagnosis not present

## 2022-02-17 DIAGNOSIS — G35 Multiple sclerosis: Secondary | ICD-10-CM | POA: Diagnosis not present

## 2022-02-17 DIAGNOSIS — R279 Unspecified lack of coordination: Secondary | ICD-10-CM | POA: Diagnosis not present

## 2022-02-18 ENCOUNTER — Telehealth: Payer: Medicare Other | Admitting: Physical Medicine and Rehabilitation

## 2022-02-18 ENCOUNTER — Ambulatory Visit: Payer: Medicare Other

## 2022-02-19 ENCOUNTER — Encounter: Payer: Self-pay | Admitting: Hematology and Oncology

## 2022-02-19 DIAGNOSIS — M6281 Muscle weakness (generalized): Secondary | ICD-10-CM | POA: Diagnosis not present

## 2022-02-19 DIAGNOSIS — R262 Difficulty in walking, not elsewhere classified: Secondary | ICD-10-CM | POA: Diagnosis not present

## 2022-02-19 DIAGNOSIS — I69398 Other sequelae of cerebral infarction: Secondary | ICD-10-CM | POA: Diagnosis not present

## 2022-02-19 DIAGNOSIS — G35 Multiple sclerosis: Secondary | ICD-10-CM | POA: Diagnosis not present

## 2022-02-19 DIAGNOSIS — R2681 Unsteadiness on feet: Secondary | ICD-10-CM | POA: Diagnosis not present

## 2022-02-19 DIAGNOSIS — R279 Unspecified lack of coordination: Secondary | ICD-10-CM | POA: Diagnosis not present

## 2022-02-20 ENCOUNTER — Ambulatory Visit: Payer: Medicare Other

## 2022-02-21 ENCOUNTER — Telehealth (INDEPENDENT_AMBULATORY_CARE_PROVIDER_SITE_OTHER): Payer: Medicare Other | Admitting: Internal Medicine

## 2022-02-21 ENCOUNTER — Other Ambulatory Visit: Payer: Self-pay | Admitting: *Deleted

## 2022-02-21 DIAGNOSIS — R350 Frequency of micturition: Secondary | ICD-10-CM

## 2022-02-21 DIAGNOSIS — G4733 Obstructive sleep apnea (adult) (pediatric): Secondary | ICD-10-CM

## 2022-02-21 NOTE — Progress Notes (Signed)
Patient ID: Carly [name], female   DOB: 1951/08/31, 71 y.o.   MRN: [id number]   Virtual Visit via video Note  All issues noted in this document were discussed and addressed.  No physical exam was performed (except for noted visual exam findings with Video Visits).   I connected with Carly [name] by a video enabled telemedicine application and verified that I am speaking with the correct person using two identifiers. Location patient: home Location provider: work  Persons participating in the virtual visit: patient, provider  The limitations, risks, security and privacy concerns of performing an evaluation and management service by video and the availability of in person appointments have been discussed.  It has also been discussed with the patient that there may be a patient responsible charge related to this service. The patient expressed understanding and agreed to proceed.   Reason for visit: work in appt.   HPI: Work in to discuss sleep apnea and lymphedema pump.  Saw pulmonary 02/11/22 - f/u sleep apnea.  Had reported intolerance to cpap.  They discussed other options, including oral devices.  She reports that last night, she slept better.  Discussed getting used to the mask.  Some increased nocturia.  No dysuria.  Discussed increased nocturia if untreated sleep apnea.  Also having issues with her leg pump.  Aggravating.  Did not use last night and slept better with her cpap.  Breathing stable.  No increased cough or congestion.  Eating.  Bowels moving.    ROS: See pertinent positives and negatives per HPI.  Past Medical History:  Diagnosis Date   Allergy    Anxiety    Aspiration pneumonia (HCC)    Depression    Frequent headaches    H/O   GERD (gastroesophageal reflux disease)    History of chicken pox    History of colon polyps    Hx of migraines    Multiple sclerosis ([place]) 2011   OSA (obstructive sleep apnea)    PONV (postoperative nausea and vomiting)     Past  Surgical History:  Procedure Laterality Date   BACK SURGERY     CHOLECYSTECTOMY     COLONOSCOPY WITH PROPOFOL N/A 05/18/2017   Procedure: COLONOSCOPY WITH PROPOFOL;  Surgeon: [name], MD;  Location[facility] ENDOSCOPY;  Service: Endoscopy;  Laterality: N/A;   FOOT SURGERY  2015   GALLBLADDER SURGERY  2008   HARDWARE REMOVAL Left 02/14/2016   Procedure: LEFT FOOT REMOVAL DEEP IMPLANT;  Surgeon: [name], MD;  Location: [place];  Service: Orthopedics;  Laterality: Left;   HERNIA REPAIR     Inguinal Hernia Repair   SPINE SURGERY  2014    Family History  Problem Relation Age of Onset   Arthritis Mother    Hypertension Mother    Macular degeneration Mother    Hypertension Father    Hyperlipidemia Father    Heart disease Maternal Grandfather    Diabetes Maternal Grandfather    Kidney disease Paternal Grandmother     SOCIAL HX: reviewed.   No current facility-administered medications for this visit. No current outpatient medications on file.  Facility-Administered Medications Ordered in Other Visits:    0.9 %  sodium chloride infusion, , Intravenous, Continuous, Corcoran, Melissa C, MD, Last Rate: 10 mL/hr at 04/25/16 0855, New Bag at 04/25/16 0855   acetaminophen (TYLENOL) tablet 650 mg, 650 mg, Oral, Q6H PRN, 650 mg at 03/02/22 0341 **OR** acetaminophen (TYLENOL) suppository 650 mg, 650 mg, Rectal, Q6H PRN, Mansy,  Arvella Merles, MD   acetaminophen (TYLENOL) tablet 650 mg, 650 mg, Oral, Once, Corcoran, Melissa C, MD   albuterol (PROVENTIL) (2.5 MG/3ML) 0.083% nebulizer solution 2.5 mg, 2.5 mg, Nebulization, Q6H PRN, Renda Rolls, RPH   amLODipine (NORVASC) tablet 5 mg, 5 mg, Oral, Daily, Mansy, Jan A, MD, 5 mg at 03/01/22 8110   azelastine (ASTELIN) 0.1 % nasal spray 1 spray, 1 spray, Each Nare, BID, Mansy, Jan A, MD, 1 spray at 03/01/22 3159   baclofen (LIORESAL) tablet 5 mg, 5 mg, Oral, TID, Mansy, Jan A, MD, 5 mg at 03/01/22 2141   busPIRone (BUSPAR) tablet 15  mg, 15 mg, Oral, BID, Mansy, Jan A, MD, 15 mg at 03/01/22 2142   cholecalciferol (VITAMIN D3) tablet 2,000 Units, 2,000 Units, Oral, Daily, Mansy, Jan A, MD, 2,000 Units at 03/01/22 4585   clopidogrel (PLAVIX) tablet 75 mg, 75 mg, Oral, Daily, Mansy, Jan A, MD, 75 mg at 03/01/22 9292   cyanocobalamin ((VITAMIN B-12)) injection 1,000 mcg, 1,000 mcg, Intramuscular, Q30 days, Mansy, Jan A, MD   docusate sodium (COLACE) capsule 100 mg, 100 mg, Oral, BID, Mansy, Jan A, MD, 100 mg at 03/01/22 2141   DULoxetine (CYMBALTA) DR capsule 60 mg, 60 mg, Oral, Daily, Mansy, Jan A, MD, 60 mg at 03/01/22 0923   enoxaparin (LOVENOX) injection 40 mg, 40 mg, Subcutaneous, Q24H, Mansy, Jan A, MD, 40 mg at 03/02/22 0753   fluticasone (FLONASE) 50 MCG/ACT nasal spray 1 spray, 1 spray, Each Nare, Daily, Mansy, Jan A, MD, 1 spray at 03/01/22 4462   fosfomycin (MONUROL) packet 3 g, 3 g, Oral, Once, Sharen Hones, MD   gabapentin (NEURONTIN) capsule 100 mg, 100 mg, Oral, BID, Sharen Hones, MD, 100 mg at 03/01/22 2141   losartan (COZAAR) tablet 50 mg, 50 mg, Oral, Daily, Mansy, Jan A, MD, 50 mg at 03/01/22 8638   magnesium hydroxide (MILK OF MAGNESIA) suspension 30 mL, 30 mL, Oral, Daily PRN, Mansy, Jan A, MD   magnesium oxide (MAG-OX) tablet 400 mg, 400 mg, Oral, Daily, Mansy, Jan A, MD, 400 mg at 03/01/22 1771   mirtazapine (REMERON SOL-TAB) disintegrating tablet 15 mg, 15 mg, Oral, QHS, Sharen Hones, MD, 15 mg at 03/01/22 2140   nystatin cream (MYCOSTATIN) 1 application., 1 application., Topical, BID, Mansy, Arvella Merles, MD, 1 application. at 03/01/22 2146   ondansetron (ZOFRAN) tablet 4 mg, 4 mg, Oral, Q6H PRN **OR** ondansetron (ZOFRAN) injection 4 mg, 4 mg, Intravenous, Q6H PRN, Mansy, Jan A, MD, 4 mg at 03/01/22 1817   ondansetron (ZOFRAN) tablet 4 mg, 4 mg, Oral, Q8H PRN, Mansy, Jan A, MD   oxyCODONE-acetaminophen (PERCOCET/ROXICET) 5-325 MG per tablet 1 tablet, 1 tablet, Oral, Q4H PRN, Sharen Hones, MD, 1 tablet at 03/01/22  2141   pantoprazole (PROTONIX) EC tablet 40 mg, 40 mg, Oral, Daily, Mansy, Jan A, MD, 40 mg at 03/01/22 1657   psyllium (HYDROCIL/METAMUCIL), , Oral, BID, Mansy, Jan A, MD, 1 packet at 03/01/22 2143   QUEtiapine (SEROQUEL) tablet 25 mg, 25 mg, Oral, QID PRN, Mansy, Jan A, MD, 25 mg at 03/01/22 2033   rosuvastatin (CRESTOR) tablet 5 mg, 5 mg, Oral, Daily, Mansy, Jan A, MD, 5 mg at 03/01/22 9038   saccharomyces boulardii (FLORASTOR) capsule 250 mg, 250 mg, Oral, BID, Mansy, Jan A, MD, 250 mg at 03/01/22 2143   sucralfate (CARAFATE) 1 GM/10ML suspension 1 g, 1 g, Oral, TID WC & HS, Sharen Hones, MD, 1 g at 03/02/22 0754   temazepam (RESTORIL)  capsule 15 mg, 15 mg, Oral, QHS PRN, Mansy, Jan A, MD, 15 mg at 03/01/22 2033   traZODone (DESYREL) tablet 25 mg, 25 mg, Oral, QHS PRN, Mansy, Arvella Merles, MD  EXAM:  GENERAL: alert, oriented, appears well and in no acute distress  HEENT: atraumatic, conjunttiva clear, no obvious abnormalities on inspection of external nose and ears  NECK: normal movements of the head and neck  LUNGS: on inspection no signs of respiratory distress, breathing rate appears normal, no obvious gross SOB, gasping or wheezing  CV: no obvious cyanosis  PSYCH/NEURO: pleasant and cooperative, no obvious depression or anxiety, speech and thought processing grossly intact  ASSESSMENT AND PLAN:  Discussed the following assessment and plan:  Problem List Items Addressed This Visit     Obstructive sleep apnea - Primary  Has been having issues getting adjusted to cpap.  Slept better last night.  The leg pump is causing increased stress.  Since she slept better last night when she did not use the pump and given decreased lower extremity swelling, have her hold the pump for the next couple of days.  Use cpa and see if can get her more used to the mask.  Then add back the pump.    HYPERTENSION:  Blood pressure has been doing well on amlodipine.  Continue amlodipine and losartan.  Follow  pressures.  No increased swelling.  Follow.   MULTIPLE SCLEROSIS:  Followed by Dr Felecia Shelling.  Has f/u soon.  Appears to be stable.   LOWER EXTREMITY SWELLING:  Improved.  Hold lymphedema pump for a couple of days as outlined, allowing her to get more used to cpap.  Treating sleep apnea, hopefully will improve lower extremity swelling.  Follow.   Return in about 1 week (around 02/28/2022) for follow up appt (73mn).   I discussed the assessment and treatment plan with the patient. The patient was provided an opportunity to ask questions and all were answered. The patient agreed with the plan and demonstrated an understanding of the instructions.   The patient was advised to call back or seek an in-person evaluation if the symptoms worsen or if the condition fails to improve as anticipated.   CEinar Pheasant MD

## 2022-02-24 DIAGNOSIS — R262 Difficulty in walking, not elsewhere classified: Secondary | ICD-10-CM | POA: Diagnosis not present

## 2022-02-24 DIAGNOSIS — M6281 Muscle weakness (generalized): Secondary | ICD-10-CM | POA: Diagnosis not present

## 2022-02-24 DIAGNOSIS — G35 Multiple sclerosis: Secondary | ICD-10-CM | POA: Diagnosis not present

## 2022-02-24 DIAGNOSIS — R279 Unspecified lack of coordination: Secondary | ICD-10-CM | POA: Diagnosis not present

## 2022-02-24 DIAGNOSIS — R2681 Unsteadiness on feet: Secondary | ICD-10-CM | POA: Diagnosis not present

## 2022-02-24 DIAGNOSIS — I69398 Other sequelae of cerebral infarction: Secondary | ICD-10-CM | POA: Diagnosis not present

## 2022-02-25 ENCOUNTER — Ambulatory Visit: Payer: Medicare Other

## 2022-02-26 ENCOUNTER — Encounter: Payer: Self-pay | Admitting: Hematology and Oncology

## 2022-02-26 ENCOUNTER — Encounter: Payer: Self-pay | Admitting: Neurology

## 2022-02-26 ENCOUNTER — Ambulatory Visit: Payer: BC Managed Care – PPO | Admitting: Neurology

## 2022-02-26 ENCOUNTER — Encounter: Payer: Medicare Other | Admitting: Dermatology

## 2022-02-26 VITALS — BP 141/80 | HR 86 | Ht 63.0 in | Wt 187.0 lb

## 2022-02-26 DIAGNOSIS — Z9989 Dependence on other enabling machines and devices: Secondary | ICD-10-CM

## 2022-02-26 DIAGNOSIS — R269 Unspecified abnormalities of gait and mobility: Secondary | ICD-10-CM

## 2022-02-26 DIAGNOSIS — G35 Multiple sclerosis: Secondary | ICD-10-CM

## 2022-02-26 DIAGNOSIS — G4733 Obstructive sleep apnea (adult) (pediatric): Secondary | ICD-10-CM | POA: Insufficient documentation

## 2022-02-26 DIAGNOSIS — F418 Other specified anxiety disorders: Secondary | ICD-10-CM

## 2022-02-26 DIAGNOSIS — G4700 Insomnia, unspecified: Secondary | ICD-10-CM

## 2022-02-26 DIAGNOSIS — Z7952 Long term (current) use of systemic steroids: Secondary | ICD-10-CM

## 2022-02-26 MED ORDER — BUSPIRONE HCL 15 MG PO TABS: 15.0000 mg | ORAL_TABLET | Freq: Two times a day (BID) | ORAL | 5 refills | Status: DC

## 2022-02-26 MED ORDER — DALFAMPRIDINE ER 10 MG PO TB12: 10.0000 mg | ORAL_TABLET | Freq: Two times a day (BID) | ORAL | 11 refills | Status: DC

## 2022-02-26 MED ORDER — TEMAZEPAM 15 MG PO CAPS: 15.0000 mg | ORAL_CAPSULE | Freq: Every evening | ORAL | 5 refills | Status: DC | PRN

## 2022-02-26 NOTE — Patient Instructions (Signed)
Begin temazepam at bedtime ?Cut mirtazapine down to one half pill at bedtime for 1 week then stop ? ?Try buspirone 15 mg twice daily.   ?

## 2022-02-26 NOTE — Progress Notes (Signed)
? ?[facility] ? ?PATIENT: Carly [name] ?DOB: 10-Feb-1951 ? ?REFERRING DOCTOR OR PCP: Einar Pheasant, MD ?SOURCE: Patient, notes from neurology Larence Penning, [place], [facility] neurology), imaging and lab reports, MRI images personally reviewed ? ?_________________________________ ? ? ?HISTORICAL ? ?CHIEF COMPLAINT:  ?Chief Complaint  ?Patient presents with  ? Follow-up  ?  Pt with Carly [name], rm 35, she has been battling insurance/SS issues, recently moved into retirement community. In Jan developed covid and bronchitis after that. Multiple life stressors that have caused increase in depression/stress which has been bothersome.  ? ? ?HISTORY OF PRESENT ILLNESS:  ?Carly [name] is a 71 y.o. woman. 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 ? ?Update 02/26/2022: ?She feels her PPMS is mostly stable to minimally worse neurologically.  However, she is having a lot more anxiety, much worse than last visit.  She is on duloxetine 60 mg daily, mirtazapine 30 mg nightly and Seroquel 25 mg nightly (and 12.5 mg during the day at times).   Years ago was on Buspar but stopped as she had some headaches.   She does not recall if it helped.   At the last visit I had prescribed BuSpar but she only took 1 day.  She did not have any problems tolerating it that day but stopped since she had had headaches when she tried it in the past .  The anxiety is throughout the day and it contributes to her insomnia.  She feels she has a lot going on.  [place].  She has recent Covid-19.  CPAP was started recently and she is trying to get used to a new CPAP mask.    ? ?Currently, gait is poor and she needs a walker for short distances (< 50 feet) but wheelchair outside the house.  She has left > right leg weakness and spasticity.   She denies numbness or tingling   Bladder is fine.   She has reduced vision ? ?She was diagnosed with OSA and just started Auto-PAP lin  January 2023 with [facility] HST 08/08/2021 showed AHI = 44.    She sees Dr. Raul Del for the OSA.   She has excessive daytime sleepiness and feels it may be better on the CPAP.  She has tried a couple different masks and feels the current mask has done best..     She uses CPAP 7 hours a night recently.   She has changed masks and feel current one is a little better.    ? ?She has gained weight over the last year.  Of note, she is on both mirtazapine and Seroquel. ? ? ?MS HISTORY: ?She was diagnosed with MS in 2012 after presenting with progressive left greater than right leg weakness and reduced gait.  She had an MRI of the brain and was found to have severe white matter changes.  In retrospect, she noted that she had difficulties with her left leg including a limp for many years t before the MRI, much worse in hot weather.     She felt she fluctuated a lot without much progression for many years before she started to progress more continuously..     Because of the severe extent of white matter changes, not completely typical for MS alternative diagnoses including CADASIL were considered.  The notch 3 PCR was negative.  CSF showed greater than 5 oligoclonal bands.  Therefore, she was diagnosed with primary progressive MS as her time course was more  consistent with that then secondary progressive MS.  She had earlier been placed on Tysabri (Dr. Jacqulynn Cadet) around 2014 for a couple years and then switched to [facility] in 2017.  However she felt that she progressed more well on Ocrevus then not on the medication and it was stopped after 4 courses.  She more recently has been seeing Dr. Cristie Hem and is on once a month IV Solu-Medrol.  She does not think it is helping much. ? ?She had a small stroke 11/28/2020 involving the deep white matter of the left frontal  lobe causing right arm weakness    She did have some improvement. ? ? ?Imaging: ?MRI of the cervical spine 12/19/2010 showed foci adjacent to C2 to the left, C2-C3  posteriorly C3-C4 posterolaterally to the left, C5-C6 laterally bilaterally  ? ?The MRI was followed up with an MRI of the brain 01/07/2011.  And MRI 03/12/2016 both showing a stable pattern of severe white matter hyperintense signal changes with some involvement of the anterior temporal lobes, external capsules and basal ganglia.  The pattern is fairly symmetric.  She also has moderate cortical atrophy. ? ?MRI of the cervical and thoracic spine 04/02/2018 show a similar extent of plaque in the cervical spine as in 2012 though some of the foci are more apparent.  The thoracic spine shows multiple foci including a large focus laterally to the left adjacent to T8-T9 likely playing a role in her left leg weakness ? ?MRI of the brain 11/28/2020 shows a mostly stable pattern of severe white matter change and a couple foci in the pons and left cerebellar hemisphere.  The MRI also showed a small stroke in the left corona radiata with DWI changes much more consistent with infarction than MS.  Additionally there is atrophy that have progressed mildly compared to 2017. ? ?The MR angiogram 12/09/2020 showed mild stenosis in the PCAs but no major stenosis or occlusion. ? ? ? ?REVIEW OF SYSTEMS: ?Constitutional: No fevers, chills, sweats, or change in appetite.  She has insomnia. ?Eyes: No visual changes, double vision, eye pain ?Ear, nose and throat: No hearing loss, ear pain, nasal congestion, sore throat ?Cardiovascular: No chest pain, palpitations ?Respiratory:  No shortness of breath at rest or with exertion.   No wheezes ?GastrointestinaI: No nausea, vomiting, diarrhea, abdominal pain, fecal incontinence ?Genitourinary:  No dysuria, urinary retention or frequency.  No nocturia. ?Musculoskeletal:  No neck pain, back pain ?Integumentary: No rash, pruritus, skin lesions.  She has ankle edema, left greater than right ?Neurological: as above ?Psychiatric: She has severe anxiety. ?Endocrine: No palpitations, diaphoresis, change in  appetite, change in weigh or increased thirst ?Hematologic/Lymphatic:  No anemia, purpura, petechiae. ?Allergic/Immunologic: No itchy/runny eyes, nasal congestion, recent allergic reactions, rashes ? ?ALLERGIES: ?Allergies  ?Allergen Reactions  ? Ibuprofen Swelling and Other (See Comments)  ? Sulfamethoxazole-Trimethoprim Itching  ? Penicillin G Rash  ? Asa [Aspirin] Swelling  ? Buspirone Other (See Comments)  ?  Headaches ? ?  ? Levofloxacin Other (See Comments)  ?  "weakness"  ? Lexapro [Escitalopram Oxalate] Other (See Comments)  ?  Weakness ?  ? Pollen Extract Hives  ? Ceftin [Cefuroxime Axetil] Rash  ? Penicillins Rash  ? Sulfa Antibiotics Rash  ? ? ?HOME MEDICATIONS: ? ?Current Outpatient Medications:  ?  acetaminophen (TYLENOL) 500 MG tablet, Take 1 tablet (500 mg total) by mouth every 4 (four) hours as needed., Disp: 30 tablet, Rfl: 0 ?  albuterol (VENTOLIN HFA) 108 (90 Base) MCG/ACT inhaler, Inhale  2 puffs into the lungs every 6 (six) hours as needed., Disp: 18 g, Rfl: 0 ?  amLODipine (NORVASC) 5 MG tablet, TAKE 1 TABLET BY MOUTH DAILY., Disp: 90 tablet, Rfl: 1 ?  Baclofen 5 MG TABS, Take 1 tablet by mouth 3 (three) times daily., Disp: , Rfl:  ?  busPIRone (BUSPAR) 15 MG tablet, Take 1 tablet (15 mg total) by mouth 2 (two) times daily., Disp: 60 tablet, Rfl: 5 ?  Cholecalciferol (D3-1000 PO), Take 2,000 Units by mouth daily., Disp: , Rfl:  ?  clopidogrel (PLAVIX) 75 MG tablet, TAKE 1 TABLET (75 MG) BY MOUTH EVERY DAY, Disp: 90 tablet, Rfl: 1 ?  Coenzyme Q10 (CO Q10) 100 MG CAPS, Take 1 capsule by mouth daily., Disp: , Rfl:  ?  cyanocobalamin (,VITAMIN B-12,) 1000 MCG/ML injection, Inject 1 mL (1,000 mcg total) into the muscle every 30 (thirty) days., Disp: 1 mL, Rfl: 3 ?  docusate sodium (COLACE) 100 MG capsule, Take 100 mg by mouth 2 (two) times daily., Disp: , Rfl:  ?  DULoxetine (CYMBALTA) 60 MG capsule, TAKE 1 CAPSULE EVERY DAY, Disp: 90 capsule, Rfl: 1 ?  DULoxetine (CYMBALTA) 60 MG capsule, Take 1  capsule (60 mg total) by mouth daily., Disp: 90 capsule, Rfl: 3 ?  fluticasone (FLONASE) 50 MCG/ACT nasal spray, Place 1 spray into both nostrils daily., Disp: 16 g, Rfl: 2 ?  losartan (COZAAR) 50 MG tablet, TAK

## 2022-02-27 ENCOUNTER — Ambulatory Visit: Payer: Medicare Other

## 2022-02-28 ENCOUNTER — Other Ambulatory Visit: Payer: Self-pay

## 2022-02-28 ENCOUNTER — Telehealth (INDEPENDENT_AMBULATORY_CARE_PROVIDER_SITE_OTHER): Payer: Medicare Other | Admitting: Internal Medicine

## 2022-02-28 ENCOUNTER — Encounter: Payer: Self-pay | Admitting: Internal Medicine

## 2022-02-28 DIAGNOSIS — M79606 Pain in leg, unspecified: Secondary | ICD-10-CM | POA: Diagnosis not present

## 2022-02-28 DIAGNOSIS — B9620 Unspecified Escherichia coli [E. coli] as the cause of diseases classified elsewhere: Secondary | ICD-10-CM | POA: Diagnosis present

## 2022-02-28 DIAGNOSIS — R739 Hyperglycemia, unspecified: Secondary | ICD-10-CM | POA: Diagnosis not present

## 2022-02-28 DIAGNOSIS — K219 Gastro-esophageal reflux disease without esophagitis: Secondary | ICD-10-CM | POA: Diagnosis present

## 2022-02-28 DIAGNOSIS — Z743 Need for continuous supervision: Secondary | ICD-10-CM | POA: Diagnosis not present

## 2022-02-28 DIAGNOSIS — Z880 Allergy status to penicillin: Secondary | ICD-10-CM | POA: Diagnosis not present

## 2022-02-28 DIAGNOSIS — Z8619 Personal history of other infectious and parasitic diseases: Secondary | ICD-10-CM | POA: Diagnosis not present

## 2022-02-28 DIAGNOSIS — F32A Depression, unspecified: Secondary | ICD-10-CM | POA: Diagnosis present

## 2022-02-28 DIAGNOSIS — E7800 Pure hypercholesterolemia, unspecified: Secondary | ICD-10-CM

## 2022-02-28 DIAGNOSIS — B9689 Other specified bacterial agents as the cause of diseases classified elsewhere: Secondary | ICD-10-CM | POA: Diagnosis present

## 2022-02-28 DIAGNOSIS — G35 Multiple sclerosis: Secondary | ICD-10-CM

## 2022-02-28 DIAGNOSIS — Z9989 Dependence on other enabling machines and devices: Secondary | ICD-10-CM

## 2022-02-28 DIAGNOSIS — F339 Major depressive disorder, recurrent, unspecified: Secondary | ICD-10-CM | POA: Diagnosis not present

## 2022-02-28 DIAGNOSIS — Z9049 Acquired absence of other specified parts of digestive tract: Secondary | ICD-10-CM | POA: Diagnosis not present

## 2022-02-28 DIAGNOSIS — F411 Generalized anxiety disorder: Secondary | ICD-10-CM | POA: Diagnosis not present

## 2022-02-28 DIAGNOSIS — Z882 Allergy status to sulfonamides status: Secondary | ICD-10-CM

## 2022-02-28 DIAGNOSIS — I890 Lymphedema, not elsewhere classified: Secondary | ICD-10-CM | POA: Diagnosis present

## 2022-02-28 DIAGNOSIS — M79605 Pain in left leg: Secondary | ICD-10-CM | POA: Diagnosis not present

## 2022-02-28 DIAGNOSIS — Z79899 Other long term (current) drug therapy: Secondary | ICD-10-CM

## 2022-02-28 DIAGNOSIS — Z86010 Personal history of colonic polyps: Secondary | ICD-10-CM

## 2022-02-28 DIAGNOSIS — N390 Urinary tract infection, site not specified: Principal | ICD-10-CM | POA: Diagnosis present

## 2022-02-28 DIAGNOSIS — Z886 Allergy status to analgesic agent status: Secondary | ICD-10-CM | POA: Diagnosis not present

## 2022-02-28 DIAGNOSIS — K5900 Constipation, unspecified: Secondary | ICD-10-CM

## 2022-02-28 DIAGNOSIS — I10 Essential (primary) hypertension: Secondary | ICD-10-CM | POA: Diagnosis not present

## 2022-02-28 DIAGNOSIS — Z9109 Other allergy status, other than to drugs and biological substances: Secondary | ICD-10-CM

## 2022-02-28 DIAGNOSIS — G629 Polyneuropathy, unspecified: Secondary | ICD-10-CM | POA: Diagnosis present

## 2022-02-28 DIAGNOSIS — Z66 Do not resuscitate: Secondary | ICD-10-CM | POA: Diagnosis present

## 2022-02-28 DIAGNOSIS — M79604 Pain in right leg: Secondary | ICD-10-CM | POA: Diagnosis present

## 2022-02-28 DIAGNOSIS — G8929 Other chronic pain: Secondary | ICD-10-CM | POA: Diagnosis present

## 2022-02-28 DIAGNOSIS — Z8673 Personal history of transient ischemic attack (TIA), and cerebral infarction without residual deficits: Secondary | ICD-10-CM | POA: Diagnosis not present

## 2022-02-28 DIAGNOSIS — G4733 Obstructive sleep apnea (adult) (pediatric): Secondary | ICD-10-CM | POA: Diagnosis present

## 2022-02-28 DIAGNOSIS — Z888 Allergy status to other drugs, medicaments and biological substances status: Secondary | ICD-10-CM

## 2022-02-28 DIAGNOSIS — R600 Localized edema: Secondary | ICD-10-CM | POA: Diagnosis not present

## 2022-02-28 DIAGNOSIS — G43909 Migraine, unspecified, not intractable, without status migrainosus: Secondary | ICD-10-CM | POA: Diagnosis present

## 2022-02-28 DIAGNOSIS — M79661 Pain in right lower leg: Secondary | ICD-10-CM | POA: Diagnosis not present

## 2022-02-28 DIAGNOSIS — E785 Hyperlipidemia, unspecified: Secondary | ICD-10-CM | POA: Diagnosis not present

## 2022-02-28 DIAGNOSIS — F419 Anxiety disorder, unspecified: Secondary | ICD-10-CM | POA: Diagnosis present

## 2022-02-28 DIAGNOSIS — Z881 Allergy status to other antibiotic agents status: Secondary | ICD-10-CM | POA: Diagnosis not present

## 2022-02-28 DIAGNOSIS — M79662 Pain in left lower leg: Secondary | ICD-10-CM | POA: Diagnosis not present

## 2022-02-28 DIAGNOSIS — N3000 Acute cystitis without hematuria: Secondary | ICD-10-CM | POA: Diagnosis not present

## 2022-02-28 DIAGNOSIS — Z7902 Long term (current) use of antithrombotics/antiplatelets: Secondary | ICD-10-CM

## 2022-02-28 LAB — COMPREHENSIVE METABOLIC PANEL WITH GFR
ALT: 26 U/L (ref 0–44)
AST: 25 U/L (ref 15–41)
Albumin: 4.4 g/dL (ref 3.5–5.0)
Alkaline Phosphatase: 91 U/L (ref 38–126)
Anion gap: 11 (ref 5–15)
BUN: 12 mg/dL (ref 8–23)
CO2: 24 mmol/L (ref 22–32)
Calcium: 10.1 mg/dL (ref 8.9–10.3)
Chloride: 105 mmol/L (ref 98–111)
Creatinine, Ser: 0.56 mg/dL (ref 0.44–1.00)
GFR, Estimated: >60 mL/min
Glucose, Bld: 121 mg/dL — ABNORMAL HIGH (ref 70–99)
Potassium: 4.2 mmol/L (ref 3.5–5.1)
Sodium: 140 mmol/L (ref 135–145)
Total Bilirubin: 0.4 mg/dL (ref 0.3–1.2)
Total Protein: 7.3 g/dL (ref 6.5–8.1)

## 2022-02-28 LAB — CBC
HCT: 40.1 % (ref 36.0–46.0)
Hemoglobin: 13.1 g/dL (ref 12.0–15.0)
MCH: 27.3 pg (ref 26.0–34.0)
MCHC: 32.7 g/dL (ref 30.0–36.0)
MCV: 83.5 fL (ref 80.0–100.0)
Platelets: 304 10*3/uL (ref 150–400)
RBC: 4.8 MIL/uL (ref 3.87–5.11)
RDW: 15.8 % — ABNORMAL HIGH (ref 11.5–15.5)
WBC: 10.3 10*3/uL (ref 4.0–10.5)
nRBC: 0 % (ref 0.0–0.2)

## 2022-02-28 LAB — BRAIN NATRIURETIC PEPTIDE: B Natriuretic Peptide: 14.1 pg/mL (ref 0.0–100.0)

## 2022-02-28 NOTE — Progress Notes (Unsigned)
Patient ID: Carly [name], female   DOB: 1950-11-26, 71 y.o.   MRN: [id number]   Virtual Visit via video Note  This visit type was conducted due to national recommendations for restrictions regarding the COVID-19 pandemic (e.g. social distancing).  This format is felt to be most appropriate for this patient at this time.  All issues noted in this document were discussed and addressed.  No physical exam was performed (except for noted visual exam findings with Video Visits).   I connected with Carly [name] by a video enabled telemedicine application and verified that I am speaking with the correct person using two identifiers. Location patient: home Location provider: work  Persons participating in the virtual visit: patient, provider and pts caretaker - Christa.    The limitations, risks, security and privacy concerns of performing an evaluation and management service by video and the availability of in person appointments have been discussed.  It has also been discussed with the patient that there may be a patient responsible charge related to this service. The patient expressed understanding and agreed to proceed.   Reason for visit: follow up appt  HPI: Follow up regarding sleep apnea.  She is using her cpap more regular now.  Tolerating better.  Sleeping better.  Has been having issues lower extremity swelling previously.  Has been trying to use lymphedema pump.  Apparently was not working properly.  She reported increased pain in her legs.  Pump has been fixed now.  Part replaced.  Her pain has been in different places on her legs - currently involving the top of her thigh.  Caretaker reports no increased swelling or redness.  No increased cough or congestion.  No chest pain or sob.  Increased anxiety with her son's work issues.  Increased stress related to her lymphedema pump.  Also recently saw Dr Felecia Shelling.  Adjusting some of her medication.  She is tapering her remeron.  Had gained  weight.  He put her on temazepam.  Also added back buspar.  She only took 7.'5mg'$  today.  Felt '15mg'$  was too much.  Blood pressure elevated today.   ROS: See pertinent positives and negatives per HPI.  Past Medical History:  Diagnosis Date   Allergy    Anxiety    Aspiration pneumonia (HCC)    Depression    Frequent headaches    H/O   GERD (gastroesophageal reflux disease)    History of chicken pox    History of colon polyps    Hx of migraines    Multiple sclerosis ([place]) 2011   OSA (obstructive sleep apnea)    PONV (postoperative nausea and vomiting)     Past Surgical History:  Procedure Laterality Date   BACK SURGERY     CHOLECYSTECTOMY     COLONOSCOPY WITH PROPOFOL N/A 05/18/2017   Procedure: COLONOSCOPY WITH PROPOFOL;  Surgeon: [name], MD;  Location[facility] ENDOSCOPY;  Service: Endoscopy;  Laterality: N/A;   FOOT SURGERY  2015   GALLBLADDER SURGERY  2008   HARDWARE REMOVAL Left 02/14/2016   Procedure: LEFT FOOT REMOVAL DEEP IMPLANT;  Surgeon: [name], MD;  Location: [place];  Service: Orthopedics;  Laterality: Left;   HERNIA REPAIR     Inguinal Hernia Repair   SPINE SURGERY  2014    Family History  Problem Relation Age of Onset   Arthritis Mother    Hypertension Mother    Macular degeneration Mother    Hypertension Father    Hyperlipidemia Father  Heart disease Maternal Grandfather    Diabetes Maternal Grandfather    Kidney disease Paternal Grandmother     SOCIAL HX: reviewed.   No current facility-administered medications for this visit. No current outpatient medications on file.  Facility-Administered Medications Ordered in Other Visits:    0.9 %  sodium chloride infusion, , Intravenous, Continuous, Corcoran, Melissa C, MD, Last Rate: 10 mL/hr at 04/25/16 0855, New Bag at 04/25/16 0855   acetaminophen (TYLENOL) tablet 650 mg, 650 mg, Oral, Q6H PRN **OR** acetaminophen (TYLENOL) suppository 650 mg, 650 mg, Rectal, Q6H PRN, Mansy, Jan  A, MD   acetaminophen (TYLENOL) tablet 650 mg, 650 mg, Oral, Once, Corcoran, Melissa C, MD   albuterol (PROVENTIL) (2.5 MG/3ML) 0.083% nebulizer solution 2.5 mg, 2.5 mg, Nebulization, Q6H PRN, Renda Rolls, RPH   amLODipine (NORVASC) tablet 5 mg, 5 mg, Oral, Daily, Mansy, Jan A, MD, 5 mg at 03/01/22 0093   azelastine (ASTELIN) 0.1 % nasal spray 1 spray, 1 spray, Each Nare, BID, Mansy, Jan A, MD, 1 spray at 03/01/22 8182   baclofen (LIORESAL) tablet 5 mg, 5 mg, Oral, TID, Mansy, Jan A, MD, 5 mg at 03/01/22 1511   busPIRone (BUSPAR) tablet 15 mg, 15 mg, Oral, BID, Mansy, Jan A, MD, 15 mg at 03/01/22 0925   [START ON 03/02/2022] cefTRIAXone (ROCEPHIN) 1 g in sodium chloride 0.9 % 100 mL IVPB, 1 g, Intravenous, Q24H, Mansy, Jan A, MD   cholecalciferol (VITAMIN D3) tablet 2,000 Units, 2,000 Units, Oral, Daily, Mansy, Jan A, MD, 2,000 Units at 03/01/22 9937   clopidogrel (PLAVIX) tablet 75 mg, 75 mg, Oral, Daily, Mansy, Jan A, MD, 75 mg at 03/01/22 0925   [START ON 03/02/2022] cyanocobalamin ((VITAMIN B-12)) injection 1,000 mcg, 1,000 mcg, Intramuscular, Q30 days, Mansy, Jan A, MD   docusate sodium (COLACE) capsule 100 mg, 100 mg, Oral, BID, Mansy, Jan A, MD, 100 mg at 03/01/22 1696   DULoxetine (CYMBALTA) DR capsule 60 mg, 60 mg, Oral, Daily, Mansy, Jan A, MD, 60 mg at 03/01/22 7893   enoxaparin (LOVENOX) injection 40 mg, 40 mg, Subcutaneous, Q24H, Mansy, Jan A, MD, 40 mg at 03/01/22 0919   fluticasone (FLONASE) 50 MCG/ACT nasal spray 1 spray, 1 spray, Each Nare, Daily, Mansy, Jan A, MD, 1 spray at 03/01/22 8101   gabapentin (NEURONTIN) capsule 100 mg, 100 mg, Oral, BID, Sharen Hones, MD, 100 mg at 03/01/22 1242   losartan (COZAAR) tablet 50 mg, 50 mg, Oral, Daily, Mansy, Jan A, MD, 50 mg at 03/01/22 7510   magnesium hydroxide (MILK OF MAGNESIA) suspension 30 mL, 30 mL, Oral, Daily PRN, Mansy, Jan A, MD   magnesium oxide (MAG-OX) tablet 400 mg, 400 mg, Oral, Daily, Mansy, Jan A, MD, 400 mg at 03/01/22  2585   mirtazapine (REMERON SOL-TAB) disintegrating tablet 15 mg, 15 mg, Oral, QHS, Sharen Hones, MD   nystatin cream (MYCOSTATIN) 1 application., 1 application., Topical, BID, Mansy, Arvella Merles, MD, 1 application. at 03/01/22 0926   ondansetron (ZOFRAN) tablet 4 mg, 4 mg, Oral, Q6H PRN **OR** ondansetron (ZOFRAN) injection 4 mg, 4 mg, Intravenous, Q6H PRN, Mansy, Jan A, MD, 4 mg at 03/01/22 1817   ondansetron (ZOFRAN) tablet 4 mg, 4 mg, Oral, Q8H PRN, Mansy, Jan A, MD   oxyCODONE-acetaminophen (PERCOCET/ROXICET) 5-325 MG per tablet 1 tablet, 1 tablet, Oral, Q4H PRN, Sharen Hones, MD, 1 tablet at 03/01/22 1725   pantoprazole (PROTONIX) EC tablet 40 mg, 40 mg, Oral, Daily, Mansy, Jan A, MD, 40 mg at 03/01/22  2130   psyllium (HYDROCIL/METAMUCIL), , Oral, BID, Mansy, Jan A, MD, 1 packet at 03/01/22 0931   QUEtiapine (SEROQUEL) tablet 25 mg, 25 mg, Oral, QID PRN, Mansy, Jan A, MD   rosuvastatin (CRESTOR) tablet 5 mg, 5 mg, Oral, Daily, Mansy, Jan A, MD, 5 mg at 03/01/22 8657   saccharomyces boulardii (FLORASTOR) capsule 250 mg, 250 mg, Oral, BID, Mansy, Jan A, MD, 250 mg at 03/01/22 0925   sucralfate (CARAFATE) 1 GM/10ML suspension 1 g, 1 g, Oral, TID WC & HS, Sharen Hones, MD   temazepam (RESTORIL) capsule 15 mg, 15 mg, Oral, QHS PRN, Mansy, Jan A, MD   traZODone (DESYREL) tablet 25 mg, 25 mg, Oral, QHS PRN, Mansy, Arvella Merles, MD  EXAM:  GENERAL: alert, oriented, appears well and in no acute distress  HEENT: atraumatic, conjunttiva clear, no obvious abnormalities on inspection of external nose and ears  NECK: normal movements of the head and neck  LUNGS: on inspection no signs of respiratory distress, breathing rate appears normal, no obvious gross SOB, gasping or wheezing  CV: no obvious cyanosis  PSYCH/NEURO: pleasant and cooperative, no obvious depression or anxiety, speech and thought processing grossly intact  ASSESSMENT AND PLAN:  Discussed the following assessment and plan:  Problem List  Items Addressed This Visit     Constipation    Bowels doing better.  Follow.        Depression, recurrent ([place])    Followed by psychiatry.  On seroquel.  Dr Felecia Shelling is tapering her remeron.  Had gained weight.  Started her on temazepam.  Also has buspar.  Discussed dosing of buspar.  Felt '15mg'$  too much.  Took 7.'5mg'$  this am.  Discussed taking 7.'5mg'$  tid.  Follow.         Generalized anxiety disorder    Followed by psychiatry.  Medication changes per Dr Felecia Shelling as outlined.        History of CVA (cerebrovascular accident)    Continue risk factor modification.  Has been tolerating statin. Follow leg pain.  She feels worsened with the pump.  Continue plavix.  Discussed and she understands risk of stopping plavix for procedures.   Follow.          Hypercholesterolemia    Continue crestor.  Low cholesterol diet and exercise.  Follow lipid panel and liver function tests.        Hyperglycemia    Low-carb diet and exercise.  Follow metabolic panel and Q4O.       Hypertension    Blood pressure elevated today.  Checked just prior to her video visit.  Increased stress and anxiety as outlined.  Reports feeling better after we talked.  Will continue current medication (amlodipine).  Have her spot check her pressure.  Schedule f/u soon to reassess.         Lower extremity edema    Per caretaker, no increased swelling.  Follow.        Multiple sclerosis ([place])    Just saw Dr Felecia Shelling.  Stable.         OSA on CPAP    She is doing better tolerating cpap.  Continue.          Return in about 1 week (around 03/07/2022) for follow up appt (80mn) - ok if virtual.   I discussed the assessment and treatment plan with the patient. The patient was provided an opportunity to ask questions and all were answered. The patient agreed with the plan and demonstrated an understanding of  the instructions.   The patient was advised to call back or seek an in-person evaluation if the symptoms worsen or if  the condition fails to improve as anticipated.   Einar Pheasant, MD

## 2022-02-28 NOTE — ED Triage Notes (Addendum)
Pt presents to ER via [facility] from home c/o BIL upper leg pain that started around 0800 this morning.  Hx of MS, and stroke.  Used baclofen and motrin without relief.    EMS VS: HR 102, 95% RA, 146/99, 20 RR.    Pt states pain started yesterday and was much worse today.  Pt states pain is all over both legs at this time.  Pt noted to have swelling to left foot.  No pain on palpation to legs.  Pt A&O x4 at this time in NAD.

## 2022-03-01 ENCOUNTER — Encounter: Payer: Self-pay | Admitting: Internal Medicine

## 2022-03-01 ENCOUNTER — Inpatient Hospital Stay
Admission: EM | Admit: 2022-03-01 | Discharge: 2022-03-02 | DRG: 690 | Disposition: A | Discharge status: Home Health | Payer: Medicare Other | Attending: Internal Medicine | Admitting: Internal Medicine

## 2022-03-01 ENCOUNTER — Observation Stay: Payer: Medicare Other

## 2022-03-01 DIAGNOSIS — F419 Anxiety disorder, unspecified: Secondary | ICD-10-CM | POA: Diagnosis present

## 2022-03-01 DIAGNOSIS — R52 Pain, unspecified: Secondary | ICD-10-CM

## 2022-03-01 DIAGNOSIS — N390 Urinary tract infection, site not specified: Principal | ICD-10-CM

## 2022-03-01 DIAGNOSIS — Z66 Do not resuscitate: Secondary | ICD-10-CM | POA: Diagnosis present

## 2022-03-01 DIAGNOSIS — I10 Essential (primary) hypertension: Secondary | ICD-10-CM | POA: Diagnosis present

## 2022-03-01 DIAGNOSIS — B9620 Unspecified Escherichia coli [E. coli] as the cause of diseases classified elsewhere: Secondary | ICD-10-CM | POA: Diagnosis present

## 2022-03-01 DIAGNOSIS — Z9049 Acquired absence of other specified parts of digestive tract: Secondary | ICD-10-CM | POA: Diagnosis not present

## 2022-03-01 DIAGNOSIS — G4733 Obstructive sleep apnea (adult) (pediatric): Secondary | ICD-10-CM | POA: Diagnosis present

## 2022-03-01 DIAGNOSIS — B9689 Other specified bacterial agents as the cause of diseases classified elsewhere: Secondary | ICD-10-CM | POA: Diagnosis present

## 2022-03-01 DIAGNOSIS — G35 Multiple sclerosis: Secondary | ICD-10-CM

## 2022-03-01 DIAGNOSIS — M79661 Pain in right lower leg: Secondary | ICD-10-CM | POA: Diagnosis not present

## 2022-03-01 DIAGNOSIS — G8929 Other chronic pain: Secondary | ICD-10-CM | POA: Diagnosis present

## 2022-03-01 DIAGNOSIS — K219 Gastro-esophageal reflux disease without esophagitis: Secondary | ICD-10-CM | POA: Diagnosis present

## 2022-03-01 DIAGNOSIS — M79605 Pain in left leg: Secondary | ICD-10-CM | POA: Diagnosis present

## 2022-03-01 DIAGNOSIS — Z86010 Personal history of colonic polyps: Secondary | ICD-10-CM | POA: Diagnosis not present

## 2022-03-01 DIAGNOSIS — F32A Depression, unspecified: Secondary | ICD-10-CM | POA: Diagnosis present

## 2022-03-01 DIAGNOSIS — G629 Polyneuropathy, unspecified: Secondary | ICD-10-CM | POA: Diagnosis present

## 2022-03-01 DIAGNOSIS — G43909 Migraine, unspecified, not intractable, without status migrainosus: Secondary | ICD-10-CM | POA: Diagnosis present

## 2022-03-01 DIAGNOSIS — M79604 Pain in right leg: Secondary | ICD-10-CM

## 2022-03-01 DIAGNOSIS — Z886 Allergy status to analgesic agent status: Secondary | ICD-10-CM | POA: Diagnosis not present

## 2022-03-01 DIAGNOSIS — I890 Lymphedema, not elsewhere classified: Secondary | ICD-10-CM | POA: Diagnosis present

## 2022-03-01 DIAGNOSIS — Z881 Allergy status to other antibiotic agents status: Secondary | ICD-10-CM | POA: Diagnosis not present

## 2022-03-01 DIAGNOSIS — E785 Hyperlipidemia, unspecified: Secondary | ICD-10-CM | POA: Diagnosis present

## 2022-03-01 DIAGNOSIS — N3000 Acute cystitis without hematuria: Principal | ICD-10-CM

## 2022-03-01 DIAGNOSIS — Z882 Allergy status to sulfonamides status: Secondary | ICD-10-CM | POA: Diagnosis not present

## 2022-03-01 DIAGNOSIS — Z880 Allergy status to penicillin: Secondary | ICD-10-CM | POA: Diagnosis not present

## 2022-03-01 DIAGNOSIS — M79662 Pain in left lower leg: Secondary | ICD-10-CM | POA: Diagnosis not present

## 2022-03-01 DIAGNOSIS — R600 Localized edema: Secondary | ICD-10-CM | POA: Diagnosis not present

## 2022-03-01 DIAGNOSIS — Z8619 Personal history of other infectious and parasitic diseases: Secondary | ICD-10-CM | POA: Diagnosis not present

## 2022-03-01 LAB — URINALYSIS, COMPLETE (UACMP) WITH MICROSCOPIC
Bilirubin Urine: NEGATIVE
Glucose, UA: NEGATIVE mg/dL
Hgb urine dipstick: NEGATIVE
Ketones, ur: NEGATIVE mg/dL
Leukocytes,Ua: NEGATIVE
Nitrite: POSITIVE — AB
Protein, ur: NEGATIVE mg/dL
Specific Gravity, Urine: 1.013 (ref 1.005–1.030)
pH: 5 (ref 5.0–8.0)

## 2022-03-01 LAB — CK: Total CK: 79 U/L (ref 38–234)

## 2022-03-01 MED ORDER — AZELASTINE HCL 0.1 % NA SOLN
1.0000 | Freq: Two times a day (BID) | NASAL | Status: DC
  Administered 2022-03-01 – 2022-03-02 (×2): 1 via NASAL
  Filled 2022-03-01: qty 30

## 2022-03-01 MED ORDER — DULOXETINE HCL 60 MG PO CPEP: 60.0000 mg | ORAL_CAPSULE | Freq: Every day | ORAL | Status: DC

## 2022-03-01 MED ORDER — BUSPIRONE HCL 5 MG PO TABS
15.0000 mg | ORAL_TABLET | Freq: Two times a day (BID) | ORAL | Status: DC
Start: 2022-03-01 — End: 2022-03-02
  Administered 2022-03-01 – 2022-03-02 (×3): 15 mg via ORAL
  Filled 2022-03-01 (×3): qty 3

## 2022-03-01 MED ORDER — MORPHINE SULFATE (PF) 4 MG/ML IV SOLN
4.0000 mg | Freq: Once | INTRAVENOUS | Status: AC
  Administered 2022-03-01: 4 mg via INTRAVENOUS
  Filled 2022-03-01: qty 1

## 2022-03-01 MED ORDER — ALBUTEROL SULFATE HFA 108 (90 BASE) MCG/ACT IN AERS: 2.0000 | INHALATION_SPRAY | Freq: Four times a day (QID) | RESPIRATORY_TRACT | Status: DC | PRN

## 2022-03-01 MED ORDER — ALBUTEROL SULFATE (2.5 MG/3ML) 0.083% IN NEBU
2.5000 mg | INHALATION_SOLUTION | Freq: Four times a day (QID) | RESPIRATORY_TRACT | Status: DC | PRN
Start: 2022-03-01 — End: 2022-03-02

## 2022-03-01 MED ORDER — SACCHAROMYCES BOULARDII 250 MG PO CAPS
250.0000 mg | ORAL_CAPSULE | Freq: Two times a day (BID) | ORAL | Status: DC
  Administered 2022-03-01 – 2022-03-02 (×3): 250 mg via ORAL
  Filled 2022-03-01 (×3): qty 1

## 2022-03-01 MED ORDER — ACETAMINOPHEN 500 MG PO TABS
1000.0000 mg | ORAL_TABLET | Freq: Once | ORAL | Status: AC
  Administered 2022-03-01: 1000 mg via ORAL
  Filled 2022-03-01: qty 2

## 2022-03-01 MED ORDER — DOCUSATE SODIUM 100 MG PO CAPS
100.0000 mg | ORAL_CAPSULE | Freq: Two times a day (BID) | ORAL | Status: DC
Start: 2022-03-01 — End: 2022-03-02
  Administered 2022-03-01 – 2022-03-02 (×3): 100 mg via ORAL
  Filled 2022-03-01 (×3): qty 1

## 2022-03-01 MED ORDER — PSYLLIUM 95 % PO PACK
PACK | Freq: Two times a day (BID) | ORAL | Status: DC
Start: 2022-03-01 — End: 2022-03-02
  Administered 2022-03-01 (×2): 1 via ORAL
  Filled 2022-03-01 (×3): qty 1

## 2022-03-01 MED ORDER — ACETAMINOPHEN 325 MG PO TABS
650.0000 mg | ORAL_TABLET | Freq: Four times a day (QID) | ORAL | Status: DC | PRN
  Administered 2022-03-01 – 2022-03-02 (×2): 650 mg via ORAL
  Filled 2022-03-01 (×3): qty 2

## 2022-03-01 MED ORDER — DULOXETINE HCL 60 MG PO CPEP
60.0000 mg | ORAL_CAPSULE | Freq: Every day | ORAL | Status: DC
  Administered 2022-03-01 – 2022-03-02 (×2): 60 mg via ORAL
  Filled 2022-03-01 (×2): qty 1

## 2022-03-01 MED ORDER — BACLOFEN 10 MG PO TABS
10.0000 mg | ORAL_TABLET | Freq: Once | ORAL | Status: AC
  Administered 2022-03-01: 10 mg via ORAL
  Filled 2022-03-01: qty 1

## 2022-03-01 MED ORDER — SODIUM CHLORIDE 0.9 % IV SOLN
1.0000 g | INTRAVENOUS | Status: DC
  Administered 2022-03-02: 1 g via INTRAVENOUS
  Filled 2022-03-01: qty 10

## 2022-03-01 MED ORDER — LOSARTAN POTASSIUM 50 MG PO TABS
50.0000 mg | ORAL_TABLET | Freq: Every day | ORAL | Status: DC
  Administered 2022-03-01 – 2022-03-02 (×2): 50 mg via ORAL
  Filled 2022-03-01 (×2): qty 1

## 2022-03-01 MED ORDER — AMLODIPINE BESYLATE 5 MG PO TABS
5.0000 mg | ORAL_TABLET | Freq: Every day | ORAL | Status: DC
  Administered 2022-03-01 – 2022-03-02 (×2): 5 mg via ORAL
  Filled 2022-03-01 (×2): qty 1

## 2022-03-01 MED ORDER — OXYCODONE-ACETAMINOPHEN 5-325 MG PO TABS
1.0000 | ORAL_TABLET | ORAL | Status: DC | PRN
Start: 2022-03-01 — End: 2022-03-02
  Administered 2022-03-01 (×3): 1 via ORAL
  Filled 2022-03-01 (×3): qty 1

## 2022-03-01 MED ORDER — ONDANSETRON HCL 4 MG PO TABS: 4.0000 mg | ORAL_TABLET | Freq: Four times a day (QID) | ORAL | Status: DC | PRN

## 2022-03-01 MED ORDER — ACETAMINOPHEN 650 MG RE SUPP: 650.0000 mg | Freq: Four times a day (QID) | RECTAL | Status: DC | PRN

## 2022-03-01 MED ORDER — FENTANYL CITRATE PF 50 MCG/ML IJ SOSY
50.0000 ug | PREFILLED_SYRINGE | Freq: Once | INTRAMUSCULAR | Status: AC
  Administered 2022-03-01: 50 ug via INTRAVENOUS
  Filled 2022-03-01: qty 1

## 2022-03-01 MED ORDER — SODIUM CHLORIDE 0.9 % IV SOLN: INTRAVENOUS | Status: DC

## 2022-03-01 MED ORDER — MAGNESIUM HYDROXIDE 400 MG/5ML PO SUSP: 30.0000 mL | Freq: Every day | ORAL | Status: DC | PRN

## 2022-03-01 MED ORDER — VITAMIN D 25 MCG (1000 UNIT) PO TABS
2000.0000 [IU] | ORAL_TABLET | Freq: Every day | ORAL | Status: DC
Start: 2022-03-01 — End: 2022-03-02
  Administered 2022-03-01 – 2022-03-02 (×2): 2000 [IU] via ORAL
  Filled 2022-03-01 (×2): qty 2

## 2022-03-01 MED ORDER — MIRTAZAPINE 15 MG PO TBDP: 30.0000 mg | ORAL_TABLET | Freq: Every day | ORAL | Status: DC

## 2022-03-01 MED ORDER — MORPHINE SULFATE (PF) 2 MG/ML IV SOLN
2.0000 mg | INTRAVENOUS | Status: DC | PRN
  Administered 2022-03-01 (×2): 2 mg via INTRAVENOUS
  Filled 2022-03-01 (×2): qty 1

## 2022-03-01 MED ORDER — CYANOCOBALAMIN 1000 MCG/ML IJ SOLN
1000.0000 ug | INTRAMUSCULAR | Status: DC
  Filled 2022-03-01: qty 1

## 2022-03-01 MED ORDER — CO Q10 100 MG PO CAPS: 1.0000 | ORAL_CAPSULE | Freq: Every day | ORAL | Status: DC

## 2022-03-01 MED ORDER — CLOPIDOGREL BISULFATE 75 MG PO TABS
75.0000 mg | ORAL_TABLET | Freq: Every day | ORAL | Status: DC
  Administered 2022-03-01 – 2022-03-02 (×2): 75 mg via ORAL
  Filled 2022-03-01 (×2): qty 1

## 2022-03-01 MED ORDER — MAGNESIUM OXIDE 400 MG PO TABS
400.0000 mg | ORAL_TABLET | Freq: Every day | ORAL | Status: DC
  Administered 2022-03-01 – 2022-03-02 (×2): 400 mg via ORAL
  Filled 2022-03-01 (×4): qty 1

## 2022-03-01 MED ORDER — NYSTATIN 100000 UNIT/GM EX CREA
1.0000 "application " | TOPICAL_CREAM | Freq: Two times a day (BID) | CUTANEOUS | Status: DC
  Administered 2022-03-01 – 2022-03-02 (×3): 1 via TOPICAL
  Filled 2022-03-01: qty 30

## 2022-03-01 MED ORDER — TRAZODONE HCL 50 MG PO TABS: 25.0000 mg | ORAL_TABLET | Freq: Every evening | ORAL | Status: DC | PRN

## 2022-03-01 MED ORDER — DALFAMPRIDINE ER 10 MG PO TB12: 10.0000 mg | ORAL_TABLET | Freq: Two times a day (BID) | ORAL | Status: DC

## 2022-03-01 MED ORDER — SODIUM CHLORIDE 0.9 % IV SOLN
1.0000 g | Freq: Once | INTRAVENOUS | Status: AC
  Administered 2022-03-01: 1 g via INTRAVENOUS
  Filled 2022-03-01: qty 10

## 2022-03-01 MED ORDER — GABAPENTIN 100 MG PO CAPS
100.0000 mg | ORAL_CAPSULE | Freq: Two times a day (BID) | ORAL | Status: DC
  Administered 2022-03-01 – 2022-03-02 (×3): 100 mg via ORAL
  Filled 2022-03-01 (×3): qty 1

## 2022-03-01 MED ORDER — MIRTAZAPINE 15 MG PO TBDP
15.0000 mg | ORAL_TABLET | Freq: Every day | ORAL | Status: DC
  Administered 2022-03-01: 15 mg via ORAL
  Filled 2022-03-01: qty 1

## 2022-03-01 MED ORDER — ENOXAPARIN SODIUM 40 MG/0.4ML IJ SOSY
40.0000 mg | PREFILLED_SYRINGE | INTRAMUSCULAR | Status: DC
  Administered 2022-03-01 – 2022-03-02 (×2): 40 mg via SUBCUTANEOUS
  Filled 2022-03-01 (×2): qty 0.4

## 2022-03-01 MED ORDER — PANTOPRAZOLE SODIUM 40 MG PO TBEC
40.0000 mg | DELAYED_RELEASE_TABLET | Freq: Every day | ORAL | Status: DC
Start: 2022-03-01 — End: 2022-03-02
  Administered 2022-03-01 – 2022-03-02 (×2): 40 mg via ORAL
  Filled 2022-03-01 (×2): qty 1

## 2022-03-01 MED ORDER — LACTATED RINGERS IV BOLUS
1000.0000 mL | Freq: Once | INTRAVENOUS | Status: AC
  Administered 2022-03-01: 1000 mL via INTRAVENOUS

## 2022-03-01 MED ORDER — OXYCODONE HCL 5 MG PO TABS
5.0000 mg | ORAL_TABLET | Freq: Once | ORAL | Status: AC
  Administered 2022-03-01: 5 mg via ORAL
  Filled 2022-03-01: qty 1

## 2022-03-01 MED ORDER — DIAZEPAM 5 MG/ML IJ SOLN
2.5000 mg | Freq: Once | INTRAMUSCULAR | Status: AC
  Administered 2022-03-01: 2.5 mg via INTRAVENOUS
  Filled 2022-03-01: qty 2

## 2022-03-01 MED ORDER — SUCRALFATE 1 GM/10ML PO SUSP
1.0000 g | Freq: Three times a day (TID) | ORAL | Status: DC
  Administered 2022-03-01 – 2022-03-02 (×2): 1 g via ORAL
  Filled 2022-03-01 (×4): qty 10

## 2022-03-01 MED ORDER — QUETIAPINE FUMARATE 25 MG PO TABS
25.0000 mg | ORAL_TABLET | Freq: Four times a day (QID) | ORAL | Status: DC | PRN
Start: 2022-03-01 — End: 2022-03-02
  Administered 2022-03-01: 25 mg via ORAL
  Filled 2022-03-01: qty 1

## 2022-03-01 MED ORDER — ROSUVASTATIN CALCIUM 5 MG PO TABS
5.0000 mg | ORAL_TABLET | Freq: Every day | ORAL | Status: DC
  Administered 2022-03-01 – 2022-03-02 (×2): 5 mg via ORAL
  Filled 2022-03-01 (×2): qty 1

## 2022-03-01 MED ORDER — BACLOFEN 10 MG PO TABS
5.0000 mg | ORAL_TABLET | Freq: Three times a day (TID) | ORAL | Status: DC
  Administered 2022-03-01 – 2022-03-02 (×4): 5 mg via ORAL
  Filled 2022-03-01 (×5): qty 1

## 2022-03-01 MED ORDER — ONDANSETRON HCL 4 MG PO TABS: 4.0000 mg | ORAL_TABLET | Freq: Three times a day (TID) | ORAL | Status: DC | PRN

## 2022-03-01 MED ORDER — ONDANSETRON HCL 4 MG/2ML IJ SOLN
4.0000 mg | Freq: Four times a day (QID) | INTRAMUSCULAR | Status: DC | PRN
  Administered 2022-03-01 (×2): 4 mg via INTRAVENOUS
  Filled 2022-03-01 (×2): qty 2

## 2022-03-01 MED ORDER — FLUTICASONE PROPIONATE 50 MCG/ACT NA SUSP
1.0000 | Freq: Every day | NASAL | Status: DC
  Administered 2022-03-01 – 2022-03-02 (×2): 1 via NASAL
  Filled 2022-03-01: qty 16

## 2022-03-01 MED ORDER — TEMAZEPAM 15 MG PO CAPS
15.0000 mg | ORAL_CAPSULE | Freq: Every evening | ORAL | Status: DC | PRN
  Administered 2022-03-01: 15 mg via ORAL
  Filled 2022-03-01: qty 1

## 2022-03-01 NOTE — ED Notes (Signed)
Pt given two warm blankets.  

## 2022-03-01 NOTE — Plan of Care (Signed)

## 2022-03-01 NOTE — Assessment & Plan Note (Signed)
Continue risk factor modification.  Has been tolerating statin. Follow leg pain.  She feels worsened with the pump.  Continue plavix.  Discussed and she understands risk of stopping plavix for procedures.   Follow.

## 2022-03-01 NOTE — Assessment & Plan Note (Signed)
Continue crestor.  Low cholesterol diet and exercise. Follow lipid panel and liver function tests.   

## 2022-03-01 NOTE — Progress Notes (Signed)
  Progress Note   Patient: Carly [name] PPJ:[id number] DOB: 1950/11/05 DOA: 03/01/2022     0 DOS: the patient was seen and examined on 03/01/2022   Brief hospital course: SHAKEYLA GIEBLER is a 71 y.o. Caucasian female with medical history significant for anxiety, depression, GERD, migraine, multiple sclerosis and obstructive sleep apnea, who presented to the ER with acute onset of bilateral lower extremity pain that is acute on chronic with associated lymphedema Patient was treated with pain medicine and IV fluids.  She was also placed on antibiotics for possible UTI.  Assessment and Plan: Bilateral leg pain. Chronic bilateral lower extremity lymphedema. She has been complaining of leg pain at rest, worse with exertion. I will check both venous and arterial ultrasounds of lower extremity. Patient probably has significant neuropathy. Continue symptomatic treatment.  UTI. Continue Rocephin and pending urine culture results.  MS. Follow-up with PCP and neurology as outpatient.  Sleep apnea. Continue CPAP nightly.  Essential hypertension. Continue Norvasc     Subjective:  Currently has significant bilateral lower extremity pain, denies any dysuria or hematuria.   Physical Exam: Vitals:   03/01/22 0514 03/01/22 0523 03/01/22 0555 03/01/22 0815  BP: (!) 167/98 (!) 136/59 (!) 142/110 136/72  Pulse: 84 89 87 81  Resp: '20 18 20 16  '$ Temp: 98.5 F (36.9 C)  97.7 F (36.5 C) 97.7 F (36.5 C)  TempSrc: Oral  Oral   SpO2: 97% 96% 92% 92%  Weight:      Height:       General exam: Appears calm and comfortable  Respiratory system: Clear to auscultation. Respiratory effort normal. Cardiovascular system: S1 & S2 heard, RRR. No JVD, murmurs, rubs, gallops or clicks.  Gastrointestinal system: Abdomen is nondistended, soft and nontender. No organomegaly or masses felt. Normal bowel sounds heard. Central nervous system: Alert and oriented. No focal neurological  deficits. Extremities: Bilateral lower extremity nonpitting edema Skin: No rashes, lesions or ulcers Psychiatry: Judgement and insight appear normal. Mood & affect appropriate.   Data Reviewed:  Reviewed all lab results  Family Communication: Patient does not have a close family member to talk to.  Disposition: Status is: Observation The patient remains OBS appropriate and will d/c before 2 midnights.  Planned Discharge Destination: Home with Home Health    Time spent: no charge minutes  Author: Sharen Hones, MD 03/01/2022 10:59 AM  For on call review [email or web address].

## 2022-03-01 NOTE — Assessment & Plan Note (Signed)
-   We will continue statin therapy. 

## 2022-03-01 NOTE — Assessment & Plan Note (Signed)
-   We will continue her baclofen and dalfampridine.

## 2022-03-01 NOTE — Assessment & Plan Note (Signed)
Blood pressure elevated today.  Checked just prior to her video visit.  Increased stress and anxiety as outlined.  Reports feeling better after we talked.  Will continue current medication (amlodipine).  Have her spot check her pressure.  Schedule f/u soon to reassess.

## 2022-03-01 NOTE — Assessment & Plan Note (Signed)
-   We will continue her Norvasc.

## 2022-03-01 NOTE — ED Provider Notes (Signed)
[facility] Provider Note    Event Date/Time   First MD Initiated Contact with Patient 03/01/22 0010     (approximate)   History   Leg Pain   HPI  Carly [name] is a 71 y.o. female with a history of MS, bilateral lymphedema, chronic pain, headaches, anxiety who presents for evaluation of bilateral leg pain.  Patient reports that the pain is her chronic pain that she has from lymphedema however since yesterday the pain has been getting progressively worse.  She usually takes baclofen for that which is not helping.  She does notice that her primary care doctor made a couple changes in her home medications and the pain started after that.  She is concerned that it could be a reaction from the medications.  Review of her physicians note from 2 days ago shows that she is being weaned off of mirtazapine into temazepam and she was also restarted on BuSpar.  She denies dehydration, has been eating and drinking at home.  She denies abdominal pain, nausea or vomiting, dysuria or hematuria.     Past Medical History:  Diagnosis Date   Allergy    Anxiety    Aspiration pneumonia (HCC)    Depression    Frequent headaches    H/O   GERD (gastroesophageal reflux disease)    History of chicken pox    History of colon polyps    Hx of migraines    Multiple sclerosis ([place]) 2011   OSA (obstructive sleep apnea)    PONV (postoperative nausea and vomiting)     Past Surgical History:  Procedure Laterality Date   BACK SURGERY     CHOLECYSTECTOMY     COLONOSCOPY WITH PROPOFOL N/A 05/18/2017   Procedure: COLONOSCOPY WITH PROPOFOL;  Surgeon: [name], MD;  Location: [facility] ENDOSCOPY;  Service: Endoscopy;  Laterality: N/A;   FOOT SURGERY  2015   GALLBLADDER SURGERY  2008   HARDWARE REMOVAL Left 02/14/2016   Procedure: LEFT FOOT REMOVAL DEEP IMPLANT;  Surgeon: [name], MD;  Location: [place];  Service: Orthopedics;  Laterality: Left;   HERNIA  REPAIR     Inguinal Hernia Repair   SPINE SURGERY  2014     Physical Exam   Triage Vital Signs: ED Triage Vitals  Enc Vitals Group     BP 02/28/22 2044 (!) 160/107     Pulse Rate 02/28/22 2044 99     Resp 02/28/22 2044 18     Temp 02/28/22 2044 98.6 F (37 C)     Temp Source 02/28/22 2044 Oral     SpO2 02/28/22 2044 93 %     Weight 02/28/22 2046 183 lb (83 kg)     Height 02/28/22 2046 '5\' 3"'$  (1.6 m)     Head Circumference --      Peak Flow --      Pain Score 02/28/22 2046 7     Pain Loc --      Pain Edu? --      Excl. in [place]? --     Most recent vital signs: Vitals:   03/01/22 0130 03/01/22 0403  BP: 128/78 (!) 150/83  Pulse: 83 92  Resp: 12 15  Temp:    SpO2: 99% 94%     Constitutional: Alert and oriented, no apparent distress. HEENT:      Head: Normocephalic and atraumatic.         Eyes: Conjunctivae are normal. Sclera is non-icteric.  Mouth/Throat: Mucous membranes are moist.       Neck: Supple with no signs of meningismus. Cardiovascular: Regular rate and rhythm. No murmurs, gallops, or rubs. 2+ symmetrical distal pulses are present in all extremities.  Respiratory: Normal respiratory effort. Lungs are clear to auscultation bilaterally.  Gastrointestinal: Soft, non tender, and non distended with positive bowel sounds. No rebound or guarding. Genitourinary: No CVA tenderness. Musculoskeletal:  Patient has lower leg compression stockings b/l, that was removed and the exam shows equal swelling bilaterally, strong distal pulses, full painless range of motion of all joints, no erythema or swelling, no tenderness to palpation of the muscles of the leg Neurologic: Normal speech and language. Face is symmetric. Moving all extremities. No gross focal neurologic deficits are appreciated. Skin: Skin is warm, dry and intact. No rash noted. Psychiatric: Mood and affect are normal. Speech and behavior are normal.  ED Results / Procedures / Treatments   Labs (all labs  ordered are listed, but only abnormal results are displayed) Labs Reviewed  CBC - Abnormal; Notable for the following components:      Result Value   RDW 15.8 (*)    All other components within normal limits  COMPREHENSIVE METABOLIC PANEL - Abnormal; Notable for the following components:   Glucose, Bld 121 (*)    All other components within normal limits  URINALYSIS, COMPLETE (UACMP) WITH MICROSCOPIC - Abnormal; Notable for the following components:   Color, Urine YELLOW (*)    APPearance CLEAR (*)    Nitrite POSITIVE (*)    Bacteria, UA FEW (*)    All other components within normal limits  URINE CULTURE  BRAIN NATRIURETIC PEPTIDE  CK     EKG  none   RADIOLOGY none   PROCEDURES:  Critical Care performed: No  Procedures    IMPRESSION / MDM / ASSESSMENT AND PLAN / ED COURSE  I reviewed the triage vital signs and the nursing notes.  71 y.o. female with a history of MS, bilateral lymphedema, chronic pain, headaches, anxiety who presents for evaluation of bilateral leg pain.  Patient arrives with worsening bilateral lower extremity pain that is similar to her lymphedema pain however more severe today.  Exam of the legs is unremarkable other than chronic changes of lymphedema, there is no signs of cellulitis, there is no asymmetric leg swelling, there is no tenderness to palpation of the muscles, there is no swelling, erythema, or pain with range of motion of any of the joints, there is no signs of cyanosis, there is strong distal pulses bilaterally.  Patient has missed one of the doses of her baclofen today due to a doctor's appointment  Ddx: Exacerbation of her chronic pain, medication side effect, dehydration, rhabdo, UTI   Plan: CBC, BMP, CK.  We will give her dose of baclofen, dose of IV Valium for muscle relaxant and fluids   MEDICATIONS GIVEN IN ED: Medications  cefTRIAXone (ROCEPHIN) 1 g in sodium chloride 0.9 % 100 mL IVPB (1 g Intravenous New Bag/Given 03/01/22  0403)  lactated ringers bolus 1,000 mL (0 mLs Intravenous Stopped 03/01/22 0130)  diazepam (VALIUM) injection 2.5 mg (2.5 mg Intravenous Given 03/01/22 0029)  baclofen (LIORESAL) tablet 10 mg (10 mg Oral Given 03/01/22 0120)  acetaminophen (TYLENOL) tablet 1,000 mg (1,000 mg Oral Given 03/01/22 0207)  oxyCODONE (Oxy IR/ROXICODONE) immediate release tablet 5 mg (5 mg Oral Given 03/01/22 0207)  fentaNYL (SUBLIMAZE) injection 50 mcg (50 mcg Intravenous Given 03/01/22 0249)  morphine (PF) 4 MG/ML injection 4  mg (4 mg Intravenous Given 03/01/22 0404)     ED COURSE: CK with no signs of rhabdo, no signs of dehydration or electrolyte derangements.  UA is positive for UTI which is most likely what is exacerbating her pain.  Patient has received several rounds of narcotics and continues to have intractable pain.  Patient also given Rocephin for UTI.  Hospitalist service was consulted due to intractable pain and after discussion has accepted patient to their service   Consults: Hospitalist   EMR reviewed including records from her last visit with her primary care doctor from 3 days ago for her MS    FINAL CLINICAL IMPRESSION(S) / ED DIAGNOSES   Final diagnoses:  Acute cystitis without hematuria  Intractable pain     Rx / DC Orders   ED Discharge Orders     None        Note:  This document was prepared using Dragon voice recognition software and may include unintentional dictation errors.   Please note:  Patient was evaluated in Emergency Department today for the symptoms described in the history of present illness. Patient was evaluated in the context of the global COVID-19 pandemic, which necessitated consideration that the patient might be at risk for infection with the SARS-CoV-2 virus that causes COVID-19. Institutional protocols and algorithms that pertain to the evaluation of patients at risk for COVID-19 are in a state of rapid change based on information released by regulatory bodies  including the CDC and federal and state organizations. These policies and algorithms were followed during the patient's care in the ED.  Some ED evaluations and interventions may be delayed as a result of limited staffing during the pandemic.       Alfred Levins, [place], MD 03/01/22 [phone]

## 2022-03-01 NOTE — Assessment & Plan Note (Signed)
--  We will continue Restoril, BuSpar and Seroquel

## 2022-03-01 NOTE — Assessment & Plan Note (Signed)
Followed by psychiatry.  On seroquel.  Dr Felecia Shelling is tapering her remeron.  Had gained weight.  Started her on temazepam.  Also has buspar.  Discussed dosing of buspar.  Felt '15mg'$  too much.  Took 7.'5mg'$  this am.  Discussed taking 7.'5mg'$  tid.  Follow.

## 2022-03-01 NOTE — Assessment & Plan Note (Signed)
-   The patient be admitted to a medical bed. - We will continue antibiotic therapy with IV Rocephin. - We will follow urine culture and sensitivity. - This could be contributing to her worsening pain.

## 2022-03-01 NOTE — H&P (Signed)
[place]   PATIENT NAME: Carly [name]    MR#:  [id number]  DATE OF BIRTH:  1950-12-03  DATE OF ADMISSION:  03/01/2022  PRIMARY CARE PHYSICIAN: Einar Pheasant, MD   Patient is coming from: Home  REQUESTING/REFERRING PHYSICIAN: Rudene Re, MD  CHIEF COMPLAINT:   Chief Complaint  Patient presents with   Leg Pain    HISTORY OF PRESENT ILLNESS:  Carly [name] is a 71 y.o. Caucasian female with medical history significant for anxiety, depression, GERD, migraine, multiple sclerosis and obstructive sleep apnea, who presented to the ER with acute onset of bilateral lower extremity pain that is acute on chronic with associated lymphedema.  That has been getting significantly worse since yesterday.  She has been taking baclofen without much help.  She recently had some change in her medications by her PCP and was concerned about the reaction from those medications.  She denied any rashes or pruritus.  She has been advised to wean off her Remeron and temazepam and was restarted on BuSpar.  No chest pain or palpitations.  No nausea or vomiting or abdominal pain.  No dysuria, oliguria or hematuria or flank pain.  No new paresthesias or focal muscle weakness.  ED Course: When she came to the ER, BP was 160/107 with otherwise normal vital signs.  Labs revealed unremarkable CMP.  BNP was 14.1 and CK 79.  CBC within normal.  UA showed positive nitrite with 0-5 WBCs and few bacteria.  Urine culture was sent.  Imaging: None  The patient was given 50 mcg of IV fentanyl, 4 mg of IV morphine sulfate, 2.5 mg IV Valium, 5 mg of p.o. oxycodone, 1 L bolus of IV lactated Ringer, 1 g of p.o. Tylenol and 10 mg of p.o. baclofen as well as 1 g of IV Rocephin.  She will be admitted to a medical observation bed for further evaluation and management. PAST MEDICAL HISTORY:   Past Medical History:  Diagnosis Date   Allergy    Anxiety    Aspiration pneumonia (HCC)    Depression     Frequent headaches    H/O   GERD (gastroesophageal reflux disease)    History of chicken pox    History of colon polyps    Hx of migraines    Multiple sclerosis ([place]) 2011   OSA (obstructive sleep apnea)    PONV (postoperative nausea and vomiting)     PAST SURGICAL HISTORY:   Past Surgical History:  Procedure Laterality Date   BACK SURGERY     CHOLECYSTECTOMY     COLONOSCOPY WITH PROPOFOL N/A 05/18/2017   Procedure: COLONOSCOPY WITH PROPOFOL;  Surgeon: [name], MD;  Location: [facility] ENDOSCOPY;  Service: Endoscopy;  Laterality: N/A;   FOOT SURGERY  2015   GALLBLADDER SURGERY  2008   HARDWARE REMOVAL Left 02/14/2016   Procedure: LEFT FOOT REMOVAL DEEP IMPLANT;  Surgeon: [name], MD;  Location: [place];  Service: Orthopedics;  Laterality: Left;   HERNIA REPAIR     Inguinal Hernia Repair   SPINE SURGERY  2014    SOCIAL HISTORY:   Social History   Tobacco Use   Smoking status: Never   Smokeless tobacco: Never  Substance Use Topics   Alcohol use: No    Alcohol/week: 0.0 standard drinks    FAMILY HISTORY:   Family History  Problem Relation Age of Onset   Arthritis Mother    Hypertension Mother    Macular degeneration Mother  Hypertension Father    Hyperlipidemia Father    Heart disease Maternal Grandfather    Diabetes Maternal Grandfather    Kidney disease Paternal Grandmother     DRUG ALLERGIES:   Allergies  Allergen Reactions   Ibuprofen Swelling and Other (See Comments)   Sulfamethoxazole-Trimethoprim Itching   Penicillin G Rash   Asa [Aspirin] Swelling   Buspirone Other (See Comments)    Headaches     Levofloxacin Other (See Comments)    "weakness"   Lexapro [Escitalopram Oxalate] Other (See Comments)    Weakness    Pollen Extract Hives   Ceftin [Cefuroxime Axetil] Rash   Penicillins Rash   Sulfa Antibiotics Rash    REVIEW OF SYSTEMS:   ROS As per history of present illness. All pertinent systems were reviewed  above. Constitutional, HEENT, cardiovascular, respiratory, GI, GU, musculoskeletal, neuro, psychiatric, endocrine, integumentary and hematologic systems were reviewed and are otherwise negative/unremarkable except for positive findings mentioned above in the HPI.   MEDICATIONS AT HOME:   Prior to Admission medications   Medication Sig Start Date End Date Taking? Authorizing Provider  acetaminophen (TYLENOL) 325 MG tablet Take by mouth.    [provider]  acetaminophen (TYLENOL) 500 MG tablet Take 1 tablet (500 mg total) by mouth every 4 (four) hours as needed. 12/13/20   Barb Merino, MD  albuterol (VENTOLIN HFA) 108 (90 Base) MCG/ACT inhaler Inhale 2 puffs into the lungs every 6 (six) hours as needed. 10/18/21   Einar Pheasant, MD  amLODipine (NORVASC) 5 MG tablet TAKE 1 TABLET BY MOUTH DAILY. 09/27/21   Einar Pheasant, MD  azelastine (ASTELIN) 0.1 % nasal spray SMARTSIG:1-2 Spray(s) Both Nares Twice Daily 01/27/22   [provider]  Baclofen 5 MG TABS Take 1 tablet by mouth 3 (three) times daily. 02/28/21   [provider]  busPIRone (BUSPAR) 15 MG tablet Take 1 tablet (15 mg total) by mouth 2 (two) times daily. 02/26/22   Sater, Nanine Means, MD  Cholecalciferol (D3-1000 PO) Take 2,000 Units by mouth daily.    [provider]  clopidogrel (PLAVIX) 75 MG tablet TAKE 1 TABLET (75 MG) BY MOUTH EVERY DAY 11/26/21   Dutch Quint B, FNP  Coenzyme Q10 (CO Q10) 100 MG CAPS Take 1 capsule by mouth daily.    [provider]  cyanocobalamin (,VITAMIN B-12,) 1000 MCG/ML injection Inject 1 mL (1,000 mcg total) into the muscle every 30 (thirty) days. 12/26/21   Raulkar, Clide Deutscher, MD  dalfampridine 10 MG TB12 Take 1 tablet (10 mg total) by mouth every 12 (twelve) hours. 02/26/22   Sater, Nanine Means, MD  docusate sodium (COLACE) 100 MG capsule Take 100 mg by mouth 2 (two) times daily.    [provider]  DULoxetine (CYMBALTA) 60 MG capsule TAKE 1 CAPSULE EVERY  DAY 02/12/22   Einar Pheasant, MD  DULoxetine (CYMBALTA) 60 MG capsule Take 1 capsule (60 mg total) by mouth daily. 02/12/22   Raulkar, Clide Deutscher, MD  fluticasone (FLONASE) 50 MCG/ACT nasal spray Place 1 spray into both nostrils daily. 06/07/19   Jodelle Green, FNP  fluticasone (FLONASE) 50 MCG/ACT nasal spray Place into the nose. 02/22/20   [provider]  losartan (COZAAR) 50 MG tablet TAKE 1 TABLET BY MOUTH DAILY 11/29/21   Einar Pheasant, MD  magnesium oxide (MAG-OX) 400 MG tablet TAKE 1 TABLET BY MOUTH DAILY 04/08/21   Einar Pheasant, MD  METAMUCIL FIBER PO Take by mouth 2 (two) times daily.    [provider]  mirtazapine (REMERON SOL-TAB) 30 MG disintegrating tablet Take 1 tablet (30 mg total) by mouth at bedtime. 02/12/22   Raulkar, Clide Deutscher, MD  nystatin cream (MYCOSTATIN) Apply 1 application topically 2 (two) times daily. 10/04/21   McLean-Scocuzza, Nino Glow, MD  omeprazole (PRILOSEC) 20 MG capsule Take 20 mg by mouth 2 (two) times daily before a meal.    [provider]  ondansetron (ZOFRAN) 4 MG tablet Take 1 tablet (4 mg total) by mouth every 8 (eight) hours as needed. 12/19/21   Nance Pear, MD  rosuvastatin (CRESTOR) 5 MG tablet Take 1 tablet (5 mg total) by mouth daily. 04/01/21   Einar Pheasant, MD  saccharomyces boulardii (FLORASTOR) 250 MG capsule Take 1 capsule (250 mg total) by mouth 2 (two) times daily. 12/27/20   Love, Ivan Anchors, PA-C  SEROQUEL 25 MG tablet Take 1 tablet (25 mg total) by mouth 4 (four) times daily as needed. 10/22/21   Raulkar, Clide Deutscher, MD  sodium chloride (OCEAN) 0.65 % nasal spray Place 1-2 sprays into the nose as needed.    [provider]  temazepam (RESTORIL) 15 MG capsule Take 1 capsule (15 mg total) by mouth at bedtime as needed for sleep. 02/26/22   Sater, Nanine Means, MD      VITAL SIGNS:  Blood pressure (!) 142/110, pulse 87, temperature 97.7 F (36.5 C), temperature source Oral, resp. rate 20, height '5\' 3"'$  (1.6 m),  weight 83 kg, SpO2 92 %.  PHYSICAL EXAMINATION:  Physical Exam  GENERAL:  71 y.o.-year-old Caucasian female patient lying in the bed with no acute distress.  EYES: Pupils equal, round, reactive to light and accommodation. No scleral icterus. Extraocular muscles intact.  HEENT: Head atraumatic, normocephalic. Oropharynx and nasopharynx clear.  NECK:  Supple, no jugular venous distention. No thyroid enlargement, no tenderness.  LUNGS: Normal breath sounds bilaterally, no wheezing, rales,rhonchi or crepitation. No use of accessory muscles of respiration.  CARDIOVASCULAR: Regular rate and rhythm, S1, S2 normal. No murmurs, rubs, or gallops.  ABDOMEN: Soft, nondistended, nontender. Bowel sounds present. No organomegaly or mass.  EXTREMITIES: No pedal edema, cyanosis, or clubbing.  NEUROLOGIC: Cranial nerves II through XII are intact. Muscle strength 5/5 in all extremities. Sensation intact. Gait not checked.  PSYCHIATRIC: The patient is alert and oriented x 3.  Normal affect and good eye contact. SKIN: No obvious rash, lesion, or ulcer.   LABORATORY PANEL:   CBC Recent Labs  Lab 02/28/22 2047  WBC 10.3  HGB 13.1  HCT 40.1  PLT 304   ------------------------------------------------------------------------------------------------------------------  Chemistries  Recent Labs  Lab 02/28/22 2047  NA 140  K 4.2  CL 105  CO2 24  GLUCOSE 121*  BUN 12  CREATININE 0.56  CALCIUM 10.1  AST 25  ALT 26  ALKPHOS 91  BILITOT 0.4   ------------------------------------------------------------------------------------------------------------------  Cardiac Enzymes No results for input(s): TROPONINI in the last 168 hours. ------------------------------------------------------------------------------------------------------------------  RADIOLOGY:  No results found.    IMPRESSION AND PLAN:  Assessment and Plan: * Acute lower UTI - The patient be admitted to a medical bed. - We will  continue antibiotic therapy with IV Rocephin. - We will follow urine culture and sensitivity. - This could be contributing to her worsening pain.  Multiple sclerosis ([place]) - We will continue her baclofen and dalfampridine.  OSA on CPAP - We will continue her on CPAP nightly.  Dyslipidemia - We will continue statin therapy.  Anxiety and depression --We will continue Restoril, BuSpar and Seroquel  Hypertension - We will continue her Norvasc.   DVT prophylaxis: Lovenox.  Advanced Care Planning:  Code Status: full code.  Family Communication:  The plan of care was discussed in details with the patient (and family). I answered all questions. The patient agreed to proceed with the above mentioned plan. Further management will depend upon hospital course. Disposition Plan: Back to previous home environment Consults called: none.  All the records are reviewed and case discussed with ED provider.  Status is: Observation  I certify that at the time of admission, it is my clinical judgment that the patient will require  hospital care extending less than 2 midnights.                            Dispo: The patient is from: Home              Anticipated d/c is to: Home              Patient currently is not medically stable to d/c.              Difficult to place patient: No  Christel Mormon M.D on 03/01/2022 at 6:10 AM  [facility]   From 7 PM-7 AM, contact night-coverage [email or web address]  CC: Primary care physician; Einar Pheasant, MD

## 2022-03-01 NOTE — Assessment & Plan Note (Signed)
She is doing better tolerating cpap.  Continue.

## 2022-03-01 NOTE — Assessment & Plan Note (Signed)
Followed by psychiatry.  Medication changes per Dr Felecia Shelling as outlined.

## 2022-03-01 NOTE — Assessment & Plan Note (Signed)
Per caretaker, no increased swelling.  Follow.

## 2022-03-01 NOTE — Assessment & Plan Note (Signed)
Low-carb diet and exercise.  Follow metabolic panel and A1c. 

## 2022-03-01 NOTE — Assessment & Plan Note (Signed)
Bowels doing better.  Follow.   

## 2022-03-01 NOTE — Assessment & Plan Note (Signed)
-   We will continue her on CPAP nightly.

## 2022-03-01 NOTE — Assessment & Plan Note (Signed)
Just saw Dr Felecia Shelling.  Stable.

## 2022-03-01 NOTE — Progress Notes (Signed)
PHARMACIST - PHYSICIAN ORDER COMMUNICATION  CONCERNING: P&T Medication Policy on Herbal Medications  DESCRIPTION:  This patient's order for:  Co Q10 CAPS has been noted.  This product(s) is classified as an "herbal" or natural product. Due to a lack of definitive safety studies or FDA approval, nonstandard manufacturing practices, plus the potential risk of unknown drug-drug interactions while on inpatient medications, the Pharmacy and Therapeutics Committee does not permit the use of "herbal" or natural products of this type within [facility].   ACTION TAKEN: The pharmacy department is unable to verify this order at this time.  Please reevaluate patient's clinical condition at discharge and address if the herbal or natural product(s) should be resumed at that time.    Renda Rolls, PharmD, [facility] 03/01/2022 5:45 AM

## 2022-03-02 ENCOUNTER — Inpatient Hospital Stay: Payer: Medicare Other

## 2022-03-02 ENCOUNTER — Encounter: Payer: Self-pay | Admitting: Internal Medicine

## 2022-03-02 DIAGNOSIS — M79605 Pain in left leg: Secondary | ICD-10-CM | POA: Diagnosis not present

## 2022-03-02 DIAGNOSIS — G35 Multiple sclerosis: Secondary | ICD-10-CM | POA: Diagnosis not present

## 2022-03-02 DIAGNOSIS — M79604 Pain in right leg: Secondary | ICD-10-CM

## 2022-03-02 DIAGNOSIS — N390 Urinary tract infection, site not specified: Secondary | ICD-10-CM | POA: Diagnosis not present

## 2022-03-02 LAB — CBC
HCT: 36.7 % (ref 36.0–46.0)
Hemoglobin: 11.4 g/dL — ABNORMAL LOW (ref 12.0–15.0)
MCH: 26.5 pg (ref 26.0–34.0)
MCHC: 31.1 g/dL (ref 30.0–36.0)
MCV: 85.3 fL (ref 80.0–100.0)
Platelets: 247 10*3/uL (ref 150–400)
RBC: 4.3 MIL/uL (ref 3.87–5.11)
RDW: 15.9 % — ABNORMAL HIGH (ref 11.5–15.5)
WBC: 8.5 10*3/uL (ref 4.0–10.5)
nRBC: 0 % (ref 0.0–0.2)

## 2022-03-02 LAB — BASIC METABOLIC PANEL
Anion gap: 7 (ref 5–15)
BUN: 13 mg/dL (ref 8–23)
CO2: 28 mmol/L (ref 22–32)
Calcium: 9 mg/dL (ref 8.9–10.3)
Chloride: 104 mmol/L (ref 98–111)
Creatinine, Ser: 0.68 mg/dL (ref 0.44–1.00)
GFR, Estimated: >60 mL/min (ref >60)
Glucose, Bld: 112 mg/dL — ABNORMAL HIGH (ref 70–99)
Potassium: 3.7 mmol/L (ref 3.5–5.1)
Sodium: 139 mmol/L (ref 135–145)

## 2022-03-02 MED ORDER — FOSFOMYCIN TROMETHAMINE 3 G PO PACK
3.0000 g | PACK | Freq: Once | ORAL | Status: AC
  Administered 2022-03-02: 3 g via ORAL
  Filled 2022-03-02: qty 3

## 2022-03-02 MED ORDER — GABAPENTIN 100 MG PO CAPS: 100.0000 mg | ORAL_CAPSULE | Freq: Two times a day (BID) | ORAL | 0 refills | Status: DC

## 2022-03-02 NOTE — Discharge Summary (Incomplete Revision)
Physician Discharge Summary   Patient: Carly [name] MRN: [id number] DOB: 04-17-1951  Admit date:     03/01/2022  Discharge date: 03/02/22  Discharge Physician: Sharen Hones   PCP: Einar Pheasant, MD   Recommendations at discharge:   Follow-up with PCP in 1 week.  Discharge Diagnoses: Principal Problem:   Acute lower UTI Active Problems:   Multiple sclerosis (HCC)   Hypertension   Anxiety and depression   Obstructive sleep apnea   Dyslipidemia   OSA on CPAP   Bilateral leg pain  Resolved Problems:   * No resolved hospital problems. *  Hospital Course: Carly [name] is a 71 y.o. Caucasian female with medical history significant for anxiety, depression, GERD, migraine, multiple sclerosis and obstructive sleep apnea, who presented to the ER with acute onset of bilateral lower extremity pain that is acute on chronic with associated lymphedema Patient was treated with pain medicine and IV fluids.  She was also placed on antibiotics for possible UTI.  Assessment and Plan: Bilateral leg pain. Chronic bilateral lower extremity lymphedema. She has been complaining of leg pain at rest, worse with exertion. Venous duplex ultrasound did not show any DVT.  Arterial ultrasound, I tried Neurontin 100 mg twice a day, pain seems to be better.  Most likely patient had a peripheral neuropathy.  Continue Neurontin and follow-up with PCP in 1 week.   Gram-negative rods UTI. Received Rocephin, will give 1 dose of fosfomycin.  MS. Follow-up with PCP and neurology as outpatient.   Sleep apnea. Continue CPAP nightly.  Essential hypertension. Resume home treatment.      Consultants: None Procedures performed: None  Disposition: Home health Diet recommendation:  Discharge Diet Orders (From admission, onward)     Start     Ordered   03/02/22 0000  Diet - low sodium heart healthy        03/02/22 1037           Cardiac diet DISCHARGE MEDICATION: Allergies as of  03/02/2022       Reactions   Ibuprofen Swelling, Other (See Comments)   Sulfamethoxazole-trimethoprim Itching   Penicillin G Rash   Asa [aspirin] Swelling   Buspirone Other (See Comments)   Headaches   Levofloxacin Other (See Comments)   "weakness"   Lexapro [escitalopram Oxalate] Other (See Comments)   Weakness   Pollen Extract Hives   Ceftin [cefuroxime Axetil] Rash   Penicillins Rash   Sulfa Antibiotics Rash        Medication List     TAKE these medications    acetaminophen 500 MG tablet Commonly known as: TYLENOL Take 1 tablet (500 mg total) by mouth every 4 (four) hours as needed.   albuterol 108 (90 Base) MCG/ACT inhaler Commonly known as: VENTOLIN HFA Inhale 2 puffs into the lungs every 6 (six) hours as needed.   amLODipine 5 MG tablet Commonly known as: NORVASC TAKE 1 TABLET BY MOUTH DAILY.   azelastine 0.1 % nasal spray Commonly known as: ASTELIN SMARTSIG:1-2 Spray(s) Both Nares Twice Daily   Baclofen 5 MG Tabs Take 1 tablet by mouth 3 (three) times daily.   busPIRone 15 MG tablet Commonly known as: BUSPAR Take 1 tablet (15 mg total) by mouth 2 (two) times daily.   clopidogrel 75 MG tablet Commonly known as: PLAVIX TAKE 1 TABLET (75 MG) BY MOUTH EVERY DAY   Co Q10 100 MG Caps Take 1 capsule by mouth daily.   cyanocobalamin 1000 MCG/ML injection Commonly known as: (VITAMIN B-12) Inject  1 mL (1,000 mcg total) into the muscle every 30 (thirty) days.   D3-1000 PO Take 2,000 Units by mouth daily.   dalfampridine 10 MG Tb12 Take 1 tablet (10 mg total) by mouth every 12 (twelve) hours.   docusate sodium 100 MG capsule Commonly known as: COLACE Take 100 mg by mouth 2 (two) times daily.   DULoxetine 60 MG capsule Commonly known as: Cymbalta Take 1 capsule (60 mg total) by mouth daily.   fluticasone 50 MCG/ACT nasal spray Commonly known as: FLONASE Place into the nose.   gabapentin 100 MG capsule Commonly known as: NEURONTIN Take 1 capsule  (100 mg total) by mouth 2 (two) times daily.   losartan 50 MG tablet Commonly known as: COZAAR TAKE 1 TABLET BY MOUTH DAILY   magnesium oxide 400 MG tablet Commonly known as: MAG-OX TAKE 1 TABLET BY MOUTH DAILY   METAMUCIL FIBER PO Take by mouth 2 (two) times daily.   mirtazapine 30 MG disintegrating tablet Commonly known as: REMERON SOL-TAB Take 1 tablet (30 mg total) by mouth at bedtime.   nystatin cream Commonly known as: MYCOSTATIN Apply 1 application topically 2 (two) times daily.   omeprazole 20 MG capsule Commonly known as: PRILOSEC Take 20 mg by mouth 2 (two) times daily before a meal.   ondansetron 4 MG tablet Commonly known as: Zofran Take 1 tablet (4 mg total) by mouth every 8 (eight) hours as needed.   rosuvastatin 5 MG tablet Commonly known as: Crestor Take 1 tablet (5 mg total) by mouth daily.   saccharomyces boulardii 250 MG capsule Commonly known as: FLORASTOR Take 1 capsule (250 mg total) by mouth 2 (two) times daily.   SEROquel 25 MG tablet Generic drug: QUEtiapine Take 1 tablet (25 mg total) by mouth 4 (four) times daily as needed.   sodium chloride 0.65 % nasal spray Commonly known as: OCEAN Place 1-2 sprays into the nose as needed.   temazepam 15 MG capsule Commonly known as: RESTORIL Take 1 capsule (15 mg total) by mouth at bedtime as needed for sleep.        Follow-up Information     Einar Pheasant, MD Follow up in 1 week(s).   Specialty: Internal Medicine Contact information: [address] [place] [address] [phone]                Discharge Exam: Carly [name] Weights   02/28/22 2046  Weight: 83 kg   General exam: Appears calm and comfortable  Respiratory system: Clear to auscultation. Respiratory effort normal. Cardiovascular system: S1 & S2 heard, RRR. No JVD, murmurs, rubs, gallops or clicks. No pedal edema. Gastrointestinal system: Abdomen is nondistended, soft and nontender. No  organomegaly or masses felt. Normal bowel sounds heard. Central nervous system: Alert and oriented. No focal neurological deficits. Extremities: Symmetric 5 x 5 power. Skin: No rashes, lesions or ulcers Psychiatry: Judgement and insight appear normal. Mood & affect appropriate.    Condition at discharge: good  The results of significant diagnostics from this hospitalization (including imaging, microbiology, ancillary and laboratory) are listed below for reference.   Imaging Studies: US Venous Img Lower Bilateral (DVT)  Result Date: 03/01/2022 CLINICAL DATA:  Pain and edema EXAM: BILATERAL LOWER EXTREMITY VENOUS DOPPLER ULTRASOUND TECHNIQUE: Gray-scale sonography with compression, as well as color and duplex ultrasound, were performed to evaluate the deep venous system(s) from the level of the common femoral vein through the popliteal and proximal calf veins. COMPARISON:  None available FINDINGS: VENOUS Normal compressibility  of the common femoral, superficial femoral, and popliteal veins, as well as the visualized calf veins. Visualized portions of profunda femoral vein and great saphenous vein unremarkable. No filling defects to suggest DVT on grayscale or color Doppler imaging. Doppler waveforms show normal direction of venous flow, normal respiratory plasticity and response to augmentation. OTHER None. Limitations: none IMPRESSION: No lower extremity DVT Electronically Signed   By: Miachel Roux M.D.   On: 03/01/2022 17:23   SLEEP STUDY DOCUMENTS  Result Date: 02/21/2022 Ordered by an unspecified provider.  SLEEP STUDY DOCUMENTS  Result Date: 02/21/2022 Ordered by an unspecified provider.   Microbiology: Results for orders placed or performed during the hospital encounter of 03/01/22  Urine Culture     Status: Abnormal (Preliminary result)   Collection Time: 03/01/22  2:50 AM   Specimen: Urine, Catheterized  Result Value Ref Range Status   Specimen Description   Final    URINE,  CATHETERIZED Performed at [facility], [address] [place], [place] [address]    Special Requests   Final    NONE Performed at [facility], [address] [place], [place] [address]    Culture (A)  Final    >=100,000 COLONIES/mL ESCHERICHIA COLI CULTURE REINCUBATED FOR BETTER GROWTH SUSCEPTIBILITIES TO FOLLOW Performed at [facility], [place] [address], [place],  [address]    Report Status PENDING  Incomplete    Labs: CBC: Recent Labs  Lab 02/28/22 2047 03/02/22 0550  WBC 10.3 8.5  HGB 13.1 11.4*  HCT 40.1 36.7  MCV 83.5 85.3  PLT 304 678   Basic Metabolic Panel: Recent Labs  Lab 02/28/22 2047 03/02/22 0550  NA 140 139  K 4.2 3.7  CL 105 104  CO2 24 28  GLUCOSE 121* 112*  BUN 12 13  CREATININE 0.56 0.68  CALCIUM 10.1 9.0   Liver Function Tests: Recent Labs  Lab 02/28/22 2047  AST 25  ALT 26  ALKPHOS 91  BILITOT 0.4  PROT 7.3  ALBUMIN 4.4   CBG: No results for input(s): GLUCAP in the last 168 hours.  Discharge time spent: greater than 30 minutes.  Signed: Sharen Hones, MD [facility] 03/02/2022

## 2022-03-02 NOTE — Plan of Care (Signed)

## 2022-03-02 NOTE — Discharge Summary (Addendum)
Physician Discharge Summary   Patient: Carly [name] MRN: [id number] DOB: 03/27/1951  Admit date:     03/01/2022  Discharge date: 03/02/22  Discharge Physician: Sharen Hones   PCP: Einar Pheasant, MD   Recommendations at discharge:   Follow-up with PCP in 1 week.  Discharge Diagnoses: Principal Problem:   Acute lower UTI Active Problems:   Multiple sclerosis (HCC)   Hypertension   Anxiety and depression   Obstructive sleep apnea   Dyslipidemia   OSA on CPAP   Bilateral leg pain  Resolved Problems:   * No resolved hospital problems. *  Hospital Course: Carly [name] is a 71 y.o. Caucasian female with medical history significant for anxiety, depression, GERD, migraine, multiple sclerosis and obstructive sleep apnea, who presented to the ER with acute onset of bilateral lower extremity pain that is acute on chronic with associated lymphedema Patient was treated with pain medicine and IV fluids.  She was also placed on antibiotics for possible UTI.  Assessment and Plan: Bilateral leg pain. Chronic bilateral lower extremity lymphedema. She has been complaining of leg pain at rest, worse with exertion. Venous duplex ultrasound did not show any DVT.  Arterial ultrasound did not show significant obstruction I tried Neurontin 100 mg twice a day, pain seems to be better.  Most likely patient had a peripheral neuropathy.  Continue Neurontin and follow-up with PCP in 1 week.   Gram-negative rods UTI. Received Rocephin, will give 1 dose of fosfomycin.  MS. Follow-up with PCP and neurology as outpatient.   Sleep apnea. Continue CPAP nightly.  Essential hypertension. Resume home treatment.      Consultants: None Procedures performed: None  Disposition: Home health Diet recommendation:  Discharge Diet Orders (From admission, onward)     Start     Ordered   03/02/22 0000  Diet - low sodium heart healthy        03/02/22 1037           Cardiac  diet DISCHARGE MEDICATION: Allergies as of 03/02/2022       Reactions   Ibuprofen Swelling, Other (See Comments)   Sulfamethoxazole-trimethoprim Itching   Penicillin G Rash   Asa [aspirin] Swelling   Buspirone Other (See Comments)   Headaches   Levofloxacin Other (See Comments)   "weakness"   Lexapro [escitalopram Oxalate] Other (See Comments)   Weakness   Pollen Extract Hives   Ceftin [cefuroxime Axetil] Rash   Penicillins Rash   Sulfa Antibiotics Rash        Medication List     TAKE these medications    acetaminophen 500 MG tablet Commonly known as: TYLENOL Take 1 tablet (500 mg total) by mouth every 4 (four) hours as needed.   albuterol 108 (90 Base) MCG/ACT inhaler Commonly known as: VENTOLIN HFA Inhale 2 puffs into the lungs every 6 (six) hours as needed.   amLODipine 5 MG tablet Commonly known as: NORVASC TAKE 1 TABLET BY MOUTH DAILY.   azelastine 0.1 % nasal spray Commonly known as: ASTELIN SMARTSIG:1-2 Spray(s) Both Nares Twice Daily   Baclofen 5 MG Tabs Take 1 tablet by mouth 3 (three) times daily.   busPIRone 15 MG tablet Commonly known as: BUSPAR Take 1 tablet (15 mg total) by mouth 2 (two) times daily.   clopidogrel 75 MG tablet Commonly known as: PLAVIX TAKE 1 TABLET (75 MG) BY MOUTH EVERY DAY   Co Q10 100 MG Caps Take 1 capsule by mouth daily.   cyanocobalamin 1000 MCG/ML injection Commonly  known as: (VITAMIN B-12) Inject 1 mL (1,000 mcg total) into the muscle every 30 (thirty) days.   D3-1000 PO Take 2,000 Units by mouth daily.   dalfampridine 10 MG Tb12 Take 1 tablet (10 mg total) by mouth every 12 (twelve) hours.   docusate sodium 100 MG capsule Commonly known as: COLACE Take 100 mg by mouth 2 (two) times daily.   DULoxetine 60 MG capsule Commonly known as: Cymbalta Take 1 capsule (60 mg total) by mouth daily.   fluticasone 50 MCG/ACT nasal spray Commonly known as: FLONASE Place into the nose.   gabapentin 100 MG  capsule Commonly known as: NEURONTIN Take 1 capsule (100 mg total) by mouth 2 (two) times daily.   losartan 50 MG tablet Commonly known as: COZAAR TAKE 1 TABLET BY MOUTH DAILY   magnesium oxide 400 MG tablet Commonly known as: MAG-OX TAKE 1 TABLET BY MOUTH DAILY   METAMUCIL FIBER PO Take by mouth 2 (two) times daily.   mirtazapine 30 MG disintegrating tablet Commonly known as: REMERON SOL-TAB Take 1 tablet (30 mg total) by mouth at bedtime.   nystatin cream Commonly known as: MYCOSTATIN Apply 1 application topically 2 (two) times daily.   omeprazole 20 MG capsule Commonly known as: PRILOSEC Take 20 mg by mouth 2 (two) times daily before a meal.   ondansetron 4 MG tablet Commonly known as: Zofran Take 1 tablet (4 mg total) by mouth every 8 (eight) hours as needed.   rosuvastatin 5 MG tablet Commonly known as: Crestor Take 1 tablet (5 mg total) by mouth daily.   saccharomyces boulardii 250 MG capsule Commonly known as: FLORASTOR Take 1 capsule (250 mg total) by mouth 2 (two) times daily.   SEROquel 25 MG tablet Generic drug: QUEtiapine Take 1 tablet (25 mg total) by mouth 4 (four) times daily as needed.   sodium chloride 0.65 % nasal spray Commonly known as: OCEAN Place 1-2 sprays into the nose as needed.   temazepam 15 MG capsule Commonly known as: RESTORIL Take 1 capsule (15 mg total) by mouth at bedtime as needed for sleep.        Follow-up Information     Einar Pheasant, MD Follow up in 1 week(s).   Specialty: Internal Medicine Contact information: [address] [place] [address] [phone]                Discharge Exam: Danley Danker Weights   02/28/22 2046  Weight: 83 kg   General exam: Appears calm and comfortable  Respiratory system: Clear to auscultation. Respiratory effort normal. Cardiovascular system: S1 & S2 heard, RRR. No JVD, murmurs, rubs, gallops or clicks. No pedal edema. Gastrointestinal system:  Abdomen is nondistended, soft and nontender. No organomegaly or masses felt. Normal bowel sounds heard. Central nervous system: Alert and oriented. No focal neurological deficits. Extremities: Symmetric 5 x 5 power. Skin: No rashes, lesions or ulcers Psychiatry: Judgement and insight appear normal. Mood & affect appropriate.    Condition at discharge: good  The results of significant diagnostics from this hospitalization (including imaging, microbiology, ancillary and laboratory) are listed below for reference.   Imaging Studies: US Venous Img Lower Bilateral (DVT)  Result Date: 03/01/2022 CLINICAL DATA:  Pain and edema EXAM: BILATERAL LOWER EXTREMITY VENOUS DOPPLER ULTRASOUND TECHNIQUE: Gray-scale sonography with compression, as well as color and duplex ultrasound, were performed to evaluate the deep venous system(s) from the level of the common femoral vein through the popliteal and proximal calf veins. COMPARISON:  None  available FINDINGS: VENOUS Normal compressibility of the common femoral, superficial femoral, and popliteal veins, as well as the visualized calf veins. Visualized portions of profunda femoral vein and great saphenous vein unremarkable. No filling defects to suggest DVT on grayscale or color Doppler imaging. Doppler waveforms show normal direction of venous flow, normal respiratory plasticity and response to augmentation. OTHER None. Limitations: none IMPRESSION: No lower extremity DVT Electronically Signed   By: Miachel Roux M.D.   On: 03/01/2022 17:23   SLEEP STUDY DOCUMENTS  Result Date: 02/21/2022 Ordered by an unspecified provider.  SLEEP STUDY DOCUMENTS  Result Date: 02/21/2022 Ordered by an unspecified provider.   Microbiology: Results for orders placed or performed during the hospital encounter of 03/01/22  Urine Culture     Status: Abnormal (Preliminary result)   Collection Time: 03/01/22  2:50 AM   Specimen: Urine, Catheterized  Result Value Ref Range Status    Specimen Description   Final    URINE, CATHETERIZED Performed at [facility], [address] [place], [place] [address]    Special Requests   Final    NONE Performed at [facility], [address] [place], [place] [address]    Culture (A)  Final    >=100,000 COLONIES/mL ESCHERICHIA COLI CULTURE REINCUBATED FOR BETTER GROWTH SUSCEPTIBILITIES TO FOLLOW Performed at [facility], [place] [address] [place], [place] [address]    Report Status PENDING  Incomplete    Labs: CBC: Recent Labs  Lab 02/28/22 2047 03/02/22 0550  WBC 10.3 8.5  HGB 13.1 11.4*  HCT 40.1 36.7  MCV 83.5 85.3  PLT 304 263   Basic Metabolic Panel: Recent Labs  Lab 02/28/22 2047 03/02/22 0550  NA 140 139  K 4.2 3.7  CL 105 104  CO2 24 28  GLUCOSE 121* 112*  BUN 12 13  CREATININE 0.56 0.68  CALCIUM 10.1 9.0   Liver Function Tests: Recent Labs  Lab 02/28/22 2047  AST 25  ALT 26  ALKPHOS 91  BILITOT 0.4  PROT 7.3  ALBUMIN 4.4   CBG: No results for input(s): GLUCAP in the last 168 hours.  Discharge time spent: greater than 30 minutes.  Signed: Sharen Hones, MD [facility] 03/02/2022

## 2022-03-02 NOTE — Progress Notes (Signed)
Blood pressure 134/70, pulse 84, temperature 97.7 F (36.5 C), temperature source Oral, resp. rate 18, height '5\' 3"'$  (1.6 m), weight 83 kg, SpO2 99 %. IV removed site c/d/I all meds given to pt before discharging from hospital, discussed d/c packet with family verbalized understanding d/c packet with all belongings scripts sent to RX of pt choice.

## 2022-03-03 ENCOUNTER — Telehealth: Payer: Self-pay

## 2022-03-03 ENCOUNTER — Telehealth: Payer: Self-pay | Admitting: Internal Medicine

## 2022-03-03 DIAGNOSIS — R2681 Unsteadiness on feet: Secondary | ICD-10-CM | POA: Diagnosis not present

## 2022-03-03 DIAGNOSIS — R262 Difficulty in walking, not elsewhere classified: Secondary | ICD-10-CM | POA: Diagnosis not present

## 2022-03-03 DIAGNOSIS — R279 Unspecified lack of coordination: Secondary | ICD-10-CM | POA: Diagnosis not present

## 2022-03-03 DIAGNOSIS — I69398 Other sequelae of cerebral infarction: Secondary | ICD-10-CM | POA: Diagnosis not present

## 2022-03-03 DIAGNOSIS — G35 Multiple sclerosis: Secondary | ICD-10-CM | POA: Diagnosis not present

## 2022-03-03 DIAGNOSIS — M6281 Muscle weakness (generalized): Secondary | ICD-10-CM | POA: Diagnosis not present

## 2022-03-03 LAB — URINE CULTURE: Culture: >=100000 — AB

## 2022-03-03 NOTE — Telephone Encounter (Signed)
Transition Care Management Follow-up Telephone Call Date of discharge and from where: 03/02/22 [facility] How have you been since you were released from the hospital? Denies pain and increased depression/anxiety. Taking medications as directed.  Any questions or concerns? No  Items Reviewed: Did the pt receive and understand the discharge instructions provided? Yes  Medications obtained and verified? Yes  Any new allergies since your discharge? No  Dietary orders reviewed? Yes Do you have support at home? No   Home Care and Equipment/Supplies: Were home health services ordered? no  Functional Questionnaire: (I = Independent and D = Dependent) ADLs: Husband assist  Bathing/Dressing- Staff  Meal Prep- Staff  Eating- I  Maintaining continence- I  Transferring/Ambulation- Walker  Managing Meds- Husband assist  Follow up appointments reviewed:  PCP Hospital f/u appt confirmed? Yes  Scheduled to see PCP on 03/14/22 @ 9:30 via mychart. [facility] f/u appt confirmed? Plans to schedule with Neurology.   Are transportation arrangements needed? No  If their condition worsens, is the pt aware to call PCP or go to the Emergency Dept.? Yes Was the patient provided with contact information for the PCP's office or ED? Yes Was to pt encouraged to call back with questions or concerns? Yes

## 2022-03-03 NOTE — Telephone Encounter (Signed)
Pt called in stating that she went to the ED on 02/28/2022... 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 Pt would like for Dr. Nicki Reaper to look at her lab work because some of her labs came back low... Pt requesting callback... Pt is schedule for follow up on 03/14/2022 at 9:30am... Pt 03/07/2022 was rescheduled for 03/14/2022.Marland KitchenMarland Kitchen

## 2022-03-04 ENCOUNTER — Telehealth: Payer: Self-pay

## 2022-03-04 ENCOUNTER — Other Ambulatory Visit: Payer: Self-pay | Admitting: Internal Medicine

## 2022-03-04 DIAGNOSIS — E7800 Pure hypercholesterolemia, unspecified: Secondary | ICD-10-CM

## 2022-03-04 DIAGNOSIS — D649 Anemia, unspecified: Secondary | ICD-10-CM

## 2022-03-04 DIAGNOSIS — I10 Essential (primary) hypertension: Secondary | ICD-10-CM

## 2022-03-04 DIAGNOSIS — R739 Hyperglycemia, unspecified: Secondary | ICD-10-CM

## 2022-03-04 NOTE — Progress Notes (Signed)
Orders placed for labs

## 2022-03-04 NOTE — Telephone Encounter (Signed)
I reviewed her labs.  Her kidney function and liver function tests are wnl.  Her hgb on recheck in the hospital was slightly decreased.  I would like to recheck her cbc, iron studies and B12.  Can also schedule with non fasting lab - she is due lipid panel and tsh.  If agreeable, schedule her for fasting lab appt within the next week.  I will order labs.

## 2022-03-05 DIAGNOSIS — M6281 Muscle weakness (generalized): Secondary | ICD-10-CM | POA: Diagnosis not present

## 2022-03-05 DIAGNOSIS — R262 Difficulty in walking, not elsewhere classified: Secondary | ICD-10-CM | POA: Diagnosis not present

## 2022-03-05 DIAGNOSIS — G35 Multiple sclerosis: Secondary | ICD-10-CM | POA: Diagnosis not present

## 2022-03-05 DIAGNOSIS — R279 Unspecified lack of coordination: Secondary | ICD-10-CM | POA: Diagnosis not present

## 2022-03-05 DIAGNOSIS — R2681 Unsteadiness on feet: Secondary | ICD-10-CM | POA: Diagnosis not present

## 2022-03-05 DIAGNOSIS — I69398 Other sequelae of cerebral infarction: Secondary | ICD-10-CM | POA: Diagnosis not present

## 2022-03-05 NOTE — Telephone Encounter (Signed)
Lab orders changed to [facility].

## 2022-03-05 NOTE — Telephone Encounter (Signed)
error 

## 2022-03-06 ENCOUNTER — Encounter: Payer: Self-pay | Admitting: Internal Medicine

## 2022-03-06 ENCOUNTER — Telehealth: Payer: Self-pay | Admitting: Neurology

## 2022-03-06 ENCOUNTER — Telehealth (INDEPENDENT_AMBULATORY_CARE_PROVIDER_SITE_OTHER): Payer: Medicare Other | Admitting: Internal Medicine

## 2022-03-06 DIAGNOSIS — Z9989 Dependence on other enabling machines and devices: Secondary | ICD-10-CM

## 2022-03-06 DIAGNOSIS — I10 Essential (primary) hypertension: Secondary | ICD-10-CM

## 2022-03-06 DIAGNOSIS — Z8673 Personal history of transient ischemic attack (TIA), and cerebral infarction without residual deficits: Secondary | ICD-10-CM | POA: Diagnosis not present

## 2022-03-06 DIAGNOSIS — F339 Major depressive disorder, recurrent, unspecified: Secondary | ICD-10-CM

## 2022-03-06 DIAGNOSIS — N390 Urinary tract infection, site not specified: Secondary | ICD-10-CM

## 2022-03-06 DIAGNOSIS — E7800 Pure hypercholesterolemia, unspecified: Secondary | ICD-10-CM | POA: Diagnosis not present

## 2022-03-06 DIAGNOSIS — F32A Depression, unspecified: Secondary | ICD-10-CM | POA: Diagnosis not present

## 2022-03-06 DIAGNOSIS — G35 Multiple sclerosis: Secondary | ICD-10-CM

## 2022-03-06 DIAGNOSIS — M79605 Pain in left leg: Secondary | ICD-10-CM | POA: Diagnosis not present

## 2022-03-06 DIAGNOSIS — M79604 Pain in right leg: Secondary | ICD-10-CM

## 2022-03-06 DIAGNOSIS — G4733 Obstructive sleep apnea (adult) (pediatric): Secondary | ICD-10-CM

## 2022-03-06 DIAGNOSIS — F419 Anxiety disorder, unspecified: Secondary | ICD-10-CM | POA: Diagnosis not present

## 2022-03-06 NOTE — Telephone Encounter (Signed)
I received a copy of her titration sleep study 12/10/2021.  A previous home sleep study 08/08/2021 showed AHI equals 33.  O2 nadir was 75%.  She was placed on AutoPap and had AHI equals 11, according to the technician notes.  In order to try to optimize her OSA therapy, she was brought in for the BiPAP titration on 12/10/2021.  Multiple pressures were trialed but she did no better on pressures up to IPAP of 19 with a 4 to 8 cm IPAP/EPAP spread.  Therefore, an optimal pressure was not found.

## 2022-03-06 NOTE — Progress Notes (Signed)
Patient ID: Carly [name], female   DOB: 1950/12/24, 71 y.o.   MRN: [id number]   Virtual Visit via video Note  All issues noted in this document were discussed and addressed.  No physical exam was performed (except for noted visual exam findings with Video Visits).   I connected with Carly [name] by a video enabled telemedicine application and verified that I am speaking with the correct person using two identifiers. Location patient: home Location provider: work Persons participating in the virtual visit: patient, provider  Tje limitations, risks, security and privacy concerns of performing an evaluation and management service by video and the availability of in person appointments have been discussed.  It has also been discussed with the patient that there may be a patient responsible charge related to this service. The patient expressed understanding and agreed to proceed.   Reason for visit: hospital follow up.   HPI: Admitted 03/01/22 - 03/02/22 - after presenting with bilateral lower extremity pain. Was given IVFs and IV pain medication.  Venous duplex - negative for DVT.  Arterial ultrasound did not show any significant obstruction.  Was started on gabapentin.  Feels has helped.   Was also diagnosed with UTI.  Treated with rocephin and then fosfomycin.  States her leg pain is better.  Discussed her medications. She had questions about buspar - recently started by Dr Carly [name].  Denies any chest pain.  Breathing is stable.  No increased cough or congestion.  Eating.  No vomiting.  Bowels moving.  No urinary issues.     ROS: See pertinent positives and negatives per HPI.  Past Medical History:  Diagnosis Date   Allergy    Anxiety    Aspiration pneumonia (HCC)    Depression    Frequent headaches    H/O   GERD (gastroesophageal reflux disease)    History of chicken pox    History of colon polyps    Hx of migraines    Multiple sclerosis ([place]) 2011   OSA (obstructive sleep  apnea)    PONV (postoperative nausea and vomiting)     Past Surgical History:  Procedure Laterality Date   BACK SURGERY     CHOLECYSTECTOMY     COLONOSCOPY WITH PROPOFOL N/A 05/18/2017   Procedure: COLONOSCOPY WITH PROPOFOL;  Surgeon: [name], MD;  Location: [facility] ENDOSCOPY;  Service: Endoscopy;  Laterality: N/A;   FOOT SURGERY  2015   GALLBLADDER SURGERY  2008   HARDWARE REMOVAL Left 02/14/2016   Procedure: LEFT FOOT REMOVAL DEEP IMPLANT;  Surgeon: [name], MD;  Location: [place];  Service: Orthopedics;  Laterality: Left;   HERNIA REPAIR     Inguinal Hernia Repair   SPINE SURGERY  2014    Family History  Problem Relation Age of Onset   Arthritis Mother    Hypertension Mother    Macular degeneration Mother    Hypertension Father    Hyperlipidemia Father    Heart disease Maternal Grandfather    Diabetes Maternal Grandfather    Kidney disease Paternal Grandmother     SOCIAL HX: reviewed    Current Outpatient Medications:    acetaminophen (TYLENOL) 500 MG tablet, Take 1 tablet (500 mg total) by mouth every 4 (four) hours as needed., Disp: 30 tablet, Rfl: 0   albuterol (VENTOLIN HFA) 108 (90 Base) MCG/ACT inhaler, Inhale 2 puffs into the lungs every 6 (six) hours as needed., Disp: 18 g, Rfl: 0   amLODipine (NORVASC) 5 MG tablet, TAKE 1 TABLET  BY MOUTH DAILY., Disp: 90 tablet, Rfl: 1   azelastine (ASTELIN) 0.1 % nasal spray, SMARTSIG:1-2 Spray(s) Both Nares Twice Daily, Disp: , Rfl:    Baclofen 5 MG TABS, Take 1 tablet by mouth 3 (three) times daily., Disp: , Rfl:    busPIRone (BUSPAR) 15 MG tablet, Take 1 tablet (15 mg total) by mouth 2 (two) times daily., Disp: 60 tablet, Rfl: 5   Cholecalciferol (D3-1000 PO), Take 2,000 Units by mouth daily., Disp: , Rfl:    clopidogrel (PLAVIX) 75 MG tablet, TAKE 1 TABLET (75 MG) BY MOUTH EVERY DAY, Disp: 90 tablet, Rfl: 1   Coenzyme Q10 (CO Q10) 100 MG CAPS, Take 1 capsule by mouth daily., Disp: , Rfl:     cyanocobalamin (,VITAMIN B-12,) 1000 MCG/ML injection, Inject 1 mL (1,000 mcg total) into the muscle every 30 (thirty) days., Disp: 1 mL, Rfl: 3   dalfampridine 10 MG TB12, Take 1 tablet (10 mg total) by mouth every 12 (twelve) hours., Disp: 60 tablet, Rfl: 11   docusate sodium (COLACE) 100 MG capsule, Take 100 mg by mouth 2 (two) times daily., Disp: , Rfl:    DULoxetine (CYMBALTA) 60 MG capsule, Take 1 capsule (60 mg total) by mouth daily., Disp: 90 capsule, Rfl: 3   fluticasone (FLONASE) 50 MCG/ACT nasal spray, Place into the nose., Disp: , Rfl:    gabapentin (NEURONTIN) 100 MG capsule, Take 1 capsule (100 mg total) by mouth 2 (two) times daily., Disp: 60 capsule, Rfl: 0   losartan (COZAAR) 50 MG tablet, TAKE 1 TABLET BY MOUTH DAILY, Disp: 90 tablet, Rfl: 1   magnesium oxide (MAG-OX) 400 MG tablet, TAKE 1 TABLET BY MOUTH DAILY, Disp: 30 tablet, Rfl: 5   METAMUCIL FIBER PO, Take by mouth 2 (two) times daily., Disp: , Rfl:    mirtazapine (REMERON SOL-TAB) 30 MG disintegrating tablet, Take 1 tablet (30 mg total) by mouth at bedtime., Disp: 90 tablet, Rfl: 3   nystatin cream (MYCOSTATIN), Apply 1 application topically 2 (two) times daily., Disp: 30 g, Rfl: 11   omeprazole (PRILOSEC) 20 MG capsule, Take 20 mg by mouth 2 (two) times daily before a meal., Disp: , Rfl:    ondansetron (ZOFRAN) 4 MG tablet, Take 1 tablet (4 mg total) by mouth every 8 (eight) hours as needed., Disp: 20 tablet, Rfl: 0   rosuvastatin (CRESTOR) 5 MG tablet, Take 1 tablet (5 mg total) by mouth daily., Disp: 90 tablet, Rfl: 1   saccharomyces boulardii (FLORASTOR) 250 MG capsule, Take 1 capsule (250 mg total) by mouth 2 (two) times daily., Disp: 60 capsule, Rfl: 0   SEROQUEL 25 MG tablet, Take 1 tablet (25 mg total) by mouth 4 (four) times daily as needed., Disp: 120 tablet, Rfl: 3   sodium chloride (OCEAN) 0.65 % nasal spray, Place 1-2 sprays into the nose as needed., Disp: , Rfl:    temazepam (RESTORIL) 15 MG capsule, Take 1  capsule (15 mg total) by mouth at bedtime as needed for sleep., Disp: 30 capsule, Rfl: 5 No current facility-administered medications for this visit.  Facility-Administered Medications Ordered in Other Visits:    0.9 %  sodium chloride infusion, , Intravenous, Continuous, Corcoran, Melissa C, MD, Last Rate: 10 mL/hr at 04/25/16 0855, New Bag at 04/25/16 0855   acetaminophen (TYLENOL) tablet 650 mg, 650 mg, Oral, Once, Corcoran, Melissa C, MD  EXAM:  GENERAL: alert, oriented, appears well and in no acute distress  HEENT: atraumatic, conjunttiva clear, no obvious abnormalities on inspection of  external nose and ears  NECK: normal movements of the head and neck  LUNGS: on inspection no signs of respiratory distress, breathing rate appears normal, no obvious gross SOB, gasping or wheezing  CV: no obvious cyanosis  PSYCH/NEURO: pleasant and cooperative, no obvious depression or anxiety, speech and thought processing grossly intact  ASSESSMENT AND PLAN:  Discussed the following assessment and plan:  Problem List Items Addressed This Visit     Acute lower UTI    Found in hospital.  Treated with rocephin and fosfomycin.  No urinary symptoms now.        Anxiety and depression    Discussed.  Increased stress related to her medical issues.  Questions answered.  Discussed her recent hospitalization.  Discussed her medications.  Schedule one week f/u at pts request. Discussed buspar dosing.        Bilateral leg pain    Admitted with leg pain as outlined.  Given IV pain medication and IVFs.  Discussed her lower extremity ultrasound results and arterial test results - negative.  Started on gabapentin.  Helping.  Continue.  Follow.        Depression, recurrent ([place])    Followed by psychiatry.  On seroquel.  Dr Carly [name] is tapering her remeron.  Had gained weight.  Started her on temazepam.  Also has buspar.  Discussed dosing of buspar.  Felt '15mg'$  too much.         History of CVA  (cerebrovascular accident)    Continue risk factor modification.  Has been tolerating statin. Follow leg pain. Continue plavix.  Discussed and she understands risk of stopping plavix for procedures.   Follow.          Hypercholesterolemia    Continue crestor.  Low cholesterol diet and exercise.  Follow lipid panel and liver function tests.        Hypertension    Continue amlodipine.  Follow pressures.  Recent kidney function 03/02/22 wnl.        Multiple sclerosis ([place])    Just saw Dr Carly [name].  Stable.         OSA on CPAP    She has been doing better tolerating cpap.  Follow.         Return in about 1 week (around 03/13/2022) for needs f/u appt next week - virtual visit .   I discussed the assessment and treatment plan with the patient. The patient was provided an opportunity to ask questions and all were answered. The patient agreed with the plan and demonstrated an understanding of the instructions.   The patient was advised to call back or seek an in-person evaluation if the symptoms worsen or if the condition fails to improve as anticipated.    Einar Pheasant, MD

## 2022-03-07 ENCOUNTER — Telehealth: Payer: BC Managed Care – PPO | Admitting: Internal Medicine

## 2022-03-07 DIAGNOSIS — M6281 Muscle weakness (generalized): Secondary | ICD-10-CM | POA: Diagnosis not present

## 2022-03-07 DIAGNOSIS — R2681 Unsteadiness on feet: Secondary | ICD-10-CM | POA: Diagnosis not present

## 2022-03-07 DIAGNOSIS — R262 Difficulty in walking, not elsewhere classified: Secondary | ICD-10-CM | POA: Diagnosis not present

## 2022-03-07 DIAGNOSIS — G35 Multiple sclerosis: Secondary | ICD-10-CM | POA: Diagnosis not present

## 2022-03-07 DIAGNOSIS — R279 Unspecified lack of coordination: Secondary | ICD-10-CM | POA: Diagnosis not present

## 2022-03-07 DIAGNOSIS — I69398 Other sequelae of cerebral infarction: Secondary | ICD-10-CM | POA: Diagnosis not present

## 2022-03-08 LAB — CBC WITH DIFFERENTIAL/PLATELET
Basophils Absolute: 0.1 10*3/uL (ref 0.0–0.2)
Basos: 1 %
EOS (ABSOLUTE): 0.2 10*3/uL (ref 0.0–0.4)
Eos: 2 %
Hematocrit: 38.2 % (ref 34.0–46.6)
Hemoglobin: 12.6 g/dL (ref 11.1–15.9)
Immature Grans (Abs): 0 10*3/uL (ref 0.0–0.1)
Immature Granulocytes: 0 %
Lymphocytes Absolute: 2.1 10*3/uL (ref 0.7–3.1)
Lymphs: 28 %
MCH: 26.9 pg (ref 26.6–33.0)
MCHC: 33 g/dL (ref 31.5–35.7)
MCV: 81 fL (ref 79–97)
Monocytes Absolute: 0.4 10*3/uL (ref 0.1–0.9)
Monocytes: 6 %
Neutrophils Absolute: 4.6 10*3/uL (ref 1.4–7.0)
Neutrophils: 63 %
Platelets: 318 10*3/uL (ref 150–450)
RBC: 4.69 x10E6/uL (ref 3.77–5.28)
RDW: 14.9 % (ref 11.7–15.4)
WBC: 7.3 10*3/uL (ref 3.4–10.8)

## 2022-03-08 LAB — IRON,TIBC AND FERRITIN PANEL
Ferritin: 31 ng/mL (ref 15–150)
Iron Saturation: 13 % — ABNORMAL LOW (ref 15–55)
Iron: 48 ug/dL (ref 27–139)
Total Iron Binding Capacity: 377 ug/dL (ref 250–450)
UIBC: 329 ug/dL (ref 118–369)

## 2022-03-08 LAB — LIPID PANEL
Chol/HDL Ratio: 2.2 ratio (ref 0.0–4.4)
Cholesterol, Total: 154 mg/dL (ref 100–199)
HDL: 69 mg/dL (ref >39)
LDL Chol Calc (NIH): 58 mg/dL (ref 0–99)
Triglycerides: 160 mg/dL — ABNORMAL HIGH (ref 0–149)
VLDL Cholesterol Cal: 27 mg/dL (ref 5–40)

## 2022-03-08 LAB — HEMOGLOBIN A1C
Est. average glucose Bld gHb Est-mCnc: 120 mg/dL
Hgb A1c MFr Bld: 5.8 % — ABNORMAL HIGH (ref 4.8–5.6)

## 2022-03-08 LAB — TSH: TSH: 2.05 u[IU]/mL (ref 0.450–4.500)

## 2022-03-08 LAB — VITAMIN B12: Vitamin B-12: 682 pg/mL (ref 232–1245)

## 2022-03-10 DIAGNOSIS — I69398 Other sequelae of cerebral infarction: Secondary | ICD-10-CM | POA: Diagnosis not present

## 2022-03-10 DIAGNOSIS — R2681 Unsteadiness on feet: Secondary | ICD-10-CM | POA: Diagnosis not present

## 2022-03-10 DIAGNOSIS — M6281 Muscle weakness (generalized): Secondary | ICD-10-CM | POA: Diagnosis not present

## 2022-03-10 DIAGNOSIS — R262 Difficulty in walking, not elsewhere classified: Secondary | ICD-10-CM | POA: Diagnosis not present

## 2022-03-10 DIAGNOSIS — G35 Multiple sclerosis: Secondary | ICD-10-CM | POA: Diagnosis not present

## 2022-03-10 DIAGNOSIS — R279 Unspecified lack of coordination: Secondary | ICD-10-CM | POA: Diagnosis not present

## 2022-03-11 ENCOUNTER — Encounter: Payer: Self-pay | Admitting: Internal Medicine

## 2022-03-11 NOTE — Assessment & Plan Note (Signed)
Followed by psychiatry.  On seroquel.  Dr Felecia Shelling is tapering her remeron.  Had gained weight.  Started her on temazepam.  Also has buspar.  Discussed dosing of buspar.  Felt '15mg'$  too much.

## 2022-03-11 NOTE — Assessment & Plan Note (Addendum)
Continue amlodipine.  Follow pressures.  Recent kidney function 03/02/22 wnl.  

## 2022-03-11 NOTE — Assessment & Plan Note (Signed)
She has been doing better tolerating cpap.  Follow.

## 2022-03-11 NOTE — Assessment & Plan Note (Signed)
Found in hospital.  Treated with rocephin and fosfomycin.  No urinary symptoms now.

## 2022-03-11 NOTE — Assessment & Plan Note (Signed)
Continue risk factor modification.  Has been tolerating statin. Follow leg pain. Continue plavix.  Discussed and she understands risk of stopping plavix for procedures.   Follow.

## 2022-03-11 NOTE — Assessment & Plan Note (Signed)
Discussed.  Increased stress related to her medical issues.  Questions answered.  Discussed her recent hospitalization.  Discussed her medications.  Schedule one week f/u at pts request. Discussed buspar dosing.

## 2022-03-11 NOTE — Assessment & Plan Note (Signed)
Just saw Dr Felecia Shelling.  Stable.

## 2022-03-11 NOTE — Assessment & Plan Note (Signed)
Admitted with leg pain as outlined.  Given IV pain medication and IVFs.  Discussed her lower extremity ultrasound results and arterial test results - negative.  Started on gabapentin.  Helping.  Continue.  Follow.

## 2022-03-11 NOTE — Assessment & Plan Note (Signed)
Continue crestor.  Low cholesterol diet and exercise. Follow lipid panel and liver function tests.   

## 2022-03-12 ENCOUNTER — Other Ambulatory Visit: Payer: Self-pay | Admitting: Internal Medicine

## 2022-03-12 ENCOUNTER — Telehealth: Payer: Self-pay | Admitting: Neurology

## 2022-03-12 DIAGNOSIS — M5413 Radiculopathy, cervicothoracic region: Secondary | ICD-10-CM | POA: Diagnosis not present

## 2022-03-12 DIAGNOSIS — M542 Cervicalgia: Secondary | ICD-10-CM | POA: Diagnosis not present

## 2022-03-12 DIAGNOSIS — M79602 Pain in left arm: Secondary | ICD-10-CM | POA: Diagnosis not present

## 2022-03-12 DIAGNOSIS — M79601 Pain in right arm: Secondary | ICD-10-CM | POA: Diagnosis not present

## 2022-03-12 DIAGNOSIS — M7918 Myalgia, other site: Secondary | ICD-10-CM | POA: Diagnosis not present

## 2022-03-12 DIAGNOSIS — M9900 Segmental and somatic dysfunction of head region: Secondary | ICD-10-CM | POA: Diagnosis not present

## 2022-03-12 DIAGNOSIS — R519 Headache, unspecified: Secondary | ICD-10-CM | POA: Diagnosis not present

## 2022-03-12 DIAGNOSIS — M5412 Radiculopathy, cervical region: Secondary | ICD-10-CM | POA: Diagnosis not present

## 2022-03-12 DIAGNOSIS — M9901 Segmental and somatic dysfunction of cervical region: Secondary | ICD-10-CM | POA: Diagnosis not present

## 2022-03-12 NOTE — Telephone Encounter (Addendum)
Reviewed pt chart. Last saw [name] 02/26/22. Off DMT for MS.   Looks like she went to [facility] regional for bilateral lower extremity pain. Admitted from 03/01/22-03/02/22. [place] negative for DVT. Found to have UTI and given antibiotics. They started her on gabapentin '100mg'$  po BID which she reported help. Felt she may have had episode of peripheral neuropathy per note. Per d/c notes, states: "Continue Neurontin and follow-up with PCP in 1 week." I do not see where she has had PCP appt yet for hospital f/u. Per med list, appears she is currently taking baclofen '5mg'$  po TID.  LVM returning husband call.

## 2022-03-12 NOTE — Telephone Encounter (Signed)
Pt's husband would like a call from RN to discuss if pt should continue taking the gabapentin (NEURONTIN) 100 MG capsule, and if there should be an increase in pt's Baclofen 5 MG TABS. Please call.

## 2022-03-13 DIAGNOSIS — R279 Unspecified lack of coordination: Secondary | ICD-10-CM | POA: Diagnosis not present

## 2022-03-13 DIAGNOSIS — M6281 Muscle weakness (generalized): Secondary | ICD-10-CM | POA: Diagnosis not present

## 2022-03-13 DIAGNOSIS — Z9181 History of falling: Secondary | ICD-10-CM | POA: Diagnosis not present

## 2022-03-13 DIAGNOSIS — R262 Difficulty in walking, not elsewhere classified: Secondary | ICD-10-CM | POA: Diagnosis not present

## 2022-03-13 DIAGNOSIS — R2681 Unsteadiness on feet: Secondary | ICD-10-CM | POA: Diagnosis not present

## 2022-03-13 DIAGNOSIS — I69398 Other sequelae of cerebral infarction: Secondary | ICD-10-CM | POA: Diagnosis not present

## 2022-03-13 DIAGNOSIS — G35 Multiple sclerosis: Secondary | ICD-10-CM | POA: Diagnosis not present

## 2022-03-13 NOTE — Telephone Encounter (Signed)
LVM for pt at [phone] to call office (previously LVM on [phone]/husbands #).   Tried [phone]. Phone # not in service.

## 2022-03-13 NOTE — Telephone Encounter (Addendum)
Called and spoke w/ husband/wife. They requested I speak with caregiver, [name]. She reports pt UTI is cleared up. However, since being home, she has noticed that around 1pm, pt experiences more stiffness in legs/tenderness. Confirmed she is taking baclofen '5mg'$  po TID and gabapentin '100mg'$  po BID. Ever since going on gabapentin, she has been experiencing hot flashes though. She does take Tylenol prn as well.  Wondering if dose of baclofen can be increased and if she should continue gabapentin? Feels gabapentin has helped, pt worried to go off in fear that she will have another episode of peripheral neuropathy that caused her to be admitted previously to the hospital. Aware I will send to MD to review and will call back with his recommendations.

## 2022-03-13 NOTE — Telephone Encounter (Signed)
LVM asking for husband to call office back. Advised we close at 5pm and closed tomorrow.  Per [name], ok to refill gabapentin '100mg'$  po BID #60, 11 refills as well. Just wanting to confirm which pharmacy they want refills to go to first

## 2022-03-14 ENCOUNTER — Telehealth: Payer: Medicare Other | Admitting: Internal Medicine

## 2022-03-14 ENCOUNTER — Telehealth (INDEPENDENT_AMBULATORY_CARE_PROVIDER_SITE_OTHER): Payer: Medicare Other | Admitting: Internal Medicine

## 2022-03-14 DIAGNOSIS — R739 Hyperglycemia, unspecified: Secondary | ICD-10-CM | POA: Diagnosis not present

## 2022-03-14 DIAGNOSIS — Z8673 Personal history of transient ischemic attack (TIA), and cerebral infarction without residual deficits: Secondary | ICD-10-CM | POA: Diagnosis not present

## 2022-03-14 DIAGNOSIS — E7800 Pure hypercholesterolemia, unspecified: Secondary | ICD-10-CM

## 2022-03-14 DIAGNOSIS — I10 Essential (primary) hypertension: Secondary | ICD-10-CM | POA: Diagnosis not present

## 2022-03-14 DIAGNOSIS — K635 Polyp of colon: Secondary | ICD-10-CM

## 2022-03-14 DIAGNOSIS — G4733 Obstructive sleep apnea (adult) (pediatric): Secondary | ICD-10-CM

## 2022-03-14 DIAGNOSIS — F32A Depression, unspecified: Secondary | ICD-10-CM

## 2022-03-14 DIAGNOSIS — Z9989 Dependence on other enabling machines and devices: Secondary | ICD-10-CM | POA: Diagnosis not present

## 2022-03-14 DIAGNOSIS — F339 Major depressive disorder, recurrent, unspecified: Secondary | ICD-10-CM | POA: Diagnosis not present

## 2022-03-14 DIAGNOSIS — M79605 Pain in left leg: Secondary | ICD-10-CM

## 2022-03-14 DIAGNOSIS — F419 Anxiety disorder, unspecified: Secondary | ICD-10-CM | POA: Diagnosis not present

## 2022-03-14 DIAGNOSIS — M79604 Pain in right leg: Secondary | ICD-10-CM

## 2022-03-14 DIAGNOSIS — G35 Multiple sclerosis: Secondary | ICD-10-CM

## 2022-03-14 NOTE — Progress Notes (Signed)
Patient ID: Carly [name], female   DOB: 11-08-1950, 71 y.o.   MRN: [id number]   Virtual Visit via video Note   All issues noted in this document were discussed and addressed.  No physical exam was performed (except for noted visual exam findings with Video Visits).   I connected with [name] by a video enabled telemedicine application and verified that I am speaking with the correct person using two identifiers. Location patient: home Location provider: work  Persons participating in the virtual visit: patient, provider and pts caretaker [name]).   The limitations, risks, security and privacy concerns of performing an evaluation and management service by video and the availability of in person appointments have been discussed.  It ha also been discussed with the patient that there may be a patient responsible charge related to this service. The patient expressed understanding and agreed to proceed.   Reason for visit: work in appt.   HPI: Recently evaluated and admitted for leg pain.  Multiple medications tried and gabapentin worked best for her.  She has been taking and feels is helping her leg pain.  Is concerned regarding gabapentin causing hot flashes.  Dr Felecia Shelling has adjusted some of her medications as well.  She is off remeron.  He stopped due to her weight gain.  Took the last one three days ago.  Taking buspar.  On baclofen.  Is eating.  No nausea or vomiting.  Bowels have been doing ok.    ROS: See pertinent positives and negatives per HPI.  Past Medical History:  Diagnosis Date   Allergy    Anxiety    Aspiration pneumonia (HCC)    Depression    Frequent headaches    H/O   GERD (gastroesophageal reflux disease)    History of chicken pox    History of colon polyps    Hx of migraines    Multiple sclerosis ([place]) 2011   OSA (obstructive sleep apnea)    PONV (postoperative nausea and vomiting)     Past Surgical History:  Procedure Laterality Date   BACK  SURGERY     CHOLECYSTECTOMY     COLONOSCOPY WITH PROPOFOL N/A 05/18/2017   Procedure: COLONOSCOPY WITH PROPOFOL;  Surgeon: [name], MD;  Location: [facility] ENDOSCOPY;  Service: Endoscopy;  Laterality: N/A;   FOOT SURGERY  2015   GALLBLADDER SURGERY  2008   HARDWARE REMOVAL Left 02/14/2016   Procedure: LEFT FOOT REMOVAL DEEP IMPLANT;  Surgeon: [name], MD;  Location: [place];  Service: Orthopedics;  Laterality: Left;   HERNIA REPAIR     Inguinal Hernia Repair   SPINE SURGERY  2014    Family History  Problem Relation Age of Onset   Arthritis Mother    Hypertension Mother    Macular degeneration Mother    Hypertension Father    Hyperlipidemia Father    Heart disease Maternal Grandfather    Diabetes Maternal Grandfather    Kidney disease Paternal Grandmother     SOCIAL HX: reviewed.    Current Outpatient Medications:    acetaminophen (TYLENOL) 500 MG tablet, Take 1 tablet (500 mg total) by mouth every 4 (four) hours as needed., Disp: 30 tablet, Rfl: 0   albuterol (VENTOLIN HFA) 108 (90 Base) MCG/ACT inhaler, Inhale 2 puffs into the lungs every 6 (six) hours as needed., Disp: 18 g, Rfl: 0   amLODipine (NORVASC) 5 MG tablet, TAKE 1 TABLET BY MOUTH DAILY., Disp: 90 tablet, Rfl: 1   azelastine (ASTELIN)  0.1 % nasal spray, SMARTSIG:1-2 Spray(s) Both Nares Twice Daily, Disp: , Rfl:    Baclofen 5 MG TABS, Take 1 tablet by mouth 3 (three) times daily., Disp: , Rfl:    busPIRone (BUSPAR) 15 MG tablet, Take 1 tablet (15 mg total) by mouth 2 (two) times daily., Disp: 60 tablet, Rfl: 5   Cholecalciferol (D3-1000 PO), Take 2,000 Units by mouth daily., Disp: , Rfl:    clopidogrel (PLAVIX) 75 MG tablet, TAKE 1 TABLET (75 MG) BY MOUTH EVERY DAY, Disp: 90 tablet, Rfl: 1   Coenzyme Q10 (CO Q10) 100 MG CAPS, Take 1 capsule by mouth daily., Disp: , Rfl:    cyanocobalamin (,VITAMIN B-12,) 1000 MCG/ML injection, Inject 1 mL (1,000 mcg total) into the muscle every 30 (thirty)  days., Disp: 1 mL, Rfl: 3   dalfampridine 10 MG TB12, Take 1 tablet (10 mg total) by mouth every 12 (twelve) hours., Disp: 60 tablet, Rfl: 11   docusate sodium (COLACE) 100 MG capsule, Take 100 mg by mouth 2 (two) times daily., Disp: , Rfl:    DULoxetine (CYMBALTA) 60 MG capsule, Take 1 capsule (60 mg total) by mouth daily., Disp: 90 capsule, Rfl: 3   fluticasone (FLONASE) 50 MCG/ACT nasal spray, Place into the nose., Disp: , Rfl:    gabapentin (NEURONTIN) 100 MG capsule, Take 1 capsule (100 mg total) by mouth 2 (two) times daily., Disp: 60 capsule, Rfl: 0   losartan (COZAAR) 50 MG tablet, TAKE 1 TABLET BY MOUTH DAILY, Disp: 90 tablet, Rfl: 1   magnesium oxide (MAG-OX) 400 MG tablet, TAKE 1 TABLET BY MOUTH DAILY, Disp: 30 tablet, Rfl: 5   METAMUCIL FIBER PO, Take by mouth 2 (two) times daily., Disp: , Rfl:    nystatin cream (MYCOSTATIN), Apply 1 application topically 2 (two) times daily., Disp: 30 g, Rfl: 11   omeprazole (PRILOSEC) 20 MG capsule, Take 20 mg by mouth 2 (two) times daily before a meal., Disp: , Rfl:    ondansetron (ZOFRAN) 4 MG tablet, Take 1 tablet (4 mg total) by mouth every 8 (eight) hours as needed., Disp: 20 tablet, Rfl: 0   rosuvastatin (CRESTOR) 5 MG tablet, TAKE 1 TABLET BY MOUTH EVERY MONDAY, WEDNESDAY AND FRIDAY., Disp: 90 tablet, Rfl: 1   saccharomyces boulardii (FLORASTOR) 250 MG capsule, Take 1 capsule (250 mg total) by mouth 2 (two) times daily., Disp: 60 capsule, Rfl: 0   SEROQUEL 25 MG tablet, Take 1 tablet (25 mg total) by mouth 4 (four) times daily as needed., Disp: 120 tablet, Rfl: 3   sodium chloride (OCEAN) 0.65 % nasal spray, Place 1-2 sprays into the nose as needed., Disp: , Rfl:    temazepam (RESTORIL) 15 MG capsule, Take 1 capsule (15 mg total) by mouth at bedtime as needed for sleep., Disp: 30 capsule, Rfl: 5 No current facility-administered medications for this visit.  Facility-Administered Medications Ordered in Other Visits:    0.9 %  sodium chloride  infusion, , Intravenous, Continuous, Corcoran, Melissa C, MD, Last Rate: 10 mL/hr at 04/25/16 0855, New Bag at 04/25/16 0855   acetaminophen (TYLENOL) tablet 650 mg, 650 mg, Oral, Once, Corcoran, Melissa C, MD  EXAM:  GENERAL: alert, oriented, appears well and in no acute distress  HEENT: atraumatic, conjunttiva clear, no obvious abnormalities on inspection of external nose and ears  NECK: normal movements of the head and neck  LUNGS: on inspection no signs of respiratory distress, breathing rate appears normal, no obvious gross SOB, gasping or wheezing  CV:  no obvious cyanosis  PSYCH/NEURO: pleasant and cooperative, no obvious depression or anxiety, speech and thought processing grossly intact  ASSESSMENT AND PLAN:  Discussed the following assessment and plan:  Problem List Items Addressed This Visit     Anxiety and depression    On buspar.  Medications being adjusted as outlined.  Follow.        Bilateral leg pain    Doing better on gabapentin.  Feels may be causing hot flashes.  Decrease to one q hs.  Follow.        Colon polyp    Discussed colonoscopy.  Last colonoscopy 05/2017.  Recommended f/u in 05/2020.  S/p CVA so colonoscopy was postponed.   Has been > 1 year now.  Is on plavix.  See neurology note for recommendation regarding plavix.  Discussed possible risk of being off plavix.  Will need to resume as soon as possible post colonoscopy (per GI).  She plans to discuss with GI regarding previous aspiration with last colonoscopy.        Depression, recurrent ([place])    Followed by psychiatry.  On seroquel.  Dr Felecia Shelling had he taper remeron.  Started her on temazepam.  Also has buspar.  Taking buspar.        History of CVA (cerebrovascular accident)    Continue risk factor modification.  Has been tolerating statin. Follow leg pain. Continue plavix.  Discussed and she understands risk of stopping plavix for procedures.   Follow.          Hypercholesterolemia     Continue crestor.  Low cholesterol diet and exercise.  Follow lipid panel and liver function tests.        Hyperglycemia    Low-carb diet and exercise.  Follow metabolic panel and R9X.       Hypertension    Continue amlodipine.  Follow pressures.  Recent kidney function 03/02/22 wnl.        Multiple sclerosis ([place])    Followed by Dr Felecia Shelling.        OSA on CPAP    She has been doing better - wearing cpap.  Follow.         Return if symptoms worsen or fail to improve.   I discussed the assessment and treatment plan with the patient. The patient was provided an opportunity to ask questions and all were answered. The patient agreed with the plan and demonstrated an understanding of the instructions.   The patient was advised to call back or seek an in-person evaluation if the symptoms worsen or if the condition fails to improve as anticipated.   Einar Pheasant, MD

## 2022-03-16 ENCOUNTER — Encounter: Payer: Self-pay | Admitting: Internal Medicine

## 2022-03-16 NOTE — Assessment & Plan Note (Signed)
She has been doing better - wearing cpap.  Follow.

## 2022-03-16 NOTE — Assessment & Plan Note (Signed)
Followed by Dr Sater.    

## 2022-03-16 NOTE — Assessment & Plan Note (Signed)
Continue risk factor modification.  Has been tolerating statin. Follow leg pain. Continue plavix.  Discussed and she understands risk of stopping plavix for procedures.   Follow.

## 2022-03-16 NOTE — Assessment & Plan Note (Signed)
Continue crestor.  Low cholesterol diet and exercise. Follow lipid panel and liver function tests.   

## 2022-03-16 NOTE — Assessment & Plan Note (Signed)
Followed by psychiatry.  On seroquel.  Dr Felecia Shelling had he taper remeron.  Started her on temazepam.  Also has buspar.  Taking buspar.

## 2022-03-16 NOTE — Assessment & Plan Note (Signed)
Low-carb diet and exercise.  Follow metabolic panel and A1c. 

## 2022-03-16 NOTE — Assessment & Plan Note (Signed)
Doing better on gabapentin.  Feels may be causing hot flashes.  Decrease to one q hs.  Follow.

## 2022-03-16 NOTE — Assessment & Plan Note (Signed)
On buspar.  Medications being adjusted as outlined.  Follow.

## 2022-03-16 NOTE — Assessment & Plan Note (Signed)
Discussed colonoscopy.  Last colonoscopy 05/2017.  Recommended f/u in 05/2020.  S/p CVA so colonoscopy was postponed.   Has been > 1 year now.  Is on plavix.  See neurology note for recommendation regarding plavix.  Discussed possible risk of being off plavix.  Will need to resume as soon as possible post colonoscopy (per GI).  She plans to discuss with GI regarding previous aspiration with last colonoscopy.  

## 2022-03-16 NOTE — Assessment & Plan Note (Signed)
Continue amlodipine.  Follow pressures.  Recent kidney function 03/02/22 wnl.  

## 2022-03-17 ENCOUNTER — Other Ambulatory Visit: Payer: Self-pay | Admitting: *Deleted

## 2022-03-17 ENCOUNTER — Encounter: Payer: Medicare Other | Admitting: Physical Medicine and Rehabilitation

## 2022-03-17 DIAGNOSIS — R252 Cramp and spasm: Secondary | ICD-10-CM

## 2022-03-17 DIAGNOSIS — R279 Unspecified lack of coordination: Secondary | ICD-10-CM | POA: Diagnosis not present

## 2022-03-17 DIAGNOSIS — G35 Multiple sclerosis: Secondary | ICD-10-CM | POA: Diagnosis not present

## 2022-03-17 DIAGNOSIS — R2681 Unsteadiness on feet: Secondary | ICD-10-CM | POA: Diagnosis not present

## 2022-03-17 DIAGNOSIS — I69398 Other sequelae of cerebral infarction: Secondary | ICD-10-CM | POA: Diagnosis not present

## 2022-03-17 DIAGNOSIS — R262 Difficulty in walking, not elsewhere classified: Secondary | ICD-10-CM | POA: Diagnosis not present

## 2022-03-17 DIAGNOSIS — M6281 Muscle weakness (generalized): Secondary | ICD-10-CM | POA: Diagnosis not present

## 2022-03-17 MED ORDER — GABAPENTIN 100 MG PO CAPS: 100.0000 mg | ORAL_CAPSULE | Freq: Two times a day (BID) | ORAL | 11 refills | Status: DC

## 2022-03-17 MED ORDER — BACLOFEN 5 MG PO TABS: 1.0000 | ORAL_TABLET | Freq: Four times a day (QID) | ORAL | 5 refills | Status: DC

## 2022-03-17 NOTE — Telephone Encounter (Signed)
Pt said, Bacoflen needs to be updated to 4 times a day. Would like a call from the nurse.

## 2022-03-17 NOTE — Addendum Note (Signed)
Addended by: Wyvonnia Lora on: 03/17/2022 08:01 AM   Modules accepted: Orders

## 2022-03-19 DIAGNOSIS — G35 Multiple sclerosis: Secondary | ICD-10-CM | POA: Diagnosis not present

## 2022-03-19 DIAGNOSIS — I69398 Other sequelae of cerebral infarction: Secondary | ICD-10-CM | POA: Diagnosis not present

## 2022-03-19 DIAGNOSIS — R279 Unspecified lack of coordination: Secondary | ICD-10-CM | POA: Diagnosis not present

## 2022-03-19 DIAGNOSIS — M6281 Muscle weakness (generalized): Secondary | ICD-10-CM | POA: Diagnosis not present

## 2022-03-19 DIAGNOSIS — R2681 Unsteadiness on feet: Secondary | ICD-10-CM | POA: Diagnosis not present

## 2022-03-19 DIAGNOSIS — R262 Difficulty in walking, not elsewhere classified: Secondary | ICD-10-CM | POA: Diagnosis not present

## 2022-03-21 ENCOUNTER — Telehealth: Payer: BC Managed Care – PPO | Admitting: Internal Medicine

## 2022-03-24 DIAGNOSIS — M6281 Muscle weakness (generalized): Secondary | ICD-10-CM | POA: Diagnosis not present

## 2022-03-24 DIAGNOSIS — G35 Multiple sclerosis: Secondary | ICD-10-CM | POA: Diagnosis not present

## 2022-03-24 DIAGNOSIS — R2681 Unsteadiness on feet: Secondary | ICD-10-CM | POA: Diagnosis not present

## 2022-03-24 DIAGNOSIS — R279 Unspecified lack of coordination: Secondary | ICD-10-CM | POA: Diagnosis not present

## 2022-03-24 DIAGNOSIS — I69398 Other sequelae of cerebral infarction: Secondary | ICD-10-CM | POA: Diagnosis not present

## 2022-03-24 DIAGNOSIS — R262 Difficulty in walking, not elsewhere classified: Secondary | ICD-10-CM | POA: Diagnosis not present

## 2022-03-25 ENCOUNTER — Ambulatory Visit (INDEPENDENT_AMBULATORY_CARE_PROVIDER_SITE_OTHER): Payer: BC Managed Care – PPO | Admitting: Neurology

## 2022-03-25 ENCOUNTER — Encounter: Payer: Self-pay | Admitting: Neurology

## 2022-03-25 VITALS — BP 137/80 | HR 102 | Ht 63.0 in | Wt 193.0 lb

## 2022-03-25 DIAGNOSIS — F418 Other specified anxiety disorders: Secondary | ICD-10-CM

## 2022-03-25 DIAGNOSIS — G35 Multiple sclerosis: Secondary | ICD-10-CM

## 2022-03-25 DIAGNOSIS — R269 Unspecified abnormalities of gait and mobility: Secondary | ICD-10-CM | POA: Diagnosis not present

## 2022-03-25 DIAGNOSIS — R252 Cramp and spasm: Secondary | ICD-10-CM

## 2022-03-25 DIAGNOSIS — F419 Anxiety disorder, unspecified: Secondary | ICD-10-CM

## 2022-03-25 DIAGNOSIS — Z9989 Dependence on other enabling machines and devices: Secondary | ICD-10-CM

## 2022-03-25 DIAGNOSIS — G4733 Obstructive sleep apnea (adult) (pediatric): Secondary | ICD-10-CM

## 2022-03-25 MED ORDER — RISPERIDONE 0.5 MG PO TABS: 0.5000 mg | ORAL_TABLET | Freq: Two times a day (BID) | ORAL | 5 refills | Status: DC

## 2022-03-25 NOTE — Progress Notes (Addendum)
[facility]  PATIENT: Carly [name] DOB: 1950-11-10  REFERRING DOCTOR OR PCP: Einar Pheasant, MD SOURCE: Patient, notes from neurology Larence Penning, [facility], [facility] neurology), imaging and lab reports, MRI images personally reviewed  _________________________________   HISTORICAL  CHIEF COMPLAINT:  Chief Complaint  Patient presents with   Follow-up    Rm 2, w husband and caregiver. Pt here to f/u for LE pn. Ambulates in WC. Needing disability placard filled today. Has been taking gabapentin '100mg'$  1x a day, was causing hot flashes.     HISTORY OF PRESENT ILLNESS:  Carly [name] is a 71 y.o. woman. 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  Update 03/25/2022: Her PPMS seems to be mostly stable though she is noting more spasticity pain and continued severe anxiety.   Because anxiety was bad last month, I had her titrate off mirtazapine and start temazepam at bedtime.  We also had her re-try buspirone and continue the duloxetine.   She is also on Seroquel 50 mg at bedtime.   She takes 12.5 Seroquel once or twice during the day.  Sometimes the Seroquel makes her sleepy during the day and other times she tolerates it better.  It does not always help her anxiety during the day.  We tried a lower dose of mirtazapine but she feels worse.   She also had trouble getting off Remeron in the past.   She feels anxiety is worse since going off the mirtazapine though her family/caregiver feel it is about the same.   She continues to report a lot of pain and spasms.     The anxiety is throughout the day and it contributes to her insomnia.  She feels she has a lot going on.  [place] recently [place].  She has recent Covid-19.  CPAP was started recently and she is trying to get used to a new CPAP mask.     She will seeing [name] tomorrow.      Currently, gait is poor and she needs a walker for short distances (< 50 feet) but  wheelchair outside the house.  She has left > right leg weakness and spasticity.   She denies numbness or tingling   Bladder is fine.   She has reduced vision  She was diagnosed with OSA and just started Auto-PAP lin January 2023 with ARES HST 08/08/2021 showed AHI = 44.    She sees Dr. Raul Del for the OSA.   She has excessive daytime sleepiness and feels it may be better on the CPAP.  She has tried a couple different masks and feels the current mask has done best..     She uses CPAP 7 hours a night recently.   She has changed masks and feel current one is a little better.     She has gained weight over the last year.  Of note, she is on both mirtazapine and Seroquel.   MS HISTORY: She was diagnosed with MS in 2012 after presenting with progressive left greater than right leg weakness and reduced gait.  She had an MRI of the brain and was found to have severe white matter changes.  In retrospect, she noted that she had difficulties with her left leg including a limp for many years t before the MRI, much worse in hot weather.     She felt she fluctuated a lot without much progression for many years before she started to progress more continuously.Marland Kitchen  Because of the severe extent of white matter changes, not completely typical for MS alternative diagnoses including CADASIL were considered.  The notch 3 PCR was negative.  CSF showed greater than 5 oligoclonal bands.  Therefore, she was diagnosed with primary progressive MS as her time course was more consistent with that then secondary progressive MS.  She had earlier been placed on Tysabri (Dr. Jacqulynn Cadet) around 2014 for a couple years and then switched to [facility] in 2017.  However she felt that she progressed more well on Ocrevus then not on the medication and it was stopped after 4 courses.  She more recently has been seeing Dr. Cristie Hem and is on once a month IV Solu-Medrol.  She does not think it is helping much.  She had a small stroke 11/28/2020  involving the deep white matter of the left frontal  lobe causing right arm weakness    She did have some improvement.   Imaging: MRI of the cervical spine 12/19/2010 showed foci adjacent to C2 to the left, C2-C3 posteriorly C3-C4 posterolaterally to the left, C5-C6 laterally bilaterally   The MRI was followed up with an MRI of the brain 01/07/2011.  And MRI 03/12/2016 both showing a stable pattern of severe white matter hyperintense signal changes with some involvement of the anterior temporal lobes, external capsules and basal ganglia.  The pattern is fairly symmetric.  She also has moderate cortical atrophy.  MRI of the cervical and thoracic spine 04/02/2018 show a similar extent of plaque in the cervical spine as in 2012 though some of the foci are more apparent.  The thoracic spine shows multiple foci including a large focus laterally to the left adjacent to T8-T9 likely playing a role in her left leg weakness  MRI of the brain 11/28/2020 shows a mostly stable pattern of severe white matter change and a couple foci in the pons and left cerebellar hemisphere.  The MRI also showed a small stroke in the left corona radiata with DWI changes much more consistent with infarction than MS.  Additionally there is atrophy that have progressed mildly compared to 2017.  The MR angiogram 12/09/2020 showed mild stenosis in the PCAs but no major stenosis or occlusion.    REVIEW OF SYSTEMS: Constitutional: No fevers, chills, sweats, or change in appetite.  She has insomnia. Eyes: No visual changes, double vision, eye pain Ear, nose and throat: No hearing loss, ear pain, nasal congestion, sore throat Cardiovascular: No chest pain, palpitations Respiratory:  No shortness of breath at rest or with exertion.   No wheezes GastrointestinaI: No nausea, vomiting, diarrhea, abdominal pain, fecal incontinence Genitourinary:  No dysuria, urinary retention or frequency.  No nocturia. Musculoskeletal:  No neck pain, back  pain Integumentary: No rash, pruritus, skin lesions.  She has ankle edema, left greater than right Neurological: as above Psychiatric: She has severe anxiety. Endocrine: No palpitations, diaphoresis, change in appetite, change in weigh or increased thirst Hematologic/Lymphatic:  No anemia, purpura, petechiae. Allergic/Immunologic: No itchy/runny eyes, nasal congestion, recent allergic reactions, rashes  ALLERGIES: Allergies  Allergen Reactions   Ibuprofen Swelling and Other (See Comments)   Sulfamethoxazole-Trimethoprim Itching   Penicillin G Rash   Asa [Aspirin] Swelling   Buspirone Other (See Comments)    Headaches     Levofloxacin Other (See Comments)    "weakness"   Lexapro [Escitalopram Oxalate] Other (See Comments)    Weakness    Pollen Extract Hives   Ceftin [Cefuroxime Axetil] Rash   Penicillins Rash  Sulfa Antibiotics Rash    HOME MEDICATIONS:  Current Outpatient Medications:    acetaminophen (TYLENOL) 500 MG tablet, Take 1 tablet (500 mg total) by mouth every 4 (four) hours as needed., Disp: 30 tablet, Rfl: 0   albuterol (VENTOLIN HFA) 108 (90 Base) MCG/ACT inhaler, Inhale 2 puffs into the lungs every 6 (six) hours as needed., Disp: 18 g, Rfl: 0   amLODipine (NORVASC) 5 MG tablet, TAKE 1 TABLET BY MOUTH DAILY., Disp: 90 tablet, Rfl: 1   azelastine (ASTELIN) 0.1 % nasal spray, SMARTSIG:1-2 Spray(s) Both Nares Twice Daily, Disp: , Rfl:    Baclofen 5 MG TABS, Take 1 tablet by mouth in the morning, at noon, in the evening, and at bedtime., Disp: 120 tablet, Rfl: 5   busPIRone (BUSPAR) 15 MG tablet, Take 1 tablet (15 mg total) by mouth 2 (two) times daily., Disp: 60 tablet, Rfl: 5   Cholecalciferol (D3-1000 PO), Take 2,000 Units by mouth daily., Disp: , Rfl:    clopidogrel (PLAVIX) 75 MG tablet, TAKE 1 TABLET (75 MG) BY MOUTH EVERY DAY, Disp: 90 tablet, Rfl: 1   Coenzyme Q10 (CO Q10) 100 MG CAPS, Take 1 capsule by mouth daily., Disp: , Rfl:    cyanocobalamin (,VITAMIN  B-12,) 1000 MCG/ML injection, Inject 1 mL (1,000 mcg total) into the muscle every 30 (thirty) days., Disp: 1 mL, Rfl: 3   dalfampridine 10 MG TB12, Take 1 tablet (10 mg total) by mouth every 12 (twelve) hours., Disp: 60 tablet, Rfl: 11   docusate sodium (COLACE) 100 MG capsule, Take 100 mg by mouth 2 (two) times daily., Disp: , Rfl:    DULoxetine (CYMBALTA) 60 MG capsule, Take 1 capsule (60 mg total) by mouth daily., Disp: 90 capsule, Rfl: 3   fluticasone (FLONASE) 50 MCG/ACT nasal spray, Place into the nose., Disp: , Rfl:    gabapentin (NEURONTIN) 100 MG capsule, Take 1 capsule (100 mg total) by mouth 2 (two) times daily. (Patient taking differently: Take 100 mg by mouth daily.), Disp: 60 capsule, Rfl: 11   losartan (COZAAR) 50 MG tablet, TAKE 1 TABLET BY MOUTH DAILY, Disp: 90 tablet, Rfl: 1   magnesium oxide (MAG-OX) 400 MG tablet, TAKE 1 TABLET BY MOUTH DAILY, Disp: 30 tablet, Rfl: 5   METAMUCIL FIBER PO, Take by mouth 2 (two) times daily., Disp: , Rfl:    nystatin cream (MYCOSTATIN), Apply 1 application topically 2 (two) times daily., Disp: 30 g, Rfl: 11   omeprazole (PRILOSEC) 20 MG capsule, Take 20 mg by mouth 2 (two) times daily before a meal., Disp: , Rfl:    ondansetron (ZOFRAN) 4 MG tablet, Take 1 tablet (4 mg total) by mouth every 8 (eight) hours as needed., Disp: 20 tablet, Rfl: 0   risperiDONE (RISPERDAL) 0.5 MG tablet, Take 1 tablet (0.5 mg total) by mouth 2 (two) times daily., Disp: 60 tablet, Rfl: 5   rosuvastatin (CRESTOR) 5 MG tablet, TAKE 1 TABLET BY MOUTH EVERY MONDAY, WEDNESDAY AND FRIDAY., Disp: 90 tablet, Rfl: 1   saccharomyces boulardii (FLORASTOR) 250 MG capsule, Take 1 capsule (250 mg total) by mouth 2 (two) times daily., Disp: 60 capsule, Rfl: 0   sodium chloride (OCEAN) 0.65 % nasal spray, Place 1-2 sprays into the nose as needed., Disp: , Rfl:    temazepam (RESTORIL) 15 MG capsule, Take 1 capsule (15 mg total) by mouth at bedtime as needed for sleep., Disp: 30 capsule,  Rfl: 5 No current facility-administered medications for this visit.  Facility-Administered Medications Ordered in  Other Visits:    0.9 %  sodium chloride infusion, , Intravenous, Continuous, Corcoran, Melissa C, MD, Last Rate: 10 mL/hr at 04/25/16 0855, New Bag at 04/25/16 0855   acetaminophen (TYLENOL) tablet 650 mg, 650 mg, Oral, Once, Corcoran, Drue Second, MD  PAST MEDICAL HISTORY: Past Medical History:  Diagnosis Date   Allergy    Anxiety    Aspiration pneumonia ([place])    Depression    Frequent headaches    H/O   GERD (gastroesophageal reflux disease)    History of chicken pox    History of colon polyps    Hx of migraines    Multiple sclerosis ([place]) 2011   OSA (obstructive sleep apnea)    PONV (postoperative nausea and vomiting)     PAST SURGICAL HISTORY: Past Surgical History:  Procedure Laterality Date   BACK SURGERY     CHOLECYSTECTOMY     COLONOSCOPY WITH PROPOFOL N/A 05/18/2017   Procedure: COLONOSCOPY WITH PROPOFOL;  Surgeon: [name], MD;  Location: [facility] ENDOSCOPY;  Service: Endoscopy;  Laterality: N/A;   FOOT SURGERY  2015   GALLBLADDER SURGERY  2008   HARDWARE REMOVAL Left 02/14/2016   Procedure: LEFT FOOT REMOVAL DEEP IMPLANT;  Surgeon: [name], MD;  Location: [facility];  Service: Orthopedics;  Laterality: Left;   HERNIA REPAIR     Inguinal Hernia Repair   SPINE SURGERY  2014    FAMILY HISTORY: Family History  Problem Relation Age of Onset   Arthritis Mother    Hypertension Mother    Macular degeneration Mother    Hypertension Father    Hyperlipidemia Father    Heart disease Maternal Grandfather    Diabetes Maternal Grandfather    Kidney disease Paternal Grandmother     SOCIAL HISTORY:  Social History   Socioeconomic History   Marital status: Married    Spouse name: Engineer, technical sales   Number of children: 3   Years of education: 14   Highest education level: Associate degree: academic program  Occupational History   Not on file   Tobacco Use   Smoking status: Never   Smokeless tobacco: Never  Vaping Use   Vaping Use: Never used  Substance and Sexual Activity   Alcohol use: No    Alcohol/week: 0.0 standard drinks of alcohol   Drug use: No   Sexual activity: Not Currently  Other Topics Concern   Not on file  Social History Narrative   Lives with husband    Right handed   Caffeine: 1 cup of coffee in the AM, coke occa. Trying to come off caffeine    Social Determinants of Health   Financial Resource Strain: Low Risk  (01/07/2022)   Overall Financial Resource Strain (CARDIA)    Difficulty of Paying Living Expenses: Not hard at all  Food Insecurity: No Food Insecurity (01/07/2022)   Hunger Vital Sign    Worried About Running Out of Food in the Last Year: Never true    Ran Out of Food in the Last Year: Never true  Transportation Needs: No Transportation Needs (01/07/2022)   PRAPARE - Hydrologist (Medical): No    Lack of Transportation (Non-Medical): No  Physical Activity: Insufficiently Active (01/07/2022)   Exercise Vital Sign    Days of Exercise per Week: 2 days    Minutes of Exercise per Session: 60 min  Stress: No Stress Concern Present (01/07/2022)   [place]  Feeling of Stress : Not at all  Social Connections: Unknown (01/07/2022)   Social Connection and Isolation Panel [NHANES]    Frequency of Communication with Friends and Family: Not on file    Frequency of Social Gatherings with Friends and Family: Not on file    Attends Religious Services: Not on file    Active Member of Clubs or Organizations: Not on file    Attends Archivist Meetings: Not on file    Marital Status: Married  Intimate Partner Violence: Not At Risk (01/07/2022)   Humiliation, Afraid, Rape, and Kick questionnaire    Fear of Current or Ex-Partner: No    Emotionally Abused: No    Physically Abused: No    Sexually Abused:  No     PHYSICAL EXAM  Vitals:   03/25/22 1555  BP: 137/80  Pulse: (!) 102  Weight: 193 lb (87.5 kg)  Height: '5\' 3"'$  (1.6 m)    Body mass index is 34.19 kg/m.   General: The patient is well-developed and well-nourished and in no acute distress   Skin: E she has pedal edema Musculoskeletal:  Back is nontender   Neurologic Exam   Mental status: She is very anxious throughout the whole interaction, similar to the last visit.  The patient is alert and oriented x 3 at the time of the examination. The patient has apparent normal recent and remote memory, with an apparently normal attention span and concentration ability.   Speech is normal.   Cranial nerves: Extraocular movements are full.   Facial strength and sensation was normal.   No obvious hearing deficits are noted.   Motor:  Muscle bulk is normal.   Tone is increased in the legs, left greater than right. Strength is  5 / 5 in the arms, 4+/5 in the right iliopsoas and 5/5 elsewhere in the right leg, 4 -/5 in the left iliopsoas and 4+/5 elsewhere in that leg   Sensory: Sensory testing is intact to pinprick, soft touch and vibration sensation in all 4 extremities.   Coordination: Cerebellar testing reveals good finger-nose-finger but reduced left heel-to-shin.   Gait and station: I did not have her stand at this visit at the last visit, she was  unable to stand up by herself.  She has difficulty bearing her weight, especially on the left.  She can take some steps with bilateral support.   Romberg is negative.    Reflexes: Deep tendon reflexes are symmetric and normal in the arm but the left knee reflex is greater than the right and she has spread.        DIAGNOSTIC DATA (LABS, IMAGING, TESTING) - I reviewed patient records, labs, notes, testing and imaging myself where available.  Lab Results  Component Value Date   WBC 7.3 03/07/2022   HGB 12.6 03/07/2022   HCT 38.2 03/07/2022   MCV 81 03/07/2022   PLT 318 03/07/2022       Component Value Date/Time   NA 139 03/02/2022 0550   NA 141 11/12/2021 1147   NA 138 09/18/2014 1048   K 3.7 03/02/2022 0550   K 4.0 09/18/2014 1048   CL 104 03/02/2022 0550   CL 101 09/18/2014 1048   CO2 28 03/02/2022 0550   CO2 27 09/18/2014 1048   GLUCOSE 112 (H) 03/02/2022 0550   GLUCOSE 88 09/18/2014 1048   BUN 13 03/02/2022 0550   BUN 12 11/12/2021 1147   BUN 9 09/18/2014 1048   CREATININE 0.68 03/02/2022 0550  CREATININE 0.71 04/11/2020 0923   CALCIUM 9.0 03/02/2022 0550   CALCIUM 9.5 09/18/2014 1048   PROT 7.3 02/28/2022 2047   PROT 7.8 09/18/2014 1048   ALBUMIN 4.4 02/28/2022 2047   ALBUMIN 4.6 03/29/2018 1303   ALBUMIN 4.0 09/18/2014 1048   AST 25 02/28/2022 2047   AST 23 09/18/2014 1048   ALT 26 02/28/2022 2047   ALT 42 09/18/2014 1048   ALKPHOS 91 02/28/2022 2047   ALKPHOS 161 (H) 09/18/2014 1048   BILITOT 0.4 02/28/2022 2047   BILITOT 0.3 09/18/2014 1048   GFRNONAA >60 03/02/2022 0550   GFRNONAA >60 09/18/2014 1048   GFRAA >60 12/20/2017 2130   GFRAA >60 09/18/2014 1048   Lab Results  Component Value Date   CHOL 154 03/07/2022   HDL 69 03/07/2022   LDLCALC 58 03/07/2022   LDLDIRECT 127.0 08/17/2020   TRIG 160 (H) 03/07/2022   CHOLHDL 2.2 03/07/2022   Lab Results  Component Value Date   HGBA1C 5.8 (H) 03/07/2022   Lab Results  Component Value Date   MLYYTKPT46 568 03/07/2022   Lab Results  Component Value Date   TSH 2.050 03/07/2022       ASSESSMENT AND PLAN  MS (multiple sclerosis) ([place])  Spasticity  Gait disturbance  Depression with anxiety  OSA on CPAP   Her main problem is anxiety.  She continues to have excessive anxiety which is likely contributing to her perception of pain and worsening her insomnia.  During the day, Seroquel makes her sleepy.  Additionally she has gained quite a bit of weight.  She will discontinue the mirtazapine.   Change Seroqel to risperdal as less weight gain and less sedating during the day.    Continue buspirone.  Continue temazepam.  remain off a DMT for PPMS (Ocrevus had not helped in the past)  She sees her psychologist tomorrow.  I recommended that she discuss with him referral to a psychiatrist as I am not sure I am providing optimal help for her anxiety and and other medical combination may work better for her.   Rtc 6 months  40-minute office visit with the majority of the time spent face-to-face for history and physical, discussion/counseling and decision-making.  Additional time with record review and documentation.  Ynez Eugenio A. Felecia Shelling, MD, PhD, FAAN Certified in Neurology, Clinical Neurophysiology, Sleep Medicine, Pain Medicine and Neuroimaging Director, [place] [facility] [address] [place], [place] [address] [phone]

## 2022-03-25 NOTE — Patient Instructions (Signed)
Stop the mirtazapine Stop Seroquel  Start Risperdal one pill twice a day Continue Buspirone one pill twice a day Continue duloxetine

## 2022-03-25 NOTE — Addendum Note (Signed)
Addended by: Arlice Colt A on: 03/25/2022 06:20 PM   Modules accepted: Level of Service

## 2022-03-26 ENCOUNTER — Encounter: Payer: Medicare Other | Attending: Physical Medicine and Rehabilitation | Admitting: Psychology

## 2022-03-26 ENCOUNTER — Ambulatory Visit: Payer: BC Managed Care – PPO | Admitting: Psychology

## 2022-03-26 DIAGNOSIS — R29898 Other symptoms and signs involving the musculoskeletal system: Secondary | ICD-10-CM | POA: Diagnosis not present

## 2022-03-26 DIAGNOSIS — G4733 Obstructive sleep apnea (adult) (pediatric): Secondary | ICD-10-CM | POA: Diagnosis not present

## 2022-03-26 DIAGNOSIS — G35 Multiple sclerosis: Secondary | ICD-10-CM | POA: Diagnosis not present

## 2022-03-26 DIAGNOSIS — F411 Generalized anxiety disorder: Secondary | ICD-10-CM | POA: Insufficient documentation

## 2022-03-26 DIAGNOSIS — M6281 Muscle weakness (generalized): Secondary | ICD-10-CM | POA: Diagnosis not present

## 2022-03-26 DIAGNOSIS — F482 Pseudobulbar affect: Secondary | ICD-10-CM | POA: Insufficient documentation

## 2022-03-26 DIAGNOSIS — R2681 Unsteadiness on feet: Secondary | ICD-10-CM | POA: Diagnosis not present

## 2022-03-26 DIAGNOSIS — R279 Unspecified lack of coordination: Secondary | ICD-10-CM | POA: Diagnosis not present

## 2022-03-26 DIAGNOSIS — Z8673 Personal history of transient ischemic attack (TIA), and cerebral infarction without residual deficits: Secondary | ICD-10-CM | POA: Diagnosis not present

## 2022-03-26 DIAGNOSIS — F418 Other specified anxiety disorders: Secondary | ICD-10-CM | POA: Diagnosis not present

## 2022-03-26 DIAGNOSIS — R262 Difficulty in walking, not elsewhere classified: Secondary | ICD-10-CM | POA: Diagnosis not present

## 2022-03-26 DIAGNOSIS — R252 Cramp and spasm: Secondary | ICD-10-CM | POA: Diagnosis not present

## 2022-03-26 DIAGNOSIS — I69398 Other sequelae of cerebral infarction: Secondary | ICD-10-CM | POA: Diagnosis not present

## 2022-03-27 ENCOUNTER — Encounter: Payer: Self-pay | Admitting: Psychology

## 2022-03-27 ENCOUNTER — Telehealth: Payer: Self-pay | Admitting: Neurology

## 2022-03-27 NOTE — Telephone Encounter (Signed)
Called husband. He gave Risperdal 0.'5mg'$  tablet at bedtime around 8pm. She could not go to sleep, having pain in legs. Therefore, he gave her seroquel '50mg'$  around 2-3am. Advised she should not take both medications. Per Dr. Garth Bigness last note, she should have stopped seroquel since risperdal was replacing this. Taking both can cause the increased sedation she is experiencing. Today, she also has pain in neck. She does have severe scoliosis.    He confirmed she took risperdal in the morning yesterday. Kept her calmer, legs did not bother her. Felt it was beneficial. Did have a stressful afternoon. Wondering if this caused worsening sx?   Wanting to know if she should continue as prescribed with risperdal? He felt it helped her. He asked about cutting seroquel dose in half and having her wean off for a few days but I advised she should not take both meds together.   Aware I will send message to [name] to review an make recommendation and then I will call back.

## 2022-03-27 NOTE — Telephone Encounter (Signed)
Pt's husband,Carly [name] (on DPR) Last night pt could not sleep, having pain in legs and gave her Seroquel. That seem help her to sleep and pain go away in her legs, but this morning she is very loopy. Would like a call from the nurse

## 2022-03-27 NOTE — Progress Notes (Signed)
Neuropsychology Visit  Patient:  Carly [name]   DOB: 1951/01/26  MR Number: [id number]  Location: [facility] [place], [place] [id number] [place] [address] Dept: [phone]  Date of Service: 03/26/2022  Start: 4 PM  End: 5 PM  Today's visit was a 1 hour visit that was conducted in my outpatient clinic office.  It was in person visit.  The patient, her husband and myself were present.  This was a follow-up from initial evaluation that was conducted on 05/14/2021.  Duration of Service: 1 Hour  Provider/Observer:     Edgardo Roys PsyD  Chief Complaint:      Chief Complaint  Patient presents with   Cerebrovascular Accident   Anxiety   Depression    Reason For Service:      Carly [name] is a 71 year old female referred for follow-up visit after her inpatient hospitalization in March of this year.  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  The patient is continued to have right arm motor deficits and difficulty with use.  She is also having trouble eating and swallowing and trouble getting in and out of the shower with increased fear of falling.  The patient's husband reports that the patient has fallen a number of times and while not falling in the shower she has a great deal of phobia and fear around falling in the shower.  They describe an overreaction to getting into the shower and she still has trouble with her right dominant hand especially when in use.  The patient's past medical history includes history of MS, GERD, anxiety/depression, headache.  The patient had presented to the emergency department on 12/08/2020 after a 1 day history of weakness with difficulty standing, dizziness, mental status changes and difficulty picking  up items with her right hand.  MRI of her brain was done revealing acute white matter infarcts and deep white matter left corona radiata and extensive abnormal T2/FLAIR hyperintense signal.  MRI brain was negative for stenosis or LVO but showed diffuse atherosclerotic irregularity most severe in PCA branches.  Neurology felt that the stroke was due to small vessel disease.  During her hospitalization psychiatry was initially consulted with assistance due to management of chronic generalized anxiety disorder with bouts of lability.  The patient has chronic left-sided weakness likely due to MS and now right-sided weakness affecting her balance and motor functioning.  Patient had a right lateral lean with left knee buckling along with initial reduced information processing speed and difficulties with memory recall versus an inability to learn and store new information.  I did see the patient during her inpatient hospitalization on the comprehensive rehabilitation unit for 1 visit.  This appointment is a follow-up from her rehab stay and a result of her continuation of difficulties.  The patient acknowledges an exacerbation of her longstanding anxiety and depressive symptomatology.  She reports that fear of falling and other difficulties with present.  Treatment Interventions:  Therapeutic interventions around issues associated with exacerbation of longstanding anxiety and depression and the development of pseudobulbar affect type symptoms.  Participation Level:   Active  Participation Quality:  Appropriate      Behavioral Observation:  Well Groomed, Alert, and Appropriate.   Current Psychosocial Factors: The patient reports that she has  continued to have issues with her anxiety which have had an impact on her functioning.  Swelling in her legs and changes in motor functioning are all quite problematic.  Content of Session:   Reviewed current symptoms and continue to work on therapeutic interventions  around the emotional impact her residual cognitive deficits and motor deficits have had since her stroke.  Effectiveness of Interventions: Patient was open and actively engaged in therapeutic process.  Target Goals:   Will better coping skills around adjustment to residual effects of her CVA.  Goals Last Reviewed:   03/26/2022  Goals Addressed Today:    Today we worked on cognitive behavioral therapeutic interventions around better management of her residual effects of her significant medical issues.  Impression/Diagnosis:   Carly [name] is a 71 year old female referred for follow-up visit after her inpatient hospitalization in March of this year.  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  The patient is continued to have right arm motor deficits and difficulty with use.  She is also having trouble eating and swallowing and trouble getting in and out of the shower with increased fear of falling.  The patient's husband reports that the patient has fallen a number of times and while not falling in the shower she has a great deal of phobia and fear around falling in the shower.  They describe an overreaction to getting into the shower and she still has trouble with her right dominant hand especially when in use.  The patient's past medical history includes history of MS, GERD, anxiety/depression, headache.  The patient reports that she has continued to be followed by neurology and physiatry and has made some improvements but she has had a lot of anxiety recently.  Attempts to change medication have been implemented and today was her first day taking respite all and reported that she is having a lot of sleepiness today.  She was very apologetic during our visit today although she did appear to stay awake she did acknowledge and reports some more cognitive dullness today she  attributed to medication changes.  Diagnosis:   History of CVA (cerebrovascular accident)  Generalized anxiety disorder  OSA (obstructive sleep apnea)  PBA (pseudobulbar affect)    Ilean Skill, Psy.D. Clinical Psychologist Neuropsychologist

## 2022-03-27 NOTE — Telephone Encounter (Signed)
LVM relaying Dr. Garth Bigness recommendation. Asked husband to call back if they have any more questions or concerns.

## 2022-03-28 ENCOUNTER — Emergency Department: Payer: Medicare Other

## 2022-03-28 ENCOUNTER — Encounter: Payer: Self-pay | Admitting: Intensive Care

## 2022-03-28 ENCOUNTER — Encounter (HOSPITAL_BASED_OUTPATIENT_CLINIC_OR_DEPARTMENT_OTHER): Payer: Medicare Other | Admitting: Physical Medicine and Rehabilitation

## 2022-03-28 ENCOUNTER — Other Ambulatory Visit: Payer: Self-pay

## 2022-03-28 ENCOUNTER — Emergency Department
Admission: EM | Admit: 2022-03-28 | Discharge: 2022-03-28 | Disposition: A | Discharge status: Home / Self Care | Payer: Medicare Other | Attending: Emergency Medicine | Admitting: Emergency Medicine

## 2022-03-28 ENCOUNTER — Telehealth: Payer: Self-pay | Admitting: Neurology

## 2022-03-28 ENCOUNTER — Other Ambulatory Visit: Payer: Self-pay | Admitting: Neurology

## 2022-03-28 DIAGNOSIS — R279 Unspecified lack of coordination: Secondary | ICD-10-CM | POA: Diagnosis not present

## 2022-03-28 DIAGNOSIS — R2681 Unsteadiness on feet: Secondary | ICD-10-CM | POA: Diagnosis not present

## 2022-03-28 DIAGNOSIS — I69398 Other sequelae of cerebral infarction: Secondary | ICD-10-CM | POA: Diagnosis not present

## 2022-03-28 DIAGNOSIS — M6281 Muscle weakness (generalized): Secondary | ICD-10-CM | POA: Diagnosis not present

## 2022-03-28 DIAGNOSIS — R262 Difficulty in walking, not elsewhere classified: Secondary | ICD-10-CM | POA: Diagnosis not present

## 2022-03-28 DIAGNOSIS — Z79899 Other long term (current) drug therapy: Secondary | ICD-10-CM | POA: Diagnosis not present

## 2022-03-28 DIAGNOSIS — R29898 Other symptoms and signs involving the musculoskeletal system: Secondary | ICD-10-CM | POA: Diagnosis not present

## 2022-03-28 DIAGNOSIS — Z8673 Personal history of transient ischemic attack (TIA), and cerebral infarction without residual deficits: Secondary | ICD-10-CM | POA: Diagnosis not present

## 2022-03-28 DIAGNOSIS — I6381 Other cerebral infarction due to occlusion or stenosis of small artery: Secondary | ICD-10-CM | POA: Diagnosis not present

## 2022-03-28 DIAGNOSIS — R531 Weakness: Secondary | ICD-10-CM | POA: Insufficient documentation

## 2022-03-28 DIAGNOSIS — G35 Multiple sclerosis: Secondary | ICD-10-CM | POA: Diagnosis not present

## 2022-03-28 DIAGNOSIS — R42 Dizziness and giddiness: Secondary | ICD-10-CM | POA: Diagnosis not present

## 2022-03-28 HISTORY — DX: Scoliosis, unspecified: M41.9

## 2022-03-28 HISTORY — DX: Essential (primary) hypertension: I10

## 2022-03-28 HISTORY — DX: Lymphedema, not elsewhere classified: I89.0

## 2022-03-28 LAB — CBC
HCT: 41.8 % (ref 36.0–46.0)
Hemoglobin: 13.4 g/dL (ref 12.0–15.0)
MCH: 26.9 pg (ref 26.0–34.0)
MCHC: 32.1 g/dL (ref 30.0–36.0)
MCV: 83.9 fL (ref 80.0–100.0)
Platelets: 325 10*3/uL (ref 150–400)
RBC: 4.98 MIL/uL (ref 3.87–5.11)
RDW: 15.2 % (ref 11.5–15.5)
WBC: 10.3 10*3/uL (ref 4.0–10.5)
nRBC: 0 % (ref 0.0–0.2)

## 2022-03-28 LAB — COMPREHENSIVE METABOLIC PANEL
ALT: 24 U/L (ref 0–44)
AST: 23 U/L (ref 15–41)
Albumin: 4.5 g/dL (ref 3.5–5.0)
Alkaline Phosphatase: 81 U/L (ref 38–126)
Anion gap: 9 (ref 5–15)
BUN: 14 mg/dL (ref 8–23)
CO2: 26 mmol/L (ref 22–32)
Calcium: 9.5 mg/dL (ref 8.9–10.3)
Chloride: 106 mmol/L (ref 98–111)
Creatinine, Ser: 0.68 mg/dL (ref 0.44–1.00)
GFR, Estimated: >60 mL/min (ref >60)
Glucose, Bld: 177 mg/dL — ABNORMAL HIGH (ref 70–99)
Potassium: 3.8 mmol/L (ref 3.5–5.1)
Sodium: 141 mmol/L (ref 135–145)
Total Bilirubin: 0.6 mg/dL (ref 0.3–1.2)
Total Protein: 7.4 g/dL (ref 6.5–8.1)

## 2022-03-28 LAB — DIFFERENTIAL
Abs Immature Granulocytes: 0.03 10*3/uL (ref 0.00–0.07)
Basophils Absolute: 0 10*3/uL (ref 0.0–0.1)
Basophils Relative: 0 %
Eosinophils Absolute: 0.1 10*3/uL (ref 0.0–0.5)
Eosinophils Relative: 1 %
Immature Granulocytes: 0 %
Lymphocytes Relative: 16 %
Lymphs Abs: 1.6 10*3/uL (ref 0.7–4.0)
Monocytes Absolute: 0.5 10*3/uL (ref 0.1–1.0)
Monocytes Relative: 5 %
Neutro Abs: 8 10*3/uL — ABNORMAL HIGH (ref 1.7–7.7)
Neutrophils Relative %: 78 %

## 2022-03-28 LAB — ETHANOL: Alcohol, Ethyl (B): <10 mg/dL (ref <10)

## 2022-03-28 MED ORDER — GADOBUTROL 1 MMOL/ML IV SOLN
9.0000 mL | Freq: Once | INTRAVENOUS | Status: AC | PRN
  Administered 2022-03-28: 9 mL via INTRAVENOUS

## 2022-03-28 NOTE — ED Provider Notes (Signed)
[facility] Provider Note    Event Date/Time   First MD Initiated Contact with Patient 03/28/22 1523     (approximate)   History   Weakness   HPI  Carly [name] is a 71 y.o. female with a history of CVA, multiple sclerosis, reported medication sensitivity who presents with complaints of lower extremity weakness bilaterally.  She reports is been ongoing for 2 days.  Prior to that she was able to stand with assistance.  Her neurologist has been adjusting her medications and she thinks this may be the cause.  She started taking Risperdal 2 days ago     Physical Exam   Triage Vital Signs: ED Triage Vitals [03/28/22 1448]  Enc Vitals Group     BP (!) 166/76     Pulse Rate (!) 120     Resp 20     Temp 98.1 F (36.7 C)     Temp Source Oral     SpO2 99 %     Weight 86.2 kg (190 lb)     Height 1.6 m ('5\' 3"'$ )     Head Circumference      Peak Flow      Pain Score 0     Pain Loc      Pain Edu?      Excl. in [place]?     Most recent vital signs: Vitals:   03/28/22 1800 03/28/22 1900  BP: 134/66   Pulse: 88   Resp: 18 18  Temp:    SpO2: 99%      General: Awake, no distress.  CV:  Good peripheral perfusion.  Resp:  Normal effort.  Abd:  No distention.  Other:  Unable to raise her lower extremities against gravity, she reports that when she tries to bear weight they feel like "Jell-O", warm and well-perfused   ED Results / Procedures / Treatments   Labs (all labs ordered are listed, but only abnormal results are displayed) Labs Reviewed  DIFFERENTIAL - Abnormal; Notable for the following components:      Result Value   Neutro Abs 8.0 (*)    All other components within normal limits  COMPREHENSIVE METABOLIC PANEL - Abnormal; Notable for the following components:   Glucose, Bld 177 (*)    All other components within normal limits  CBC  ETHANOL  PROTIME-INR  APTT     EKG     RADIOLOGY MRI brain negative for acute  CVA    PROCEDURES:  Critical Care performed:   Procedures   MEDICATIONS ORDERED IN ED: Medications  gadobutrol (GADAVIST) 1 MMOL/ML injection 9 mL (9 mLs Intravenous Contrast Given 03/28/22 1846)     IMPRESSION / MDM / [place] / ED COURSE  I reviewed the triage vital signs and the nursing notes. Patient's presentation is most consistent with acute presentation with potential threat to life or bodily function.  Patient presents with lower extremity weakness in the setting of multiple comorbidities.  Differential includes multiple sclerosis flare, CVA (she reports typical weakness in the left leg but now also weakness in the right leg), electrolyte abnormality, medication sensitivity.  Lab work reviewed and is reassuring, CBC, CMP, glucose unremarkable.  We will send for MRI with and without contrast   MRI results are reassuring, no evidence of CVA or MS flare.  Patient is relieved to hear that.  She will discuss with her neurologist adjusting medications, considered admission however patient feels comfortable with discharge back to facility.  FINAL CLINICAL IMPRESSION(S) / ED DIAGNOSES   Final diagnoses:  Weakness     Rx / DC Orders   ED Discharge Orders     None        Note:  This document was prepared using Dragon voice recognition software and may include unintentional dictation errors.   Lavonia Drafts, MD 03/28/22 Lurena Nida

## 2022-03-28 NOTE — ED Triage Notes (Signed)
Patient arrived by EMS from [facility] for inability to walk/weakness. Patient believes this is from starting Risperdal Wednesday and has taken everyday as prescribed. PCP sent patient here to rule out stroke, MS flare up or from new medication. Patient alert to self and situation. Disoriented to time

## 2022-03-28 NOTE — ED Notes (Signed)
Pt. Returned to ED rm 8

## 2022-03-28 NOTE — Progress Notes (Signed)
Subjective:    Patient ID: Carly [name], female    DOB: 01-22-1951, 71 y.o.   MRN: [id number]  HPI  An audio/video tele-health visit is felt to be the most appropriate encounter for this patient at this time. This is a follow up tele-visit via phone. The patient is at home. MD is at office. Prior to scheduling this appointment, our staff discussed the limitations of evaluation and management by telemedicine and the availability of in-person appointments. The patient expressed understanding and agreed to proceed.    Carly [name] is a 71 year old woman who presents for follow-up of white matter periventricular CVA, presents for follow-up regarding lymphedema, anxiety,chronic fatigue, insomnia, OSA, and weight gain, and new lower extremity weakness today.    1) Anxiety:  -her psychiatrist previously transitioned her from Seroquel back to benzodiazepine due to side effect profile of Seroquel, but this worsened her anxiety so we restarted it -she has been having a lot of anxiety related to her and her husband's move from their current home into a retirement home and she asks whether she may take the seroquel additionally as needed- once in the morning, once later in the day, and two at night -she is interested in following with a behavioral therapist -she and husband Carly [name] were scared of side effects of Seroquel that were discussed- she has not noted any this far.  -she has been sleeping but her legs feel very stiff at night -she continues to take Cymbalta '60mg'$  -we tried weaning her off the mirtazepine but she experienced weakness and insomnia and wanted to restart- titrated back up to her former dose.  -she discussed with her husband, who is also present on this call today, and she would prefer to restart Seroquel and stop Lorazepam.  -she has not had any counseling and would like to restart this. She has an appointment with [name] in August and would like to start counseling earlier  than this if possible.  -she loved speaking with [name] inpatient and would like to follow with him -she had symptoms of dizziness yesterday at the hairdresser and was asked to contact me regarding if this could be from the seroquel. She had had no other similar episodes since starting it.  -she has been sleeping well with the Seroquel at night.  -she would like a list of foods that can help anxiety.  -she asks about the dietary advice I gave her last visit regarding anxiety. -she has been using essential oil.  -anxiety has been bad -off prednisone now -exercise helps her.  -she gets upset over things easily -she wants to be able to calm down.  -she needs refills of her cymbalta and [name]  2) MS: -has been having a lot of stiffness recently -EMS called twice due to difficulty getting her up off the floor -is receiving home therapy -she would like to see a different neurologist and asked for my recommendation   3) Lymphedema  -has significant bilateral lower extremity  -has greatly improved with lymphedema therapy -she recently received a device that has helped so much that she was able to get off of Lasix.  -has ordered compression garments -does not eat processed foods or salt.  -she has ordered something from [employer] to give her lymphatic massage.  -improving but still very bothersome to her -discussed bloodwork today  4) HTN: -BP 138/79  5) Decreased appetite:  -resolved with increasing [name]  6) She developed pain in right side, and not  sure if this is due to topamax or the antibiotics she is on to treat a respiratory infection.   7) Insomnia:  -sleeping poorly due to her CPAP mask, pressure is rising at night and this is causing mask leak.  -respiratory therapist will seeing her tomorrow.  -she asks about Inspire.  -she follows with [name] and she says he is also a sleep doctor -she hates to reduce the medicine.  -she has tried 5 different  masks -the machine is very loud.  -she can sleep without the CPAP fine.   8) Chest congestion -she has been having symptoms of congestion for greater than 3 months -Her father saw 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 She received a call to schedule an appointment with [name] office and is thankful for the referral.  -she asks what else she can do to help with this -she has been through multiple rounds of antibiotics without benefit.  -also feeling diffuse joint aches.   9) GERD -has been experiencing after eating  10) Sinus congestion -pleased that her CT results were normal.   11) OSA -she notes that she was found to have OSA on recent sleep study and she was tearful about this as she feels it is just another condition she has -she was surprised as she feels that she sleeps well.  -she has note been sleeping well while using the CPAP mask and was told that O2 is leaking -she is working with a respiratory therapist -she feels anxiety about whether she will get enough sleep tonight, she has only been sleeping about 4 hours since using the CPAP -she has decided she does not want to pursue Inspire yet until she has learned more about it.  -she had a very productive visit with [name] and he advised her to try her best to tolerate the CPAP mask, which she is doing.  -followed up with pulmonology regarding [name] and was told she would be a good candidate for this treatment, but she should prefer to continue trying CPAP mask before trying Inspire  12) Chronic fatigue -she was diagnosed with COVID 9 days ago and her breathing has improved but she has been feeling very fatigued -she has been taking the N-acetyl-cysteine supplement -she asks about taking injetable B12 as this really helped her son -she has not been wearing her CPAP as she is waiting for a new mask  13) Pain -takes Tylenol and  baclofen  14) Lower extremity weakness: -patient notes new lower extremity weakness that started this morning -her legs feel stiff -no other focal deficits -she started Risperdal on Wednesday night and asks if this could be causing her weakness -[name] has been trying to wean her off Mirtazapine and Seroquel due to weight gain and onto Risperdal for her anxiety    Pain Inventory Average Pain 5 Pain Right Now 2 My pain is aching and no pain.  Sometimes spasm in legs at night because of MST  In the last 24 hours, has pain interfered with the following? General activity 4 Relation with others 7 Enjoyment of life 5  What TIME of day is your pain at its worst? night Sleep (in general) Fair  Pain is worse with: sitting and unsure Pain improves with: medication Relief from Meds: 5  Family History  Problem Relation Age of Onset   Arthritis Mother    Hypertension Mother    Macular degeneration Mother  Hypertension Father    Hyperlipidemia Father    Heart disease Maternal Grandfather    Diabetes Maternal Grandfather    Kidney disease Paternal Grandmother    Social History   Socioeconomic History   Marital status: Married    Spouse name: Engineer, technical sales   Number of children: 3   Years of education: 14   Highest education level: Associate degree: academic program  Occupational History   Not on file  Tobacco Use   Smoking status: Never   Smokeless tobacco: Never  Vaping Use   Vaping Use: Never used  Substance and Sexual Activity   Alcohol use: No    Alcohol/week: 0.0 standard drinks of alcohol   Drug use: No   Sexual activity: Not Currently  Other Topics Concern   Not on file  Social History Narrative   Lives with husband    Right handed   Caffeine: 1 cup of coffee in the AM, coke occa. Trying to come off caffeine    Social Determinants of Health   Financial Resource Strain: Low Risk  (01/07/2022)   Overall Financial Resource Strain (CARDIA)    Difficulty of Paying  Living Expenses: Not hard at all  Food Insecurity: No Food Insecurity (01/07/2022)   Hunger Vital Sign    Worried About Running Out of Food in the Last Year: Never true    Ran Out of Food in the Last Year: Never true  Transportation Needs: No Transportation Needs (01/07/2022)   PRAPARE - Hydrologist (Medical): No    Lack of Transportation (Non-Medical): No  Physical Activity: Insufficiently Active (01/07/2022)   Exercise Vital Sign    Days of Exercise per Week: 2 days    Minutes of Exercise per Session: 60 min  Stress: No Stress Concern Present (01/07/2022)   [place]    Feeling of Stress : Not at all  Social Connections: Unknown (01/07/2022)   Social Connection and Isolation Panel [NHANES]    Frequency of Communication with Friends and Family: Not on file    Frequency of Social Gatherings with Friends and Family: Not on file    Attends Religious Services: Not on file    Active Member of Clubs or Organizations: Not on file    Attends Archivist Meetings: Not on file    Marital Status: Married   Past Surgical History:  Procedure Laterality Date   BACK SURGERY     CHOLECYSTECTOMY     COLONOSCOPY WITH PROPOFOL N/A 05/18/2017   Procedure: COLONOSCOPY WITH PROPOFOL;  Surgeon: [name], MD;  Location: [facility] ENDOSCOPY;  Service: Endoscopy;  Laterality: N/A;   FOOT SURGERY  2015   GALLBLADDER SURGERY  2008   HARDWARE REMOVAL Left 02/14/2016   Procedure: LEFT FOOT REMOVAL DEEP IMPLANT;  Surgeon: [name], MD;  Location: [place];  Service: Orthopedics;  Laterality: Left;   HERNIA REPAIR     Inguinal Hernia Repair   SPINE SURGERY  2014   Past Surgical History:  Procedure Laterality Date   BACK SURGERY     CHOLECYSTECTOMY     COLONOSCOPY WITH PROPOFOL N/A 05/18/2017   Procedure: COLONOSCOPY WITH PROPOFOL;  Surgeon: [name], MD;  Location: [facility]  ENDOSCOPY;  Service: Endoscopy;  Laterality: N/A;   FOOT SURGERY  2015   GALLBLADDER SURGERY  2008   HARDWARE REMOVAL Left 02/14/2016   Procedure: LEFT FOOT REMOVAL DEEP IMPLANT;  Surgeon: [name], MD;  Location: [place];  Service: Orthopedics;  Laterality: Left;   HERNIA REPAIR     Inguinal Hernia Repair   SPINE SURGERY  2014   Past Medical History:  Diagnosis Date   Allergy    Anxiety    Aspiration pneumonia (HCC)    Depression    Frequent headaches    H/O   GERD (gastroesophageal reflux disease)    History of chicken pox    History of colon polyps    Hx of migraines    Multiple sclerosis ([place]) 2011   OSA (obstructive sleep apnea)    PONV (postoperative nausea and vomiting)    There were no vitals taken for this visit.  Opioid Risk Score:   Fall Risk Score:  `1  Depression screen PHQ 2/9     01/07/2022    3:15 PM 11/05/2021   10:10 AM 08/27/2021   11:12 AM 08/15/2021   10:50 AM 07/19/2021    3:28 PM 06/18/2021    9:24 AM 05/31/2021    1:47 PM  Depression screen PHQ 2/9  Decreased Interest 0 0 2 0 '1 3 3  '$ Down, Depressed, Hopeless 0 1 2 0 '1 3 3  '$ PHQ - 2 Score 0 1 4 0 '2 6 6    '$ Review of Systems  Constitutional: Negative.   HENT: Negative.    Eyes: Negative.   Respiratory:  Negative for cough.        Chest congestion  Cardiovascular: Negative.   Gastrointestinal: Negative.   Endocrine: Negative.   Genitourinary: Negative.   Musculoskeletal:  Positive for back pain and gait problem.  Skin: Negative.   Allergic/Immunologic: Negative.   Hematological:  Bruises/bleeds easily.       Plavix  Psychiatric/Behavioral:  Positive for dysphoric mood.   All other systems reviewed and are negative.      Objective:   Physical Exam Patient seen via phone     Assessment & Plan:  1) Anxiety: -discussed with patient the side effects of both Seroquel and Lorazepam, as well as potential interactions with other medications. -discussed her move because  it is stressing her.  -discussed whether we should increase Seroquel due to her anxiety. Discussed that she is currently taking 12.'5mg'$  3 times per day PRN -discussed about her stress of seeing many different doctors -advised to always use the lowest dose of medications that she needs to minimize side effects -discussed the buspar that was started for her by Dr. Kerman Passey- she has not yet noted benefits from this, advised that she does not need to take it if she does not feel benefits.  -referred to psychiatry in [facility] for CBT -reinforced importance of thinking positively.  -continue to encourage non-pharmacologic management approaches as well, such as meditation, applying lavender oil, and exercise- all of which she has been trying and which have been helping.  -discussed alternative anxiety medications.  -continue seroquel 12.'5mg'$  daily and '25mg'$  at night. Can decreased to '25mg'$  at night and assess positive/negative effects during the day. Can restart 1/2 tab during the day if anxiety is uncontrolled.  -discontinue lorazepam.  -establish care with [name] for neuropsych counseling on 8/2. They really valued the appointment with him.  -discontinue topamax in case pain in right side is due to medication- discussed can also be due to antibiotics which she is taking for respiratory infection- discontinuing the topamax will help [place] know if it is the cause of her pain or not. -refilled [name] '30mg'$  HS.  -refilled Cymbalta '60mg'$  -  Discussed exercise and meditation as tools to decrease anxiety. -Recommended Down Dog Yoga app -Discussed spending time outdoors. -Discussed positive re-framing of anxiety.  -Discussed the following foods that have been show to reduce anxiety: 1) [place] nuts, mushrooms, soy beans due to their high selenium content. Upper limit of toxicity of selenium is 477mg/day so no more than 3-4 bBolivianuts per day.  2) Fatty fish such as salmon, mackerel, sardines, trout, and  herring- high in omega-3 fatty acids 3) Eggs- increases serotonin and dopamine 4) Pumpkin seeds- high in omega-3 fatty acids 5) dark chocolate- high in flavanols that increase blood flow to brain 6) turmeric- take with black pepper to increase absorption 7) chamomile tea- antioxidant and anti-inflammatory properties 8) yogurt without sugar- supports gut-brain axis 9) green tea- contains L- theanine 10) blueberries- high in vitamin C and antioxidants 11) t[place] high in tryptophan which gets converted to serotonin 12) bell peppers- rich in vitamin C and antioxidants 13) citrus fruits- rich in vitamin C and antioxidants 14) almonds- high in vitamin E and healthy fats 15) chia seeds- high in omega-3 fatty acids  2) MS -continue home therapy to minimize stiffness/maximize mobility -f/u with MS specialist. Discussed her appointmenr with Dr. SKerman Passey  -provided with neurology referral -discussed trying lipoic acid  3) HTN:  -discussed good blood pressure today.  BP has been 130s-150s/80s, recommended citrus foods and nuts, as much mobility as she can tolerate, log Bps daily and bring log to f/u appointment.  -continue current regimen and checking and checking BP.  -advised citrus foods and nuts to help lower BP. Discussed that once BP is consistently 120/80 we can wean her Losartan to '25mg'$ .   4) Bilateral lower extremity edema: - continue lymphedema therapy which is greatly helping.  -Lasix discontinued. Discussed that this should also improve her kidney function -advised low salt diet.  -prescribed new compression garments, will fax to company.  -discussed doing the compression garments early in the morning at 9am  -discussed that her kidney function is normal on last check in March.   5) Insomnia: -recommended tart cherry juice with dinner, valerian root and chamomile teas in evening, applying lavender drops to forehead at night. -continue [name] '30mg'$  HS- discussed that we will  not try to wean off again since we saw how much this is benefittng her -discontinue amantadine.  -discussed considering the Inspire trial since he has tried the CPAP for a long time now and can not tolerate it.   6) Obesity BMI 33.13 -attempted to wean off [name] but she developed worsening insomnia, anxiety, and decreased appetite that was undesirable to her, so have restarted the medication.  -discussed trial of avoiding gluten for 3 months and how this could potentially help with her weight loss, anxiety, and prevention/slowing of her chronic diseases. -discussed benefits of intermittent fasting- made goal to set a consistent dinner time and avoid snacking after this time. If she is hungry, choose a healthy snack of nuts, fruit, or vegetables.  -provided a link to a pdf of Pete Escogue's musculoskeletal alignment exercises: [email or web address]   7) Chest congestion -referred for pulmonary consult with Dr. HRenee Harder who treated her father for pulmonary fibrosis. She has made an appointment to establish care with him.  -recommended drinking daily warm water with honey, lime and ginger -recommended drinking tea with holy Basil (Tulsi)  8) Sore throat: -continue salt water gargling  9) Sinus congestion: -recommended humidifier -drink 6-8 glasses of water per day.   10) fatigue -checked  B12, folate, TSH, vitamin D, magnesium and discussed bloodwork with patient and husband: all in normal range except for suboptimal Vitamin D- high dose supplement prescribed -Recommended continuing NAC (N-Acetyl cysteine) '600mg'$  BID for her fatigue. Discussed its benefits in boosting glutathione and mitochondrial function. -prescribed monthly injectable B12 as patient would like to try this. Discussed that there are few risks to having elevated B12 levels -discussed that not wearing her CPAP is also likely contributing to her fatigue -discussed  modafinil but she prefers to try the B12 first   11) OSA -discussed that OSA can contribute to daytime fatigue, weight gain, anxiety, and chronic conditions so it is good to get this condition treated. -referred to Dr. Melida Quitter for eval for [facility] given that she is having a hard time sleeping with her CPAP. Discussed her decision not to pursue Inspire at this time and instead try to tolerate her current mask as best as possible. Discussed her positive conversation with [name]. -advised her to talk to her respiratory therapist about fitting another mask -discussed Inspire but she says unfortunately it is not covered by insurance.  -discussed strap under the chin.  -discussed following up with pulmonology for Inspire.  -agreed with her decision to continue to try CPAP before pursuing Inspire due to her fears of the procedure  12) Low back pain: -recommended doTerra Deep Blue Essential oil and applied to area of pain today- discussed that this is made of natural plant oils. Shared by personal experience of benefit from use of this essential oil -provided a link to a pdf of Pete Escogue's musculoskeletal alignment exercises: [email or web address].berger.[email or web address]   13) Lower extremity weakness: -discussed that this could be a possible side effect of the Risperdal.  -Recommended calling Dr. Garth Bigness office to let him know about the lower extremity weakness -discussed that she should go to the ED as unilateral lower extremity weakness could be a symptom of stroke or MS flare -discussed stopping Risperdal until she can talk with [name] -Discussed using Seroquel prn for anxiety in the meantime  20 minutes spent in discussion of patient's lower extremity weakness, her starting of Risperdal, advising her to call Dr. Garth Bigness office to discuss symptoms and whether they could be from Risperdal, if she can not get in touch with Dr Kerman Passey recommended going to  ED as lower extremity weakness could be a sign of MS flare or stroke, discussed that she could try prn Seroquel for anxiety in the meantime if she really needs

## 2022-03-28 NOTE — ED Notes (Signed)
Pt in MRI.

## 2022-03-28 NOTE — ED Notes (Signed)
MRI states they will come get the pt. Around 1830, pt. And family updated.

## 2022-03-28 NOTE — ED Triage Notes (Signed)
First Nurse Note;  Pt via EMS from [place], pt c/o weakness and numbness in the L leg since Thursday. Denies any weakness or numbness at this time. Pt states she was seen 3 weeks ago for the same and it was a pinched nerve. Pt is A&Ox4 and NAD  153/74 BP  115 HR 100% on RA

## 2022-03-28 NOTE — Telephone Encounter (Signed)
Pt said Risperdal making legs feel so heavy can not move them. Legs would not work this morning when got up this morning. But are a little bit better than last night. Would like a call back

## 2022-03-28 NOTE — ED Notes (Signed)
Pt on her way to CT from triage. Will assess when she arrives to ED rm 8

## 2022-03-28 NOTE — Telephone Encounter (Signed)
Caregiver called, patient acting "loopy", difficulty with speech, difficulty with ambulation, they have decided to take her to the hospital.  It appears was recently placed on some new medications which could be causing side effects however  I agree she should be evaluated.

## 2022-03-28 NOTE — ED Notes (Addendum)
Pt. Speaking with MRI for screening

## 2022-03-31 ENCOUNTER — Encounter (HOSPITAL_BASED_OUTPATIENT_CLINIC_OR_DEPARTMENT_OTHER): Payer: Medicare Other | Admitting: Physical Medicine and Rehabilitation

## 2022-03-31 ENCOUNTER — Encounter: Payer: Self-pay | Admitting: Hematology and Oncology

## 2022-03-31 DIAGNOSIS — F411 Generalized anxiety disorder: Secondary | ICD-10-CM | POA: Diagnosis not present

## 2022-03-31 DIAGNOSIS — R29898 Other symptoms and signs involving the musculoskeletal system: Secondary | ICD-10-CM

## 2022-03-31 DIAGNOSIS — F418 Other specified anxiety disorders: Secondary | ICD-10-CM

## 2022-03-31 MED ORDER — SERTRALINE HCL 25 MG PO TABS
25.0000 mg | ORAL_TABLET | Freq: Every day | ORAL | 3 refills | Status: DC
Start: 2022-03-31 — End: 2022-04-17

## 2022-03-31 NOTE — Telephone Encounter (Addendum)
Called spouse, Maliea Grandmaison at [phone]. Confirmed she went to [facility] ED 03/28/22.  Husband feels meds changed around too quickly. She has stopped the Risperdal and went back to taking Seroquel '25mg'$  po qhs. She is doing better since discharge. More alert/legs back to baseline. However, husband states pt in denial about dx/sx. Feels she can exercise and get better. Aware I will send to MD for review.  If he recommends any further changes or f/u in office, I will call back.

## 2022-03-31 NOTE — Telephone Encounter (Signed)
Called husband back. Relayed Dr. Garth Bigness message. They are agreeable to referral to psychiatry. They are going to contact [name] first. He mentioned someone in particular for a recommendation. They will then call us back.

## 2022-03-31 NOTE — Addendum Note (Signed)
Addended by: Wyvonnia Lora on: 03/31/2022 09:48 AM   Modules accepted: Orders

## 2022-04-01 ENCOUNTER — Telehealth: Payer: Self-pay | Admitting: Neurology

## 2022-04-01 ENCOUNTER — Telehealth: Payer: Self-pay

## 2022-04-01 NOTE — Telephone Encounter (Signed)
Husband Carly [name] on [place] called and LVM requesting a call from the RN, no other information was left. I called husband to get more information but there was no answer so VM was left for him to call back.

## 2022-04-01 NOTE — Telephone Encounter (Signed)
Patient states that [facility] will be faxing [place] a request for a prescription for a bilateral piece that can be hooked to lymphedema pump.  Patient states this piece will allow her to use both of the compression pumps on both of her legs daily.  Patient states that once she has the piece, then someone is going to come out to re-train her on how to use the pumps.

## 2022-04-02 ENCOUNTER — Encounter (HOSPITAL_BASED_OUTPATIENT_CLINIC_OR_DEPARTMENT_OTHER): Payer: Medicare Other | Admitting: Physical Medicine and Rehabilitation

## 2022-04-02 ENCOUNTER — Telehealth: Payer: Self-pay | Admitting: *Deleted

## 2022-04-02 DIAGNOSIS — R2681 Unsteadiness on feet: Secondary | ICD-10-CM | POA: Diagnosis not present

## 2022-04-02 DIAGNOSIS — R262 Difficulty in walking, not elsewhere classified: Secondary | ICD-10-CM | POA: Diagnosis not present

## 2022-04-02 DIAGNOSIS — F411 Generalized anxiety disorder: Secondary | ICD-10-CM | POA: Diagnosis not present

## 2022-04-02 DIAGNOSIS — M6281 Muscle weakness (generalized): Secondary | ICD-10-CM | POA: Diagnosis not present

## 2022-04-02 DIAGNOSIS — I69398 Other sequelae of cerebral infarction: Secondary | ICD-10-CM | POA: Diagnosis not present

## 2022-04-02 DIAGNOSIS — G35 Multiple sclerosis: Secondary | ICD-10-CM | POA: Diagnosis not present

## 2022-04-02 DIAGNOSIS — F418 Other specified anxiety disorders: Secondary | ICD-10-CM | POA: Diagnosis not present

## 2022-04-02 DIAGNOSIS — R279 Unspecified lack of coordination: Secondary | ICD-10-CM | POA: Diagnosis not present

## 2022-04-02 MED ORDER — TIZANIDINE HCL 4 MG PO TABS: 4.0000 mg | ORAL_TABLET | Freq: Four times a day (QID) | ORAL | 11 refills | Status: DC | PRN

## 2022-04-02 NOTE — Telephone Encounter (Signed)
Mrs Helm called to request that Dr Ranell Patrick prescribe for her tizanidine '4mg'$ .  She said she can call if she needs to speak with her.

## 2022-04-02 NOTE — Progress Notes (Signed)
Subjective:    Patient ID: Carly [name], female    DOB: 1951-08-24, 71 y.o.   MRN: [id number]  HPI  An audio/video tele-health visit is felt to be the most appropriate encounter for this patient at this time. This is a follow up tele-visit via phone. The patient is at home. MD is at office. Prior to scheduling this appointment, our staff discussed the limitations of evaluation and management by telemedicine and the availability of in-person appointments. The patient expressed understanding and agreed to proceed.    Carly [name] is a 71 year old woman who presents for f/u of white matter periventricular CVA, presents for follow-up regarding lymphedema, anxiety,chronic fatigue, insomnia, OSA, and weight gain, and new lower extremity weakness today.    1) Anxiety:  -her psychiatrist previously transitioned her from Seroquel back to benzodiazepine due to side effect profile of Seroquel, but this worsened her anxiety so we restarted it -anxiety has been very severe -she started Zoloft yesterday -she would like referral to Carly [name] who was recommended to her by Carly [name] -she really likes following with Carly [name] as well -she feels that her inability to see her grandkids is a trigger for her anxiety. She used to see them mmore in the past and does not know why she doesn;t get to any mores -she does not want to restart her [name] as she does not want the weight gain associated with this medication -she has only been sleeping 4 hours per night and she feels this worsens her anxiety.  -she has been having a lot of anxiety related to her and her husband's move from their current home into a retirement home and she asks whether she may take the seroquel additionally as needed- once in the morning, once later in the day, and two at night -she is interested in following with a behavioral therapist -she and husband Carly [name] were scared of side effects of Seroquel that were discussed-  she has not noted any this far.  -she has been sleeping but her legs feel very stiff at night -she continues to take Cymbalta '60mg'$  -we tried weaning her off the mirtazepine but she experienced weakness and insomnia and wanted to restart- titrated back up to her former dose.  -she discussed with her husband, who is also present on this call today, and she would prefer to restart Seroquel and stop Lorazepam.  -she has not had any counseling and would like to restart this. She has an appointment with Carly [name] in August and would like to start counseling earlier than this if possible.  -she loved speaking with Carly [name] and would like to follow with him -she had symptoms of dizziness yesterday at the hairdresser and was asked to contact me regarding if this could be from the seroquel. She had had no other similar episodes since starting it.  -she has been sleeping well with the Seroquel at night.  -she would like a list of foods that can help anxiety.  -she asks about the dietary advice I gave her last visit regarding anxiety. -she has been using essential oil.  -anxiety has been bad -off prednisone now -exercise helps her.  -she gets upset over things easily -she wants to be able to calm down.  -she needs refills of her cymbalta and [name]  2) MS: -has been having a lot of stiffness recently -EMS called twice due to difficulty getting her up off the floor -is receiving home therapy -she would like  to see a different neurologist and asked for my recommendation   3) Lymphedema  -has significant bilateral lower extremity  -has greatly improved with lymphedema therapy -she recently received a device that has helped so much that she was able to get off of Lasix.  -has ordered compression garments -does not eat processed foods or salt.  -she has ordered something from [employer] to give her lymphatic massage.  -improving but still very bothersome to her -discussed  bloodwork today  4) HTN: -BP 138/79  5) Decreased appetite:  -resolved with increasing [name]  6) She developed pain in right side, and not sure if this is due to topamax or the antibiotics she is on to treat a respiratory infection.   7) Insomnia:  -sleeping poorly due to her CPAP mask, pressure is [name] at night and this is causing mask leak.  -respiratory therapist will seeing her tomorrow.  -she asks about Inspire.  -she follows with Carly [name] and she says he is also a sleep doctor -she hates to reduce the medicine.  -she has tried 5 different masks -the machine is very loud.  -she can sleep without the CPAP fine.   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 She received a call to schedule an appointment with Carly [name] office and is thankful for the referral.  -she asks what else she can do to help with this -she has been through multiple rounds of antibiotics without benefit.  -also feeling diffuse joint aches.   9) GERD -has been experiencing after eating  10) Sinus congestion -pleased that her CT results were normal.   11) OSA -she notes that she was found to have OSA on recent sleep study and she was tearful about this as she feels it is just another condition she has -she was surprised as she feels that she sleeps well.  -she has note been sleeping well while using the CPAP mask and was told that O2 is leaking -she is working with a respiratory therapist -she feels anxiety about whether she will get enough sleep tonight, she has only been sleeping about 4 hours since using the CPAP -she has decided she does not want to pursue Inspire yet until she has learned more about it.  -she had a very productive visit with Carly [name] and he advised her to try her best to tolerate the CPAP  mask, which she is doing.  -followed up with pulmonology regarding Carly [name] and was told she would be a good candidate for this treatment, but she should prefer to continue trying CPAP mask before trying Inspire  12) Chronic fatigue -she was diagnosed with COVID 9 days ago and her breathing has improved but she has been feeling very fatigued -she has been taking the N-acetyl-cysteine supplement -she asks about taking injetable B12 as this really helped her son -she has not been wearing her CPAP as she is waiting for a new mask  13) Pain -takes Tylenol and baclofen  14) Lower extremity weakness: -improved -went to ED and there was no evidence of stroke. Thought to be due to her Risperdal -her legs feel stiff -no other focal deficits -she started Risperdal on Wednesday night and asks if this could be causing her weakness -Carly [name] has been trying to wean her off Mirtazapine and Seroquel due to weight gain and  onto Risperdal for her anxiety    Pain Inventory Average Pain 5 Pain Right Now 2 My pain is aching and no pain.  Sometimes spasm in legs at night because of MST  In the last 24 hours, has pain interfered with the following? General activity 4 Relation with others 7 Enjoyment of life 5  What TIME of day is your pain at its worst? night Sleep (in general) Fair  Pain is worse with: sitting and unsure Pain improves with: medication Relief from Meds: 5  Family History  Problem Relation Age of Onset   Arthritis Mother    Hypertension Mother    Macular degeneration Mother    Hypertension Father    Hyperlipidemia Father    Heart disease Maternal Grandfather    Diabetes Maternal Grandfather    Kidney disease Paternal Grandmother    Social History   Socioeconomic History   Marital status: Married    Spouse name: Engineer, technical sales   Number of children: 3   Years of education: 14   Highest education level: Associate degree: academic program  Occupational History   Not on file   Tobacco Use   Smoking status: Never   Smokeless tobacco: Never  Vaping Use   Vaping Use: Never used  Substance and Sexual Activity   Alcohol use: No    Alcohol/week: 0.0 standard drinks of alcohol   Drug use: No   Sexual activity: Not Currently  Other Topics Concern   Not on file  Social History Narrative   Lives with husband    Right handed   Caffeine: 1 cup of coffee in the AM, coke occa. Trying to come off caffeine    Social Determinants of Health   Financial Resource Strain: Low Risk  (01/07/2022)   Overall Financial Resource Strain (CARDIA)    Difficulty of Paying Living Expenses: Not hard at all  Food Insecurity: No Food Insecurity (01/07/2022)   Hunger Vital Sign    Worried About Running Out of Food in the Last Year: Never true    Ran Out of Food in the Last Year: Never true  Transportation Needs: No Transportation Needs (01/07/2022)   PRAPARE - Hydrologist (Medical): No    Lack of Transportation (Non-Medical): No  Physical Activity: Insufficiently Active (01/07/2022)   Exercise Vital Sign    Days of Exercise per Week: 2 days    Minutes of Exercise per Session: 60 min  Stress: No Stress Concern Present (01/07/2022)   [place]    Feeling of Stress : Not at all  Social Connections: Unknown (01/07/2022)   Social Connection and Isolation Panel [NHANES]    Frequency of Communication with Friends and Family: Not on file    Frequency of Social Gatherings with Friends and Family: Not on file    Attends Religious Services: Not on file    Active Member of Clubs or Organizations: Not on file    Attends Archivist Meetings: Not on file    Marital Status: Married   Past Surgical History:  Procedure Laterality Date   BACK SURGERY     CHOLECYSTECTOMY     COLONOSCOPY WITH PROPOFOL N/A 05/18/2017   Procedure: COLONOSCOPY WITH PROPOFOL;  Surgeon: [name], MD;   [facility] ENDOSCOPY;  Service: Endoscopy;  Laterality: N/A;   FOOT SURGERY  2015   GALLBLADDER SURGERY  2008   HARDWARE REMOVAL Left 02/14/2016   Procedure: LEFT FOOT REMOVAL DEEP  IMPLANT;  Surgeon: [name], MD;  Location: [facility];  Service: Orthopedics;  Laterality: Left;   HERNIA REPAIR     Inguinal Hernia Repair   SPINE SURGERY  2014   Past Surgical History:  Procedure Laterality Date   BACK SURGERY     CHOLECYSTECTOMY     COLONOSCOPY WITH PROPOFOL N/A 05/18/2017   Procedure: COLONOSCOPY WITH PROPOFOL;  Surgeon: [name], MD;  Location: [facility] ENDOSCOPY;  Service: Endoscopy;  Laterality: N/A;   FOOT SURGERY  2015   GALLBLADDER SURGERY  2008   HARDWARE REMOVAL Left 02/14/2016   Procedure: LEFT FOOT REMOVAL DEEP IMPLANT;  Surgeon: [name], MD;  Location: [facility];  Service: Orthopedics;  Laterality: Left;   HERNIA REPAIR     Inguinal Hernia Repair   SPINE SURGERY  2014   Past Medical History:  Diagnosis Date   Allergy    Anxiety    Aspiration pneumonia (HCC)    Depression    Frequent headaches    H/O   GERD (gastroesophageal reflux disease)    History of chicken pox    History of colon polyps    Hx of migraines    Hypertension    Lymphedema    Multiple sclerosis ([place]) 2011   OSA (obstructive sleep apnea)    PONV (postoperative nausea and vomiting)    Scoliosis    There were no vitals taken for this visit.  Opioid Risk Score:   Fall Risk Score:  `1  Depression screen PHQ 2/9     01/07/2022    3:15 PM 11/05/2021   10:10 AM 08/27/2021   11:12 AM 08/15/2021   10:50 AM 07/19/2021    3:28 PM 06/18/2021    9:24 AM 05/31/2021    1:47 PM  Depression screen PHQ 2/9  Decreased Interest 0 0 2 0 '1 3 3  '$ Down, Depressed, Hopeless 0 1 2 0 '1 3 3  '$ PHQ - 2 Score 0 1 4 0 '2 6 6    '$ Review of Systems  Constitutional: Negative.   HENT: Negative.    Eyes: Negative.   Respiratory:  Negative for cough.        Chest congestion   Cardiovascular: Negative.   Gastrointestinal: Negative.   Endocrine: Negative.   Genitourinary: Negative.   Musculoskeletal:  Positive for back pain and gait problem.  Skin: Negative.   Allergic/Immunologic: Negative.   Hematological:  Bruises/bleeds easily.       Plavix  Psychiatric/Behavioral:  Positive for dysphoric mood.   All other systems reviewed and are negative.      Objective:   Physical Exam Patient seen via phone     Assessment & Plan:  1) Anxiety: -discussed with patient the side effects of both Seroquel and Lorazepam, as well as potential interactions with other medications. -discussed referral to psychiatry, placed referral to Carly [name] who was recommended to her by Carly [name] -continue Zoloft, discussed that this will take 2-4 weeks to have effect -discussed benefits of exercise and good nutrition -discussed her move because it is stressing her.  -discussed whether we should increase Seroquel due to her anxiety. Discussed that she is currently taking 12.'5mg'$  3 times per day PRN -discussed about her stress of seeing many different doctors -advised to always use the lowest dose of medications that she needs to minimize side effects -discussed the buspar that was started for her by Dr. Kerman Passey- she has not yet noted benefits from this, advised that she does  not need to take it if she does not feel benefits.  -referred to psychiatry in [place] for CBT -reinforced importance of thinking positively.  -continue to encourage non-pharmacologic management approaches as well, such as meditation, applying lavender oil, and exercise- all of which she has been trying and which have been helping.  -discussed alternative anxiety medications.  -continue seroquel 12.'5mg'$  daily and '25mg'$  at night. Can decreased to '25mg'$  at night and assess positive/negative effects during the day. Can restart 1/2 tab during the day if anxiety is uncontrolled.  -discontinue lorazepam.   -establish care with Carly [name] for neuropsych counseling on 8/2. They really valued the appointment with him.  -discontinue topamax in case pain in right side is due to medication- discussed can also be due to antibiotics which she is taking for respiratory infection- discontinuing the topamax will help [place] know if it is the cause of her pain or not. -refilled [name] '30mg'$  HS.  -refilled Cymbalta '60mg'$  -Discussed exercise and meditation as tools to decrease anxiety. -Recommended Down Dog Yoga app -Discussed spending time outdoors. -Discussed positive re-framing of anxiety.  -Discussed the following foods that have been show to reduce anxiety: 1) [place] nuts, mushrooms, soy beans due to their high selenium content. Upper limit of toxicity of selenium is 455mg/day so no more than 3-4 bBolivianuts per day.  2) Fatty fish such as salmon, mackerel, sardines, trout, and herring- high in omega-3 fatty acids 3) Eggs- increases serotonin and dopamine 4) Pumpkin seeds- high in omega-3 fatty acids 5) dark chocolate- high in flavanols that increase blood flow to brain 6) turmeric- take with black pepper to increase absorption 7) chamomile tea- antioxidant and anti-inflammatory properties 8) yogurt without sugar- supports gut-brain axis 9) green tea- contains L- theanine 10) blueberries- high in vitamin C and antioxidants 11) t[place] high in tryptophan which gets converted to serotonin 12) bell peppers- rich in vitamin C and antioxidants 13) citrus fruits- rich in vitamin C and antioxidants 14) almonds- high in vitamin E and healthy fats 15) chia seeds- high in omega-3 fatty acids  2) MS -continue home therapy to minimize stiffness/maximize mobility -f/u with MS specialist. Discussed her appointmenr with Dr. SKerman Passey  -provided with neurology referral -discussed trying lipoic acid  3) HTN:  -discussed good blood pressure today.  BP has been 130s-150s/80s, recommended citrus foods and nuts,  as much mobility as she can tolerate, log Bps daily and bring log to f/u appointment.  -continue current regimen and checking and checking BP.  -advised citrus foods and nuts to help lower BP. Discussed that once BP is consistently 120/80 we can wean her Losartan to '25mg'$ .   4) Bilateral lower extremity edema: - continue lymphedema therapy which is greatly helping.  -Lasix discontinued. Discussed that this should also improve her kidney function -advised low salt diet.  -prescribed new compression garments, will fax to company.  -discussed doing the compression garments early in the morning at 9am  -discussed that her kidney function is normal on last check in March.   5) Insomnia: -recommended tart cherry juice with dinner, valerian root and chamomile teas in evening, applying lavender drops to forehead at night. -continue [name] '30mg'$  HS- discussed that we will not try to wean off again since we saw how much this is benefittng her -discontinue amantadine.  -discussed considering the Inspire trial since he has tried the CPAP for a long time now and can not tolerate it.   6) Obesity BMI 33.13 -attempted to wean off [name] but  she developed worsening insomnia, anxiety, and decreased appetite that was undesirable to her, so have restarted the medication.  -discussed trial of avoiding gluten for 3 months and how this could potentially help with her weight loss, anxiety, and prevention/slowing of her chronic diseases. -discussed benefits of intermittent fasting- made goal to set a consistent dinner time and avoid snacking after this time. If she is hungry, choose a healthy snack of nuts, fruit, or vegetables.  -provided a link to a pdf of Pete Escogue's musculoskeletal alignment exercises: [email or web address]   7) Chest congestion -referred for pulmonary consult with Carly [name], who treated her father for pulmonary fibrosis.  She has made an appointment to establish care with him.  -recommended drinking daily warm water with honey, lime and ginger -recommended drinking tea with holy Basil (Tulsi)  8) Sore throat: -continue salt water gargling  9) Sinus congestion: -recommended humidifier -drink 6-8 glasses of water per day.   10) fatigue -checked B12, folate, TSH, vitamin D, magnesium and discussed bloodwork with patient and husband: all in normal range except for suboptimal Vitamin D- high dose supplement prescribed -Recommended continuing NAC (N-Acetyl cysteine) '600mg'$  BID for her fatigue. Discussed its benefits in boosting glutathione and mitochondrial function. -prescribed monthly injectable B12 as patient would like to try this. Discussed that there are few risks to having elevated B12 levels -discussed that not wearing her CPAP is also likely contributing to her fatigue -discussed modafinil but she prefers to try the B12 first   11) OSA -discussed that OSA can contribute to daytime fatigue, weight gain, anxiety, and chronic conditions so it is good to get this condition treated. -referred to Dr. Melida Quitter for eval for [facility] given that she is having a hard time sleeping with her CPAP. Discussed her decision not to pursue Inspire at this time and instead try to tolerate her current mask as best as possible. Discussed her positive conversation with Carly [name]. -advised her to talk to her respiratory therapist about fitting another mask -discussed Inspire but she says unfortunately it is not covered by insurance.  -discussed strap under the chin.  -discussed following up with pulmonology for Inspire.  -agreed with her decision to continue to try CPAP before pursuing Inspire due to her fears of the procedure  12) Low back pain: -recommended doTerra Deep Blue Essential oil and applied to area of pain today- discussed that this is made of natural plant oils. Shared by personal experience of benefit from  use of this essential oil -provided a link to a pdf of Pete Escogue's musculoskeletal alignment exercises: [email or web address]   13) Lower extremity weakness: -discussed that this could be a possible side effect of the Risperdal.  -Recommended calling Dr. Garth Bigness office to let him know about the lower extremity weakness -discussed that she should go to the ED as unilateral lower extremity weakness could be a symptom of stroke or MS flare -discussed stopping Risperdal until she can talk with Carly [name] -Discussed using Seroquel prn for anxiety in the meantime  14) Depression -prescribed Zoloft which she has had good response to in the past.  -encouraged that she call her kids to let them know how important it is to her to be able to see her grandchildren  12 minutes spent in discussing her depression and anxiety, discussing that I will put in referral for her to see Carly [name] who was recommended to her by Carly [name], recommending her to call her children to let her know she would like  to see her grandkids

## 2022-04-02 NOTE — Telephone Encounter (Signed)
Will hold until receive request.

## 2022-04-02 NOTE — Progress Notes (Signed)
Subjective:    Patient ID: Carly [name], female    DOB: 09/30/1951, 71 y.o.   MRN: [id number]  HPI  An audio/video tele-health visit is felt to be the most appropriate encounter for this patient at this time. This is a follow up tele-visit via phone. The patient is at home. MD is at office. Prior to scheduling this appointment, our staff discussed the limitations of evaluation and management by telemedicine and the availability of in-person appointments. The patient expressed understanding and agreed to proceed.    Carly [name] is a 71 year old woman who presents for f/u of white matter periventricular CVA, presents for follow-up regarding lymphedema, anxiety,chronic fatigue, insomnia, OSA, and weight gain, and new lower extremity weakness today.    1) Anxiety:  -her psychiatrist previously transitioned her from Seroquel back to benzodiazepine due to side effect profile of Seroquel, but this worsened her anxiety so we restarted it -anxiety has been very severe -she does not want to restart her [name] as she does not want the weight gain associated with this medication -she has only been sleeping 4 hours per night and she feels this worsens her anxiety.  -she has been having a lot of anxiety related to her and her husband's move from their current home into a retirement home and she asks whether she may take the seroquel additionally as needed- once in the morning, once later in the day, and two at night -she is interested in following with a behavioral therapist -she and husband Carly [name] were scared of side effects of Seroquel that were discussed- she has not noted any this far.  -she has been sleeping but her legs feel very stiff at night -she continues to take Cymbalta '60mg'$  -we tried weaning her off the mirtazepine but she experienced weakness and insomnia and wanted to restart- titrated back up to her former dose.  -she discussed with her husband, who is also present on this call  today, and she would prefer to restart Seroquel and stop Lorazepam.  -she has not had any counseling and would like to restart this. She has an appointment with [name] in August and would like to start counseling earlier than this if possible.  -she loved speaking with [name] inpatient and would like to follow with him -she had symptoms of dizziness yesterday at the hairdresser and was asked to contact me regarding if this could be from the seroquel. She had had no other similar episodes since starting it.  -she has been sleeping well with the Seroquel at night.  -she would like a list of foods that can help anxiety.  -she asks about the dietary advice I gave her last visit regarding anxiety. -she has been using essential oil.  -anxiety has been bad -off prednisone now -exercise helps her.  -she gets upset over things easily -she wants to be able to calm down.  -she needs refills of her cymbalta and [name]  2) MS: -has been having a lot of stiffness recently -EMS called twice due to difficulty getting her up off the floor -is receiving home therapy -she would like to see a different neurologist and asked for my recommendation   3) Lymphedema  -has significant bilateral lower extremity  -has greatly improved with lymphedema therapy -she recently received a device that has helped so much that she was able to get off of Lasix.  -has ordered compression garments -does not eat processed foods or salt.  -she has ordered something  from [place] to give her lymphatic massage.  -improving but still very bothersome to her -discussed bloodwork today  4) HTN: -BP 138/79  5) Decreased appetite:  -resolved with increasing [name]  6) She developed pain in right side, and not sure if this is due to topamax or the antibiotics she is on to treat a respiratory infection.   7) Insomnia:  -sleeping poorly due to her CPAP mask, pressure is rising at night and this is causing  mask leak.  -respiratory therapist will seeing her tomorrow.  -she asks about Inspire.  -she follows with [name] and she says he is also a sleep doctor -she hates to reduce the medicine.  -she has tried 5 different masks -the machine is very loud.  -she can sleep without the CPAP fine.   8) Chest congestion -she has been having symptoms of congestion for greater than 3 months -Her father saw 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 She received a call to schedule an appointment with [name] office and is thankful for the referral.  -she asks what else she can do to help with this -she has been through multiple rounds of antibiotics without benefit.  -also feeling diffuse joint aches.   9) GERD -has been experiencing after eating  10) Sinus congestion -pleased that her CT results were normal.   11) OSA -she notes that she was found to have OSA on recent sleep study and she was tearful about this as she feels it is just another condition she has -she was surprised as she feels that she sleeps well.  -she has note been sleeping well while using the CPAP mask and was told that O2 is leaking -she is working with a respiratory therapist -she feels anxiety about whether she will get enough sleep tonight, she has only been sleeping about 4 hours since using the CPAP -she has decided she does not want to pursue Inspire yet until she has learned more about it.  -she had a very productive visit with [name] and he advised her to try her best to tolerate the CPAP mask, which she is doing.  -followed up with pulmonology regarding [name] and was told she would be a good candidate for this treatment, but she should prefer to continue trying CPAP mask before trying Inspire  12) Chronic fatigue -she was diagnosed with COVID 9 days ago and her breathing has improved but she has been feeling very fatigued -she has  been taking the N-acetyl-cysteine supplement -she asks about taking injetable B12 as this really helped her son -she has not been wearing her CPAP as she is waiting for a new mask  13) Pain -takes Tylenol and baclofen  14) Lower extremity weakness: -improved -went to ED and there was no evidence of stroke. Thought to be due to her Risperdal -her legs feel stiff -no other focal deficits -she started Risperdal on Wednesday night and asks if this could be causing her weakness -[name] has been trying to wean her off Mirtazapine and Seroquel due to weight gain and onto Risperdal for her anxiety    Pain Inventory Average Pain 5 Pain Right Now 2 My pain is aching and no pain.  Sometimes spasm in legs at night because of MST  In the last 24 hours, has pain interfered with the following? General activity 4 Relation with others 7 Enjoyment of life 5  What TIME of day is  your pain at its worst? night Sleep (in general) Fair  Pain is worse with: sitting and unsure Pain improves with: medication Relief from Meds: 5  Family History  Problem Relation Age of Onset   Arthritis Mother    Hypertension Mother    Macular degeneration Mother    Hypertension Father    Hyperlipidemia Father    Heart disease Maternal Grandfather    Diabetes Maternal Grandfather    Kidney disease Paternal Grandmother    Social History   Socioeconomic History   Marital status: Married    Spouse name: Engineer, technical sales   Number of children: 3   Years of education: 14   Highest education level: Associate degree: academic program  Occupational History   Not on file  Tobacco Use   Smoking status: Never   Smokeless tobacco: Never  Vaping Use   Vaping Use: Never used  Substance and Sexual Activity   Alcohol use: No    Alcohol/week: 0.0 standard drinks of alcohol   Drug use: No   Sexual activity: Not Currently  Other Topics Concern   Not on file  Social History Narrative   Lives with husband    Right handed    Caffeine: 1 cup of coffee in the AM, coke occa. Trying to come off caffeine    Social Determinants of Health   Financial Resource Strain: Low Risk  (01/07/2022)   Overall Financial Resource Strain (CARDIA)    Difficulty of Paying Living Expenses: Not hard at all  Food Insecurity: No Food Insecurity (01/07/2022)   Hunger Vital Sign    Worried About Running Out of Food in the Last Year: Never true    Ran Out of Food in the Last Year: Never true  Transportation Needs: No Transportation Needs (01/07/2022)   PRAPARE - Hydrologist (Medical): No    Lack of Transportation (Non-Medical): No  Physical Activity: Insufficiently Active (01/07/2022)   Exercise Vital Sign    Days of Exercise per Week: 2 days    Minutes of Exercise per Session: 60 min  Stress: No Stress Concern Present (01/07/2022)   [place]    Feeling of Stress : Not at all  Social Connections: Unknown (01/07/2022)   Social Connection and Isolation Panel [NHANES]    Frequency of Communication with Friends and Family: Not on file    Frequency of Social Gatherings with Friends and Family: Not on file    Attends Religious Services: Not on file    Active Member of Clubs or Organizations: Not on file    Attends Archivist Meetings: Not on file    Marital Status: Married   Past Surgical History:  Procedure Laterality Date   BACK SURGERY     CHOLECYSTECTOMY     COLONOSCOPY WITH PROPOFOL N/A 05/18/2017   Procedure: COLONOSCOPY WITH PROPOFOL;  Surgeon: [name], MD;  Location: [facility] ENDOSCOPY;  Service: Endoscopy;  Laterality: N/A;   FOOT SURGERY  2015   GALLBLADDER SURGERY  2008   HARDWARE REMOVAL Left 02/14/2016   Procedure: LEFT FOOT REMOVAL DEEP IMPLANT;  Surgeon: [name], MD;  Location: [facility];  Service: Orthopedics;  Laterality: Left;   HERNIA REPAIR     Inguinal Hernia Repair   SPINE SURGERY   2014   Past Surgical History:  Procedure Laterality Date   BACK SURGERY     CHOLECYSTECTOMY     COLONOSCOPY WITH PROPOFOL N/A 05/18/2017  Procedure: COLONOSCOPY WITH PROPOFOL;  Surgeon: [name], MD;  Location: [facility] ENDOSCOPY;  Service: Endoscopy;  Laterality: N/A;   FOOT SURGERY  2015   GALLBLADDER SURGERY  2008   HARDWARE REMOVAL Left 02/14/2016   Procedure: LEFT FOOT REMOVAL DEEP IMPLANT;  Surgeon: [name], MD;  Location: [place];  Service: Orthopedics;  Laterality: Left;   HERNIA REPAIR     Inguinal Hernia Repair   SPINE SURGERY  2014   Past Medical History:  Diagnosis Date   Allergy    Anxiety    Aspiration pneumonia (HCC)    Depression    Frequent headaches    H/O   GERD (gastroesophageal reflux disease)    History of chicken pox    History of colon polyps    Hx of migraines    Hypertension    Lymphedema    Multiple sclerosis ([place]) 2011   OSA (obstructive sleep apnea)    PONV (postoperative nausea and vomiting)    Scoliosis    There were no vitals taken for this visit.  Opioid Risk Score:   Fall Risk Score:  `1  Depression screen PHQ 2/9     01/07/2022    3:15 PM 11/05/2021   10:10 AM 08/27/2021   11:12 AM 08/15/2021   10:50 AM 07/19/2021    3:28 PM 06/18/2021    9:24 AM 05/31/2021    1:47 PM  Depression screen PHQ 2/9  Decreased Interest 0 0 2 0 '1 3 3  '$ Down, Depressed, Hopeless 0 1 2 0 '1 3 3  '$ PHQ - 2 Score 0 1 4 0 '2 6 6    '$ Review of Systems  Constitutional: Negative.   HENT: Negative.    Eyes: Negative.   Respiratory:  Negative for cough.        Chest congestion  Cardiovascular: Negative.   Gastrointestinal: Negative.   Endocrine: Negative.   Genitourinary: Negative.   Musculoskeletal:  Positive for back pain and gait problem.  Skin: Negative.   Allergic/Immunologic: Negative.   Hematological:  Bruises/bleeds easily.       Plavix  Psychiatric/Behavioral:  Positive for dysphoric mood.   All other systems reviewed and  are negative.      Objective:   Physical Exam Patient seen via phone     Assessment & Plan:  1) Anxiety: -discussed with patient the side effects of both Seroquel and Lorazepam, as well as potential interactions with other medications. -discussed referral to psychiatry -discussed benefits of exercise and good nutrition -discussed her move because it is stressing her.  -discussed whether we should increase Seroquel due to her anxiety. Discussed that she is currently taking 12.'5mg'$  3 times per day PRN -discussed about her stress of seeing many different doctors -advised to always use the lowest dose of medications that she needs to minimize side effects -discussed the buspar that was started for her by Dr. Kerman Passey- she has not yet noted benefits from this, advised that she does not need to take it if she does not feel benefits.  -referred to psychiatry in [facility] for [facility]BT -reinforced importance of thinking positively.  -continue to encourage non-pharmacologic management approaches as well, such as meditation, applying lavender oil, and exercise- all of which she has been trying and which have been helping.  -discussed alternative anxiety medications.  -continue seroquel 12.'5mg'$  daily and '25mg'$  at night. Can decreased to '25mg'$  at night and assess positive/negative effects during the day. Can restart 1/2 tab during the day if anxiety  is uncontrolled.  -discontinue lorazepam.  -establish care with [name] for neuropsych counseling on 8/2. They really valued the appointment with him.  -discontinue topamax in case pain in right side is due to medication- discussed can also be due to antibiotics which she is taking for respiratory infection- discontinuing the topamax will help [place] know if it is the cause of her pain or not. -refilled [name] '30mg'$  HS.  -refilled Cymbalta '60mg'$  -Discussed exercise and meditation as tools to decrease anxiety. -Recommended Down Dog Yoga app -Discussed  spending time outdoors. -Discussed positive re-framing of anxiety.  -Discussed the following foods that have been show to reduce anxiety: 1) [place] nuts, mushrooms, soy beans due to their high selenium content. Upper limit of toxicity of selenium is 464mg/day so no more than 3-4 bBolivianuts per day.  2) Fatty fish such as salmon, mackerel, sardines, trout, and herring- high in omega-3 fatty acids 3) Eggs- increases serotonin and dopamine 4) Pumpkin seeds- high in omega-3 fatty acids 5) dark chocolate- high in flavanols that increase blood flow to brain 6) turmeric- take with black pepper to increase absorption 7) chamomile tea- antioxidant and anti-inflammatory properties 8) yogurt without sugar- supports gut-brain axis 9) green tea- contains L- theanine 10) blueberries- high in vitamin C and antioxidants 11) [place] high in tryptophan which gets converted to serotonin 12) bell peppers- rich in vitamin C and antioxidants 13) citrus fruits- rich in vitamin C and antioxidants 14) almonds- high in vitamin E and healthy fats 15) chia seeds- high in omega-3 fatty acids  2) MS -continue home therapy to minimize stiffness/maximize mobility -f/u with MS specialist. Discussed her appointmenr with Dr. SKerman Passey  -provided with neurology referral -discussed trying lipoic acid  3) HTN:  -discussed good blood pressure today.  BP has been 130s-150s/80s, recommended citrus foods and nuts, as much mobility as she can tolerate, log Bps daily and bring log to f/u appointment.  -continue current regimen and checking and checking BP.  -advised citrus foods and nuts to help lower BP. Discussed that once BP is consistently 120/80 we can wean her Losartan to '25mg'$ .   4) Bilateral lower extremity edema: - continue lymphedema therapy which is greatly helping.  -Lasix discontinued. Discussed that this should also improve her kidney function -advised low salt diet.  -prescribed new compression garments, will  fax to company.  -discussed doing the compression garments early in the morning at 9am  -discussed that her kidney function is normal on last check in March.   5) Insomnia: -recommended tart cherry juice with dinner, valerian root and chamomile teas in evening, applying lavender drops to forehead at night. -continue [name] '30mg'$  HS- discussed that we will not try to wean off again since we saw how much this is benefittng her -discontinue amantadine.  -discussed considering the Inspire trial since he has tried the CPAP for a long time now and can not tolerate it.   6) Obesity BMI 33.13 -attempted to wean off [name] but she developed worsening insomnia, anxiety, and decreased appetite that was undesirable to her, so have restarted the medication.  -discussed trial of avoiding gluten for 3 months and how this could potentially help with her weight loss, anxiety, and prevention/slowing of her chronic diseases. -discussed benefits of intermittent fasting- made goal to set a consistent dinner time and avoid snacking after this time. If she is hungry, choose a healthy snack of nuts, fruit, or vegetables.  -provided a link to a pdf of Pete Escogue's musculoskeletal alignment exercises:  [email or web address]   7) Chest congestion -referred for pulmonary consult with [name], who treated her father for pulmonary fibrosis. She has made an appointment to establish care with him.  -recommended drinking daily warm water with honey, lime and ginger -recommended drinking tea with holy Basil (Tulsi)  8) Sore throat: -continue salt water gargling  9) Sinus congestion: -recommended humidifier -drink 6-8 glasses of water per day.   10) fatigue -checked B12, folate, TSH, vitamin D, magnesium and discussed bloodwork with patient and husband: all in normal range except for suboptimal Vitamin D- high dose supplement  prescribed -Recommended continuing NAC (N-Acetyl cysteine) '600mg'$  BID for her fatigue. Discussed its benefits in boosting glutathione and mitochondrial function. -prescribed monthly injectable B12 as patient would like to try this. Discussed that there are few risks to having elevated B12 levels -discussed that not wearing her CPAP is also likely contributing to her fatigue -discussed modafinil but she prefers to try the B12 first   11) OSA -discussed that OSA can contribute to daytime fatigue, weight gain, anxiety, and chronic conditions so it is good to get this condition treated. -referred to Dr. Melida Quitter for eval for [facility] given that she is having a hard time sleeping with her CPAP. Discussed her decision not to pursue Inspire at this time and instead try to tolerate her current mask as best as possible. Discussed her positive conversation with [name]. -advised her to talk to her respiratory therapist about fitting another mask -discussed Inspire but she says unfortunately it is not covered by insurance.  -discussed strap under the chin.  -discussed following up with pulmonology for Inspire.  -agreed with her decision to continue to try CPAP before pursuing Inspire due to her fears of the procedure  12) Low back pain: -recommended doTerra Deep Blue Essential oil and applied to area of pain today- discussed that this is made of natural plant oils. Shared by personal experience of benefit from use of this essential oil -provided a link to a pdf of Pete Escogue's musculoskeletal alignment exercises: [email or web address]   13) Lower extremity weakness: -discussed that this could be a possible side effect of the Risperdal.  -Recommended calling Dr. Garth Bigness office to let him know about the lower extremity weakness -discussed that she should go to the ED as unilateral lower extremity weakness could be a symptom of stroke or MS  flare -discussed stopping Risperdal until she can talk with [name] -Discussed using Seroquel prn for anxiety in the meantime  14) Depression -prescribed Zoloft which she has had good response to in the past.   15 minutes spent in discussing her depression and anxiety, discussing the benefits of exercise and nutrition in improving both of these conditions, discussing restarting Zoloft which helped her in the past

## 2022-04-03 ENCOUNTER — Encounter: Payer: Medicare Other | Admitting: Dermatology

## 2022-04-03 ENCOUNTER — Telehealth: Payer: Self-pay | Admitting: Neurology

## 2022-04-03 DIAGNOSIS — I69398 Other sequelae of cerebral infarction: Secondary | ICD-10-CM | POA: Diagnosis not present

## 2022-04-03 DIAGNOSIS — G35 Multiple sclerosis: Secondary | ICD-10-CM | POA: Diagnosis not present

## 2022-04-03 DIAGNOSIS — R262 Difficulty in walking, not elsewhere classified: Secondary | ICD-10-CM | POA: Diagnosis not present

## 2022-04-03 DIAGNOSIS — R2681 Unsteadiness on feet: Secondary | ICD-10-CM | POA: Diagnosis not present

## 2022-04-03 DIAGNOSIS — F418 Other specified anxiety disorders: Secondary | ICD-10-CM

## 2022-04-03 DIAGNOSIS — R252 Cramp and spasm: Secondary | ICD-10-CM

## 2022-04-03 DIAGNOSIS — M6281 Muscle weakness (generalized): Secondary | ICD-10-CM | POA: Diagnosis not present

## 2022-04-03 DIAGNOSIS — G4700 Insomnia, unspecified: Secondary | ICD-10-CM

## 2022-04-03 DIAGNOSIS — R609 Edema, unspecified: Secondary | ICD-10-CM

## 2022-04-03 DIAGNOSIS — Z7952 Long term (current) use of systemic steroids: Secondary | ICD-10-CM

## 2022-04-03 DIAGNOSIS — R269 Unspecified abnormalities of gait and mobility: Secondary | ICD-10-CM

## 2022-04-03 DIAGNOSIS — R279 Unspecified lack of coordination: Secondary | ICD-10-CM | POA: Diagnosis not present

## 2022-04-03 DIAGNOSIS — F419 Anxiety disorder, unspecified: Secondary | ICD-10-CM

## 2022-04-03 NOTE — Telephone Encounter (Signed)
Per previous phone notes from 03/31/22:  [name]: "I do not recommend any further changes in medication at this point.  We have tried a couple different regimen and nothing seems to have helped her better.  I do recommend that she see psychiatry.  She recently saw her psychologist.  If he has not referred her we could refer to psychiatry for extreme anxiety."  I called pt back on 03/31/22 relaying above message: "Called husband back. Relayed Dr. Garth Bigness message. They are agreeable to referral to psychiatry. They are going to contact [name] first. He mentioned someone in particular for a recommendation. They will then call us back."

## 2022-04-03 NOTE — Telephone Encounter (Signed)
Called back and spoke w/ Carly [name] (caregiver) and pt. Reminded them about conversation we had on 03/31/22. They spoke with [name] who referred to Dr. Adele Schilder but he declined referral stating he is not seeing anyone in her age group right now. Would like referral placed for:  [name], MD [place], [place] [address], [place], [place] [address], [place] Phone: [phone]  FAX: [phone]  I placed referral. They will have her see psychiatry first for evaluation/recommendation.

## 2022-04-03 NOTE — Telephone Encounter (Signed)
Pt is wanting the RN to call her back to discuss increasing the dosage on her Baclofen 5 MG TABS due to the pain in her legs. Please advise.

## 2022-04-04 ENCOUNTER — Encounter (HOSPITAL_BASED_OUTPATIENT_CLINIC_OR_DEPARTMENT_OTHER): Payer: Medicare Other | Admitting: Physical Medicine and Rehabilitation

## 2022-04-04 DIAGNOSIS — R252 Cramp and spasm: Secondary | ICD-10-CM | POA: Diagnosis not present

## 2022-04-04 DIAGNOSIS — F411 Generalized anxiety disorder: Secondary | ICD-10-CM

## 2022-04-05 NOTE — Progress Notes (Signed)
Subjective:    Patient ID: Carly [name], female    DOB: 04/23/1951, 71 y.o.   MRN: [id number]  HPI  An audio/video tele-health visit is felt to be the most appropriate encounter for this patient at this time. This is a follow up tele-visit via phone. The patient is at home. MD is at office. Prior to scheduling this appointment, our staff discussed the limitations of evaluation and management by telemedicine and the availability of in-person appointments. The patient expressed understanding and agreed to proceed.    Carly [name] is a 71 year old woman who presents for f/u of white matter periventricular CVA, presents for follow-up regarding lymphedema, anxiety,chronic fatigue, insomnia, OSA, and weight gain, and new lower extremity weakness today.    1) Anxiety:  -her psychiatrist previously transitioned her from Seroquel back to benzodiazepine due to side effect profile of Seroquel, but this worsened her anxiety so we restarted it -anxiety has been very severe -she started Zoloft this week and has not yet noted any benefit -she would like referral to Dr. Garnette Scheuermann who was recommended to her by Dr. Kieth Brightly, she has not yet heard from their office -she really likes following with Dr. Kieth Brightly as well -she feels that her inability to see her grandkids is a trigger for her anxiety. She used to see them mmore in the past and does not know why she doesn;t get to any mores -she does not want to restart her [name] as she does not want the weight gain associated with this medication -she has only been sleeping 4 hours per night and she feels this worsens her anxiety.  -she has been having a lot of anxiety related to her and her husband's move from their current home into a retirement home and she asks whether she may take the seroquel additionally as needed- once in the morning, once later in the day, and two at night -she is interested in following with a behavioral therapist -she and  husband Carly [name] were scared of side effects of Seroquel that were discussed- she has not noted any this far.  -she has been sleeping but her legs feel very stiff at night -she continues to take Cymbalta 60mg  -we tried weaning her off the mirtazepine but she experienced weakness and insomnia and wanted to restart- titrated back up to her former dose.  -she discussed with her husband, who is also present on this call today, and she would prefer to restart Seroquel and stop Lorazepam.  -she has not had any counseling and would like to restart this. She has an appointment with Dr. Kieth Brightly in August and would like to start counseling earlier than this if possible.  -she loved speaking with Dr. Kieth Brightly inpatient and would like to follow with him -she had symptoms of dizziness yesterday at the hairdresser and was asked to contact me regarding if this could be from the seroquel. She had had no other similar episodes since starting it.  -she has been sleeping well with the Seroquel at night.  -she would like a list of foods that can help anxiety.  -she asks about the dietary advice I gave her last visit regarding anxiety. -she has been using essential oil.  -anxiety has been bad -off prednisone now -exercise helps her.  -she gets upset over things easily -she wants to be able to calm down.  -she needs refills of her cymbalta and [name]  2) MS: -has been having a lot of stiffness recently -EMS called twice  due to difficulty getting her up off the floor -is receiving home therapy -she would like to see a different neurologist and asked for my recommendation   3) Lymphedema  -has significant bilateral lower extremity  -has greatly improved with lymphedema therapy -she recently received a device that has helped so much that she was able to get off of Lasix.  -has ordered compression garments -does not eat processed foods or salt.  -she has ordered something from [employer] to give her lymphatic  massage.  -improving but still very bothersome to her -discussed bloodwork today  4) HTN: -BP 138/79  5) Decreased appetite:  -resolved with increasing [name]  6) She developed pain in right side, and not sure if this is due to topamax or the antibiotics she is on to treat a respiratory infection.   7) Insomnia:  -sleeping poorly due to her CPAP mask, pressure is rising at night and this is causing mask leak.  -respiratory therapist will seeing her tomorrow.  -she asks about Inspire.  -she follows with Dr. Epimenio Foot and she says he is also a sleep doctor -she hates to reduce the medicine.  -she has tried 5 different masks -the machine is very loud.  -she can sleep without the CPAP fine.   8) Chest congestion -she has been having symptoms of congestion for greater than 3 months -Her father saw Dr. Fulton Reek for pulmonary fibrosis that it was suspected he developed while working in tobacco fields. She received a call to schedule an appointment with Dr. Lillette Boxer office and is thankful for the referral.  -she asks what else she can do to help with this -she has been through multiple rounds of antibiotics without benefit.  -also feeling diffuse joint aches.   9) GERD -has been experiencing after eating  10) Sinus congestion -pleased that her CT results were normal.   11) OSA -she notes that she was found to have OSA on recent sleep study and she was tearful about this as she feels it is just another condition she has -she was surprised as she feels that she sleeps well.  -she has note been sleeping well while using the CPAP mask and was told that O2 is leaking -she is working with a respiratory therapist -she feels anxiety about whether she will get enough sleep tonight, she has only been sleeping about 4 hours since using the CPAP -she has decided she does not want to pursue Inspire yet until she has learned more about it.  -she had a very productive visit with Dr.  Kieth Brightly and he advised her to try her best to tolerate the CPAP mask, which she is doing.  -followed up with pulmonology regarding Earnest Bailey and was told she would be a good candidate for this treatment, but she should prefer to continue trying CPAP mask before trying Inspire  12) Chronic fatigue -she was diagnosed with COVID 9 days ago and her breathing has improved but she has been feeling very fatigued -she has been taking the N-acetyl-cysteine supplement -she asks about taking injetable B12 as this really helped her son -she has not been wearing her CPAP as she is waiting for a new mask  13) Pain -takes Tylenol and baclofen  14) Lower extremity weakness: -improved -went to ED and there was no evidence of stroke. Thought to be due to her Risperdal -her legs feel stiff -no other focal deficits -she started Risperdal on Wednesday night and asks if this could be causing her weakness -Dr.  Sater has been trying to wean her off Mirtazapine and Seroquel due to weight gain and onto Risperdal for her anxiety  15) Bilateral lower extremity pain -feels stiff and tight like spasticity -she asks if she can take extra Baclofen    Pain Inventory Average Pain 5 Pain Right Now 2 My pain is aching and no pain.  Sometimes spasm in legs at night because of MST  In the last 24 hours, has pain interfered with the following? General activity 4 Relation with others 7 Enjoyment of life 5  What TIME of day is your pain at its worst? night Sleep (in general) Fair  Pain is worse with: sitting and unsure Pain improves with: medication Relief from Meds: 5  Family History  Problem Relation Age of Onset   Arthritis Mother    Hypertension Mother    Macular degeneration Mother    Hypertension Father    Hyperlipidemia Father    Heart disease Maternal Grandfather    Diabetes Maternal Grandfather    Kidney disease Paternal Grandmother    Social History   Socioeconomic History   Marital  status: Married    Spouse name: Optometrist   Number of children: 3   Years of education: 14   Highest education level: Associate degree: academic program  Occupational History   Not on file  Tobacco Use   Smoking status: Never   Smokeless tobacco: Never  Vaping Use   Vaping Use: Never used  Substance and Sexual Activity   Alcohol use: No    Alcohol/week: 0.0 standard drinks of alcohol   Drug use: No   Sexual activity: Not Currently  Other Topics Concern   Not on file  Social History Narrative   Lives with husband    Right handed   Caffeine: 1 cup of coffee in the AM, coke occa. Trying to come off caffeine    Social Determinants of Health   Financial Resource Strain: Low Risk  (01/07/2022)   Overall Financial Resource Strain (CARDIA)    Difficulty of Paying Living Expenses: Not hard at all  Food Insecurity: No Food Insecurity (01/07/2022)   Hunger Vital Sign    Worried About Running Out of Food in the Last Year: Never true    Ran Out of Food in the Last Year: Never true  Transportation Needs: No Transportation Needs (01/07/2022)   PRAPARE - Administrator, Civil Service (Medical): No    Lack of Transportation (Non-Medical): No  Physical Activity: Insufficiently Active (01/07/2022)   Exercise Vital Sign    Days of Exercise per Week: 2 days    Minutes of Exercise per Session: 60 min  Stress: No Stress Concern Present (01/07/2022)   [employer] of Occupational Health - Occupational Stress Questionnaire    Feeling of Stress : Not at all  Social Connections: Unknown (01/07/2022)   Social Connection and Isolation Panel [NHANES]    Frequency of Communication with Friends and Family: Not on file    Frequency of Social Gatherings with Friends and Family: Not on file    Attends Religious Services: Not on file    Active Member of Clubs or Organizations: Not on file    Attends Banker Meetings: Not on file    Marital Status: Married   Past Surgical History:   Procedure Laterality Date   BACK SURGERY     CHOLECYSTECTOMY     COLONOSCOPY WITH PROPOFOL N/A 05/18/2017   Procedure: COLONOSCOPY WITH PROPOFOL;  Surgeon: Scot Jun,  MD;  Location: [facility] ENDOSCOPY;  Service: Endoscopy;  Laterality: N/A;   FOOT SURGERY  2015   GALLBLADDER SURGERY  2008   HARDWARE REMOVAL Left 02/14/2016   Procedure: LEFT FOOT REMOVAL DEEP IMPLANT;  Surgeon: Toni Arthurs, MD;  Location: [facility];  Service: Orthopedics;  Laterality: Left;   HERNIA REPAIR     Inguinal Hernia Repair   SPINE SURGERY  2014   Past Surgical History:  Procedure Laterality Date   BACK SURGERY     CHOLECYSTECTOMY     COLONOSCOPY WITH PROPOFOL N/A 05/18/2017   Procedure: COLONOSCOPY WITH PROPOFOL;  Surgeon: Scot Jun, MD;  Location: [facility] END[facility]COPY;  Service: Endoscopy;  Laterality: N/A;   FOOT SURGERY  2015   GALLBLADDER SURGERY  2008   HARDWARE REMOVAL Left 02/14/2016   Procedure: LEFT FOOT REMOVAL DEEP IMPLANT;  Surgeon: Toni Arthurs, MD;  Location: [facility];  Service: Orthopedics;  Laterality: Left;   HERNIA REPAIR     Inguinal Hernia Repair   SPINE SURGERY  2014   Past Medical History:  Diagnosis Date   Allergy    Anxiety    Aspiration pneumonia (HCC)    Depression    Frequent headaches    H/O   GERD (gastroesophageal reflux disease)    History of chicken pox    History of colon polyps    Hx of migraines    Hypertension    Lymphedema    Multiple sclerosis (HCC) 2011   OSA (obstructive sleep apnea)    PONV (postoperative nausea and vomiting)    Scoliosis    There were no vitals taken for this visit.  Opioid Risk Score:   Fall Risk Score:  `1  Depression screen PHQ 2/9     01/07/2022    3:15 PM 11/05/2021   10:10 AM 08/27/2021   11:12 AM 08/15/2021   10:50 AM 07/19/2021    3:28 PM 06/18/2021    9:24 AM 05/31/2021    1:47 PM  Depression screen PHQ 2/9  Decreased Interest 0 0 2 0 1 3 3   Down, Depressed, Hopeless 0 1 2 0 1 3 3   PHQ  - 2 Score 0 1 4 0 2 6 6     Review of Systems  Constitutional: Negative.   HENT: Negative.    Eyes: Negative.   Respiratory:  Negative for cough.        Chest congestion  Cardiovascular: Negative.   Gastrointestinal: Negative.   Endocrine: Negative.   Genitourinary: Negative.   Musculoskeletal:  Positive for back pain and gait problem.  Skin: Negative.   Allergic/Immunologic: Negative.   Hematological:  Bruises/bleeds easily.       Plavix  Psychiatric/Behavioral:  Positive for dysphoric mood.   All other systems reviewed and are negative.      Objective:   Physical Exam Patient seen via phone     Assessment & Plan:  1) Anxiety: -discussed with patient the side effects of both Seroquel and Lorazepam, as well as potential interactions with other medications. -discussed referral to psychiatry, placed referral to Dr. Garnette Scheuermann who was recommended to her by Dr. Kieth Brightly, discussed that it may be at least a week before she hears from them. -continue Zoloft, discussed that this will take 2-4 weeks to have effect, reinforced that she may not feel benefit from this yet.  -discussed benefits of exercise and good nutrition -discussed her move because it is stressing her.  -discussed whether we should increase Seroquel due to her  anxiety. Discussed that she is currently taking 12.5mg  3 times per day PRN -discussed about her stress of seeing many different doctors -advised to always use the lowest dose of medications that she needs to minimize side effects -discussed the buspar that was started for her by Dr. Leilani Merl- she has not yet noted benefits from this, advised that she does not need to take it if she does not feel benefits.  -referred to psychiatry in [facility] for CBT -reinforced importance of thinking positively.  -continue to encourage non-pharmacologic management approaches as well, such as meditation, applying lavender oil, and exercise- all of which she has been trying and  which have been helping.  -discussed alternative anxiety medications.  -continue seroquel 12.5mg  daily and 25mg  at night. Can decreased to 25mg  at night and assess positive/negative effects during the day. Can restart 1/2 tab during the day if anxiety is uncontrolled.  -discontinue lorazepam.  -establish care with Dr. Kieth Brightly for neuropsych counseling on 8/2. They really valued the appointment with him.  -discontinue topamax in case pain in right side is due to medication- discussed can also be due to antibiotics which she is taking for respiratory infection- discontinuing the topamax will help [place] know if it is the cause of her pain or not. -refilled [name] 30mg  HS.  -refilled Cymbalta 60mg  -Discussed exercise and meditation as tools to decrease anxiety. -Recommended Down Dog Yoga app -Discussed spending time outdoors. -Discussed positive re-framing of anxiety.  -Discussed the following foods that have been show to reduce anxiety: 1) [place] nuts, mushrooms, soy beans due to their high selenium content. Upper limit of toxicity of selenium is 475mcg/day so no more than 3-4 [place] nuts per day.  2) Fatty fish such as salmon, mackerel, sardines, trout, and herring- high in omega-3 fatty acids 3) Eggs- increases serotonin and dopamine 4) Pumpkin seeds- high in omega-3 fatty acids 5) dark chocolate- high in flavanols that increase blood flow to brain 6) turmeric- take with black pepper to increase absorption 7) chamomile tea- antioxidant and anti-inflammatory properties 8) yogurt without sugar- supports gut-brain axis 9) green tea- contains L- theanine 10) blueberries- high in vitamin C and antioxidants 11) [place]- high in tryptophan which gets converted to serotonin 12) bell peppers- rich in vitamin C and antioxidants 13) citrus fruits- rich in vitamin C and antioxidants 14) almonds- high in vitamin E and healthy fats 15) chia seeds- high in omega-3 fatty acids  2) MS -continue home  therapy to minimize stiffness/maximize mobility -f/u with MS specialist. Discussed her appointmenr with Dr. Leilani Merl.  -provided with neurology referral -discussed trying lipoic acid  3) HTN:  -discussed good blood pressure today.  BP has been 130s-150s/80s, recommended citrus foods and nuts, as much mobility as she can tolerate, log Bps daily and bring log to f/u appointment.  -continue current regimen and checking and checking BP.  -advised citrus foods and nuts to help lower BP. Discussed that once BP is consistently 120/80 we can wean her Losartan to 25mg .   4) Bilateral lower extremity edema: - continue lymphedema therapy which is greatly helping.  -Lasix discontinued. Discussed that this should also improve her kidney function -advised low salt diet.  -prescribed new compression garments, will fax to company.  -discussed doing the compression garments early in the morning at 9am  -discussed that her kidney function is normal on last check in March.   5) Insomnia: -recommended tart cherry juice with dinner, valerian root and chamomile teas in evening, applying lavender drops to forehead  at night. -continue [name] 30mg  HS- discussed that we will not try to wean off again since we saw how much this is benefittng her -discontinue amantadine.  -discussed considering the Inspire trial since he has tried the CPAP for a long time now and can not tolerate it.   6) Obesity BMI 33.13 -attempted to wean off [name] but she developed worsening insomnia, anxiety, and decreased appetite that was undesirable to her, so have restarted the medication.  -discussed trial of avoiding gluten for 3 months and how this could potentially help with her weight loss, anxiety, and prevention/slowing of her chronic diseases. -discussed benefits of intermittent fasting- made goal to set a consistent dinner time and avoid snacking after this time. If she is hungry, choose a healthy snack of nuts, fruit, or  vegetables.  -provided a link to a pdf of Pete Escogue's musculoskeletal alignment exercises: [email or web address]   7) Chest congestion -referred for pulmonary consult with Dr. Fulton Reek, who treated her father for pulmonary fibrosis. She has made an appointment to establish care with him.  -recommended drinking daily warm water with honey, lime and ginger -recommended drinking tea with holy Basil (Tulsi)  8) Sore throat: -continue salt water gargling  9) Sinus congestion: -recommended humidifier -drink 6-8 glasses of water per day.   10) fatigue -checked B12, folate, TSH, vitamin D, magnesium and discussed bloodwork with patient and husband: all in normal range except for suboptimal Vitamin D- high dose supplement prescribed -Recommended continuing NAC (N-Acetyl cysteine) 600mg  BID for her fatigue. Discussed its benefits in boosting glutathione and mitochondrial function. -prescribed monthly injectable B12 as patient would like to try this. Discussed that there are few risks to having elevated B12 levels -discussed that not wearing her CPAP is also likely contributing to her fatigue -discussed modafinil but she prefers to try the B12 first   11) OSA -discussed that OSA can contribute to daytime fatigue, weight gain, anxiety, and chronic conditions so it is good to get this condition treated. -referred to Dr. Christia Reading for eval for [facility] given that she is having a hard time sleeping with her CPAP. Discussed her decision not to pursue Inspire at this time and instead try to tolerate her current mask as best as possible. Discussed her positive conversation with Dr. Kieth Brightly. -advised her to talk to her respiratory therapist about fitting another mask -discussed Inspire but she says unfortunately it is not covered by insurance.  -discussed strap under the chin.  -discussed following up with pulmonology for Inspire.  -agreed  with her decision to continue to try CPAP before pursuing Inspire due to her fears of the procedure  12) Low back pain: -recommended doTerra Deep Blue Essential oil and applied to area of pain today- discussed that this is made of natural plant oils. Shared by personal experience of benefit from use of this essential oil -provided a link to a pdf of Pete Escogue's musculoskeletal alignment exercises: [email or web address]   13) Lower extremity weakness: -discussed that this could be a possible side effect of the Risperdal.  -Recommended calling Dr. Bonnita Hollow office to let him know about the lower extremity weakness -discussed that she should go to the ED as unilateral lower extremity weakness could be a symptom of stroke or MS flare -discussed stopping Risperdal until she can talk with Dr. Epimenio Foot -Discussed using Seroquel prn for anxiety in the meantime  14) Depression -prescribed Zoloft which she has had good response to in the past.  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 Garnette Scheuermann who was recommended to her by Dr. Kieth Brightly, recommending her to call her children to let her know she would like to see her grandkids

## 2022-04-07 ENCOUNTER — Telehealth: Payer: Self-pay | Admitting: Internal Medicine

## 2022-04-07 ENCOUNTER — Telehealth: Payer: Self-pay

## 2022-04-07 ENCOUNTER — Telehealth: Payer: Self-pay | Admitting: Neurology

## 2022-04-07 DIAGNOSIS — R262 Difficulty in walking, not elsewhere classified: Secondary | ICD-10-CM | POA: Diagnosis not present

## 2022-04-07 DIAGNOSIS — R2681 Unsteadiness on feet: Secondary | ICD-10-CM | POA: Diagnosis not present

## 2022-04-07 DIAGNOSIS — G35 Multiple sclerosis: Secondary | ICD-10-CM | POA: Diagnosis not present

## 2022-04-07 DIAGNOSIS — M6281 Muscle weakness (generalized): Secondary | ICD-10-CM | POA: Diagnosis not present

## 2022-04-07 DIAGNOSIS — R279 Unspecified lack of coordination: Secondary | ICD-10-CM | POA: Diagnosis not present

## 2022-04-07 DIAGNOSIS — I69398 Other sequelae of cerebral infarction: Secondary | ICD-10-CM | POA: Diagnosis not present

## 2022-04-07 NOTE — Telephone Encounter (Signed)
error 

## 2022-04-07 NOTE — Telephone Encounter (Signed)
Referral for Psychiatry sent to [facility] [phone]. 

## 2022-04-08 ENCOUNTER — Telehealth: Payer: Self-pay | Admitting: Neurology

## 2022-04-08 NOTE — Telephone Encounter (Signed)
In box waiting for sig

## 2022-04-08 NOTE — Telephone Encounter (Addendum)
Called the patient and made her aware of this information. Advised order will be given to our infusion suite and they will see about getting her in for the solumedrol.

## 2022-04-09 ENCOUNTER — Encounter: Payer: Self-pay | Admitting: Internal Medicine

## 2022-04-09 ENCOUNTER — Telehealth (INDEPENDENT_AMBULATORY_CARE_PROVIDER_SITE_OTHER): Payer: Medicare Other | Admitting: Internal Medicine

## 2022-04-09 ENCOUNTER — Encounter: Payer: Medicare Other | Admitting: Dermatology

## 2022-04-09 ENCOUNTER — Telehealth: Payer: Self-pay | Admitting: Internal Medicine

## 2022-04-09 DIAGNOSIS — I10 Essential (primary) hypertension: Secondary | ICD-10-CM

## 2022-04-09 DIAGNOSIS — K635 Polyp of colon: Secondary | ICD-10-CM

## 2022-04-09 DIAGNOSIS — E7800 Pure hypercholesterolemia, unspecified: Secondary | ICD-10-CM

## 2022-04-09 DIAGNOSIS — F419 Anxiety disorder, unspecified: Secondary | ICD-10-CM

## 2022-04-09 DIAGNOSIS — Z8673 Personal history of transient ischemic attack (TIA), and cerebral infarction without residual deficits: Secondary | ICD-10-CM | POA: Diagnosis not present

## 2022-04-09 DIAGNOSIS — R739 Hyperglycemia, unspecified: Secondary | ICD-10-CM | POA: Diagnosis not present

## 2022-04-09 DIAGNOSIS — Z9989 Dependence on other enabling machines and devices: Secondary | ICD-10-CM

## 2022-04-09 DIAGNOSIS — F32A Depression, unspecified: Secondary | ICD-10-CM

## 2022-04-09 DIAGNOSIS — G4733 Obstructive sleep apnea (adult) (pediatric): Secondary | ICD-10-CM | POA: Diagnosis not present

## 2022-04-09 DIAGNOSIS — G35 Multiple sclerosis: Secondary | ICD-10-CM | POA: Diagnosis not present

## 2022-04-09 NOTE — Telephone Encounter (Signed)
Noted and patient advised on date/time to be there.

## 2022-04-09 NOTE — Telephone Encounter (Signed)
No.  I don't think so.  I told them we would contact her with appt.  She just needs to go to [place] - for appt.

## 2022-04-09 NOTE — Telephone Encounter (Signed)
Please call and notify Carly [name] that Dr Raul Del wants to see her at 11:15 on Friday 04/11/22.  (Pulmonary MD - that we discussed today).

## 2022-04-09 NOTE — Progress Notes (Deleted)
Patient ID: Carly [name], female   DOB: June 19, 1951, 71 y.o.   MRN: [id number]   Subjective:    Patient ID: Carly [name], female    DOB: 04-12-1951, 71 y.o.   MRN: [id number]  This visit occurred during the SARS-CoV-2 public health emergency.  Safety protocols were in place, including screening questions prior to the visit, additional usage of staff PPE, and extensive cleaning of exam room while observing appropriate contact time as indicated for disinfecting solutions.   Patient here for  Chief Complaint  Patient presents with   Pre-op Exam    Colonoscopy 7/12 w/ Dr Haig Prophet   .   HPI    Past Medical History:  Diagnosis Date   Allergy    Anxiety    Aspiration pneumonia (HCC)    Depression    Frequent headaches    H/O   GERD (gastroesophageal reflux disease)    History of chicken pox    History of colon polyps    Hx of migraines    Hypertension    Lymphedema    Multiple sclerosis ([place]) 2011   OSA (obstructive sleep apnea)    PONV (postoperative nausea and vomiting)    Scoliosis    Past Surgical History:  Procedure Laterality Date   BACK SURGERY     CHOLECYSTECTOMY     COLONOSCOPY WITH PROPOFOL N/A 05/18/2017   Procedure: COLONOSCOPY WITH PROPOFOL;  Surgeon: [name], MD;  Location: [facility] ENDOSCOPY;  Service: Endoscopy;  Laterality: N/A;   FOOT SURGERY  2015   GALLBLADDER SURGERY  2008   HARDWARE REMOVAL Left 02/14/2016   Procedure: LEFT FOOT REMOVAL DEEP IMPLANT;  Surgeon: [name], MD;  Location: [employer];  Service: Orthopedics;  Laterality: Left;   HERNIA REPAIR     Inguinal Hernia Repair   SPINE SURGERY  2014   Family History  Problem Relation Age of Onset   Arthritis Mother    Hypertension Mother    Macular degeneration Mother    Hypertension Father    Hyperlipidemia Father    Heart disease Maternal Grandfather    Diabetes Maternal Grandfather    Kidney disease Paternal Grandmother    Social History   Socioeconomic  History   Marital status: Married    Spouse name: Engineer, technical sales   Number of children: 3   Years of education: 14   Highest education level: Associate degree: academic program  Occupational History   Not on file  Tobacco Use   Smoking status: Never   Smokeless tobacco: Never  Vaping Use   Vaping Use: Never used  Substance and Sexual Activity   Alcohol use: No    Alcohol/week: 0.0 standard drinks of alcohol   Drug use: No   Sexual activity: Not Currently  Other Topics Concern   Not on file  Social History Narrative   Lives with husband    Right handed   Caffeine: 1 cup of coffee in the AM, coke occa. Trying to come off caffeine    Social Determinants of Health   Financial Resource Strain: Low Risk  (01/07/2022)   Overall Financial Resource Strain (CARDIA)    Difficulty of Paying Living Expenses: Not hard at all  Food Insecurity: No Food Insecurity (01/07/2022)   Hunger Vital Sign    Worried About Running Out of Food in the Last Year: Never true    Ran Out of Food in the Last Year: Never true  Transportation Needs: No Transportation Needs (01/07/2022)   PRAPARE -  Hydrologist (Medical): No    Lack of Transportation (Non-Medical): No  Physical Activity: Insufficiently Active (01/07/2022)   Exercise Vital Sign    Days of Exercise per Week: 2 days    Minutes of Exercise per Session: 60 min  Stress: No Stress Concern Present (01/07/2022)   [place]    Feeling of Stress : Not at all  Social Connections: Unknown (01/07/2022)   Social Connection and Isolation Panel [NHANES]    Frequency of Communication with Friends and Family: Not on file    Frequency of Social Gatherings with Friends and Family: Not on file    Attends Religious Services: Not on file    Active Member of Clubs or Organizations: Not on file    Attends Archivist Meetings: Not on file    Marital Status: Married      Review of Systems     Objective:     BP (!) 141/86   Ht '5\' 3"'$  (1.6 m)   Wt 190 lb (86.2 kg)   BMI 33.66 kg/m  Wt Readings from Last 3 Encounters:  04/09/22 190 lb (86.2 kg)  03/28/22 190 lb (86.2 kg)  03/25/22 193 lb (87.5 kg)    Physical Exam   Outpatient Encounter Medications as of 04/09/2022  Medication Sig   acetaminophen (TYLENOL) 500 MG tablet Take 1 tablet (500 mg total) by mouth every 4 (four) hours as needed.   albuterol (VENTOLIN HFA) 108 (90 Base) MCG/ACT inhaler Inhale 2 puffs into the lungs every 6 (six) hours as needed.   amLODipine (NORVASC) 5 MG tablet TAKE 1 TABLET BY MOUTH DAILY.   azelastine (ASTELIN) 0.1 % nasal spray SMARTSIG:1-2 Spray(s) Both Nares Twice Daily   Baclofen 5 MG TABS Take 1 tablet by mouth in the morning, at noon, in the evening, and at bedtime.   busPIRone (BUSPAR) 15 MG tablet Take 1 tablet (15 mg total) by mouth 2 (two) times daily.   Cholecalciferol (D3-1000 PO) Take 2,000 Units by mouth daily.   clopidogrel (PLAVIX) 75 MG tablet TAKE 1 TABLET (75 MG) BY MOUTH EVERY DAY   Coenzyme Q10 (CO Q10) 100 MG CAPS Take 1 capsule by mouth daily.   cyanocobalamin (,VITAMIN B-12,) 1000 MCG/ML injection Inject 1 mL (1,000 mcg total) into the muscle every 30 (thirty) days.   dalfampridine 10 MG TB12 Take 1 tablet (10 mg total) by mouth every 12 (twelve) hours.   docusate sodium (COLACE) 100 MG capsule Take 100 mg by mouth 2 (two) times daily.   DULoxetine (CYMBALTA) 60 MG capsule Take 1 capsule (60 mg total) by mouth daily.   fluticasone (FLONASE) 50 MCG/ACT nasal spray Place into the nose.   gabapentin (NEURONTIN) 100 MG capsule Take 1 capsule (100 mg total) by mouth 2 (two) times daily. (Patient taking differently: Take 100 mg by mouth daily.)   losartan (COZAAR) 50 MG tablet TAKE 1 TABLET BY MOUTH DAILY   magnesium oxide (MAG-OX) 400 MG tablet TAKE 1 TABLET BY MOUTH DAILY   METAMUCIL FIBER PO Take by mouth 2 (two) times daily.   nystatin  cream (MYCOSTATIN) Apply 1 application topically 2 (two) times daily.   omeprazole (PRILOSEC) 20 MG capsule Take 20 mg by mouth 2 (two) times daily before a meal.   ondansetron (ZOFRAN) 4 MG tablet Take 1 tablet (4 mg total) by mouth every 8 (eight) hours as needed.   QUEtiapine (SEROQUEL) 25 MG tablet Take  25 mg by mouth at bedtime.   rosuvastatin (CRESTOR) 5 MG tablet TAKE 1 TABLET BY MOUTH EVERY MONDAY, WEDNESDAY AND FRIDAY.   saccharomyces boulardii (FLORASTOR) 250 MG capsule Take 1 capsule (250 mg total) by mouth 2 (two) times daily.   sertraline (ZOLOFT) 25 MG tablet Take 1 tablet (25 mg total) by mouth daily.   sodium chloride (OCEAN) 0.65 % nasal spray Place 1-2 sprays into the nose as needed.   temazepam (RESTORIL) 15 MG capsule Take 1 capsule (15 mg total) by mouth at bedtime as needed for sleep.   tiZANidine (ZANAFLEX) 4 MG tablet Take 1 tablet (4 mg total) by mouth every 6 (six) hours as needed for muscle spasms.   Facility-Administered Encounter Medications as of 04/09/2022  Medication   0.9 %  sodium chloride infusion   acetaminophen (TYLENOL) tablet 650 mg     Lab Results  Component Value Date   WBC 10.3 03/28/2022   HGB 13.4 03/28/2022   HCT 41.8 03/28/2022   PLT 325 03/28/2022   GLUCOSE 177 (H) 03/28/2022   CHOL 154 03/07/2022   TRIG 160 (H) 03/07/2022   HDL 69 03/07/2022   LDLDIRECT 127.0 08/17/2020   LDLCALC 58 03/07/2022   ALT 24 03/28/2022   AST 23 03/28/2022   NA 141 03/28/2022   K 3.8 03/28/2022   CL 106 03/28/2022   CREATININE 0.68 03/28/2022   BUN 14 03/28/2022   CO2 26 03/28/2022   TSH 2.050 03/07/2022   HGBA1C 5.8 (H) 03/07/2022    MR Brain W and Wo Contrast  Result Date: 03/28/2022 CLINICAL DATA:  MS flare versus CVA, lower extremity weakness EXAM: MRI HEAD WITHOUT AND WITH CONTRAST TECHNIQUE: Multiplanar, multiecho pulse sequences of the brain and surrounding structures were obtained without and with intravenous contrast. CONTRAST:  35m GADAVIST  GADOBUTROL 1 MMOL/ML IV SOLN COMPARISON:  12/09/2020 correlation is also made with CT head 03/28/2022 FINDINGS: Brain: No restricted diffusion to suggest acute or subacute infarct. No acute hemorrhage, mass, mass effect, midline shift. No abnormal parenchymal or meningeal enhancement. Lacunar infarcts in the corona radiata (including the infarct that was new on 12/09/2020), basal ganglia, thalami, and pons. Punctate foci of hemosiderin deposition in the bilateral cerebral and cerebellar hemispheres, as well as the deep white matter, likely sequela of hypertensive microhemorrhages. Confluent T2 hyperintense signal in the periventricular white matter and pons, likely the sequela of severe chronic small vessel ischemic disease. Degree of cerebral volume loss is advanced for age, with ex vacuo dilatation of the lateral ventricles. Vascular: Normal arterial flow voids. Normal vascular enhancement. Venous sinuses are patent on postcontrast imaging. Skull and upper cervical spine: Normal marrow signal. Sinuses/Orbits: No acute finding. Other: None. IMPRESSION: No acute intracranial process. No evidence of acute or subacute infarct. Electronically Signed   By: AMerilyn BabaM.D.   On: 03/28/2022 19:07   CT HEAD WO CONTRAST  Result Date: 03/28/2022 CLINICAL DATA:  Dizziness. Weakness/inability to walk. History of multiple sclerosis. EXAM: CT HEAD WITHOUT CONTRAST TECHNIQUE: Contiguous axial images were obtained from the base of the skull through the vertex without intravenous contrast. RADIATION DOSE REDUCTION: This exam was performed according to the departmental dose-optimization program which includes automated exposure control, adjustment of the mA and/or kV according to patient size and/or use of iterative reconstruction technique. COMPARISON:  Head CT 12/08/2020 and MRI 12/09/2020 FINDINGS: Brain: There is no evidence of an acute cortically based infarct, intracranial hemorrhage, mass, midline shift, or extra-axial  fluid collection. A chronic lacunar  infarct is noted in the left corona radiata (acute on the prior MRI). Confluent hypodensities elsewhere in the cerebral white matter bilaterally are unchanged. There is an unchanged chronic lacunar infarct in the left thalamus. Mild-to-moderate ventriculomegaly is unchanged and likely reflects central predominant cerebral atrophy. Vascular: Calcified atherosclerosis at the skull base. No hyperdense vessel. Skull: No fracture or suspicious osseous lesion. Sinuses/Orbits: Visualized paranasal sinuses and mastoid air cells are clear. Unremarkable orbits. Other: None. IMPRESSION: 1. No evidence of acute intracranial abnormality. 2. Extensive chronic cerebral white matter disease, possibly reflecting a combination of demyelinating disease and chronic small vessel ischemia. Electronically Signed   By: Logan Bores M.D.   On: 03/28/2022 15:29       Assessment & Plan:   Problem List Items Addressed This Visit   None    Einar Pheasant, MD

## 2022-04-09 NOTE — Telephone Encounter (Signed)
I called and notified patient of this & she was okay with that appointment date/time. Will her office be reaching out to her?

## 2022-04-10 DIAGNOSIS — I69398 Other sequelae of cerebral infarction: Secondary | ICD-10-CM | POA: Diagnosis not present

## 2022-04-10 DIAGNOSIS — R279 Unspecified lack of coordination: Secondary | ICD-10-CM | POA: Diagnosis not present

## 2022-04-10 DIAGNOSIS — G35 Multiple sclerosis: Secondary | ICD-10-CM | POA: Diagnosis not present

## 2022-04-10 DIAGNOSIS — R262 Difficulty in walking, not elsewhere classified: Secondary | ICD-10-CM | POA: Diagnosis not present

## 2022-04-10 DIAGNOSIS — R2681 Unsteadiness on feet: Secondary | ICD-10-CM | POA: Diagnosis not present

## 2022-04-10 DIAGNOSIS — M6281 Muscle weakness (generalized): Secondary | ICD-10-CM | POA: Diagnosis not present

## 2022-04-10 NOTE — Telephone Encounter (Signed)
Christine called from [employer] to follow-up on a faxed request for additional garment for patient's pneumatic compression device.  Altha Harm states her fax number is [phone].

## 2022-04-11 DIAGNOSIS — M6281 Muscle weakness (generalized): Secondary | ICD-10-CM | POA: Diagnosis not present

## 2022-04-11 DIAGNOSIS — R2681 Unsteadiness on feet: Secondary | ICD-10-CM | POA: Diagnosis not present

## 2022-04-11 DIAGNOSIS — I69398 Other sequelae of cerebral infarction: Secondary | ICD-10-CM | POA: Diagnosis not present

## 2022-04-11 DIAGNOSIS — R262 Difficulty in walking, not elsewhere classified: Secondary | ICD-10-CM | POA: Diagnosis not present

## 2022-04-11 DIAGNOSIS — R279 Unspecified lack of coordination: Secondary | ICD-10-CM | POA: Diagnosis not present

## 2022-04-11 DIAGNOSIS — G35 Multiple sclerosis: Secondary | ICD-10-CM | POA: Diagnosis not present

## 2022-04-11 NOTE — Telephone Encounter (Signed)
Patient has an  appointment scheduled with Silvio Pate, PA on 04/28/2022 at 3:40 pm.

## 2022-04-11 NOTE — Telephone Encounter (Signed)
Faxed prev on 6/28 - confirmation received. Faxed again 6/30 8:21am

## 2022-04-14 ENCOUNTER — Encounter: Payer: BC Managed Care – PPO | Admitting: Psychology

## 2022-04-14 ENCOUNTER — Telehealth: Payer: Self-pay | Admitting: Internal Medicine

## 2022-04-14 ENCOUNTER — Telehealth (INDEPENDENT_AMBULATORY_CARE_PROVIDER_SITE_OTHER): Payer: Medicare Other | Admitting: Internal Medicine

## 2022-04-14 ENCOUNTER — Other Ambulatory Visit: Payer: Self-pay

## 2022-04-14 ENCOUNTER — Telehealth: Payer: Self-pay

## 2022-04-14 ENCOUNTER — Ambulatory Visit: Payer: BC Managed Care – PPO | Admitting: Psychology

## 2022-04-14 ENCOUNTER — Encounter: Payer: Self-pay | Admitting: Internal Medicine

## 2022-04-14 DIAGNOSIS — G35 Multiple sclerosis: Secondary | ICD-10-CM | POA: Diagnosis not present

## 2022-04-14 DIAGNOSIS — G4733 Obstructive sleep apnea (adult) (pediatric): Secondary | ICD-10-CM

## 2022-04-14 DIAGNOSIS — Z9989 Dependence on other enabling machines and devices: Secondary | ICD-10-CM | POA: Diagnosis not present

## 2022-04-14 DIAGNOSIS — F32A Depression, unspecified: Secondary | ICD-10-CM

## 2022-04-14 DIAGNOSIS — R300 Dysuria: Secondary | ICD-10-CM

## 2022-04-14 DIAGNOSIS — R350 Frequency of micturition: Secondary | ICD-10-CM | POA: Diagnosis not present

## 2022-04-14 DIAGNOSIS — F419 Anxiety disorder, unspecified: Secondary | ICD-10-CM

## 2022-04-14 DIAGNOSIS — N390 Urinary tract infection, site not specified: Secondary | ICD-10-CM | POA: Diagnosis not present

## 2022-04-14 NOTE — Progress Notes (Signed)
Patient ID: Carly [name], female   DOB: 07-31-1951, 71 y.o.   MRN: [id number]   Virtual Visit via video Note  All issues noted in this document were discussed and addressed.  No physical exam was performed (except for noted visual exam findings with Video Visits).   I connected with Geoffry Paradise by a video enabled telemedicine application and verified that I am speaking with the correct person using two identifiers. Location patient: home Location provider: work  Persons participating in the virtual visit: patient, provider  The limitations, risks, security and privacy concerns of performing an evaluation and management service by video and the availability of in person appointments have been discussed.  It has also been discussed with the patient that there may be a patient responsible charge related to this service. The patient expressed understanding and agreed to proceed.  Interactive audio and video telecommunications were attempted between this provider and patient, however failed, due to technical difficulties.  She could see and hear me throughout the exam, but I could only hear her.  We continued and completed visit as above.   Reason for visit: work in appt.   HPI: Work in with concerns regarding increased urinary frequency.  Has had some issues with increased urinary frequency.  Noticed increase last night.  Caretaker reported that urine had a bad odor.  No dysuria.  No hematuria.  No abdominal pain or back pain reported with the increased frequency.  No fever.  No vomiting.  Eating.  Lives at [facility].  Gave urine sample to check - at the [facility].  Waiting for results.  Moving to skilled nursing for therapy, etc.  Increased stress related.  On zoloft.  Referred to psychiatry.    ROS: See pertinent positives and negatives per HPI.  Past Medical History:  Diagnosis Date   Allergy    Anxiety    Aspiration pneumonia (HCC)    Depression    Frequent headaches     H/O   GERD (gastroesophageal reflux disease)    History of chicken pox    History of colon polyps    Hx of migraines    Hypertension    Lymphedema    Multiple sclerosis (HCC) 2011   OSA (obstructive sleep apnea)    PONV (postoperative nausea and vomiting)    Scoliosis     Past Surgical History:  Procedure Laterality Date   BACK SURGERY     CHOLECYSTECTOMY     COLONOSCOPY WITH PROPOFOL N/A 05/18/2017   Procedure: COLONOSCOPY WITH PROPOFOL;  Surgeon: Scot Jun, MD;  Location: [facility] END[facility]COPY;  Service: Endoscopy;  Laterality: N/A;   FOOT SURGERY  2015   GALLBLADDER SURGERY  2008   HARDWARE REMOVAL Left 02/14/2016   Procedure: LEFT FOOT REMOVAL DEEP IMPLANT;  Surgeon: Toni Arthurs, MD;  Location: [facility];  Service: Orthopedics;  Laterality: Left;   HERNIA REPAIR     Inguinal Hernia Repair   SPINE SURGERY  2014    Family History  Problem Relation Age of Onset   Arthritis Mother    Hypertension Mother    Macular degeneration Mother    Hypertension Father    Hyperlipidemia Father    Heart disease Maternal Grandfather    Diabetes Maternal Grandfather    Kidney disease Paternal Grandmother     SOCIAL HX: reviewed.    Current Outpatient Medications:    acetaminophen (TYLENOL) 500 MG tablet, Take 1 tablet (500 mg total) by mouth every 4 (four) hours  as needed., Disp: 30 tablet, Rfl: 0   albuterol (VENTOLIN HFA) 108 (90 Base) MCG/ACT inhaler, Inhale 2 puffs into the lungs every 6 (six) hours as needed., Disp: 18 g, Rfl: 0   amLODipine (NORVASC) 5 MG tablet, TAKE 1 TABLET BY MOUTH DAILY., Disp: 90 tablet, Rfl: 1   azelastine (ASTELIN) 0.1 % nasal spray, SMARTSIG:1-2 Spray(s) Both Nares Twice Daily, Disp: , Rfl:    Baclofen 5 MG TABS, Take 1 tablet by mouth in the morning, at noon, in the evening, and at bedtime., Disp: 120 tablet, Rfl: 5   busPIRone (BUSPAR) 15 MG tablet, Take 1 tablet (15 mg total) by mouth 2 (two) times daily., Disp: 60 tablet, Rfl: 5    Cholecalciferol (D3-1000 PO), Take 2,000 Units by mouth daily., Disp: , Rfl:    clopidogrel (PLAVIX) 75 MG tablet, TAKE 1 TABLET (75 MG) BY MOUTH EVERY DAY, Disp: 90 tablet, Rfl: 1   Coenzyme Q10 (CO Q10) 100 MG CAPS, Take 1 capsule by mouth daily., Disp: , Rfl:    cyanocobalamin (,VITAMIN B-12,) 1000 MCG/ML injection, Inject 1 mL (1,000 mcg total) into the muscle every 30 (thirty) days., Disp: 1 mL, Rfl: 3   dalfampridine 10 MG TB12, Take 1 tablet (10 mg total) by mouth every 12 (twelve) hours., Disp: 60 tablet, Rfl: 11   docusate sodium (COLACE) 100 MG capsule, Take 100 mg by mouth 2 (two) times daily., Disp: , Rfl:    DULoxetine (CYMBALTA) 60 MG capsule, Take 1 capsule (60 mg total) by mouth daily., Disp: 90 capsule, Rfl: 3   fluticasone (FLONASE) 50 MCG/ACT nasal spray, Place into the nose., Disp: , Rfl:    gabapentin (NEURONTIN) 100 MG capsule, Take 1 capsule (100 mg total) by mouth 2 (two) times daily. (Patient taking differently: Take 100 mg by mouth daily.), Disp: 60 capsule, Rfl: 11   losartan (COZAAR) 50 MG tablet, TAKE 1 TABLET BY MOUTH DAILY, Disp: 90 tablet, Rfl: 1   magnesium oxide (MAG-OX) 400 MG tablet, TAKE 1 TABLET BY MOUTH DAILY, Disp: 30 tablet, Rfl: 5   METAMUCIL FIBER PO, Take by mouth 2 (two) times daily., Disp: , Rfl:    nystatin cream (MYCOSTATIN), Apply 1 application topically 2 (two) times daily., Disp: 30 g, Rfl: 11   omeprazole (PRILOSEC) 20 MG capsule, Take 20 mg by mouth 2 (two) times daily before a meal., Disp: , Rfl:    ondansetron (ZOFRAN) 4 MG tablet, Take 1 tablet (4 mg total) by mouth every 8 (eight) hours as needed., Disp: 20 tablet, Rfl: 0   QUEtiapine (SEROQUEL) 25 MG tablet, Take 25 mg by mouth at bedtime., Disp: , Rfl:    rosuvastatin (CRESTOR) 5 MG tablet, TAKE 1 TABLET BY MOUTH EVERY MONDAY, WEDNESDAY AND FRIDAY., Disp: 90 tablet, Rfl: 1   saccharomyces boulardii (FLORASTOR) 250 MG capsule, Take 1 capsule (250 mg total) by mouth 2 (two) times daily., Disp:  60 capsule, Rfl: 0   sertraline (ZOLOFT) 25 MG tablet, Take 1 tablet (25 mg total) by mouth daily., Disp: 90 tablet, Rfl: 3   sodium chloride (OCEAN) 0.65 % nasal spray, Place 1-2 sprays into the nose as needed., Disp: , Rfl:    temazepam (RESTORIL) 15 MG capsule, Take 1 capsule (15 mg total) by mouth at bedtime as needed for sleep., Disp: 30 capsule, Rfl: 5   tiZANidine (ZANAFLEX) 4 MG tablet, Take 1 tablet (4 mg total) by mouth every 6 (six) hours as needed for muscle spasms., Disp: 30 tablet, Rfl: 11  No current facility-administered medications for this visit.  Facility-Administered Medications Ordered in Other Visits:    0.9 %  sodium chloride infusion, , Intravenous, Continuous, Corcoran, Melissa C, MD, Last Rate: 10 mL/hr at 04/25/16 0855, New Bag at 04/25/16 0855   acetaminophen (TYLENOL) tablet 650 mg, 650 mg, Oral, Once, Corcoran, Ferdie Ping, MD  EXAM:  GENERAL: alert. Sounds to be in no acute distress.  Answering questions appropriately    PSYCH/NEURO: pleasant and cooperative, speech and thought processing grossly intact  ASSESSMENT AND PLAN:  Discussed the following assessment and plan:  Problem List Items Addressed This Visit     Anxiety and depression    Medications being adjusted - previously by neurology.  Referred to psychiatry.  Started back on zoloft.        Multiple sclerosis (HCC)    Followed by Dr Epimenio Foot. Planning to move to skilled nursing - PT.  Follow.       OSA on CPAP    Has been wearing cpap.  Continue cpap.  Follow.       Urinary frequency    Urine collected today to confirm no infection.  Hold abx. Await urinalysis and culture.  Discussed overactive bladder.  Hold on further medication.  Follow.        Return if symptoms worsen or fail to improve.   I discussed the assessment and treatment plan with the patient. The patient was provided an opportunity to ask questions and all were answered. The patient agreed with the plan and demonstrated an  understanding of the instructions.   The patient was advised to call back or seek an in-person evaluation if the symptoms worsen or if the condition fails to improve as anticipated.  I provided 23 minutes of non-face-to-face time during this encounter.   Dale Gonzales, MD

## 2022-04-14 NOTE — Telephone Encounter (Signed)
Ok to send order for urinalysis and culture.  See if agreeable to do virtual visit this pm to discuss symptoms - work in for this.

## 2022-04-14 NOTE — Telephone Encounter (Signed)
Sent!

## 2022-04-14 NOTE — Telephone Encounter (Signed)
[name] called to state that she is the caregiver for patient.  [name] states patient is having frequent urination and there is some smell to her urine.  [name] states that Dr. Einar Pheasant is aware of the situation and who she is.  [name] states she would like to see if Dr. Nicki Reaper would be willing to fax an order for a lab to [facility] nursing team so that they can get the sample today at [facility].  [name] states the fax number is [phone] and asked that we please let her know when the fax has been sent so she can get the patient up and going.

## 2022-04-14 NOTE — Telephone Encounter (Signed)
Pt husband came into office to drop off form for the provider to sign. Placed in colored folder up front

## 2022-04-15 ENCOUNTER — Encounter: Payer: Self-pay | Admitting: Internal Medicine

## 2022-04-15 DIAGNOSIS — R2681 Unsteadiness on feet: Secondary | ICD-10-CM | POA: Diagnosis not present

## 2022-04-15 DIAGNOSIS — M6281 Muscle weakness (generalized): Secondary | ICD-10-CM | POA: Diagnosis not present

## 2022-04-15 DIAGNOSIS — R262 Difficulty in walking, not elsewhere classified: Secondary | ICD-10-CM | POA: Diagnosis not present

## 2022-04-15 DIAGNOSIS — I69398 Other sequelae of cerebral infarction: Secondary | ICD-10-CM | POA: Diagnosis not present

## 2022-04-15 DIAGNOSIS — R279 Unspecified lack of coordination: Secondary | ICD-10-CM | POA: Diagnosis not present

## 2022-04-15 DIAGNOSIS — G35 Multiple sclerosis: Secondary | ICD-10-CM | POA: Diagnosis not present

## 2022-04-15 DIAGNOSIS — Z9181 History of falling: Secondary | ICD-10-CM | POA: Diagnosis not present

## 2022-04-15 NOTE — Assessment & Plan Note (Signed)
Has been wearing cpap.  Continue cpap.  Follow.

## 2022-04-15 NOTE — Assessment & Plan Note (Signed)
Low-carb diet and exercise.  Follow metabolic panel and A1c. 

## 2022-04-15 NOTE — Assessment & Plan Note (Signed)
Followed by Dr Felecia Shelling as outlined.

## 2022-04-15 NOTE — Assessment & Plan Note (Signed)
Discussed colonoscopy.  Last colonoscopy 05/2017.  Recommended f/u in 05/2020.  S/p CVA so colonoscopy was postponed.   Has been > 1 year now.  Is on plavix.  See neurology note for recommendation regarding plavix.  Discussed possible risk of being off plavix.  Will need to resume as soon as possible post colonoscopy (per GI).  She plans to discuss with GI regarding previous aspiration with last colonoscopy.

## 2022-04-15 NOTE — Progress Notes (Signed)
Patient ID: Carly [name], female   DOB: 04/11/1951, 71 y.o.   MRN: [id number]   Virtual Visit via video Note  All issues noted in this document were discussed and addressed.  No physical exam was performed (except for noted visual exam findings with Video Visits).   I connected with Carly [name] by a video enabled telemedicine application and verified that I am speaking with the correct person using two identifiers. Location patient: home Location provider: work  Persons participating in the virtual visit: patient, provider and caretaker Public relations account executive)  The limitations, risks, security and privacy concerns of performing an evaluation and management service by video and the availability of in person appointments have been discussed.  It has also been discussed with the patient that there may be a patient responsible charge related to this service. The patient expressed understanding and agreed to proceed.   Reason for visit: follow up appt  HPI: Back on zoloft. Feels has improved symptoms.  Not as emotional.  Discussed continuing zoloft.  Also planning to f/u with Dr Felecia Shelling to restart infusion.  Has not had infusion 7-8 months.  No chest pain.  Breathing stable.  No increased cough or congestion.  Planning for colonoscopy 04/23/22.  Discussed colonoscopy.  Using cpap.     ROS: See pertinent positives and negatives per HPI.  Past Medical History:  Diagnosis Date   Allergy    Anxiety    Aspiration pneumonia (HCC)    Depression    Frequent headaches    H/O   GERD (gastroesophageal reflux disease)    History of chicken pox    History of colon polyps    Hx of migraines    Hypertension    Lymphedema    Multiple sclerosis ([place]) 2011   OSA (obstructive sleep apnea)    PONV (postoperative nausea and vomiting)    Scoliosis     Past Surgical History:  Procedure Laterality Date   BACK SURGERY     CHOLECYSTECTOMY     COLONOSCOPY WITH PROPOFOL N/A 05/18/2017   Procedure: COLONOSCOPY  WITH PROPOFOL;  Surgeon: [name], MD;  Location: [facility] ENDOSCOPY;  Service: Endoscopy;  Laterality: N/A;   FOOT SURGERY  2015   GALLBLADDER SURGERY  2008   HARDWARE REMOVAL Left 02/14/2016   Procedure: LEFT FOOT REMOVAL DEEP IMPLANT;  Surgeon: [name], MD;  Location: [place];  Service: Orthopedics;  Laterality: Left;   HERNIA REPAIR     Inguinal Hernia Repair   SPINE SURGERY  2014    Family History  Problem Relation Age of Onset   Arthritis Mother    Hypertension Mother    Macular degeneration Mother    Hypertension Father    Hyperlipidemia Father    Heart disease Maternal Grandfather    Diabetes Maternal Grandfather    Kidney disease Paternal Grandmother     SOCIAL HX: reviewed.    Current Outpatient Medications:    acetaminophen (TYLENOL) 500 MG tablet, Take 1 tablet (500 mg total) by mouth every 4 (four) hours as needed., Disp: 30 tablet, Rfl: 0   albuterol (VENTOLIN HFA) 108 (90 Base) MCG/ACT inhaler, Inhale 2 puffs into the lungs every 6 (six) hours as needed., Disp: 18 g, Rfl: 0   amLODipine (NORVASC) 5 MG tablet, TAKE 1 TABLET BY MOUTH DAILY., Disp: 90 tablet, Rfl: 1   azelastine (ASTELIN) 0.1 % nasal spray, SMARTSIG:1-2 Spray(s) Both Nares Twice Daily, Disp: , Rfl:    Baclofen 5 MG TABS, Take 1 tablet  by mouth in the morning, at noon, in the evening, and at bedtime., Disp: 120 tablet, Rfl: 5   busPIRone (BUSPAR) 15 MG tablet, Take 1 tablet (15 mg total) by mouth 2 (two) times daily., Disp: 60 tablet, Rfl: 5   Cholecalciferol (D3-1000 PO), Take 2,000 Units by mouth daily., Disp: , Rfl:    clopidogrel (PLAVIX) 75 MG tablet, TAKE 1 TABLET (75 MG) BY MOUTH EVERY DAY, Disp: 90 tablet, Rfl: 1   Coenzyme Q10 (CO Q10) 100 MG CAPS, Take 1 capsule by mouth daily., Disp: , Rfl:    cyanocobalamin (,VITAMIN B-12,) 1000 MCG/ML injection, Inject 1 mL (1,000 mcg total) into the muscle every 30 (thirty) days., Disp: 1 mL, Rfl: 3   dalfampridine 10 MG TB12, Take  1 tablet (10 mg total) by mouth every 12 (twelve) hours., Disp: 60 tablet, Rfl: 11   docusate sodium (COLACE) 100 MG capsule, Take 100 mg by mouth 2 (two) times daily., Disp: , Rfl:    DULoxetine (CYMBALTA) 60 MG capsule, Take 1 capsule (60 mg total) by mouth daily., Disp: 90 capsule, Rfl: 3   fluticasone (FLONASE) 50 MCG/ACT nasal spray, Place into the nose., Disp: , Rfl:    gabapentin (NEURONTIN) 100 MG capsule, Take 1 capsule (100 mg total) by mouth 2 (two) times daily. (Patient taking differently: Take 100 mg by mouth daily.), Disp: 60 capsule, Rfl: 11   losartan (COZAAR) 50 MG tablet, TAKE 1 TABLET BY MOUTH DAILY, Disp: 90 tablet, Rfl: 1   magnesium oxide (MAG-OX) 400 MG tablet, TAKE 1 TABLET BY MOUTH DAILY, Disp: 30 tablet, Rfl: 5   METAMUCIL FIBER PO, Take by mouth 2 (two) times daily., Disp: , Rfl:    nystatin cream (MYCOSTATIN), Apply 1 application topically 2 (two) times daily., Disp: 30 g, Rfl: 11   omeprazole (PRILOSEC) 20 MG capsule, Take 20 mg by mouth 2 (two) times daily before a meal., Disp: , Rfl:    ondansetron (ZOFRAN) 4 MG tablet, Take 1 tablet (4 mg total) by mouth every 8 (eight) hours as needed., Disp: 20 tablet, Rfl: 0   QUEtiapine (SEROQUEL) 25 MG tablet, Take 25 mg by mouth at bedtime., Disp: , Rfl:    rosuvastatin (CRESTOR) 5 MG tablet, TAKE 1 TABLET BY MOUTH EVERY MONDAY, WEDNESDAY AND FRIDAY., Disp: 90 tablet, Rfl: 1   saccharomyces boulardii (FLORASTOR) 250 MG capsule, Take 1 capsule (250 mg total) by mouth 2 (two) times daily., Disp: 60 capsule, Rfl: 0   sertraline (ZOLOFT) 25 MG tablet, Take 1 tablet (25 mg total) by mouth daily., Disp: 90 tablet, Rfl: 3   sodium chloride (OCEAN) 0.65 % nasal spray, Place 1-2 sprays into the nose as needed., Disp: , Rfl:    temazepam (RESTORIL) 15 MG capsule, Take 1 capsule (15 mg total) by mouth at bedtime as needed for sleep., Disp: 30 capsule, Rfl: 5   tiZANidine (ZANAFLEX) 4 MG tablet, Take 1 tablet (4 mg total) by mouth every 6  (six) hours as needed for muscle spasms., Disp: 30 tablet, Rfl: 11 No current facility-administered medications for this visit.  Facility-Administered Medications Ordered in Other Visits:    0.9 %  sodium chloride infusion, , Intravenous, Continuous, Corcoran, Melissa C, MD, Last Rate: 10 mL/hr at 04/25/16 0855, New Bag at 04/25/16 0855   acetaminophen (TYLENOL) tablet 650 mg, 650 mg, Oral, Once, Corcoran, Melissa C, MD  EXAM:  GENERAL: alert, oriented, appears well and in no acute distress  HEENT: atraumatic, conjunttiva clear, no obvious abnormalities on  inspection of external nose and ears  NECK: normal movements of the head and neck  LUNGS: on inspection no signs of respiratory distress, breathing rate appears normal, no obvious gross SOB, gasping or wheezing  CV: no obvious cyanosis  PSYCH/NEURO: pleasant and cooperative, no obvious depression or anxiety, speech and thought processing grossly intact  ASSESSMENT AND PLAN:  Discussed the following assessment and plan:  Problem List Items Addressed This Visit     Anxiety and depression    Medications being adjusted - previously by neurology.  Referred to psychiatry.  Started back on zoloft.  Appears to be doing some better.       Colon polyp    Discussed colonoscopy.  Last colonoscopy 05/2017.  Recommended f/u in 05/2020.  S/p CVA so colonoscopy was postponed.   Has been > 1 year now.  Is on plavix.  See neurology note for recommendation regarding plavix.  Discussed possible risk of being off plavix.  Will need to resume as soon as possible post colonoscopy (per GI).  She plans to discuss with GI regarding previous aspiration with last colonoscopy.       History of CVA (cerebrovascular accident)    Continue risk factor modification.  Has been tolerating statin. Continue plavix.  Discussed and she understands risk of stopping plavix for procedures.   Follow.         Hypercholesterolemia    Continue crestor.  Low cholesterol  diet and exercise.  Follow lipid panel and liver function tests.       Hyperglycemia    Low-carb diet and exercise.  Follow metabolic panel and K2H.      Hypertension    Continue amlodipine.  Follow pressures.  Recent kidney function 03/02/22 wnl.       Multiple sclerosis ([place])    Followed by Dr Felecia Shelling as outlined.       OSA on CPAP    Has been wearing cpap.  Continue cpap.  Follow.        Return in about 4 weeks (around 05/07/2022) for follow up appt (69mn).   I discussed the assessment and treatment plan with the patient. The patient was provided an opportunity to ask questions and all were answered. The patient agreed with the plan and demonstrated an understanding of the instructions.   The patient was advised to call back or seek an in-person evaluation if the symptoms worsen or if the condition fails to improve as anticipated.   CEinar Pheasant MD

## 2022-04-15 NOTE — Assessment & Plan Note (Signed)
Medications being adjusted - previously by neurology.  Referred to psychiatry.  Started back on zoloft.  Appears to be doing some better.

## 2022-04-15 NOTE — Assessment & Plan Note (Signed)
Continue amlodipine.  Follow pressures.  Recent kidney function 03/02/22 wnl.

## 2022-04-15 NOTE — Assessment & Plan Note (Signed)
Continue risk factor modification.  Has been tolerating statin. Continue plavix.  Discussed and she understands risk of stopping plavix for procedures.   Follow.

## 2022-04-15 NOTE — Assessment & Plan Note (Signed)
Continue crestor.  Low cholesterol diet and exercise. Follow lipid panel and liver function tests.   

## 2022-04-16 ENCOUNTER — Telehealth: Payer: Self-pay | Admitting: Internal Medicine

## 2022-04-16 ENCOUNTER — Encounter: Payer: Self-pay | Admitting: Internal Medicine

## 2022-04-16 ENCOUNTER — Telehealth: Payer: Self-pay

## 2022-04-16 DIAGNOSIS — K219 Gastro-esophageal reflux disease without esophagitis: Secondary | ICD-10-CM | POA: Diagnosis not present

## 2022-04-16 DIAGNOSIS — R350 Frequency of micturition: Secondary | ICD-10-CM | POA: Insufficient documentation

## 2022-04-16 DIAGNOSIS — Z9989 Dependence on other enabling machines and devices: Secondary | ICD-10-CM | POA: Diagnosis not present

## 2022-04-16 DIAGNOSIS — I890 Lymphedema, not elsewhere classified: Secondary | ICD-10-CM | POA: Diagnosis not present

## 2022-04-16 DIAGNOSIS — G4733 Obstructive sleep apnea (adult) (pediatric): Secondary | ICD-10-CM | POA: Diagnosis not present

## 2022-04-16 DIAGNOSIS — Z01818 Encounter for other preprocedural examination: Secondary | ICD-10-CM | POA: Diagnosis not present

## 2022-04-16 NOTE — Telephone Encounter (Signed)
LM for Denise at [place] ([facility]) for u/a , ucx resuts

## 2022-04-16 NOTE — Telephone Encounter (Signed)
Need results of urinalysis and culture collected 04/14/22.  Collected at [place] [place]

## 2022-04-16 NOTE — Telephone Encounter (Signed)
Pt's husband, Anysha Frappier (on [place]) infusion medication is not showing any effect. Wanted to let [name] know in case he wanted to increase the dosage before next infusion. Would like a call back.

## 2022-04-16 NOTE — Assessment & Plan Note (Signed)
Medications being adjusted - previously by neurology.  Referred to psychiatry.  Started back on zoloft.

## 2022-04-16 NOTE — Assessment & Plan Note (Signed)
Urine collected today to confirm no infection.  Hold abx. Await urinalysis and culture.  Discussed overactive bladder.  Hold on further medication.  Follow.

## 2022-04-16 NOTE — Telephone Encounter (Signed)
Called the husband back. Advised that Dr Felecia Shelling is aware of her concern and will be willing to do another month of the 120 mg solumedrol IV. He would not recommend increasing the dose because the long term use of high dose steroids can cause problems with bones.  Husband verbalized understanding and was appreciative for the call back.

## 2022-04-16 NOTE — Assessment & Plan Note (Signed)
Has been wearing cpap.  Continue cpap.  Follow.

## 2022-04-16 NOTE — Telephone Encounter (Signed)
faxed

## 2022-04-16 NOTE — Telephone Encounter (Signed)
Margie From [place] called in wondering if Dr. Nicki Reaper received a fax about pt surgical clearance paperwork for colonoscopy.... Margie requesting callback.Marland KitchenMarland Kitchen

## 2022-04-16 NOTE — Telephone Encounter (Signed)
Noted.  Hold until receive.

## 2022-04-16 NOTE — Assessment & Plan Note (Signed)
Followed by Dr Felecia Shelling. Planning to move to skilled nursing - PT.  Follow.

## 2022-04-16 NOTE — Telephone Encounter (Signed)
Called pulmonary.  Ms Woolen saw pulmonary today.  GI notified and notified of pts history.  See my office note.

## 2022-04-17 ENCOUNTER — Encounter
Payer: BC Managed Care – PPO | Attending: Physical Medicine and Rehabilitation | Admitting: Physical Medicine and Rehabilitation

## 2022-04-17 ENCOUNTER — Telehealth: Payer: Self-pay

## 2022-04-17 DIAGNOSIS — R252 Cramp and spasm: Secondary | ICD-10-CM | POA: Diagnosis not present

## 2022-04-17 DIAGNOSIS — W19XXXS Unspecified fall, sequela: Secondary | ICD-10-CM

## 2022-04-17 MED ORDER — SERTRALINE HCL 50 MG PO TABS: 50.0000 mg | ORAL_TABLET | Freq: Every day | ORAL | 3 refills | Status: DC

## 2022-04-17 NOTE — Telephone Encounter (Signed)
Tiana Loft called from [employer] at [place] to state that she would like to see if we have received the urinalysis results they faxed to [place] yesterday for patient.  Lawerance Bach states that patient's husband dropped off FL2 form for [place] to complete and she would like to see if it is ready to be picked up.

## 2022-04-17 NOTE — Telephone Encounter (Signed)
Placed in provider folder for signature

## 2022-04-17 NOTE — Telephone Encounter (Signed)
Patient's husband, Yamira Papa, states he is following up to see if the [facility] paper has been completed for patient.  [name] states that he will drop everything and come pick it up when it's ready, just give him a call.

## 2022-04-17 NOTE — Telephone Encounter (Signed)
Pt husband came into office to get an update for the [facility] form. Pt husband stated that he need the form ASAP

## 2022-04-17 NOTE — Progress Notes (Signed)
Subjective:    Patient ID: Carly [name], female    DOB: Mar 14, 1951, 71 y.o.   MRN: [id number]  HPI  An audio/video tele-health visit is felt to be the most appropriate encounter for this patient at this time. This is a follow up tele-visit via phone. The patient is at home. MD is at office. Prior to scheduling this appointment, our staff discussed the limitations of evaluation and management by telemedicine and the availability of in-person appointments. The patient expressed understanding and agreed to proceed.    Carly [name] is a 71 year old woman who presents for f/u of white matter periventricular CVA, presents for follow-up regarding lymphedema, anxiety,chronic fatigue, insomnia, OSA, and weight gain, and new lower extremity weakness today.    1) Anxiety:  -her psychiatrist previously transitioned her from Seroquel back to benzodiazepine due to side effect profile of Seroquel, but this worsened her anxiety so we restarted it -anxiety has been very severe, also notes severe depression -asks if she can increase seroquel to '25mg'$  -she started Zoloft this week and has not yet noted any benefit -she would like referral to Dr. Sonny Dandy who was recommended to her by [name], she has not yet heard from their office -she really likes following with [name] as well -she feels that her inability to see her grandkids is a trigger for her anxiety. She used to see them mmore in the past and does not know why she doesn;t get to any mores -she does not want to restart her [name] as she does not want the weight gain associated with this medication -she has only been sleeping 4 hours per night and she feels this worsens her anxiety.  -she has been having a lot of anxiety related to her and her husband's move from their current home into a retirement home and she asks whether she may take the seroquel additionally as needed- once in the morning, once later in the day, and two at  night -she is interested in following with a behavioral therapist -she and husband Carly [name] were scared of side effects of Seroquel that were discussed- she has not noted any this far.  -she has been sleeping but her legs feel very stiff at night -she continues to take Cymbalta '60mg'$  -we tried weaning her off the mirtazepine but she experienced weakness and insomnia and wanted to restart- titrated back up to her former dose.  -she discussed with her husband, who is also present on this call today, and she would prefer to restart Seroquel and stop Lorazepam.  -she has not had any counseling and would like to restart this. She has an appointment with [name] in August and would like to start counseling earlier than this if possible.  -she loved speaking with [name] inpatient and would like to follow with him -she had symptoms of dizziness yesterday at the hairdresser and was asked to contact me regarding if this could be from the seroquel. She had had no other similar episodes since starting it.  -she has been sleeping well with the Seroquel at night.  -she would like a list of foods that can help anxiety.  -she asks about the dietary advice I gave her last visit regarding anxiety. -she has been using essential oil.  -anxiety has been bad -off prednisone now -exercise helps her.  -she gets upset over things easily -she wants to be able to calm down.  -she needs refills of her cymbalta and [name]  2)  MS: -has been having a lot of stiffness recently -EMS called twice due to difficulty getting her up off the floor -is receiving home therapy -she would like to see a different neurologist and asked for my recommendation   3) Lymphedema  -has significant bilateral lower extremity  -has greatly improved with lymphedema therapy -she recently received a device that has helped so much that she was able to get off of Lasix.  -has ordered compression garments -does not eat processed  foods or salt.  -she has ordered something from [employer] to give her lymphatic massage.  -improving but still very bothersome to her -discussed bloodwork today  4) HTN: -BP 138/79  5) Decreased appetite:  -resolved with increasing [name]  6) She developed pain in right side, and not sure if this is due to topamax or the antibiotics she is on to treat a respiratory infection.   7) Insomnia:  -sleeping poorly due to her CPAP mask, pressure is rising at night and this is causing mask leak.  -respiratory therapist will seeing her tomorrow.  -she asks about Inspire.  -she follows with [name] and she says he is also a sleep doctor -she hates to reduce the medicine.  -she has tried 5 different masks -the machine is very loud.  -she can sleep without the CPAP fine.   8) Chest congestion -she has been having symptoms of congestion for greater than 3 months -Her father saw 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 She received a call to schedule an appointment with [name] office and is thankful for the referral.  -she asks what else she can do to help with this -she has been through multiple rounds of antibiotics without benefit.  -also feeling diffuse joint aches.   9) GERD -has been experiencing after eating  10) Sinus congestion -pleased that her CT results were normal.   11) OSA -she notes that she was found to have OSA on recent sleep study and she was tearful about this as she feels it is just another condition she has -she was surprised as she feels that she sleeps well.  -she has note been sleeping well while using the CPAP mask and was told that O2 is leaking -she is working with a respiratory therapist -she feels anxiety about whether she will get enough sleep tonight, she has only been sleeping about 4 hours since using the CPAP -she has decided she does not want to pursue Inspire yet until  she has learned more about it.  -she had a very productive visit with [name] and he advised her to try her best to tolerate the CPAP mask, which she is doing.  -followed up with pulmonology regarding [name] and was told she would be a good candidate for this treatment, but she should prefer to continue trying CPAP mask before trying Inspire  12) Chronic fatigue -she was diagnosed with COVID 9 days ago and her breathing has improved but she has been feeling very fatigued -she has been taking the N-acetyl-cysteine supplement -she asks about taking injetable B12 as this really helped her son -she has not been wearing her CPAP as she is waiting for a new mask  13) Pain -takes Tylenol and baclofen  14) Lower extremity weakness: -improved -went to ED and there was no evidence of stroke. Thought to be due to her Risperdal -her legs feel stiff -no other focal deficits -she started Risperdal on  Wednesday night and asks if this could be causing her weakness -[name] has been trying to wean her off Mirtazapine and Seroquel due to weight gain and onto Risperdal for her anxiety  15) Bilateral lower extremity pain -feels stiff and tight like spasticity -she asks if she can take extra Baclofen  16) depression -she has not yet found Zoloft to be helpful and has given it a couple of weeks -she asks if dose can be increased.   17) frequent falls -she says she is being admitted to sNF due to recent frequent falls.  Pain Inventory Average Pain 5 Pain Right Now 2 My pain is aching and no pain.  Sometimes spasm in legs at night because of MST  In the last 24 hours, has pain interfered with the following? General activity 4 Relation with others 7 Enjoyment of life 5  What TIME of day is your pain at its worst? night Sleep (in general) Fair  Pain is worse with: sitting and unsure Pain improves with: medication Relief from Meds: 5  Family History  Problem Relation Age of Onset    Arthritis Mother    Hypertension Mother    Macular degeneration Mother    Hypertension Father    Hyperlipidemia Father    Heart disease Maternal Grandfather    Diabetes Maternal Grandfather    Kidney disease Paternal Grandmother    Social History   Socioeconomic History   Marital status: Married    Spouse name: Engineer, technical sales   Number of children: 3   Years of education: 14   Highest education level: Associate degree: academic program  Occupational History   Not on file  Tobacco Use   Smoking status: Never   Smokeless tobacco: Never  Vaping Use   Vaping Use: Never used  Substance and Sexual Activity   Alcohol use: No    Alcohol/week: 0.0 standard drinks of alcohol   Drug use: No   Sexual activity: Not Currently  Other Topics Concern   Not on file  Social History Narrative   Lives with husband    Right handed   Caffeine: 1 cup of coffee in the AM, coke occa. Trying to come off caffeine    Social Determinants of Health   Financial Resource Strain: Low Risk  (01/07/2022)   Overall Financial Resource Strain (CARDIA)    Difficulty of Paying Living Expenses: Not hard at all  Food Insecurity: No Food Insecurity (01/07/2022)   Hunger Vital Sign    Worried About Running Out of Food in the Last Year: Never true    Ran Out of Food in the Last Year: Never true  Transportation Needs: No Transportation Needs (01/07/2022)   PRAPARE - Hydrologist (Medical): No    Lack of Transportation (Non-Medical): No  Physical Activity: Insufficiently Active (01/07/2022)   Exercise Vital Sign    Days of Exercise per Week: 2 days    Minutes of Exercise per Session: 60 min  Stress: No Stress Concern Present (01/07/2022)   [place]    Feeling of Stress : Not at all  Social Connections: Unknown (01/07/2022)   Social Connection and Isolation Panel [NHANES]    Frequency of Communication with Friends and Family: Not  on file    Frequency of Social Gatherings with Friends and Family: Not on file    Attends Religious Services: Not on file    Active Member of Clubs or Organizations: Not on file  Attends Archivist Meetings: Not on file    Marital Status: Married   Past Surgical History:  Procedure Laterality Date   BACK SURGERY     CHOLECYSTECTOMY     COLONOSCOPY WITH PROPOFOL N/A 05/18/2017   Procedure: COLONOSCOPY WITH PROPOFOL;  Surgeon: [name], MD;  Location: [facility] ENDOSCOPY;  Service: Endoscopy;  Laterality: N/A;   FOOT SURGERY  2015   GALLBLADDER SURGERY  2008   HARDWARE REMOVAL Left 02/14/2016   Procedure: LEFT FOOT REMOVAL DEEP IMPLANT;  Surgeon: [name], MD;  Location: [place];  Service: Orthopedics;  Laterality: Left;   HERNIA REPAIR     Inguinal Hernia Repair   SPINE SURGERY  2014   Past Surgical History:  Procedure Laterality Date   BACK SURGERY     CHOLECYSTECTOMY     COLONOSCOPY WITH PROPOFOL N/A 05/18/2017   Procedure: COLONOSCOPY WITH PROPOFOL;  Surgeon: [name], MD;  Location: [facility] ENDOSCOPY;  Service: Endoscopy;  Laterality: N/A;   FOOT SURGERY  2015   GALLBLADDER SURGERY  2008   HARDWARE REMOVAL Left 02/14/2016   Procedure: LEFT FOOT REMOVAL DEEP IMPLANT;  Surgeon: [name], MD;  Location: [place];  Service: Orthopedics;  Laterality: Left;   HERNIA REPAIR     Inguinal Hernia Repair   SPINE SURGERY  2014   Past Medical History:  Diagnosis Date   Allergy    Anxiety    Aspiration pneumonia (HCC)    Depression    Frequent headaches    H/O   GERD (gastroesophageal reflux disease)    History of chicken pox    History of colon polyps    Hx of migraines    Hypertension    Lymphedema    Multiple sclerosis ([place]) 2011   OSA (obstructive sleep apnea)    PONV (postoperative nausea and vomiting)    Scoliosis    There were no vitals taken for this visit.  Opioid Risk Score:   Fall Risk Score:   `1  Depression screen PHQ 2/9     04/09/2022   10:00 AM 01/07/2022    3:15 PM 11/05/2021   10:10 AM 08/27/2021   11:12 AM 08/15/2021   10:50 AM 07/19/2021    3:28 PM 06/18/2021    9:24 AM  Depression screen PHQ 2/9  Decreased Interest 1 0 0 2 0 1 3  Down, Depressed, Hopeless 1 0 1 2 0 1 3  PHQ - 2 Score 2 0 1 4 0 2 6  Altered sleeping 0        Tired, decreased energy 1        Change in appetite 0        Feeling bad or failure about yourself  0        Trouble concentrating 0        Moving slowly or fidgety/restless 0        Suicidal thoughts 0        PHQ-9 Score 3        Difficult doing work/chores Not difficult at all          Review of Systems  Constitutional: Negative.   HENT: Negative.    Eyes: Negative.   Respiratory:  Negative for cough.        Chest congestion  Cardiovascular: Negative.   Gastrointestinal: Negative.   Endocrine: Negative.   Genitourinary: Negative.   Musculoskeletal:  Positive for back pain and gait problem.  Skin: Negative.  Allergic/Immunologic: Negative.   Hematological:  Bruises/bleeds easily.       Plavix  Psychiatric/Behavioral:  Positive for dysphoric mood.   All other systems reviewed and are negative.      Objective:   Physical Exam Patient seen via phone     Assessment & Plan:  1) Anxiety: -discussed with patient the side effects of both Seroquel and Lorazepam, as well as potential interactions with other medications. -discussed referral to psychiatry, placed referral to Dr. Sonny Dandy who was recommended to her by [name], discussed that it may be at least a week before she hears from them. -continue Zoloft, discussed that this will take 2-4 weeks to have effect, reinforced that she may not feel benefit from this yet.  -discussed benefits of exercise and good nutrition -discussed her move because it is stressing her.  -discussed whether we should increase Seroquel due to her anxiety. Increase Seroquel to '25mg'$  daily  prn -discussed about her stress of seeing many different doctors -advised to always use the lowest dose of medications that she needs to minimize side effects -discussed the buspar that was started for her by Dr. Kerman Passey- she has not yet noted benefits from this, advised that she does not need to take it if she does not feel benefits.  -referred to psychiatry in [facility] for CBT -reinforced importance of thinking positively.  -continue to encourage non-pharmacologic management approaches as well, such as meditation, applying lavender oil, and exercise- all of which she has been trying and which have been helping.  -discussed alternative anxiety medications.  -continue seroquel 12.'5mg'$  daily and '25mg'$  at night. Can decreased to '25mg'$  at night and assess positive/negative effects during the day. Can restart 1/2 tab during the day if anxiety is uncontrolled.  -discontinue lorazepam.  -establish care with [name] for neuropsych counseling on 8/2. They really valued the appointment with him.  -discontinue topamax in case pain in right side is due to medication- discussed can also be due to antibiotics which she is taking for respiratory infection- discontinuing the topamax will help [place] know if it is the cause of her pain or not. -refilled [name] '30mg'$  HS.  -refilled Cymbalta '60mg'$  -Discussed exercise and meditation as tools to decrease anxiety. -Recommended Down Dog Yoga app -Discussed spending time outdoors. -Discussed positive re-framing of anxiety.  -Discussed the following foods that have been show to reduce anxiety: 1) [place] nuts, mushrooms, soy beans due to their high selenium content. Upper limit of toxicity of selenium is 437mg/day so no more than 3-4 bBolivianuts per day.  2) Fatty fish such as salmon, mackerel, sardines, trout, and herring- high in omega-3 fatty acids 3) Eggs- increases serotonin and dopamine 4) Pumpkin seeds- high in omega-3 fatty acids 5) dark chocolate- high in  flavanols that increase blood flow to brain 6) turmeric- take with black pepper to increase absorption 7) chamomile tea- antioxidant and anti-inflammatory properties 8) yogurt without sugar- supports gut-brain axis 9) green tea- contains L- theanine 10) blueberries- high in vitamin C and antioxidants 11) t[place] high in tryptophan which gets converted to serotonin 12) bell peppers- rich in vitamin C and antioxidants 13) citrus fruits- rich in vitamin C and antioxidants 14) almonds- high in vitamin E and healthy fats 15) chia seeds- high in omega-3 fatty acids  2) MS -continue home therapy to minimize stiffness/maximize mobility -f/u with MS specialist. Discussed her appointmenr with Dr. SKerman Passey  -provided with neurology referral -discussed trying lipoic acid  3) HTN:  -discussed good blood pressure  today.  BP has been 130s-150s/80s, recommended citrus foods and nuts, as much mobility as she can tolerate, log Bps daily and bring log to f/u appointment.  -continue current regimen and checking and checking BP.  -advised citrus foods and nuts to help lower BP. Discussed that once BP is consistently 120/80 we can wean her Losartan to '25mg'$ .   4) Bilateral lower extremity edema: - continue lymphedema therapy which is greatly helping.  -Lasix discontinued. Discussed that this should also improve her kidney function -advised low salt diet.  -prescribed new compression garments, will fax to company.  -discussed doing the compression garments early in the morning at 9am  -discussed that her kidney function is normal on last check in March.   5) Insomnia: -recommended tart cherry juice with dinner, valerian root and chamomile teas in evening, applying lavender drops to forehead at night. -continue [name] '30mg'$  HS- discussed that we will not try to wean off again since we saw how much this is benefittng her -discontinue amantadine.  -discussed considering the Inspire trial since he has  tried the CPAP for a long time now and can not tolerate it.   6) Obesity BMI 33.13 -attempted to wean off [name] but she developed worsening insomnia, anxiety, and decreased appetite that was undesirable to her, so have restarted the medication.  -discussed trial of avoiding gluten for 3 months and how this could potentially help with her weight loss, anxiety, and prevention/slowing of her chronic diseases. -discussed benefits of intermittent fasting- made goal to set a consistent dinner time and avoid snacking after this time. If she is hungry, choose a healthy snack of nuts, fruit, or vegetables.  -provided a link to a pdf of Pete Escogue's musculoskeletal alignment exercises: [email or web address]   7) Chest congestion -referred for pulmonary consult with [name], who treated her father for pulmonary fibrosis. She has made an appointment to establish care with him.  -recommended drinking daily warm water with honey, lime and ginger -recommended drinking tea with holy Basil (Tulsi)  8) Sore throat: -continue salt water gargling  9) Sinus congestion: -recommended humidifier -drink 6-8 glasses of water per day.   10) fatigue -checked B12, folate, TSH, vitamin D, magnesium and discussed bloodwork with patient and husband: all in normal range except for suboptimal Vitamin D- high dose supplement prescribed -Recommended continuing NAC (N-Acetyl cysteine) '600mg'$  BID for her fatigue. Discussed its benefits in boosting glutathione and mitochondrial function. -prescribed monthly injectable B12 as patient would like to try this. Discussed that there are few risks to having elevated B12 levels -discussed that not wearing her CPAP is also likely contributing to her fatigue -discussed modafinil but she prefers to try the B12 first   11) OSA -discussed that OSA can contribute to daytime fatigue, weight gain, anxiety, and chronic  conditions so it is good to get this condition treated. -referred to Dr. Melida Quitter for eval for [facility] given that she is having a hard time sleeping with her CPAP. Discussed her decision not to pursue Inspire at this time and instead try to tolerate her current mask as best as possible. Discussed her positive conversation with [name]. -advised her to talk to her respiratory therapist about fitting another mask -discussed Inspire but she says unfortunately it is not covered by insurance.  -discussed strap under the chin.  -discussed following up with pulmonology for Inspire.  -agreed with her decision to continue to try CPAP before pursuing Inspire due to her fears of the procedure  12) Low back pain: -recommended doTerra Deep Blue Essential oil and applied to area of pain today- discussed that this is made of natural plant oils. Shared by personal experience of benefit from use of this essential oil -provided a link to a pdf of Pete Escogue's musculoskeletal alignment exercises: [email or web address]   13) Lower extremity weakness: -discussed that this could be a possible side effect of the Risperdal.  -Recommended calling Dr. Garth Bigness office to let him know about the lower extremity weakness -discussed that she should go to the ED as unilateral lower extremity weakness could be a symptom of stroke or MS flare -discussed stopping Risperdal until she can talk with [name] -Discussed using Seroquel prn for anxiety in the meantime  14) Depression -prescribed Zoloft which she has had good response to in the past.  -encouraged that she call her kids to let them know how important it is to her to be able to see her grandchildren  52) Lower extremity spasticity -recommended heat, range of motion, use of baclofen -asked her to leet me know if does not improve and can consider Botox next visit  5 minutes spent in discussion of her  anxiety, increasing seroquel to '25mg'$  daily prn, exercise for anxiety, meditation

## 2022-04-17 NOTE — Telephone Encounter (Signed)
Notify - urine ok.  Culture negative.  FL2 form placed in box to clarify medication.

## 2022-04-18 ENCOUNTER — Encounter (HOSPITAL_BASED_OUTPATIENT_CLINIC_OR_DEPARTMENT_OTHER): Payer: BC Managed Care – PPO | Admitting: Physical Medicine and Rehabilitation

## 2022-04-18 ENCOUNTER — Other Ambulatory Visit: Payer: Self-pay | Admitting: Physical Medicine and Rehabilitation

## 2022-04-18 DIAGNOSIS — R112 Nausea with vomiting, unspecified: Secondary | ICD-10-CM | POA: Diagnosis not present

## 2022-04-18 DIAGNOSIS — F332 Major depressive disorder, recurrent severe without psychotic features: Secondary | ICD-10-CM

## 2022-04-18 DIAGNOSIS — R296 Repeated falls: Secondary | ICD-10-CM

## 2022-04-18 MED ORDER — ONDANSETRON HCL 4 MG PO TABS: 4.0000 mg | ORAL_TABLET | Freq: Three times a day (TID) | ORAL | 0 refills | Status: DC | PRN

## 2022-04-18 NOTE — Progress Notes (Addendum)
Subjective:    Patient ID: Carly [name], female    DOB: August 07, 1951, 71 y.o.   MRN: [id number]  HPI  An audio/video tele-health visit is felt to be the most appropriate encounter for this patient at this time. This is a follow up tele-visit via phone. The patient is at home. MD is at office. Prior to scheduling this appointment, our staff discussed the limitations of evaluation and management by telemedicine and the availability of in-person appointments. The patient expressed understanding and agreed to proceed.    Carly [name] is a 71 year old woman who presents for f/u of white matter periventricular CVA, presents for follow-up regarding lymphedema, anxiety,chronic fatigue, insomnia, OSA, and weight gain, and new lower extremity weakness today.    1) Anxiety:  -her psychiatrist previously transitioned her from Seroquel back to benzodiazepine due to side effect profile of Seroquel, but this worsened her anxiety so we restarted it -anxiety has been very severe -she started Zoloft this week and has not yet noted any benefit -she would like referral to Dr. Sonny Dandy who was recommended to her by [name], she has not yet heard from their office -she really likes following with [name] as well -she feels that her inability to see her grandkids is a trigger for her anxiety. She used to see them mmore in the past and does not know why she doesn;t get to any mores -she does not want to restart her [name] as she does not want the weight gain associated with this medication -she has only been sleeping 4 hours per night and she feels this worsens her anxiety.  -she has been having a lot of anxiety related to her and her husband's move from their current home into a retirement home and she asks whether she may take the seroquel additionally as needed- once in the morning, once later in the day, and two at night -she is interested in following with a behavioral therapist -she and  husband [name] were scared of side effects of Seroquel that were discussed- she has not noted any this far.  -she has been sleeping but her legs feel very stiff at night -she continues to take Cymbalta '60mg'$  -we tried weaning her off the mirtazepine but she experienced weakness and insomnia and wanted to restart- titrated back up to her former dose.  -she discussed with her husband, who is also present on this call today, and she would prefer to restart Seroquel and stop Lorazepam.  -she has not had any counseling and would like to restart this. She has an appointment with [name] in August and would like to start counseling earlier than this if possible.  -she loved speaking with [name] inpatient and would like to follow with him -she had symptoms of dizziness yesterday at the hairdresser and was asked to contact me regarding if this could be from the seroquel. She had had no other similar episodes since starting it.  -she has been sleeping well with the Seroquel at night.  -she would like a list of foods that can help anxiety.  -she asks about the dietary advice I gave her last visit regarding anxiety. -she has been using essential oil.  -anxiety has been bad -off prednisone now -exercise helps her.  -she gets upset over things easily -she wants to be able to calm down.  -she needs refills of her cymbalta and [name]  2) MS: -has been having a lot of stiffness recently -EMS called twice  due to difficulty getting her up off the floor -is receiving home therapy -she would like to see a different neurologist and asked for my recommendation   3) Lymphedema  -has significant bilateral lower extremity  -has greatly improved with lymphedema therapy -she recently received a device that has helped so much that she was able to get off of Lasix.  -has ordered compression garments -does not eat processed foods or salt.  -she has ordered something from [employer] to give her lymphatic  massage.  -improving but still very bothersome to her -discussed bloodwork today  4) HTN: -BP 138/79  5) Decreased appetite:  -resolved with increasing [name]  6) She developed pain in right side, and not sure if this is due to topamax or the antibiotics she is on to treat a respiratory infection.   7) Insomnia:  -sleeping poorly due to her CPAP mask, pressure is rising at night and this is causing mask leak.  -respiratory therapist will seeing her tomorrow.  -she asks about Inspire.  -she follows with [name] and she says he is also a sleep doctor -she hates to reduce the medicine.  -she has tried 5 different masks -the machine is very loud.  -she can sleep without the CPAP fine.   8) Chest congestion -she has been having symptoms of congestion for greater than 3 months -Her father saw 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 She received a call to schedule an appointment with [name] office and is thankful for the referral.  -she asks what else she can do to help with this -she has been through multiple rounds of antibiotics without benefit.  -also feeling diffuse joint aches.   9) GERD -has been experiencing after eating  10) Sinus congestion -pleased that her CT results were normal.   11) OSA -she notes that she was found to have OSA on recent sleep study and she was tearful about this as she feels it is just another condition she has -she was surprised as she feels that she sleeps well.  -she has note been sleeping well while using the CPAP mask and was told that O2 is leaking -she is working with a respiratory therapist -she feels anxiety about whether she will get enough sleep tonight, she has only been sleeping about 4 hours since using the CPAP -she has decided she does not want to pursue Inspire yet until she has learned more about it.  -she had a very productive visit with Dr.  Sima Matas and he advised her to try her best to tolerate the CPAP mask, which she is doing.  -followed up with pulmonology regarding [name] and was told she would be a good candidate for this treatment, but she should prefer to continue trying CPAP mask before trying Inspire  12) Chronic fatigue -she was diagnosed with COVID 9 days ago and her breathing has improved but she has been feeling very fatigued -she has been taking the N-acetyl-cysteine supplement -she asks about taking injetable B12 as this really helped her son -she has not been wearing her CPAP as she is waiting for a new mask  13) Pain -takes Tylenol and baclofen  14) Lower extremity weakness: -improved -went to ED and there was no evidence of stroke. Thought to be due to her Risperdal -her legs feel stiff -no other focal deficits -she started Risperdal on Wednesday night and asks if this could be causing her weakness -Dr.  Sater has been trying to wean her off Mirtazapine and Seroquel due to weight gain and onto Risperdal for her anxiety  15) Bilateral lower extremity pain -feels stiff and tight like spasticity -she asks if she can take extra Baclofen  16) depression -she has not yet found Zoloft to be helpful and has given it a couple of weeks -she asks if dose can be increased.   17) frequent falls -she says she is being admitted to sNF due to recent frequent falls.  18) Nausea -needs refill of Zofran  Pain Inventory Average Pain 5 Pain Right Now 2 My pain is aching and no pain.  Sometimes spasm in legs at night because of MST  In the last 24 hours, has pain interfered with the following? General activity 4 Relation with others 7 Enjoyment of life 5  What TIME of day is your pain at its worst? night Sleep (in general) Fair  Pain is worse with: sitting and unsure Pain improves with: medication Relief from Meds: 5  Family History  Problem Relation Age of Onset   Arthritis Mother    Hypertension  Mother    Macular degeneration Mother    Hypertension Father    Hyperlipidemia Father    Heart disease Maternal Grandfather    Diabetes Maternal Grandfather    Kidney disease Paternal Grandmother    Social History   Socioeconomic History   Marital status: Married    Spouse name: Engineer, technical sales   Number of children: 3   Years of education: 14   Highest education level: Associate degree: academic program  Occupational History   Not on file  Tobacco Use   Smoking status: Never   Smokeless tobacco: Never  Vaping Use   Vaping Use: Never used  Substance and Sexual Activity   Alcohol use: No    Alcohol/week: 0.0 standard drinks of alcohol   Drug use: No   Sexual activity: Not Currently  Other Topics Concern   Not on file  Social History Narrative   Lives with husband    Right handed   Caffeine: 1 cup of coffee in the AM, coke occa. Trying to come off caffeine    Social Determinants of Health   Financial Resource Strain: Low Risk  (01/07/2022)   Overall Financial Resource Strain (CARDIA)    Difficulty of Paying Living Expenses: Not hard at all  Food Insecurity: No Food Insecurity (01/07/2022)   Hunger Vital Sign    Worried About Running Out of Food in the Last Year: Never true    Ran Out of Food in the Last Year: Never true  Transportation Needs: No Transportation Needs (01/07/2022)   PRAPARE - Hydrologist (Medical): No    Lack of Transportation (Non-Medical): No  Physical Activity: Insufficiently Active (01/07/2022)   Exercise Vital Sign    Days of Exercise per Week: 2 days    Minutes of Exercise per Session: 60 min  Stress: No Stress Concern Present (01/07/2022)   [place]    Feeling of Stress : Not at all  Social Connections: Unknown (01/07/2022)   Social Connection and Isolation Panel [NHANES]    Frequency of Communication with Friends and Family: Not on file    Frequency of Social  Gatherings with Friends and Family: Not on file    Attends Religious Services: Not on file    Active Member of Clubs or Organizations: Not on file    Attends Club  or Organization Meetings: Not on file    Marital Status: Married   Past Surgical History:  Procedure Laterality Date   BACK SURGERY     CHOLECYSTECTOMY     COLONOSCOPY WITH PROPOFOL N/A 05/18/2017   Procedure: COLONOSCOPY WITH PROPOFOL;  Surgeon: [name], MD;  Location: [facility] ENDOSCOPY;  Service: Endoscopy;  Laterality: N/A;   FOOT SURGERY  2015   GALLBLADDER SURGERY  2008   HARDWARE REMOVAL Left 02/14/2016   Procedure: LEFT FOOT REMOVAL DEEP IMPLANT;  Surgeon: [name], MD;  Location: [place];  Service: Orthopedics;  Laterality: Left;   HERNIA REPAIR     Inguinal Hernia Repair   SPINE SURGERY  2014   Past Surgical History:  Procedure Laterality Date   BACK SURGERY     CHOLECYSTECTOMY     COLONOSCOPY WITH PROPOFOL N/A 05/18/2017   Procedure: COLONOSCOPY WITH PROPOFOL;  Surgeon: [name], MD;  Location: [facility] ENDOSCOPY;  Service: Endoscopy;  Laterality: N/A;   FOOT SURGERY  2015   GALLBLADDER SURGERY  2008   HARDWARE REMOVAL Left 02/14/2016   Procedure: LEFT FOOT REMOVAL DEEP IMPLANT;  Surgeon: [name], MD;  Location: [place];  Service: Orthopedics;  Laterality: Left;   HERNIA REPAIR     Inguinal Hernia Repair   SPINE SURGERY  2014   Past Medical History:  Diagnosis Date   Allergy    Anxiety    Aspiration pneumonia (HCC)    Depression    Frequent headaches    H/O   GERD (gastroesophageal reflux disease)    History of chicken pox    History of colon polyps    Hx of migraines    Hypertension    Lymphedema    Multiple sclerosis ([place]) 2011   OSA (obstructive sleep apnea)    PONV (postoperative nausea and vomiting)    Scoliosis    There were no vitals taken for this visit.  Opioid Risk Score:   Fall Risk Score:  `1  Depression screen PHQ 2/9      04/09/2022   10:00 AM 01/07/2022    3:15 PM 11/05/2021   10:10 AM 08/27/2021   11:12 AM 08/15/2021   10:50 AM 07/19/2021    3:28 PM 06/18/2021    9:24 AM  Depression screen PHQ 2/9  Decreased Interest 1 0 0 2 0 1 3  Down, Depressed, Hopeless 1 0 1 2 0 1 3  PHQ - 2 Score 2 0 1 4 0 2 6  Altered sleeping 0        Tired, decreased energy 1        Change in appetite 0        Feeling bad or failure about yourself  0        Trouble concentrating 0        Moving slowly or fidgety/restless 0        Suicidal thoughts 0        PHQ-9 Score 3        Difficult doing work/chores Not difficult at all          Review of Systems  Constitutional: Negative.   HENT: Negative.    Eyes: Negative.   Respiratory:  Negative for cough.        Chest congestion  Cardiovascular: Negative.   Gastrointestinal: Negative.   Endocrine: Negative.   Genitourinary: Negative.   Musculoskeletal:  Positive for back pain and gait problem.  Skin: Negative.   Allergic/Immunologic:  Negative.   Hematological:  Bruises/bleeds easily.       Plavix  Psychiatric/Behavioral:  Positive for dysphoric mood.   All other systems reviewed and are negative.      Objective:   Physical Exam Patient seen via phone     Assessment & Plan:  1) Anxiety: -discussed with patient the side effects of both Seroquel and Lorazepam, as well as potential interactions with other medications. -discussed referral to psychiatry, placed referral to Dr. Sonny Dandy who was recommended to her by [name], discussed that it may be at least a week before she hears from them. -continue Zoloft, discussed that this will take 2-4 weeks to have effect, reinforced that she may not feel benefit from this yet.  -discussed benefits of exercise and good nutrition -discussed her move because it is stressing her.  -discussed whether we should increase Seroquel due to her anxiety. Discussed that she is currently taking 12.'5mg'$  3 times per day PRN -discussed  about her stress of seeing many different doctors -advised to always use the lowest dose of medications that she needs to minimize side effects -discussed the buspar that was started for her by Dr. Kerman Passey- she has not yet noted benefits from this, advised that she does not need to take it if she does not feel benefits.  -referred to psychiatry in [facility] for CBT -reinforced importance of thinking positively.  -continue to encourage non-pharmacologic management approaches as well, such as meditation, applying lavender oil, and exercise- all of which she has been trying and which have been helping.  -discussed alternative anxiety medications.  -continue seroquel 12.'5mg'$  daily and '25mg'$  at night. Can decreased to '25mg'$  at night and assess positive/negative effects during the day. Can restart 1/2 tab during the day if anxiety is uncontrolled.  -discontinue lorazepam.  -establish care with [name] for neuropsych counseling on 8/2. They really valued the appointment with him.  -discontinue topamax in case pain in right side is due to medication- discussed can also be due to antibiotics which she is taking for respiratory infection- discontinuing the topamax will help [place] know if it is the cause of her pain or not. -refilled [name] '30mg'$  HS.  -refilled Cymbalta '60mg'$  -Discussed exercise and meditation as tools to decrease anxiety. -Recommended Down Dog Yoga app -Discussed spending time outdoors. -Discussed positive re-framing of anxiety.  -Discussed the following foods that have been show to reduce anxiety: 1) [place] nuts, mushrooms, soy beans due to their high selenium content. Upper limit of toxicity of selenium is 428mg/day so no more than 3-4 bBolivianuts per day.  2) Fatty fish such as salmon, mackerel, sardines, trout, and herring- high in omega-3 fatty acids 3) Eggs- increases serotonin and dopamine 4) Pumpkin seeds- high in omega-3 fatty acids 5) dark chocolate- high in flavanols that  increase blood flow to brain 6) turmeric- take with black pepper to increase absorption 7) chamomile tea- antioxidant and anti-inflammatory properties 8) yogurt without sugar- supports gut-brain axis 9) green tea- contains L- theanine 10) blueberries- high in vitamin C and antioxidants 11) t[place] high in tryptophan which gets converted to serotonin 12) bell peppers- rich in vitamin C and antioxidants 13) citrus fruits- rich in vitamin C and antioxidants 14) almonds- high in vitamin E and healthy fats 15) chia seeds- high in omega-3 fatty acids  2) MS -continue home therapy to minimize stiffness/maximize mobility -f/u with MS specialist. Discussed her appointmenr with Dr. SKerman Passey  -provided with neurology referral -discussed trying lipoic acid  3) HTN:  -  discussed good blood pressure today.  BP has been 130s-150s/80s, recommended citrus foods and nuts, as much mobility as she can tolerate, log Bps daily and bring log to f/u appointment.  -continue current regimen and checking and checking BP.  -advised citrus foods and nuts to help lower BP. Discussed that once BP is consistently 120/80 we can wean her Losartan to '25mg'$ .   4) Bilateral lower extremity edema: - continue lymphedema therapy which is greatly helping.  -Lasix discontinued. Discussed that this should also improve her kidney function -advised low salt diet.  -prescribed new compression garments, will fax to company.  -discussed doing the compression garments early in the morning at 9am  -discussed that her kidney function is normal on last check in March.   5) Insomnia: -recommended tart cherry juice with dinner, valerian root and chamomile teas in evening, applying lavender drops to forehead at night. -continue [name] '30mg'$  HS- discussed that we will not try to wean off again since we saw how much this is benefittng her -discontinue amantadine.  -discussed considering the Inspire trial since he has tried the CPAP for  a long time now and can not tolerate it.   6) Obesity BMI 33.13 -attempted to wean off [name] but she developed worsening insomnia, anxiety, and decreased appetite that was undesirable to her, so have restarted the medication.  -discussed trial of avoiding gluten for 3 months and how this could potentially help with her weight loss, anxiety, and prevention/slowing of her chronic diseases. -discussed benefits of intermittent fasting- made goal to set a consistent dinner time and avoid snacking after this time. If she is hungry, choose a healthy snack of nuts, fruit, or vegetables.  -provided a link to a pdf of Pete Escogue's musculoskeletal alignment exercises: [email or web address]   7) Chest congestion -referred for pulmonary consult with [name], who treated her father for pulmonary fibrosis. She has made an appointment to establish care with him.  -recommended drinking daily warm water with honey, lime and ginger -recommended drinking tea with holy Basil (Tulsi)  8) Sore throat: -continue salt water gargling  9) Sinus congestion: -recommended humidifier -drink 6-8 glasses of water per day.   10) fatigue -checked B12, folate, TSH, vitamin D, magnesium and discussed bloodwork with patient and husband: all in normal range except for suboptimal Vitamin D- high dose supplement prescribed -Recommended continuing NAC (N-Acetyl cysteine) '600mg'$  BID for her fatigue. Discussed its benefits in boosting glutathione and mitochondrial function. -prescribed monthly injectable B12 as patient would like to try this. Discussed that there are few risks to having elevated B12 levels -discussed that not wearing her CPAP is also likely contributing to her fatigue -discussed modafinil but she prefers to try the B12 first   11) OSA -discussed that OSA can contribute to daytime fatigue, weight gain, anxiety, and chronic conditions so it is  good to get this condition treated. -referred to Dr. Melida Quitter for eval for [facility] given that she is having a hard time sleeping with her CPAP. Discussed her decision not to pursue Inspire at this time and instead try to tolerate her current mask as best as possible. Discussed her positive conversation with [name]. -advised her to talk to her respiratory therapist about fitting another mask -discussed Inspire but she says unfortunately it is not covered by insurance.  -discussed strap under the chin.  -discussed following up with pulmonology for Inspire.  -agreed with her decision to continue to try CPAP before pursuing Inspire due to her fears  of the procedure  12) Low back pain: -recommended doTerra Deep Blue Essential oil and applied to area of pain today- discussed that this is made of natural plant oils. Shared by personal experience of benefit from use of this essential oil -provided a link to a pdf of Pete Escogue's musculoskeletal alignment exercises: [email or web address]   13) Lower extremity weakness: -discussed that this could be a possible side effect of the Risperdal.  -Recommended calling Dr. Garth Bigness office to let him know about the lower extremity weakness -discussed that she should go to the ED as unilateral lower extremity weakness could be a symptom of stroke or MS flare -discussed stopping Risperdal until she can talk with [name] -Discussed using Seroquel prn for anxiety in the meantime  14) Depression -prescribed Zoloft which she has had good response to in the past. Increase dose to '50mg'$ . Discussed that it can take up to 4 weeks to have effect -encouraged that she call her kids to let them know how important it is to her to be able to see her grandchildren  54) Lower extremity spasticity -recommended heat, range of motion, use of baclofen -asked her to leet me know if does not improve and can consider  Botox next visit  16) Frequent falls -discussed that her facility is planning to send her to her facility's SNF -discussed that she will need to have NFL2 form filled by her PCP -discussed that the rehab and exercise she gets there will also help with her depression  17) Nausea -refilled zofran  5 minutes spent in discussion of depression, increasing Zoloft dose, frequent falls, that her facility wants to send her to her facility's SNF, that she would have to contact her PCP to help fill out the NFL2 form in order to be admitted, nausea and refilling zofran

## 2022-04-18 NOTE — Addendum Note (Signed)
Addended by: [name] on: 04/18/2022 01:00 PM   Modules accepted: Orders

## 2022-04-21 ENCOUNTER — Other Ambulatory Visit: Payer: Self-pay | Admitting: Physical Medicine and Rehabilitation

## 2022-04-21 ENCOUNTER — Telehealth: Payer: Self-pay

## 2022-04-21 MED ORDER — QUETIAPINE FUMARATE 25 MG PO TABS: 25.0000 mg | ORAL_TABLET | Freq: Every day | ORAL | 3 refills | Status: DC

## 2022-04-21 NOTE — Telephone Encounter (Signed)
Patient's husband, Vianna Venezia, called to state he is checking to see if patient's FL2 form has been completed.  [name] states this form is needed to move patient to skilled care at [facility] at [place].  [name] states he isn't trying to bug [place], but patient is having to sleep in a recliner because he can't lift her to get her in and out of the bed, so he would like to have the form completed as soon as possible.  Bill requested we call him when form is ready to be picked up.

## 2022-04-21 NOTE — Telephone Encounter (Signed)
I was unable to edit previous message regarding this form in [place].

## 2022-04-21 NOTE — Telephone Encounter (Signed)
Pt called in requesting update on FL2 Forms... Pt requesting callback

## 2022-04-22 ENCOUNTER — Telehealth: Payer: Self-pay

## 2022-04-22 ENCOUNTER — Encounter (HOSPITAL_BASED_OUTPATIENT_CLINIC_OR_DEPARTMENT_OTHER): Payer: BC Managed Care – PPO | Admitting: Physical Medicine and Rehabilitation

## 2022-04-22 DIAGNOSIS — R252 Cramp and spasm: Secondary | ICD-10-CM | POA: Diagnosis not present

## 2022-04-22 DIAGNOSIS — F418 Other specified anxiety disorders: Secondary | ICD-10-CM

## 2022-04-22 NOTE — Telephone Encounter (Signed)
Patient's husband, Elaisha Zahniser, states he is returning our call regarding clarification of patient's medication.

## 2022-04-22 NOTE — Telephone Encounter (Signed)
S/w pt husband - advised requested pill pak list, will confirm meds with list and have paperwork ready as soon as we can

## 2022-04-22 NOTE — Progress Notes (Signed)
Subjective:    Patient ID: Carly [name], female    DOB: September 04, 1951, 71 y.o.   MRN: [id number]  HPI  An audio/video tele-health visit is felt to be the most appropriate encounter for this patient at this time. This is a follow up tele-visit via phone. The patient is at home. MD is at office. Prior to scheduling this appointment, our staff discussed the limitations of evaluation and management by telemedicine and the availability of in-person appointments. The patient expressed understanding and agreed to proceed.    Carly [name] is a 71 year old woman who presents for f/u of white matter periventricular CVA, presents for follow-up regarding lymphedema, anxiety,chronic fatigue, insomnia, OSA, and weight gain, and new lower extremity weakness today.    1) Anxiety:  -her psychiatrist previously transitioned her from Seroquel back to benzodiazepine due to side effect profile of Seroquel, but this worsened her anxiety so we restarted it. She asks if she can increase Seroquel to '25mg'$  -anxiety has been very severe -she started Zoloft this week and has not yet noted any benefit -she would like referral to Dr. Sonny Dandy who was recommended to her by [name], she has not yet heard from their office -she really likes following with [name] as well -she feels that her inability to see her grandkids is a trigger for her anxiety. She used to see them mmore in the past and does not know why she doesn;t get to any mores -she does not want to restart her [name] as she does not want the weight gain associated with this medication -she has only been sleeping 4 hours per night and she feels this worsens her anxiety.  -she has been having a lot of anxiety related to her and her husband's move from their current home into a retirement home and she asks whether she may take the seroquel additionally as needed- once in the morning, once later in the day, and two at night -she is interested in  following with a behavioral therapist -she and husband Carly [name] were scared of side effects of Seroquel that were discussed- she has not noted any this far.  -she has been sleeping but her legs feel very stiff at night -she continues to take Cymbalta '60mg'$  -we tried weaning her off the mirtazepine but she experienced weakness and insomnia and wanted to restart- titrated back up to her former dose.  -she discussed with her husband, who is also present on this call today, and she would prefer to restart Seroquel and stop Lorazepam.  -she has not had any counseling and would like to restart this. She has an appointment with [name] in August and would like to start counseling earlier than this if possible.  -she loved speaking with [name] inpatient and would like to follow with him -she had symptoms of dizziness yesterday at the hairdresser and was asked to contact me regarding if this could be from the seroquel. She had had no other similar episodes since starting it.  -she has been sleeping well with the Seroquel at night.  -she would like a list of foods that can help anxiety.  -she asks about the dietary advice I gave her last visit regarding anxiety. -she has been using essential oil.  -anxiety has been bad -off prednisone now -exercise helps her.  -she gets upset over things easily -she wants to be able to calm down.  -she needs refills of her cymbalta and [name]  2) MS: -has been  having a lot of stiffness recently, we had discssed increasing the baclofen and this does appear to have helped -EMS called twice due to difficulty getting her up off the floor -is receiving home therapy -she would like to see a different neurologist and asked for my recommendation   3) Lymphedema  -has significant bilateral lower extremity  -has greatly improved with lymphedema therapy -she recently received a device that has helped so much that she was able to get off of Lasix.  -has  ordered compression garments -does not eat processed foods or salt.  -she has ordered something from [employer] to give her lymphatic massage.  -improving but still very bothersome to her -discussed bloodwork today  4) HTN: -BP 138/79  5) Decreased appetite:  -resolved with increasing [name]  6) She developed pain in right side, and not sure if this is due to topamax or the antibiotics she is on to treat a respiratory infection.   7) Insomnia:  -sleeping poorly due to her CPAP mask, pressure is rising at night and this is causing mask leak.  -respiratory therapist will seeing her tomorrow.  -she asks about Inspire.  -she follows with [name] and she says he is also a sleep doctor -she hates to reduce the medicine.  -she has tried 5 different masks -the machine is very loud.  -she can sleep without the CPAP fine.   8) Chest congestion -she has been having symptoms of congestion for greater than 3 months -Her father saw 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 She received a call to schedule an appointment with [name] office and is thankful for the referral.  -she asks what else she can do to help with this -she has been through multiple rounds of antibiotics without benefit.  -also feeling diffuse joint aches.   9) GERD -has been experiencing after eating  10) Sinus congestion -pleased that her CT results were normal.   11) OSA -she notes that she was found to have OSA on recent sleep study and she was tearful about this as she feels it is just another condition she has -she was surprised as she feels that she sleeps well.  -she has note been sleeping well while using the CPAP mask and was told that O2 is leaking -she is working with a respiratory therapist -she feels anxiety about whether she will get enough sleep tonight, she has only been sleeping about 4 hours since using the CPAP -she has  decided she does not want to pursue Inspire yet until she has learned more about it.  -she had a very productive visit with [name] and he advised her to try her best to tolerate the CPAP mask, which she is doing.  -followed up with pulmonology regarding [name] and was told she would be a good candidate for this treatment, but she should prefer to continue trying CPAP mask before trying Inspire  12) Chronic fatigue -she was diagnosed with COVID 9 days ago and her breathing has improved but she has been feeling very fatigued -she has been taking the N-acetyl-cysteine supplement -she asks about taking injetable B12 as this really helped her son -she has not been wearing her CPAP as she is waiting for a new mask  13) Pain -takes Tylenol and baclofen  14) Lower extremity weakness: -improved -went to ED and there was no evidence of stroke. Thought to be due to her Risperdal -her legs  feel stiff -no other focal deficits -she started Risperdal on Wednesday night and asks if this could be causing her weakness -[name] has been trying to wean her off Mirtazapine and Seroquel due to weight gain and onto Risperdal for her anxiety  15) Bilateral lower extremity pain -feels stiff and tight like spasticity -she asks if she can take extra Baclofen  16) depression -she has not yet found Zoloft to be helpful and has given it a couple of weeks -she asks if dose can be increased.   17) frequent falls -she says she is being admitted to sNF due to recent frequent falls.  18) Nausea -needs refill of Zofran  Pain Inventory Average Pain 5 Pain Right Now 2 My pain is aching and no pain.  Sometimes spasm in legs at night because of MST  In the last 24 hours, has pain interfered with the following? General activity 4 Relation with others 7 Enjoyment of life 5  What TIME of day is your pain at its worst? night Sleep (in general) Fair  Pain is worse with: sitting and unsure Pain  improves with: medication Relief from Meds: 5  Family History  Problem Relation Age of Onset   Arthritis Mother    Hypertension Mother    Macular degeneration Mother    Hypertension Father    Hyperlipidemia Father    Heart disease Maternal Grandfather    Diabetes Maternal Grandfather    Kidney disease Paternal Grandmother    Social History   Socioeconomic History   Marital status: Married    Spouse name: Engineer, technical sales   Number of children: 3   Years of education: 14   Highest education level: Associate degree: academic program  Occupational History   Not on file  Tobacco Use   Smoking status: Never   Smokeless tobacco: Never  Vaping Use   Vaping Use: Never used  Substance and Sexual Activity   Alcohol use: No    Alcohol/week: 0.0 standard drinks of alcohol   Drug use: No   Sexual activity: Not Currently  Other Topics Concern   Not on file  Social History Narrative   Lives with husband    Right handed   Caffeine: 1 cup of coffee in the AM, coke occa. Trying to come off caffeine    Social Determinants of Health   Financial Resource Strain: Low Risk  (01/07/2022)   Overall Financial Resource Strain (CARDIA)    Difficulty of Paying Living Expenses: Not hard at all  Food Insecurity: No Food Insecurity (01/07/2022)   Hunger Vital Sign    Worried About Running Out of Food in the Last Year: Never true    Ran Out of Food in the Last Year: Never true  Transportation Needs: No Transportation Needs (01/07/2022)   PRAPARE - Hydrologist (Medical): No    Lack of Transportation (Non-Medical): No  Physical Activity: Insufficiently Active (01/07/2022)   Exercise Vital Sign    Days of Exercise per Week: 2 days    Minutes of Exercise per Session: 60 min  Stress: No Stress Concern Present (01/07/2022)   [place]    Feeling of Stress : Not at all  Social Connections: Unknown (01/07/2022)   Social  Connection and Isolation Panel [NHANES]    Frequency of Communication with Friends and Family: Not on file    Frequency of Social Gatherings with Friends and Family: Not on file    Attends  Religious Services: Not on file    Active Member of Clubs or Organizations: Not on file    Attends Club or Organization Meetings: Not on file    Marital Status: Married   Past Surgical History:  Procedure Laterality Date   BACK SURGERY     CHOLECYSTECTOMY     COLONOSCOPY WITH PROPOFOL N/A 05/18/2017   Procedure: COLONOSCOPY WITH PROPOFOL;  Surgeon: [name], MD;  Location: [facility] ENDOSCOPY;  Service: Endoscopy;  Laterality: N/A;   FOOT SURGERY  2015   GALLBLADDER SURGERY  2008   HARDWARE REMOVAL Left 02/14/2016   Procedure: LEFT FOOT REMOVAL DEEP IMPLANT;  Surgeon: [name], MD;  Location: [place];  Service: Orthopedics;  Laterality: Left;   HERNIA REPAIR     Inguinal Hernia Repair   SPINE SURGERY  2014   Past Surgical History:  Procedure Laterality Date   BACK SURGERY     CHOLECYSTECTOMY     COLONOSCOPY WITH PROPOFOL N/A 05/18/2017   Procedure: COLONOSCOPY WITH PROPOFOL;  Surgeon: [name], MD;  Location: [facility] ENDOSCOPY;  Service: Endoscopy;  Laterality: N/A;   FOOT SURGERY  2015   GALLBLADDER SURGERY  2008   HARDWARE REMOVAL Left 02/14/2016   Procedure: LEFT FOOT REMOVAL DEEP IMPLANT;  Surgeon: [name], MD;  Location: [place];  Service: Orthopedics;  Laterality: Left;   HERNIA REPAIR     Inguinal Hernia Repair   SPINE SURGERY  2014   Past Medical History:  Diagnosis Date   Allergy    Anxiety    Aspiration pneumonia (HCC)    Depression    Frequent headaches    H/O   GERD (gastroesophageal reflux disease)    History of chicken pox    History of colon polyps    Hx of migraines    Hypertension    Lymphedema    Multiple sclerosis ([place]) 2011   OSA (obstructive sleep apnea)    PONV (postoperative nausea and vomiting)    Scoliosis     There were no vitals taken for this visit.  Opioid Risk Score:   Fall Risk Score:  `1  Depression screen PHQ 2/9     04/09/2022   10:00 AM 01/07/2022    3:15 PM 11/05/2021   10:10 AM 08/27/2021   11:12 AM 08/15/2021   10:50 AM 07/19/2021    3:28 PM 06/18/2021    9:24 AM  Depression screen PHQ 2/9  Decreased Interest 1 0 0 2 0 1 3  Down, Depressed, Hopeless 1 0 1 2 0 1 3  PHQ - 2 Score 2 0 1 4 0 2 6  Altered sleeping 0        Tired, decreased energy 1        Change in appetite 0        Feeling bad or failure about yourself  0        Trouble concentrating 0        Moving slowly or fidgety/restless 0        Suicidal thoughts 0        PHQ-9 Score 3        Difficult doing work/chores Not difficult at all          Review of Systems  Constitutional: Negative.   HENT: Negative.    Eyes: Negative.   Respiratory:  Negative for cough.        Chest congestion  Cardiovascular: Negative.   Gastrointestinal: Negative.   Endocrine:  Negative.   Genitourinary: Negative.   Musculoskeletal:  Positive for back pain and gait problem.  Skin: Negative.   Allergic/Immunologic: Negative.   Hematological:  Bruises/bleeds easily.       Plavix  Psychiatric/Behavioral:  Positive for dysphoric mood.   All other systems reviewed and are negative.      Objective:   Physical Exam Patient seen via phone     Assessment & Plan:  1) Anxiety: -discussed with patient the side effects of both Seroquel and Lorazepam, as well as potential interactions with other medications. -recommended increasing Seroquel to '25mg'$  prn -discussed referral to psychiatry, placed referral to Dr. Sonny Dandy who was recommended to her by [name], discussed that it may be at least a week before she hears from them. -continue Zoloft, discussed that this will take 2-4 weeks to have effect, reinforced that she may not feel benefit from this yet.  -discussed benefits of exercise and good nutrition -discussed her move  because it is stressing her.  -discussed whether we should increase Seroquel due to her anxiety. Discussed that she is currently taking 12.'5mg'$  3 times per day PRN -discussed about her stress of seeing many different doctors -advised to always use the lowest dose of medications that she needs to minimize side effects -discussed the buspar that was started for her by Dr. Kerman Passey- she has not yet noted benefits from this, advised that she does not need to take it if she does not feel benefits.  -referred to psychiatry in [facility] for CBT -reinforced importance of thinking positively.  -continue to encourage non-pharmacologic management approaches as well, such as meditation, applying lavender oil, and exercise- all of which she has been trying and which have been helping.  -discussed alternative anxiety medications.  -continue seroquel 12.'5mg'$  daily and '25mg'$  at night. Can decreased to '25mg'$  at night and assess positive/negative effects during the day. Can restart 1/2 tab during the day if anxiety is uncontrolled.  -discontinue lorazepam.  -establish care with [name] for neuropsych counseling on 8/2. They really valued the appointment with him.  -discontinue topamax in case pain in right side is due to medication- discussed can also be due to antibiotics which she is taking for respiratory infection- discontinuing the topamax will help [place] know if it is the cause of her pain or not. -refilled [name] '30mg'$  HS.  -refilled Cymbalta '60mg'$  -Discussed exercise and meditation as tools to decrease anxiety. -Recommended Down Dog Yoga app -Discussed spending time outdoors. -Discussed positive re-framing of anxiety.  -Discussed the following foods that have been show to reduce anxiety: 1) [place] nuts, mushrooms, soy beans due to their high selenium content. Upper limit of toxicity of selenium is 452mg/day so no more than 3-4 bBolivianuts per day.  2) Fatty fish such as salmon, mackerel, sardines,  trout, and herring- high in omega-3 fatty acids 3) Eggs- increases serotonin and dopamine 4) Pumpkin seeds- high in omega-3 fatty acids 5) dark chocolate- high in flavanols that increase blood flow to brain 6) turmeric- take with black pepper to increase absorption 7) chamomile tea- antioxidant and anti-inflammatory properties 8) yogurt without sugar- supports gut-brain axis 9) green tea- contains L- theanine 10) blueberries- high in vitamin C and antioxidants 11) t[place] high in tryptophan which gets converted to serotonin 12) bell peppers- rich in vitamin C and antioxidants 13) citrus fruits- rich in vitamin C and antioxidants 14) almonds- high in vitamin E and healthy fats 15) chia seeds- high in omega-3 fatty acids  2) MS -continue  home therapy to minimize stiffness/maximize mobility -f/u with MS specialist. Discussed her appointmenr with Dr. Kerman Passey.  -provided with neurology referral -discussed trying lipoic acid  3) HTN:  -discussed good blood pressure today.  BP has been 130s-150s/80s, recommended citrus foods and nuts, as much mobility as she can tolerate, log Bps daily and bring log to f/u appointment.  -continue current regimen and checking and checking BP.  -advised citrus foods and nuts to help lower BP. Discussed that once BP is consistently 120/80 we can wean her Losartan to '25mg'$ .   4) Bilateral lower extremity edema: - continue lymphedema therapy which is greatly helping.  -Lasix discontinued. Discussed that this should also improve her kidney function -advised low salt diet.  -prescribed new compression garments, will fax to company.  -discussed doing the compression garments early in the morning at 9am  -discussed that her kidney function is normal on last check in March.   5) Insomnia: -recommended tart cherry juice with dinner, valerian root and chamomile teas in evening, applying lavender drops to forehead at night. -continue [name] '30mg'$  HS- discussed that  we will not try to wean off again since we saw how much this is benefittng her -discontinue amantadine.  -discussed considering the Inspire trial since he has tried the CPAP for a long time now and can not tolerate it.   6) Obesity BMI 33.13 -attempted to wean off [name] but she developed worsening insomnia, anxiety, and decreased appetite that was undesirable to her, so have restarted the medication.  -discussed trial of avoiding gluten for 3 months and how this could potentially help with her weight loss, anxiety, and prevention/slowing of her chronic diseases. -discussed benefits of intermittent fasting- made goal to set a consistent dinner time and avoid snacking after this time. If she is hungry, choose a healthy snack of nuts, fruit, or vegetables.  -provided a link to a pdf of Pete Escogue's musculoskeletal alignment exercises: [email or web address]   7) Chest congestion -referred for pulmonary consult with [name], who treated her father for pulmonary fibrosis. She has made an appointment to establish care with him.  -recommended drinking daily warm water with honey, lime and ginger -recommended drinking tea with holy Basil (Tulsi)  8) Sore throat: -continue salt water gargling  9) Sinus congestion: -recommended humidifier -drink 6-8 glasses of water per day.   10) fatigue -checked B12, folate, TSH, vitamin D, magnesium and discussed bloodwork with patient and husband: all in normal range except for suboptimal Vitamin D- high dose supplement prescribed -Recommended continuing NAC (N-Acetyl cysteine) '600mg'$  BID for her fatigue. Discussed its benefits in boosting glutathione and mitochondrial function. -prescribed monthly injectable B12 as patient would like to try this. Discussed that there are few risks to having elevated B12 levels -discussed that not wearing her CPAP is also likely contributing to her  fatigue -discussed modafinil but she prefers to try the B12 first   11) OSA -discussed that OSA can contribute to daytime fatigue, weight gain, anxiety, and chronic conditions so it is good to get this condition treated. -referred to Dr. Melida Quitter for eval for [facility] given that she is having a hard time sleeping with her CPAP. Discussed her decision not to pursue Inspire at this time and instead try to tolerate her current mask as best as possible. Discussed her positive conversation with [name]. -advised her to talk to her respiratory therapist about fitting another mask -discussed Inspire but she says unfortunately it is not covered by insurance.  -discussed  strap under the chin.  -discussed following up with pulmonology for Inspire.  -agreed with her decision to continue to try CPAP before pursuing Inspire due to her fears of the procedure  12) Low back pain: -recommended doTerra Deep Blue Essential oil and applied to area of pain today- discussed that this is made of natural plant oils. Shared by personal experience of benefit from use of this essential oil -provided a link to a pdf of Pete Escogue's musculoskeletal alignment exercises: [email or web address]   13) Lower extremity weakness: -discussed that this could be a possible side effect of the Risperdal.  -Recommended calling Dr. Garth Bigness office to let him know about the lower extremity weakness -discussed that she should go to the ED as unilateral lower extremity weakness could be a symptom of stroke or MS flare -discussed stopping Risperdal until she can talk with [name] -Discussed using Seroquel prn for anxiety in the meantime  14) Depression -prescribed Zoloft which she has had good response to in the past. Increase dose to '50mg'$ . 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

## 2022-04-23 ENCOUNTER — Ambulatory Visit: Payer: BC Managed Care – PPO | Admitting: Psychology

## 2022-04-23 ENCOUNTER — Ambulatory Visit
Admission: RE | Admit: 2022-04-23 | Discharge: 2022-04-23 | Disposition: A | Discharge status: Home / Self Care | Payer: Medicare Other | Attending: Internal Medicine | Admitting: Internal Medicine

## 2022-04-23 ENCOUNTER — Ambulatory Visit: Payer: Medicare Other | Admitting: Certified Registered Nurse Anesthetist

## 2022-04-23 ENCOUNTER — Encounter: Payer: Self-pay | Admitting: Internal Medicine

## 2022-04-23 ENCOUNTER — Telehealth: Payer: Self-pay

## 2022-04-23 ENCOUNTER — Encounter
Admission: RE | Disposition: A | Discharge status: Home / Self Care | Payer: Self-pay | Source: Home / Self Care | Attending: Internal Medicine

## 2022-04-23 DIAGNOSIS — G35 Multiple sclerosis: Secondary | ICD-10-CM | POA: Insufficient documentation

## 2022-04-23 DIAGNOSIS — Z79899 Other long term (current) drug therapy: Secondary | ICD-10-CM | POA: Insufficient documentation

## 2022-04-23 DIAGNOSIS — D175 Benign lipomatous neoplasm of intra-abdominal organs: Secondary | ICD-10-CM | POA: Diagnosis not present

## 2022-04-23 DIAGNOSIS — K635 Polyp of colon: Secondary | ICD-10-CM | POA: Diagnosis not present

## 2022-04-23 DIAGNOSIS — K5730 Diverticulosis of large intestine without perforation or abscess without bleeding: Secondary | ICD-10-CM | POA: Diagnosis not present

## 2022-04-23 DIAGNOSIS — K640 First degree hemorrhoids: Secondary | ICD-10-CM | POA: Diagnosis not present

## 2022-04-23 DIAGNOSIS — Z1211 Encounter for screening for malignant neoplasm of colon: Secondary | ICD-10-CM | POA: Diagnosis not present

## 2022-04-23 DIAGNOSIS — K219 Gastro-esophageal reflux disease without esophagitis: Secondary | ICD-10-CM | POA: Diagnosis not present

## 2022-04-23 DIAGNOSIS — Z86010 Personal history of colonic polyps: Secondary | ICD-10-CM | POA: Insufficient documentation

## 2022-04-23 DIAGNOSIS — I10 Essential (primary) hypertension: Secondary | ICD-10-CM | POA: Insufficient documentation

## 2022-04-23 DIAGNOSIS — F419 Anxiety disorder, unspecified: Secondary | ICD-10-CM | POA: Diagnosis not present

## 2022-04-23 DIAGNOSIS — F32A Depression, unspecified: Secondary | ICD-10-CM | POA: Diagnosis not present

## 2022-04-23 DIAGNOSIS — G4733 Obstructive sleep apnea (adult) (pediatric): Secondary | ICD-10-CM | POA: Diagnosis not present

## 2022-04-23 DIAGNOSIS — Z7902 Long term (current) use of antithrombotics/antiplatelets: Secondary | ICD-10-CM | POA: Diagnosis not present

## 2022-04-23 DIAGNOSIS — I69351 Hemiplegia and hemiparesis following cerebral infarction affecting right dominant side: Secondary | ICD-10-CM | POA: Diagnosis not present

## 2022-04-23 DIAGNOSIS — D122 Benign neoplasm of ascending colon: Secondary | ICD-10-CM | POA: Diagnosis not present

## 2022-04-23 HISTORY — PX: COLONOSCOPY: SHX5424

## 2022-04-23 SURGERY — COLONOSCOPY
Anesthesia: General

## 2022-04-23 MED ORDER — LIDOCAINE HCL (CARDIAC) PF 100 MG/5ML IV SOSY
PREFILLED_SYRINGE | INTRAVENOUS | Status: DC | PRN
  Administered 2022-04-23: 50 mg via INTRAVENOUS

## 2022-04-23 MED ORDER — ONDANSETRON HCL 4 MG/2ML IJ SOLN
INTRAMUSCULAR | Status: DC | PRN
  Administered 2022-04-23: 4 mg via INTRAVENOUS

## 2022-04-23 MED ORDER — SODIUM CHLORIDE 0.9 % IV SOLN
INTRAVENOUS | Status: DC
  Administered 2022-04-23: 20 mL/h via INTRAVENOUS

## 2022-04-23 MED ORDER — SUCCINYLCHOLINE CHLORIDE 200 MG/10ML IV SOSY
PREFILLED_SYRINGE | INTRAVENOUS | Status: DC | PRN
  Administered 2022-04-23: 100 mg via INTRAVENOUS

## 2022-04-23 MED ORDER — PROPOFOL 500 MG/50ML IV EMUL
INTRAVENOUS | Status: DC | PRN
  Administered 2022-04-23: 150 ug/kg/min via INTRAVENOUS

## 2022-04-23 MED ORDER — PROPOFOL 10 MG/ML IV BOLUS
INTRAVENOUS | Status: DC | PRN
  Administered 2022-04-23: 100 mg via INTRAVENOUS

## 2022-04-23 MED ORDER — DEXAMETHASONE SODIUM PHOSPHATE 10 MG/ML IJ SOLN
INTRAMUSCULAR | Status: DC | PRN
  Administered 2022-04-23: 10 mg via INTRAVENOUS

## 2022-04-23 NOTE — Op Note (Signed)
[facility] Gastroenterology Patient Name: Carly [name] Procedure Date: 04/23/2022 11:18 AM MRN: [id number] Account #: [id number] Date of Birth: 09/07/1951 Admit Type: Outpatient Age: 71 Room: [facility] ENDO ROOM 2 Gender: Female Note Status: Finalized Instrument Name: Jasper Riling [id number] Procedure:             Colonoscopy Indications:           High risk colon cancer surveillance: Personal history                         of non-advanced adenoma Providers:             Benay Pike. Alice Reichert MD, MD Referring MD:          Einar Pheasant, MD (Referring MD) Medicines:             Propofol per Anesthesia Complications:         No immediate complications. Procedure:             Pre-Anesthesia Assessment:                        - The risks and benefits of the procedure and the                         sedation options and risks were discussed with the                         patient. All questions were answered and informed                         consent was obtained.                        - Patient identification and proposed procedure were                         verified prior to the procedure by the nurse. The                         procedure was verified in the procedure room.                        - ASA Grade Assessment: III - A patient with severe                         systemic disease.                        - After reviewing the risks and benefits, the patient                         was deemed in satisfactory condition to undergo the                         procedure.                        After obtaining informed consent, the colonoscope was                         passed under direct  vision. Throughout the procedure,                         the patient's blood pressure, pulse, and oxygen                         saturations were monitored continuously. The                         Colonoscope was introduced through the anus and                          advanced to the the cecum, identified by appendiceal                         orifice and ileocecal valve. The colonoscopy was                         technically difficult and complex due to restricted                         mobility of the colon, a redundant colon and                         significant looping. Successful completion of the                         procedure was aided by using manual pressure. The                         patient tolerated the procedure well. The quality of                         the bowel preparation was adequate. The ileocecal                         valve, appendiceal orifice, and rectum were                         photographed. Findings:      The perianal and digital rectal examinations were normal. Pertinent       negatives include normal sphincter tone and no palpable rectal lesions.      Many small-mouthed diverticula were found in the left colon. There was       no evidence of diverticular bleeding.      Non-bleeding internal hemorrhoids were found during retroflexion. The       hemorrhoids were Grade I (internal hemorrhoids that do not prolapse).      A moderate amount of liquid stool was found in the rectum, in the       sigmoid colon, in the ascending colon and in the cecum, making       visualization difficult. Lavage of the area was performed using a       moderate amount of sterile water, resulting in clearance with adequate       visualization.      There was a small lipoma, 20 mm in diameter, in the ascending colon.       Biopsies were taken with a cold forceps for histology.  The exam was otherwise without abnormality. Impression:            - Mild diverticulosis in the left colon. There was no                         evidence of diverticular bleeding.                        - Non-bleeding internal hemorrhoids.                        - Stool in the rectum, in the sigmoid colon, in the                         ascending colon and in  the cecum.                        - Small lipoma in the ascending colon. Biopsied.                        - The examination was otherwise normal. Recommendation:        - Patient has a contact number available for                         emergencies. The signs and symptoms of potential                         delayed complications were discussed with the patient.                         Return to normal activities tomorrow. Written                         discharge instructions were provided to the patient.                        - Resume previous diet.                        - Continue present medications.                        - Resume Plavix (clopidogrel) at prior dose today.                         Refer to managing physician for further adjustment of                         therapy.                        - No repeat colonoscopy due to current age (48 years                         or older) and the absence of advanced adenomas.                        - You do NOT require further colon cancer screening  measures (Annual stool testing (i.e. hemoccult, FIT,                         cologuard), sigmoidoscopy, colonoscopy or CT                         colonography). You should share this recommendation                         with your Primary Care provider.                        - Return to GI office PRN.                        - The findings and recommendations were discussed with                         the patient.                        - The findings and recommendations were discussed with                         the designated responsible adult. Procedure Code(s):     --- Professional ---                        [id number], Colonoscopy, flexible; with biopsy, single or                         multiple Diagnosis Code(s):     --- Professional ---                        K57.30, Diverticulosis of large intestine without                         perforation or abscess  without bleeding                        D17.5, Benign lipomatous neoplasm of intra-abdominal                         organs                        K64.0, First degree hemorrhoids                        Z86.010, Personal history of colonic polyps CPT copyright 2019 [employer]. All rights reserved. The codes documented in this report are preliminary and upon coder review may  be revised to meet current compliance requirements. Efrain Sella MD, MD 04/23/2022 12:17:00 PM This report has been signed electronically. Number of Addenda: 0 Note Initiated On: 04/23/2022 11:18 AM Scope Withdrawal Time: 0 hours 5 minutes 24 seconds  Total Procedure Duration: 0 hours 30 minutes 39 seconds  Estimated Blood Loss:  Estimated blood loss: none. Estimated blood loss: none.      [facility]

## 2022-04-23 NOTE — Telephone Encounter (Signed)
Left message letting patient's husband, Shealyn Sean, know that patient's letter is ready and he may pick it up whenever he is ready.

## 2022-04-23 NOTE — Anesthesia Preprocedure Evaluation (Signed)
Anesthesia Evaluation  General Assessment Comment:History of aspiration x 2 after prior anesthetics.  History of Anesthesia Complications (+) PONV and history of anesthetic complications  Airway Mallampati: II  TM Distance: >3 FB Neck ROM: Full    Dental no notable dental hx. (+) Teeth Intact   Pulmonary sleep apnea and Continuous Positive Airway Pressure Ventilation ,    Pulmonary exam normal breath sounds clear to auscultation       Cardiovascular hypertension, Pt. on medications  Rhythm:Regular Rate:Normal - Systolic murmurs    Neuro/Psych PSYCHIATRIC DISORDERS Anxiety Depression Residual right arm weakness CVA, Residual Symptoms    GI/Hepatic GERD  Medicated,  Endo/Other    Renal/GU      Musculoskeletal   Abdominal   Peds  Hematology   Anesthesia Other Findings   Reproductive/Obstetrics                             Anesthesia Physical Anesthesia Plan  ASA: 3  Anesthesia Plan: General   Post-op Pain Management: Minimal or no pain anticipated   Induction: Intravenous and Rapid sequence  PONV Risk Score and Plan: 4 or greater and Ondansetron, Dexamethasone, TIVA and Propofol infusion  Airway Management Planned: Oral ETT and Video Laryngoscope Planned  Additional Equipment: None  Intra-op Plan:   Post-operative Plan: Extubation in OR  Informed Consent: I have reviewed the patients History and Physical, chart, labs and discussed the procedure including the risks, benefits and alternatives for the proposed anesthesia with the patient or authorized representative who has indicated his/her understanding and acceptance.     Dental advisory given  Plan Discussed with: CRNA and Surgeon  Anesthesia Plan Comments: (Discussed risks of anesthesia with patient, including PONV, sore throat, lip/dental/eye damage, aspiration. Rare risks discussed as well, such as cardiorespiratory and  neurological sequelae, and allergic reactions. Discussed the role of CRNA in patient's perioperative care. Patient understands. Patient very nervous about aspiration risk. I assured her that we are taking the appropriate measures to reduce this risk with an RSI. She also requested I speak to her husband [name], which I did. )        Anesthesia Quick Evaluation

## 2022-04-23 NOTE — Transfer of Care (Signed)
Immediate Anesthesia Transfer of Care Note  Patient: Carly [name]  Procedure(s) Performed: COLONOSCOPY  Patient Location: PACU  Anesthesia Type:General  Level of Consciousness: drowsy  Airway & Oxygen Therapy: Patient Spontanous Breathing  Post-op Assessment: Report given to RN and Post -op Vital signs reviewed and stable  Post vital signs: Reviewed and stable  Last Vitals:  Vitals Value Taken Time  BP    Temp    Pulse    Resp    SpO2      Last Pain:  Vitals:   04/23/22 1041  TempSrc: Temporal  PainSc: 0-No pain         Complications: No notable events documented.

## 2022-04-23 NOTE — Anesthesia Postprocedure Evaluation (Signed)
Anesthesia Post Note  Patient: Carly [name]  Procedure(s) Performed: COLONOSCOPY  Patient location during evaluation: Endoscopy Anesthesia Type: General Level of consciousness: awake and alert Pain management: pain level controlled Vital Signs Assessment: post-procedure vital signs reviewed and stable Respiratory status: spontaneous breathing, nonlabored ventilation, respiratory function stable and patient connected to nasal cannula oxygen Cardiovascular status: blood pressure returned to baseline and stable Postop Assessment: no apparent nausea or vomiting Anesthetic complications: no   No notable events documented.   Last Vitals:  Vitals:   04/23/22 1228 04/23/22 1238  BP: 125/70 (!) 126/92  Pulse: 82 79  Resp: 20 20  Temp:    SpO2: 99% 98%    Last Pain:  Vitals:   04/23/22 1238  TempSrc:   PainSc: 0-No pain                 Arita Miss

## 2022-04-23 NOTE — Anesthesia Procedure Notes (Signed)
Procedure Name: Intubation Date/Time: 04/23/2022 11:34 AM  Performed by: Tollie Eth, CRNAPre-anesthesia Checklist: Patient identified, Patient being monitored, Timeout performed, Emergency Drugs available and Suction available Patient Re-evaluated:Patient Re-evaluated prior to induction Oxygen Delivery Method: Circle system utilized Preoxygenation: Pre-oxygenation with 100% oxygen Induction Type: IV induction and Rapid sequence Laryngoscope Size: Mac and 3 Grade View: Grade I Tube type: Oral Tube size: 6.5 mm Number of attempts: 1 Airway Equipment and Method: Stylet and Video-laryngoscopy Placement Confirmation: ETT inserted through vocal cords under direct vision, positive ETCO2 and breath sounds checked- equal and bilateral Secured at: 21 cm Tube secured with: Tape Dental Injury: Teeth and Oropharynx as per pre-operative assessment

## 2022-04-23 NOTE — H&P (Signed)
Outpatient short stay form Pre-procedure 04/23/2022 2:02 PM Carly [name] K. Alice Reichert, M.D.  Primary Physician: Einar Pheasant, M.D.  Reason for visit:  Personal history of adenomatous colon polyps  History of present illness:                            Patient presents for colonoscopy for a personal hx of colon polyps. The patient denies abdominal pain, abnormal weight loss or rectal bleeding.      Current Facility-Administered Medications:    0.9 %  sodium chloride infusion, , Intravenous, Continuous, Shalese Strahan, Benay Pike, MD, Last Rate: 20 mL/hr at 04/23/22 1056, 20 mL/hr at 04/23/22 1056  Facility-Administered Medications Ordered in Other Encounters:    0.9 %  sodium chloride infusion, , Intravenous, Continuous, Corcoran, Melissa C, MD, Last Rate: 10 mL/hr at 04/23/22 1127, Continued from Pre-op at 04/23/22 1127   acetaminophen (TYLENOL) tablet 650 mg, 650 mg, Oral, Once, Corcoran, Drue Second, MD  Medications Prior to Admission  Medication Sig Dispense Refill Last Dose   acetaminophen (TYLENOL) 500 MG tablet Take 1 tablet (500 mg total) by mouth every 4 (four) hours as needed. 30 tablet 0 04/22/2022   albuterol (VENTOLIN HFA) 108 (90 Base) MCG/ACT inhaler Inhale 2 puffs into the lungs every 6 (six) hours as needed. 18 g 0 04/22/2022   amLODipine (NORVASC) 5 MG tablet TAKE 1 TABLET BY MOUTH DAILY. 90 tablet 1 04/22/2022   azelastine (ASTELIN) 0.1 % nasal spray SMARTSIG:1-2 Spray(s) Both Nares Twice Daily   04/22/2022   Baclofen 5 MG TABS Take 1 tablet by mouth in the morning, at noon, in the evening, and at bedtime. 120 tablet 5 04/23/2022 at 0700   busPIRone (BUSPAR) 15 MG tablet Take 1 tablet (15 mg total) by mouth 2 (two) times daily. 60 tablet 5 04/22/2022   Cholecalciferol (D3-1000 PO) Take 2,000 Units by mouth daily.   04/22/2022   clopidogrel (PLAVIX) 75 MG tablet TAKE 1 TABLET (75 MG) BY MOUTH EVERY DAY 90 tablet 1 Past Week   Coenzyme Q10 (CO Q10) 100 MG CAPS Take 1 capsule by mouth daily.    04/22/2022   cyanocobalamin (,VITAMIN B-12,) 1000 MCG/ML injection Inject 1 mL (1,000 mcg total) into the muscle every 30 (thirty) days. 1 mL 3 04/22/2022   dalfampridine 10 MG TB12 Take 1 tablet (10 mg total) by mouth every 12 (twelve) hours. 60 tablet 11 04/22/2022   docusate sodium (COLACE) 100 MG capsule Take 100 mg by mouth 2 (two) times daily.   04/22/2022   DULoxetine (CYMBALTA) 60 MG capsule Take 1 capsule (60 mg total) by mouth daily. 90 capsule 3 04/22/2022   fluticasone (FLONASE) 50 MCG/ACT nasal spray Place into the nose.   04/22/2022   gabapentin (NEURONTIN) 100 MG capsule Take 1 capsule (100 mg total) by mouth 2 (two) times daily. (Patient taking differently: Take 100 mg by mouth daily.) 60 capsule 11 04/22/2022   losartan (COZAAR) 50 MG tablet TAKE 1 TABLET BY MOUTH DAILY 90 tablet 1 04/22/2022   magnesium oxide (MAG-OX) 400 MG tablet TAKE 1 TABLET BY MOUTH DAILY 30 tablet 5 04/22/2022   METAMUCIL FIBER PO Take by mouth 2 (two) times daily.   04/22/2022   nystatin cream (MYCOSTATIN) Apply 1 application topically 2 (two) times daily. 30 g 11 04/22/2022   omeprazole (PRILOSEC) 20 MG capsule Take 20 mg by mouth 2 (two) times daily before a meal.   04/22/2022   ondansetron (ZOFRAN) 4  MG tablet Take 1 tablet (4 mg total) by mouth every 8 (eight) hours as needed for nausea or vomiting. 20 tablet 0 04/22/2022   QUEtiapine (SEROQUEL) 25 MG tablet Take 1 tablet (25 mg total) by mouth at bedtime. 90 tablet 3 04/22/2022   rosuvastatin (CRESTOR) 5 MG tablet TAKE 1 TABLET BY MOUTH EVERY MONDAY, WEDNESDAY AND FRIDAY. 90 tablet 1 04/22/2022   saccharomyces boulardii (FLORASTOR) 250 MG capsule Take 1 capsule (250 mg total) by mouth 2 (two) times daily. 60 capsule 0 04/22/2022   sertraline (ZOLOFT) 50 MG tablet Take 1 tablet (50 mg total) by mouth daily. 90 tablet 3 04/22/2022   sodium chloride (OCEAN) 0.65 % nasal spray Place 1-2 sprays into the nose as needed.   04/22/2022   temazepam (RESTORIL) 15 MG capsule Take 1  capsule (15 mg total) by mouth at bedtime as needed for sleep. 30 capsule 5 04/22/2022   tiZANidine (ZANAFLEX) 4 MG tablet Take 1 tablet (4 mg total) by mouth every 6 (six) hours as needed for muscle spasms. 30 tablet 11 04/22/2022     Allergies  Allergen Reactions   Ibuprofen Swelling and Other (See Comments)   Sulfamethoxazole-Trimethoprim Itching   Penicillin G Rash   Asa [Aspirin] Swelling   Buspirone Other (See Comments)    Headaches     Levofloxacin Other (See Comments)    "weakness"   Lexapro [Escitalopram Oxalate] Other (See Comments)    Weakness    Pollen Extract Hives   Ceftin [Cefuroxime Axetil] Rash   Penicillins Rash   Sulfa Antibiotics Rash     Past Medical History:  Diagnosis Date   Allergy    Anxiety    Aspiration pneumonia (HCC)    Depression    Frequent headaches    H/O   GERD (gastroesophageal reflux disease)    History of chicken pox    History of colon polyps    Hx of migraines    Hypertension    Lymphedema    Multiple sclerosis ([place]) 2011   OSA (obstructive sleep apnea)    PONV (postoperative nausea and vomiting)    Scoliosis     Review of systems:  Otherwise negative.    Physical Exam  Gen: Alert, oriented. Appears stated age.  HEENT: [place]/AT. PERRLA. Lungs: CTA, no wheezes. CV: RR nl S1, S2. Abd: soft, benign, no masses. BS+ Ext: No edema. Pulses 2+    Planned procedures: Proceed with colonoscopy. The patient understands the nature of the planned procedure, indications, risks, alternatives and potential complications including but not limited to bleeding, infection, perforation, damage to internal organs and possible oversedation/side effects from anesthesia. The patient agrees and gives consent to proceed.  Please refer to procedure notes for findings, recommendations and patient disposition/instructions.     Carly [name] K. Alice Reichert, M.D. Gastroenterology 04/23/2022  2:02 PM

## 2022-04-23 NOTE — Interval H&P Note (Signed)
History and Physical Interval Note:  04/23/2022 2:03 PM  Carly [name]  has presented today for surgery, with the diagnosis of Hx of adenomatous colonic polyps (Z86.010).  The various methods of treatment have been discussed with the patient and family. After consideration of risks, benefits and other options for treatment, the patient has consented to  Procedure(s): COLONOSCOPY (N/A) as a surgical intervention.  The patient's history has been reviewed, patient examined, no change in status, stable for surgery.  I have reviewed the patient's chart and labs.  Questions were answered to the patient's satisfaction.     [place], [place]

## 2022-04-24 LAB — SURGICAL PATHOLOGY

## 2022-04-25 ENCOUNTER — Encounter (HOSPITAL_BASED_OUTPATIENT_CLINIC_OR_DEPARTMENT_OTHER): Payer: BC Managed Care – PPO | Admitting: Physical Medicine and Rehabilitation

## 2022-04-25 DIAGNOSIS — F411 Generalized anxiety disorder: Secondary | ICD-10-CM | POA: Diagnosis not present

## 2022-04-25 DIAGNOSIS — F418 Other specified anxiety disorders: Secondary | ICD-10-CM

## 2022-04-25 MED ORDER — QUETIAPINE FUMARATE 25 MG PO TABS: 25.0000 mg | ORAL_TABLET | Freq: Two times a day (BID) | ORAL | 3 refills | Status: DC

## 2022-04-26 NOTE — Progress Notes (Signed)
Subjective:    Patient ID: Carly [name], female    DOB: [birth year], 71 y.o.   MRN: [id number]  HPI  An audio/video tele-health visit is felt to be the most appropriate encounter for this patient at this time. This is a follow up tele-visit via phone. The patient is at home. MD is at office. Prior to scheduling this appointment, our staff discussed the limitations of evaluation and management by telemedicine and the availability of in-person appointments. The patient expressed understanding and agreed to proceed.    Carly [name] is a 71 year old woman who presents for f/u of white matter periventricular CVA, presents for follow-up regarding lymphedema, anxiety,chronic fatigue, insomnia, OSA, and weight gain, and new lower extremity weakness today.    1) Anxiety:  -her psychiatrist previously transitioned her from Seroquel back to benzodiazepine due to side effect profile of Seroquel, but this worsened her anxiety so we restarted it. She asks if she can increase Seroquel to '25mg'$  -she needs a new order for Seroquel as her current one only accounts for nighttime dose but she is also taking one prn during the day.  -she is currently a little out of it from the anesthesia for her recent colonoscopy -anxiety has been very severe -she started Zoloft this week and has not yet noted any benefit, she is a little discouraged as felt this should take effect sooner.  -she would like referral to Dr. Sonny Dandy who was recommended to her by [name], she has not yet heard from their office -she really likes following with [name] as well -she feels that her inability to see her grandkids is a trigger for her anxiety. She used to see them mmore in the past and does not know why she doesn;t get to any mores -she does not want to restart her [name] as she does not want the weight gain associated with this medication -she has only been sleeping 4 hours per night and she feels this worsens  her anxiety.  -she has been having a lot of anxiety related to her and her husband's move from their current home into a retirement home and she asks whether she may take the seroquel additionally as needed- once in the morning, once later in the day, and two at night -she is interested in following with a behavioral therapist -she and husband Carly [name] were scared of side effects of Seroquel that were discussed- she has not noted any this far.  -she has been sleeping but her legs feel very stiff at night -she continues to take Cymbalta '60mg'$  -we tried weaning her off the mirtazepine but she experienced weakness and insomnia and wanted to restart- titrated back up to her former dose.  -she discussed with her husband, who is also present on this call today, and she would prefer to restart Seroquel and stop Lorazepam.  -she has not had any counseling and would like to restart this. She has an appointment with [name] in August and would like to start counseling earlier than this if possible.  -she loved speaking with [name] inpatient and would like to follow with him -she had symptoms of dizziness yesterday at the hairdresser and was asked to contact me regarding if this could be from the seroquel. She had had no other similar episodes since starting it.  -she has been sleeping well with the Seroquel at night.  -she would like a list of foods that can help anxiety.  -she asks about the  dietary advice I gave her last visit regarding anxiety. -she has been using essential oil.  -anxiety has been bad -off prednisone now -exercise helps her.  -she gets upset over things easily -she wants to be able to calm down.  -she needs refills of her cymbalta and [name]  2) MS: -has been having a lot of stiffness recently, we had discssed increasing the baclofen and this does appear to have helped -EMS called twice due to difficulty getting her up off the floor -is receiving home therapy -she  would like to see a different neurologist and asked for my recommendation   3) Lymphedema  -has significant bilateral lower extremity  -has greatly improved with lymphedema therapy -she recently received a device that has helped so much that she was able to get off of Lasix.  -has ordered compression garments -does not eat processed foods or salt.  -she has ordered something from [employer] to give her lymphatic massage.  -improving but still very bothersome to her -discussed bloodwork today  4) HTN: -BP 138/79  5) Decreased appetite:  -resolved with increasing [name]  6) She developed pain in right side, and not sure if this is due to topamax or the antibiotics she is on to treat a respiratory infection.   7) Insomnia:  -sleeping poorly due to her CPAP mask, pressure is rising at night and this is causing mask leak.  -respiratory therapist will seeing her tomorrow.  -she asks about Inspire.  -she follows with [name] and she says he is also a sleep doctor -she hates to reduce the medicine.  -she has tried 5 different masks -the machine is very loud.  -she can sleep without the CPAP fine.   8) Chest congestion -she has been having symptoms of congestion for greater than 3 months -Her father saw 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 She received a call to schedule an appointment with [name] office and is thankful for the referral.  -she asks what else she can do to help with this -she has been through multiple rounds of antibiotics without benefit.  -also feeling diffuse joint aches.   9) GERD -has been experiencing after eating  10) Sinus congestion -pleased that her CT results were normal.   11) OSA -she notes that she was found to have OSA on recent sleep study and she was tearful about this as she feels it is just another condition she has -she was surprised as she feels that she sleeps  well.  -she has note been sleeping well while using the CPAP mask and was told that O2 is leaking -she is working with a respiratory therapist -she feels anxiety about whether she will get enough sleep tonight, she has only been sleeping about 4 hours since using the CPAP -she has decided she does not want to pursue Inspire yet until she has learned more about it.  -she had a very productive visit with [name] and he advised her to try her best to tolerate the CPAP mask, which she is doing.  -followed up with pulmonology regarding [name] and was told she would be a good candidate for this treatment, but she should prefer to continue trying CPAP mask before trying Inspire  12) Chronic fatigue -she was diagnosed with COVID 9 days ago and her breathing has improved but she has been feeling very fatigued -she has been taking the N-acetyl-cysteine supplement -she asks about taking  injetable B12 as this really helped her son -she has not been wearing her CPAP as she is waiting for a new mask  13) Pain -takes Tylenol and baclofen  14) Lower extremity weakness: -improved -went to ED and there was no evidence of stroke. Thought to be due to her Risperdal -her legs feel stiff -no other focal deficits -she started Risperdal on Wednesday night and asks if this could be causing her weakness -[name] has been trying to wean her off Mirtazapine and Seroquel due to weight gain and onto Risperdal for her anxiety  15) Bilateral lower extremity pain -feels stiff and tight like spasticity -she asks if she can take extra Baclofen  16) depression -she has not yet found Zoloft to be helpful and has given it a couple of weeks -she asks if dose can be increased.   17) frequent falls -she says she is being admitted to sNF due to recent frequent falls.  18) Nausea -needs refill of Zofran  Pain Inventory Average Pain 5 Pain Right Now 2 My pain is aching and no pain.  Sometimes spasm in legs  at night because of MST  In the last 24 hours, has pain interfered with the following? General activity 4 Relation with others 7 Enjoyment of life 5  What TIME of day is your pain at its worst? night Sleep (in general) Fair  Pain is worse with: sitting and unsure Pain improves with: medication Relief from Meds: 5  Family History  Problem Relation Age of Onset   Arthritis Mother    Hypertension Mother    Macular degeneration Mother    Hypertension Father    Hyperlipidemia Father    Heart disease Maternal Grandfather    Diabetes Maternal Grandfather    Kidney disease Paternal Grandmother    Social History   Socioeconomic History   Marital status: Married    Spouse name: Engineer, technical sales   Number of children: 3   Years of education: 14   Highest education level: Associate degree: academic program  Occupational History   Not on file  Tobacco Use   Smoking status: Never   Smokeless tobacco: Never  Vaping Use   Vaping Use: Never used  Substance and Sexual Activity   Alcohol use: No    Alcohol/week: 0.0 standard drinks of alcohol   Drug use: No   Sexual activity: Not Currently  Other Topics Concern   Not on file  Social History Narrative   Lives with husband    Right handed   Caffeine: 1 cup of coffee in the AM, coke occa. Trying to come off caffeine    Social Determinants of Health   Financial Resource Strain: Low Risk  (01/07/2022)   Overall Financial Resource Strain (CARDIA)    Difficulty of Paying Living Expenses: Not hard at all  Food Insecurity: No Food Insecurity (01/07/2022)   Hunger Vital Sign    Worried About Running Out of Food in the Last Year: Never true    Ran Out of Food in the Last Year: Never true  Transportation Needs: No Transportation Needs (01/07/2022)   PRAPARE - Hydrologist (Medical): No    Lack of Transportation (Non-Medical): No  Physical Activity: Insufficiently Active (01/07/2022)   Exercise Vital Sign    Days of  Exercise per Week: 2 days    Minutes of Exercise per Session: 60 min  Stress: No Stress Concern Present (01/07/2022)   [place]ctor  Questionnaire    Feeling of Stress : Not at all  Social Connections: Unknown (01/07/2022)   Social Connection and Isolation Panel [NHANES]    Frequency of Communication with Friends and Family: Not on file    Frequency of Social Gatherings with Friends and Family: Not on file    Attends Religious Services: Not on file    Active Member of Clubs or Organizations: Not on file    Attends Archivist Meetings: Not on file    Marital Status: Married   Past Surgical History:  Procedure Laterality Date   BACK SURGERY     CHOLECYSTECTOMY     COLONOSCOPY N/A 04/23/2022   Procedure: COLONOSCOPY;  Surgeon: Toledo, Benay Pike, MD;  Location: [facility] ENDOSCOPY;  Service: Gastroenterology;  Laterality: N/A;   COLONOSCOPY WITH PROPOFOL N/A 05/18/2017   Procedure: COLONOSCOPY WITH PROPOFOL;  Surgeon: [name], MD;  Location: [facility] ENDOSCOPY;  Service: Endoscopy;  Laterality: N/A;   FOOT SURGERY  2015   GALLBLADDER SURGERY  2008   HARDWARE REMOVAL Left 02/14/2016   Procedure: LEFT FOOT REMOVAL DEEP IMPLANT;  Surgeon: [name], MD;  Location: [facility];  Service: Orthopedics;  Laterality: Left;   HERNIA REPAIR     Inguinal Hernia Repair   SPINE SURGERY  2014   Past Surgical History:  Procedure Laterality Date   BACK SURGERY     CHOLECYSTECTOMY     COLONOSCOPY N/A 04/23/2022   Procedure: COLONOSCOPY;  Surgeon: Toledo, Benay Pike, MD;  Location: [facility] ENDOSCOPY;  Service: Gastroenterology;  Laterality: N/A;   COLONOSCOPY WITH PROPOFOL N/A 05/18/2017   Procedure: COLONOSCOPY WITH PROPOFOL;  Surgeon: [name], MD;  Location: [facility] ENDOSCOPY;  Service: Endoscopy;  Laterality: N/A;   FOOT SURGERY  2015   GALLBLADDER SURGERY  2008   HARDWARE REMOVAL Left 02/14/2016   Procedure: LEFT FOOT REMOVAL  DEEP IMPLANT;  Surgeon: [name], MD;  Location: [facility];  Service: Orthopedics;  Laterality: Left;   HERNIA REPAIR     Inguinal Hernia Repair   SPINE SURGERY  2014   Past Medical History:  Diagnosis Date   Allergy    Anxiety    Aspiration pneumonia (HCC)    Depression    Frequent headaches    H/O   GERD (gastroesophageal reflux disease)    History of chicken pox    History of colon polyps    Hx of migraines    Hypertension    Lymphedema    Multiple sclerosis ([place]) 2011   OSA (obstructive sleep apnea)    PONV (postoperative nausea and vomiting)    Scoliosis    There were no vitals taken for this visit.  Opioid Risk Score:   Fall Risk Score:  `1  Depression screen PHQ 2/9     04/09/2022   10:00 AM 01/07/2022    3:15 PM 11/05/2021   10:10 AM 08/27/2021   11:12 AM 08/15/2021   10:50 AM 07/19/2021    3:28 PM 06/18/2021    9:24 AM  Depression screen PHQ 2/9  Decreased Interest 1 0 0 2 0 1 3  Down, Depressed, Hopeless 1 0 1 2 0 1 3  PHQ - 2 Score 2 0 1 4 0 2 6  Altered sleeping 0        Tired, decreased energy 1        Change in appetite 0        Feeling bad or failure about yourself  0  Trouble concentrating 0        Moving slowly or fidgety/restless 0        Suicidal thoughts 0        PHQ-9 Score 3        Difficult doing work/chores Not difficult at all          Review of Systems  Constitutional: Negative.   HENT: Negative.    Eyes: Negative.   Respiratory:  Negative for cough.        Chest congestion  Cardiovascular: Negative.   Gastrointestinal: Negative.   Endocrine: Negative.   Genitourinary: Negative.   Musculoskeletal:  Positive for back pain and gait problem.  Skin: Negative.   Allergic/Immunologic: Negative.   Hematological:  Bruises/bleeds easily.       Plavix  Psychiatric/Behavioral:  Positive for dysphoric mood.   All other systems reviewed and are negative.      Objective:   Physical Exam Patient seen via  phone     Assessment & Plan:  1) Anxiety: -discussed with patient the side effects of both Seroquel and Lorazepam, as well as potential interactions with other medications. -recommended increasing Seroquel to '25mg'$  HS and adding an additional '25mg'$  during the day that can be taken once daily or split in half to be taken as 12.'5mg'$  BID PRN.  -discussed referral to psychiatry, placed referral to Dr. Sonny Dandy who was recommended to her by [name], discussed that it may be at least a week before she hears from them. -continue Zoloft, discussed that this will take 2-4 weeks to have effect, reinforced that she may not feel benefit from this yet. Discussed that not everyone response to every medicine and there are alternative options we can try if this is not effective enough.  -discussed benefits of exercise and good nutrition -discussed her move because it is stressing her.  -discussed whether we should increase Seroquel due to her anxiety. Discussed that she is currently taking 12.'5mg'$  3 times per day PRN -discussed about her stress of seeing many different doctors -advised to always use the lowest dose of medications that she needs to minimize side effects -discussed the buspar that was started for her by Dr. Kerman Passey- she has not yet noted benefits from this, advised that she does not need to take it if she does not feel benefits.  -referred to psychiatry in [facility] for CBT -reinforced importance of thinking positively.  -continue to encourage non-pharmacologic management approaches as well, such as meditation, applying lavender oil, and exercise- all of which she has been trying and which have been helping.  -discussed alternative anxiety medications.  -continue seroquel 12.'5mg'$  daily and '25mg'$  at night. Can decreased to '25mg'$  at night and assess positive/negative effects during the day. Can restart 1/2 tab during the day if anxiety is uncontrolled.  -discontinue lorazepam.  -establish care with  [name] for neuropsych counseling on 8/2. They really valued the appointment with him.  -discontinue topamax in case pain in right side is due to medication- discussed can also be due to antibiotics which she is taking for respiratory infection- discontinuing the topamax will help [place] know if it is the cause of her pain or not. -refilled [name] '30mg'$  HS.  -refilled Cymbalta '60mg'$  -Discussed exercise and meditation as tools to decrease anxiety. -Recommended Down Dog Yoga app -Discussed spending time outdoors. -Discussed positive re-framing of anxiety.  -Discussed the following foods that have been show to reduce anxiety: 1) [place] nuts, mushrooms, soy beans due to their high selenium  content. Upper limit of toxicity of selenium is 417mg/day so no more than 3-4 bBolivianuts per day.  2) Fatty fish such as salmon, mackerel, sardines, trout, and herring- high in omega-3 fatty acids 3) Eggs- increases serotonin and dopamine 4) Pumpkin seeds- high in omega-3 fatty acids 5) dark chocolate- high in flavanols that increase blood flow to brain 6) turmeric- take with black pepper to increase absorption 7) chamomile tea- antioxidant and anti-inflammatory properties 8) yogurt without sugar- supports gut-brain axis 9) green tea- contains L- theanine 10) blueberries- high in vitamin C and antioxidants 11) t[place] high in tryptophan which gets converted to serotonin 12) bell peppers- rich in vitamin C and antioxidants 13) citrus fruits- rich in vitamin C and antioxidants 14) almonds- high in vitamin E and healthy fats 15) chia seeds- high in omega-3 fatty acids  2) MS -continue home therapy to minimize stiffness/maximize mobility -f/u with MS specialist. Discussed her appointmenr with Dr. SKerman Passey  -provided with neurology referral -discussed trying lipoic acid  3) HTN:  -discussed good blood pressure today.  BP has been 130s-150s/80s, recommended citrus foods and nuts, as much mobility as  she can tolerate, log Bps daily and bring log to f/u appointment.  -continue current regimen and checking and checking BP.  -advised citrus foods and nuts to help lower BP. Discussed that once BP is consistently 120/80 we can wean her Losartan to '25mg'$ .   4) Bilateral lower extremity edema: - continue lymphedema therapy which is greatly helping.  -Lasix discontinued. Discussed that this should also improve her kidney function -advised low salt diet.  -prescribed new compression garments, will fax to company.  -discussed doing the compression garments early in the morning at 9am  -discussed that her kidney function is normal on last check in March.   5) Insomnia: -recommended tart cherry juice with dinner, valerian root and chamomile teas in evening, applying lavender drops to forehead at night. -continue [name] '30mg'$  HS- discussed that we will not try to wean off again since we saw how much this is benefittng her -discontinue amantadine.  -discussed considering the Inspire trial since he has tried the CPAP for a long time now and can not tolerate it.   6) Obesity BMI 33.13 -attempted to wean off [name] but she developed worsening insomnia, anxiety, and decreased appetite that was undesirable to her, so have restarted the medication.  -discussed trial of avoiding gluten for 3 months and how this could potentially help with her weight loss, anxiety, and prevention/slowing of her chronic diseases. -discussed benefits of intermittent fasting- made goal to set a consistent dinner time and avoid snacking after this time. If she is hungry, choose a healthy snack of nuts, fruit, or vegetables.  -provided a link to a pdf of Pete Escogue's musculoskeletal alignment exercises: [email or web address]   7) Chest congestion -referred for pulmonary consult with Dr. HRenee Harder who treated her father for pulmonary fibrosis. She has made an  appointment to establish care with him.  -recommended drinking daily warm water with honey, lime and ginger -recommended drinking tea with holy Basil (Tulsi)  8) Sore throat: -continue salt water gargling  9) Sinus congestion: -recommended humidifier -drink 6-8 glasses of water per day.   10) fatigue -checked B12, folate, TSH, vitamin D, magnesium and discussed bloodwork with patient and husband: all in normal range except for suboptimal Vitamin D- high dose supplement prescribed -Recommended continuing NAC (N-Acetyl cysteine) '600mg'$  BID for her fatigue. Discussed its benefits in boosting glutathione and mitochondrial function. -prescribed  monthly injectable B12 as patient would like to try this. Discussed that there are few risks to having elevated B12 levels -discussed that not wearing her CPAP is also likely contributing to her fatigue -discussed modafinil but she prefers to try the B12 first   11) OSA -discussed that OSA can contribute to daytime fatigue, weight gain, anxiety, and chronic conditions so it is good to get this condition treated. -referred to Dr. Melida Quitter for eval for [facility] given that she is having a hard time sleeping with her CPAP. Discussed her decision not to pursue Inspire at this time and instead try to tolerate her current mask as best as possible. Discussed her positive conversation with [name]. -advised her to talk to her respiratory therapist about fitting another mask -discussed Inspire but she says unfortunately it is not covered by insurance.  -discussed strap under the chin.  -discussed following up with pulmonology for Inspire.  -agreed with her decision to continue to try CPAP before pursuing Inspire due to her fears of the procedure  12) Low back pain: -recommended doTerra Deep Blue Essential oil and applied to area of pain today- discussed that this is made of natural plant oils. Shared by personal experience of benefit from use of this  essential oil -provided a link to a pdf of Pete Escogue's musculoskeletal alignment exercises: [email or web address]   13) Lower extremity weakness: -discussed that this could be a possible side effect of the Risperdal.  -Recommended calling Dr. Garth Bigness office to let him know about the lower extremity weakness -discussed that she should go to the ED as unilateral lower extremity weakness could be a symptom of stroke or MS flare -discussed stopping Risperdal until she can talk with [name] -Discussed using Seroquel prn for anxiety in the meantime  14) Depression -prescribed Zoloft which she has had good response to in the past. Increase dose to '50mg'$ . 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 -recommended heat, range of motion, use of baclofen -will plan for 100U Botox next visit -recommended increasing Baclofen to '15mg'$  TID PRN  16) Frequent falls -discussed that her facility is planning to send her to her facility's SNF -discussed that she will need to have NFL2 form filled by her PCP -discussed that the rehab and exercise she gets there will also help with her depression  17) Nausea -refilled zofran  5 minutes spent in discussion of her anxiety and depression, Seroquel dosage and ordering '25mg'$  HS and '25mg'$  to be taken at once of split into 12.'5mg'$  doses during the day twice.

## 2022-04-28 DIAGNOSIS — F331 Major depressive disorder, recurrent, moderate: Secondary | ICD-10-CM | POA: Diagnosis not present

## 2022-04-28 DIAGNOSIS — F411 Generalized anxiety disorder: Secondary | ICD-10-CM | POA: Diagnosis not present

## 2022-04-29 ENCOUNTER — Ambulatory Visit: Payer: Medicare Other | Admitting: Neurology

## 2022-04-29 ENCOUNTER — Telehealth: Payer: Self-pay | Admitting: Neurology

## 2022-04-29 NOTE — Telephone Encounter (Signed)
Carly [name] cancellled pt's appt due have mobility issues and unable to come to appt.

## 2022-04-30 ENCOUNTER — Ambulatory Visit: Payer: Self-pay | Admitting: Urology

## 2022-04-30 DIAGNOSIS — G4733 Obstructive sleep apnea (adult) (pediatric): Secondary | ICD-10-CM | POA: Diagnosis not present

## 2022-04-30 DIAGNOSIS — R262 Difficulty in walking, not elsewhere classified: Secondary | ICD-10-CM | POA: Diagnosis not present

## 2022-04-30 DIAGNOSIS — Z9181 History of falling: Secondary | ICD-10-CM | POA: Diagnosis not present

## 2022-04-30 DIAGNOSIS — F411 Generalized anxiety disorder: Secondary | ICD-10-CM | POA: Diagnosis not present

## 2022-04-30 DIAGNOSIS — Z741 Need for assistance with personal care: Secondary | ICD-10-CM | POA: Diagnosis not present

## 2022-04-30 DIAGNOSIS — Z9989 Dependence on other enabling machines and devices: Secondary | ICD-10-CM | POA: Diagnosis not present

## 2022-04-30 DIAGNOSIS — Z8673 Personal history of transient ischemic attack (TIA), and cerebral infarction without residual deficits: Secondary | ICD-10-CM | POA: Diagnosis not present

## 2022-04-30 DIAGNOSIS — R41841 Cognitive communication deficit: Secondary | ICD-10-CM | POA: Diagnosis not present

## 2022-04-30 DIAGNOSIS — G35 Multiple sclerosis: Secondary | ICD-10-CM | POA: Diagnosis not present

## 2022-04-30 DIAGNOSIS — R279 Unspecified lack of coordination: Secondary | ICD-10-CM | POA: Diagnosis not present

## 2022-04-30 DIAGNOSIS — F325 Major depressive disorder, single episode, in full remission: Secondary | ICD-10-CM | POA: Diagnosis not present

## 2022-04-30 DIAGNOSIS — M6281 Muscle weakness (generalized): Secondary | ICD-10-CM | POA: Diagnosis not present

## 2022-05-01 DIAGNOSIS — Z741 Need for assistance with personal care: Secondary | ICD-10-CM | POA: Diagnosis not present

## 2022-05-01 DIAGNOSIS — R279 Unspecified lack of coordination: Secondary | ICD-10-CM | POA: Diagnosis not present

## 2022-05-01 DIAGNOSIS — M6281 Muscle weakness (generalized): Secondary | ICD-10-CM | POA: Diagnosis not present

## 2022-05-01 DIAGNOSIS — G35 Multiple sclerosis: Secondary | ICD-10-CM | POA: Diagnosis not present

## 2022-05-01 DIAGNOSIS — R262 Difficulty in walking, not elsewhere classified: Secondary | ICD-10-CM | POA: Diagnosis not present

## 2022-05-01 DIAGNOSIS — Z9181 History of falling: Secondary | ICD-10-CM | POA: Diagnosis not present

## 2022-05-02 DIAGNOSIS — R262 Difficulty in walking, not elsewhere classified: Secondary | ICD-10-CM | POA: Diagnosis not present

## 2022-05-02 DIAGNOSIS — G35 Multiple sclerosis: Secondary | ICD-10-CM | POA: Diagnosis not present

## 2022-05-02 DIAGNOSIS — Z741 Need for assistance with personal care: Secondary | ICD-10-CM | POA: Diagnosis not present

## 2022-05-02 DIAGNOSIS — M6281 Muscle weakness (generalized): Secondary | ICD-10-CM | POA: Diagnosis not present

## 2022-05-02 DIAGNOSIS — R279 Unspecified lack of coordination: Secondary | ICD-10-CM | POA: Diagnosis not present

## 2022-05-02 DIAGNOSIS — Z9181 History of falling: Secondary | ICD-10-CM | POA: Diagnosis not present

## 2022-05-05 DIAGNOSIS — G35 Multiple sclerosis: Secondary | ICD-10-CM | POA: Diagnosis not present

## 2022-05-05 DIAGNOSIS — R262 Difficulty in walking, not elsewhere classified: Secondary | ICD-10-CM | POA: Diagnosis not present

## 2022-05-05 DIAGNOSIS — Z741 Need for assistance with personal care: Secondary | ICD-10-CM | POA: Diagnosis not present

## 2022-05-05 DIAGNOSIS — M6281 Muscle weakness (generalized): Secondary | ICD-10-CM | POA: Diagnosis not present

## 2022-05-05 DIAGNOSIS — Z9181 History of falling: Secondary | ICD-10-CM | POA: Diagnosis not present

## 2022-05-05 DIAGNOSIS — R279 Unspecified lack of coordination: Secondary | ICD-10-CM | POA: Diagnosis not present

## 2022-05-07 ENCOUNTER — Telehealth: Payer: Self-pay | Admitting: Neurology

## 2022-05-07 DIAGNOSIS — Z9181 History of falling: Secondary | ICD-10-CM | POA: Diagnosis not present

## 2022-05-07 DIAGNOSIS — M6281 Muscle weakness (generalized): Secondary | ICD-10-CM | POA: Diagnosis not present

## 2022-05-07 DIAGNOSIS — R262 Difficulty in walking, not elsewhere classified: Secondary | ICD-10-CM | POA: Diagnosis not present

## 2022-05-07 DIAGNOSIS — G35 Multiple sclerosis: Secondary | ICD-10-CM | POA: Diagnosis not present

## 2022-05-07 DIAGNOSIS — R279 Unspecified lack of coordination: Secondary | ICD-10-CM | POA: Diagnosis not present

## 2022-05-07 DIAGNOSIS — Z741 Need for assistance with personal care: Secondary | ICD-10-CM | POA: Diagnosis not present

## 2022-05-07 NOTE — Telephone Encounter (Signed)
Error

## 2022-05-08 ENCOUNTER — Ambulatory Visit: Payer: Medicare Other | Admitting: Dermatology

## 2022-05-08 DIAGNOSIS — G35 Multiple sclerosis: Secondary | ICD-10-CM | POA: Diagnosis not present

## 2022-05-09 DIAGNOSIS — G35 Multiple sclerosis: Secondary | ICD-10-CM | POA: Diagnosis not present

## 2022-05-09 DIAGNOSIS — Z741 Need for assistance with personal care: Secondary | ICD-10-CM | POA: Diagnosis not present

## 2022-05-09 DIAGNOSIS — Z9181 History of falling: Secondary | ICD-10-CM | POA: Diagnosis not present

## 2022-05-09 DIAGNOSIS — R262 Difficulty in walking, not elsewhere classified: Secondary | ICD-10-CM | POA: Diagnosis not present

## 2022-05-09 DIAGNOSIS — M6281 Muscle weakness (generalized): Secondary | ICD-10-CM | POA: Diagnosis not present

## 2022-05-09 DIAGNOSIS — R279 Unspecified lack of coordination: Secondary | ICD-10-CM | POA: Diagnosis not present

## 2022-05-12 DIAGNOSIS — F331 Major depressive disorder, recurrent, moderate: Secondary | ICD-10-CM | POA: Diagnosis not present

## 2022-05-12 DIAGNOSIS — F411 Generalized anxiety disorder: Secondary | ICD-10-CM | POA: Diagnosis not present

## 2022-05-14 ENCOUNTER — Ambulatory Visit (INDEPENDENT_AMBULATORY_CARE_PROVIDER_SITE_OTHER): Payer: Medicare Other | Admitting: Neurology

## 2022-05-14 ENCOUNTER — Ambulatory Visit: Payer: Self-pay | Admitting: Urology

## 2022-05-14 ENCOUNTER — Encounter: Payer: Self-pay | Admitting: Neurology

## 2022-05-14 VITALS — BP 132/69 | HR 89 | Ht 63.0 in | Wt 190.0 lb

## 2022-05-14 DIAGNOSIS — Z9989 Dependence on other enabling machines and devices: Secondary | ICD-10-CM

## 2022-05-14 DIAGNOSIS — Z7952 Long term (current) use of systemic steroids: Secondary | ICD-10-CM

## 2022-05-14 DIAGNOSIS — R269 Unspecified abnormalities of gait and mobility: Secondary | ICD-10-CM | POA: Diagnosis not present

## 2022-05-14 DIAGNOSIS — G35 Multiple sclerosis: Secondary | ICD-10-CM | POA: Diagnosis not present

## 2022-05-14 DIAGNOSIS — F418 Other specified anxiety disorders: Secondary | ICD-10-CM

## 2022-05-14 DIAGNOSIS — G4700 Insomnia, unspecified: Secondary | ICD-10-CM

## 2022-05-14 DIAGNOSIS — G4733 Obstructive sleep apnea (adult) (pediatric): Secondary | ICD-10-CM

## 2022-05-14 MED ORDER — DALFAMPRIDINE ER 10 MG PO TB12: 10.0000 mg | ORAL_TABLET | Freq: Two times a day (BID) | ORAL | 3 refills | Status: DC

## 2022-05-14 MED ORDER — MODAFINIL 200 MG PO TABS
200.0000 mg | ORAL_TABLET | Freq: Every day | ORAL | 5 refills | Status: DC
Start: 2022-05-14 — End: 2022-06-02

## 2022-05-14 NOTE — Progress Notes (Signed)
[facility]  PATIENT: Carly [name] DOB: [birth year]  REFERRING DOCTOR OR PCP: Einar Pheasant, MD SOURCE: Patient, notes from neurology Larence Penning, [facility], [facility]), imaging and lab reports, MRI images personally reviewed  _________________________________   HISTORICAL  CHIEF COMPLAINT:  Chief Complaint  Patient presents with   Follow-up    Rm 2, w Crista and husband. Pt reports worsening and increase in sx last 8 months. Around the time she came and solu medrol was d/c. Has been getting stiffer and has now  lost mobility. Unable to get up and move. Pt would like dalfampridine as a 90 day fill instead of a 30 day fill. Order placed.     HISTORY OF PRESENT ILLNESS:  Carly [name] is a 71 y.o. woman. 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  Update 05/14/2022: She states she is doing worse.   She feels everything is worse since starting the high dose steroid.    I told her again that I feel the risks of high dose steroids are higher than potential benefit.     She is also on dalfampridine and feels she gets some benefit from it.  She is not ale to take steps with a walker anymore.    She uses a wheelchair more.      Her PPMS seems to be mostly stable though she is noting more spasticity pain and continued severe anxiety.   She feels anxiety is still a big problem.  She is seeing a new psychiatry practice and some changes in medications were made this week.   She also notes depression.      Because anxiety was bad last month, I had her titrate off mirtazapine and start temazepam at bedtime.  We also had her re-try buspirone and continue the duloxetine.   She is also on Seroquel 50 mg at bedtime.   She takes 12.5 Seroquel once or twice during the day.  Sometimes the Seroquel makes her sleepy during the day and other times she tolerates it better.  It does not always help her anxiety during the day.  We tried a lower dose of  mirtazapine but she feels worse.   She also had trouble getting off Remeron in the past.   She feels anxiety is worse since going off the mirtazapine though her family/caregiver feel it is about the same.   She continues to report a lot of pain and spasms.     The anxiety is throughout the day and it contributes to her insomnia.  She feels she has a lot going on.  She recently moved to the [facility] in [place] - she is now in skilled nursing and husband is in independent apartment.   CPAP was started recently and she is trying to get used to a new CPAP mask - multiple masks have been tried (full facemask).        Currently, gait is poor  She has left > right leg weakness and spasticity. She was using a walker but no longer doing so.    She denies numbness or tingling   Bladder is fine.   She has reduced vision  She was diagnosed with OSA and just started Auto-PAP lin January 2023 with [facility] HST 08/08/2021 showed AHI = 44.    She sees Dr. Raul Del for the OSA.   She has excessive daytime sleepiness and feels it may be better on the CPAP.  She has tried a couple different masks and  feels the current mask has done best..     She uses CPAP 7 hours a night recently.   She has changed masks and feel current one is a little better.     She has gained weight over the last year.  Of note, she is on both mirtazapine and Seroquel.   MS HISTORY: She was diagnosed with MS in 2012 after presenting with progressive left greater than right leg weakness and reduced gait.  She had an MRI of the brain and was found to have severe white matter changes.  In retrospect, she noted that she had difficulties with her left leg including a limp for many years t before the MRI, much worse in hot weather.     She felt she fluctuated a lot without much progression for many years before she started to progress more continuously..     Because of the severe extent of white matter changes, not completely typical for MS  alternative diagnoses including CADASIL were considered.  The notch 3 PCR was negative.  CSF showed greater than 5 oligoclonal bands.  Therefore, she was diagnosed with primary progressive MS as her time course was more consistent with that then secondary progressive MS.  She had earlier been placed on Tysabri (Dr. Jacqulynn Cadet) around 2014 for a couple years and then switched to [facility] in 2017.  However she felt that she progressed more well on Ocrevus then not on the medication and it was stopped after 4 courses.  She more recently has been seeing Dr. Pamalee Leyden and is on once a month IV Solu-Medrol.  She does not think it is helping much.  She had a small stroke 11/28/2020 involving the deep white matter of the left frontal  lobe causing right arm weakness    She did have some improvement.   Imaging: MRI of the cervical spine 12/19/2010 showed foci adjacent to C2 to the left, C2-C3 posteriorly C3-C4 posterolaterally to the left, C5-C6 laterally bilaterally   The MRI was followed up with an MRI of the brain 01/07/2011.  And MRI 03/12/2016 both showing a stable pattern of severe white matter hyperintense signal changes with some involvement of the anterior temporal lobes, external capsules and basal ganglia.  The pattern is fairly symmetric.  She also has moderate cortical atrophy.  MRI of the cervical and thoracic spine 04/02/2018 show a similar extent of plaque in the cervical spine as in 2012 though some of the foci are more apparent.  The thoracic spine shows multiple foci including a large focus laterally to the left adjacent to T8-T9 likely playing a role in her left leg weakness  MRI of the brain 11/28/2020 shows a mostly stable pattern of severe white matter change and a couple foci in the pons and left cerebellar hemisphere.  The MRI also showed a small stroke in the left corona radiata with DWI changes much more consistent with infarction than MS.  Additionally there is atrophy that have progressed mildly  compared to 2017.  The MR angiogram 12/09/2020 showed mild stenosis in the PCAs but no major stenosis or occlusion.    REVIEW OF SYSTEMS: Constitutional: No fevers, chills, sweats, or change in appetite.  She has insomnia. Eyes: No visual changes, double vision, eye pain Ear, nose and throat: No hearing loss, ear pain, nasal congestion, sore throat Cardiovascular: No chest pain, palpitations Respiratory:  No shortness of breath at rest or with exertion.   No wheezes GastrointestinaI: No nausea, vomiting, diarrhea, abdominal pain, fecal incontinence  Genitourinary:  No dysuria, urinary retention or frequency.  No nocturia. Musculoskeletal:  No neck pain, back pain Integumentary: No rash, pruritus, skin lesions.  She has ankle edema, left greater than right Neurological: as above Psychiatric: She has severe anxiety. Endocrine: No palpitations, diaphoresis, change in appetite, change in weigh or increased thirst Hematologic/Lymphatic:  No anemia, purpura, petechiae. Allergic/Immunologic: No itchy/runny eyes, nasal congestion, recent allergic reactions, rashes  ALLERGIES: Allergies  Allergen Reactions   Ibuprofen Swelling and Other (See Comments)   Sulfamethoxazole-Trimethoprim Itching   Penicillin G Rash   Asa [Aspirin] Swelling   Buspirone Other (See Comments)    Headaches     Levofloxacin Other (See Comments)    "weakness"   Lexapro [Escitalopram Oxalate] Other (See Comments)    Weakness    Pollen Extract Hives   Ceftin [Cefuroxime Axetil] Rash   Penicillins Rash   Sulfa Antibiotics Rash    HOME MEDICATIONS:  Current Outpatient Medications:    acetaminophen (TYLENOL) 500 MG tablet, Take 1 tablet (500 mg total) by mouth every 4 (four) hours as needed., Disp: 30 tablet, Rfl: 0   albuterol (VENTOLIN HFA) 108 (90 Base) MCG/ACT inhaler, Inhale 2 puffs into the lungs every 6 (six) hours as needed., Disp: 18 g, Rfl: 0   amLODipine (NORVASC) 5 MG tablet, TAKE 1 TABLET BY MOUTH  DAILY., Disp: 90 tablet, Rfl: 1   azelastine (ASTELIN) 0.1 % nasal spray, SMARTSIG:1-2 Spray(s) Both Nares Twice Daily, Disp: , Rfl:    Baclofen 5 MG TABS, Take 1 tablet by mouth in the morning, at noon, in the evening, and at bedtime., Disp: 120 tablet, Rfl: 5   busPIRone (BUSPAR) 15 MG tablet, Take 1 tablet (15 mg total) by mouth 2 (two) times daily., Disp: 60 tablet, Rfl: 5   Cholecalciferol (D3-1000 PO), Take 2,000 Units by mouth daily., Disp: , Rfl:    clopidogrel (PLAVIX) 75 MG tablet, TAKE 1 TABLET (75 MG) BY MOUTH EVERY DAY, Disp: 90 tablet, Rfl: 1   Coenzyme Q10 (CO Q10) 100 MG CAPS, Take 1 capsule by mouth daily., Disp: , Rfl:    cyanocobalamin (,VITAMIN B-12,) 1000 MCG/ML injection, Inject 1 mL (1,000 mcg total) into the muscle every 30 (thirty) days., Disp: 1 mL, Rfl: 3   docusate sodium (COLACE) 100 MG capsule, Take 100 mg by mouth 2 (two) times daily., Disp: , Rfl:    DULoxetine (CYMBALTA) 60 MG capsule, Take 1 capsule (60 mg total) by mouth daily., Disp: 90 capsule, Rfl: 3   fluticasone (FLONASE) 50 MCG/ACT nasal spray, Place into the nose., Disp: , Rfl:    gabapentin (NEURONTIN) 100 MG capsule, Take 1 capsule (100 mg total) by mouth 2 (two) times daily. (Patient taking differently: Take 100 mg by mouth daily.), Disp: 60 capsule, Rfl: 11   losartan (COZAAR) 50 MG tablet, TAKE 1 TABLET BY MOUTH DAILY, Disp: 90 tablet, Rfl: 1   magnesium oxide (MAG-OX) 400 MG tablet, TAKE 1 TABLET BY MOUTH DAILY, Disp: 30 tablet, Rfl: 5   METAMUCIL FIBER PO, Take by mouth 2 (two) times daily., Disp: , Rfl:    modafinil (PROVIGIL) 200 MG tablet, Take 1 tablet (200 mg total) by mouth daily., Disp: 30 tablet, Rfl: 5   nystatin cream (MYCOSTATIN), Apply 1 application topically 2 (two) times daily., Disp: 30 g, Rfl: 11   omeprazole (PRILOSEC) 20 MG capsule, Take 20 mg by mouth 2 (two) times daily before a meal., Disp: , Rfl:    ondansetron (ZOFRAN) 4 MG tablet, Take 1  tablet (4 mg total) by mouth every 8  (eight) hours as needed for nausea or vomiting., Disp: 20 tablet, Rfl: 0   QUEtiapine (SEROQUEL) 25 MG tablet, Take 1 tablet (25 mg total) by mouth 2 (two) times daily., Disp: 180 tablet, Rfl: 3   rosuvastatin (CRESTOR) 5 MG tablet, TAKE 1 TABLET BY MOUTH EVERY MONDAY, WEDNESDAY AND FRIDAY., Disp: 90 tablet, Rfl: 1   saccharomyces boulardii (FLORASTOR) 250 MG capsule, Take 1 capsule (250 mg total) by mouth 2 (two) times daily., Disp: 60 capsule, Rfl: 0   sertraline (ZOLOFT) 50 MG tablet, Take 1 tablet (50 mg total) by mouth daily., Disp: 90 tablet, Rfl: 3   sodium chloride (OCEAN) 0.65 % nasal spray, Place 1-2 sprays into the nose as needed., Disp: , Rfl:    temazepam (RESTORIL) 15 MG capsule, Take 1 capsule (15 mg total) by mouth at bedtime as needed for sleep., Disp: 30 capsule, Rfl: 5   tiZANidine (ZANAFLEX) 4 MG tablet, Take 1 tablet (4 mg total) by mouth every 6 (six) hours as needed for muscle spasms., Disp: 30 tablet, Rfl: 11   dalfampridine 10 MG TB12, Take 1 tablet (10 mg total) by mouth every 12 (twelve) hours., Disp: 180 tablet, Rfl: 3 No current facility-administered medications for this visit.  Facility-Administered Medications Ordered in Other Visits:    0.9 %  sodium chloride infusion, , Intravenous, Continuous, Corcoran, Melissa C, MD, Last Rate: 10 mL/hr at 04/23/22 1127, Continued from Pre-op at 04/23/22 1127   acetaminophen (TYLENOL) tablet 650 mg, 650 mg, Oral, Once, Corcoran, Drue Second, MD  PAST MEDICAL HISTORY: Past Medical History:  Diagnosis Date   Allergy    Anxiety    Aspiration pneumonia ([place])    Depression    Frequent headaches    H/O   GERD (gastroesophageal reflux disease)    History of chicken pox    History of colon polyps    Hx of migraines    Hypertension    Lymphedema    Multiple sclerosis ([place]) 2011   OSA (obstructive sleep apnea)    PONV (postoperative nausea and vomiting)    Scoliosis     PAST SURGICAL HISTORY: Past Surgical History:   Procedure Laterality Date   BACK SURGERY     CHOLECYSTECTOMY     COLONOSCOPY N/A 04/23/2022   Procedure: COLONOSCOPY;  Surgeon: Toledo, Benay Pike, MD;  Location: [facility] ENDOSCOPY;  Service: Gastroenterology;  Laterality: N/A;   COLONOSCOPY WITH PROPOFOL N/A 05/18/2017   Procedure: COLONOSCOPY WITH PROPOFOL;  Surgeon: [name], MD;  Location: [facility] ENDOSCOPY;  Service: Endoscopy;  Laterality: N/A;   FOOT SURGERY  2015   GALLBLADDER SURGERY  2008   HARDWARE REMOVAL Left 02/14/2016   Procedure: LEFT FOOT REMOVAL DEEP IMPLANT;  Surgeon: [name], MD;  Location: [facility];  Service: Orthopedics;  Laterality: Left;   HERNIA REPAIR     Inguinal Hernia Repair   SPINE SURGERY  2014    FAMILY HISTORY: Family History  Problem Relation Age of Onset   Arthritis Mother    Hypertension Mother    Macular degeneration Mother    Hypertension Father    Hyperlipidemia Father    Heart disease Maternal Grandfather    Diabetes Maternal Grandfather    Kidney disease Paternal Grandmother     SOCIAL HISTORY:  Social History   Socioeconomic History   Marital status: Married    Spouse name: Bill   Number of children: 3   Years of education: 45  Highest education level: Associate degree: academic program  Occupational History   Not on file  Tobacco Use   Smoking status: Never   Smokeless tobacco: Never  Vaping Use   Vaping Use: Never used  Substance and Sexual Activity   Alcohol use: No    Alcohol/week: 0.0 standard drinks of alcohol   Drug use: No   Sexual activity: Not Currently  Other Topics Concern   Not on file  Social History Narrative   Lives with husband    Right handed   Caffeine: 1 cup of coffee in the AM, coke occa. Trying to come off caffeine    Social Determinants of Health   Financial Resource Strain: Low Risk  (01/07/2022)   Overall Financial Resource Strain (CARDIA)    Difficulty of Paying Living Expenses: Not hard at all  Food Insecurity: No  Food Insecurity (01/07/2022)   Hunger Vital Sign    Worried About Running Out of Food in the Last Year: Never true    Ran Out of Food in the Last Year: Never true  Transportation Needs: No Transportation Needs (01/07/2022)   PRAPARE - Hydrologist (Medical): No    Lack of Transportation (Non-Medical): No  Physical Activity: Insufficiently Active (01/07/2022)   Exercise Vital Sign    Days of Exercise per Week: 2 days    Minutes of Exercise per Session: 60 min  Stress: No Stress Concern Present (01/07/2022)   [place]    Feeling of Stress : Not at all  Social Connections: Unknown (01/07/2022)   Social Connection and Isolation Panel [NHANES]    Frequency of Communication with Friends and Family: Not on file    Frequency of Social Gatherings with Friends and Family: Not on file    Attends Religious Services: Not on file    Active Member of Clubs or Organizations: Not on file    Attends Archivist Meetings: Not on file    Marital Status: Married  Intimate Partner Violence: Not At Risk (01/07/2022)   Humiliation, Afraid, Rape, and Kick questionnaire    Fear of Current or Ex-Partner: No    Emotionally Abused: No    Physically Abused: No    Sexually Abused: No     PHYSICAL EXAM  Vitals:   05/14/22 1543  BP: 132/69  Pulse: 89  Weight: 190 lb (86.2 kg)  Height: '5\' 3"'$  (1.6 m)    Body mass index is 33.66 kg/m.   General: The patient is well-developed and well-nourished and in no acute distress   Extremities: She has bilateral edema involving the foot and ankle.    Musculoskeletal:  Back is nontender   Neurologic Exam   Mental status: Anxiety is better than last visit.  The patient is alert and oriented x 3 at the time of the examination. The patient has apparent normal recent and remote memory, with an apparently normal attention span and concentration ability.   Speech is  normal.   Cranial nerves: Extraocular movements are full.   Facial strength and sensation was normal.   No obvious hearing deficits are noted.   Motor:  Muscle bulk is normal.   Tone is increased in the legs, left greater than right. Strength is  5 / 5 in the arms, 4-/5 in the right iliopsoas and 4/5 elsewhere in the right leg, 3/5 in the left iliopsoas and 4/5 elsewhere in that leg   Sensory: Sensory testing is intact  to pinprick, soft touch and vibration sensation in all 4 extremities.   Coordination: Cerebellar testing reveals good finger-nose-finger but reduced left heel-to-shin.   Gait and station: She needed bilateral support to stand up and could just slightly move her legs but was unable to take actual steps.     Reflexes: Deep tendon reflexes are symmetric and normal in the arm but the left knee reflex is greater than the right and she has spread.        DIAGNOSTIC DATA (LABS, IMAGING, TESTING) - I reviewed patient records, labs, notes, testing and imaging myself where available.  Lab Results  Component Value Date   WBC 10.3 03/28/2022   HGB 13.4 03/28/2022   HCT 41.8 03/28/2022   MCV 83.9 03/28/2022   PLT 325 03/28/2022      Component Value Date/Time   NA 141 03/28/2022 1455   NA 141 11/12/2021 1147   NA 138 09/18/2014 1048   K 3.8 03/28/2022 1455   K 4.0 09/18/2014 1048   CL 106 03/28/2022 1455   CL 101 09/18/2014 1048   CO2 26 03/28/2022 1455   CO2 27 09/18/2014 1048   GLUCOSE 177 (H) 03/28/2022 1455   GLUCOSE 88 09/18/2014 1048   BUN 14 03/28/2022 1455   BUN 12 11/12/2021 1147   BUN 9 09/18/2014 1048   CREATININE 0.68 03/28/2022 1455   CREATININE 0.71 04/11/2020 0923   CALCIUM 9.5 03/28/2022 1455   CALCIUM 9.5 09/18/2014 1048   PROT 7.4 03/28/2022 1455   PROT 7.8 09/18/2014 1048   ALBUMIN 4.5 03/28/2022 1455   ALBUMIN 4.6 03/29/2018 1303   ALBUMIN 4.0 09/18/2014 1048   AST 23 03/28/2022 1455   AST 23 09/18/2014 1048   ALT 24 03/28/2022 1455   ALT  42 09/18/2014 1048   ALKPHOS 81 03/28/2022 1455   ALKPHOS 161 (H) 09/18/2014 1048   BILITOT 0.6 03/28/2022 1455   BILITOT 0.3 09/18/2014 1048   GFRNONAA >60 03/28/2022 1455   GFRNONAA >60 09/18/2014 1048   GFRAA >60 12/20/2017 2130   GFRAA >60 09/18/2014 1048   Lab Results  Component Value Date   CHOL 154 03/07/2022   HDL 69 03/07/2022   LDLCALC 58 03/07/2022   LDLDIRECT 127.0 08/17/2020   TRIG 160 (H) 03/07/2022   CHOLHDL 2.2 03/07/2022   Lab Results  Component Value Date   HGBA1C 5.8 (H) 03/07/2022   Lab Results  Component Value Date   IRWERXVQ00 867 03/07/2022   Lab Results  Component Value Date   TSH 2.050 03/07/2022       ASSESSMENT AND PLAN  MS (multiple sclerosis) (HCC)  OSA on CPAP - Plan: For home use only DME continuous positive airway pressure (CPAP)  Gait disturbance  Depression with anxiety  Insomnia, unspecified type  Long term systemic steroid user   She has had more difficulty with gait over the past 6 months.  This predated stopping the steroid though she feels stopping the steroid has made things worse.  We talked about the pros and cons of continuing intermittent steroids.  It would have long-term benefit for her MS though steroids do sometimes give people more energy and can make a person feel better.  I am reluctant to have her do high-dose steroids because of her risks of osteoporosis and fracture.  For now, she will remain off of high-dose steroids.  She will remain off a DMT for PPMS (Ocrevus had not helped in the past)  She sees psychiatry and some of her  medications have been changed.  She appears less anxious today than the previous visits.  I will add Provigil in the hope that helps for her sleepiness and fatigue.   She would like to switch her OSA management to our practice.  She has severe OSA and was started on AutoPap.  There is a lot of leakage.  I need to get a download before deciding on a neck step.  If she has a lot of central  apneas or if, despite using CPAP, there is still a lot of OSA we will need to consider putting her back in for a titration study as she might benefit from BiPAP or BiPAP ST.   Rtc 6 months  48-minute office visit with the majority of the time spent face-to-face for history and physical, discussion/counseling and decision-making.  Additional time with record review and documentation.  Teagan Ozawa A. Felecia Shelling, MD, PhD, FAAN Certified in Neurology, Clinical Neurophysiology, Sleep Medicine, Pain Medicine and Neuroimaging Director, [facility] [address], [place], [place] [address] [phone]

## 2022-05-14 NOTE — Progress Notes (Incomplete)
05/14/22 11:55 AM   Carly [name] 1951/03/24 [id number]  Referring provider:  Einar Pheasant, [address] [place],  [place] [address] No chief complaint on file.      HPI: Carly [name] is a 71 y.o.female who presents today for further evaluation of urinary frequency.   She was previously seen in the clinic on 08/31/2018 by me and was noted to have right hydronephrosis.At times she underwent a CT scan prior in 02/2018 which read as mild, chronic right UPJ obstruction.        PMH: Past Medical History:  Diagnosis Date   Allergy    Anxiety    Aspiration pneumonia (HCC)    Depression    Frequent headaches    H/O   GERD (gastroesophageal reflux disease)    History of chicken pox    History of colon polyps    Hx of migraines    Hypertension    Lymphedema    Multiple sclerosis ([place]) 2011   OSA (obstructive sleep apnea)    PONV (postoperative nausea and vomiting)    Scoliosis     Surgical History: Past Surgical History:  Procedure Laterality Date   BACK SURGERY     CHOLECYSTECTOMY     COLONOSCOPY N/A 04/23/2022   Procedure: COLONOSCOPY;  Surgeon: Toledo, Benay Pike, MD;  Location: [facility] ENDOSCOPY;  Service: Gastroenterology;  Laterality: N/A;   COLONOSCOPY WITH PROPOFOL N/A 05/18/2017   Procedure: COLONOSCOPY WITH PROPOFOL;  Surgeon: [name], MD;  Location: [facility] ENDOSCOPY;  Service: Endoscopy;  Laterality: N/A;   FOOT SURGERY  2015   GALLBLADDER SURGERY  2008   HARDWARE REMOVAL Left 02/14/2016   Procedure: LEFT FOOT REMOVAL DEEP IMPLANT;  Surgeon: [name], MD;  Location: [facility];  Service: Orthopedics;  Laterality: Left;   HERNIA REPAIR     Inguinal Hernia Repair   SPINE SURGERY  2014    Home Medications:  Allergies as of 05/14/2022       Reactions   Ibuprofen Swelling, Other (See Comments)   Sulfamethoxazole-trimethoprim Itching   Penicillin G Rash   Asa [aspirin] Swelling   Buspirone  Other (See Comments)   Headaches   Levofloxacin Other (See Comments)   "weakness"   Lexapro [escitalopram Oxalate] Other (See Comments)   Weakness   Pollen Extract Hives   Ceftin [cefuroxime Axetil] Rash   Penicillins Rash   Sulfa Antibiotics Rash        Medication List        Accurate as of May 14, 2022 11:55 AM. If you have any questions, ask your nurse or doctor.          acetaminophen 500 MG tablet Commonly known as: TYLENOL Take 1 tablet (500 mg total) by mouth every 4 (four) hours as needed.   albuterol 108 (90 Base) MCG/ACT inhaler Commonly known as: VENTOLIN HFA Inhale 2 puffs into the lungs every 6 (six) hours as needed.   amLODipine 5 MG tablet Commonly known as: NORVASC TAKE 1 TABLET BY MOUTH DAILY.   azelastine 0.1 % nasal spray Commonly known as: ASTELIN SMARTSIG:1-2 Spray(s) Both Nares Twice Daily   Baclofen 5 MG Tabs Take 1 tablet by mouth in the morning, at noon, in the evening, and at bedtime.   busPIRone 15 MG tablet Commonly known as: BUSPAR Take 1 tablet (15 mg total) by mouth 2 (two) times daily.   clopidogrel 75 MG tablet Commonly known as: PLAVIX TAKE 1 TABLET (75 MG) BY  MOUTH EVERY DAY   Co Q10 100 MG Caps Take 1 capsule by mouth daily.   cyanocobalamin 1000 MCG/ML injection Commonly known as: VITAMIN B12 Inject 1 mL (1,000 mcg total) into the muscle every 30 (thirty) days.   D3-1000 PO Take 2,000 Units by mouth daily.   dalfampridine 10 MG Tb12 Take 1 tablet (10 mg total) by mouth every 12 (twelve) hours.   docusate sodium 100 MG capsule Commonly known as: COLACE Take 100 mg by mouth 2 (two) times daily.   DULoxetine 60 MG capsule Commonly known as: Cymbalta Take 1 capsule (60 mg total) by mouth daily.   fluticasone 50 MCG/ACT nasal spray Commonly known as: FLONASE Place into the nose.   gabapentin 100 MG capsule Commonly known as: NEURONTIN Take 1 capsule (100 mg total) by mouth 2 (two) times daily. What  changed: when to take this   losartan 50 MG tablet Commonly known as: COZAAR TAKE 1 TABLET BY MOUTH DAILY   magnesium oxide 400 MG tablet Commonly known as: MAG-OX TAKE 1 TABLET BY MOUTH DAILY   METAMUCIL FIBER PO Take by mouth 2 (two) times daily.   nystatin cream Commonly known as: MYCOSTATIN Apply 1 application topically 2 (two) times daily.   omeprazole 20 MG capsule Commonly known as: PRILOSEC Take 20 mg by mouth 2 (two) times daily before a meal.   ondansetron 4 MG tablet Commonly known as: Zofran Take 1 tablet (4 mg total) by mouth every 8 (eight) hours as needed for nausea or vomiting.   QUEtiapine 25 MG tablet Commonly known as: SEROQUEL Take 1 tablet (25 mg total) by mouth 2 (two) times daily.   rosuvastatin 5 MG tablet Commonly known as: CRESTOR TAKE 1 TABLET BY MOUTH EVERY MONDAY, [place].   saccharomyces boulardii 250 MG capsule Commonly known as: FLORASTOR Take 1 capsule (250 mg total) by mouth 2 (two) times daily.   sertraline 50 MG tablet Commonly known as: Zoloft Take 1 tablet (50 mg total) by mouth daily.   sodium chloride 0.65 % nasal spray Commonly known as: OCEAN Place 1-2 sprays into the nose as needed.   temazepam 15 MG capsule Commonly known as: RESTORIL Take 1 capsule (15 mg total) by mouth at bedtime as needed for sleep.   tiZANidine 4 MG tablet Commonly known as: Zanaflex Take 1 tablet (4 mg total) by mouth every 6 (six) hours as needed for muscle spasms.        Allergies:  Allergies  Allergen Reactions   Ibuprofen Swelling and Other (See Comments)   Sulfamethoxazole-Trimethoprim Itching   Penicillin G Rash   Asa [Aspirin] Swelling   Buspirone Other (See Comments)    Headaches     Levofloxacin Other (See Comments)    "weakness"   Lexapro [Escitalopram Oxalate] Other (See Comments)    Weakness    Pollen Extract Hives   Ceftin [Cefuroxime Axetil] Rash   Penicillins Rash   Sulfa Antibiotics Rash     Family History: Family History  Problem Relation Age of Onset   Arthritis Mother    Hypertension Mother    Macular degeneration Mother    Hypertension Father    Hyperlipidemia Father    Heart disease Maternal Grandfather    Diabetes Maternal Grandfather    Kidney disease Paternal Grandmother     Social History:  reports that she has never smoked. She has never used smokeless tobacco. She reports that she does not drink alcohol and does not use drugs.  Physical Exam: There were no vitals taken for this visit.  Constitutional:  Alert and oriented, No acute distress. HEENT: [place] AT, moist mucus membranes.  Trachea midline, no masses. Cardiovascular: No clubbing, cyanosis, or edema. Respiratory: Normal respiratory effort, no increased work of breathing. Skin: No rashes, bruises or suspicious lesions. Neurologic: Grossly intact, no focal deficits, moving all 4 extremities. Psychiatric: Normal mood and affect.  Laboratory Data:  Lab Results  Component Value Date   CREATININE 0.68 03/28/2022   Lab Results  Component Value Date   HGBA1C 5.8 (H) 03/07/2022    Urinalysis   Pertinent Imaging:    Assessment & Plan:     No follow-ups on file.  I,Kailey Littlejohn,acting as a Education administrator for Hollice Espy, MD.,have documented all relevant documentation on the behalf of Hollice Espy, MD,as directed by  Hollice Espy, MD while in the presence of Hollice Espy, [place] [address] [place], [place] [address] [phone]

## 2022-05-15 ENCOUNTER — Telehealth: Payer: Self-pay | Admitting: Neurology

## 2022-05-15 ENCOUNTER — Encounter: Payer: Self-pay | Admitting: Hematology and Oncology

## 2022-05-15 DIAGNOSIS — M6281 Muscle weakness (generalized): Secondary | ICD-10-CM | POA: Diagnosis not present

## 2022-05-15 DIAGNOSIS — F411 Generalized anxiety disorder: Secondary | ICD-10-CM | POA: Diagnosis not present

## 2022-05-15 DIAGNOSIS — F325 Major depressive disorder, single episode, in full remission: Secondary | ICD-10-CM | POA: Diagnosis not present

## 2022-05-15 DIAGNOSIS — R279 Unspecified lack of coordination: Secondary | ICD-10-CM | POA: Diagnosis not present

## 2022-05-15 DIAGNOSIS — G35 Multiple sclerosis: Secondary | ICD-10-CM | POA: Diagnosis not present

## 2022-05-15 DIAGNOSIS — Z741 Need for assistance with personal care: Secondary | ICD-10-CM | POA: Diagnosis not present

## 2022-05-15 DIAGNOSIS — R41841 Cognitive communication deficit: Secondary | ICD-10-CM | POA: Diagnosis not present

## 2022-05-15 DIAGNOSIS — R262 Difficulty in walking, not elsewhere classified: Secondary | ICD-10-CM | POA: Diagnosis not present

## 2022-05-15 DIAGNOSIS — Z9181 History of falling: Secondary | ICD-10-CM | POA: Diagnosis not present

## 2022-05-15 DIAGNOSIS — Z8673 Personal history of transient ischemic attack (TIA), and cerebral infarction without residual deficits: Secondary | ICD-10-CM | POA: Diagnosis not present

## 2022-05-15 NOTE — Telephone Encounter (Signed)
Pt's caretaker [name] called stating that the pt's medication that was just prescribed to her yesterday has to be sent to the facility she is at. The orders and instructions need to be faxed to [facility] [phone] on cover sheet has to be attn to nurse for [employer] Phone number to nurses floor if any questions is [phone].

## 2022-05-15 NOTE — Telephone Encounter (Signed)
Faxed rx order for dalfampridine/modafinil to fax number below, received fax confirmation.

## 2022-05-15 NOTE — Telephone Encounter (Signed)
PA completed on CMM/[employer].  KEY: [id number] Will await determination

## 2022-05-15 NOTE — Telephone Encounter (Signed)
PA approved 05/15/2022 - 05/16/2023. PA# [id number] [phone] Non-Grandfathered [id number].

## 2022-05-16 ENCOUNTER — Encounter: Payer: Self-pay | Admitting: Urology

## 2022-05-16 DIAGNOSIS — Z741 Need for assistance with personal care: Secondary | ICD-10-CM | POA: Diagnosis not present

## 2022-05-16 DIAGNOSIS — M6281 Muscle weakness (generalized): Secondary | ICD-10-CM | POA: Diagnosis not present

## 2022-05-16 DIAGNOSIS — R279 Unspecified lack of coordination: Secondary | ICD-10-CM | POA: Diagnosis not present

## 2022-05-16 DIAGNOSIS — R262 Difficulty in walking, not elsewhere classified: Secondary | ICD-10-CM | POA: Diagnosis not present

## 2022-05-16 DIAGNOSIS — G35 Multiple sclerosis: Secondary | ICD-10-CM | POA: Diagnosis not present

## 2022-05-16 DIAGNOSIS — Z9181 History of falling: Secondary | ICD-10-CM | POA: Diagnosis not present

## 2022-05-16 NOTE — Telephone Encounter (Signed)
[facility]) faxing order back for physician's signature. Refaxing order to [phone]  Contact info: [phone]

## 2022-05-19 ENCOUNTER — Other Ambulatory Visit: Payer: Self-pay | Admitting: Neurology

## 2022-05-19 ENCOUNTER — Telehealth: Payer: Self-pay | Admitting: *Deleted

## 2022-05-19 DIAGNOSIS — Z9181 History of falling: Secondary | ICD-10-CM | POA: Diagnosis not present

## 2022-05-19 DIAGNOSIS — G4700 Insomnia, unspecified: Secondary | ICD-10-CM

## 2022-05-19 DIAGNOSIS — Z741 Need for assistance with personal care: Secondary | ICD-10-CM | POA: Diagnosis not present

## 2022-05-19 DIAGNOSIS — G4733 Obstructive sleep apnea (adult) (pediatric): Secondary | ICD-10-CM

## 2022-05-19 DIAGNOSIS — R262 Difficulty in walking, not elsewhere classified: Secondary | ICD-10-CM | POA: Diagnosis not present

## 2022-05-19 DIAGNOSIS — M6281 Muscle weakness (generalized): Secondary | ICD-10-CM | POA: Diagnosis not present

## 2022-05-19 DIAGNOSIS — G35 Multiple sclerosis: Secondary | ICD-10-CM | POA: Diagnosis not present

## 2022-05-19 DIAGNOSIS — R279 Unspecified lack of coordination: Secondary | ICD-10-CM | POA: Diagnosis not present

## 2022-05-19 NOTE — Telephone Encounter (Signed)
Called the caretaker, Ms. Williams,and reviewed the compliance data. Advised a titration study would need to be completed to determine the need for bipap.  They are agreeable to this plan.  She wanted to also mention this to [name] to determine if this is MS related. She had been having vision changes and she went and saw eye specialist who recommended lasik. However on Thursday she mentioned that she was having double vision. She has been having numbness in lips and weakness and stiffness in lower extremities, which they mentioned in the recent visit. She is questioning the double vision concern and whether could be MS related and if anything should be done further. She denies fever and a recent urine cx was negative. Advised I would run this information by [name] to get his thoughts.  They understand to be on lookout for a call from our sleep lab to get scheduled for the sleep test.

## 2022-05-19 NOTE — Telephone Encounter (Signed)
Re-faxed orders w/ MD signature. Received fax confirmation.

## 2022-05-19 NOTE — Telephone Encounter (Signed)
LVM for pt to call office. Also LVM for [name] (caregiver) at [phone] to call office.

## 2022-05-21 ENCOUNTER — Telehealth: Payer: Self-pay | Admitting: Neurology

## 2022-05-21 DIAGNOSIS — R262 Difficulty in walking, not elsewhere classified: Secondary | ICD-10-CM | POA: Diagnosis not present

## 2022-05-21 DIAGNOSIS — R279 Unspecified lack of coordination: Secondary | ICD-10-CM | POA: Diagnosis not present

## 2022-05-21 DIAGNOSIS — Z741 Need for assistance with personal care: Secondary | ICD-10-CM | POA: Diagnosis not present

## 2022-05-21 DIAGNOSIS — Z9181 History of falling: Secondary | ICD-10-CM | POA: Diagnosis not present

## 2022-05-21 DIAGNOSIS — G35 Multiple sclerosis: Secondary | ICD-10-CM | POA: Diagnosis not present

## 2022-05-21 DIAGNOSIS — M6281 Muscle weakness (generalized): Secondary | ICD-10-CM | POA: Diagnosis not present

## 2022-05-21 NOTE — Telephone Encounter (Signed)
Called back and spoke w/ [place]. Pt had concerns over SE of modafinil after reading up on it. Reassured her that medication ok per [name] and it is to help w/ fatigue r/t MS. She should take 1 tablet po qd.  [name] unsure if pharmacy received dalfampridine order. She will check on this.   She asked about getting sleep study scheduled. I reviewed chart and appears per Raquel Sarna, no auth required. Earley Brooke should be reaching out soon to schedule CPAP titration.   She also states pt/husband spoke and agreed to sign waiver that they discussed at last OV to receive solumedrol infusions. They can stop by office to sign and husband states he can sign as witness also if needed. Aware I will send to MD for review and will be back in touch about this.

## 2022-05-21 NOTE — Telephone Encounter (Signed)
Carly [name] is calling for Pt and she wants to clarification on medication modafinil (PROVIGIL) 200 MG tablet. Carly [name] also have other question from Pt.  Carly [name] is asking if nurse Terrence Dupont can call her back.

## 2022-05-22 ENCOUNTER — Ambulatory Visit (INDEPENDENT_AMBULATORY_CARE_PROVIDER_SITE_OTHER): Payer: Medicare Other | Admitting: Internal Medicine

## 2022-05-22 ENCOUNTER — Telehealth: Payer: Self-pay

## 2022-05-22 DIAGNOSIS — F419 Anxiety disorder, unspecified: Secondary | ICD-10-CM

## 2022-05-22 DIAGNOSIS — Z741 Need for assistance with personal care: Secondary | ICD-10-CM | POA: Diagnosis not present

## 2022-05-22 DIAGNOSIS — M6281 Muscle weakness (generalized): Secondary | ICD-10-CM | POA: Diagnosis not present

## 2022-05-22 DIAGNOSIS — Z8673 Personal history of transient ischemic attack (TIA), and cerebral infarction without residual deficits: Secondary | ICD-10-CM

## 2022-05-22 DIAGNOSIS — G4733 Obstructive sleep apnea (adult) (pediatric): Secondary | ICD-10-CM

## 2022-05-22 DIAGNOSIS — Z9989 Dependence on other enabling machines and devices: Secondary | ICD-10-CM

## 2022-05-22 DIAGNOSIS — Z9181 History of falling: Secondary | ICD-10-CM | POA: Diagnosis not present

## 2022-05-22 DIAGNOSIS — F339 Major depressive disorder, recurrent, unspecified: Secondary | ICD-10-CM | POA: Diagnosis not present

## 2022-05-22 DIAGNOSIS — G35 Multiple sclerosis: Secondary | ICD-10-CM | POA: Diagnosis not present

## 2022-05-22 DIAGNOSIS — K635 Polyp of colon: Secondary | ICD-10-CM | POA: Diagnosis not present

## 2022-05-22 DIAGNOSIS — R739 Hyperglycemia, unspecified: Secondary | ICD-10-CM | POA: Diagnosis not present

## 2022-05-22 DIAGNOSIS — F32A Depression, unspecified: Secondary | ICD-10-CM | POA: Diagnosis not present

## 2022-05-22 DIAGNOSIS — R262 Difficulty in walking, not elsewhere classified: Secondary | ICD-10-CM | POA: Diagnosis not present

## 2022-05-22 DIAGNOSIS — E7800 Pure hypercholesterolemia, unspecified: Secondary | ICD-10-CM | POA: Diagnosis not present

## 2022-05-22 DIAGNOSIS — I10 Essential (primary) hypertension: Secondary | ICD-10-CM | POA: Diagnosis not present

## 2022-05-22 DIAGNOSIS — R279 Unspecified lack of coordination: Secondary | ICD-10-CM | POA: Diagnosis not present

## 2022-05-22 NOTE — Telephone Encounter (Signed)
LVM for pt to call back to schedule sleep study.  

## 2022-05-22 NOTE — Progress Notes (Signed)
Patient ID: Carly [name], female   DOB: Jan 27, 1951, 71 y.o.   MRN: [id number]   Virtual Visit via telephone Note  All issues noted in this document were discussed and addressed.  No physical exam was performed (except for noted visual exam findings with Video Visits).   I connected with Carly [name] today by telephone and verified that I am speaking with the correct person using two identifiers. Location patient: home Location provider: work  Persons participating in the telephone visit: patient, provider  The limitations, risks, security and privacy concerns of performing an evaluation and management service by telephone and the availability of in person appointments have been discussed.  It has also been discussed with the patient that there may be a patient responsible charge related to this service. The patient expressed understanding and agreed to proceed.   Reason for visit: follow up appt  HPI: Follow up regarding increased stress/anxiety.  She has recently moved to [place] and now into skilled nursing.  Increased stress related to this.  Recently has increased zoloft to '100mg'$  q day.  Taking seroquel at bedtime and has '25mg'$  seroquel to take prn.  She is off cymbalta.  Off temazepam.  Increased stress recently with the decision to hold on regular solumedrol treatments.  Also recently prescribed provigil to help with fatigue.  Also needs to complete cpap titration.     ROS: See pertinent positives and negatives per HPI.  Past Medical History:  Diagnosis Date   Allergy    Anxiety    Aspiration pneumonia (HCC)    Depression    Frequent headaches    H/O   GERD (gastroesophageal reflux disease)    History of chicken pox    History of colon polyps    Hx of migraines    Hypertension    Lymphedema    Multiple sclerosis ([place]) 2011   OSA (obstructive sleep apnea)    PONV (postoperative nausea and vomiting)    Scoliosis     Past Surgical History:   Procedure Laterality Date   BACK SURGERY     CHOLECYSTECTOMY     COLONOSCOPY N/A 04/23/2022   Procedure: COLONOSCOPY;  Surgeon: Toledo, Benay Pike, MD;  Location: [facility] ENDOSCOPY;  Service: Gastroenterology;  Laterality: N/A;   COLONOSCOPY WITH PROPOFOL N/A 05/18/2017   Procedure: COLONOSCOPY WITH PROPOFOL;  Surgeon: [name], MD;  [facility] ENDOSCOPY;  Service: Endoscopy;  Laterality: N/A;   FOOT SURGERY  2015   GALLBLADDER SURGERY  2008   HARDWARE REMOVAL Left 02/14/2016   Procedure: LEFT FOOT REMOVAL DEEP IMPLANT;  Surgeon: [name], MD;  Location: [facility];  Service: Orthopedics;  Laterality: Left;   HERNIA REPAIR     Inguinal Hernia Repair   SPINE SURGERY  2014    Family History  Problem Relation Age of Onset   Arthritis Mother    Hypertension Mother    Macular degeneration Mother    Hypertension Father    Hyperlipidemia Father    Heart disease Maternal Grandfather    Diabetes Maternal Grandfather    Kidney disease Paternal Grandmother     SOCIAL HX: reviewed.    Current Outpatient Medications:    acetaminophen (TYLENOL) 500 MG tablet, Take 1 tablet (500 mg total) by mouth every 4 (four) hours as needed., Disp: 30 tablet, Rfl: 0   albuterol (VENTOLIN HFA) 108 (90 Base) MCG/ACT inhaler, Inhale 2 puffs into the lungs every 6 (six) hours as needed., Disp: 18 g, Rfl: 0  amLODipine (NORVASC) 5 MG tablet, TAKE 1 TABLET BY MOUTH DAILY., Disp: 90 tablet, Rfl: 1   azelastine (ASTELIN) 0.1 % nasal spray, SMARTSIG:1-2 Spray(s) Both Nares Twice Daily, Disp: , Rfl:    Baclofen 5 MG TABS, Take 1 tablet by mouth in the morning, at noon, in the evening, and at bedtime., Disp: 120 tablet, Rfl: 5   busPIRone (BUSPAR) 15 MG tablet, Take 1 tablet (15 mg total) by mouth 2 (two) times daily., Disp: 60 tablet, Rfl: 5   Cholecalciferol (D3-1000 PO), Take 2,000 Units by mouth daily., Disp: , Rfl:    clopidogrel (PLAVIX) 75 MG tablet, TAKE 1 TABLET (75 MG) BY MOUTH  EVERY DAY, Disp: 90 tablet, Rfl: 1   cyanocobalamin (,VITAMIN B-12,) 1000 MCG/ML injection, Inject 1 mL (1,000 mcg total) into the muscle every 30 (thirty) days., Disp: 1 mL, Rfl: 3   dalfampridine 10 MG TB12, Take 1 tablet (10 mg total) by mouth every 12 (twelve) hours., Disp: 180 tablet, Rfl: 3   docusate sodium (COLACE) 100 MG capsule, Take 100 mg by mouth 2 (two) times daily., Disp: , Rfl:    fluticasone (FLONASE) 50 MCG/ACT nasal spray, Place into the nose., Disp: , Rfl:    furosemide (LASIX) 20 MG tablet, Take 20 mg by mouth daily., Disp: , Rfl:    gabapentin (NEURONTIN) 100 MG capsule, Take 1 capsule (100 mg total) by mouth 2 (two) times daily. (Patient taking differently: Take 100 mg by mouth daily.), Disp: 60 capsule, Rfl: 11   losartan (COZAAR) 50 MG tablet, TAKE 1 TABLET BY MOUTH DAILY, Disp: 90 tablet, Rfl: 1   magnesium oxide (MAG-OX) 400 MG tablet, TAKE 1 TABLET BY MOUTH DAILY, Disp: 30 tablet, Rfl: 5   METAMUCIL FIBER PO, Take by mouth 2 (two) times daily., Disp: , Rfl:    modafinil (PROVIGIL) 200 MG tablet, Take 1 tablet (200 mg total) by mouth daily., Disp: 30 tablet, Rfl: 5   nystatin cream (MYCOSTATIN), Apply 1 application topically 2 (two) times daily., Disp: 30 g, Rfl: 11   omeprazole (PRILOSEC) 20 MG capsule, Take 20 mg by mouth 2 (two) times daily before a meal., Disp: , Rfl:    ondansetron (ZOFRAN) 4 MG tablet, Take 1 tablet (4 mg total) by mouth every 8 (eight) hours as needed for nausea or vomiting., Disp: 20 tablet, Rfl: 0   pantoprazole (PROTONIX) 40 MG tablet, Take 40 mg by mouth daily., Disp: , Rfl:    QUEtiapine (SEROQUEL) 25 MG tablet, Take 1 tablet (25 mg total) by mouth 2 (two) times daily. (Patient taking differently: Take 25 mg by mouth 2 (two) times daily. 25 mg in the morning PRN and 50 mg at bedtime), Disp: 180 tablet, Rfl: 3   QUEtiapine (SEROQUEL) 50 MG tablet, Take 50 mg by mouth at bedtime., Disp: , Rfl:    rosuvastatin (CRESTOR) 5 MG tablet, TAKE 1 TABLET  BY MOUTH EVERY MONDAY, WEDNESDAY AND FRIDAY., Disp: 90 tablet, Rfl: 1   saccharomyces boulardii (FLORASTOR) 250 MG capsule, Take 1 capsule (250 mg total) by mouth 2 (two) times daily., Disp: 60 capsule, Rfl: 0   sertraline (ZOLOFT) 100 MG tablet, Take 100 mg by mouth daily., Disp: , Rfl:    sodium chloride (OCEAN) 0.65 % nasal spray, Place 1-2 sprays into the nose as needed., Disp: , Rfl:    baclofen (LIORESAL) 10 MG tablet, Take by mouth., Disp: , Rfl:  No current facility-administered medications for this visit.  Facility-Administered Medications Ordered in Other Visits:  0.9 %  sodium chloride infusion, , Intravenous, Continuous, Corcoran, Melissa C, MD, Last Rate: 10 mL/hr at 04/23/22 1127, Continued from Pre-op at 04/23/22 1127   acetaminophen (TYLENOL) tablet 650 mg, 650 mg, Oral, Once, Corcoran, Drue Second, MD  EXAM:  GENERAL: alert.  Appears to be in no acute distress.  Answering questions appropriately.   MS: moves all visible extremities without noticeable abnormality  PSYCH/NEURO: pleasant and cooperative, no obvious depression or anxiety, speech and thought processing grossly intact  ASSESSMENT AND PLAN:  Discussed the following assessment and plan:  Problem List Items Addressed This Visit     Anxiety and depression    Medications being changed/adjusted.  Increased stress related to these changes.  On zoloft now.  Off cymbalta.  Taking seroquel.  Continue f/u with psychiatry.        Relevant Medications   sertraline (ZOLOFT) 100 MG tablet   Colon polyp    Colonoscopy 04/23/22:COLON POLYP, ASCENDING; COLD BIOPSY:  - POLYPOID FRAGMENT OF BENIGN COLONIC MUCOSA WITH SUPERFICIAL REACTIVE  CHANGES.  - NEGATIVE FOR DYSPLASIA AND MALIGNANCY.       Depression, recurrent ([place])    Followed by psychiatry.  On seroquel and zoloft.        Relevant Medications   sertraline (ZOLOFT) 100 MG tablet   History of CVA (cerebrovascular accident)    Continue risk factor modification.   Has been tolerating statin. Continue plavix.        Hypercholesterolemia    Continue crestor.  Low cholesterol diet and exercise.  Follow lipid panel and liver function tests.       Relevant Medications   furosemide (LASIX) 20 MG tablet   Hyperglycemia    Low-carb diet and exercise.  Follow metabolic panel and A5W.      Hypertension    Continue amlodipine.  Follow pressures.        Relevant Medications   furosemide (LASIX) 20 MG tablet   Multiple sclerosis ([place])    Followed by Dr Felecia Shelling.  In skilled nursing now - PT.  Increased stress related to stopping solumedrol treatments.  Discussed with her.  She plans to discuss with neurology.        OSA on CPAP    Has been trying to wear cpap.  Per note, cpap titration planned.        Return in about 6 weeks (around 07/03/2022) for follow up appt (46mn).   I discussed the assessment and treatment plan with the patient. The patient was provided an opportunity to ask questions and all were answered. The patient agreed with the plan and demonstrated an understanding of the instructions.   The patient was advised to call back or seek an in-person evaluation if the symptoms worsen or if the condition fails to improve as anticipated.  I provided 25 minutes of non-face-to-face time during this encounter.   CEinar Pheasant MD

## 2022-05-22 NOTE — Telephone Encounter (Signed)
Pt asking if can go back to Solumedrol and pt will sign any statement prepared. Would like a call back.

## 2022-05-23 ENCOUNTER — Encounter: Payer: Self-pay | Admitting: Neurology

## 2022-05-23 DIAGNOSIS — R262 Difficulty in walking, not elsewhere classified: Secondary | ICD-10-CM | POA: Diagnosis not present

## 2022-05-23 DIAGNOSIS — Z9181 History of falling: Secondary | ICD-10-CM | POA: Diagnosis not present

## 2022-05-23 DIAGNOSIS — M6281 Muscle weakness (generalized): Secondary | ICD-10-CM | POA: Diagnosis not present

## 2022-05-23 DIAGNOSIS — R279 Unspecified lack of coordination: Secondary | ICD-10-CM | POA: Diagnosis not present

## 2022-05-23 DIAGNOSIS — Z741 Need for assistance with personal care: Secondary | ICD-10-CM | POA: Diagnosis not present

## 2022-05-23 DIAGNOSIS — G35 Multiple sclerosis: Secondary | ICD-10-CM | POA: Diagnosis not present

## 2022-05-26 ENCOUNTER — Encounter: Payer: Self-pay | Admitting: Hematology and Oncology

## 2022-05-26 ENCOUNTER — Encounter
Payer: Medicare Other | Attending: Physical Medicine and Rehabilitation | Admitting: Physical Medicine and Rehabilitation

## 2022-05-26 DIAGNOSIS — G4733 Obstructive sleep apnea (adult) (pediatric): Secondary | ICD-10-CM | POA: Diagnosis not present

## 2022-05-26 DIAGNOSIS — F411 Generalized anxiety disorder: Secondary | ICD-10-CM | POA: Insufficient documentation

## 2022-05-26 DIAGNOSIS — R296 Repeated falls: Secondary | ICD-10-CM | POA: Diagnosis not present

## 2022-05-26 DIAGNOSIS — F418 Other specified anxiety disorders: Secondary | ICD-10-CM | POA: Insufficient documentation

## 2022-05-26 DIAGNOSIS — G4701 Insomnia due to medical condition: Secondary | ICD-10-CM | POA: Diagnosis not present

## 2022-05-26 NOTE — Progress Notes (Signed)
Subjective:    Patient ID: Carly [name], female    DOB: 05-29-1951, 71 y.o.   MRN: [id number]  HPI  An audio/video tele-health visit is felt to be the most appropriate encounter for this patient at this time. This is a follow up tele-visit via phone. The patient is at home. MD is at office. Prior to scheduling this appointment, our staff discussed the limitations of evaluation and management by telemedicine and the availability of in-person appointments. The patient expressed understanding and agreed to proceed.    Carly [name] is a 71 year old woman who presents for f/u of white matter periventricular CVA, presents for follow-up regarding lymphedema, anxiety,chronic fatigue, insomnia, OSA, and weight gain, and new lower extremity weakness today.    1) Anxiety:  -continues to be severe -she has been titrated up to '100mg'$  Zoloft -she asks what else she can do for this.  -her psychiatrist previously transitioned her from Seroquel back to benzodiazepine due to side effect profile of Seroquel, but this worsened her anxiety so we restarted it. She asks if she can increase Seroquel to '25mg'$  -she needs a new order for Seroquel as her current one only accounts for nighttime dose but she is also taking one prn during the day.  -she is currently a little out of it from the anesthesia for her recent colonoscopy -anxiety has been very severe -she started Zoloft this week and has not yet noted any benefit, she is a little discouraged as felt this should take effect sooner.  -she would like referral to Dr. Sonny Dandy who was recommended to her by [name], she has not yet heard from their office -she really likes following with [name] as well -she feels that her inability to see her grandkids is a trigger for her anxiety. She used to see them mmore in the past and does not know why she doesn;t get to any mores -she does not want to restart her [name] as she does not want the weight  gain associated with this medication -she has only been sleeping 4 hours per night and she feels this worsens her anxiety.  -she has been having a lot of anxiety related to her and her husband's move from their current home into a retirement home and she asks whether she may take the seroquel additionally as needed- once in the morning, once later in the day, and two at night -she is interested in following with a behavioral therapist -she and husband [name] were scared of side effects of Seroquel that were discussed- she has not noted any this far.  -she has been sleeping but her legs feel very stiff at night -she continues to take Cymbalta '60mg'$  -we tried weaning her off the mirtazepine but she experienced weakness and insomnia and wanted to restart- titrated back up to her former dose.  -she discussed with her husband, who is also present on this call today, and she would prefer to restart Seroquel and stop Lorazepam.  -she has not had any counseling and would like to restart this. She has an appointment with [name] in August and would like to start counseling earlier than this if possible.  -she loved speaking with [name] inpatient and would like to follow with him -she had symptoms of dizziness yesterday at the hairdresser and was asked to contact me regarding if this could be from the seroquel. She had had no other similar episodes since starting it.  -she has been sleeping well  with the Seroquel at night.  -she would like a list of foods that can help anxiety.  -she asks about the dietary advice I gave her last visit regarding anxiety. -she has been using essential oil.  -anxiety has been bad -off prednisone now -exercise helps her.  -she gets upset over things easily -she wants to be able to calm down.  -she needs refills of her cymbalta and [name]  2) MS: -has been having a lot of stiffness recently, we had discssed increasing the baclofen and this does appear to  have helped -EMS called twice due to difficulty getting her up off the floor -is receiving home therapy -she would like to see a different neurologist and asked for my recommendation   3) Lymphedema  -has not been using garment recently -has significant bilateral lower extremity  -has greatly improved with lymphedema therapy -she recently received a device that has helped so much that she was able to get off of Lasix.  -has ordered compression garments -does not eat processed foods or salt.  -she has ordered something from [employer] to give her lymphatic massage.  -improving but still very bothersome to her -discussed bloodwork today  4) HTN: -BP 138/79  5) Decreased appetite:  -resolved with increasing [name]  6) She developed pain in right side, and not sure if this is due to topamax or the antibiotics she is on to treat a respiratory infection.   7) Insomnia:  -sleeping poorly due to her CPAP mask, pressure is rising at night and this is causing mask leak. -sleeping about 5 hours per night and has been referred for another sleep study  -respiratory therapist will seeing her tomorrow.  -she asks about Inspire.  -she follows with [name] and she says he is also a sleep doctor -she hates to reduce the medicine.  -she has tried 5 different masks -the machine is very loud.  -she can sleep without the CPAP fine.   8) Chest congestion -she has been having symptoms of congestion for greater than 3 months -Her father saw 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 She received a call to schedule an appointment with [name] office and is thankful for the referral.  -she asks what else she can do to help with this -she has been through multiple rounds of antibiotics without benefit.  -also feeling diffuse joint aches.   9) GERD -has been experiencing after eating  10) Sinus congestion -pleased that her  CT results were normal.   11) OSA -she notes that she was found to have OSA on recent sleep study and she was tearful about this as she feels it is just another condition she has -sleeping 5 hours per night and has been referred for another sleep study -she was surprised as she feels that she sleeps well.  -she has note been sleeping well while using the CPAP mask and was told that O2 is leaking -she is working with a respiratory therapist -she feels anxiety about whether she will get enough sleep tonight, she has only been sleeping about 4 hours since using the CPAP -she has decided she does not want to pursue Inspire yet until she has learned more about it.  -she had a very productive visit with [name] and he advised her to try her best to tolerate the CPAP mask, which she is doing.  -followed up with pulmonology regarding [name] and was told she  would be a good candidate for this treatment, but she should prefer to continue trying CPAP mask before trying Inspire  12) Chronic fatigue -she was diagnosed with COVID 9 days ago and her breathing has improved but she has been feeling very fatigued -she has been taking the N-acetyl-cysteine supplement -she asks about taking injetable B12 as this really helped her son -she has not been wearing her CPAP as she is waiting for a new mask  13) Pain -takes Tylenol and baclofen -would like to get Dysport next available appointment  14) Lower extremity weakness: -improved -went to ED and there was no evidence of stroke. Thought to be due to her Risperdal -her legs feel stiff -no other focal deficits -she started Risperdal on Wednesday night and asks if this could be causing her weakness -[name] has been trying to wean her off Mirtazapine and Seroquel due to weight gain and onto Risperdal for her anxiety  15) Bilateral lower extremity pain -feels stiff and tight like spasticity -she asks if she can take extra Baclofen  16)  depression -she has not yet found Zoloft to be helpful and has given it a couple of weeks -dose has been increased to '100mg'$   17) frequent falls -she says she is being admitted to sNF due to recent frequent falls. -she has not had any more falls since admission  18) Nausea -needs refill of Zofran  Pain Inventory Average Pain 5 Pain Right Now 2 My pain is aching and no pain.  Sometimes spasm in legs at night because of MST  In the last 24 hours, has pain interfered with the following? General activity 4 Relation with others 7 Enjoyment of life 5  What TIME of day is your pain at its worst? night Sleep (in general) Fair  Pain is worse with: sitting and unsure Pain improves with: medication Relief from Meds: 5  Family History  Problem Relation Age of Onset   Arthritis Mother    Hypertension Mother    Macular degeneration Mother    Hypertension Father    Hyperlipidemia Father    Heart disease Maternal Grandfather    Diabetes Maternal Grandfather    Kidney disease Paternal Grandmother    Social History   Socioeconomic History   Marital status: Married    Spouse name: Engineer, technical sales   Number of children: 3   Years of education: 14   Highest education level: Associate degree: academic program  Occupational History   Not on file  Tobacco Use   Smoking status: Never   Smokeless tobacco: Never  Vaping Use   Vaping Use: Never used  Substance and Sexual Activity   Alcohol use: No    Alcohol/week: 0.0 standard drinks of alcohol   Drug use: No   Sexual activity: Not Currently  Other Topics Concern   Not on file  Social History Narrative   Lives with husband    Right handed   Caffeine: 1 cup of coffee in the AM, coke occa. Trying to come off caffeine    Social Determinants of Health   Financial Resource Strain: Low Risk  (01/07/2022)   Overall Financial Resource Strain (CARDIA)    Difficulty of Paying Living Expenses: Not hard at all  Food Insecurity: No Food Insecurity  (01/07/2022)   Hunger Vital Sign    Worried About Running Out of Food in the Last Year: Never true    Ran Out of Food in the Last Year: Never true  Transportation Needs: No Transportation Needs (  01/07/2022)   PRAPARE - Hydrologist (Medical): No    Lack of Transportation (Non-Medical): No  Physical Activity: Insufficiently Active (01/07/2022)   Exercise Vital Sign    Days of Exercise per Week: 2 days    Minutes of Exercise per Session: 60 min  Stress: No Stress Concern Present (01/07/2022)   [place]    Feeling of Stress : Not at all  Social Connections: Unknown (01/07/2022)   Social Connection and Isolation Panel [NHANES]    Frequency of Communication with Friends and Family: Not on file    Frequency of Social Gatherings with Friends and Family: Not on file    Attends Religious Services: Not on file    Active Member of Clubs or Organizations: Not on file    Attends Archivist Meetings: Not on file    Marital Status: Married   Past Surgical History:  Procedure Laterality Date   BACK SURGERY     CHOLECYSTECTOMY     COLONOSCOPY N/A 04/23/2022   Procedure: COLONOSCOPY;  Surgeon: Toledo, Benay Pike, MD;  Location: [facility] ENDOSCOPY;  Service: Gastroenterology;  Laterality: N/A;   COLONOSCOPY WITH PROPOFOL N/A 05/18/2017   Procedure: COLONOSCOPY WITH PROPOFOL;  Surgeon: [name], MD;  Location: [facility] ENDOSCOPY;  Service: Endoscopy;  Laterality: N/A;   FOOT SURGERY  2015   GALLBLADDER SURGERY  2008   HARDWARE REMOVAL Left 02/14/2016   Procedure: LEFT FOOT REMOVAL DEEP IMPLANT;  Surgeon: [name], MD;  Location: [place];  Service: Orthopedics;  Laterality: Left;   HERNIA REPAIR     Inguinal Hernia Repair   SPINE SURGERY  2014   Past Surgical History:  Procedure Laterality Date   BACK SURGERY     CHOLECYSTECTOMY     COLONOSCOPY N/A 04/23/2022   Procedure:  COLONOSCOPY;  Surgeon: Toledo, Benay Pike, MD;  Location: [facility] ENDOSCOPY;  Service: Gastroenterology;  Laterality: N/A;   COLONOSCOPY WITH PROPOFOL N/A 05/18/2017   Procedure: COLONOSCOPY WITH PROPOFOL;  Surgeon: [name], MD;  Location: [facility] ENDOSCOPY;  Service: Endoscopy;  Laterality: N/A;   FOOT SURGERY  2015   GALLBLADDER SURGERY  2008   HARDWARE REMOVAL Left 02/14/2016   Procedure: LEFT FOOT REMOVAL DEEP IMPLANT;  Surgeon: [name], MD;  Location: [place];  Service: Orthopedics;  Laterality: Left;   HERNIA REPAIR     Inguinal Hernia Repair   SPINE SURGERY  2014   Past Medical History:  Diagnosis Date   Allergy    Anxiety    Aspiration pneumonia (HCC)    Depression    Frequent headaches    H/O   GERD (gastroesophageal reflux disease)    History of chicken pox    History of colon polyps    Hx of migraines    Hypertension    Lymphedema    Multiple sclerosis ([place]) 2011   OSA (obstructive sleep apnea)    PONV (postoperative nausea and vomiting)    Scoliosis    There were no vitals taken for this visit.  Opioid Risk Score:   Fall Risk Score:  `1  Depression screen PHQ 2/9     05/22/2022    4:07 PM 04/09/2022   10:00 AM 01/07/2022    3:15 PM 11/05/2021   10:10 AM 08/27/2021   11:12 AM 08/15/2021   10:50 AM 07/19/2021    3:28 PM  Depression screen PHQ 2/9  Decreased Interest 3 1  0 0 2 0 1  Down, Depressed, Hopeless 3 1 0 1 2 0 1  PHQ - 2 Score 6 2 0 1 4 0 2  Altered sleeping 3 0       Tired, decreased energy 3 1       Change in appetite 3 0       Feeling bad or failure about yourself  3 0       Trouble concentrating 3 0       Moving slowly or fidgety/restless 0 0       Suicidal thoughts 1 0       PHQ-9 Score 22 3       Difficult doing work/chores Somewhat difficult Not difficult at all         Review of Systems  Constitutional: Negative.   HENT: Negative.    Eyes: Negative.   Respiratory:  Negative for cough.        Chest congestion   Cardiovascular: Negative.   Gastrointestinal: Negative.   Endocrine: Negative.   Genitourinary: Negative.   Musculoskeletal:  Positive for back pain and gait problem.  Skin: Negative.   Allergic/Immunologic: Negative.   Hematological:  Bruises/bleeds easily.       Plavix  Psychiatric/Behavioral:  Positive for dysphoric mood.   All other systems reviewed and are negative.      Objective:   Physical Exam Patient seen via phone     Assessment & Plan:  1) Anxiety: -discussed with patient the side effects of both Seroquel and Lorazepam, as well as potential interactions with other medications. -recommended drinking chamomile tea with saffron every evening -advised that saffron can be purchased from the spices section of her grocery section and just 1 strand daily can be effective if mood elevation, and it is very good for health.  -recommended increasing Seroquel to '25mg'$  HS and adding an additional '25mg'$  during the day that can be taken once daily or split in half to be taken as 12.'5mg'$  BID PRN.  -discussed referral to psychiatry, placed referral to Dr. Sonny Dandy who was recommended to her by [name], discussed that it may be at least a week before she hears from them. -continue Zoloft, discussed that this will take 2-4 weeks to have effect, reinforced that she may not feel benefit from this yet. Discussed that not everyone response to every medicine and there are alternative options we can try if this is not effective enough.  -discussed benefits of exercise and good nutrition -discussed her move because it is stressing her.  -discussed whether we should increase Seroquel due to her anxiety. Discussed that she is currently taking 12.'5mg'$  3 times per day PRN -discussed about her stress of seeing many different doctors -advised to always use the lowest dose of medications that she needs to minimize side effects -discussed the buspar that was started for her by Dr. Kerman Passey- she has not  yet noted benefits from this, advised that she does not need to take it if she does not feel benefits.  -referred to psychiatry in [facility] for CBT -reinforced importance of thinking positively.  -continue to encourage non-pharmacologic management approaches as well, such as meditation, applying lavender oil, and exercise- all of which she has been trying and which have been helping.  -discussed alternative anxiety medications.  -continue seroquel 12.'5mg'$  daily and '25mg'$  at night. Can decreased to '25mg'$  at night and assess positive/negative effects during the day. Can restart 1/2 tab during the day if anxiety is uncontrolled.  -  discontinue lorazepam.  -establish care with [name] for neuropsych counseling on 8/2. They really valued the appointment with him.  -discontinue topamax in case pain in right side is due to medication- discussed can also be due to antibiotics which she is taking for respiratory infection- discontinuing the topamax will help [place] know if it is the cause of her pain or not. -refilled [name] '30mg'$  HS.  -refilled Cymbalta '60mg'$  -Discussed exercise and meditation as tools to decrease anxiety. -Recommended Down Dog Yoga app -Discussed spending time outdoors. -Discussed positive re-framing of anxiety.  -Discussed the following foods that have been show to reduce anxiety: 1) [place] nuts, mushrooms, soy beans due to their high selenium content. Upper limit of toxicity of selenium is 460mg/day so no more than 3-4 bBolivianuts per day.  2) Fatty fish such as salmon, mackerel, sardines, trout, and herring- high in omega-3 fatty acids 3) Eggs- increases serotonin and dopamine 4) Pumpkin seeds- high in omega-3 fatty acids 5) dark chocolate- high in flavanols that increase blood flow to brain 6) turmeric- take with black pepper to increase absorption 7) chamomile tea- antioxidant and anti-inflammatory properties 8) yogurt without sugar- supports gut-brain axis 9) green tea-  contains L- theanine 10) blueberries- high in vitamin C and antioxidants 11) t[place] high in tryptophan which gets converted to serotonin 12) bell peppers- rich in vitamin C and antioxidants 13) citrus fruits- rich in vitamin C and antioxidants 14) almonds- high in vitamin E and healthy fats 15) chia seeds- high in omega-3 fatty acids  2) MS -continue home therapy to minimize stiffness/maximize mobility -f/u with MS specialist. Discussed her appointmenr with Dr. SKerman Passey  -provided with neurology referral -discussed trying lipoic acid  3) HTN:  -discussed good blood pressure previously BP has been 130s-150s/80s, recommended citrus foods and nuts, as much mobility as she can tolerate, log Bps daily and bring log to f/u appointment.  -continue current regimen and checking and checking BP.  -advised citrus foods and nuts to help lower BP. Discussed that once BP is consistently 120/80 we can wean her Losartan to '25mg'$ .   4) Bilateral lower extremity edema: - continue lymphedema therapy which is greatly helping.  -Lasix discontinued. Discussed that this should also improve her kidney function -advised low salt diet.  -prescribed new compression garments, will fax to company.  -discussed doing the compression garments early in the morning at 9am  -discussed that her kidney function is normal on last check in March.   5) Insomnia: -recommended tart cherry juice with dinner, valerian root and chamomile teas in evening, applying lavender drops to forehead at night. -recommended chamomile tea with saffron a couple hours before bedtime -continue [name] '30mg'$  HS- discussed that we will not try to wean off again since we saw how much this is benefittng her -discontinue amantadine.  -discussed considering the Inspire trial since he has tried the CPAP for a long time now and can not tolerate it.   6) Obesity BMI 33.13 -attempted to wean off [name] but she developed worsening insomnia,  anxiety, and decreased appetite that was undesirable to her, so have restarted the medication.  -discussed trial of avoiding gluten for 3 months and how this could potentially help with her weight loss, anxiety, and prevention/slowing of her chronic diseases. -discussed benefits of intermittent fasting- made goal to set a consistent dinner time and avoid snacking after this time. If she is hungry, choose a healthy snack of nuts, fruit, or vegetables.  -provided a link to a pdf  of Pete Escogue's musculoskeletal alignment exercises: [email or web address]   7) Chest congestion -referred for pulmonary consult with [name], who treated her father for pulmonary fibrosis. She has made an appointment to establish care with him.  -recommended drinking daily warm water with honey, lime and ginger -recommended drinking tea with holy Basil (Tulsi)  8) Sore throat: -continue salt water gargling  9) Sinus congestion: -recommended humidifier -drink 6-8 glasses of water per day.   10) fatigue -checked B12, folate, TSH, vitamin D, magnesium and discussed bloodwork with patient and husband: all in normal range except for suboptimal Vitamin D- high dose supplement prescribed -Recommended continuing NAC (N-Acetyl cysteine) '600mg'$  BID for her fatigue. Discussed its benefits in boosting glutathione and mitochondrial function. -prescribed monthly injectable B12 as patient would like to try this. Discussed that there are few risks to having elevated B12 levels -discussed that not wearing her CPAP is also likely contributing to her fatigue -discussed modafinil but she prefers to try the B12 first   11) OSA -discussed that OSA can contribute to daytime fatigue, weight gain, anxiety, and chronic conditions so it is good to get this condition treated. -recommended follow-up sleep study for re-eval -referred to Dr. Melida Quitter for eval for [facility] given  that she is having a hard time sleeping with her CPAP. Discussed her decision not to pursue Inspire at this time and instead try to tolerate her current mask as best as possible. Discussed her positive conversation with [name]. -advised her to talk to her respiratory therapist about fitting another mask -discussed Inspire but she says unfortunately it is not covered by insurance.  -discussed strap under the chin.  -discussed following up with pulmonology for Inspire.  -agreed with her decision to continue to try CPAP before pursuing Inspire due to her fears of the procedure  12) Low back pain: -recommended doTerra Deep Blue Essential oil and applied to area of pain today- discussed that this is made of natural plant oils. Shared by personal experience of benefit from use of this essential oil -provided a link to a pdf of Pete Escogue's musculoskeletal alignment exercises: [email or web address]   13) Lower extremity weakness: -discussed that this could be a possible side effect of the Risperdal.  -Recommended calling Dr. Garth Bigness office to let him know about the lower extremity weakness -discussed that she should go to the ED as unilateral lower extremity weakness could be a symptom of stroke or MS flare -discussed stopping Risperdal until she can talk with [name] -Discussed using Seroquel prn for anxiety in the meantime  14) Depression -continue Zoloft which she has had good response to in the past. Increase dose to '50mg'$ . 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 -recommended heat, range of motion, use of baclofen -will plan for 500U Dysport next visit -recommended increasing Baclofen to '15mg'$  TID PRN  16) Frequent falls -discussed  that her facility is planning to send her to her facility's SNF -discussed that she will need to have NFL2 form filled by her PCP -discussed that the rehab and exercise she gets there will also help with her depression  17) Nausea -refilled zofran  10 minutes spent in discussion of her frequent falls, lack of falls since SNF admission, anxiety and depression, insomnia, plan for repeat sleep  study, trying chamomile tea with saffron every evening to help her insomnia/depression

## 2022-05-26 NOTE — Telephone Encounter (Signed)
Called the caretaker and was able to review with her Dr Felecia Shelling was agreeable to 1 g IV solumedrol but that she needs to sig a consent stating she understands the risk of doing this longer term. I will place the order and provide to our infusion suite.  She asked about the titration study and advised the titration study was ready to be scheduled. Provided the phone number for her to call and schedule the titration study and the infusion.  Pt's care taker verbalized understanding.

## 2022-05-27 ENCOUNTER — Encounter (INDEPENDENT_AMBULATORY_CARE_PROVIDER_SITE_OTHER): Payer: Medicare Other | Admitting: Vascular Surgery

## 2022-05-27 DIAGNOSIS — R262 Difficulty in walking, not elsewhere classified: Secondary | ICD-10-CM | POA: Diagnosis not present

## 2022-05-27 DIAGNOSIS — R279 Unspecified lack of coordination: Secondary | ICD-10-CM | POA: Diagnosis not present

## 2022-05-27 DIAGNOSIS — Z9181 History of falling: Secondary | ICD-10-CM | POA: Diagnosis not present

## 2022-05-27 DIAGNOSIS — Z741 Need for assistance with personal care: Secondary | ICD-10-CM | POA: Diagnosis not present

## 2022-05-27 DIAGNOSIS — M6281 Muscle weakness (generalized): Secondary | ICD-10-CM | POA: Diagnosis not present

## 2022-05-27 DIAGNOSIS — G35 Multiple sclerosis: Secondary | ICD-10-CM | POA: Diagnosis not present

## 2022-05-27 NOTE — Telephone Encounter (Signed)
CPAP Titration- Medicare/BCBS state no auth req.  Patient is scheduled at [facility] for 06/17/22 at 9 pm.  Mailed packet to the patient.

## 2022-05-28 ENCOUNTER — Ambulatory Visit: Payer: Medicare Other | Admitting: Dermatology

## 2022-05-28 DIAGNOSIS — Z741 Need for assistance with personal care: Secondary | ICD-10-CM | POA: Diagnosis not present

## 2022-05-28 DIAGNOSIS — Z9181 History of falling: Secondary | ICD-10-CM | POA: Diagnosis not present

## 2022-05-28 DIAGNOSIS — G35 Multiple sclerosis: Secondary | ICD-10-CM | POA: Diagnosis not present

## 2022-05-28 DIAGNOSIS — M6281 Muscle weakness (generalized): Secondary | ICD-10-CM | POA: Diagnosis not present

## 2022-05-28 DIAGNOSIS — R262 Difficulty in walking, not elsewhere classified: Secondary | ICD-10-CM | POA: Diagnosis not present

## 2022-05-28 DIAGNOSIS — R279 Unspecified lack of coordination: Secondary | ICD-10-CM | POA: Diagnosis not present

## 2022-05-29 ENCOUNTER — Encounter: Payer: Medicare Other | Admitting: Physical Medicine and Rehabilitation

## 2022-05-29 DIAGNOSIS — R262 Difficulty in walking, not elsewhere classified: Secondary | ICD-10-CM | POA: Diagnosis not present

## 2022-05-29 DIAGNOSIS — M6281 Muscle weakness (generalized): Secondary | ICD-10-CM | POA: Diagnosis not present

## 2022-05-29 DIAGNOSIS — Z9181 History of falling: Secondary | ICD-10-CM | POA: Diagnosis not present

## 2022-05-29 DIAGNOSIS — R279 Unspecified lack of coordination: Secondary | ICD-10-CM | POA: Diagnosis not present

## 2022-05-29 DIAGNOSIS — G35 Multiple sclerosis: Secondary | ICD-10-CM | POA: Diagnosis not present

## 2022-05-29 DIAGNOSIS — Z741 Need for assistance with personal care: Secondary | ICD-10-CM | POA: Diagnosis not present

## 2022-05-30 DIAGNOSIS — R262 Difficulty in walking, not elsewhere classified: Secondary | ICD-10-CM | POA: Diagnosis not present

## 2022-05-30 DIAGNOSIS — G35 Multiple sclerosis: Secondary | ICD-10-CM | POA: Diagnosis not present

## 2022-05-30 DIAGNOSIS — Z741 Need for assistance with personal care: Secondary | ICD-10-CM | POA: Diagnosis not present

## 2022-05-30 DIAGNOSIS — Z9181 History of falling: Secondary | ICD-10-CM | POA: Diagnosis not present

## 2022-05-30 DIAGNOSIS — R279 Unspecified lack of coordination: Secondary | ICD-10-CM | POA: Diagnosis not present

## 2022-05-30 DIAGNOSIS — M6281 Muscle weakness (generalized): Secondary | ICD-10-CM | POA: Diagnosis not present

## 2022-06-01 ENCOUNTER — Encounter: Payer: Self-pay | Admitting: Internal Medicine

## 2022-06-01 NOTE — Assessment & Plan Note (Signed)
Followed by Dr Felecia Shelling.  In skilled nursing now - PT.  Increased stress related to stopping solumedrol treatments.  Discussed with her.  She plans to discuss with neurology.

## 2022-06-01 NOTE — Assessment & Plan Note (Signed)
Has been trying to wear cpap.  Per note, cpap titration planned.

## 2022-06-01 NOTE — Assessment & Plan Note (Signed)
Medications being changed/adjusted.  Increased stress related to these changes.  On zoloft now.  Off cymbalta.  Taking seroquel.  Continue f/u with psychiatry.

## 2022-06-01 NOTE — Assessment & Plan Note (Signed)
Low-carb diet and exercise.  Follow metabolic panel and A1c. 

## 2022-06-01 NOTE — Assessment & Plan Note (Signed)
Continue risk factor modification.  Has been tolerating statin. Continue plavix.   

## 2022-06-01 NOTE — Assessment & Plan Note (Signed)
Continue crestor.  Low cholesterol diet and exercise. Follow lipid panel and liver function tests.   

## 2022-06-01 NOTE — Assessment & Plan Note (Signed)
Colonoscopy 04/23/22:COLON POLYP, ASCENDING; COLD BIOPSY:  - POLYPOID FRAGMENT OF BENIGN COLONIC MUCOSA WITH SUPERFICIAL REACTIVE  CHANGES.  - NEGATIVE FOR DYSPLASIA AND MALIGNANCY.  

## 2022-06-01 NOTE — Assessment & Plan Note (Signed)
Followed by psychiatry.  On seroquel and zoloft.

## 2022-06-01 NOTE — Assessment & Plan Note (Signed)
Continue amlodipine.  Follow pressures.   

## 2022-06-02 ENCOUNTER — Telehealth: Payer: Self-pay | Admitting: Neurology

## 2022-06-02 ENCOUNTER — Telehealth: Payer: Self-pay | Admitting: Internal Medicine

## 2022-06-02 DIAGNOSIS — R279 Unspecified lack of coordination: Secondary | ICD-10-CM | POA: Diagnosis not present

## 2022-06-02 DIAGNOSIS — G35 Multiple sclerosis: Secondary | ICD-10-CM

## 2022-06-02 DIAGNOSIS — G4733 Obstructive sleep apnea (adult) (pediatric): Secondary | ICD-10-CM

## 2022-06-02 DIAGNOSIS — M6281 Muscle weakness (generalized): Secondary | ICD-10-CM | POA: Diagnosis not present

## 2022-06-02 DIAGNOSIS — Z9181 History of falling: Secondary | ICD-10-CM | POA: Diagnosis not present

## 2022-06-02 DIAGNOSIS — R262 Difficulty in walking, not elsewhere classified: Secondary | ICD-10-CM | POA: Diagnosis not present

## 2022-06-02 DIAGNOSIS — Z741 Need for assistance with personal care: Secondary | ICD-10-CM | POA: Diagnosis not present

## 2022-06-02 NOTE — Telephone Encounter (Signed)
Done and documented on previous phone note as well received confirmation fax went through.

## 2022-06-02 NOTE — Telephone Encounter (Signed)
Pt called wanting to know what the provider found out from [name]

## 2022-06-02 NOTE — Telephone Encounter (Signed)
Pt daughter [name] called back and asked Myriam Jacobson to please faxed to [phone] Attention : [place]

## 2022-06-02 NOTE — Telephone Encounter (Signed)
Pt daughter [name] called back and asked Myriam Jacobson to please faxed to [phone] Attention : [place]  Will forward DC order

## 2022-06-02 NOTE — Telephone Encounter (Signed)
Pt husband is calling and requesting a nurse give him a call about some medication.

## 2022-06-02 NOTE — Telephone Encounter (Signed)
Called the husband back. He states that pt has been taking the modafinil and finds this over stimulates her. He would really like to dc this medication and wanted to know if Dr Felecia Shelling would be ok with this plan. Advised that would not be a problem. He request that the order to dc modafinil be sent to [facility].   **Husband will call back and provide the fax # along with who I should attn the order to. I will wait for this information before sending the DC order.

## 2022-06-03 ENCOUNTER — Encounter (INDEPENDENT_AMBULATORY_CARE_PROVIDER_SITE_OTHER): Payer: Self-pay | Admitting: Vascular Surgery

## 2022-06-03 ENCOUNTER — Encounter: Payer: Self-pay | Admitting: Hematology and Oncology

## 2022-06-03 ENCOUNTER — Ambulatory Visit (INDEPENDENT_AMBULATORY_CARE_PROVIDER_SITE_OTHER): Payer: Medicare Other | Admitting: Vascular Surgery

## 2022-06-03 ENCOUNTER — Ambulatory Visit (INDEPENDENT_AMBULATORY_CARE_PROVIDER_SITE_OTHER): Payer: Medicare Other | Admitting: Neurology

## 2022-06-03 VITALS — BP 129/79 | HR 92 | Resp 18 | Ht 63.0 in | Wt 200.0 lb

## 2022-06-03 DIAGNOSIS — I10 Essential (primary) hypertension: Secondary | ICD-10-CM

## 2022-06-03 DIAGNOSIS — F411 Generalized anxiety disorder: Secondary | ICD-10-CM | POA: Diagnosis not present

## 2022-06-03 DIAGNOSIS — I890 Lymphedema, not elsewhere classified: Secondary | ICD-10-CM | POA: Diagnosis not present

## 2022-06-03 DIAGNOSIS — E7800 Pure hypercholesterolemia, unspecified: Secondary | ICD-10-CM | POA: Diagnosis not present

## 2022-06-03 DIAGNOSIS — G4700 Insomnia, unspecified: Secondary | ICD-10-CM

## 2022-06-03 DIAGNOSIS — G35 Multiple sclerosis: Secondary | ICD-10-CM | POA: Diagnosis not present

## 2022-06-03 DIAGNOSIS — M899 Disorder of bone, unspecified: Secondary | ICD-10-CM | POA: Insufficient documentation

## 2022-06-03 DIAGNOSIS — G4733 Obstructive sleep apnea (adult) (pediatric): Secondary | ICD-10-CM

## 2022-06-03 DIAGNOSIS — F331 Major depressive disorder, recurrent, moderate: Secondary | ICD-10-CM | POA: Diagnosis not present

## 2022-06-03 NOTE — Assessment & Plan Note (Signed)
lipid control important in reducing the progression of atherosclerotic disease. Continue statin therapy  

## 2022-06-03 NOTE — Assessment & Plan Note (Signed)
blood pressure control important in reducing the progression of atherosclerotic disease. On appropriate oral medications.  

## 2022-06-03 NOTE — Assessment & Plan Note (Signed)
The patient has diagnosed lymphedema and has previously seen the lymphedema clinic in the past to have done a very good job of getting her symptoms under control.  She uses compression socks daily, she uses her lymphedema pump daily, she does try to elevate her legs.  With her multiple other medical comorbidities, her activity level is poor and that certainly can worsen the swelling.  I do not see that she has had a venous reflux study to make sure there is not a component of venous insufficiency contributing, I think that would be reasonable to assess in the near future at her convenience.  If this is a negative venous work-up, she may benefit from going back to the lymphedema clinic for the manual massage which helped so much to begin with and continuing all of her other conservative therapies.

## 2022-06-03 NOTE — Progress Notes (Signed)
Patient ID: Carly [name], female   DOB: 03-27-1951, 71 y.o.   MRN: [id number]  Chief Complaint  Patient presents with   New Patient (Initial Visit)    Consult for lympedema    HPI Carly [name] is a 71 y.o. female.  I am asked to see the patient by Dr. Raul Del for evaluation of lymphedema.  The patient first noticed swelling in her legs over 20 years ago.  She has actually been seen by the lymphedema clinic and has been rigorously wearing compression socks, elevating her legs is much as tolerated, and she has a lymphedema punch which she uses regularly.  She previously had manual massage which she says was very helpful.  Her activity level has become poor because of her multiple comorbidities as described below and this is certainly resulted in some swelling in her legs.  Her swelling is reasonably well controlled with all of her conservative measures, but she does still have daily swelling.  She is very tired and fatigued and is getting a sleep study done tonight for sleep apnea.  She does not have open wounds or ulcerations on her legs.  She has no fevers or chills.  She has not had a functional venous assessment to her knowledge.     Past Medical History:  Diagnosis Date   Allergy    Anxiety    Aspiration pneumonia (HCC)    Depression    Frequent headaches    H/O   GERD (gastroesophageal reflux disease)    History of chicken pox    History of colon polyps    Hx of migraines    Hypertension    Lymphedema    Multiple sclerosis ([place]) 2011   OSA (obstructive sleep apnea)    PONV (postoperative nausea and vomiting)    Scoliosis     Past Surgical History:  Procedure Laterality Date   BACK SURGERY     CHOLECYSTECTOMY     COLONOSCOPY N/A 04/23/2022   Procedure: COLONOSCOPY;  Surgeon: Toledo, Benay Pike, MD;  Location: [facility] ENDOSCOPY;  Service: Gastroenterology;  Laterality: N/A;   COLONOSCOPY WITH PROPOFOL N/A 05/18/2017   Procedure: COLONOSCOPY WITH PROPOFOL;  Surgeon:  [name], MD;  Location: [facility] ENDOSCOPY;  Service: Endoscopy;  Laterality: N/A;   FOOT SURGERY  2015   GALLBLADDER SURGERY  2008   HARDWARE REMOVAL Left 02/14/2016   Procedure: LEFT FOOT REMOVAL DEEP IMPLANT;  Surgeon: [name], MD;  Location: [place];  Service: Orthopedics;  Laterality: Left;   HERNIA REPAIR     Inguinal Hernia Repair   SPINE SURGERY  2014     Family History  Problem Relation Age of Onset   Arthritis Mother    Hypertension Mother    Macular degeneration Mother    Hypertension Father    Hyperlipidemia Father    Heart disease Maternal Grandfather    Diabetes Maternal Grandfather    Kidney disease Paternal Grandmother       Social History   Tobacco Use   Smoking status: Never   Smokeless tobacco: Never  Vaping Use   Vaping Use: Never used  Substance Use Topics   Alcohol use: No    Alcohol/week: 0.0 standard drinks of alcohol   Drug use: No     Allergies  Allergen Reactions   Ibuprofen Swelling and Other (See Comments)   Sulfamethoxazole-Trimethoprim Itching   Penicillin G Rash   Asa [Aspirin] Swelling   Buspirone Other (See Comments)  Headaches     Levofloxacin Other (See Comments)    "weakness"   Lexapro [Escitalopram Oxalate] Other (See Comments)    Weakness    Pollen Extract Hives   Ceftin [Cefuroxime Axetil] Rash   Penicillins Rash   Sulfa Antibiotics Rash    Current Outpatient Medications  Medication Sig Dispense Refill   acetaminophen (TYLENOL) 500 MG tablet Take 1 tablet (500 mg total) by mouth every 4 (four) hours as needed. 30 tablet 0   albuterol (VENTOLIN HFA) 108 (90 Base) MCG/ACT inhaler Inhale 2 puffs into the lungs every 6 (six) hours as needed. 18 g 0   amLODipine (NORVASC) 5 MG tablet TAKE 1 TABLET BY MOUTH DAILY. 90 tablet 1   azelastine (ASTELIN) 0.1 % nasal spray SMARTSIG:1-2 Spray(s) Both Nares Twice Daily     baclofen (LIORESAL) 10 MG tablet Take by mouth.     Baclofen 5 MG TABS Take  1 tablet by mouth in the morning, at noon, in the evening, and at bedtime. 120 tablet 5   busPIRone (BUSPAR) 15 MG tablet Take 1 tablet (15 mg total) by mouth 2 (two) times daily. 60 tablet 5   Cholecalciferol (D3-1000 PO) Take 2,000 Units by mouth daily.     clopidogrel (PLAVIX) 75 MG tablet TAKE 1 TABLET (75 MG) BY MOUTH EVERY DAY 90 tablet 1   cyanocobalamin (,VITAMIN B-12,) 1000 MCG/ML injection Inject 1 mL (1,000 mcg total) into the muscle every 30 (thirty) days. 1 mL 3   dalfampridine 10 MG TB12 Take 1 tablet (10 mg total) by mouth every 12 (twelve) hours. 180 tablet 3   docusate sodium (COLACE) 100 MG capsule Take 100 mg by mouth 2 (two) times daily.     fluticasone (FLONASE) 50 MCG/ACT nasal spray Place into the nose.     furosemide (LASIX) 20 MG tablet Take 20 mg by mouth daily.     gabapentin (NEURONTIN) 100 MG capsule Take 1 capsule (100 mg total) by mouth 2 (two) times daily. (Patient taking differently: Take 100 mg by mouth daily.) 60 capsule 11   losartan (COZAAR) 50 MG tablet TAKE 1 TABLET BY MOUTH DAILY 90 tablet 1   magnesium oxide (MAG-OX) 400 MG tablet TAKE 1 TABLET BY MOUTH DAILY 30 tablet 5   METAMUCIL FIBER PO Take by mouth 2 (two) times daily.     nystatin cream (MYCOSTATIN) Apply 1 application topically 2 (two) times daily. 30 g 11   omeprazole (PRILOSEC) 20 MG capsule Take 20 mg by mouth 2 (two) times daily before a meal.     ondansetron (ZOFRAN) 4 MG tablet Take 1 tablet (4 mg total) by mouth every 8 (eight) hours as needed for nausea or vomiting. 20 tablet 0   pantoprazole (PROTONIX) 40 MG tablet Take 40 mg by mouth daily.     QUEtiapine (SEROQUEL) 25 MG tablet Take 1 tablet (25 mg total) by mouth 2 (two) times daily. (Patient taking differently: Take 25 mg by mouth 2 (two) times daily. 25 mg in the morning PRN and 50 mg at bedtime) 180 tablet 3   QUEtiapine (SEROQUEL) 50 MG tablet Take 50 mg by mouth at bedtime.     rosuvastatin (CRESTOR) 5 MG tablet TAKE 1 TABLET BY  MOUTH EVERY MONDAY, WEDNESDAY AND FRIDAY. 90 tablet 1   saccharomyces boulardii (FLORASTOR) 250 MG capsule Take 1 capsule (250 mg total) by mouth 2 (two) times daily. 60 capsule 0   sertraline (ZOLOFT) 100 MG tablet Take 100 mg by mouth daily.  sodium chloride (OCEAN) 0.65 % nasal spray Place 1-2 sprays into the nose as needed.     No current facility-administered medications for this visit.   Facility-Administered Medications Ordered in Other Visits  Medication Dose Route Frequency Provider Last Rate Last Admin   0.9 %  sodium chloride infusion   Intravenous Continuous Nolon Stalls C, MD 10 mL/hr at 04/23/22 1127 Continued from Pre-op at 04/23/22 1127   acetaminophen (TYLENOL) tablet 650 mg  650 mg Oral Once Lequita Asal, MD          REVIEW OF SYSTEMS (Negative unless checked)  Constitutional: '[]'$ Weight loss  '[]'$ Fever  '[]'$ Chills Cardiac: '[]'$ Chest pain   '[]'$ Chest pressure   '[]'$ Palpitations   '[]'$ Shortness of breath when laying flat   '[]'$ Shortness of breath at rest   '[]'$ Shortness of breath with exertion. Vascular:  '[]'$ Pain in legs with walking   '[]'$ Pain in legs at rest   '[]'$ Pain in legs when laying flat   '[]'$ Claudication   '[]'$ Pain in feet when walking  '[]'$ Pain in feet at rest  '[]'$ Pain in feet when laying flat   '[]'$ History of DVT   '[]'$ Phlebitis   '[x]'$ Swelling in legs   '[]'$ Varicose veins   '[]'$ Non-healing ulcers Pulmonary:   '[]'$ Uses home oxygen   '[]'$ Productive cough   '[]'$ Hemoptysis   '[]'$ Wheeze  '[]'$ COPD   '[]'$ Asthma Neurologic:  '[]'$ Dizziness  '[]'$ Blackouts   '[]'$ Seizures   '[]'$ History of stroke   '[]'$ History of TIA  '[]'$ Aphasia   '[]'$ Temporary blindness   '[]'$ Dysphagia   '[]'$ Weakness or numbness in arms   '[x]'$ Weakness or numbness in legs Musculoskeletal:  '[x]'$ Arthritis   '[]'$ Joint swelling   '[]'$ Joint pain   '[x]'$ Low back pain Hematologic:  '[]'$ Easy bruising  '[]'$ Easy bleeding   '[]'$ Hypercoagulable state   '[]'$ Anemic  '[]'$ Hepatitis Gastrointestinal:  '[]'$ Blood in stool   '[]'$ Vomiting blood  '[]'$ Gastroesophageal reflux/heartburn   '[]'$ Abdominal  pain Genitourinary:  '[]'$ Chronic kidney disease   '[]'$ Difficult urination  '[]'$ Frequent urination  '[]'$ Burning with urination   '[]'$ Hematuria Skin:  '[]'$ Rashes   '[]'$ Ulcers   '[]'$ Wounds Psychological:  '[x]'$ History of anxiety   '[x]'$  History of major depression.    Physical Exam BP 129/79 (BP Location: Left Arm)   Pulse 92   Resp 18   Ht '5\' 3"'$  (1.6 m)   Wt 200 lb (90.7 kg)   BMI 35.43 kg/m  Gen:  WD/WN, NAD Head: [place]/AT, No temporalis wasting.  Ear/Nose/Throat: Hearing grossly intact, nares w/o erythema or drainage, oropharynx w/o Erythema/Exudate Eyes: Conjunctiva clear, sclera non-icteric  Neck: trachea midline.  No JVD.  Pulmonary:  Good air movement, respirations not labored, no use of accessory muscles  Cardiac: RRR, no JVD Vascular:  Vessel Right Left  Radial Palpable Palpable                          DP Palpable Palpable  PT Palpable Palpable   Gastrointestinal:. No masses, surgical incisions, or scars. Musculoskeletal: M/S 5/5 throughout.  Extremities without ischemic changes.  No deformity or atrophy. In a wheelchair. Mild BLE edema. Neurologic: Sensation grossly intact in extremities.  Symmetrical.  Speech is fluent. Motor exam as listed above. Psychiatric: Judgment intact, flat affect Dermatologic: No rashes or ulcers noted.  No cellulitis or open wounds.    Radiology No results found.  Labs Recent Results (from the past 2160 hour(s))  CBC with Differential/Platelet     Status: None   Collection Time: 03/07/22  3:25 PM  Result Value Ref Range   WBC 7.3 3.4 -  10.8 x10E3/uL   RBC 4.69 3.77 - 5.28 x10E6/uL   Hemoglobin 12.6 11.1 - 15.9 g/dL   Hematocrit 38.2 34.0 - 46.6 %   MCV 81 79 - 97 fL   MCH 26.9 26.6 - 33.0 pg   MCHC 33.0 31.5 - 35.7 g/dL   RDW 14.9 11.7 - 15.4 %   Platelets 318 150 - 450 x10E3/uL   Neutrophils 63 Not Estab. %   Lymphs 28 Not Estab. %   Monocytes 6 Not Estab. %   Eos 2 Not Estab. %   Basos 1 Not Estab. %   Neutrophils Absolute 4.6 1.4 - 7.0  x10E3/uL   Lymphocytes Absolute 2.1 0.7 - 3.1 x10E3/uL   Monocytes Absolute 0.4 0.1 - 0.9 x10E3/uL   EOS (ABSOLUTE) 0.2 0.0 - 0.4 x10E3/uL   Basophils Absolute 0.1 0.0 - 0.2 x10E3/uL   Immature Granulocytes 0 Not Estab. %   Immature Grans (Abs) 0.0 0.0 - 0.1 x10E3/uL  Hemoglobin A1c     Status: Abnormal   Collection Time: 03/07/22  3:25 PM  Result Value Ref Range   Hgb A1c MFr Bld 5.8 (H) 4.8 - 5.6 %    Comment:          Prediabetes: 5.7 - 6.4          Diabetes: >6.4          Glycemic control for adults with diabetes: <7.0    Est. average glucose Bld gHb Est-mCnc 120 mg/dL  TSH     Status: None   Collection Time: 03/07/22  3:25 PM  Result Value Ref Range   TSH 2.050 0.450 - 4.500 uIU/mL  Lipid panel     Status: Abnormal   Collection Time: 03/07/22  3:25 PM  Result Value Ref Range   Cholesterol, Total 154 100 - 199 mg/dL   Triglycerides 160 (H) 0 - 149 mg/dL   HDL 69 >39 mg/dL   VLDL Cholesterol Cal 27 5 - 40 mg/dL   LDL Chol Calc (NIH) 58 0 - 99 mg/dL   Chol/HDL Ratio 2.2 0.0 - 4.4 ratio    Comment:                                   T. Chol/HDL Ratio                                             Men  Women                               1/2 Avg.Risk  3.4    3.3                                   Avg.Risk  5.0    4.4                                2X Avg.Risk  9.6    7.1                                3X Avg.Risk 23.4  11.0   Vitamin B12     Status: None   Collection Time: 03/07/22  3:25 PM  Result Value Ref Range   Vitamin B-12 682 232 - 1,245 pg/mL  Iron, TIBC and Ferritin Panel     Status: Abnormal   Collection Time: 03/07/22  3:25 PM  Result Value Ref Range   Total Iron Binding Capacity 377 250 - 450 ug/dL   UIBC 329 118 - 369 ug/dL   Iron 48 27 - 139 ug/dL   Iron Saturation 13 (L) 15 - 55 %   Ferritin 31 15 - 150 ng/mL  CBC     Status: None   Collection Time: 03/28/22  2:55 PM  Result Value Ref Range   WBC 10.3 4.0 - 10.5 K/uL   RBC 4.98 3.87 - 5.11 MIL/uL    Hemoglobin 13.4 12.0 - 15.0 g/dL   HCT 41.8 36.0 - 46.0 %   MCV 83.9 80.0 - 100.0 fL   MCH 26.9 26.0 - 34.0 pg   MCHC 32.1 30.0 - 36.0 g/dL   RDW 15.2 11.5 - 15.5 %   Platelets 325 150 - 400 K/uL   nRBC 0.0 0.0 - 0.2 %    Comment: Performed at [facility], [address] [place], [place] [address]  Differential     Status: Abnormal   Collection Time: 03/28/22  2:55 PM  Result Value Ref Range   Neutrophils Relative % 78 %   Neutro Abs 8.0 (H) 1.7 - 7.7 K/uL   Lymphocytes Relative 16 %   Lymphs Abs 1.6 0.7 - 4.0 K/uL   Monocytes Relative 5 %   Monocytes Absolute 0.5 0.1 - 1.0 K/uL   Eosinophils Relative 1 %   Eosinophils Absolute 0.1 0.0 - 0.5 K/uL   Basophils Relative 0 %   Basophils Absolute 0.0 0.0 - 0.1 K/uL   Immature Granulocytes 0 %   Abs Immature Granulocytes 0.03 0.00 - 0.07 K/uL    Comment: Performed at [facility], [address] [place], [place] [address]  Comprehensive metabolic panel     Status: Abnormal   Collection Time: 03/28/22  2:55 PM  Result Value Ref Range   Sodium 141 135 - 145 mmol/L   Potassium 3.8 3.5 - 5.1 mmol/L   Chloride 106 98 - 111 mmol/L   CO2 26 22 - 32 mmol/L   Glucose, Bld 177 (H) 70 - 99 mg/dL    Comment: Glucose reference range applies only to samples taken after fasting for at least 8 hours.   BUN 14 8 - 23 mg/dL   Creatinine, Ser 0.68 0.44 - 1.00 mg/dL   Calcium 9.5 8.9 - 10.3 mg/dL   Total Protein 7.4 6.5 - 8.1 g/dL   Albumin 4.5 3.5 - 5.0 g/dL   AST 23 15 - 41 U/L   ALT 24 0 - 44 U/L   Alkaline Phosphatase 81 38 - 126 U/L   Total Bilirubin 0.6 0.3 - 1.2 mg/dL   GFR, Estimated >60 >60 mL/min    Comment: (NOTE) Calculated using the CKD-EPI Creatinine Equation (2021)    Anion gap 9 5 - 15    Comment: Performed at [facility], [address] [place],  [address]  Ethanol     Status: None   Collection Time: 03/28/22  2:55 PM  Result Value Ref Range   Alcohol, Ethyl (B) <10 <10 mg/dL     Comment: (NOTE) Lowest detectable limit for serum alcohol is 10 mg/dL.  For medical purposes only. Performed at [facility], [address] [place], [place] [address]   Surgical pathology     Status: None   Collection Time: 04/23/22 12:06 PM  Result Value Ref Range   SURGICAL PATHOLOGY      SURGICAL PATHOLOGY CASE: [id number] PATIENT: [place] [facility] Surgical Pathology Report     Specimen Submitted: A. Colon fatty polyp, asc; cbx  Clinical History: Hx od adenomatous colonic polyps Z86.010. Internal hemorrhoids, diverticulosis.    DIAGNOSIS: A. COLON POLYP, ASCENDING; COLD BIOPSY: - POLYPOID FRAGMENT OF BENIGN COLONIC MUCOSA WITH SUPERFICIAL REACTIVE CHANGES. - NEGATIVE FOR DYSPLASIA AND MALIGNANCY.  Comment: The endoscopic impression of a submucosal lipoma is noted.  However, due to the superficial nature of the sampling, definite submucosal adipose tissue is not identified.  GROSS DESCRIPTION: A. Labeled: Cbx ascending colon fatty polyp rule out lipoma Received: Formalin Collection time: 12:06 PM on 04/23/2022 Placed into formalin time: 12:06 PM on 04/23/2022 Tissue fragment(s): 2 Size: Ranges from 0.2-0.3 cm Description: Tan soft tissue fragments Entirely submitted in 1 cassette.  CM 04/23/2022  Final Diagnosis performed by Allena Napoleon, MD.   Electronically signed 04/24/2022 8:21:09AM The electronic signature indicates that the named Attending Pathologist has evaluated the specimen Technical component performed at [facility], [address] [place] [address] Lab: [phone] Dir: Rush Farmer, MD, MMM  Professional component performed at [facility], [facility], [place], [place],  [address] Lab: [phone] Dir: Kathi Simpers, MD     Assessment/Plan:  Lymphedema The patient has diagnosed lymphedema and has previously seen the lymphedema clinic in the past to have done a very good job of getting  her symptoms under control.  She uses compression socks daily, she uses her lymphedema pump daily, she does try to elevate her legs.  With her multiple other medical comorbidities, her activity level is poor and that certainly can worsen the swelling.  I do not see that she has had a venous reflux study to make sure there is not a component of venous insufficiency contributing, I think that would be reasonable to assess in the near future at her convenience.  If this is a negative venous work-up, she may benefit from going back to the lymphedema clinic for the manual massage which helped so much to begin with and continuing all of her other conservative therapies.  MS (multiple sclerosis) (HCC) Poor mobility contributes to her lower extremity swelling  Hypertension blood pressure control important in reducing the progression of atherosclerotic disease. On appropriate oral medications.   Hypercholesterolemia lipid control important in reducing the progression of atherosclerotic disease. Continue statin therapy      Leotis Pain 06/03/2022, 3:30 PM   This note was created with Dragon medical transcription system.  Any errors from dictation are unintentional.

## 2022-06-03 NOTE — Assessment & Plan Note (Signed)
Poor mobility contributes to her lower extremity swelling

## 2022-06-05 DIAGNOSIS — M6281 Muscle weakness (generalized): Secondary | ICD-10-CM | POA: Diagnosis not present

## 2022-06-05 DIAGNOSIS — G35 Multiple sclerosis: Secondary | ICD-10-CM | POA: Diagnosis not present

## 2022-06-05 DIAGNOSIS — Z741 Need for assistance with personal care: Secondary | ICD-10-CM | POA: Diagnosis not present

## 2022-06-05 DIAGNOSIS — R279 Unspecified lack of coordination: Secondary | ICD-10-CM | POA: Diagnosis not present

## 2022-06-05 DIAGNOSIS — Z9181 History of falling: Secondary | ICD-10-CM | POA: Diagnosis not present

## 2022-06-05 DIAGNOSIS — R262 Difficulty in walking, not elsewhere classified: Secondary | ICD-10-CM | POA: Diagnosis not present

## 2022-06-06 DIAGNOSIS — Z741 Need for assistance with personal care: Secondary | ICD-10-CM | POA: Diagnosis not present

## 2022-06-06 DIAGNOSIS — R262 Difficulty in walking, not elsewhere classified: Secondary | ICD-10-CM | POA: Diagnosis not present

## 2022-06-06 DIAGNOSIS — R279 Unspecified lack of coordination: Secondary | ICD-10-CM | POA: Diagnosis not present

## 2022-06-06 DIAGNOSIS — M6281 Muscle weakness (generalized): Secondary | ICD-10-CM | POA: Diagnosis not present

## 2022-06-06 DIAGNOSIS — Z9181 History of falling: Secondary | ICD-10-CM | POA: Diagnosis not present

## 2022-06-06 DIAGNOSIS — G35 Multiple sclerosis: Secondary | ICD-10-CM | POA: Diagnosis not present

## 2022-06-06 NOTE — Telephone Encounter (Signed)
See me before calling Ms Hazelett.  Reviewed.  When I spoke to her she was concerned about taking modafinil and about not being able to continue her steroid treatments.  In reviewing, please confirm if still taking modafinil.  (It appears order has been faxed to stop, but will need to clarify with pt if still taking).  Also, per note, Dr Felecia Shelling is going to give her steroids with her signing waiver.  (Again need to confirm if this is correct).

## 2022-06-09 DIAGNOSIS — Z9181 History of falling: Secondary | ICD-10-CM | POA: Diagnosis not present

## 2022-06-09 DIAGNOSIS — G35 Multiple sclerosis: Secondary | ICD-10-CM | POA: Diagnosis not present

## 2022-06-09 DIAGNOSIS — R279 Unspecified lack of coordination: Secondary | ICD-10-CM | POA: Diagnosis not present

## 2022-06-09 DIAGNOSIS — Z741 Need for assistance with personal care: Secondary | ICD-10-CM | POA: Diagnosis not present

## 2022-06-09 DIAGNOSIS — M6281 Muscle weakness (generalized): Secondary | ICD-10-CM | POA: Diagnosis not present

## 2022-06-09 DIAGNOSIS — R262 Difficulty in walking, not elsewhere classified: Secondary | ICD-10-CM | POA: Diagnosis not present

## 2022-06-10 DIAGNOSIS — Z741 Need for assistance with personal care: Secondary | ICD-10-CM | POA: Diagnosis not present

## 2022-06-10 DIAGNOSIS — R279 Unspecified lack of coordination: Secondary | ICD-10-CM | POA: Diagnosis not present

## 2022-06-10 DIAGNOSIS — M6281 Muscle weakness (generalized): Secondary | ICD-10-CM | POA: Diagnosis not present

## 2022-06-10 DIAGNOSIS — Z9181 History of falling: Secondary | ICD-10-CM | POA: Diagnosis not present

## 2022-06-10 DIAGNOSIS — R262 Difficulty in walking, not elsewhere classified: Secondary | ICD-10-CM | POA: Diagnosis not present

## 2022-06-10 DIAGNOSIS — G35 Multiple sclerosis: Secondary | ICD-10-CM | POA: Diagnosis not present

## 2022-06-10 NOTE — Telephone Encounter (Signed)
Patient returned office phone call. 

## 2022-06-10 NOTE — Telephone Encounter (Signed)
LMTCB

## 2022-06-11 DIAGNOSIS — Z741 Need for assistance with personal care: Secondary | ICD-10-CM | POA: Diagnosis not present

## 2022-06-11 DIAGNOSIS — R262 Difficulty in walking, not elsewhere classified: Secondary | ICD-10-CM | POA: Diagnosis not present

## 2022-06-11 DIAGNOSIS — Z9181 History of falling: Secondary | ICD-10-CM | POA: Diagnosis not present

## 2022-06-11 DIAGNOSIS — G35 Multiple sclerosis: Secondary | ICD-10-CM | POA: Diagnosis not present

## 2022-06-11 DIAGNOSIS — M6281 Muscle weakness (generalized): Secondary | ICD-10-CM | POA: Diagnosis not present

## 2022-06-11 DIAGNOSIS — R279 Unspecified lack of coordination: Secondary | ICD-10-CM | POA: Diagnosis not present

## 2022-06-11 DIAGNOSIS — H6123 Impacted cerumen, bilateral: Secondary | ICD-10-CM | POA: Diagnosis not present

## 2022-06-13 DIAGNOSIS — R41841 Cognitive communication deficit: Secondary | ICD-10-CM | POA: Diagnosis not present

## 2022-06-13 DIAGNOSIS — F411 Generalized anxiety disorder: Secondary | ICD-10-CM | POA: Diagnosis not present

## 2022-06-13 DIAGNOSIS — R2681 Unsteadiness on feet: Secondary | ICD-10-CM | POA: Diagnosis not present

## 2022-06-13 DIAGNOSIS — Z741 Need for assistance with personal care: Secondary | ICD-10-CM | POA: Diagnosis not present

## 2022-06-13 DIAGNOSIS — R262 Difficulty in walking, not elsewhere classified: Secondary | ICD-10-CM | POA: Diagnosis not present

## 2022-06-13 DIAGNOSIS — M6281 Muscle weakness (generalized): Secondary | ICD-10-CM | POA: Diagnosis not present

## 2022-06-13 DIAGNOSIS — G4733 Obstructive sleep apnea (adult) (pediatric): Secondary | ICD-10-CM | POA: Diagnosis not present

## 2022-06-13 DIAGNOSIS — R279 Unspecified lack of coordination: Secondary | ICD-10-CM | POA: Diagnosis not present

## 2022-06-13 DIAGNOSIS — I10 Essential (primary) hypertension: Secondary | ICD-10-CM | POA: Diagnosis not present

## 2022-06-13 DIAGNOSIS — Z8673 Personal history of transient ischemic attack (TIA), and cerebral infarction without residual deficits: Secondary | ICD-10-CM | POA: Diagnosis not present

## 2022-06-13 DIAGNOSIS — G4701 Insomnia due to medical condition: Secondary | ICD-10-CM | POA: Diagnosis not present

## 2022-06-13 DIAGNOSIS — Z9181 History of falling: Secondary | ICD-10-CM | POA: Diagnosis not present

## 2022-06-13 DIAGNOSIS — G35 Multiple sclerosis: Secondary | ICD-10-CM | POA: Diagnosis not present

## 2022-06-13 DIAGNOSIS — I890 Lymphedema, not elsewhere classified: Secondary | ICD-10-CM | POA: Diagnosis not present

## 2022-06-13 DIAGNOSIS — F418 Other specified anxiety disorders: Secondary | ICD-10-CM | POA: Diagnosis not present

## 2022-06-13 DIAGNOSIS — F32A Depression, unspecified: Secondary | ICD-10-CM | POA: Diagnosis not present

## 2022-06-13 NOTE — Telephone Encounter (Signed)
FYI I called and spoke with [place] caregiver along with patient. She stated that patient did stop taking the modafinil as was doing more harm than good. [name] also spoke woith Dr. Garth Bigness office yesterday & that patient was to see him on Tuesday. She will be signing waiver to go back on steroids. Husband & caregiver as going with to help with signing as hard for patient. If we ever need to reach [place] since she is able to help navigate was is going on with patient her number is [phone] & I will as to chart as well.

## 2022-06-16 DIAGNOSIS — M6281 Muscle weakness (generalized): Secondary | ICD-10-CM | POA: Diagnosis not present

## 2022-06-16 DIAGNOSIS — R279 Unspecified lack of coordination: Secondary | ICD-10-CM | POA: Diagnosis not present

## 2022-06-16 DIAGNOSIS — Z9181 History of falling: Secondary | ICD-10-CM | POA: Diagnosis not present

## 2022-06-16 DIAGNOSIS — R2681 Unsteadiness on feet: Secondary | ICD-10-CM | POA: Diagnosis not present

## 2022-06-16 DIAGNOSIS — Z8673 Personal history of transient ischemic attack (TIA), and cerebral infarction without residual deficits: Secondary | ICD-10-CM | POA: Diagnosis not present

## 2022-06-16 DIAGNOSIS — G35 Multiple sclerosis: Secondary | ICD-10-CM | POA: Diagnosis not present

## 2022-06-17 DIAGNOSIS — G35 Multiple sclerosis: Secondary | ICD-10-CM | POA: Diagnosis not present

## 2022-06-17 NOTE — Telephone Encounter (Signed)
Patient is asking for sleep study results. Patient states having issues sleeping. Would like tobe contacted with results as soon as possible.

## 2022-06-17 NOTE — Progress Notes (Signed)
General Information  Name: Carly, [name] BMI: 33.66 Physician: ,   ID: [id number] Height: 63.0 in Technician: Forde Radon  Sex: Female Weight: 190.0 lb Record: [id number]  Age: 71 [Nov 26, 1950] Date: 06/03/2022 Scorer: Forde Radon  Medical & Medication History     CPAP was started recently and she is trying to get used to a new CPAP mask - multiple masks have been tried (full facemask). She was diagnosed with OSA and just started Auto-PAP lin January 2023 with ARES HST 08/08/2021 showed AHI = 44. She sees Dr. Raul Del for the OSA. She has excessive daytime sleepiness and feels it may be better on the CPAP. AHI is still elevated at 20.4 on APAP 7-20 cm.  She has tried a couple different masks and feels the current mask has done best. She uses CPAP 7 hours a night recently. She has changed masks and feel current one is a little better.   She has gained weight over the last year. Of note, she is on both mirtazapine and Seroquel.   Tylenol, Ventolin, Norvasc, Astelin, Baclofen, Buspar, Vitamin D3, Plavix, CO Q10, Vitamin B12, Dalfampridine, Colace, Cymbalta, Metamucil, Flonase, Neurontin, Cozaar, Mag-Ox, Provigil, Mycostatin, Prilosec, Zofran, Seroquel, Crestor, Florastor, Ocean, ZOloft, Restoril, Zanaflex  ASSESSMENT: Severe OSA.  Titration from +7 cm H2O to 19 cm H2O was performed. At + 18 cm, AHI was only 3.9 during supine sleep.   PLAN: CPAP + 18 cm. Download in next 30-90 days Follow-up with [name]            Comments   Patient arrived at the sleep lab with a caregiver for a CPAP study. She is currently on CPAP, but has residual AHI even with maximum pressures, and wonders if BiPAP would help. 4% desat rule used. Nursing home administered trazadone before patient left the facility, and patient is feeling very sleepy at arrival. Patient and caregiver expressed frustration at being given the medication so early, rather than taking it at the sleep lab. Patient brought her own mask,  which she has had some luck with. Patient was willing to try an Benancio Deeds to see if she liked that, but after several minutes, she found it to be uncomfortable. She stated she has tried around 12 different masks, as well as a chin strap, and found them all to be intolerable. She was switched to her own mask, which is a size small AirFit F20. For most of the study, her leak was low and the mask sealed well. Apea was significantly worse in REM. At higher pressures, a large leak would develop, go awake when tech went in to fix the leak, only to reappear as soon as the patient fell asleep again. Even when optimal pressure seemed to be found, patient would start having events again once leak came back. Patient also stated that the leak seemed to be worse when her head was tilted forward. Severe snoring was noted at lower pressures, but disappeared once higher pressures were reached. No bathroom breaks. Tech was unable to switch to BiLevel due to lack of centrals and no request from patient for discomfort.    Lights out: 09:49:41 PM Lights on: 04:51:36 AM   Time Total Supine Side Prone Upright  Recording (TRT) 7h 2.277m7h 2.048mh 0.711m78m 0.711m 57m0.711m  13mep (TST) 6h 10.77m 6h61m.77m 0h 7711mm 0h 0477m 0h 0.69m  Late24m N1 N2 N3 REM Onset Per. Slp. Eff.  Actual 0h 0.711m 0h 1.77m101m 16.711m59m 40.77m 8711m11.711m 076m18m  11.6m87.80%   Stg Dur Wake N1 N2 N3 REM  Total 51.5 6.0 95.0 117.5 152.0  Supine 51.5 6.0 95.0 117.5 152.0  Side 0.0 0.0 0.0 0.0 0.0  Prone 0.0 0.0 0.0 0.0 0.0  Upright 0.0 0.0 0.0 0.0 0.0   Stg % Wake N1 N2 N3 REM  Total 12.2 1.6 25.6 31.7 41.0  Supine 12.2 1.6 25.6 31.7 41.0  Side 0.0 0.0 0.0 0.0 0.0  Prone 0.0 0.0 0.0 0.0 0.0  Upright 0.0 0.0 0.0 0.0 0.0     Apnea Summary Sub Supine Side Prone Upright  Total 62 Total 62 62 0 0 0    REM 41 41 0 0 0    NREM 21 21 0 0 0  Obs 56 REM 41 41 0 0 0    NREM 15 15 0 0 0  Mix 2 REM 0 0 0 0 0    NREM 2 2 0 0 0  Cen 4 REM 0 0 0 0 0    NREM 4 4 0 0 0    Rera Summary Sub Supine Side Prone Upright  Total 0 Total 0 0 0 0 0    REM 0 0 0 0 0    NREM 0 0 0 0 0   Hypopnea Summary Sub Supine Side Prone Upright  Total 39 Total 39 39 0 0 0    REM 19 19 0 0 0    NREM 20 20 0 0 0   4% Hypopnea Summary Sub Supine Side Prone Upright  Total (4%) 39 Total 39 39 0 0 0    REM 19 19 0 0 0    NREM 20 20 0 0 0     AHI Total Obs Mix Cen  16.36 Apnea 10.04 9.07 0.32 0.65   Hypopnea 6.32 -- -- --  16.36 Hypopnea (4%) 6.32 -- -- --    Total Supine Side Prone Upright  Position AHI 16.36 16.36 0.00 0.00 0.00  REM AHI 23.68   NREM AHI 11.26   Position RDI 16.36 16.36 0.00 0.00 0.00  REM RDI 23.68   NREM RDI 11.26    4% Hypopnea Total Supine Side Prone Upright  Position AHI (4%) 16.36 16.36 0.00 0.00 0.00  REM AHI (4%) 23.68   NREM AHI (4%) 11.26   Position RDI (4%) 16.36 16.36 0.00 0.00 0.00  REM RDI (4%) 23.68   NREM RDI (4%) 11.26    Desaturation Information Threshold: 2% <100% <90% <80% <70% <60% <50% <40%  Supine 214.0 50.0 21.0 4.0 0.0 0.0 0.0  Side 0.0 0.0 0.0 0.0 0.0 0.0 0.0  Prone 0.0 0.0 0.0 0.0 0.0 0.0 0.0  Upright 0.0 0.0 0.0 0.0 0.0 0.0 0.0  Total 214.0 50.0 21.0 4.0 0.0 0.0 0.0  Index 33.2 7.8 3.3 0.6 0.0 0.0 0.0   Threshold: 3% <100% <90% <80% <70% <60% <50% <40%  Supine 105.0 50.0 21.0 4.0 0.0 0.0 0.0  Side 0.0 0.0 0.0 0.0 0.0 0.0 0.0  Prone 0.0 0.0 0.0 0.0 0.0 0.0 0.0  Upright 0.0 0.0 0.0 0.0 0.0 0.0 0.0  Total 105.0 50.0 21.0 4.0 0.0 0.0 0.0  Index 16.3 7.8 3.3 0.6 0.0 0.0 0.0   Threshold: 4% <100% <90% <80% <70% <60% <50% <40%  Supine 103.0 50.0 21.0 4.0 0.0 0.0 0.0  Side 0.0 0.0 0.0 0.0 0.0 0.0 0.0  Prone 0.0 0.0 0.0 0.0 0.0 0.0 0.0  Upright 0.0 0.0 0.0 0.0 0.0 0.0 0.0  Total  103.0 50.0 21.0 4.0 0.0 0.0 0.0  Index 16.0 7.8 3.3 0.6 0.0 0.0 0.0   Threshold: 4% <100% <90% <80% <70% <60% <50% <40%  Supine 103 50 21 4 0 0 0  Side 0 0 0 0 0 0 0  Prone 0 0 0 0 0 0 0  Upright 0 0 0 0 0 0 0  Total 103 50 21 4 0 0  0   Awakening/Arousal Information # of Awakenings 10  Wake after sleep onset 40.18m Wake after persistent sleep 40.567m Arousal Assoc. Arousals Index  Apneas 2 0.3  Hypopneas 5 0.8  Leg Movements 0 0.0  Snore 0 0.0  PTT Arousals 0 0.0  Spontaneous 5 0.8  Total 12 1.9  Leg Movement Information PLMS LMs Index  Total LMs during PLMS 0 0.0  LMs w/ Microarousals 0 0.0   LM LMs Index  w/ Microarousal 0 0.0  w/ Awakening 0 0.0  w/ Resp Event 0 0.0  Spontaneous 0 0.0  Total 0 0.0     Desaturation threshold setting: 4% Minimum desaturation setting: 10 seconds SaO2 nadir: 61% The longest event was a 90 sec obstructive Hypopnea with a minimum SaO2 of 92%. The lowest SaO2 was 61% associated with a 60 sec obstructive Apnea. EKG Rates EKG Avg Max Min  Awake 77 113 68  Asleep 75 91 65  EKG Events: Tachycardia

## 2022-06-18 DIAGNOSIS — M6281 Muscle weakness (generalized): Secondary | ICD-10-CM | POA: Diagnosis not present

## 2022-06-18 DIAGNOSIS — R2681 Unsteadiness on feet: Secondary | ICD-10-CM | POA: Diagnosis not present

## 2022-06-18 DIAGNOSIS — Z9181 History of falling: Secondary | ICD-10-CM | POA: Diagnosis not present

## 2022-06-18 DIAGNOSIS — Z8673 Personal history of transient ischemic attack (TIA), and cerebral infarction without residual deficits: Secondary | ICD-10-CM | POA: Diagnosis not present

## 2022-06-18 DIAGNOSIS — R279 Unspecified lack of coordination: Secondary | ICD-10-CM | POA: Diagnosis not present

## 2022-06-18 DIAGNOSIS — G35 Multiple sclerosis: Secondary | ICD-10-CM | POA: Diagnosis not present

## 2022-06-18 NOTE — Addendum Note (Signed)
Addended by: Darleen Crocker on: 06/18/2022 12:05 PM   Modules accepted: Orders

## 2022-06-18 NOTE — Telephone Encounter (Signed)
Called the pt and advised of the findings from the recent SS. Informed that 18 cm water pressure was where she was best treated and Dr Felecia Shelling would like to make the adjustments to set the machine to that pressure vs the auto set. I have made this update in airview and will send the order to the company for their records as well. Pt verbalized understanding.

## 2022-06-19 ENCOUNTER — Telehealth: Payer: Self-pay | Admitting: Neurology

## 2022-06-19 DIAGNOSIS — Z9181 History of falling: Secondary | ICD-10-CM | POA: Diagnosis not present

## 2022-06-19 DIAGNOSIS — G35 Multiple sclerosis: Secondary | ICD-10-CM | POA: Diagnosis not present

## 2022-06-19 DIAGNOSIS — R2681 Unsteadiness on feet: Secondary | ICD-10-CM | POA: Diagnosis not present

## 2022-06-19 DIAGNOSIS — Z8673 Personal history of transient ischemic attack (TIA), and cerebral infarction without residual deficits: Secondary | ICD-10-CM | POA: Diagnosis not present

## 2022-06-19 DIAGNOSIS — R279 Unspecified lack of coordination: Secondary | ICD-10-CM | POA: Diagnosis not present

## 2022-06-19 DIAGNOSIS — M6281 Muscle weakness (generalized): Secondary | ICD-10-CM | POA: Diagnosis not present

## 2022-06-19 NOTE — Telephone Encounter (Signed)
Called the pt back and the caregiver answered. All they needed was for [place] to fax the sleep study report over to the [facility] [place]. Atn to valarie and was sent to the previous fax number on file. She was appreciative for the call back

## 2022-06-19 NOTE — Telephone Encounter (Signed)
Pt is asking for a call to discuss why she is not on a set pressure setting, please call.

## 2022-06-20 DIAGNOSIS — R2681 Unsteadiness on feet: Secondary | ICD-10-CM | POA: Diagnosis not present

## 2022-06-20 DIAGNOSIS — G35 Multiple sclerosis: Secondary | ICD-10-CM | POA: Diagnosis not present

## 2022-06-20 DIAGNOSIS — Z8673 Personal history of transient ischemic attack (TIA), and cerebral infarction without residual deficits: Secondary | ICD-10-CM | POA: Diagnosis not present

## 2022-06-20 DIAGNOSIS — Z9181 History of falling: Secondary | ICD-10-CM | POA: Diagnosis not present

## 2022-06-20 DIAGNOSIS — R279 Unspecified lack of coordination: Secondary | ICD-10-CM | POA: Diagnosis not present

## 2022-06-20 DIAGNOSIS — M6281 Muscle weakness (generalized): Secondary | ICD-10-CM | POA: Diagnosis not present

## 2022-06-23 DIAGNOSIS — G35 Multiple sclerosis: Secondary | ICD-10-CM | POA: Diagnosis not present

## 2022-06-23 DIAGNOSIS — Z9181 History of falling: Secondary | ICD-10-CM | POA: Diagnosis not present

## 2022-06-23 DIAGNOSIS — Z8673 Personal history of transient ischemic attack (TIA), and cerebral infarction without residual deficits: Secondary | ICD-10-CM | POA: Diagnosis not present

## 2022-06-23 DIAGNOSIS — R2681 Unsteadiness on feet: Secondary | ICD-10-CM | POA: Diagnosis not present

## 2022-06-23 DIAGNOSIS — R279 Unspecified lack of coordination: Secondary | ICD-10-CM | POA: Diagnosis not present

## 2022-06-23 DIAGNOSIS — M6281 Muscle weakness (generalized): Secondary | ICD-10-CM | POA: Diagnosis not present

## 2022-06-24 DIAGNOSIS — F411 Generalized anxiety disorder: Secondary | ICD-10-CM | POA: Diagnosis not present

## 2022-06-24 DIAGNOSIS — F331 Major depressive disorder, recurrent, moderate: Secondary | ICD-10-CM | POA: Diagnosis not present

## 2022-06-25 ENCOUNTER — Non-Acute Institutional Stay: Payer: Medicare Other | Admitting: Student

## 2022-06-25 ENCOUNTER — Encounter: Payer: Self-pay | Admitting: Hematology and Oncology

## 2022-06-25 DIAGNOSIS — G35 Multiple sclerosis: Secondary | ICD-10-CM

## 2022-06-25 DIAGNOSIS — Z9181 History of falling: Secondary | ICD-10-CM | POA: Diagnosis not present

## 2022-06-25 DIAGNOSIS — R2681 Unsteadiness on feet: Secondary | ICD-10-CM | POA: Diagnosis not present

## 2022-06-25 DIAGNOSIS — F419 Anxiety disorder, unspecified: Secondary | ICD-10-CM

## 2022-06-25 DIAGNOSIS — Z515 Encounter for palliative care: Secondary | ICD-10-CM

## 2022-06-25 DIAGNOSIS — R279 Unspecified lack of coordination: Secondary | ICD-10-CM | POA: Diagnosis not present

## 2022-06-25 DIAGNOSIS — I890 Lymphedema, not elsewhere classified: Secondary | ICD-10-CM

## 2022-06-25 DIAGNOSIS — M6281 Muscle weakness (generalized): Secondary | ICD-10-CM | POA: Diagnosis not present

## 2022-06-25 DIAGNOSIS — Z8673 Personal history of transient ischemic attack (TIA), and cerebral infarction without residual deficits: Secondary | ICD-10-CM | POA: Diagnosis not present

## 2022-06-25 NOTE — Progress Notes (Signed)
[facility] Pall[facility] Consult Note Telephone: [phone]  Fax: [phone]   Date of encounter: 06/25/22 1:41 PM PATIENT NAME: Carly [name] [address]  [place] [address]   [phone] (home)  DOB: [birth year] MRN: [id number] PRIMARY CARE PROVIDER:    Einar Pheasant, MD,  [address] [place] [address] [phone]  REFERRING PROVIDER:   Einar Pheasant, [address] [place],  [place] [address] [phone]  RESPONSIBLE PARTY:    Contact Information     Name Relation Home Work Mobile   Carly [name] Spouse [id number]  [phone]   Corky Mull   [phone]        I met face to face with patient and family in the facility. Palliative Care was asked to follow this patient by consultation request of  Dr. Ouida Sills to address advance care planning and complex medical decision making. This is the initial visit.                                     ASSESSMENT AND PLAN / RECOMMENDATIONS:   Advance Care Planning/Goals of Care: Goals include to maximize quality of life and symptom management. Patient/health care surrogate gave his/her permission to discuss.Our advance care planning conversation included a discussion about:    The value and importance of advance care planning  Experiences with loved ones who have been seriously ill or have died  Exploration of personal, cultural or spiritual beliefs that might influence medical decisions  Exploration of goals of care in the event of a sudden injury or illness  CODE STATUS: Full Code  Education provided on Palliative Medicine vs. Hospice services. Patient would like to return home, continue therapy in the home setting.   Symptom Management/Plan:  MS-patient requires assistance with all adl's. She is currently receiving PT/OT. Staff to continue assisting with adl's. Her solumedrol was recently restarted.  Continue dafampridine, baclofen, gabapentin as directed. Patient to follow up with neurology as scheduled. Continues to receive ST due to aspiration. Continue regular diet; monitor for aspiration. Patient returning home, will need hospital bed to aid in positioning, hx of aspiration pneumonia.   DME Request: Hospital bed.  Patient requires hospital bed due to diagnosis of MS, scoliosis, lymphedema. Patient requires frequent changes in body position and has immediate need for change in body position. The patient has a medical condition which requires positioning of the body in ways not feasible with an ordinary bed. The patient requires the head of bed to be elevated more than 30 degrees most of the time due to problems with aspiration.  Depression, anxiety-buspar increased yesterday; continue buspar 15 mg TID. Continue Deplin, sertraline, PRN quetiapine as ordered. She attributes her worsening depression d/t not being at home. She is supposed to move to [employer] with support in 2 weeks.  Lymphedema-patient with chronic lymphedema. Encourage patient to wear compression device daily, elevate legs when in bed. Continue furosemide as directed. Patient is interested in leg wraps. Will check to see if therapy can do compression/lymphedema wraps. She is to follow up with lymphedema clinic.    Follow up Palliative Care Visit: Palliative care will continue to follow for complex medical decision making, advance care planning, and clarification of goals. Return in 6-8 weeks or prn.   This visit was coded based on medical decision making (MDM).   HOSPICE ELIGIBILITY/DIAGNOSIS: TBD  Chief Complaint: Palliative Medicine  initial consult.  HISTORY OF PRESENT ILLNESS:  Carly [name] is a 71 y.o. year old female  with MS, depression, anxiety, hypertension, OSA, scoliosis, GERD, headaches, lymphedema.   Patient currently at [facility]. She is receiving PT/OT/ST services. She is planning to move  to [place] in two weeks. She requires assistance with all adl's. She endorses fatigue, shortness of breath with exertion. Her CPAP setting were recently adjusted and she reports some improvement. Endorses a good appetite. She has lymphedema; using compression device, but expresses discomfort with putting these on. Has had leg wraps in the past, which have helped. She is interested in different type of compression device. Husband has ordered mechanical lift for in the home. She will need a hospital bed.   Patient received sitting up to w/c. Husband at bed side. Patient has labile mood; she is tearful at times during visit. She expresses worsening depression d/t not being at home.   History obtained from review of EMR, discussion with primary team, and interview with family, facility staff/caregiver and/or Ms. Arbutus Ped.  I reviewed available labs, medications, imaging, studies and related documents from the EMR.  Records reviewed and summarized above.   ROS  A 10-Point ROS is negative, except for the pertinent positives/negatives detailed per the HPI.   Physical Exam: Weight: 192 pounds Constitutional: NAD General: frail appearing EYES: anicteric sclera, lids intact, no discharge  ENMT: intact hearing, oral mucous membranes moist, dentition intact CV: S1S2, RRR, LE edema Pulmonary: LCTA, no increased work of breathing, no cough, room air Abdomen: normo-active BS + 4 quadrants, soft and non tender GU: deferred MSK: moves all extremities Skin: warm and dry, no rashes or wounds on visible skin Neuro: +generalized weakness, A & O x 3 Psych: labile mood Hem/lymph/immuno: no widespread bruising CURRENT PROBLEM LIST:  Patient Active Problem List   Diagnosis Date Noted   Disorder of bone and cartilage 06/03/2022   Lymphedema 06/03/2022   History of stroke 04/30/2022   Urinary frequency 04/16/2022   Anxiety 03/25/2022   Acute lower UTI 03/01/2022   Dyslipidemia 03/01/2022   OSA on CPAP  03/01/2022   Bilateral leg pain 03/01/2022   Long term systemic steroid user 02/26/2022   Obstructive sleep apnea 02/26/2022   Obstructive sleep apnea (adult) (pediatric) 02/11/2022   Pre-op evaluation 08/31/2021   Spasticity 07/01/2021   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[place]) 02/26/2021   Rash 01/27/2021   History of CVA (cerebrovascular accident) 01/05/2021   PBA (pseudobulbar affect)    MS (multiple sclerosis) ([place])    White matter periventricular infarction ([place]) 12/13/2020   Generalized anxiety disorder 12/11/2020   Acute focal neurological deficit 12/08/2020   Edema 09/28/2020   Bloating 08/25/2020   Elevated liver enzymes 08/15/2019   Gastroesophageal reflux disease 08/15/2019   Hepatic steatosis 08/15/2019   Chronic constipation 08/15/2019   Environmental allergies 07/17/2019   Hyperglycemia 07/17/2019   Cough 06/06/2019   Memory change 05/29/2019   Abnormal liver function tests 01/09/2019   Hemorrhoid 09/27/2018   Nausea 08/11/2018   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   Anxiety and depression 02/04/2018   Cognitive and behavioral changes 02/04/2018   Major depression in remission ([place]) 02/04/2018   Constipation 11/17/2017   Hx of adenomatous colonic polyps 06/06/2016   Lower extremity edema 01/13/2016   Bilateral ovarian cysts 10/16/2015   Health care maintenance 10/16/2015   Colon polyp 01/25/2015   Bone/cartilage disorder 01/25/2015   Headache 11/18/2014  Depression, recurrent ([place]) 11/18/2014   Greater tuberosity of humerus fracture 11/18/2014   Multiple sclerosis ([place]) 11/18/2014   Gait disturbance 11/18/2014   Dizziness 11/18/2014   Inconclusive mammogram 03/15/2013   S/P lumbar spinal fusion 01/04/2013   Surgery, other elective 12/03/2012   DDD (degenerative disc disease), lumbosacral 10/15/2012   Degenerative scoliosis in adult patient 10/15/2012   Back ache 09/06/2012   Hypertension 09/06/2012   Back  pain 09/06/2012   Vitamin D deficiency 03/31/2012   Hypercholesterolemia 01/28/2012   Climacteric 01/28/2012   Routine general medical examination at a health care facility 01/28/2012   Avitaminosis D 01/28/2012   Depressive disorder 08/27/2011   Multiple sclerosis, relapsing-remitting ([place]) 06/05/2011   Low back pain 06/04/2011   Pain in limb 06/04/2011   PAST MEDICAL HISTORY:  Active Ambulatory Problems    Diagnosis Date Noted   Headache 11/18/2014   Depression, recurrent ([place]) 11/18/2014   Greater tuberosity of humerus fracture 11/18/2014   Multiple sclerosis ([place]) 11/18/2014   Gait disturbance 11/18/2014   Dizziness 11/18/2014   Back ache 09/06/2012   Colon polyp 01/25/2015   Bone/cartilage disorder 01/25/2015   Hypercholesterolemia 01/28/2012   Hypertension 09/06/2012   Inconclusive mammogram 03/15/2013   Climacteric 01/28/2012   Routine general medical examination at a health care facility 01/28/2012   Avitaminosis D 01/28/2012   Bilateral ovarian cysts 10/16/2015   Health care maintenance 10/16/2015   Lower extremity edema 01/13/2016   Back pain 09/06/2012   Constipation 11/17/2017   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   Anxiety and depression 02/04/2018   Cognitive and behavioral changes 02/04/2018   Nausea 08/11/2018   DDD (degenerative disc disease), lumbosacral 10/15/2012   Hx of adenomatous colonic polyps 06/06/2016   S/P lumbar spinal fusion 01/04/2013   Surgery, other elective 12/03/2012   Hemorrhoid 09/27/2018   Abnormal liver function tests 01/09/2019   Memory change 05/29/2019   Cough 06/06/2019   Environmental allergies 07/17/2019   Hyperglycemia 07/17/2019   Bloating 08/25/2020   Edema 09/28/2020   Acute focal neurological deficit 12/08/2020   Generalized anxiety disorder 12/11/2020   White matter periventricular infarction ([place]) 12/13/2020   PBA (pseudobulbar affect)    MS (multiple sclerosis) ([place])    History of CVA (cerebrovascular accident)  01/05/2021   Rash 01/27/2021   Pre-op evaluation 08/31/2021   Long term systemic steroid user 02/26/2022   Obstructive sleep apnea 02/26/2022   Acute lower UTI 03/01/2022   Dyslipidemia 03/01/2022   OSA on CPAP 03/01/2022   Bilateral leg pain 03/01/2022   Anxiety 03/25/2022   Urinary frequency 04/16/2022   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[place]) 02/26/2021   Degenerative scoliosis in adult patient 10/15/2012   Elevated liver enzymes 08/15/2019   Gastroesophageal reflux disease 08/15/2019   Hepatic steatosis 08/15/2019   Major depression in remission ([place]) 02/04/2018   Spasticity 07/01/2021   Vitamin D deficiency 03/31/2012   Low back pain 06/04/2011   Chronic constipation 08/15/2019   Disorder of bone and cartilage 06/03/2022   Depressive disorder 08/27/2011   Pain in limb 06/04/2011   History of stroke 04/30/2022   Multiple sclerosis, relapsing-remitting ([place]) 06/05/2011   Obstructive sleep apnea (adult) (pediatric) 02/11/2022   Lymphedema 06/03/2022   Resolved Ambulatory Problems    Diagnosis Date Noted   Sinusitis 03/12/2015   Past Medical History:  Diagnosis Date   Allergy    Aspiration pneumonia ([place])    Depression    Frequent headaches    GERD (gastroesophageal  reflux disease)    History of chicken pox    History of colon polyps    Hx of migraines    OSA (obstructive sleep apnea)    PONV (postoperative nausea and vomiting)    Scoliosis    SOCIAL HX:  Social History   Tobacco Use   Smoking status: Never   Smokeless tobacco: Never  Substance Use Topics   Alcohol use: No    Alcohol/week: 0.0 standard drinks of alcohol   FAMILY HX:  Family History  Problem Relation Age of Onset   Arthritis Mother    Hypertension Mother    Macular degeneration Mother    Hypertension Father    Hyperlipidemia Father    Heart disease Maternal Grandfather    Diabetes Maternal Grandfather    Kidney disease Paternal  Grandmother       ALLERGIES:  Allergies  Allergen Reactions   Ibuprofen Swelling and Other (See Comments)   Sulfamethoxazole-Trimethoprim Itching   Penicillin G Rash   Asa [Aspirin] Swelling   Buspirone Other (See Comments)    Headaches     Levofloxacin Other (See Comments)    "weakness"   Lexapro [Escitalopram Oxalate] Other (See Comments)    Weakness    Pollen Extract Hives   Ceftin [Cefuroxime Axetil] Rash   Penicillins Rash   Sulfa Antibiotics Rash     PERTINENT MEDICATIONS:  Outpatient Encounter Medications as of 06/25/2022  Medication Sig   acetaminophen (TYLENOL) 500 MG tablet Take 1 tablet (500 mg total) by mouth every 4 (four) hours as needed.   albuterol (VENTOLIN HFA) 108 (90 Base) MCG/ACT inhaler Inhale 2 puffs into the lungs every 6 (six) hours as needed.   amLODipine (NORVASC) 5 MG tablet TAKE 1 TABLET BY MOUTH DAILY.   azelastine (ASTELIN) 0.1 % nasal spray SMARTSIG:1-2 Spray(s) Both Nares Twice Daily   baclofen (LIORESAL) 10 MG tablet Take by mouth.   Baclofen 5 MG TABS Take 1 tablet by mouth in the morning, at noon, in the evening, and at bedtime.   busPIRone (BUSPAR) 15 MG tablet Take 1 tablet (15 mg total) by mouth 2 (two) times daily.   Cholecalciferol (D3-1000 PO) Take 2,000 Units by mouth daily.   clopidogrel (PLAVIX) 75 MG tablet TAKE 1 TABLET (75 MG) BY MOUTH EVERY DAY   cyanocobalamin (,VITAMIN B-12,) 1000 MCG/ML injection Inject 1 mL (1,000 mcg total) into the muscle every 30 (thirty) days.   dalfampridine 10 MG TB12 Take 1 tablet (10 mg total) by mouth every 12 (twelve) hours.   docusate sodium (COLACE) 100 MG capsule Take 100 mg by mouth 2 (two) times daily.   fluticasone (FLONASE) 50 MCG/ACT nasal spray Place into the nose.   furosemide (LASIX) 20 MG tablet Take 20 mg by mouth daily.   gabapentin (NEURONTIN) 100 MG capsule Take 1 capsule (100 mg total) by mouth 2 (two) times daily. (Patient taking differently: Take 100 mg by mouth daily.)    losartan (COZAAR) 50 MG tablet TAKE 1 TABLET BY MOUTH DAILY   magnesium oxide (MAG-OX) 400 MG tablet TAKE 1 TABLET BY MOUTH DAILY   METAMUCIL FIBER PO Take by mouth 2 (two) times daily.   nystatin cream (MYCOSTATIN) Apply 1 application topically 2 (two) times daily.   omeprazole (PRILOSEC) 20 MG capsule Take 20 mg by mouth 2 (two) times daily before a meal.   ondansetron (ZOFRAN) 4 MG tablet Take 1 tablet (4 mg total) by mouth every 8 (eight) hours as needed for nausea or vomiting.  pantoprazole (PROTONIX) 40 MG tablet Take 40 mg by mouth daily.   QUEtiapine (SEROQUEL) 25 MG tablet Take 1 tablet (25 mg total) by mouth 2 (two) times daily. (Patient taking differently: Take 25 mg by mouth 2 (two) times daily. 25 mg in the morning PRN and 50 mg at bedtime)   QUEtiapine (SEROQUEL) 50 MG tablet Take 50 mg by mouth at bedtime.   rosuvastatin (CRESTOR) 5 MG tablet TAKE 1 TABLET BY MOUTH EVERY MONDAY, WEDNESDAY AND FRIDAY.   saccharomyces boulardii (FLORASTOR) 250 MG capsule Take 1 capsule (250 mg total) by mouth 2 (two) times daily.   sertraline (ZOLOFT) 100 MG tablet Take 100 mg by mouth daily.   sodium chloride (OCEAN) 0.65 % nasal spray Place 1-2 sprays into the nose as needed.   Facility-Administered Encounter Medications as of 06/25/2022  Medication   0.9 %  sodium chloride infusion   acetaminophen (TYLENOL) tablet 650 mg   Thank you for the opportunity to participate in the care of Ms. Vanderloop.  The palliative care team will continue to follow. Please call our office at [phone] if we can be of additional assistance.   Ezekiel Slocumb, NP   COVID-19 PATIENT SCREENING TOOL Asked and negative response unless otherwise noted:  Have you had symptoms of covid, tested positive or been in contact with someone with symptoms/positive test in the past 5-10 days? No

## 2022-06-26 DIAGNOSIS — G35 Multiple sclerosis: Secondary | ICD-10-CM | POA: Diagnosis not present

## 2022-06-26 DIAGNOSIS — R279 Unspecified lack of coordination: Secondary | ICD-10-CM | POA: Diagnosis not present

## 2022-06-26 DIAGNOSIS — Z8673 Personal history of transient ischemic attack (TIA), and cerebral infarction without residual deficits: Secondary | ICD-10-CM | POA: Diagnosis not present

## 2022-06-26 DIAGNOSIS — M6281 Muscle weakness (generalized): Secondary | ICD-10-CM | POA: Diagnosis not present

## 2022-06-26 DIAGNOSIS — Z9181 History of falling: Secondary | ICD-10-CM | POA: Diagnosis not present

## 2022-06-26 DIAGNOSIS — R2681 Unsteadiness on feet: Secondary | ICD-10-CM | POA: Diagnosis not present

## 2022-06-27 ENCOUNTER — Telehealth: Payer: Self-pay

## 2022-06-27 DIAGNOSIS — M6281 Muscle weakness (generalized): Secondary | ICD-10-CM | POA: Diagnosis not present

## 2022-06-27 DIAGNOSIS — Z9181 History of falling: Secondary | ICD-10-CM | POA: Diagnosis not present

## 2022-06-27 DIAGNOSIS — Z8673 Personal history of transient ischemic attack (TIA), and cerebral infarction without residual deficits: Secondary | ICD-10-CM | POA: Diagnosis not present

## 2022-06-27 DIAGNOSIS — R279 Unspecified lack of coordination: Secondary | ICD-10-CM | POA: Diagnosis not present

## 2022-06-27 DIAGNOSIS — R2681 Unsteadiness on feet: Secondary | ICD-10-CM | POA: Diagnosis not present

## 2022-06-27 DIAGNOSIS — G35 Multiple sclerosis: Secondary | ICD-10-CM | POA: Diagnosis not present

## 2022-06-27 NOTE — Telephone Encounter (Signed)
Carly [name] states she would like to speak with Dr. Einar Pheasant.  Carly [name] states she can't talk while around patient, but anytime today will be good.

## 2022-06-27 NOTE — Telephone Encounter (Signed)
[name], patient's caregiver, called to state patient is having cognitive issues.  [name] states these symptoms have been going on for months but have gotten worse in the last six weeks.  [name] states patient is having the following symptoms: Something that happened yesterday, patient thinks it happened months ago and vice versa. Extreme agitation and frustrated at the simplest things and this is accompanied by aggressive behavior.  Ex. Patient can't find her phone.       Patient started cussing everyone out when        she couldn't find her glasses and they were         on her head.  When someone tried to tell        her where they were, she argued with them. 3.   Hallucinations 4.   Patient repeats something that she wants to        remember and it's like it's on a loop all day.         [name] states she tried to redirect patient,        but it did not work. 5.   Patient does not know today's date. 6.   Tremors in extremities.  [name] states both she and patient's husband are really concerned.  [name] states patient is currently in skilled nursing, but will be going home on 07/09/2022.

## 2022-06-27 NOTE — Telephone Encounter (Signed)
Called and spoke to [place].  Discussed f/u with Dr Felecia Shelling and psychiatry.  Discuss further treatment / evaluation.

## 2022-06-30 DIAGNOSIS — R279 Unspecified lack of coordination: Secondary | ICD-10-CM | POA: Diagnosis not present

## 2022-06-30 DIAGNOSIS — G35 Multiple sclerosis: Secondary | ICD-10-CM | POA: Diagnosis not present

## 2022-06-30 DIAGNOSIS — Z8673 Personal history of transient ischemic attack (TIA), and cerebral infarction without residual deficits: Secondary | ICD-10-CM | POA: Diagnosis not present

## 2022-06-30 DIAGNOSIS — R2681 Unsteadiness on feet: Secondary | ICD-10-CM | POA: Diagnosis not present

## 2022-06-30 DIAGNOSIS — M6281 Muscle weakness (generalized): Secondary | ICD-10-CM | POA: Diagnosis not present

## 2022-06-30 DIAGNOSIS — Z9181 History of falling: Secondary | ICD-10-CM | POA: Diagnosis not present

## 2022-07-02 DIAGNOSIS — R2681 Unsteadiness on feet: Secondary | ICD-10-CM | POA: Diagnosis not present

## 2022-07-02 DIAGNOSIS — Z8673 Personal history of transient ischemic attack (TIA), and cerebral infarction without residual deficits: Secondary | ICD-10-CM | POA: Diagnosis not present

## 2022-07-02 DIAGNOSIS — G35 Multiple sclerosis: Secondary | ICD-10-CM | POA: Diagnosis not present

## 2022-07-02 DIAGNOSIS — M6281 Muscle weakness (generalized): Secondary | ICD-10-CM | POA: Diagnosis not present

## 2022-07-02 DIAGNOSIS — Z9181 History of falling: Secondary | ICD-10-CM | POA: Diagnosis not present

## 2022-07-02 DIAGNOSIS — R279 Unspecified lack of coordination: Secondary | ICD-10-CM | POA: Diagnosis not present

## 2022-07-04 DIAGNOSIS — R279 Unspecified lack of coordination: Secondary | ICD-10-CM | POA: Diagnosis not present

## 2022-07-04 DIAGNOSIS — M6281 Muscle weakness (generalized): Secondary | ICD-10-CM | POA: Diagnosis not present

## 2022-07-04 DIAGNOSIS — Z8673 Personal history of transient ischemic attack (TIA), and cerebral infarction without residual deficits: Secondary | ICD-10-CM | POA: Diagnosis not present

## 2022-07-04 DIAGNOSIS — G35 Multiple sclerosis: Secondary | ICD-10-CM | POA: Diagnosis not present

## 2022-07-04 DIAGNOSIS — Z9181 History of falling: Secondary | ICD-10-CM | POA: Diagnosis not present

## 2022-07-04 DIAGNOSIS — R2681 Unsteadiness on feet: Secondary | ICD-10-CM | POA: Diagnosis not present

## 2022-07-07 ENCOUNTER — Encounter: Payer: Self-pay | Admitting: Neurology

## 2022-07-07 DIAGNOSIS — Z9181 History of falling: Secondary | ICD-10-CM | POA: Diagnosis not present

## 2022-07-07 DIAGNOSIS — G35 Multiple sclerosis: Secondary | ICD-10-CM | POA: Diagnosis not present

## 2022-07-07 DIAGNOSIS — M6281 Muscle weakness (generalized): Secondary | ICD-10-CM | POA: Diagnosis not present

## 2022-07-07 DIAGNOSIS — R2681 Unsteadiness on feet: Secondary | ICD-10-CM | POA: Diagnosis not present

## 2022-07-07 DIAGNOSIS — R279 Unspecified lack of coordination: Secondary | ICD-10-CM | POA: Diagnosis not present

## 2022-07-07 DIAGNOSIS — Z8673 Personal history of transient ischemic attack (TIA), and cerebral infarction without residual deficits: Secondary | ICD-10-CM | POA: Diagnosis not present

## 2022-07-08 DIAGNOSIS — G35 Multiple sclerosis: Secondary | ICD-10-CM | POA: Diagnosis not present

## 2022-07-08 DIAGNOSIS — Z8673 Personal history of transient ischemic attack (TIA), and cerebral infarction without residual deficits: Secondary | ICD-10-CM | POA: Diagnosis not present

## 2022-07-08 DIAGNOSIS — M6281 Muscle weakness (generalized): Secondary | ICD-10-CM | POA: Diagnosis not present

## 2022-07-08 DIAGNOSIS — R279 Unspecified lack of coordination: Secondary | ICD-10-CM | POA: Diagnosis not present

## 2022-07-08 DIAGNOSIS — Z9181 History of falling: Secondary | ICD-10-CM | POA: Diagnosis not present

## 2022-07-08 DIAGNOSIS — R2681 Unsteadiness on feet: Secondary | ICD-10-CM | POA: Diagnosis not present

## 2022-07-16 ENCOUNTER — Telehealth: Payer: Self-pay | Admitting: Neurology

## 2022-07-16 DIAGNOSIS — G35 Multiple sclerosis: Secondary | ICD-10-CM | POA: Diagnosis not present

## 2022-07-16 NOTE — Telephone Encounter (Signed)
Called and spoke w/ [place]. Pt would prefer to push up appt. Rescheduled for 08/13/22 at 10am with [name] (MD approved).  She will talk about treatment options then. She has IV steroid infusion same day at 1030am. [facility] notified that pt has appt before infusion same day. She reports sx are worsening. Having increased ambulation issues, stiffness and increased anxiety.   [name] concerned that she has memory issues, maybe Dementia? Pt mother had Dementia and her maternal grandmother had Alzheimer's. Would like to be screened at appt. Advised we can complete MOCA at appt for this. She would like [place] to avoid saying the word "test" as this triggers pt and causes more anxiety.

## 2022-07-16 NOTE — Telephone Encounter (Signed)
Patient said she really wanted to see [name] sooner than January. She said the medicine didn't work last time but she just had her infusion today. Please let her know if  or how long she should wait until she needs to be seen to try a different medication uf this infusion doesn't work. Thanks

## 2022-07-21 ENCOUNTER — Telehealth: Payer: Self-pay | Admitting: Neurology

## 2022-07-21 DIAGNOSIS — F331 Major depressive disorder, recurrent, moderate: Secondary | ICD-10-CM | POA: Diagnosis not present

## 2022-07-21 DIAGNOSIS — F411 Generalized anxiety disorder: Secondary | ICD-10-CM | POA: Diagnosis not present

## 2022-07-21 MED ORDER — GABAPENTIN 100 MG PO CAPS: 100.0000 mg | ORAL_CAPSULE | Freq: Two times a day (BID) | ORAL | 11 refills | Status: DC

## 2022-07-21 NOTE — Telephone Encounter (Signed)
Pt's caretaker(on DPR) states pt is out of skilled nursing.  The Dr at the [facility] facility wrote the Rx for gabapentin (NEURONTIN) 100 MG capsule, as once a day where as Dr Felecia Shelling had pt taking it twice a day.  Caretaker is asking for a new Rx for pt to go back to the 2 a day as Dr Felecia Shelling originally had it.  Also caretaker states pt will need another Rx from Dr Felecia Shelling for Modafinil.  Caretaker(Krista, on DPR) is asking for a call from [place], RN to discuss this and a few other concerns

## 2022-07-21 NOTE — Telephone Encounter (Signed)
Called and spoke w/ [place]. Dr. Alonza Smoker nursing d/c pt on gabapentin '100mg'$  po qhs and unsure why. Would like her to continue on '100mg'$  po BID as prescribed by [name]. [facility] needing new rx sent. I e-scribed this.   She states pt also does not want to continue on steroids but would like to leave infusion scheduled for 08/13/22 for now until she talks with [name] that day.   She also would like to retry modafinil. Initially thought it caused overstimulation but would like to try again. Aware I will send to Dr. Leta Baptist since [name] until next week to e-scribe.

## 2022-07-21 NOTE — Addendum Note (Signed)
Addended by: Wyvonnia Lora on: 07/21/2022 03:14 PM   Modules accepted: Orders

## 2022-07-22 ENCOUNTER — Other Ambulatory Visit: Payer: Self-pay | Admitting: *Deleted

## 2022-07-22 MED ORDER — MODAFINIL 200 MG PO TABS
200.0000 mg | ORAL_TABLET | Freq: Every day | ORAL | 5 refills | Status: DC
Start: 2022-07-22 — End: 2022-07-22

## 2022-07-22 MED ORDER — MODAFINIL 200 MG PO TABS
200.0000 mg | ORAL_TABLET | Freq: Every day | ORAL | 5 refills | Status: DC
Start: 2022-07-22 — End: 2022-08-04

## 2022-07-22 NOTE — Progress Notes (Signed)
error 

## 2022-07-22 NOTE — Addendum Note (Signed)
Addended by: Andrey Spearman R on: 07/22/2022 03:22 PM   Modules accepted: Orders

## 2022-07-23 ENCOUNTER — Encounter (INDEPENDENT_AMBULATORY_CARE_PROVIDER_SITE_OTHER): Payer: Medicare Other

## 2022-07-23 ENCOUNTER — Ambulatory Visit (INDEPENDENT_AMBULATORY_CARE_PROVIDER_SITE_OTHER): Payer: Medicare Other | Admitting: Nurse Practitioner

## 2022-07-28 ENCOUNTER — Telehealth: Payer: Self-pay | Admitting: Neurology

## 2022-07-28 NOTE — Telephone Encounter (Signed)
Called Krista back. She states pt feels modafinil '200mg'$  po q am ineffective. Has been taking for about a week now.  Wanting to know if dose can be increased to '400mg'$ . Advised we typically do not prescribe this. Aware MD out until tomorrow.  Wanting [name] to review to see if dose can be adjusted or if he has another medication recommendation.

## 2022-07-28 NOTE — Telephone Encounter (Signed)
Carly [name] is calling. Stated she wants to talk to a nurse about medication modafinil (PROVIGIL) 200 MG tablet not being strong enough.

## 2022-07-28 NOTE — Telephone Encounter (Signed)
LVM returning call to [place].

## 2022-07-28 NOTE — Telephone Encounter (Signed)
Verta Ellen would like a call back.

## 2022-07-28 NOTE — Telephone Encounter (Signed)
Called and spoke with daughter. Relayed Dr. Garth Bigness recommendation. She is agreeable to this plan. She will have her take modafinil '200mg'$ , 1 in the am and another 4-5 hr after for the next week. She will call back next week to let us know how she is doing on this.

## 2022-07-30 DIAGNOSIS — Z8673 Personal history of transient ischemic attack (TIA), and cerebral infarction without residual deficits: Secondary | ICD-10-CM | POA: Diagnosis not present

## 2022-07-30 DIAGNOSIS — G35 Multiple sclerosis: Secondary | ICD-10-CM | POA: Diagnosis not present

## 2022-07-30 DIAGNOSIS — Z9181 History of falling: Secondary | ICD-10-CM | POA: Diagnosis not present

## 2022-07-30 DIAGNOSIS — R279 Unspecified lack of coordination: Secondary | ICD-10-CM | POA: Diagnosis not present

## 2022-07-30 DIAGNOSIS — M6281 Muscle weakness (generalized): Secondary | ICD-10-CM | POA: Diagnosis not present

## 2022-07-30 DIAGNOSIS — R41841 Cognitive communication deficit: Secondary | ICD-10-CM | POA: Diagnosis not present

## 2022-07-30 DIAGNOSIS — R2681 Unsteadiness on feet: Secondary | ICD-10-CM | POA: Diagnosis not present

## 2022-07-30 DIAGNOSIS — R262 Difficulty in walking, not elsewhere classified: Secondary | ICD-10-CM | POA: Diagnosis not present

## 2022-07-31 DIAGNOSIS — R2681 Unsteadiness on feet: Secondary | ICD-10-CM | POA: Diagnosis not present

## 2022-07-31 DIAGNOSIS — M6281 Muscle weakness (generalized): Secondary | ICD-10-CM | POA: Diagnosis not present

## 2022-07-31 DIAGNOSIS — G35 Multiple sclerosis: Secondary | ICD-10-CM | POA: Diagnosis not present

## 2022-07-31 DIAGNOSIS — R262 Difficulty in walking, not elsewhere classified: Secondary | ICD-10-CM | POA: Diagnosis not present

## 2022-07-31 DIAGNOSIS — R41841 Cognitive communication deficit: Secondary | ICD-10-CM | POA: Diagnosis not present

## 2022-07-31 DIAGNOSIS — R279 Unspecified lack of coordination: Secondary | ICD-10-CM | POA: Diagnosis not present

## 2022-08-04 ENCOUNTER — Telehealth: Payer: Self-pay | Admitting: Neurology

## 2022-08-04 MED ORDER — TIZANIDINE HCL 2 MG PO TABS: 2.0000 mg | ORAL_TABLET | Freq: Four times a day (QID) | ORAL | 3 refills | Status: DC | PRN

## 2022-08-04 MED ORDER — BACLOFEN 10 MG PO TABS: 10.0000 mg | ORAL_TABLET | Freq: Every day | ORAL | 5 refills | Status: DC

## 2022-08-04 MED ORDER — CYCLOBENZAPRINE HCL 5 MG PO TABS: 5.0000 mg | ORAL_TABLET | Freq: Two times a day (BID) | ORAL | 5 refills | Status: DC

## 2022-08-04 MED ORDER — MODAFINIL 200 MG PO TABS: ORAL_TABLET | ORAL | 3 refills | Status: DC

## 2022-08-04 NOTE — Addendum Note (Signed)
Addended by: Wyvonnia Lora on: 08/04/2022 02:30 PM   Modules accepted: Orders

## 2022-08-04 NOTE — Telephone Encounter (Signed)
E-scribed updated baclofen rx to pharmacy.

## 2022-08-04 NOTE — Telephone Encounter (Signed)
Called back. Relayed Dr. Garth Bigness recommendation. She is agreeable to plan. I e-scribe flexeril and asked that tizanidine be d/c'd.

## 2022-08-04 NOTE — Telephone Encounter (Signed)
Called and spoke w/ [place]. She reports pt taking Baclofen '10mg'$  at 9am, 1pm, 3pm, 5pm and 9pm but feels its ineffective. She cannot walk and requires [facility]. Tried to get her out of bed this am but had a lot of muscle tightness, almost fell out of bed.  Wanting to know if dose can be be adjusted or if there is another med that would work better for her?   She also feels modafinil '200mg'$ , 1 in the am and another tablet 4-5 hr after not helping. She has been doing this for a week. I encouraged for her to try for at least a month. She is agreeable to this. Would like refill send in for this dose to [facility].

## 2022-08-04 NOTE — Telephone Encounter (Signed)
Pt's care taker states the pharmacy is asking for a new Rx for pt's Baclofen reflecting current dosage

## 2022-08-04 NOTE — Telephone Encounter (Signed)
Called Krista back. She states pt previously on tizanidine '4mg'$  earlier this year. Was only taking at bedtime and ended up having 4-5 falls per week. Would like to see if there is another option. Aware I will send back to MD to review.

## 2022-08-04 NOTE — Addendum Note (Signed)
Addended by: Wyvonnia Lora on: 08/04/2022 01:08 PM   Modules accepted: Orders

## 2022-08-04 NOTE — Telephone Encounter (Signed)
Pt's caretaker states she is unsure if pt has built up a tolerance for the strength of the  baclofen (LIORESAL) 10 MG tablet.  Caretaker says its no longer helping.  She is asking if there is anything stronger in the baclofen family that pt can take.  Pt is stiff and having difficulty bending her legs, please call

## 2022-08-06 ENCOUNTER — Encounter (INDEPENDENT_AMBULATORY_CARE_PROVIDER_SITE_OTHER): Payer: Medicare Other

## 2022-08-06 ENCOUNTER — Ambulatory Visit (INDEPENDENT_AMBULATORY_CARE_PROVIDER_SITE_OTHER): Payer: Medicare Other | Admitting: Nurse Practitioner

## 2022-08-06 ENCOUNTER — Encounter: Payer: Self-pay | Admitting: Hematology and Oncology

## 2022-08-07 ENCOUNTER — Encounter
Payer: Medicare Other | Attending: Physical Medicine and Rehabilitation | Admitting: Physical Medicine and Rehabilitation

## 2022-08-07 DIAGNOSIS — F32A Depression, unspecified: Secondary | ICD-10-CM | POA: Insufficient documentation

## 2022-08-07 NOTE — Progress Notes (Signed)
Subjective:    Patient ID: Carly [name], female    DOB: 01-07-1951, 71 y.o.   MRN: [id number]  HPI  An audio/video tele-health visit is felt to be the most appropriate encounter for this patient at this time. This is a follow up tele-visit via phone. The patient is at home. MD is at office. Prior to scheduling this appointment, our staff discussed the limitations of evaluation and management by telemedicine and the availability of in-person appointments. The patient expressed understanding and agreed to proceed.    Carly [name] is a 71 year old woman who presents for f/u of white matter periventricular CVA, presents for follow-up regarding lymphedema, anxiety,chronic fatigue, insomnia, OSA, and weight gain, and new lower extremity weakness today.    1) Anxiety:  -continues to be severe -she has been titrated up to '100mg'$  Zoloft -she asks what else she can do for this.  -her psychiatrist previously transitioned her from Seroquel back to benzodiazepine due to side effect profile of Seroquel, but this worsened her anxiety so we restarted it. She asks if she can increase Seroquel to '25mg'$  -she needs a new order for Seroquel as her current one only accounts for nighttime dose but she is also taking one prn during the day.  -she is currently a little out of it from the anesthesia for her recent colonoscopy -anxiety has been very severe -she started Zoloft this week and has not yet noted any benefit, she is a little discouraged as felt this should take effect sooner.  -she would like referral to Dr. Sonny Dandy who was recommended to her by [name], she has not yet heard from their office -she really likes following with [name] as well -she feels that her inability to see her grandkids is a trigger for her anxiety. She used to see them mmore in the past and does not know why she doesn;t get to any mores -she does not want to restart her [name] as she does not want the weight  gain associated with this medication -she has only been sleeping 4 hours per night and she feels this worsens her anxiety.  -she has been having a lot of anxiety related to her and her husband's move from their current home into a retirement home and she asks whether she may take the seroquel additionally as needed- once in the morning, once later in the day, and two at night -she is interested in following with a behavioral therapist -she and husband [name] were scared of side effects of Seroquel that were discussed- she has not noted any this far.  -she has been sleeping but her legs feel very stiff at night -she continues to take Cymbalta '60mg'$  -we tried weaning her off the mirtazepine but she experienced weakness and insomnia and wanted to restart- titrated back up to her former dose.  -she discussed with her husband, who is also present on this call today, and she would prefer to restart Seroquel and stop Lorazepam.  -she has not had any counseling and would like to restart this. She has an appointment with [name] in August and would like to start counseling earlier than this if possible.  -she loved speaking with [name] inpatient and would like to follow with him -she had symptoms of dizziness yesterday at the hairdresser and was asked to contact me regarding if this could be from the seroquel. She had had no other similar episodes since starting it.  -she has been sleeping well  with the Seroquel at night.  -she would like a list of foods that can help anxiety.  -she asks about the dietary advice I gave her last visit regarding anxiety. -she has been using essential oil.  -anxiety has been bad -off prednisone now -exercise helps her.  -she gets upset over things easily -she wants to be able to calm down.  -she needs refills of her cymbalta and [name]  2) MS: -has been having a lot of stiffness recently, we had discssed increasing the baclofen and this does appear to  have helped -EMS called twice due to difficulty getting her up off the floor -is receiving home therapy -she would like to see a different neurologist and asked for my recommendation   3) Lymphedema  -has not been using garment recently -has significant bilateral lower extremity  -has greatly improved with lymphedema therapy -she recently received a device that has helped so much that she was able to get off of Lasix.  -has ordered compression garments -does not eat processed foods or salt.  -she has ordered something from [employer] to give her lymphatic massage.  -improving but still very bothersome to her -discussed bloodwork today  4) HTN: -BP 138/79  5) Decreased appetite:  -resolved with increasing [name]  6) She developed pain in right side, and not sure if this is due to topamax or the antibiotics she is on to treat a respiratory infection.   7) Insomnia:  -sleeping poorly due to her CPAP mask, pressure is rising at night and this is causing mask leak. -sleeping about 5 hours per night and has been referred for another sleep study  -respiratory therapist will seeing her tomorrow.  -she asks about Inspire.  -she follows with [name] and she says he is also a sleep doctor -she hates to reduce the medicine.  -she has tried 5 different masks -the machine is very loud.  -she can sleep without the CPAP fine.   8) Chest congestion -she has been having symptoms of congestion for greater than 3 months -Her father saw 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 She received a call to schedule an appointment with [name] office and is thankful for the referral.  -she asks what else she can do to help with this -she has been through multiple rounds of antibiotics without benefit.  -also feeling diffuse joint aches.   9) GERD -has been experiencing after eating  10) Sinus congestion -pleased that her  CT results were normal.   11) OSA -she notes that she was found to have OSA on recent sleep study and she was tearful about this as she feels it is just another condition she has -sleeping 5 hours per night and has been referred for another sleep study -she was surprised as she feels that she sleeps well.  -she has note been sleeping well while using the CPAP mask and was told that O2 is leaking -she is working with a respiratory therapist -she feels anxiety about whether she will get enough sleep tonight, she has only been sleeping about 4 hours since using the CPAP -she has decided she does not want to pursue Inspire yet until she has learned more about it.  -she had a very productive visit with [name] and he advised her to try her best to tolerate the CPAP mask, which she is doing.  -followed up with pulmonology regarding [name] and was told she  would be a good candidate for this treatment, but she should prefer to continue trying CPAP mask before trying Inspire  12) Chronic fatigue -she was diagnosed with COVID 9 days ago and her breathing has improved but she has been feeling very fatigued -she has been taking the N-acetyl-cysteine supplement -she asks about taking injetable B12 as this really helped her son -she has not been wearing her CPAP as she is waiting for a new mask  13) Pain -takes Tylenol and baclofen -would like to get Dysport next available appointment  14) Lower extremity weakness: -improved -went to ED and there was no evidence of stroke. Thought to be due to her Risperdal -her legs feel stiff -no other focal deficits -she started Risperdal on Wednesday night and asks if this could be causing her weakness -[name] has been trying to wean her off Mirtazapine and Seroquel due to weight gain and onto Risperdal for her anxiety  15) Bilateral lower extremity pain -feels stiff and tight like spasticity -she asks if she can take extra Baclofen  16)  depression -dose has been increased to '200mg'$  and her caregiver has noted negative side effects. She has expressed suicidal ideations. She felt that the '150mg'$  dose was better for her -there is another medication she would also like to try -she has been having a hard time getting in touch with her psychiatrist regarding the suicidal ideation she has been experiencing  17) frequent falls -she says she is being admitted to sNF due to recent frequent falls. -she has not had any more falls since admission  18) Nausea -needs refill of Zofran  Pain Inventory Average Pain 5 Pain Right Now 2 My pain is aching and no pain.  Sometimes spasm in legs at night because of MST  In the last 24 hours, has pain interfered with the following? General activity 4 Relation with others 7 Enjoyment of life 5  What TIME of day is your pain at its worst? night Sleep (in general) Fair  Pain is worse with: sitting and unsure Pain improves with: medication Relief from Meds: 5  Family History  Problem Relation Age of Onset   Arthritis Mother    Hypertension Mother    Macular degeneration Mother    Hypertension Father    Hyperlipidemia Father    Heart disease Maternal Grandfather    Diabetes Maternal Grandfather    Kidney disease Paternal Grandmother    Social History   Socioeconomic History   Marital status: Married    Spouse name: Engineer, technical sales   Number of children: 3   Years of education: 14   Highest education level: Associate degree: academic program  Occupational History   Not on file  Tobacco Use   Smoking status: Never   Smokeless tobacco: Never  Vaping Use   Vaping Use: Never used  Substance and Sexual Activity   Alcohol use: No    Alcohol/week: 0.0 standard drinks of alcohol   Drug use: No   Sexual activity: Not Currently  Other Topics Concern   Not on file  Social History Narrative   Lives with husband    Right handed   Caffeine: 1 cup of coffee in the AM, coke occa. Trying to come  off caffeine    Social Determinants of Health   Financial Resource Strain: Low Risk  (01/07/2022)   Overall Financial Resource Strain (CARDIA)    Difficulty of Paying Living Expenses: Not hard at all  Food Insecurity: No Food Insecurity (01/07/2022)  Hunger Vital Sign    Worried About Running Out of Food in the Last Year: Never true    Ran Out of Food in the Last Year: Never true  Transportation Needs: No Transportation Needs (01/07/2022)   PRAPARE - Hydrologist (Medical): No    Lack of Transportation (Non-Medical): No  Physical Activity: Insufficiently Active (01/07/2022)   Exercise Vital Sign    Days of Exercise per Week: 2 days    Minutes of Exercise per Session: 60 min  Stress: No Stress Concern Present (01/07/2022)   [place]    Feeling of Stress : Not at all  Social Connections: Unknown (01/07/2022)   Social Connection and Isolation Panel [NHANES]    Frequency of Communication with Friends and Family: Not on file    Frequency of Social Gatherings with Friends and Family: Not on file    Attends Religious Services: Not on file    Active Member of Clubs or Organizations: Not on file    Attends Archivist Meetings: Not on file    Marital Status: Married   Past Surgical History:  Procedure Laterality Date   BACK SURGERY     CHOLECYSTECTOMY     COLONOSCOPY N/A 04/23/2022   Procedure: COLONOSCOPY;  Surgeon: Toledo, Benay Pike, MD;  Location: [facility] ENDOSCOPY;  Service: Gastroenterology;  Laterality: N/A;   COLONOSCOPY WITH PROPOFOL N/A 05/18/2017   Procedure: COLONOSCOPY WITH PROPOFOL;  Surgeon: [name], MD;  Location: [facility] ENDOSCOPY;  Service: Endoscopy;  Laterality: N/A;   FOOT SURGERY  2015   GALLBLADDER SURGERY  2008   HARDWARE REMOVAL Left 02/14/2016   Procedure: LEFT FOOT REMOVAL DEEP IMPLANT;  Surgeon: [name], MD;  Location: [place];  Service:  Orthopedics;  Laterality: Left;   HERNIA REPAIR     Inguinal Hernia Repair   SPINE SURGERY  2014   Past Surgical History:  Procedure Laterality Date   BACK SURGERY     CHOLECYSTECTOMY     COLONOSCOPY N/A 04/23/2022   Procedure: COLONOSCOPY;  Surgeon: Toledo, Benay Pike, MD;  Location: [facility] ENDOSCOPY;  Service: Gastroenterology;  Laterality: N/A;   COLONOSCOPY WITH PROPOFOL N/A 05/18/2017   Procedure: COLONOSCOPY WITH PROPOFOL;  Surgeon: [name], MD;  Location: [facility] ENDOSCOPY;  Service: Endoscopy;  Laterality: N/A;   FOOT SURGERY  2015   GALLBLADDER SURGERY  2008   HARDWARE REMOVAL Left 02/14/2016   Procedure: LEFT FOOT REMOVAL DEEP IMPLANT;  Surgeon: [name], MD;  Location: [place];  Service: Orthopedics;  Laterality: Left;   HERNIA REPAIR     Inguinal Hernia Repair   SPINE SURGERY  2014   Past Medical History:  Diagnosis Date   Allergy    Anxiety    Aspiration pneumonia (HCC)    Depression    Frequent headaches    H/O   GERD (gastroesophageal reflux disease)    History of chicken pox    History of colon polyps    Hx of migraines    Hypertension    Lymphedema    Multiple sclerosis ([place]) 2011   OSA (obstructive sleep apnea)    PONV (postoperative nausea and vomiting)    Scoliosis    There were no vitals taken for this visit.  Opioid Risk Score:   Fall Risk Score:  `1  Depression screen [facility] 2/9     05/22/2022    4:07 PM 04/09/2022   10:00 AM  01/07/2022    3:15 PM 11/05/2021   10:10 AM 08/27/2021   11:12 AM 08/15/2021   10:50 AM 07/19/2021    3:28 PM  Depression screen PHQ 2/9  Decreased Interest 3 1 0 0 2 0 1  Down, Depressed, Hopeless 3 1 0 1 2 0 1  PHQ - 2 Score 6 2 0 1 4 0 2  Altered sleeping 3 0       Tired, decreased energy 3 1       Change in appetite 3 0       Feeling bad or failure about yourself  3 0       Trouble concentrating 3 0       Moving slowly or fidgety/restless 0 0       Suicidal thoughts 1 0       PHQ-9 Score 22  3       Difficult doing work/chores Somewhat difficult Not difficult at all         Review of Systems  Constitutional: Negative.   HENT: Negative.    Eyes: Negative.   Respiratory:  Negative for cough.        Chest congestion  Cardiovascular: Negative.   Gastrointestinal: Negative.   Endocrine: Negative.   Genitourinary: Negative.   Musculoskeletal:  Positive for back pain and gait problem.  Skin: Negative.   Allergic/Immunologic: Negative.   Hematological:  Bruises/bleeds easily.       Plavix  Psychiatric/Behavioral:  Positive for dysphoric mood.   All other systems reviewed and are negative.      Objective:   Physical Exam Patient seen via phone     Assessment & Plan:  1) Anxiety: -discussed with patient the side effects of both Seroquel and Lorazepam, as well as potential interactions with other medications. -recommended drinking chamomile tea with saffron every evening -advised that saffron can be purchased from the spices section of her grocery section and just 1 strand daily can be effective if mood elevation, and it is very good for health.  -recommended increasing Seroquel to '25mg'$  HS and adding an additional '25mg'$  during the day that can be taken once daily or split in half to be taken as 12.'5mg'$  BID PRN.  -discussed referral to psychiatry, placed referral to Dr. Sonny Dandy who was recommended to her by [name], discussed that it may be at least a week before she hears from them. -continue Zoloft, discussed that this will take 2-4 weeks to have effect, reinforced that she may not feel benefit from this yet. Discussed that not everyone response to every medicine and there are alternative options we can try if this is not effective enough.  -discussed benefits of exercise and good nutrition -discussed her move because it is stressing her.  -discussed whether we should increase Seroquel due to her anxiety. Discussed that she is currently taking 12.'5mg'$  3 times per day  PRN -discussed about her stress of seeing many different doctors -advised to always use the lowest dose of medications that she needs to minimize side effects -discussed the buspar that was started for her by Dr. Kerman Passey- she has not yet noted benefits from this, advised that she does not need to take it if she does not feel benefits.  -referred to psychiatry in [facility] for CBT -reinforced importance of thinking positively.  -continue to encourage non-pharmacologic management approaches as well, such as meditation, applying lavender oil, and exercise- all of which she has been trying and which have been helping.  -discussed  alternative anxiety medications.  -continue seroquel 12.'5mg'$  daily and '25mg'$  at night. Can decreased to '25mg'$  at night and assess positive/negative effects during the day. Can restart 1/2 tab during the day if anxiety is uncontrolled.  -discontinue lorazepam.  -establish care with [name] for neuropsych counseling on 8/2. They really valued the appointment with him.  -discontinue topamax in case pain in right side is due to medication- discussed can also be due to antibiotics which she is taking for respiratory infection- discontinuing the topamax will help [place] know if it is the cause of her pain or not. -refilled [name] '30mg'$  HS.  -refilled Cymbalta '60mg'$  -Discussed exercise and meditation as tools to decrease anxiety. -Recommended Down Dog Yoga app -Discussed spending time outdoors. -Discussed positive re-framing of anxiety.  -Discussed the following foods that have been show to reduce anxiety: 1) [place] nuts, mushrooms, soy beans due to their high selenium content. Upper limit of toxicity of selenium is 435mg/day so no more than 3-4 bBolivianuts per day.  2) Fatty fish such as salmon, mackerel, sardines, trout, and herring- high in omega-3 fatty acids 3) Eggs- increases serotonin and dopamine 4) Pumpkin seeds- high in omega-3 fatty acids 5) dark chocolate- high in  flavanols that increase blood flow to brain 6) turmeric- take with black pepper to increase absorption 7) chamomile tea- antioxidant and anti-inflammatory properties 8) yogurt without sugar- supports gut-brain axis 9) green tea- contains L- theanine 10) blueberries- high in vitamin C and antioxidants 11) t[place] high in tryptophan which gets converted to serotonin 12) bell peppers- rich in vitamin C and antioxidants 13) citrus fruits- rich in vitamin C and antioxidants 14) almonds- high in vitamin E and healthy fats 15) chia seeds- high in omega-3 fatty acids  2) MS -continue home therapy to minimize stiffness/maximize mobility -f/u with MS specialist. Discussed her appointmenr with Dr. SKerman Passey  -provided with neurology referral -discussed trying lipoic acid  3) HTN:  -discussed good blood pressure previously BP has been 130s-150s/80s, recommended citrus foods and nuts, as much mobility as she can tolerate, log Bps daily and bring log to f/u appointment.  -continue current regimen and checking and checking BP.  -advised citrus foods and nuts to help lower BP. Discussed that once BP is consistently 120/80 we can wean her Losartan to '25mg'$ .   4) Bilateral lower extremity edema: - continue lymphedema therapy which is greatly helping.  -Lasix discontinued. Discussed that this should also improve her kidney function -advised low salt diet.  -prescribed new compression garments, will fax to company.  -discussed doing the compression garments early in the morning at 9am  -discussed that her kidney function is normal on last check in March.   5) Insomnia: -recommended tart cherry juice with dinner, valerian root and chamomile teas in evening, applying lavender drops to forehead at night. -recommended chamomile tea with saffron a couple hours before bedtime -continue [name] '30mg'$  HS- discussed that we will not try to wean off again since we saw how much this is benefittng  her -discontinue amantadine.  -discussed considering the Inspire trial since he has tried the CPAP for a long time now and can not tolerate it.   6) Obesity BMI 33.13 -attempted to wean off [name] but she developed worsening insomnia, anxiety, and decreased appetite that was undesirable to her, so have restarted the medication.  -discussed trial of avoiding gluten for 3 months and how this could potentially help with her weight loss, anxiety, and prevention/slowing of her chronic diseases. -discussed benefits  of intermittent fasting- made goal to set a consistent dinner time and avoid snacking after this time. If she is hungry, choose a healthy snack of nuts, fruit, or vegetables.  -provided a link to a pdf of Pete Escogue's musculoskeletal alignment exercises: [email or web address]   7) Chest congestion -referred for pulmonary consult with [name], who treated her father for pulmonary fibrosis. She has made an appointment to establish care with him.  -recommended drinking daily warm water with honey, lime and ginger -recommended drinking tea with holy Basil (Tulsi)  8) Sore throat: -continue salt water gargling  9) Sinus congestion: -recommended humidifier -drink 6-8 glasses of water per day.   10) fatigue -checked B12, folate, TSH, vitamin D, magnesium and discussed bloodwork with patient and husband: all in normal range except for suboptimal Vitamin D- high dose supplement prescribed -Recommended continuing NAC (N-Acetyl cysteine) '600mg'$  BID for her fatigue. Discussed its benefits in boosting glutathione and mitochondrial function. -prescribed monthly injectable B12 as patient would like to try this. Discussed that there are few risks to having elevated B12 levels -discussed that not wearing her CPAP is also likely contributing to her fatigue -discussed modafinil but she prefers to try the B12 first   11)  OSA -discussed that OSA can contribute to daytime fatigue, weight gain, anxiety, and chronic conditions so it is good to get this condition treated. -recommended follow-up sleep study for re-eval -referred to Dr. Melida Quitter for eval for [facility] given that she is having a hard time sleeping with her CPAP. Discussed her decision not to pursue Inspire at this time and instead try to tolerate her current mask as best as possible. Discussed her positive conversation with [name]. -advised her to talk to her respiratory therapist about fitting another mask -discussed Inspire but she says unfortunately it is not covered by insurance.  -discussed strap under the chin.  -discussed following up with pulmonology for Inspire.  -agreed with her decision to continue to try CPAP before pursuing Inspire due to her fears of the procedure  12) Low back pain: -recommended doTerra Deep Blue Essential oil and applied to area of pain today- discussed that this is made of natural plant oils. Shared by personal experience of benefit from use of this essential oil -provided a link to a pdf of Pete Escogue's musculoskeletal alignment exercises: [email or web address]   13) Lower extremity weakness: -discussed that this could be a possible side effect of the Risperdal.  -Recommended calling Dr. Garth Bigness office to let him know about the lower extremity weakness -discussed that she should go to the ED as unilateral lower extremity weakness could be a symptom of stroke or MS flare -discussed stopping Risperdal until she can talk with [name] -Discussed using Seroquel prn for anxiety in the meantime  14) Depression -discussed decreasing Zoloft dose back to '150mg'$  since this dose had been working well for her and since she has been experiencing suicidal ideations with higher dose -discussed that suicidal ideations are common side effects of depression medications,  that sometimes these medications can work effectively to treat depression but other times they give patients the energy to act on negative thoughts -encouraged that she call her kids to let them know how important it is to her to be able to see her grandchildren -discussed ECT with her caregiver  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 -recommended heat, range of motion, use of baclofen -will plan for 500U Dysport next visit -recommended increasing Baclofen to '15mg'$  TID PRN  16) Frequent falls -  discussed that her facility is planning to send her to her facility's SNF -discussed that she will need to have NFL2 form filled by her PCP -discussed that the rehab and exercise she gets there will also help with her depression  17) Nausea -refilled zofran  7 minutes spent in discussion of her depression, suicidal ideations with her dose increase in Zoloft to '200mg'$  by her psychiatrist.

## 2022-08-11 ENCOUNTER — Encounter (INDEPENDENT_AMBULATORY_CARE_PROVIDER_SITE_OTHER): Payer: Self-pay

## 2022-08-11 DIAGNOSIS — R2681 Unsteadiness on feet: Secondary | ICD-10-CM | POA: Diagnosis not present

## 2022-08-11 DIAGNOSIS — M6281 Muscle weakness (generalized): Secondary | ICD-10-CM | POA: Diagnosis not present

## 2022-08-11 DIAGNOSIS — R262 Difficulty in walking, not elsewhere classified: Secondary | ICD-10-CM | POA: Diagnosis not present

## 2022-08-11 DIAGNOSIS — G35 Multiple sclerosis: Secondary | ICD-10-CM | POA: Diagnosis not present

## 2022-08-11 DIAGNOSIS — R279 Unspecified lack of coordination: Secondary | ICD-10-CM | POA: Diagnosis not present

## 2022-08-11 DIAGNOSIS — H93293 Other abnormal auditory perceptions, bilateral: Secondary | ICD-10-CM | POA: Diagnosis not present

## 2022-08-11 DIAGNOSIS — R41841 Cognitive communication deficit: Secondary | ICD-10-CM | POA: Diagnosis not present

## 2022-08-11 DIAGNOSIS — H6123 Impacted cerumen, bilateral: Secondary | ICD-10-CM | POA: Diagnosis not present

## 2022-08-12 ENCOUNTER — Other Ambulatory Visit: Payer: Self-pay | Admitting: Neurology

## 2022-08-12 ENCOUNTER — Telehealth: Payer: Self-pay | Admitting: Neurology

## 2022-08-12 DIAGNOSIS — R41841 Cognitive communication deficit: Secondary | ICD-10-CM | POA: Diagnosis not present

## 2022-08-12 DIAGNOSIS — R2681 Unsteadiness on feet: Secondary | ICD-10-CM | POA: Diagnosis not present

## 2022-08-12 DIAGNOSIS — M6281 Muscle weakness (generalized): Secondary | ICD-10-CM | POA: Diagnosis not present

## 2022-08-12 DIAGNOSIS — R262 Difficulty in walking, not elsewhere classified: Secondary | ICD-10-CM | POA: Diagnosis not present

## 2022-08-12 DIAGNOSIS — G35 Multiple sclerosis: Secondary | ICD-10-CM | POA: Diagnosis not present

## 2022-08-12 DIAGNOSIS — R279 Unspecified lack of coordination: Secondary | ICD-10-CM | POA: Diagnosis not present

## 2022-08-12 NOTE — Telephone Encounter (Signed)
Called Krista back. She reports pt is having increased spasms/pain. She has been up since 4am this morning. She was given baclofen at 4:30am, gabapentin at 5:15am and tylenol at 6am.  She got there around 7am to be with the pt. Noticed L leg jumping/toes drawing up. She gave her a flexeril around 7:45am.   She just got her up to the kitchen table to eat some breakfast. However, they feel adjustments need to be made to her medications at this point. Feel they are not helps spasms/pain.  Aware I will send to MD to review and we will call back with his recommendation.

## 2022-08-12 NOTE — Telephone Encounter (Signed)
[name] is calling. Stated pt feels like her body is cramping up and the left side is really bad this morning. [name] is wondering if pt needs to go to hospital. She is requesting a call back from nurse

## 2022-08-12 NOTE — Telephone Encounter (Addendum)
Spoke w/ Dr. He would like her to add 2 extra baclofen for the next couple days. For example, '20mg'$  at 9am, '10mg'$  at 1pm, '20mg'$  at 3pm, '10mg'$  at 5pm, '10mg'$  at 9pm.  I called [name] and relayed this. Also reminded them of her appt with [name] tomorrow at 1000am. Asked they check in at 930am to make sure to give [place] enough time to do a memory test. She verbalized understanding.

## 2022-08-13 ENCOUNTER — Encounter: Payer: Self-pay | Admitting: Neurology

## 2022-08-13 ENCOUNTER — Ambulatory Visit (INDEPENDENT_AMBULATORY_CARE_PROVIDER_SITE_OTHER): Payer: Medicare Other | Admitting: Neurology

## 2022-08-13 ENCOUNTER — Telehealth (HOSPITAL_COMMUNITY): Payer: Self-pay | Admitting: Internal Medicine

## 2022-08-13 ENCOUNTER — Other Ambulatory Visit: Payer: Self-pay | Admitting: Neurology

## 2022-08-13 ENCOUNTER — Telehealth: Payer: Self-pay | Admitting: Neurology

## 2022-08-13 VITALS — BP 143/78 | HR 103 | Ht 63.0 in

## 2022-08-13 DIAGNOSIS — R252 Cramp and spasm: Secondary | ICD-10-CM

## 2022-08-13 DIAGNOSIS — G35 Multiple sclerosis: Secondary | ICD-10-CM

## 2022-08-13 DIAGNOSIS — G4733 Obstructive sleep apnea (adult) (pediatric): Secondary | ICD-10-CM

## 2022-08-13 DIAGNOSIS — G4700 Insomnia, unspecified: Secondary | ICD-10-CM | POA: Diagnosis not present

## 2022-08-13 DIAGNOSIS — F419 Anxiety disorder, unspecified: Secondary | ICD-10-CM

## 2022-08-13 MED ORDER — LORAZEPAM 0.5 MG PO TABS: 0.5000 mg | ORAL_TABLET | Freq: Three times a day (TID) | ORAL | 3 refills | Status: DC

## 2022-08-13 MED ORDER — GABAPENTIN 100 MG PO CAPS: ORAL_CAPSULE | ORAL | 5 refills | Status: DC

## 2022-08-13 MED ORDER — BACLOFEN 10 MG PO TABS: ORAL_TABLET | ORAL | 5 refills | Status: DC

## 2022-08-13 NOTE — Progress Notes (Signed)
[facility]  PATIENT: Carly [name] DOB: 10-22-1950  REFERRING DOCTOR OR PCP: Einar Pheasant, MD SOURCE: Patient, notes from neurology Larence Penning, [facility], [facility] neurology), imaging and lab reports, MRI images personally reviewed  _________________________________   HISTORICAL  CHIEF COMPLAINT:  Chief Complaint  Patient presents with   Follow-up    Pt in room #2 with her husband and care giver. Pt here today for f/u MS and memory loss.    HISTORY OF PRESENT ILLNESS:  Carly [name] is a 71 y.o. woman. 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  Update 08/13/2022: She states she is not doing well.   She has more issues with her memory and spasticity.    She has more pains in her legs.  She is needing more medications earlier in the day.   She is on baclofen 10 mg 6 a day, She feels the baclofen generally helps her spasticity but less so recently.   She is also on Flexeril 2 a day and it helps her sleep.   She is on gabapentin 100 mg po bid.  She is again stating everything was worse when she went off the steroids ad even though she is back on she is not feeling well.  Of note, she was still complaining at every  visit while on the steroid.     She states she is doing worse.   She feels everything is worse since starting the high dose steroid.    I told her again that I feel the risks of high dose steroids are higher than potential benefit.     She is also on dalfampridine and feels she gets some benefit from it.  She is not ale to take steps with a walker anymore.    She uses a wheelchair more.      Her PPMS seems to be mostly stable though she is noting more spasticity pain and continued severe anxiety.   She feels anxiety is still a big problem.  She is seeing a new psychiatry practice and some changes in medications were made this week.   She also notes depression.    She is on Zoloft (was 200 mg then 100 mg and now 150 mg)  Because  anxiety was bad last month, I had her titrate off mirtazapine and start temazepam at bedtime.  We also had her re-try buspirone and continue the duloxetine.   She is also on Seroquel 50 mg at bedtime.   She takes 12.5 Seroquel once or twice during the day.  Sometimes the Seroquel makes her sleepy during the day and other times she tolerates it better.  It does not always help her anxiety during the day.  We tried a lower dose of mirtazapine but she feels worse.   She also had trouble getting off Remeron in the past.   She feels anxiety is worse since going off the mirtazapine though her family/caregiver feel it is about the same.   She continues to report a lot of pain and spasms.     She is at [facility] and does PT 2 times a week.    She also does speech/cogniive therapy twice a week.  She has anxiety.   She was with [facility] but is now being referred to [facility].     Currently, gait is poor .  She is wheelchair bound.  A Hoyer lift is now needed since she is having more difficulty with her transfers.  She has left > right leg weakness and spasticity. She was using a walker but no longer doing so.    She denies numbness or tingling   Bladder is fine.   She wears Depends bt does very well during the day.   She has reduced vision  She was diagnosed with OSA and just started Auto-PAP lin January 2023 with ARES HST 08/08/2021 showed AHI = 44.    She sees Dr. Raul Del for the OSA.   She has excessive daytime sleepiness and feels it may be better on the CPAP.  She has tried a couple different masks and feels the current mask has done best..     She uses CPAP 7 hours a night recently.   Her mask sometimes leaks but she is able to wear it up to 9 hours.  She has gained weight over the last year.  Of note, she is on both mirtazapine and Seroquel.     08/13/2022    9:56 AM 03/10/2017    9:46 AM  MMSE - Mini Mental State Exam  Orientation to time 3 5  Orientation to Place 5 5  Registration  3 3  Attention/ Calculation 1 5  Recall 2 3  Language- name 2 objects 2 2  Language- repeat 1 1  Language- follow 3 step command 1 3  Language- read & follow direction 1 1  Write a sentence 1 1  Copy design 1 1  Copy design-comments Patient can't draw.   Total score 21 30      MS HISTORY: She was diagnosed with MS in 2012 after presenting with progressive left greater than right leg weakness and reduced gait.  She had an MRI of the brain and was found to have severe white matter changes.  In retrospect, she noted that she had difficulties with her left leg including a limp for many years t before the MRI, much worse in hot weather.     She felt she fluctuated a lot without much progression for many years before she started to progress more continuously..     Because of the severe extent of white matter changes, not completely typical for MS alternative diagnoses including CADASIL were considered.  The notch 3 PCR was negative.  CSF showed greater than 5 oligoclonal bands.  Therefore, she was diagnosed with primary progressive MS as her time course was more consistent with that then secondary progressive MS.  She had earlier been placed on Tysabri (Dr. Jacqulynn Cadet) around 2014 for a couple years and then switched to [facility] in 2017.  However she felt that she progressed more well on Ocrevus then not on the medication and it was stopped after 4 courses.  She more recently has been seeing Dr. Pamalee Leyden and is on once a month IV Solu-Medrol.  She does not think it is helping much.  She had a small stroke 11/28/2020 involving the deep white matter of the left frontal  lobe causing right arm weakness    She did have some improvement.   Imaging: MRI of the cervical spine 12/19/2010 showed foci adjacent to C2 to the left, C2-C3 posteriorly C3-C4 posterolaterally to the left, C5-C6 laterally bilaterally   The MRI was followed up with an MRI of the brain 01/07/2011.  And MRI 03/12/2016 both showing a stable  pattern of severe white matter hyperintense signal changes with some involvement of the anterior temporal lobes, external capsules and basal ganglia.  The pattern is fairly symmetric.  She also has  moderate cortical atrophy.  MRI of the cervical and thoracic spine 04/02/2018 show a similar extent of plaque in the cervical spine as in 2012 though some of the foci are more apparent.  The thoracic spine shows multiple foci including a large focus laterally to the left adjacent to T8-T9 likely playing a role in her left leg weakness  MRI of the brain 11/28/2020 shows a mostly stable pattern of severe white matter change and a couple foci in the pons and left cerebellar hemisphere.  The MRI also showed a small stroke in the left corona radiata with DWI changes much more consistent with infarction than MS.  Additionally there is atrophy that have progressed mildly compared to 2017.  The MR angiogram 12/09/2020 showed mild stenosis in the PCAs but no major stenosis or occlusion.    REVIEW OF SYSTEMS: Constitutional: No fevers, chills, sweats, or change in appetite.  She has insomnia. Eyes: No visual changes, double vision, eye pain Ear, nose and throat: No hearing loss, ear pain, nasal congestion, sore throat Cardiovascular: No chest pain, palpitations Respiratory:  No shortness of breath at rest or with exertion.   No wheezes GastrointestinaI: No nausea, vomiting, diarrhea, abdominal pain, fecal incontinence Genitourinary:  No dysuria, urinary retention or frequency.  No nocturia. Musculoskeletal:  No neck pain, back pain Integumentary: No rash, pruritus, skin lesions.  She has ankle edema, left greater than right Neurological: as above Psychiatric: She has severe anxiety. Endocrine: No palpitations, diaphoresis, change in appetite, change in weigh or increased thirst Hematologic/Lymphatic:  No anemia, purpura, petechiae. Allergic/Immunologic: No itchy/runny eyes, nasal congestion, recent allergic  reactions, rashes  ALLERGIES: Allergies  Allergen Reactions   Ibuprofen Swelling and Other (See Comments)   Sulfamethoxazole-Trimethoprim Itching   Penicillin G Rash   Asa [Aspirin] Swelling   Buspirone Other (See Comments)    Headaches     Levofloxacin Other (See Comments)    "weakness"   Lexapro [Escitalopram Oxalate] Other (See Comments)    Weakness    Pollen Extract Hives   Ceftin [Cefuroxime Axetil] Rash   Penicillins Rash   Sulfa Antibiotics Rash    HOME MEDICATIONS:  Current Outpatient Medications:    acetaminophen (TYLENOL) 500 MG tablet, Take 1 tablet (500 mg total) by mouth every 4 (four) hours as needed., Disp: 30 tablet, Rfl: 0   albuterol (VENTOLIN HFA) 108 (90 Base) MCG/ACT inhaler, Inhale 2 puffs into the lungs every 6 (six) hours as needed., Disp: 18 g, Rfl: 0   amLODipine (NORVASC) 5 MG tablet, TAKE 1 TABLET BY MOUTH DAILY., Disp: 90 tablet, Rfl: 1   azelastine (ASTELIN) 0.1 % nasal spray, SMARTSIG:1-2 Spray(s) Both Nares Twice Daily, Disp: , Rfl:    busPIRone (BUSPAR) 15 MG tablet, Take 1 tablet (15 mg total) by mouth 2 (two) times daily. (Patient taking differently: Take 15 mg by mouth 3 (three) times daily.), Disp: 60 tablet, Rfl: 5   Cholecalciferol (D3-1000 PO), Take 2,000 Units by mouth daily., Disp: , Rfl:    clopidogrel (PLAVIX) 75 MG tablet, TAKE 1 TABLET (75 MG) BY MOUTH EVERY DAY, Disp: 90 tablet, Rfl: 1   cyanocobalamin (,VITAMIN B-12,) 1000 MCG/ML injection, Inject 1 mL (1,000 mcg total) into the muscle every 30 (thirty) days., Disp: 1 mL, Rfl: 3   cyclobenzaprine (FLEXERIL) 5 MG tablet, Take 1 tablet (5 mg total) by mouth in the morning and at bedtime., Disp: 60 tablet, Rfl: 5   dalfampridine 10 MG TB12, Take 1 tablet (10 mg total) by mouth every  12 (twelve) hours., Disp: 180 tablet, Rfl: 3   docusate sodium (COLACE) 100 MG capsule, Take 100 mg by mouth 2 (two) times daily., Disp: , Rfl:    fluticasone (FLONASE) 50 MCG/ACT nasal spray, Place into  the nose., Disp: , Rfl:    furosemide (LASIX) 20 MG tablet, Take 20 mg by mouth daily., Disp: , Rfl:    LORazepam (ATIVAN) 0.5 MG tablet, Take 1 tablet (0.5 mg total) by mouth every 8 (eight) hours., Disp: 90 tablet, Rfl: 3   losartan (COZAAR) 50 MG tablet, TAKE 1 TABLET BY MOUTH DAILY, Disp: 90 tablet, Rfl: 1   magnesium oxide (MAG-OX) 400 MG tablet, TAKE 1 TABLET BY MOUTH DAILY, Disp: 30 tablet, Rfl: 5   METAMUCIL FIBER PO, Take by mouth 2 (two) times daily., Disp: , Rfl:    modafinil (PROVIGIL) 200 MG tablet, Take 1 tablet in the morning and another tablet 4-5 hours later, Disp: 60 tablet, Rfl: 3   nystatin cream (MYCOSTATIN), Apply 1 application topically 2 (two) times daily., Disp: 30 g, Rfl: 11   ondansetron (ZOFRAN) 4 MG tablet, Take 1 tablet (4 mg total) by mouth every 8 (eight) hours as needed for nausea or vomiting., Disp: 20 tablet, Rfl: 0   pantoprazole (PROTONIX) 40 MG tablet, Take 40 mg by mouth daily., Disp: , Rfl:    rosuvastatin (CRESTOR) 5 MG tablet, TAKE 1 TABLET BY MOUTH EVERY MONDAY, WEDNESDAY AND FRIDAY., Disp: 90 tablet, Rfl: 1   saccharomyces boulardii (FLORASTOR) 250 MG capsule, Take 1 capsule (250 mg total) by mouth 2 (two) times daily., Disp: 60 capsule, Rfl: 0   sertraline (ZOLOFT) 100 MG tablet, Take 150 mg by mouth daily., Disp: , Rfl:    sodium chloride (OCEAN) 0.65 % nasal spray, Place 1-2 sprays into the nose as needed., Disp: , Rfl:    baclofen (LIORESAL) 10 MG tablet, Takes '10mg'$  q 4 hours, Disp: 180 each, Rfl: 5   gabapentin (NEURONTIN) 100 MG capsule, One po qAM, one po qPM and two po qHS, Disp: 120 capsule, Rfl: 5 No current facility-administered medications for this visit.  Facility-Administered Medications Ordered in Other Visits:    0.9 %  sodium chloride infusion, , Intravenous, Continuous, Corcoran, Melissa C, MD, Last Rate: 10 mL/hr at 04/23/22 1127, Continued from Pre-op at 04/23/22 1127   acetaminophen (TYLENOL) tablet 650 mg, 650 mg, Oral, Once,  Corcoran, Drue Second, MD  PAST MEDICAL HISTORY: Past Medical History:  Diagnosis Date   Allergy    Anxiety    Aspiration pneumonia ([place])    Depression    Frequent headaches    H/O   GERD (gastroesophageal reflux disease)    History of chicken pox    History of colon polyps    Hx of migraines    Hypertension    Lymphedema    Multiple sclerosis ([place]) 2011   OSA (obstructive sleep apnea)    PONV (postoperative nausea and vomiting)    Scoliosis     PAST SURGICAL HISTORY: Past Surgical History:  Procedure Laterality Date   BACK SURGERY     CHOLECYSTECTOMY     COLONOSCOPY N/A 04/23/2022   Procedure: COLONOSCOPY;  Surgeon: Toledo, Benay Pike, MD;  Location: [facility] ENDOSCOPY;  Service: Gastroenterology;  Laterality: N/A;   COLONOSCOPY WITH PROPOFOL N/A 05/18/2017   Procedure: COLONOSCOPY WITH PROPOFOL;  Surgeon: [name], MD;  Location: [facility] ENDOSCOPY;  Service: Endoscopy;  Laterality: N/A;   FOOT SURGERY  2015   GALLBLADDER SURGERY  2008  HARDWARE REMOVAL Left 02/14/2016   Procedure: LEFT FOOT REMOVAL DEEP IMPLANT;  Surgeon: [name], MD;  Location: [facility];  Service: Orthopedics;  Laterality: Left;   HERNIA REPAIR     Inguinal Hernia Repair   SPINE SURGERY  2014    FAMILY HISTORY: Family History  Problem Relation Age of Onset   Arthritis Mother    Hypertension Mother    Macular degeneration Mother    Hypertension Father    Hyperlipidemia Father    Heart disease Maternal Grandfather    Diabetes Maternal Grandfather    Kidney disease Paternal Grandmother     SOCIAL HISTORY:  Social History   Socioeconomic History   Marital status: Married    Spouse name: Engineer, technical sales   Number of children: 3   Years of education: 14   Highest education level: Associate degree: academic program  Occupational History   Not on file  Tobacco Use   Smoking status: Never   Smokeless tobacco: Never  Vaping Use   Vaping Use: Never used  Substance and Sexual Activity    Alcohol use: No    Alcohol/week: 0.0 standard drinks of alcohol   Drug use: No   Sexual activity: Not Currently  Other Topics Concern   Not on file  Social History Narrative   Lives with husband    Right handed   Caffeine: 1 cup of coffee in the AM, coke occa. Trying to come off caffeine    Social Determinants of Health   Financial Resource Strain: Low Risk  (01/07/2022)   Overall Financial Resource Strain (CARDIA)    Difficulty of Paying Living Expenses: Not hard at all  Food Insecurity: No Food Insecurity (01/07/2022)   Hunger Vital Sign    Worried About Running Out of Food in the Last Year: Never true    Ran Out of Food in the Last Year: Never true  Transportation Needs: No Transportation Needs (01/07/2022)   PRAPARE - Hydrologist (Medical): No    Lack of Transportation (Non-Medical): No  Physical Activity: Insufficiently Active (01/07/2022)   Exercise Vital Sign    Days of Exercise per Week: 2 days    Minutes of Exercise per Session: 60 min  Stress: No Stress Concern Present (01/07/2022)   [place]    Feeling of Stress : Not at all  Social Connections: Unknown (01/07/2022)   Social Connection and Isolation Panel [NHANES]    Frequency of Communication with Friends and Family: Not on file    Frequency of Social Gatherings with Friends and Family: Not on file    Attends Religious Services: Not on file    Active Member of Clubs or Organizations: Not on file    Attends Archivist Meetings: Not on file    Marital Status: Married  Intimate Partner Violence: Not At Risk (01/07/2022)   Humiliation, Afraid, Rape, and Kick questionnaire    Fear of Current or Ex-Partner: No    Emotionally Abused: No    Physically Abused: No    Sexually Abused: No     PHYSICAL EXAM  Vitals:   08/13/22 0941  BP: (!) 143/78  Pulse: (!) 103  Height: '5\' 3"'$  (1.6 m)    Body mass index is  35.43 kg/m.   General: The patient is well-developed and well-nourished and in no acute distress   Extremities: She has bilateral edema involving the foot and ankle.    Musculoskeletal:  Back is nontender   Neurologic Exam   Mental status: Anxiety is better than last visit.  The patient is alert and oriented x 3 at the time of the examination. The patient has apparent normal recent and remote memory, with an apparently normal attention span and concentration ability.   Speech is normal.   Cranial nerves: Extraocular movements are full.   Facial strength and sensation was normal.   No obvious hearing deficits are noted.   Motor:  Muscle bulk is normal.   Tone is increased in the legs, left greater than right. Strength is  5 / 5 in the left arm and 4/5 in the right arm with reduced rapid altering movements. ,  Strength is 3/5 in the right iliopsoas and 2 -/5 on the left.  Reduced strength elsewhere in the legs worse on the left   Sensory: Sensory testing is intact to pinprick, soft touch and vibration sensation in all 4 extremities.   Coordination: Cerebellar testing reveals good finger-nose-finger but reduced left heel-to-shin.   Gait and station: She is wheelchair-bound.  Reflexes: Deep tendon reflexes are symmetric and normal in the arm but the left knee reflex is greater than the right and she has spread.        DIAGNOSTIC DATA (LABS, IMAGING, TESTING) - I reviewed patient records, labs, notes, testing and imaging myself where available.  Lab Results  Component Value Date   WBC 10.3 03/28/2022   HGB 13.4 03/28/2022   HCT 41.8 03/28/2022   MCV 83.9 03/28/2022   PLT 325 03/28/2022      Component Value Date/Time   NA 141 03/28/2022 1455   NA 141 11/12/2021 1147   NA 138 09/18/2014 1048   K 3.8 03/28/2022 1455   K 4.0 09/18/2014 1048   CL 106 03/28/2022 1455   CL 101 09/18/2014 1048   CO2 26 03/28/2022 1455   CO2 27 09/18/2014 1048   GLUCOSE 177 (H) 03/28/2022 1455    GLUCOSE 88 09/18/2014 1048   BUN 14 03/28/2022 1455   BUN 12 11/12/2021 1147   BUN 9 09/18/2014 1048   CREATININE 0.68 03/28/2022 1455   CREATININE 0.71 04/11/2020 0923   CALCIUM 9.5 03/28/2022 1455   CALCIUM 9.5 09/18/2014 1048   PROT 7.4 03/28/2022 1455   PROT 7.8 09/18/2014 1048   ALBUMIN 4.5 03/28/2022 1455   ALBUMIN 4.6 03/29/2018 1303   ALBUMIN 4.0 09/18/2014 1048   AST 23 03/28/2022 1455   AST 23 09/18/2014 1048   ALT 24 03/28/2022 1455   ALT 42 09/18/2014 1048   ALKPHOS 81 03/28/2022 1455   ALKPHOS 161 (H) 09/18/2014 1048   BILITOT 0.6 03/28/2022 1455   BILITOT 0.3 09/18/2014 1048   GFRNONAA >60 03/28/2022 1455   GFRNONAA >60 09/18/2014 1048   GFRAA >60 12/20/2017 2130   GFRAA >60 09/18/2014 1048   Lab Results  Component Value Date   CHOL 154 03/07/2022   HDL 69 03/07/2022   LDLCALC 58 03/07/2022   LDLDIRECT 127.0 08/17/2020   TRIG 160 (H) 03/07/2022   CHOLHDL 2.2 03/07/2022   Lab Results  Component Value Date   HGBA1C 5.8 (H) 03/07/2022   Lab Results  Component Value Date   VITAMINB12 682 03/07/2022   Lab Results  Component Value Date   TSH 2.050 03/07/2022       ASSESSMENT AND PLAN  MS (multiple sclerosis) (HCC)  Spasticity  Anxiety  Insomnia, unspecified type  OSA on CPAP   We discussed the BTK inhibitors  in clinical trials.  Hopefully one of them will show some benefit in progressive MS. Spasticity is her worst issue right now.  She is taking baclofen 60 mg a day most days.  Additionally she takes low-dose Flexeril twice a day.  The Flexeril makes her sleepy.  She is also on a low-dose of gabapentin that she does not think has helped.  We will increase the gabapentin to 400 g a day, continue the baclofen at 50 to 60 mg a day split dose and add lorazepam 0.5 mg 3 times daily.   Anxiety continues to be a problem for her.  Hopefully the addition of lorazepam will be helpful.  She is in the process of scheduling behavioral health as  well. Continue CPAP with mask of choice. Rtc 4 months  43 minute office visit with the majority of the time spent face-to-face for history and physical, discussion/counseling and decision-making.  Additional time with record review and documentation.  Geanette Buonocore A. Felecia Shelling, MD, PhD, FAAN Certified in Neurology, Clinical Neurophysiology, Sleep Medicine, Pain Medicine and Neuroimaging Director, [facility] [address] [place], [place] [address] [phone]

## 2022-08-13 NOTE — Telephone Encounter (Signed)
[name] is calling. Stated she wants to talk to nurse about a prescription Trazondone '100mg'$ . Stated pt was taking this medication while in rehab and is requesting a refill. Stated this help pt sleep at nigh. She is requesting a call back from nurse

## 2022-08-13 NOTE — Telephone Encounter (Signed)
Error

## 2022-08-13 NOTE — Telephone Encounter (Signed)
LVM for [facility] relaying Dr. Garth Bigness message. Asked her to call back tomorrow to let us know how they would like to proceed.

## 2022-08-14 DIAGNOSIS — Z9181 History of falling: Secondary | ICD-10-CM | POA: Diagnosis not present

## 2022-08-14 DIAGNOSIS — R262 Difficulty in walking, not elsewhere classified: Secondary | ICD-10-CM | POA: Diagnosis not present

## 2022-08-14 DIAGNOSIS — Z8673 Personal history of transient ischemic attack (TIA), and cerebral infarction without residual deficits: Secondary | ICD-10-CM | POA: Diagnosis not present

## 2022-08-14 DIAGNOSIS — R279 Unspecified lack of coordination: Secondary | ICD-10-CM | POA: Diagnosis not present

## 2022-08-14 DIAGNOSIS — R2681 Unsteadiness on feet: Secondary | ICD-10-CM | POA: Diagnosis not present

## 2022-08-14 DIAGNOSIS — R41841 Cognitive communication deficit: Secondary | ICD-10-CM | POA: Diagnosis not present

## 2022-08-14 DIAGNOSIS — G35 Multiple sclerosis: Secondary | ICD-10-CM | POA: Diagnosis not present

## 2022-08-14 DIAGNOSIS — M6281 Muscle weakness (generalized): Secondary | ICD-10-CM | POA: Diagnosis not present

## 2022-08-14 NOTE — Telephone Encounter (Signed)
LVM returning call from [place]. Asked her to call back.

## 2022-08-14 NOTE — Telephone Encounter (Signed)
Carly [name] is calling. Requesting a call-back from the nurse.

## 2022-08-15 DIAGNOSIS — R2681 Unsteadiness on feet: Secondary | ICD-10-CM | POA: Diagnosis not present

## 2022-08-15 DIAGNOSIS — M6281 Muscle weakness (generalized): Secondary | ICD-10-CM | POA: Diagnosis not present

## 2022-08-15 DIAGNOSIS — R262 Difficulty in walking, not elsewhere classified: Secondary | ICD-10-CM | POA: Diagnosis not present

## 2022-08-15 DIAGNOSIS — G35 Multiple sclerosis: Secondary | ICD-10-CM | POA: Diagnosis not present

## 2022-08-15 DIAGNOSIS — R41841 Cognitive communication deficit: Secondary | ICD-10-CM | POA: Diagnosis not present

## 2022-08-15 DIAGNOSIS — R279 Unspecified lack of coordination: Secondary | ICD-10-CM | POA: Diagnosis not present

## 2022-08-18 ENCOUNTER — Other Ambulatory Visit: Payer: Self-pay | Admitting: Family

## 2022-08-18 ENCOUNTER — Encounter
Payer: Medicare Other | Attending: Physical Medicine and Rehabilitation | Admitting: Physical Medicine and Rehabilitation

## 2022-08-18 ENCOUNTER — Encounter: Payer: Self-pay | Admitting: Hematology and Oncology

## 2022-08-18 ENCOUNTER — Encounter: Payer: Medicare Other | Admitting: Physical Medicine and Rehabilitation

## 2022-08-18 DIAGNOSIS — R262 Difficulty in walking, not elsewhere classified: Secondary | ICD-10-CM | POA: Diagnosis not present

## 2022-08-18 DIAGNOSIS — M6281 Muscle weakness (generalized): Secondary | ICD-10-CM | POA: Diagnosis not present

## 2022-08-18 DIAGNOSIS — R410 Disorientation, unspecified: Secondary | ICD-10-CM | POA: Insufficient documentation

## 2022-08-18 DIAGNOSIS — R252 Cramp and spasm: Secondary | ICD-10-CM | POA: Diagnosis not present

## 2022-08-18 DIAGNOSIS — G35 Multiple sclerosis: Secondary | ICD-10-CM | POA: Diagnosis not present

## 2022-08-18 DIAGNOSIS — F32A Depression, unspecified: Secondary | ICD-10-CM | POA: Insufficient documentation

## 2022-08-18 DIAGNOSIS — R279 Unspecified lack of coordination: Secondary | ICD-10-CM | POA: Diagnosis not present

## 2022-08-18 DIAGNOSIS — F411 Generalized anxiety disorder: Secondary | ICD-10-CM | POA: Diagnosis not present

## 2022-08-18 DIAGNOSIS — R41841 Cognitive communication deficit: Secondary | ICD-10-CM | POA: Diagnosis not present

## 2022-08-18 DIAGNOSIS — R2681 Unsteadiness on feet: Secondary | ICD-10-CM | POA: Diagnosis not present

## 2022-08-18 NOTE — Progress Notes (Signed)
Subjective:    Patient ID: Carly [name], female    DOB: 20-Jan-1951, 71 y.o.   MRN: [id number]  HPI  An audio/video tele-health visit is felt to be the most appropriate encounter for this patient at this time. This is a follow up tele-visit via phone. The patient is at home. MD is at office. Prior to scheduling this appointment, our staff discussed the limitations of evaluation and management by telemedicine and the availability of in-person appointments. The patient expressed understanding and agreed to proceed.    Carly [name] is a 71 year old woman who presents for f/u of white matter periventricular CVA, presents for follow-up regarding lymphedema, anxiety,chronic fatigue, insomnia, OSA, and weight gain, and new lower extremity weakness today.    1) Anxiety:  -improved since Dr. Kerman Passey started her on Lorazepam .'5mg'$  at night -she asks what else she can do for this.  -her psychiatrist previously transitioned her from Seroquel back to benzodiazepine due to side effect profile of Seroquel, but this worsened her anxiety so we restarted it. She asks if she can increase Seroquel to '25mg'$  -she needs a new order for Seroquel as her current one only accounts for nighttime dose but she is also taking one prn during the day.  -she is currently a little out of it from the anesthesia for her recent colonoscopy -anxiety has been very severe -she started Zoloft this week and has not yet noted any benefit, she is a little discouraged as felt this should take effect sooner.  -she would like referral to Dr. Sonny Dandy who was recommended to her by [name], she has not yet heard from their office -she really likes following with [name] as well -she feels that her inability to see her grandkids is a trigger for her anxiety. She used to see them mmore in the past and does not know why she doesn;t get to any mores -she does not want to restart her [name] as she does not want the weight gain  associated with this medication -she has only been sleeping 4 hours per night and she feels this worsens her anxiety.  -she has been having a lot of anxiety related to her and her husband's move from their current home into a retirement home and she asks whether she may take the seroquel additionally as needed- once in the morning, once later in the day, and two at night -she is interested in following with a behavioral therapist -she and husband [name] were scared of side effects of Seroquel that were discussed- she has not noted any this far.  -she has been sleeping but her legs feel very stiff at night -she continues to take Cymbalta '60mg'$  -we tried weaning her off the mirtazepine but she experienced weakness and insomnia and wanted to restart- titrated back up to her former dose.  -she discussed with her husband, who is also present on this call today, and she would prefer to restart Seroquel and stop Lorazepam.  -she has not had any counseling and would like to restart this. She has an appointment with [name] in August and would like to start counseling earlier than this if possible.  -she loved speaking with [name] inpatient and would like to follow with him -she had symptoms of dizziness yesterday at the hairdresser and was asked to contact me regarding if this could be from the seroquel. She had had no other similar episodes since starting it.  -she has been sleeping well with  the Seroquel at night.  -she would like a list of foods that can help anxiety.  -she asks about the dietary advice I gave her last visit regarding anxiety. -she has been using essential oil.  -anxiety has been bad -off prednisone now -exercise helps her.  -she gets upset over things easily -she wants to be able to calm down.  -she needs refills of her cymbalta and [name]  2) MS: -her spasticity improved since Dr. Jennye Moccasin started her on 0.'5mg'$  Lorazepam.  -has been having a lot of stiffness  recently, we had discssed increasing the baclofen and this does appear to have helped -EMS called twice due to difficulty getting her up off the floor -is receiving home therapy -she would like to see a different neurologist and asked for my recommendation   3) Lymphedema  -has not been using garment recently -has significant bilateral lower extremity  -has greatly improved with lymphedema therapy -she recently received a device that has helped so much that she was able to get off of Lasix.  -has ordered compression garments -does not eat processed foods or salt.  -she has ordered something from [employer] to give her lymphatic massage.  -improving but still very bothersome to her -discussed bloodwork today  4) HTN: -BP 138/79  5) Decreased appetite:  -resolved with increasing [name]  6) She developed pain in right side, and not sure if this is due to topamax or the antibiotics she is on to treat a respiratory infection.   7) Insomnia:  -sleeping poorly due to her CPAP mask, pressure is rising at night and this is causing mask leak. -sleeping about 5 hours per night and has been referred for another sleep study  -respiratory therapist will seeing her tomorrow.  -she asks about Inspire.  -she follows with [name] and she says he is also a sleep doctor -she hates to reduce the medicine.  -she has tried 5 different masks -the machine is very loud.  -she can sleep without the CPAP fine.   8) Chest congestion -she has been having symptoms of congestion for greater than 3 months -Her father saw 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 She received a call to schedule an appointment with [name] office and is thankful for the referral.  -she asks what else she can do to help with this -she has been through multiple rounds of antibiotics without benefit.  -also feeling diffuse joint aches.   9) GERD -has  been experiencing after eating  10) Sinus congestion -pleased that her CT results were normal.   11) OSA -she notes that she was found to have OSA on recent sleep study and she was tearful about this as she feels it is just another condition she has -sleeping 5 hours per night and has been referred for another sleep study -she was surprised as she feels that she sleeps well.  -she has note been sleeping well while using the CPAP mask and was told that O2 is leaking -she is working with a respiratory therapist -she feels anxiety about whether she will get enough sleep tonight, she has only been sleeping about 4 hours since using the CPAP -she has decided she does not want to pursue Inspire yet until she has learned more about it.  -she had a very productive visit with [name] and he advised her to try her best to tolerate the CPAP mask, which she is doing.  -  followed up with pulmonology regarding [name] and was told she would be a good candidate for this treatment, but she should prefer to continue trying CPAP mask before trying Inspire  12) Chronic fatigue -she was diagnosed with COVID 9 days ago and her breathing has improved but she has been feeling very fatigued -she has been taking the N-acetyl-cysteine supplement -she asks about taking injetable B12 as this really helped her son -she has not been wearing her CPAP as she is waiting for a new mask  13) Pain -takes Tylenol and baclofen -would like to get Dysport next available appointment  14) Lower extremity weakness: -improved -went to ED and there was no evidence of stroke. Thought to be due to her Risperdal -her legs feel stiff -no other focal deficits -she started Risperdal on Wednesday night and asks if this could be causing her weakness -[name] has been trying to wean her off Mirtazapine and Seroquel due to weight gain and onto Risperdal for her anxiety  15) Bilateral lower extremity pain -feels stiff and tight  like spasticity -she asks if she can take extra Baclofen  16) depression --she has been doing better on the '150mg'$  Zoloft -there is another medication she would also like to try -she has been having a hard time getting in touch with her psychiatrist regarding the suicidal ideation she has been experiencing  17) frequent falls -she says she is being admitted to sNF due to recent frequent falls. -she has not had any more falls since admission  18) Nausea -needs refill of Zofran  Pain Inventory Average Pain 5 Pain Right Now 2 My pain is aching and no pain.  Sometimes spasm in legs at night because of MST  In the last 24 hours, has pain interfered with the following? General activity 4 Relation with others 7 Enjoyment of life 5  What TIME of day is your pain at its worst? night Sleep (in general) Fair  Pain is worse with: sitting and unsure Pain improves with: medication Relief from Meds: 5  Family History  Problem Relation Age of Onset   Arthritis Mother    Hypertension Mother    Macular degeneration Mother    Hypertension Father    Hyperlipidemia Father    Heart disease Maternal Grandfather    Diabetes Maternal Grandfather    Kidney disease Paternal Grandmother    Social History   Socioeconomic History   Marital status: Married    Spouse name: Engineer, technical sales   Number of children: 3   Years of education: 14   Highest education level: Associate degree: academic program  Occupational History   Not on file  Tobacco Use   Smoking status: Never   Smokeless tobacco: Never  Vaping Use   Vaping Use: Never used  Substance and Sexual Activity   Alcohol use: No    Alcohol/week: 0.0 standard drinks of alcohol   Drug use: No   Sexual activity: Not Currently  Other Topics Concern   Not on file  Social History Narrative   Lives with husband    Right handed   Caffeine: 1 cup of coffee in the AM, coke occa. Trying to come off caffeine    Social Determinants of Health    Financial Resource Strain: Low Risk  (01/07/2022)   Overall Financial Resource Strain (CARDIA)    Difficulty of Paying Living Expenses: Not hard at all  Food Insecurity: No Food Insecurity (01/07/2022)   Hunger Vital Sign    Worried About Running Out  of Food in the Last Year: Never true    [place] in the Last Year: Never true  Transportation Needs: No Transportation Needs (01/07/2022)   PRAPARE - Hydrologist (Medical): No    Lack of Transportation (Non-Medical): No  Physical Activity: Insufficiently Active (01/07/2022)   Exercise Vital Sign    Days of Exercise per Week: 2 days    Minutes of Exercise per Session: 60 min  Stress: No Stress Concern Present (01/07/2022)   [place]    Feeling of Stress : Not at all  Social Connections: Unknown (01/07/2022)   Social Connection and Isolation Panel [NHANES]    Frequency of Communication with Friends and Family: Not on file    Frequency of Social Gatherings with Friends and Family: Not on file    Attends Religious Services: Not on file    Active Member of Clubs or Organizations: Not on file    Attends Archivist Meetings: Not on file    Marital Status: Married   Past Surgical History:  Procedure Laterality Date   BACK SURGERY     CHOLECYSTECTOMY     COLONOSCOPY N/A 04/23/2022   Procedure: COLONOSCOPY;  Surgeon: Toledo, Benay Pike, MD;  Location: [facility] ENDOSCOPY;  Service: Gastroenterology;  Laterality: N/A;   COLONOSCOPY WITH PROPOFOL N/A 05/18/2017   Procedure: COLONOSCOPY WITH PROPOFOL;  Surgeon: [name], MD;  Location: [facility] ENDOSCOPY;  Service: Endoscopy;  Laterality: N/A;   FOOT SURGERY  2015   GALLBLADDER SURGERY  2008   HARDWARE REMOVAL Left 02/14/2016   Procedure: LEFT FOOT REMOVAL DEEP IMPLANT;  Surgeon: [name], MD;  Location: [place];  Service: Orthopedics;  Laterality: Left;   HERNIA REPAIR      Inguinal Hernia Repair   SPINE SURGERY  2014   Past Surgical History:  Procedure Laterality Date   BACK SURGERY     CHOLECYSTECTOMY     COLONOSCOPY N/A 04/23/2022   Procedure: COLONOSCOPY;  Surgeon: Toledo, Benay Pike, MD;  Location: [facility] ENDOSCOPY;  Service: Gastroenterology;  Laterality: N/A;   COLONOSCOPY WITH PROPOFOL N/A 05/18/2017   Procedure: COLONOSCOPY WITH PROPOFOL;  Surgeon: [name], MD;  Location: [facility] ENDOSCOPY;  Service: Endoscopy;  Laterality: N/A;   FOOT SURGERY  2015   GALLBLADDER SURGERY  2008   HARDWARE REMOVAL Left 02/14/2016   Procedure: LEFT FOOT REMOVAL DEEP IMPLANT;  Surgeon: [name], MD;  Location: [place];  Service: Orthopedics;  Laterality: Left;   HERNIA REPAIR     Inguinal Hernia Repair   SPINE SURGERY  2014   Past Medical History:  Diagnosis Date   Allergy    Anxiety    Aspiration pneumonia (HCC)    Depression    Frequent headaches    H/O   GERD (gastroesophageal reflux disease)    History of chicken pox    History of colon polyps    Hx of migraines    Hypertension    Lymphedema    Multiple sclerosis ([place]) 2011   OSA (obstructive sleep apnea)    PONV (postoperative nausea and vomiting)    Scoliosis    There were no vitals taken for this visit.  Opioid Risk Score:   Fall Risk Score:  `1  Depression screen PHQ 2/9     05/22/2022    4:07 PM 04/09/2022   10:00 AM 01/07/2022    3:15 PM 11/05/2021   10:10  AM 08/27/2021   11:12 AM 08/15/2021   10:50 AM 07/19/2021    3:28 PM  Depression screen PHQ 2/9  Decreased Interest 3 1 0 0 2 0 1  Down, Depressed, Hopeless 3 1 0 1 2 0 1  PHQ - 2 Score 6 2 0 1 4 0 2  Altered sleeping 3 0       Tired, decreased energy 3 1       Change in appetite 3 0       Feeling bad or failure about yourself  3 0       Trouble concentrating 3 0       Moving slowly or fidgety/restless 0 0       Suicidal thoughts 1 0       PHQ-9 Score 22 3       Difficult doing work/chores Somewhat  difficult Not difficult at all         Review of Systems  Constitutional: Negative.   HENT: Negative.    Eyes: Negative.   Respiratory:  Negative for cough.        Chest congestion  Cardiovascular: Negative.   Gastrointestinal: Negative.   Endocrine: Negative.   Genitourinary: Negative.   Musculoskeletal:  Positive for back pain and gait problem.  Skin: Negative.   Allergic/Immunologic: Negative.   Hematological:  Bruises/bleeds easily.       Plavix  Psychiatric/Behavioral:  Positive for dysphoric mood.   All other systems reviewed and are negative.      Objective:   Physical Exam Patient seen via phone     Assessment & Plan:  1) Anxiety: -discussed with patient the side effects of both Seroquel and Lorazepam, as well as potential interactions with other medications. -discussed improvements since Dr. Kerman Passey started her on 0.'5mg'$  Lorazepam at night -recommended drinking chamomile tea with saffron every evening -advised that saffron can be purchased from the spices section of her grocery section and just 1 strand daily can be effective if mood elevation, and it is very good for health.  -recommended increasing Seroquel to '25mg'$  HS and adding an additional '25mg'$  during the day that can be taken once daily or split in half to be taken as 12.'5mg'$  BID PRN.  -discussed referral to psychiatry, placed referral to Dr. Sonny Dandy who was recommended to her by [name], discussed that it may be at least a week before she hears from them. -continue Zoloft, discussed that this will take 2-4 weeks to have effect, reinforced that she may not feel benefit from this yet. Discussed that not everyone response to every medicine and there are alternative options we can try if this is not effective enough.  -discussed benefits of exercise and good nutrition -discussed her move because it is stressing her.  -discussed whether we should increase Seroquel due to her anxiety. Discussed that she is  currently taking 12.'5mg'$  3 times per day PRN -discussed about her stress of seeing many different doctors -advised to always use the lowest dose of medications that she needs to minimize side effects -discussed the buspar that was started for her by Dr. Kerman Passey- she has not yet noted benefits from this, advised that she does not need to take it if she does not feel benefits.  -referred to psychiatry in [facility] for CBT -reinforced importance of thinking positively.  -continue to encourage non-pharmacologic management approaches as well, such as meditation, applying lavender oil, and exercise- all of which she has been trying and which have been helping.  -  discussed alternative anxiety medications.  -continue seroquel 12.'5mg'$  daily and '25mg'$  at night. Can decreased to '25mg'$  at night and assess positive/negative effects during the day. Can restart 1/2 tab during the day if anxiety is uncontrolled.  -discontinue lorazepam.  -establish care with [name] for neuropsych counseling on 8/2. They really valued the appointment with him.  -discontinue topamax in case pain in right side is due to medication- discussed can also be due to antibiotics which she is taking for respiratory infection- discontinuing the topamax will help [place] know if it is the cause of her pain or not. -refilled [name] '30mg'$  HS.  -refilled Cymbalta '60mg'$  -Discussed exercise and meditation as tools to decrease anxiety. -Recommended Down Dog Yoga app -Discussed spending time outdoors. -Discussed positive re-framing of anxiety.  -Discussed the following foods that have been show to reduce anxiety: 1) [place] nuts, mushrooms, soy beans due to their high selenium content. Upper limit of toxicity of selenium is 412mg/day so no more than 3-4 bBolivianuts per day.  2) Fatty fish such as salmon, mackerel, sardines, trout, and herring- high in omega-3 fatty acids 3) Eggs- increases serotonin and dopamine 4) Pumpkin seeds- high in omega-3  fatty acids 5) dark chocolate- high in flavanols that increase blood flow to brain 6) turmeric- take with black pepper to increase absorption 7) chamomile tea- antioxidant and anti-inflammatory properties 8) yogurt without sugar- supports gut-brain axis 9) green tea- contains L- theanine 10) blueberries- high in vitamin C and antioxidants 11) t[place] high in tryptophan which gets converted to serotonin 12) bell peppers- rich in vitamin C and antioxidants 13) citrus fruits- rich in vitamin C and antioxidants 14) almonds- high in vitamin E and healthy fats 15) chia seeds- high in omega-3 fatty acids  2) MS -discussed improvements in anxiety since she was started on 0.'5mg'$  Lorazepam nightly  -continue home therapy to minimize stiffness/maximize mobility -f/u with MS specialist. Discussed her appointmenr with Dr. SKerman Passey  -provided with neurology referral -discussed trying lipoic acid  3) HTN:  -discussed good blood pressure previously BP has been 130s-150s/80s, recommended citrus foods and nuts, as much mobility as she can tolerate, log Bps daily and bring log to f/u appointment.  -continue current regimen and checking and checking BP.  -advised citrus foods and nuts to help lower BP. Discussed that once BP is consistently 120/80 we can wean her Losartan to '25mg'$ .   4) Bilateral lower extremity edema: - continue lymphedema therapy which is greatly helping.  -Lasix discontinued. Discussed that this should also improve her kidney function -advised low salt diet.  -prescribed new compression garments, will fax to company.  -discussed doing the compression garments early in the morning at 9am  -discussed that her kidney function is normal on last check in March.   5) Insomnia: -recommended tart cherry juice with dinner, valerian root and chamomile teas in evening, applying lavender drops to forehead at night. -recommended chamomile tea with saffron a couple hours before bedtime -continue  [name] '30mg'$  HS- discussed that we will not try to wean off again since we saw how much this is benefittng her -discontinue amantadine.  -discussed considering the Inspire trial since he has tried the CPAP for a long time now and can not tolerate it.   6) Obesity BMI 33.13 -attempted to wean off [name] but she developed worsening insomnia, anxiety, and decreased appetite that was undesirable to her, so have restarted the medication.  -discussed trial of avoiding gluten for 3 months and how this could potentially  help with her weight loss, anxiety, and prevention/slowing of her chronic diseases. -discussed benefits of intermittent fasting- made goal to set a consistent dinner time and avoid snacking after this time. If she is hungry, choose a healthy snack of nuts, fruit, or vegetables.  -provided a link to a pdf of Pete Escogue's musculoskeletal alignment exercises: [email or web address]   7) Chest congestion -referred for pulmonary consult with [name], who treated her father for pulmonary fibrosis. She has made an appointment to establish care with him.  -recommended drinking daily warm water with honey, lime and ginger -recommended drinking tea with holy Basil (Tulsi)  8) Sore throat: -continue salt water gargling  9) Sinus congestion: -recommended humidifier -drink 6-8 glasses of water per day.   10) fatigue -checked B12, folate, TSH, vitamin D, magnesium and discussed bloodwork with patient and husband: all in normal range except for suboptimal Vitamin D- high dose supplement prescribed -Recommended continuing NAC (N-Acetyl cysteine) '600mg'$  BID for her fatigue. Discussed its benefits in boosting glutathione and mitochondrial function. -prescribed monthly injectable B12 as patient would like to try this. Discussed that there are few risks to having elevated B12 levels -discussed that not wearing her CPAP is also  likely contributing to her fatigue -discussed modafinil but she prefers to try the B12 first   11) OSA -discussed that OSA can contribute to daytime fatigue, weight gain, anxiety, and chronic conditions so it is good to get this condition treated. -recommended follow-up sleep study for re-eval -referred to Dr. Melida Quitter for eval for [facility] given that she is having a hard time sleeping with her CPAP. Discussed her decision not to pursue Inspire at this time and instead try to tolerate her current mask as best as possible. Discussed her positive conversation with [name]. -advised her to talk to her respiratory therapist about fitting another mask -discussed Inspire but she says unfortunately it is not covered by insurance.  -discussed strap under the chin.  -discussed following up with pulmonology for Inspire.  -agreed with her decision to continue to try CPAP before pursuing Inspire due to her fears of the procedure  12) Low back pain: -recommended doTerra Deep Blue Essential oil and applied to area of pain today- discussed that this is made of natural plant oils. Shared by personal experience of benefit from use of this essential oil -provided a link to a pdf of Pete Escogue's musculoskeletal alignment exercises: [email or web address]   13) Lower extremity weakness: -discussed that this could be a possible side effect of the Risperdal.  -Recommended calling Dr. Garth Bigness office to let him know about the lower extremity weakness -discussed that she should go to the ED as unilateral lower extremity weakness could be a symptom of stroke or MS flare -discussed stopping Risperdal until she can talk with [name] -Discussed using Seroquel prn for anxiety in the meantime  14) Depression -discussed decreasing Zoloft dose back to '150mg'$  since this dose had been working well for her and since she has been experiencing suicidal ideations  with higher dose -discussed that suicidal ideations are common side effects of depression medications, that sometimes these medications can work effectively to treat depression but other times they give patients the energy to act on negative thoughts -encouraged that she call her kids to let them know how important it is to her to be able to see her grandchildren -discussed ECT with her caregiver  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 -recommended heat, range of motion, use of baclofen -will plan for 500U  Dysport next visit -recommended increasing Baclofen to '15mg'$  TID PRN  16) Frequent falls -discussed that her facility is planning to send her to her facility's SNF -discussed that she will need to have NFL2 form filled by her PCP -discussed that the rehab and exercise she gets there will also help with her depression  17) Nausea -refilled zofran  18) Suicidal thoughts -discussed that suicidal thoughts have decreased since decreasing Zoloft to '150mg'$  -discussed follow-up with behavioral health on Friday  16 minutes spent in discussion of her anxiety/depression, improvements since [name] started her on 0.'5mg'$  Lorazepam HS, her decreased suicidal thoughts since decreasing Zoloft to '150mg'$ , discussed follow-up with behavioral health

## 2022-08-19 ENCOUNTER — Other Ambulatory Visit: Payer: Self-pay | Admitting: Neurology

## 2022-08-19 ENCOUNTER — Other Ambulatory Visit: Payer: Self-pay

## 2022-08-19 DIAGNOSIS — M6281 Muscle weakness (generalized): Secondary | ICD-10-CM | POA: Diagnosis not present

## 2022-08-19 DIAGNOSIS — G35 Multiple sclerosis: Secondary | ICD-10-CM | POA: Diagnosis not present

## 2022-08-19 DIAGNOSIS — R262 Difficulty in walking, not elsewhere classified: Secondary | ICD-10-CM | POA: Diagnosis not present

## 2022-08-19 DIAGNOSIS — R2681 Unsteadiness on feet: Secondary | ICD-10-CM | POA: Diagnosis not present

## 2022-08-19 DIAGNOSIS — R41841 Cognitive communication deficit: Secondary | ICD-10-CM | POA: Diagnosis not present

## 2022-08-19 DIAGNOSIS — R279 Unspecified lack of coordination: Secondary | ICD-10-CM | POA: Diagnosis not present

## 2022-08-20 ENCOUNTER — Telehealth: Payer: Self-pay

## 2022-08-20 DIAGNOSIS — R2681 Unsteadiness on feet: Secondary | ICD-10-CM | POA: Diagnosis not present

## 2022-08-20 DIAGNOSIS — M6281 Muscle weakness (generalized): Secondary | ICD-10-CM | POA: Diagnosis not present

## 2022-08-20 DIAGNOSIS — R279 Unspecified lack of coordination: Secondary | ICD-10-CM | POA: Diagnosis not present

## 2022-08-20 DIAGNOSIS — R41841 Cognitive communication deficit: Secondary | ICD-10-CM | POA: Diagnosis not present

## 2022-08-20 DIAGNOSIS — R262 Difficulty in walking, not elsewhere classified: Secondary | ICD-10-CM | POA: Diagnosis not present

## 2022-08-20 DIAGNOSIS — G35 Multiple sclerosis: Secondary | ICD-10-CM | POA: Diagnosis not present

## 2022-08-20 NOTE — Telephone Encounter (Signed)
Carly [name] Carly [name] - Rx #: [id number] Need help? Call us at [phone] Status Sent to [facility] Ondansetron HCl '4MG'$  tablets Form [employer] Electronic [place] Form [phone] NCPDP) Original Claim Info [place] [facility] CALL [phone]

## 2022-08-21 ENCOUNTER — Encounter (HOSPITAL_BASED_OUTPATIENT_CLINIC_OR_DEPARTMENT_OTHER): Payer: Medicare Other | Admitting: Physical Medicine and Rehabilitation

## 2022-08-21 DIAGNOSIS — F32A Depression, unspecified: Secondary | ICD-10-CM | POA: Diagnosis not present

## 2022-08-21 DIAGNOSIS — G35 Multiple sclerosis: Secondary | ICD-10-CM | POA: Diagnosis not present

## 2022-08-21 DIAGNOSIS — M6281 Muscle weakness (generalized): Secondary | ICD-10-CM | POA: Diagnosis not present

## 2022-08-21 DIAGNOSIS — R279 Unspecified lack of coordination: Secondary | ICD-10-CM | POA: Diagnosis not present

## 2022-08-21 DIAGNOSIS — R2681 Unsteadiness on feet: Secondary | ICD-10-CM | POA: Diagnosis not present

## 2022-08-21 DIAGNOSIS — R41841 Cognitive communication deficit: Secondary | ICD-10-CM | POA: Diagnosis not present

## 2022-08-21 DIAGNOSIS — R262 Difficulty in walking, not elsewhere classified: Secondary | ICD-10-CM | POA: Diagnosis not present

## 2022-08-21 NOTE — Progress Notes (Signed)
Subjective:    Patient ID: Carly [name], female    DOB: [birth year], 71 y.o.   MRN: [id number]  HPI  An audio/video tele-health visit is felt to be the most appropriate encounter for this patient at this time. This is a follow up tele-visit via phone. The patient is at home. MD is at office. Prior to scheduling this appointment, our staff discussed the limitations of evaluation and management by telemedicine and the availability of in-person appointments. The patient expressed understanding and agreed to proceed.    Carly [name] is a 71 year old woman who presents for f/u of white matter periventricular CVA, presents for follow-up regarding lymphedema, anxiety,chronic fatigue, insomnia, OSA, and weight gain, and new lower extremity weakness today.    1) Anxiety:  -improved since Carly [name] started her on Lorazepam .'5mg'$  at night -she asks what else she can do for this.  -she says that psychiatry denied her referral and stated that it was because she had dementia. She notes that she has never ben diagnosed with dementia.  -her psychiatrist previously transitioned her from Seroquel back to benzodiazepine due to side effect profile of Seroquel, but this worsened her anxiety so we restarted it. She asks if she can increase Seroquel to '25mg'$  -she needs a new order for Seroquel as her current one only accounts for nighttime dose but she is also taking one prn during the day.  -she is currently a little out of it from the anesthesia for her recent colonoscopy -anxiety has been very severe -she started Zoloft this week and has not yet noted any benefit, she is a little discouraged as felt this should take effect sooner.  -she would like referral to Carly [name] who was recommended to her by Carly [name], she has not yet heard from their office -she really likes following with Carly [name] as well -she feels that her inability to see her grandkids is a trigger for her anxiety. She used to see  them mmore in the past and does not know why she doesn;t get to any mores -she does not want to restart her [name] as she does not want the weight gain associated with this medication -she has only been sleeping 4 hours per night and she feels this worsens her anxiety.  -she has been having a lot of anxiety related to her and her husband's move from their current home into a retirement home and she asks whether she may take the seroquel additionally as needed- once in the morning, once later in the day, and two at night -she is interested in following with a behavioral therapist -she and husband Carly [name] were scared of side effects of Seroquel that were discussed- she has not noted any this far.  -she has been sleeping but her legs feel very stiff at night -she continues to take Cymbalta '60mg'$  -we tried weaning her off the mirtazepine but she experienced weakness and insomnia and wanted to restart- titrated back up to her former dose.  -she discussed with her husband, who is also present on this call today, and she would prefer to restart Seroquel and stop Lorazepam.  -she has not had any counseling and would like to restart this. She has an appointment with Carly [name] in August and would like to start counseling earlier than this if possible.  -she loved speaking with Carly [name] inpatient and would like to follow with him -she had symptoms of dizziness yesterday at the hairdresser and was asked to  contact me regarding if this could be from the seroquel. She had had no other similar episodes since starting it.  -she has been sleeping well with the Seroquel at night.  -she would like a list of foods that can help anxiety.  -she asks about the dietary advice I gave her last visit regarding anxiety. -she has been using essential oil.  -anxiety has been bad -off prednisone now -exercise helps her.  -she gets upset over things easily -she wants to be able to calm down.  -she needs refills of  her cymbalta and [name]  2) MS: -her spasticity improved since Carly [name] started her on 0.'5mg'$  Lorazepam.  -has been having a lot of stiffness recently, we had discssed increasing the baclofen and this does appear to have helped -EMS called twice due to difficulty getting her up off the floor -is receiving home therapy -she would like to see a different neurologist and asked for my recommendation   3) Lymphedema  -has not been using garment recently -has significant bilateral lower extremity  -has greatly improved with lymphedema therapy -she recently received a device that has helped so much that she was able to get off of Lasix.  -has ordered compression garments -does not eat processed foods or salt.  -she has ordered something from [employer] to give her lymphatic massage.  -improving but still very bothersome to her -discussed bloodwork today  4) HTN: -BP 138/79  5) Decreased appetite:  -resolved with increasing [name]  6) She developed pain in right side, and not sure if this is due to topamax or the antibiotics she is on to treat a respiratory infection.   7) Insomnia:  -sleeping poorly due to her CPAP mask, pressure is [name] at night and this is causing mask leak. -sleeping about 5 hours per night and has been referred for another sleep study  -respiratory therapist will seeing her tomorrow.  -she asks about Inspire.  -she follows with Carly [name] and she says he is also a sleep doctor -she hates to reduce the medicine.  -she has tried 5 different masks -the machine is very loud.  -she can sleep without the CPAP fine.   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 She received a call to schedule an appointment with Carly [name] office and is thankful for the referral.  -she asks what else  she can do to help with this -she has been through multiple rounds of antibiotics without benefit.  -also feeling diffuse joint aches.   9) GERD -has been experiencing after eating  10) Sinus congestion -pleased that her CT results were normal.   11) OSA -she notes that she was found to have OSA on recent sleep study and she was tearful about this as she feels it is just another condition she has -sleeping 5 hours per night and has been referred for another sleep study -she was surprised as she feels that she sleeps well.  -she has note been sleeping well while using the CPAP mask and was told that O2 is leaking -she is working with a respiratory therapist -she feels anxiety about whether she will get enough sleep tonight, she has only been sleeping about 4 hours since using the CPAP -she has decided she does not want to pursue Inspire yet until she has learned more about it.  -  she had a very productive visit with Carly [name] and he advised her to try her best to tolerate the CPAP mask, which she is doing.  -followed up with pulmonology regarding [name] and was told she would be a good candidate for this treatment, but she should prefer to continue trying CPAP mask before trying Inspire  12) Chronic fatigue -she was diagnosed with COVID 9 days ago and her breathing has improved but she has been feeling very fatigued -she has been taking the N-acetyl-cysteine supplement -she asks about taking injetable B12 as this really helped her son -she has not been wearing her CPAP as she is waiting for a new mask  13) Pain -takes Tylenol and baclofen -would like to get Dysport next available appointment  14) Lower extremity weakness: -improved -went to ED and there was no evidence of stroke. Thought to be due to her Risperdal -her legs feel stiff -no other focal deficits -she started Risperdal on Wednesday night and asks if this could be causing her weakness -Carly [name] has been trying to  wean her off Mirtazapine and Seroquel due to weight gain and onto Risperdal for her anxiety  15) Bilateral lower extremity pain -feels stiff and tight like spasticity -she asks if she can take extra Baclofen  16) depression --she has been doing better on the '150mg'$  Zoloft -there is another medication she would also like to try -she has been having a hard time getting in touch with her psychiatrist regarding the suicidal ideation she has been experiencing  17) frequent falls -she says she is being admitted to sNF due to recent frequent falls. -she has not had any more falls since admission  18) Nausea -needs refill of Zofran  Pain Inventory Average Pain 5 Pain Right Now 2 My pain is aching and no pain.  Sometimes spasm in legs at night because of MST  In the last 24 hours, has pain interfered with the following? General activity 4 Relation with others 7 Enjoyment of life 5  What TIME of day is your pain at its worst? night Sleep (in general) Fair  Pain is worse with: sitting and unsure Pain improves with: medication Relief from Meds: 5  Family History  Problem Relation Age of Onset   Arthritis Mother    Hypertension Mother    Macular degeneration Mother    Hypertension Father    Hyperlipidemia Father    Heart disease Maternal Grandfather    Diabetes Maternal Grandfather    Kidney disease Paternal Grandmother    Social History   Socioeconomic History   Marital status: Married    Spouse name: Engineer, technical sales   Number of children: 3   Years of education: 14   Highest education level: Associate degree: academic program  Occupational History   Not on file  Tobacco Use   Smoking status: Never   Smokeless tobacco: Never  Vaping Use   Vaping Use: Never used  Substance and Sexual Activity   Alcohol use: No    Alcohol/week: 0.0 standard drinks of alcohol   Drug use: No   Sexual activity: Not Currently  Other Topics Concern   Not on file  Social History Narrative   Lives  with husband    Right handed   Caffeine: 1 cup of coffee in the AM, coke occa. Trying to come off caffeine    Social Determinants of Health   Financial Resource Strain: Low Risk  (01/07/2022)   Overall Financial Resource Strain (CARDIA)    Difficulty  of Paying Living Expenses: Not hard at all  Food Insecurity: No Food Insecurity (01/07/2022)   Hunger Vital Sign    Worried About Running Out of Food in the Last Year: Never true    Ran Out of Food in the Last Year: Never true  Transportation Needs: No Transportation Needs (01/07/2022)   PRAPARE - Hydrologist (Medical): No    Lack of Transportation (Non-Medical): No  Physical Activity: Insufficiently Active (01/07/2022)   Exercise Vital Sign    Days of Exercise per Week: 2 days    Minutes of Exercise per Session: 60 min  Stress: No Stress Concern Present (01/07/2022)   [place]    Feeling of Stress : Not at all  Social Connections: Unknown (01/07/2022)   Social Connection and Isolation Panel [NHANES]    Frequency of Communication with Friends and Family: Not on file    Frequency of Social Gatherings with Friends and Family: Not on file    Attends Religious Services: Not on file    Active Member of Clubs or Organizations: Not on file    Attends Archivist Meetings: Not on file    Marital Status: Married   Past Surgical History:  Procedure Laterality Date   BACK SURGERY     CHOLECYSTECTOMY     COLONOSCOPY N/A 04/23/2022   Procedure: COLONOSCOPY;  Surgeon: Toledo, Benay Pike, MD;  Location: [facility] ENDOSCOPY;  Service: Gastroenterology;  Laterality: N/A;   COLONOSCOPY WITH PROPOFOL N/A 05/18/2017   Procedure: COLONOSCOPY WITH PROPOFOL;  Surgeon: [name], MD;  Location: [facility] ENDOSCOPY;  Service: Endoscopy;  Laterality: N/A;   FOOT SURGERY  2015   GALLBLADDER SURGERY  2008   HARDWARE REMOVAL Left 02/14/2016   Procedure: LEFT FOOT  REMOVAL DEEP IMPLANT;  Surgeon: [name], MD;  Location: [facility];  Service: Orthopedics;  Laterality: Left;   HERNIA REPAIR     Inguinal Hernia Repair   SPINE SURGERY  2014   Past Surgical History:  Procedure Laterality Date   BACK SURGERY     CHOLECYSTECTOMY     COLONOSCOPY N/A 04/23/2022   Procedure: COLONOSCOPY;  Surgeon: Toledo, Benay Pike, MD;  Location: [facility] ENDOSCOPY;  Service: Gastroenterology;  Laterality: N/A;   COLONOSCOPY WITH PROPOFOL N/A 05/18/2017   Procedure: COLONOSCOPY WITH PROPOFOL;  Surgeon: [name], MD;  Location: [facility] ENDOSCOPY;  Service: Endoscopy;  Laterality: N/A;   FOOT SURGERY  2015   GALLBLADDER SURGERY  2008   HARDWARE REMOVAL Left 02/14/2016   Procedure: LEFT FOOT REMOVAL DEEP IMPLANT;  Surgeon: [name], MD;  Location: [place];  Service: Orthopedics;  Laterality: Left;   HERNIA REPAIR     Inguinal Hernia Repair   SPINE SURGERY  2014   Past Medical History:  Diagnosis Date   Allergy    Anxiety    Aspiration pneumonia (HCC)    Depression    Frequent headaches    H/O   GERD (gastroesophageal reflux disease)    History of chicken pox    History of colon polyps    Hx of migraines    Hypertension    Lymphedema    Multiple sclerosis ([place]) 2011   OSA (obstructive sleep apnea)    PONV (postoperative nausea and vomiting)    Scoliosis    There were no vitals taken for this visit.  Opioid Risk Score:   Fall Risk Score:  `1  Depression screen  PHQ 2/9     05/22/2022    4:07 PM 04/09/2022   10:00 AM 01/07/2022    3:15 PM 11/05/2021   10:10 AM 08/27/2021   11:12 AM 08/15/2021   10:50 AM 07/19/2021    3:28 PM  Depression screen PHQ 2/9  Decreased Interest 3 1 0 0 2 0 1  Down, Depressed, Hopeless 3 1 0 1 2 0 1  PHQ - 2 Score 6 2 0 1 4 0 2  Altered sleeping 3 0       Tired, decreased energy 3 1       Change in appetite 3 0       Feeling bad or failure about yourself  3 0       Trouble concentrating 3 0        Moving slowly or fidgety/restless 0 0       Suicidal thoughts 1 0       PHQ-9 Score 22 3       Difficult doing work/chores Somewhat difficult Not difficult at all         Review of Systems  Constitutional: Negative.   HENT: Negative.    Eyes: Negative.   Respiratory:  Negative for cough.        Chest congestion  Cardiovascular: Negative.   Gastrointestinal: Negative.   Endocrine: Negative.   Genitourinary: Negative.   Musculoskeletal:  Positive for back pain and gait problem.  Skin: Negative.   Allergic/Immunologic: Negative.   Hematological:  Bruises/bleeds easily.       Plavix  Psychiatric/Behavioral:  Positive for dysphoric mood.   All other systems reviewed and are negative.      Objective:   Physical Exam Patient seen via phone     Assessment & Plan:  1) Anxiety: -discussed with patient the side effects of both Seroquel and Lorazepam, as well as potential interactions with other medications. -sent new referral to psychiatry and indicated on referral that patient has not been diagnosed with dementia -discussed improvements since Carly [name] started her on 0.'5mg'$  Lorazepam at night -recommended drinking chamomile tea with saffron every evening -advised that saffron can be purchased from the spices section of her grocery section and just 1 strand daily can be effective if mood elevation, and it is very good for health.  -recommended increasing Seroquel to '25mg'$  HS and adding an additional '25mg'$  during the day that can be taken once daily or split in half to be taken as 12.'5mg'$  BID PRN.  -discussed referral to psychiatry, placed referral to Carly [name] who was recommended to her by Carly [name], discussed that it may be at least a week before she hears from them. -continue Zoloft, discussed that this will take 2-4 weeks to have effect, reinforced that she may not feel benefit from this yet. Discussed that not everyone response to every medicine and there are alternative  options we can try if this is not effective enough.  -discussed benefits of exercise and good nutrition -discussed her move because it is stressing her.  -discussed whether we should increase Seroquel due to her anxiety. Discussed that she is currently taking 12.'5mg'$  3 times per day PRN -discussed about her stress of seeing many different doctors -advised to always use the lowest dose of medications that she needs to minimize side effects -discussed the buspar that was started for her by Carly [name]- she has not yet noted benefits from this, advised that she does not need to take it if she does not  feel benefits.  -referred to psychiatry in [place] for CBT -reinforced importance of thinking positively.  -continue to encourage non-pharmacologic management approaches as well, such as meditation, applying lavender oil, and exercise- all of which she has been trying and which have been helping.  -discussed alternative anxiety medications.  -continue seroquel 12.'5mg'$  daily and '25mg'$  at night. Can decreased to '25mg'$  at night and assess positive/negative effects during the day. Can restart 1/2 tab during the day if anxiety is uncontrolled.  -discontinue lorazepam.  -establish care with Carly [name] for neuropsych counseling on 8/2. They really valued the appointment with him.  -discontinue topamax in case pain in right side is due to medication- discussed can also be due to antibiotics which she is taking for respiratory infection- discontinuing the topamax will help [place] know if it is the cause of her pain or not. -refilled [name] '30mg'$  HS.  -refilled Cymbalta '60mg'$  -Discussed exercise and meditation as tools to decrease anxiety. -Recommended Down Dog Yoga app -Discussed spending time outdoors. -Discussed positive re-framing of anxiety.  -Discussed the following foods that have been show to reduce anxiety: 1) [place] nuts, mushrooms, soy beans due to their high selenium content. Upper limit of  toxicity of selenium is 457mg/day so no more than 3-4 bBolivianuts per day.  2) Fatty fish such as salmon, mackerel, sardines, trout, and herring- high in omega-3 fatty acids 3) Eggs- increases serotonin and dopamine 4) Pumpkin seeds- high in omega-3 fatty acids 5) dark chocolate- high in flavanols that increase blood flow to brain 6) turmeric- take with black pepper to increase absorption 7) chamomile tea- antioxidant and anti-inflammatory properties 8) yogurt without sugar- supports gut-brain axis 9) green tea- contains L- theanine 10) blueberries- high in vitamin C and antioxidants 11) t[place] high in tryptophan which gets converted to serotonin 12) bell peppers- rich in vitamin C and antioxidants 13) citrus fruits- rich in vitamin C and antioxidants 14) almonds- high in vitamin E and healthy fats 15) chia seeds- high in omega-3 fatty acids  2) MS -discussed improvements in anxiety since she was started on 0.'5mg'$  Lorazepam nightly  -continue home therapy to minimize stiffness/maximize mobility -f/u with MS specialist. Discussed her appointmenr with Dr. SKerman [name]  -provided with neurology referral -discussed trying lipoic acid  3) HTN:  -discussed good blood pressure previously BP has been 130s-150s/80s, recommended citrus foods and nuts, as much mobility as she can tolerate, log Bps daily and bring log to f/u appointment.  -continue current regimen and checking and checking BP.  -advised citrus foods and nuts to help lower BP. Discussed that once BP is consistently 120/80 we can wean her Losartan to '25mg'$ .   4) Bilateral lower extremity edema: - continue lymphedema therapy which is greatly helping.  -Lasix discontinued. Discussed that this should also improve her kidney function -advised low salt diet.  -prescribed new compression garments, will fax to company.  -discussed doing the compression garments early in the morning at 9am  -discussed that her kidney function is normal on  last check in March.   5) Insomnia: -recommended tart cherry juice with dinner, valerian root and chamomile teas in evening, applying lavender drops to forehead at night. -recommended chamomile tea with saffron a couple hours before bedtime -continue [name] '30mg'$  HS- discussed that we will not try to wean off again since we saw how much this is benefittng her -discontinue amantadine.  -discussed considering the Inspire trial since he has tried the CPAP for a long time now and can not tolerate  it.   6) Obesity BMI 33.13 -attempted to wean off [name] but she developed worsening insomnia, anxiety, and decreased appetite that was undesirable to her, so have restarted the medication.  -discussed trial of avoiding gluten for 3 months and how this could potentially help with her weight loss, anxiety, and prevention/slowing of her chronic diseases. -discussed benefits of intermittent fasting- made goal to set a consistent dinner time and avoid snacking after this time. If she is hungry, choose a healthy snack of nuts, fruit, or vegetables.  -provided a link to a pdf of Pete Escogue's musculoskeletal alignment exercises: [email or web address]   7) Chest congestion -referred for pulmonary consult with Carly [name], who treated her father for pulmonary fibrosis. She has made an appointment to establish care with him.  -recommended drinking daily warm water with honey, lime and ginger -recommended drinking tea with holy Basil (Tulsi)  8) Sore throat: -continue salt water gargling  9) Sinus congestion: -recommended humidifier -drink 6-8 glasses of water per day.   10) fatigue -checked B12, folate, TSH, vitamin D, magnesium and discussed bloodwork with patient and husband: all in normal range except for suboptimal Vitamin D- high dose supplement prescribed -Recommended continuing NAC (N-Acetyl cysteine) '600mg'$  BID for her fatigue.  Discussed its benefits in boosting glutathione and mitochondrial function. -prescribed monthly injectable B12 as patient would like to try this. Discussed that there are few risks to having elevated B12 levels -discussed that not wearing her CPAP is also likely contributing to her fatigue -discussed modafinil but she prefers to try the B12 first   11) OSA -discussed that OSA can contribute to daytime fatigue, weight gain, anxiety, and chronic conditions so it is good to get this condition treated. -recommended follow-up sleep study for re-eval -referred to Dr. Melida Quitter for eval for [facility] given that she is having a hard time sleeping with her CPAP. Discussed her decision not to pursue Inspire at this time and instead try to tolerate her current mask as best as possible. Discussed her positive conversation with Carly [name]. -advised her to talk to her respiratory therapist about fitting another mask -discussed Inspire but she says unfortunately it is not covered by insurance.  -discussed strap under the chin.  -discussed following up with pulmonology for Inspire.  -agreed with her decision to continue to try CPAP before pursuing Inspire due to her fears of the procedure  12) Low back pain: -recommended doTerra Deep Blue Essential oil and applied to area of pain today- discussed that this is made of natural plant oils. Shared by personal experience of benefit from use of this essential oil -provided a link to a pdf of Pete Escogue's musculoskeletal alignment exercises: [email or web address]   13) Lower extremity weakness: -discussed that this could be a possible side effect of the Risperdal.  -Recommended calling Dr. Garth Bigness office to let him know about the lower extremity weakness -discussed that she should go to the ED as unilateral lower extremity weakness could be a symptom of stroke or MS flare -discussed stopping Risperdal until  she can talk with Carly [name] -Discussed using Seroquel prn for anxiety in the meantime  14) Depression -discussed decreasing Zoloft dose back to '150mg'$  since this dose had been working well for her and since she has been experiencing suicidal ideations with higher dose -discussed that suicidal ideations are common side effects of depression medications, that sometimes these medications can work effectively to treat depression but other times they give patients the energy to act on negative thoughts -  encouraged that she call her kids to let them know how important it is to her to be able to see her grandchildren -discussed ECT with her caregiver  48) Lower extremity spasticity -recommended heat, range of motion, use of baclofen -will plan for 500U Dysport next visit -recommended increasing Baclofen to '15mg'$  TID PRN  16) Frequent falls -discussed that her facility is planning to send her to her facility's SNF -discussed that she will need to have NFL2 form filled by her PCP -discussed that the rehab and exercise she gets there will also help with her depression  17) Nausea -refilled zofran  18) Suicidal thoughts -discussed that suicidal thoughts have decreased since decreasing Zoloft to '150mg'$  -discussed follow-up with behavioral health on Friday  5 minutes spent in discussion of her psychiatry referral being denied because psychiatry office said she had a diagnosis of dementia, sent new referral saying this was not the case

## 2022-08-22 ENCOUNTER — Ambulatory Visit (HOSPITAL_COMMUNITY): Payer: Medicare Other | Admitting: Clinical

## 2022-08-22 ENCOUNTER — Encounter: Payer: Self-pay | Admitting: Hematology and Oncology

## 2022-08-22 NOTE — Telephone Encounter (Signed)
Anzlee Ridlon [employer] [place] - PA Case ID: [id number] - Rx #: [id number] Need help? Call us at [phone] Outcome Deniedon November 9 Your PA request has been denied. Additional information will be provided in the denial communication. (Message 1140) Drug Ondansetron HCl '4MG'$  tablets

## 2022-08-25 DIAGNOSIS — R41841 Cognitive communication deficit: Secondary | ICD-10-CM | POA: Diagnosis not present

## 2022-08-25 DIAGNOSIS — R262 Difficulty in walking, not elsewhere classified: Secondary | ICD-10-CM | POA: Diagnosis not present

## 2022-08-25 DIAGNOSIS — M6281 Muscle weakness (generalized): Secondary | ICD-10-CM | POA: Diagnosis not present

## 2022-08-25 DIAGNOSIS — G35 Multiple sclerosis: Secondary | ICD-10-CM | POA: Diagnosis not present

## 2022-08-25 DIAGNOSIS — R279 Unspecified lack of coordination: Secondary | ICD-10-CM | POA: Diagnosis not present

## 2022-08-25 DIAGNOSIS — R2681 Unsteadiness on feet: Secondary | ICD-10-CM | POA: Diagnosis not present

## 2022-08-26 ENCOUNTER — Encounter (HOSPITAL_BASED_OUTPATIENT_CLINIC_OR_DEPARTMENT_OTHER): Payer: Medicare Other | Admitting: Physical Medicine and Rehabilitation

## 2022-08-26 DIAGNOSIS — R41841 Cognitive communication deficit: Secondary | ICD-10-CM | POA: Diagnosis not present

## 2022-08-26 DIAGNOSIS — R262 Difficulty in walking, not elsewhere classified: Secondary | ICD-10-CM | POA: Diagnosis not present

## 2022-08-26 DIAGNOSIS — G35 Multiple sclerosis: Secondary | ICD-10-CM | POA: Diagnosis not present

## 2022-08-26 DIAGNOSIS — R2681 Unsteadiness on feet: Secondary | ICD-10-CM | POA: Diagnosis not present

## 2022-08-26 DIAGNOSIS — M6281 Muscle weakness (generalized): Secondary | ICD-10-CM | POA: Diagnosis not present

## 2022-08-26 DIAGNOSIS — F411 Generalized anxiety disorder: Secondary | ICD-10-CM | POA: Diagnosis not present

## 2022-08-26 DIAGNOSIS — R279 Unspecified lack of coordination: Secondary | ICD-10-CM | POA: Diagnosis not present

## 2022-08-26 NOTE — Progress Notes (Signed)
Subjective:    Patient ID: Carly [name], female    DOB: Oct 01, 1951, 71 y.o.   MRN: [id number]  HPI  An audio/video tele-health visit is felt to be the most appropriate encounter for this patient at this time. This is a follow up tele-visit via phone. The patient is at home. MD is at office. Prior to scheduling this appointment, our staff discussed the limitations of evaluation and management by telemedicine and the availability of in-person appointments. The patient expressed understanding and agreed to proceed.    Carly [name] is a 71 year old woman who presents for f/u of white matter periventricular CVA, presents for follow-up regarding lymphedema, anxiety,chronic fatigue, insomnia, OSA, and weight gain, and new lower extremity weakness today.    1) Anxiety:  -improved since Dr. Kerman Passey started her on Lorazepam .'5mg'$  at night, she continues to get benefit from this -she asks what else she can do for this.  [name] is trying to help her to stay more active -discussed that she is doing better with the Zoloft at '150mg'$   -discussed that she was displeased that her behavioral health provider on Friday was female, she would prefer to talk with a female -she let the company know this and they do ot have any availability with female providers for 6 months -she says that psychiatry denied her referral and stated that it was because she had dementia. She notes that she has never ben diagnosed with dementia.  -her psychiatrist previously transitioned her from Seroquel back to benzodiazepine due to side effect profile of Seroquel, but this worsened her anxiety so we restarted it. She asks if she can increase Seroquel to '25mg'$  -she needs a new order for Seroquel as her current one only accounts for nighttime dose but she is also taking one prn during the day.  -she is currently a little out of it from the anesthesia for her recent colonoscopy -anxiety has been very severe -she started Zoloft this week  and has not yet noted any benefit, she is a little discouraged as felt this should take effect sooner.  -she would like referral to Dr. Sonny Dandy who was recommended to her by [name], she has not yet heard from their office -she really likes following with [name] as well -she feels that her inability to see her grandkids is a trigger for her anxiety. She used to see them mmore in the past and does not know why she doesn;t get to any mores -she does not want to restart her [name] as she does not want the weight gain associated with this medication -she has only been sleeping 4 hours per night and she feels this worsens her anxiety.  -she has been having a lot of anxiety related to her and her husband's move from their current home into a retirement home and she asks whether she may take the seroquel additionally as needed- once in the morning, once later in the day, and two at night -she is interested in following with a behavioral therapist -she and husband [name] were scared of side effects of Seroquel that were discussed- she has not noted any this far.  -she has been sleeping but her legs feel very stiff at night -she continues to take Cymbalta '60mg'$  -we tried weaning her off the mirtazepine but she experienced weakness and insomnia and wanted to restart- titrated back up to her former dose.  -she discussed with her husband, who is also present on this call today, and  she would prefer to restart Seroquel and stop Lorazepam.  -she has not had any counseling and would like to restart this. She has an appointment with [name] in August and would like to start counseling earlier than this if possible.  -she loved speaking with [name] inpatient and would like to follow with him -she had symptoms of dizziness yesterday at the hairdresser and was asked to contact me regarding if this could be from the seroquel. She had had no other similar episodes since starting it.  -she  has been sleeping well with the Seroquel at night.  -she would like a list of foods that can help anxiety.  -she asks about the dietary advice I gave her last visit regarding anxiety. -she has been using essential oil.  -anxiety has been bad -off prednisone now -exercise helps her.  -she gets upset over things easily -she wants to be able to calm down.  -she needs refills of her cymbalta and [name]  2) MS: -her spasticity improved since Dr. Jennye Moccasin started her on 0.'5mg'$  Lorazepam.  -has been having a lot of stiffness recently, we had discssed increasing the baclofen and this does appear to have helped -EMS called twice due to difficulty getting her up off the floor -is receiving home therapy -she would like to see a different neurologist and asked for my recommendation   3) Lymphedema  -has not been using garment recently -has significant bilateral lower extremity  -has greatly improved with lymphedema therapy -she recently received a device that has helped so much that she was able to get off of Lasix.  -has ordered compression garments -does not eat processed foods or salt.  -she has ordered something from [employer] to give her lymphatic massage.  -improving but still very bothersome to her -discussed bloodwork today  4) HTN: -BP 138/79  5) Decreased appetite:  -resolved with increasing [name]  6) She developed pain in right side, and not sure if this is due to topamax or the antibiotics she is on to treat a respiratory infection.   7) Insomnia:  -sleeping poorly due to her CPAP mask, pressure is rising at night and this is causing mask leak. -sleeping about 5 hours per night and has been referred for another sleep study  -respiratory therapist will seeing her tomorrow.  -she asks about Inspire.  -she follows with [name] and she says he is also a sleep doctor -she hates to reduce the medicine.  -she has tried 5 different masks -the machine is very loud.  -she  can sleep without the CPAP fine.   8) Chest congestion -she has been having symptoms of congestion for greater than 3 months -Her father saw 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 She received a call to schedule an appointment with [name] office and is thankful for the referral.  -she asks what else she can do to help with this -she has been through multiple rounds of antibiotics without benefit.  -also feeling diffuse joint aches.   9) GERD -has been experiencing after eating  10) Sinus congestion -pleased that her CT results were normal.   11) OSA -she notes that she was found to have OSA on recent sleep study and she was tearful about this as she feels it is just another condition she has -sleeping 5 hours per night and has been referred for another sleep study -she was surprised as she feels that  she sleeps well.  -she has note been sleeping well while using the CPAP mask and was told that O2 is leaking -she is working with a respiratory therapist -she feels anxiety about whether she will get enough sleep tonight, she has only been sleeping about 4 hours since using the CPAP -she has decided she does not want to pursue Inspire yet until she has learned more about it.  -she had a very productive visit with [name] and he advised her to try her best to tolerate the CPAP mask, which she is doing.  -followed up with pulmonology regarding [name] and was told she would be a good candidate for this treatment, but she should prefer to continue trying CPAP mask before trying Inspire  12) Chronic fatigue -she was diagnosed with COVID 9 days ago and her breathing has improved but she has been feeling very fatigued -she has been taking the N-acetyl-cysteine supplement -she asks about taking injetable B12 as this really helped her son -she has not been wearing her CPAP as she is waiting for a new mask  13)  Pain -takes Tylenol and baclofen -would like to get Dysport next available appointment  14) Lower extremity weakness: -improved -went to ED and there was no evidence of stroke. Thought to be due to her Risperdal -her legs feel stiff -no other focal deficits -she started Risperdal on Wednesday night and asks if this could be causing her weakness -[name] has been trying to wean her off Mirtazapine and Seroquel due to weight gain and onto Risperdal for her anxiety  15) Bilateral lower extremity pain -feels stiff and tight like spasticity -she asks if she can take extra Baclofen  16) depression --she has been doing better on the '150mg'$  Zoloft -there is another medication she would also like to try -she has been having a hard time getting in touch with her psychiatrist regarding the suicidal ideation she has been experiencing  17) frequent falls -she says she is being admitted to [facility] due to recent frequent falls. -she has not had any more falls since admission  18) Nausea -needs refill of Zofran  Pain Inventory Average Pain 5 Pain Right Now 2 My pain is aching and no pain.  Sometimes spasm in legs at night because of MST  In the last 24 hours, has pain interfered with the following? General activity 4 Relation with others 7 Enjoyment of life 5  What TIME of day is your pain at its worst? night Sleep (in general) Fair  Pain is worse with: sitting and unsure Pain improves with: medication Relief from Meds: 5  Family History  Problem Relation Age of Onset   Arthritis Mother    Hypertension Mother    Macular degeneration Mother    Hypertension Father    Hyperlipidemia Father    Heart disease Maternal Grandfather    Diabetes Maternal Grandfather    Kidney disease Paternal Grandmother    Social History   Socioeconomic History   Marital status: Married    Spouse name: Engineer, technical sales   Number of children: 3   Years of education: 14   Highest education level: Associate  degree: academic program  Occupational History   Not on file  Tobacco Use   Smoking status: Never   Smokeless tobacco: Never  Vaping Use   Vaping Use: Never used  Substance and Sexual Activity   Alcohol use: No    Alcohol/week: 0.0 standard drinks of alcohol   Drug use: No  Sexual activity: Not Currently  Other Topics Concern   Not on file  Social History Narrative   Lives with husband    Right handed   Caffeine: 1 cup of coffee in the AM, coke occa. Trying to come off caffeine    Social Determinants of Health   Financial Resource Strain: Low Risk  (01/07/2022)   Overall Financial Resource Strain (CARDIA)    Difficulty of Paying Living Expenses: Not hard at all  Food Insecurity: No Food Insecurity (01/07/2022)   Hunger Vital Sign    Worried About Running Out of Food in the Last Year: Never true    Ran Out of Food in the Last Year: Never true  Transportation Needs: No Transportation Needs (01/07/2022)   PRAPARE - Hydrologist (Medical): No    Lack of Transportation (Non-Medical): No  Physical Activity: Insufficiently Active (01/07/2022)   Exercise Vital Sign    Days of Exercise per Week: 2 days    Minutes of Exercise per Session: 60 min  Stress: No Stress Concern Present (01/07/2022)   [place]    Feeling of Stress : Not at all  Social Connections: Unknown (01/07/2022)   Social Connection and Isolation Panel [NHANES]    Frequency of Communication with Friends and Family: Not on file    Frequency of Social Gatherings with Friends and Family: Not on file    Attends Religious Services: Not on file    Active Member of Clubs or Organizations: Not on file    Attends Archivist Meetings: Not on file    Marital Status: Married   Past Surgical History:  Procedure Laterality Date   BACK SURGERY     CHOLECYSTECTOMY     COLONOSCOPY N/A 04/23/2022   Procedure: COLONOSCOPY;   Surgeon: Toledo, Benay Pike, MD;  Location: [facility] ENDOSCOPY;  Service: Gastroenterology;  Laterality: N/A;   COLONOSCOPY WITH PROPOFOL N/A 05/18/2017   Procedure: COLONOSCOPY WITH PROPOFOL;  Surgeon: [name], MD;  Location: [facility] ENDOSCOPY;  Service: Endoscopy;  Laterality: N/A;   FOOT SURGERY  2015   GALLBLADDER SURGERY  2008   HARDWARE REMOVAL Left 02/14/2016   Procedure: LEFT FOOT REMOVAL DEEP IMPLANT;  Surgeon: [name], MD;  Location: [place];  Service: Orthopedics;  Laterality: Left;   HERNIA REPAIR     Inguinal Hernia Repair   SPINE SURGERY  2014   Past Surgical History:  Procedure Laterality Date   BACK SURGERY     CHOLECYSTECTOMY     COLONOSCOPY N/A 04/23/2022   Procedure: COLONOSCOPY;  Surgeon: Toledo, Benay Pike, MD;  Location: [facility] ENDOSCOPY;  Service: Gastroenterology;  Laterality: N/A;   COLONOSCOPY WITH PROPOFOL N/A 05/18/2017   Procedure: COLONOSCOPY WITH PROPOFOL;  Surgeon: [name], MD;  Location: [facility] ENDOSCOPY;  Service: Endoscopy;  Laterality: N/A;   FOOT SURGERY  2015   GALLBLADDER SURGERY  2008   HARDWARE REMOVAL Left 02/14/2016   Procedure: LEFT FOOT REMOVAL DEEP IMPLANT;  Surgeon: [name], MD;  Location: [place];  Service: Orthopedics;  Laterality: Left;   HERNIA REPAIR     Inguinal Hernia Repair   SPINE SURGERY  2014   Past Medical History:  Diagnosis Date   Allergy    Anxiety    Aspiration pneumonia (HCC)    Depression    Frequent headaches    H/O   GERD (gastroesophageal reflux disease)    History of chicken pox  History of colon polyps    Hx of migraines    Hypertension    Lymphedema    Multiple sclerosis ([place]) 2011   OSA (obstructive sleep apnea)    PONV (postoperative nausea and vomiting)    Scoliosis    There were no vitals taken for this visit.  Opioid Risk Score:   Fall Risk Score:  `1  Depression screen PHQ 2/9     05/22/2022    4:07 PM 04/09/2022   10:00 AM 01/07/2022    3:15  PM 11/05/2021   10:10 AM 08/27/2021   11:12 AM 08/15/2021   10:50 AM 07/19/2021    3:28 PM  Depression screen PHQ 2/9  Decreased Interest 3 1 0 0 2 0 1  Down, Depressed, Hopeless 3 1 0 1 2 0 1  PHQ - 2 Score 6 2 0 1 4 0 2  Altered sleeping 3 0       Tired, decreased energy 3 1       Change in appetite 3 0       Feeling bad or failure about yourself  3 0       Trouble concentrating 3 0       Moving slowly or fidgety/restless 0 0       Suicidal thoughts 1 0       PHQ-9 Score 22 3       Difficult doing work/chores Somewhat difficult Not difficult at all         Review of Systems  Constitutional: Negative.   HENT: Negative.    Eyes: Negative.   Respiratory:  Negative for cough.        Chest congestion  Cardiovascular: Negative.   Gastrointestinal: Negative.   Endocrine: Negative.   Genitourinary: Negative.   Musculoskeletal:  Positive for back pain and gait problem.  Skin: Negative.   Allergic/Immunologic: Negative.   Hematological:  Bruises/bleeds easily.       Plavix  Psychiatric/Behavioral:  Positive for dysphoric mood.   All other systems reviewed and are negative.      Objective:   Physical Exam Patient seen via phone     Assessment & Plan:  1) Anxiety: -discussed with patient the side effects of both Seroquel and Lorazepam, as well as potential interactions with other medications. -discussed benefits of eating animal products to increase serotonin levels -discussed benefits of exercise/movement throughout the day -will refer to ne psychologist and psychiatrist, indicated on referral because she prefers female provider -sent new referral to psychiatry and indicated on referral that patient has not been diagnosed with dementia -discussed improvements since Dr. Kerman Passey started her on 0.'5mg'$  Lorazepam at night -recommended drinking chamomile tea with saffron every evening -advised that saffron can be purchased from the spices section of her grocery section and just 1  strand daily can be effective if mood elevation, and it is very good for health.  -recommended increasing Seroquel to '25mg'$  HS and adding an additional '25mg'$  during the day that can be taken once daily or split in half to be taken as 12.'5mg'$  BID PRN.  -discussed referral to psychiatry, placed referral to Dr. Sonny Dandy who was recommended to her by [name], discussed that it may be at least a week before she hears from them. -continue Zoloft, discussed that this will take 2-4 weeks to have effect, reinforced that she may not feel benefit from this yet. Discussed that not everyone response to every medicine and there are alternative options we can try if this  is not effective enough.  -discussed benefits of exercise and good nutrition -discussed her move because it is stressing her.  -discussed whether we should increase Seroquel due to her anxiety. Discussed that she is currently taking 12.'5mg'$  3 times per day PRN -discussed about her stress of seeing many different doctors -advised to always use the lowest dose of medications that she needs to minimize side effects -discussed the buspar that was started for her by Dr. Kerman Passey- she has not yet noted benefits from this, advised that she does not need to take it if she does not feel benefits.  -referred to psychiatry in [facility] for CBT -reinforced importance of thinking positively.  -continue to encourage non-pharmacologic management approaches as well, such as meditation, applying lavender oil, and exercise- all of which she has been trying and which have been helping.  -discussed alternative anxiety medications.  -continue seroquel 12.'5mg'$  daily and '25mg'$  at night. Can decreased to '25mg'$  at night and assess positive/negative effects during the day. Can restart 1/2 tab during the day if anxiety is uncontrolled.  -discontinue lorazepam.  -establish care with [name] for neuropsych counseling on 8/2. They really valued the appointment with him.   -discontinue topamax in case pain in right side is due to medication- discussed can also be due to antibiotics which she is taking for respiratory infection- discontinuing the topamax will help [place] know if it is the cause of her pain or not. -refilled [name] '30mg'$  HS.  -refilled Cymbalta '60mg'$  -Discussed exercise and meditation as tools to decrease anxiety. -Recommended Down Dog Yoga app -Discussed spending time outdoors. -Discussed positive re-framing of anxiety.  -Discussed the following foods that have been show to reduce anxiety: 1) [place] nuts, mushrooms, soy beans due to their high selenium content. Upper limit of toxicity of selenium is 419mg/day so no more than 3-4 bBolivianuts per day.  2) Fatty fish such as salmon, mackerel, sardines, trout, and herring- high in omega-3 fatty acids 3) Eggs- increases serotonin and dopamine 4) Pumpkin seeds- high in omega-3 fatty acids 5) dark chocolate- high in flavanols that increase blood flow to brain 6) turmeric- take with black pepper to increase absorption 7) chamomile tea- antioxidant and anti-inflammatory properties 8) yogurt without sugar- supports gut-brain axis 9) green tea- contains L- theanine 10) blueberries- high in vitamin C and antioxidants 11) [place] high in tryptophan which gets converted to serotonin 12) bell peppers- rich in vitamin C and antioxidants 13) citrus fruits- rich in vitamin C and antioxidants 14) almonds- high in vitamin E and healthy fats 15) chia seeds- high in omega-3 fatty acids  2) MS -discussed improvements in anxiety since she was started on 0.'5mg'$  Lorazepam nightly  -continue home therapy to minimize stiffness/maximize mobility -f/u with MS specialist. Discussed her appointmenr with Dr. SKerman Passey  -provided with neurology referral -discussed trying lipoic acid  3) HTN:  -discussed good blood pressure previously BP has been 130s-150s/80s, recommended citrus foods and nuts, as much mobility as she can  tolerate, log Bps daily and bring log to f/u appointment.  -continue current regimen and checking and checking BP.  -advised citrus foods and nuts to help lower BP. Discussed that once BP is consistently 120/80 we can wean her Losartan to '25mg'$ .   4) Bilateral lower extremity edema: - continue lymphedema therapy which is greatly helping.  -Lasix discontinued. Discussed that this should also improve her kidney function -advised low salt diet.  -prescribed new compression garments, will fax to company.  -discussed doing the compression  garments early in the morning at 9am  -discussed that her kidney function is normal on last check in March.   5) Insomnia: -recommended tart cherry juice with dinner, valerian root and chamomile teas in evening, applying lavender drops to forehead at night. -recommended chamomile tea with saffron a couple hours before bedtime -continue [name] '30mg'$  HS- discussed that we will not try to wean off again since we saw how much this is benefittng her -discontinue amantadine.  -discussed considering the Inspire trial since he has tried the CPAP for a long time now and can not tolerate it.   6) Obesity BMI 33.13 -attempted to wean off [name] but she developed worsening insomnia, anxiety, and decreased appetite that was undesirable to her, so have restarted the medication.  -discussed trial of avoiding gluten for 3 months and how this could potentially help with her weight loss, anxiety, and prevention/slowing of her chronic diseases. -discussed benefits of intermittent fasting- made goal to set a consistent dinner time and avoid snacking after this time. If she is hungry, choose a healthy snack of nuts, fruit, or vegetables.  -provided a link to a pdf of Pete Escogue's musculoskeletal alignment exercises: [email or web address]   7) Chest congestion -referred for pulmonary consult with [name], who  treated her father for pulmonary fibrosis. She has made an appointment to establish care with him.  -recommended drinking daily warm water with honey, lime and ginger -recommended drinking tea with holy Basil (Tulsi)  8) Sore throat: -continue salt water gargling  9) Sinus congestion: -recommended humidifier -drink 6-8 glasses of water per day.   10) fatigue -checked B12, folate, TSH, vitamin D, magnesium and discussed bloodwork with patient and husband: all in normal range except for suboptimal Vitamin D- high dose supplement prescribed -Recommended continuing NAC (N-Acetyl cysteine) '600mg'$  BID for her fatigue. Discussed its benefits in boosting glutathione and mitochondrial function. -prescribed monthly injectable B12 as patient would like to try this. Discussed that there are few risks to having elevated B12 levels -discussed that not wearing her CPAP is also likely contributing to her fatigue -discussed modafinil but she prefers to try the B12 first   11) OSA -discussed that OSA can contribute to daytime fatigue, weight gain, anxiety, and chronic conditions so it is good to get this condition treated. -recommended follow-up sleep study for re-eval -referred to Dr. Melida Quitter for eval for [facility] given that she is having a hard time sleeping with her CPAP. Discussed her decision not to pursue Inspire at this time and instead try to tolerate her current mask as best as possible. Discussed her positive conversation with [name]. -advised her to talk to her respiratory therapist about fitting another mask -discussed Inspire but she says unfortunately it is not covered by insurance.  -discussed strap under the chin.  -discussed following up with pulmonology for Inspire.  -agreed with her decision to continue to try CPAP before pursuing Inspire due to her fears of the procedure  12) Low back pain: -recommended doTerra Deep Blue Essential oil and applied to area of pain today-  discussed that this is made of natural plant oils. Shared by personal experience of benefit from use of this essential oil -provided a link to a pdf of Pete Escogue's musculoskeletal alignment exercises: [email or web address]   13) Lower extremity weakness: -discussed that this could be a possible side effect of the Risperdal.  -Recommended calling Dr. Garth Bigness office to let him know about the lower extremity weakness -discussed that she should go  to the ED as unilateral lower extremity weakness could be a symptom of stroke or MS flare -discussed stopping Risperdal until she can talk with [name] -Discussed using Seroquel prn for anxiety in the meantime  14) Depression -discussed decreasing Zoloft dose back to '150mg'$  since this dose had been working well for her and since she has been experiencing suicidal ideations with higher dose -discussed that suicidal ideations are common side effects of depression medications, that sometimes these medications can work effectively to treat depression but other times they give patients the energy to act on negative thoughts -encouraged that she call her kids to let them know how important it is to her to be able to see her grandchildren -discussed ECT with her caregiver  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 -recommended heat, range of motion, use of baclofen -will plan for 500U Dysport next visit -recommended increasing Baclofen to '15mg'$  TID PRN  16) Frequent falls -discussed that her facility is planning to send her to her facility's SNF -discussed that she will need to have NFL2 form filled by her PCP -discussed that the rehab and exercise she gets there will also help with her depression  17) Nausea -refilled zofran  18) Suicidal thoughts -discussed that suicidal thoughts have decreased since decreasing Zoloft to '150mg'$  -discussed follow-up with behavioral health on Friday  7 minutes spent in  discussion of her anxiety, that she was displeased she was assigned a female psychologist on Friday, discussed that I would put new referrals for a female psychologist and psychiatrist

## 2022-08-28 ENCOUNTER — Encounter: Payer: Medicare Other | Admitting: Physical Medicine and Rehabilitation

## 2022-08-28 DIAGNOSIS — R2681 Unsteadiness on feet: Secondary | ICD-10-CM | POA: Diagnosis not present

## 2022-08-28 DIAGNOSIS — R41841 Cognitive communication deficit: Secondary | ICD-10-CM | POA: Diagnosis not present

## 2022-08-28 DIAGNOSIS — F411 Generalized anxiety disorder: Secondary | ICD-10-CM

## 2022-08-28 DIAGNOSIS — G35 Multiple sclerosis: Secondary | ICD-10-CM | POA: Diagnosis not present

## 2022-08-28 DIAGNOSIS — R279 Unspecified lack of coordination: Secondary | ICD-10-CM | POA: Diagnosis not present

## 2022-08-28 DIAGNOSIS — R262 Difficulty in walking, not elsewhere classified: Secondary | ICD-10-CM | POA: Diagnosis not present

## 2022-08-28 DIAGNOSIS — M6281 Muscle weakness (generalized): Secondary | ICD-10-CM | POA: Diagnosis not present

## 2022-08-28 NOTE — Progress Notes (Signed)
[name], patient's caregiver, told me Mrs. Mcelhinney called in error and did not need a phone visit today

## 2022-08-29 ENCOUNTER — Encounter: Payer: Self-pay | Admitting: Hematology and Oncology

## 2022-08-29 DIAGNOSIS — R2681 Unsteadiness on feet: Secondary | ICD-10-CM | POA: Diagnosis not present

## 2022-08-29 DIAGNOSIS — G35 Multiple sclerosis: Secondary | ICD-10-CM | POA: Diagnosis not present

## 2022-08-29 DIAGNOSIS — R279 Unspecified lack of coordination: Secondary | ICD-10-CM | POA: Diagnosis not present

## 2022-08-29 DIAGNOSIS — R262 Difficulty in walking, not elsewhere classified: Secondary | ICD-10-CM | POA: Diagnosis not present

## 2022-08-29 DIAGNOSIS — R41841 Cognitive communication deficit: Secondary | ICD-10-CM | POA: Diagnosis not present

## 2022-08-29 DIAGNOSIS — M6281 Muscle weakness (generalized): Secondary | ICD-10-CM | POA: Diagnosis not present

## 2022-09-01 ENCOUNTER — Encounter (HOSPITAL_BASED_OUTPATIENT_CLINIC_OR_DEPARTMENT_OTHER): Payer: Medicare Other | Admitting: Physical Medicine and Rehabilitation

## 2022-09-01 DIAGNOSIS — R410 Disorientation, unspecified: Secondary | ICD-10-CM | POA: Diagnosis not present

## 2022-09-01 NOTE — Progress Notes (Signed)
Subjective:    Patient ID: Carly [name], female    DOB: 1951/06/27, 71 y.o.   MRN: [id number]  HPI  An audio/video tele-health visit is felt to be the most appropriate encounter for this patient at this time. This is a follow up tele-visit via phone. The patient is at home. MD is at office. Prior to scheduling this appointment, our staff discussed the limitations of evaluation and management by telemedicine and the availability of in-person appointments. The patient expressed understanding and agreed to proceed.    Carly [name] is a 71 year old woman who presents for f/u of confusion, white matter periventricular CVA, presents for follow-up regarding lymphedema, anxiety,chronic fatigue, insomnia, OSA, and weight gain, and new lower extremity weakness today.    1) Anxiety:  -improved since Dr. Kerman Passey started her on Lorazepam .'5mg'$  at night, she continues to get benefit from this -she asks what else she can do for this.  [name] is trying to help her to stay more active -discussed that she is doing better with the Zoloft at '150mg'$   -discussed that she was displeased that her behavioral health provider on Friday was female, she would prefer to talk with a female -she let the company know this and they do ot have any availability with female providers for 6 months -she says that psychiatry denied her referral and stated that it was because she had dementia. She notes that she has never ben diagnosed with dementia.  -her psychiatrist previously transitioned her from Seroquel back to benzodiazepine due to side effect profile of Seroquel, but this worsened her anxiety so we restarted it. She asks if she can increase Seroquel to '25mg'$  -she needs a new order for Seroquel as her current one only accounts for nighttime dose but she is also taking one prn during the day.  -she is currently a little out of it from the anesthesia for her recent colonoscopy -anxiety has been very severe -she started Zoloft  this week and has not yet noted any benefit, she is a little discouraged as felt this should take effect sooner.  -she would like referral to Dr. Sonny Dandy who was recommended to her by [name], she has not yet heard from their office -she really likes following with [name] as well -she feels that her inability to see her grandkids is a trigger for her anxiety. She used to see them mmore in the past and does not know why she doesn;t get to any mores -she does not want to restart her [name] as she does not want the weight gain associated with this medication -she has only been sleeping 4 hours per night and she feels this worsens her anxiety.  -she has been having a lot of anxiety related to her and her husband's move from their current home into a retirement home and she asks whether she may take the seroquel additionally as needed- once in the morning, once later in the day, and two at night -she is interested in following with a behavioral therapist -she and husband Carly [name] were scared of side effects of Seroquel that were discussed- she has not noted any this far.  -she has been sleeping but her legs feel very stiff at night -she continues to take Cymbalta '60mg'$  -we tried weaning her off the mirtazepine but she experienced weakness and insomnia and wanted to restart- titrated back up to her former dose.  -she discussed with her husband, who is also present on this call today,  and she would prefer to restart Seroquel and stop Lorazepam.  -she has not had any counseling and would like to restart this. She has an appointment with [name] in August and would like to start counseling earlier than this if possible.  -she loved speaking with [name] inpatient and would like to follow with him -she had symptoms of dizziness yesterday at the hairdresser and was asked to contact me regarding if this could be from the seroquel. She had had no other similar episodes since starting  it.  -she has been sleeping well with the Seroquel at night.  -she would like a list of foods that can help anxiety.  -she asks about the dietary advice I gave her last visit regarding anxiety. -she has been using essential oil.  -anxiety has been bad -off prednisone now -exercise helps her.  -she gets upset over things easily -she wants to be able to calm down.  -she needs refills of her cymbalta and [name]  2) MS: -her spasticity improved since Dr. Jennye Moccasin started her on 0.'5mg'$  Lorazepam.  -has been having a lot of stiffness recently, we had discssed increasing the baclofen and this does appear to have helped -EMS called twice due to difficulty getting her up off the floor -is receiving home therapy -she would like to see a different neurologist and asked for my recommendation   3) Lymphedema  -has not been using garment recently -has significant bilateral lower extremity  -has greatly improved with lymphedema therapy -she recently received a device that has helped so much that she was able to get off of Lasix.  -has ordered compression garments -does not eat processed foods or salt.  -she has ordered something from [employer] to give her lymphatic massage.  -improving but still very bothersome to her -discussed bloodwork today  4) HTN: -BP 138/79  5) Decreased appetite:  -resolved with increasing [name]  6) She developed pain in right side, and not sure if this is due to topamax or the antibiotics she is on to treat a respiratory infection.   7) Insomnia:  -sleeping poorly due to her CPAP mask, pressure is rising at night and this is causing mask leak. -sleeping about 5 hours per night and has been referred for another sleep study  -respiratory therapist will seeing her tomorrow.  -she asks about Inspire.  -she follows with [name] and she says he is also a sleep doctor -she hates to reduce the medicine.  -she has tried 5 different masks -the machine is very  loud.  -she can sleep without the CPAP fine.   8) Chest congestion -she has been having symptoms of congestion for greater than 3 months -Her father saw 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 She received a call to schedule an appointment with [name] office and is thankful for the referral.  -she asks what else she can do to help with this -she has been through multiple rounds of antibiotics without benefit.  -also feeling diffuse joint aches.   9) GERD -has been experiencing after eating  10) Sinus congestion -pleased that her CT results were normal.   11) OSA -she notes that she was found to have OSA on recent sleep study and she was tearful about this as she feels it is just another condition she has -sleeping 5 hours per night and has been referred for another sleep study -she was surprised as she feels  that she sleeps well.  -she has note been sleeping well while using the CPAP mask and was told that O2 is leaking -she is working with a respiratory therapist -she feels anxiety about whether she will get enough sleep tonight, she has only been sleeping about 4 hours since using the CPAP -she has decided she does not want to pursue Inspire yet until she has learned more about it.  -she had a very productive visit with [name] and he advised her to try her best to tolerate the CPAP mask, which she is doing.  -followed up with pulmonology regarding [name] and was told she would be a good candidate for this treatment, but she should prefer to continue trying CPAP mask before trying Inspire  12) Chronic fatigue -she was diagnosed with COVID 9 days ago and her breathing has improved but she has been feeling very fatigued -she has been taking the N-acetyl-cysteine supplement -she asks about taking injetable B12 as this really helped her son -she has not been wearing her CPAP as she is waiting for a new  mask  13) Pain -takes Tylenol and baclofen -would like to get Dysport next available appointment  14) Lower extremity weakness: -improved -went to ED and there was no evidence of stroke. Thought to be due to her Risperdal -her legs feel stiff -no other focal deficits -she started Risperdal on Wednesday night and asks if this could be causing her weakness -[name] has been trying to wean her off Mirtazapine and Seroquel due to weight gain and onto Risperdal for her anxiety  15) Bilateral lower extremity pain -feels stiff and tight like spasticity -she asks if she can take extra Baclofen  16) depression --she has been doing better on the '150mg'$  Zoloft -there is another medication she would also like to try -she has been having a hard time getting in touch with her psychiatrist regarding the suicidal ideation she has been experiencing  17) frequent falls -she says she is being admitted to [facility] due to recent frequent falls. -she has not had any more falls since admission  18) Nausea -needs refill of Zofran  19) Confusion: -[name] has noted increased confusion since she was started on Gabapentin '100mg'$  up to 4 times per day as needed and Trazodone was increased to '100mg'$  at night  Pain Inventory Average Pain 5 Pain Right Now 2 My pain is aching and no pain.  Sometimes spasm in legs at night because of MST  In the last 24 hours, has pain interfered with the following? General activity 4 Relation with others 7 Enjoyment of life 5  What TIME of day is your pain at its worst? night Sleep (in general) Fair  Pain is worse with: sitting and unsure Pain improves with: medication Relief from Meds: 5  Family History  Problem Relation Age of Onset   Arthritis Mother    Hypertension Mother    Macular degeneration Mother    Hypertension Father    Hyperlipidemia Father    Heart disease Maternal Grandfather    Diabetes Maternal Grandfather    Kidney disease Paternal Grandmother     Social History   Socioeconomic History   Marital status: Married    Spouse name: Engineer, technical sales   Number of children: 3   Years of education: 14   Highest education level: Associate degree: academic program  Occupational History   Not on file  Tobacco Use   Smoking status: Never   Smokeless tobacco: Never  Vaping Use  Vaping Use: Never used  Substance and Sexual Activity   Alcohol use: No    Alcohol/week: 0.0 standard drinks of alcohol   Drug use: No   Sexual activity: Not Currently  Other Topics Concern   Not on file  Social History Narrative   Lives with husband    Right handed   Caffeine: 1 cup of coffee in the AM, coke occa. Trying to come off caffeine    Social Determinants of Health   Financial Resource Strain: Low Risk  (01/07/2022)   Overall Financial Resource Strain (CARDIA)    Difficulty of Paying Living Expenses: Not hard at all  Food Insecurity: No Food Insecurity (01/07/2022)   Hunger Vital Sign    Worried About Running Out of Food in the Last Year: Never true    Ran Out of Food in the Last Year: Never true  Transportation Needs: No Transportation Needs (01/07/2022)   PRAPARE - Hydrologist (Medical): No    Lack of Transportation (Non-Medical): No  Physical Activity: Insufficiently Active (01/07/2022)   Exercise Vital Sign    Days of Exercise per Week: 2 days    Minutes of Exercise per Session: 60 min  Stress: No Stress Concern Present (01/07/2022)   [place]    Feeling of Stress : Not at all  Social Connections: Unknown (01/07/2022)   Social Connection and Isolation Panel [NHANES]    Frequency of Communication with Friends and Family: Not on file    Frequency of Social Gatherings with Friends and Family: Not on file    Attends Religious Services: Not on file    Active Member of Clubs or Organizations: Not on file    Attends Archivist Meetings: Not on file     Marital Status: Married   Past Surgical History:  Procedure Laterality Date   BACK SURGERY     CHOLECYSTECTOMY     COLONOSCOPY N/A 04/23/2022   Procedure: COLONOSCOPY;  Surgeon: Toledo, Benay Pike, MD;  Location: [facility] ENDOSCOPY;  Service: Gastroenterology;  Laterality: N/A;   COLONOSCOPY WITH PROPOFOL N/A 05/18/2017   Procedure: COLONOSCOPY WITH PROPOFOL;  Surgeon: [name], MD;  Location: [facility] ENDOSCOPY;  Service: Endoscopy;  Laterality: N/A;   FOOT SURGERY  2015   GALLBLADDER SURGERY  2008   HARDWARE REMOVAL Left 02/14/2016   Procedure: LEFT FOOT REMOVAL DEEP IMPLANT;  Surgeon: [name], MD;  Location: [place];  Service: Orthopedics;  Laterality: Left;   HERNIA REPAIR     Inguinal Hernia Repair   SPINE SURGERY  2014   Past Surgical History:  Procedure Laterality Date   BACK SURGERY     CHOLECYSTECTOMY     COLONOSCOPY N/A 04/23/2022   Procedure: COLONOSCOPY;  Surgeon: Toledo, Benay Pike, MD;  Location: [facility] ENDOSCOPY;  Service: Gastroenterology;  Laterality: N/A;   COLONOSCOPY WITH PROPOFOL N/A 05/18/2017   Procedure: COLONOSCOPY WITH PROPOFOL;  Surgeon: [name], MD;  Location: [facility] ENDOSCOPY;  Service: Endoscopy;  Laterality: N/A;   FOOT SURGERY  2015   GALLBLADDER SURGERY  2008   HARDWARE REMOVAL Left 02/14/2016   Procedure: LEFT FOOT REMOVAL DEEP IMPLANT;  Surgeon: [name], MD;  Location: [place];  Service: Orthopedics;  Laterality: Left;   HERNIA REPAIR     Inguinal Hernia Repair   SPINE SURGERY  2014   Past Medical History:  Diagnosis Date   Allergy    Anxiety  Aspiration pneumonia (HCC)    Depression    Frequent headaches    H/O   GERD (gastroesophageal reflux disease)    History of chicken pox    History of colon polyps    Hx of migraines    Hypertension    Lymphedema    Multiple sclerosis ([place]) 2011   OSA (obstructive sleep apnea)    PONV (postoperative nausea and vomiting)    Scoliosis    There were  no vitals taken for this visit.  Opioid Risk Score:   Fall Risk Score:  `1  Depression screen PHQ 2/9     05/22/2022    4:07 PM 04/09/2022   10:00 AM 01/07/2022    3:15 PM 11/05/2021   10:10 AM 08/27/2021   11:12 AM 08/15/2021   10:50 AM 07/19/2021    3:28 PM  Depression screen PHQ 2/9  Decreased Interest 3 1 0 0 2 0 1  Down, Depressed, Hopeless 3 1 0 1 2 0 1  PHQ - 2 Score 6 2 0 1 4 0 2  Altered sleeping 3 0       Tired, decreased energy 3 1       Change in appetite 3 0       Feeling bad or failure about yourself  3 0       Trouble concentrating 3 0       Moving slowly or fidgety/restless 0 0       Suicidal thoughts 1 0       PHQ-9 Score 22 3       Difficult doing work/chores Somewhat difficult Not difficult at all         Review of Systems  Constitutional: Negative.   HENT: Negative.    Eyes: Negative.   Respiratory:  Negative for cough.        Chest congestion  Cardiovascular: Negative.   Gastrointestinal: Negative.   Endocrine: Negative.   Genitourinary: Negative.   Musculoskeletal:  Positive for back pain and gait problem.  Skin: Negative.   Allergic/Immunologic: Negative.   Hematological:  Bruises/bleeds easily.       Plavix  Psychiatric/Behavioral:  Positive for dysphoric mood.   All other systems reviewed and are negative.      Objective:   Physical Exam Patient seen via phone     Assessment & Plan:  1) Anxiety: -discussed with patient the side effects of both Seroquel and Lorazepam, as well as potential interactions with other medications. -discussed benefits of eating animal products to increase serotonin levels -discussed benefits of exercise/movement throughout the day -will refer to ne psychologist and psychiatrist, indicated on referral because she prefers female provider -sent new referral to psychiatry and indicated on referral that patient has not been diagnosed with dementia -discussed improvements since Dr. Kerman Passey started her on 0.'5mg'$   Lorazepam at night -recommended drinking chamomile tea with saffron every evening -advised that saffron can be purchased from the spices section of her grocery section and just 1 strand daily can be effective if mood elevation, and it is very good for health.  -recommended increasing Seroquel to '25mg'$  HS and adding an additional '25mg'$  during the day that can be taken once daily or split in half to be taken as 12.'5mg'$  BID PRN.  -discussed referral to psychiatry, placed referral to Dr. Sonny Dandy who was recommended to her by [name], discussed that it may be at least a week before she hears from them. -continue Zoloft, discussed that this will take 2-4  weeks to have effect, reinforced that she may not feel benefit from this yet. Discussed that not everyone response to every medicine and there are alternative options we can try if this is not effective enough.  -discussed benefits of exercise and good nutrition -discussed her move because it is stressing her.  -discussed whether we should increase Seroquel due to her anxiety. Discussed that she is currently taking 12.'5mg'$  3 times per day PRN -discussed about her stress of seeing many different doctors -advised to always use the lowest dose of medications that she needs to minimize side effects -discussed the buspar that was started for her by Dr. Kerman Passey- she has not yet noted benefits from this, advised that she does not need to take it if she does not feel benefits.  -referred to psychiatry in [facility] for CBT -reinforced importance of thinking positively.  -continue to encourage non-pharmacologic management approaches as well, such as meditation, applying lavender oil, and exercise- all of which she has been trying and which have been helping.  -discussed alternative anxiety medications.  -continue seroquel 12.'5mg'$  daily and '25mg'$  at night. Can decreased to '25mg'$  at night and assess positive/negative effects during the day. Can restart 1/2 tab during  the day if anxiety is uncontrolled.  -discontinue lorazepam.  -establish care with [name] for neuropsych counseling on 8/2. They really valued the appointment with him.  -discontinue topamax in case pain in right side is due to medication- discussed can also be due to antibiotics which she is taking for respiratory infection- discontinuing the topamax will help [place] know if it is the cause of her pain or not. -refilled [name] '30mg'$  HS.  -refilled Cymbalta '60mg'$  -Discussed exercise and meditation as tools to decrease anxiety. -Recommended Down Dog Yoga app -Discussed spending time outdoors. -Discussed positive re-framing of anxiety.  -Discussed the following foods that have been show to reduce anxiety: 1) [place] nuts, mushrooms, soy beans due to their high selenium content. Upper limit of toxicity of selenium is 49mg/day so no more than 3-4 bBolivianuts per day.  2) Fatty fish such as salmon, mackerel, sardines, trout, and herring- high in omega-3 fatty acids 3) Eggs- increases serotonin and dopamine 4) Pumpkin seeds- high in omega-3 fatty acids 5) dark chocolate- high in flavanols that increase blood flow to brain 6) turmeric- take with black pepper to increase absorption 7) chamomile tea- antioxidant and anti-inflammatory properties 8) yogurt without sugar- supports gut-brain axis 9) green tea- contains L- theanine 10) blueberries- high in vitamin C and antioxidants 11) [place] high in tryptophan which gets converted to serotonin 12) bell peppers- rich in vitamin C and antioxidants 13) citrus fruits- rich in vitamin C and antioxidants 14) almonds- high in vitamin E and healthy fats 15) chia seeds- high in omega-3 fatty acids  2) MS -discussed improvements in anxiety since she was started on 0.'5mg'$  Lorazepam nightly  -continue home therapy to minimize stiffness/maximize mobility -f/u with MS specialist. Discussed her appointmenr with Dr. SKerman Passey  -provided with neurology  referral -discussed trying lipoic acid  3) HTN:  -discussed good blood pressure previously BP has been 130s-150s/80s, recommended citrus foods and nuts, as much mobility as she can tolerate, log Bps daily and bring log to f/u appointment.  -continue current regimen and checking and checking BP.  -advised citrus foods and nuts to help lower BP. Discussed that once BP is consistently 120/80 we can wean her Losartan to '25mg'$ .   4) Bilateral lower extremity edema: - continue lymphedema therapy which is  greatly helping.  -Lasix discontinued. Discussed that this should also improve her kidney function -advised low salt diet.  -prescribed new compression garments, will fax to company.  -discussed doing the compression garments early in the morning at 9am  -discussed that her kidney function is normal on last check in March.   5) Insomnia: -recommended tart cherry juice with dinner, valerian root and chamomile teas in evening, applying lavender drops to forehead at night. -recommended chamomile tea with saffron a couple hours before bedtime -continue [name] '30mg'$  HS- discussed that we will not try to wean off again since we saw how much this is benefittng her -discontinue amantadine.  -discussed considering the Inspire trial since he has tried the CPAP for a long time now and can not tolerate it.   6) Obesity BMI 33.13 -attempted to wean off [name] but she developed worsening insomnia, anxiety, and decreased appetite that was undesirable to her, so have restarted the medication.  -discussed trial of avoiding gluten for 3 months and how this could potentially help with her weight loss, anxiety, and prevention/slowing of her chronic diseases. -discussed benefits of intermittent fasting- made goal to set a consistent dinner time and avoid snacking after this time. If she is hungry, choose a healthy snack of nuts, fruit, or vegetables.  -provided a link to a pdf of Pete Escogue's  musculoskeletal alignment exercises: [email or web address]   7) Chest congestion -referred for pulmonary consult with [name], who treated her father for pulmonary fibrosis. She has made an appointment to establish care with him.  -recommended drinking daily warm water with honey, lime and ginger -recommended drinking tea with holy Basil (Tulsi)  8) Sore throat: -continue salt water gargling  9) Sinus congestion: -recommended humidifier -drink 6-8 glasses of water per day.   10) fatigue -checked B12, folate, TSH, vitamin D, magnesium and discussed bloodwork with patient and husband: all in normal range except for suboptimal Vitamin D- high dose supplement prescribed -Recommended continuing NAC (N-Acetyl cysteine) '600mg'$  BID for her fatigue. Discussed its benefits in boosting glutathione and mitochondrial function. -prescribed monthly injectable B12 as patient would like to try this. Discussed that there are few risks to having elevated B12 levels -discussed that not wearing her CPAP is also likely contributing to her fatigue -discussed modafinil but she prefers to try the B12 first   11) OSA -discussed that OSA can contribute to daytime fatigue, weight gain, anxiety, and chronic conditions so it is good to get this condition treated. -recommended follow-up sleep study for re-eval -referred to Dr. Melida Quitter for eval for [facility] given that she is having a hard time sleeping with her CPAP. Discussed her decision not to pursue Inspire at this time and instead try to tolerate her current mask as best as possible. Discussed her positive conversation with [name]. -advised her to talk to her respiratory therapist about fitting another mask -discussed Inspire but she says unfortunately it is not covered by insurance.  -discussed strap under the chin.  -discussed following up with pulmonology for Inspire.  -agreed with her  decision to continue to try CPAP before pursuing Inspire due to her fears of the procedure  12) Low back pain: -recommended doTerra Deep Blue Essential oil and applied to area of pain today- discussed that this is made of natural plant oils. Shared by personal experience of benefit from use of this essential oil -provided a link to a pdf of Pete Escogue's musculoskeletal alignment exercises: [email or web address]   13) Lower extremity weakness: -  discussed that this could be a possible side effect of the Risperdal.  -Recommended calling Dr. Garth Bigness office to let him know about the lower extremity weakness -discussed that she should go to the ED as unilateral lower extremity weakness could be a symptom of stroke or MS flare -discussed stopping Risperdal until she can talk with [name] -Discussed using Seroquel prn for anxiety in the meantime  14) Depression -discussed decreasing Zoloft dose back to '150mg'$  since this dose had been working well for her and since she has been experiencing suicidal ideations with higher dose -discussed that suicidal ideations are common side effects of depression medications, that sometimes these medications can work effectively to treat depression but other times they give patients the energy to act on negative thoughts -encouraged that she call her kids to let them know how important it is to her to be able to see her grandchildren -discussed ECT with her caregiver  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 -recommended heat, range of motion, use of baclofen -will plan for 500U Dysport next visit -recommended increasing Baclofen to '15mg'$  TID PRN  16) Frequent falls -discussed that her facility is planning to send her to her facility's SNF -discussed that she will need to have NFL2 form filled by her PCP -discussed that the rehab and exercise she gets there will also help with her depression  17) Nausea -refilled  zofran  18) Suicidal thoughts -discussed that suicidal thoughts have decreased since decreasing Zoloft to '150mg'$  -discussed follow-up with behavioral health on Friday  19) Confusion: -discussed that both gabapentin and trazodone can cause confusion -discussed that if she would like to taper off these medications she can decrease Trazodone to '50mg'$  at night and decrease Gabapentin by 1 tablet per day  6 minutes spent in discussion of her confusion since starting Gabapentin and Trazodone, discussing safe wean of these medications

## 2022-09-02 DIAGNOSIS — R262 Difficulty in walking, not elsewhere classified: Secondary | ICD-10-CM | POA: Diagnosis not present

## 2022-09-02 DIAGNOSIS — G35 Multiple sclerosis: Secondary | ICD-10-CM | POA: Diagnosis not present

## 2022-09-02 DIAGNOSIS — R279 Unspecified lack of coordination: Secondary | ICD-10-CM | POA: Diagnosis not present

## 2022-09-02 DIAGNOSIS — R2681 Unsteadiness on feet: Secondary | ICD-10-CM | POA: Diagnosis not present

## 2022-09-02 DIAGNOSIS — M6281 Muscle weakness (generalized): Secondary | ICD-10-CM | POA: Diagnosis not present

## 2022-09-02 DIAGNOSIS — R41841 Cognitive communication deficit: Secondary | ICD-10-CM | POA: Diagnosis not present

## 2022-09-03 DIAGNOSIS — R2681 Unsteadiness on feet: Secondary | ICD-10-CM | POA: Diagnosis not present

## 2022-09-03 DIAGNOSIS — G35 Multiple sclerosis: Secondary | ICD-10-CM | POA: Diagnosis not present

## 2022-09-03 DIAGNOSIS — M6281 Muscle weakness (generalized): Secondary | ICD-10-CM | POA: Diagnosis not present

## 2022-09-03 DIAGNOSIS — R279 Unspecified lack of coordination: Secondary | ICD-10-CM | POA: Diagnosis not present

## 2022-09-03 DIAGNOSIS — R262 Difficulty in walking, not elsewhere classified: Secondary | ICD-10-CM | POA: Diagnosis not present

## 2022-09-03 DIAGNOSIS — R41841 Cognitive communication deficit: Secondary | ICD-10-CM | POA: Diagnosis not present

## 2022-09-08 ENCOUNTER — Telehealth: Payer: Self-pay | Admitting: Internal Medicine

## 2022-09-08 DIAGNOSIS — R279 Unspecified lack of coordination: Secondary | ICD-10-CM | POA: Diagnosis not present

## 2022-09-08 DIAGNOSIS — R2681 Unsteadiness on feet: Secondary | ICD-10-CM | POA: Diagnosis not present

## 2022-09-08 DIAGNOSIS — R41841 Cognitive communication deficit: Secondary | ICD-10-CM | POA: Diagnosis not present

## 2022-09-08 DIAGNOSIS — M6281 Muscle weakness (generalized): Secondary | ICD-10-CM | POA: Diagnosis not present

## 2022-09-08 DIAGNOSIS — R262 Difficulty in walking, not elsewhere classified: Secondary | ICD-10-CM | POA: Diagnosis not present

## 2022-09-08 DIAGNOSIS — G35 Multiple sclerosis: Secondary | ICD-10-CM | POA: Diagnosis not present

## 2022-09-08 NOTE — Telephone Encounter (Signed)
[name] called stating pt has a pressure sore coming up on her tailbone and krista request the provider to call in duoderm. [name] wants a call back [phone]

## 2022-09-08 NOTE — Telephone Encounter (Signed)
S/w [name] - stated pt home from skilled nursing x 1 mo [name] was off for 4 days, came back today and rolled pt over due to pt complaining tailbone area hurt. Per [name] - looks like there is a pressure sore on pt tailbone.  [name] stated the area does not look infected, just sore.   [name] asking for rx for duoderm to try to help heal pressure sore. [name] has bought pt a donut to sit on and a pillow to help pt sleep on side to take pressure off sore.

## 2022-09-08 NOTE — Telephone Encounter (Signed)
Please call and confirm what is going on.  Need specifics of skin lesion,  etc.  Is there a wound care nurse available that can follow.

## 2022-09-08 NOTE — Telephone Encounter (Signed)
Please call and have nurse go by and evaluate.  Let us know how looks and what is needed.

## 2022-09-09 ENCOUNTER — Encounter: Payer: Medicare Other | Admitting: Physical Medicine and Rehabilitation

## 2022-09-09 NOTE — Telephone Encounter (Signed)
S/w Tabitha - Nurse Stated will go out to [facility] tomorrow AM to evaluate wound. Will call me back after she sees Yuliza to let us know what she has observes and what she thinks is necessary for care.

## 2022-09-09 NOTE — Telephone Encounter (Signed)
Lm #2 at [facility] for [facility] for wound care for pt

## 2022-09-09 NOTE — Telephone Encounter (Signed)
Noted.  Hold until receive call back.

## 2022-09-12 DIAGNOSIS — Z8673 Personal history of transient ischemic attack (TIA), and cerebral infarction without residual deficits: Secondary | ICD-10-CM | POA: Diagnosis not present

## 2022-09-12 DIAGNOSIS — M6281 Muscle weakness (generalized): Secondary | ICD-10-CM | POA: Diagnosis not present

## 2022-09-12 DIAGNOSIS — R279 Unspecified lack of coordination: Secondary | ICD-10-CM | POA: Diagnosis not present

## 2022-09-12 DIAGNOSIS — Z9181 History of falling: Secondary | ICD-10-CM | POA: Diagnosis not present

## 2022-09-12 DIAGNOSIS — R41841 Cognitive communication deficit: Secondary | ICD-10-CM | POA: Diagnosis not present

## 2022-09-12 DIAGNOSIS — G35 Multiple sclerosis: Secondary | ICD-10-CM | POA: Diagnosis not present

## 2022-09-12 DIAGNOSIS — R262 Difficulty in walking, not elsewhere classified: Secondary | ICD-10-CM | POA: Diagnosis not present

## 2022-09-12 DIAGNOSIS — R2681 Unsteadiness on feet: Secondary | ICD-10-CM | POA: Diagnosis not present

## 2022-09-12 NOTE — Telephone Encounter (Signed)
LVM for krista or tabitha the nurse to call back for an update, I called the patient and she stated that the nurse did comeout today and checked on her wound and everything was fine.  Patches Mcdonnell,cma

## 2022-09-12 NOTE — Telephone Encounter (Signed)
Per Dr. Nicki Reaper, please check for update today

## 2022-09-13 ENCOUNTER — Emergency Department
Admission: EM | Admit: 2022-09-13 | Discharge: 2022-09-13 | Disposition: A | Discharge status: Home / Self Care | Payer: Medicare Other | Attending: Emergency Medicine | Admitting: Emergency Medicine

## 2022-09-13 ENCOUNTER — Emergency Department: Payer: Medicare Other

## 2022-09-13 DIAGNOSIS — R1032 Left lower quadrant pain: Secondary | ICD-10-CM | POA: Diagnosis not present

## 2022-09-13 DIAGNOSIS — M79605 Pain in left leg: Secondary | ICD-10-CM | POA: Diagnosis not present

## 2022-09-13 DIAGNOSIS — M7989 Other specified soft tissue disorders: Secondary | ICD-10-CM | POA: Diagnosis not present

## 2022-09-13 DIAGNOSIS — I10 Essential (primary) hypertension: Secondary | ICD-10-CM | POA: Diagnosis not present

## 2022-09-13 DIAGNOSIS — I700 Atherosclerosis of aorta: Secondary | ICD-10-CM | POA: Diagnosis not present

## 2022-09-13 DIAGNOSIS — R109 Unspecified abdominal pain: Secondary | ICD-10-CM | POA: Diagnosis not present

## 2022-09-13 DIAGNOSIS — R1011 Right upper quadrant pain: Secondary | ICD-10-CM | POA: Diagnosis not present

## 2022-09-13 DIAGNOSIS — Z7401 Bed confinement status: Secondary | ICD-10-CM | POA: Diagnosis not present

## 2022-09-13 DIAGNOSIS — M79662 Pain in left lower leg: Secondary | ICD-10-CM | POA: Diagnosis not present

## 2022-09-13 DIAGNOSIS — R1084 Generalized abdominal pain: Secondary | ICD-10-CM

## 2022-09-13 DIAGNOSIS — I959 Hypotension, unspecified: Secondary | ICD-10-CM | POA: Diagnosis not present

## 2022-09-13 LAB — COMPREHENSIVE METABOLIC PANEL
ALT: 17 U/L (ref 0–44)
AST: 18 U/L (ref 15–41)
Albumin: 4.3 g/dL (ref 3.5–5.0)
Alkaline Phosphatase: 72 U/L (ref 38–126)
Anion gap: 8 (ref 5–15)
BUN: 9 mg/dL (ref 8–23)
CO2: 26 mmol/L (ref 22–32)
Calcium: 9.6 mg/dL (ref 8.9–10.3)
Chloride: 108 mmol/L (ref 98–111)
Creatinine, Ser: 0.52 mg/dL (ref 0.44–1.00)
GFR, Estimated: >60 mL/min (ref >60)
Glucose, Bld: 132 mg/dL — ABNORMAL HIGH (ref 70–99)
Potassium: 4 mmol/L (ref 3.5–5.1)
Sodium: 142 mmol/L (ref 135–145)
Total Bilirubin: 0.5 mg/dL (ref 0.3–1.2)
Total Protein: 7.2 g/dL (ref 6.5–8.1)

## 2022-09-13 LAB — URINALYSIS, ROUTINE W REFLEX MICROSCOPIC
Bilirubin Urine: NEGATIVE
Glucose, UA: NEGATIVE mg/dL
Hgb urine dipstick: NEGATIVE
Ketones, ur: NEGATIVE mg/dL
Leukocytes,Ua: NEGATIVE
Nitrite: NEGATIVE
Protein, ur: NEGATIVE mg/dL
Specific Gravity, Urine: 1.016 (ref 1.005–1.030)
pH: 7 (ref 5.0–8.0)

## 2022-09-13 LAB — CBC
HCT: 42.1 % (ref 36.0–46.0)
Hemoglobin: 13.3 g/dL (ref 12.0–15.0)
MCH: 26 pg (ref 26.0–34.0)
MCHC: 31.6 g/dL (ref 30.0–36.0)
MCV: 82.2 fL (ref 80.0–100.0)
Platelets: 277 10*3/uL (ref 150–400)
RBC: 5.12 MIL/uL — ABNORMAL HIGH (ref 3.87–5.11)
RDW: 15.8 % — ABNORMAL HIGH (ref 11.5–15.5)
WBC: 8.2 10*3/uL (ref 4.0–10.5)
nRBC: 0 % (ref 0.0–0.2)

## 2022-09-13 LAB — LIPASE, BLOOD: Lipase: 31 U/L (ref 11–51)

## 2022-09-13 MED ORDER — GABAPENTIN 100 MG PO CAPS
200.0000 mg | ORAL_CAPSULE | Freq: Once | ORAL | Status: AC
  Administered 2022-09-13: 200 mg via ORAL
  Filled 2022-09-13: qty 2

## 2022-09-13 MED ORDER — IOHEXOL 300 MG/ML  SOLN
100.0000 mL | Freq: Once | INTRAMUSCULAR | Status: AC | PRN
  Administered 2022-09-13: 100 mL via INTRAVENOUS

## 2022-09-13 MED ORDER — MORPHINE SULFATE (PF) 4 MG/ML IV SOLN
4.0000 mg | Freq: Once | INTRAVENOUS | Status: AC
  Administered 2022-09-13: 4 mg via INTRAVENOUS
  Filled 2022-09-13: qty 1

## 2022-09-13 MED ORDER — MORPHINE SULFATE (PF) 2 MG/ML IV SOLN
2.0000 mg | Freq: Once | INTRAVENOUS | Status: AC
  Administered 2022-09-13: 2 mg via INTRAVENOUS
  Filled 2022-09-13: qty 1

## 2022-09-13 MED ORDER — ONDANSETRON 4 MG PO TBDP
4.0000 mg | ORAL_TABLET | Freq: Once | ORAL | Status: AC | PRN
  Administered 2022-09-13: 4 mg via ORAL
  Filled 2022-09-13: qty 1

## 2022-09-13 MED ORDER — ACETAMINOPHEN 325 MG PO TABS
650.0000 mg | ORAL_TABLET | Freq: Once | ORAL | Status: AC
  Administered 2022-09-13: 650 mg via ORAL
  Filled 2022-09-13: qty 2

## 2022-09-13 NOTE — ED Notes (Signed)
Patient  states she is still in pain. Dr. Jacelyn Grip aware.

## 2022-09-13 NOTE — ED Notes (Signed)
Pt states she was here in July for same issue. Gabapentin, flexeril, and other medications were given for a pinched nerve. Gabapentin seemed to help per pt and pt's husband

## 2022-09-13 NOTE — Discharge Instructions (Addendum)
Continue taking your pain medications including gabapentin for your nerve pain and follow-up with your primary doctor for medication adjustments as needed for ongoing pain.  You can try to increase her dose to 200 mg 3 times a day of gabapentin, and then discuss with your doctor on Monday how best to manage her pain medications moving forward.  Your leg ultrasound was negative for blood clots but if you continue to have worsening pain and swelling, check with your doctor next week for a recheck with another ultrasound.  Thank you for choosing [place] for your health care today!  Please see your primary doctor this week for a follow up appointment.   If you do not have a primary doctor call the following clinics to establish care:  If you have insurance:  [facility] [phone] [place] [address]   [facility]  [phone] [place] [address]   If you do not have insurance:  [facility]  [phone] [address] [place] [address]  Sometimes, in the early stages of certain disease courses it is difficult to detect in the emergency department evaluation -- so, it is important that you continue to monitor your symptoms and call your doctor right away or return to the emergency department if you develop any new or worsening symptoms.  It was my pleasure to care for you today.   Hoover Brunette Jacelyn Grip, MD

## 2022-09-13 NOTE — ED Notes (Signed)
Transportation requested  

## 2022-09-13 NOTE — ED Triage Notes (Signed)
Pt presents to ED from home due to abdominal pain and leg pain x4 days. Pt states hx MS. Pt states she is having L leg pain. Pt c/o nausea x4. Pt is taking home medications '100mg'$  gabapentin, '10mg'$  baclofen, '5mg'$  flexeril, and tylenol. Pt poor historian, family bedside. Pt states "I am a little out of it, I've taken muscle relaxers".

## 2022-09-13 NOTE — ED Notes (Signed)
Patient's husband states that beginning at approximately 0600 this morning, patient has had baclofen (routine),  gabapentin (increased from one '100mg'$  tab to (2) '100mg'$  tab for increased leg pain), two extra-strength Tylenol prn, lorazepam prn, flexeril prn for increased leg pain.

## 2022-09-13 NOTE — ED Provider Notes (Addendum)
[facility] Provider Note    Event Date/Time   First MD Initiated Contact with Patient 09/13/22 1355     (approximate)   History   Leg Pain   HPI  Carly [name] is a 71 y.o. female   Past medical history of multiple sclerosis, depression, GERD, hypertension, lymphedema, cognitive and behavioral changes who presents to the emergency department with abdominal pain and leg pain.  4 days of atraumatic left leg pain.  History of chronic leg pain managed by neurologist with multiple pain medications.She has baseline bedbound and uses a lift to get in and around her house 4 days of nausea and vague bloating abdominal pain.  No diarrhea or blood in the stools.  No vaginal discharge or bleeding.  No dysuria or urinary symptoms.  History was obtained via the patient.  Her husband is at bedside as an independent historian who corroborates information obtained as above.      Physical Exam   Triage Vital Signs: ED Triage Vitals [09/13/22 1131]  Enc Vitals Group     BP (!) 158/82     Pulse Rate 87     Resp 18     Temp 98.9 F (37.2 C)     Temp Source Oral     SpO2 94 %     Weight      Height      Head Circumference      Peak Flow      Pain Score 0     Pain Loc      Pain Edu?      Excl. in [place]?     Most recent vital signs: Vitals:   09/13/22 1745 09/13/22 1830  BP:    Pulse:    Resp: 14 14  Temp:    SpO2:      General: Awake, no distress.  CV:  Good peripheral perfusion.  Resp:  Normal effort.  Abd:  No distention.  She has left lower quadrant tenderness without rigidity or guarding. Other:  Neurovascular intact to the affected left lower extremity.  No bony tenderness, no pain with passive range of motion.  No obvious overlying infectious changes of skin, crepitus, pain out of proportion.   ED Results / Procedures / Treatments   Labs (all labs ordered are listed, but only abnormal results are displayed) Labs Reviewed   COMPREHENSIVE METABOLIC PANEL - Abnormal; Notable for the following components:      Result Value   Glucose, Bld 132 (*)    All other components within normal limits  CBC - Abnormal; Notable for the following components:   RBC 5.12 (*)    RDW 15.8 (*)    All other components within normal limits  URINALYSIS, ROUTINE W REFLEX MICROSCOPIC - Abnormal; Notable for the following components:   Color, Urine YELLOW (*)    APPearance HAZY (*)    All other components within normal limits  LIPASE, BLOOD     I reviewed labs and they are notable for white blood cell count is normal 8.2  EKG  ED ECG REPORT I, Lucillie Garfinkel, the attending physician, personally viewed and interpreted this ECG.   Date: 09/13/2022  EKG Time: 1135  Rate: 79  Rhythm: normal sinus rhythm  Axis: nl  Intervals:none  ST&T Change: no acute ischemic changes    RADIOLOGY I independently reviewed and interpreted CT abd and see no obvious infection or obstructive pattern   PROCEDURES:  Critical Care performed: No  Procedures  MEDICATIONS ORDERED IN ED:   IMPRESSION / MDM / ASSESSMENT AND PLAN / ED COURSE  I reviewed the triage vital signs and the nursing notes.                              Differential diagnosis includes, but is not limited to, VTE, venous insufficiency or lymphedema, MS flare, lumbar radiculopathy, fractures or dislocations less likely, intra-abdominal infection, urinary tract infection, obstruction  MDM: Patient with abdominal bloating pain and tenderness to the left lower quadrant will obtain a CT scan to rule out intra-abdominal infection or obstruction.  Urinalysis to check for urinary tract infection.  Atraumatic left lower extremity pain, DVT ultrasound to rule out DVT.  Doubt arterial insufficiency given neurovascular intact strong pedal pulses.  Given no infectious changes to the leg I doubt infection.   Fortunately the DVT scan is negative for blood clot.  Patient states that  she has radicular pain that is similar and requesting gabapentin which worked in the past so I gave some to her in the emergency department. Doubt emergent pathology like ischemia (pulses intact) or nec fasc (no infection on exam or pain out of proportion)   She is stable pending the results of her CT scan of the abdomen pelvis for any acute pathologies, if negative anticipate discharge home with PMD follow-up.  Patient with ongoing left lower extremity pain though I doubt emergent causes as above.  She states that she takes multiple pain medications and has decreased her dose of gabapentin lately and takes 100 mg 3 times a day currently.  She states that ever since decreasing this medication her pain has gradually worsened.  I advised that she take 200 mg 3 times a day and then follow-up with her doctor on Monday morning for further pain management.     Multiple visit notes from November 2023 reviewed including physical med and neurology notes indicating ongoing weakness/pain/spasticity of the L>R lower extremities despite medications, refractory to steroids in the past, and neuro note indicating risk of high dose steroids outweighs potential benefits. I will defer steroids for now and have her discuss further with her neuro/PMD. I spoke with her caregiver [name] about this plan and she will call neuro in the morning for ongoing pain management recs.   Patient's presentation is most consistent with acute presentation with potential threat to life or bodily function.       FINAL CLINICAL IMPRESSION(S) / ED DIAGNOSES   Final diagnoses:  Left leg pain  Generalized abdominal pain     Rx / DC Orders   ED Discharge Orders     None        Note:  This document was prepared using Dragon voice recognition software and may include unintentional dictation errors.    Lucillie Garfinkel, MD 09/13/22 Carly [name]    Lucillie Garfinkel, MD 09/13/22 Carly [name]    Lucillie Garfinkel, MD 09/13/22 2027

## 2022-09-13 NOTE — ED Triage Notes (Signed)
Pt in via EMS form home with c/o abd pain for 2 days. No distention or pain with palpation. Pt reports feel gassy and nauseated and has some nasal congestion. 150/92, HR 82, 96% RA, 12RR

## 2022-09-13 NOTE — ED Notes (Signed)
Called [facility] for transport back to pt home

## 2022-09-13 NOTE — ED Notes (Signed)
Bilateral feet have +3  edema. Patient  c/o the left leg hurting the most.

## 2022-09-13 NOTE — ED Provider Triage Note (Signed)
Emergency Medicine Provider Triage Evaluation Note  Carly [name] , a 71 y.o. female  was evaluated in triage.  Pt complains of multiple medical complaints. Abdominal pain with nausea x 4 days and worsening lower extremity pain for the past 2 days. History of MS. Not currently on steroid.   Physical Exam  There were no vitals taken for this visit. Gen:   Awake, no distress   Resp:  Normal effort  MSK:   Moves extremities without difficulty  Other:    Medical Decision Making  Medically screening exam initiated at 11:27 AM.  Appropriate orders placed.  Carly [name] was informed that the remainder of the evaluation will be completed by another provider, this initial triage assessment does not replace that evaluation, and the importance of remaining in the ED until their evaluation is complete.    Victorino Dike, FNP 09/13/22 1129

## 2022-09-15 ENCOUNTER — Encounter
Payer: Medicare Other | Attending: Physical Medicine and Rehabilitation | Admitting: Physical Medicine and Rehabilitation

## 2022-09-15 ENCOUNTER — Telehealth: Payer: Self-pay | Admitting: Neurology

## 2022-09-15 DIAGNOSIS — G35 Multiple sclerosis: Secondary | ICD-10-CM | POA: Diagnosis not present

## 2022-09-15 MED ORDER — GABAPENTIN 100 MG PO CAPS: ORAL_CAPSULE | ORAL | 5 refills | Status: DC

## 2022-09-15 MED ORDER — TOPIRAMATE 25 MG PO TABS: 25.0000 mg | ORAL_TABLET | Freq: Every evening | ORAL | 3 refills | Status: DC

## 2022-09-15 NOTE — Telephone Encounter (Signed)
Pt's caretaker reports pt went to ED on Saturday due to leg pain being unbearable.  The Dr there suggested there be an increase in pt's gabapentin (NEURONTIN) 100 MG capsule , he did not write a new Rx, not wanting to have too many hands involved.  The Dr just made the suggestion that Dr Felecia Shelling be called to discuss a increase in the medication.

## 2022-09-15 NOTE — Telephone Encounter (Signed)
Called Krista back. While in the hospital, they gave her morphine twice and ineffective. Gave '400mg'$  of gabapentin, which was effective.  Relayed Dr. Garth Bigness recommendation. Agreeable to plan. E-scribed updated rx to [facility]. They will call back if ineffective in managing sx.

## 2022-09-15 NOTE — Progress Notes (Addendum)
Subjective:    Patient ID: Carly [name], female    DOB: 25-Jun-1951, 71 y.o.   MRN: [id number]  HPI  An audio/video tele-health visit is felt to be the most appropriate encounter for this patient at this time. This is a follow up tele-visit via phone. The patient is at home. MD is at office. Prior to scheduling this appointment, our staff discussed the limitations of evaluation and management by telemedicine and the availability of in-person appointments. The patient expressed understanding and agreed to proceed.    Carly [name] is a 71 year old woman who presents for f/u of confusion, white matter periventricular CVA, presents for follow-up regarding lymphedema, anxiety,chronic fatigue, insomnia, OSA, and weight gain, and new lower extremity weakness today.    1) Anxiety:  -improved since Dr. Kerman Passey started her on Lorazepam .'5mg'$  at night, she continues to get benefit from this -she asks what else she can do for this.  [name] is trying to help her to stay more active -discussed that she is doing better with the Zoloft at '150mg'$   -discussed that she was displeased that her behavioral health provider on Friday was female, she would prefer to talk with a female -she let the company know this and they do ot have any availability with female providers for 6 months -she says that psychiatry denied her referral and stated that it was because she had dementia. She notes that she has never ben diagnosed with dementia.  -her psychiatrist previously transitioned her from Seroquel back to benzodiazepine due to side effect profile of Seroquel, but this worsened her anxiety so we restarted it. She asks if she can increase Seroquel to '25mg'$  -she needs a new order for Seroquel as her current one only accounts for nighttime dose but she is also taking one prn during the day.  -she is currently a little out of it from the anesthesia for her recent colonoscopy -anxiety has been very severe -she started Zoloft  this week and has not yet noted any benefit, she is a little discouraged as felt this should take effect sooner.  -she would like referral to Dr. Sonny Dandy who was recommended to her by [name], she has not yet heard from their office -she really likes following with [name] as well -she feels that her inability to see her grandkids is a trigger for her anxiety. She used to see them mmore in the past and does not know why she doesn;t get to any mores -she does not want to restart her [name] as she does not want the weight gain associated with this medication -she has only been sleeping 4 hours per night and she feels this worsens her anxiety.  -she has been having a lot of anxiety related to her and her husband's move from their current home into a retirement home and she asks whether she may take the seroquel additionally as needed- once in the morning, once later in the day, and two at night -she is interested in following with a behavioral therapist -she and husband [name] were scared of side effects of Seroquel that were discussed- she has not noted any this far.  -she has been sleeping but her legs feel very stiff at night -she continues to take Cymbalta '60mg'$  -we tried weaning her off the mirtazepine but she experienced weakness and insomnia and wanted to restart- titrated back up to her former dose.  -she discussed with her husband, who is also present on this call today,  and she would prefer to restart Seroquel and stop Lorazepam.  -she has not had any counseling and would like to restart this. She has an appointment with [name] in August and would like to start counseling earlier than this if possible.  -she loved speaking with [name] inpatient and would like to follow with him -she had symptoms of dizziness yesterday at the hairdresser and was asked to contact me regarding if this could be from the seroquel. She had had no other similar episodes since starting  it.  -she has been sleeping well with the Seroquel at night.  -she would like a list of foods that can help anxiety.  -she asks about the dietary advice I gave her last visit regarding anxiety. -she has been using essential oil.  -anxiety has been bad -off prednisone now -exercise helps her.  -she gets upset over things easily -she wants to be able to calm down.  -she needs refills of her cymbalta and [name]  2) MS: -her spasticity improved since Dr. Jennye Moccasin started her on 0.'5mg'$  Lorazepam.  -progressing -has been having a lot of stiffness recently, we had discssed increasing the baclofen and this does appear to have helped -EMS called twice due to difficulty getting her up off the floor -is receiving home therapy -she would like to see a different neurologist and asked for my recommendation   3) Lymphedema  -has not been using garment recently -has significant bilateral lower extremity  -has greatly improved with lymphedema therapy -she recently received a device that has helped so much that she was able to get off of Lasix.  -has ordered compression garments -does not eat processed foods or salt.  -she has ordered something from [employer] to give her lymphatic massage.  -improving but still very bothersome to her -discussed bloodwork today  4) HTN: -BP 138/79  5) Decreased appetite:  -resolved with increasing [name]  6) She developed pain in right side, and not sure if this is due to topamax or the antibiotics she is on to treat a respiratory infection.   7) Insomnia:  -sleeping poorly due to her CPAP mask, pressure is rising at night and this is causing mask leak. -sleeping about 5 hours per night and has been referred for another sleep study  -respiratory therapist will seeing her tomorrow.  -she asks about Inspire.  -she follows with [name] and she says he is also a sleep doctor -she hates to reduce the medicine.  -she has tried 5 different masks -the  machine is very loud.  -she can sleep without the CPAP fine.   8) Chest congestion -she has been having symptoms of congestion for greater than 3 months -Her father saw 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 She received a call to schedule an appointment with [name] office and is thankful for the referral.  -she asks what else she can do to help with this -she has been through multiple rounds of antibiotics without benefit.  -also feeling diffuse joint aches.   9) GERD -has been experiencing after eating  10) Sinus congestion -pleased that her CT results were normal.   11) OSA -she notes that she was found to have OSA on recent sleep study and she was tearful about this as she feels it is just another condition she has -sleeping 5 hours per night and has been referred for another sleep study -she was surprised as she  feels that she sleeps well.  -she has note been sleeping well while using the CPAP mask and was told that O2 is leaking -she is working with a respiratory therapist -she feels anxiety about whether she will get enough sleep tonight, she has only been sleeping about 4 hours since using the CPAP -she has decided she does not want to pursue Inspire yet until she has learned more about it.  -she had a very productive visit with [name] and he advised her to try her best to tolerate the CPAP mask, which she is doing.  -followed up with pulmonology regarding [name] and was told she would be a good candidate for this treatment, but she should prefer to continue trying CPAP mask before trying Inspire  12) Chronic fatigue -she was diagnosed with COVID 9 days ago and her breathing has improved but she has been feeling very fatigued -she has been taking the N-acetyl-cysteine supplement -she asks about taking injetable B12 as this really helped her son -she has not been wearing her CPAP as she is  waiting for a new mask  13) Pain -takes Tylenol and baclofen -would like to get Dysport next available appointment  14) Lower extremity weakness: -improved -went to ED and there was no evidence of stroke. Thought to be due to her Risperdal -her legs feel stiff -no other focal deficits -she started Risperdal on Wednesday night and asks if this could be causing her weakness -[name] has been trying to wean her off Mirtazapine and Seroquel due to weight gain and onto Risperdal for her anxiety  15) Bilateral lower extremity pain -feels stiff and tight like spasticity -she asks if she can take extra Baclofen  16) depression --she has been doing better on the '150mg'$  Zoloft -there is another medication she would also like to try -she has been having a hard time getting in touch with her psychiatrist regarding the suicidal ideation she has been experiencing -progressing  17) frequent falls -she says she is being admitted to [facility] due to recent frequent falls. -she has not had any more falls since admission  18) Nausea -needs refill of Zofran  19) Confusion: -[name] has noted increased confusion since she was started on Gabapentin '100mg'$  up to 4 times per day as needed and Trazodone was increased to '100mg'$  at night  Pain Inventory Average Pain 5 Pain Right Now 2 My pain is aching and no pain.  Sometimes spasm in legs at night because of MST  In the last 24 hours, has pain interfered with the following? General activity 4 Relation with others 7 Enjoyment of life 5  What TIME of day is your pain at its worst? night Sleep (in general) Fair  Pain is worse with: sitting and unsure Pain improves with: medication Relief from Meds: 5  Family History  Problem Relation Age of Onset   Arthritis Mother    Hypertension Mother    Macular degeneration Mother    Hypertension Father    Hyperlipidemia Father    Heart disease Maternal Grandfather    Diabetes Maternal Grandfather     Kidney disease Paternal Grandmother    Social History   Socioeconomic History   Marital status: Married    Spouse name: Engineer, technical sales   Number of children: 3   Years of education: 14   Highest education level: Associate degree: academic program  Occupational History   Not on file  Tobacco Use   Smoking status: Never   Smokeless tobacco: Never  Vaping Use   Vaping Use: Never used  Substance and Sexual Activity   Alcohol use: No    Alcohol/week: 0.0 standard drinks of alcohol   Drug use: No   Sexual activity: Not Currently  Other Topics Concern   Not on file  Social History Narrative   Lives with husband    Right handed   Caffeine: 1 cup of coffee in the AM, coke occa. Trying to come off caffeine    Social Determinants of Health   Financial Resource Strain: Low Risk  (01/07/2022)   Overall Financial Resource Strain (CARDIA)    Difficulty of Paying Living Expenses: Not hard at all  Food Insecurity: No Food Insecurity (01/07/2022)   Hunger Vital Sign    Worried About Running Out of Food in the Last Year: Never true    Ran Out of Food in the Last Year: Never true  Transportation Needs: No Transportation Needs (01/07/2022)   PRAPARE - Hydrologist (Medical): No    Lack of Transportation (Non-Medical): No  Physical Activity: Insufficiently Active (01/07/2022)   Exercise Vital Sign    Days of Exercise per Week: 2 days    Minutes of Exercise per Session: 60 min  Stress: No Stress Concern Present (01/07/2022)   [place]    Feeling of Stress : Not at all  Social Connections: Unknown (01/07/2022)   Social Connection and Isolation Panel [NHANES]    Frequency of Communication with Friends and Family: Not on file    Frequency of Social Gatherings with Friends and Family: Not on file    Attends Religious Services: Not on file    Active Member of Clubs or Organizations: Not on file    Attends English as a second language teacher Meetings: Not on file    Marital Status: Married   Past Surgical History:  Procedure Laterality Date   BACK SURGERY     CHOLECYSTECTOMY     COLONOSCOPY N/A 04/23/2022   Procedure: COLONOSCOPY;  Surgeon: Toledo, Benay Pike, MD;  Location: [facility] ENDOSCOPY;  Service: Gastroenterology;  Laterality: N/A;   COLONOSCOPY WITH PROPOFOL N/A 05/18/2017   Procedure: COLONOSCOPY WITH PROPOFOL;  Surgeon: [name], MD;  Location: [facility] ENDOSCOPY;  Service: Endoscopy;  Laterality: N/A;   FOOT SURGERY  2015   GALLBLADDER SURGERY  2008   HARDWARE REMOVAL Left 02/14/2016   Procedure: LEFT FOOT REMOVAL DEEP IMPLANT;  Surgeon: [name], MD;  Location: [place];  Service: Orthopedics;  Laterality: Left;   HERNIA REPAIR     Inguinal Hernia Repair   SPINE SURGERY  2014   Past Surgical History:  Procedure Laterality Date   BACK SURGERY     CHOLECYSTECTOMY     COLONOSCOPY N/A 04/23/2022   Procedure: COLONOSCOPY;  Surgeon: Toledo, Benay Pike, MD;  Location: [facility] ENDOSCOPY;  Service: Gastroenterology;  Laterality: N/A;   COLONOSCOPY WITH PROPOFOL N/A 05/18/2017   Procedure: COLONOSCOPY WITH PROPOFOL;  Surgeon: [name], MD;  Location: [facility] ENDOSCOPY;  Service: Endoscopy;  Laterality: N/A;   FOOT SURGERY  2015   GALLBLADDER SURGERY  2008   HARDWARE REMOVAL Left 02/14/2016   Procedure: LEFT FOOT REMOVAL DEEP IMPLANT;  Surgeon: [name], MD;  Location: [place];  Service: Orthopedics;  Laterality: Left;   HERNIA REPAIR     Inguinal Hernia Repair   SPINE SURGERY  2014   Past Medical History:  Diagnosis Date   Allergy  Anxiety    Aspiration pneumonia (HCC)    Depression    Frequent headaches    H/O   GERD (gastroesophageal reflux disease)    History of chicken pox    History of colon polyps    Hx of migraines    Hypertension    Lymphedema    Multiple sclerosis ([place]) 2011   OSA (obstructive sleep apnea)    PONV (postoperative nausea and  vomiting)    Scoliosis    There were no vitals taken for this visit.  Opioid Risk Score:   Fall Risk Score:  `1  Depression screen PHQ 2/9     05/22/2022    4:07 PM 04/09/2022   10:00 AM 01/07/2022    3:15 PM 11/05/2021   10:10 AM 08/27/2021   11:12 AM 08/15/2021   10:50 AM 07/19/2021    3:28 PM  Depression screen PHQ 2/9  Decreased Interest 3 1 0 0 2 0 1  Down, Depressed, Hopeless 3 1 0 1 2 0 1  PHQ - 2 Score 6 2 0 1 4 0 2  Altered sleeping 3 0       Tired, decreased energy 3 1       Change in appetite 3 0       Feeling bad or failure about yourself  3 0       Trouble concentrating 3 0       Moving slowly or fidgety/restless 0 0       Suicidal thoughts 1 0       PHQ-9 Score 22 3       Difficult doing work/chores Somewhat difficult Not difficult at all         Review of Systems  Constitutional: Negative.   HENT: Negative.    Eyes: Negative.   Respiratory:  Negative for cough.        Chest congestion  Cardiovascular: Negative.   Gastrointestinal: Negative.   Endocrine: Negative.   Genitourinary: Negative.   Musculoskeletal:  Positive for back pain and gait problem.  Skin: Negative.   Allergic/Immunologic: Negative.   Hematological:  Bruises/bleeds easily.       Plavix  Psychiatric/Behavioral:  Positive for dysphoric mood.   All other systems reviewed and are negative.      Objective:   Physical Exam Patient seen via phone     Assessment & Plan:  1) Anxiety: -discussed with patient the side effects of both Seroquel and Lorazepam, as well as potential interactions with other medications. -discussed benefits of eating animal products to increase serotonin levels -discussed benefits of exercise/movement throughout the day -will refer to ne psychologist and psychiatrist, indicated on referral because she prefers female provider -sent new referral to psychiatry and indicated on referral that patient has not been diagnosed with dementia -discussed improvements  since Dr. Kerman Passey started her on 0.'5mg'$  Lorazepam at night -recommended drinking chamomile tea with saffron every evening -advised that saffron can be purchased from the spices section of her grocery section and just 1 strand daily can be effective if mood elevation, and it is very good for health.  -recommended increasing Seroquel to '25mg'$  HS and adding an additional '25mg'$  during the day that can be taken once daily or split in half to be taken as 12.'5mg'$  BID PRN.  -discussed referral to psychiatry, placed referral to Dr. Sonny Dandy who was recommended to her by [name], discussed that it may be at least a week before she hears from them. -continue Zoloft, discussed that  this will take 2-4 weeks to have effect, reinforced that she may not feel benefit from this yet. Discussed that not everyone response to every medicine and there are alternative options we can try if this is not effective enough.  -discussed benefits of exercise and good nutrition -discussed her move because it is stressing her.  -discussed whether we should increase Seroquel due to her anxiety. Discussed that she is currently taking 12.'5mg'$  3 times per day PRN -discussed about her stress of seeing many different doctors -advised to always use the lowest dose of medications that she needs to minimize side effects -discussed the buspar that was started for her by Dr. Kerman Passey- she has not yet noted benefits from this, advised that she does not need to take it if she does not feel benefits.  -referred to psychiatry in [facility] for [facility]BT -reinforced importance of thinking positively.  -continue to encourage non-pharmacologic management approaches as well, such as meditation, applying lavender oil, and exercise- all of which she has been trying and which have been helping.  -discussed alternative anxiety medications.  -continue seroquel 12.'5mg'$  daily and '25mg'$  at night. Can decreased to '25mg'$  at night and assess positive/negative effects during  the day. Can restart 1/2 tab during the day if anxiety is uncontrolled.  -discontinue lorazepam.  -establish care with [name] for neuropsych counseling on 8/2. They really valued the appointment with him.  -discontinue topamax in case pain in right side is due to medication- discussed can also be due to antibiotics which she is taking for respiratory infection- discontinuing the topamax will help [place] know if it is the cause of her pain or not. -refilled [name] '30mg'$  HS.  -refilled Cymbalta '60mg'$  -Discussed exercise and meditation as tools to decrease anxiety. -Recommended Down Dog Yoga app -Discussed spending time outdoors. -Discussed positive re-framing of anxiety.  -Discussed the following foods that have been show to reduce anxiety: 1) [place] nuts, mushrooms, soy beans due to their high selenium content. Upper limit of toxicity of selenium is 485mg/day so no more than 3-4 bBolivianuts per day.  2) Fatty fish such as salmon, mackerel, sardines, trout, and herring- high in omega-3 fatty acids 3) Eggs- increases serotonin and dopamine 4) Pumpkin seeds- high in omega-3 fatty acids 5) dark chocolate- high in flavanols that increase blood flow to brain 6) turmeric- take with black pepper to increase absorption 7) chamomile tea- antioxidant and anti-inflammatory properties 8) yogurt without sugar- supports gut-brain axis 9) green tea- contains L- theanine 10) blueberries- high in vitamin C and antioxidants 11) t[place] high in tryptophan which gets converted to serotonin 12) bell peppers- rich in vitamin C and antioxidants 13) citrus fruits- rich in vitamin C and antioxidants 14) almonds- high in vitamin E and healthy fats 15) chia seeds- high in omega-3 fatty acids  2) MS -discussed improvements in anxiety since she was started on 0.'5mg'$  Lorazepam nightly  -continue home therapy to minimize stiffness/maximize mobility -f/u with MS specialist. Discussed her appointmenr with Dr.  SKerman Passey  -provided with neurology referral -discussed trying lipoic acid -discussed the Wahls protocol  3) HTN:  -discussed good blood pressure previously BP has been 130s-150s/80s, recommended citrus foods and nuts, as much mobility as she can tolerate, log Bps daily and bring log to f/u appointment.  -continue current regimen and checking and checking BP.  -advised citrus foods and nuts to help lower BP. Discussed that once BP is consistently 120/80 we can wean her Losartan to '25mg'$ .   4) Bilateral lower  extremity edema: - continue lymphedema therapy which is greatly helping.  -Lasix discontinued. Discussed that this should also improve her kidney function -advised low salt diet.  -prescribed new compression garments, will fax to company.  -discussed doing the compression garments early in the morning at 9am  -discussed that her kidney function is normal on last check in March.   5) Insomnia: -recommended tart cherry juice with dinner, valerian root and chamomile teas in evening, applying lavender drops to forehead at night. -recommended chamomile tea with saffron a couple hours before bedtime -continue [name] '30mg'$  HS- discussed that we will not try to wean off again since we saw how much this is benefittng her -discontinue amantadine.  -discussed considering the Inspire trial since he has tried the CPAP for a long time now and can not tolerate it.   6) Obesity BMI 33.13 -attempted to wean off [name] but she developed worsening insomnia, anxiety, and decreased appetite that was undesirable to her, so have restarted the medication.  -discussed trial of avoiding gluten for 3 months and how this could potentially help with her weight loss, anxiety, and prevention/slowing of her chronic diseases. -discussed benefits of intermittent fasting- made goal to set a consistent dinner time and avoid snacking after this time. If she is hungry, choose a healthy snack of nuts, fruit, or  vegetables.  -provided a link to a pdf of Pete Escogue's musculoskeletal alignment exercises: [email or web address]   7) Chest congestion -referred for pulmonary consult with [name], who treated her father for pulmonary fibrosis. She has made an appointment to establish care with him.  -recommended drinking daily warm water with honey, lime and ginger -recommended drinking tea with holy Basil (Tulsi)  8) Sore throat: -continue salt water gargling  9) Sinus congestion: -recommended humidifier -drink 6-8 glasses of water per day.   10) fatigue -checked B12, folate, TSH, vitamin D, magnesium and discussed bloodwork with patient and husband: all in normal range except for suboptimal Vitamin D- high dose supplement prescribed -Recommended continuing NAC (N-Acetyl cysteine) '600mg'$  BID for her fatigue. Discussed its benefits in boosting glutathione and mitochondrial function. -prescribed monthly injectable B12 as patient would like to try this. Discussed that there are few risks to having elevated B12 levels -discussed that not wearing her CPAP is also likely contributing to her fatigue -discussed modafinil but she prefers to try the B12 first   11) OSA -discussed that OSA can contribute to daytime fatigue, weight gain, anxiety, and chronic conditions so it is good to get this condition treated. -recommended follow-up sleep study for re-eval -referred to Dr. Melida Quitter for eval for [facility] given that she is having a hard time sleeping with her CPAP. Discussed her decision not to pursue Inspire at this time and instead try to tolerate her current mask as best as possible. Discussed her positive conversation with [name]. -advised her to talk to her respiratory therapist about fitting another mask -discussed Inspire but she says unfortunately it is not covered by insurance.  -discussed strap under the chin.  -discussed  following up with pulmonology for Inspire.  -agreed with her decision to continue to try CPAP before pursuing Inspire due to her fears of the procedure  12) Low back pain: -recommended doTerra Deep Blue Essential oil and applied to area of pain today- discussed that this is made of natural plant oils. Shared by personal experience of benefit from use of this essential oil -provided a link to a pdf of Laurey Arrow Escogue's musculoskeletal alignment  exercises: [email or web address]   13) Lower extremity weakness: -discussed that this could be a possible side effect of the Risperdal.  -Recommended calling Dr. Garth Bigness office to let him know about the lower extremity weakness -discussed that she should go to the ED as unilateral lower extremity weakness could be a symptom of stroke or MS flare -discussed stopping Risperdal until she can talk with [name] -Discussed using Seroquel prn for anxiety in the meantime  14) Depression -discussed decreasing Zoloft dose back to '150mg'$  since this dose had been working well for her and since she has been experiencing suicidal ideations with higher dose -discussed that suicidal ideations are common side effects of depression medications, that sometimes these medications can work effectively to treat depression but other times they give patients the energy to act on negative thoughts -encouraged that she call her kids to let them know how important it is to her to be able to see her grandchildren -discussed ECT with her caregiver -discussed diet rich in protein/fruits/vegetables/and low in processed foods  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 -recommended heat, range of motion, use of baclofen -will plan for 500U Dysport next visit -recommended increasing Baclofen to '15mg'$  TID PRN  16) Frequent falls -discussed that her facility is planning to send her to her facility's SNF -discussed that she will need to have NFL2  form filled by her PCP -discussed that the rehab and exercise she gets there will also help with her depression  17) Nausea -refilled zofran  18) Suicidal thoughts -discussed that suicidal thoughts have decreased since decreasing Zoloft to '150mg'$  -discussed follow-up with behavioral health on Friday  19) Confusion: -discussed that both gabapentin and trazodone can cause confusion -discussed that if she would like to taper off these medications she can decrease Trazodone to '50mg'$  at night and decrease Gabapentin by 1 tablet per day  6 minutes spent in discussion of her depression, progressing MS, Wahls protocol, advising diets rich in protein, fruits, and vegetables, trying Topamax

## 2022-09-15 NOTE — Addendum Note (Signed)
Addended by: Wyvonnia Lora on: 09/15/2022 09:35 AM   Modules accepted: Orders

## 2022-09-16 ENCOUNTER — Other Ambulatory Visit: Payer: Self-pay | Admitting: Internal Medicine

## 2022-09-16 DIAGNOSIS — R2681 Unsteadiness on feet: Secondary | ICD-10-CM | POA: Diagnosis not present

## 2022-09-16 DIAGNOSIS — R262 Difficulty in walking, not elsewhere classified: Secondary | ICD-10-CM | POA: Diagnosis not present

## 2022-09-16 DIAGNOSIS — R41841 Cognitive communication deficit: Secondary | ICD-10-CM | POA: Diagnosis not present

## 2022-09-16 DIAGNOSIS — R279 Unspecified lack of coordination: Secondary | ICD-10-CM | POA: Diagnosis not present

## 2022-09-16 DIAGNOSIS — M6281 Muscle weakness (generalized): Secondary | ICD-10-CM | POA: Diagnosis not present

## 2022-09-16 DIAGNOSIS — G35 Multiple sclerosis: Secondary | ICD-10-CM | POA: Diagnosis not present

## 2022-09-17 DIAGNOSIS — H25813 Combined forms of age-related cataract, bilateral: Secondary | ICD-10-CM | POA: Diagnosis not present

## 2022-09-17 NOTE — Telephone Encounter (Signed)
Ok to refill losartan if taking as directed.  Need to clarify medication taking.  Per note, off lasix?

## 2022-09-18 DIAGNOSIS — M6281 Muscle weakness (generalized): Secondary | ICD-10-CM | POA: Diagnosis not present

## 2022-09-18 DIAGNOSIS — R279 Unspecified lack of coordination: Secondary | ICD-10-CM | POA: Diagnosis not present

## 2022-09-18 DIAGNOSIS — G35 Multiple sclerosis: Secondary | ICD-10-CM | POA: Diagnosis not present

## 2022-09-18 DIAGNOSIS — R262 Difficulty in walking, not elsewhere classified: Secondary | ICD-10-CM | POA: Diagnosis not present

## 2022-09-18 DIAGNOSIS — R2681 Unsteadiness on feet: Secondary | ICD-10-CM | POA: Diagnosis not present

## 2022-09-18 DIAGNOSIS — R41841 Cognitive communication deficit: Secondary | ICD-10-CM | POA: Diagnosis not present

## 2022-09-19 DIAGNOSIS — R279 Unspecified lack of coordination: Secondary | ICD-10-CM | POA: Diagnosis not present

## 2022-09-19 DIAGNOSIS — G35 Multiple sclerosis: Secondary | ICD-10-CM | POA: Diagnosis not present

## 2022-09-19 DIAGNOSIS — R262 Difficulty in walking, not elsewhere classified: Secondary | ICD-10-CM | POA: Diagnosis not present

## 2022-09-19 DIAGNOSIS — M6281 Muscle weakness (generalized): Secondary | ICD-10-CM | POA: Diagnosis not present

## 2022-09-19 DIAGNOSIS — R2681 Unsteadiness on feet: Secondary | ICD-10-CM | POA: Diagnosis not present

## 2022-09-19 DIAGNOSIS — R41841 Cognitive communication deficit: Secondary | ICD-10-CM | POA: Diagnosis not present

## 2022-09-19 NOTE — Telephone Encounter (Signed)
Pt husband returned call

## 2022-09-19 NOTE — Telephone Encounter (Signed)
LM for Krista to cb  LM for Bill to cb

## 2022-09-19 NOTE — Telephone Encounter (Signed)
LM for bill

## 2022-09-22 DIAGNOSIS — R2681 Unsteadiness on feet: Secondary | ICD-10-CM | POA: Diagnosis not present

## 2022-09-22 DIAGNOSIS — R279 Unspecified lack of coordination: Secondary | ICD-10-CM | POA: Diagnosis not present

## 2022-09-22 DIAGNOSIS — R41841 Cognitive communication deficit: Secondary | ICD-10-CM | POA: Diagnosis not present

## 2022-09-22 DIAGNOSIS — M6281 Muscle weakness (generalized): Secondary | ICD-10-CM | POA: Diagnosis not present

## 2022-09-22 DIAGNOSIS — R262 Difficulty in walking, not elsewhere classified: Secondary | ICD-10-CM | POA: Diagnosis not present

## 2022-09-22 DIAGNOSIS — G35 Multiple sclerosis: Secondary | ICD-10-CM | POA: Diagnosis not present

## 2022-09-22 NOTE — Telephone Encounter (Signed)
Per [name]- pt is NOT currently taking Lasix

## 2022-09-22 NOTE — Telephone Encounter (Signed)
Patient's husband, Lunetta Marina, called back to state he is returning call from Den[facility] Lung, [place].  I read Dr. Randell Patient Scott's note to [place].  [name] states patient is not taking lasix.  [name] states patient is doing pill pockets each week.  Bill states patient is taking the following:  losartan (COZAAR) 50 MG tablet amLODipine (NORVASC) 5 MG tablet baclofen (LIORESAL) 10 MG tablet clopidogrel (PLAVIX) 75 MG tablet docusate sodium (COLACE) 100 MG capsule busPIRone (BUSPAR) 15 MG tablet dalfampridine 10 MG TB12 Deplin 15 MG Dalfampridine 10 MG Loratadine 10 MG furosemide (LASIX) 20 MG tablet pantoprazole (PROTONIX) 40 MG tablet LORazepam (ATIVAN) 0.5 MG tablet gabapentin (NEURONTIN) 100 MG capsule saccharomyces boulardii (FLORASTOR) 250 MG capsule Vitamin D sertraline (ZOLOFT) 100 MG tablet  Magnesium Glycinate 400 MG rosuvastatin (CRESTOR) 5 MG tablet  Melatonin 10 MG topiramate (TOPAMAX) 25 MG tablet Bill states this list is not complete.  [name] states he will pick up patient's pills and most updated list in the morning and he will bring it by for [place].

## 2022-09-23 ENCOUNTER — Telehealth: Payer: Self-pay

## 2022-09-23 DIAGNOSIS — G35 Multiple sclerosis: Secondary | ICD-10-CM | POA: Diagnosis not present

## 2022-09-23 DIAGNOSIS — R2681 Unsteadiness on feet: Secondary | ICD-10-CM | POA: Diagnosis not present

## 2022-09-23 DIAGNOSIS — M6281 Muscle weakness (generalized): Secondary | ICD-10-CM | POA: Diagnosis not present

## 2022-09-23 DIAGNOSIS — R279 Unspecified lack of coordination: Secondary | ICD-10-CM | POA: Diagnosis not present

## 2022-09-23 DIAGNOSIS — R262 Difficulty in walking, not elsewhere classified: Secondary | ICD-10-CM | POA: Diagnosis not present

## 2022-09-23 DIAGNOSIS — R41841 Cognitive communication deficit: Secondary | ICD-10-CM | POA: Diagnosis not present

## 2022-09-23 NOTE — Telephone Encounter (Signed)
I placed list in Dr. Randell Patient Scott's color folder up front.

## 2022-09-23 NOTE — Telephone Encounter (Signed)
Patient's husband, Brayleigh Rybacki, dropped off a complete list of patient's current medications.

## 2022-09-25 DIAGNOSIS — R2681 Unsteadiness on feet: Secondary | ICD-10-CM | POA: Diagnosis not present

## 2022-09-25 DIAGNOSIS — G35 Multiple sclerosis: Secondary | ICD-10-CM | POA: Diagnosis not present

## 2022-09-25 DIAGNOSIS — R262 Difficulty in walking, not elsewhere classified: Secondary | ICD-10-CM | POA: Diagnosis not present

## 2022-09-25 DIAGNOSIS — R41841 Cognitive communication deficit: Secondary | ICD-10-CM | POA: Diagnosis not present

## 2022-09-25 DIAGNOSIS — M6281 Muscle weakness (generalized): Secondary | ICD-10-CM | POA: Diagnosis not present

## 2022-09-25 DIAGNOSIS — R279 Unspecified lack of coordination: Secondary | ICD-10-CM | POA: Diagnosis not present

## 2022-09-25 NOTE — Telephone Encounter (Signed)
Meds list updated and placed in scan pile

## 2022-09-29 DIAGNOSIS — R262 Difficulty in walking, not elsewhere classified: Secondary | ICD-10-CM | POA: Diagnosis not present

## 2022-09-29 DIAGNOSIS — G35 Multiple sclerosis: Secondary | ICD-10-CM | POA: Diagnosis not present

## 2022-09-29 DIAGNOSIS — R279 Unspecified lack of coordination: Secondary | ICD-10-CM | POA: Diagnosis not present

## 2022-09-29 DIAGNOSIS — M6281 Muscle weakness (generalized): Secondary | ICD-10-CM | POA: Diagnosis not present

## 2022-09-29 DIAGNOSIS — R2681 Unsteadiness on feet: Secondary | ICD-10-CM | POA: Diagnosis not present

## 2022-09-29 DIAGNOSIS — R41841 Cognitive communication deficit: Secondary | ICD-10-CM | POA: Diagnosis not present

## 2022-09-30 ENCOUNTER — Telehealth (INDEPENDENT_AMBULATORY_CARE_PROVIDER_SITE_OTHER): Payer: Medicare Other | Admitting: Adult Health

## 2022-09-30 ENCOUNTER — Encounter: Payer: Self-pay | Admitting: Adult Health

## 2022-09-30 VITALS — Temp 98.4°F | Ht 63.0 in | Wt 172.0 lb

## 2022-09-30 DIAGNOSIS — R062 Wheezing: Secondary | ICD-10-CM | POA: Diagnosis not present

## 2022-09-30 DIAGNOSIS — R41841 Cognitive communication deficit: Secondary | ICD-10-CM | POA: Diagnosis not present

## 2022-09-30 DIAGNOSIS — R279 Unspecified lack of coordination: Secondary | ICD-10-CM | POA: Diagnosis not present

## 2022-09-30 DIAGNOSIS — M6281 Muscle weakness (generalized): Secondary | ICD-10-CM | POA: Diagnosis not present

## 2022-09-30 DIAGNOSIS — J019 Acute sinusitis, unspecified: Secondary | ICD-10-CM | POA: Diagnosis not present

## 2022-09-30 DIAGNOSIS — R2681 Unsteadiness on feet: Secondary | ICD-10-CM | POA: Diagnosis not present

## 2022-09-30 DIAGNOSIS — R051 Acute cough: Secondary | ICD-10-CM | POA: Diagnosis not present

## 2022-09-30 DIAGNOSIS — R262 Difficulty in walking, not elsewhere classified: Secondary | ICD-10-CM | POA: Diagnosis not present

## 2022-09-30 DIAGNOSIS — G35 Multiple sclerosis: Secondary | ICD-10-CM | POA: Diagnosis not present

## 2022-09-30 MED ORDER — BENZONATATE 200 MG PO CAPS: 200.0000 mg | ORAL_CAPSULE | Freq: Two times a day (BID) | ORAL | 0 refills | Status: DC | PRN

## 2022-09-30 MED ORDER — PREDNISONE 10 MG PO TABS: 10.0000 mg | ORAL_TABLET | Freq: Every day | ORAL | 0 refills | Status: DC

## 2022-09-30 MED ORDER — DOXYCYCLINE HYCLATE 100 MG PO CAPS: 100.0000 mg | ORAL_CAPSULE | Freq: Two times a day (BID) | ORAL | 0 refills | Status: DC

## 2022-09-30 NOTE — Progress Notes (Signed)
Virtual Visit via Video Note  I connected with Carly [name] on 09/30/22 at  5:00 PM EST by a video enabled telemedicine application and verified that I am speaking with the correct person using two identifiers.  Location patient: home Location provider:work or home office Persons participating in the virtual visit: patient, provider  I discussed the limitations of evaluation and management by telemedicine and the availability of in person appointments. The patient expressed understanding and agreed to proceed.   HPI: 71 year old female who  has a past medical history of Allergy, Anxiety, Aspiration pneumonia ([place]), Depression, Frequent headaches, GERD (gastroesophageal reflux disease), History of chicken pox, History of colon polyps, migraines, Hypertension, Lymphedema, Multiple sclerosis ([place]) (2011), OSA (obstructive sleep apnea), PONV (postoperative nausea and vomiting), and Scoliosis.  She is being evaluated semi productive cough x 2 weeks. Associated symptoms include that of wheezing, sinus pain and pressure rhinorrhea, chills, but no fevers, headaches, and joint pain.   She took a covid test a couple of days ago and that was negative   At home she has been using her albuterol inhaler which helped with the wheezing.   ROS: See pertinent positives and negatives per HPI.  Past Medical History:  Diagnosis Date   Allergy    Anxiety    Aspiration pneumonia ([facility])    Depression    Frequent headaches    H/O   GERD (gastroesophageal reflux disease)    History of chicken pox    History of colon polyps    Hx of migraines    Hypertension    Lymphedema    Multiple sclerosis ([place]) 2011   OSA (obstructive sleep apnea)    PONV (postoperative nausea and vomiting)    Scoliosis     Past Surgical History:  Procedure Laterality Date   BACK SURGERY     CHOLECYSTECTOMY     COLONOSCOPY N/A 04/23/2022   Procedure: COLONOSCOPY;  Surgeon: Toledo, Benay Pike, MD;  Location: [facility] ENDOSCOPY;   Service: Gastroenterology;  Laterality: N/A;   COLONOSCOPY WITH PROPOFOL N/A 05/18/2017   Procedure: COLONOSCOPY WITH PROPOFOL;  Surgeon: [name], MD;  Location: [facility] ENDOSCOPY;  Service: Endoscopy;  Laterality: N/A;   FOOT SURGERY  2015   GALLBLADDER SURGERY  2008   HARDWARE REMOVAL Left 02/14/2016   Procedure: LEFT FOOT REMOVAL DEEP IMPLANT;  Surgeon: [name], MD;  Location: [facility];  Service: Orthopedics;  Laterality: Left;   HERNIA REPAIR     Inguinal Hernia Repair   SPINE SURGERY  2014    Family History  Problem Relation Age of Onset   Arthritis Mother    Hypertension Mother    Macular degeneration Mother    Hypertension Father    Hyperlipidemia Father    Heart disease Maternal Grandfather    Diabetes Maternal Grandfather    Kidney disease Paternal Grandmother        Current Outpatient Medications:    acetaminophen (TYLENOL) 500 MG tablet, Take 1 tablet (500 mg total) by mouth every 4 (four) hours as needed., Disp: 30 tablet, Rfl: 0   albuterol (VENTOLIN HFA) 108 (90 Base) MCG/ACT inhaler, Inhale 2 puffs into the lungs every 6 (six) hours as needed., Disp: 18 g, Rfl: 0   amLODipine (NORVASC) 5 MG tablet, TAKE 1 TABLET BY MOUTH DAILY., Disp: 90 tablet, Rfl: 1   azelastine (ASTELIN) 0.1 % nasal spray, SMARTSIG:1-2 Spray(s) Both Nares Twice Daily, Disp: , Rfl:    baclofen (LIORESAL) 10 MG tablet, Takes '10mg'$  q  4 hours, Disp: 180 each, Rfl: 5   busPIRone (BUSPAR) 15 MG tablet, Take 1 tablet (15 mg total) by mouth 2 (two) times daily. (Patient taking differently: Take 15 mg by mouth 3 (three) times daily.), Disp: 60 tablet, Rfl: 5   Cholecalciferol (D3-1000 PO), Take 2,000 Units by mouth daily., Disp: , Rfl:    clopidogrel (PLAVIX) 75 MG tablet, TAKE 1 TABLET (75 MG) BY MOUTH EVERY DAY, Disp: 90 tablet, Rfl: 1   cyanocobalamin (,VITAMIN B-12,) 1000 MCG/ML injection, Inject 1 mL (1,000 mcg total) into the muscle every 30 (thirty) days., Disp: 1 mL, Rfl:  3   cyclobenzaprine (FLEXERIL) 5 MG tablet, Take 1 tablet (5 mg total) by mouth in the morning and at bedtime., Disp: 60 tablet, Rfl: 5   dalfampridine 10 MG TB12, Take 1 tablet (10 mg total) by mouth every 12 (twelve) hours., Disp: 180 tablet, Rfl: 3   docusate sodium (COLACE) 100 MG capsule, Take 100 mg by mouth 2 (two) times daily., Disp: , Rfl:    fluticasone (FLONASE) 50 MCG/ACT nasal spray, Place into the nose., Disp: , Rfl:    furosemide (LASIX) 20 MG tablet, Take 20 mg by mouth daily., Disp: , Rfl:    gabapentin (NEURONTIN) 100 MG capsule, Take '200mg'$  in the morning, '200mg'$  in the afternoon and '200mg'$  at bedtime., Disp: 180 capsule, Rfl: 5   LORazepam (ATIVAN) 0.5 MG tablet, Take 1 tablet (0.5 mg total) by mouth every 8 (eight) hours., Disp: 90 tablet, Rfl: 3   losartan (COZAAR) 50 MG tablet, TAKE 1 TABLET BY MOUTH DAILY, Disp: 90 tablet, Rfl: 1   magnesium oxide (MAG-OX) 400 MG tablet, TAKE 1 TABLET BY MOUTH DAILY, Disp: 30 tablet, Rfl: 5   METAMUCIL FIBER PO, Take by mouth 2 (two) times daily., Disp: , Rfl:    modafinil (PROVIGIL) 200 MG tablet, Take 1 tablet in the morning and another tablet 4-5 hours later, Disp: 60 tablet, Rfl: 3   nystatin cream (MYCOSTATIN), Apply 1 application topically 2 (two) times daily., Disp: 30 g, Rfl: 11   ondansetron (ZOFRAN) 4 MG tablet, Take 1 tablet (4 mg total) by mouth every 8 (eight) hours as needed for nausea or vomiting., Disp: 20 tablet, Rfl: 0   pantoprazole (PROTONIX) 40 MG tablet, TWICE DAILY AT 7AM AND 5PM, Disp: 62 tablet, Rfl: 1   rosuvastatin (CRESTOR) 5 MG tablet, TAKE 1 TABLET BY MOUTH EVERY MONDAY, WEDNESDAY AND FRIDAY., Disp: 90 tablet, Rfl: 1   saccharomyces boulardii (FLORASTOR) 250 MG capsule, Take 1 capsule (250 mg total) by mouth 2 (two) times daily., Disp: 60 capsule, Rfl: 0   sertraline (ZOLOFT) 100 MG tablet, Take 150 mg by mouth daily., Disp: , Rfl:    sodium chloride (OCEAN) 0.65 % nasal spray, Place 1-2 sprays into the nose as  needed., Disp: , Rfl:    topiramate (TOPAMAX) 25 MG tablet, Take 1 tablet (25 mg total) by mouth at bedtime., Disp: 90 tablet, Rfl: 3 No current facility-administered medications for this visit.  Facility-Administered Medications Ordered in Other Visits:    0.9 %  sodium chloride infusion, , Intravenous, Continuous, Corcoran, Melissa C, MD, Last Rate: 10 mL/hr at 04/23/22 1127, Continued from Pre-op at 04/23/22 1127   acetaminophen (TYLENOL) tablet 650 mg, 650 mg, Oral, Once, Corcoran, Melissa C, MD  EXAM:  VITALS per patient if applicable:  GENERAL: alert, oriented, appears well and in no acute distress  HEENT: atraumatic, conjunttiva clear, no obvious abnormalities on inspection of external nose and  ears  NECK: normal movements of the head and neck  LUNGS: on inspection no signs of respiratory distress, breathing rate appears normal, no obvious gross SOB, gasping or wheezing  CV: no obvious cyanosis  MS: moves all visible extremities without noticeable abnormality  PSYCH/NEURO: pleasant and cooperative, no obvious depression or anxiety, speech and thought processing grossly intact  ASSESSMENT AND PLAN:  Discussed the following assessment and plan:  1. Acute non-recurrent sinusitis, unspecified location - Will treat due to symptoms and duration. Advised follow up if not improving in the next 2-3 days  - doxycycline (VIBRAMYCIN) 100 MG capsule; Take 1 capsule (100 mg total) by mouth 2 (two) times daily.  Dispense: 14 capsule; Refill: 0  2. Wheezing  - predniSONE (DELTASONE) 10 MG tablet; Take 1 tablet (10 mg total) by mouth daily with breakfast.  Dispense: 7 tablet; Refill: 0  3. Acute cough  - benzonatate (TESSALON) 200 MG capsule; Take 1 capsule (200 mg total) by mouth 2 (two) times daily as needed for cough.  Dispense: 20 capsule; Refill: 0      I discussed the assessment and treatment plan with the patient. The patient was provided an opportunity to ask questions and  all were answered. The patient agreed with the plan and demonstrated an understanding of the instructions.   The patient was advised to call back or seek an in-person evaluation if the symptoms worsen or if the condition fails to improve as anticipated.   Dorothyann Peng, NP

## 2022-10-02 ENCOUNTER — Telehealth: Payer: Self-pay | Admitting: Physical Medicine and Rehabilitation

## 2022-10-02 NOTE — Telephone Encounter (Signed)
Caregiver called because topamax is making Carly [name] nauseous. She is asking if they should cut in half, take at different time, or will it adventually go away. Please advise

## 2022-10-03 DIAGNOSIS — G35 Multiple sclerosis: Secondary | ICD-10-CM | POA: Diagnosis not present

## 2022-10-03 DIAGNOSIS — R2681 Unsteadiness on feet: Secondary | ICD-10-CM | POA: Diagnosis not present

## 2022-10-03 DIAGNOSIS — M6281 Muscle weakness (generalized): Secondary | ICD-10-CM | POA: Diagnosis not present

## 2022-10-03 DIAGNOSIS — R279 Unspecified lack of coordination: Secondary | ICD-10-CM | POA: Diagnosis not present

## 2022-10-03 DIAGNOSIS — R41841 Cognitive communication deficit: Secondary | ICD-10-CM | POA: Diagnosis not present

## 2022-10-03 DIAGNOSIS — R262 Difficulty in walking, not elsewhere classified: Secondary | ICD-10-CM | POA: Diagnosis not present

## 2022-10-07 IMAGING — CR DG CHEST 2V
2 series · 2 of 2 positions shown · non-contrast
Comparison: 10/28/2021

CLINICAL DATA: Weakness.

EXAM:
CHEST - 2 VIEW

[chest lat]
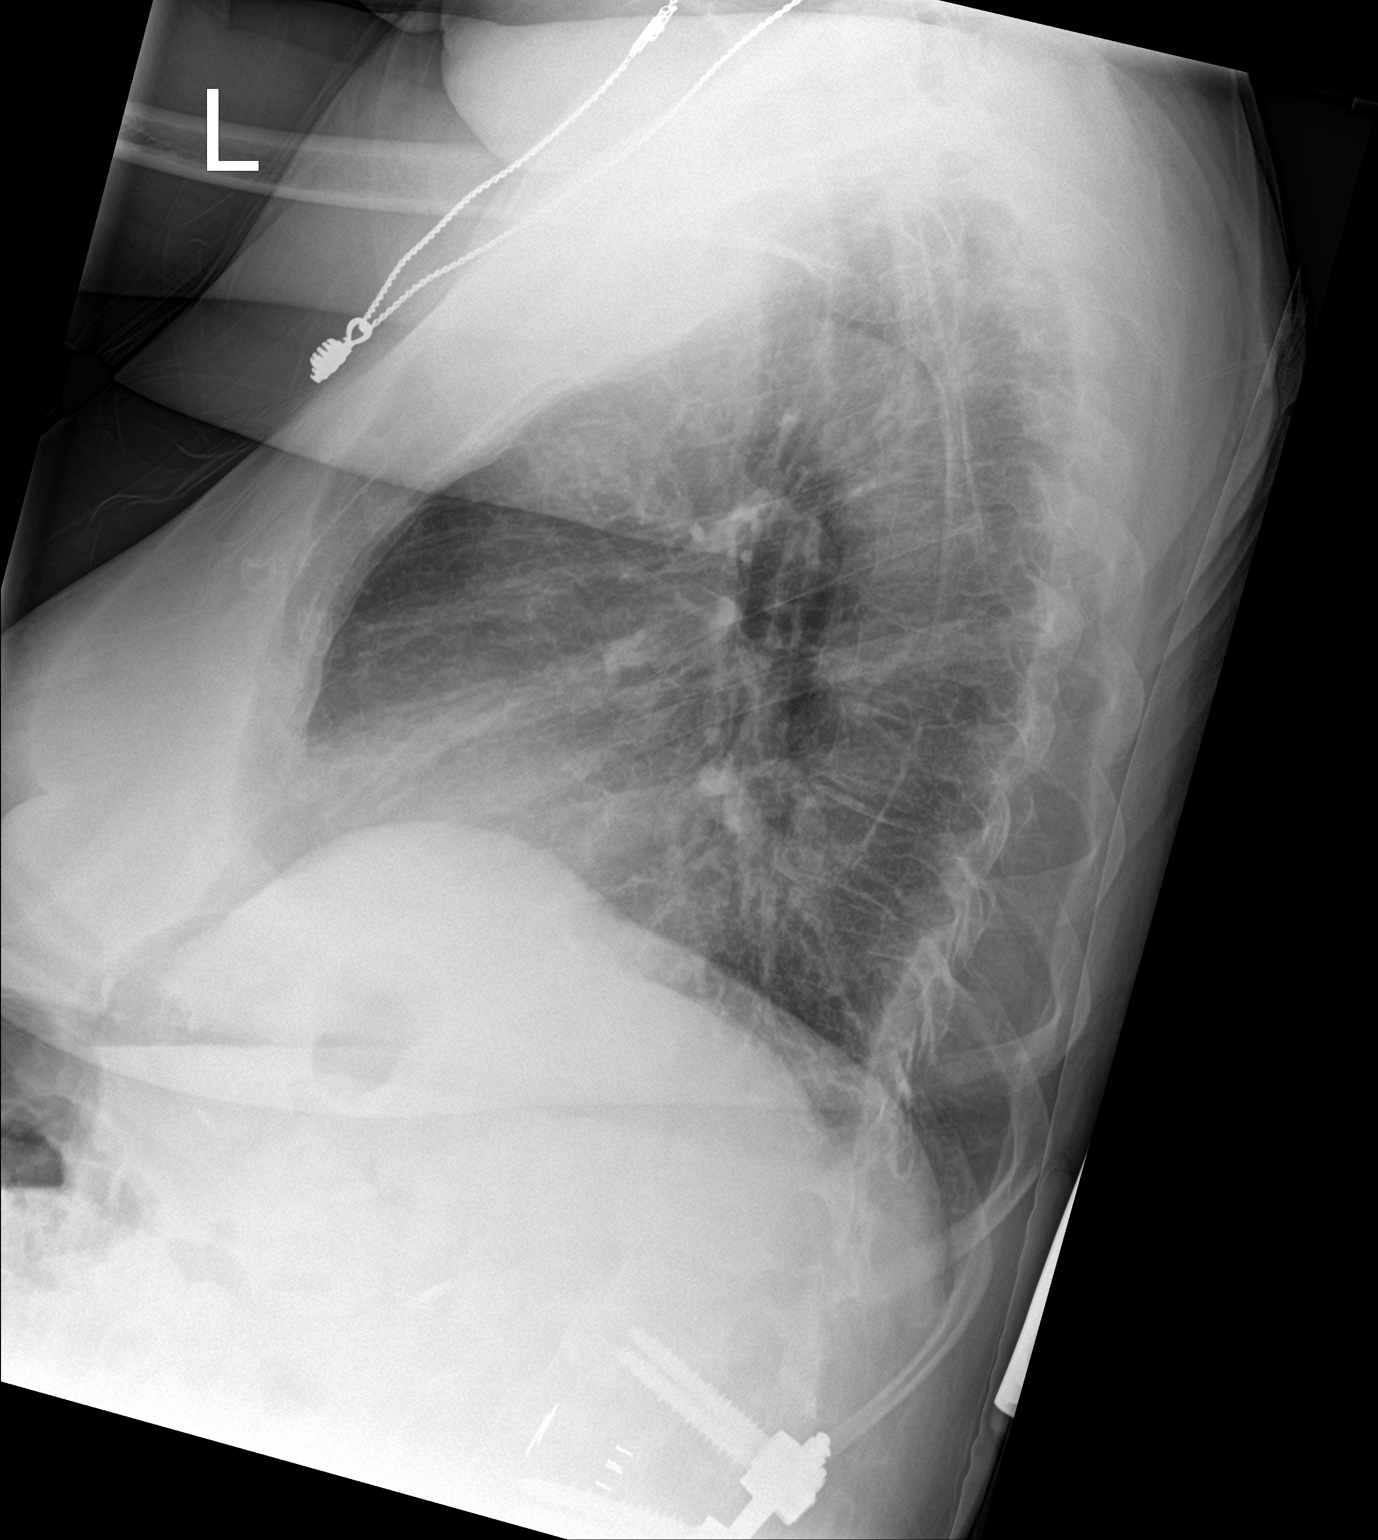

[chest ap]
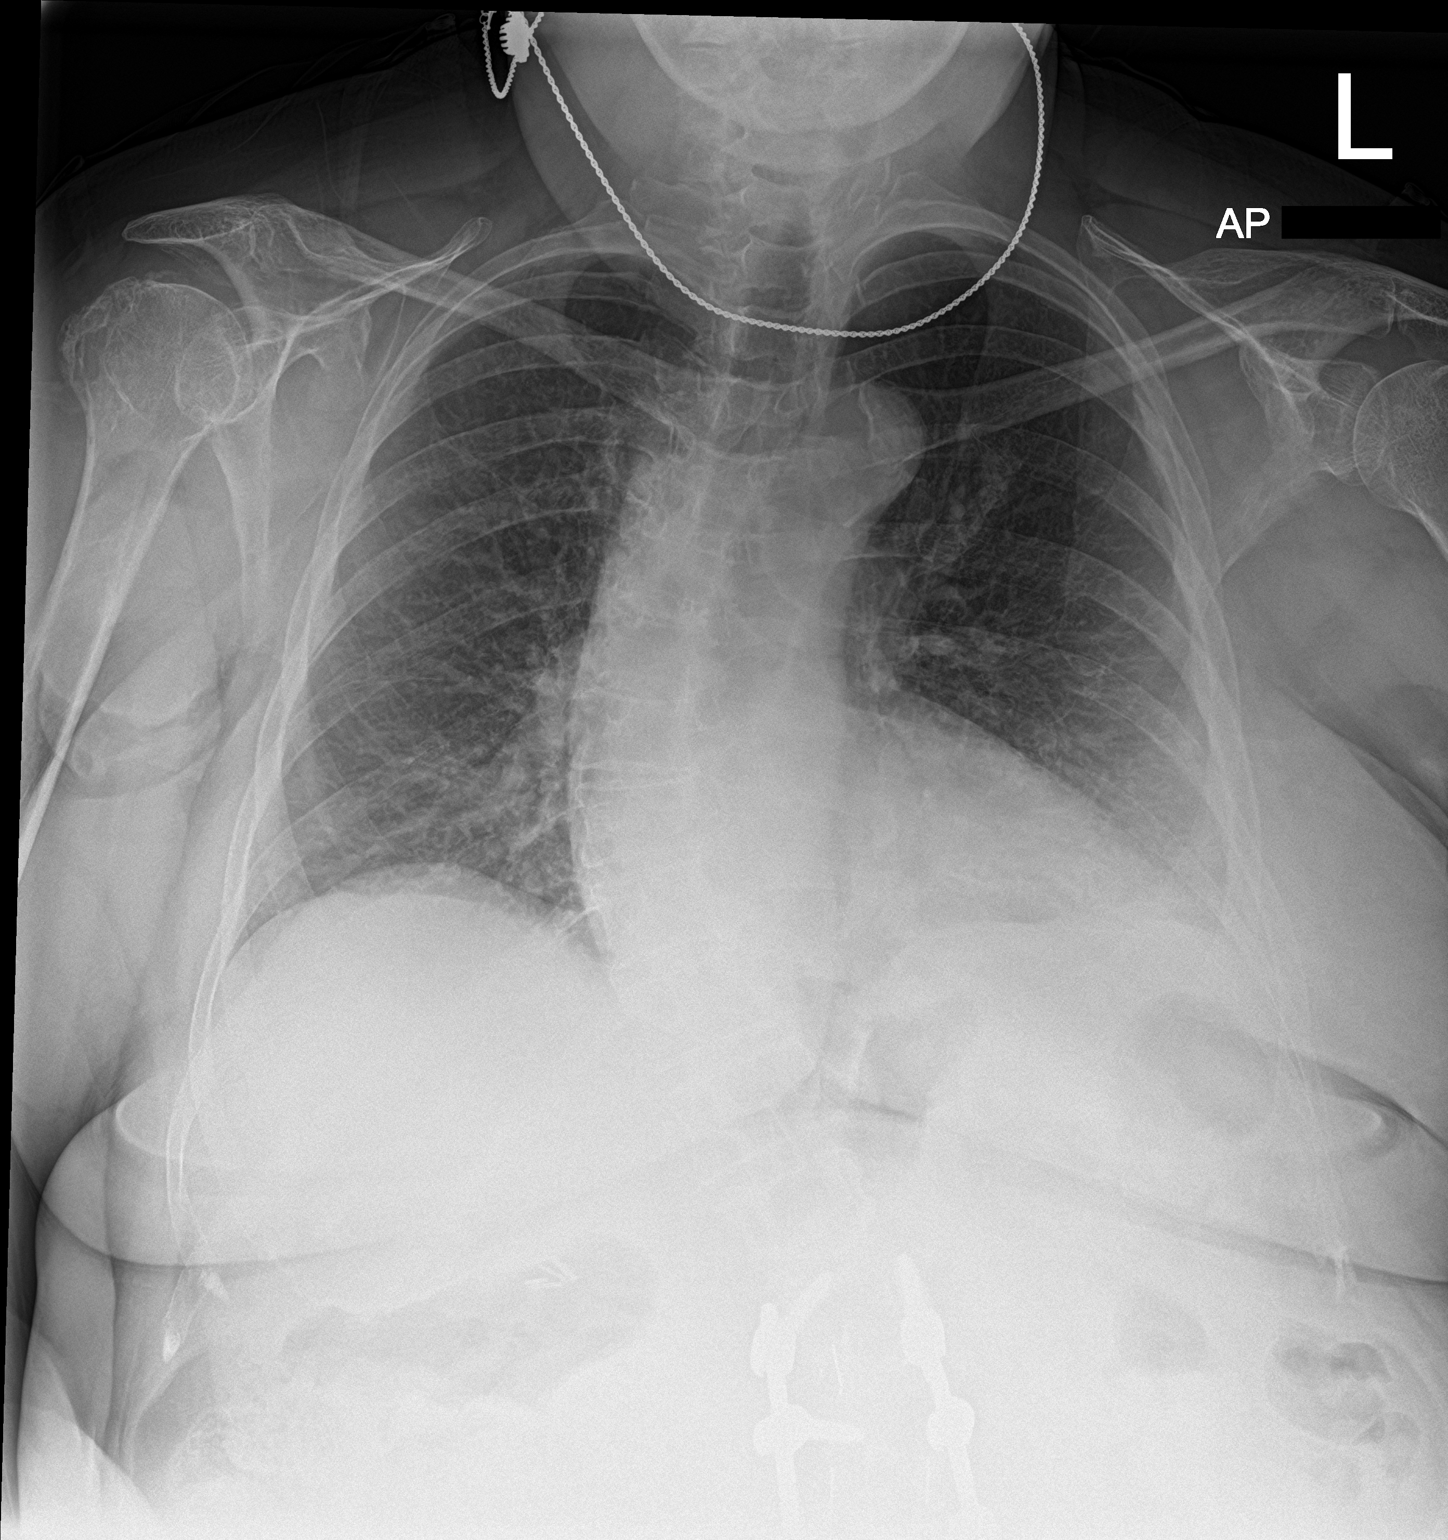

[2 of 2 positions shown; findings below may reference images not displayed]

FINDINGS: Lower lung volumes from prior exam. Stable heart size and
mediastinal contours. Mild aortic atherosclerosis no confluent
consolidation, pleural effusion, pulmonary edema or pneumothorax.
Scoliotic curvature in the spine. Lumbar surgical hardware is
partially included.
IMPRESSION: Low lung volumes without acute abnormality.

## 2022-10-08 ENCOUNTER — Other Ambulatory Visit: Payer: Self-pay | Admitting: Internal Medicine

## 2022-10-08 DIAGNOSIS — G35 Multiple sclerosis: Secondary | ICD-10-CM | POA: Diagnosis not present

## 2022-10-08 DIAGNOSIS — M6281 Muscle weakness (generalized): Secondary | ICD-10-CM | POA: Diagnosis not present

## 2022-10-08 DIAGNOSIS — R2681 Unsteadiness on feet: Secondary | ICD-10-CM | POA: Diagnosis not present

## 2022-10-08 DIAGNOSIS — R41841 Cognitive communication deficit: Secondary | ICD-10-CM | POA: Diagnosis not present

## 2022-10-08 DIAGNOSIS — R279 Unspecified lack of coordination: Secondary | ICD-10-CM | POA: Diagnosis not present

## 2022-10-08 DIAGNOSIS — R262 Difficulty in walking, not elsewhere classified: Secondary | ICD-10-CM | POA: Diagnosis not present

## 2022-10-10 DIAGNOSIS — R2681 Unsteadiness on feet: Secondary | ICD-10-CM | POA: Diagnosis not present

## 2022-10-10 DIAGNOSIS — H6123 Impacted cerumen, bilateral: Secondary | ICD-10-CM | POA: Diagnosis not present

## 2022-10-10 DIAGNOSIS — R059 Cough, unspecified: Secondary | ICD-10-CM | POA: Diagnosis not present

## 2022-10-10 DIAGNOSIS — G35 Multiple sclerosis: Secondary | ICD-10-CM | POA: Diagnosis not present

## 2022-10-10 DIAGNOSIS — R41841 Cognitive communication deficit: Secondary | ICD-10-CM | POA: Diagnosis not present

## 2022-10-10 DIAGNOSIS — M6281 Muscle weakness (generalized): Secondary | ICD-10-CM | POA: Diagnosis not present

## 2022-10-10 DIAGNOSIS — R262 Difficulty in walking, not elsewhere classified: Secondary | ICD-10-CM | POA: Diagnosis not present

## 2022-10-10 DIAGNOSIS — R279 Unspecified lack of coordination: Secondary | ICD-10-CM | POA: Diagnosis not present

## 2022-10-13 DIAGNOSIS — Z8673 Personal history of transient ischemic attack (TIA), and cerebral infarction without residual deficits: Secondary | ICD-10-CM | POA: Diagnosis not present

## 2022-10-13 DIAGNOSIS — G35 Multiple sclerosis: Secondary | ICD-10-CM | POA: Diagnosis not present

## 2022-10-13 DIAGNOSIS — M6281 Muscle weakness (generalized): Secondary | ICD-10-CM | POA: Diagnosis not present

## 2022-10-13 DIAGNOSIS — R262 Difficulty in walking, not elsewhere classified: Secondary | ICD-10-CM | POA: Diagnosis not present

## 2022-10-13 DIAGNOSIS — R41841 Cognitive communication deficit: Secondary | ICD-10-CM | POA: Diagnosis not present

## 2022-10-13 DIAGNOSIS — Z9181 History of falling: Secondary | ICD-10-CM | POA: Diagnosis not present

## 2022-10-13 DIAGNOSIS — R279 Unspecified lack of coordination: Secondary | ICD-10-CM | POA: Diagnosis not present

## 2022-10-13 DIAGNOSIS — R2681 Unsteadiness on feet: Secondary | ICD-10-CM | POA: Diagnosis not present

## 2022-10-14 ENCOUNTER — Other Ambulatory Visit: Payer: Self-pay | Admitting: Neurology

## 2022-10-14 DIAGNOSIS — R262 Difficulty in walking, not elsewhere classified: Secondary | ICD-10-CM | POA: Diagnosis not present

## 2022-10-14 DIAGNOSIS — R2681 Unsteadiness on feet: Secondary | ICD-10-CM | POA: Diagnosis not present

## 2022-10-14 DIAGNOSIS — M6281 Muscle weakness (generalized): Secondary | ICD-10-CM | POA: Diagnosis not present

## 2022-10-14 DIAGNOSIS — R279 Unspecified lack of coordination: Secondary | ICD-10-CM | POA: Diagnosis not present

## 2022-10-14 DIAGNOSIS — R41841 Cognitive communication deficit: Secondary | ICD-10-CM | POA: Diagnosis not present

## 2022-10-14 DIAGNOSIS — G35 Multiple sclerosis: Secondary | ICD-10-CM | POA: Diagnosis not present

## 2022-10-15 DIAGNOSIS — H25811 Combined forms of age-related cataract, right eye: Secondary | ICD-10-CM | POA: Diagnosis not present

## 2022-10-16 DIAGNOSIS — R41841 Cognitive communication deficit: Secondary | ICD-10-CM | POA: Diagnosis not present

## 2022-10-16 DIAGNOSIS — R262 Difficulty in walking, not elsewhere classified: Secondary | ICD-10-CM | POA: Diagnosis not present

## 2022-10-16 DIAGNOSIS — M6281 Muscle weakness (generalized): Secondary | ICD-10-CM | POA: Diagnosis not present

## 2022-10-16 DIAGNOSIS — R2681 Unsteadiness on feet: Secondary | ICD-10-CM | POA: Diagnosis not present

## 2022-10-16 DIAGNOSIS — R279 Unspecified lack of coordination: Secondary | ICD-10-CM | POA: Diagnosis not present

## 2022-10-16 DIAGNOSIS — G35 Multiple sclerosis: Secondary | ICD-10-CM | POA: Diagnosis not present

## 2022-10-17 ENCOUNTER — Ambulatory Visit (INDEPENDENT_AMBULATORY_CARE_PROVIDER_SITE_OTHER): Payer: Medicare Other | Admitting: Clinical

## 2022-10-17 ENCOUNTER — Telehealth: Payer: Self-pay | Admitting: Physical Medicine and Rehabilitation

## 2022-10-17 ENCOUNTER — Encounter: Payer: Self-pay | Admitting: Hematology and Oncology

## 2022-10-17 DIAGNOSIS — G35 Multiple sclerosis: Secondary | ICD-10-CM | POA: Diagnosis not present

## 2022-10-17 DIAGNOSIS — F331 Major depressive disorder, recurrent, moderate: Secondary | ICD-10-CM

## 2022-10-17 DIAGNOSIS — F419 Anxiety disorder, unspecified: Secondary | ICD-10-CM

## 2022-10-17 DIAGNOSIS — M6281 Muscle weakness (generalized): Secondary | ICD-10-CM | POA: Diagnosis not present

## 2022-10-17 DIAGNOSIS — R2681 Unsteadiness on feet: Secondary | ICD-10-CM | POA: Diagnosis not present

## 2022-10-17 DIAGNOSIS — R41841 Cognitive communication deficit: Secondary | ICD-10-CM | POA: Diagnosis not present

## 2022-10-17 DIAGNOSIS — R262 Difficulty in walking, not elsewhere classified: Secondary | ICD-10-CM | POA: Diagnosis not present

## 2022-10-17 DIAGNOSIS — R279 Unspecified lack of coordination: Secondary | ICD-10-CM | POA: Diagnosis not present

## 2022-10-17 NOTE — Telephone Encounter (Signed)
Patient was getting a prescription from her psychiatrist for Aptos, but they are retiring and needs another prescription for it.  Husband wants to know if she really needs it since she is on other medication.  Please advise.

## 2022-10-17 NOTE — Progress Notes (Signed)
[place] Counselor Initial Adult Exam  Name: Carly [name] Date: 10/17/2022 MRN: [id number] DOB: 1951/08/03 PCP: Einar Pheasant, MD  Time spent: 9:32am - 10:35am   Guardian/Payee:  NA    Paperwork requested:  NA  Reason for Visit Carly [name] Problem: Patient stated, "mentally I don't feel all there some times", "I have a lot of mental problems". Patient reported she was diagnosed with Multiple Sclerosis 15 yeas ago and symptoms have has been progressive.   Mental Status Exam: Appearance:   Neat     Behavior:  Appropriate  Motor:  Patient reported limited mobility  Speech/Language:   Clear and Coherent  Affect:  Tearful  Mood:  anxious  Thought process:  normal  Thought content:    WNL  Sensory/Perceptual disturbances:    WNL  Orientation:  oriented to person, place, and situation  Attention:  Good  Concentration:  Good  Memory:  Patient reported decline in short term memory and at times deferred to caregivers in response to questions  Fund of knowledge:   Good  Insight:    Fair  Judgment:   Good  Impulse Control:  Good   Reported Symptoms:  Patient stated, "I'm just not the same person I use to be" and stated "I just feel different in a lot of ways", "the way I think, the way I feel". Patient's caregiver reported patient experiences difficulty verbally expressing her thoughts, becomes frustrated/agitated, decline in short term memory, confusion at times, and mood fluctuates from happy to angry. Patient reported feelings of anxiety, frequent sadness, nausea when anxious, fatigue, loss of interest, decreased energy, increased sleep, history of hopelessness, and increased appetite. Patient reported feelings of anger started when she and her husband moved to their current location. Patient reported sentimental items were lost in the move and patient reported this upsets her. Patient reported additional symptoms started when anger began. It was reported patient  gets upset when using the lift.   Risk Assessment: Danger to Self:  No Patient denied current suicidal ideation. Patient reported history of suicidal ideation, but denied intent or plan.  Self-injurious Behavior: No Danger to Others: No Patient denied current and past homicidal ideation Duty to Warn:no Physical Aggression / Violence:No  Access to Firearms a concern: No  Gang Involvement:No  Patient / guardian was educated about steps to take if suicide or homicide risk level increases between visits: yes While future psychiatric events cannot be accurately predicted, the patient does not currently require acute inpatient psychiatric care and does not currently meet [facility] involuntary commitment criteria.  Substance Abuse History: Current substance abuse: No  current or past use.   Past Psychiatric History:   Previous psychological history is significant for depression Outpatient Providers: history of individual therapy with a provider, Temisha Murley [place], and another provider in [place] History of Psych Hospitalization: No  Psychological Testing:  none reported    Abuse History:  Victim of: No.,  none    Report needed: No. Victim of Neglect:No. Perpetrator of  none reported    Witness / Exposure to Domestic Violence:  none reported   Protective Services Involvement: No  Witness to [employer] Violence:   none reported   Family History:  Family History  Problem Relation Age of Onset   Arthritis Mother    Hypertension Mother    Macular degeneration Mother    Hypertension Father    Hyperlipidemia Father    Heart disease Maternal Grandfather    Diabetes Maternal Grandfather    Kidney  disease Paternal Grandmother     Living situation: the patient lives with their spouse and has caregivers that provide in home care during the day  Sexual Orientation: Straight  Relationship Status: married for almost 33 years Name of spouse / other: [name] If a parent, number of children /  ages: 3 sons (ages 31, 52, 21)  Support Systems: spouse Friends, in home caregivers  Financial Stress:  No   Income/Employment/Disability: Actor: No   Educational History: Education: some college  Religion/Sprituality/World View: "Believe in God"  Any cultural differences that may affect / interfere with treatment:  not applicable   Recreation/Hobbies: watching television  Stressors: Health problems   Other: using lift for mobility    Strengths: Patient stated, "I'm not dealing with it very well right now".  Barriers:  Patient reported she doesn't like using a lift to transfer and it was reported patient has limited use of her legs  Legal History: Pending legal issue / charges: The patient has no significant history of legal issues. History of legal issue / charges:  none  Medical History/Surgical History: reviewed Past Medical History:  Diagnosis Date   Allergy    Anxiety    Aspiration pneumonia ([place])    Depression    Frequent headaches    H/O   GERD (gastroesophageal reflux disease)    History of chicken pox    History of colon polyps    Hx of migraines    Hypertension    Lymphedema    Multiple sclerosis ([place]) 2011   OSA (obstructive sleep apnea)    PONV (postoperative nausea and vomiting)    Scoliosis     Past Surgical History:  Procedure Laterality Date   BACK SURGERY     CHOLECYSTECTOMY     COLONOSCOPY N/A 04/23/2022   Procedure: COLONOSCOPY;  Surgeon: Toledo, Benay Pike, MD;  Location: [facility] ENDOSCOPY;  Service: Gastroenterology;  Laterality: N/A;   COLONOSCOPY WITH PROPOFOL N/A 05/18/2017   Procedure: COLONOSCOPY WITH PROPOFOL;  Surgeon: [name], MD;  Location: [facility] ENDOSCOPY;  Service: Endoscopy;  Laterality: N/A;   FOOT SURGERY  2015   GALLBLADDER SURGERY  2008   HARDWARE REMOVAL Left 02/14/2016   Procedure: LEFT FOOT REMOVAL DEEP IMPLANT;  Surgeon: [name], MD;  Location: [place];   Service: Orthopedics;  Laterality: Left;   HERNIA REPAIR     Inguinal Hernia Repair   SPINE SURGERY  2014    Medications: Current Outpatient Medications  Medication Sig Dispense Refill   busPIRone (BUSPAR) 15 MG tablet TAKE 1 TABLET BY MOUTH 3 TIMES DAILY 90 tablet 1   acetaminophen (TYLENOL) 500 MG tablet Take 1 tablet (500 mg total) by mouth every 4 (four) hours as needed. 30 tablet 0   albuterol (VENTOLIN HFA) 108 (90 Base) MCG/ACT inhaler Inhale 2 puffs into the lungs every 6 (six) hours as needed. 18 g 0   amLODipine (NORVASC) 5 MG tablet TAKE 1 TABLET BY MOUTH DAILY. 90 tablet 1   azelastine (ASTELIN) 0.1 % nasal spray SMARTSIG:1-2 Spray(s) Both Nares Twice Daily     baclofen (LIORESAL) 10 MG tablet Takes '10mg'$  q 4 hours 180 each 5   benzonatate (TESSALON) 200 MG capsule Take 1 capsule (200 mg total) by mouth 2 (two) times daily as needed for cough. 20 capsule 0   Cholecalciferol (D3-1000 PO) Take 2,000 Units by mouth daily.     clopidogrel (PLAVIX) 75 MG tablet TAKE 1 TABLET (75 MG) BY  MOUTH EVERY DAY 90 tablet 1   cyanocobalamin (,VITAMIN B-12,) 1000 MCG/ML injection Inject 1 mL (1,000 mcg total) into the muscle every 30 (thirty) days. 1 mL 3   cyclobenzaprine (FLEXERIL) 5 MG tablet Take 1 tablet (5 mg total) by mouth in the morning and at bedtime. 60 tablet 5   dalfampridine 10 MG TB12 Take 1 tablet (10 mg total) by mouth every 12 (twelve) hours. 180 tablet 3   docusate sodium (COLACE) 100 MG capsule Take 100 mg by mouth 2 (two) times daily.     doxycycline (VIBRAMYCIN) 100 MG capsule Take 1 capsule (100 mg total) by mouth 2 (two) times daily. 14 capsule 0   fluticasone (FLONASE) 50 MCG/ACT nasal spray Place into the nose.     furosemide (LASIX) 20 MG tablet Take 20 mg by mouth daily.     gabapentin (NEURONTIN) 100 MG capsule Take '200mg'$  in the morning, '200mg'$  in the afternoon and '200mg'$  at bedtime. 180 capsule 5   LORazepam (ATIVAN) 0.5 MG tablet Take 1 tablet (0.5 mg total) by mouth  every 8 (eight) hours. 90 tablet 3   losartan (COZAAR) 50 MG tablet TAKE 1 TABLET BY MOUTH DAILY 90 tablet 1   magnesium oxide (MAG-OX) 400 MG tablet TAKE 1 TABLET BY MOUTH DAILY 30 tablet 5   METAMUCIL FIBER PO Take by mouth 2 (two) times daily.     modafinil (PROVIGIL) 200 MG tablet Take 1 tablet in the morning and another tablet 4-5 hours later 60 tablet 3   nystatin cream (MYCOSTATIN) Apply 1 application topically 2 (two) times daily. 30 g 11   ondansetron (ZOFRAN) 4 MG tablet Take 1 tablet (4 mg total) by mouth every 8 (eight) hours as needed for nausea or vomiting. 20 tablet 0   pantoprazole (PROTONIX) 40 MG tablet TWICE DAILY AT 7AM AND 5PM 62 tablet 1   predniSONE (DELTASONE) 10 MG tablet Take 1 tablet (10 mg total) by mouth daily with breakfast. 7 tablet 0   rosuvastatin (CRESTOR) 5 MG tablet TAKE 1 TABLET BY MOUTH EVERY MONDAY, WEDNESDAY AND FRIDAY. 90 tablet 1   saccharomyces boulardii (FLORASTOR) 250 MG capsule Take 1 capsule (250 mg total) by mouth 2 (two) times daily. 60 capsule 0   sertraline (ZOLOFT) 100 MG tablet Take 150 mg by mouth daily.     sodium chloride (OCEAN) 0.65 % nasal spray Place 1-2 sprays into the nose as needed.     topiramate (TOPAMAX) 25 MG tablet Take 1 tablet (25 mg total) by mouth at bedtime. 90 tablet 3   No current facility-administered medications for this visit.   Facility-Administered Medications Ordered in Other Visits  Medication Dose Route Frequency Provider Last Rate Last Admin   0.9 %  sodium chloride infusion   Intravenous Continuous Lequita Asal, MD 10 mL/hr at 04/23/22 1127 Continued from Pre-op at 04/23/22 1127   acetaminophen (TYLENOL) tablet 650 mg  650 mg Oral Once Lequita Asal, MD      10/17/22 patient reported she is no longer taking lasix, provigil, prednisone  Allergies  Allergen Reactions   Ibuprofen Swelling and Other (See Comments)   Sulfamethoxazole-Trimethoprim Itching   Penicillin G Rash   Asa [Aspirin] Swelling    Buspirone Other (See Comments)    Headaches     Levofloxacin Other (See Comments)    "weakness"   Lexapro [Escitalopram Oxalate] Other (See Comments)    Weakness    Pollen Extract Hives   Ceftin [Cefuroxime Axetil] Rash  Penicillins Rash   Sulfa Antibiotics Rash    Diagnoses:  Major depressive disorder, recurrent episode, moderate (HCC)  Anxiety disorder, unspecified type  Plan of Care: Clinician conducted initial assessment via WebEx video from clinician's home office. Patient provided verbal consent to proceed with telehealth session and participated in session from patient's home. Patient provided verbal consent for caregiver, Margarita Grizzle, and her husband, [name], to be present during session. Patient is a 72 year old female who presented for an initial assessment. Patient stated, "mentally I don't feel all there some times" and "I have a lot of mental problems" when clinician inquired about reason for today's visit. Patient reported she was diagnosed with Multiple Sclerosis 15 years ago and it was reported symptoms have has been progressive since onset. Patient's caregiver reported patient experiences difficulty verbally expressing her thoughts, becomes frustrated/agitated, decline in short term memory, confusion at times, and mood fluctuates from happy to angry. Patient reported feelings of anxiety, frequent sadness, nausea when anxious, fatigue, loss of interest, decreased energy, increased sleep, history of hopelessness, and increased appetite. Patient reported feelings of anger started when she and her husband moved to their current home. Patient reported sentimental items were lost in the move and patient reported this upsets her. Patient reported additional symptoms started with onset of anger. It was reported patient becomes upset when using the lift. Patient denied current suicidal ideation. Patient reported a history of suicidal ideation, but denied previous intent or plan. Patient  denied current and past homicidal ideation. Patient reported no current or past substance use. Patient reported a history of depression and reported previous participation in individual therapy for treatment of depression. Patient reported no history of psychiatric hospitalizations. Patient reported she requires a lift for transferring and reported she doesn't like to use the lift. Patient identified her husband, friends, and caregivers as current supports. It is recommended patient be referred to a psychiatrist for a medication management consult and recommended patient participate in individual therapy. Clinician will review recommendations and treatment plan with patient during follow up appointment.   Katherina Right, LCSW

## 2022-10-17 NOTE — Progress Notes (Signed)
                Dora Simeone, LCSW 

## 2022-10-20 ENCOUNTER — Telehealth: Payer: Self-pay | Admitting: Nurse Practitioner

## 2022-10-20 DIAGNOSIS — R262 Difficulty in walking, not elsewhere classified: Secondary | ICD-10-CM | POA: Diagnosis not present

## 2022-10-20 DIAGNOSIS — G35 Multiple sclerosis: Secondary | ICD-10-CM | POA: Diagnosis not present

## 2022-10-20 DIAGNOSIS — R41841 Cognitive communication deficit: Secondary | ICD-10-CM | POA: Diagnosis not present

## 2022-10-20 DIAGNOSIS — M6281 Muscle weakness (generalized): Secondary | ICD-10-CM | POA: Diagnosis not present

## 2022-10-20 DIAGNOSIS — R2681 Unsteadiness on feet: Secondary | ICD-10-CM | POA: Diagnosis not present

## 2022-10-20 DIAGNOSIS — R279 Unspecified lack of coordination: Secondary | ICD-10-CM | POA: Diagnosis not present

## 2022-10-20 NOTE — Telephone Encounter (Signed)
I called Carly [name] to schedule f/u pc visit as last seen in 06/2022; Carly Fifita endorses doing well and would like to be d/c, no longer needed services. Will d/c per request

## 2022-10-21 DIAGNOSIS — R279 Unspecified lack of coordination: Secondary | ICD-10-CM | POA: Diagnosis not present

## 2022-10-21 DIAGNOSIS — M6281 Muscle weakness (generalized): Secondary | ICD-10-CM | POA: Diagnosis not present

## 2022-10-21 DIAGNOSIS — R262 Difficulty in walking, not elsewhere classified: Secondary | ICD-10-CM | POA: Diagnosis not present

## 2022-10-21 DIAGNOSIS — G35 Multiple sclerosis: Secondary | ICD-10-CM | POA: Diagnosis not present

## 2022-10-21 DIAGNOSIS — R41841 Cognitive communication deficit: Secondary | ICD-10-CM | POA: Diagnosis not present

## 2022-10-21 DIAGNOSIS — R2681 Unsteadiness on feet: Secondary | ICD-10-CM | POA: Diagnosis not present

## 2022-10-22 ENCOUNTER — Telehealth: Payer: Self-pay | Admitting: Internal Medicine

## 2022-10-22 ENCOUNTER — Ambulatory Visit: Payer: Medicare Other | Admitting: Neurology

## 2022-10-22 NOTE — Telephone Encounter (Signed)
Pt husband called in staying that he thinks her wife has an UTI, so he would like a culture order in as per the nurse at [facility] she has all the symptoms. I mentioned that she has an appt 01/12 to see Dr. Nicki Reaper.

## 2022-10-23 ENCOUNTER — Telehealth: Payer: Self-pay | Admitting: Neurology

## 2022-10-23 DIAGNOSIS — R41841 Cognitive communication deficit: Secondary | ICD-10-CM | POA: Diagnosis not present

## 2022-10-23 DIAGNOSIS — R279 Unspecified lack of coordination: Secondary | ICD-10-CM | POA: Diagnosis not present

## 2022-10-23 DIAGNOSIS — R2681 Unsteadiness on feet: Secondary | ICD-10-CM | POA: Diagnosis not present

## 2022-10-23 DIAGNOSIS — M6281 Muscle weakness (generalized): Secondary | ICD-10-CM | POA: Diagnosis not present

## 2022-10-23 DIAGNOSIS — G35 Multiple sclerosis: Secondary | ICD-10-CM | POA: Diagnosis not present

## 2022-10-23 DIAGNOSIS — R262 Difficulty in walking, not elsewhere classified: Secondary | ICD-10-CM | POA: Diagnosis not present

## 2022-10-23 NOTE — Telephone Encounter (Signed)
Pt husband returning call

## 2022-10-23 NOTE — Telephone Encounter (Signed)
Called the patient's husband back. He states that the patient has been taking a medication Deplin 15 mg (he was unsure what it is for) but she has ran out and [name] used to order this. Upon reviewing this appears to be a depression medication. I advised that this would need to further be discussed with PCP or Psych. They are in transition with being setting up with Psych.  He questioned about the topamax medication and whether she should be taking that medication. Advised that this appears to be prescribed by Dr Ranell Patrick and per the previous notes 12/4 it is confusing on whether she should be taking it.  6) She developed pain in right side, and not sure if this is due to topamax or the antibiotics she is on to treat a respiratory infection. " discontinue topamax in case pain in right side is due to medication- discussed can also be due to antibiotics which she is taking for respiratory infection- discontinuing the topamax will help [place] know if it is the cause of her pain or not."  But its ordered and sent to the pharmacy on same day and at the bottom of the same note "6 minutes spent in discussion of her depression, progressing MS, Wahls protocol, advising diets rich in protein, fruits, and vegetables, trying Topamax "  I advised he would need to call that MD to clarify that.   Husband then asked about the cyclobenzaprine and whether she should be taking this scheduled. [facility] fills/packages her medication and he doesn't understand why they put that in a bottle separately Advised it is ordered BID daily and in notes that is how she is taking it so it would seem this should be part of daily routine meds. He will follow up with [facility] on that. He was appreciative for the call

## 2022-10-23 NOTE — Telephone Encounter (Signed)
Pt's husband called requesting to discuss some ot the pt's medication with RN. Please advise.

## 2022-10-23 NOTE — Telephone Encounter (Signed)
LM for pt husband, pt , and pt caregiver [name] to call office back re: appt tomorrow, and UTI

## 2022-10-23 NOTE — Telephone Encounter (Signed)
S/w pt husband - advised of appointment and will check urine tomorrow at visit

## 2022-10-24 ENCOUNTER — Ambulatory Visit (INDEPENDENT_AMBULATORY_CARE_PROVIDER_SITE_OTHER): Payer: Medicare Other | Admitting: Internal Medicine

## 2022-10-24 ENCOUNTER — Other Ambulatory Visit: Payer: Self-pay | Admitting: Internal Medicine

## 2022-10-24 ENCOUNTER — Encounter: Payer: Self-pay | Admitting: Internal Medicine

## 2022-10-24 ENCOUNTER — Telehealth: Payer: Self-pay

## 2022-10-24 VITALS — BP 136/72 | HR 100 | Temp 97.8°F | Resp 16 | Ht 63.0 in | Wt 189.8 lb

## 2022-10-24 DIAGNOSIS — E7800 Pure hypercholesterolemia, unspecified: Secondary | ICD-10-CM

## 2022-10-24 DIAGNOSIS — G4733 Obstructive sleep apnea (adult) (pediatric): Secondary | ICD-10-CM

## 2022-10-24 DIAGNOSIS — K635 Polyp of colon: Secondary | ICD-10-CM

## 2022-10-24 DIAGNOSIS — Z01818 Encounter for other preprocedural examination: Secondary | ICD-10-CM

## 2022-10-24 DIAGNOSIS — R413 Other amnesia: Secondary | ICD-10-CM | POA: Diagnosis not present

## 2022-10-24 DIAGNOSIS — Z8673 Personal history of transient ischemic attack (TIA), and cerebral infarction without residual deficits: Secondary | ICD-10-CM

## 2022-10-24 DIAGNOSIS — G35 Multiple sclerosis: Secondary | ICD-10-CM | POA: Diagnosis not present

## 2022-10-24 DIAGNOSIS — R059 Cough, unspecified: Secondary | ICD-10-CM | POA: Diagnosis not present

## 2022-10-24 DIAGNOSIS — I10 Essential (primary) hypertension: Secondary | ICD-10-CM

## 2022-10-24 DIAGNOSIS — G35D Multiple sclerosis, unspecified: Secondary | ICD-10-CM

## 2022-10-24 DIAGNOSIS — R739 Hyperglycemia, unspecified: Secondary | ICD-10-CM

## 2022-10-24 DIAGNOSIS — F339 Major depressive disorder, recurrent, unspecified: Secondary | ICD-10-CM

## 2022-10-24 DIAGNOSIS — R7989 Other specified abnormal findings of blood chemistry: Secondary | ICD-10-CM

## 2022-10-24 NOTE — Telephone Encounter (Signed)
Pt w/ appointment today for clearence

## 2022-10-24 NOTE — Telephone Encounter (Signed)
We received a faxed request for Medical Clearance from [facility]  I hand-delivered printout to Denita Lung, CMA, and saved copy to Dr. Alisa Graff electronic file.

## 2022-10-24 NOTE — Progress Notes (Unsigned)
Subjective:    Patient ID: Carly [name], female    DOB: 04/27/1951, 72 y.o.   MRN: [id number]  Patient here for No chief complaint on file.   HPI Here for pre op evaluation.    Past Medical History:  Diagnosis Date   Allergy    Anxiety    Aspiration pneumonia (HCC)    Depression    Frequent headaches    H/O   GERD (gastroesophageal reflux disease)    History of chicken pox    History of colon polyps    Hx of migraines    Hypertension    Lymphedema    Multiple sclerosis ([place]) 2011   OSA (obstructive sleep apnea)    PONV (postoperative nausea and vomiting)    Scoliosis    Past Surgical History:  Procedure Laterality Date   BACK SURGERY     CHOLECYSTECTOMY     COLONOSCOPY N/A 04/23/2022   Procedure: COLONOSCOPY;  Surgeon: Toledo, Benay Pike, MD;  Location: [facility] ENDOSCOPY;  Service: Gastroenterology;  Laterality: N/A;   COLONOSCOPY WITH PROPOFOL N/A 05/18/2017   Procedure: COLONOSCOPY WITH PROPOFOL;  Surgeon: [name], MD;  Location: [facility] ENDOSCOPY;  Service: Endoscopy;  Laterality: N/A;   FOOT SURGERY  2015   GALLBLADDER SURGERY  2008   HARDWARE REMOVAL Left 02/14/2016   Procedure: LEFT FOOT REMOVAL DEEP IMPLANT;  Surgeon: [name], MD;  Location: [place];  Service: Orthopedics;  Laterality: Left;   HERNIA REPAIR     Inguinal Hernia Repair   SPINE SURGERY  2014   Family History  Problem Relation Age of Onset   Arthritis Mother    Hypertension Mother    Macular degeneration Mother    Hypertension Father    Hyperlipidemia Father    Heart disease Maternal Grandfather    Diabetes Maternal Grandfather    Kidney disease Paternal Grandmother    Social History   Socioeconomic History   Marital status: Married    Spouse name: Engineer, technical sales   Number of children: 3   Years of education: 14   Highest education level: Associate degree: academic program  Occupational History   Not on file  Tobacco Use   Smoking status: Never   Smokeless  tobacco: Never  Vaping Use   Vaping Use: Never used  Substance and Sexual Activity   Alcohol use: No    Alcohol/week: 0.0 standard drinks of alcohol   Drug use: No   Sexual activity: Not Currently  Other Topics Concern   Not on file  Social History Narrative   Lives with husband    Right handed   Caffeine: 1 cup of coffee in the AM, coke occa. Trying to come off caffeine    Social Determinants of Health   Financial Resource Strain: Low Risk  (01/07/2022)   Overall Financial Resource Strain (CARDIA)    Difficulty of Paying Living Expenses: Not hard at all  Food Insecurity: No Food Insecurity (01/07/2022)   Hunger Vital Sign    Worried About Running Out of Food in the Last Year: Never true    Ran Out of Food in the Last Year: Never true  Transportation Needs: No Transportation Needs (01/07/2022)   PRAPARE - Hydrologist (Medical): No    Lack of Transportation (Non-Medical): No  Physical Activity: Insufficiently Active (01/07/2022)   Exercise Vital Sign    Days of Exercise per Week: 2 days    Minutes of Exercise per Session: 60 min  Stress: No Stress Concern Present (01/07/2022)   [place]    Feeling of Stress : Not at all  Social Connections: Unknown (01/07/2022)   Social Connection and Isolation Panel [NHANES]    Frequency of Communication with Friends and Family: Not on file    Frequency of Social Gatherings with Friends and Family: Not on file    Attends Religious Services: Not on file    Active Member of Clubs or Organizations: Not on file    Attends Archivist Meetings: Not on file    Marital Status: Married     Review of Systems     Objective:     There were no vitals taken for this visit. Wt Readings from Last 3 Encounters:  09/30/22 172 lb (78 kg)  06/03/22 200 lb (90.7 kg)  05/14/22 190 lb (86.2 kg)    Physical Exam   Outpatient Encounter Medications  as of 10/24/2022  Medication Sig   busPIRone (BUSPAR) 15 MG tablet TAKE 1 TABLET BY MOUTH 3 TIMES DAILY   acetaminophen (TYLENOL) 500 MG tablet Take 1 tablet (500 mg total) by mouth every 4 (four) hours as needed.   albuterol (VENTOLIN HFA) 108 (90 Base) MCG/ACT inhaler Inhale 2 puffs into the lungs every 6 (six) hours as needed.   amLODipine (NORVASC) 5 MG tablet TAKE 1 TABLET BY MOUTH DAILY.   azelastine (ASTELIN) 0.1 % nasal spray SMARTSIG:1-2 Spray(s) Both Nares Twice Daily   baclofen (LIORESAL) 10 MG tablet Takes '10mg'$  q 4 hours   benzonatate (TESSALON) 200 MG capsule Take 1 capsule (200 mg total) by mouth 2 (two) times daily as needed for cough.   Cholecalciferol (D3-1000 PO) Take 2,000 Units by mouth daily.   clopidogrel (PLAVIX) 75 MG tablet TAKE 1 TABLET (75 MG) BY MOUTH EVERY DAY   cyanocobalamin (,VITAMIN B-12,) 1000 MCG/ML injection Inject 1 mL (1,000 mcg total) into the muscle every 30 (thirty) days.   cyclobenzaprine (FLEXERIL) 5 MG tablet Take 1 tablet (5 mg total) by mouth in the morning and at bedtime.   dalfampridine 10 MG TB12 Take 1 tablet (10 mg total) by mouth every 12 (twelve) hours.   docusate sodium (COLACE) 100 MG capsule Take 100 mg by mouth 2 (two) times daily.   doxycycline (VIBRAMYCIN) 100 MG capsule Take 1 capsule (100 mg total) by mouth 2 (two) times daily.   fluticasone (FLONASE) 50 MCG/ACT nasal spray Place into the nose.   furosemide (LASIX) 20 MG tablet Take 20 mg by mouth daily.   gabapentin (NEURONTIN) 100 MG capsule Take '200mg'$  in the morning, '200mg'$  in the afternoon and '200mg'$  at bedtime.   LORazepam (ATIVAN) 0.5 MG tablet Take 1 tablet (0.5 mg total) by mouth every 8 (eight) hours.   losartan (COZAAR) 50 MG tablet TAKE 1 TABLET BY MOUTH DAILY   magnesium oxide (MAG-OX) 400 MG tablet TAKE 1 TABLET BY MOUTH DAILY   METAMUCIL FIBER PO Take by mouth 2 (two) times daily.   modafinil (PROVIGIL) 200 MG tablet Take 1 tablet in the morning and another tablet 4-5  hours later   nystatin cream (MYCOSTATIN) Apply 1 application topically 2 (two) times daily.   ondansetron (ZOFRAN) 4 MG tablet Take 1 tablet (4 mg total) by mouth every 8 (eight) hours as needed for nausea or vomiting.   pantoprazole (PROTONIX) 40 MG tablet TWICE DAILY AT 7AM AND 5PM   predniSONE (DELTASONE) 10 MG tablet Take 1 tablet (10 mg total)  by mouth daily with breakfast.   rosuvastatin (CRESTOR) 5 MG tablet TAKE 1 TABLET BY MOUTH EVERY MONDAY, WEDNESDAY AND FRIDAY.   saccharomyces boulardii (FLORASTOR) 250 MG capsule Take 1 capsule (250 mg total) by mouth 2 (two) times daily.   sertraline (ZOLOFT) 100 MG tablet Take 150 mg by mouth daily.   sodium chloride (OCEAN) 0.65 % nasal spray Place 1-2 sprays into the nose as needed.   topiramate (TOPAMAX) 25 MG tablet Take 1 tablet (25 mg total) by mouth at bedtime.   Facility-Administered Encounter Medications as of 10/24/2022  Medication   0.9 %  sodium chloride infusion   acetaminophen (TYLENOL) tablet 650 mg     Lab Results  Component Value Date   WBC 8.2 09/13/2022   HGB 13.3 09/13/2022   HCT 42.1 09/13/2022   PLT 277 09/13/2022   GLUCOSE 132 (H) 09/13/2022   CHOL 154 03/07/2022   TRIG 160 (H) 03/07/2022   HDL 69 03/07/2022   LDLDIRECT 127.0 08/17/2020   LDLCALC 58 03/07/2022   ALT 17 09/13/2022   AST 18 09/13/2022   NA 142 09/13/2022   K 4.0 09/13/2022   CL 108 09/13/2022   CREATININE 0.52 09/13/2022   BUN 9 09/13/2022   CO2 26 09/13/2022   TSH 2.050 03/07/2022   HGBA1C 5.8 (H) 03/07/2022    CT Abdomen Pelvis W Contrast  Result Date: 09/13/2022 CLINICAL DATA:  72 year old female with acute LEFT abdominal and pelvic pain. EXAM: CT ABDOMEN AND PELVIS WITH CONTRAST TECHNIQUE: Multidetector CT imaging of the abdomen and pelvis was performed using the standard protocol following bolus administration of intravenous contrast. RADIATION DOSE REDUCTION: This exam was performed according to the departmental dose-optimization  program which includes automated exposure control, adjustment of the mA and/or kV according to patient size and/or use of iterative reconstruction technique. CONTRAST:  189m OMNIPAQUE IOHEXOL 300 MG/ML  SOLN COMPARISON:  03/01/2018 CT and other studies FINDINGS: Lower chest: No acute abnormality. Hepatobiliary: Possible mild hepatic steatosis noted without other hepatic abnormality. The patient is status post cholecystectomy. There is no evidence of intrahepatic or extrahepatic biliary dilatation. Pancreas: Unremarkable Spleen: Unremarkable Adrenals/Urinary Tract: The kidneys, adrenal glands and bladder are unremarkable. Stomach/Bowel: Stomach is within normal limits. Appendix appears normal. No evidence of bowel wall thickening, distention, or inflammatory changes. Colonic diverticulosis identified without evidence of acute diverticulitis. Vascular/Lymphatic: Aortic atherosclerosis. No enlarged abdominal or pelvic lymph nodes. Reproductive: No significant change or abnormality. Other: No ascites, focal collection or pneumoperitoneum. Anterior abdominal wall repair again noted. Musculoskeletal: No acute or suspicious bony abnormalities are noted. Posterior/interbody fusion changes from L2-S1 again noted. IMPRESSION: 1. No evidence of acute abnormality. 2. Colonic diverticulosis without evidence of acute diverticulitis. 3. Possible mild hepatic steatosis. 4.  Aortic Atherosclerosis (ICD10-I70.0). Electronically Signed   By: JMargarette CanadaM.D.   On: 09/13/2022 18:35   [place]Venous Img Lower Unilateral Left  Result Date: 09/13/2022 CLINICAL DATA:  Lower extremity pain and swelling EXAM: LEFT LOWER EXTREMITY VENOUS DOPPLER ULTRASOUND TECHNIQUE: Gray-scale sonography with compression, as well as color and duplex ultrasound, were performed to evaluate the deep venous system(s) from the level of the common femoral vein through the popliteal and proximal calf veins. COMPARISON:  Lower extremity venous duplex, 03/01/2022.  FINDINGS: VENOUS Normal compressibility of the common femoral, superficial femoral, and popliteal veins, as well as the visualized calf veins. Visualized portions of profunda femoral vein and great saphenous vein unremarkable. No filling defects to suggest DVT on grayscale or color Doppler imaging.  Doppler waveforms show normal direction of venous flow, normal respiratory plasticity and response to augmentation. Limited views of the contralateral common femoral vein are unremarkable. OTHER No evidence of superficial thrombophlebitis or abnormal fluid collection. Limitations: Patient body habitus IMPRESSION: Suboptimal evaluation, within these constraints; No evidence of femoropopliteal DVT or superficial thrombophlebitis within the LEFT lower extremity. Electronically Signed   By: Michaelle Birks M.D.   On: 09/13/2022 16:57       Assessment & Plan:  There are no diagnoses linked to this encounter.   Einar Pheasant, MD

## 2022-10-25 ENCOUNTER — Encounter: Payer: Self-pay | Admitting: Internal Medicine

## 2022-10-25 NOTE — Assessment & Plan Note (Signed)
Continue crestor.  Low cholesterol diet and exercise. Follow lipid panel and liver function tests.   

## 2022-10-25 NOTE — Assessment & Plan Note (Signed)
Was seeing [name].  Per pt and husband, she is retiring.  Planning to talk with someone Wednesday - regarding medication management.  Question of psychiatrist.  Discussed the need to get established with a psychiatrist.  Agreeable, but wants to see what the appt next week involves.  Call with update.  Will need refill on deplin if remaining on medication.

## 2022-10-25 NOTE — Assessment & Plan Note (Signed)
Feel is multifactorial.  Discussed medications.  Sees neurology.  Increased depression and stress.  Answering questions appropriately today.  Follow.

## 2022-10-25 NOTE — Assessment & Plan Note (Signed)
Needs to continue cpap.

## 2022-10-25 NOTE — Assessment & Plan Note (Signed)
Was having persistent cough.  Treated with doxycycline and prednisone.  No sob.  Breathing stable.  No increased cough.  Some congestion.  Using astelin.  Taking claritin D and flonase.  Follow.

## 2022-10-25 NOTE — Assessment & Plan Note (Signed)
Continue risk factor modification.  Has been tolerating statin. Continue plavix.

## 2022-10-25 NOTE — Assessment & Plan Note (Signed)
Diet and exercise.  Follow liver function tests.   

## 2022-10-25 NOTE — Assessment & Plan Note (Signed)
Low-carb diet and exercise.  Follow metabolic panel and [facility].

## 2022-10-25 NOTE — Assessment & Plan Note (Signed)
Colonoscopy 04/23/22:COLON POLYP, ASCENDING; COLD BIOPSY:  - POLYPOID FRAGMENT OF BENIGN COLONIC MUCOSA WITH SUPERFICIAL REACTIVE  CHANGES.  - NEGATIVE FOR DYSPLASIA AND MALIGNANCY.

## 2022-10-25 NOTE — Assessment & Plan Note (Signed)
Followed by Dr Felecia Shelling.

## 2022-10-25 NOTE — Assessment & Plan Note (Addendum)
Here for pre op evaluation.  She reports decreased energy.  Still some congestion.  Questions regarding medication.  Mutually decided to hold on surgery.  Of note, EKG - SR with no acute ischemic changes. Pt notified at appt.

## 2022-10-25 NOTE — Assessment & Plan Note (Signed)
Continue amlodipine.  Follow pressures.

## 2022-10-26 NOTE — Progress Notes (Addendum)
Psychiatric Initial Adult Assessment   Patient Identification: Carly [name] MRN:  [id number] Date of Evaluation:  10/28/2022 Referral Source: Einar Pheasant, MD  Chief Complaint:   Chief Complaint  Patient presents with   Establish Care   Visit Diagnosis:    ICD-10-CM   1. Major depressive disorder, recurrent episode, moderate (HCC)  F33.1 TSH    2. Cognitive decline  R41.89 Vitamin B12    Folate      History of Present Illness:   Carly [name] is a 72 y.o. year old female with a history of depression, anxiety, multiple sclerosis, CVA, hypertension, OSA (on CPAP), obesity, low back pain, who is referred for depression.  Per chart review, she was seen by Dr. Weber Cooks in 2022 for anxiety and possibly dysthymia.  She presents to the visit with her husband.Carly [name].  She often asked him to answer questions, who helped to elaborate the history.  She states that she has no energy to do anything, and feels depressed.  She has never been at this point.  She was diagnosed with depression in her 68s.  Although surgery has helped her in the past, is not helping at all.  She feels bothered due to MS, and stroke.  Although she used to be able to go to the beach with her friends, she has not been able to do anything due to her physical condition. She also does not feel like she is herself anymore. She also states that although she still can do a lot of things, she is not interested or have no energy.  She has been diagnosed with MS in 2011, and has been in a wheelchair since 2020. She also becomes tearful while talking about loss of her parents several years ago.  She and her husband have moved from retirement community to a cottage last October.  It is handicapped accessible, and she has been doing much better there as they have more room.  She tends to stay in the house most of the time.  She does not want to go outside due to it is too cold.  She also needs to get a Lucianne Lei to go outside.  She  reports good help from her caregiver, who visits her a few times per week.   Carly [name], her husband presents to the visit.  She was doing well until 2011, when she retired from work to take care of her parents.  She was found to have MS around the time when she had evaluation for her back pain/scoliosis.  Their activity is very limited especially given they need to arrange a transportation.  She talks with her friends on the phone.  She used to enjoy going to [employer] and mountains with them.  She appears to have difficulty in putting thoughts into words.  She is forgetful.  She has hard time keeping up with the date.   Depression-she has depressive symptoms as in PHQ-9.  She has insomnia.  She has slight increase in appetite.  She denies SI.   Physical-she has right arm weakness due to stroke.  She has pain in her legs, which she attributes to MS.   Medication- Sertraline 150 mg, Gabapentin 200 mg, twice a day, baclofen, Buspirone 15 mg twice day  Functional Status Instrumental Activities of Daily Living (IADLs):  Carly [name] is independent in the following:   Requires assistance with the following: medications (husband), managing finances, driving  Activities of Daily Living (ADLs):  Carly [name] is independent  in the following: bathing and hygiene, feeding, continence, grooming and toileting, walking   Support: Household: husband Marital status: married Number of children: 3 ([place], [place], [place]) Employment: associate degree, elementary education Education: unemployed, used to work until 2008, 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  Her father was a Customer service manager.  Her mother stayed home.  She describes her mother as passive.  She has a brother Carly [name], who lives in [place]. She reports good relationship with him.     Mri 03/2022 FINDINGS: Brain: No restricted diffusion to  suggest acute or subacute infarct. No acute hemorrhage, mass, mass effect, midline shift. No abnormal parenchymal or meningeal enhancement.   Lacunar infarcts in the corona radiata (including the infarct that was new on 12/09/2020), basal ganglia, thalami, and pons. Punctate foci of hemosiderin deposition in the bilateral cerebral and cerebellar hemispheres, as well as the deep white matter, likely sequela of hypertensive microhemorrhages. Confluent T2 hyperintense signal in the periventricular white matter and pons, likely the sequela of severe chronic small vessel ischemic disease. Degree of cerebral volume loss is advanced for age, with ex vacuo dilatation of the lateral ventricles.  (140 lbs three years ago) Wt Readings from Last 3 Encounters:  10/28/22 190 lb (86.2 kg)  10/24/22 189 lb 12.8 oz (86.1 kg)  09/30/22 172 lb (78 kg)     Associated Signs/Symptoms: Depression Symptoms:  depressed mood, anhedonia, insomnia, fatigue, (Hypo) Manic Symptoms:   denies decreased need for sleep, euphoria Anxiety Symptoms:  Excessive Worry, Psychotic Symptoms:   denies AH, VH, paranoia PTSD Symptoms: Negative  Past Psychiatric History:  Outpatient:  Psychiatry admission: denies Previous suicide attempt: denies Past trials of medication: sertraline, citalopram, mirtazapine (weight gain), quetiapine, risperidone, Abilify. modafinil History of violence:  History of head injury:   Previous Psychotropic Medications: Yes   Substance Abuse History in the last 12 months:  No.  Consequences of Substance Abuse: NA  Past Medical History:  Past Medical History:  Diagnosis Date   Allergy    Anxiety    Aspiration pneumonia (HCC)    Depression    Frequent headaches    H/O   GERD (gastroesophageal reflux disease)    History of chicken pox    History of colon polyps    Hx of migraines    Hypertension    Lymphedema    Multiple sclerosis ([place]) 2011   OSA (obstructive sleep apnea)    PONV  (postoperative nausea and vomiting)    Scoliosis     Past Surgical History:  Procedure Laterality Date   BACK SURGERY     CHOLECYSTECTOMY     COLONOSCOPY N/A 04/23/2022   Procedure: COLONOSCOPY;  Surgeon: Toledo, Benay Pike, MD;  Location: [facility] ENDOSCOPY;  Service: Gastroenterology;  Laterality: N/A;   COLONOSCOPY WITH PROPOFOL N/A 05/18/2017   Procedure: COLONOSCOPY WITH PROPOFOL;  Surgeon: [name], MD;  Location: [facility] ENDOSCOPY;  Service: Endoscopy;  Laterality: N/A;   FOOT SURGERY  2015   GALLBLADDER SURGERY  2008   HARDWARE REMOVAL Left 02/14/2016   Procedure: LEFT FOOT REMOVAL DEEP IMPLANT;  Surgeon: [name], MD;  Location: [facility];  Service: Orthopedics;  Laterality: Left;   HERNIA REPAIR     Inguinal Hernia Repair   SPINE SURGERY  2014    Family Psychiatric History: as below  Family History:  Family History  Problem Relation Age of Onset   Arthritis Mother  Hypertension Mother    Macular degeneration Mother    Hypertension Father    Hyperlipidemia Father    Drug abuse Maternal Aunt    Heart disease Maternal Grandfather    Diabetes Maternal Grandfather    Kidney disease Paternal Grandmother     Social History:   Social History   Socioeconomic History   Marital status: Married    Spouse name: Engineer, technical sales   Number of children: 3   Years of education: 14   Highest education level: Associate degree: academic program  Occupational History   Not on file  Tobacco Use   Smoking status: Never   Smokeless tobacco: Never  Vaping Use   Vaping Use: Never used  Substance and Sexual Activity   Alcohol use: No    Alcohol/week: 0.0 standard drinks of alcohol   Drug use: No   Sexual activity: Not Currently  Other Topics Concern   Not on file  Social History Narrative   Lives with husband    Right handed   Caffeine: 1 cup of coffee in the AM, coke occa. Trying to come off caffeine    Social Determinants of Health   Financial Resource Strain:  Low Risk  (01/07/2022)   Overall Financial Resource Strain (CARDIA)    Difficulty of Paying Living Expenses: Not hard at all  Food Insecurity: No Food Insecurity (01/07/2022)   Hunger Vital Sign    Worried About Running Out of Food in the Last Year: Never true    Ran Out of Food in the Last Year: Never true  Transportation Needs: No Transportation Needs (01/07/2022)   PRAPARE - Hydrologist (Medical): No    Lack of Transportation (Non-Medical): No  Physical Activity: Insufficiently Active (01/07/2022)   Exercise Vital Sign    Days of Exercise per Week: 2 days    Minutes of Exercise per Session: 60 min  Stress: No Stress Concern Present (01/07/2022)   [place]    Feeling of Stress : Not at all  Social Connections: Unknown (01/07/2022)   Social Connection and Isolation Panel [NHANES]    Frequency of Communication with Friends and Family: Not on file    Frequency of Social Gatherings with Friends and Family: Not on file    Attends Religious Services: Not on file    Active Member of Clubs or Organizations: Not on file    Attends Archivist Meetings: Not on file    Marital Status: Married    Additional Social History: as above  Allergies:   Allergies  Allergen Reactions   Ibuprofen Swelling and Other (See Comments)   Sulfamethoxazole-Trimethoprim Itching   Penicillin G Rash   Asa [Aspirin] Swelling   Buspirone Other (See Comments)    Headaches     Levofloxacin Other (See Comments)    "weakness"   Lexapro [Escitalopram Oxalate] Other (See Comments)    Weakness    Pollen Extract Hives   Ceftin [Cefuroxime Axetil] Rash   Penicillins Rash   Sulfa Antibiotics Rash    Metabolic Disorder Labs: Lab Results  Component Value Date   HGBA1C 5.8 (H) 03/07/2022   MPG 116.89 12/09/2020   No results found for: "PROLACTIN" Lab Results  Component Value Date   CHOL 154 03/07/2022    TRIG 160 (H) 03/07/2022   HDL 69 03/07/2022   CHOLHDL 2.2 03/07/2022   VLDL 25.2 08/15/2021   LDLCALC 58 03/07/2022   LDLCALC 70 08/15/2021  Lab Results  Component Value Date   TSH 2.050 03/07/2022    Therapeutic Level Labs: No results found for: "LITHIUM" No results found for: "CBMZ" No results found for: "VALPROATE"  Current Medications: Current Outpatient Medications  Medication Sig Dispense Refill   amLODipine (NORVASC) 5 MG tablet TAKE 1 TABLET BY MOUTH DAILY. 90 tablet 1   baclofen (LIORESAL) 10 MG tablet Takes '10mg'$  q 4 hours 180 each 5   buPROPion (WELLBUTRIN XL) 150 MG 24 hr tablet Take 1 tablet (150 mg total) by mouth daily. 30 tablet 1   busPIRone (BUSPAR) 15 MG tablet TAKE 1 TABLET BY MOUTH 3 TIMES DAILY 90 tablet 1   Cholecalciferol (D3-1000 PO) Take 2,000 Units by mouth daily.     clopidogrel (PLAVIX) 75 MG tablet TAKE 1 TABLET (75 MG) BY MOUTH EVERY DAY 90 tablet 1   Coenzyme Q10 (COQ10 PO) Take by mouth.     cyanocobalamin (,VITAMIN B-12,) 1000 MCG/ML injection Inject 1 mL (1,000 mcg total) into the muscle every 30 (thirty) days. 1 mL 3   dalfampridine 10 MG TB12 Take 1 tablet (10 mg total) by mouth every 12 (twelve) hours. 180 tablet 3   docusate sodium (COLACE) 100 MG capsule Take 100 mg by mouth 2 (two) times daily.     fluticasone (FLONASE) 50 MCG/ACT nasal spray Place into the nose.     furosemide (LASIX) 20 MG tablet Take 20 mg by mouth daily.     gabapentin (NEURONTIN) 100 MG capsule Take '200mg'$  in the morning, '200mg'$  in the afternoon and '200mg'$  at bedtime. 180 capsule 5   L-Methylfolate 15 MG TABS Take 1 tablet (15 mg total) by mouth daily. 30 tablet 1   L-Methylfolate-Algae (DEPLIN 15 PO) Take by mouth.     loratadine (CLARITIN) 10 MG tablet Take 10 mg by mouth daily.     LORazepam (ATIVAN) 0.5 MG tablet Take 1 tablet (0.5 mg total) by mouth every 8 (eight) hours. 90 tablet 3   losartan (COZAAR) 50 MG tablet TAKE 1 TABLET BY MOUTH DAILY 90 tablet 1    MAGNESIUM GLYCINATE PO Take by mouth.     METAMUCIL FIBER PO Take by mouth 2 (two) times daily.     nystatin cream (MYCOSTATIN) Apply 1 application topically 2 (two) times daily. 30 g 11   ondansetron (ZOFRAN) 4 MG tablet Take 1 tablet (4 mg total) by mouth every 8 (eight) hours as needed for nausea or vomiting. 20 tablet 0   pantoprazole (PROTONIX) 40 MG tablet TWICE DAILY AT 7AM AND 5PM 62 tablet 1   rosuvastatin (CRESTOR) 5 MG tablet TAKE 1 TABLET BY MOUTH EVERY MONDAY, WEDNESDAY AND FRIDAY. 90 tablet 1   saccharomyces boulardii (FLORASTOR) 250 MG capsule Take 1 capsule (250 mg total) by mouth 2 (two) times daily. 60 capsule 0   sertraline (ZOLOFT) 100 MG tablet Take 150 mg by mouth daily.     sodium chloride (OCEAN) 0.65 % nasal spray Place 1-2 sprays into the nose as needed.     topiramate (TOPAMAX) 25 MG tablet Take 1 tablet (25 mg total) by mouth at bedtime. 90 tablet 3   No current facility-administered medications for this visit.   Facility-Administered Medications Ordered in Other Visits  Medication Dose Route Frequency Provider Last Rate Last Admin   0.9 %  sodium chloride infusion   Intravenous Continuous Lequita Asal, MD 10 mL/hr at 04/23/22 1127 Continued from Pre-op at 04/23/22 1127   acetaminophen (TYLENOL) tablet 650 mg  650  mg Oral Once Lequita Asal, MD        Musculoskeletal: Strength & Muscle Tone: within normal limits Gait & Station: normal Patient leans: N/A  Psychiatric Specialty Exam: Review of Systems  Psychiatric/Behavioral:  Positive for decreased concentration, dysphoric mood and sleep disturbance. Negative for agitation, behavioral problems, confusion, hallucinations, self-injury and suicidal ideas. The patient is nervous/anxious. The patient is not hyperactive.   All other systems reviewed and are negative.   Blood pressure 139/82, pulse 90, temperature 98.1 F (36.7 C), temperature source Oral, height '5\' 3"'$  (1.6 m), weight 190 lb (86.2  kg).Body mass index is 33.66 kg/m.  General Appearance: Fairly Groomed  Eye Contact:  Good  Speech:  Clear and Coherent  Volume:  Normal  Mood:  Depressed  Affect:  Appropriate and Restricted  Thought Process:  Coherent  Orientation:  Full (Time, Place, and Person)  Thought Content:  Logical  Suicidal Thoughts:  No  Homicidal Thoughts:  No  Memory:  Immediate;   Good  Judgement:  Good  Insight:  Good  Psychomotor Activity:   slight increase in tonus in bilateral arm no rigidity, no resting/postural tremors, no tardive dyskinesia    Concentration:  Concentration: Good and Attention Span: Good  Recall:  Good  Fund of Knowledge:Good  Language: Good  Akathisia:  No  Handed:  Right  AIMS (if indicated):  not done  Assets:  Communication Skills Desire for Improvement  ADL's:  Intact  Cognition: WNL  Sleep:  Poor   Screenings: GAD-7    Flowsheet Row Office Visit from 10/28/2022 in [place]  Total GAD-7 Score [place] Office Visit from 08/13/2022 in [facility] Clinical Support from 03/10/2017 in [facility]  Total Score (max 30 points ) 21 30      PHQ2-9    [place] Visit from 10/28/2022 in [place] Office Visit from 05/22/2022 in [place] Video Visit from 04/09/2022 in [facility] [place] from 01/07/2022 in [place] Office Visit from 11/05/2021 in [facility]  PHQ-2 Total Score '6 6 2 '$ 0 1  PHQ-9 Total Score '18 22 3 '$ -- --      [place] Visit from 10/28/2022 in [place] ED from 09/13/2022 in [place] Admission (Discharged) from 04/23/2022 in [place] No Risk No Risk No Risk        Assessment and Plan:  Carly [name] is a 72 y.o. year old female with a history of depression, anxiety, multiple sclerosis, CVA, hypertension, OSA (on CPAP), obesity, low back pain, who is referred for depression.   1. Major depressive disorder, recurrent episode, moderate ([place]) Exam is notable for restricted affect, and she reports significant worsening in depressive symptoms with fatigue over the past several months.  Psychosocial stressors includes demoralization due to MS/CVA, and she reports loss of her parents several years ago.  She reports good nurturing relationship with her parents growing up.  She has good support from her husband at home.  Will start bupropion to target depression.  She has known history of seizure.  Discussed potential risk of headache, insomnia.  Will continue sertraline, metrifonate to target depression at this time.  Will continue BuSpar for her anxiety.  Will plan to taper off some of her  medication to avoid polypharmacy in the future.  Will obtain labs to rule out medical health issues contributing to her symptoms.   2. Cognitive decline She reports worsening in attention, and has word-finding difficulty.  She is at risk of cognitive decline due to MS and stroke.  Will obtain labs to rule out medical health issues contributing to this.   Plan Start bupropion 150 mg daily  Continue sertraline 150 mg daily  Continue L methylfolate 15 mg daily  Continue Buspirone 15 mg twice day Obtain labs (TSH, vitamin B12, folate) Next appointment: 2/27 at 1:30, IP  -on Gabapentin 200 mg, twice a day, baclofen - on Lorazepam 0.5 mg three times a day as needed (takes it twice a day)   The patient demonstrates the following risk factors for suicide: Chronic risk factors for suicide include: psychiatric disorder of depression, medical illness of MS, and chronic pain. Acute risk factors for suicide include: unemployment and loss (financial, interpersonal, professional).  Protective factors for this patient include: positive social support and hope for the future. Considering these factors, the overall suicide risk at this point appears to be low. Patient is appropriate for outpatient follow up.   Collaboration of Care: Other reviewed notes in Epic  Patient/Guardian was advised Release of Information must be obtained prior to any record release in order to collaborate their care with an outside provider. Patient/Guardian was advised if they have not already done so to contact the registration department to sign all necessary forms in order for [place] to release information regarding their care.   Consent: Patient/Guardian gives verbal consent for treatment and assignment of benefits for services provided during this visit. Patient/Guardian expressed understanding and agreed to proceed.    The duration of the time spent on the following activities on the date of the encounter was 45 minutes.   Preparing to see the patient (e.g., review of test, records)  Obtaining and/or reviewing separately obtained history  Performing a medically necessary exam and/or evaluation  Counseling and educating the patient/family/caregiver  Ordering medications, tests, or procedures   Documenting clinical information in the electronic or paper health record  Independently interpreting results of tests/labs and communication of results to the family or caregiver   Norman Clay, MD 1/16/20245:16 PM

## 2022-10-27 ENCOUNTER — Other Ambulatory Visit: Payer: Self-pay | Admitting: Neurology

## 2022-10-27 ENCOUNTER — Ambulatory Visit: Payer: BC Managed Care – PPO | Admitting: Psychology

## 2022-10-27 ENCOUNTER — Other Ambulatory Visit: Payer: Self-pay

## 2022-10-27 ENCOUNTER — Telehealth: Payer: Self-pay

## 2022-10-27 MED ORDER — PANTOPRAZOLE SODIUM 40 MG PO TBEC: DELAYED_RELEASE_TABLET | ORAL | 1 refills | Status: DC

## 2022-10-27 NOTE — Addendum Note (Signed)
Addended by: Neta Ehlers on: 10/27/2022 09:25 AM   Modules accepted: Orders

## 2022-10-27 NOTE — Telephone Encounter (Signed)
Per Dr Nicki Reaper - PT NOT CLEARED FOR PROCEDURE - msg left on Dr Trena Platt scheduler vm x 2 : 1st - 1/12 2nd - 1/15  to advise surgery cancelled.

## 2022-10-28 ENCOUNTER — Other Ambulatory Visit
Admission: RE | Admit: 2022-10-28 | Discharge: 2022-10-28 | Disposition: A | Discharge status: Home / Self Care | Payer: Medicare Other | Source: Ambulatory Visit | Attending: Psychiatry | Admitting: Psychiatry

## 2022-10-28 ENCOUNTER — Ambulatory Visit (INDEPENDENT_AMBULATORY_CARE_PROVIDER_SITE_OTHER): Payer: Medicare Other | Admitting: Psychiatry

## 2022-10-28 ENCOUNTER — Encounter: Payer: Self-pay | Admitting: Psychiatry

## 2022-10-28 VITALS — BP 139/82 | HR 90 | Temp 98.1°F | Ht 63.0 in | Wt 190.0 lb

## 2022-10-28 DIAGNOSIS — F331 Major depressive disorder, recurrent, moderate: Secondary | ICD-10-CM

## 2022-10-28 DIAGNOSIS — R262 Difficulty in walking, not elsewhere classified: Secondary | ICD-10-CM | POA: Diagnosis not present

## 2022-10-28 DIAGNOSIS — R279 Unspecified lack of coordination: Secondary | ICD-10-CM | POA: Diagnosis not present

## 2022-10-28 DIAGNOSIS — M6281 Muscle weakness (generalized): Secondary | ICD-10-CM | POA: Diagnosis not present

## 2022-10-28 DIAGNOSIS — R2681 Unsteadiness on feet: Secondary | ICD-10-CM | POA: Diagnosis not present

## 2022-10-28 DIAGNOSIS — R41841 Cognitive communication deficit: Secondary | ICD-10-CM | POA: Diagnosis not present

## 2022-10-28 DIAGNOSIS — R4189 Other symptoms and signs involving cognitive functions and awareness: Secondary | ICD-10-CM | POA: Insufficient documentation

## 2022-10-28 DIAGNOSIS — G35 Multiple sclerosis: Secondary | ICD-10-CM | POA: Diagnosis not present

## 2022-10-28 LAB — TSH: TSH: 2.118 u[IU]/mL (ref 0.350–4.500)

## 2022-10-28 LAB — URINE CULTURE

## 2022-10-28 LAB — URINALYSIS, ROUTINE W REFLEX MICROSCOPIC
Bilirubin, UA: NEGATIVE
Glucose, UA: NEGATIVE
Ketones, UA: NEGATIVE
Leukocytes,UA: NEGATIVE
Nitrite, UA: NEGATIVE
Protein,UA: NEGATIVE
RBC, UA: NEGATIVE
Specific Gravity, UA: 1.012 (ref 1.005–1.030)
Urobilinogen, Ur: 0.2 mg/dL (ref 0.2–1.0)
pH, UA: 6 (ref 5.0–7.5)

## 2022-10-28 LAB — SPECIMEN STATUS REPORT

## 2022-10-28 LAB — FOLATE: Folate: >40 ng/mL (ref >5.9)

## 2022-10-28 LAB — VITAMIN B12: Vitamin B-12: 356 pg/mL (ref 180–914)

## 2022-10-28 MED ORDER — L-METHYLFOLATE 15 MG PO TABS: 15.0000 mg | ORAL_TABLET | Freq: Every day | ORAL | 1 refills | Status: AC

## 2022-10-28 MED ORDER — BUPROPION HCL ER (XL) 150 MG PO TB24: 150.0000 mg | ORAL_TABLET | Freq: Every day | ORAL | 1 refills | Status: AC

## 2022-10-28 NOTE — Patient Instructions (Signed)
Start bupropion 150 mg daily  Continue sertraline 150 mg daily  Continue L methylfolate 15 mg daily  Continue Buspirone 15 mg twice day Obtain labs (TSH, vitamin B12, folate) Next appointment: 2/27 at 1:30

## 2022-10-29 ENCOUNTER — Other Ambulatory Visit: Payer: Self-pay

## 2022-10-29 ENCOUNTER — Encounter: Payer: Self-pay | Admitting: Psychiatry

## 2022-10-29 ENCOUNTER — Encounter: Payer: Self-pay | Admitting: Nurse Practitioner

## 2022-10-29 ENCOUNTER — Other Ambulatory Visit: Payer: Medicare Other | Admitting: Nurse Practitioner

## 2022-10-29 DIAGNOSIS — G35 Multiple sclerosis: Secondary | ICD-10-CM | POA: Diagnosis not present

## 2022-10-29 DIAGNOSIS — R5381 Other malaise: Secondary | ICD-10-CM

## 2022-10-29 DIAGNOSIS — G8929 Other chronic pain: Secondary | ICD-10-CM | POA: Diagnosis not present

## 2022-10-29 DIAGNOSIS — Z515 Encounter for palliative care: Secondary | ICD-10-CM

## 2022-10-29 DIAGNOSIS — M542 Cervicalgia: Secondary | ICD-10-CM | POA: Diagnosis not present

## 2022-10-29 MED ORDER — NITROFURANTOIN MONOHYD MACRO 100 MG PO CAPS: 100.0000 mg | ORAL_CAPSULE | Freq: Two times a day (BID) | ORAL | 0 refills | Status: DC

## 2022-10-29 NOTE — Addendum Note (Signed)
Addended by: Norman Clay on: 10/29/2022 08:23 AM   Modules accepted: Level of Service

## 2022-10-29 NOTE — Progress Notes (Addendum)
[facility] Consult Note Telephone: [phone]  Fax: [phone]    Date of encounter: 10/29/22 7:51 PM PATIENT NAME: Carly [name]  [id number]   [phone] (home)  DOB: 1950-11-13 MRN: [id number] PRIMARY CARE PROVIDER:   Independent Living home at [facility] with her husband Carly Pheasant, MD,  [address] [place] [address] [phone] RESPONSIBLE PARTY:    Contact Information     Name Relation Home Work Mobile   Carly [name] Spouse [phone]  [phone]   Carly [name]   [phone]      I met face to face with patient in home. Palliative Care was asked to follow this patient by consultation request of  Carly Pheasant, MD [name] at [facility] with husband to address advance care planning and complex medical decision making. This is a follow up visit.                                  ASSESSMENT AND PLAN / RECOMMENDATIONS:  Symptom Management/Plan: 1. Advance Care Planning;  Full code 2. Goals of Care: Goals include to maximize quality of life and symptom management. Our advance care planning conversation included a discussion about:    The value and importance of advance care planning  Exploration of personal, cultural or spiritual beliefs that might influence medical decisions  Exploration of goals of care in the event of a sudden injury or illness  Identification and preparation of a healthcare agent  Review and updating or creation of an advance directive document. 3. Palliative care encounter; Palliative care encounter; Palliative medicine team will continue to support patient, patient's family, and medical team. Visit consisted of counseling and education dealing with the complex and emotionally intense issues of symptom management and palliative care in the setting of serious and potentially  life-threatening illness  4. Debility/generalized weakness secondary to Carly; decline, discussed at length about functional abilities, using a sit/stand lift. We talked about the difficulty of using the lift, recommendations given about using lambs wool to put over the part of the strap. We talked about safety, fall precautions.  5. Chronic neck pain discussed at length, pain scale, talked about long history of chronic pain, Carly, chronic disease progression. We talked about mobility, limitations, barriers. Talked about medications. We talked about option of Dr Hulan Saas, [place] as he does see complex cases of chronic pain for more holistic modalities. Carly and Carly [name] in agreement, contact information given.   Follow up Palliative Care Visit: Palliative care will continue to follow for complex medical decision making, advance care planning, and clarification of goals. Return 4 to 8 weeks or prn.  I spent 65 minutes providing this consultation. More than 50% of the time in this consultation was spent in counseling and care coordination. PPS: 40% Chief Complaint: Follow up palliative consult for complex medical decision making, address goals, manage ongoing symptoms  HISTORY OF PRESENT ILLNESS:  Carly [name] is a 72 y.o. year old female  with multiple medical problems including  Carly, depression, anxiety, hypertension, OSA, scoliosis, GERD, headaches, lymphedema.  I visited Carly and Carly [name], with caregiver in their home. Carly [name] was sitting in the chair with her feet up. We talked about past medical history, family and social history, having 3 adult son's. We talked about symptoms of Carly, diagnosis and progression of  disease. We talked about challenges with chronic back/neck pain. We talked about ros, appetite, weight. We talked about functional overall decline. We talked about medical goals, medications, goc reviewed. Discussed role pc in poc. We talked about f/u visit  following visit with Dr Tamala Julian for chronic pain. Carly [name] in agreement with plan of care. 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 We talked about loss of independence. We talked about daily challenges she experiences. We talked about grieving, coping strategies. Carly [name] endorses thankful for pc visit. Therapeutic listening, emotional support provided. Questions answered. Contact information provided.  History obtained from review of EMR, discussion with Carly [name] with caregiver. I reviewed available labs, medications, imaging, studies and related documents from the EMR.  Records reviewed and summarized above.  Physical Exam: Constitutional: NAD General: pleasant female ENMT: oral mucous membranes moist CV: S1S2, RRR Pulmonary: decreased bases otherwise clear Skin: warm and dry Neuro:  + generalized weakness,  + cognitive impairment Psych: non-anxious affect, A and O x 3, forgetful Thank you for the opportunity to participate in the care of Carly [name]. Please call our office at [phone] if we can be of additional assistance.   Ahsan Esterline Ihor Gully, NP

## 2022-10-31 ENCOUNTER — Telehealth: Payer: Self-pay

## 2022-10-31 ENCOUNTER — Other Ambulatory Visit: Payer: Self-pay | Admitting: Psychiatry

## 2022-10-31 ENCOUNTER — Telehealth: Payer: Self-pay | Admitting: Psychiatry

## 2022-10-31 DIAGNOSIS — R2681 Unsteadiness on feet: Secondary | ICD-10-CM | POA: Diagnosis not present

## 2022-10-31 DIAGNOSIS — R262 Difficulty in walking, not elsewhere classified: Secondary | ICD-10-CM | POA: Diagnosis not present

## 2022-10-31 DIAGNOSIS — M6281 Muscle weakness (generalized): Secondary | ICD-10-CM | POA: Diagnosis not present

## 2022-10-31 DIAGNOSIS — G35 Multiple sclerosis: Secondary | ICD-10-CM | POA: Diagnosis not present

## 2022-10-31 DIAGNOSIS — R279 Unspecified lack of coordination: Secondary | ICD-10-CM | POA: Diagnosis not present

## 2022-10-31 DIAGNOSIS — R41841 Cognitive communication deficit: Secondary | ICD-10-CM | POA: Diagnosis not present

## 2022-10-31 MED ORDER — L-METHYLFOLATE 15 MG PO TABS: 15.0000 mg | ORAL_TABLET | Freq: Every day | ORAL | 1 refills | Status: DC

## 2022-10-31 NOTE — Telephone Encounter (Signed)
What do you mean by they cannot find the deplin? The order is sent to the pharmacy- please verity with them.

## 2022-10-31 NOTE — Telephone Encounter (Signed)
pt came by office states that they need the NAME brand of Deplin sent to [facility].

## 2022-10-31 NOTE — Telephone Encounter (Signed)
Yes, I believe they can transfer the original order to that pharmacy especially given this medication is not controlled substance. Will not send new order at this time.

## 2022-10-31 NOTE — Telephone Encounter (Signed)
states he needs the deplin sent directly to [facility] to [facility] [phone] if needed to speak with her.

## 2022-10-31 NOTE — Telephone Encounter (Signed)
Patient husband, Carly [name], stopped by the office. [facility] does not carry Deplin. He found a pharmacy that does, [place]. He left the information at home but needs a prescription sent to this pharmacy. He is calling [facility] to see if they can transfer it.

## 2022-10-31 NOTE — Telephone Encounter (Signed)
pt husband left message that they can not find the deplin.

## 2022-10-31 NOTE — Telephone Encounter (Signed)
Including you both juse in case as I hear different information. Deplin name brand is sent to [facility]. Please contact the pharmacy to cancel generic Deplin order.

## 2022-11-03 DIAGNOSIS — G35 Multiple sclerosis: Secondary | ICD-10-CM | POA: Diagnosis not present

## 2022-11-03 DIAGNOSIS — R262 Difficulty in walking, not elsewhere classified: Secondary | ICD-10-CM | POA: Diagnosis not present

## 2022-11-03 DIAGNOSIS — R279 Unspecified lack of coordination: Secondary | ICD-10-CM | POA: Diagnosis not present

## 2022-11-03 DIAGNOSIS — R2681 Unsteadiness on feet: Secondary | ICD-10-CM | POA: Diagnosis not present

## 2022-11-03 DIAGNOSIS — M6281 Muscle weakness (generalized): Secondary | ICD-10-CM | POA: Diagnosis not present

## 2022-11-03 DIAGNOSIS — R41841 Cognitive communication deficit: Secondary | ICD-10-CM | POA: Diagnosis not present

## 2022-11-04 DIAGNOSIS — R2681 Unsteadiness on feet: Secondary | ICD-10-CM | POA: Diagnosis not present

## 2022-11-04 DIAGNOSIS — R41841 Cognitive communication deficit: Secondary | ICD-10-CM | POA: Diagnosis not present

## 2022-11-04 DIAGNOSIS — G35 Multiple sclerosis: Secondary | ICD-10-CM | POA: Diagnosis not present

## 2022-11-04 DIAGNOSIS — R279 Unspecified lack of coordination: Secondary | ICD-10-CM | POA: Diagnosis not present

## 2022-11-04 DIAGNOSIS — R262 Difficulty in walking, not elsewhere classified: Secondary | ICD-10-CM | POA: Diagnosis not present

## 2022-11-04 DIAGNOSIS — M6281 Muscle weakness (generalized): Secondary | ICD-10-CM | POA: Diagnosis not present

## 2022-11-05 DIAGNOSIS — M5412 Radiculopathy, cervical region: Secondary | ICD-10-CM | POA: Diagnosis not present

## 2022-11-05 DIAGNOSIS — R519 Headache, unspecified: Secondary | ICD-10-CM | POA: Diagnosis not present

## 2022-11-05 DIAGNOSIS — M79602 Pain in left arm: Secondary | ICD-10-CM | POA: Diagnosis not present

## 2022-11-05 DIAGNOSIS — M9901 Segmental and somatic dysfunction of cervical region: Secondary | ICD-10-CM | POA: Diagnosis not present

## 2022-11-05 DIAGNOSIS — M7918 Myalgia, other site: Secondary | ICD-10-CM | POA: Diagnosis not present

## 2022-11-05 DIAGNOSIS — M5413 Radiculopathy, cervicothoracic region: Secondary | ICD-10-CM | POA: Diagnosis not present

## 2022-11-05 DIAGNOSIS — M9900 Segmental and somatic dysfunction of head region: Secondary | ICD-10-CM | POA: Diagnosis not present

## 2022-11-05 DIAGNOSIS — M79601 Pain in right arm: Secondary | ICD-10-CM | POA: Diagnosis not present

## 2022-11-05 DIAGNOSIS — M542 Cervicalgia: Secondary | ICD-10-CM | POA: Diagnosis not present

## 2022-11-06 DIAGNOSIS — M6281 Muscle weakness (generalized): Secondary | ICD-10-CM | POA: Diagnosis not present

## 2022-11-06 DIAGNOSIS — R262 Difficulty in walking, not elsewhere classified: Secondary | ICD-10-CM | POA: Diagnosis not present

## 2022-11-06 DIAGNOSIS — R41841 Cognitive communication deficit: Secondary | ICD-10-CM | POA: Diagnosis not present

## 2022-11-06 DIAGNOSIS — G35 Multiple sclerosis: Secondary | ICD-10-CM | POA: Diagnosis not present

## 2022-11-06 DIAGNOSIS — R2681 Unsteadiness on feet: Secondary | ICD-10-CM | POA: Diagnosis not present

## 2022-11-06 DIAGNOSIS — R279 Unspecified lack of coordination: Secondary | ICD-10-CM | POA: Diagnosis not present

## 2022-11-07 ENCOUNTER — Ambulatory Visit (INDEPENDENT_AMBULATORY_CARE_PROVIDER_SITE_OTHER): Payer: Medicare Other | Admitting: Clinical

## 2022-11-07 ENCOUNTER — Other Ambulatory Visit: Payer: Self-pay | Admitting: *Deleted

## 2022-11-07 ENCOUNTER — Telehealth: Payer: Self-pay | Admitting: Internal Medicine

## 2022-11-07 DIAGNOSIS — R41841 Cognitive communication deficit: Secondary | ICD-10-CM | POA: Diagnosis not present

## 2022-11-07 DIAGNOSIS — M6281 Muscle weakness (generalized): Secondary | ICD-10-CM | POA: Diagnosis not present

## 2022-11-07 DIAGNOSIS — R2681 Unsteadiness on feet: Secondary | ICD-10-CM | POA: Diagnosis not present

## 2022-11-07 DIAGNOSIS — F331 Major depressive disorder, recurrent, moderate: Secondary | ICD-10-CM

## 2022-11-07 DIAGNOSIS — F419 Anxiety disorder, unspecified: Secondary | ICD-10-CM

## 2022-11-07 DIAGNOSIS — R059 Cough, unspecified: Secondary | ICD-10-CM

## 2022-11-07 DIAGNOSIS — G35 Multiple sclerosis: Secondary | ICD-10-CM | POA: Diagnosis not present

## 2022-11-07 DIAGNOSIS — R279 Unspecified lack of coordination: Secondary | ICD-10-CM | POA: Diagnosis not present

## 2022-11-07 DIAGNOSIS — R262 Difficulty in walking, not elsewhere classified: Secondary | ICD-10-CM | POA: Diagnosis not present

## 2022-11-07 NOTE — Telephone Encounter (Signed)
CXR ordered for Outpatient Imaging at [facility]

## 2022-11-07 NOTE — Telephone Encounter (Signed)
Msg sent to [facility] to confirm we can do in office due to pt not being able to stand on her own

## 2022-11-07 NOTE — Telephone Encounter (Signed)
Patient called and is requesting a chest xray. She states she can not stop coughing, it does not hurt cough, no chest congestion.

## 2022-11-07 NOTE — Telephone Encounter (Signed)
Per Carolee Rota - pt will have to go to outpatient imaging for cxr due to her not being able to stand on her own. Ok to place order?  Or wait for virtual appt w/ [facility] Tuesday

## 2022-11-07 NOTE — Progress Notes (Signed)
[place] Counselor/Therapist Progress Note  Patient ID: Carly [name], MRN: [id number]    Date: 11/07/22  Time Spent: 1:30  pm - 2:05 pm : 35 Minutes  Treatment Type: Individual Therapy.  Reported Symptoms: Patient reported recent improvement in symptoms.   Mental Status Exam: Appearance:  Could not assess      Behavior: Could not assess  Motor: Could not assess  Speech/Language:  Clear and Coherent  Affect: Could not assess  Mood: normal  Thought process: normal  Thought content:   WNL  Sensory/Perceptual disturbances:   WNL  Orientation: oriented to person, place, and situation  Attention: Good  Concentration: Good  Memory: Patient deferred to husband for answers to questions during session  Fund of knowledge:  Good  Insight:   Fair  Judgment:  Good  Impulse Control: Good   Risk Assessment: Danger to Self:  No Patient denied current suicidal ideation Self-injurious Behavior: No Danger to Others: No Patient denied current homicidal ideation Duty to Warn:no Physical Aggression / Violence:No  Access to Firearms a concern: No  Gang Involvement:No   Subjective:  Patient reported recent appointment with psychiatrist, Dr. Forbes Cellar, and reported psychiatrist prescribed bupropion. Patient stated, "I really think my nerves have gotten better", "I'm not getting upset like I use to and not crying like I use to". Patient stated, "that's exactly right" and stated, "you were right on spot" in response to diagnoses . Patient reported recent improvement in sleep but reported feeling drowsy during the day. Patient stated,  "I think everything is beginning to improve". Patient stated, "I like therapy", "I'm an advocate of that" in response to recommendation for therapy. Patient stated, "I do want to participate in therapy".   Interventions: Motivational Interviewing. Clinician conducted session via WebEx audio from clinician's home office due to patient's video  component of WebEx not working. Patient provided verbal consent to proceed with telehealth session and participated in session from patient's home. Patient provided verbal consent for her husband and caregiver to be present during session. Clinician reviewed diagnoses and treatment recommendations. Provided psycho education related to diagnoses and treatment. Patient inquired about psychotropic medication and clinician provided psycho education related to psychotropic medications. Clinician recommended patient follow up with psychiatrist to discuss concerns related to medication. Clinician requested patient identify potential goals for therapy to discuss during next session.   Diagnosis:  Major depressive disorder, recurrent episode, moderate (HCC)  Anxiety disorder, unspecified type   Plan: Goals to be developed during follow up appointment on 11/28/22.                      Katherina Right, LCSW

## 2022-11-07 NOTE — Telephone Encounter (Signed)
Ok to go to outpatient imaging

## 2022-11-07 NOTE — Telephone Encounter (Signed)
S/w pt husband - appointment booked for Tuesday 11am w/ Tullo for virtual eval.

## 2022-11-07 NOTE — Telephone Encounter (Signed)
I am ok with her coming in for cxr since unable to make appt.  I can then f/u with her after results

## 2022-11-07 NOTE — Telephone Encounter (Signed)
Husband notified that order has been placed & I gave him the address & phone number to check for weekend hours.

## 2022-11-10 ENCOUNTER — Other Ambulatory Visit: Payer: Self-pay | Admitting: Psychiatry

## 2022-11-10 ENCOUNTER — Other Ambulatory Visit: Payer: Self-pay | Admitting: Neurology

## 2022-11-10 ENCOUNTER — Ambulatory Visit: Payer: BC Managed Care – PPO | Admitting: Psychology

## 2022-11-10 MED ORDER — L-METHYLFOLATE 15 MG PO TABS: 15.0000 mg | ORAL_TABLET | Freq: Every day | ORAL | 0 refills | Status: DC

## 2022-11-10 NOTE — Telephone Encounter (Addendum)
Last seen 08/2022 Follow up scheduled on 12/09/22  Per drug register last filed 10/07/22 #90 tablet Rx pending to be signed

## 2022-11-11 ENCOUNTER — Telehealth (INDEPENDENT_AMBULATORY_CARE_PROVIDER_SITE_OTHER): Payer: Medicare Other | Admitting: Internal Medicine

## 2022-11-11 ENCOUNTER — Encounter: Payer: Self-pay | Admitting: Internal Medicine

## 2022-11-11 VITALS — Ht 63.0 in | Wt 190.0 lb

## 2022-11-11 DIAGNOSIS — G35 Multiple sclerosis: Secondary | ICD-10-CM | POA: Diagnosis not present

## 2022-11-11 DIAGNOSIS — R262 Difficulty in walking, not elsewhere classified: Secondary | ICD-10-CM | POA: Diagnosis not present

## 2022-11-11 DIAGNOSIS — R2681 Unsteadiness on feet: Secondary | ICD-10-CM | POA: Diagnosis not present

## 2022-11-11 DIAGNOSIS — R279 Unspecified lack of coordination: Secondary | ICD-10-CM | POA: Diagnosis not present

## 2022-11-11 DIAGNOSIS — M6281 Muscle weakness (generalized): Secondary | ICD-10-CM | POA: Diagnosis not present

## 2022-11-11 DIAGNOSIS — R41841 Cognitive communication deficit: Secondary | ICD-10-CM | POA: Diagnosis not present

## 2022-11-11 DIAGNOSIS — R059 Cough, unspecified: Secondary | ICD-10-CM

## 2022-11-11 NOTE — Assessment & Plan Note (Signed)
She denies fevrs,  pleurisy and dysphagia  consider reflux as cause if chest x ray is normal.  Change administration to bid pre meal  and continue cough suppression

## 2022-11-11 NOTE — Patient Instructions (Addendum)
Change the pantoprazole to 30 minutes before meals twice daily  Sleep with head of bed elevated   Use the tessalon perles every 8 hours to suppress the cough  Chest x ray tomorrow at 10 am  AT [place]

## 2022-11-11 NOTE — Progress Notes (Signed)
Virtual Visit via [place]   Note   This format is felt to be most appropriate for this patient at this time.  All issues noted in this document were discussed and addressed.  No physical exam was performed (except for noted visual exam findings with Video Visits).   I connected with Carly [name]  on 11/11/22 at 11:00 AM EST by a video enabled telemedicine application  and verified that I am speaking with the correct person using two identifiers. Location patient: home Location provider: work or home office Persons participating in the virtual visit: patient, provider  I discussed the limitations, risks, security and privacy concerns of performing an evaluation and management service by telephone and the availability of in person appointments. I also discussed with the patient that there may be a patient responsible charge related to this service. The patient expressed understanding and agreed to proceed.   Reason for visit: persistent cough   HPI:  72 yr old female with GERD,  MS ,  CVA with right sided hemiparesis  presents with persistent non productive cough that has not responded to empiric treatment for bronchitis and cough suppression. .cough is worse at night and wakes her up .  Improves with elevation of torso .  Takes pantoprazole twice daily but not on an empty stomach.  She denies pleurisy,  wheezing, fevers and shortness of breath .      ROS: See pertinent positives and negatives per HPI.  Past Medical History:  Diagnosis Date   Allergy    Anxiety    Aspiration pneumonia (HCC)    Depression    Frequent headaches    H/O   GERD (gastroesophageal reflux disease)    History of chicken pox    History of colon polyps    Hx of migraines    Hypertension    Lymphedema    Multiple sclerosis ([place]) 2011   OSA (obstructive sleep apnea)    PONV (postoperative nausea and vomiting)    Scoliosis     Past Surgical History:  Procedure Laterality Date   BACK SURGERY      CHOLECYSTECTOMY     COLONOSCOPY N/A 04/23/2022   Procedure: COLONOSCOPY;  Surgeon: Toledo, Benay Pike, MD;  Location: [facility] ENDOSCOPY;  Service: Gastroenterology;  Laterality: N/A;   COLONOSCOPY WITH PROPOFOL N/A 05/18/2017   Procedure: COLONOSCOPY WITH PROPOFOL;  Surgeon: [name], MD;  Location: [facility] ENDOSCOPY;  Service: Endoscopy;  Laterality: N/A;   FOOT SURGERY  2015   GALLBLADDER SURGERY  2008   HARDWARE REMOVAL Left 02/14/2016   Procedure: LEFT FOOT REMOVAL DEEP IMPLANT;  Surgeon: [name], MD;  Location: [place];  Service: Orthopedics;  Laterality: Left;   HERNIA REPAIR     Inguinal Hernia Repair   SPINE SURGERY  2014    Family History  Problem Relation Age of Onset   Arthritis Mother    Hypertension Mother    Macular degeneration Mother    Hypertension Father    Hyperlipidemia Father    Drug abuse Maternal Aunt    Heart disease Maternal Grandfather    Diabetes Maternal Grandfather    Kidney disease Paternal Grandmother     SOCIAL HX:  reports that she has never smoked. She has never used smokeless tobacco. She reports that she does not drink alcohol and does not use drugs.    Current Outpatient Medications:    amLODipine (NORVASC) 5 MG tablet, TAKE 1 TABLET BY MOUTH DAILY., Disp: 90 tablet, Rfl: 1  baclofen (LIORESAL) 10 MG tablet, Takes '10mg'$  q 4 hours, Disp: 180 each, Rfl: 5   buPROPion (WELLBUTRIN XL) 150 MG 24 hr tablet, Take 1 tablet (150 mg total) by mouth daily., Disp: 30 tablet, Rfl: 1   busPIRone (BUSPAR) 15 MG tablet, TAKE 1 TABLET BY MOUTH 3 TIMES DAILY, Disp: 90 tablet, Rfl: 1   Cholecalciferol (D3-1000 PO), Take 2,000 Units by mouth daily., Disp: , Rfl:    clopidogrel (PLAVIX) 75 MG tablet, TAKE 1 TABLET (75 MG) BY MOUTH EVERY DAY, Disp: 90 tablet, Rfl: 1   Coenzyme Q10 (COQ10 PO), Take by mouth., Disp: , Rfl:    cyanocobalamin (,VITAMIN B-12,) 1000 MCG/ML injection, Inject 1 mL (1,000 mcg total) into the muscle every 30 (thirty)  days., Disp: 1 mL, Rfl: 3   dalfampridine 10 MG TB12, Take 1 tablet (10 mg total) by mouth every 12 (twelve) hours., Disp: 180 tablet, Rfl: 3   docusate sodium (COLACE) 100 MG capsule, Take 100 mg by mouth 2 (two) times daily., Disp: , Rfl:    fluticasone (FLONASE) 50 MCG/ACT nasal spray, Place into the nose., Disp: , Rfl:    gabapentin (NEURONTIN) 100 MG capsule, Take '200mg'$  in the morning, '200mg'$  in the afternoon and '200mg'$  at bedtime., Disp: 180 capsule, Rfl: 5   L-Methylfolate 15 MG TABS, Take 1 tablet (15 mg total) by mouth daily., Disp: 30 tablet, Rfl: 1   L-Methylfolate 15 MG TABS, Take 1 tablet (15 mg total) by mouth daily., Disp: 90 tablet, Rfl: 0   L-Methylfolate-Algae (DEPLIN 15 PO), Take by mouth., Disp: , Rfl:    loratadine (CLARITIN) 10 MG tablet, Take 10 mg by mouth daily., Disp: , Rfl:    LORazepam (ATIVAN) 0.5 MG tablet, TAKE 1 TABLET BY MOUTH EVERY 8 HOURS AS DIRECTED, Disp: 90 tablet, Rfl: 3   losartan (COZAAR) 50 MG tablet, TAKE 1 TABLET BY MOUTH DAILY, Disp: 90 tablet, Rfl: 1   MAGNESIUM GLYCINATE PO, Take by mouth., Disp: , Rfl:    METAMUCIL FIBER PO, Take by mouth 2 (two) times daily., Disp: , Rfl:    nystatin cream (MYCOSTATIN), Apply 1 application topically 2 (two) times daily., Disp: 30 g, Rfl: 11   ondansetron (ZOFRAN) 4 MG tablet, Take 1 tablet (4 mg total) by mouth every 8 (eight) hours as needed for nausea or vomiting., Disp: 20 tablet, Rfl: 0   pantoprazole (PROTONIX) 40 MG tablet, TWICE DAILY AT 7AM AND 5PM, Disp: 62 tablet, Rfl: 1   rosuvastatin (CRESTOR) 5 MG tablet, TAKE 1 TABLET BY MOUTH EVERY MONDAY, WEDNESDAY AND FRIDAY., Disp: 90 tablet, Rfl: 1   saccharomyces boulardii (FLORASTOR) 250 MG capsule, Take 1 capsule (250 mg total) by mouth 2 (two) times daily., Disp: 60 capsule, Rfl: 0   sodium chloride (OCEAN) 0.65 % nasal spray, Place 1-2 sprays into the nose as needed., Disp: , Rfl:    furosemide (LASIX) 20 MG tablet, Take 20 mg by mouth daily. (Patient not  taking: Reported on 11/11/2022), Disp: , Rfl:    sertraline (ZOLOFT) 100 MG tablet, Take 150 mg by mouth daily. (Patient not taking: Reported on 11/11/2022), Disp: , Rfl:    topiramate (TOPAMAX) 25 MG tablet, Take 1 tablet (25 mg total) by mouth at bedtime. (Patient not taking: Reported on 11/11/2022), Disp: 90 tablet, Rfl: 3 No current facility-administered medications for this visit.  Facility-Administered Medications Ordered in Other Visits:    0.9 %  sodium chloride infusion, , Intravenous, Continuous, Corcoran, Melissa C, MD, Last Rate: 10  mL/hr at 04/23/22 1127, Continued from Pre-op at 04/23/22 1127   acetaminophen (TYLENOL) tablet 650 mg, 650 mg, Oral, Once, Corcoran, Drue Second, MD  EXAM:  VITALS per patient if applicable:  GENERAL: alert, oriented, appears well and in no acute distress  HEENT: atraumatic, conjunttiva clear, no obvious abnormalities on inspection of external nose and ears  NECK: normal movements of the head and neck  LUNGS: on inspection no signs of respiratory distress, breathing rate appears normal, no obvious gross SOB, gasping or wheezing  CV: no obvious cyanosis  MS: moves all visible extremities without noticeable abnormality  PSYCH/NEURO: pleasant and cooperative, no obvious depression or anxiety, speech and thought processing grossly intact  ASSESSMENT AND PLAN: Cough, unspecified type Assessment & Plan: She denies fevrs,  pleurisy and dysphagia  consider reflux as cause if chest x ray is normal.  Change administration to bid pre meal  and continue cough suppression        I discussed the assessment and treatment plan with the patient. The patient was provided an opportunity to ask questions and all were answered. The patient agreed with the plan and demonstrated an understanding of the instructions.   The patient was advised to call back or seek an in-person evaluation if the symptoms worsen or if the condition fails to improve as  anticipated.   I spent 20 minutes dedicated to the care of this patient on the date of this encounter to include pre-visit review of his medical history,  Face-to-face time with the patient , and post visit ordering of testing and therapeutics.    Crecencio Mc, MD

## 2022-11-12 ENCOUNTER — Ambulatory Visit
Admission: RE | Admit: 2022-11-12 | Discharge: 2022-11-12 | Disposition: A | Discharge status: Home / Self Care | Payer: Medicare Other | Source: Ambulatory Visit | Attending: Internal Medicine | Admitting: Internal Medicine

## 2022-11-12 ENCOUNTER — Ambulatory Visit
Admission: RE | Admit: 2022-11-12 | Discharge: 2022-11-12 | Disposition: A | Discharge status: Home / Self Care | Payer: Medicare Other | Attending: Internal Medicine | Admitting: Internal Medicine

## 2022-11-12 ENCOUNTER — Telehealth: Payer: Self-pay | Admitting: Internal Medicine

## 2022-11-12 ENCOUNTER — Other Ambulatory Visit: Payer: Self-pay

## 2022-11-12 DIAGNOSIS — R059 Cough, unspecified: Secondary | ICD-10-CM

## 2022-11-12 MED ORDER — BENZONATATE 100 MG PO CAPS: 100.0000 mg | ORAL_CAPSULE | Freq: Two times a day (BID) | ORAL | 0 refills | Status: DC | PRN

## 2022-11-12 NOTE — Telephone Encounter (Signed)
Pt husband called stating the pearls and the antibiotic was not at the pharmacy

## 2022-11-12 NOTE — Telephone Encounter (Signed)
Pt husband advised.

## 2022-11-13 ENCOUNTER — Encounter: Payer: Self-pay | Admitting: Physical Medicine and Rehabilitation

## 2022-11-13 ENCOUNTER — Encounter
Payer: Medicare Other | Attending: Physical Medicine and Rehabilitation | Admitting: Physical Medicine and Rehabilitation

## 2022-11-13 VITALS — BP 132/75 | HR 92 | Ht 63.0 in

## 2022-11-13 DIAGNOSIS — I69354 Hemiplegia and hemiparesis following cerebral infarction affecting left non-dominant side: Secondary | ICD-10-CM

## 2022-11-13 DIAGNOSIS — M7989 Other specified soft tissue disorders: Secondary | ICD-10-CM

## 2022-11-13 DIAGNOSIS — R252 Cramp and spasm: Secondary | ICD-10-CM

## 2022-11-13 MED ORDER — ONABOTULINUMTOXINA 100 UNITS IJ SOLR
100.0000 [IU] | Freq: Once | INTRAMUSCULAR | Status: AC
  Administered 2022-11-13: 100 [IU] via INTRAMUSCULAR

## 2022-11-13 MED ORDER — VITAMIN D (ERGOCALCIFEROL) 1.25 MG (50000 UNIT) PO CAPS: 50000.0000 [IU] | ORAL_CAPSULE | ORAL | 0 refills | Status: DC

## 2022-11-13 MED ORDER — FUROSEMIDE 20 MG PO TABS: 20.0000 mg | ORAL_TABLET | Freq: Every day | ORAL | 3 refills | Status: DC

## 2022-11-13 NOTE — Progress Notes (Signed)
Subjective:    Patient ID: Carly [name], female    DOB: 1950/10/22, 72 y.o.   MRN: [id number]  HPI     Carly [name] is a 72 year old woman who presents for f/u of confusion, white matter periventricular CVA, presents for follow-up regarding lymphedema, anxiety,chronic fatigue, insomnia, OSA, and weight gain, and new lower extremity weakness today.    1) Anxiety:  -improved since Dr. Kerman Passey started her on Lorazepam .'5mg'$  at night, she continues to get benefit from this -she asks what else she can do for this.  [name] is trying to help her to stay more active -discussed that she is doing better with the Zoloft at '150mg'$   -discussed that she was displeased that her behavioral health provider on Friday was female, she would prefer to talk with a female -she let the company know this and they do ot have any availability with female providers for 6 months -she says that psychiatry denied her referral and stated that it was because she had dementia. She notes that she has never ben diagnosed with dementia.  -her psychiatrist previously transitioned her from Seroquel back to benzodiazepine due to side effect profile of Seroquel, but this worsened her anxiety so we restarted it. She asks if she can increase Seroquel to '25mg'$  -she needs a new order for Seroquel as her current one only accounts for nighttime dose but she is also taking one prn during the day.  -she is currently a little out of it from the anesthesia for her recent colonoscopy -anxiety has been very severe -she started Zoloft this week and has not yet noted any benefit, she is a little discouraged as felt this should take effect sooner.  -she would like referral to Dr. Sonny Dandy who was recommended to her by [name], she has not yet heard from their office -she really likes following with [name] as well -she feels that her inability to see her grandkids is a trigger for her anxiety. She used to see them mmore in the  past and does not know why she doesn;t get to any mores -she does not want to restart her [name] as she does not want the weight gain associated with this medication -she has only been sleeping 4 hours per night and she feels this worsens her anxiety.  -she has been having a lot of anxiety related to her and her husband's move from their current home into a retirement home and she asks whether she may take the seroquel additionally as needed- once in the morning, once later in the day, and two at night -she is interested in following with a behavioral therapist -she and husband [name] were scared of side effects of Seroquel that were discussed- she has not noted any this far.  -she has been sleeping but her legs feel very stiff at night -she continues to take Cymbalta '60mg'$  -we tried weaning her off the mirtazepine but she experienced weakness and insomnia and wanted to restart- titrated back up to her former dose.  -she discussed with her husband, who is also present on this call today, and she would prefer to restart Seroquel and stop Lorazepam.  -she has not had any counseling and would like to restart this. She has an appointment with [name] in August and would like to start counseling earlier than this if possible.  -she loved speaking with [name] inpatient and would like to follow with him -she had symptoms of dizziness yesterday at  the hairdresser and was asked to contact me regarding if this could be from the seroquel. She had had no other similar episodes since starting it.  -she has been sleeping well with the Seroquel at night.  -she would like a list of foods that can help anxiety.  -she asks about the dietary advice I gave her last visit regarding anxiety. -she has been using essential oil.  -anxiety has been bad -off prednisone now -exercise helps her.  -she gets upset over things easily -she wants to be able to calm down.  -she needs refills of her cymbalta and  [name]  2) MS: -her spasticity improved since Dr. Jennye Moccasin started her on 0.'5mg'$  Lorazepam.  -progressing -has been having a lot of stiffness recently, we had discssed increasing the baclofen and this does appear to have helped -EMS called twice due to difficulty getting her up off the floor -is receiving home therapy -she would like to see a different neurologist and asked for my recommendation   3) Lymphedema  -has not been using garment recently -has significant bilateral lower extremity  -has greatly improved with lymphedema therapy -she recently received a device that has helped so much that she was able to get off of Lasix.  -has ordered compression garments -does not eat processed foods or salt.  -she has ordered something from [employer] to give her lymphatic massage.  -improving but still very bothersome to her -discussed bloodwork today  4) HTN: -BP 138/79  5) Decreased appetite:  -resolved with increasing [name]  6) She developed pain in right side, and not sure if this is due to topamax or the antibiotics she is on to treat a respiratory infection.   7) Insomnia:  -sleeping poorly due to her CPAP mask, pressure is rising at night and this is causing mask leak. -sleeping about 5 hours per night and has been referred for another sleep study  -respiratory therapist will seeing her tomorrow.  -she asks about Inspire.  -she follows with [name] and she says he is also a sleep doctor -she hates to reduce the medicine.  -she has tried 5 different masks -the machine is very loud.  -she can sleep without the CPAP fine.   8) Chest congestion -she has been having symptoms of congestion for greater than 3 months -Her father saw 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 She received a call to schedule an appointment with [name] office and is thankful for the referral.  -she asks what else she  can do to help with this -she has been through multiple rounds of antibiotics without benefit.  -also feeling diffuse joint aches.   9) GERD -has been experiencing after eating  10) Sinus congestion -pleased that her CT results were normal.   11) OSA -she notes that she was found to have OSA on recent sleep study and she was tearful about this as she feels it is just another condition she has -sleeping 5 hours per night and has been referred for another sleep study -she was surprised as she feels that she sleeps well.  -she has note been sleeping well while using the CPAP mask and was told that O2 is leaking -she is working with a respiratory therapist -she feels anxiety about whether she will get enough sleep tonight, she has only been sleeping about 4 hours since using the CPAP -she has decided she does not want to pursue Inspire yet  until she has learned more about it.  -she had a very productive visit with [name] and he advised her to try her best to tolerate the CPAP mask, which she is doing.  -followed up with pulmonology regarding [name] and was told she would be a good candidate for this treatment, but she should prefer to continue trying CPAP mask before trying Inspire  12) Chronic fatigue -she was diagnosed with COVID 9 days ago and her breathing has improved but she has been feeling very fatigued -she has been taking the N-acetyl-cysteine supplement -she asks about taking injetable B12 as this really helped her son -she has not been wearing her CPAP as she is waiting for a new mask  13) Pain -takes Tylenol and baclofen -would like to get Dysport next available appointment  14) Lower extremity weakness: -improved -went to ED and there was no evidence of stroke. Thought to be due to her Risperdal -her legs feel stiff -no other focal deficits -she started Risperdal on Wednesday night and asks if this could be causing her weakness -[name] has been trying to  wean her off Mirtazapine and Seroquel due to weight gain and onto Risperdal for her anxiety  15) Bilateral lower extremity pain -feels stiff and tight like spasticity -she asks if she can take extra Baclofen  16) depression --she has been doing better on the '150mg'$  Zoloft -there is another medication she would also like to try -she has been having a hard time getting in touch with her psychiatrist regarding the suicidal ideation she has been experiencing -progressing  17) frequent falls -she says she is being admitted to [facility] due to recent frequent falls. -she has not had any more falls since admission  18) Nausea -needs refill of Zofran  19) Confusion: -[name] has noted increased confusion since she was started on Gabapentin '100mg'$  up to 4 times per day as needed and Trazodone was increased to '100mg'$  at night  20) impaired mobility and ADLs -using lift to get in and out of wheelchair  Pain Inventory Average Pain 5 Pain Right Now 2 My pain is aching and no pain.  Sometimes spasm in legs at night because of MST  In the last 24 hours, has pain interfered with the following? General activity 4 Relation with others 7 Enjoyment of life 5  What TIME of day is your pain at its worst? night Sleep (in general) Fair  Pain is worse with: sitting and unsure Pain improves with: medication Relief from Meds: 5  Family History  Problem Relation Age of Onset   Arthritis Mother    Hypertension Mother    Macular degeneration Mother    Hypertension Father    Hyperlipidemia Father    Drug abuse Maternal Aunt    Heart disease Maternal Grandfather    Diabetes Maternal Grandfather    Kidney disease Paternal Grandmother    Social History   Socioeconomic History   Marital status: Married    Spouse name: Engineer, technical sales   Number of children: 3   Years of education: 14   Highest education level: Associate degree: academic program  Occupational History   Not on file  Tobacco Use   Smoking  status: Never   Smokeless tobacco: Never  Vaping Use   Vaping Use: Never used  Substance and Sexual Activity   Alcohol use: No    Alcohol/week: 0.0 standard drinks of alcohol   Drug use: No   Sexual activity: Not Currently  Other Topics Concern  Not on file  Social History Narrative   Lives with husband    Right handed   Caffeine: 1 cup of coffee in the AM, coke occa. Trying to come off caffeine    Social Determinants of Health   Financial Resource Strain: Low Risk  (01/07/2022)   Overall Financial Resource Strain (CARDIA)    Difficulty of Paying Living Expenses: Not hard at all  Food Insecurity: No Food Insecurity (01/07/2022)   Hunger Vital Sign    Worried About Running Out of Food in the Last Year: Never true    Ran Out of Food in the Last Year: Never true  Transportation Needs: No Transportation Needs (01/07/2022)   PRAPARE - Hydrologist (Medical): No    Lack of Transportation (Non-Medical): No  Physical Activity: Insufficiently Active (01/07/2022)   Exercise Vital Sign    Days of Exercise per Week: 2 days    Minutes of Exercise per Session: 60 min  Stress: No Stress Concern Present (01/07/2022)   [place]    Feeling of Stress : Not at all  Social Connections: Unknown (01/07/2022)   Social Connection and Isolation Panel [NHANES]    Frequency of Communication with Friends and Family: Not on file    Frequency of Social Gatherings with Friends and Family: Not on file    Attends Religious Services: Not on file    Active Member of Clubs or Organizations: Not on file    Attends Archivist Meetings: Not on file    Marital Status: Married   Past Surgical History:  Procedure Laterality Date   BACK SURGERY     CHOLECYSTECTOMY     COLONOSCOPY N/A 04/23/2022   Procedure: COLONOSCOPY;  Surgeon: Toledo, Benay Pike, MD;  Location: [facility] ENDOSCOPY;  Service: Gastroenterology;   Laterality: N/A;   COLONOSCOPY WITH PROPOFOL N/A 05/18/2017   Procedure: COLONOSCOPY WITH PROPOFOL;  Surgeon: [name], MD;  Location: [facility] ENDOSCOPY;  Service: Endoscopy;  Laterality: N/A;   FOOT SURGERY  2015   GALLBLADDER SURGERY  2008   HARDWARE REMOVAL Left 02/14/2016   Procedure: LEFT FOOT REMOVAL DEEP IMPLANT;  Surgeon: [name], MD;  Location: [place];  Service: Orthopedics;  Laterality: Left;   HERNIA REPAIR     Inguinal Hernia Repair   SPINE SURGERY  2014   Past Surgical History:  Procedure Laterality Date   BACK SURGERY     CHOLECYSTECTOMY     COLONOSCOPY N/A 04/23/2022   Procedure: COLONOSCOPY;  Surgeon: Toledo, Benay Pike, MD;  Location: [facility] ENDOSCOPY;  Service: Gastroenterology;  Laterality: N/A;   COLONOSCOPY WITH PROPOFOL N/A 05/18/2017   Procedure: COLONOSCOPY WITH PROPOFOL;  Surgeon: [name], MD;  Location[facility] ENDOSCOPY;  Service: Endoscopy;  Laterality: N/A;   FOOT SURGERY  2015   GALLBLADDER SURGERY  2008   HARDWARE REMOVAL Left 02/14/2016   Procedure: LEFT FOOT REMOVAL DEEP IMPLANT;  Surgeon: [name], MD;  Location: [place];  Service: Orthopedics;  Laterality: Left;   HERNIA REPAIR     Inguinal Hernia Repair   SPINE SURGERY  2014   Past Medical History:  Diagnosis Date   Allergy    Anxiety    Aspiration pneumonia (HCC)    Depression    Frequent headaches    H/O   GERD (gastroesophageal reflux disease)    History of chicken pox    History of colon polyps  Hx of migraines    Hypertension    Lymphedema    Multiple sclerosis ([place]) 2011   OSA (obstructive sleep apnea)    PONV (postoperative nausea and vomiting)    Scoliosis    Ht '5\' 3"'$  (1.6 m)   BMI 33.66 kg/m   Opioid Risk Score:   Fall Risk Score:  `1  Depression screen [facility] 2/9     11/11/2022   11:05 AM 10/28/2022    4:07 PM 05/22/2022    4:07 PM 04/09/2022   10:00 AM 01/07/2022    3:15 PM 11/05/2021   10:10 AM 08/27/2021   11:12 AM   Depression screen PHQ 2/9  Decreased Interest 0  3 1 0 0 2  Down, Depressed, Hopeless '1  3 1 '$ 0 1 2  PHQ - 2 Score '1  6 2 '$ 0 1 4  Altered sleeping   3 0     Tired, decreased energy   3 1     Change in appetite   3 0     Feeling bad or failure about yourself    3 0     Trouble concentrating   3 0     Moving slowly or fidgety/restless   0 0     Suicidal thoughts   1 0     PHQ-9 Score   22 3     Difficult doing work/chores   Somewhat difficult Not difficult at all        Information is confidential and restricted. Go to Review Flowsheets to unlock data.     Review of Systems  Constitutional: Negative.   HENT: Negative.    Eyes: Negative.   Respiratory:  Negative for cough.        Chest congestion  Cardiovascular: Negative.   Gastrointestinal: Negative.   Endocrine: Negative.   Genitourinary: Negative.   Musculoskeletal:  Positive for back pain and gait problem.  Skin: Negative.   Allergic/Immunologic: Negative.   Hematological:  Bruises/bleeds easily.       Plavix  Psychiatric/Behavioral:  Positive for dysphoric mood.   All other systems reviewed and are negative.      Objective:   Physical Exam Patient seen via phone     Assessment & Plan:  1) Anxiety: -discussed with patient the side effects of both Seroquel and Lorazepam, as well as potential interactions with other medications. -discussed benefits of eating animal products to increase serotonin levels -discussed benefits of exercise/movement throughout the day -will refer to ne psychologist and psychiatrist, indicated on referral because she prefers female provider -sent new referral to psychiatry and indicated on referral that patient has not been diagnosed with dementia -discussed improvements since Dr. Kerman Passey started her on 0.'5mg'$  Lorazepam at night -recommended drinking chamomile tea with saffron every evening -advised that saffron can be purchased from the spices section of her grocery section and just 1 strand  daily can be effective if mood elevation, and it is very good for health.  -recommended increasing Seroquel to '25mg'$  HS and adding an additional '25mg'$  during the day that can be taken once daily or split in half to be taken as 12.'5mg'$  BID PRN.  -discussed referral to psychiatry, placed referral to Dr. Sonny Dandy who was recommended to her by [name], discussed that it may be at least a week before she hears from them. -continue Zoloft, discussed that this will take 2-4 weeks to have effect, reinforced that she may not feel benefit from this yet. Discussed that not everyone response  to every medicine and there are alternative options we can try if this is not effective enough.  -discussed benefits of exercise and good nutrition -discussed her move because it is stressing her.  -discussed whether we should increase Seroquel due to her anxiety. Discussed that she is currently taking 12.'5mg'$  3 times per day PRN -discussed about her stress of seeing many different doctors -advised to always use the lowest dose of medications that she needs to minimize side effects -discussed the buspar that was started for her by Dr. Kerman Passey- she has not yet noted benefits from this, advised that she does not need to take it if she does not feel benefits.  -referred to psychiatry in [facility] for CBT -reinforced importance of thinking positively.  -continue to encourage non-pharmacologic management approaches as well, such as meditation, applying lavender oil, and exercise- all of which she has been trying and which have been helping.  -discussed alternative anxiety medications.  -continue seroquel 12.'5mg'$  daily and '25mg'$  at night. Can decreased to '25mg'$  at night and assess positive/negative effects during the day. Can restart 1/2 tab during the day if anxiety is uncontrolled.  -discontinue lorazepam.  -establish care with [name] for neuropsych counseling on 8/2. They really valued the appointment with him.   -discontinue topamax in case pain in right side is due to medication- discussed can also be due to antibiotics which she is taking for respiratory infection- discontinuing the topamax will help [place] know if it is the cause of her pain or not. -refilled [name] '30mg'$  HS.  -refilled Cymbalta '60mg'$  -Discussed exercise and meditation as tools to decrease anxiety. -Recommended Down Dog Yoga app -Discussed spending time outdoors. -Discussed positive re-framing of anxiety.  -Discussed the following foods that have been show to reduce anxiety: 1) [place] nuts, mushrooms, soy beans due to their high selenium content. Upper limit of toxicity of selenium is 47mg/day so no more than 3-4 bBolivianuts per day.  2) Fatty fish such as salmon, mackerel, sardines, trout, and herring- high in omega-3 fatty acids 3) Eggs- increases serotonin and dopamine 4) Pumpkin seeds- high in omega-3 fatty acids 5) dark chocolate- high in flavanols that increase blood flow to brain 6) turmeric- take with black pepper to increase absorption 7) chamomile tea- antioxidant and anti-inflammatory properties 8) yogurt without sugar- supports gut-brain axis 9) green tea- contains L- theanine 10) blueberries- high in vitamin C and antioxidants 11) t[place] high in tryptophan which gets converted to serotonin 12) bell peppers- rich in vitamin C and antioxidants 13) citrus fruits- rich in vitamin C and antioxidants 14) almonds- high in vitamin E and healthy fats 15) chia seeds- high in omega-3 fatty acids  2) MS -discussed improvements in anxiety since she was started on 0.'5mg'$  Lorazepam nightly  -continue home therapy to minimize stiffness/maximize mobility -f/u with MS specialist. Discussed her appointmenr with Dr. SKerman Passey  -provided with neurology referral -discussed trying lipoic acid -discussed the Wahls protocol  3) HTN:  -discussed good blood pressure previously BP has been 130s-150s/80s, recommended citrus foods and  nuts, as much mobility as she can tolerate, log Bps daily and bring log to f/u appointment.  -continue current regimen and checking and checking BP.  -advised citrus foods and nuts to help lower BP. Discussed that once BP is consistently 120/80 we can wean her Losartan to '25mg'$ .   4) Bilateral lower extremity edema: - continue lymphedema therapy which is greatly helping.  -Lasix discontinued. Discussed that this should also improve her kidney function -advised  low salt diet.  -prescribed new compression garments, will fax to company.  -discussed doing the compression garments early in the morning at 9am  -discussed that her kidney function is normal on last check in March.   5) Insomnia: -recommended tart cherry juice with dinner, valerian root and chamomile teas in evening, applying lavender drops to forehead at night. -recommended chamomile tea with saffron a couple hours before bedtime -continue [name] '30mg'$  HS- discussed that we will not try to wean off again since we saw how much this is benefittng her -discontinue amantadine.  -discussed considering the Inspire trial since he has tried the CPAP for a long time now and can not tolerate it.   6) Obesity BMI 33.13 -attempted to wean off [name] but she developed worsening insomnia, anxiety, and decreased appetite that was undesirable to her, so have restarted the medication.  -discussed trial of avoiding gluten for 3 months and how this could potentially help with her weight loss, anxiety, and prevention/slowing of her chronic diseases. -discussed benefits of intermittent fasting- made goal to set a consistent dinner time and avoid snacking after this time. If she is hungry, choose a healthy snack of nuts, fruit, or vegetables.  -provided a link to a pdf of Pete Escogue's musculoskeletal alignment exercises: [email or web address]   7) Chest congestion -referred for pulmonary  consult with [name], who treated her father for pulmonary fibrosis. She has made an appointment to establish care with him.  -recommended drinking daily warm water with honey, lime and ginger -recommended drinking tea with holy Basil (Tulsi)  8) Sore throat: -continue salt water gargling  9) Sinus congestion: -recommended humidifier -drink 6-8 glasses of water per day.   10) fatigue -checked B12, folate, TSH, vitamin D, magnesium and discussed bloodwork with patient and husband: all in normal range except for suboptimal Vitamin D- high dose supplement prescribed -Recommended continuing NAC (N-Acetyl cysteine) '600mg'$  BID for her fatigue. Discussed its benefits in boosting glutathione and mitochondrial function. -prescribed monthly injectable B12 as patient would like to try this. Discussed that there are few risks to having elevated B12 levels -discussed that not wearing her CPAP is also likely contributing to her fatigue -discussed modafinil but she prefers to try the B12 first   11) OSA -discussed that OSA can contribute to daytime fatigue, weight gain, anxiety, and chronic conditions so it is good to get this condition treated. -recommended follow-up sleep study for re-eval -referred to Dr. Melida Quitter for eval for [facility] given that she is having a hard time sleeping with her CPAP. Discussed her decision not to pursue Inspire at this time and instead try to tolerate her current mask as best as possible. Discussed her positive conversation with [name]. -advised her to talk to her respiratory therapist about fitting another mask -discussed Inspire but she says unfortunately it is not covered by insurance.  -discussed strap under the chin.  -discussed following up with pulmonology for Inspire.  -agreed with her decision to continue to try CPAP before pursuing Inspire due to her fears of the procedure  12) Low back pain: -recommended doTerra Deep Blue Essential oil  and applied to area of pain today- discussed that this is made of natural plant oils. Shared by personal experience of benefit from use of this essential oil -provided a link to a pdf of Pete Escogue's musculoskeletal alignment exercises: [email or web address]   13) Lower extremity weakness: -discussed that this could be a possible side effect of the Risperdal.  -Recommended calling  Dr. Garth Bigness office to let him know about the lower extremity weakness -discussed that she should go to the ED as unilateral lower extremity weakness could be a symptom of stroke or MS flare -discussed stopping Risperdal until she can talk with [name] -Discussed using Seroquel prn for anxiety in the meantime  14) Depression -discussed decreasing Zoloft dose back to '150mg'$  since this dose had been working well for her and since she has been experiencing suicidal ideations with higher dose -discussed that suicidal ideations are common side effects of depression medications, that sometimes these medications can work effectively to treat depression but other times they give patients the energy to act on negative thoughts -encouraged that she call her kids to let them know how important it is to her to be able to see her grandchildren -discussed ECT with her caregiver -discussed diet rich in protein/fruits/vegetables/and low in processed foods  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 -recommended heat, range of motion, use of baclofen -will plan for 500U Dysport next visit -recommended increasing Baclofen to '15mg'$  TID PRN  16) Frequent falls -discussed that her facility is planning to send her to her facility's SNF -discussed that she will need to have NFL2 form filled by her PCP -discussed that the rehab and exercise she gets there will also help with her depression  17) Nausea -refilled zofran  18) Suicidal thoughts -discussed that suicidal thoughts have decreased  since decreasing Zoloft to '150mg'$  -discussed follow-up with behavioral health on Friday  19) Confusion: -discussed that both gabapentin and trazodone can cause confusion -discussed that if she would like to taper off these medications she can decrease Trazodone to '50mg'$  at night and decrease Gabapentin by 1 tablet per day'  20) impaired mobility and ADLs -continue lift -continue PT  21) Swelling in bilateral lower extremiteis: -lasix ordered prn  22) spasticity Botox Injection for spasticity using needle ultrasound guidance  Indication: Severe spasticity which interferes with ADL,mobility and/or  hygiene and is unresponsive to medication management and other conservative care Informed consent was obtained after describing risks and benefits of the procedure with the patient. This includes bleeding, bruising, infection, excessive weakness, or medication side effects. A REMS form is on file and signed.  Soleus: 25U bilaterally Gastrocnemius: 25U bilateally  All injections were done after obtaining appropriate ultrasound visualization and after negative drawback for blood. The patient tolerated the procedure well. Post procedure instructions were given. A followup appointment was made.

## 2022-11-14 ENCOUNTER — Other Ambulatory Visit: Payer: Self-pay

## 2022-11-14 DIAGNOSIS — R059 Cough, unspecified: Secondary | ICD-10-CM

## 2022-11-18 DIAGNOSIS — G35 Multiple sclerosis: Secondary | ICD-10-CM | POA: Diagnosis not present

## 2022-11-18 DIAGNOSIS — R41841 Cognitive communication deficit: Secondary | ICD-10-CM | POA: Diagnosis not present

## 2022-11-18 DIAGNOSIS — Z8673 Personal history of transient ischemic attack (TIA), and cerebral infarction without residual deficits: Secondary | ICD-10-CM | POA: Diagnosis not present

## 2022-11-18 DIAGNOSIS — M6281 Muscle weakness (generalized): Secondary | ICD-10-CM | POA: Diagnosis not present

## 2022-11-18 DIAGNOSIS — R279 Unspecified lack of coordination: Secondary | ICD-10-CM | POA: Diagnosis not present

## 2022-11-18 DIAGNOSIS — R262 Difficulty in walking, not elsewhere classified: Secondary | ICD-10-CM | POA: Diagnosis not present

## 2022-11-18 DIAGNOSIS — Z9181 History of falling: Secondary | ICD-10-CM | POA: Diagnosis not present

## 2022-11-19 ENCOUNTER — Encounter: Payer: Medicare Other | Admitting: Psychology

## 2022-11-20 DIAGNOSIS — R262 Difficulty in walking, not elsewhere classified: Secondary | ICD-10-CM | POA: Diagnosis not present

## 2022-11-20 DIAGNOSIS — G35 Multiple sclerosis: Secondary | ICD-10-CM | POA: Diagnosis not present

## 2022-11-20 DIAGNOSIS — Z8673 Personal history of transient ischemic attack (TIA), and cerebral infarction without residual deficits: Secondary | ICD-10-CM | POA: Diagnosis not present

## 2022-11-20 DIAGNOSIS — R279 Unspecified lack of coordination: Secondary | ICD-10-CM | POA: Diagnosis not present

## 2022-11-20 DIAGNOSIS — M6281 Muscle weakness (generalized): Secondary | ICD-10-CM | POA: Diagnosis not present

## 2022-11-20 DIAGNOSIS — R41841 Cognitive communication deficit: Secondary | ICD-10-CM | POA: Diagnosis not present

## 2022-11-20 NOTE — Progress Notes (Signed)
[place] Phone: [phone] Subjective:   Fontaine No, am serving as a scribe for Dr. Hulan Saas.  I'm seeing this patient by the request  of:  Einar Pheasant, MD  CC: Back pain, hand pain, allover pain  [id number]  Carly [name] is a 72 y.o. female coming in with complaint of neck and R shoulder pain for past 6 months. Insidious onset. Pain is constant. Hx of scoliosis and lumbar spine surgery in 2012. Was swimming for exercise. TENS unit gives her some relief. Denies any radiating symptoms. Using Tylenol, gabapentin, and baclofen.   Noticed swelling in hand, fingers and forearm on right side for past 2 weeks. Unable to open and close hand due to swelling and pain.        Past Medical History:  Diagnosis Date   Allergy    Anxiety    Aspiration pneumonia (HCC)    Depression    Frequent headaches    H/O   GERD (gastroesophageal reflux disease)    History of chicken pox    History of colon polyps    Hx of migraines    Hypertension    Lymphedema    Multiple sclerosis ([place]) 2011   OSA (obstructive sleep apnea)    PONV (postoperative nausea and vomiting)    Scoliosis    Past Surgical History:  Procedure Laterality Date   BACK SURGERY     CHOLECYSTECTOMY     COLONOSCOPY N/A 04/23/2022   Procedure: COLONOSCOPY;  Surgeon: Toledo, Benay Pike, MD;  Location: [facility] ENDOSCOPY;  Service: Gastroenterology;  Laterality: N/A;   COLONOSCOPY WITH PROPOFOL N/A 05/18/2017   Procedure: COLONOSCOPY WITH PROPOFOL;  Surgeon: [name], MD;  Location: [facility] ENDOSCOPY;  Service: Endoscopy;  Laterality: N/A;   FOOT SURGERY  2015   GALLBLADDER SURGERY  2008   HARDWARE REMOVAL Left 02/14/2016   Procedure: LEFT FOOT REMOVAL DEEP IMPLANT;  Surgeon: [name], MD;  Location: [place];  Service: Orthopedics;  Laterality: Left;   HERNIA REPAIR     Inguinal Hernia Repair   SPINE SURGERY   2014   Social History   Socioeconomic History   Marital status: Married    Spouse name: Bill   Number of children: 3   Years of education: 14   Highest education level: Associate degree: academic program  Occupational History   Not on file  Tobacco Use   Smoking status: Never   Smokeless tobacco: Never  Vaping Use   Vaping Use: Never used  Substance and Sexual Activity   Alcohol use: No    Alcohol/week: 0.0 standard drinks of alcohol   Drug use: No   Sexual activity: Not Currently  Other Topics Concern   Not on file  Social History Narrative   Lives with husband    Right handed   Caffeine: 1 cup of coffee in the AM, coke occa. Trying to come off caffeine    Social Determinants of Health   Financial Resource Strain: Low Risk  (01/07/2022)   Overall Financial Resource Strain (CARDIA)    Difficulty of Paying Living Expenses: Not hard at all  Food Insecurity: No Food Insecurity (01/07/2022)   Hunger Vital Sign    Worried About Running Out of Food in the Last Year: Never true    Ran Out of Food in the Last Year: Never true  Transportation Needs: No Transportation Needs (01/07/2022)   PRAPARE - Transportation  Lack of Transportation (Medical): No    Lack of Transportation (Non-Medical): No  Physical Activity: Insufficiently Active (01/07/2022)   Exercise Vital Sign    Days of Exercise per Week: 2 days    Minutes of Exercise per Session: 60 min  Stress: No Stress Concern Present (01/07/2022)   [place]    Feeling of Stress : Not at all  Social Connections: Unknown (01/07/2022)   Social Connection and Isolation Panel [NHANES]    Frequency of Communication with Friends and Family: Not on file    Frequency of Social Gatherings with Friends and Family: Not on file    Attends Religious Services: Not on file    Active Member of Clubs or Organizations: Not on file    Attends Archivist Meetings: Not on  file    Marital Status: Married   Allergies  Allergen Reactions   Ibuprofen Swelling and Other (See Comments)   Sulfamethoxazole-Trimethoprim Itching   Penicillin G Rash   Asa [Aspirin] Swelling   Buspirone Other (See Comments)    Headaches     Levofloxacin Other (See Comments)    "weakness"   Lexapro [Escitalopram Oxalate] Other (See Comments)    Weakness    Pollen Extract Hives   Ceftin [Cefuroxime Axetil] Rash   Penicillins Rash   Sulfa Antibiotics Rash   Family History  Problem Relation Age of Onset   Arthritis Mother    Hypertension Mother    Macular degeneration Mother    Hypertension Father    Hyperlipidemia Father    Drug abuse Maternal Aunt    Heart disease Maternal Grandfather    Diabetes Maternal Grandfather    Kidney disease Paternal Grandmother     Current Outpatient Medications (Endocrine & Metabolic):    predniSONE (DELTASONE) 10 MG tablet, TAKE 3 TABLETS PO QD FOR 3 DAYS THEN TAKE 2 TABLETS PO QD FOR 3 DAYS THEN TAKE 1 TABLET PO QD FOR 3 DAYS THEN TAKE 1/2 TAB PO QD FOR 3 DAYS   Current Outpatient Medications (Cardiovascular):    amLODipine (NORVASC) 5 MG tablet, TAKE 1 TABLET BY MOUTH DAILY.   losartan (COZAAR) 50 MG tablet, TAKE 1 TABLET BY MOUTH DAILY   rosuvastatin (CRESTOR) 5 MG tablet, TAKE 1 TABLET BY MOUTH EVERY MONDAY, WEDNESDAY AND FRIDAY.   Current Outpatient Medications (Respiratory):    fluticasone (FLONASE) 50 MCG/ACT nasal spray, Place into the nose.   loratadine (CLARITIN) 10 MG tablet, Take 10 mg by mouth daily.   sodium chloride (OCEAN) 0.65 % nasal spray, Place 1-2 sprays into the nose as needed.     Facility-Administered Medications Ordered in Other Visits (Analgesics):    acetaminophen (TYLENOL) tablet 650 mg  Current Outpatient Medications (Hematological):    clopidogrel (PLAVIX) 75 MG tablet, TAKE 1 TABLET (75 MG) BY MOUTH EVERY DAY   cyanocobalamin (,VITAMIN B-12,) 1000 MCG/ML injection, Inject 1 mL (1,000 mcg total)  into the muscle every 30 (thirty) days.   Current Outpatient Medications (Other):    baclofen (LIORESAL) 10 MG tablet, Takes 69m q 4 hours   buPROPion (WELLBUTRIN XL) 150 MG 24 hr tablet, Take 1 tablet (150 mg total) by mouth daily.   busPIRone (BUSPAR) 15 MG tablet, TAKE 1 TABLET BY MOUTH 3 TIMES DAILY   Cholecalciferol (D3-1000 PO), Take 2,000 Units by mouth daily.   Coenzyme Q10 (COQ10 PO), Take by mouth.   dalfampridine 10 MG TB12, Take 1 tablet (10 mg total) by mouth every 12 (  twelve) hours.   docusate sodium (COLACE) 100 MG capsule, Take 100 mg by mouth 2 (two) times daily.   gabapentin (NEURONTIN) 100 MG capsule, Take 218m in the morning, 2045min the afternoon and 20059mt bedtime.   L-Methylfolate 15 MG TABS, Take 1 tablet (15 mg total) by mouth daily.   L-Methylfolate 15 MG TABS, Take 1 tablet (15 mg total) by mouth daily.   L-Methylfolate-Algae (DEPLIN 15 PO), Take by mouth.   LORazepam (ATIVAN) 0.5 MG tablet, TAKE 1 TABLET BY MOUTH EVERY 8 HOURS AS DIRECTED   MAGNESIUM GLYCINATE PO, Take by mouth.   METAMUCIL FIBER PO, Take by mouth 2 (two) times daily.   nystatin cream (MYCOSTATIN), Apply 1 application topically 2 (two) times daily.   ondansetron (ZOFRAN) 4 MG tablet, Take 1 tablet (4 mg total) by mouth every 8 (eight) hours as needed for nausea or vomiting.   pantoprazole (PROTONIX) 40 MG tablet, TWICE DAILY AT 7AM AND 5PM   saccharomyces boulardii (FLORASTOR) 250 MG capsule, Take 1 capsule (250 mg total) by mouth 2 (two) times daily.   sertraline (ZOLOFT) 100 MG tablet, Take 150 mg by mouth daily.   topiramate (TOPAMAX) 25 MG tablet, Take 1 tablet (25 mg total) by mouth at bedtime.   Vitamin D, Ergocalciferol, (DRISDOL) 1.25 MG (50000 UNIT) CAPS capsule, Take 1 capsule (50,000 Units total) by mouth every 7 (seven) days.   Facility-Administered Medications Ordered in Other Visits (Other):    0.9 %  sodium chloride infusion No current facility-administered medications for  this visit.   Reviewed prior external information including notes and imaging from  primary care provider As well as notes that were available from care everywhere and other healthcare systems.  Reviewed patient's imaging including CT abdomen and pelvis that was done in December of last year showing only the interbody fusion at L2-S1 is quite significant.  Patient does have some osteopenia noted.  Past medical history, social, surgical and family history all reviewed in electronic medical record.  No pertanent information unless stated regarding to the chief complaint.   Review of Systems:  No headache, visual changes, nausea, vomiting, diarrhea, constipation, dizziness, abdominal pain, skin rash, fevers, chills, night sweats, weight loss, swollen lymph nodes, body aches, joint swelling, chest pain, shortness of breath, mood changes. POSITIVE muscle aches  Objective  Blood pressure (!) 112/58, pulse 94, height 5' 3"$  (1.6 m), SpO2 96 %.   General: No apparent distress alert and oriented x3 mood and affect normal, dressed appropriately.  HEENT: Pupils equal, extraocular movements intact  Patient is in wheelchair.  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  Patient's right side though does have difficulty with extension of the hand noted.  Last aspirin does have some mild extremity swelling noted at the right hand compared to the left. Extremities some dependent edema.  Patient has had bilateral lower extremity patient patient does have significant weakness noted of the left foot with any type of movement whatsoever.    Impression and Recommendations:     The above documentation has been reviewed and is accurate and complete ZacLyndal PulleyO

## 2022-11-21 ENCOUNTER — Ambulatory Visit: Payer: Medicare Other | Admitting: Nurse Practitioner

## 2022-11-21 ENCOUNTER — Ambulatory Visit: Payer: Medicare Other | Admitting: Internal Medicine

## 2022-11-21 ENCOUNTER — Ambulatory Visit (INDEPENDENT_AMBULATORY_CARE_PROVIDER_SITE_OTHER): Payer: Medicare Other | Admitting: Nurse Practitioner

## 2022-11-21 ENCOUNTER — Encounter: Payer: Self-pay | Admitting: Nurse Practitioner

## 2022-11-21 VITALS — BP 130/80 | HR 91 | Temp 98.4°F | Ht 63.0 in

## 2022-11-21 DIAGNOSIS — J069 Acute upper respiratory infection, unspecified: Secondary | ICD-10-CM | POA: Insufficient documentation

## 2022-11-21 DIAGNOSIS — G35 Multiple sclerosis: Secondary | ICD-10-CM | POA: Diagnosis not present

## 2022-11-21 DIAGNOSIS — R41841 Cognitive communication deficit: Secondary | ICD-10-CM | POA: Diagnosis not present

## 2022-11-21 DIAGNOSIS — Z8673 Personal history of transient ischemic attack (TIA), and cerebral infarction without residual deficits: Secondary | ICD-10-CM | POA: Diagnosis not present

## 2022-11-21 DIAGNOSIS — M79641 Pain in right hand: Secondary | ICD-10-CM | POA: Diagnosis not present

## 2022-11-21 DIAGNOSIS — R279 Unspecified lack of coordination: Secondary | ICD-10-CM | POA: Diagnosis not present

## 2022-11-21 DIAGNOSIS — M6281 Muscle weakness (generalized): Secondary | ICD-10-CM | POA: Diagnosis not present

## 2022-11-21 DIAGNOSIS — R262 Difficulty in walking, not elsewhere classified: Secondary | ICD-10-CM | POA: Diagnosis not present

## 2022-11-21 DIAGNOSIS — J988 Other specified respiratory disorders: Secondary | ICD-10-CM | POA: Insufficient documentation

## 2022-11-21 MED ORDER — PREDNISONE 10 MG PO TABS: ORAL_TABLET | ORAL | 0 refills | Status: DC

## 2022-11-21 NOTE — Assessment & Plan Note (Signed)
Swelling, decreased ROM and decreased strength in right hand noted on exam. Patient and caregiver unsure if there has been a recent injury. Has been using ice at home. Will get xray and contact patient with results. She can continue ice, compression and elevation. Encouraged follow up with Dr. Ranell Patrick, Physical Medicine and Rehab if symptoms persist.

## 2022-11-21 NOTE — Progress Notes (Signed)
Carly Morrow, NP-C Phone: [phone]  Carly [name] is a 72 y.o. female who presents today for ear pain and right hand swelling.   Ear Pain: Patient presents with right ear pain.  Symptoms include right ear pain, congestion, and cough. Symptoms began 3 weeks ago and are unchanged since that time. Patient denies dyspnea, fever, and sore throat.  She also notes no hearing loss and no drainage.  She does not have a history of ear infections.    Right hand swelling- Reports right hand pain and swelling x 3 weeks. Unsure if there was an injury that occurred. She does have decreased strength and mobility in that hand and arm.   Social History   Tobacco Use  Smoking Status Never  Smokeless Tobacco Never    Current Outpatient Medications on File Prior to Visit  Medication Sig Dispense Refill   amLODipine (NORVASC) 5 MG tablet TAKE 1 TABLET BY MOUTH DAILY. 90 tablet 1   baclofen (LIORESAL) 10 MG tablet Takes 58m q 4 hours 180 each 5   buPROPion (WELLBUTRIN XL) 150 MG 24 hr tablet Take 1 tablet (150 mg total) by mouth daily. 30 tablet 1   busPIRone (BUSPAR) 15 MG tablet TAKE 1 TABLET BY MOUTH 3 TIMES DAILY 90 tablet 1   Cholecalciferol (D3-1000 PO) Take 2,000 Units by mouth daily.     clopidogrel (PLAVIX) 75 MG tablet TAKE 1 TABLET (75 MG) BY MOUTH EVERY DAY 90 tablet 1   Coenzyme Q10 (COQ10 PO) Take by mouth.     cyanocobalamin (,VITAMIN B-12,) 1000 MCG/ML injection Inject 1 mL (1,000 mcg total) into the muscle every 30 (thirty) days. 1 mL 3   dalfampridine 10 MG TB12 Take 1 tablet (10 mg total) by mouth every 12 (twelve) hours. 180 tablet 3   docusate sodium (COLACE) 100 MG capsule Take 100 mg by mouth 2 (two) times daily.     fluticasone (FLONASE) 50 MCG/ACT nasal spray Place into the nose.     gabapentin (NEURONTIN) 100 MG capsule Take 2057min the morning, 20080mn the afternoon and 200m30m bedtime. 180 capsule 5   L-Methylfolate 15 MG TABS Take 1 tablet (15 mg total) by mouth  daily. 30 tablet 1   L-Methylfolate 15 MG TABS Take 1 tablet (15 mg total) by mouth daily. 90 tablet 0   L-Methylfolate-Algae (DEPLIN 15 PO) Take by mouth.     loratadine (CLARITIN) 10 MG tablet Take 10 mg by mouth daily.     LORazepam (ATIVAN) 0.5 MG tablet TAKE 1 TABLET BY MOUTH EVERY 8 HOURS AS DIRECTED 90 tablet 3   losartan (COZAAR) 50 MG tablet TAKE 1 TABLET BY MOUTH DAILY 90 tablet 1   MAGNESIUM GLYCINATE PO Take by mouth.     METAMUCIL FIBER PO Take by mouth 2 (two) times daily.     nystatin cream (MYCOSTATIN) Apply 1 application topically 2 (two) times daily. 30 g 11   ondansetron (ZOFRAN) 4 MG tablet Take 1 tablet (4 mg total) by mouth every 8 (eight) hours as needed for nausea or vomiting. 20 tablet 0   pantoprazole (PROTONIX) 40 MG tablet TWICE DAILY AT 7AM AND 5PM 62 tablet 1   rosuvastatin (CRESTOR) 5 MG tablet TAKE 1 TABLET BY MOUTH EVERY MONDAY, WEDNESDAY AND FRIDAY. 90 tablet 1   saccharomyces boulardii (FLORASTOR) 250 MG capsule Take 1 capsule (250 mg total) by mouth 2 (two) times daily. 60 capsule 0   sertraline (ZOLOFT) 100 MG tablet Take 150 mg by  mouth daily. (Patient not taking: Reported on 11/11/2022)     sodium chloride (OCEAN) 0.65 % nasal spray Place 1-2 sprays into the nose as needed.     topiramate (TOPAMAX) 25 MG tablet Take 1 tablet (25 mg total) by mouth at bedtime. (Patient not taking: Reported on 11/11/2022) 90 tablet 3   Vitamin D, Ergocalciferol, (DRISDOL) 1.25 MG (50000 UNIT) CAPS capsule Take 1 capsule (50,000 Units total) by mouth every 7 (seven) days. 20 capsule 0   Current Facility-Administered Medications on File Prior to Visit  Medication Dose Route Frequency Provider Last Rate Last Admin   0.9 %  sodium chloride infusion   Intravenous Continuous Lequita Asal, MD 10 mL/hr at 04/23/22 1127 Continued from Pre-op at 04/23/22 1127   acetaminophen (TYLENOL) tablet 650 mg  650 mg Oral Once Lequita Asal, MD         ROS see history of present  illness  Objective  Physical Exam Vitals:   11/21/22 1604  BP: 130/80  Pulse: 91  Temp: 98.4 F (36.9 C)  SpO2: 97%    BP Readings from Last 3 Encounters:  11/21/22 130/80  11/13/22 132/75  10/24/22 136/72   Wt Readings from Last 3 Encounters:  11/11/22 190 lb (86.2 kg)  10/24/22 189 lb 12.8 oz (86.1 kg)  09/30/22 172 lb (78 kg)    Physical Exam Constitutional:      General: She is not in acute distress.    Appearance: Normal appearance.  HENT:     Head: Normocephalic.     Right Ear: Tympanic membrane, ear canal and external ear normal.     Left Ear: Tympanic membrane, ear canal and external ear normal.     Nose: Nose normal.     Mouth/Throat:     Mouth: Mucous membranes are moist.     Pharynx: Oropharynx is clear.  Eyes:     Conjunctiva/sclera: Conjunctivae normal.     Pupils: Pupils are equal, round, and reactive to light.  Cardiovascular:     Rate and Rhythm: Normal rate and regular rhythm.     Heart sounds: Normal heart sounds.  Pulmonary:     Effort: Pulmonary effort is normal.     Breath sounds: Normal breath sounds.  Musculoskeletal:     Right hand: Swelling (dorsal) present. Decreased range of motion. Decreased strength.  Lymphadenopathy:     Cervical: No cervical adenopathy.  Skin:    General: Skin is warm and dry.  Neurological:     General: No focal deficit present.     Mental Status: She is alert.  Psychiatric:        Mood and Affect: Mood normal.        Behavior: Behavior normal.    Assessment/Plan: Please see individual problem list.  Upper respiratory tract infection, unspecified type Assessment & Plan: Symptoms consistent with URI. Will treat with Prednisone taper due to length of patient's symptoms. There is a referral already in place for Pulmonology. Chest xray reviewed, consistent with viral infection. Lungs CTA on exam. Bilateral ears WNL on exam, no sign of infection. Encouraged adequate fluid intake. Continue Flonase and  Claritin.  Orders: -     predniSONE; TAKE 3 TABLETS PO QD FOR 3 DAYS THEN TAKE 2 TABLETS PO QD FOR 3 DAYS THEN TAKE 1 TABLET PO QD FOR 3 DAYS THEN TAKE 1/2 TAB PO QD FOR 3 DAYS  Dispense: 20 tablet; Refill: 0  Right hand pain Assessment & Plan: Swelling, decreased ROM and decreased  strength in right hand noted on exam. Patient and caregiver unsure if there has been a recent injury. Has been using ice at home. Will get xray and contact patient with results. She can continue ice, compression and elevation. Encouraged follow up with Dr. Ranell Patrick, Physical Medicine and Rehab if symptoms persist.   Orders: -     DG Hand Complete Right; Future   Return if symptoms worsen or fail to improve.   Carly Morrow, NP-C [place]

## 2022-11-21 NOTE — Assessment & Plan Note (Signed)
Symptoms consistent with URI. Will treat with Prednisone taper due to length of patient's symptoms. There is a referral already in place for Pulmonology. Chest xray reviewed, consistent with viral infection. Lungs CTA on exam. Bilateral ears WNL on exam, no sign of infection. Encouraged adequate fluid intake. Continue Flonase and Claritin.

## 2022-11-24 ENCOUNTER — Ambulatory Visit (INDEPENDENT_AMBULATORY_CARE_PROVIDER_SITE_OTHER): Payer: Medicare Other

## 2022-11-24 ENCOUNTER — Ambulatory Visit (INDEPENDENT_AMBULATORY_CARE_PROVIDER_SITE_OTHER): Payer: Medicare Other | Admitting: Family Medicine

## 2022-11-24 ENCOUNTER — Encounter: Payer: Self-pay | Admitting: Family Medicine

## 2022-11-24 ENCOUNTER — Other Ambulatory Visit: Payer: Self-pay | Admitting: Psychiatry

## 2022-11-24 VITALS — BP 112/58 | HR 94 | Ht 63.0 in

## 2022-11-24 DIAGNOSIS — M79641 Pain in right hand: Secondary | ICD-10-CM

## 2022-11-24 DIAGNOSIS — M810 Age-related osteoporosis without current pathological fracture: Secondary | ICD-10-CM | POA: Diagnosis not present

## 2022-11-24 DIAGNOSIS — M859 Disorder of bone density and structure, unspecified: Secondary | ICD-10-CM

## 2022-11-24 DIAGNOSIS — M47812 Spondylosis without myelopathy or radiculopathy, cervical region: Secondary | ICD-10-CM | POA: Diagnosis not present

## 2022-11-24 DIAGNOSIS — M542 Cervicalgia: Secondary | ICD-10-CM

## 2022-11-24 DIAGNOSIS — M4802 Spinal stenosis, cervical region: Secondary | ICD-10-CM | POA: Diagnosis not present

## 2022-11-24 DIAGNOSIS — G35 Multiple sclerosis: Secondary | ICD-10-CM

## 2022-11-24 DIAGNOSIS — M7989 Other specified soft tissue disorders: Secondary | ICD-10-CM | POA: Diagnosis not present

## 2022-11-24 DIAGNOSIS — G35A Relapsing-remitting multiple sclerosis: Secondary | ICD-10-CM

## 2022-11-24 NOTE — Assessment & Plan Note (Signed)
Concern with patient's multiple sclerosis the patient is worsening at this time.  Patient is actually having significant difficulty with some of the different movements noted.  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  Patient has some swelling of the right hand but I think it is secondary to more of the positioning at this moment with patient not being as active.  We did discuss with patient about due to the different medications over the course of time I am concerned with patient's bone density and lack of movement so we will get a bone density exam as well.  Depending on this there is reasonable chance for [place] to be able to help with some of the pain if this is contributing to it.  We discussed CoQ 10 to help with stabilization of some of the muscle fibers.  Follow-up with me again 2 months after starting this as well as aquatic formal physical therapy.  Total time reviewing patient's notes, discussing medications, discussing with family 72 minutes.

## 2022-11-24 NOTE — Patient Instructions (Addendum)
Xray today Aquatic PT [facility] CoQ10 230m daily DEXA scan  See me in 2 months

## 2022-11-24 NOTE — Assessment & Plan Note (Signed)
Patient is having worsening constriction in the hands bilaterally with some swelling of the right hand patient is with caregiver as well as husband and they are trying to avoid medications.  Discussed with patient as well as aquatic therapy that could be beneficial.  Follow-up again in  8 weeks

## 2022-11-27 DIAGNOSIS — R262 Difficulty in walking, not elsewhere classified: Secondary | ICD-10-CM | POA: Diagnosis not present

## 2022-11-27 DIAGNOSIS — R41841 Cognitive communication deficit: Secondary | ICD-10-CM | POA: Diagnosis not present

## 2022-11-27 DIAGNOSIS — G35 Multiple sclerosis: Secondary | ICD-10-CM | POA: Diagnosis not present

## 2022-11-27 DIAGNOSIS — R279 Unspecified lack of coordination: Secondary | ICD-10-CM | POA: Diagnosis not present

## 2022-11-27 DIAGNOSIS — M6281 Muscle weakness (generalized): Secondary | ICD-10-CM | POA: Diagnosis not present

## 2022-11-27 DIAGNOSIS — Z8673 Personal history of transient ischemic attack (TIA), and cerebral infarction without residual deficits: Secondary | ICD-10-CM | POA: Diagnosis not present

## 2022-11-28 ENCOUNTER — Ambulatory Visit (INDEPENDENT_AMBULATORY_CARE_PROVIDER_SITE_OTHER): Payer: Medicare Other | Admitting: Clinical

## 2022-11-28 DIAGNOSIS — F419 Anxiety disorder, unspecified: Secondary | ICD-10-CM | POA: Diagnosis not present

## 2022-11-28 DIAGNOSIS — F331 Major depressive disorder, recurrent, moderate: Secondary | ICD-10-CM

## 2022-11-28 NOTE — Progress Notes (Signed)
[facility] Counselor/Therapist Progress Note  Patient ID: Carly [name], MRN: [id number]    Date: 11/28/22  Time Spent: 10:32  am - 11:21 am : 84 Minutes  Treatment Type: Individual Therapy.  Reported Symptoms: Patient reported experiencing worry  Mental Status Exam: Appearance:  Could not assess      Behavior: Could not assess  Motor: Could not assess  Speech/Language:  Clear and Coherent  Affect: Could not assess  Mood: normal  Thought process: normal  Thought content:   WNL  Sensory/Perceptual disturbances:   WNL  Orientation: oriented to person, place, and situation  Attention: Good  Concentration: Good  Memory: WNL  Fund of knowledge:  Good  Insight:   Fair  Judgment:  Good  Impulse Control: Good   Risk Assessment: Danger to Self:  No Patient denied current suicidal ideation Self-injurious Behavior: No Danger to Others: No Patient denied current homicidal ideation Duty to Warn:no Physical Aggression / Violence:No  Access to Firearms a concern: No  Gang Involvement:No   Subjective:  Patient stated, "pretty good today" in response to current mood. Patient reported she is unable to use her right hand due to arthritis and stated, "its driving me crazy". Patient reported recent appointment with a physician to address issues related to her hand. Patient reported she has additional questions about her hand.  Patient reported she is open to following up with the physician regarding her hand. Patient reported she was taking lorazepam for approximately 1 year and reported feeling the medication helped with anxiety. Patient stated, "its calms me down if I get upset". Patient reported she is out of medication. Patient reported she plans to talk with the pharmacy about the medication or the physician that prescribed the medication. Patient stated, "I want to be able to handle things better, not get so upset" in response to goals for therapy. Patient stated,   "reason things out better" in response to goals for therapy. Patient reported she worries that she will require a lift for the foreseeable future. Patient stated, "I've been worried about so much".   Interventions: Motivational Interviewing. Clinician conducted session via WebEx audio from clinician's home office due to video component of patient's WebEx not working. Patient provided verbal consent to proceed with telehealth session and participated in session from patient's home. Patient provided verbal consent for caregiver and husband to be present during session. Discussed patient's concerns related to her hand and discussed patient having a conversation with her physician to address her concerns. Discussed patient's concerns related to being out of medication and discussed patient following up with the pharmacy or the prescriber of the medication to address her concerns. Assisted patient with problem solving.  Clinician utilized motivational interviewing to explore potential goals for therapy. Clinician utilized a task centered approach in collaboration with patient to begin to develop goals for therapy.   Diagnosis:  Major depressive disorder, recurrent episode, moderate (HCC)  Anxiety disorder, unspecified type   Plan: Goals to be finalized during follow up appointment on 12/12/22.                       Katherina Right, LCSW

## 2022-12-01 ENCOUNTER — Other Ambulatory Visit: Payer: Medicare Other | Admitting: Nurse Practitioner

## 2022-12-01 ENCOUNTER — Encounter: Payer: Self-pay | Admitting: Nurse Practitioner

## 2022-12-01 DIAGNOSIS — R5381 Other malaise: Secondary | ICD-10-CM

## 2022-12-01 DIAGNOSIS — G35 Multiple sclerosis: Secondary | ICD-10-CM

## 2022-12-01 DIAGNOSIS — Z515 Encounter for palliative care: Secondary | ICD-10-CM | POA: Diagnosis not present

## 2022-12-01 DIAGNOSIS — F4321 Adjustment disorder with depressed mood: Secondary | ICD-10-CM | POA: Diagnosis not present

## 2022-12-01 NOTE — Progress Notes (Signed)
[facility] [facility] Palliative Care Consult Note Telephone: [phone]  Fax: [phone]    Date of encounter: 12/01/22 7:50 PM PATIENT NAME: Carly [name] [place] [address]   [phone] (home)  DOB: 12-Aug-1951 MRN: [id number] PRIMARY CARE PROVIDER:    Einar Pheasant, MD,  [id number] [place] [id number] [phone]  RESPONSIBLE PARTY:    Contact Information     Name Relation Home Work Mobile   Riles,Bill Spouse [phone]  [phone]   Nigel Berthold   [phone]      I met face to face with patient in home. Palliative Care was asked to follow this patient by consultation request of  Einar Pheasant, MD [facility] with husband to address advance care planning and complex medical decision making. This is a follow up visit.                                  ASSESSMENT AND PLAN / RECOMMENDATIONS:  Symptom Management/Plan: 1. Advance Care Planning;  Full code 2. Goals of Care: Goals include to maximize quality of life and symptom management. Our advance care planning conversation included a discussion about:    The value and importance of advance care planning  Exploration of personal, cultural or spiritual beliefs that might influence medical decisions  Exploration of goals of care in the event of a sudden injury or illness  Identification and preparation of a healthcare agent  Review and updating or creation of an advance directive document. 3. Palliative care encounter; Palliative care encounter; Palliative medicine team will continue to support patient, patient's family, and medical team. Visit consisted of counseling and education dealing with the complex and emotionally intense issues of symptom management and palliative care in the setting of serious and potentially life-threatening illness   4. Debility/generalized weakness secondary to  MS; decline, discussed at length about functional abilities, using a sit/stand lift. We talked about the difficulty of using the lift, her embarrassment of lift. We talked about safety, fall precautions. \  5. Grief reaction secondary to progressive MS, functional and cognitive decline. We talked about grief, coping strategies and recommended grief counselor will ask chaplain at [facility] to schedule f/u visit. Mr and Mrs Myklebust in agreement.    Follow up Palliative Care Visit: PC f/u visit further discussion monitor trends of appetite, weights, monitor for functional, cognitive decline with chronic disease progression, assess any active symptoms, supportive role.Palliative care will continue to follow for complex medical decision making, advance care planning, and clarification of goals. Return 4 to 8 weeks or prn.   I spent 61 minutes providing this consultation starting at 3:00pm. More than 50% of the time in this consultation was spent in counseling and care coordination. PPS: 40% Chief Complaint: Follow up palliative consult for complex medical decision making, address goals, manage ongoing symptoms   HISTORY OF PRESENT ILLNESS:  Carly [name] is a 72 y.o. year old female  with multiple medical problems including  MS, depression, anxiety, hypertension, OSA, scoliosis, GERD, headaches, lymphedema.  Purpose of today PC f/u visit further discussion monitor trends of appetite, weights, monitor for functional, cognitive decline with chronic disease progression, assess any active symptoms, supportive role. I visited Ms and Mr Mcconnico, with caregiver in their home. Ms Cervantes was sitting in the chair with her feet up. Felipe Drone NP Student Erling Cruz and I visited  Mr and Mrs Sineath in their home. We talked about ros, functional changes of requiring sit to stand lift. Ms Minehart has been very upset about having to use the lift. We talked about the importance of safety of her and her caregivers. We talked  about disease progression of MS, ros, functional and cognitive decline. We talked about grieving, discussed option of pc chaplain visits for with grief counselor. We talked about expectations. We talked about visit with Dr Tamala Julian which Ms Linville was very pleased with visit and is looking forward to the next visit. We talked about the daily struggles. Discussed pain. We talked about importance of sleep patterns, energy conservation. We talked about appetite, no noted weight loss. No difficulty with speech or swallowing currently. We talked about daily challenges she experiences. We talked about grieving, coping strategies. Ms Allegra endorses thankful for pc visit. Mr Fitzner and I talked separately about overall decline, realistic expectations and disease progression. We talked about options with further decline. Therapeutic listening, emotional support provided. Questions answered. Contact information provided.   History obtained from review of EMR, discussion with Mr and Ms. Lenington with caregiver. I reviewed available labs, medications, imaging, studies and related documents from the EMR.  Records reviewed and summarized above.  Physical Exam: General: pleasant female ENMT: oral mucous membranes moist CV: S1S2, RRR Pulmonary: decreased bases otherwise clear Neuro:  + generalized weakness,  + cognitive impairment Psych: non-anxious affect, A and O x 3, forgetful, tearful Thank you for the opportunity to participate in the care of Ms. Falls. Please call our office at [phone] if we can be of additional assistance.   Bridgit Eynon Ihor Gully, NP

## 2022-12-04 NOTE — Progress Notes (Deleted)
[place] MD/PA/NP OP Progress Note  12/04/2022 5:51 PM Carly [name]  MRN:  [id number]  Chief Complaint: No chief complaint on file.  HPI: *** Visit Diagnosis: No diagnosis found.  Past Psychiatric History: Please see initial evaluation for full details. I have reviewed the history. No updates at this time.     Past Medical History:  Past Medical History:  Diagnosis Date   Allergy    Anxiety    Aspiration pneumonia (HCC)    Depression    Frequent headaches    H/O   GERD (gastroesophageal reflux disease)    History of chicken pox    History of colon polyps    Hx of migraines    Hypertension    Lymphedema    Multiple sclerosis ([place]) 2011   OSA (obstructive sleep apnea)    PONV (postoperative nausea and vomiting)    Scoliosis     Past Surgical History:  Procedure Laterality Date   BACK SURGERY     CHOLECYSTECTOMY     COLONOSCOPY N/A 04/23/2022   Procedure: COLONOSCOPY;  Surgeon: Toledo, Benay Pike, MD;  Location: [facility] ENDOSCOPY;  Service: Gastroenterology;  Laterality: N/A;   COLONOSCOPY WITH PROPOFOL N/A 05/18/2017   Procedure: COLONOSCOPY WITH PROPOFOL;  Surgeon: [name], MD;  Location: [facility] ENDOSCOPY;  Service: Endoscopy;  Laterality: N/A;   FOOT SURGERY  2015   GALLBLADDER SURGERY  2008   HARDWARE REMOVAL Left 02/14/2016   Procedure: LEFT FOOT REMOVAL DEEP IMPLANT;  Surgeon: [name], MD;  Location: [place];  Service: Orthopedics;  Laterality: Left;   HERNIA REPAIR     Inguinal Hernia Repair   SPINE SURGERY  2014    Family Psychiatric History: Please see initial evaluation for full details. I have reviewed the history. No updates at this time.     Family History:  Family History  Problem Relation Age of Onset   Arthritis Mother    Hypertension Mother    Macular degeneration Mother    Hypertension Father    Hyperlipidemia Father    Drug abuse Maternal Aunt    Heart disease Maternal Grandfather    Diabetes Maternal Grandfather     Kidney disease Paternal Grandmother     Social History:  Social History   Socioeconomic History   Marital status: Married    Spouse name: Engineer, technical sales   Number of children: 3   Years of education: 14   Highest education level: Associate degree: academic program  Occupational History   Not on file  Tobacco Use   Smoking status: Never   Smokeless tobacco: Never  Vaping Use   Vaping Use: Never used  Substance and Sexual Activity   Alcohol use: No    Alcohol/week: 0.0 standard drinks of alcohol   Drug use: No   Sexual activity: Not Currently  Other Topics Concern   Not on file  Social History Narrative   Lives with husband    Right handed   Caffeine: 1 cup of coffee in the AM, coke occa. Trying to come off caffeine    Social Determinants of Health   Financial Resource Strain: Low Risk  (01/07/2022)   Overall Financial Resource Strain (CARDIA)    Difficulty of Paying Living Expenses: Not hard at all  Food Insecurity: No Food Insecurity (01/07/2022)   Hunger Vital Sign    Worried About Running Out of Food in the Last Year: Never true    Ran Out of Food in the Last Year: Never true  Transportation Needs: No Transportation Needs (01/07/2022)   PRAPARE - Hydrologist (Medical): No    Lack of Transportation (Non-Medical): No  Physical Activity: Insufficiently Active (01/07/2022)   Exercise Vital Sign    Days of Exercise per Week: 2 days    Minutes of Exercise per Session: 60 min  Stress: No Stress Concern Present (01/07/2022)   [place]    Feeling of Stress : Not at all  Social Connections: Unknown (01/07/2022)   Social Connection and Isolation Panel [NHANES]    Frequency of Communication with Friends and Family: Not on file    Frequency of Social Gatherings with Friends and Family: Not on file    Attends Religious Services: Not on file    Active Member of Clubs or Organizations: Not on  file    Attends Archivist Meetings: Not on file    Marital Status: Married    Allergies:  Allergies  Allergen Reactions   Ibuprofen Swelling and Other (See Comments)   Sulfamethoxazole-Trimethoprim Itching   Penicillin G Rash   Asa [Aspirin] Swelling   Buspirone Other (See Comments)    Headaches     Levofloxacin Other (See Comments)    "weakness"   Lexapro [Escitalopram Oxalate] Other (See Comments)    Weakness    Pollen Extract Hives   Ceftin [Cefuroxime Axetil] Rash   Penicillins Rash   Sulfa Antibiotics Rash    Metabolic Disorder Labs: Lab Results  Component Value Date   HGBA1C 5.8 (H) 03/07/2022   MPG 116.89 12/09/2020   No results found for: "PROLACTIN" Lab Results  Component Value Date   CHOL 154 03/07/2022   TRIG 160 (H) 03/07/2022   HDL 69 03/07/2022   CHOLHDL 2.2 03/07/2022   VLDL 25.2 08/15/2021   LDLCALC 58 03/07/2022   LDLCALC 70 08/15/2021   Lab Results  Component Value Date   TSH 2.118 10/28/2022   TSH 2.050 03/07/2022    Therapeutic Level Labs: No results found for: "LITHIUM" No results found for: "VALPROATE" No results found for: "CBMZ"  Current Medications: Current Outpatient Medications  Medication Sig Dispense Refill   amLODipine (NORVASC) 5 MG tablet TAKE 1 TABLET BY MOUTH DAILY. 90 tablet 1   baclofen (LIORESAL) 10 MG tablet Takes 39m q 4 hours 180 each 5   buPROPion (WELLBUTRIN XL) 150 MG 24 hr tablet Take 1 tablet (150 mg total) by mouth daily. 30 tablet 1   busPIRone (BUSPAR) 15 MG tablet TAKE 1 TABLET BY MOUTH 3 TIMES DAILY 90 tablet 1   Cholecalciferol (D3-1000 PO) Take 2,000 Units by mouth daily.     clopidogrel (PLAVIX) 75 MG tablet TAKE 1 TABLET (75 MG) BY MOUTH EVERY DAY 90 tablet 1   Coenzyme Q10 (COQ10 PO) Take by mouth.     cyanocobalamin (,VITAMIN B-12,) 1000 MCG/ML injection Inject 1 mL (1,000 mcg total) into the muscle every 30 (thirty) days. 1 mL 3   dalfampridine 10 MG TB12 Take 1 tablet (10 mg total)  by mouth every 12 (twelve) hours. 180 tablet 3   docusate sodium (COLACE) 100 MG capsule Take 100 mg by mouth 2 (two) times daily.     fluticasone (FLONASE) 50 MCG/ACT nasal spray Place into the nose.     gabapentin (NEURONTIN) 100 MG capsule Take 2047min the morning, 20057mn the afternoon and 200m92m bedtime. 180 capsule 5   L-Methylfolate 15 MG TABS Take 1 tablet (15 mg  total) by mouth daily. 30 tablet 1   L-Methylfolate 15 MG TABS Take 1 tablet (15 mg total) by mouth daily. 90 tablet 0   L-Methylfolate-Algae (DEPLIN 15 PO) Take by mouth.     loratadine (CLARITIN) 10 MG tablet Take 10 mg by mouth daily.     LORazepam (ATIVAN) 0.5 MG tablet TAKE 1 TABLET BY MOUTH EVERY 8 HOURS AS DIRECTED 90 tablet 3   losartan (COZAAR) 50 MG tablet TAKE 1 TABLET BY MOUTH DAILY 90 tablet 1   MAGNESIUM GLYCINATE PO Take by mouth.     METAMUCIL FIBER PO Take by mouth 2 (two) times daily.     nystatin cream (MYCOSTATIN) Apply 1 application topically 2 (two) times daily. 30 g 11   ondansetron (ZOFRAN) 4 MG tablet Take 1 tablet (4 mg total) by mouth every 8 (eight) hours as needed for nausea or vomiting. 20 tablet 0   pantoprazole (PROTONIX) 40 MG tablet TWICE DAILY AT 7AM AND 5PM 62 tablet 1   predniSONE (DELTASONE) 10 MG tablet TAKE 3 TABLETS PO QD FOR 3 DAYS THEN TAKE 2 TABLETS PO QD FOR 3 DAYS THEN TAKE 1 TABLET PO QD FOR 3 DAYS THEN TAKE 1/2 TAB PO QD FOR 3 DAYS 20 tablet 0   rosuvastatin (CRESTOR) 5 MG tablet TAKE 1 TABLET BY MOUTH EVERY MONDAY, WEDNESDAY AND FRIDAY. 90 tablet 1   saccharomyces boulardii (FLORASTOR) 250 MG capsule Take 1 capsule (250 mg total) by mouth 2 (two) times daily. 60 capsule 0   sertraline (ZOLOFT) 100 MG tablet Take 150 mg by mouth daily.     sodium chloride (OCEAN) 0.65 % nasal spray Place 1-2 sprays into the nose as needed.     topiramate (TOPAMAX) 25 MG tablet Take 1 tablet (25 mg total) by mouth at bedtime. 90 tablet 3   Vitamin D, Ergocalciferol, (DRISDOL) 1.25 MG (50000  UNIT) CAPS capsule Take 1 capsule (50,000 Units total) by mouth every 7 (seven) days. 20 capsule 0   No current facility-administered medications for this visit.   Facility-Administered Medications Ordered in Other Visits  Medication Dose Route Frequency Provider Last Rate Last Admin   0.9 %  sodium chloride infusion   Intravenous Continuous Nolon Stalls C, MD 10 mL/hr at 04/23/22 1127 Continued from Pre-op at 04/23/22 1127   acetaminophen (TYLENOL) tablet 650 mg  650 mg Oral Once Lequita Asal, MD         Musculoskeletal: Strength & Muscle Tone: within normal limits Gait & Station: normal Patient leans: N/A  Psychiatric Specialty Exam: Review of Systems  There were no vitals taken for this visit.There is no height or weight on file to calculate BMI.  General Appearance: {Appearance:22683}  Eye Contact:  {BHH EYE CONTACT:22684}  Speech:  Clear and Coherent  Volume:  Normal  Mood:  {BHH MOOD:22306}  Affect:  {Affect (PAA):22687}  Thought Process:  Coherent  Orientation:  Full (Time, Place, and Person)  Thought Content: Logical   Suicidal Thoughts:  {ST/HT (PAA):22692}  Homicidal Thoughts:  {ST/HT (PAA):22692}  Memory:  Immediate;   Good  Judgement:  {Judgement (PAA):22694}  Insight:  {Insight (PAA):22695}  Psychomotor Activity:  Normal  Concentration:  Concentration: Good and Attention Span: Good  Recall:  Good  Fund of Knowledge: Good  Language: Good  Akathisia:  No  Handed:  Right  AIMS (if indicated): not done  Assets:  Communication Skills Desire for Improvement  ADL's:  Intact  Cognition: WNL  Sleep:  {BHH GOOD/FAIR/POOR:22877}  Screenings: GAD-7    Flowsheet Row Office Visit from 10/28/2022 in [place]  Total GAD-7 Score [place] Office Visit from 08/13/2022 in [facility] Clinical Support from 03/10/2017 in [place] at  [facility]  Total Score (max 30 points ) 21 30      PHQ2-9    Flowsheet Row Video Visit from 11/11/2022 in [place] at [facility] Visit from 10/28/2022 in [place] Office Visit from 05/22/2022 in [place] at [facility] Video Visit from 04/09/2022 in [place] at [place] from 01/07/2022 in [place] at [facility]  PHQ-2 Total Score 1 6 6 2 $ 0  PHQ-9 Total Score -- 18 22 3 $ --      [place] Office Visit from 10/28/2022 in [place] ED from 09/13/2022 in [facility] Emergency Department at [facility] Admission (Discharged) from 04/23/2022 in [place] No Risk No Risk No Risk        Assessment and Plan:  Carly [name] is a 72 y.o. year old female with a history of depression, anxiety, multiple sclerosis, CVA, hypertension, OSA (on CPAP), obesity, low back pain, who is referred for depression.    1. Major depressive disorder, recurrent episode, moderate ([place]) Exam is notable for restricted affect, and she reports significant worsening in depressive symptoms with fatigue over the past several months.  Psychosocial stressors includes demoralization due to MS/CVA, and she reports loss of her parents several years ago.  She reports good nurturing relationship with her parents growing up.  She has good support from her husband at home.  Will start bupropion to target depression.  She has known history of seizure.  Discussed potential risk of headache, insomnia.  Will continue sertraline, metrifonate to target depression at this time.  Will continue BuSpar for her anxiety.  Will plan to taper off some of her medication to avoid polypharmacy in the future.  Will obtain labs to rule out medical health issues  contributing to her symptoms.    2. Cognitive decline She reports worsening in attention, and has word-finding difficulty.  She is at risk of cognitive decline due to MS and stroke.  Will obtain labs to rule out medical health issues contributing to this.    Plan Start bupropion 150 mg daily  Continue sertraline 150 mg daily  Continue L methylfolate 15 mg daily  Continue Buspirone 15 mg twice day Obtain labs (TSH, vitamin B12, folate) Next appointment: 2/27 at 1:30, IP  -on Gabapentin 200 mg, twice a day, baclofen - on Lorazepam 0.5 mg three times a day as needed (takes it twice a day)     The patient demonstrates the following risk factors for suicide: Chronic risk factors for suicide include: psychiatric disorder of depression, medical illness of MS, and chronic pain. Acute risk factors for suicide include: unemployment and loss (financial, interpersonal, professional). Protective factors for this patient include: positive social support and hope for the future. Considering these factors, the overall suicide risk at this point appears to be low. Patient is appropriate for outpatient follow up.   Collaboration of Care: Collaboration of Care: {BH OP Collaboration of Care:21014065}  Patient/Guardian was advised Release of Information must be obtained prior to any record release in  order to collaborate their care with an outside provider. Patient/Guardian was advised if they have not already done so to contact the registration department to sign all necessary forms in order for [place] to release information regarding their care.   Consent: Patient/Guardian gives verbal consent for treatment and assignment of benefits for services provided during this visit. Patient/Guardian expressed understanding and agreed to proceed.    Norman Clay, MD 12/04/2022, 5:51 PM

## 2022-12-09 ENCOUNTER — Ambulatory Visit: Payer: Medicare Other | Admitting: Psychiatry

## 2022-12-09 ENCOUNTER — Ambulatory Visit: Payer: Medicare Other | Admitting: Neurology

## 2022-12-11 ENCOUNTER — Telehealth: Payer: Self-pay | Admitting: Internal Medicine

## 2022-12-11 NOTE — Telephone Encounter (Signed)
Patient has been exposed to [place] and is having symptoms; coughing (very deep cough), body aches, runny nose and nose congestion. Symptoms since last Friday, 2/24, 2 negative COVID tests

## 2022-12-11 NOTE — Telephone Encounter (Signed)
Confirmed no chest tightness, sob, etc. Patient has been scheduled for acute appt tomorrow with NP. Will be evaluated more acutely if symptoms change or worsen

## 2022-12-12 ENCOUNTER — Telehealth: Payer: Self-pay

## 2022-12-12 ENCOUNTER — Ambulatory Visit (INDEPENDENT_AMBULATORY_CARE_PROVIDER_SITE_OTHER): Payer: Medicare Other | Admitting: Clinical

## 2022-12-12 ENCOUNTER — Encounter: Payer: Self-pay | Admitting: Nurse Practitioner

## 2022-12-12 ENCOUNTER — Ambulatory Visit (INDEPENDENT_AMBULATORY_CARE_PROVIDER_SITE_OTHER): Payer: Medicare Other | Admitting: Nurse Practitioner

## 2022-12-12 VITALS — BP 138/82 | HR 86 | Temp 98.0°F | Ht 63.0 in

## 2022-12-12 DIAGNOSIS — J029 Acute pharyngitis, unspecified: Secondary | ICD-10-CM

## 2022-12-12 DIAGNOSIS — R52 Pain, unspecified: Secondary | ICD-10-CM | POA: Diagnosis not present

## 2022-12-12 DIAGNOSIS — F331 Major depressive disorder, recurrent, moderate: Secondary | ICD-10-CM | POA: Diagnosis not present

## 2022-12-12 DIAGNOSIS — R058 Other specified cough: Secondary | ICD-10-CM

## 2022-12-12 DIAGNOSIS — F419 Anxiety disorder, unspecified: Secondary | ICD-10-CM

## 2022-12-12 LAB — POCT RAPID STREP A (OFFICE): Rapid Strep A Screen: NEGATIVE

## 2022-12-12 LAB — POCT INFLUENZA A/B
Influenza A, POC: NEGATIVE
Influenza B, POC: NEGATIVE

## 2022-12-12 LAB — POC COVID19 BINAXNOW: SARS Coronavirus 2 Ag: NEGATIVE

## 2022-12-12 MED ORDER — HYDROCOD POLI-CHLORPHE POLI ER 10-8 MG/5ML PO SUER: 5.0000 mL | Freq: Every evening | ORAL | 0 refills | Status: DC | PRN

## 2022-12-12 NOTE — Progress Notes (Signed)
Established Patient Visit  Subjective:  Patient ID: Carly [name], female    DOB: [birth year]  Age: 72 y.o. MRN: [id number]  CC:  Chief Complaint  Patient presents with   Cough    Cough, congestion,sore throat, body aches, slurring words, leaning to the left x 1 week    HPI  Carly [name] presents for cough, congestion, sore throat and body aches from 1 week.  Patient caregiver states that she has been exposed to [place] by a healthcare provider and wanted to get her tested for COVID, strep and flu. She is wheelchair-bound.  Has history of MS, scoliosis, migraine, hypertension, depression and anxiety. The caregiver states that the cough is worse during nighttime.    HPI   Past Medical History:  Diagnosis Date   Allergy    Anxiety    Aspiration pneumonia (HCC)    Depression    Frequent headaches    H/O   GERD (gastroesophageal reflux disease)    History of chicken pox    History of colon polyps    Hx of migraines    Hypertension    Lymphedema    Multiple sclerosis ([place]) 2011   OSA (obstructive sleep apnea)    PONV (postoperative nausea and vomiting)    Scoliosis     Past Surgical History:  Procedure Laterality Date   BACK SURGERY     CHOLECYSTECTOMY     COLONOSCOPY N/A 04/23/2022   Procedure: COLONOSCOPY;  Surgeon: Toledo, Benay Pike, MD;  Location: [facility] ENDOSCOPY;  Service: Gastroenterology;  Laterality: N/A;   COLONOSCOPY WITH PROPOFOL N/A 05/18/2017   Procedure: COLONOSCOPY WITH PROPOFOL;  Surgeon: [name], MD;  Location: [facility] ENDOSCOPY;  Service: Endoscopy;  Laterality: N/A;   FOOT SURGERY  2015   GALLBLADDER SURGERY  2008   HARDWARE REMOVAL Left 02/14/2016   Procedure: LEFT FOOT REMOVAL DEEP IMPLANT;  Surgeon: [name], MD;  Location: [facility];  Service: Orthopedics;  Laterality: Left;   HERNIA REPAIR     Inguinal Hernia Repair   SPINE SURGERY  2014    Family History  Problem Relation Age of Onset   Arthritis Mother     Hypertension Mother    Macular degeneration Mother    Hypertension Father    Hyperlipidemia Father    Drug abuse Maternal Aunt    Heart disease Maternal Grandfather    Diabetes Maternal Grandfather    Kidney disease Paternal Grandmother     Social History   Socioeconomic History   Marital status: Married    Spouse name: Engineer, technical sales   Number of children: 3   Years of education: 14   Highest education level: Associate degree: academic program  Occupational History   Not on file  Tobacco Use   Smoking status: Never   Smokeless tobacco: Never  Vaping Use   Vaping Use: Never used  Substance and Sexual Activity   Alcohol use: No    Alcohol/week: 0.0 standard drinks of alcohol   Drug use: No   Sexual activity: Not Currently  Other Topics Concern   Not on file  Social History Narrative   Lives with husband    Right handed   Caffeine: 1 cup of coffee in the AM, coke occa. Trying to come off caffeine    Social Determinants of Health   Financial Resource Strain: Low Risk  (01/07/2022)   Overall Financial Resource Strain (CARDIA)    Difficulty of Paying Living Expenses: Not hard at all  Food Insecurity:  No Food Insecurity (12/15/2022)   Hunger Vital Sign    Worried About Running Out of Food in the Last Year: Never true    Ran Out of Food in the Last Year: Never true  Transportation Needs: No Transportation Needs (12/15/2022)   PRAPARE - Hydrologist (Medical): No    Lack of Transportation (Non-Medical): No  Physical Activity: Insufficiently Active (01/07/2022)   Exercise Vital Sign    Days of Exercise per Week: 2 days    Minutes of Exercise per Session: 60 min  Stress: No Stress Concern Present (01/07/2022)   [place]    Feeling of Stress : Not at all  Social Connections: Unknown (01/07/2022)   Social Connection and Isolation Panel [NHANES]    Frequency of Communication with Friends and  Family: Not on file    Frequency of Social Gatherings with Friends and Family: Not on file    Attends Religious Services: Not on file    Active Member of Clubs or Organizations: Not on file    Attends Archivist Meetings: Not on file    Marital Status: Married  Intimate Partner Violence: Not At Risk (12/15/2022)   Humiliation, Afraid, Rape, and Kick questionnaire    Fear of Current or Ex-Partner: No    Emotionally Abused: No    Physically Abused: No    Sexually Abused: No     Facility-Administered Medications Prior to Visit  Medication Dose Route Frequency Provider Last Rate Last Admin   0.9 %  sodium chloride infusion   Intravenous Continuous Lequita Asal, MD 10 mL/hr at 04/23/22 1127 Continued from Pre-op at 04/23/22 1127   acetaminophen (TYLENOL) tablet 650 mg  650 mg Oral Once Lequita Asal, MD       Outpatient Medications Prior to Visit  Medication Sig Dispense Refill   amLODipine (NORVASC) 5 MG tablet TAKE 1 TABLET BY MOUTH DAILY. 90 tablet 1   baclofen (LIORESAL) 10 MG tablet Takes '10mg'$  q 4 hours 180 each 5   buPROPion (WELLBUTRIN XL) 150 MG 24 hr tablet Take 1 tablet (150 mg total) by mouth daily. 30 tablet 1   busPIRone (BUSPAR) 15 MG tablet TAKE 1 TABLET BY MOUTH 3 TIMES DAILY 90 tablet 1   Cholecalciferol (D3-1000 PO) Take 2,000 Units by mouth daily.     clopidogrel (PLAVIX) 75 MG tablet TAKE 1 TABLET (75 MG) BY MOUTH EVERY DAY 90 tablet 1   Coenzyme Q10 (COQ10 PO) Take by mouth.     cyanocobalamin (,VITAMIN B-12,) 1000 MCG/ML injection Inject 1 mL (1,000 mcg total) into the muscle every 30 (thirty) days. 1 mL 3   dalfampridine 10 MG TB12 Take 1 tablet (10 mg total) by mouth every 12 (twelve) hours. 180 tablet 3   docusate sodium (COLACE) 100 MG capsule Take 100 mg by mouth 2 (two) times daily.     fluticasone (FLONASE) 50 MCG/ACT nasal spray Place 1 spray into both nostrils daily.     gabapentin (NEURONTIN) 100 MG capsule Take '200mg'$  in the morning,  '200mg'$  in the afternoon and '200mg'$  at bedtime. 180 capsule 5   L-Methylfolate 15 MG TABS Take 1 tablet (15 mg total) by mouth daily. 30 tablet 1   L-Methylfolate-Algae (DEPLIN 15 PO) Take 1 tablet by mouth daily.     loratadine (CLARITIN) 10 MG tablet Take 10 mg by mouth daily.     LORazepam (ATIVAN) 0.5 MG tablet TAKE 1 TABLET  BY MOUTH EVERY 8 HOURS AS DIRECTED 90 tablet 3   losartan (COZAAR) 50 MG tablet TAKE 1 TABLET BY MOUTH DAILY 90 tablet 1   MAGNESIUM GLYCINATE PO Take by mouth.     METAMUCIL FIBER PO Take 400 mg by mouth every evening.     nystatin cream (MYCOSTATIN) Apply 1 application topically 2 (two) times daily. 30 g 11   ondansetron (ZOFRAN) 4 MG tablet Take 1 tablet (4 mg total) by mouth every 8 (eight) hours as needed for nausea or vomiting. 20 tablet 0   pantoprazole (PROTONIX) 40 MG tablet TWICE DAILY AT 7AM AND 5PM 62 tablet 1   predniSONE (DELTASONE) 10 MG tablet TAKE 3 TABLETS PO QD FOR 3 DAYS THEN TAKE 2 TABLETS PO QD FOR 3 DAYS THEN TAKE 1 TABLET PO QD FOR 3 DAYS THEN TAKE 1/2 TAB PO QD FOR 3 DAYS 20 tablet 0   rosuvastatin (CRESTOR) 5 MG tablet TAKE 1 TABLET BY MOUTH EVERY MONDAY, WEDNESDAY AND FRIDAY. 90 tablet 1   saccharomyces boulardii (FLORASTOR) 250 MG capsule Take 1 capsule (250 mg total) by mouth 2 (two) times daily. 60 capsule 0   sertraline (ZOLOFT) 100 MG tablet Take 150 mg by mouth daily.     sodium chloride (OCEAN) 0.65 % nasal spray Place 1-2 sprays into the nose as needed.     topiramate (TOPAMAX) 25 MG tablet Take 1 tablet (25 mg total) by mouth at bedtime. 90 tablet 3   Vitamin D, Ergocalciferol, (DRISDOL) 1.25 MG (50000 UNIT) CAPS capsule Take 1 capsule (50,000 Units total) by mouth every 7 (seven) days. 20 capsule 0   L-Methylfolate 15 MG TABS Take 1 tablet (15 mg total) by mouth daily. 90 tablet 0    Allergies  Allergen Reactions   Ibuprofen Swelling and Other (See Comments)   Sulfamethoxazole-Trimethoprim Itching   Penicillin G Rash   Asa [Aspirin]  Swelling   Buspirone Other (See Comments)    Headaches     Levofloxacin Other (See Comments)    "weakness"   Lexapro [Escitalopram Oxalate] Other (See Comments)    Weakness    Pollen Extract Hives   Ceftin [Cefuroxime Axetil] Rash   Penicillins Rash   Sulfa Antibiotics Rash    ROS Review of Systems  Constitutional:  Positive for fatigue.  HENT:  Positive for congestion and sore throat.   Eyes: Negative.   Respiratory:  Positive for cough.   Cardiovascular: Negative.   Gastrointestinal: Negative.   Genitourinary: Negative.   Neurological: Negative.       Objective:    Physical Exam Constitutional:      Appearance: Normal appearance.  HENT:     Right Ear: Tympanic membrane normal.     Left Ear: Tympanic membrane normal.     Nose: Nose normal.     Mouth/Throat:     Mouth: Mucous membranes are moist.     Pharynx: Oropharynx is clear.  Eyes:     Pupils: Pupils are equal, round, and reactive to light.  Cardiovascular:     Rate and Rhythm: Normal rate and regular rhythm.     Pulses: Normal pulses.     Heart sounds: Normal heart sounds.  Pulmonary:     Effort: Pulmonary effort is normal.     Breath sounds: Normal breath sounds. No stridor. No wheezing.  Skin:    General: Skin is warm.  Neurological:     General: No focal deficit present.     Mental Status: She is alert and  oriented to person, place, and time. Mental status is at baseline.  Psychiatric:        Mood and Affect: Mood normal.        Behavior: Behavior normal.        Thought Content: Thought content normal.        Judgment: Judgment normal.     BP 138/82   Pulse 86   Temp 98 F (36.7 C)   Ht '5\' 3"'$  (1.6 m)   SpO2 99%   BMI 33.66 kg/m  Wt Readings from Last 3 Encounters:  12/15/22 193 lb (87.5 kg)  11/11/22 190 lb (86.2 kg)  10/24/22 189 lb 12.8 oz (86.1 kg)     Health Maintenance  Topic Date Due   FOOT EXAM  Never done   Diabetic kidney evaluation - Urine ACR  Never done   COVID-19  Vaccine (7 - 2023-24 season) 06/13/2022   HEMOGLOBIN A1C  09/07/2022   OPHTHALMOLOGY EXAM  12/12/2022   Pneumonia Vaccine 107+ Years old (3 of 3 - PPSV23 or PCV20) 03/15/2023 (Originally 12/15/2021)   MAMMOGRAM  08/17/2023   Diabetic kidney evaluation - eGFR measurement  12/16/2023   COLONOSCOPY (Pts 45-65yr Insurance coverage will need to be confirmed)  04/23/2032   INFLUENZA VACCINE  Completed   DEXA SCAN  Completed   Hepatitis C Screening  Completed   Zoster Vaccines- Shingrix  Completed   HPV VACCINES  Aged Out   DTaP/Tdap/Td  Discontinued    There are no preventive care reminders to display for this patient.  Lab Results  Component Value Date   TSH 2.118 10/28/2022   Lab Results  Component Value Date   WBC 6.9 12/15/2022   HGB 13.4 12/15/2022   HCT 42.1 12/15/2022   MCV 81.9 12/15/2022   PLT 269 12/15/2022   Lab Results  Component Value Date   NA 135 12/16/2022   K 3.6 12/16/2022   CO2 23 12/16/2022   GLUCOSE 130 (H) 12/16/2022   BUN 14 12/16/2022   CREATININE 0.67 12/16/2022   BILITOT 0.5 12/14/2022   ALKPHOS 74 12/14/2022   AST 25 12/14/2022   ALT 20 12/14/2022   PROT 7.1 12/14/2022   ALBUMIN 4.1 12/14/2022   CALCIUM 9.4 12/16/2022   ANIONGAP 9 12/16/2022   EGFR 93 11/12/2021   GFR 88.14 08/15/2021   Lab Results  Component Value Date   CHOL 154 03/07/2022   Lab Results  Component Value Date   HDL 69 03/07/2022   Lab Results  Component Value Date   LDLCALC 58 03/07/2022   Lab Results  Component Value Date   TRIG 160 (H) 03/07/2022   Lab Results  Component Value Date   CHOLHDL 2.2 03/07/2022   Lab Results  Component Value Date   HGBA1C 5.8 (H) 03/07/2022      Assessment & Plan:  Other cough Assessment & Plan: POCT strep, COVID, flu negative. Started her on Tussionex 5 mL at bedtime. Advised her to increase hydration and use humidifier.  Orders: -     POC COVID-19 BinaxNow -     POCT Influenza A/B -     POCT rapid strep A  Body  aches -     POC COVID-19 BinaxNow -     POCT Influenza A/B -     POCT rapid strep A  Sore throat -     POC COVID-19 BinaxNow -     POCT Influenza A/B -     POCT rapid strep A  Other  orders -     Hydrocod Poli-Chlorphe Poli ER; Take 5 mLs by mouth at bedtime as needed for cough.  Dispense: 70 mL; Refill: 0    Follow-up: Return if symptoms worsen or fail to improve.   Theresia Lo, NP

## 2022-12-12 NOTE — Patient Instructions (Signed)
COVID, Flu and Strep negative Tussionex sent to pharmacy. Use warm water and salt gargles. Increase hydration.

## 2022-12-12 NOTE — Progress Notes (Signed)
[facility] Counselor/Therapist Progress Note  Patient ID: Carly [name], MRN: [id number]    Date: 12/12/22  Time Spent: 9:31  am - 9:48 am : 17 Minutes  Treatment Type: Individual Therapy.  Reported Symptoms: Patient reported she is currently experiencing neck pain.   Mental Status Exam: Appearance:  Could not assess      Behavior: Could not assess  Motor: Could not assess  Speech/Language:  Clear and Coherent  Affect: Could not assess  Mood: normal  Thought process: normal  Thought content:   WNL  Sensory/Perceptual disturbances:   WNL  Orientation: oriented to person and place  Attention: Good  Concentration: Good  Memory: WNL  Fund of knowledge:  Good  Insight:   Good  Judgment:  Good  Impulse Control: Good   Risk Assessment: Danger to Self:  No Patient denied current suicidal ideation  Self-injurious Behavior: No Danger to Others: No Patient denied current homicidal ideation  Duty to Warn:no Physical Aggression / Violence:No  Access to Firearms a concern: No  Gang Involvement:No   Subjective:  Patient initially requested to reschedule for a later appointment due to neck pain but then agreed to a shorter session today. Patient and patient's husband reported she has been experiencing neck pain. Patient reported she has been experiencing neck pain for six weeks.  Patient stated, "its a terrible pain". Patient reported she has an appointment with her primary care physician today and plans to call to schedule an appointment for the neck pain. Patient stated, "my mood has been really good". Patient reported she was able to obtain her medication since last session.    Interventions: Clinician conducted session via WebEx audio from clinician's home office due to video component of patient's WebEx not working. Patient provided verbal consent to proceed with telehealth session and participated in session from patient's home. Patient provided verbal consent for  caregiver and husband to be present during session. Discussed patient's concerns related to current neck pain. Assessed patient's mood. Assessed for suicidal and homicidal ideation. Discussed patient's previous concern related to being out of medication and the status of the situation.   Diagnosis:  Major depressive disorder, recurrent episode, moderate (HCC)   Anxiety disorder, unspecified type     Plan: Goals to be finalized during follow up appointment on 01/02/23 due to patient experiencing pain today.                  Katherina Right, LCSW

## 2022-12-12 NOTE — Telephone Encounter (Signed)
Shirlean Mylar called from [facility] to state he received a prescription from Theresia Lo, NP, for  chlorpheniramine-HYDROcodone ([place]) 10-8 MG/5ML for patient.  Shirlean Mylar states this is a C-2 narcotic, and therefore cannot be filled via faxed prescription.    Robin asked that we please mail the hard copy of the prescription to him at:   [facility]  [address], [place]  [address]

## 2022-12-12 NOTE — Telephone Encounter (Signed)
mailed

## 2022-12-14 ENCOUNTER — Inpatient Hospital Stay
Admission: EM | Admit: 2022-12-14 | Discharge: 2022-12-18 | DRG: 059 | Disposition: A | Discharge status: Home Health | Payer: Medicare Other | Attending: Osteopathic Medicine | Admitting: Osteopathic Medicine

## 2022-12-14 ENCOUNTER — Emergency Department: Payer: Medicare Other

## 2022-12-14 ENCOUNTER — Other Ambulatory Visit: Payer: Self-pay

## 2022-12-14 DIAGNOSIS — Z993 Dependence on wheelchair: Secondary | ICD-10-CM | POA: Diagnosis not present

## 2022-12-14 DIAGNOSIS — M7989 Other specified soft tissue disorders: Secondary | ICD-10-CM | POA: Insufficient documentation

## 2022-12-14 DIAGNOSIS — E7800 Pure hypercholesterolemia, unspecified: Secondary | ICD-10-CM | POA: Diagnosis present

## 2022-12-14 DIAGNOSIS — I890 Lymphedema, not elsewhere classified: Secondary | ICD-10-CM

## 2022-12-14 DIAGNOSIS — E781 Pure hyperglyceridemia: Secondary | ICD-10-CM | POA: Diagnosis present

## 2022-12-14 DIAGNOSIS — Z79899 Other long term (current) drug therapy: Secondary | ICD-10-CM

## 2022-12-14 DIAGNOSIS — I639 Cerebral infarction, unspecified: Secondary | ICD-10-CM | POA: Diagnosis present

## 2022-12-14 DIAGNOSIS — Z8249 Family history of ischemic heart disease and other diseases of the circulatory system: Secondary | ICD-10-CM | POA: Diagnosis not present

## 2022-12-14 DIAGNOSIS — Z7902 Long term (current) use of antithrombotics/antiplatelets: Secondary | ICD-10-CM

## 2022-12-14 DIAGNOSIS — Z1152 Encounter for screening for COVID-19: Secondary | ICD-10-CM | POA: Diagnosis not present

## 2022-12-14 DIAGNOSIS — F339 Major depressive disorder, recurrent, unspecified: Secondary | ICD-10-CM | POA: Diagnosis present

## 2022-12-14 DIAGNOSIS — R4781 Slurred speech: Secondary | ICD-10-CM | POA: Diagnosis not present

## 2022-12-14 DIAGNOSIS — M4802 Spinal stenosis, cervical region: Secondary | ICD-10-CM | POA: Diagnosis not present

## 2022-12-14 DIAGNOSIS — I6389 Other cerebral infarction: Secondary | ICD-10-CM | POA: Diagnosis not present

## 2022-12-14 DIAGNOSIS — J069 Acute upper respiratory infection, unspecified: Secondary | ICD-10-CM | POA: Diagnosis present

## 2022-12-14 DIAGNOSIS — Z888 Allergy status to other drugs, medicaments and biological substances status: Secondary | ICD-10-CM

## 2022-12-14 DIAGNOSIS — I10 Essential (primary) hypertension: Secondary | ICD-10-CM | POA: Diagnosis present

## 2022-12-14 DIAGNOSIS — R531 Weakness: Secondary | ICD-10-CM | POA: Diagnosis not present

## 2022-12-14 DIAGNOSIS — Z881 Allergy status to other antibiotic agents status: Secondary | ICD-10-CM

## 2022-12-14 DIAGNOSIS — M5137 Other intervertebral disc degeneration, lumbosacral region: Secondary | ICD-10-CM | POA: Diagnosis present

## 2022-12-14 DIAGNOSIS — Z66 Do not resuscitate: Secondary | ICD-10-CM | POA: Diagnosis present

## 2022-12-14 DIAGNOSIS — F32A Depression, unspecified: Secondary | ICD-10-CM | POA: Diagnosis present

## 2022-12-14 DIAGNOSIS — I69354 Hemiplegia and hemiparesis following cerebral infarction affecting left non-dominant side: Secondary | ICD-10-CM

## 2022-12-14 DIAGNOSIS — R609 Edema, unspecified: Secondary | ICD-10-CM | POA: Diagnosis not present

## 2022-12-14 DIAGNOSIS — Z882 Allergy status to sulfonamides status: Secondary | ICD-10-CM

## 2022-12-14 DIAGNOSIS — Z7401 Bed confinement status: Secondary | ICD-10-CM

## 2022-12-14 DIAGNOSIS — F482 Pseudobulbar affect: Secondary | ICD-10-CM | POA: Diagnosis present

## 2022-12-14 DIAGNOSIS — Z880 Allergy status to penicillin: Secondary | ICD-10-CM

## 2022-12-14 DIAGNOSIS — K219 Gastro-esophageal reflux disease without esophagitis: Secondary | ICD-10-CM | POA: Diagnosis present

## 2022-12-14 DIAGNOSIS — Z83438 Family history of other disorder of lipoprotein metabolism and other lipidemia: Secondary | ICD-10-CM

## 2022-12-14 DIAGNOSIS — G35 Multiple sclerosis: Principal | ICD-10-CM | POA: Diagnosis present

## 2022-12-14 DIAGNOSIS — Z886 Allergy status to analgesic agent status: Secondary | ICD-10-CM

## 2022-12-14 DIAGNOSIS — Z8261 Family history of arthritis: Secondary | ICD-10-CM | POA: Diagnosis not present

## 2022-12-14 DIAGNOSIS — Z833 Family history of diabetes mellitus: Secondary | ICD-10-CM

## 2022-12-14 DIAGNOSIS — M47812 Spondylosis without myelopathy or radiculopathy, cervical region: Secondary | ICD-10-CM | POA: Diagnosis not present

## 2022-12-14 DIAGNOSIS — G4733 Obstructive sleep apnea (adult) (pediatric): Secondary | ICD-10-CM

## 2022-12-14 DIAGNOSIS — R519 Headache, unspecified: Secondary | ICD-10-CM | POA: Diagnosis present

## 2022-12-14 DIAGNOSIS — M542 Cervicalgia: Secondary | ICD-10-CM | POA: Diagnosis not present

## 2022-12-14 DIAGNOSIS — Z8673 Personal history of transient ischemic attack (TIA), and cerebral infarction without residual deficits: Secondary | ICD-10-CM

## 2022-12-14 DIAGNOSIS — W19XXXA Unspecified fall, initial encounter: Secondary | ICD-10-CM | POA: Diagnosis not present

## 2022-12-14 DIAGNOSIS — G319 Degenerative disease of nervous system, unspecified: Secondary | ICD-10-CM | POA: Diagnosis not present

## 2022-12-14 DIAGNOSIS — J988 Other specified respiratory disorders: Secondary | ICD-10-CM | POA: Diagnosis present

## 2022-12-14 DIAGNOSIS — I6623 Occlusion and stenosis of bilateral posterior cerebral arteries: Secondary | ICD-10-CM | POA: Diagnosis not present

## 2022-12-14 DIAGNOSIS — M51379 Other intervertebral disc degeneration, lumbosacral region without mention of lumbar back pain or lower extremity pain: Secondary | ICD-10-CM | POA: Diagnosis present

## 2022-12-14 LAB — RESP PANEL BY RT-PCR (RSV, FLU A&B, COVID)  RVPGX2
Influenza A by PCR: NEGATIVE
Influenza B by PCR: NEGATIVE
Resp Syncytial Virus by PCR: NEGATIVE
SARS Coronavirus 2 by RT PCR: NEGATIVE

## 2022-12-14 LAB — COMPREHENSIVE METABOLIC PANEL
ALT: 20 U/L (ref 0–44)
AST: 25 U/L (ref 15–41)
Albumin: 4.1 g/dL (ref 3.5–5.0)
Alkaline Phosphatase: 74 U/L (ref 38–126)
Anion gap: 11 (ref 5–15)
BUN: 14 mg/dL (ref 8–23)
CO2: 25 mmol/L (ref 22–32)
Calcium: 9.2 mg/dL (ref 8.9–10.3)
Chloride: 102 mmol/L (ref 98–111)
Creatinine, Ser: 0.8 mg/dL (ref 0.44–1.00)
GFR, Estimated: >60 mL/min (ref >60)
Glucose, Bld: 102 mg/dL — ABNORMAL HIGH (ref 70–99)
Potassium: 3.4 mmol/L — ABNORMAL LOW (ref 3.5–5.1)
Sodium: 138 mmol/L (ref 135–145)
Total Bilirubin: 0.5 mg/dL (ref 0.3–1.2)
Total Protein: 7.1 g/dL (ref 6.5–8.1)

## 2022-12-14 LAB — CBC
HCT: 42.2 % (ref 36.0–46.0)
Hemoglobin: 13.5 g/dL (ref 12.0–15.0)
MCH: 25.9 pg — ABNORMAL LOW (ref 26.0–34.0)
MCHC: 32 g/dL (ref 30.0–36.0)
MCV: 80.8 fL (ref 80.0–100.0)
Platelets: 290 10*3/uL (ref 150–400)
RBC: 5.22 MIL/uL — ABNORMAL HIGH (ref 3.87–5.11)
RDW: 16.2 % — ABNORMAL HIGH (ref 11.5–15.5)
WBC: 7.5 10*3/uL (ref 4.0–10.5)
nRBC: 0 % (ref 0.0–0.2)

## 2022-12-14 LAB — TROPONIN I (HIGH SENSITIVITY): Troponin I (High Sensitivity): 3 ng/L (ref <18)

## 2022-12-14 MED ORDER — GADOBUTROL 1 MMOL/ML IV SOLN
9.0000 mL | Freq: Once | INTRAVENOUS | Status: AC | PRN
  Administered 2022-12-14: 9 mL via INTRAVENOUS

## 2022-12-14 NOTE — ED Triage Notes (Signed)
Pt to ED via [facility] for c/o left-sided weakness that began weeks ago. Right-sided weakness also noted which began months ago. Pt c/o neck pain, left arm swelling x 2 weeks, right arm swelling x 1 month. 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 Pt accompanied by home health aide.

## 2022-12-14 NOTE — ED Notes (Signed)
MD in room speaking with pt and visitors. Decision made by pt and visitors to stay and wait for neurology consult.

## 2022-12-14 NOTE — ED Provider Notes (Addendum)
[facility] Provider Note    Event Date/Time   First MD Initiated Contact with Patient 12/14/22 1645     (approximate)  History   Chief Complaint: Weakness and Arm Swelling  HPI  Carly [name] is a 72 y.o. female with a past medical history of multiple sclerosis, hypertension, depression, anxiety, presents to the emergency department with multiple complaints.  According to the patient family patient is bedbound for the most part today we will place patient in a wheelchair for transport.  They have noted the patient has increased swelling mostly in the right upper extremity.  Patient has a history of lymphedema in the lower extremities but states on occasion will have swelling in the upper extremities.  They have also noted some increased weakness in the bilateral upper extremities as well as some slurred speech over the past 2 weeks or so.  Patient has been complaining of increased neck pain however the patient's caregiver states this is likely from her staying with her neck bent forward most of the day.  They also state for the last 3 weeks she has had upper respiratory congestion as well.  Physical Exam   Triage Vital Signs: ED Triage Vitals  Enc Vitals Group     BP 12/14/22 1615 109/69     Pulse Rate 12/14/22 1615 82     Resp 12/14/22 1615 11     Temp 12/14/22 1618 98.4 F (36.9 C)     Temp Source 12/14/22 1618 Oral     SpO2 12/14/22 1613 98 %     Weight --      Height 12/14/22 1619 '5\' 3"'$  (1.6 m)     Head Circumference --      Peak Flow --      Pain Score 12/14/22 1619 0     Pain Loc --      Pain Edu? --      Excl. in [place]? --     Most recent vital signs: Vitals:   12/14/22 1618 12/14/22 1630  BP: 109/69 113/69  Pulse: 84 82  Resp: 16 15  Temp: 98.4 F (36.9 C)   SpO2: 96% 96%    General: Awake, no distress.  CV:  Good peripheral perfusion.  Regular rate and rhythm  Resp:  Normal effort.  Equal breath sounds bilaterally.  Abd:  No  distention.  Soft, nontender.  No rebound or guarding. Other:  Minimal lower extremity IMA bilaterally.  Patient does have 1+ right upper extremity edema no appreciable left upper extremity edema.  2+ radial pulses.   ED Results / Procedures / Treatments   EKG  EKG viewed and interpreted by myself shows a normal sinus rhythm at 86 bpm with a narrow QRS, left axis deviation, largely normal intervals with nonspecific ST changes.  RADIOLOGY  Ultrasound is negative for DVT. 1. 1.2 cm focus of restricted diffusion involving the right thalamocapsular region with irregular nodular postcontrast enhancement. Given the somewhat irregular enhancement pattern, finding favored to reflect a small focus of active demyelination. Possible evolving subacute ischemic infarct is difficult to exclude, and could be considered in the correct clinical setting. 2. Few additional subtle nodular foci of enhancement at the left frontal corona radiata and central pons, likely additional small foci of active demyelination. 3. Underlying severe cerebral white matter disease, presumably related to history of multiple sclerosis, although a degree of chronic microvascular ischemic disease may be contributory.   1. Patchy multifocal signal abnormality involving the cervical spinal  cord as above, consistent with history of demyelinating disease/multiple sclerosis. No evidence for active demyelination. 2. Multilevel cervical spondylosis with resultant mild to moderate diffuse spinal stenosis at C4-5 through C6-7. Associated mild to moderate right C4 and left C6 foraminal narrowing, with moderate bilateral C7 foraminal stenosis.   MEDICATIONS ORDERED IN ED: Medications - No data to display   IMPRESSION / MDM / [place] / ED COURSE  I reviewed the triage vital signs and the nursing notes.  Patient's presentation is most consistent with acute presentation with potential threat to life or bodily  function.  Patient presents emergency department with complaints of increased swelling in the right upper extremity as well as slurred speech x 2 weeks and some increased weakness in bilateral upper extremities although they state at baseline patient has minimal use of her right upper extremity as well as lower extremities.  Given the complaints of increased weakness as well as recent congestion we will obtain a COVID/flu/RSV test.  Will also obtain a urinalysis to rule out urinary tract infection.  Given the patient's history of MS now with increased weakness and neck pain as well as slurred speech will obtain MRI imaging with and without contrast of the brain and C-spine.  Will check basic labs to evaluate for metabolic or electrolyte abnormalities, continue to closely monitor.  Patient agreeable to plan of care.  With a right upper extremity swelling we will obtain an ultrasound to rule out DVT.  Patient's ultrasound negative for DVT.  The MRI does show multi focal signal abnormalities consistent with demyelinating disease no active demyelination in the C-spine however there is an area in the brain could be active demyelination or subacute infarct.  I discussed this with the patient attempted to call neurology but have not yet heard back.  The patient states she wants to go home she has an [place] doctor that she is following up with tomorrow will bring the MRI report to that doctor.  States regardless of what neurology tells me tonight she wishes to go home.  The remainder the patient's workup shows a normal CBC, normal chemistry, negative troponin and negative COVID/flu/RSV.  We will discharge the patient with a copy of her MRI per patient wishes.  Patient has changed their mind and would like to wait until we can speak to her neurologist regarding the MRI findings.  We have paged neurology once again.  FINAL CLINICAL IMPRESSION(S) / ED DIAGNOSES   Weakness Right upper extremity swelling    Note:   This document was prepared using Dragon voice recognition software and may include unintentional dictation errors.   Harvest Dark, MD 12/14/22 [id number]    Harvest Dark, MD 12/14/22 [phone]

## 2022-12-14 NOTE — Discharge Instructions (Signed)
Please bring the copy of the MRI at your doctor with him to evaluate tomorrow.  Return to the emergency department for any symptom concerning to yourself.

## 2022-12-15 ENCOUNTER — Inpatient Hospital Stay: Payer: Medicare Other

## 2022-12-15 ENCOUNTER — Encounter: Payer: Self-pay | Admitting: Internal Medicine

## 2022-12-15 DIAGNOSIS — R4781 Slurred speech: Secondary | ICD-10-CM | POA: Diagnosis not present

## 2022-12-15 DIAGNOSIS — Z882 Allergy status to sulfonamides status: Secondary | ICD-10-CM | POA: Diagnosis not present

## 2022-12-15 DIAGNOSIS — G4733 Obstructive sleep apnea (adult) (pediatric): Secondary | ICD-10-CM | POA: Diagnosis present

## 2022-12-15 DIAGNOSIS — I69354 Hemiplegia and hemiparesis following cerebral infarction affecting left non-dominant side: Secondary | ICD-10-CM | POA: Diagnosis not present

## 2022-12-15 DIAGNOSIS — Z79899 Other long term (current) drug therapy: Secondary | ICD-10-CM | POA: Diagnosis not present

## 2022-12-15 DIAGNOSIS — K219 Gastro-esophageal reflux disease without esophagitis: Secondary | ICD-10-CM | POA: Diagnosis present

## 2022-12-15 DIAGNOSIS — Z8261 Family history of arthritis: Secondary | ICD-10-CM | POA: Diagnosis not present

## 2022-12-15 DIAGNOSIS — W19XXXA Unspecified fall, initial encounter: Secondary | ICD-10-CM | POA: Diagnosis not present

## 2022-12-15 DIAGNOSIS — G35 Multiple sclerosis: Secondary | ICD-10-CM | POA: Diagnosis present

## 2022-12-15 DIAGNOSIS — E7800 Pure hypercholesterolemia, unspecified: Secondary | ICD-10-CM | POA: Diagnosis present

## 2022-12-15 DIAGNOSIS — I890 Lymphedema, not elsewhere classified: Secondary | ICD-10-CM | POA: Diagnosis present

## 2022-12-15 DIAGNOSIS — J069 Acute upper respiratory infection, unspecified: Secondary | ICD-10-CM | POA: Diagnosis present

## 2022-12-15 DIAGNOSIS — Z7401 Bed confinement status: Secondary | ICD-10-CM | POA: Diagnosis not present

## 2022-12-15 DIAGNOSIS — Z881 Allergy status to other antibiotic agents status: Secondary | ICD-10-CM | POA: Diagnosis not present

## 2022-12-15 DIAGNOSIS — I6623 Occlusion and stenosis of bilateral posterior cerebral arteries: Secondary | ICD-10-CM | POA: Diagnosis not present

## 2022-12-15 DIAGNOSIS — M7989 Other specified soft tissue disorders: Secondary | ICD-10-CM | POA: Insufficient documentation

## 2022-12-15 DIAGNOSIS — Z886 Allergy status to analgesic agent status: Secondary | ICD-10-CM | POA: Diagnosis not present

## 2022-12-15 DIAGNOSIS — Z880 Allergy status to penicillin: Secondary | ICD-10-CM | POA: Diagnosis not present

## 2022-12-15 DIAGNOSIS — Z8249 Family history of ischemic heart disease and other diseases of the circulatory system: Secondary | ICD-10-CM | POA: Diagnosis not present

## 2022-12-15 DIAGNOSIS — Z888 Allergy status to other drugs, medicaments and biological substances status: Secondary | ICD-10-CM | POA: Diagnosis not present

## 2022-12-15 DIAGNOSIS — M5137 Other intervertebral disc degeneration, lumbosacral region: Secondary | ICD-10-CM | POA: Diagnosis present

## 2022-12-15 DIAGNOSIS — E781 Pure hyperglyceridemia: Secondary | ICD-10-CM | POA: Diagnosis present

## 2022-12-15 DIAGNOSIS — I10 Essential (primary) hypertension: Secondary | ICD-10-CM | POA: Diagnosis present

## 2022-12-15 DIAGNOSIS — F32A Depression, unspecified: Secondary | ICD-10-CM | POA: Diagnosis present

## 2022-12-15 DIAGNOSIS — Z993 Dependence on wheelchair: Secondary | ICD-10-CM | POA: Diagnosis not present

## 2022-12-15 DIAGNOSIS — I6389 Other cerebral infarction: Secondary | ICD-10-CM | POA: Diagnosis not present

## 2022-12-15 DIAGNOSIS — Z66 Do not resuscitate: Secondary | ICD-10-CM | POA: Diagnosis present

## 2022-12-15 DIAGNOSIS — Z1152 Encounter for screening for COVID-19: Secondary | ICD-10-CM | POA: Diagnosis not present

## 2022-12-15 DIAGNOSIS — R531 Weakness: Secondary | ICD-10-CM | POA: Diagnosis present

## 2022-12-15 LAB — CBC
HCT: 42.1 % (ref 36.0–46.0)
Hemoglobin: 13.4 g/dL (ref 12.0–15.0)
MCH: 26.1 pg (ref 26.0–34.0)
MCHC: 31.8 g/dL (ref 30.0–36.0)
MCV: 81.9 fL (ref 80.0–100.0)
Platelets: 269 10*3/uL (ref 150–400)
RBC: 5.14 MIL/uL — ABNORMAL HIGH (ref 3.87–5.11)
RDW: 16.1 % — ABNORMAL HIGH (ref 11.5–15.5)
WBC: 6.9 10*3/uL (ref 4.0–10.5)
nRBC: 0 % (ref 0.0–0.2)

## 2022-12-15 LAB — CREATININE, SERUM
Creatinine, Ser: 0.64 mg/dL (ref 0.44–1.00)
GFR, Estimated: >60 mL/min (ref >60)

## 2022-12-15 MED ORDER — VALPROATE SODIUM 100 MG/ML IV SOLN
1000.0000 mg | Freq: Once | INTRAVENOUS | Status: AC
  Administered 2022-12-15: 1000 mg via INTRAVENOUS
  Filled 2022-12-15: qty 10

## 2022-12-15 MED ORDER — MENTHOL 3 MG MT LOZG
1.0000 | LOZENGE | OROMUCOSAL | Status: DC | PRN
  Filled 2022-12-15: qty 9

## 2022-12-15 MED ORDER — BUSPIRONE HCL 10 MG PO TABS
15.0000 mg | ORAL_TABLET | Freq: Three times a day (TID) | ORAL | Status: DC
  Administered 2022-12-15 – 2022-12-18 (×10): 15 mg via ORAL
  Filled 2022-12-15 (×10): qty 2

## 2022-12-15 MED ORDER — L-METHYLFOLATE 15 MG PO TABS: 15.0000 mg | ORAL_TABLET | Freq: Every day | ORAL | Status: DC

## 2022-12-15 MED ORDER — FLUTICASONE PROPIONATE 50 MCG/ACT NA SUSP
1.0000 | Freq: Every day | NASAL | Status: DC
  Administered 2022-12-15 – 2022-12-18 (×3): 1 via NASAL
  Filled 2022-12-15 (×2): qty 16

## 2022-12-15 MED ORDER — ACETAMINOPHEN 650 MG RE SUPP: 650.0000 mg | Freq: Four times a day (QID) | RECTAL | Status: DC | PRN

## 2022-12-15 MED ORDER — ACETAMINOPHEN 500 MG PO TABS
1000.0000 mg | ORAL_TABLET | Freq: Once | ORAL | Status: AC
  Administered 2022-12-15: 1000 mg via ORAL
  Filled 2022-12-15: qty 2

## 2022-12-15 MED ORDER — ENOXAPARIN SODIUM 60 MG/0.6ML IJ SOSY
0.5000 mg/kg | PREFILLED_SYRINGE | INTRAMUSCULAR | Status: DC
  Administered 2022-12-15 – 2022-12-17 (×3): 45 mg via SUBCUTANEOUS
  Filled 2022-12-15 (×3): qty 0.6

## 2022-12-15 MED ORDER — HYDROCOD POLI-CHLORPHE POLI ER 10-8 MG/5ML PO SUER: 5.0000 mL | Freq: Every evening | ORAL | Status: DC | PRN

## 2022-12-15 MED ORDER — POTASSIUM CHLORIDE 20 MEQ PO PACK
40.0000 meq | PACK | Freq: Once | ORAL | Status: AC
  Administered 2022-12-15: 40 meq via ORAL
  Filled 2022-12-15: qty 2

## 2022-12-15 MED ORDER — MORPHINE SULFATE (PF) 2 MG/ML IV SOLN
2.0000 mg | INTRAVENOUS | Status: DC | PRN
  Administered 2022-12-15 – 2022-12-17 (×6): 2 mg via INTRAVENOUS
  Filled 2022-12-15 (×6): qty 1

## 2022-12-15 MED ORDER — SODIUM CHLORIDE 0.9 % IV SOLN: 1000.0000 mg | Freq: Every day | INTRAVENOUS | Status: DC

## 2022-12-15 MED ORDER — CLOPIDOGREL BISULFATE 75 MG PO TABS
75.0000 mg | ORAL_TABLET | Freq: Every day | ORAL | Status: DC
  Administered 2022-12-15 – 2022-12-17 (×3): 75 mg via ORAL
  Filled 2022-12-15 (×4): qty 1

## 2022-12-15 MED ORDER — LORAZEPAM 0.5 MG PO TABS
0.5000 mg | ORAL_TABLET | Freq: Three times a day (TID) | ORAL | Status: DC | PRN
  Administered 2022-12-15 – 2022-12-17 (×5): 0.5 mg via ORAL
  Filled 2022-12-15 (×5): qty 1

## 2022-12-15 MED ORDER — PANTOPRAZOLE SODIUM 40 MG PO TBEC
40.0000 mg | DELAYED_RELEASE_TABLET | Freq: Every day | ORAL | Status: DC
  Administered 2022-12-15 – 2022-12-18 (×4): 40 mg via ORAL
  Filled 2022-12-15 (×4): qty 1

## 2022-12-15 MED ORDER — SALINE SPRAY 0.65 % NA SOLN: 1.0000 | NASAL | Status: DC | PRN

## 2022-12-15 MED ORDER — ENOXAPARIN SODIUM 40 MG/0.4ML IJ SOSY: 40.0000 mg | PREFILLED_SYRINGE | INTRAMUSCULAR | Status: DC

## 2022-12-15 MED ORDER — AMLODIPINE BESYLATE 5 MG PO TABS
5.0000 mg | ORAL_TABLET | Freq: Every day | ORAL | Status: DC
  Administered 2022-12-15 – 2022-12-18 (×4): 5 mg via ORAL
  Filled 2022-12-15 (×4): qty 1

## 2022-12-15 MED ORDER — LOSARTAN POTASSIUM 50 MG PO TABS
50.0000 mg | ORAL_TABLET | Freq: Every day | ORAL | Status: DC
  Administered 2022-12-15 – 2022-12-18 (×4): 50 mg via ORAL
  Filled 2022-12-15 (×4): qty 1

## 2022-12-15 MED ORDER — ONDANSETRON HCL 4 MG/2ML IJ SOLN
4.0000 mg | Freq: Four times a day (QID) | INTRAMUSCULAR | Status: DC | PRN
  Administered 2022-12-15 – 2022-12-17 (×4): 4 mg via INTRAVENOUS
  Filled 2022-12-15 (×4): qty 2

## 2022-12-15 MED ORDER — ROSUVASTATIN CALCIUM 10 MG PO TABS
5.0000 mg | ORAL_TABLET | Freq: Every day | ORAL | Status: DC
  Administered 2022-12-15 – 2022-12-18 (×4): 5 mg via ORAL
  Filled 2022-12-15 (×5): qty 1

## 2022-12-15 MED ORDER — GABAPENTIN 100 MG PO CAPS
100.0000 mg | ORAL_CAPSULE | Freq: Two times a day (BID) | ORAL | Status: DC
  Administered 2022-12-15 – 2022-12-16 (×2): 100 mg via ORAL
  Filled 2022-12-15 (×2): qty 1

## 2022-12-15 MED ORDER — ACETAMINOPHEN 325 MG PO TABS
650.0000 mg | ORAL_TABLET | Freq: Four times a day (QID) | ORAL | Status: DC | PRN
  Administered 2022-12-15 – 2022-12-18 (×5): 650 mg via ORAL
  Filled 2022-12-15 (×5): qty 2

## 2022-12-15 MED ORDER — ONDANSETRON HCL 4 MG PO TABS
4.0000 mg | ORAL_TABLET | Freq: Four times a day (QID) | ORAL | Status: DC | PRN
  Administered 2022-12-16: 4 mg via ORAL
  Filled 2022-12-15: qty 1

## 2022-12-15 MED ORDER — SODIUM CHLORIDE 0.9 % IV SOLN
1000.0000 mg | Freq: Every day | INTRAVENOUS | Status: DC
  Administered 2022-12-16 – 2022-12-17 (×2): 1000 mg via INTRAVENOUS
  Filled 2022-12-15 (×2): qty 16

## 2022-12-15 MED ORDER — SODIUM CHLORIDE 0.9 % IV SOLN
1000.0000 mg | Freq: Once | INTRAVENOUS | Status: AC
  Administered 2022-12-15: 1000 mg via INTRAVENOUS
  Filled 2022-12-15: qty 4

## 2022-12-15 MED ORDER — BACLOFEN 10 MG PO TABS
10.0000 mg | ORAL_TABLET | Freq: Four times a day (QID) | ORAL | Status: DC
  Administered 2022-12-15 – 2022-12-18 (×13): 10 mg via ORAL
  Filled 2022-12-15 (×13): qty 1

## 2022-12-15 MED ORDER — BUPROPION HCL ER (XL) 150 MG PO TB24
150.0000 mg | ORAL_TABLET | Freq: Every day | ORAL | Status: DC
  Administered 2022-12-15 – 2022-12-18 (×4): 150 mg via ORAL
  Filled 2022-12-15 (×4): qty 1

## 2022-12-15 MED ORDER — SACCHAROMYCES BOULARDII 250 MG PO CAPS
250.0000 mg | ORAL_CAPSULE | Freq: Two times a day (BID) | ORAL | Status: DC
  Administered 2022-12-15 – 2022-12-18 (×7): 250 mg via ORAL
  Filled 2022-12-15 (×7): qty 1

## 2022-12-15 MED ORDER — SERTRALINE HCL 50 MG PO TABS
150.0000 mg | ORAL_TABLET | Freq: Every day | ORAL | Status: DC
  Administered 2022-12-15 – 2022-12-18 (×4): 150 mg via ORAL
  Filled 2022-12-15 (×4): qty 3

## 2022-12-15 MED ORDER — ORAL CARE MOUTH RINSE: 15.0000 mL | OROMUCOSAL | Status: DC | PRN

## 2022-12-15 MED ORDER — DALFAMPRIDINE ER 10 MG PO TB12: 10.0000 mg | ORAL_TABLET | Freq: Two times a day (BID) | ORAL | Status: DC

## 2022-12-15 MED ORDER — LORATADINE 10 MG PO TABS
10.0000 mg | ORAL_TABLET | Freq: Every day | ORAL | Status: DC
  Administered 2022-12-15 – 2022-12-18 (×4): 10 mg via ORAL
  Filled 2022-12-15 (×4): qty 1

## 2022-12-15 MED ORDER — TOPIRAMATE 25 MG PO TABS
25.0000 mg | ORAL_TABLET | Freq: Every day | ORAL | Status: DC
  Administered 2022-12-15 – 2022-12-17 (×3): 25 mg via ORAL
  Filled 2022-12-15 (×3): qty 1

## 2022-12-15 NOTE — Assessment & Plan Note (Signed)
CPAP if desired

## 2022-12-15 NOTE — IPAL (Signed)
  Interdisciplinary Goals of Care Family Meeting   Date carried out: 12/15/2022  Location of the meeting: Bedside  Member's involved: Physician and Family Member or next of kin, husband  Durable Power of Tour manager: Patient  Discussion: We discussed goals of care for [employer] .   I have reviewed medical records including EPIC notes, labs and imaging, assessed the patient and then met with patient and husband to discuss major active diagnoses, plan of care, natural trajectory, prognosis, GOC, EOL wishes, disposition and options including Full code/DNI/DNR and the concept of comfort care if DNR is elected. Questions and concerns were addressed. They are  in agreement to continue current plan of care . Election for DNR status.   Code status:   Code Status: DNR   Disposition: Home  Time spent for the meeting: [place], MD  12/15/2022, 5:01 AM

## 2022-12-15 NOTE — Consult Note (Signed)
0144 code stroke activation RN at bedside reports that Dr. Kerman Passey is requesting a stat consult for slurred speech and bilateral upper extremity weakness.  0152 Stat Consult request placed

## 2022-12-15 NOTE — Assessment & Plan Note (Signed)
Continue Zoloft, buspirone and Ativan

## 2022-12-15 NOTE — Consult Note (Signed)
[facility] TeleNeurology Consult Services  Stat Consult  Patient Name:   Carly [name], Carly [name] Date of Birth:   10/16/1950 Identification Number:   MRN - [id number] Date of Service:   12/14/2022 23:39:26  Diagnosis:       G35 - Multiple sclerosis  Impression 72 year old female with demyelinating lesions in her right basal ganglia, white matter, pons, etc with enhancement after URI consistent with MS exacerbation.  Recommend 5 days of IV Solumedrol 1 gram to try to get her back to her baseline as well as rehab consultation. May need to escalate her immunomodulating agent in the future.Marland KitchenMarland KitchenMarland KitchenRituxan? Thanks for the consultation.   Recommendations: Our recommendations are outlined below.  Dispositions : Neurology will follow   ----------------------------------------------------------------------------------------------------    Metrics: [facility] Notification Time: 12/14/2022 23:36:28 Stamp Time: 12/14/2022 23:39:26 Callback Response Time: 12/14/2022 23:40:20  Primary Provider Notified of Diagnostic Impression and Management Plan on: 12/15/2022 02:30:41     ----------------------------------------------------------------------------------------------------  Chief Complaint: Worsening MS  History of Present Illness: Patient is a 72 year old Female. This is a 72 year old female with primary progressive MS who is presenting with complaints of worsening weakness in both her arms with swelling as well as pain and increased spasticity after URI. The patient is not on much in the way of medications at this point and had diagnosis at [facility] Since the URI, she has had a decline though. Asked to see her for further evaluation.   Medications:  No Anticoagulant use  No Antiplatelet use Reviewed EMR for current medications  Allergies:  Reviewed  Social History: Smoking: No  Family History:  There is no family history of premature  cerebrovascular disease pertinent to this consultation  ROS : 14 Points Review of Systems was performed and was negative except mentioned in HPI.  Past Surgical History: There Is No Surgical History Contributory To Today's Visit   Examination: BP(126/71), Pulse(78),  Neuro Exam: General: Alert,Awake, Oriented, Person  Speech: Dysarthric:  Language: Intact:  Face: Symmetric:  Motor Exam: Bilateral Upper extremity weakness 3/5. Bilateral Lower extremity weakness 2/5.  Spoke with : Dr. Cyril Mourning Ward, DO    Patient / Family was informed the Neurology Consult would occur via TeleHealth consult by way of interactive audio and video telecommunications and consented to receiving care in this manner.  Patient is being evaluated for possible acute neurologic impairment and high probability of imminent or life - threatening deterioration.I spent total of 35 minutes providing care to this patient, including time for face to face visit via telemedicine, review of medical records, imaging studies and discussion of findings with providers, the patient and / or family.   Dr Kennon Portela   [facility] For Inpatient follow-up with [facility] physician please call [facility] [phone]. This is not an outpatient service. Post hospital discharge, please contact hospital directly.  Please do not communicate with [facility] physicians via secure chat. If you have any questions, Please contact [facility]. Please call or reconsult our service if there are any clinical or diagnostic changes.

## 2022-12-15 NOTE — Assessment & Plan Note (Signed)
Stable. 

## 2022-12-15 NOTE — ED Notes (Signed)
Teleneurology visit with Gilford Silvius MD in process.

## 2022-12-15 NOTE — Assessment & Plan Note (Signed)
Continue Cozaar and amlodipine

## 2022-12-15 NOTE — ED Notes (Signed)
Pt saturated in urine due to pure wick malfunction/placement. Pt linens changed and clothing removed. Pt placed on new pure wick and clean line.

## 2022-12-15 NOTE — Assessment & Plan Note (Signed)
As needed pain meds

## 2022-12-15 NOTE — Progress Notes (Signed)
Progress Note   Patient: Carly [name] [id number] DOB: 05-08-1951 DOA: 12/14/2022     0 DOS: the patient was seen and examined on 12/15/2022    Subjective: Patient seen and examined at bedside this morning Appears lethargic Having weakness in both upper and lower extremities Currently deemed to be having multiple sclerosis flare Discussed with neurologist Patient would need steroid therapy intravenously for 5 days because  Brief hospital course: Carly [name] is a 72 y.o. female with medical history significant for multiple sclerosis who at baseline is mostly bedbound, hypertension, depression and anxiety, GERD and migraine presented to the emergency room with a 2-week history of weakness of the upper extremities and slurring of her speech as well as swelling of the right arm.  She also has had nasal congestion for the past 3 weeks but without cough, shortness of breath, fever or chills or chest pain. ED course and data review: Vitals within normal limits.  Respiratory viral panel negative.  Labs including CBC, CMP and troponin mostly unremarkable except for potassium of 3.4.Personally interpreted EKG shows sinus rhythm at 86 with nonspecific ST-T wave changes. Upper extremity venous Doppler negative for DVT MRI cervical spine consistent with history of demyelinating disease, spinal stenosis and foraminal stenosis MRI brain with and without contrast consistent with active demyelination, according to neurologist this is consistent with MS flare up in the setting of respiratory tract infection.  Assessment and Plan: Exacerbation of multiple sclerosis [facility]) Neurology recommends" 5 days of IV Solumedrol 1 gram to try to get her back to her baseline as well as rehab consultation. 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  Constitutional:      General: She is not in acute distress.    Comments: Bedbound female in no distress  HENT:     Head: Normocephalic and atraumatic.  Cardiovascular:     Rate and Rhythm: Normal rate and regular rhythm.     Heart sounds: Normal heart sounds.  Pulmonary:     Effort: Pulmonary effort is normal.     Breath sounds: Normal breath sounds.  Abdominal:     Palpations: Abdomen is soft.     Tenderness: There is no abdominal tenderness.  Musculoskeletal:     Comments: Mild swelling bilateral lower extremities and right upper extremity  Neurological:     Comments: Disuse atrophy lower extremities.  Patient alert and oriented    Vitals:   12/15/22 0655 12/15/22 0730 12/15/22 0749 12/15/22 0800  BP: 139/83 (!) 144/86    Pulse: 91 94    Resp: 16  Temp:   98.4 F (36.9 C)   TempSrc:   Oral   SpO2: 96% 95%    Weight:    87.5 kg  Height:        Data Reviewed: Laboratory results reviewed by me today  Family Communication: No family at bedside at this time  Disposition: Status is: Inpatient Patient still meets inpatient criteria as she needs to complete high-dose steroid therapy for minimum of  5 days   Planned Discharge Destination: Pending clinical course    Time spent: 45 minutes  Author: Verline Lema, MD 12/15/2022 6:45 PM  For on call review [email or web address].

## 2022-12-15 NOTE — Consult Note (Signed)
Neurology Consultation Reason for Consult: Multiple Sclerosis flare Requesting Physician: Marguerita Merles  CC: Worsening weakness  History is obtained from: Patient, caregiver at bedside, chart review  HPI: Carly [name] is a 72 y.o. 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, her outpatient neurologist, last Tuesday, but due to URI symptoms and her caregiver having previously tested positive for COVID, her appointments last week were canceled, other than a virtual PT visit.  She has been having a significant decline in her baseline functional status for the past 2 weeks, with caregiver noting that she used to be able to feed herself 2 weeks ago but now has been requiring hand feeding.  Caregiver notes that this has been a gradual progressive decline and not a sudden onset of decline.  In review of Dr. Armando Reichert notes from November, she has previously been on Tysabri (2014) followed by De Nurse (2017) and then was maintained on monthly IV steroids for some time.  However this was stopped due to concern for side effects and it was noted that both on and off steroids she generally reported feeling gradually worse.  He also noted that given the extensive nature of her white matter disease Catosal was also considered but not 3 testing was negative and given her CSF studies demonstrated 5 oligoclonal bands a diagnosis of multiple sclerosis was made. Her Mini-Mental status examination was noted to be 21 (reduced from 30 in 2018), and examination was notable for left upper extremity strength 5/5, right upper extremity strength 4/5, right iliopsoas 3/5, left iliopsoas 2-/5, with increased weakness and spasticity in the left lower extremity compared to the right in general  At  the time of my evaluation she is complaining of a significant headache, caregiver at bedside notes she gave her some of her home as needed Flonase allergy and cold (Acetaminophen, phenylephrine HCI, chlorpheniramine maleate)  ROS: All other review of systems was negative except as noted in the HPI.  Past Medical History:  Diagnosis Date   Allergy    Anxiety    Aspiration pneumonia (HCC)    Depression    Frequent headaches    H/O   GERD (gastroesophageal reflux disease)    History of chicken pox    History of colon polyps    Hx of migraines    Hypertension    Lymphedema    Multiple sclerosis ([place]) 2011   OSA (obstructive sleep apnea)    PONV (postoperative nausea and vomiting)    Scoliosis    Past Surgical History:  Procedure Laterality Date   BACK SURGERY     CHOLECYSTECTOMY     COLONOSCOPY N/A 04/23/2022   Procedure: COLONOSCOPY;  Surgeon: Toledo, Benay Pike, MD;  Location: [facility] ENDOSCOPY;  Service: Gastroenterology;  Laterality: N/A;   COLONOSCOPY WITH PROPOFOL N/A 05/18/2017   Procedure: COLONOSCOPY WITH PROPOFOL;  Surgeon: [name], MD;  Location: [facility] ENDOSCOPY;  Service: Endoscopy;  Laterality: N/A;   FOOT SURGERY  2015   GALLBLADDER SURGERY  2008   HARDWARE REMOVAL Left 02/14/2016   Procedure: LEFT FOOT REMOVAL DEEP IMPLANT;  Surgeon: [name], MD;  Location: [facility];  Service: Orthopedics;  Laterality: Left;   HERNIA REPAIR     Inguinal Hernia Repair   SPINE SURGERY  2014   Current Outpatient Medications  Medication  Instructions   amLODipine (NORVASC) 5 MG tablet TAKE 1 TABLET BY MOUTH DAILY.   baclofen (LIORESAL) 10 MG tablet Takes '10mg'$  q 4 hours   buPROPion (WELLBUTRIN XL) 150 mg, Oral, Daily   busPIRone (BUSPAR) 15 mg, Oral, 3 times daily   chlorpheniramine-HYDROcodone (TUSSIONEX) 10-8 MG/5ML 5 mLs, Oral, At bedtime PRN   Cholecalciferol (D3-1000 PO) 2,000 Units, Oral, Daily   clopidogrel (PLAVIX) 75 MG tablet TAKE 1 TABLET (75 MG) BY  MOUTH EVERY DAY   Coenzyme Q10 (COQ10 PO) Oral   cyanocobalamin (VITAMIN B12) 1,000 mcg, Intramuscular, Every 30 days   dalfampridine 10 mg, Oral, Every 12 hours   docusate sodium (COLACE) 100 mg, Oral, 2 times daily   fluticasone (FLONASE) 50 MCG/ACT nasal spray 1 spray, Each Nare, Daily   gabapentin (NEURONTIN) 100 MG capsule Take '200mg'$  in the morning, '200mg'$  in the afternoon and '200mg'$  at bedtime.   L-Methylfolate-Algae (DEPLIN 15 PO) 1 tablet, Oral, Daily   L-Methylfolate 15 mg, Oral, Daily   loratadine (CLARITIN) 10 mg, Oral, Daily   LORazepam (ATIVAN) 0.5 MG tablet TAKE 1 TABLET BY MOUTH EVERY 8 HOURS AS DIRECTED   losartan (COZAAR) 50 MG tablet TAKE 1 TABLET BY MOUTH DAILY   MAGNESIUM GLYCINATE PO Oral   METAMUCIL FIBER PO 400 mg, Oral, Every evening   nystatin cream (MYCOSTATIN) 1 application , Topical, 2 times daily   ondansetron (ZOFRAN) 4 mg, Oral, Every 8 hours PRN   pantoprazole (PROTONIX) 40 MG tablet TWICE DAILY AT 7AM AND 5PM   predniSONE (DELTASONE) 10 MG tablet TAKE 3 TABLETS PO QD FOR 3 DAYS THEN TAKE 2 TABLETS PO QD FOR 3 DAYS THEN TAKE 1 TABLET PO QD FOR 3 DAYS THEN TAKE 1/2 TAB PO QD FOR 3 DAYS   rosuvastatin (CRESTOR) 5 MG tablet TAKE 1 TABLET BY MOUTH EVERY MONDAY, WEDNESDAY AND FRIDAY.   saccharomyces boulardii (FLORASTOR) 250 mg, Oral, 2 times daily   sertraline (ZOLOFT) 150 mg, Oral, Daily   sodium chloride (OCEAN) 0.65 % nasal spray 1-2 sprays, Nasal, As needed   topiramate (TOPAMAX) 25 mg, Oral, Nightly   Vitamin D (Ergocalciferol) (DRISDOL) 50,000 Units, Oral, Every 7 days     Family History  Problem Relation Age of Onset   Arthritis Mother    Hypertension Mother    Macular degeneration Mother    Hypertension Father    Hyperlipidemia Father    Drug abuse Maternal Aunt    Heart disease Maternal Grandfather    Diabetes Maternal Grandfather    Kidney disease Paternal Grandmother     Social History:  reports that she has never smoked. She has never used  smokeless tobacco. She reports that she does not drink alcohol and does not use drugs.   Exam: Current vital signs: BP (!) 144/86   Pulse 94   Temp 98.4 F (36.9 C) (Oral)   Resp 16   Ht '5\' 3"'$  (1.6 m)   Wt 87.5 kg   SpO2 95%   BMI 34.19 kg/m  Vital signs in last 24 hours: Temp:  [98.3 F (36.8 C)-98.5 F (36.9 C)] 98.4 F (36.9 C) (03/04 0749) Pulse Rate:  [78-94] 94 (03/04 0730) Resp:  [11-16] 16 (03/04 0655) BP: (109-144)/(69-86) 144/86 (03/04 0730) SpO2:  [95 %-98 %] 95 % (03/04 0730) Weight:  [87.5 kg] 87.5 kg (03/04 0800)   Physical Exam  Constitutional: Appears well-developed and well-nourished.  Psych: Affect anxious, tearful Eyes: No scleral injection HENT: No oropharyngeal obstruction.  MSK: no joint  deformities.  Cardiovascular: Perfusing extremities well Respiratory: Effort normal, non-labored breathing GI: Soft.  No distension. There is no tenderness.  Skin: Warm dry and intact visible skin  Neuro: Mental Status: Patient is awake, alert, oriented to person, place, some details of her situation, but not month or year (which caregiver at bedside reports is baseline) Patient is able to give a limited history She has difficulty with naming, reporting her thumb is fist and then wrist, but is able to repeat and follow simple commands, other times seems to struggle with commands Cranial Nerves: II: Visual Fields are full. Pupils are equal, round, and reactive to light, with some hippus versus afferent pupillary defect III,IV, VI: EOMI without ptosis or diploplia.  Extremely saccadic pursuits V: Facial sensation is symmetric to light touch VII: Facial movement is symmetric.  VIII: hearing is intact to voice X: Uvula elevates symmetrically XI: Shoulder shrug is symmetric. XII: tongue is midline without atrophy or fasciculations.  Motor: Tone is normal. Bulk is normal.  Flexion contraction of the right upper extremity particularly at the hand.  2/5 in the left  upper extremity, 1/5 in the bilateral lower extremities on my evaluation Sensory: Sensation is symmetric to light touch in all 4 extremities Deep Tendon Reflexes: 3+ and symmetric in the brachioradialis and patellae.  Cerebellar: Left upper extremity comes close to her face but does not reach it  Gait:  Wheelchair-bound at baseline  I have reviewed labs in epic and the results pertinent to this consultation are:   Basic Metabolic Panel: Recent Labs  Lab 12/14/22 1713 12/15/22 0517  NA 138  --   K 3.4*  --   CL 102  --   CO2 25  --   GLUCOSE 102*  --   BUN 14  --   CREATININE 0.80 0.64  CALCIUM 9.2  --     CBC: Recent Labs  Lab 12/14/22 1713 12/15/22 0517  WBC 7.5 6.9  HGB 13.5 13.4  HCT 42.2 42.1  MCV 80.8 81.9  PLT 290 269    Coagulation Studies: No results for input(s): "LABPROT", "INR" in the last 72 hours.   I have reviewed the images obtained:  MRI brain personally reviewed, agree with radiology: 1. 1.2 cm focus of restricted diffusion involving the right thalamocapsular region with irregular nodular postcontrast enhancement. Given the somewhat irregular enhancement pattern, finding favored to reflect a small focus of active demyelination. Possible evolving subacute ischemic infarct is difficult to exclude, and could be considered in the correct clinical setting. 2. Few additional subtle nodular foci of enhancement at the left frontal corona radiata and central pons, likely additional small foci of active demyelination. 3. Underlying severe cerebral white matter disease, presumably related to history of multiple sclerosis, although a degree of chronic microvascular ischemic disease may be contributory.  MRI cervical spine personally reviewed, agree with radiology: 1. Patchy multifocal signal abnormality involving the cervical spinal cord as above, consistent with history of demyelinating disease/multiple sclerosis. No evidence for active  demyelination. 2. Multilevel cervical spondylosis with resultant mild to moderate diffuse spinal stenosis at C4-5 through C6-7. Associated mild to moderate right C4 and left C6 foraminal narrowing, with moderate bilateral C7 foraminal stenosis.  Impression: Multiple sclerosis flare in the setting of recent upper respiratory tract infection.  Given the gradually progressive symptoms, story is not consistent with infarction.   Recommendations: -Continue pulse dose steroids for a total of 5 days (1000 mg daily) -Will reach out to [name] for close outpatient  follow-up and plan for long-term immunosuppression -PT/OT evaluation, consider SLP evaluation if her oral intake remains poor to confirm no swallow difficulty contributing -Patient/caregiver counseled not to use any self-administered/caregiver administered medications for safety; all medications should come from nurse and be registered as nonformulary medications if needed; recommended not to use Flonase headache tablet at this time due to potential interaction with steroids -One-time dose of Depakote 1000 mg ordered for tonight for headache control, noting patient has an allergy listed to ibuprofen, will avoid ketorolac.   -Neurology will follow along  Lesleigh Noe MD-PhD [facility] [phone] [facility] coverage for [facility] is from 8 AM to 4 AM in-house and 4 PM to 8 PM by telephone/video. 8 PM to 8 AM emergent questions or overnight urgent questions should be addressed to Teleneurology On-call or [facility] neurohospitalist; contact information can be found on AMION

## 2022-12-15 NOTE — Assessment & Plan Note (Signed)
Continue baclofen

## 2022-12-15 NOTE — ED Notes (Signed)
Spoke with pt's husband and updated him that pt has a room assigned. Also asked pt's husband to bring her MS medication per pharmacy. Pt's husband informed me that he will be sending a care giver to sit with pt within the hour.

## 2022-12-15 NOTE — Progress Notes (Signed)
PHARMACIST - PHYSICIAN ORDER COMMUNICATION  CONCERNING: P&T Medication Policy on Herbal Medications  DESCRIPTION:  This patient's order for:  L- Methylfolate  has been noted.  This product(s) is classified as an "herbal" or natural product. Due to a lack of definitive safety studies or FDA approval, nonstandard manufacturing practices, plus the potential risk of unknown drug-drug interactions while on inpatient medications, the Pharmacy and Therapeutics Committee does not permit the use of "herbal" or natural products of this type within [facility].   ACTION TAKEN: The pharmacy department is unable to verify this order at this time and your patient has been informed of this safety policy. Please reevaluate patient's clinical condition at discharge and address if the herbal or natural product(s) should be resumed at that time.

## 2022-12-15 NOTE — Assessment & Plan Note (Signed)
Negative venous Doppler right upper extremity

## 2022-12-15 NOTE — H&P (Signed)
History and Physical    Patient: Carly [name] [id number] DOB: Jun 09, 1951 DOA: 12/14/2022 DOS: the patient was seen and examined on 12/15/2022 PCP: Einar Pheasant, MD  Patient coming from: Home  Chief Complaint:  Chief Complaint  Patient presents with   Weakness   Arm Swelling    HPI: Carly [name] is a 72 y.o. female with medical history significant for multiple sclerosis who at baseline is mostly bedbound, hypertension, depression and anxiety, GERD and migraine presented to the emergency room with a 2-week history of weakness of the upper extremities and slurring of her speech as well as swelling of the right arm.  She also has had nasal congestion for the past 3 weeks but without cough, shortness of breath, fever or chills or chest pain. ED course and data review: Vitals within normal limits.  Respiratory viral panel negative.  Labs including CBC, CMP and troponin mostly unremarkable except for potassium of 3.4.Personally interpreted EKG shows sinus rhythm at 86 with nonspecific ST-T wave changes. Upper extremity venous Doppler negative for DVT MRI cervical spine consistent with history of demyelinating disease, spinal stenosis and foraminal stenosis MRI brain with and without contrast consistent with active demyelination, possible evolving subacute ischemic infarct difficult to exclude as further detailed below: IMPRESSION: 1. 1.2 cm focus of restricted diffusion involving the right thalamocapsular region with irregular nodular postcontrast enhancement. Given the somewhat irregular enhancement pattern, finding favored to reflect a small focus of active demyelination. Possible evolving subacute ischemic infarct is difficult to exclude, and could be considered in the correct clinical setting. 2. Few additional subtle nodular foci of enhancement at the left frontal corona radiata and central pons, likely additional small foci of active demyelination. 3. Underlying severe  cerebral white matter disease, presumably related to history of multiple sclerosis, although a degree of chronic microvascular ischemic disease may be contributory.  Teleneurology consult was done with recommendation for 5 days of IV Solu-Medrol 1 g to get her back to baseline as well as rehab consult. Please see note for details.  Hospitalist thus consulted for admission for MS flare   Review of Systems: As mentioned in the history of present illness. All other systems reviewed and are negative.  Past Medical History:  Diagnosis Date   Allergy    Anxiety    Aspiration pneumonia (HCC)    Depression    Frequent headaches    H/O   GERD (gastroesophageal reflux disease)    History of chicken pox    History of colon polyps    Hx of migraines    Hypertension    Lymphedema    Multiple sclerosis ([place]) 2011   OSA (obstructive sleep apnea)    PONV (postoperative nausea and vomiting)    Scoliosis    Past Surgical History:  Procedure Laterality Date   BACK SURGERY     CHOLECYSTECTOMY     COLONOSCOPY N/A 04/23/2022   Procedure: COLONOSCOPY;  Surgeon: Toledo, Benay Pike, MD;  Location: [facility] ENDOSCOPY;  Service: Gastroenterology;  Laterality: N/A;   COLONOSCOPY WITH PROPOFOL N/A 05/18/2017   Procedure: COLONOSCOPY WITH PROPOFOL;  Surgeon: [name], MD;  Location: [facility] ENDOSCOPY;  Service: Endoscopy;  Laterality: N/A;   FOOT SURGERY  2015   GALLBLADDER SURGERY  2008   HARDWARE REMOVAL Left 02/14/2016   Procedure: LEFT FOOT REMOVAL DEEP IMPLANT;  Surgeon: [name], MD;  Location: [place];  Service: Orthopedics;  Laterality: Left;   HERNIA REPAIR     Inguinal Hernia Repair  SPINE SURGERY  2014   Social History:  reports that she has never smoked. She has never used smokeless tobacco. She reports that she does not drink alcohol and does not use drugs.  Allergies  Allergen Reactions   Ibuprofen Swelling and Other (See Comments)   Sulfamethoxazole-Trimethoprim  Itching   Penicillin G Rash   Asa [Aspirin] Swelling   Buspirone Other (See Comments)    Headaches     Levofloxacin Other (See Comments)    "weakness"   Lexapro [Escitalopram Oxalate] Other (See Comments)    Weakness    Pollen Extract Hives   Ceftin [Cefuroxime Axetil] Rash   Penicillins Rash   Sulfa Antibiotics Rash    Family History  Problem Relation Age of Onset   Arthritis Mother    Hypertension Mother    Macular degeneration Mother    Hypertension Father    Hyperlipidemia Father    Drug abuse Maternal Aunt    Heart disease Maternal Grandfather    Diabetes Maternal Grandfather    Kidney disease Paternal Grandmother     Prior to Admission medications   Medication Sig Start Date End Date Taking? Authorizing Provider  amLODipine (NORVASC) 5 MG tablet TAKE 1 TABLET BY MOUTH DAILY. 03/12/22   Einar Pheasant, MD  baclofen (LIORESAL) 10 MG tablet Takes '10mg'$  q 4 hours 08/13/22   Sater, Nanine Means, MD  buPROPion (WELLBUTRIN XL) 150 MG 24 hr tablet Take 1 tablet (150 mg total) by mouth daily. 10/28/22   Norman Clay, MD  busPIRone (BUSPAR) 15 MG tablet TAKE 1 TABLET BY MOUTH 3 TIMES DAILY 10/14/22   Sater, Nanine Means, MD  chlorpheniramine-HYDROcodone (TUSSIONEX) 10-8 MG/5ML Take 5 mLs by mouth at bedtime as needed for cough. 12/12/22   Theresia Lo, NP  Cholecalciferol (D3-1000 PO) Take 2,000 Units by mouth daily.    [provider]  clopidogrel (PLAVIX) 75 MG tablet TAKE 1 TABLET (75 MG) BY MOUTH EVERY DAY 11/26/21   Dutch Quint B, FNP  Coenzyme Q10 (COQ10 PO) Take by mouth.    [provider]  cyanocobalamin (,VITAMIN B-12,) 1000 MCG/ML injection Inject 1 mL (1,000 mcg total) into the muscle every 30 (thirty) days. 12/26/21   Raulkar, Clide Deutscher, MD  dalfampridine 10 MG TB12 Take 1 tablet (10 mg total) by mouth every 12 (twelve) hours. 05/14/22   Sater, Nanine Means, MD  docusate sodium (COLACE) 100 MG capsule Take 100 mg by mouth 2 (two) times daily.     [provider]  fluticasone (FLONASE) 50 MCG/ACT nasal spray Place into the nose. 02/22/20   [provider]  gabapentin (NEURONTIN) 100 MG capsule Take '200mg'$  in the morning, '200mg'$  in the afternoon and '200mg'$  at bedtime. 09/15/22   Sater, Nanine Means, MD  L-Methylfolate 15 MG TABS Take 1 tablet (15 mg total) by mouth daily. 10/28/22   Norman Clay, MD  L-Methylfolate 15 MG TABS Take 1 tablet (15 mg total) by mouth daily. 11/10/22 02/08/23  Norman Clay, MD  L-Methylfolate-Algae (DEPLIN 15 PO) Take by mouth.    [provider]  loratadine (CLARITIN) 10 MG tablet Take 10 mg by mouth daily.    [provider]  LORazepam (ATIVAN) 0.5 MG tablet TAKE 1 TABLET BY MOUTH EVERY 8 HOURS AS DIRECTED 11/11/22   Sater, Nanine Means, MD  losartan (COZAAR) 50 MG tablet TAKE 1 TABLET BY MOUTH DAILY 09/23/22   Einar Pheasant, MD  MAGNESIUM GLYCINATE PO Take by mouth.    [provider]  METAMUCIL FIBER  PO Take by mouth 2 (two) times daily.    [provider]  nystatin cream (MYCOSTATIN) Apply 1 application topically 2 (two) times daily. 10/04/21   McLean-Scocuzza, Nino Glow, MD  ondansetron (ZOFRAN) 4 MG tablet Take 1 tablet (4 mg total) by mouth every 8 (eight) hours as needed for nausea or vomiting. 04/18/22   [name], MD  pantoprazole (PROTONIX) 40 MG tablet TWICE DAILY AT 7AM AND 5PM 10/27/22   Sater, Nanine Means, MD  predniSONE (DELTASONE) 10 MG tablet TAKE 3 TABLETS PO QD FOR 3 DAYS THEN TAKE 2 TABLETS PO QD FOR 3 DAYS THEN TAKE 1 TABLET PO QD FOR 3 DAYS THEN TAKE 1/2 TAB PO QD FOR 3 DAYS 11/21/22   Tomasita Morrow, NP  rosuvastatin (CRESTOR) 5 MG tablet TAKE 1 TABLET BY MOUTH EVERY MONDAY, WEDNESDAY AND FRIDAY. 03/12/22   Einar Pheasant, MD  saccharomyces boulardii (FLORASTOR) 250 MG capsule Take 1 capsule (250 mg total) by mouth 2 (two) times daily. 12/27/20   Love, Ivan Anchors, PA-C  sertraline (ZOLOFT) 100 MG tablet Take 150 mg by mouth daily. 05/12/22    [provider]  sodium chloride (OCEAN) 0.65 % nasal spray Place 1-2 sprays into the nose as needed.    [provider]  topiramate (TOPAMAX) 25 MG tablet Take 1 tablet (25 mg total) by mouth at bedtime. 09/15/22   Raulkar, Clide Deutscher, MD  Vitamin D, Ergocalciferol, (DRISDOL) 1.25 MG (50000 UNIT) CAPS capsule Take 1 capsule (50,000 Units total) by mouth every 7 (seven) days. 11/13/22   [name], MD    Physical Exam: Vitals:   12/14/22 1630 12/14/22 2009 12/15/22 0026 12/15/22 0030  BP: 113/69 127/73 126/71   Pulse: 82 82 78   Resp: '15 16 15   '$ Temp:  98.3 F (36.8 C)  98.5 F (36.9 C)  TempSrc:  Oral  Oral  SpO2: 96% 97% 98%   Height:       Physical Exam Vitals and nursing note reviewed.  Constitutional:      General: She is not in acute distress.    Comments: Bedbound female in no distress  HENT:     Head: Normocephalic and atraumatic.  Cardiovascular:     Rate and Rhythm: Normal rate and regular rhythm.     Heart sounds: Normal heart sounds.  Pulmonary:     Effort: Pulmonary effort is normal.     Breath sounds: Normal breath sounds.  Abdominal:     Palpations: Abdomen is soft.     Tenderness: There is no abdominal tenderness.  Musculoskeletal:     Comments: Mild swelling bilateral lower extremities and right upper extremity  Neurological:     Comments: Disuse atrophy lower extremities.  Patient alert and oriented     Labs on Admission: I have personally reviewed following labs and imaging studies  CBC: Recent Labs  Lab 12/14/22 1713  WBC 7.5  HGB 13.5  HCT 42.2  MCV 80.8  PLT [id number]   Basic Metabolic Panel: Recent Labs  Lab 12/14/22 1713  NA 138  K 3.4*  CL 102  CO2 25  GLUCOSE 102*  BUN 14  CREATININE 0.80  CALCIUM 9.2   GFR: CrCl cannot be calculated (Unknown ideal weight.). Liver Function Tests: Recent Labs  Lab 12/14/22 1713  AST 25  ALT 20  ALKPHOS 74  BILITOT 0.5  PROT 7.1  ALBUMIN 4.1   No results for  input(s): "LIPASE", "AMYLASE" in the last 168 hours. No results  for input(s): "AMMONIA" in the last 168 hours. Coagulation Profile: No results for input(s): "INR", "PROTIME" in the last 168 hours. Cardiac Enzymes: No results for input(s): "CKTOTAL", "CKMB", "CKMBINDEX", "TROPONINI" in the last 168 hours. BNP (last 3 results) No results for input(s): "PROBNP" in the last 8760 hours. HbA1C: No results for input(s): "HGBA1C" in the last 72 hours. CBG: No results for input(s): "GLUCAP" in the last 168 hours. Lipid Profile: No results for input(s): "CHOL", "HDL", "LDLCALC", "TRIG", "CHOLHDL", "LDLDIRECT" in the last 72 hours. Thyroid Function Tests: No results for input(s): "TSH", "T4TOTAL", "FREET4", "T3FREE", "THYROIDAB" in the last 72 hours. Anemia Panel: No results for input(s): "VITAMINB12", "FOLATE", "FERRITIN", "TIBC", "IRON", "RETICCTPCT" in the last 72 hours. Urine analysis:    Component Value Date/Time   COLORURINE YELLOW (A) 09/13/2022 1544   APPEARANCEUR Clear 10/24/2022 1300   LABSPEC 1.016 09/13/2022 1544   PHURINE 7.0 09/13/2022 1544   GLUCOSEU Negative 10/24/2022 1300   GLUCOSEU NEGATIVE 08/16/2018 1633   HGBUR NEGATIVE 09/13/2022 1544   BILIRUBINUR Negative 10/24/2022 1300   KETONESUR NEGATIVE 09/13/2022 1544   PROTEINUR Negative 10/24/2022 1300   PROTEINUR NEGATIVE 09/13/2022 1544   UROBILINOGEN 0.2 08/16/2018 1633   NITRITE Negative 10/24/2022 1300   NITRITE NEGATIVE 09/13/2022 1544   LEUKOCYTESUR Negative 10/24/2022 1300   LEUKOCYTESUR NEGATIVE 09/13/2022 1544    Radiological Exams on Admission: MR CERVICAL SPINE W WO CONTRAST  Result Date: 12/14/2022 CLINICAL DATA:  Initial evaluation for multiple sclerosis, left-sided weakness. EXAM: MRI CERVICAL SPINE WITHOUT AND WITH CONTRAST TECHNIQUE: Multiplanar and multiecho pulse sequences of the cervical spine, to include the craniocervical junction and cervicothoracic junction, were obtained without and with  intravenous contrast. CONTRAST:  40m GADAVIST GADOBUTROL 1 MMOL/ML IV SOLN COMPARISON:  Prior radiograph from 11/24/2022 and MRI from 04/02/2018. FINDINGS: Alignment: Reversal of the normal cervical lordosis. Trace degenerative anterolisthesis of C3 on C4 and C4 on C5. Vertebrae: Vertebral body height maintained without acute or chronic fracture. Bone marrow signal intensity within normal limits. No worrisome osseous lesions. No abnormal marrow edema or enhancement. Cord: Patchy signal abnormality seen involving the central/dorsal cord at the level of C2 (series 8, image 1). Hazy signal abnormality involves the right dorsal cord at the level of C2-3 (series 8, image 5). Hazy and patchy signal abnormality seen involving the central and dorsal cord at C3-4 (series 8, image 9). Patchy signal abnormality involves the right cord at the level of C4 (series 8, image 11). Patchy signal abnormality involves the left greater than right cord at the level of C5-6 (series 8, image 17) patchy signal abnormality seen involving the bilateral cord at the level of C7 (series 8, image 24). Findings consistent with history of demyelinating disease/multiple sclerosis. No abnormal enhancement to suggest active demyelination. Slight focal cord atrophy noted at the level of C3-4. Posterior Fossa, vertebral arteries, paraspinal tissues: Paraspinous soft tissues demonstrate no acute or significant finding. Normal flow voids seen within the vertebral arteries bilaterally. Disc levels: C2-C3: Small central disc protrusion minimally indents the ventral thecal sac. Mild left-sided facet hypertrophy. No canal or foraminal stenosis. C3-C4: Mild disc bulge with uncovertebral spurring. No significant spinal stenosis. Mild to moderate right C4 foraminal narrowing. Left neural foramina remains patent. C4-C5: Anterolisthesis. Mild disc bulge with uncovertebral spurring. Flattening of the ventral thecal sac with resultant mild spinal stenosis. Foramina  remain patent. C5-C6: Degenerative vertebral disc space narrowing with diffuse disc osteophyte complex. Broad posterior component flattens and partially faces the ventral thecal sac. Mild cord  flattening with mild spinal stenosis. Mild right with mild-to-moderate left C6 foraminal narrowing. C6-C7: Degenerative intervertebral disc space narrowing with diffuse disc osteophyte complex. Flattening and partial effacement of the ventral thecal sac. Moderate spinal stenosis. Moderate bilateral C7 foraminal narrowing. C7-T1: Negative interspace. Mild facet hypertrophy. No canal or foraminal stenosis. IMPRESSION: 1. Patchy multifocal signal abnormality involving the cervical spinal cord as above, consistent with history of demyelinating disease/multiple sclerosis. No evidence for active demyelination. 2. Multilevel cervical spondylosis with resultant mild to moderate diffuse spinal stenosis at C4-5 through C6-7. Associated mild to moderate right C4 and left C6 foraminal narrowing, with moderate bilateral C7 foraminal stenosis. Electronically Signed   By: Jeannine Boga M.D.   On: 12/14/2022 22:00   MR Brain W and Wo Contrast  Result Date: 12/14/2022 CLINICAL DATA:  Initial evaluation for multiple sclerosis, left-sided weakness. EXAM: MRI HEAD WITHOUT AND WITH CONTRAST TECHNIQUE: Multiplanar, multiecho pulse sequences of the brain and surrounding structures were obtained without and with intravenous contrast. CONTRAST:  57m GADAVIST GADOBUTROL 1 MMOL/ML IV SOLN COMPARISON:  Prior MRI from 03/28/2022. FINDINGS: Brain: Mildly advanced cerebral atrophy. Again seen is extensive patchy and confluent T2/FLAIR signal abnormality throughout the periventricular, deep, and juxta cortical white matter as well as the pons. Findings presumably in large part related to history of multiple sclerosis. A degree of chronic microvascular ischemic disease could be contributory. Multiple scattered corresponding T1 black holes. Patchy  restricted diffusion involving the right thalamic capsular region measuring 1.2 cm (series 9, image 25). Area demonstrates irregular nodular postcontrast enhancement (series 18, image 85). Finding favored to reflect a focus of active demyelination, although an evolving subacute ischemic infarct is difficult to exclude. Few additional subtle nodular foci of enhancement at the left frontal corona radiata (series 18, image 103) as well as the central pons (series 18, image 39), likely small foci of active demyelination. No other evidence for active demyelination. No acute or subacute ischemia elsewhere within the brain. Gray-white matter differentiation otherwise maintained. No acute intracranial hemorrhage. Scattered chronic micro hemorrhages noted about both cerebral hemispheres and cerebellum, likely hypertensive in nature. No mass lesion, midline shift or mass effect. Ventricular prominence related to global parenchymal volume loss without hydrocephalus, stable. Partially empty sella noted. Vascular: Major intracranial vascular flow voids are maintained. Skull and upper cervical spine: Craniocervical junction within normal limits. Bone marrow signal intensity normal. No scalp soft tissue abnormality. Sinuses/Orbits: Globes and orbital soft tissues within normal limits. Paranasal sinuses are largely clear. Trace left mastoid effusion, of doubtful significance. Other: None. IMPRESSION: 1. 1.2 cm focus of restricted diffusion involving the right thalamocapsular region with irregular nodular postcontrast enhancement. Given the somewhat irregular enhancement pattern, finding favored to reflect a small focus of active demyelination. Possible evolving subacute ischemic infarct is difficult to exclude, and could be considered in the correct clinical setting. 2. Few additional subtle nodular foci of enhancement at the left frontal corona radiata and central pons, likely additional small foci of active demyelination. 3.  Underlying severe cerebral white matter disease, presumably related to history of multiple sclerosis, although a degree of chronic microvascular ischemic disease may be contributory. Electronically Signed   By: BJeannine BogaM.D.   On: 12/14/2022 21:51   UKoreaVenous Img Upper Uni Right(DVT)  Result Date: 12/14/2022 CLINICAL DATA:  Swelling EXAM: Right UPPER EXTREMITY VENOUS DOPPLER ULTRASOUND TECHNIQUE: Gray-scale sonography with graded compression, as well as color Doppler and duplex ultrasound were performed to evaluate the upper extremity deep venous system from the  level of the subclavian vein and including the jugular, axillary, basilic, radial, ulnar and upper cephalic vein. Spectral Doppler was utilized to evaluate flow at rest and with distal augmentation maneuvers. COMPARISON:  None Available. FINDINGS: Internal Jugular Vein: No evidence of thrombus. Normal compressibility, respiratory phasicity and response to augmentation. Subclavian Vein: No evidence of thrombus. Normal compressibility, respiratory phasicity and response to augmentation. Axillary Vein: No evidence of thrombus. Normal compressibility, respiratory phasicity and response to augmentation. Cephalic Vein: No evidence of thrombus. Normal compressibility, respiratory phasicity and response to augmentation. Basilic Vein: No evidence of thrombus. Normal compressibility, respiratory phasicity and response to augmentation. Brachial Veins: No evidence of thrombus. Normal compressibility, respiratory phasicity and response to augmentation. Radial Veins: No evidence of thrombus. Normal compressibility, respiratory phasicity and response to augmentation. Ulnar Veins: No evidence of thrombus. Normal compressibility, respiratory phasicity and response to augmentation. Venous Reflux:  None visualized. Other Findings:  None visualized. IMPRESSION: No evidence of DVT within the right upper extremity. Electronically Signed   By: Valetta Mole M.D.    On: 12/14/2022 17:58     Data Reviewed: Relevant notes from primary care and specialist visits, past discharge summaries as available in EHR, including Care Everywhere. Prior diagnostic testing as pertinent to current admission diagnoses Updated medications and problem lists for reconciliation ED course, including vitals, labs, imaging, treatment and response to treatment Triage notes, nursing and pharmacy notes and ED provider's notes Notable results as noted in HPI   Assessment and Plan: * Exacerbation of multiple sclerosis [facility]) Neurology recommends" 5 days of IV Solumedrol 1 gram to try to get her back to her baseline as well as rehab consultation.May need to escalate her immunomodulating agent in the future.Marland KitchenMarland KitchenMarland KitchenRituxan?" IV Solu-Medrol 1 g daily x 5 days Neurology consult PT consult  Respiratory tract infection Continue home decongestants and nasal sprays Lozenges for sore throat Respiratory viral panel was negative Will get chest x-ray  Swelling of right upper extremity Negative venous Doppler right upper extremity  Lymphedema Stable  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 Inpatient status is judged to be reasonable and necessary in order to provide the required intensity of service to ensure the patient's safety. The patient's presenting symptoms, physical exam findings, and initial radiographic and  laboratory data in the context of their chronic comorbidities is felt to place them at high risk for further clinical deterioration. Furthermore, it is not anticipated that the patient will be medically stable for discharge from the hospital within 2 midnights of admission.   * I certify that at the point of admission it is my clinical judgment that the patient will require inpatient hospital care spanning beyond 2 midnights from the point of admission due to high intensity of service, high risk for further deterioration and high frequency of surveillance required.*  Author: Athena Masse, MD 12/15/2022 3:01 AM  For on call review [email or web address].

## 2022-12-15 NOTE — ED Provider Notes (Signed)
2:20 AM  Pt evaluated by the teleneurologist who has reviewed her imaging and feels that this is consistent with an MS flare and recommends high-dose steroids for the next 3 to 5 days.  Patient and family comfortable with plan.  Will admit to the hospitalist.   Lorence Nagengast, Delice Bison, DO 12/15/22 0222

## 2022-12-15 NOTE — Assessment & Plan Note (Signed)
Continue home decongestants and nasal sprays Lozenges for sore throat Respiratory viral panel was negative Will get chest x-ray

## 2022-12-15 NOTE — Assessment & Plan Note (Signed)
Neurology recommends" 5 days of IV Solumedrol 1 gram to try to get her back to her baseline as well as rehab consultation.May need to escalate her immunomodulating agent in the future.Marland KitchenMarland KitchenMarland KitchenRituxan?" IV Solu-Medrol 1 g daily x 5 days Neurology consult PT consult

## 2022-12-15 NOTE — ED Notes (Signed)
Pt unable to use hands to push call light, or use her hands to eat/drink. Pt requires assistance. Kept pt's door open, so that she was able to call out and get assistance when needed. Receiving RN Ovid Curd notified via secure chat.

## 2022-12-16 DIAGNOSIS — G35 Multiple sclerosis: Secondary | ICD-10-CM | POA: Diagnosis not present

## 2022-12-16 LAB — BASIC METABOLIC PANEL
Anion gap: 9 (ref 5–15)
BUN: 14 mg/dL (ref 8–23)
CO2: 23 mmol/L (ref 22–32)
Calcium: 9.4 mg/dL (ref 8.9–10.3)
Chloride: 103 mmol/L (ref 98–111)
Creatinine, Ser: 0.67 mg/dL (ref 0.44–1.00)
GFR, Estimated: >60 mL/min (ref >60)
Glucose, Bld: 130 mg/dL — ABNORMAL HIGH (ref 70–99)
Potassium: 3.6 mmol/L (ref 3.5–5.1)
Sodium: 135 mmol/L (ref 135–145)

## 2022-12-16 LAB — MAGNESIUM: Magnesium: 2.1 mg/dL (ref 1.7–2.4)

## 2022-12-16 MED ORDER — DOCUSATE SODIUM 100 MG PO CAPS
200.0000 mg | ORAL_CAPSULE | Freq: Two times a day (BID) | ORAL | Status: DC
  Administered 2022-12-16 – 2022-12-17 (×2): 200 mg via ORAL
  Filled 2022-12-16 (×2): qty 2

## 2022-12-16 MED ORDER — MELATONIN 5 MG PO TABS
5.0000 mg | ORAL_TABLET | Freq: Once | ORAL | Status: DC
  Filled 2022-12-16: qty 1

## 2022-12-16 MED ORDER — GABAPENTIN 100 MG PO CAPS
100.0000 mg | ORAL_CAPSULE | Freq: Every morning | ORAL | Status: DC
  Administered 2022-12-17 – 2022-12-18 (×2): 100 mg via ORAL
  Filled 2022-12-16 (×2): qty 1

## 2022-12-16 MED ORDER — GABAPENTIN 100 MG PO CAPS
200.0000 mg | ORAL_CAPSULE | Freq: Two times a day (BID) | ORAL | Status: DC
  Administered 2022-12-16 – 2022-12-17 (×4): 200 mg via ORAL
  Filled 2022-12-16 (×4): qty 2

## 2022-12-16 MED ORDER — DALFAMPRIDINE ER 10 MG PO TB12
10.0000 mg | ORAL_TABLET | Freq: Two times a day (BID) | ORAL | Status: DC
  Administered 2022-12-16 – 2022-12-18 (×5): 10 mg via ORAL
  Filled 2022-12-16 (×5): qty 1

## 2022-12-16 NOTE — Evaluation (Signed)
Occupational Therapy Evaluation Patient Details Name: Carly [name] MRN: [id number] DOB: 12/24/1950 Today's Date: 12/16/2022   History of Present Illness Carly [name] is a 72 y.o. female with medical history significant for multiple sclerosis who at baseline is mostly bedbound, hypertension, depression and anxiety, GERD and migraine presented to the emergency room with a 2-week history of weakness of the upper extremities and slurring of her speech as well as swelling of the right arm.   Clinical Impression   Patient seen for OT evaluation, PCA present. Both agreeable to OT. Pt lives with her spouse and has PCA from 8 am-9 pm every day of the week. PTA pt was requiring hoyer lift for transfers and received assistance for all ADLs/IADLs. Pt is currently functioning at Max-Total A for all self-care tasks. 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 Pt endorsed OT goals of improving BUE function and increased independence with self-care tasks (specifically self-feeding). All further OT needs can be met in next venue of care. Pt wishes to return to OPOT at [facility] where she has previously received therapy. No further acute OT needs identified. Will sign off.   Recommendations for follow up therapy are one component of a multi-disciplinary discharge planning process, led by the attending physician.  Recommendations may be updated based on patient status, additional functional criteria and insurance authorization.   Follow Up Recommendations  Outpatient OT     Assistance Recommended at Discharge Frequent or constant Supervision/Assistance  Patient can return home with the following Two people to help with walking and/or transfers;Two people to help with bathing/dressing/bathroom;Assistance with cooking/housework;Assist for transportation;Help with stairs or ramp for entrance    Functional Status Assessment  Patient  has had a recent decline in their functional status and demonstrates the ability to make significant improvements in function in a reasonable and predictable amount of time.  Equipment Recommendations  None recommended by OT    Recommendations for Other Services       Precautions / Restrictions Precautions Precautions: Fall Restrictions Weight Bearing Restrictions: No      Mobility Bed Mobility               General bed mobility comments: NT, total A at baseline    Transfers                   General transfer comment: NT, DEP hoyer lift transfer at baseline      Balance               ADL either performed or assessed with clinical judgement   ADL Overall ADL's : Needs assistance/impaired Eating/Feeding: Maximal assistance;Bed level Eating/Feeding Details (indicate cue type and reason): Pt received in bed with PCA completing self-feeding. OT attempting to have pt bring LUE to touch nose to simulate self-feeding - able to bring halfway to nose (PCA reports pt normally able to bring utensils to mouth with increased time). HOH assistance required in order to reach face.         General ADL Comments: Currently Max-total A for all self-care tasks.     Vision Baseline Vision/History: 4 Cataracts;1 Wears glasses Additional Comments: Pt reports vision is worsening, wears glasses 24/7 and has cataracts but not a candidate for surgery per PCA.     Perception     Praxis      Pertinent Vitals/Pain Pain Assessment Pain Assessment: Faces Faces Pain Scale: Hurts a  little bit Pain Location: B shoulders, neck pain Pain Descriptors / Indicators: Grimacing, Sore Pain Intervention(s): Monitored during session, Repositioned, Premedicated before session, Limited activity within patient's tolerance (BUEs elevated on pillows)     Hand Dominance Right   Extremity/Trunk Assessment Upper Extremity Assessment Upper Extremity Assessment: RUE deficits/detail;LUE  deficits/detail RUE Deficits / Details: Previous CVA affecting R side, hand positioned in closed fist position (possible contracture?) - OT able to passively extend digits with time. Thumb opposition impaired, WE/WF 3/4 full ROM passively, SF <1/2 full ROM passively (pain with movement). RUE: Shoulder pain with ROM RUE Coordination: decreased fine motor;decreased gross motor LUE Deficits / Details: spasticity noted (elbow flexion MAS grossly 3/4), thumb opposition impaired, full composite fist (hand in closed fist position after asking pt to flex fingers "getting stuck" due to spasticity, pt able to actively extend digits with increased time and VC), WE/WF 3/4 full ROM passively, SF <1/2 full ROM passively (pain with movement) LUE: Shoulder pain with ROM LUE Coordination: decreased fine motor;decreased gross motor   Lower Extremity Assessment Lower Extremity Assessment: Generalized weakness       Communication Communication Communication: No difficulties   Cognition Arousal/Alertness: Awake/alert Behavior During Therapy: WFL for tasks assessed/performed, Flat affect Overall Cognitive Status: Within Functional Limits for tasks assessed               General Comments       Exercises Other Exercises Other Exercises: OT provided education re: role of OT, OT POC, post acute recs, sitting up for all meals, EOB/OOB mobility with assistance, home/fall safety, BUE exercises, angled built up feeding utensils   Shoulder Instructions      Home Living Family/patient expects to be discharged to:: Private residence ([facility] [employer]) Living Arrangements: Spouse/significant other Available Help at Discharge: Family;Personal care attendant;Available 24 hours/day Type of Home: House Home Access: Level entry     Home Layout: One level     Bathroom Shower/Tub: Occupational psychologist: Standard Bathroom Accessibility: Yes   Home Equipment: Tub  bench;Wheelchair - Psychologist, educational;Other (comment) (hoyer lift) Adaptive Equipment: Feeding equipment (built up utensils, long handled utensils) Additional Comments: PCA from 8 am-9 pm 7 days/week. 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 Has extensive HEPs at home that PCA goes through with pt as well as ergo hand exerciser, theraband, theraputty.      Prior Functioning/Environment Prior Level of Function : Needs assist       Physical Assist : Mobility (physical);ADLs (physical) Mobility (physical): Bed mobility;Transfers ADLs (physical): Feeding;Grooming;Bathing;Dressing;Toileting;IADLs Mobility Comments: Uses hoyer lift to mwc, assist for w/c propulsion, assist for shower transfer. ADLs Comments: PCA/spouse assist with all ADLs. Pt was able to complete self-feeding with L hand 2-3 weeks ago. PCA has noticed a gradual progressive decline in function. Uses tub bench for bathing.        OT Problem List: Decreased strength;Decreased range of motion;Decreased activity tolerance;Impaired balance (sitting and/or standing);Impaired vision/perception;Decreased coordination;Pain;Impaired tone;Impaired UE functional use      OT Treatment/Interventions:      OT Goals(Current goals can be found in the care plan section) Acute Rehab OT Goals Patient Stated Goal: improve R hand function, return to OPOT OT Goal Formulation: All assessment and education complete, DC therapy  OT Frequency:      Co-evaluation              AM-PAC OT "6 Clicks" Daily Activity     Outcome Measure Help from another person  eating meals?: A Lot Help from another person taking care of personal grooming?: Total Help from another person toileting, which includes using toliet, bedpan, or urinal?: Total Help from another person bathing (including washing, rinsing, drying)?: Total Help from another person to put on and taking off regular upper body clothing?: Total Help from another person  to put on and taking off regular lower body clothing?: Total 6 Click Score: 7   End of Session Nurse Communication: Mobility status  Activity Tolerance: Patient tolerated treatment well Patient left: in bed;with call bell/phone within reach;with bed alarm set;with family/visitor present  OT Visit Diagnosis: Low vision, both eyes (H54.2);Apraxia (R48.2);Feeding difficulties (R63.3);Pain;Muscle weakness (generalized) (M62.81);Other abnormalities of gait and mobility (R26.89);Hemiplegia and hemiparesis Hemiplegia - Right/Left: Left Hemiplegia - dominant/non-dominant: Non-Dominant Hemiplegia - caused by:  (MS) Pain - Right/Left:  (bilateral) Pain - part of body: Shoulder (neck)                Time: [id number] OT Time Calculation (min): 24 min Charges:  OT General Charges $OT Visit: 1 Visit OT Evaluation $OT Eval Moderate Complexity: 1 Mod  [facility] MS, OTR/L ascom [phone]  12/16/22, 11:49 AM

## 2022-12-16 NOTE — Assessment & Plan Note (Signed)
POCT strep, COVID, flu negative. Started her on Tussionex 5 mL at bedtime. Advised her to increase hydration and use humidifier.

## 2022-12-16 NOTE — Progress Notes (Signed)
PT Cancellation Note  Patient Details Name: Carly [name] MRN: [id number] DOB: 08/29/1951   Cancelled Treatment:    Reason Eval/Treat Not Completed: Other (comment).  PT consult received.  Chart reviewed.  Pt's caregiver present and reports pt has caregiver assist 8 am to 9 pm daily and otherwise has her husband present to assist as needed.  In terms of prior function, pt's caregiver reports pt requires significant assist for logrolling in bed; unable to sit on edge of bed d/t impaired sitting balance; uses hoyer lift to get to/from [facility] or w/c; caregiver pushes pt in manual w/c (pt also has power w/c).  Caregiver reports pt no longer receiving HHPT/OT but has book of ex's they were given to perform.  Pt reporting biggest concern (and change in function) is decreased ability to use UE's (to help pt eat) in last few weeks and wants to focus on OT during hospitalization (OT updated on pt's status).  No acute PT needs identified; will sign off d/t this (pt and pt's caregiver verbalizing agreement).  Leitha Bleak, PT 12/16/22, 8:51 AM

## 2022-12-16 NOTE — Progress Notes (Signed)
Progress Note   Patient: Carly [name] [id number] DOB: 1951/10/05 DOA: 12/14/2022     1 DOS: the patient was seen and examined on 12/16/2022    Subjective: Patient seen and examined at bedside this morning Appears lethargic and tearful Having weakness in both upper and lower extremities Currently deemed to be having multiple sclerosis flare in the setting of recent upper respiratory tract infection Patient would need steroid therapy intravenously for 5 days as recommended by neurologist   Brief hospital course: Carly [name] is a 72 y.o. female with medical history significant for multiple sclerosis who at baseline is mostly bedbound, hypertension, depression and anxiety, GERD and migraine presented to the emergency room with a 2-week history of weakness of the upper extremities and slurring of her speech as well as swelling of the right arm.  She also has had nasal congestion for the past 3 weeks but without cough, shortness of breath, fever or chills or chest pain. The emergency room vitals were within normal limits.  Respiratory viral panel negative.  Upper extremity venous Doppler negative for DVT MRI cervical spine consistent with history of demyelinating disease, spinal stenosis and foraminal stenosis MRI brain with and without contrast consistent with active demyelination, according to neurologist this is consistent with MS flare up in the setting of respiratory tract infection. Neurologist has recommended 5 days course of IV Solu-Medrol.   Assessment and Plan: Exacerbation of multiple sclerosis [facility]) Neurology recommends" 5 days of IV Solumedrol 1 gram to try to get her back to her baseline as well as rehab consultation. Continue IV Solu-Medrol 1 g daily x 5 days Neurology on board and case discussed PT OT also following   Respiratory tract infection Lozenges for sore throat Respiratory viral panel was negative Chest x-ray within normal  Swelling of right upper  extremity Negative venous Doppler right upper extremity   Lymphedema Stable   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HCC) Continue baclofen   OSA on CPAP CPAP if desired   DDD (degenerative disc disease), lumbosacral As needed pain meds   Hypertension Continue Cozaar and amlodipine   Depression, recurrent (HCC) Continue Zoloft, buspirone and Ativan     DVT prophylaxis: Lovenox   Consults: neurology, Dr Curly [place]   Advance Care Planning:   Code Status: Prior    Family Communication: Husband at bedside   Disposition Plan: Back to previous home environment       Physical Exam Vitals and nursing note reviewed.  Constitutional:      General: She is not in acute distress.    Comments: Bedbound female in no distress  HENT:     Head: Normocephalic and atraumatic.  Cardiovascular:     Rate and Rhythm: Normal rate and regular rhythm.     Heart sounds: Normal heart sounds.  Pulmonary:     Effort: Pulmonary effort is normal.     Breath sounds: Normal breath sounds.  Abdominal:     Palpations: Abdomen is soft.     Tenderness: There is no abdominal tenderness.  Musculoskeletal:     Comments: Mild swelling bilateral lower extremities and right upper extremity  Neurological:     Comments: Disuse atrophy lower extremities.  Patient alert and oriented     Vitals:   12/15/22 0800 12/15/22 2128 12/16/22 0458 12/16/22 0828  BP:  135/81 138/74 123/62  Pulse:  (!) 108 84 80  Resp:  '19 17 16  '$ Temp:  98.6 F (37 C) 98.1  F (36.7 C) 99.1 F (37.3 C)  TempSrc:  Oral Oral   SpO2:  94% 93% 95%  Weight: 87.5 kg     Height:        Data Reviewed: Laboratory results reviewed by me today   Family Communication: No family at bedside at this time   Disposition: Status is: Inpatient Patient still meets inpatient criteria as she needs to complete high-dose steroid therapy for minimum of 5 days    Planned Discharge Destination:  Pending clinical course, likely home with home health services    Time spent: 40 minutes    Author: Verline Lema, MD 12/16/2022 1:28 PM  For on call review [email or web address].

## 2022-12-17 ENCOUNTER — Inpatient Hospital Stay (HOSPITAL_COMMUNITY)
Admit: 2022-12-17 | Discharge: 2022-12-17 | Disposition: A | Discharge status: Home / Self Care | Payer: Medicare Other | Attending: Neurology | Admitting: Neurology

## 2022-12-17 ENCOUNTER — Inpatient Hospital Stay: Payer: Medicare Other

## 2022-12-17 DIAGNOSIS — I6389 Other cerebral infarction: Secondary | ICD-10-CM | POA: Diagnosis not present

## 2022-12-17 DIAGNOSIS — G35 Multiple sclerosis: Secondary | ICD-10-CM | POA: Diagnosis not present

## 2022-12-17 LAB — CBC WITH DIFFERENTIAL/PLATELET
Abs Immature Granulocytes: 0.02 10*3/uL (ref 0.00–0.07)
Basophils Absolute: 0 10*3/uL (ref 0.0–0.1)
Basophils Relative: 0 %
Eosinophils Absolute: 0 10*3/uL (ref 0.0–0.5)
Eosinophils Relative: 0 %
HCT: 38.6 % (ref 36.0–46.0)
Hemoglobin: 12.5 g/dL (ref 12.0–15.0)
Immature Granulocytes: 0 %
Lymphocytes Relative: 14 %
Lymphs Abs: 1.3 10*3/uL (ref 0.7–4.0)
MCH: 25.6 pg — ABNORMAL LOW (ref 26.0–34.0)
MCHC: 32.4 g/dL (ref 30.0–36.0)
MCV: 78.9 fL — ABNORMAL LOW (ref 80.0–100.0)
Monocytes Absolute: 0.5 10*3/uL (ref 0.1–1.0)
Monocytes Relative: 5 %
Neutro Abs: 7 10*3/uL (ref 1.7–7.7)
Neutrophils Relative %: 81 %
Platelets: 273 10*3/uL (ref 150–400)
RBC: 4.89 MIL/uL (ref 3.87–5.11)
RDW: 16.1 % — ABNORMAL HIGH (ref 11.5–15.5)
WBC: 8.8 10*3/uL (ref 4.0–10.5)
nRBC: 0 % (ref 0.0–0.2)

## 2022-12-17 LAB — MAGNESIUM: Magnesium: 2.2 mg/dL (ref 1.7–2.4)

## 2022-12-17 LAB — ECHOCARDIOGRAM COMPLETE
AR max vel: 2.55 cm2
AV Area VTI: 2.65 cm2
AV Area mean vel: 2.8 cm2
AV Mean grad: 2 mmHg
AV Peak grad: 5.1 mmHg
Ao pk vel: 1.13 m/s
Area-P 1/2: 3.89 cm2
Height: 63 in
MV VTI: 1.98 cm2
S' Lateral: 2.1 cm
Weight: 3088 oz

## 2022-12-17 LAB — BASIC METABOLIC PANEL
Anion gap: 10 (ref 5–15)
BUN: 19 mg/dL (ref 8–23)
CO2: 25 mmol/L (ref 22–32)
Calcium: 9.5 mg/dL (ref 8.9–10.3)
Chloride: 103 mmol/L (ref 98–111)
Creatinine, Ser: 0.6 mg/dL (ref 0.44–1.00)
GFR, Estimated: >60 mL/min (ref >60)
Glucose, Bld: 139 mg/dL — ABNORMAL HIGH (ref 70–99)
Potassium: 3.8 mmol/L (ref 3.5–5.1)
Sodium: 138 mmol/L (ref 135–145)

## 2022-12-17 MED ORDER — DIVALPROEX SODIUM ER 500 MG PO TB24
500.0000 mg | ORAL_TABLET | Freq: Every day | ORAL | Status: DC
  Administered 2022-12-17: 500 mg via ORAL
  Filled 2022-12-17: qty 1

## 2022-12-17 MED ORDER — POLYETHYLENE GLYCOL 3350 17 G PO PACK
17.0000 g | PACK | Freq: Two times a day (BID) | ORAL | Status: AC
  Administered 2022-12-17 (×2): 17 g via ORAL
  Filled 2022-12-17 (×2): qty 1

## 2022-12-17 MED ORDER — SENNOSIDES-DOCUSATE SODIUM 8.6-50 MG PO TABS
2.0000 | ORAL_TABLET | Freq: Two times a day (BID) | ORAL | Status: AC
  Administered 2022-12-17 (×2): 2 via ORAL
  Filled 2022-12-17 (×2): qty 2

## 2022-12-17 MED ORDER — STROKE: EARLY STAGES OF RECOVERY BOOK: Freq: Once | Status: AC

## 2022-12-17 MED ORDER — IOHEXOL 350 MG/ML SOLN
50.0000 mL | Freq: Once | INTRAVENOUS | Status: AC | PRN
  Administered 2022-12-17: 50 mL via INTRAVENOUS

## 2022-12-17 NOTE — Progress Notes (Signed)
PROGRESS NOTE    Carly [name]   [id number] DOB: 10/25/1950  DOA: 12/14/2022 Date of Service: 12/17/22 PCP: Einar Pheasant, MD     Brief Narrative / Hospital Course:  Carly [name] is a 72 y.o. female with medical history significant for multiple sclerosis who at baseline is mostly bedbound, hypertension, depression and anxiety, GERD and migraine presented to the emergency room with a 2-week history of weakness of the upper extremities and slurring of her speech as well as swelling of the right arm. She has been having a significant decline in her baseline functional status for the past 2 weeks, with caregiver noting that she used to be able to feed herself 2 weeks ago but now has been requiring hand feeding.  Caregiver notes that this has been a gradual progressive decline and not a sudden onset of decline. She also has had nasal congestion for the past 3 weeks but without cough, shortness of breath, fever or chills or chest pain.  03/04:  Respiratory viral panel negative.  Labs including CBC, CMP and troponin mostly unremarkable except for potassium of 3.4. Upper extremity venous Doppler negative for DVT. MRI cervical spine consistent with history of demyelinating disease, spinal stenosis and foraminal stenosis. MRI brain with and without contrast consistent with active demyelination, possible evolving subacute ischemic infarct difficult to exclude. Teleneurology recs: 5 days of IV Solu-Medrol 1 g, rehab consult. Hospitalist admitted patient for MS flare. Neurology following, also treating headache w/ depakote x1 dose.  03/05: stable, continuing steroids  03/06: completed 3 days steroids - on further history taken by Dr. Curly Shores, more sudden onset L arm weakness c/w CVA > MS, will get Echo and CTA H/N  Consultants:  Neurology  Procedures: none      ASSESSMENT & PLAN:   Principal Problem:   Exacerbation of multiple sclerosis ([place]) Active Problems:   Respiratory tract  infection   Depression, recurrent ([place])   Hypertension   DDD (degenerative disc disease), lumbosacral   OSA on CPAP   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[place])   Lymphedema   Swelling of right upper extremity  Exacerbation of multiple sclerosis (H[facility]) vs new subacute  lacunar vs. embolic CVA 5 days of IV Solumedrol 1 gram was initial plan, has completed 3 days --> now plan for d/c steroids per neurology and w/u for CVA rehab consultation Neurology following - see Dr. Lyn Records note for full details  [name] will arrange close outpatient follow-up to discuss further  Continue home Plavix 75 mg daily; will not add aspirin due to unclear timing of stroke and majority of the benefit in the first 7 days  Goal blood pressure normotension  Management of hypertriglyceridemia LDL meeting goal so will not change home rosuvastatin 5 more times daily  A1c meeting goal less than 7%  Will obtain CTA head and neck  Will obtain echocardiogram -Stroke order set placed for quality metrics   Headache management  Depakote for headache prophylaxis per neurology  Respiratory tract infection likely viral  Respiratory viral panel was negative Chest x-ray within normal [place] for sore throat Other symptomatic care as needed   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[place]) Continue baclofen  Hx CVA Continue Plavix    OSA on CPAP CPAP if desired   DDD (degenerative disc disease), lumbosacral As needed pain meds   Hypertension Continue Cozaar and amlodipine   Depression, recurrent ([place]) Continue Zoloft, buspirone  and Ativan  Swelling of right upper extremity Negative venous Doppler right upper extremity   Lymphedema Stable     DVT prophylaxis: lovenox  Pertinent IV fluids/nutrition: no continuous IV fluids  Central lines / invasive devices: none  Code Status: DNR, see IPAL note   Current  Admission Status: inpatient   TOC needs / Dispo plan: anticipate d/c to previous home environment and continue outpatient PT/OT Barriers to discharge / significant pending items: echo, carotid US, stable off steroids, neurology clearance prior to discharge              Subjective / Brief ROS:  Patient reports no concerns at this time  Denies CP/SOB.  Pain controlled.  Denies new weakness.  Tolerating diet.  Reports constipation   Family Communication: caregiver and husband at bedside on rounds     Objective Findings:  Vitals:   12/16/22 2026 12/17/22 0455 12/17/22 0723 12/17/22 1115  BP: (!) 145/75 128/72 128/69 112/70  Pulse: (!) 105 81 81 77  Resp: '19 20 14 19  '$ Temp: 98.3 F (36.8 C) 97.8 F (36.6 C) 98.2 F (36.8 C) 98.4 F (36.9 C)  TempSrc:   Oral   SpO2: 95% 94% 95% 95%  Weight:      Height:        Intake/Output Summary (Last 24 hours) at 12/17/2022 1300 Last data filed at 12/17/2022 [id number] Gross per 24 hour  Intake --  Output 1100 ml  Net -1100 ml   Filed Weights   12/15/22 0800  Weight: 87.5 kg    Examination:  Physical Exam Constitutional:      General: She is not in acute distress.    Appearance: Normal appearance.  Eyes:     Extraocular Movements: Extraocular movements intact.  Cardiovascular:     Rate and Rhythm: Normal rate and regular rhythm.  Pulmonary:     Effort: Pulmonary effort is normal.     Breath sounds: Normal breath sounds.  Abdominal:     General: Bowel sounds are normal.     Palpations: Abdomen is soft.  Skin:    General: Skin is warm and dry.  Neurological:     Mental Status: She is alert and oriented to person, place, and time.  Psychiatric:        Mood and Affect: Mood normal.        Behavior: Behavior normal.          Scheduled Medications:   [START ON 12/18/2022]  stroke: early stages of recovery book   Does not apply Once   amLODipine  5 mg Oral Daily   baclofen  10 mg Oral QID   buPROPion  150 mg Oral  Daily   busPIRone  15 mg Oral TID   clopidogrel  75 mg Oral Daily   dalfampridine  10 mg Oral BID   divalproex  500 mg Oral QHS   enoxaparin (LOVENOX) injection  0.5 mg/kg Subcutaneous Q24H   fluticasone  1 spray Each Nare Daily   gabapentin  100 mg Oral q morning   And   gabapentin  200 mg Oral BID   loratadine  10 mg Oral Daily   losartan  50 mg Oral Daily   melatonin  5 mg Oral Once   pantoprazole  40 mg Oral Daily   polyethylene glycol  17 g Oral BID   rosuvastatin  5 mg Oral Daily   saccharomyces boulardii  250 mg Oral BID   senna-docusate  2 tablet Oral BID  sertraline  150 mg Oral Daily   topiramate  25 mg Oral QHS    Continuous Infusions:    PRN Medications:  acetaminophen **OR** acetaminophen, chlorpheniramine-HYDROcodone, LORazepam, menthol-cetylpyridinium, morphine injection, ondansetron **OR** ondansetron (ZOFRAN) IV, mouth rinse, sodium chloride  Antimicrobials from admission:  Anti-infectives (From admission, onward)    None           Data Reviewed:  I have personally reviewed the following...  CBC: Recent Labs  Lab 12/14/22 1713 12/15/22 0517 12/17/22 0423  WBC 7.5 6.9 8.8  NEUTROABS  --   --  7.0  HGB 13.5 13.4 12.5  HCT 42.2 42.1 38.6  MCV 80.8 81.9 78.9*  PLT 290 269 [id number]   Basic Metabolic Panel: Recent Labs  Lab 12/14/22 1713 12/15/22 0517 12/16/22 0616 12/17/22 0423  NA 138  --  135 138  K 3.4*  --  3.6 3.8  CL 102  --  103 103  CO2 25  --  23 25  GLUCOSE 102*  --  130* 139*  BUN 14  --  14 19  CREATININE 0.80 0.64 0.67 0.60  CALCIUM 9.2  --  9.4 9.5  MG  --   --  2.1 2.2   GFR: Estimated Creatinine Clearance: 66.6 mL/min (by C-G formula based on SCr of 0.6 mg/dL). Liver Function Tests: Recent Labs  Lab 12/14/22 1713  AST 25  ALT 20  ALKPHOS 74  BILITOT 0.5  PROT 7.1  ALBUMIN 4.1   No results for input(s): "LIPASE", "AMYLASE" in the last 168 hours. No results for input(s): "AMMONIA" in the last 168  hours. Coagulation Profile: No results for input(s): "INR", "PROTIME" in the last 168 hours. Cardiac Enzymes: No results for input(s): "CKTOTAL", "CKMB", "CKMBINDEX", "TROPONINI" in the last 168 hours. BNP (last 3 results) No results for input(s): "PROBNP" in the last 8760 hours. HbA1C: No results for input(s): "HGBA1C" in the last 72 hours. CBG: No results for input(s): "GLUCAP" in the last 168 hours. Lipid Profile: No results for input(s): "CHOL", "HDL", "LDLCALC", "TRIG", "CHOLHDL", "LDLDIRECT" in the last 72 hours. Thyroid Function Tests: No results for input(s): "TSH", "T4TOTAL", "FREET4", "T3FREE", "THYROIDAB" in the last 72 hours. Anemia Panel: No results for input(s): "VITAMINB12", "FOLATE", "FERRITIN", "TIBC", "IRON", "RETICCTPCT" in the last 72 hours. Most Recent Urinalysis On File:     Component Value Date/Time   COLORURINE YELLOW (A) 09/13/2022 1544   APPEARANCEUR Clear 10/24/2022 1300   LABSPEC 1.016 09/13/2022 1544   PHURINE 7.0 09/13/2022 1544   GLUCOSEU Negative 10/24/2022 1300   GLUCOSEU NEGATIVE 08/16/2018 1633   HGBUR NEGATIVE 09/13/2022 1544   BILIRUBINUR Negative 10/24/2022 1300   KETONESUR NEGATIVE 09/13/2022 1544   PROTEINUR Negative 10/24/2022 1300   PROTEINUR NEGATIVE 09/13/2022 1544   UROBILINOGEN 0.2 08/16/2018 1633   NITRITE Negative 10/24/2022 1300   NITRITE NEGATIVE 09/13/2022 1544   LEUKOCYTESUR Negative 10/24/2022 1300   LEUKOCYTESUR NEGATIVE 09/13/2022 1544   Sepsis Labs: '@LABRCNTIP'$ (procalcitonin:4,lacticidven:4) Microbiology: Recent Results (from the past 240 hour(s))  Resp panel by RT-PCR (RSV, Flu A&B, Covid) Anterior Nasal Swab     Status: None   Collection Time: 12/14/22  5:13 PM   Specimen: Anterior Nasal Swab  Result Value Ref Range Status   SARS Coronavirus 2 by RT PCR NEGATIVE NEGATIVE Final    Comment: (NOTE) SARS-CoV-2 target nucleic acids are NOT DETECTED.  The SARS-CoV-2 RNA is generally detectable in upper  respiratory specimens during the acute phase of infection. The lowest concentration of SARS-CoV-2 viral copies  this assay can detect is 138 copies/mL. A negative result does not preclude SARS-Cov-2 infection and should not be used as the sole basis for treatment or other patient management decisions. A negative result may occur with  improper specimen collection/handling, submission of specimen other than nasopharyngeal swab, presence of viral mutation(s) within the areas targeted by this assay, and inadequate number of viral copies(<138 copies/mL). A negative result must be combined with clinical observations, patient history, and epidemiological information. The expected result is Negative.  Fact Sheet for Patients:  EntrepreneurPulse.com.au  Fact Sheet for Healthcare Providers:  IncredibleEmployment.be  This test is no t yet approved or cleared by the [place] FDA and  has been authorized for detection and/or diagnosis of SARS-CoV-2 by FDA under an Emergency Use Authorization (EUA). This EUA will remain  in effect (meaning this test can be used) for the duration of the COVID-19 declaration under Section 564(b)(1) of the Act, 21 U.S.C.section 360bbb-3(b)(1), unless the authorization is terminated  or revoked sooner.       Influenza A by PCR NEGATIVE NEGATIVE Final   Influenza B by PCR NEGATIVE NEGATIVE Final    Comment: (NOTE) The Xpert Xpress SARS-CoV-2/FLU/RSV plus assay is intended as an aid in the diagnosis of influenza from Nasopharyngeal swab specimens and should not be used as a sole basis for treatment. Nasal washings and aspirates are unacceptable for Xpert Xpress SARS-CoV-2/FLU/RSV testing.  Fact Sheet for Patients: EntrepreneurPulse.com.au  Fact Sheet for Healthcare Providers: IncredibleEmployment.be  This test is not yet approved or cleared by the [place] FDA and has been  authorized for detection and/or diagnosis of SARS-CoV-2 by FDA under an Emergency Use Authorization (EUA). This EUA will remain in effect (meaning this test can be used) for the duration of the COVID-19 declaration under Section 564(b)(1) of the Act, 21 U.S.C. section 360bbb-3(b)(1), unless the authorization is terminated or revoked.     Resp Syncytial Virus by PCR NEGATIVE NEGATIVE Final    Comment: (NOTE) Fact Sheet for Patients: EntrepreneurPulse.com.au  Fact Sheet for Healthcare Providers: IncredibleEmployment.be  This test is not yet approved or cleared by the [place] FDA and has been authorized for detection and/or diagnosis of SARS-CoV-2 by FDA under an Emergency Use Authorization (EUA). This EUA will remain in effect (meaning this test can be used) for the duration of the COVID-19 declaration under Section 564(b)(1) of the Act, 21 U.S.C. section 360bbb-3(b)(1), unless the authorization is terminated or revoked.  Performed at [facility], [address] [place],  [address]       Radiology Studies last 3 days: [facility] Chest [place] 1 View  Result Date: 12/15/2022 CLINICAL DATA:  72 year old female with history of cough. Left-sided weakness. EXAM: PORTABLE CHEST 1 VIEW COMPARISON:  Chest x-ray 11/12/2022. FINDINGS: Lung volumes are normal. No consolidative airspace disease. No pleural effusions. No pneumothorax. No pulmonary nodule or mass noted. No evidence of pulmonary edema. Heart size is normal. Upper mediastinal contours are distorted by patient's rotation to the left. Atherosclerotic calcifications are noted in the thoracic aorta. Dextroscoliosis of the thoracic spine. IMPRESSION: 1. No radiographic evidence of acute cardiopulmonary disease. 2. Aortic atherosclerosis. Electronically Signed   By: Vinnie Langton M.D.   On: 12/15/2022 05:18   MR CERVICAL SPINE W WO CONTRAST  Result Date: 12/14/2022 CLINICAL DATA:   Initial evaluation for multiple sclerosis, left-sided weakness. EXAM: MRI CERVICAL SPINE WITHOUT AND WITH CONTRAST TECHNIQUE: Multiplanar and multiecho pulse sequences of the cervical spine, to include the craniocervical junction and  cervicothoracic junction, were obtained without and with intravenous contrast. CONTRAST:  74m GADAVIST GADOBUTROL 1 MMOL/ML IV SOLN COMPARISON:  Prior radiograph from 11/24/2022 and MRI from 04/02/2018. FINDINGS: Alignment: Reversal of the normal cervical lordosis. Trace degenerative anterolisthesis of C3 on C4 and C4 on C5. Vertebrae: Vertebral body height maintained without acute or chronic fracture. Bone marrow signal intensity within normal limits. No worrisome osseous lesions. No abnormal marrow edema or enhancement. Cord: Patchy signal abnormality seen involving the central/dorsal cord at the level of C2 (series 8, image 1). Hazy signal abnormality involves the right dorsal cord at the level of C2-3 (series 8, image 5). Hazy and patchy signal abnormality seen involving the central and dorsal cord at C3-4 (series 8, image 9). Patchy signal abnormality involves the right cord at the level of C4 (series 8, image 11). Patchy signal abnormality involves the left greater than right cord at the level of C5-6 (series 8, image 17) patchy signal abnormality seen involving the bilateral cord at the level of C7 (series 8, image 24). Findings consistent with history of demyelinating disease/multiple sclerosis. No abnormal enhancement to suggest active demyelination. Slight focal cord atrophy noted at the level of C3-4. Posterior Fossa, vertebral arteries, paraspinal tissues: Paraspinous soft tissues demonstrate no acute or significant finding. Normal flow voids seen within the vertebral arteries bilaterally. Disc levels: C2-C3: Small central disc protrusion minimally indents the ventral thecal sac. Mild left-sided facet hypertrophy. No canal or foraminal stenosis. C3-C4: Mild disc bulge with  uncovertebral spurring. No significant spinal stenosis. Mild to moderate right C4 foraminal narrowing. Left neural foramina remains patent. C4-C5: Anterolisthesis. Mild disc bulge with uncovertebral spurring. Flattening of the ventral thecal sac with resultant mild spinal stenosis. Foramina remain patent. C5-C6: Degenerative vertebral disc space narrowing with diffuse disc osteophyte complex. Broad posterior component flattens and partially faces the ventral thecal sac. Mild cord flattening with mild spinal stenosis. Mild right with mild-to-moderate left C6 foraminal narrowing. C6-C7: Degenerative intervertebral disc space narrowing with diffuse disc osteophyte complex. Flattening and partial effacement of the ventral thecal sac. Moderate spinal stenosis. Moderate bilateral C7 foraminal narrowing. C7-T1: Negative interspace. Mild facet hypertrophy. No canal or foraminal stenosis. IMPRESSION: 1. Patchy multifocal signal abnormality involving the cervical spinal cord as above, consistent with history of demyelinating disease/multiple sclerosis. No evidence for active demyelination. 2. Multilevel cervical spondylosis with resultant mild to moderate diffuse spinal stenosis at C4-5 through C6-7. Associated mild to moderate right C4 and left C6 foraminal narrowing, with moderate bilateral C7 foraminal stenosis. Electronically Signed   By: BJeannine BogaM.D.   On: 12/14/2022 22:00   MR Brain W and Wo Contrast  Result Date: 12/14/2022 CLINICAL DATA:  Initial evaluation for multiple sclerosis, left-sided weakness. EXAM: MRI HEAD WITHOUT AND WITH CONTRAST TECHNIQUE: Multiplanar, multiecho pulse sequences of the brain and surrounding structures were obtained without and with intravenous contrast. CONTRAST:  983mGADAVIST GADOBUTROL 1 MMOL/ML IV SOLN COMPARISON:  Prior MRI from 03/28/2022. FINDINGS: Brain: Mildly advanced cerebral atrophy. Again seen is extensive patchy and confluent T2/FLAIR signal abnormality  throughout the periventricular, deep, and juxta cortical white matter as well as the pons. Findings presumably in large part related to history of multiple sclerosis. A degree of chronic microvascular ischemic disease could be contributory. Multiple scattered corresponding T1 black holes. Patchy restricted diffusion involving the right thalamic capsular region measuring 1.2 cm (series 9, image 25). Area demonstrates irregular nodular postcontrast enhancement (series 18, image 85). Finding favored to reflect a focus of active demyelination, although  an evolving subacute ischemic infarct is difficult to exclude. Few additional subtle nodular foci of enhancement at the left frontal corona radiata (series 18, image 103) as well as the central pons (series 18, image 39), likely small foci of active demyelination. No other evidence for active demyelination. No acute or subacute ischemia elsewhere within the brain. Gray-white matter differentiation otherwise maintained. No acute intracranial hemorrhage. Scattered chronic micro hemorrhages noted about both cerebral hemispheres and cerebellum, likely hypertensive in nature. No mass lesion, midline shift or mass effect. Ventricular prominence related to global parenchymal volume loss without hydrocephalus, stable. Partially empty sella noted. Vascular: Major intracranial vascular flow voids are maintained. Skull and upper cervical spine: Craniocervical junction within normal limits. Bone marrow signal intensity normal. No scalp soft tissue abnormality. Sinuses/Orbits: Globes and orbital soft tissues within normal limits. Paranasal sinuses are largely clear. Trace left mastoid effusion, of doubtful significance. Other: None. IMPRESSION: 1. 1.2 cm focus of restricted diffusion involving the right thalamocapsular region with irregular nodular postcontrast enhancement. Given the somewhat irregular enhancement pattern, finding favored to reflect a small focus of active  demyelination. Possible evolving subacute ischemic infarct is difficult to exclude, and could be considered in the correct clinical setting. 2. Few additional subtle nodular foci of enhancement at the left frontal corona radiata and central pons, likely additional small foci of active demyelination. 3. Underlying severe cerebral white matter disease, presumably related to history of multiple sclerosis, although a degree of chronic microvascular ischemic disease may be contributory. Electronically Signed   By: Jeannine Boga M.D.   On: 12/14/2022 21:51   US Venous Img Upper Uni Right(DVT)  Result Date: 12/14/2022 CLINICAL DATA:  Swelling EXAM: Right UPPER EXTREMITY VENOUS DOPPLER ULTRASOUND TECHNIQUE: Gray-scale sonography with graded compression, as well as color Doppler and duplex ultrasound were performed to evaluate the upper extremity deep venous system from the level of the subclavian vein and including the jugular, axillary, basilic, radial, ulnar and upper cephalic vein. Spectral Doppler was utilized to evaluate flow at rest and with distal augmentation maneuvers. COMPARISON:  None Available. FINDINGS: Internal Jugular Vein: No evidence of thrombus. Normal compressibility, respiratory phasicity and response to augmentation. Subclavian Vein: No evidence of thrombus. Normal compressibility, respiratory phasicity and response to augmentation. Axillary Vein: No evidence of thrombus. Normal compressibility, respiratory phasicity and response to augmentation. Cephalic Vein: No evidence of thrombus. Normal compressibility, respiratory phasicity and response to augmentation. Basilic Vein: No evidence of thrombus. Normal compressibility, respiratory phasicity and response to augmentation. Brachial Veins: No evidence of thrombus. Normal compressibility, respiratory phasicity and response to augmentation. Radial Veins: No evidence of thrombus. Normal compressibility, respiratory phasicity and response to  augmentation. Ulnar Veins: No evidence of thrombus. Normal compressibility, respiratory phasicity and response to augmentation. Venous Reflux:  None visualized. Other Findings:  None visualized. IMPRESSION: No evidence of DVT within the right upper extremity. Electronically Signed   By: Valetta Mole M.D.   On: 12/14/2022 17:58             LOS: 2 days       Emeterio Reeve, DO [facility] 12/17/2022, 1:00 PM    Dictation software may have been used to generate the above note. Typos may occur and escape review in typed/dictated notes. Please contact Dr Sheppard Coil directly for clarity if needed.  Staff may message me via secure chat in [place]  but this may not receive an immediate response,  please page me for urgent matters!  If 7PM-7AM, please contact night coverage [email or web address]

## 2022-12-17 NOTE — Hospital Course (Addendum)
Carly [name] is a 72 y.o. female with medical history significant for multiple sclerosis who at baseline is mostly bedbound, hypertension, depression and anxiety, GERD and migraine presented to the emergency room with a 2-week history of weakness of the upper extremities and slurring of her speech as well as swelling of the right arm. She has been having a significant decline in her baseline functional status for the past 2 weeks, with caregiver noting that she used to be able to feed herself 2 weeks ago but now has been requiring hand feeding.  Caregiver notes that this has been a gradual progressive decline and not a sudden onset of decline. She also has had nasal congestion for the past 3 weeks but without cough, shortness of breath, fever or chills or chest pain.  03/04:  Respiratory viral panel negative.  Labs including CBC, CMP and troponin mostly unremarkable except for potassium of 3.4. Upper extremity venous Doppler negative for DVT. MRI cervical spine consistent with history of demyelinating disease, spinal stenosis and foraminal stenosis. MRI brain with and without contrast consistent with active demyelination, possible evolving subacute ischemic infarct difficult to exclude. Teleneurology recs: 5 days of IV Solu-Medrol 1 g, rehab consult. Hospitalist admitted patient for MS flare. Neurology following, also treating headache w/ depakote x1 dose.  03/05: stable, continuing steroids  03/06: completed 3 days steroids - on further history taken by Dr. Curly Shores, more sudden onset L arm weakness c/w CVA > MS, will get Echo and CTA H/N 03/07: CTA H/N as below, Echo no concerns. Changing plavix to ticagrelor. Stop topamax, conitnue depakote.   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 embolic CVA 5 days of IV Solumedrol 1 gram was initial plan, has completed 3 days --> d/c steroids per neurology and w/u for CVA as below rehab consultation Neurology following - see Dr. Lyn Records notes for full details  [name] will arrange close outpatient follow-up to discuss further  Goal blood pressure normotension  Management of hypertriglyceridemia LDL meeting goal so will not change home rosuvastatin 5 times daily  A1c meeting goal less than 7%  Will obtain CTA head and neck --> possible atherosclerotic disease contributing to CVA Change Plavix to ticagrelor  Will obtain echocardiogram --> no concerns   Headache management  Depakote for headache prophylaxis per neurology Stop topamax   Respiratory tract infection likely viral  Respiratory viral panel was negative Chest x-ray within normal [place]es for sore throat Other symptomatic care as needed   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[place]) Continue baclofen  Hx CVA Plavix --> ticagrelor    OSA on CPAP CPAP if desired   DDD (degenerative disc disease), lumbosacral As needed pain meds   Hypertension Continue Cozaar and amlodipine   Depression, recurrent (HCC) Continue Zoloft, buspirone and Ativan  Swelling of right upper extremity Negative venous Doppler right upper extremity   Lymphedema Stable     DVT prophylaxis: lovenox  Pertinent IV fluids/nutrition: no continuous IV fluids  Central  lines / invasive devices: none  Code Status: DNR, see IPAL note   Current Admission Status: inpatient   TOC needs / Dispo plan: anticipate d/c to previous home environment and continue outpatient PT/OT Barriers to  discharge / significant pending items: echo, carotid US, stable off steroids, neurology clearance prior to discharge

## 2022-12-17 NOTE — Progress Notes (Signed)
*  PRELIMINARY RESULTS* Echocardiogram 2D Echocardiogram has been performed.  Carly [name] 12/17/2022, 3:44 PM

## 2022-12-17 NOTE — Evaluation (Addendum)
Clinical/Bedside Swallow Evaluation Patient Details  Name: Carly [name] MRN: [id number] Date of Birth: Sep 22, 1951  Today's Date: 12/17/2022 Time: SLP Start Time (ACUTE ONLY): [id number] SLP Stop Time (ACUTE ONLY): 1455 SLP Time Calculation (min) (ACUTE ONLY): 50 min  Past Medical History:  Past Medical History:  Diagnosis Date   Allergy    Anxiety    Aspiration pneumonia ([place])    Depression    Frequent headaches    H/O   GERD (gastroesophageal reflux disease)    History of chicken pox    History of colon polyps    Hx of migraines    Hypertension    Lymphedema    Multiple sclerosis ([place]) 2011   OSA (obstructive sleep apnea)    PONV (postoperative nausea and vomiting)    Scoliosis    Past Surgical History:  Past Surgical History:  Procedure Laterality Date   BACK SURGERY     CHOLECYSTECTOMY     COLONOSCOPY N/A 04/23/2022   Procedure: COLONOSCOPY;  Surgeon: Toledo, Benay Pike, MD;  Location: [facility] ENDOSCOPY;  Service: Gastroenterology;  Laterality: N/A;   COLONOSCOPY WITH PROPOFOL N/A 05/18/2017   Procedure: COLONOSCOPY WITH PROPOFOL;  Surgeon: [name], MD;  Location: [facility] ENDOSCOPY;  Service: Endoscopy;  Laterality: N/A;   FOOT SURGERY  2015   GALLBLADDER SURGERY  2008   HARDWARE REMOVAL Left 02/14/2016   Procedure: LEFT FOOT REMOVAL DEEP IMPLANT;  Surgeon: [name], MD;  Location: [place];  Service: Orthopedics;  Laterality: Left;   HERNIA REPAIR     Inguinal Hernia Repair   SPINE SURGERY  2014   HPI:  Pt is a 72 y.o. female with medical history significant for multiple sclerosis who at baseline is mostly bedbound and requires assistance from Caregivers for all ADLs, Obesity, hypertension, depression and anxiety, GERD and migraine presented to the emergency room with a 2-week history of weakness of the upper extremities and slurring of her speech as well as swelling of the right arm.  She also has had nasal congestion for the past 3 weeks but  without cough, shortness of breath, fever or chills or chest pain.  Prior to admit, famkily c/o left-sided weakness that began weeks ago. Right-sided weakness also noted which began post previous stroke ~2 years ago. Pt c/o neck pain, left arm swelling x 2 weeks, right arm swelling x 1 month. Post admit, a Code Stroke was called d/t slurred speech and bilateral upper extremity weakness.   MRI revealed: 1.2 cm focus of restricted diffusion involving the right  thalamocapsular region with irregular nodular postcontrast  enhancement. Given the somewhat irregular enhancement pattern,  finding favored to reflect a small focus of active demyelination.  Possible evolving subacute ischemic infarct is difficult to exclude,  and could be considered in the correct clinical setting.  2. Few additional subtle nodular foci of enhancement at the left  frontal corona radiata and central pons, likely additional small  foci of active demyelination.  3. Underlying Severe cerebral white matter disease, presumably  related to history of multiple sclerosis, although a degree of  chronic microvascular ischemic disease may be contributory.  CXR post admit: No radiographic evidence of acute cardiopulmonary disease.  Pt has been on a regular diet w/ thins since admit per MD order.  Noted Neurology notes revealing "Caregiver notes that this has been a gradual progressive decline and not a sudden onset of decline."; also, "Impression: Multiple sclerosis flare in the setting of recent upper respiratory tract infection.  Given the gradually progressive symptoms, story is not consistent with infarction.". Neurology Impression: "Possible MS flare; Possible lacunar vs. embolic stroke, subacute.".     Assessment / Plan / Recommendation  Clinical Impression  Pt seen today for BSE. Pt awake, resting in bed. Caregiver and Husband present. Pt verbal w/ intermittent responses to basic questions/tasks re: self. Pt has a Baseline dx of MS and  Cognitive decline -- see chart notes in 2022. Pt requires much support w/ ADLs from Caregivers in the home but can hold cup to drink -- she uses a personal drink cup w/ spout ~90% of the time and does well drinking from such w/out deficits noted by Family/Caregivers. Pt has not presented to the hospital for CXR imaging w/ dx's pneumonia nor a MDs office for tx of pneumonia in several years prior to this year.  On RA, afebrile. WBC WNL.  Pt appears to present w/ functional oropharyngeal phase swallowing w/ No grossly, overt oropharyngeal phase dysphagia noted, No neuromuscular deficits impacting swallowing noted. Pt consumed po trials w/ No immediate, overt clinical s/s of aspiration during po trials. Pt requires support w/ eating/drinking in setting of dx's MS and Cognitive decline but appeared to manage bolus consistencies appropriately. ANY Cognitive decline or neuromuscular decline can impact her overall awareness/timing of swallow and safety during po tasks, which increases risk for aspiration, choking.  Pt's risk for aspiration can be reduced when following general aspiration precautions and using a slightly modified diet consistency of cut/moistened foods, and when given feeding support BUT allowing pt to hold her own drink cup when drinking AND to use her own/known drink cup for consistency.    Pt appears to be at reduced risk for aspiration/aspiration pneumonia when following general aspiration precautions, strategies. However, pt does have challenging factors that could impact her oropharyngeal swallowing to include discomfort(NSG aware - pt receives morphine per chart), fatigue/weakness, dx of MS, dependency for feeding, and current hospitalization. UE movements and self-feeding abilities are limited also but pt is able to hold own personal drink cup when drinking. These factors can increase risk for aspiration, dysphagia as well as decreased oral intake overall.   During po trials, pt consumed all  consistencies w/ no overt coughing, decline in vocal quality, or change in respiratory presentation during/post trials. O2 sats remained 98%. Oral phase appeared [facility] w/ timely bolus management, mastication, and control of bolus propulsion for A-P transfer for swallowing. Oral clearing achieved w/ all trial consistencies -- moistened, soft foods given. Rest breaks given intermittently to slow pt down and avoid any SOB/WOB; conservation of energy. TV turned off to reduce distractions. Positioning fully upright given. OM Exam appeared [facility] w/ no unilateral weakness noted. Speech Clear, min low volume. Pt held own drink cup w/ support for thin liquid trials -- a known drink cup for consistency. This helps to improve safety w/ swallowing/intake.   Recommend continue a fairly Regular consistency diet w/ well-Cut meats, moistened foods; Thin liquids -- use of pt's own/known drink cup for all drinks for consistency. Monitor any other cup/straw use, and pt should help to Hold Cup when drinking. Recommend general aspiration precautions, reduce distractions during meals. Ensure full mastication and oral clearing w/ bites. Rest Breaks during po's to avoid any SOB/WOB; conservation of energy. Pills WHOLE vs Crushed in Puree for safer, easier swallowing -- encouraged now and for D/C to the Caregiver.  Education given on Pills in Puree; food consistencies and easy to eat options; general aspiration precautions and strategies to  pt and Caregiver who agreed. NSG updated on supportive strategies.  MD to reconsult if any new needs arise while admitted. Recommend Dietician f/u for support.  Discussion points/Education w/ Caregiver/husband: 1. buy 2-3 more of the SAME drink cup and put ALL of her beverages in them for consistency 2. pills in Puree moving forward. 3. general aspiration precautions and sitting fully upright w/ head forward (out of bed best) for all oral intake. 4. Rest breaks for conservation of energy secondary to  the MS. SLP Visit Diagnosis: Dysphagia, unspecified (R13.10) (baseline dx of MS; dependent for care/feeding but can hold own cup w/ support)    Aspiration Risk  Mild aspiration risk;Risk for inadequate nutrition/hydration (reduced following precautions)    Diet Recommendation   a fairly Regular consistency diet w/ well-Cut meats, moistened foods; Thin liquids -- use of pt's own/known drink cup for all drinks for consistency. Monitor any other cup/straw use, and pt should help to Hold Cup when drinking. Recommend general aspiration precautions, reduce distractions during meals. Ensure full mastication and oral clearing w/ bites. Rest Breaks during po's to avoid any SOB/WOB.   Medication Administration: Whole meds with puree (for safer swallowing, vs need to Crush in puree)    Other  Recommendations Recommended Consults:  (Dietician f/u) Oral Care Recommendations: Oral care BID;Oral care before and after PO;Staff/trained caregiver to provide oral care    Recommendations for follow up therapy are one component of a multi-disciplinary discharge planning process, led by the attending physician.  Recommendations may be updated based on patient status, additional functional criteria and insurance authorization.  Follow up Recommendations No SLP follow up      Assistance Recommended at Discharge  Full support baseline  Functional Status Assessment Patient has had a recent decline in their functional status and demonstrates the ability to make significant improvements in function in a reasonable and predictable amount of time.  Frequency and Duration  (n/a)   (n/a)       Prognosis Prognosis for improved oropharyngeal function: Fair (-Good) Barriers to Reach Goals: Time post onset;Severity of deficits;Cognitive deficits Barriers/Prognosis Comment: dx of MS; deconditioned status; dependent for ADLs      Swallow Study   General Date of Onset: 12/14/22 HPI: Pt is a 72 y.o. female with medical  history significant for multiple sclerosis who at baseline is mostly bedbound and requires assistance from Caregivers for all ADLs, Obesity, hypertension, depression and anxiety, GERD and migraine presented to the emergency room with a 2-week history of weakness of the upper extremities and slurring of her speech as well as swelling of the right arm.  She also has had nasal congestion for the past 3 weeks but without cough, shortness of breath, fever or chills or chest pain.  Prior to admit, famkily c/o left-sided weakness that began weeks ago. Right-sided weakness also noted which began post previous stroke ~2 years ago. Pt c/o neck pain, left arm swelling x 2 weeks, right arm swelling x 1 month. Post admit, a Code Stroke was called d/t slurred speech and bilateral upper extremity weakness.   MRI revealed: 1.2 cm focus of restricted diffusion involving the right  thalamocapsular region with irregular nodular postcontrast  enhancement. Given the somewhat irregular enhancement pattern,  finding favored to reflect a small focus of active demyelination.  Possible evolving subacute ischemic infarct is difficult to exclude,  and could be considered in the correct clinical setting.  2. Few additional subtle nodular foci of enhancement at the left  frontal corona  radiata and central pons, likely additional small  foci of active demyelination.  3. Underlying Severe cerebral white matter disease, presumably  related to history of multiple sclerosis, although a degree of  chronic microvascular ischemic disease may be contributory.  CXR post admit: No radiographic evidence of acute cardiopulmonary disease.  Pt has been on a regular diet w/ thins since admit per MD order. Type of Study: Bedside Swallow Evaluation Previous Swallow Assessment: none for swallowing. Noted Cognitive impairments in 2022 w/ therapy following then; D/C w/ min-supervision A for cognitive skills/tasks then. Diet Prior to this Study: Regular;Thin  liquids (Level 0) (holding po's pending BSE today d/t coughing w/ thin liquids w/ NSG today using a styrofoam cup to drink from vs pt's own drink cup) Temperature Spikes Noted: No (wbc 8.8) Respiratory Status: Room air History of Recent Intubation: No Behavior/Cognition: Alert;Cooperative;Pleasant mood;Confused;Distractible;Requires cueing (baseline deficits per chart notes in 2022) Oral Cavity Assessment: Within Functional Limits Oral Care Completed by SLP: Recent completion by staff Oral Cavity - Dentition: Adequate natural dentition Vision: Functional for self-feeding Self-Feeding Abilities: Able to feed self;Needs assist;Needs set up;Total assist (can hold cup to drink) Patient Positioning: Upright in bed (needed full positioning support) Baseline Vocal Quality: Normal;Low vocal intensity Volitional Cough: Strong Volitional Swallow: Able to elicit    Oral/Motor/Sensory Function Overall Oral Motor/Sensory Function: Within functional limits   Ice Chips Ice chips: Within functional limits Presentation: Spoon (fed; 2 trials)   Thin Liquid Thin Liquid: Within functional limits Presentation: Self Fed;Straw (cup spout - her own drink cup; 15+ sips) Other Comments: several ozs    Nectar Thick Nectar Thick Liquid: Not tested   Honey Thick Honey Thick Liquid: Not tested   Puree Puree: Within functional limits Presentation: Spoon (fed; 8+ trials)   Solid     Solid: Within functional limits Presentation: Spoon (fed; 8 trials) Other Comments: alternated foods        Orinda Kenner, MS, CCC-SLP Speech Language Pathologist Rehab Services; [place] [phone] (ascom) Ryane Canavan 12/17/2022,3:25 PM

## 2022-12-17 NOTE — Progress Notes (Signed)
Neurology Progress Note   Subjective: -Has been getting intermittent morphine for back pain -Feeling better today overall   Exam: Current vital signs: BP 112/70 (BP Location: Left Arm)   Pulse 77   Temp 98.4 F (36.9 C)   Resp 19   Ht '5\' 3"'$  (1.6 m)   Wt 87.5 kg   SpO2 95%   BMI 34.19 kg/m  Vital signs in last 24 hours: Temp:  [97.8 F (36.6 C)-98.4 F (36.9 C)] 98.4 F (36.9 C) (03/06 1115) Pulse Rate:  [77-105] 77 (03/06 1115) Resp:  [14-20] 19 (03/06 1115) BP: (112-145)/(67-75) 112/70 (03/06 1115) SpO2:  [93 %-95 %] 95 % (03/06 1115)   Gen: In bed, comfortable  Psych: Affect brighter today in presence of husband Resp: non-labored breathing, no grossly audible wheezing Cardiac: Perfusing extremities well  Abd: soft, nt  Neuro: MS: Slow speech but fluent, states number of years married incorrectly but closer than prior, identifies husband and caregiver at bedside,  CN: Saccadic intrusions, PERR, face symmetric, tongue midline Motor: 4-/5 BUE today, distally left hand is stronger than the right hand. BLE 2/5 proximally, knee extension strong, does not flex knees to command, feet remain strongly plantarflexed. Increased tone in BLE particularly   Sensory: Intact to light touch throughout  Pertinent Labs:  Basic Metabolic Panel: Recent Labs  Lab 12/14/22 1713 12/15/22 0517 12/16/22 0616 12/17/22 0423  NA 138  --  135 138  K 3.4*  --  3.6 3.8  CL 102  --  103 103  CO2 25  --  23 25  GLUCOSE 102*  --  130* 139*  BUN 14  --  14 19  CREATININE 0.80 0.64 0.67 0.60  CALCIUM 9.2  --  9.4 9.5  MG  --   --  2.1 2.2    CBC: Recent Labs  Lab 12/14/22 1713 12/15/22 0517 12/17/22 0423  WBC 7.5 6.9 8.8  NEUTROABS  --   --  7.0  HGB 13.5 13.4 12.5  HCT 42.2 42.1 38.6  MCV 80.8 81.9 78.9*  PLT 290 269 273    Coagulation Studies: No results for input(s): "LABPROT", "INR" in the last 72 hours.   Lab Results  Component Value Date   HGBA1C 5.8 (H) 03/07/2022    Lab Results  Component Value Date   CHOL 154 03/07/2022   HDL 69 03/07/2022   LDLCALC 58 03/07/2022   TRIG 160 (H) 03/07/2022   CHOLHDL 2.2 03/07/2022    Impression:  Appreciate discussion with [name] today, her outpatient neurologist who knows her very well.  We discussed that her clinical course has been very consistent with primary progressive MS in the years he has been following her with also significant concern for comorbid microvascular disease including small vessel strokes.   I had reached out to him because at her age with a diagnosis of primary progressive MS contrast-enhancing lesions would be very atypical.  Therefore, agree with his assessment that the most convincing lesion we have been treating is more likely to be a subacute stroke, with the 2 other small lesions potentially artifactual, especially after additional history provided by family at bedside that LUE weakness was sudden in onset 3 weeks ago   Additionally, she has completed 3 days of steroids which is sufficient for treating an possible acute MS flare (treatment time and standard of care varies from 3 to 5 days)  Will complete stroke workup and plan for close outpatient follow up with neurologist [name]  Recommendations:  # Possible MS flare -Stop methylprednisolone, completed 3 days of treatment 3/4 - 3/6 of 1 g daily -[name] will arrange close outpatient follow-up to discuss further, discussed with family DMARD risk/benefit profile unlikely to be in favor of treatment at her age and stage of MS  # Possible lacunar vs. embolic stroke, subacute  -Continue home Plavix 75 mg daily; will not add aspirin due to unclear timing of stroke and majority of the benefit in the first 7 days -Goal blood pressure normotension -Management of hypertriglyceridemia per primary team/PCP -LDL meeting goal so will not change home rosuvastatin 5 more times daily -A1c meeting goal less than 7% -Will obtain CTA head and  neck -Will obtain echocardiogram -Stroke order set placed for quality metrics  #Headache management -Depakote for headache prophylaxis, 500 mg QHS  Lesleigh Noe MD-PhD [facility] [phone]   Greater than 50 min spent in care, > 50% at bedside. D/w primary team and family at bedside

## 2022-12-18 ENCOUNTER — Telehealth: Payer: Self-pay

## 2022-12-18 ENCOUNTER — Telehealth: Payer: Self-pay | Admitting: Internal Medicine

## 2022-12-18 DIAGNOSIS — G35 Multiple sclerosis: Secondary | ICD-10-CM | POA: Diagnosis not present

## 2022-12-18 MED ORDER — DIVALPROEX SODIUM ER 500 MG PO TB24: 500.0000 mg | ORAL_TABLET | Freq: Every day | ORAL | 0 refills | Status: DC

## 2022-12-18 MED ORDER — TICAGRELOR 90 MG PO TABS: 90.0000 mg | ORAL_TABLET | Freq: Two times a day (BID) | ORAL | 0 refills | Status: DC

## 2022-12-18 MED ORDER — TICAGRELOR 90 MG PO TABS
180.0000 mg | ORAL_TABLET | Freq: Once | ORAL | Status: AC
  Administered 2022-12-18: 180 mg via ORAL
  Filled 2022-12-18: qty 2

## 2022-12-18 MED ORDER — LACTULOSE 10 GM/15ML PO SOLN
20.0000 g | Freq: Every day | ORAL | Status: DC | PRN
  Administered 2022-12-18: 20 g via ORAL
  Filled 2022-12-18: qty 30

## 2022-12-18 MED ORDER — TICAGRELOR 90 MG PO TABS: 90.0000 mg | ORAL_TABLET | Freq: Two times a day (BID) | ORAL | Status: DC

## 2022-12-18 NOTE — Plan of Care (Signed)
Neurology Progress Note  Subjective: Husband reports she has been very anxious today, he left only briefly and she has already been calling for him to return to the hospital She is much more anxious this morning, reporting sinus headache, requesting flonase  Exam: Current vital signs: BP (!) 143/80 (BP Location: Left Arm)   Pulse 92   Temp 98 F (36.7 C)   Resp 16   Ht '5\' 3"'$  (1.6 m)   Wt 87.5 kg   SpO2 96%   BMI 34.19 kg/m  Vital signs in last 24 hours: Temp:  [97.6 F (36.4 C)-98.4 F (36.9 C)] 98 F (36.7 C) (03/07 0800) Pulse Rate:  [76-98] 92 (03/07 0800) Resp:  [16-20] 16 (03/07 0800) BP: (112-143)/(57-81) 143/80 (03/07 0800) SpO2:  [91 %-97 %] 96 % (03/07 0800)   Gen: In bed, comfortable  Resp: non-labored breathing, no grossly audible wheezing Cardiac: Perfusing extremities well  Abd: soft, nt  Neuro: MS: Awake, alert, follows simple commands CN: PERRL, saccadic intrusions, face symmetric, tongue midline Motor: 4/5 BUE with right hand weakness > left hand weakness. 1-2/5 BLE left leg weaker than right Sensory: Grossly intact in all 4   Pertinent Data:   Basic Metabolic Panel: Recent Labs  Lab 12/14/22 1713 12/15/22 0517 12/16/22 0616 12/17/22 0423  NA 138  --  135 138  K 3.4*  --  3.6 3.8  CL 102  --  103 103  CO2 25  --  23 25  GLUCOSE 102*  --  130* 139*  BUN 14  --  14 19  CREATININE 0.80 0.64 0.67 0.60  CALCIUM 9.2  --  9.4 9.5  MG  --   --  2.1 2.2    CBC: Recent Labs  Lab 12/14/22 1713 12/15/22 0517 12/17/22 0423  WBC 7.5 6.9 8.8  NEUTROABS  --   --  7.0  HGB 13.5 13.4 12.5  HCT 42.2 42.1 38.6  MCV 80.8 81.9 78.9*  PLT 290 269 273    Coagulation Studies: No results for input(s): "LABPROT", "INR" in the last 72 hours.   Lab Results  Component Value Date   CHOL 154 03/07/2022   HDL 69 03/07/2022   LDLCALC 58 03/07/2022   LDLDIRECT 127.0 08/17/2020   TRIG 160 (H) 03/07/2022   CHOLHDL 2.2 03/07/2022   Lab Results   Component Value Date   HGBA1C 5.8 (H) 03/07/2022   CTA personally reviewed, agree with radiology:   1. 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 2. No hemodynamically significant stenosis in the neck. 3. No intracranial large vessel occlusion. 4. Increased hypodensity in the right thalamocapsular reg ion compared to the 03/28/2022 CT, which correlates with the diffusion restriction noted on the 12/14/2022 MRI. 5. Aortic atherosclerosis.  ECHO  1. Left ventricular ejection fraction, by estimation, is 60 to 65%. The  left ventricle has normal function. The left ventricle has no regional  wall motion abnormalities. There is mild left ventricular hypertrophy.  Left ventricular diastolic parameters  were normal.   2. Right ventricular systolic function is normal. The right ventricular  size is normal.   3. The mitral valve is normal in structure. No evidence of mitral valve  regurgitation.   4. The aortic valve was not well visualized. Aortic valve regurgitation  is not visualized.   5. The inferior vena cava is normal  in size with greater than 50%  respiratory variability, suggesting right atrial pressure of 3 mmHg.   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  Discussed with husband he reports this is a reasonable cost for them and appreciates this may be a slightly more effective medication with slightly increased risk of bleeding  Recommendations: - Pharmacy consult to change Plavix to ticagrelor, after loading dose will continue 90 mg twice daily - Stop Topamax 25 nightly, per chart review this was prescribed many years ago for insomnia  and appetite reduction - Continue Depakote 500 mg nightly - Inpatient neurology will be available as needed, continue outpatient follow-up  Lesleigh Noe MD-PhD [facility] [phone]

## 2022-12-18 NOTE — Telephone Encounter (Signed)
Teressa from [facility] is calling to schedule pt hospital follow-up. Pt is scheduled for 3/13

## 2022-12-18 NOTE — TOC Transition Note (Signed)
Transition of Care [facility]) - CM/SW Discharge Note   Patient Details  Name: Carly [name] MRN: [id number] Date of Birth: [birth year]  [facility]) CM/SW Contact:  Gerilyn Pilgrim, LCSW Phone Number: 12/18/2022, 10:39 AM   Clinical Narrative:   Pt discharging home. [facility] to be arranged. No additional needs at this time. Medical necessity printed to the unit.           Patient Goals and CMS Choice      Discharge Placement                         Discharge Plan and Services Additional resources added to the After Visit Summary for                                       Social Determinants of Health (SDOH) Interventions SDOH Screenings   Food Insecurity: No Food Insecurity (12/15/2022)  Housing: Low Risk  (12/15/2022)  Transportation Needs: No Transportation Needs (12/15/2022)  Utilities: Not At Risk (12/15/2022)  Depression (PHQ2-9): High Risk (12/12/2022)  Financial Resource Strain: Low Risk  (01/07/2022)  Physical Activity: Insufficiently Active (01/07/2022)  Social Connections: Unknown (01/07/2022)  Stress: No Stress Concern Present (01/07/2022)  Tobacco Use: Low Risk  (12/15/2022)     Readmission Risk Interventions     No data to display

## 2022-12-18 NOTE — Discharge Summary (Signed)
Physician Discharge Summary   Patient: Carly [name] MRN: [id number]  DOB: 01-07-1951   Admit:     Date of Admission: 12/14/2022 Admitted from: home   Discharge: Date of discharge: 12/18/22 Disposition: Home health Condition at discharge: fair  CODE STATUS: FULL CODE     Discharge Physician: Emeterio Reeve, DO [facility]     PCP: Einar Pheasant, MD  Recommendations for Outpatient Follow-up:  Follow up with PCP Einar Pheasant, MD in 1-2 weeks Follow up with neurology asap  Please obtain labs/tests: BMP, CBC in 1-2 weeks Please follow up on the following pending results: none    Discharge Instructions     Diet - low sodium heart healthy   Complete by: As directed    Increase activity slowly   Complete by: As directed          Discharge Diagnoses: Principal Problem:   Exacerbation of multiple sclerosis ([place]) Active Problems:   Respiratory tract infection   Headache   Depression, recurrent ([place])   Hypercholesterolemia   Hypertension   DDD (degenerative disc disease), lumbosacral   White matter periventricular infarction ([place])   PBA (pseudobulbar affect)   History of CVA (cerebrovascular accident)   OSA on CPAP   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[place])   Gastroesophageal reflux disease   Multiple sclerosis, relapsing-remitting ([place])   Lymphedema   Swelling of right upper extremity       Hospital Course: Carly [name] is a 72 y.o. female with medical history significant for multiple sclerosis who at baseline is mostly bedbound, hypertension, depression and anxiety, GERD and migraine presented to the emergency room with a 2-week history of weakness of the upper extremities and slurring of her speech as well as swelling of the right arm. She has been having a significant decline in her baseline functional status for the past 2 weeks, with caregiver noting that she used to be able  to feed herself 2 weeks ago but now has been requiring hand feeding.  Caregiver notes that this has been a gradual progressive decline and not a sudden onset of decline. She also has had nasal congestion for the past 3 weeks but without cough, shortness of breath, fever or chills or chest pain.  03/04:  Respiratory viral panel negative.  Labs including CBC, CMP and troponin mostly unremarkable except for potassium of 3.4. Upper extremity venous Doppler negative for DVT. MRI cervical spine consistent with history of demyelinating disease, spinal stenosis and foraminal stenosis. MRI brain with and without contrast consistent with active demyelination, possible evolving subacute ischemic infarct difficult to exclude. Teleneurology recs: 5 days of IV Solu-Medrol 1 g, rehab consult. Hospitalist admitted patient for MS flare. Neurology following, also treating headache w/ depakote x1 dose.  03/05: stable, continuing steroids  03/06: completed 3 days steroids - on further history taken by Dr. Curly [place], more sudden onset L arm weakness c/w CVA > MS, will get Echo and CTA H/N 03/07: CTA H/N as below, Echo no concerns. Changing plavix to ticagrelor. Stop topamax, conitnue depakote.   Consultants:  Neurology  Procedures: none      ASSESSMENT & PLAN:   Exacerbation of multiple sclerosis ([place]) vs new subacute  lacunar vs. embolic CVA 5 days of IV Solumedrol 1 gram was initial plan, has completed 3 days --> d/c steroids per neurology and w/u for CVA as below [name] will arrange close outpatient follow-up to discuss further  Goal blood  pressure normotension  Management of hypertriglyceridemia LDL meeting goal so will not change home rosuvastatin 5 times daily  A1c meeting goal less than 7%  Will obtain CTA head and neck --> possible atherosclerotic disease contributing to CVA Change Plavix to ticagrelor  echocardiogram --> no concerns   Headache management  Depakote for headache prophylaxis per  neurology Stop topamax   Respiratory tract infection likely viral  Respiratory viral panel was negative Chest x-ray within normal [place] for sore throat Other symptomatic care as needed   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[facility]) Continue baclofen  Hx CVA Plavix --> ticagrelor    OSA on CPAP CPAP if desired   DDD (degenerative disc disease), lumbosacral As needed pain meds   Hypertension Continue Cozaar and amlodipine   Depression, recurrent ([facility]) Continue Zoloft, buspirone and Ativan  Swelling of right upper extremity Negative venous Doppler right upper extremity   Lymphedema Stable             Discharge Instructions  Allergies as of 12/18/2022       Reactions   Ibuprofen Swelling, Other (See Comments)   Sulfamethoxazole-trimethoprim Itching   Penicillin G Rash   Asa [aspirin] Swelling   Buspirone Other (See Comments)   Headaches   Levofloxacin Other (See Comments)   "weakness"   Lexapro [escitalopram Oxalate] Other (See Comments)   Weakness   Pollen Extract Hives   Ceftin [cefuroxime Axetil] Rash   Penicillins Rash   Sulfa Antibiotics Rash        Medication List     STOP taking these medications    clopidogrel 75 MG tablet Commonly known as: PLAVIX   predniSONE 10 MG tablet Commonly known as: DELTASONE   topiramate 25 MG tablet Commonly known as: Topamax       TAKE these medications    amLODipine 5 MG tablet Commonly known as: NORVASC TAKE 1 TABLET BY MOUTH DAILY.   baclofen 10 MG tablet Commonly known as: LIORESAL Takes '10mg'$  q 4 hours   buPROPion 150 MG 24 hr tablet Commonly known as: WELLBUTRIN XL Take 1 tablet (150 mg total) by mouth daily.   busPIRone 15 MG tablet Commonly known as: BUSPAR TAKE 1 TABLET BY MOUTH 3 TIMES DAILY   chlorpheniramine-HYDROcodone 10-8 MG/5ML Commonly known as: TUSSIONEX Take 5 mLs by mouth at bedtime as needed for cough.   COQ10  PO Take by mouth.   cyanocobalamin 1000 MCG/ML injection Commonly known as: VITAMIN B12 Inject 1 mL (1,000 mcg total) into the muscle every 30 (thirty) days.   D3-1000 PO Take 2,000 Units by mouth daily.   dalfampridine 10 MG Tb12 Take 1 tablet (10 mg total) by mouth every 12 (twelve) hours.   DEPLIN 15 PO Take 1 tablet by mouth daily.   divalproex 500 MG 24 hr tablet Commonly known as: DEPAKOTE ER Take 1 tablet (500 mg total) by mouth at bedtime.   docusate sodium 100 MG capsule Commonly known as: COLACE Take 100 mg by mouth 2 (two) times daily.   fluticasone 50 MCG/ACT nasal spray Commonly known as: FLONASE Place 1 spray into both nostrils daily.   gabapentin 100 MG capsule Commonly known as: NEURONTIN Take '200mg'$  in the morning, '200mg'$  in the afternoon and '200mg'$  at bedtime.   L-Methylfolate 15 MG Tabs Take 1 tablet (15 mg total) by mouth daily.   loratadine 10 MG tablet Commonly known as: CLARITIN Take 10 mg by mouth daily.   LORazepam 0.5 MG  tablet Commonly known as: ATIVAN TAKE 1 TABLET BY MOUTH EVERY 8 HOURS AS DIRECTED   losartan 50 MG tablet Commonly known as: COZAAR TAKE 1 TABLET BY MOUTH DAILY   MAGNESIUM GLYCINATE PO Take by mouth.   METAMUCIL FIBER PO Take 400 mg by mouth every evening.   nystatin cream Commonly known as: MYCOSTATIN Apply 1 application topically 2 (two) times daily.   ondansetron 4 MG tablet Commonly known as: Zofran Take 1 tablet (4 mg total) by mouth every 8 (eight) hours as needed for nausea or vomiting.   pantoprazole 40 MG tablet Commonly known as: PROTONIX TWICE DAILY AT 7AM AND 5PM   rosuvastatin 5 MG tablet Commonly known as: CRESTOR TAKE 1 TABLET BY MOUTH EVERY MONDAY, [place].   saccharomyces boulardii 250 MG capsule Commonly known as: FLORASTOR Take 1 capsule (250 mg total) by mouth 2 (two) times daily.   sertraline 100 MG tablet Commonly known as: ZOLOFT Take 150 mg by mouth daily.    sodium chloride 0.65 % nasal spray Commonly known as: OCEAN Place 1-2 sprays into the nose as needed.   ticagrelor 90 MG Tabs tablet Commonly known as: BRILINTA Take 1 tablet (90 mg total) by mouth 2 (two) times daily.   Vitamin D (Ergocalciferol) 1.25 MG (50000 UNIT) Caps capsule Commonly known as: DRISDOL Take 1 capsule (50,000 Units total) by mouth every 7 (seven) days.         Follow-up Information     Einar Pheasant, MD. Go on 12/24/2022.   Specialty: Internal Medicine Why: '@11'$ :Max Sane information: [id number] [place]  [id number] [phone]                 Allergies  Allergen Reactions   Ibuprofen Swelling and Other (See Comments)   Sulfamethoxazole-Trimethoprim Itching   Penicillin G Rash   Asa [Aspirin] Swelling   Buspirone Other (See Comments)    Headaches     Levofloxacin Other (See Comments)    "weakness"   Lexapro [Escitalopram Oxalate] Other (See Comments)    Weakness    Pollen Extract Hives   Ceftin [Cefuroxime Axetil] Rash   Penicillins Rash   Sulfa Antibiotics Rash     Subjective: pt has no concerns other than neck pain, chronic    Discharge Exam: BP (!) 143/80 (BP Location: Left Arm)   Pulse 92   Temp 98 F (36.7 C)   Resp 16   Ht '5\' 3"'$  (1.6 m)   Wt 87.5 kg   SpO2 96%   BMI 34.19 kg/m  General: Pt is alert, awake, not in acute distress Cardiovascular: RRR, S1/S2 +, no rubs, no gallops Respiratory: no wheezing, no rhonchi Abdominal: Soft, NT, ND, bowel sounds + Extremities: trace bilateral LE edema, no cyanosis     The results of significant diagnostics from this hospitalization (including imaging, microbiology, ancillary and laboratory) are listed below for reference.     Microbiology: Recent Results (from the past 240 hour(s))  Resp panel by RT-PCR (RSV, Flu A&B, Covid) Anterior Nasal Swab     Status: None   Collection Time: 12/14/22  5:13 PM   Specimen: Anterior Nasal Swab   Result Value Ref Range Status   SARS Coronavirus 2 by RT PCR NEGATIVE NEGATIVE Final    Comment: (NOTE) SARS-CoV-2 target nucleic acids are NOT DETECTED.  The SARS-CoV-2 RNA is generally detectable in upper respiratory specimens during the acute phase of infection. The lowest concentration of SARS-CoV-2 viral copies this assay can detect  is 138 copies/mL. A negative result does not preclude SARS-Cov-2 infection and should not be used as the sole basis for treatment or other patient management decisions. A negative result may occur with  improper specimen collection/handling, submission of specimen other than nasopharyngeal swab, presence of viral mutation(s) within the areas targeted by this assay, and inadequate number of viral copies(<138 copies/mL). A negative result must be combined with clinical observations, patient history, and epidemiological information. The expected result is Negative.  Fact Sheet for Patients:  EntrepreneurPulse.com.au  Fact Sheet for Healthcare Providers:  IncredibleEmployment.be  This test is no t yet approved or cleared by the [place] FDA and  has been authorized for detection and/or diagnosis of SARS-CoV-2 by FDA under an Emergency Use Authorization (EUA). This EUA will remain  in effect (meaning this test can be used) for the duration of the COVID-19 declaration under Section 564(b)(1) of the Act, 21 U.S.C.section 360bbb-3(b)(1), unless the authorization is terminated  or revoked sooner.       Influenza A by PCR NEGATIVE NEGATIVE Final   Influenza B by PCR NEGATIVE NEGATIVE Final    Comment: (NOTE) The Xpert Xpress SARS-CoV-2/FLU/RSV plus assay is intended as an aid in the diagnosis of influenza from Nasopharyngeal swab specimens and should not be used as a sole basis for treatment. Nasal washings and aspirates are unacceptable for Xpert Xpress SARS-CoV-2/FLU/RSV testing.  Fact Sheet for  Patients: EntrepreneurPulse.com.au  Fact Sheet for Healthcare Providers: IncredibleEmployment.be  This test is not yet approved or cleared by the [place] FDA and has been authorized for detection and/or diagnosis of SARS-CoV-2 by FDA under an Emergency Use Authorization (EUA). This EUA will remain in effect (meaning this test can be used) for the duration of the COVID-19 declaration under Section 564(b)(1) of the Act, 21 U.S.C. section 360bbb-3(b)(1), unless the authorization is terminated or revoked.     Resp Syncytial Virus by PCR NEGATIVE NEGATIVE Final    Comment: (NOTE) Fact Sheet for Patients: EntrepreneurPulse.com.au  Fact Sheet for Healthcare Providers: IncredibleEmployment.be  This test is not yet approved or cleared by the [place] FDA and has been authorized for detection and/or diagnosis of SARS-CoV-2 by FDA under an Emergency Use Authorization (EUA). This EUA will remain in effect (meaning this test can be used) for the duration of the COVID-19 declaration under Section 564(b)(1) of the Act, 21 U.S.C. section 360bbb-3(b)(1), unless the authorization is terminated or revoked.  Performed at [facility], [address] [place], [place] [address]      Labs: BNP (last 3 results) Recent Labs    02/28/22 2048  BNP [id number]   Basic Metabolic Panel: Recent Labs  Lab 12/14/22 1713 12/15/22 0517 12/16/22 0616 12/17/22 0423  NA 138  --  135 138  K 3.4*  --  3.6 3.8  CL 102  --  103 103  CO2 25  --  23 25  GLUCOSE 102*  --  130* 139*  BUN 14  --  14 19  CREATININE 0.80 0.64 0.67 0.60  CALCIUM 9.2  --  9.4 9.5  MG  --   --  2.1 2.2   Liver Function Tests: Recent Labs  Lab 12/14/22 1713  AST 25  ALT 20  ALKPHOS 74  BILITOT 0.5  PROT 7.1  ALBUMIN 4.1   No results for input(s): "LIPASE", "AMYLASE" in the last 168 hours. No results for input(s): "AMMONIA" in the  last 168 hours. CBC: Recent Labs  Lab 12/14/22 1713 12/15/22 0517  12/17/22 0423  WBC 7.5 6.9 8.8  NEUTROABS  --   --  7.0  HGB 13.5 13.4 12.5  HCT 42.2 42.1 38.6  MCV 80.8 81.9 78.9*  PLT 290 269 273   Cardiac Enzymes: No results for input(s): "CKTOTAL", "CKMB", "CKMBINDEX", "TROPONINI" in the last 168 hours. BNP: Invalid input(s): "POCBNP" CBG: No results for input(s): "GLUCAP" in the last 168 hours. D-Dimer No results for input(s): "DDIMER" in the last 72 hours. Hgb A1c No results for input(s): "HGBA1C" in the last 72 hours. Lipid Profile No results for input(s): "CHOL", "HDL", "LDLCALC", "TRIG", "CHOLHDL", "LDLDIRECT" in the last 72 hours. Thyroid function studies No results for input(s): "TSH", "T4TOTAL", "T3FREE", "THYROIDAB" in the last 72 hours.  Invalid input(s): "FREET3" Anemia work up No results for input(s): "VITAMINB12", "FOLATE", "FERRITIN", "TIBC", "IRON", "RETICCTPCT" in the last 72 hours. Urinalysis    Component Value Date/Time   COLORURINE YELLOW (A) 09/13/2022 1544   APPEARANCEUR Clear 10/24/2022 1300   LABSPEC 1.016 09/13/2022 1544   PHURINE 7.0 09/13/2022 1544   GLUCOSEU Negative 10/24/2022 1300   GLUCOSEU NEGATIVE 08/16/2018 1633   HGBUR NEGATIVE 09/13/2022 1544   BILIRUBINUR Negative 10/24/2022 1300   KETONESUR NEGATIVE 09/13/2022 1544   PROTEINUR Negative 10/24/2022 1300   PROTEINUR NEGATIVE 09/13/2022 1544   UROBILINOGEN 0.2 08/16/2018 1633   NITRITE Negative 10/24/2022 1300   NITRITE NEGATIVE 09/13/2022 1544   LEUKOCYTESUR Negative 10/24/2022 1300   LEUKOCYTESUR NEGATIVE 09/13/2022 1544   Sepsis Labs Recent Labs  Lab 12/14/22 1713 12/15/22 0517 12/17/22 0423  WBC 7.5 6.9 8.8   Microbiology Recent Results (from the past 240 hour(s))  Resp panel by RT-PCR (RSV, Flu A&B, Covid) Anterior Nasal Swab     Status: None   Collection Time: 12/14/22  5:13 PM   Specimen: Anterior Nasal Swab  Result Value Ref Range Status   SARS  Coronavirus 2 by RT PCR NEGATIVE NEGATIVE Final    Comment: (NOTE) SARS-CoV-2 target nucleic acids are NOT DETECTED.  The SARS-CoV-2 RNA is generally detectable in upper respiratory specimens during the acute phase of infection. The lowest concentration of SARS-CoV-2 viral copies this assay can detect is 138 copies/mL. A negative result does not preclude SARS-Cov-2 infection and should not be used as the sole basis for treatment or other patient management decisions. A negative result may occur with  improper specimen collection/handling, submission of specimen other than nasopharyngeal swab, presence of viral mutation(s) within the areas targeted by this assay, and inadequate number of viral copies(<138 copies/mL). A negative result must be combined with clinical observations, patient history, and epidemiological information. The expected result is Negative.  Fact Sheet for Patients:  EntrepreneurPulse.com.au  Fact Sheet for Healthcare Providers:  IncredibleEmployment.be  This test is no t yet approved or cleared by the [place] FDA and  has been authorized for detection and/or diagnosis of SARS-CoV-2 by FDA under an Emergency Use Authorization (EUA). This EUA will remain  in effect (meaning this test can be used) for the duration of the COVID-19 declaration under Section 564(b)(1) of the Act, 21 U.S.C.section 360bbb-3(b)(1), unless the authorization is terminated  or revoked sooner.       Influenza A by PCR NEGATIVE NEGATIVE Final   Influenza B by PCR NEGATIVE NEGATIVE Final    Comment: (NOTE) The Xpert Xpress SARS-CoV-2/FLU/RSV plus assay is intended as an aid in the diagnosis of influenza from Nasopharyngeal swab specimens and should not be used as a sole basis for treatment. Nasal washings and aspirates are  unacceptable for Xpert Xpress SARS-CoV-2/FLU/RSV testing.  Fact Sheet for  Patients: EntrepreneurPulse.com.au  Fact Sheet for Healthcare Providers: IncredibleEmployment.be  This test is not yet approved or cleared by the [place] FDA and has been authorized for detection and/or diagnosis of SARS-CoV-2 by FDA under an Emergency Use Authorization (EUA). This EUA will remain in effect (meaning this test can be used) for the duration of the COVID-19 declaration under Section 564(b)(1) of the Act, 21 U.S.C. section 360bbb-3(b)(1), unless the authorization is terminated or revoked.     Resp Syncytial Virus by PCR NEGATIVE NEGATIVE Final    Comment: (NOTE) Fact Sheet for Patients: EntrepreneurPulse.com.au  Fact Sheet for Healthcare Providers: IncredibleEmployment.be  This test is not yet approved or cleared by the [place] FDA and has been authorized for detection and/or diagnosis of SARS-CoV-2 by FDA under an Emergency Use Authorization (EUA). This EUA will remain in effect (meaning this test can be used) for the duration of the COVID-19 declaration under Section 564(b)(1) of the Act, 21 U.S.C. section 360bbb-3(b)(1), unless the authorization is terminated or revoked.  Performed at [facility], [place], [place] [address]    Imaging CT ANGIO HEAD NECK W WO CM  Result Date: 12/17/2022 CLINICAL DATA:  Right thalamocapsular demyelinating lesion versus subacute infarct on MRI EXAM: CT ANGIOGRAPHY HEAD AND NECK TECHNIQUE: Multidetector CT imaging of the head and neck was performed using the standard protocol during bolus administration of intravenous contrast. Multiplanar CT image reconstructions and MIPs were obtained to evaluate the vascular anatomy. Carotid stenosis measurements (when applicable) are obtained utilizing NASCET criteria, using the distal internal carotid diameter as the denominator. RADIATION DOSE REDUCTION: This exam was performed according  to the departmental dose-optimization program which includes automated exposure control, adjustment of the mA and/or kV according to patient size and/or use of iterative reconstruction technique. CONTRAST:  84m OMNIPAQUE IOHEXOL 350 MG/ML SOLN COMPARISON:  No prior CTA available, correlation is made with MRA head 12/09/2020, MRI head 12/14/2022, and and CT head 03/28/2022 FINDINGS: CT HEAD FINDINGS Brain: Increased hypodensity in the right thalamocapsular region compared to the 03/28/2022 CT, which correlates with the diffusion restriction noted on the 12/14/2022 MRI. No evidence of additional acute infarct, hemorrhage, mass, mass effect, or midline shift. No hydrocephalus or extra-axial fluid collection. Confluent hypodensities in the bilateral cerebral white matter are stable. Mild-to-moderate ventriculomegaly, likely reflecting central cerebral atrophy. Vascular: Atherosclerotic calcifications in the intracranial carotid and vertebral arteries. No hyperdense vessel. Skull: Negative for fracture or focal lesion. Sinuses/Orbits: No acute finding. Other: The mastoid air cells are well aerated. CTA NECK FINDINGS Aortic arch: Standard branching. Imaged portion shows no evidence of aneurysm or dissection. No significant stenosis of the major arch vessel origins. Aortic atherosclerosis. Right carotid system: No evidence of stenosis, dissection, or occlusion. Retropharyngeal course of the distal CCA. Left carotid system: No evidence of stenosis, dissection, or occlusion. Vertebral arteries: No evidence of stenosis, dissection, or occlusion. Skeleton: No acute osseous abnormality. Degenerative changes in the cervical spine. Other neck: No acute finding. Upper chest: No focal pulmonary opacity or pleural effusion. Review of the MIP images confirms the above findings CTA HEAD FINDINGS Anterior circulation: Both internal carotid arteries are patent to the termini, without significant stenosis. Patent right A1. Aplastic left  A1. Normal anterior communicating artery. Anterior cerebral arteries are patent to their distal aspects. No M1 stenosis or occlusion. MCA branches perfused and symmetric. Posterior circulation: Vertebral arteries patent to the vertebrobasilar junction without stenosis. Posterior inferior  cerebellar arteries patent proximally. Basilar patent to its distal aspect. Superior cerebellar arteries patent proximally. Patent P1 segments. Focal severe stenosis in the distal left P2 (series 10, image 98), just proximal to the bifurcation, with additional severe stenosis in both P3 branches (series 10, images 92, 97, 99). Multifocal moderate stenosis in the right P2 (series 10, image 100) and severe stenosis in the more lateral right P3 branch (series 10, image 89). The right posterior communicating artery is patent. The left posterior communicating artery is not visualized. Venous sinuses: Not well opacified due to contrast timing. Anatomic variants: None significant. Review of the MIP images confirms the above findings IMPRESSION: 1. 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 2. No hemodynamically significant stenosis in the neck. 3. No intracranial large vessel occlusion. 4. Increased hypodensity in the right thalamocapsular region compared to the 03/28/2022 CT, which correlates with the diffusion restriction noted on the 12/14/2022 MRI. 5. Aortic atherosclerosis. Aortic Atherosclerosis (ICD10-I70.0). Electronically Signed   By: Merilyn Baba M.D.   On: 12/17/2022 20:26   ECHOCARDIOGRAM COMPLETE  Result Date: 12/17/2022    ECHOCARDIOGRAM REPORT   Patient Name:   JENSON KILIC Date of Exam: 12/17/2022 Medical Rec #:  [id number]           Height:       63.0 in Accession #:    [id number]          Weight:       193.0 lb Date of Birth:  26-Feb-1951           BSA:          1.905 m Patient Age:     72 years            BP:           112/70 mmHg Patient Gender: F                   HR:           83 bpm. Exam Location:  [facility] Procedure: 2D Echo, Cardiac Doppler and Color Doppler Indications:     Stroke  History:         Patient has prior history of Echocardiogram examinations, most                  recent 12/11/2020. Stroke, Signs/Symptoms:Edema and                  Dizziness/Lightheadedness; Risk Factors:Hypertension and                  Dyslipidemia. Multiple Sclerosis.  Sonographer:     Wenda Low Referring Phys:  [id number] Lorenza Chick Diagnosing Phys: Kate Sable MD  Sonographer Comments: Technically difficult study due to poor echo windows, suboptimal parasternal window, suboptimal apical window and suboptimal subcostal window. IMPRESSIONS  1. Left ventricular ejection fraction, by estimation, is 60 to 65%. The left ventricle has normal function. The left ventricle has no regional wall motion abnormalities. There is mild left ventricular hypertrophy. Left ventricular diastolic parameters were normal.  2. Right ventricular systolic function is normal. The right ventricular size is normal.  3. The mitral valve is normal in structure. No evidence of mitral valve regurgitation.  4. The aortic valve was not well visualized. Aortic valve regurgitation is not visualized.  5. The inferior vena cava is  normal in size with greater than 50% respiratory variability, suggesting right atrial pressure of 3 mmHg. FINDINGS  Left Ventricle: Left ventricular ejection fraction, by estimation, is 60 to 65%. The left ventricle has normal function. The left ventricle has no regional wall motion abnormalities. The left ventricular internal cavity size was normal in size. There is  mild left ventricular hypertrophy. Left ventricular diastolic parameters were normal. Right Ventricle: The right ventricular size is normal. No increase in right ventricular wall thickness. Right ventricular systolic function is normal. Left  Atrium: Left atrial size was normal in size. Right Atrium: Right atrial size was normal in size. Pericardium: There is no evidence of pericardial effusion. Mitral Valve: The mitral valve is normal in structure. No evidence of mitral valve regurgitation. MV peak gradient, 4.6 mmHg. The mean mitral valve gradient is 2.0 mmHg. Tricuspid Valve: The tricuspid valve is not well visualized. Tricuspid valve regurgitation is not demonstrated. Aortic Valve: The aortic valve was not well visualized. Aortic valve regurgitation is not visualized. Aortic valve mean gradient measures 2.0 mmHg. Aortic valve peak gradient measures 5.1 mmHg. Aortic valve area, by VTI measures 2.65 cm. Pulmonic Valve: The pulmonic valve was not well visualized. Pulmonic valve regurgitation is not visualized. Aorta: The aortic root is normal in size and structure. Venous: The inferior vena cava is normal in size with greater than 50% respiratory variability, suggesting right atrial pressure of 3 mmHg. IAS/Shunts: The interatrial septum was not well visualized.  LEFT VENTRICLE PLAX 2D LVIDd:         3.30 cm   Diastology LVIDs:         2.10 cm   LV e' medial:    8.92 cm/s LV PW:         1.20 cm   LV E/e' medial:  10.3 LV IVS:        1.20 cm   LV e' lateral:   12.00 cm/s LVOT diam:     2.00 cm   LV E/e' lateral: 7.7 LV SV:         54 LV SV Index:   29 LVOT Area:     3.14 cm  LEFT ATRIUM         Index LA diam:    2.90 cm 1.52 cm/m  AORTIC VALVE                    PULMONIC VALVE AV Area (Vmax):    2.55 cm     PV Vmax:       1.17 m/s AV Area (Vmean):   2.80 cm     PV Peak grad:  5.5 mmHg AV Area (VTI):     2.65 cm AV Vmax:           113.00 cm/s AV Vmean:          67.400 cm/s AV VTI:            0.205 m AV Peak Grad:      5.1 mmHg AV Mean Grad:      2.0 mmHg LVOT Vmax:         91.80 cm/s LVOT Vmean:        60.000 cm/s LVOT VTI:          0.173 m LVOT/AV VTI ratio: 0.84  AORTA Ao Root diam: 3.50 cm MITRAL VALVE MV Area (PHT): 3.89 cm    SHUNTS MV Area  VTI:   1.98 cm    Systemic VTI:  0.17 m MV Peak  grad:  4.6 mmHg    Systemic Diam: 2.00 cm MV Mean grad:  2.0 mmHg MV Vmax:       1.07 m/s MV Vmean:      66.1 cm/s MV Decel Time: 195 msec MV E velocity: 92.20 cm/s MV A velocity: 88.60 cm/s MV E/A ratio:  1.04 Kate Sable MD Electronically signed by Kate Sable MD Signature Date/Time: 12/17/2022/4:02:26 PM    Final       Time coordinating discharge: over 30 minutes  SIGNED:  Emeterio Reeve DO [facility]

## 2022-12-18 NOTE — Transitions of Care (Post Inpatient/ED Visit) (Signed)
   12/18/2022  Name: Carly [name] MRN: [id number] DOB: September 10, 1951  Today's TOC FU Call Status: Today's TOC FU Call Status:: Successful TOC FU Call Competed TOC FU Call Complete Date: 12/18/22  Transition Care Management Follow-up Telephone Call Date of Discharge: 12/18/22 Discharge Facility: [facility]) Type of Discharge: Inpatient Admission Primary Inpatient Discharge Diagnosis:: MS exacerbation How have you been since you were released from the hospital?: Better  Items Reviewed: Did you receive and understand the discharge instructions provided?: No Medications obtained and verified?: Yes (Medications Reviewed) Any new allergies since your discharge?: No Dietary orders reviewed?: Yes Do you have support at home?: Yes People in Home: spouse  Home Care and Equipment/Supplies: [place] Ordered?: Yes Name of [place]:: [place] Has Agency set up a time to come to your home?: Yes [place] Visit Date: 12/19/22 Any new equipment or medical supplies ordered?: NA  Functional Questionnaire: Do you need assistance with bathing/showering or dressing?: Yes Do you need assistance with meal preparation?: Yes Do you need assistance with eating?: Yes Do you have difficulty maintaining continence: Yes Do you need assistance with getting out of bed/getting out of a chair/moving?: Yes Do you have difficulty managing or taking your medications?: Yes  Folllow up appointments reviewed: PCP Follow-up appointment confirmed?: Yes Date of PCP follow-up appointment?: 12/24/22 Follow-up Provider: Dr Nicki Reaper [facility] Follow-up appointment confirmed?: NA Do you need transportation to your follow-up appointment?: No Do you understand care options if your condition(s) worsen?: Yes-patient verbalized understanding    [place], [place] Direct Dial [phone]

## 2022-12-21 ENCOUNTER — Emergency Department
Admission: EM | Admit: 2022-12-21 | Discharge: 2022-12-21 | Disposition: A | Discharge status: Home / Self Care | Payer: Medicare Other | Attending: Emergency Medicine | Admitting: Emergency Medicine

## 2022-12-21 ENCOUNTER — Emergency Department: Payer: Medicare Other

## 2022-12-21 DIAGNOSIS — I10 Essential (primary) hypertension: Secondary | ICD-10-CM | POA: Diagnosis not present

## 2022-12-21 DIAGNOSIS — R471 Dysarthria and anarthria: Secondary | ICD-10-CM | POA: Insufficient documentation

## 2022-12-21 DIAGNOSIS — R9082 White matter disease, unspecified: Secondary | ICD-10-CM | POA: Insufficient documentation

## 2022-12-21 DIAGNOSIS — R531 Weakness: Secondary | ICD-10-CM

## 2022-12-21 DIAGNOSIS — Z7401 Bed confinement status: Secondary | ICD-10-CM | POA: Diagnosis not present

## 2022-12-21 DIAGNOSIS — R4781 Slurred speech: Secondary | ICD-10-CM | POA: Diagnosis not present

## 2022-12-21 DIAGNOSIS — G35 Multiple sclerosis: Secondary | ICD-10-CM | POA: Diagnosis not present

## 2022-12-21 DIAGNOSIS — R69 Illness, unspecified: Secondary | ICD-10-CM | POA: Diagnosis not present

## 2022-12-21 DIAGNOSIS — G9389 Other specified disorders of brain: Secondary | ICD-10-CM | POA: Diagnosis not present

## 2022-12-21 DIAGNOSIS — R29810 Facial weakness: Secondary | ICD-10-CM | POA: Insufficient documentation

## 2022-12-21 DIAGNOSIS — Z8673 Personal history of transient ischemic attack (TIA), and cerebral infarction without residual deficits: Secondary | ICD-10-CM | POA: Insufficient documentation

## 2022-12-21 DIAGNOSIS — R791 Abnormal coagulation profile: Secondary | ICD-10-CM | POA: Diagnosis not present

## 2022-12-21 DIAGNOSIS — R29818 Other symptoms and signs involving the nervous system: Secondary | ICD-10-CM | POA: Diagnosis not present

## 2022-12-21 LAB — CBC
HCT: 39.6 % (ref 36.0–46.0)
Hemoglobin: 12.7 g/dL (ref 12.0–15.0)
MCH: 26.1 pg (ref 26.0–34.0)
MCHC: 32.1 g/dL (ref 30.0–36.0)
MCV: 81.3 fL (ref 80.0–100.0)
Platelets: 282 10*3/uL (ref 150–400)
RBC: 4.87 MIL/uL (ref 3.87–5.11)
RDW: 16 % — ABNORMAL HIGH (ref 11.5–15.5)
WBC: 12.8 10*3/uL — ABNORMAL HIGH (ref 4.0–10.5)
nRBC: 0 % (ref 0.0–0.2)

## 2022-12-21 LAB — DIFFERENTIAL
Abs Immature Granulocytes: 0.04 10*3/uL (ref 0.00–0.07)
Basophils Absolute: 0 10*3/uL (ref 0.0–0.1)
Basophils Relative: 0 %
Eosinophils Absolute: 0.4 10*3/uL (ref 0.0–0.5)
Eosinophils Relative: 3 %
Immature Granulocytes: 0 %
Lymphocytes Relative: 15 %
Lymphs Abs: 2 10*3/uL (ref 0.7–4.0)
Monocytes Absolute: 1 10*3/uL (ref 0.1–1.0)
Monocytes Relative: 8 %
Neutro Abs: 9.5 10*3/uL — ABNORMAL HIGH (ref 1.7–7.7)
Neutrophils Relative %: 74 %

## 2022-12-21 LAB — ETHANOL: Alcohol, Ethyl (B): <10 mg/dL (ref <10)

## 2022-12-21 LAB — COMPREHENSIVE METABOLIC PANEL
ALT: 17 U/L (ref 0–44)
AST: 17 U/L (ref 15–41)
Albumin: 3.6 g/dL (ref 3.5–5.0)
Alkaline Phosphatase: 64 U/L (ref 38–126)
Anion gap: 8 (ref 5–15)
BUN: 11 mg/dL (ref 8–23)
CO2: 26 mmol/L (ref 22–32)
Calcium: 8.3 mg/dL — ABNORMAL LOW (ref 8.9–10.3)
Chloride: 103 mmol/L (ref 98–111)
Creatinine, Ser: 0.59 mg/dL (ref 0.44–1.00)
GFR, Estimated: >60 mL/min (ref >60)
Glucose, Bld: 103 mg/dL — ABNORMAL HIGH (ref 70–99)
Potassium: 3.2 mmol/L — ABNORMAL LOW (ref 3.5–5.1)
Sodium: 137 mmol/L (ref 135–145)
Total Bilirubin: 0.6 mg/dL (ref 0.3–1.2)
Total Protein: 6.3 g/dL — ABNORMAL LOW (ref 6.5–8.1)

## 2022-12-21 LAB — CBG MONITORING, ED: Glucose-Capillary: 126 mg/dL — ABNORMAL HIGH (ref 70–99)

## 2022-12-21 LAB — PROTIME-INR
INR: 1.1 (ref 0.8–1.2)
Prothrombin Time: 13.7 seconds (ref 11.4–15.2)

## 2022-12-21 LAB — APTT: aPTT: 27 seconds (ref 24–36)

## 2022-12-21 MED ORDER — SODIUM CHLORIDE 0.9% FLUSH
3.0000 mL | Freq: Once | INTRAVENOUS | Status: AC
  Administered 2022-12-21: 3 mL via INTRAVENOUS

## 2022-12-21 MED ORDER — LORAZEPAM 2 MG/ML IJ SOLN
0.5000 mg | Freq: Once | INTRAMUSCULAR | Status: AC
  Administered 2022-12-21: 0.5 mg via INTRAVENOUS
  Filled 2022-12-21: qty 1

## 2022-12-21 MED ORDER — IOHEXOL 350 MG/ML SOLN
100.0000 mL | Freq: Once | INTRAVENOUS | Status: AC | PRN
  Administered 2022-12-21: 100 mL via INTRAVENOUS

## 2022-12-21 NOTE — Progress Notes (Signed)
Code Stroke activated @ [id number].  EMS pre-alert.  To CT @ 1855.  Dr. Leonel Ramsay on camera @ 1902 in [place].   Jarold Song BSN, Horticulturist, commercial

## 2022-12-21 NOTE — Consult Note (Incomplete)
[facility] Telemedicine Consult   Requesting Provider: *** Consult Participants: *** Location of the provider: *** Location of the patient: ***  This consult was provided via telemedicine with 2-way video and audio communication. The patient/family was informed that care would be provided in this way and agreed to receive care in this manner.    Chief Complaint: ***  HPI: ***    LKW: *** tpa given?: No, *** IR Thrombectomy? No, *** {Modified Rankin Scale:21264:::1} Time of teleneurologist evaluation: ***  Exam: There were no vitals filed for this visit.  General: ***  1A: Level of Consciousness - *** 1B: Ask Month and Age - *** 1C: 'Blink Eyes' & 'Squeeze Hands' - *** 2: Test Horizontal Extraocular Movements - *** 3: Test Visual Fields - *** 4: Test Facial Palsy - *** 5A: Test Left Arm Motor Drift - *** 5B: Test Right Arm Motor Drift - *** 6A: Test Left Leg Motor Drift - *** 6B: Test Right Leg Motor Drift - *** 7: Test Limb Ataxia - *** 8: Test Sensation - *** 9: Test Language/Aphasia- *** 10: Test Dysarthria - *** 11: Test Extinction/Inattention - *** NIHSS score: ***   Imaging Reviewed: ***  Labs reviewed in epic and pertinent values follow: ***   Assessment: ***  Recommendations: ***    This patient is receiving care for possible acute neurological changes. There was *** minutes of care by this provider at the time of service, including time for direct evaluation via telemedicine, review of medical records, imaging studies and discussion of findings with providers, the patient and/or family.  Roland Rack, MD [facility] [phone]  If 7pm- 7am, please page neurology on call as listed in [place].

## 2022-12-21 NOTE — ED Triage Notes (Signed)
Pt to ED with slurred speech, left sided leaning, and minor left sided facial droop since 1500. LKW estimated to be 1300. Pt is alert and oriented to person and place. CBG 126. Pt has a history of stroke-like symptoms last week (no TNK) and MS. Pt is on blood thinners.

## 2022-12-21 NOTE — Consult Note (Signed)
[facility] Telemedicine Consult   Requesting Provider: Mumma, S Consult Participants: Bedside nurse, patient Location of the provider: [place], [place] Location of the patient: [facility]  This consult was provided via telemedicine with 2-way video and audio communication. The patient/family was informed that care would be provided in this way and agreed to receive care in this manner.    Chief Complaint: Leaning to the left  HPI: 72 year old female who was recently admitted and had an MRI that was felt to be most consistent with small acute subcortical stroke.  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  She received 3 days of it.  Likelihood of this being a lesion at her age with primary progressive MS.  To be very low and therefore this was treated as a stroke.  Family noticed her moving more to the side today and therefore brought her back for evaluation where a code stroke was activated.    LKW: Prior to initial presentation last week tpa given?: No, out of window IR Thrombectomy? No, no LVO Modified Rankin Scale: 5-Severe disability-bedridden, incontinent, needs constant attention  Exam: There were no vitals filed for this visit.  General: in bed, nad  1A: Level of Consciousness - 0 1B: Ask Month and Age - 1(April, 67, also oculd not give year) 1C: 'Blink Eyes' & 'Squeeze Hands' - 0 2: Test Horizontal Extraocular Movements - 0 3: Test Visual Fields - 0 4: Test Facial Palsy - 0 5A: Test Left Arm Motor Drift - 2 5B: Test Right Arm Motor Drift - 2 6A: Test Left Leg Motor Drift - 3 6B: Test Right Leg Motor Drift - 3 7: Test Limb Ataxia - 0, unable to test due to weakness 8: Test Sensation - 0 9: Test Language/Aphasia- 0 10: Test Dysarthria - 0 11: Test Extinction/Inattention - 0 NIHSS score: 11   Imaging Reviewed: CT/CTA - negative acute  Labs  reviewed in epic and pertinent values follow:  Cbg 126  Assessment: 72 year old female with primary progressive MS as well as recent subcortical stroke with possible mild fluctuation symptoms.  My suspicion is that this just represents a waxing and waning of symptoms as opposed to a new event.  I do think repeating the MRI to ensure that she does not have a new discrete event would be prudent, if she does then would likely need to be readmitted for repeat PT/OT evaluation.  Recommendations: 1) continue home ticagrelor 2) MRI brain 3) no further recommendations unless there is a new discrete area of ischemia on the MRI.  Roland Rack, MD [facility] [phone]  If 7pm- 7am, please page neurology on call as listed in [place].

## 2022-12-21 NOTE — ED Notes (Signed)
Code STROKE was called by ems

## 2022-12-21 NOTE — ED Provider Notes (Signed)
[facility] Provider Note    Event Date/Time   First MD Initiated Contact with Patient 12/21/22 1854     (approximate)   History   Code Stroke   HPI  Carly [name] is a 72 y.o. female past medical history significant for multiple sclerosis who at baseline is mostly bedbound, GERD, history of migraines, recent CVA, who presents to the emergency department with concern for strokelike symptoms.  Patient had a recent admission for left-sided arm weakness concerning for possible CVA and demyelinating disease.  Patient was given a short course of some steroids but ultimately was discharged home on Brilinta.  Patient's last known well was around 3 PM today.  Husband and caregiver noted that she had change of speech and had worsening weakness, leaning to 1 side when trying to use her lift.  This is new when compared to her most recent hospital visit.  Not on anticoagulation.  No recent fall.     Physical Exam   Triage Vital Signs: ED Triage Vitals  Enc Vitals Group     BP      Pulse      Resp      Temp      Temp src      SpO2      Weight      Height      Head Circumference      Peak Flow      Pain Score      Pain Loc      Pain Edu?      Excl. in [place]?     Most recent vital signs: Vitals:   12/21/22 2000  BP: 133/89  Pulse: 74  SpO2: 97%    Physical Exam Constitutional:      Appearance: She is well-developed.  HENT:     Head: Atraumatic.  Eyes:     Conjunctiva/sclera: Conjunctivae normal.  Cardiovascular:     Rate and Rhythm: Regular rhythm.  Pulmonary:     Effort: No respiratory distress.  Abdominal:     General: There is no distension.  Musculoskeletal:        General: Normal range of motion.     Cervical back: Normal range of motion.  Skin:    General: Skin is warm.  Neurological:     Mental Status: She is alert.     Comments: Slurred speech, difficulty with naming.     IMPRESSION / MDM / ASSESSMENT AND PLAN / ED COURSE   I reviewed the triage vital signs and the nursing notes.  Differential diagnosis concerning for possible CVA, patient would be an TNK candidate.  Activated code stroke on arrival.  Patient received CT scan and CTA to evaluate for possible LVO.  CT scan discussed the results with radiology who did not show any acute bleed.  Discussed the patient's case with neurology Dr. Leonel Ramsay, recommended an MRI.  Did not recommend TNK, concern more consistent with demyelinating disease.  Recommended MRI to evaluate for acute CVA, if no signs of acute CVA likely would be able to discharge home but otherwise will need admission.  EKG  I, Nathaniel Man, the attending physician, personally viewed and interpreted this ECG.   Rate: Normal  Rhythm: Normal sinus  Axis: Normal  Intervals: Normal  ST&T Change: None  No tachycardic or bradycardic dysrhythmias while on cardiac telemetry.  RADIOLOGY I independently reviewed imaging, my interpretation of imaging: CT scan of the head with no signs of intracranial hemorrhage or  infarction.  LABS (all labs ordered are listed, but only abnormal results are displayed) Labs interpreted as -    Labs Reviewed  CBC - Abnormal; Notable for the following components:      Result Value   WBC 12.8 (*)    RDW 16.0 (*)    All other components within normal limits  DIFFERENTIAL - Abnormal; Notable for the following components:   Neutro Abs 9.5 (*)    All other components within normal limits  COMPREHENSIVE METABOLIC PANEL - Abnormal; Notable for the following components:   Potassium 3.2 (*)    Glucose, Bld 103 (*)    Calcium 8.3 (*)    Total Protein 6.3 (*)    All other components within normal limits  CBG MONITORING, ED - Abnormal; Notable for the following components:   Glucose-Capillary 126 (*)    All other components within normal limits  PROTIME-INR  APTT  ETHANOL  URINALYSIS, W/ REFLEX TO CULTURE (INFECTION SUSPECTED)     TREATMENT    MDM  Patient presented as an activated code stroke.  Patient was evaluated by neurology and had CTA and MRI that did not show any acute stroke.  Discussed with Dr. Leonel Ramsay who recommended discharge home with ongoing use of her Brilinta from her recent subacute infarct.     PROCEDURES:  Critical Care performed: yes  .Critical Care  Performed by: Nathaniel Man, MD Authorized by: Nathaniel Man, MD   Critical care provider statement:    Critical care time (minutes):  30   Critical care time was exclusive of:  Separately billable procedures and treating other patients   Critical care was necessary to treat or prevent imminent or life-threatening deterioration of the following conditions:  CNS failure or compromise   Critical care was time spent personally by me on the following activities:  Development of treatment plan with patient or surrogate, discussions with consultants, evaluation of patient's response to treatment, examination of patient, ordering and review of laboratory studies, ordering and review of radiographic studies, ordering and performing treatments and interventions, pulse oximetry, re-evaluation of patient's condition and review of old charts   Patient's presentation is most consistent with acute presentation with potential threat to life or bodily function.   MEDICATIONS ORDERED IN ED: Medications  sodium chloride flush (NS) 0.9 % injection 3 mL (3 mLs Intravenous Given 12/21/22 1920)  iohexol (OMNIPAQUE) 350 MG/ML injection 100 mL (100 mLs Intravenous Contrast Given 12/21/22 1921)  LORazepam (ATIVAN) injection 0.5 mg (0.5 mg Intravenous Given 12/21/22 1943)    FINAL CLINICAL IMPRESSION(S) / ED DIAGNOSES   Final diagnoses:  Dysarthria     Rx / DC Orders   ED Discharge Orders     None        Note:  This document was prepared using Dragon voice recognition software and may include unintentional dictation errors.   Nathaniel Man, MD 12/21/22 2308

## 2022-12-22 ENCOUNTER — Telehealth: Payer: Self-pay

## 2022-12-22 NOTE — Telephone Encounter (Signed)
Sherrie called from [employer] to state patient's husband requested a hospice assessment for patient.  Sherrie states she would like to see if Dr. Einar Pheasant will send them an order for hospice care.  If so, please fax to [phone].

## 2022-12-22 NOTE — Telephone Encounter (Signed)
She sees you 3/13 for HFU. Do you want me to make note to discuss hospice?

## 2022-12-22 NOTE — Telephone Encounter (Signed)
Ok

## 2022-12-23 ENCOUNTER — Other Ambulatory Visit: Payer: Self-pay | Admitting: *Deleted

## 2022-12-23 DIAGNOSIS — F32A Depression, unspecified: Secondary | ICD-10-CM | POA: Diagnosis not present

## 2022-12-23 DIAGNOSIS — R519 Headache, unspecified: Secondary | ICD-10-CM | POA: Diagnosis not present

## 2022-12-23 DIAGNOSIS — I69818 Other symptoms and signs involving cognitive functions following other cerebrovascular disease: Secondary | ICD-10-CM | POA: Diagnosis not present

## 2022-12-23 DIAGNOSIS — G35 Multiple sclerosis: Secondary | ICD-10-CM | POA: Diagnosis not present

## 2022-12-23 DIAGNOSIS — I10 Essential (primary) hypertension: Secondary | ICD-10-CM | POA: Diagnosis not present

## 2022-12-23 DIAGNOSIS — K219 Gastro-esophageal reflux disease without esophagitis: Secondary | ICD-10-CM | POA: Diagnosis not present

## 2022-12-23 DIAGNOSIS — G4733 Obstructive sleep apnea (adult) (pediatric): Secondary | ICD-10-CM | POA: Diagnosis not present

## 2022-12-23 NOTE — Telephone Encounter (Signed)
Order faxed to hospice agreeing to be the patients attending provider.

## 2022-12-23 NOTE — Telephone Encounter (Signed)
Verbal given for hospice to go out and assess per Dr Nicki Reaper.. Orders being faxed for signature.

## 2022-12-23 NOTE — Telephone Encounter (Signed)
Noted to discuss hospice with patient.

## 2022-12-24 ENCOUNTER — Encounter: Payer: Self-pay | Admitting: Internal Medicine

## 2022-12-24 ENCOUNTER — Telehealth: Payer: Medicare Other | Admitting: Internal Medicine

## 2022-12-24 VITALS — Ht 63.0 in | Wt 193.0 lb

## 2022-12-24 DIAGNOSIS — K5909 Other constipation: Secondary | ICD-10-CM | POA: Diagnosis not present

## 2022-12-24 DIAGNOSIS — F419 Anxiety disorder, unspecified: Secondary | ICD-10-CM | POA: Diagnosis not present

## 2022-12-24 DIAGNOSIS — K219 Gastro-esophageal reflux disease without esophagitis: Secondary | ICD-10-CM | POA: Diagnosis not present

## 2022-12-24 DIAGNOSIS — F32A Depression, unspecified: Secondary | ICD-10-CM

## 2022-12-24 DIAGNOSIS — G4733 Obstructive sleep apnea (adult) (pediatric): Secondary | ICD-10-CM | POA: Diagnosis not present

## 2022-12-24 DIAGNOSIS — M5137 Other intervertebral disc degeneration, lumbosacral region: Secondary | ICD-10-CM | POA: Diagnosis not present

## 2022-12-24 DIAGNOSIS — R269 Unspecified abnormalities of gait and mobility: Secondary | ICD-10-CM

## 2022-12-24 DIAGNOSIS — I10 Essential (primary) hypertension: Secondary | ICD-10-CM | POA: Diagnosis not present

## 2022-12-24 DIAGNOSIS — E7800 Pure hypercholesterolemia, unspecified: Secondary | ICD-10-CM | POA: Diagnosis not present

## 2022-12-24 DIAGNOSIS — G35D Multiple sclerosis, unspecified: Secondary | ICD-10-CM

## 2022-12-24 DIAGNOSIS — G35 Multiple sclerosis: Secondary | ICD-10-CM

## 2022-12-24 DIAGNOSIS — F339 Major depressive disorder, recurrent, unspecified: Secondary | ICD-10-CM | POA: Diagnosis not present

## 2022-12-24 DIAGNOSIS — Z8673 Personal history of transient ischemic attack (TIA), and cerebral infarction without residual deficits: Secondary | ICD-10-CM

## 2022-12-24 DIAGNOSIS — M51379 Other intervertebral disc degeneration, lumbosacral region without mention of lumbar back pain or lower extremity pain: Secondary | ICD-10-CM

## 2022-12-24 DIAGNOSIS — R739 Hyperglycemia, unspecified: Secondary | ICD-10-CM | POA: Diagnosis not present

## 2022-12-24 DIAGNOSIS — R609 Edema, unspecified: Secondary | ICD-10-CM

## 2022-12-24 DIAGNOSIS — I69818 Other symptoms and signs involving cognitive functions following other cerebrovascular disease: Secondary | ICD-10-CM | POA: Diagnosis not present

## 2022-12-24 NOTE — Progress Notes (Deleted)
Subjective:    Patient ID: Carly [name], female    DOB: Oct 30, 1950, 72 y.o.   MRN: [id number]  Patient here for  Chief Complaint  Patient presents with   Hospitalization Follow-up    Discuss hospice    HPI    Past Medical History:  Diagnosis Date   Allergy    Anxiety    Aspiration pneumonia ([place])    Depression    Frequent headaches    H/O   GERD (gastroesophageal reflux disease)    History of chicken pox    History of colon polyps    Hx of migraines    Hypertension    Lymphedema    Multiple sclerosis ([place]) 2011   OSA (obstructive sleep apnea)    PONV (postoperative nausea and vomiting)    Scoliosis    Past Surgical History:  Procedure Laterality Date   BACK SURGERY     CHOLECYSTECTOMY     COLONOSCOPY N/A 04/23/2022   Procedure: COLONOSCOPY;  Surgeon: Toledo, Benay Pike, MD;  Location: [facility] ENDOSCOPY;  Service: Gastroenterology;  Laterality: N/A;   COLONOSCOPY WITH PROPOFOL N/A 05/18/2017   Procedure: COLONOSCOPY WITH PROPOFOL;  Surgeon: [name], MD;  Location: [facility] ENDOSCOPY;  Service: Endoscopy;  Laterality: N/A;   FOOT SURGERY  2015   GALLBLADDER SURGERY  2008   HARDWARE REMOVAL Left 02/14/2016   Procedure: LEFT FOOT REMOVAL DEEP IMPLANT;  Surgeon: [name], MD;  Location: [place];  Service: Orthopedics;  Laterality: Left;   HERNIA REPAIR     Inguinal Hernia Repair   SPINE SURGERY  2014   Family History  Problem Relation Age of Onset   Arthritis Mother    Hypertension Mother    Macular degeneration Mother    Hypertension Father    Hyperlipidemia Father    Drug abuse Maternal Aunt    Heart disease Maternal Grandfather    Diabetes Maternal Grandfather    Kidney disease Paternal Grandmother    Social History   Socioeconomic History   Marital status: Married    Spouse name: Engineer, technical sales   Number of children: 3   Years of education: 14   Highest education level: Associate degree: academic program  Occupational History   Not  on file  Tobacco Use   Smoking status: Never   Smokeless tobacco: Never  Vaping Use   Vaping Use: Never used  Substance and Sexual Activity   Alcohol use: No    Alcohol/week: 0.0 standard drinks of alcohol   Drug use: No   Sexual activity: Not Currently  Other Topics Concern   Not on file  Social History Narrative   Lives with husband    Right handed   Caffeine: 1 cup of coffee in the AM, coke occa. Trying to come off caffeine    Social Determinants of Health   Financial Resource Strain: Low Risk  (01/07/2022)   Overall Financial Resource Strain (CARDIA)    Difficulty of Paying Living Expenses: Not hard at all  Food Insecurity: No Food Insecurity (12/15/2022)   Hunger Vital Sign    Worried About Running Out of Food in the Last Year: Never true    Ran Out of Food in the Last Year: Never true  Transportation Needs: No Transportation Needs (12/15/2022)   PRAPARE - Hydrologist (Medical): No    Lack of Transportation (Non-Medical): No  Physical Activity: Insufficiently Active (01/07/2022)   Exercise Vital Sign    Days of Exercise per  Week: 2 days    Minutes of Exercise per Session: 60 min  Stress: No Stress Concern Present (01/07/2022)   [place]    Feeling of Stress : Not at all  Social Connections: Unknown (01/07/2022)   Social Connection and Isolation Panel [NHANES]    Frequency of Communication with Friends and Family: Not on file    Frequency of Social Gatherings with Friends and Family: Not on file    Attends Religious Services: Not on file    Active Member of Clubs or Organizations: Not on file    Attends Archivist Meetings: Not on file    Marital Status: Married     Review of Systems     Objective:     Ht '5\' 3"'$  (1.6 m)   Wt 193 lb (87.5 kg)   BMI 34.19 kg/m  Wt Readings from Last 3 Encounters:  12/24/22 193 lb (87.5 kg)  12/15/22 193 lb (87.5 kg)   11/11/22 190 lb (86.2 kg)    Physical Exam   Outpatient Encounter Medications as of 12/24/2022  Medication Sig   amLODipine (NORVASC) 5 MG tablet TAKE 1 TABLET BY MOUTH DAILY.   baclofen (LIORESAL) 10 MG tablet Takes '10mg'$  q 4 hours   buPROPion (WELLBUTRIN XL) 150 MG 24 hr tablet Take 1 tablet (150 mg total) by mouth daily.   busPIRone (BUSPAR) 15 MG tablet TAKE 1 TABLET BY MOUTH 3 TIMES DAILY   cyanocobalamin (,VITAMIN B-12,) 1000 MCG/ML injection Inject 1 mL (1,000 mcg total) into the muscle every 30 (thirty) days.   dalfampridine 10 MG TB12 Take 1 tablet (10 mg total) by mouth every 12 (twelve) hours.   divalproex (DEPAKOTE ER) 500 MG 24 hr tablet Take 1 tablet (500 mg total) by mouth at bedtime.   docusate sodium (COLACE) 100 MG capsule Take 100 mg by mouth 2 (two) times daily.   fluticasone (FLONASE) 50 MCG/ACT nasal spray Place 1 spray into both nostrils daily.   furosemide (LASIX) 20 MG tablet Take 20 mg by mouth daily. (Patient not taking: Reported on 12/21/2022)   gabapentin (NEURONTIN) 100 MG capsule Take '200mg'$  in the morning, '200mg'$  in the afternoon and '200mg'$  at bedtime.   L-Methylfolate 15 MG TABS Take 1 tablet (15 mg total) by mouth daily.   loratadine (CLARITIN) 10 MG tablet Take 10 mg by mouth daily.   LORazepam (ATIVAN) 0.5 MG tablet TAKE 1 TABLET BY MOUTH EVERY 8 HOURS AS DIRECTED   losartan (COZAAR) 50 MG tablet TAKE 1 TABLET BY MOUTH DAILY   MAGNESIUM GLYCINATE PO Take 1 tablet by mouth daily.   METAMUCIL FIBER PO Take 400 mg by mouth every evening.   nystatin cream (MYCOSTATIN) Apply 1 application topically 2 (two) times daily.   ondansetron (ZOFRAN) 4 MG tablet Take 1 tablet (4 mg total) by mouth every 8 (eight) hours as needed for nausea or vomiting.   pantoprazole (PROTONIX) 40 MG tablet TWICE DAILY AT 7AM AND 5PM   rosuvastatin (CRESTOR) 5 MG tablet TAKE 1 TABLET BY MOUTH EVERY MONDAY, WEDNESDAY AND FRIDAY.   saccharomyces boulardii (FLORASTOR) 250 MG capsule Take 1  capsule (250 mg total) by mouth 2 (two) times daily.   sertraline (ZOLOFT) 100 MG tablet Take 150 mg by mouth daily.   sodium chloride (OCEAN) 0.65 % nasal spray Place 1-2 sprays into the nose as needed.   ticagrelor (BRILINTA) 90 MG TABS tablet Take 1 tablet (90 mg total) by mouth 2 (  two) times daily.   Vitamin D, Ergocalciferol, (DRISDOL) 1.25 MG (50000 UNIT) CAPS capsule Take 1 capsule (50,000 Units total) by mouth every 7 (seven) days.   [DISCONTINUED] chlorpheniramine-HYDROcodone (TUSSIONEX) 10-8 MG/5ML Take 5 mLs by mouth at bedtime as needed for cough.   [DISCONTINUED] Cholecalciferol (D3-1000 PO) Take 2,000 Units by mouth daily.   [DISCONTINUED] Coenzyme Q10 (COQ10 PO) Take 1 Capful by mouth daily.   Facility-Administered Encounter Medications as of 12/24/2022  Medication   0.9 %  sodium chloride infusion   acetaminophen (TYLENOL) tablet 650 mg     Lab Results  Component Value Date   WBC 12.8 (H) 12/21/2022   HGB 12.7 12/21/2022   HCT 39.6 12/21/2022   PLT 282 12/21/2022   GLUCOSE 103 (H) 12/21/2022   CHOL 154 03/07/2022   TRIG 160 (H) 03/07/2022   HDL 69 03/07/2022   LDLDIRECT 127.0 08/17/2020   LDLCALC 58 03/07/2022   ALT 17 12/21/2022   AST 17 12/21/2022   NA 137 12/21/2022   K 3.2 (L) 12/21/2022   CL 103 12/21/2022   CREATININE 0.59 12/21/2022   BUN 11 12/21/2022   CO2 26 12/21/2022   TSH 2.118 10/28/2022   INR 1.1 12/21/2022   HGBA1C 5.8 (H) 03/07/2022    MR BRAIN WO CONTRAST  Result Date: 12/21/2022 CLINICAL DATA:  Slurred speech and left-sided facial droop EXAM: MRI HEAD WITHOUT CONTRAST TECHNIQUE: Multiplanar, multiecho pulse sequences of the brain and surrounding structures were obtained without intravenous contrast. COMPARISON:  12/14/2022 FINDINGS: Brain: Unchanged appearance of subacute infarct at the lateral right thalamus. No new site of acute ischemia. Multiple chronic microhemorrhages in a nonspecific distribution. There is confluent hyperintense  T2-weighted signal within the white matter. Generalized volume loss. The midline structures are normal. Vascular: Normal flow voids. Skull and upper cervical spine: Normal marrow signal. Sinuses/Orbits: Negative. Other: None. IMPRESSION: 1. Unchanged appearance of subacute infarct at the lateral right thalamus. No new site of acute ischemia. 2. Multiple chronic microhemorrhages in a nonspecific distribution. 3. Severe chronic white matter disease which is likely a combination of chronic ischemic microangiopathy and chronic demyelination. Electronically Signed   By: Ulyses Jarred M.D.   On: 12/21/2022 21:57   CT ANGIO HEAD NECK W WO CM W PERF (CODE STROKE)  Result Date: 12/21/2022 CLINICAL DATA:  Acute neurologic deficit EXAM: CT ANGIOGRAPHY HEAD AND NECK CT PERFUSION BRAIN TECHNIQUE: Multidetector CT imaging of the head and neck was performed using the standard protocol during bolus administration of intravenous contrast. Multiplanar CT image reconstructions and MIPs were obtained to evaluate the vascular anatomy. Carotid stenosis measurements (when applicable) are obtained utilizing NASCET criteria, using the distal internal carotid diameter as the denominator. Multiphase CT imaging of the brain was performed following IV bolus contrast injection. Subsequent parametric perfusion maps were calculated using RAPID software. RADIATION DOSE REDUCTION: This exam was performed according to the departmental dose-optimization program which includes automated exposure control, adjustment of the mA and/or kV according to patient size and/or use of iterative reconstruction technique. CONTRAST:  161m OMNIPAQUE IOHEXOL 350 MG/ML SOLN COMPARISON:  None Available. FINDINGS: CTA NECK FINDINGS SKELETON: There is no bony spinal canal stenosis. No lytic or blastic lesion. OTHER NECK: Normal pharynx, larynx and major salivary glands. No cervical lymphadenopathy. Unremarkable thyroid gland. UPPER CHEST: No pneumothorax or pleural  effusion. No nodules or masses. AORTIC ARCH: There is calcific atherosclerosis of the aortic arch. There is no aneurysm, dissection or hemodynamically significant stenosis of the visualized portion of the aorta.  Conventional 3 vessel aortic branching pattern. The visualized proximal subclavian arteries are widely patent. RIGHT CAROTID SYSTEM: Normal without aneurysm, dissection or stenosis. LEFT CAROTID SYSTEM: Normal without aneurysm, dissection or stenosis. VERTEBRAL ARTERIES: Left dominant configuration. Both origins are clearly patent. There is no dissection, occlusion or flow-limiting stenosis to the skull base (V1-V3 segments). CTA HEAD FINDINGS POSTERIOR CIRCULATION: --Vertebral arteries: Normal V4 segments. --Inferior cerebellar arteries: Normal. --Basilar artery: Normal. --Superior cerebellar arteries: Normal. --Posterior cerebral arteries (PCA): Bilateral atherosclerotic irregularity without high-grade stenosis. ANTERIOR CIRCULATION: --Intracranial internal carotid arteries: Normal. --Anterior cerebral arteries (ACA): Normal. Absent left A1 segment, normal variant --Middle cerebral arteries (MCA): Bilateral atherosclerotic irregularity without high-grade stenosis. VENOUS SINUSES: As permitted by contrast timing, patent. ANATOMIC VARIANTS: None Review of the MIP images confirms the above findings. CT Brain Perfusion Findings: ASPECTS: 10 CBF (<30%) Volume: 77m Perfusion (Tmax>6.0s) volume: 018mMismatch Volume: 38m738mnfarction Location:None IMPRESSION: 1. No emergent large vessel occlusion or high-grade stenosis of the intracranial arteries. 2. No core infarct or penumbra demonstrated by CT perfusion. Aortic atherosclerosis (ICD10-I70.0). Electronically Signed   By: KevUlyses JarredD.   On: 12/21/2022 19:46   CT HEAD CODE STROKE WO CONTRAST  Result Date: 12/21/2022 CLINICAL DATA:  Code stroke.  Slurred speech EXAM: CT HEAD WITHOUT CONTRAST TECHNIQUE: Contiguous axial images were obtained from the base of  the skull through the vertex without intravenous contrast. RADIATION DOSE REDUCTION: This exam was performed according to the departmental dose-optimization program which includes automated exposure control, adjustment of the mA and/or kV according to patient size and/or use of iterative reconstruction technique. COMPARISON:  None Available. FINDINGS: Brain: There is no mass, hemorrhage or extra-axial collection. There is generalized atrophy without lobar predilection. There is hypoattenuation of the periventricular white matter, most commonly indicating chronic ischemic microangiopathy. Vascular: Atherosclerotic calcification of the vertebral and internal carotid arteries at the skull base. No abnormal hyperdensity of the major intracranial arteries or dural venous sinuses. Skull: The visualized skull base, calvarium and extracranial soft tissues are normal. Sinuses/Orbits: No fluid levels or advanced mucosal thickening of the visualized paranasal sinuses. No mastoid or middle ear effusion. The orbits are normal. ASPECTS ([facility] Program Early CT Score) - Ganglionic level infarction (caudate, lentiform nuclei, internal capsule, insula, M1-M3 cortex): 7 - Supraganglionic infarction (M4-M6 cortex): 3 Total score (0-10 with 10 being normal): 10 IMPRESSION: 1. No acute intracranial abnormality. 2. Severe chronic white matter disease. 3. ASPECTS is 10. These results were called by telephone at the time of interpretation on 12/21/2022 at 7:21 pm to provider [facility] Hospitalwho verbally acknowledged these results. Electronically Signed   By: KevUlyses JarredD.   On: 12/21/2022 19:21       Assessment & Plan:  There are no diagnoses linked to this encounter.   ChaEinar PheasantD

## 2022-12-25 ENCOUNTER — Other Ambulatory Visit: Payer: Medicare Other | Admitting: Nurse Practitioner

## 2022-12-25 DIAGNOSIS — F32A Depression, unspecified: Secondary | ICD-10-CM | POA: Diagnosis not present

## 2022-12-25 DIAGNOSIS — Z743 Need for continuous supervision: Secondary | ICD-10-CM | POA: Diagnosis not present

## 2022-12-25 DIAGNOSIS — I10 Essential (primary) hypertension: Secondary | ICD-10-CM | POA: Diagnosis not present

## 2022-12-25 DIAGNOSIS — G35 Multiple sclerosis: Secondary | ICD-10-CM | POA: Diagnosis not present

## 2022-12-25 DIAGNOSIS — R404 Transient alteration of awareness: Secondary | ICD-10-CM | POA: Diagnosis not present

## 2022-12-25 DIAGNOSIS — G4733 Obstructive sleep apnea (adult) (pediatric): Secondary | ICD-10-CM | POA: Diagnosis not present

## 2022-12-25 DIAGNOSIS — K219 Gastro-esophageal reflux disease without esophagitis: Secondary | ICD-10-CM | POA: Diagnosis not present

## 2022-12-25 DIAGNOSIS — I69818 Other symptoms and signs involving cognitive functions following other cerebrovascular disease: Secondary | ICD-10-CM | POA: Diagnosis not present

## 2022-12-26 DIAGNOSIS — I69818 Other symptoms and signs involving cognitive functions following other cerebrovascular disease: Secondary | ICD-10-CM | POA: Diagnosis not present

## 2022-12-26 DIAGNOSIS — K219 Gastro-esophageal reflux disease without esophagitis: Secondary | ICD-10-CM | POA: Diagnosis not present

## 2022-12-26 DIAGNOSIS — G35 Multiple sclerosis: Secondary | ICD-10-CM | POA: Diagnosis not present

## 2022-12-26 DIAGNOSIS — I10 Essential (primary) hypertension: Secondary | ICD-10-CM | POA: Diagnosis not present

## 2022-12-26 DIAGNOSIS — G4733 Obstructive sleep apnea (adult) (pediatric): Secondary | ICD-10-CM | POA: Diagnosis not present

## 2022-12-26 DIAGNOSIS — F32A Depression, unspecified: Secondary | ICD-10-CM | POA: Diagnosis not present

## 2022-12-27 DIAGNOSIS — G35 Multiple sclerosis: Secondary | ICD-10-CM | POA: Diagnosis not present

## 2022-12-27 DIAGNOSIS — K219 Gastro-esophageal reflux disease without esophagitis: Secondary | ICD-10-CM | POA: Diagnosis not present

## 2022-12-27 DIAGNOSIS — I69818 Other symptoms and signs involving cognitive functions following other cerebrovascular disease: Secondary | ICD-10-CM | POA: Diagnosis not present

## 2022-12-27 DIAGNOSIS — F32A Depression, unspecified: Secondary | ICD-10-CM | POA: Diagnosis not present

## 2022-12-27 DIAGNOSIS — G4733 Obstructive sleep apnea (adult) (pediatric): Secondary | ICD-10-CM | POA: Diagnosis not present

## 2022-12-27 DIAGNOSIS — I10 Essential (primary) hypertension: Secondary | ICD-10-CM | POA: Diagnosis not present

## 2022-12-28 ENCOUNTER — Encounter: Payer: Self-pay | Admitting: Internal Medicine

## 2022-12-28 NOTE — Assessment & Plan Note (Signed)
Orders given for suppositories on a schedule if no bowel movement.

## 2022-12-28 NOTE — Assessment & Plan Note (Signed)
Continues on zoloft.  Discussed with hospice nurse regarding scheduled ativan.  Follow.

## 2022-12-28 NOTE — Assessment & Plan Note (Signed)
Discussed with hospice regarding schedule morphine.  Follow.  Orders given.

## 2022-12-28 NOTE — Assessment & Plan Note (Signed)
Continue crestor. Follow lipid panel and liver function tests.   

## 2022-12-28 NOTE — Assessment & Plan Note (Signed)
Continue amlodipine.  Follow pressures.  

## 2022-12-28 NOTE — Assessment & Plan Note (Signed)
CPAP if desired 

## 2022-12-28 NOTE — Assessment & Plan Note (Signed)
Follow metabolic panel and [id number].

## 2022-12-28 NOTE — Progress Notes (Signed)
Patient ID: Carly [name], female   DOB: 06-13-1951, 72 y.o.   MRN: [id number]   Virtual Visit via video Note  All issues noted in this document were discussed and addressed.  No physical exam was performed (except for noted visual exam findings with Video Visits).   I connected with Carly [name] by a video enabled telemedicine application and verified that I am speaking with the correct person using two identifiers. Location patient: home Location provider: work Persons participating in the virtual visit: patient, provider, pts husband and hospice nurse, Education officer, museum, caretaker.   The limitations, risks, security and privacy concerns of performing an evaluation and management service by video and the availability of in person appointments have been discussed.  It has also been discussed with the patient that there may be a patient responsible charge related to this service. The patient expressed understanding and agreed to proceed.   Reason for visit: hospital follow up.   HPI:   Discuss hospice    HPI Was initially admitted 12/14/22 - 12/18/22 - with 2 week history of weakness of the upper extremities and slurring of her speech as well as swelling of the right arm.  Also nasal congestion - no cough.    Respiratory viral panel negative.  Labs including CBC, CMP and troponin mostly unremarkable except for potassium of 3.4. Upper extremity venous Doppler negative for DVT. MRI cervical spine consistent with history of demyelinating disease, spinal stenosis and foraminal stenosis. MRI brain with and without contrast consistent with active demyelination, possible evolving subacute ischemic infarct difficult to exclude. Teleneurology recs: 5 days of IV Solu-Medrol 1 g, rehab consult. Hospitalist admitted patient for MS flare. Completed 3 days of steroids.  Further history taken - concern more sudden onset of left arm weakness c/w CVA > MS.  Echo no concerns. Changed plavix to ticagrelor.  Stopped topamax, continued depakote. Discharged.  Returned to ER 12/21/22 - when she was noted that she had change of speech and had worsening weakness, leaning to 1 side when trying to use her lift. Neurology consulted.  MRI - no acute stroke.  Recommended discharge home and continue brilinta.  Since being home, she has been bed bound.  Can only sit to stand with lift. Discussion with hospice regarding hoyer lift.  Some constipation.  Orders given for suppositories.  Reports some increased neck and shoulder pain with headache.  Received order for morphine.  Has helped.  Occasional cough with drinking.  Have to encourage her to eat and take her medication.  Nurse to call back and we will adjust medications - either stopping or converting to sprinkles or crushed form.  No vomiting.  Family desires hospice care.    ROS: See pertinent positives and negatives per HPI.  Past Medical History:  Diagnosis Date   Allergy    Anxiety    Aspiration pneumonia ([facility])    Depression    Frequent headaches    H/O   GERD (gastroesophageal reflux disease)    History of chicken pox    History of colon polyps    Hx of migraines    Hypertension    Lymphedema    Multiple sclerosis ([place]) 2011   OSA (obstructive sleep apnea)    PONV (postoperative nausea and vomiting)    Scoliosis     Past Surgical History:  Procedure Laterality Date   BACK SURGERY     CHOLECYSTECTOMY     COLONOSCOPY N/A 04/23/2022   Procedure: COLONOSCOPY;  Surgeon: Ritzville, Graceham,  MD;  Location: [facility] ENDOSCOPY;  Service: Gastroenterology;  Laterality: N/A;   COLONOSCOPY WITH PROPOFOL N/A 05/18/2017   Procedure: COLONOSCOPY WITH PROPOFOL;  Surgeon: [name], MD;  Location: [facility] ENDOSCOPY;  Service: Endoscopy;  Laterality: N/A;   FOOT SURGERY  2015   GALLBLADDER SURGERY  2008   HARDWARE REMOVAL Left 02/14/2016   Procedure: LEFT FOOT REMOVAL DEEP IMPLANT;  Surgeon: [name], MD;  Location: [place];  Service:  Orthopedics;  Laterality: Left;   HERNIA REPAIR     Inguinal Hernia Repair   SPINE SURGERY  2014    Family History  Problem Relation Age of Onset   Arthritis Mother    Hypertension Mother    Macular degeneration Mother    Hypertension Father    Hyperlipidemia Father    Drug abuse Maternal Aunt    Heart disease Maternal Grandfather    Diabetes Maternal Grandfather    Kidney disease Paternal Grandmother     SOCIAL HX: reviewed.    Current Outpatient Medications:    amLODipine (NORVASC) 5 MG tablet, TAKE 1 TABLET BY MOUTH DAILY., Disp: 90 tablet, Rfl: 1   baclofen (LIORESAL) 10 MG tablet, Takes 10mg  q 4 hours, Disp: 180 each, Rfl: 5   buPROPion (WELLBUTRIN XL) 150 MG 24 hr tablet, Take 1 tablet (150 mg total) by mouth daily., Disp: 30 tablet, Rfl: 1   busPIRone (BUSPAR) 15 MG tablet, TAKE 1 TABLET BY MOUTH 3 TIMES DAILY, Disp: 90 tablet, Rfl: 1   cyanocobalamin (,VITAMIN B-12,) 1000 MCG/ML injection, Inject 1 mL (1,000 mcg total) into the muscle every 30 (thirty) days., Disp: 1 mL, Rfl: 3   dalfampridine 10 MG TB12, Take 1 tablet (10 mg total) by mouth every 12 (twelve) hours., Disp: 180 tablet, Rfl: 3   divalproex (DEPAKOTE ER) 500 MG 24 hr tablet, Take 1 tablet (500 mg total) by mouth at bedtime., Disp: 30 tablet, Rfl: 0   docusate sodium (COLACE) 100 MG capsule, Take 100 mg by mouth 2 (two) times daily., Disp: , Rfl:    fluticasone (FLONASE) 50 MCG/ACT nasal spray, Place 1 spray into both nostrils daily., Disp: , Rfl:    gabapentin (NEURONTIN) 100 MG capsule, Take 200mg  in the morning, 200mg  in the afternoon and 200mg  at bedtime., Disp: 180 capsule, Rfl: 5   loratadine (CLARITIN) 10 MG tablet, Take 10 mg by mouth daily., Disp: , Rfl:    LORazepam (ATIVAN) 0.5 MG tablet, TAKE 1 TABLET BY MOUTH EVERY 8 HOURS AS DIRECTED, Disp: 90 tablet, Rfl: 3   losartan (COZAAR) 50 MG tablet, TAKE 1 TABLET BY MOUTH DAILY, Disp: 90 tablet, Rfl: 1   MAGNESIUM GLYCINATE PO, Take 1 tablet by mouth  daily., Disp: , Rfl:    METAMUCIL FIBER PO, Take 400 mg by mouth every evening., Disp: , Rfl:    nystatin cream (MYCOSTATIN), Apply 1 application topically 2 (two) times daily., Disp: 30 g, Rfl: 11   ondansetron (ZOFRAN) 4 MG tablet, Take 1 tablet (4 mg total) by mouth every 8 (eight) hours as needed for nausea or vomiting., Disp: 20 tablet, Rfl: 0   pantoprazole (PROTONIX) 40 MG tablet, TWICE DAILY AT 7AM AND 5PM, Disp: 62 tablet, Rfl: 1   rosuvastatin (CRESTOR) 5 MG tablet, TAKE 1 TABLET BY MOUTH EVERY MONDAY, WEDNESDAY AND FRIDAY., Disp: 90 tablet, Rfl: 1   saccharomyces boulardii (FLORASTOR) 250 MG capsule, Take 1 capsule (250 mg total) by mouth 2 (two) times daily., Disp: 60 capsule, Rfl: 0  sertraline (ZOLOFT) 100 MG tablet, Take 150 mg by mouth daily., Disp: , Rfl:    sodium chloride (OCEAN) 0.65 % nasal spray, Place 1-2 sprays into the nose as needed., Disp: , Rfl:    ticagrelor (BRILINTA) 90 MG TABS tablet, Take 1 tablet (90 mg total) by mouth 2 (two) times daily., Disp: 60 tablet, Rfl: 0   Vitamin D, Ergocalciferol, (DRISDOL) 1.25 MG (50000 UNIT) CAPS capsule, Take 1 capsule (50,000 Units total) by mouth every 7 (seven) days., Disp: 20 capsule, Rfl: 0 No current facility-administered medications for this visit.  Facility-Administered Medications Ordered in Other Visits:    0.9 %  sodium chloride infusion, , Intravenous, Continuous, Corcoran, Melissa C, MD, Last Rate: 10 mL/hr at 04/23/22 1127, Continued from Pre-op at 04/23/22 1127   acetaminophen (TYLENOL) tablet 650 mg, 650 mg, Oral, Once, Corcoran, Drue Second, MD  EXAM:  GENERAL: lying in be (hospital bed) - no acute distress.   HEENT: atraumatic, conjunttiva clear, no obvious abnormalities on inspection of external nose and ears  NECK: no significant pain with movement of the head and neck currently.   LUNGS: on inspection no signs of respiratory distress, breathing rate appears normal, no obvious gross SOB, gasping or  wheezing  CV: no obvious cyanosis  PSYCH/NEURO: pleasant and cooperative, no obvious depression or anxiety, speech and thought processing grossly intact  ASSESSMENT AND PLAN:  Discussed the following assessment and plan:  Problem List Items Addressed This Visit     OSA on CPAP    CPAP if desired       Multiple sclerosis ([place])    Followed by Dr Felecia Shelling.   Recent admission as outlined.  Received 3 days IV solumedrol.  Continue baclofen.  Planned f/u with Dr Felecia Shelling.       Hypertension    Continue amlodipine.  Follow pressures.        Hyperglycemia    Follow metabolic panel and [id number].      Hypercholesterolemia    Continue crestor.  Follow lipid panel and liver function tests.       History of CVA (cerebrovascular accident)    On brilinta now.  Follow.       Gait disturbance    Non ambulatory.  Hoyer lift.       Exacerbation of multiple sclerosis ([place])    Recent admission as outlined.  Received 3 days - solumedrol.  F/u MRI - no evolving stroke.  Planned f/u with Dr Felecia Shelling.       Edema    In hospital bed.  Legs elevated.  Not eating and drinking much.  Hold lasix for now.  Follow.       Depression, recurrent ([place])    Continue zoloft.  Lorazepam schedule as directed.  Follow.       DDD (degenerative disc disease), lumbosacral    Discussed with hospice regarding schedule morphine.  Follow.  Orders given.       Chronic constipation    Orders given for suppositories on a schedule if no bowel movement.        Anxiety and depression - Primary    Continues on zoloft.  Discussed with hospice nurse regarding scheduled ativan.  Follow.        Return if symptoms worsen or fail to improve.   I discussed the assessment and treatment plan with the patient. The patient was provided an opportunity to ask questions and all were answered. The patient agreed with the plan and demonstrated an understanding of the  instructions.   The patient was advised to call back or seek an  in-person evaluation if the symptoms worsen or if the condition fails to improve as anticipated.  Medications reconciled with hospice nurse.   Einar Pheasant, MD

## 2022-12-28 NOTE — Assessment & Plan Note (Signed)
Followed by Dr Felecia Shelling.   Recent admission as outlined.  Received 3 days IV solumedrol.  Continue baclofen.  Planned f/u with Dr Felecia Shelling.

## 2022-12-28 NOTE — Assessment & Plan Note (Signed)
Continue zoloft.  Lorazepam schedule as directed.  Follow.

## 2022-12-28 NOTE — Assessment & Plan Note (Signed)
Recent admission as outlined.  Received 3 days - solumedrol.  F/u MRI - no evolving stroke.  Planned f/u with Dr Felecia Shelling.

## 2022-12-28 NOTE — Assessment & Plan Note (Signed)
In hospital bed.  Legs elevated.  Not eating and drinking much.  Hold lasix for now.  Follow.

## 2022-12-28 NOTE — Assessment & Plan Note (Signed)
On brilinta now.  Follow.

## 2022-12-28 NOTE — Assessment & Plan Note (Signed)
Non ambulatory.  Hoyer lift.

## 2023-01-02 ENCOUNTER — Ambulatory Visit: Payer: Medicare Other | Admitting: Clinical

## 2023-01-05 ENCOUNTER — Telehealth: Payer: Self-pay | Admitting: Internal Medicine

## 2023-01-05 NOTE — Telephone Encounter (Signed)
Pt husband came into the office stating that the pt passed away last week

## 2023-01-05 NOTE — Telephone Encounter (Signed)
Spoke with patients husband. He came into office to let us know that his wife passed away and was requesting her medical records from [facility]. Discussed with front staff and confirmed he would have to sign a medical release to get her records that would be sent over. Husband decided not to sign the release to get the records.

## 2023-01-12 DEATH — deceased

## 2023-01-27 ENCOUNTER — Ambulatory Visit: Payer: Medicare Other | Admitting: Family Medicine

## 2023-02-09 ENCOUNTER — Ambulatory Visit: Payer: Medicare Other | Admitting: Psychiatry

## 2023-02-10 ENCOUNTER — Ambulatory Visit: Payer: Medicare Other | Admitting: Psychiatry

## 2023-02-16 ENCOUNTER — Ambulatory Visit: Payer: Medicare Other | Admitting: Physical Medicine and Rehabilitation

## 2023-02-20 NOTE — Progress Notes (Signed)
Activated code stroke and alcohol level is ordered in the code stroke panel.  Altered mental status with dysarthria, possible intoxication

## 2023-03-30 ENCOUNTER — Ambulatory Visit: Payer: Medicare Other | Admitting: Neurology

## 2023-08-10 ENCOUNTER — Ambulatory Visit: Payer: Medicare Other | Admitting: Psychology
